# Patient Record
Sex: Female | Born: 1950 | Race: White | Hispanic: No | Smoking: Former smoker
Health system: Southern US, Community
[De-identification: ages and names within clinical notes are randomized; demographics above are authoritative.]

## PROBLEM LIST (undated history)

## (undated) DIAGNOSIS — D510 Vitamin B12 deficiency anemia due to intrinsic factor deficiency: Secondary | ICD-10-CM

## (undated) DIAGNOSIS — K219 Gastro-esophageal reflux disease without esophagitis: Secondary | ICD-10-CM

## (undated) DIAGNOSIS — I7121 Aneurysm of the ascending aorta, without rupture: Secondary | ICD-10-CM

## (undated) DIAGNOSIS — M8580 Other specified disorders of bone density and structure, unspecified site: Secondary | ICD-10-CM

## (undated) DIAGNOSIS — C801 Malignant (primary) neoplasm, unspecified: Secondary | ICD-10-CM

## (undated) DIAGNOSIS — I10 Essential (primary) hypertension: Secondary | ICD-10-CM

## (undated) DIAGNOSIS — D75839 Thrombocytosis, unspecified: Secondary | ICD-10-CM

## (undated) DIAGNOSIS — D473 Essential (hemorrhagic) thrombocythemia: Secondary | ICD-10-CM

## (undated) DIAGNOSIS — E785 Hyperlipidemia, unspecified: Secondary | ICD-10-CM

## (undated) DIAGNOSIS — R002 Palpitations: Secondary | ICD-10-CM

## (undated) DIAGNOSIS — I209 Angina pectoris, unspecified: Secondary | ICD-10-CM

## (undated) DIAGNOSIS — E7800 Pure hypercholesterolemia, unspecified: Secondary | ICD-10-CM

## (undated) DIAGNOSIS — I2510 Atherosclerotic heart disease of native coronary artery without angina pectoris: Secondary | ICD-10-CM

## (undated) DIAGNOSIS — I509 Heart failure, unspecified: Secondary | ICD-10-CM

## (undated) DIAGNOSIS — C439 Malignant melanoma of skin, unspecified: Secondary | ICD-10-CM

## (undated) DIAGNOSIS — R0600 Dyspnea, unspecified: Secondary | ICD-10-CM

## (undated) DIAGNOSIS — I7300 Raynaud's syndrome without gangrene: Secondary | ICD-10-CM

## (undated) DIAGNOSIS — E559 Vitamin D deficiency, unspecified: Secondary | ICD-10-CM

## (undated) DIAGNOSIS — Z8719 Personal history of other diseases of the digestive system: Secondary | ICD-10-CM

## (undated) DIAGNOSIS — M1990 Unspecified osteoarthritis, unspecified site: Secondary | ICD-10-CM

## (undated) DIAGNOSIS — J439 Emphysema, unspecified: Secondary | ICD-10-CM

## (undated) DIAGNOSIS — C50912 Malignant neoplasm of unspecified site of left female breast: Secondary | ICD-10-CM

## (undated) DIAGNOSIS — I712 Thoracic aortic aneurysm, without rupture: Secondary | ICD-10-CM

## (undated) DIAGNOSIS — R079 Chest pain, unspecified: Secondary | ICD-10-CM

## (undated) DIAGNOSIS — D126 Benign neoplasm of colon, unspecified: Secondary | ICD-10-CM

## (undated) HISTORY — PX: MASTECTOMY: SHX3

## (undated) HISTORY — DX: Chest pain, unspecified: R07.9

## (undated) HISTORY — PX: ABDOMINAL HYSTERECTOMY: SHX81

## (undated) HISTORY — PX: APPENDECTOMY: SHX54

## (undated) HISTORY — DX: Malignant neoplasm of unspecified site of left female breast: C50.912

## (undated) HISTORY — DX: Thoracic aortic aneurysm, without rupture: I71.2

## (undated) HISTORY — DX: Pure hypercholesterolemia, unspecified: E78.00

## (undated) HISTORY — DX: Vitamin B12 deficiency anemia due to intrinsic factor deficiency: D51.0

## (undated) HISTORY — PX: TUBAL LIGATION: SHX77

## (undated) HISTORY — DX: Palpitations: R00.2

## (undated) HISTORY — PX: BREAST SURGERY: SHX581

## (undated) HISTORY — DX: Emphysema, unspecified: J43.9

## (undated) HISTORY — PX: OVARIAN CYST SURGERY: SHX726

## (undated) HISTORY — PX: CARDIAC CATHETERIZATION: SHX172

## (undated) HISTORY — PX: BACK SURGERY: SHX140

## (undated) HISTORY — DX: Essential (primary) hypertension: I10

## (undated) HISTORY — DX: Malignant melanoma of skin, unspecified: C43.9

## (undated) HISTORY — DX: Thrombocytosis, unspecified: D75.839

## (undated) HISTORY — DX: Essential (hemorrhagic) thrombocythemia: D47.3

## (undated) HISTORY — DX: Aneurysm of the ascending aorta, without rupture: I71.21

---

## 1993-04-16 DIAGNOSIS — C801 Malignant (primary) neoplasm, unspecified: Secondary | ICD-10-CM

## 1993-04-16 HISTORY — DX: Malignant (primary) neoplasm, unspecified: C80.1

## 2000-10-21 ENCOUNTER — Ambulatory Visit (HOSPITAL_COMMUNITY): Admission: RE | Admit: 2000-10-21 | Discharge: 2000-10-21 | Payer: Self-pay | Admitting: Family Medicine

## 2000-10-21 ENCOUNTER — Encounter: Payer: Self-pay | Admitting: Family Medicine

## 2000-12-13 ENCOUNTER — Encounter: Admission: RE | Admit: 2000-12-13 | Discharge: 2000-12-13 | Payer: Self-pay | Admitting: Oncology

## 2001-12-12 ENCOUNTER — Encounter: Admission: RE | Admit: 2001-12-12 | Discharge: 2001-12-12 | Payer: Self-pay | Admitting: Oncology

## 2002-05-13 ENCOUNTER — Ambulatory Visit (HOSPITAL_COMMUNITY): Admission: RE | Admit: 2002-05-13 | Discharge: 2002-05-13 | Payer: Self-pay | Admitting: Podiatry

## 2002-07-29 ENCOUNTER — Ambulatory Visit (HOSPITAL_COMMUNITY): Admission: RE | Admit: 2002-07-29 | Discharge: 2002-07-29 | Payer: Self-pay | Admitting: Oncology

## 2002-07-29 ENCOUNTER — Encounter (HOSPITAL_COMMUNITY): Payer: Self-pay | Admitting: Oncology

## 2002-12-14 ENCOUNTER — Encounter: Admission: RE | Admit: 2002-12-14 | Discharge: 2002-12-14 | Payer: Self-pay | Admitting: Oncology

## 2003-03-05 ENCOUNTER — Encounter: Admission: RE | Admit: 2003-03-05 | Discharge: 2003-03-05 | Payer: Self-pay | Admitting: Oncology

## 2003-08-09 ENCOUNTER — Encounter: Admission: RE | Admit: 2003-08-09 | Discharge: 2003-08-09 | Payer: Self-pay | Admitting: Oncology

## 2003-08-09 ENCOUNTER — Encounter (HOSPITAL_COMMUNITY): Admission: RE | Admit: 2003-08-09 | Discharge: 2003-09-08 | Payer: Self-pay | Admitting: Oncology

## 2003-09-15 ENCOUNTER — Ambulatory Visit (HOSPITAL_COMMUNITY): Admission: RE | Admit: 2003-09-15 | Discharge: 2003-09-15 | Payer: Self-pay | Admitting: Podiatry

## 2004-03-13 ENCOUNTER — Encounter (HOSPITAL_COMMUNITY): Admission: RE | Admit: 2004-03-13 | Discharge: 2004-04-12 | Payer: Self-pay | Admitting: Oncology

## 2004-03-13 ENCOUNTER — Encounter: Admission: RE | Admit: 2004-03-13 | Discharge: 2004-03-13 | Payer: Self-pay | Admitting: Oncology

## 2004-03-13 ENCOUNTER — Ambulatory Visit (HOSPITAL_COMMUNITY): Payer: Self-pay | Admitting: Oncology

## 2004-04-14 ENCOUNTER — Ambulatory Visit: Payer: Self-pay | Admitting: Internal Medicine

## 2004-04-14 ENCOUNTER — Ambulatory Visit (HOSPITAL_COMMUNITY): Admission: RE | Admit: 2004-04-14 | Discharge: 2004-04-14 | Payer: Self-pay | Admitting: Internal Medicine

## 2004-09-25 ENCOUNTER — Encounter (HOSPITAL_COMMUNITY): Admission: RE | Admit: 2004-09-25 | Discharge: 2004-10-25 | Payer: Self-pay | Admitting: Oncology

## 2004-09-25 ENCOUNTER — Encounter: Admission: RE | Admit: 2004-09-25 | Discharge: 2004-09-25 | Payer: Self-pay | Admitting: Oncology

## 2004-11-16 ENCOUNTER — Ambulatory Visit: Payer: Self-pay | Admitting: Occupational Therapy

## 2005-03-20 ENCOUNTER — Ambulatory Visit (HOSPITAL_COMMUNITY): Payer: Self-pay | Admitting: Oncology

## 2005-03-20 ENCOUNTER — Encounter: Admission: RE | Admit: 2005-03-20 | Discharge: 2005-03-20 | Payer: Self-pay | Admitting: Oncology

## 2005-09-27 ENCOUNTER — Encounter (HOSPITAL_COMMUNITY): Admission: RE | Admit: 2005-09-27 | Discharge: 2005-10-27 | Payer: Self-pay | Admitting: Internal Medicine

## 2005-09-27 ENCOUNTER — Encounter: Admission: RE | Admit: 2005-09-27 | Discharge: 2005-09-27 | Payer: Self-pay | Admitting: Oncology

## 2005-10-10 ENCOUNTER — Encounter (INDEPENDENT_AMBULATORY_CARE_PROVIDER_SITE_OTHER): Payer: Self-pay | Admitting: *Deleted

## 2005-10-10 ENCOUNTER — Ambulatory Visit (HOSPITAL_COMMUNITY): Admission: RE | Admit: 2005-10-10 | Discharge: 2005-10-10 | Payer: Self-pay | Admitting: Podiatry

## 2006-03-19 ENCOUNTER — Ambulatory Visit (HOSPITAL_COMMUNITY): Payer: Self-pay | Admitting: Oncology

## 2006-10-01 ENCOUNTER — Encounter (HOSPITAL_COMMUNITY): Admission: RE | Admit: 2006-10-01 | Discharge: 2006-10-31 | Payer: Self-pay | Admitting: Oncology

## 2007-03-19 ENCOUNTER — Ambulatory Visit (HOSPITAL_COMMUNITY): Payer: Self-pay | Admitting: Oncology

## 2007-10-07 ENCOUNTER — Encounter (HOSPITAL_COMMUNITY): Admission: RE | Admit: 2007-10-07 | Discharge: 2007-11-06 | Payer: Self-pay | Admitting: Oncology

## 2008-04-02 ENCOUNTER — Ambulatory Visit (HOSPITAL_COMMUNITY): Payer: Self-pay | Admitting: Oncology

## 2008-10-11 ENCOUNTER — Ambulatory Visit (HOSPITAL_COMMUNITY): Admission: RE | Admit: 2008-10-11 | Discharge: 2008-10-11 | Payer: Self-pay | Admitting: Oncology

## 2009-03-30 ENCOUNTER — Ambulatory Visit (HOSPITAL_COMMUNITY): Payer: Self-pay | Admitting: Oncology

## 2009-10-20 ENCOUNTER — Ambulatory Visit (HOSPITAL_COMMUNITY): Admission: RE | Admit: 2009-10-20 | Discharge: 2009-10-20 | Payer: Self-pay | Admitting: Oncology

## 2010-03-01 ENCOUNTER — Ambulatory Visit: Payer: Self-pay | Admitting: Genetic Counselor

## 2010-03-29 ENCOUNTER — Encounter (HOSPITAL_COMMUNITY)
Admission: RE | Admit: 2010-03-29 | Discharge: 2010-04-28 | Payer: Self-pay | Source: Home / Self Care | Attending: Oncology | Admitting: Oncology

## 2010-03-29 ENCOUNTER — Ambulatory Visit (HOSPITAL_COMMUNITY): Payer: Self-pay | Admitting: Oncology

## 2010-04-20 ENCOUNTER — Ambulatory Visit (HOSPITAL_COMMUNITY)
Admission: RE | Admit: 2010-04-20 | Discharge: 2010-04-20 | Payer: Self-pay | Source: Home / Self Care | Attending: Internal Medicine | Admitting: Internal Medicine

## 2010-04-20 ENCOUNTER — Ambulatory Visit: Admit: 2010-04-20 | Payer: Self-pay | Admitting: Internal Medicine

## 2010-05-07 ENCOUNTER — Encounter (HOSPITAL_COMMUNITY): Payer: Self-pay | Admitting: Oncology

## 2010-09-01 NOTE — H&P (Signed)
Ashley [name]                   ACCOUNT NO.:  [id number]   MEDICAL RECORD NO.:  [id number]          PATIENT TYPE:  AMB   LOCATION:  DAY                           FACILITY:  [facility]   PHYSICIAN:  Cody M. Ulice Brilliant, D.P.M.  DATE OF BIRTH:  Feb 19, 1951   DATE OF ADMISSION:  DATE OF DISCHARGE:  LH                                HISTORY & PHYSICAL   HISTORY OF PRESENT ILLNESS:  Ashley [name] has a painful lump which has developed on  the dorsal aspect of her right foot over the first metatarsal just proximal  to the first MTP.  This is the site of a previous bunion correction.   PAST MEDICAL HISTORY:  Significant for hypertension for which she takes  Imdur, Cardizem and Avalide.   SHE HAS NO KNOWN DRUG ALLERGIES.   OBJECTIVE:  As stated, about 1-2 cm proximal to the MTP she has what feels  like a ganglionic cyst.  However, the quality of the tissue does not feel  fluid-filled.  It feels relatively hardened.   Radiographs do reveal that one of the K-wires is back out approximately 5-6  mm.   ASSESSMENT:  I think she has likely got an adventitious bursa or ganglion  from the internal fixation which has backed out.   PLAN:  As stated, the above quality of the tissues does not suggest that  this would be amenable to aspiration.  I do not think that would make any  sense whatsoever.  I think her best bet is to have the area opened and the  ganglion or bursa removed as well as removing the internal fixation.  Ashley [name]  asked if we could do that in the office and that is not a possibility.  Will  need to do this under some sedation, monitored anesthesia care at [facility].  We have discussed the procedure and the usual postoperative course.  We will do this through the original skin incision.  Ashley [name] has read the  consent form, apparently understood and signed.                                            ______________________________  Denny Peon. Ulice Brilliant, D.P.M.     CMD/MEDQ  D:  10/09/2005  T:  10/09/2005   Job:  [id number]

## 2010-09-01 NOTE — Op Note (Signed)
NAMEJAMILA, Ashley [name]                   ACCOUNT NO.:  [id number]   MEDICAL RECORD NO.:  [id number]          PATIENT TYPE:  AMB   LOCATION:  DAY                           FACILITY:  [facility]   PHYSICIAN:  Cody M. Ulice Brilliant, D.P.M.  DATE OF BIRTH:  1950/10/07   DATE OF PROCEDURE:  10/10/2005  DATE OF DISCHARGE:                                 OPERATIVE REPORT   PREOPERATIVE DIAGNOSIS:  Ganglionic cyst/adventitious bursa of first  metatarsophalangeal joint dorsally, right foot.  Symptomatic internal  fixation, first metatarsophalangeal joint, right.   POSTOPERATIVE DIAGNOSIS:  Ganglionic cyst/adventitious bursa of first  metatarsophalangeal joint dorsally, right foot.  Symptomatic internal  fixation, first metatarsophalangeal joint, right.   OPERATION PERFORMED:  Excision of ganglion from dorsal aspect of first  metatarsal right foot and removal of symptomatic internal fixation, right  foot.   SURGEON:  Denny Peon. Ulice Brilliant, D.P.M.   ANESTHESIA:  Monitored anesthesia care.   INDICATIONS FOR PROCEDURE:  The patient has had a previous [place]  bunionectomy of the right foot and about a month to two months ago she  started having increased swelling and discomfort along the dorsal aspect of  the foot.  Radiographs were taken and she was noted to have a K-wire which  has worked its way out proximally and is causing a source of irritation.  Decision was made to go ahead and remove the K-wires and take out the bursa  or ganglion.   OPERATIVE FINDINGS:  The patient had an enlarged ganglion which seems to be  originating from one of the two K-wires which has become prominent dorsally.   DESCRIPTION OF PROCEDURE:  Ashley [name] is brought to the operating room and placed  on the table in the supine position.  IV sedation is established.  A Mayo  block was performed about the first MTP of the right foot proximal to the  planned incisional site.  A pneumatic ankle tourniquet was applied across  the right ankle.  Her  foot was prepped and draped in the usual aseptic  fashion.  An Ace bandage was utilized to exsanguinate her foot.  Tourniquet  inflated to 250 mmHg.   PROCEDURE #1  Excision of ganglionic cyst of first metatarsal, right foot.  Attention was directed to the above noted first metatarsal.  The ganglion  was appreciated dorsally.  A 5 cm skin incision was made along the original  skin incision.  The incision was deepened by blunt dissection through  subcutaneous tissues.  The ganglion is appreciated and it is dissected free  from the wound.  Following removal of the ganglion, the K-wire is  appreciated.   PROCEDURE #2  Removal of symptomatic internal fixation.  The first K-wire  was well visualized and was removed with straight hemostat.  The second K-  wire is identified utilizing the [place] and a Therapist, nutritional.  A deep  fascial incision was made into the deep fascia and periosteum.  The K-wire  is identified and removed.  Two horizontal mattress sutures of 3-0 Vicryl  was utilized to close deep fascia.  The  wound was flushed and subcutaneous  tissues were reapproximated and closed with 4-0 Vicryl in a running  horizontal mattress suture.  Skin is closed with 4-0 Vicryl in a running  subcuticular suture.  Postoperative dressing of Steri-Strips.  Postoperative  injection of 0.5 mL of DMP and 2 mL of Marcaine.  Betadine soaked Adaptic  dressing, dry sterile compressive dressing follows.  Tourniquet was deflated  with tourniquet time spanning roughly 25 minutes.   Ashley [name] tolerated the anesthesia and procedure well.  She is transported to  the day hospital.  While there, a list of written instructions were  explained to her.  A prescription for hydrocodone 5/500 and Phenergan is  dispensed.  She will be seen within six days for first postoperative visit.           ______________________________  Denny Peon. Ulice Brilliant, D.P.M.     CMD/MEDQ  D:  10/10/2005  T:  10/10/2005  Job:  [id number]

## 2010-10-04 ENCOUNTER — Other Ambulatory Visit (HOSPITAL_COMMUNITY): Payer: Self-pay | Admitting: Oncology

## 2010-10-04 DIAGNOSIS — Z139 Encounter for screening, unspecified: Secondary | ICD-10-CM

## 2010-10-23 ENCOUNTER — Ambulatory Visit (HOSPITAL_COMMUNITY)
Admission: RE | Admit: 2010-10-23 | Discharge: 2010-10-23 | Disposition: A | Discharge status: Home / Self Care | Payer: Managed Care, Other (non HMO) | Source: Ambulatory Visit | Attending: Oncology | Admitting: Oncology

## 2010-10-23 DIAGNOSIS — Z139 Encounter for screening, unspecified: Secondary | ICD-10-CM

## 2010-10-23 DIAGNOSIS — Z1231 Encounter for screening mammogram for malignant neoplasm of breast: Secondary | ICD-10-CM | POA: Insufficient documentation

## 2010-10-27 ENCOUNTER — Encounter (INDEPENDENT_AMBULATORY_CARE_PROVIDER_SITE_OTHER): Payer: Self-pay | Admitting: Internal Medicine

## 2010-11-06 ENCOUNTER — Encounter (INDEPENDENT_AMBULATORY_CARE_PROVIDER_SITE_OTHER): Payer: Self-pay | Admitting: *Deleted

## 2010-11-28 MED ORDER — SODIUM CHLORIDE 0.45 % IV SOLN
Freq: Once | INTRAVENOUS | Status: AC
  Administered 2010-11-29: 1000 mL via INTRAVENOUS

## 2010-11-29 ENCOUNTER — Other Ambulatory Visit (INDEPENDENT_AMBULATORY_CARE_PROVIDER_SITE_OTHER): Payer: Self-pay | Admitting: Internal Medicine

## 2010-11-29 ENCOUNTER — Encounter (HOSPITAL_COMMUNITY)
Admission: RE | Disposition: A | Discharge status: Home / Self Care | Payer: Self-pay | Source: Ambulatory Visit | Attending: Internal Medicine

## 2010-11-29 ENCOUNTER — Encounter (HOSPITAL_COMMUNITY): Payer: Self-pay | Admitting: *Deleted

## 2010-11-29 ENCOUNTER — Ambulatory Visit (HOSPITAL_COMMUNITY)
Admission: RE | Admit: 2010-11-29 | Discharge: 2010-11-29 | Disposition: A | Discharge status: Home / Self Care | Payer: Managed Care, Other (non HMO) | Source: Ambulatory Visit | Attending: Internal Medicine | Admitting: Internal Medicine

## 2010-11-29 DIAGNOSIS — D126 Benign neoplasm of colon, unspecified: Secondary | ICD-10-CM

## 2010-11-29 DIAGNOSIS — Z853 Personal history of malignant neoplasm of breast: Secondary | ICD-10-CM | POA: Insufficient documentation

## 2010-11-29 DIAGNOSIS — Z860100 Personal history of colon polyps, unspecified: Secondary | ICD-10-CM | POA: Insufficient documentation

## 2010-11-29 DIAGNOSIS — I10 Essential (primary) hypertension: Secondary | ICD-10-CM | POA: Insufficient documentation

## 2010-11-29 DIAGNOSIS — Z79899 Other long term (current) drug therapy: Secondary | ICD-10-CM | POA: Insufficient documentation

## 2010-11-29 DIAGNOSIS — Z800 Family history of malignant neoplasm of digestive organs: Secondary | ICD-10-CM

## 2010-11-29 DIAGNOSIS — K648 Other hemorrhoids: Secondary | ICD-10-CM | POA: Insufficient documentation

## 2010-11-29 DIAGNOSIS — Z7982 Long term (current) use of aspirin: Secondary | ICD-10-CM | POA: Insufficient documentation

## 2010-11-29 HISTORY — DX: Atherosclerotic heart disease of native coronary artery without angina pectoris: I25.10

## 2010-11-29 HISTORY — DX: Essential (primary) hypertension: I10

## 2010-11-29 HISTORY — DX: Malignant (primary) neoplasm, unspecified: C80.1

## 2010-11-29 HISTORY — PX: COLONOSCOPY: SHX5424

## 2010-11-29 HISTORY — DX: Hyperlipidemia, unspecified: E78.5

## 2010-11-29 HISTORY — DX: Benign neoplasm of colon, unspecified: D12.6

## 2010-11-29 SURGERY — COLONOSCOPY
Anesthesia: Moderate Sedation

## 2010-11-29 MED ORDER — MEPERIDINE HCL 50 MG/ML IJ SOLN
INTRAMUSCULAR | Status: DC | PRN
  Administered 2010-11-29: 20 mg via INTRAVENOUS

## 2010-11-29 MED ORDER — MEPERIDINE HCL 100 MG/ML IJ SOLN
INTRAMUSCULAR | Status: AC
  Filled 2010-11-29: qty 1

## 2010-11-29 MED ORDER — MIDAZOLAM HCL 5 MG/5ML IJ SOLN
INTRAMUSCULAR | Status: DC | PRN
  Administered 2010-11-29 (×2): 2 mg via INTRAVENOUS

## 2010-11-29 MED ORDER — MIDAZOLAM HCL 5 MG/5ML IJ SOLN
INTRAMUSCULAR | Status: AC
  Filled 2010-11-29: qty 5

## 2010-11-29 NOTE — Op Note (Signed)
COLONOSCOPY PROCEDURE REPORT  PATIENT:  Ashley [name]  MR#:  [id number] Birthdate:  1950-08-23, 60 y.o., female Endoscopist:  Dr. Malissa Hippo, MD Referred By:Ms. [name] NP Procedure Date: 11/29/2010  Procedure:   Colonoscopy  Indications:  History of colonic polyps. Her last exam she had an adenomas and 2 tubulovillous adenomas removed she was in January this year. 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 Her daughter has been treated for colorectal carcinoma.  Informed Consent: Procedure and risks were reviewed with the patient and informed consent is obtained  Medications:  Demerol 20  mg IV Versed 4  mg IV  Description of procedure:  After a digital rectal exam was performed, that colonoscope was advanced from the anus through the rectum and colon to the area of the cecum, ileocecal valve and appendiceal orifice. The cecum was deeply intubated. These structures were well-seen and photographed for the record. From the level of the cecum and ileocecal valve, the scope was slowly and cautiously withdrawn. The mucosal surfaces were carefully surveyed utilizing scope tip to flexion to facilitate fold flattening as needed. The scope was pulled down into the rectum where a thorough exam including retroflexion was performed.  Findings:  Prep excellent. 10 small polyps were coagulated using snare tip starting from cecum on down to the rectum.  9 mm sessile polyp snared from proximal transverse colon with saline injection. Another 6-7 mm sessile polyp snared from proximal sigmoid colon. Small external hemorrhoids and anal papilla.   Therapeutic/Diagnostic Maneuvers Performed:  See above  Complications:  None  Cecal Withdrawal Time:  24 minutes  Impression:  Examination  performed to cecum. 10 small polyps coagulated using snare tip. 9 mm sessile polyp snared from transverse colon. 6-7 mm sessile polyp snared from sigmoid colon.   Recommendations:  No  aspirin for 10 days Physician will contact you with biopsy results.  Shalom Ware U  11/29/2010 2:52 PM  CC: Ms. [name] NP.

## 2010-11-29 NOTE — H&P (Signed)
Ashley [name] is an 60 y.o. female.   Chief Complaint: For colonoscopy HPI: Patient is 60 year old Caucasian female who has history of colonic adenomas. Most recent exam was in January this year when she had 12 polyps snared and were tubular adenomas and 2 were tubulovillous adenomas. 2 polyps were coagulated and she still has some remaining. Before she is returning to remove rest of the polyps. 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 she denies abdominal pain melena rectal bleeding or change in her bowel habits. Family history significant for colon cancer in her daughter age 16 she had rehabilitation chemoradiation followed by surgery and she is doing fine. Another daughter has celiac disease.  Past Medical History  Diagnosis Date  . Hypertension   . Hyperlipidemia   . Cancer     breast, left  . Coronary artery disease   . Colon adenomas     Past Surgical History  Procedure Date  . Mastectomy     left  . Appendectomy   . Ovarian cyst surgery     x2  . Breast surgery   . Cardiac catheterization   . Tubal ligation     Family History  Problem Relation Age of Onset  . Hypertension Mother   . Heart failure Mother   . Hypertension Father   . Coronary artery disease Father   . Hypertension Sister   . Cancer Other   . Celiac disease Other    Social History:  reports that she has been smoking Cigarettes.  She has a 10 pack-year smoking history. She does not have any smokeless tobacco history on file. She reports that she drinks alcohol. She reports that she does not use illicit drugs.  Allergies: No Known Allergies  Medications Prior to Admission  Medication Dose Route Frequency Provider Last Rate Last Dose  . 0.45 % sodium chloride infusion   Intravenous Once Malissa Hippo, MD 20 mL/hr at 11/29/10 1349 1,000 mL at 11/29/10 1349   Medications Prior to Admission  Medication Sig Dispense Refill  . aspirin 81 MG tablet  Take 81 mg by mouth daily.        . calcium carbonate (OS-CAL) 600 MG TABS Take 600 mg by mouth 2 (two) times daily with a meal.        . diltiazem (CARDIZEM CD) 240 MG 24 hr capsule Take 240 mg by mouth daily.        . furosemide (LASIX) 40 MG tablet Take 40 mg by mouth daily.        . irbesartan-hydrochlorothiazide (AVALIDE) 150-12.5 MG per tablet Take 1 tablet by mouth daily.        . isosorbide mononitrate (IMDUR) 60 MG 24 hr tablet Take 60 mg by mouth daily.        . rosuvastatin (CRESTOR) 40 MG tablet Take 40 mg by mouth daily.          No results found for this or any previous visit (from the past 48 hour(s)). No results found.  Review of Systems  Constitutional: Negative for weight loss.  Gastrointestinal: Negative for abdominal pain, diarrhea, constipation, blood in stool and melena.    Blood pressure 104/66, pulse 66, temperature 98.3 F (36.8 C), temperature source Oral, resp. rate 18, height 5\' 2"  (1.575 m), weight 148 lb (67.132 kg), SpO2 99.00%. Physical Exam  Constitutional: She appears well-developed and well-nourished.  HENT:  Mouth/Throat: Oropharynx is clear and moist.  Eyes:  Conjunctivae are normal. No scleral icterus.  Neck: No thyromegaly present.  Cardiovascular: Normal rate, regular rhythm and normal heart sounds.   No murmur heard. Respiratory: Breath sounds normal.  GI: Soft. She exhibits no distension and no mass. There is no tenderness.  Musculoskeletal: She exhibits no edema.  Neurological: She is alert.  Skin: Skin is warm and dry.     Assessment/Plan History of colonic polyps. Number history of colon carcinoma. For colonoscopy  Theophil Thivierge U 11/29/2010, 2:00 PM

## 2010-12-05 ENCOUNTER — Encounter (INDEPENDENT_AMBULATORY_CARE_PROVIDER_SITE_OTHER): Payer: Self-pay | Admitting: *Deleted

## 2010-12-05 ENCOUNTER — Encounter (HOSPITAL_COMMUNITY): Payer: Self-pay | Admitting: Internal Medicine

## 2011-03-28 ENCOUNTER — Ambulatory Visit (HOSPITAL_COMMUNITY): Payer: Self-pay | Admitting: Oncology

## 2011-03-28 ENCOUNTER — Encounter (HOSPITAL_COMMUNITY): Payer: Self-pay | Admitting: Oncology

## 2011-03-28 ENCOUNTER — Encounter (HOSPITAL_COMMUNITY): Payer: Managed Care, Other (non HMO) | Attending: Oncology | Admitting: Oncology

## 2011-03-28 DIAGNOSIS — Z853 Personal history of malignant neoplasm of breast: Secondary | ICD-10-CM

## 2011-03-28 DIAGNOSIS — F17200 Nicotine dependence, unspecified, uncomplicated: Secondary | ICD-10-CM

## 2011-03-28 DIAGNOSIS — C50919 Malignant neoplasm of unspecified site of unspecified female breast: Secondary | ICD-10-CM

## 2011-03-28 DIAGNOSIS — J449 Chronic obstructive pulmonary disease, unspecified: Secondary | ICD-10-CM

## 2011-03-28 NOTE — Progress Notes (Signed)
CC:   [name], FNP Lionel December, M.D.  DIAGNOSES: 1. 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  She     had reduction surgery on the right and has been disease-free since     her surgery.  She is BRCA1 and BRCA2 negative. 2. Chronic obstructive pulmonary disease, though she is still smoking. 3. Vasospastic coronary artery disease, status post therapeutic     intervention by Dr. Johann Capers, but she has been released. 4. Mild elevation in her platelets and white cells but with a negative     bone marrow aspiration and biopsy, negative flow cytometry and     cytogenetics in November 2000.  Her counts remain stable.  INTERVAL HISTORY:  Shreeya only issues are revolved around her 2 daughters who have different illnesses that she helps them with.  Odelia is working full-time and doing well but, unfortunately, smoking.  She started smoking again with the stress of her daughters' illness, namely cancer of the rectum.  Otherwise, Janelly is fine except for these white count and platelet elevations which are stable and we are just watching them.  PHYSICAL EXAMINATION:  Vital Signs:  She has a weight of 157 pounds and that corresponds to the same weight essentially in 2009 (3 years ago). She has a normal blood pressure of 92/62 in right arm and sitting position.  Pulse 80 and regular.  Respirations 16-18 and unlabored.  She is afebrile.  General Appearance:  She is in no pain.  Breast Exam:  The right breast is negative for masses and only surgical changes are present.  The left reconstructed breast is negative.  Lymphs:  There is no adenopathy.  Neck:  No thyromegaly.  Lungs:  Clear but with diminished breath sounds throughout.  Heart:  Regular rhythm and rate without murmur, rub, or gallop.  Abdomen:  Soft and nontender  without organomegaly.  Extremities:  She has no peripheral edema.  PLAN:  She is up-to-date on colonoscopy, mammography, and bone density, she states.  She probably needs a low-dose CT since she has a greater than 30 pack year history of smoking.  We probably need to do that for 3 years.  She is willing to proceed with that.  We will see her back one way or the other in a year, but I do want her to do a CBC and diff when she sees [place] in a year but also will have her do a CBC diff at 6 months.    ______________________________ Ladona Horns. Mariel Sleet, MD ESN/MEDQ  D:  03/28/2011  T:  03/28/2011  Job:  [id number]

## 2011-03-28 NOTE — Patient Instructions (Signed)
Ashley [name] [employer]  [id number] 11-14-1950  [facility]  Discharge Instructions  RECOMMENDATIONS MADE BY THE CONSULTANT AND ANY TEST RESULTS WILL BE SENT TO YOUR REFERRING DOCTOR.   EXAM FINDINGS BY MD TODAY AND SIGNS AND SYMPTOMS TO REPORT TO CLINIC OR PRIMARY MD: You are doing great.  Report any lumps, bone pain or shortness of breath.  MEDICATIONS PRESCRIBED: none      SPECIAL INSTRUCTIONS/FOLLOW-UP: Return to Clinic in 1 year.   I acknowledge that I have been informed and understand all the instructions given to me and received a copy. I do not have any more questions at this time, but understand that I may call the Specialty Clinic at [facility] at [phone] during business hours should I have any further questions or need assistance in obtaining follow-up care.    __________________________________________  _____________  __________ Signature of Patient or Authorized Representative            Date                   Time    __________________________________________ Nurse's Signature

## 2011-04-30 ENCOUNTER — Ambulatory Visit (HOSPITAL_COMMUNITY)
Admission: RE | Admit: 2011-04-30 | Discharge: 2011-04-30 | Disposition: A | Discharge status: Home / Self Care | Payer: Managed Care, Other (non HMO) | Source: Ambulatory Visit | Attending: Oncology | Admitting: Oncology

## 2011-04-30 ENCOUNTER — Encounter (HOSPITAL_COMMUNITY): Payer: Self-pay

## 2011-04-30 DIAGNOSIS — J449 Chronic obstructive pulmonary disease, unspecified: Secondary | ICD-10-CM

## 2011-04-30 DIAGNOSIS — F17200 Nicotine dependence, unspecified, uncomplicated: Secondary | ICD-10-CM | POA: Insufficient documentation

## 2011-04-30 DIAGNOSIS — C50919 Malignant neoplasm of unspecified site of unspecified female breast: Secondary | ICD-10-CM | POA: Insufficient documentation

## 2011-04-30 DIAGNOSIS — R918 Other nonspecific abnormal finding of lung field: Secondary | ICD-10-CM | POA: Insufficient documentation

## 2011-05-01 NOTE — Progress Notes (Signed)
Spoke with pt. CT Scan results given as per Dr.Neijstrom's instructions. Pt verbalizes understanding.

## 2011-12-11 ENCOUNTER — Other Ambulatory Visit (HOSPITAL_COMMUNITY): Payer: Self-pay | Admitting: Nurse Practitioner

## 2011-12-11 DIAGNOSIS — R222 Localized swelling, mass and lump, trunk: Secondary | ICD-10-CM

## 2011-12-13 ENCOUNTER — Other Ambulatory Visit (HOSPITAL_COMMUNITY): Payer: Managed Care, Other (non HMO)

## 2011-12-14 ENCOUNTER — Ambulatory Visit (HOSPITAL_COMMUNITY)
Admission: RE | Admit: 2011-12-14 | Discharge: 2011-12-14 | Disposition: A | Discharge status: Home / Self Care | Payer: Managed Care, Other (non HMO) | Source: Ambulatory Visit | Attending: Nurse Practitioner | Admitting: Nurse Practitioner

## 2011-12-14 ENCOUNTER — Other Ambulatory Visit (HOSPITAL_COMMUNITY): Payer: Self-pay | Admitting: Nurse Practitioner

## 2011-12-14 DIAGNOSIS — R911 Solitary pulmonary nodule: Secondary | ICD-10-CM | POA: Insufficient documentation

## 2011-12-14 DIAGNOSIS — Z139 Encounter for screening, unspecified: Secondary | ICD-10-CM

## 2011-12-14 DIAGNOSIS — R222 Localized swelling, mass and lump, trunk: Secondary | ICD-10-CM

## 2011-12-14 DIAGNOSIS — Z09 Encounter for follow-up examination after completed treatment for conditions other than malignant neoplasm: Secondary | ICD-10-CM | POA: Insufficient documentation

## 2011-12-14 MED ORDER — IOHEXOL 300 MG/ML  SOLN
80.0000 mL | Freq: Once | INTRAMUSCULAR | Status: AC | PRN
  Administered 2011-12-14: 80 mL via INTRAVENOUS

## 2011-12-20 ENCOUNTER — Telehealth (HOSPITAL_COMMUNITY): Payer: Self-pay | Admitting: *Deleted

## 2011-12-20 NOTE — Telephone Encounter (Signed)
Patient states that [name] will schedule follow up ct scans

## 2011-12-21 ENCOUNTER — Ambulatory Visit (HOSPITAL_COMMUNITY)
Admission: RE | Admit: 2011-12-21 | Discharge: 2011-12-21 | Disposition: A | Discharge status: Home / Self Care | Payer: Managed Care, Other (non HMO) | Source: Ambulatory Visit | Attending: Nurse Practitioner | Admitting: Nurse Practitioner

## 2011-12-21 DIAGNOSIS — Z1231 Encounter for screening mammogram for malignant neoplasm of breast: Secondary | ICD-10-CM | POA: Insufficient documentation

## 2011-12-21 DIAGNOSIS — Z139 Encounter for screening, unspecified: Secondary | ICD-10-CM

## 2012-03-26 ENCOUNTER — Ambulatory Visit (HOSPITAL_COMMUNITY): Payer: Managed Care, Other (non HMO) | Admitting: Oncology

## 2012-03-31 ENCOUNTER — Encounter (HOSPITAL_COMMUNITY): Payer: BC Managed Care – PPO | Attending: Oncology | Admitting: Oncology

## 2012-03-31 ENCOUNTER — Encounter (HOSPITAL_COMMUNITY): Payer: Self-pay | Admitting: Oncology

## 2012-03-31 VITALS — BP 97/71 | HR 79 | Temp 97.8°F | Resp 18 | Wt 151.1 lb

## 2012-03-31 DIAGNOSIS — Z853 Personal history of malignant neoplasm of breast: Secondary | ICD-10-CM

## 2012-03-31 DIAGNOSIS — J449 Chronic obstructive pulmonary disease, unspecified: Secondary | ICD-10-CM

## 2012-03-31 DIAGNOSIS — C50919 Malignant neoplasm of unspecified site of unspecified female breast: Secondary | ICD-10-CM

## 2012-03-31 DIAGNOSIS — F17200 Nicotine dependence, unspecified, uncomplicated: Secondary | ICD-10-CM

## 2012-03-31 NOTE — Progress Notes (Signed)
Problem #1 stage II adenocarcinoma of the breast with surgery on 01/02/1994 consisting of a modified radical mastectomy on the left followed by Adriamycin and Cytoxan x6 cycles followed by reconstruction. She also had reduction surgery in the right breast has remained disease-free since her surgery. She is BRCA1 or BRCA2 negative Problem #2 COPD still smoking a few cigarettes a day but with benign appearing pulmonary nodules better in need of followup and she states that her primary care person will take care of scheduling the CT scan soon. Problem #3 vasospastic coronary artery disease status post therapeutic intervention by Dr. Bonnee Quin years ago Problem #4 mild elevation in her platelets and white cells but with a negative bone marrow aspiration and biopsy in November 2000 and her blood counts remained stable since with the same essential elevations. We are watching this every 6 months. She has done well since I've seen her. She brought her lab work in from 03/26/2012 and once again white count is mildly elevated at 12,800 with a predominance of neutrophils and a rare increase in eosinophils to 500. Neutrophils absolute are 9500. Eosinophils only 500. Platelets are still mildly elevated at 684,000 and her hemoglobin, hematocrit, MCV remain normal.  She has no symptoms of fevers chills night sweats and she is fully functional and works full-time.  Vital signs are very stable. Weight is up' 6 pounds however in one year. She has no lymphadenopathy. Left reconstructed breast is negative. The right breast is negative for fibroglandular changes throughout. Her lungs are clear but with diminished breath sounds throughout. Heart shows a regular rhythm and rate without murmur rub or gallop. Abdomen is soft and nontender without organomegaly. She has no peripheral edema.  We'll see her back in one year sooner if need be. If there is anything wrong with the CAT scan when she gets the results she will let us  know

## 2012-03-31 NOTE — Patient Instructions (Addendum)
[facility] Discharge Instructions  RECOMMENDATIONS MADE BY THE CONSULTANT AND ANY TEST RESULTS WILL BE SENT TO YOUR REFERRING PHYSICIAN.  EXAM FINDINGS BY THE PHYSICIAN TODAY AND SIGNS OR SYMPTOMS TO REPORT TO CLINIC OR PRIMARY PHYSICIAN: exam and discussion by MD.  No evidence of recurrence by exam.  Have any labs done sent to [place].  MEDICATIONS PRESCRIBED:  none  INSTRUCTIONS GIVEN AND DISCUSSED: Report any new lumps, bone pain or shortness of breath.  SPECIAL INSTRUCTIONS/FOLLOW-UP: 1 year to see MD.  Thank you for choosing [facility] to provide your oncology and hematology care.  To afford each patient quality time with our providers, please arrive at least 15 minutes before your scheduled appointment time.  With your help, our goal is to use those 15 minutes to complete the necessary work-up to ensure our physicians have the information they need to help with your evaluation and healthcare recommendations.    Effective January 1st, 2014, we ask that you re-schedule your appointment with our physicians should you arrive 10 or more minutes late for your appointment.  We strive to give you quality time with our providers, and arriving late affects you and other patients whose appointments are after yours.    Again, thank you for choosing [facility].  Our hope is that these requests will decrease the amount of time that you wait before being seen by our physicians.       _____________________________________________________________  I acknowledge that I have been informed and understand all the instructions given to me and received a copy. I do not have anymore questions at this time but understand that I may call the Cancer Center at [facility] at [phone] during business hours should I have any further questions or need assistance in obtaining follow-up care.    __________________________________________  _____________   __________ Signature of Patient or Authorized Representative            Date                   Time    __________________________________________ Nurse's Signature

## 2012-06-13 ENCOUNTER — Telehealth (HOSPITAL_COMMUNITY): Payer: Self-pay

## 2012-06-13 DIAGNOSIS — R7989 Other specified abnormal findings of blood chemistry: Secondary | ICD-10-CM

## 2012-06-13 NOTE — Telephone Encounter (Signed)
Patient notified that Dr. Mariel Sleet wants her to come for blood work on Monday and to see him in clinic once test results are available.  Appointments made and patient verbalized understanding.

## 2012-06-16 ENCOUNTER — Encounter (HOSPITAL_COMMUNITY): Payer: Managed Care, Other (non HMO) | Attending: Oncology

## 2012-06-16 DIAGNOSIS — D473 Essential (hemorrhagic) thrombocythemia: Secondary | ICD-10-CM | POA: Insufficient documentation

## 2012-06-16 LAB — CBC WITH DIFFERENTIAL/PLATELET
Basophils Absolute: 0 10*3/uL (ref 0.0–0.1)
Basophils Relative: 0 % (ref 0–1)
Eosinophils Absolute: 0.3 10*3/uL (ref 0.0–0.7)
Eosinophils Relative: 2 % (ref 0–5)
Lymphocytes Relative: 18 % (ref 12–46)
MCH: 28.8 pg (ref 26.0–34.0)
MCHC: 35 g/dL (ref 30.0–36.0)
MCV: 82.1 fL (ref 78.0–100.0)
Platelets: 837 10*3/uL — ABNORMAL HIGH (ref 150–400)
RDW: 15.4 % (ref 11.5–15.5)
WBC: 18.2 10*3/uL — ABNORMAL HIGH (ref 4.0–10.5)

## 2012-06-16 NOTE — Progress Notes (Signed)
Labs drawn today for cbc/diff,JAK2,smear

## 2012-06-24 ENCOUNTER — Telehealth (HOSPITAL_COMMUNITY): Payer: Self-pay

## 2012-06-24 NOTE — Telephone Encounter (Signed)
Call from patient wants to know if she still needs to see MD on 3/14 since JAK 2 was negative?

## 2012-06-27 ENCOUNTER — Ambulatory Visit (HOSPITAL_COMMUNITY): Payer: Managed Care, Other (non HMO) | Admitting: Oncology

## 2012-07-01 ENCOUNTER — Encounter (HOSPITAL_BASED_OUTPATIENT_CLINIC_OR_DEPARTMENT_OTHER): Payer: Managed Care, Other (non HMO)

## 2012-07-01 DIAGNOSIS — Z853 Personal history of malignant neoplasm of breast: Secondary | ICD-10-CM

## 2012-07-01 NOTE — Progress Notes (Signed)
Labs drawn today for Bcr/abl

## 2012-07-03 LAB — BCR/ABL GENE REARRANGEMENT QNT, PCR

## 2012-07-08 ENCOUNTER — Ambulatory Visit (HOSPITAL_COMMUNITY): Payer: Managed Care, Other (non HMO) | Admitting: Oncology

## 2012-07-15 ENCOUNTER — Encounter (HOSPITAL_COMMUNITY): Payer: Managed Care, Other (non HMO) | Attending: Oncology | Admitting: Oncology

## 2012-07-15 VITALS — BP 100/66 | HR 73 | Resp 12

## 2012-07-15 DIAGNOSIS — Z853 Personal history of malignant neoplasm of breast: Secondary | ICD-10-CM | POA: Insufficient documentation

## 2012-07-15 DIAGNOSIS — D72829 Elevated white blood cell count, unspecified: Secondary | ICD-10-CM

## 2012-07-15 DIAGNOSIS — D473 Essential (hemorrhagic) thrombocythemia: Secondary | ICD-10-CM

## 2012-07-15 HISTORY — PX: BONE MARROW ASPIRATION: SHX1252

## 2012-07-15 HISTORY — PX: BONE MARROW BIOPSY: SHX199

## 2012-07-15 LAB — CBC WITH DIFFERENTIAL/PLATELET
HCT: 39.6 % (ref 36.0–46.0)
Hemoglobin: 13.7 g/dL (ref 12.0–15.0)
Lymphocytes Relative: 19 % (ref 12–46)
Monocytes Absolute: 0.8 10*3/uL (ref 0.1–1.0)
Monocytes Relative: 6 % (ref 3–12)
Neutro Abs: 10.4 10*3/uL — ABNORMAL HIGH (ref 1.7–7.7)
Neutrophils Relative %: 74 % (ref 43–77)
RBC: 4.75 MIL/uL (ref 3.87–5.11)
WBC: 14.1 10*3/uL — ABNORMAL HIGH (ref 4.0–10.5)

## 2012-07-15 MED ORDER — HYDROMORPHONE HCL PF 1 MG/ML IJ SOLN
1.0000 mg | INTRAMUSCULAR | Status: AC
  Administered 2012-07-15: 1 mg via INTRAMUSCULAR

## 2012-07-15 MED ORDER — HYDROMORPHONE HCL PF 1 MG/ML IJ SOLN
INTRAMUSCULAR | Status: AC
  Filled 2012-07-15: qty 1

## 2012-07-15 NOTE — Patient Instructions (Addendum)
[facility] Discharge Instructions  RECOMMENDATIONS MADE BY THE CONSULTANT AND ANY TEST RESULTS WILL BE SENT TO YOUR REFERRING PHYSICIAN.  EXAM FINDINGS BY THE PHYSICIAN TODAY AND SIGNS OR SYMPTOMS TO REPORT TO CLINIC OR PRIMARY PHYSICIAN: You had a bone marrow biopsy and aspiration today.  We should have all results back within 2 weeks.  If bleeding occurs apply direct pressure to site for 10 minutes.  If unable to stop the bleeding go to the ED.  You can take tylenol if needed for discomfort.  MEDICATIONS PRESCRIBED:  none   SPECIAL INSTRUCTIONS/FOLLOW-UP: Return for follow-up on 07/29/12.  Thank you for choosing [name] to provide your oncology and hematology care.  To afford each patient quality time with our providers, please arrive at least 15 minutes before your scheduled appointment time.  With your help, our goal is to use those 15 minutes to complete the necessary work-up to ensure our physicians have the information they need to help with your evaluation and healthcare recommendations.    Effective January 1st, 2014, we ask that you re-schedule your appointment with our physicians should you arrive 10 or more minutes late for your appointment.  We strive to give you quality time with our providers, and arriving late affects you and other patients whose appointments are after yours.    Again, thank you for choosing [facility].  Our hope is that these requests will decrease the amount of time that you wait before being seen by our physicians.       _____________________________________________________________  Should you have questions after your visit to [facility], please contact our office at [phone] between the hours of 8:30 a.m. and 5:00 p.m.  Voicemails left after 4:30 p.m. will not be returned until the following business day.  For prescription refill requests, have your pharmacy contact our office with your  prescription refill request.     Bone Marrow Aspiration, Bone Marrow Biopsy Care After Read the instructions outlined below and refer to this sheet in the next few weeks. These discharge instructions provide you with general information on caring for yourself after you leave the hospital. Your caregiver may also give you specific instructions. While your treatment has been planned according to the most current medical practices available, unavoidable complications occasionally occur. If you have any problems or questions after discharge, call your caregiver. FINDING OUT THE RESULTS OF YOUR TEST Not all test results are available during your visit. If your test results are not back during the visit, make an appointment with your caregiver to find out the results. Do not assume everything is normal if you have not heard from your caregiver or the medical facility. It is important for you to follow up on all of your test results.  HOME CARE INSTRUCTIONS  You have had sedation and may be sleepy or dizzy. Your thinking may not be as clear as usual. For the next 24 hours:  Only take over-the-counter or prescription medicines for pain, discomfort, and or fever as directed by your caregiver.  Do not drink alcohol.  Do not smoke.  Do not drive.  Do not make important legal decisions.  Do not operate heavy machinery.  Do not care for small children by yourself.  Keep your dressing clean and dry. You may replace dressing with a bandage after 24 hours.  You may take a bath or shower after 24 hours.  Use an ice pack for 20  minutes every 2 hours while awake for pain as needed. SEEK MEDICAL CARE IF:   There is redness, swelling, or increasing pain at the biopsy site.  There is pus coming from the biopsy site.  There is drainage from a biopsy site lasting longer than one day.  An unexplained oral temperature above 102 F (38.9 C) develops. SEEK IMMEDIATE MEDICAL CARE IF:   You develop a  rash.  You have difficulty breathing.  You develop any reaction or side effects to medications given. Document Released: 10/20/2004 Document Revised: 06/25/2011 Document Reviewed: 03/30/2008 [facility] Patient Information 2013 [place], [place].

## 2012-07-15 NOTE — Progress Notes (Signed)
This lady presents for bone marrow aspiration and biopsy. She has a history of breast cancer many years ago treated with chemotherapy. Over the last number of years she's had a rising white count and now a more elevated so the count than in the past. She used to be around 500,000 in our plates are over 800,000. Her white count differential still shows a predominance of neutrophils but no blasts are seen in the peripheral smear. She does not have a Jak-2 genotype either. She also is BCR/ABL negative Therefore he recommended a bone marrow aspirate and biopsy after her change in blood counts. Informed consent was obtained. She was placed in the prone position. Both posterior superior iliac spine his processes were identified and the right was chosen. 3 Betadine swabs were applied for sterility. She then received 12 cc of 2% plain Xylocaine for local anesthesia. A bone marrow aspirate x2 was obtained and sent for routine pathology as well as flow cytometry and cytogenetics. A bone marrow biopsy was then obtained without incident.  Results are pending but we will discuss him with her in 2 weeks. She was given 1 mg of Dilaudid IM prior to the procedure since she forgot to take any premeds before coming here.

## 2012-07-15 NOTE — Progress Notes (Signed)
[name] presents today for injection per MD orders. Dilaudid administered im in right Gluteal @0842  Administration without incident. Patient tolerated well.  [facility] BONE MARROW BIOPSY/ASPIRATE PROGRESS NOTE  [name] presents for Bone Marrow biopsy per MD orders. [name] verbalized understanding of procedure. Dilaudid 1 mg given IM Right hip. Consent reviewed and signed on 07/01/12. [name] positioned supine for procedure. Time-out performed and Bone Marrow Checklist. Procedure began at 0850. Xylocaine 2% 10 cc used for local and administered to patient by Dr. Mariel Sleet. Procedure completed at [phone]. Patient tolerated well. Pressure dressing applied to the right hip with instructions to leave in place for 24 hours. Patient instructed to report any bleeding that saturates dressing and to take pain medication tylenol as directed. Dressing dry and intact to the right hip on discharge.

## 2012-07-16 ENCOUNTER — Encounter: Payer: Self-pay | Admitting: Oncology

## 2012-07-22 ENCOUNTER — Telehealth (HOSPITAL_COMMUNITY): Payer: Self-pay

## 2012-07-22 NOTE — Telephone Encounter (Signed)
Patient notified that we only have preliminary report for Bone Marrow Biopsy.  Per Dr. Mariel Sleet there is no evidence of cancer at this point.

## 2012-07-22 NOTE — Telephone Encounter (Signed)
Call from patient wanting to know if we have anything on her bone marrow biopsy yet?

## 2012-07-29 ENCOUNTER — Encounter (HOSPITAL_COMMUNITY): Payer: Self-pay | Admitting: Oncology

## 2012-07-29 ENCOUNTER — Encounter (HOSPITAL_BASED_OUTPATIENT_CLINIC_OR_DEPARTMENT_OTHER): Payer: Managed Care, Other (non HMO) | Admitting: Oncology

## 2012-07-29 DIAGNOSIS — D473 Essential (hemorrhagic) thrombocythemia: Secondary | ICD-10-CM

## 2012-07-29 MED ORDER — HYDROXYUREA 500 MG PO CAPS: 500.0000 mg | ORAL_CAPSULE | Freq: Every day | ORAL | Status: DC

## 2012-07-29 NOTE — Progress Notes (Signed)
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 I will initiate hydroxyurea 500 mg once a day and continue her 81 milligram aspirin (coated). We will over the drug today and how well it's tolerated. I also went over her bone marrow biopsy in detail. There is no evidence for iron deficiency indices thrombocytosis, CML, etc. 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  Her other problems are listed in my note of 03/31/2012.  We'll see her in 3 weeks with a blood count. I will see her myself in 3 months. She'll call for any issues

## 2012-07-29 NOTE — Patient Instructions (Addendum)
[facility] Discharge Instructions  RECOMMENDATIONS MADE BY THE CONSULTANT AND ANY TEST RESULTS WILL BE SENT TO YOUR REFERRING PHYSICIAN.  EXAM FINDINGS BY THE PHYSICIAN TODAY AND SIGNS OR SYMPTOMS TO REPORT TO CLINIC OR PRIMARY PHYSICIAN: Discussion by MD.  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  MEDICATIONS PRESCRIBED:  Hydrea take 1 capsule daily.  INSTRUCTIONS GIVEN AND DISCUSSED: Report fevers, infections, unusual bruising or bleeding.  SPECIAL INSTRUCTIONS/FOLLOW-UP: Blood work in 1 month and to see MD in 3 months.  Thank you for choosing [facility] to provide your oncology and hematology care.  To afford each patient quality time with our providers, please arrive at least 15 minutes before your scheduled appointment time.  With your help, our goal is to use those 15 minutes to complete the necessary work-up to ensure our physicians have the information they need to help with your evaluation and healthcare recommendations.    Effective January 1st, 2014, we ask that you re-schedule your appointment with our physicians should you arrive 10 or more minutes late for your appointment.  We strive to give you quality time with our providers, and arriving late affects you and other patients whose appointments are after yours.    Again, thank you for choosing [facility].  Our hope is that these requests will decrease the amount of time that you wait before being seen by our physicians.       _____________________________________________________________  Should you have questions after your visit to [facility], please contact our office at [phone] between the hours of 8:30 a.m. and 5:00 p.m.  Voicemails left after 4:30 p.m. will not be returned until the following business day.  For prescription refill requests, have your pharmacy contact our office with your prescription refill request.      Hydroxyurea capsules What is this medicine? HYDROXYUREA (hye drox ee yoor EE a) is a chemotherapy drug. It slows the growth of cancer cells. This medicine is used to treat certain leukemias, skin cancer, head and neck cancer, and advanced ovarian cancer. It is also used to control the painful crises of sickle cell anemia. This medicine may be used for other purposes; ask your health care provider or pharmacist if you have questions. What should I tell my health care provider before I take this medicine? They need to know if you have any of these conditions: -immune system problems -infection (especially a virus infection such as chickenpox, cold sores, or herpes) -kidney disease -low blood counts, like low white cell, platelet, or red cell counts -previous or ongoing radiation therapy -an unusual or allergic reaction to hydroxyurea, other chemotherapy, other medicines, foods, dyes, or preservatives -pregnant or trying to get pregnant -breast-feeding How should I use this medicine? Take this medicine by mouth with a glass of water. Follow the directions on the prescription label. Take your medicine at regular intervals. Do not take it more often than directed. Do not stop taking except on your doctor's advice. People who are not taking this medicine should not be exposed to it. Wash your hands before and after handling your bottle or medicine. Caregivers should wear disposable gloves if they must touch the bottle or medicine. Clean up any medicine powder that spills with a damp disposable towel and throw the towel away in a closed container, such as a plastic bag. Talk to your pediatrician regarding the use of this medicine in children. Special care  may be needed. Patients over 39 years old may have a stronger reaction and need a smaller dose. Overdosage: If you think you have taken too much of this medicine contact a poison control center or emergency room at once. NOTE: This medicine is  only for you. Do not share this medicine with others. What if I miss a dose? If you miss a dose, take it as soon as you can. If it is almost time for your next dose, take only that dose. Do not take double or extra doses. What may interact with this medicine? -didanosine -other chemotherapy agents -stavudine -tenofovir -vaccines This list may not describe all possible interactions. Give your health care provider a list of all the medicines, herbs, non-prescription drugs, or dietary supplements you use. Also tell them if you smoke, drink alcohol, or use illegal drugs. Some items may interact with your medicine. What should I watch for while using this medicine? This drug may make you feel generally unwell. This is not uncommon, as chemotherapy can affect healthy cells as well as cancer cells. Report any side effects. Continue your course of treatment even though you feel ill unless your doctor tells you to stop. You will receive regular blood tests during your treatment. Call your doctor or health care professional for advice if you get a fever, chills or sore throat, or other symptoms of a cold or flu. Do not treat yourself. This drug decreases your body's ability to fight infections. Try to avoid being around people who are sick. This medicine may increase your risk to bruise or bleed. Call your doctor or health care professional if you notice any unusual bleeding. Be careful brushing and flossing your teeth or using a toothpick because you may get an infection or bleed more easily. If you have any dental work done, tell your dentist you are receiving this medicine. Avoid taking products that contain aspirin, acetaminophen, ibuprofen, naproxen, or ketoprofen unless instructed by your doctor. These medicines may hide a fever. Do not become pregnant while taking this medicine. Women should inform their doctor if they wish to become pregnant or think they might be pregnant. There is a potential for  serious side effects to an unborn child. Men should inform their doctors if they wish to father a child. This medicine may lower sperm counts. Talk to your health care professional or pharmacist for more information. Do not breast-feed an infant while taking this medicine. What side effects may I notice from receiving this medicine? Side effects that you should report to your doctor or health care professional as soon as possible: -allergic reactions like skin rash, itching or hives, swelling of the face, lips, or tongue -low blood counts - this medicine may decrease the number of white blood cells, red blood cells and platelets. You may be at increased risk for infections and bleeding. -signs of infection - fever or chills, cough, sore throat, pain or difficulty passing urine -signs of decreased platelets or bleeding - bruising, pinpoint red spots on the skin, black, tarry stools, blood in the urine -signs of decreased red blood cells - unusually weak or tired, fainting spells, lightheadedness -breathing problems -burning, redness or pain at the site of any radiation therapy -changes in skin color -confusion -mouth sores -pain, tingling, numbness in the hands or feet -seizures -skin ulcers -trouble passing urine or change in the amount of urine -vomiting Side effects that usually do not require medical attention (report to your doctor or health care professional if  they continue or are bothersome): -headache -loss of appetite -red color to the face This list may not describe all possible side effects. Call your doctor for medical advice about side effects. You may report side effects to FDA at 1-800-FDA-1088. Where should I keep my medicine? Keep out of the reach of children. Store at room temperature between 15 and 30 degrees C (59 and 86 degrees F). Keep tightly closed. Throw away any unused medicine after the expiration date. NOTE: This sheet is a summary. It may not cover all possible  information. If you have questions about this medicine, talk to your doctor, pharmacist, or health care provider.  2013, [employer]. (08/15/2007 3:03:29 PM)

## 2012-08-18 ENCOUNTER — Encounter: Payer: Self-pay | Admitting: Oncology

## 2012-08-19 ENCOUNTER — Encounter (HOSPITAL_COMMUNITY): Payer: Managed Care, Other (non HMO) | Attending: Oncology

## 2012-08-19 DIAGNOSIS — D473 Essential (hemorrhagic) thrombocythemia: Secondary | ICD-10-CM

## 2012-08-19 LAB — CBC WITH DIFFERENTIAL/PLATELET
Basophils Absolute: 0.1 10*3/uL (ref 0.0–0.1)
Eosinophils Relative: 3 % (ref 0–5)
HCT: 40.3 % (ref 36.0–46.0)
Hemoglobin: 13.9 g/dL (ref 12.0–15.0)
Lymphocytes Relative: 21 % (ref 12–46)
MCHC: 34.5 g/dL (ref 30.0–36.0)
MCV: 85.2 fL (ref 78.0–100.0)
Monocytes Absolute: 0.7 10*3/uL (ref 0.1–1.0)
Monocytes Relative: 6 % (ref 3–12)
RDW: 17.4 % — ABNORMAL HIGH (ref 11.5–15.5)
WBC: 12.6 10*3/uL — ABNORMAL HIGH (ref 4.0–10.5)

## 2012-08-19 NOTE — Progress Notes (Unsigned)
Labs drawn today for cbc/diff 

## 2012-09-12 ENCOUNTER — Other Ambulatory Visit (HOSPITAL_COMMUNITY): Payer: Self-pay

## 2012-09-12 ENCOUNTER — Telehealth (HOSPITAL_COMMUNITY): Payer: Self-pay

## 2012-09-12 DIAGNOSIS — D473 Essential (hemorrhagic) thrombocythemia: Secondary | ICD-10-CM

## 2012-09-12 MED ORDER — HYDROXYUREA 500 MG PO CAPS: ORAL_CAPSULE | ORAL | Status: DC

## 2012-09-12 NOTE — Telephone Encounter (Signed)
Per Dr. Thornton Papas orders patient notified to increase Hydrea to 500mg  twice daily Thursdays, Fridays, Saturdays and Sundays and 1 daily on Mondays, Tuesdays and Wednesdays.  To get CBC in 3 weeks.  Prescription reflecting change called to [name].  Patient verbalized understanding of instructions.

## 2012-10-02 ENCOUNTER — Encounter: Payer: Self-pay | Admitting: Oncology

## 2012-10-08 ENCOUNTER — Telehealth (HOSPITAL_COMMUNITY): Payer: Self-pay

## 2012-10-08 NOTE — Telephone Encounter (Signed)
Patient notified to continue Hydrea 500mg  BID on Tuesdays, Fridays, Saturdays and Sundays and 500mg  daily all other days and to repeat CBC/diff in 1 month.  Patient verbalized understanding of instructions.

## 2012-10-28 ENCOUNTER — Encounter (HOSPITAL_COMMUNITY): Payer: BC Managed Care – PPO | Attending: Hematology and Oncology

## 2012-10-28 ENCOUNTER — Encounter (HOSPITAL_COMMUNITY): Payer: Self-pay

## 2012-10-28 VITALS — BP 88/54 | HR 90 | Resp 16 | Wt 154.9 lb

## 2012-10-28 DIAGNOSIS — C50919 Malignant neoplasm of unspecified site of unspecified female breast: Secondary | ICD-10-CM | POA: Insufficient documentation

## 2012-10-28 DIAGNOSIS — C50912 Malignant neoplasm of unspecified site of left female breast: Secondary | ICD-10-CM

## 2012-10-28 DIAGNOSIS — D473 Essential (hemorrhagic) thrombocythemia: Secondary | ICD-10-CM | POA: Insufficient documentation

## 2012-10-28 LAB — CBC WITH DIFFERENTIAL/PLATELET
Basophils Relative: 0 % (ref 0–1)
Eosinophils Absolute: 0.1 10*3/uL (ref 0.0–0.7)
Eosinophils Relative: 1 % (ref 0–5)
HCT: 37.2 % (ref 36.0–46.0)
Hemoglobin: 13 g/dL (ref 12.0–15.0)
MCH: 32.4 pg (ref 26.0–34.0)
MCHC: 34.9 g/dL (ref 30.0–36.0)
MCV: 92.8 fL (ref 78.0–100.0)
Monocytes Absolute: 0.8 10*3/uL (ref 0.1–1.0)
Monocytes Relative: 7 % (ref 3–12)
Neutro Abs: 8.1 10*3/uL — ABNORMAL HIGH (ref 1.7–7.7)

## 2012-10-28 NOTE — Patient Instructions (Addendum)
[facility] Discharge Instructions  RECOMMENDATIONS MADE BY THE CONSULTANT AND ANY TEST RESULTS WILL BE SENT TO YOUR REFERRING PHYSICIAN.  Lab work today. Continue Hydrea at current dose for now. Lab work every 4 weeks.  Goal for platelets is less than 400,000. MD appointment in 3-6 months. Remember to have your Mammogram done yearly. Report any issues/concerns to clinic as needed prior to appointment.  Thank you for choosing [facility] to provide your oncology and hematology care.  To afford each patient quality time with our providers, please arrive at least 15 minutes before your scheduled appointment time.  With your help, our goal is to use those 15 minutes to complete the necessary work-up to ensure our physicians have the information they need to help with your evaluation and healthcare recommendations.    Effective January 1st, 2014, we ask that you re-schedule your appointment with our physicians should you arrive 10 or more minutes late for your appointment.  We strive to give you quality time with our providers, and arriving late affects you and other patients whose appointments are after yours.    Again, thank you for choosing [facility].  Our hope is that these requests will decrease the amount of time that you wait before being seen by our physicians.       _____________________________________________________________  Should you have questions after your visit to [facility], please contact our office at [phone] between the hours of 8:30 a.m. and 5:00 p.m.  Voicemails left after 4:30 p.m. will not be returned until the following business day.  For prescription refill requests, have your pharmacy contact our office with your prescription refill request.

## 2012-10-28 NOTE — Progress Notes (Addendum)
[facility] [phone]   [phone]  OFFICE PROGRESS NOTE  [name], FNP [address] 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 She  had reduction surgery on the right and has been disease-free since her surgery. She is BRCA1 and BRCA2 negative.  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 She was initiated on Hydrea which she says that presently she is on 500mg  alternating with  500mg  bid . Recent bone marrow aspiration and biopsy on 07/18/2012 showed NORMOCELLULAR BONE MARROW WITH ATYPICAL MEGAKARYOCYTES.   INTERVAL HISTORY:   Ashley [name] 62 y.o. female returns to the clinic today for scheduled followup.  She denies any recent DVT ,PE, MI or CVA.  She denies any easy bruising or bleeding, no night sweats fever or chills.  She is a denies any significant weight loss.  She is eating well.  MEDICAL HISTORY: Past Medical History  Diagnosis Date  . Hypertension   . Hyperlipidemia   . Coronary artery disease   . Colon adenomas   . Cancer     breast, left, 1995; mastectomy/chemo    ALLERGIES:  is allergic to penicillins.  MEDICATIONS:  Current Outpatient Prescriptions  Medication Sig Dispense Refill  . aspirin 81 MG tablet Take 81 mg by mouth daily.        . calcium carbonate (OS-CAL) 600 MG TABS Take 600 mg by mouth 2 (two) times daily with a  meal.        . cholecalciferol (VITAMIN D) 1000 UNITS tablet Take 1,000 Units by mouth daily.      Marland Kitchen diltiazem (CARDIZEM CD) 240 MG 24 hr capsule Take 240 mg by mouth daily.        . furosemide (LASIX) 40 MG tablet Take 40 mg by mouth daily.        . hydroxyurea (HYDREA) 500 MG capsule Take 1 capsule twice daily on Thursdays, Fridays, Saturdays and Sundays and 1 capsule daily on Mondays, Tuesdays and Wednesdays. May take with food to minimize GI side effects.  60 capsule  2  . ibandronate (BONIVA) 150 MG tablet Take 1 tablet by mouth every 30 (thirty) days.      . irbesartan-hydrochlorothiazide (AVALIDE) 150-12.5 MG per tablet Take 1 tablet by mouth daily.        . isosorbide mononitrate (IMDUR) 60 MG 24 hr tablet Take 60 mg by mouth daily.        . potassium chloride SA (K-DUR,KLOR-CON) 20 MEQ tablet 20 mEq as needed.       . rosuvastatin (CRESTOR) 40 MG tablet Take 40 mg by mouth daily.         No current facility-administered medications for this visit.    SURGICAL HISTORY:  Past Surgical History  Procedure Laterality Date  . Mastectomy      left  . Appendectomy    . Ovarian cyst surgery  x2  . Breast surgery    . Cardiac catheterization    . Tubal ligation    . Colonoscopy  11/29/2010    Procedure: COLONOSCOPY;  Surgeon: Malissa Hippo, MD;  [facility];  Service: Endoscopy;  Laterality: N/A;  . Bone marrow aspiration  07/2012  . Bone marrow biopsy  07/2012     REVIEW OF SYSTEMS: 14 point review of system is as in the history above otherwise negative.    PHYSICAL EXAMINATION:  Blood pressure 88/54, pulse 90, resp. rate 16, weight 154 lb 14.4 oz (70.262 kg). GENERAL: No distress. SKIN:  No rashes or significant lesions  HEAD: Normocephalic, No masses, lesions, tenderness or abnormalities  EYES: Conjunctiva are pink and non-injected  ENT: External ears normal ,lips, buccal mucosa, and tongue normal and mucous membranes are moist  LYMPH: No palpable  lymphadenopathy, in the neck ,supraclavicular or axillary areas. BREAST:right breast is negative for any palpable masses.  Reconstructed the left breast is negative for evidence of local recurrence.  LUNGS: clear to auscultation , no crackles or wheezes HEART: regular rate & rhythm, no murmurs, no gallops, S1 normal and S2 normal  ABDOMEN: Abdomen soft, non-tender, normal bowel sounds, no masses or organomegaly and no hepatosplenomegaly palpable MSK: No CVA tenderness and no tenderness on percussion of the back or rib cage. EXTREMITIES: No edema, no skin discoloration or tenderness NEURO: alert & oriented , no focal motor/sensory deficits.     LABORATORY DATA: Lab Results  Component Value Date   WBC 11.3* 10/28/2012   HGB 13.0 10/28/2012   HCT 37.2 10/28/2012   MCV 92.8 10/28/2012   PLT 499* 10/28/2012      Chemistry   No results found for this basename: NA, K, CL, CO2, BUN, CREATININE, GLU   No results found for this basename: CALCIUM, ALKPHOS, AST, ALT, BILITOT       RADIOGRAPHIC STUDIES: No results found.   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 She has no evidence of disease as regards her breast cancer.  PLAN:  1. I will repeat a CBC today. 2. Continue Hydrea as previously prescribed,  Based on  CBC adjustments may be made to [facility]. 3. Return to clinic in 3 months for followup of ET. 4. I will order Jak 2 exon 12 and 13 mutations and also clareticulin testing makes for evidence of clonal marker which will make a stronger case for the diagnosis. 5. Continue daily aspirin.   All questions were satisfactorily answered. Patient knows to call if  any concern arises.  I spent more than 50 % counseling the patient face to face. The total time spent in the appointment was 30 minutes.  Addendum 1: CBC results today reviewed PLT is  approximately 500,000.I will increase Hydrea to 1 g daily.  Communication message sent to nursing.   Addendum 2: Jak 2 exon 12 & 13  Result was negative.  Caleticulin mutation based on the recent published [place] Journal article,April 03, 2012 vol. 369 no. 25. was also not detected. In the future patient can be checked for MPL.    Sherral Hammers, MD FACP. Hematology/Oncology.

## 2012-10-29 ENCOUNTER — Other Ambulatory Visit (HOSPITAL_COMMUNITY): Payer: Self-pay | Admitting: Hematology and Oncology

## 2012-10-29 DIAGNOSIS — D473 Essential (hemorrhagic) thrombocythemia: Secondary | ICD-10-CM

## 2012-10-29 MED ORDER — HYDROXYUREA 500 MG PO CAPS: 1000.0000 mg | ORAL_CAPSULE | Freq: Every day | ORAL | Status: DC

## 2012-11-03 ENCOUNTER — Encounter: Payer: Self-pay | Admitting: Oncology

## 2012-11-10 LAB — MISCELLANEOUS TEST

## 2012-11-10 LAB — JAK2 EXONS 12 & 13 MUTATION, REFLEX: JAK2 exon 13 mutation: NOT DETECTED

## 2012-11-19 ENCOUNTER — Encounter: Payer: Self-pay | Admitting: Hematology and Oncology

## 2012-11-24 ENCOUNTER — Telehealth (HOSPITAL_COMMUNITY): Payer: Self-pay | Admitting: *Deleted

## 2012-11-26 ENCOUNTER — Telehealth (HOSPITAL_COMMUNITY): Payer: Self-pay

## 2012-11-26 NOTE — Telephone Encounter (Signed)
Patient notified to increase Hydrea to 1500 mg on Saturdays and Sundays and to continue 1000 mg Mondays through Fridays.  To have CBC/Diff repeated in 1 month.  Verbalized understanding of instructions.

## 2012-12-16 ENCOUNTER — Telehealth (HOSPITAL_COMMUNITY): Payer: Self-pay

## 2012-12-16 ENCOUNTER — Other Ambulatory Visit (HOSPITAL_COMMUNITY): Payer: Self-pay | Admitting: Oncology

## 2012-12-16 NOTE — Telephone Encounter (Signed)
Per instruction of Thomas Kefalas, PA-C, patient notified to continue hydrea same dosage 1500mg Saturdays and Sundays and 1000 mg Mondays through Fridays and to have labs rechecked in 3 - 4 weeks.  Verbalized understanding of instructions. 

## 2012-12-16 NOTE — Telephone Encounter (Signed)
Call from patient.  Faxed recent blood work to office and wanted [place] to know that she had an episode of chest pain last week.  Saw cardiology, had holter monitor and is scheduled for stress test next week.  Is taking Hydrea 1500 mg on Saturdays and Sundays and 1000 mg Mondays through Fridays since 8/16.14.

## 2012-12-22 ENCOUNTER — Telehealth (HOSPITAL_COMMUNITY): Payer: Self-pay | Admitting: Oncology

## 2012-12-22 NOTE — Telephone Encounter (Signed)
Per instruction of Dellis Anes, [place], patient notified to continue hydrea same dosage 1500mg  Saturdays and Sundays and 1000 mg Mondays through Fridays and to have labs rechecked in 3 - 4 weeks.  Verbalized understanding of instructions.

## 2013-01-01 ENCOUNTER — Other Ambulatory Visit (HOSPITAL_COMMUNITY): Payer: Self-pay

## 2013-01-01 ENCOUNTER — Telehealth (HOSPITAL_COMMUNITY): Payer: Self-pay

## 2013-01-01 ENCOUNTER — Other Ambulatory Visit (HOSPITAL_COMMUNITY): Payer: Self-pay | Admitting: Oncology

## 2013-01-01 DIAGNOSIS — D473 Essential (hemorrhagic) thrombocythemia: Secondary | ICD-10-CM

## 2013-01-01 MED ORDER — HYDROXYUREA 500 MG PO CAPS: ORAL_CAPSULE | ORAL | Status: DC

## 2013-01-01 NOTE — Telephone Encounter (Signed)
Call from patient requesting refill for hydrea.  With dosage change is running out of medication.  Discussed with Dellis Anes, PA-C and 90 capsules with 3 refills e-scribed to [facility].

## 2013-01-19 ENCOUNTER — Other Ambulatory Visit (HOSPITAL_COMMUNITY): Payer: BC Managed Care – PPO

## 2013-01-28 ENCOUNTER — Ambulatory Visit (HOSPITAL_COMMUNITY): Payer: Managed Care, Other (non HMO) | Admitting: Oncology

## 2013-02-13 ENCOUNTER — Telehealth (HOSPITAL_COMMUNITY): Payer: Self-pay

## 2013-02-13 NOTE — Telephone Encounter (Signed)
Patient notified to continue same dosage of hydrea and to get CBC in 3 weeks.  Verbalized understanding of instructions.

## 2013-02-16 ENCOUNTER — Telehealth (HOSPITAL_COMMUNITY): Payer: Self-pay | Admitting: *Deleted

## 2013-02-16 NOTE — Telephone Encounter (Signed)
Spoke with pt about lab results from 02/13/13. Instruct pt to continue same dose Hydrea and have CBC again in 3 weeks. Pt verbalized understanding and said she will take care of lab work and have results faxed to [place].

## 2013-02-19 ENCOUNTER — Other Ambulatory Visit: Payer: Self-pay

## 2013-03-10 ENCOUNTER — Telehealth (HOSPITAL_COMMUNITY): Payer: Self-pay

## 2013-03-10 NOTE — Telephone Encounter (Signed)
Patient notified that blood counts were stable and to continue Hydrea same dosage and to get CBC diff in 4 weeks.  Verbalized understanding of instructions.

## 2013-03-30 ENCOUNTER — Other Ambulatory Visit (HOSPITAL_COMMUNITY): Payer: Self-pay | Admitting: Oncology

## 2013-03-30 DIAGNOSIS — Z139 Encounter for screening, unspecified: Secondary | ICD-10-CM

## 2013-03-31 ENCOUNTER — Encounter (HOSPITAL_COMMUNITY): Payer: BC Managed Care – PPO | Attending: Hematology and Oncology

## 2013-03-31 ENCOUNTER — Encounter (HOSPITAL_COMMUNITY): Payer: Self-pay

## 2013-03-31 ENCOUNTER — Ambulatory Visit (HOSPITAL_COMMUNITY)
Admission: RE | Admit: 2013-03-31 | Discharge: 2013-03-31 | Disposition: A | Discharge status: Home / Self Care | Payer: BC Managed Care – PPO | Source: Ambulatory Visit | Attending: Oncology | Admitting: Oncology

## 2013-03-31 VITALS — BP 109/68 | HR 88 | Temp 97.6°F | Resp 18 | Wt 153.8 lb

## 2013-03-31 DIAGNOSIS — Z1231 Encounter for screening mammogram for malignant neoplasm of breast: Secondary | ICD-10-CM | POA: Insufficient documentation

## 2013-03-31 DIAGNOSIS — Z853 Personal history of malignant neoplasm of breast: Secondary | ICD-10-CM

## 2013-03-31 DIAGNOSIS — D473 Essential (hemorrhagic) thrombocythemia: Secondary | ICD-10-CM

## 2013-03-31 DIAGNOSIS — Z139 Encounter for screening, unspecified: Secondary | ICD-10-CM

## 2013-03-31 NOTE — Patient Instructions (Signed)
[facility] Discharge Instructions  RECOMMENDATIONS MADE BY THE CONSULTANT AND ANY TEST RESULTS WILL BE SENT TO YOUR REFERRING PHYSICIAN.  Continue lab work every 4 weeks. Continue medications as prescribed. Report any issues/concerns to clinic as needed. Return to clinic in 6 months for follow up.  Thank you for choosing [facility] to provide your oncology and hematology care.  To afford each patient quality time with our providers, please arrive at least 15 minutes before your scheduled appointment time.  With your help, our goal is to use those 15 minutes to complete the necessary work-up to ensure our physicians have the information they need to help with your evaluation and healthcare recommendations.    Effective January 1st, 2014, we ask that you re-schedule your appointment with our physicians should you arrive 10 or more minutes late for your appointment.  We strive to give you quality time with our providers, and arriving late affects you and other patients whose appointments are after yours.    Again, thank you for choosing [facility].  Our hope is that these requests will decrease the amount of time that you wait before being seen by our physicians.       _____________________________________________________________  Should you have questions after your visit to [facility], please contact our office at [phone] between the hours of 8:30 a.m. and 5:00 p.m.  Voicemails left after 4:30 p.m. will not be returned until the following business day.  For prescription refill requests, have your pharmacy contact our office with your prescription refill request.

## 2013-03-31 NOTE — Progress Notes (Signed)
[facility]  OFFICE PROGRESS NOTE  [name], FNP [address] [place] [address]  DIAGNOSIS: No diagnosis found.  Chief Complaint  Patient presents with  . Breast Cancer  . Thrombocytosis    CURRENT THERAPY: Hydrea 500 mg alternating with 1000 mg daily.  INTERVAL HISTORY: Ashley [name] 62 y.o. 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 She feels well except for lower extremity swelling at the end of the day which dissipates overnight. She does suffer with easy satiety but without nausea, vomiting, fever, night sweats, abnormalities on self breast examination, lymphedema, cough, wheezing, sore throat, skin rash, headache, dysuria, hematuria, incontinence, frequency, nocturia, skin rash, headache, or seizures.  MEDICAL HISTORY: Past Medical History  Diagnosis Date  . Hypertension   . Hyperlipidemia   . Coronary artery disease   . Colon adenomas   . Cancer     breast, left, 1995; mastectomy/chemo    INTERIM HISTORY:  does not have a problem list on file.   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 She  had reduction surgery on the right and has been disease-free since her surgery. 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 She was initiated on Hydrea which she says that presently she is on 500mg  alternating with  500mg  bid .  Recent bone marrow aspiration and biopsy on 07/18/2012 showed NORMOCELLULAR BONE MARROW WITH ATYPICAL MEGAKARYOCYTES.     ALLERGIES:  is allergic to penicillins.  MEDICATIONS: has a current medication list which includes the following prescription(s): aspirin, calcium carbonate, cholecalciferol, diltiazem, furosemide, hydroxyurea, ibandronate, irbesartan-hydrochlorothiazide, isosorbide mononitrate, rosuvastatin, and potassium chloride sa.  SURGICAL HISTORY:  Past Surgical History  Procedure Laterality Date  . Mastectomy      left  . Appendectomy    . Ovarian cyst surgery      x2  . Breast surgery    . Cardiac catheterization    . Tubal ligation    . Colonoscopy  11/29/2010    Procedure: COLONOSCOPY;  Surgeon: Malissa Hippo, MD;  Location: [facility];  Service: Endoscopy;  Laterality: N/A;  . Bone marrow aspiration  07/2012  . Bone marrow biopsy  07/2012    FAMILY HISTORY: family history includes Cancer in her other; Celiac disease in her other; Coronary artery disease in her father; Heart failure in her mother; Hypertension in her father, mother, and sister.  SOCIAL HISTORY:  reports that she has been smoking Cigarettes.  She has a 10 pack-year smoking history. She has never used smokeless tobacco. She reports that she drinks alcohol. She reports that she does not use illicit drugs.  REVIEW OF SYSTEMS:  Other than that discussed above is noncontributory.  PHYSICAL EXAMINATION: ECOG PERFORMANCE STATUS: 0 - Asymptomatic  Blood pressure 109/68, pulse 88, temperature 97.6 F (36.4 C), temperature source Oral, resp. rate 18, weight 153 lb 12.8 oz (69.763 kg).  GENERAL:alert, no distress and comfortable SKIN:  skin color, texture, turgor are normal, no rashes or significant lesions EYES: PERLA; Conjunctiva are pink and non-injected, sclera clear OROPHARYNX:no exudate, no erythema on lips, buccal mucosa, or tongue. NECK: supple, thyroid normal size, non-tender,  without nodularity. No masses CHEST: Status post left breast reconstruction. No mass in the right breast. LYMPH:  no palpable lymphadenopathy in the cervical, axillary or inguinal LUNGS: clear to auscultation and percussion with normal breathing effort HEART: regular rate & rhythm and no murmurs. ABDOMEN:abdomen soft, non-tender and normal bowel sounds MUSCULOSKELETAL:no cyanosis of digits and no clubbing. Range of motion normal. No lymphedema. NEURO: alert & oriented x 3 with fluent speech, no focal motor/sensory deficits   LABORATORY DATA: No visits with results within 30 Day(s) from this visit. Latest known visit with results is:  Office Visit on 10/28/2012  Component Date Value Range Status  . WBC 10/28/2012 11.3* 4.0 - 10.5 K/uL Final  . RBC 10/28/2012 4.01  3.87 - 5.11 MIL/uL Final  . Hemoglobin 10/28/2012 13.0  12.0 - 15.0 g/dL Final  . HCT 16/01/9603 37.2  36.0 - 46.0 % Final  . MCV 10/28/2012 92.8  78.0 - 100.0 fL Final  . MCH 10/28/2012 32.4  26.0 - 34.0 pg Final  . MCHC 10/28/2012 34.9  30.0 - 36.0 g/dL Final  . RDW 54/12/8117 19.3* 11.5 - 15.5 % Final  . Platelets 10/28/2012 499* 150 - 400 K/uL Final  . Neutrophils Relative % 10/28/2012 72  43 - 77 % Final  . Neutro Abs 10/28/2012 8.1* 1.7 - 7.7 K/uL Final  . Lymphocytes Relative 10/28/2012 20  12 - 46 % Final  . Lymphs Abs 10/28/2012 2.3  0.7 - 4.0 K/uL Final  . Monocytes Relative 10/28/2012 7  3 - 12 % Final  . Monocytes Absolute 10/28/2012 0.8  0.1 - 1.0 K/uL Final  . Eosinophils Relative 10/28/2012 1  0 - 5 % Final  . Eosinophils Absolute 10/28/2012 0.1  0.0 - 0.7 K/uL Final  . Basophils Relative 10/28/2012 0  0 - 1 % Final  . Basophils Absolute 10/28/2012 0.0  0.0 - 0.1 K/uL Final  . Specimen source - RFXJAK 10/28/2012 Blood   Corrected   CORRECTED ON 07/19 AT 1713: PREVIOUSLY REPORTED AS WHOLE BLOOD  . JAK2 exon 12 mutation 10/28/2012 NOT DETECTED   Final  . JAK2 exon 13 mutation 10/28/2012 NOT DETECTED   Final    Comment: (NOTE)                          If there is clinical suspicion for a myeloproliferative                          neoplasm (MPN), an advanced sequencing panel can be                          ordered that asesses 7 other commonly-mutated genes in                          MPN (test code 14782).  . Comment - RFXJAK 10/28/2012 REPORT   Final   Comment: (NOTE)                          [place], M.D., Ph.D  Medical Director, Molecular Oncology                          Method and Interpretation:                          Total nucleic acid isolated from plasma was used to                          sequence exons 12 and 13 of JAK2 for mutations                          associated with myeloproliferative neoplasms. Our                          studies demonstrate that plasma yields superior                          sensitivity to cell-based methods and may detect                          mutant alleles down to 5% in a mixed population. If                          insufficient plasma or its bone marrow aspirate                          equivalent is obtained from the sample, testing may                          be performed on nucleic acid extracted from cells.                          Several different JAK2 mutations have been described                          in exon 12(and less commonly in exon 13) in cases of                          polycythemia vera that lack the V617F JAK2 mutation.                          JAK2 mutations are not detected in normal individuals.                          This test was developed and its performance                          characteristics have been determined by [employer], [place], [place].                          Performance characteristics refer to the  analytical performance of the test.  . Miscellaneous Test 10/28/2012 87636   Final   CALRETICULIN  MUTATION ANALYSIS  . Miscellaneous Test Results 10/28/2012 SEE SEPARATE REPORT   Final    PATHOLOGY: No new pathology.  Urinalysis No results found for this basename: colorurine,  appearanceur,  labspec,  phurine,  glucoseu,  hgbur,  bilirubinur,  ketonesur,  proteinur,  urobilinogen,  nitrite,  leukocytesur    RADIOGRAPHIC STUDIES: Scheduled for mammogram later this morning.  ASSESSMENT:  #1. 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 She does undergo CBC testing there on an every 4 week basis. #2. Stage II left breast cancer, no evidence of disease for mammography later this morning. #3. Hypertension, on treatment. #4. Coronary artery disease, no evidence of dysrhythmia or heart failure. #5. History of colon adenomas, followed by gastroenterology.   PLAN:  #1. 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  #2. Mammogram later today. #3. Continue Hydrea 500 mg alternating with 1000 mg daily with every 4 week CBC followup. #4. Followup in 6 months.   All questions were answered. The patient knows to call the clinic with any problems, questions or concerns. We can certainly see the patient much sooner if necessary.   I spent 25 minutes counseling the patient face to face. The total time spent in the appointment was 30 minutes.    Maurilio Lovely, MD 03/31/2013 11:13 AM

## 2013-04-15 ENCOUNTER — Telehealth (HOSPITAL_COMMUNITY): Payer: Self-pay

## 2013-04-15 NOTE — Telephone Encounter (Signed)
Call back confirmation regarding Hydrea changes received.

## 2013-04-15 NOTE — Telephone Encounter (Signed)
Message left for patient to contact office regarding recent lab work.  To decrease Hydrea to 500 mg Mondays through Fridays and 1000 mg on Saturdays and Sundays.  Call back confirmation requested.

## 2013-05-12 ENCOUNTER — Telehealth (HOSPITAL_COMMUNITY): Payer: Self-pay

## 2013-05-12 NOTE — Telephone Encounter (Signed)
Notified to continue hydrea 500 mg Monday through Friday and 1000 mg on Saturdays and Sundays.  Verbalized understanding of instructions.

## 2013-06-23 ENCOUNTER — Telehealth (HOSPITAL_COMMUNITY): Payer: Self-pay

## 2013-06-23 NOTE — Telephone Encounter (Signed)
Based upon lab work of 06/18/13 per Robynn Pane, PA-C, patient notified to continue hydrea same dosage and to have lab work repeated in 4 - 6 weeks.  Verbalized understanding of instructions.

## 2013-07-18 ENCOUNTER — Other Ambulatory Visit (HOSPITAL_COMMUNITY): Payer: Self-pay | Admitting: Oncology

## 2013-07-20 ENCOUNTER — Other Ambulatory Visit (HOSPITAL_COMMUNITY): Payer: Self-pay | Admitting: Oncology

## 2013-07-28 ENCOUNTER — Telehealth (HOSPITAL_COMMUNITY): Payer: Self-pay

## 2013-07-28 NOTE — Telephone Encounter (Signed)
Call back confirmation from patient.  She received and understands the instructions.

## 2013-07-28 NOTE — Telephone Encounter (Signed)
Message left for patient to continue same dosage of hydrea and to have labs repeated in 4 weeks.  Call back confirmation requested.

## 2013-08-26 ENCOUNTER — Telehealth (HOSPITAL_COMMUNITY): Payer: Self-pay

## 2013-08-26 NOTE — Telephone Encounter (Signed)
Per Robynn Pane, PA-C, patient notified to continue same dosage of Hydrea and to continue ASA and to have CBC/Diff in 3 - 4 weeks.  Verbalized understanding of instructions.

## 2013-09-02 ENCOUNTER — Encounter: Payer: Self-pay | Admitting: Hematology and Oncology

## 2013-09-22 ENCOUNTER — Ambulatory Visit (HOSPITAL_COMMUNITY): Payer: BC Managed Care – PPO

## 2013-09-29 ENCOUNTER — Ambulatory Visit (HOSPITAL_COMMUNITY): Payer: BC Managed Care – PPO

## 2013-11-18 ENCOUNTER — Encounter (INDEPENDENT_AMBULATORY_CARE_PROVIDER_SITE_OTHER): Payer: Self-pay | Admitting: *Deleted

## 2013-11-25 ENCOUNTER — Other Ambulatory Visit (INDEPENDENT_AMBULATORY_CARE_PROVIDER_SITE_OTHER): Payer: Self-pay | Admitting: *Deleted

## 2013-11-25 DIAGNOSIS — Z800 Family history of malignant neoplasm of digestive organs: Secondary | ICD-10-CM

## 2013-11-25 DIAGNOSIS — Z86010 Personal history of colonic polyps: Secondary | ICD-10-CM

## 2014-01-15 ENCOUNTER — Telehealth (INDEPENDENT_AMBULATORY_CARE_PROVIDER_SITE_OTHER): Payer: Self-pay | Admitting: *Deleted

## 2014-01-15 DIAGNOSIS — Z1211 Encounter for screening for malignant neoplasm of colon: Secondary | ICD-10-CM

## 2014-01-15 NOTE — Telephone Encounter (Signed)
Patients needs movi prep

## 2014-01-18 MED ORDER — PEG-KCL-NACL-NASULF-NA ASC-C 100 G PO SOLR: 1.0000 | Freq: Once | ORAL | Status: DC

## 2014-01-21 ENCOUNTER — Telehealth (INDEPENDENT_AMBULATORY_CARE_PROVIDER_SITE_OTHER): Payer: Self-pay | Admitting: *Deleted

## 2014-01-21 NOTE — Telephone Encounter (Signed)
PCP: jill vanhorn   Procedure: tcs  Reason/Indication:  Hx polyps, fam hx colon ca  Has patient had this procedure before?  Yes, 2012 -- epic  If so, when, by whom and where?    Is there a family history of colon cancer?  Yes, daughter  Who?  What age when diagnosed?    Is patient diabetic?   no      Does patient have prosthetic heart valve?  no  Do you have a pacemaker?  no  Has patient ever had endocarditis? no  Has patient had joint replacement within last 12 months?  no  Does patient tend to be constipated or take laxatives? no  Is patient on Coumadin, Plavix and/or Aspirin? yes  Medications:  See epic  Allergies: see epic  Medication Adjustment: asa 2 days  Procedure date & time: 02/18/14 at 59

## 2014-01-21 NOTE — Telephone Encounter (Signed)
agree

## 2014-02-10 ENCOUNTER — Encounter (HOSPITAL_COMMUNITY): Payer: Self-pay | Admitting: Pharmacy Technician

## 2014-02-18 ENCOUNTER — Encounter (HOSPITAL_COMMUNITY): Payer: Self-pay | Admitting: *Deleted

## 2014-02-18 ENCOUNTER — Encounter (HOSPITAL_COMMUNITY)
Admission: RE | Disposition: A | Discharge status: Home / Self Care | Payer: Self-pay | Source: Ambulatory Visit | Attending: Internal Medicine

## 2014-02-18 ENCOUNTER — Ambulatory Visit (HOSPITAL_COMMUNITY)
Admission: RE | Admit: 2014-02-18 | Discharge: 2014-02-18 | Disposition: A | Discharge status: Home / Self Care | Payer: Medicare Other | Source: Ambulatory Visit | Attending: Internal Medicine | Admitting: Internal Medicine

## 2014-02-18 DIAGNOSIS — I10 Essential (primary) hypertension: Secondary | ICD-10-CM | POA: Insufficient documentation

## 2014-02-18 DIAGNOSIS — D122 Benign neoplasm of ascending colon: Secondary | ICD-10-CM | POA: Diagnosis not present

## 2014-02-18 DIAGNOSIS — D123 Benign neoplasm of transverse colon: Secondary | ICD-10-CM

## 2014-02-18 DIAGNOSIS — F17210 Nicotine dependence, cigarettes, uncomplicated: Secondary | ICD-10-CM | POA: Diagnosis not present

## 2014-02-18 DIAGNOSIS — Z803 Family history of malignant neoplasm of breast: Secondary | ICD-10-CM | POA: Diagnosis not present

## 2014-02-18 DIAGNOSIS — E785 Hyperlipidemia, unspecified: Secondary | ICD-10-CM | POA: Diagnosis not present

## 2014-02-18 DIAGNOSIS — K6389 Other specified diseases of intestine: Secondary | ICD-10-CM | POA: Insufficient documentation

## 2014-02-18 DIAGNOSIS — E780 Pure hypercholesterolemia: Secondary | ICD-10-CM | POA: Diagnosis not present

## 2014-02-18 DIAGNOSIS — Z79899 Other long term (current) drug therapy: Secondary | ICD-10-CM | POA: Insufficient documentation

## 2014-02-18 DIAGNOSIS — Z1211 Encounter for screening for malignant neoplasm of colon: Secondary | ICD-10-CM | POA: Diagnosis not present

## 2014-02-18 DIAGNOSIS — Z7983 Long term (current) use of bisphosphonates: Secondary | ICD-10-CM | POA: Diagnosis not present

## 2014-02-18 DIAGNOSIS — D125 Benign neoplasm of sigmoid colon: Secondary | ICD-10-CM | POA: Insufficient documentation

## 2014-02-18 DIAGNOSIS — Z7982 Long term (current) use of aspirin: Secondary | ICD-10-CM | POA: Insufficient documentation

## 2014-02-18 DIAGNOSIS — Z800 Family history of malignant neoplasm of digestive organs: Secondary | ICD-10-CM

## 2014-02-18 DIAGNOSIS — I2510 Atherosclerotic heart disease of native coronary artery without angina pectoris: Secondary | ICD-10-CM | POA: Diagnosis not present

## 2014-02-18 DIAGNOSIS — Z86010 Personal history of colonic polyps: Secondary | ICD-10-CM

## 2014-02-18 HISTORY — PX: COLONOSCOPY: SHX5424

## 2014-02-18 SURGERY — COLONOSCOPY
Anesthesia: Moderate Sedation

## 2014-02-18 MED ORDER — SODIUM CHLORIDE 0.9 % IV SOLN
INTRAVENOUS | Status: DC
  Administered 2014-02-18: 1000 mL via INTRAVENOUS

## 2014-02-18 MED ORDER — STERILE WATER FOR IRRIGATION IR SOLN
Status: DC | PRN
  Administered 2014-02-18: 11:00:00

## 2014-02-18 MED ORDER — MIDAZOLAM HCL 5 MG/5ML IJ SOLN
INTRAMUSCULAR | Status: AC
  Filled 2014-02-18: qty 10

## 2014-02-18 MED ORDER — MIDAZOLAM HCL 5 MG/5ML IJ SOLN
INTRAMUSCULAR | Status: DC | PRN
  Administered 2014-02-18 (×3): 2 mg via INTRAVENOUS

## 2014-02-18 MED ORDER — MEPERIDINE HCL 50 MG/ML IJ SOLN
INTRAMUSCULAR | Status: DC
Start: 2014-02-18 — End: 2014-02-18
  Filled 2014-02-18: qty 1

## 2014-02-18 MED ORDER — MEPERIDINE HCL 50 MG/ML IJ SOLN
INTRAMUSCULAR | Status: DC | PRN
  Administered 2014-02-18 (×2): 25 mg via INTRAVENOUS

## 2014-02-18 NOTE — Op Note (Signed)
COLONOSCOPY PROCEDURE REPORT  PATIENT:  Ashley [name]  MR#:  [id number] Birthdate:  [birth year], 63 y.o., female Endoscopist:  Dr. Rogene Houston, MD Referred By:  Dr. Nino Parsley, DO Procedure Date: 02/18/2014  Procedure:   Colonoscopy  Indications:  Patient is 63 year old Caucasian female with history of colonic adenomas who is here for surveillance colonoscopy. She had 2 colonoscopies in 2012 with removal of multiple tubular adenomas as well as tubulovillous adenomas. Her daughter was diagnosed with rectal adenocarcinoma at age 32. She is in remission.  Informed Consent:  The procedure and risks were reviewed with the patient and informed consent was obtained.  Medications:  Demerol 50 mg IV Versed 6 mg IV  Description of procedure:  After a digital rectal exam was performed, that colonoscope was advanced from the anus through the rectum and colon to the area of the cecum, ileocecal valve and appendiceal orifice. The cecum was deeply intubated. These structures were well-seen and photographed for the record. From the level of the cecum and ileocecal valve, the scope was slowly and cautiously withdrawn. The mucosal surfaces were carefully surveyed utilizing scope tip to flexion to facilitate fold flattening as needed. The scope was pulled down into the rectum where a thorough exam including retroflexion was performed.  Findings:   Prep excellent. 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 6 small polyps ablated via cold biopsy and submitted together. One was located at ascending colon, 4 at transverse colon and 1 at sigmoid colon. Unremarkable anorectal junction.   Therapeutic/Diagnostic Maneuvers Performed:  See above  Complications:  none  Cecal Withdrawal Time:  11 minutes  Impression:  Examination performed to cecum. 6 small polyps ablated via cold biopsy and submitted together( 1 at ascending colon, 4 at transverse colon and 1 at sigmoid colon). Melanosis  coli.  Recommendations:  Standard instructions given. I will contact patient with biopsy results and further recommendations.  REHMAN,NAJEEB U  02/18/2014 11:09 AM  CC: Dr. Velta Addison, Claretha Cooper, DO & Dr. Rayne Du ref. provider found    11

## 2014-02-18 NOTE — H&P (Signed)
Ashley [name] is an 63 y.o. female.   Chief Complaint:  Patient is here for colonoscopy. HPI:  Patient is 63 year old Caucasian female was and multiple colonic adenomas removed on prior colonoscopies. She underwent 2 colonoscopies in 2012 all of the polyps were removed. She denies abdominal pain change in bowel habits or rectal bleeding. She has good appetite and her weight is stable. Family history significant for rectal carcinoma and a daughter who was 62 at the time of diagnosis and she is in remission.  Past Medical History  Diagnosis Date  . Hypertension   . Hyperlipidemia   . Coronary artery disease   . Colon adenomas   . Cancer 1995    breast, left, mastectomy/chemo  . Glucose intolerance (impaired glucose tolerance)   . Thrombocytosis     Idiopathic  . Chest pain     Possibly cardiac. No evidence of ischemia/injury based upon normal troponin I. Chest discomfort could be tachycardia induced supply demand mismatch.   . Palpitations   . Pure hypercholesterolemia   . Essential hypertension, benign     Past Surgical History  Procedure Laterality Date  . Mastectomy      left  . Appendectomy    . Ovarian cyst surgery      x2  . Breast surgery    . Cardiac catheterization    . Tubal ligation    . Colonoscopy  11/29/2010    Procedure: COLONOSCOPY;  Surgeon: Rogene Houston, MD;  Location: [facility];  Service: Endoscopy;  Laterality: N/A;  . Bone marrow aspiration  07/2012  . Bone marrow biopsy  07/2012    Family History  Problem Relation Age of Onset  . Hypertension Mother   . Heart failure Mother   . Congestive Heart Failure Mother   . COPD Mother   . Pernicious anemia Mother   . Cancer Mother     lung  . Hypertension Father   . CAD Father   . Heart attack Father   . Hypertension Sister   . Cancer Other   . Celiac disease Other    Social History:  reports that she has been smoking Cigarettes.  She has a 20 pack-year smoking history. She has never used smokeless  tobacco. She reports that she does not drink alcohol or use illicit drugs.  Allergies:  Allergies  Allergen Reactions  . Penicillins Hives and Rash    Medications Prior to Admission  Medication Sig Dispense Refill  . aspirin 81 MG tablet Take 81 mg by mouth daily.      . Calcium Carb-Cholecalciferol (CALCIUM 600 + D) 600-200 MG-UNIT TABS Take 1 tablet by mouth 2 (two) times daily with a meal.    . cholecalciferol (VITAMIN D) 1000 UNITS tablet Take 1,000 Units by mouth daily.    Marland Kitchen diltiazem (CARDIZEM CD) 240 MG 24 hr capsule Take 240 mg by mouth daily.      . furosemide (LASIX) 40 MG tablet Take 40 mg by mouth daily.      . hydroxyurea (HYDREA) 500 MG capsule 500 mg on M-F and 1000 mg on S-S 50 capsule 3  . ibandronate (BONIVA) 150 MG tablet Take 1 tablet by mouth every 30 (thirty) days.    . irbesartan-hydrochlorothiazide (AVALIDE) 150-12.5 MG per tablet Take 1 tablet by mouth daily.      . isosorbide mononitrate (IMDUR) 60 MG 24 hr tablet Take 60 mg by mouth daily.      . peg 3350 powder (MOVIPREP) 100 G  SOLR Take 1 kit (200 g total) by mouth once. 1 kit 0  . rosuvastatin (CRESTOR) 40 MG tablet Take 40 mg by mouth daily.        No results found for this or any previous visit (from the past 48 hour(s)). No results found.  ROS  Blood pressure 99/66, temperature 97.6 F (36.4 C), temperature source Oral, resp. rate 20, height '5\' 2"'  (1.575 m), weight 140 lb (63.504 kg), SpO2 95 %. Physical Exam  Constitutional: She appears well-developed and well-nourished.  HENT:  Mouth/Throat: Oropharynx is clear and moist.  Eyes: Conjunctivae are normal. No scleral icterus.  Neck: No thyromegaly present.  Cardiovascular: Normal rate, regular rhythm and normal heart sounds.   No murmur heard. Respiratory: Effort normal and breath sounds normal.  GI: Soft. She exhibits no distension and no mass. There is no tenderness.  Musculoskeletal: She exhibits no edema.  Lymphadenopathy:    She has no  cervical adenopathy.  Neurological: She is alert.  Skin: Skin is warm and dry.     Assessment/Plan History of multiple colonic adenomas. Patient has daughter with history of colon carcinoma. Surveillance colonoscopy.  Shiela Bruns U 02/18/2014, 10:23 AM

## 2014-02-18 NOTE — Discharge Instructions (Signed)
Resume usual medications and diet. No driving for 24 hours. Physician will call with biopsy results.  Colonoscopy, Care After  Refer to this sheet in the next few weeks. These instructions provide you with information on caring for yourself after your procedure. Your health care provider may also give you more specific instructions. Your treatment has been planned according to current medical practices, but problems sometimes occur. Call your health care provider if you have any problems or questions after your procedure. WHAT TO EXPECT AFTER THE PROCEDURE  After your procedure, it is typical to have the following:  A small amount of blood in your stool.  Moderate amounts of gas and mild abdominal cramping or bloating. HOME CARE INSTRUCTIONS  Do not drive, operate machinery, or sign important documents for 24 hours.  You may shower and resume your regular physical activities, but move at a slower pace for the first 24 hours.  Take frequent rest periods for the first 24 hours.  Walk around or put a warm pack on your abdomen to help reduce abdominal cramping and bloating.  Drink enough fluids to keep your urine clear or pale yellow.  You may resume your normal diet as instructed by your health care provider. Avoid heavy or fried foods that are hard to digest.  Avoid drinking alcohol for 24 hours or as instructed by your health care provider.  Only take over-the-counter or prescription medicines as directed by your health care provider.  If a tissue sample (biopsy) was taken during your procedure:  Do not take aspirin or blood thinners for 7 days, or as instructed by your health care provider.  Do not drink alcohol for 7 days, or as instructed by your health care provider.  Eat soft foods for the first 24 hours. SEEK MEDICAL CARE IF: You have persistent spotting of blood in your stool 2-3 days after the procedure. SEEK IMMEDIATE MEDICAL CARE IF:  You have more than a small  spotting of blood in your stool.  You pass large blood clots in your stool.  Your abdomen is swollen (distended).  You have nausea or vomiting.  You have a fever.  You have increasing abdominal pain that is not relieved with medicine.   Colon Polyps  Polyps are lumps of extra tissue growing inside the body. Polyps can grow in the large intestine (colon). Most colon polyps are noncancerous (benign). However, some colon polyps can become cancerous over time. Polyps that are larger than a pea may be harmful. To be safe, caregivers remove and test all polyps. CAUSES  Polyps form when mutations in the genes cause your cells to grow and divide even though no more tissue is needed. RISK FACTORS There are a number of risk factors that can increase your chances of getting colon polyps. They include:  Being older than 50 years.  Family history of colon polyps or colon cancer.  Long-term colon diseases, such as colitis or Crohn disease.  Being overweight.  Smoking.  Being inactive.  Drinking too much alcohol. SYMPTOMS  Most small polyps do not cause symptoms. If symptoms are present, they may include:  Blood in the stool. The stool may look dark red or black.  Constipation or diarrhea that lasts longer than 1 week. DIAGNOSIS People often do not know they have polyps until their caregiver finds them during a regular checkup. Your caregiver can use 4 tests to check for polyps:  Digital rectal exam. The caregiver wears gloves and feels inside the rectum. This test  would find polyps only in the rectum.  Barium enema. The caregiver puts a liquid called barium into your rectum before taking X-rays of your colon. Barium makes your colon look white. Polyps are dark, so they are easy to see in the X-ray pictures.  Sigmoidoscopy. A thin, flexible tube (sigmoidoscope) is placed into your rectum. The sigmoidoscope has a light and tiny camera in it. The caregiver uses the sigmoidoscope to look  at the last third of your colon.  Colonoscopy. This test is like sigmoidoscopy, but the caregiver looks at the entire colon. This is the most common method for finding and removing polyps. TREATMENT  Any polyps will be removed during a sigmoidoscopy or colonoscopy. The polyps are then tested for cancer. PREVENTION  To help lower your risk of getting more colon polyps:  Eat plenty of fruits and vegetables. Avoid eating fatty foods.  Do not smoke.  Avoid drinking alcohol.  Exercise every day.  Lose weight if recommended by your caregiver.  Eat plenty of calcium and folate. Foods that are rich in calcium include milk, cheese, and broccoli. Foods that are rich in folate include chickpeas, kidney beans, and spinach. HOME CARE INSTRUCTIONS Keep all follow-up appointments as directed by your caregiver. You may need periodic exams to check for polyps. SEEK MEDICAL CARE IF: You notice bleeding during a bowel movement.

## 2014-02-22 ENCOUNTER — Encounter (HOSPITAL_COMMUNITY): Payer: Self-pay | Admitting: Internal Medicine

## 2014-03-04 ENCOUNTER — Encounter (INDEPENDENT_AMBULATORY_CARE_PROVIDER_SITE_OTHER): Payer: Self-pay | Admitting: *Deleted

## 2014-03-30 ENCOUNTER — Other Ambulatory Visit (HOSPITAL_COMMUNITY): Payer: Self-pay | Admitting: Oncology

## 2014-03-30 DIAGNOSIS — Z1231 Encounter for screening mammogram for malignant neoplasm of breast: Secondary | ICD-10-CM

## 2014-04-14 ENCOUNTER — Ambulatory Visit (HOSPITAL_COMMUNITY): Payer: Medicare Other

## 2014-04-14 ENCOUNTER — Ambulatory Visit (HOSPITAL_COMMUNITY)
Admission: RE | Admit: 2014-04-14 | Discharge: 2014-04-14 | Disposition: A | Discharge status: Home / Self Care | Payer: Medicare Other | Source: Ambulatory Visit | Attending: Oncology | Admitting: Oncology

## 2014-04-14 DIAGNOSIS — Z1231 Encounter for screening mammogram for malignant neoplasm of breast: Secondary | ICD-10-CM | POA: Insufficient documentation

## 2014-07-21 DIAGNOSIS — Z1371 Encounter for nonprocreative screening for genetic disease carrier status: Secondary | ICD-10-CM | POA: Insufficient documentation

## 2014-10-01 ENCOUNTER — Other Ambulatory Visit: Payer: Self-pay | Admitting: Urology

## 2014-11-05 ENCOUNTER — Other Ambulatory Visit: Payer: Self-pay | Admitting: Obstetrics and Gynecology

## 2014-12-07 ENCOUNTER — Other Ambulatory Visit: Payer: Self-pay

## 2014-12-07 ENCOUNTER — Encounter (HOSPITAL_COMMUNITY): Payer: Self-pay

## 2014-12-07 ENCOUNTER — Encounter (HOSPITAL_COMMUNITY)
Admission: RE | Admit: 2014-12-07 | Discharge: 2014-12-07 | Disposition: A | Discharge status: Home / Self Care | Payer: Medicare Other | Source: Ambulatory Visit | Attending: Obstetrics and Gynecology | Admitting: Obstetrics and Gynecology

## 2014-12-07 DIAGNOSIS — R079 Chest pain, unspecified: Secondary | ICD-10-CM | POA: Diagnosis not present

## 2014-12-07 DIAGNOSIS — I10 Essential (primary) hypertension: Secondary | ICD-10-CM | POA: Diagnosis not present

## 2014-12-07 DIAGNOSIS — N816 Rectocele: Secondary | ICD-10-CM | POA: Diagnosis not present

## 2014-12-07 DIAGNOSIS — N3946 Mixed incontinence: Secondary | ICD-10-CM | POA: Diagnosis not present

## 2014-12-07 DIAGNOSIS — E780 Pure hypercholesterolemia: Secondary | ICD-10-CM | POA: Diagnosis not present

## 2014-12-07 DIAGNOSIS — E876 Hypokalemia: Secondary | ICD-10-CM | POA: Diagnosis not present

## 2014-12-07 DIAGNOSIS — F17210 Nicotine dependence, cigarettes, uncomplicated: Secondary | ICD-10-CM | POA: Diagnosis not present

## 2014-12-07 DIAGNOSIS — N9489 Other specified conditions associated with female genital organs and menstrual cycle: Secondary | ICD-10-CM | POA: Diagnosis not present

## 2014-12-07 DIAGNOSIS — Z853 Personal history of malignant neoplasm of breast: Secondary | ICD-10-CM | POA: Diagnosis not present

## 2014-12-07 DIAGNOSIS — N814 Uterovaginal prolapse, unspecified: Secondary | ICD-10-CM | POA: Diagnosis present

## 2014-12-07 DIAGNOSIS — Z7982 Long term (current) use of aspirin: Secondary | ICD-10-CM | POA: Diagnosis not present

## 2014-12-07 DIAGNOSIS — D473 Essential (hemorrhagic) thrombocythemia: Secondary | ICD-10-CM | POA: Diagnosis not present

## 2014-12-07 DIAGNOSIS — K219 Gastro-esophageal reflux disease without esophagitis: Secondary | ICD-10-CM | POA: Diagnosis not present

## 2014-12-07 DIAGNOSIS — Z9221 Personal history of antineoplastic chemotherapy: Secondary | ICD-10-CM | POA: Diagnosis not present

## 2014-12-07 DIAGNOSIS — I2510 Atherosclerotic heart disease of native coronary artery without angina pectoris: Secondary | ICD-10-CM | POA: Diagnosis not present

## 2014-12-07 DIAGNOSIS — E785 Hyperlipidemia, unspecified: Secondary | ICD-10-CM | POA: Diagnosis not present

## 2014-12-07 DIAGNOSIS — Z86010 Personal history of colonic polyps: Secondary | ICD-10-CM | POA: Diagnosis not present

## 2014-12-07 DIAGNOSIS — Z880 Allergy status to penicillin: Secondary | ICD-10-CM | POA: Diagnosis not present

## 2014-12-07 DIAGNOSIS — Z9012 Acquired absence of left breast and nipple: Secondary | ICD-10-CM | POA: Diagnosis not present

## 2014-12-07 HISTORY — DX: Gastro-esophageal reflux disease without esophagitis: K21.9

## 2014-12-07 LAB — BASIC METABOLIC PANEL
Anion gap: 10 (ref 5–15)
BUN: 13 mg/dL (ref 6–20)
CALCIUM: 9.1 mg/dL (ref 8.9–10.3)
CO2: 31 mmol/L (ref 22–32)
CREATININE: 0.88 mg/dL (ref 0.44–1.00)
Chloride: 99 mmol/L — ABNORMAL LOW (ref 101–111)
GFR calc non Af Amer: >60 mL/min (ref >60)
Glucose, Bld: 146 mg/dL — ABNORMAL HIGH (ref 65–99)
Potassium: 3 mmol/L — ABNORMAL LOW (ref 3.5–5.1)
SODIUM: 140 mmol/L (ref 135–145)

## 2014-12-07 LAB — CBC
HCT: 34.7 % — ABNORMAL LOW (ref 36.0–46.0)
HEMOGLOBIN: 12 g/dL (ref 12.0–15.0)
MCH: 36 pg — ABNORMAL HIGH (ref 26.0–34.0)
MCHC: 34.6 g/dL (ref 30.0–36.0)
MCV: 104.2 fL — ABNORMAL HIGH (ref 78.0–100.0)
Platelets: 416 10*3/uL — ABNORMAL HIGH (ref 150–400)
RBC: 3.33 MIL/uL — ABNORMAL LOW (ref 3.87–5.11)
RDW: 15.3 % (ref 11.5–15.5)
WBC: 8.8 10*3/uL (ref 4.0–10.5)

## 2014-12-07 LAB — APTT: APTT: 29 s (ref 24–37)

## 2014-12-07 LAB — PROTIME-INR
INR: 1.09 (ref 0.00–1.49)
PROTHROMBIN TIME: 14.3 s (ref 11.6–15.2)

## 2014-12-07 NOTE — Patient Instructions (Signed)
Your procedure is scheduled on: December 09, 2014    Enter through the Main Entrance of [facility] at:  6:00 am   Pick up the phone at the desk and dial [phone].  Call this number if you have problems the morning of surgery: [phone].  Remember: Do NOT eat food:  After midnight on Wednesday  Do NOT drink clear liquids after:  After midnight on Wednesday  Take these medicines the morning of surgery with a SIP OF WATER:  Lasix, Imdur, and prilosec   Do NOT wear jewelry (body piercing), metal hair clips/bobby pins, make-up, or nail polish. Do NOT wear lotions, powders, or perfumes.  You may wear deoderant. Do NOT shave for 48 hours prior to surgery. Do NOT bring valuables to the hospital. Contacts, dentures, or bridgework may not be worn into surgery. Leave suitcase in car.  After surgery it may be brought to your room.  For patients admitted to the hospital, checkout time is 11:00 AM the day of discharge.

## 2014-12-08 NOTE — H&P (Signed)
History of Present Illness   I was consulted by Darlys Gales, DO regarding Ms Stefanik's worsening prolapse over the last 2 years based on pelvic examination and symptoms. She can feel vaginal bulging and sometimes she reduces it. She has not had a hysterectomy. She does not do any splinting maneuvers.   She has no neuromodulation treatments and her bowel function is normal. She is sexually active. Her symptoms have not been medically treated.    I saw Ms Turbyfill on her first visit. She has rare urge incontinence, sometimes foot-on-the-floor syndrome. She has worse stress incontinence but does not wear a pad. She had a grade 3 cystocele with significant central defect. The vaginal length was good and extended approximately 2 cm. She had a diffuse grade 2 rectocele. She has no stress incontinence with hypermobility of the bladder neck.   I feel if she ever had surgery she would likely benefit from a cystocele repair and probably a vault suspension and/or a 4-corner graft and likely a posterior repair. She is here to discuss urodynamics.   Uroflowmetry: She voided 220 mL with a maximum flow of 14 mL/s. She was catheterized for 0 mL. Her maximum capacity was 525 mL. Her bladder was stable. She did not leak with Valsalva pressure with 94 cmH2O. She did not leak with or without the prolapse reduced. During voluntary voiding she voided 500 mL with a maximum flow of 10 mL/s. Maximum voiding pressure of 22 cmH2O. She emptied efficiently. EMG activity was normal. The bladder neck extended 2-3 cm and fluoroscopically she had a bladder cystocele noted. The details of urodynamics are signed and dictated on the urodynamics sheet.    Past Medical History Problems  1. History of CAD (coronary artery disease) (I25.10) 2. History of High cholesterol (E78.0) 3. History of esophageal reflux (Z87.19) 4. History of hypertension (Z86.79) 5. History of malignant neoplasm of breast (Z85.3)  Surgical History Problems  1.  History of Appendectomy 2. History of Breast Surgery Mastectomy 3. History of Drainage Of Ovarian Cyst(S) 4. History of Heart Surgery  Current Meds 1. Aspirin 81 MG TABS;  Therapy: (Recorded:05Apr2016) to Recorded 2. Avalide 150-12.5 MG Oral Tablet;  Therapy: (Recorded:05Apr2016) to Recorded 3. Boniva 150 MG Oral Tablet;  Therapy: (Recorded:05Apr2016) to Recorded 4. Calcium 600 MG Oral Tablet;  Therapy: (Recorded:05Apr2016) to Recorded 5. Crestor 40 MG Oral Tablet;  Therapy: (Recorded:05Apr2016) to Recorded 6. Diltiazem CD 240 MG Oral Capsule Extended Release 24 Hour;  Therapy: (Recorded:05Apr2016) to Recorded 7. Hydroxyurea 500 MG Oral Capsule;  Therapy: (Recorded:05Apr2016) to Recorded 8. Imdur 60 MG TB24;  Therapy: (Recorded:05Apr2016) to Recorded 9. Lasix 20 MG Oral Tablet;  Therapy: (Recorded:05Apr2016) to Recorded 10. PriLOSEC 20 MG Oral Capsule Delayed Release;   Therapy: (Recorded:05Apr2016) to Recorded 11. Vitamin D3 CAPS;   Therapy: (Recorded:05Apr2016) to Recorded  Allergies Medication  1. Penicillins  Family History Problems  1. Family history of cardiac disorder (Z82.49) : Mother, Father 2. Family history of hypertension (Z82.49) : Mother, Father, Sister 3. Family history of kidney stones (Z84.1) : Mother 4. Family history of malignant neoplasm (Z80.9) : Daughter 58. Family history of myocardial infarction (Z82.49) : Father 6. Family history of Microscopic hematuria : Mother  Social History Problems  1. Alcohol use (Z78.9)   1 glass of wine at dinner 2. Current smoker (F17.200)   1/2 pack per day for over 20 years as of 07/19/14 office visit 3. Daily caffeine consumption   1 - 2 per day 4. Death in the  family, father   age 44 due to heart attack 5. Divorced 6. Retired   Therapist, sports 7. Two children   2 daughters  Assessment Assessed  1. Cystocele, midline (N81.11) 2. Rectocele, female (N81.6)  Plan Cystocele, midline  1. Gynecology Referral  Referral  Referral  Status: Hold For - Appointment,Records   Requested for: [id number] Cystocele, midline, Rectocele, female, Urge and stress incontinence  2. Follow-up PRN Office  Follow-up  Status: Hold For - Appointment  Requested for:  [id number]  Discussion/Summary   I drew Ms Blake a picture and talked to her about watchful waiting versus a pessary versus a transvaginal cystocele repair and probably a 4-corner graft with vault suspension and probably a rectocele repair.   I drew her a picture and we talked about prolapse surgery in detail. Pros, cons, general surgical and anesthetic risks, and other options including behavioral therapy, pessaries, and watchful waiting were discussed. She understands that prolapse repairs are successful in 80-85% of cases for prolapse symptoms and can recur anteriorly, posteriorly, and/or apically. She understands that in most cases I use a graft and general risks were discussed. Surgical risks were described but not limited to the discussion of injury to neighboring structures including the bowel (with possible life-threatening sepsis and colostomy), bladder, urethra, vagina (all resulting in further surgery), and ureter (resulting in re-implantation). We talked about injury to nerves/soft tissue leading to debilitating and intractable pelvic, abdominal, and lower extremity pain syndromes and neuropathies. The risks of buttock pain, intractable dyspareunia, and vaginal narrowing and shortening with sequelae were discussed. Bleeding risks, transfusion rates, and infection were discussed. The risk of persistent, de novo, or worsening bladder and/or bowel incontinence/dysfunction was discussed. The need for CIC was described as well the usual post-operative course. The patient understands that she might not reach her treatment goal and that she might be worse following surgery.  She understands that she may have a mild outlet abnormality and mild overactivity but I do  not recommend a sling at this stage. The risk of worsening incontinence discussed. The future role of medical and behavioral therapy discussed.   Mesh issues discussed. Ms Kuechle would like to proceed with surgery. She would like to visit with Dr Newt Lukes to discuss a hysterectomy. We will proceed accordingly.  After a thorough review of the management options for the patient's condition the patient  elected to proceed with surgical therapy as noted above. We have discussed the potential benefits and risks of the procedure, side effects of the proposed treatment, the likelihood of the patient achieving the goals of the procedure, and any potential problems that might occur during the procedure or recuperation. Informed consent has been obtained.

## 2014-12-09 ENCOUNTER — Ambulatory Visit (HOSPITAL_COMMUNITY): Payer: Medicare Other | Admitting: Anesthesiology

## 2014-12-09 ENCOUNTER — Ambulatory Visit (HOSPITAL_COMMUNITY)
Admission: RE | Admit: 2014-12-09 | Discharge: 2014-12-10 | Disposition: A | Discharge status: Home / Self Care | Payer: Medicare Other | Source: Ambulatory Visit | Attending: Obstetrics and Gynecology | Admitting: Obstetrics and Gynecology

## 2014-12-09 ENCOUNTER — Encounter (HOSPITAL_COMMUNITY): Payer: Self-pay | Admitting: Anesthesiology

## 2014-12-09 ENCOUNTER — Encounter (HOSPITAL_COMMUNITY)
Admission: RE | Disposition: A | Discharge status: Home / Self Care | Payer: Self-pay | Source: Ambulatory Visit | Attending: Obstetrics and Gynecology

## 2014-12-09 DIAGNOSIS — Z86010 Personal history of colonic polyps: Secondary | ICD-10-CM | POA: Insufficient documentation

## 2014-12-09 DIAGNOSIS — E876 Hypokalemia: Secondary | ICD-10-CM | POA: Insufficient documentation

## 2014-12-09 DIAGNOSIS — N9489 Other specified conditions associated with female genital organs and menstrual cycle: Secondary | ICD-10-CM | POA: Insufficient documentation

## 2014-12-09 DIAGNOSIS — Z9221 Personal history of antineoplastic chemotherapy: Secondary | ICD-10-CM | POA: Insufficient documentation

## 2014-12-09 DIAGNOSIS — N816 Rectocele: Secondary | ICD-10-CM | POA: Diagnosis not present

## 2014-12-09 DIAGNOSIS — K219 Gastro-esophageal reflux disease without esophagitis: Secondary | ICD-10-CM | POA: Insufficient documentation

## 2014-12-09 DIAGNOSIS — Z880 Allergy status to penicillin: Secondary | ICD-10-CM | POA: Insufficient documentation

## 2014-12-09 DIAGNOSIS — I2510 Atherosclerotic heart disease of native coronary artery without angina pectoris: Secondary | ICD-10-CM | POA: Insufficient documentation

## 2014-12-09 DIAGNOSIS — F17210 Nicotine dependence, cigarettes, uncomplicated: Secondary | ICD-10-CM | POA: Insufficient documentation

## 2014-12-09 DIAGNOSIS — N3946 Mixed incontinence: Secondary | ICD-10-CM | POA: Diagnosis not present

## 2014-12-09 DIAGNOSIS — R079 Chest pain, unspecified: Secondary | ICD-10-CM | POA: Insufficient documentation

## 2014-12-09 DIAGNOSIS — N814 Uterovaginal prolapse, unspecified: Secondary | ICD-10-CM | POA: Diagnosis not present

## 2014-12-09 DIAGNOSIS — Z90710 Acquired absence of both cervix and uterus: Secondary | ICD-10-CM | POA: Diagnosis present

## 2014-12-09 DIAGNOSIS — D473 Essential (hemorrhagic) thrombocythemia: Secondary | ICD-10-CM | POA: Insufficient documentation

## 2014-12-09 DIAGNOSIS — E785 Hyperlipidemia, unspecified: Secondary | ICD-10-CM | POA: Insufficient documentation

## 2014-12-09 DIAGNOSIS — Z7982 Long term (current) use of aspirin: Secondary | ICD-10-CM | POA: Insufficient documentation

## 2014-12-09 DIAGNOSIS — I10 Essential (primary) hypertension: Secondary | ICD-10-CM | POA: Insufficient documentation

## 2014-12-09 DIAGNOSIS — Z853 Personal history of malignant neoplasm of breast: Secondary | ICD-10-CM | POA: Insufficient documentation

## 2014-12-09 DIAGNOSIS — Z9012 Acquired absence of left breast and nipple: Secondary | ICD-10-CM | POA: Insufficient documentation

## 2014-12-09 DIAGNOSIS — E780 Pure hypercholesterolemia: Secondary | ICD-10-CM | POA: Insufficient documentation

## 2014-12-09 HISTORY — PX: VAGINAL HYSTERECTOMY: SHX2639

## 2014-12-09 HISTORY — PX: SALPINGOOPHORECTOMY: SHX82

## 2014-12-09 HISTORY — PX: CYSTOSCOPY: SHX5120

## 2014-12-09 HISTORY — PX: ANTERIOR AND POSTERIOR REPAIR: SHX5121

## 2014-12-09 SURGERY — HYSTERECTOMY, VAGINAL
Anesthesia: General | Site: Vagina

## 2014-12-09 MED ORDER — MIDAZOLAM HCL 2 MG/2ML IJ SOLN
INTRAMUSCULAR | Status: DC | PRN
  Administered 2014-12-09: 1 mg via INTRAVENOUS

## 2014-12-09 MED ORDER — ONDANSETRON HCL 4 MG/2ML IJ SOLN
INTRAMUSCULAR | Status: DC | PRN
  Administered 2014-12-09: 4 mg via INTRAVENOUS

## 2014-12-09 MED ORDER — LIDOCAINE-EPINEPHRINE (PF) 1 %-1:200000 IJ SOLN
INTRAMUSCULAR | Status: DC | PRN
  Administered 2014-12-09: 20 mL
  Administered 2014-12-09 (×2): 16 mL

## 2014-12-09 MED ORDER — PROPOFOL 10 MG/ML IV BOLUS
INTRAVENOUS | Status: AC
  Filled 2014-12-09: qty 20

## 2014-12-09 MED ORDER — 0.9 % SODIUM CHLORIDE (POUR BTL) OPTIME
TOPICAL | Status: DC | PRN
  Administered 2014-12-09: 1000 mL

## 2014-12-09 MED ORDER — ESMOLOL HCL 10 MG/ML IV SOLN
INTRAVENOUS | Status: DC | PRN
  Administered 2014-12-09: 20 mg via INTRAVENOUS

## 2014-12-09 MED ORDER — LIDOCAINE HCL (CARDIAC) 20 MG/ML IV SOLN
INTRAVENOUS | Status: DC | PRN
  Administered 2014-12-09: 30 mg via INTRAVENOUS
  Administered 2014-12-09: 70 mg via INTRAVENOUS

## 2014-12-09 MED ORDER — FLEET ENEMA 7-19 GM/118ML RE ENEM: 1.0000 | ENEMA | Freq: Once | RECTAL | Status: DC

## 2014-12-09 MED ORDER — ESTRADIOL 0.1 MG/GM VA CREA
TOPICAL_CREAM | VAGINAL | Status: DC | PRN
Start: 2014-12-09 — End: 2014-12-09
  Administered 2014-12-09: 1 via VAGINAL

## 2014-12-09 MED ORDER — GENTAMICIN IN SALINE 1.6-0.9 MG/ML-% IV SOLN
80.0000 mg | INTRAVENOUS | Status: DC
Start: 2014-12-09 — End: 2014-12-09

## 2014-12-09 MED ORDER — IRBESARTAN-HYDROCHLOROTHIAZIDE 150-12.5 MG PO TABS: 1.0000 | ORAL_TABLET | Freq: Every day | ORAL | Status: DC

## 2014-12-09 MED ORDER — CIPROFLOXACIN IN D5W 400 MG/200ML IV SOLN
400.0000 mg | INTRAVENOUS | Status: AC
  Administered 2014-12-09: 400 mg via INTRAVENOUS
  Filled 2014-12-09: qty 200

## 2014-12-09 MED ORDER — PHENYLEPHRINE HCL 10 MG/ML IJ SOLN
INTRAMUSCULAR | Status: DC | PRN
  Administered 2014-12-09: 120 ug via INTRAVENOUS
  Administered 2014-12-09: 80 ug via INTRAVENOUS
  Administered 2014-12-09: 120 ug via INTRAVENOUS
  Administered 2014-12-09: 80 ug via INTRAVENOUS

## 2014-12-09 MED ORDER — ONDANSETRON HCL 4 MG/2ML IJ SOLN
INTRAMUSCULAR | Status: AC
  Filled 2014-12-09: qty 2

## 2014-12-09 MED ORDER — KETOROLAC TROMETHAMINE 30 MG/ML IJ SOLN
INTRAMUSCULAR | Status: AC
  Filled 2014-12-09: qty 1

## 2014-12-09 MED ORDER — KETOROLAC TROMETHAMINE 30 MG/ML IJ SOLN: 30.0000 mg | Freq: Four times a day (QID) | INTRAMUSCULAR | Status: DC

## 2014-12-09 MED ORDER — ACETAMINOPHEN 160 MG/5ML PO SOLN
650.0000 mg | Freq: Once | ORAL | Status: AC
Start: 2014-12-09 — End: 2014-12-09
  Administered 2014-12-09: 650 mg via ORAL

## 2014-12-09 MED ORDER — EPHEDRINE 5 MG/ML INJ
INTRAVENOUS | Status: AC
  Filled 2014-12-09: qty 10

## 2014-12-09 MED ORDER — ONDANSETRON HCL 4 MG/2ML IJ SOLN: 4.0000 mg | Freq: Four times a day (QID) | INTRAMUSCULAR | Status: DC | PRN

## 2014-12-09 MED ORDER — FUROSEMIDE 10 MG/ML IJ SOLN
INTRAMUSCULAR | Status: AC
  Filled 2014-12-09: qty 2

## 2014-12-09 MED ORDER — MIDAZOLAM HCL 2 MG/2ML IJ SOLN
INTRAMUSCULAR | Status: AC
  Filled 2014-12-09: qty 4

## 2014-12-09 MED ORDER — IRBESARTAN 150 MG PO TABS
150.0000 mg | ORAL_TABLET | Freq: Every day | ORAL | Status: DC
  Filled 2014-12-09: qty 1

## 2014-12-09 MED ORDER — KETOROLAC TROMETHAMINE 0.5 % OP SOLN
1.0000 [drp] | Freq: Three times a day (TID) | OPHTHALMIC | Status: DC | PRN
  Administered 2014-12-09 – 2014-12-10 (×2): 1 [drp] via OPHTHALMIC
  Filled 2014-12-09: qty 3

## 2014-12-09 MED ORDER — GLYCOPYRROLATE 0.2 MG/ML IJ SOLN
INTRAMUSCULAR | Status: DC | PRN
  Administered 2014-12-09: 0.6 mg via INTRAVENOUS

## 2014-12-09 MED ORDER — LIDOCAINE HCL (CARDIAC) 20 MG/ML IV SOLN
INTRAVENOUS | Status: AC
  Filled 2014-12-09: qty 5

## 2014-12-09 MED ORDER — SIMETHICONE 80 MG PO CHEW: 80.0000 mg | CHEWABLE_TABLET | Freq: Four times a day (QID) | ORAL | Status: DC | PRN

## 2014-12-09 MED ORDER — DEXAMETHASONE SODIUM PHOSPHATE 10 MG/ML IJ SOLN
INTRAMUSCULAR | Status: DC | PRN
  Administered 2014-12-09: 4 mg via INTRAVENOUS

## 2014-12-09 MED ORDER — ONDANSETRON HCL 4 MG PO TABS: 4.0000 mg | ORAL_TABLET | Freq: Four times a day (QID) | ORAL | Status: DC | PRN

## 2014-12-09 MED ORDER — PANTOPRAZOLE SODIUM 40 MG PO TBEC
40.0000 mg | DELAYED_RELEASE_TABLET | Freq: Every day | ORAL | Status: DC
  Administered 2014-12-10: 40 mg via ORAL
  Filled 2014-12-09: qty 1

## 2014-12-09 MED ORDER — FUROSEMIDE 40 MG PO TABS
40.0000 mg | ORAL_TABLET | Freq: Every day | ORAL | Status: DC
  Filled 2014-12-09: qty 1

## 2014-12-09 MED ORDER — LACTATED RINGERS IV SOLN
INTRAVENOUS | Status: DC
  Administered 2014-12-09 (×3): via INTRAVENOUS

## 2014-12-09 MED ORDER — PROPOFOL 10 MG/ML IV BOLUS
INTRAVENOUS | Status: DC | PRN
  Administered 2014-12-09: 150 mg via INTRAVENOUS

## 2014-12-09 MED ORDER — ACETAMINOPHEN 160 MG/5ML PO SOLN
ORAL | Status: AC
  Administered 2014-12-09: 650 mg via ORAL
  Filled 2014-12-09: qty 40.6

## 2014-12-09 MED ORDER — PHENYLEPHRINE 40 MCG/ML (10ML) SYRINGE FOR IV PUSH (FOR BLOOD PRESSURE SUPPORT)
PREFILLED_SYRINGE | INTRAVENOUS | Status: AC
  Filled 2014-12-09: qty 20

## 2014-12-09 MED ORDER — ROCURONIUM BROMIDE 100 MG/10ML IV SOLN
INTRAVENOUS | Status: DC | PRN
  Administered 2014-12-09: 10 mg via INTRAVENOUS
  Administered 2014-12-09: 40 mg via INTRAVENOUS
  Administered 2014-12-09: 5 mg via INTRAVENOUS

## 2014-12-09 MED ORDER — HYDROMORPHONE HCL 1 MG/ML IJ SOLN
0.2000 mg | INTRAMUSCULAR | Status: DC | PRN
  Administered 2014-12-09: 0.6 mg via INTRAVENOUS
  Filled 2014-12-09: qty 1

## 2014-12-09 MED ORDER — LIDOCAINE-EPINEPHRINE (PF) 1 %-1:200000 IJ SOLN
INTRAMUSCULAR | Status: AC
  Filled 2014-12-09: qty 30

## 2014-12-09 MED ORDER — LIDOCAINE-EPINEPHRINE (PF) 1 %-1:200000 IJ SOLN
INTRAMUSCULAR | Status: AC
Start: 2014-12-09 — End: 2014-12-09
  Filled 2014-12-09: qty 30

## 2014-12-09 MED ORDER — SODIUM CHLORIDE 0.9 % IR SOLN
Status: DC | PRN
  Administered 2014-12-09: 500 mL

## 2014-12-09 MED ORDER — ESTRADIOL 0.1 MG/GM VA CREA
TOPICAL_CREAM | VAGINAL | Status: AC
  Filled 2014-12-09: qty 42.5

## 2014-12-09 MED ORDER — OXYCODONE-ACETAMINOPHEN 5-325 MG PO TABS: 1.0000 | ORAL_TABLET | ORAL | Status: DC | PRN

## 2014-12-09 MED ORDER — FENTANYL CITRATE (PF) 250 MCG/5ML IJ SOLN
INTRAMUSCULAR | Status: AC
  Filled 2014-12-09: qty 25

## 2014-12-09 MED ORDER — STERILE WATER FOR IRRIGATION IR SOLN
Status: DC | PRN
  Administered 2014-12-09 (×2): 1000 mL via INTRAVESICAL

## 2014-12-09 MED ORDER — GENTAMICIN SULFATE 40 MG/ML IJ SOLN
5.0000 mg/kg | Freq: Once | INTRAVENOUS | Status: DC
  Filled 2014-12-09: qty 7

## 2014-12-09 MED ORDER — METHYLENE BLUE 1 % INJ SOLN
INTRAMUSCULAR | Status: AC
  Filled 2014-12-09: qty 1

## 2014-12-09 MED ORDER — IBUPROFEN 800 MG PO TABS
800.0000 mg | ORAL_TABLET | Freq: Three times a day (TID) | ORAL | Status: DC | PRN
  Administered 2014-12-10: 800 mg via ORAL
  Filled 2014-12-09: qty 1

## 2014-12-09 MED ORDER — HYDROCHLOROTHIAZIDE 12.5 MG PO CAPS
12.5000 mg | ORAL_CAPSULE | Freq: Every day | ORAL | Status: DC
  Filled 2014-12-09: qty 1

## 2014-12-09 MED ORDER — HYDROMORPHONE HCL 1 MG/ML IJ SOLN: 0.2500 mg | INTRAMUSCULAR | Status: DC | PRN

## 2014-12-09 MED ORDER — NEOSTIGMINE METHYLSULFATE 10 MG/10ML IV SOLN
INTRAVENOUS | Status: AC
  Filled 2014-12-09: qty 1

## 2014-12-09 MED ORDER — KETOROLAC TROMETHAMINE 30 MG/ML IJ SOLN
30.0000 mg | Freq: Four times a day (QID) | INTRAMUSCULAR | Status: DC
  Administered 2014-12-09 (×2): 30 mg via INTRAVENOUS
  Filled 2014-12-09 (×2): qty 1

## 2014-12-09 MED ORDER — NEOSTIGMINE METHYLSULFATE 10 MG/10ML IV SOLN
INTRAVENOUS | Status: DC | PRN
  Administered 2014-12-09: 4 mg via INTRAVENOUS

## 2014-12-09 MED ORDER — ISOSORBIDE MONONITRATE ER 60 MG PO TB24
60.0000 mg | ORAL_TABLET | Freq: Every day | ORAL | Status: DC
  Filled 2014-12-09: qty 1

## 2014-12-09 MED ORDER — PHENAZOPYRIDINE HCL 200 MG PO TABS
200.0000 mg | ORAL_TABLET | Freq: Once | ORAL | Status: AC
  Administered 2014-12-09: 200 mg via ORAL
  Filled 2014-12-09: qty 1

## 2014-12-09 MED ORDER — SODIUM CHLORIDE 0.9 % IR SOLN
Freq: Once | Status: DC
  Filled 2014-12-09: qty 1

## 2014-12-09 MED ORDER — GLYCOPYRROLATE 0.2 MG/ML IJ SOLN
INTRAMUSCULAR | Status: AC
  Filled 2014-12-09: qty 3

## 2014-12-09 MED ORDER — MENTHOL 3 MG MT LOZG: 1.0000 | LOZENGE | OROMUCOSAL | Status: DC | PRN

## 2014-12-09 MED ORDER — DEXTROSE IN LACTATED RINGERS 5 % IV SOLN
INTRAVENOUS | Status: DC
  Administered 2014-12-09: 125 mL/h via INTRAVENOUS

## 2014-12-09 MED ORDER — ROCURONIUM BROMIDE 100 MG/10ML IV SOLN
INTRAVENOUS | Status: AC
  Filled 2014-12-09: qty 1

## 2014-12-09 MED ORDER — DILTIAZEM HCL ER COATED BEADS 240 MG PO CP24
240.0000 mg | ORAL_CAPSULE | Freq: Every day | ORAL | Status: DC
  Filled 2014-12-09: qty 1

## 2014-12-09 MED ORDER — DEXAMETHASONE SODIUM PHOSPHATE 4 MG/ML IJ SOLN
INTRAMUSCULAR | Status: AC
  Filled 2014-12-09: qty 1

## 2014-12-09 MED ORDER — EPHEDRINE SULFATE 50 MG/ML IJ SOLN
INTRAMUSCULAR | Status: DC | PRN
  Administered 2014-12-09: 10 mg via INTRAVENOUS
  Administered 2014-12-09 (×2): 20 mg via INTRAVENOUS
  Administered 2014-12-09 (×3): 10 mg via INTRAVENOUS
  Administered 2014-12-09: 20 mg via INTRAVENOUS

## 2014-12-09 MED ORDER — BUPIVACAINE HCL (PF) 0.25 % IJ SOLN
INTRAMUSCULAR | Status: AC
  Filled 2014-12-09: qty 30

## 2014-12-09 MED ORDER — ESMOLOL HCL 10 MG/ML IV SOLN
INTRAVENOUS | Status: AC
  Filled 2014-12-09: qty 10

## 2014-12-09 MED ORDER — BSS IO SOLN
15.0000 mL | INTRAOCULAR | Status: DC | PRN
  Administered 2014-12-09: 15 mL
  Filled 2014-12-09: qty 15

## 2014-12-09 MED ORDER — FENTANYL CITRATE (PF) 100 MCG/2ML IJ SOLN
INTRAMUSCULAR | Status: DC | PRN
  Administered 2014-12-09: 50 ug via INTRAVENOUS
  Administered 2014-12-09 (×3): 25 ug via INTRAVENOUS
  Administered 2014-12-09: 50 ug via INTRAVENOUS

## 2014-12-09 MED ORDER — CLINDAMYCIN PHOSPHATE 900 MG/50ML IV SOLN
900.0000 mg | INTRAVENOUS | Status: AC
  Administered 2014-12-09: 900 mg via INTRAVENOUS
  Filled 2014-12-09: qty 50

## 2014-12-09 SURGICAL SUPPLY — 58 items
BLADE SURG 15 STRL LF C SS BP (BLADE) ×4 IMPLANT
BLADE SURG 15 STRL SS (BLADE) ×2
CANISTER SUCT 3000ML (MISCELLANEOUS) ×18 IMPLANT
CATH FOLEY 2WAY SLVR  5CC 16FR (CATHETERS) ×2
CATH FOLEY 2WAY SLVR 5CC 16FR (CATHETERS) ×4 IMPLANT
CATH ROBINSON RED A/P 16FR (CATHETERS) ×6 IMPLANT
CLOTH BEACON ORANGE TIMEOUT ST (SAFETY) ×6 IMPLANT
CONT PATH 16OZ SNAP LID 3702 (MISCELLANEOUS) ×6 IMPLANT
CONTAINER PREFILL 10% NBF 60ML (FORM) IMPLANT
DECANTER SPIKE VIAL GLASS SM (MISCELLANEOUS) ×12 IMPLANT
DEVICE CAPIO SLIM SINGLE (INSTRUMENTS) IMPLANT
DRAIN PENROSE 1/4X12 LTX (DRAIN) ×12 IMPLANT
DRAPE STERI URO 9X17 APER PCH (DRAPES) ×6 IMPLANT
DRAPE UNDERBUTTOCKS STRL (DRAPE) ×6 IMPLANT
ELECT LIGASURE SHORT 9 REUSE (ELECTRODE) ×6 IMPLANT
GAUZE PACKING 2X5 YD STRL (GAUZE/BANDAGES/DRESSINGS) ×6 IMPLANT
GAUZE SPONGE 4X4 16PLY XRAY LF (GAUZE/BANDAGES/DRESSINGS) ×12 IMPLANT
GLOVE BIO SURGEON STRL SZ7.5 (GLOVE) ×6 IMPLANT
GLOVE BIO SURGEON STRL SZ8 (GLOVE) ×18 IMPLANT
GLOVE BIOGEL PI IND STRL 6.5 (GLOVE) ×4 IMPLANT
GLOVE BIOGEL PI IND STRL 7.0 (GLOVE) ×8 IMPLANT
GLOVE BIOGEL PI INDICATOR 6.5 (GLOVE) ×2
GLOVE BIOGEL PI INDICATOR 7.0 (GLOVE) ×4
GLOVE ECLIPSE 6.5 STRL STRAW (GLOVE) ×12 IMPLANT
GOWN STRL REUS W/TWL LRG LVL3 (GOWN DISPOSABLE) ×72 IMPLANT
LIQUID BAND (GAUZE/BANDAGES/DRESSINGS) IMPLANT
NEEDLE HYPO 22GX1.5 SAFETY (NEEDLE) ×6 IMPLANT
NEEDLE MAYO 6 CRC TAPER PT (NEEDLE) IMPLANT
NEEDLE SPNL 22GX3.5 QUINCKE BK (NEEDLE) ×6 IMPLANT
NS IRRIG 1000ML POUR BTL (IV SOLUTION) ×12 IMPLANT
PACK VAGINAL WOMENS (CUSTOM PROCEDURE TRAY) ×6 IMPLANT
PAD OB MATERNITY 4.3X12.25 (PERSONAL CARE ITEMS) ×6 IMPLANT
PENCIL BUTTON HOLSTER BLD 10FT (ELECTRODE) ×6 IMPLANT
PLUG CATH AND CAP STER (CATHETERS) ×6 IMPLANT
RETRACTOR STAY HOOK 5MM (MISCELLANEOUS) ×12 IMPLANT
SET CYSTO W/LG BORE CLAMP LF (SET/KITS/TRAYS/PACK) ×6 IMPLANT
SHEET LAVH (DRAPES) ×6 IMPLANT
SLEEVE SURGEON STRL (DRAPES) ×6 IMPLANT
SUT CAPIO ETHIBPND (SUTURE) IMPLANT
SUT SILK 2 0 FS (SUTURE) IMPLANT
SUT VIC AB 0 CT1 27 (SUTURE) ×20
SUT VIC AB 0 CT1 27XBRD ANBCTR (SUTURE) ×28 IMPLANT
SUT VIC AB 0 CT1 27XCR 8 STRN (SUTURE) ×12 IMPLANT
SUT VIC AB 0 CT2 27 (SUTURE) IMPLANT
SUT VIC AB 2-0 CT1 (SUTURE) ×24 IMPLANT
SUT VIC AB 2-0 SH 27 (SUTURE) ×8
SUT VIC AB 2-0 SH 27XBRD (SUTURE) ×16 IMPLANT
SUT VIC AB 3-0 CT1 27 (SUTURE) ×4
SUT VIC AB 3-0 CT1 TAPERPNT 27 (SUTURE) ×8 IMPLANT
SUT VIC AB 3-0 PS2 18 (SUTURE)
SUT VIC AB 3-0 PS2 18XBRD (SUTURE) IMPLANT
SUT VICRYL 0 TIES 12 18 (SUTURE) IMPLANT
SUT VICRYL 0 UR6 27IN ABS (SUTURE) ×6 IMPLANT
TOWEL OR 17X24 6PK STRL BLUE (TOWEL DISPOSABLE) ×24 IMPLANT
TRAY FOLEY CATH SILVER 14FR (SET/KITS/TRAYS/PACK) ×6 IMPLANT
TUBING NON-CON 1/4 X 20 CONN (TUBING) ×5 IMPLANT
TUBING NON-CON 1/4 X 20' CONN (TUBING) ×1
WATER STERILE IRR 1000ML POUR (IV SOLUTION) IMPLANT

## 2014-12-09 NOTE — Addendum Note (Signed)
Addendum  created 12/09/14 1441 by Jillyn Hidden, MD   Modules edited: Orders, PRL Based Order Sets

## 2014-12-09 NOTE — Progress Notes (Signed)
Looks good No leg or vaginal pain Anesthesia assessed for left eye pain See in am

## 2014-12-09 NOTE — Addendum Note (Signed)
Addendum  created 12/09/14 1533 by Jonna Munro, CRNA   Modules edited: Notes Section   Notes Section:  File: [id number]

## 2014-12-09 NOTE — Anesthesia Preprocedure Evaluation (Signed)
Anesthesia Evaluation  Patient identified by MRN, date of birth, ID band Patient awake    Reviewed: Allergy & Precautions, H&P , Patient's Chart, lab work & pertinent test results, reviewed documented beta blocker date and time   Airway Mallampati: II  TM Distance: >3 FB Neck ROM: full    Dental no notable dental hx.    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 No evidence of ischemia/injury based upon normal troponin I. Chest discomfort could be tachycardia induced supply demand mismatch.  Palpitations    Pure hypercholesterolemia   Essential hypertension, benign    GERD (gastroesophageal reflux disease         Reproductive/Obstetrics                             Anesthesia Physical Anesthesia Plan  ASA: II  Anesthesia Plan: General   Post-op Pain Management:    Induction: Intravenous  Airway Management Planned: Oral ETT  Additional Equipment:   Intra-op Plan:   Post-operative Plan: Extubation in OR  Informed Consent: I have reviewed the patients History and Physical, chart, labs and discussed the procedure including the risks, benefits and alternatives for the proposed anesthesia with the patient or authorized representative who has indicated his/her understanding and acceptance.   Dental Advisory Given and Dental advisory given  Plan Discussed with: CRNA and Surgeon  Anesthesia Plan Comments: (  Discussed general anesthesia, including possible nausea,  instrumentation of airway, sore throat,pulmonary aspiration, etc. I asked if the were any outstanding questions, or  concerns before we proceeded. )        Anesthesia Quick Evaluation

## 2014-12-09 NOTE — Op Note (Signed)
Preoperative diagnosis: Cystocele and rectocele Postoperative diagnosis: Cystocele and rectocele Surgery: Cystocele repair and rectocele repair and cystoscopy Surgeon Dr. Nicki Reaper Chiyo Fay Assistant: Dr. Eduard Clos Resident assistant: Dr. Verdis Frederickson  The patient has the above diagnoses and consented to the above procedure. She also had uterine prolapse. Leg position was excellent to minimize the risk of compartment syndrome and neuropathy and deep vein thrombosis. Preoperative antibodies were given  Dr. Garwin Brothers did a transvaginal hysterectomy and removed both ovaries and tubes. She ran the posterior cuff. The ureteral sacral ligaments were very strong and high riding and tagged.  Following a midline incision, with the bladder empty I mobilized the overlying vaginal wall mucosa from the underlying pubocervical fascia to the white line bilaterally all the way to the high ureteral sacral ligaments. I used my Allis clamp technique. Double-ring retractor was utilized. I was very happy with the mobilization. I did a 2 layer anterior repair with running 20 Vicryls not imbricating the bladder neck and not distorting or shortening her anatomy  I cystoscoped the patient. She had excellent yellow jets bilaterally and cystoscopically a good repair. The ureteral orifices were very close to the bladder neck and very small. I think she was a little bit dehydrated but I finally saw an excellent jets on her left side  It was very obvious that she did not need a vault suspension. I trimmed an appropriate amount of anterior vaginal wall mucosa and closed the anterior vaginal wall with running 20 Vicryls on a CT1 needle.  Dr. cousins did a culdoplasty. I then closed the vaginal cuff from left to right and right to left with running 0 Vicryls and CT1 needle  Throughout the case she had a short anterior vaginal wall. She had some vaginal shortening though very good support. She had significant deficiency of the  posterior fourchette making a retractor difficult to sit. She had a moderate grade 2 rectocele that was quite diffuse and was obvious that it should be repaired. I was very cognizant of her already mild vaginal shortening.  As I did anteriorly I instilled 20 mL of a lidocaine epinephrine mixture. I placed an Allis clamp on the posterior fourchette bilaterally. It almost looked like she had a second posterior fourchette inside the introitus on the left side which was left in situ. I removed a small perineal triangle of skin. With my usual technique I made a long posterior vaginal wall incision and mobilized the thin vaginal wall mucosa from the underlying rectovaginal fascia. I mobilize very nicely at the apex. Again she did not have a lot of length.  Digital rectal examination demonstrated generalized thinning of the rectovaginal fascia with a central site defect.  Utilizing 20 Vicryl on a CT1 I did a posterior repair in 2 layers. She was not shortened or tightened and I was very happy with the anatomic repair. I trimmed a few millimeters of posterior vaginal wall mucosa and closed the posterior vaginal wall with running 20 Vicryls CT1 needle. I gave her excellent length with a rectocele repair. I did a gentle perineal repair also building up the area was 0 Vicryls. I exteriorize my Vicryls suture and used subcuticular in the perineal area. Rectal examination demonstrated an excellent repair with no rectal injury  Blood loss was less than 100 mL. Excellent urine output throughout the case was noted. Leg position was good. She did have some shortening of the vagina but the apex in my opinion will also soften. Vaginal pack with Estrace cream  was applied. I was very pleased with the postoperative result

## 2014-12-09 NOTE — Anesthesia Postprocedure Evaluation (Signed)
  Anesthesia Post-op Note  Patient: Ashley [name]  Procedure(s) Performed: Procedure(s): HYSTERECTOMY VAGINAL (N/A) SALPINGO OOPHORECTOMY (Bilateral) ANTERIOR (CYSTOCELE) AND POSTERIOR REPAIR (RECTOCELE) (N/A) CYSTOSCOPY (N/A) Patient is awake and responsive. Pain and nausea are reasonably well controlled. Vital signs are stable and clinically acceptable. Oxygen saturation is clinically acceptable. There are no apparent anesthetic complications at this time. Patient is ready for discharge.

## 2014-12-09 NOTE — Transfer of Care (Signed)
Immediate Anesthesia Transfer of Care Note  Patient: Ashley [name]  Procedure(s) Performed: Procedure(s): HYSTERECTOMY VAGINAL (N/A) SALPINGO OOPHORECTOMY (Bilateral) ANTERIOR (CYSTOCELE) AND POSTERIOR REPAIR (RECTOCELE) (N/A) CYSTOSCOPY (N/A)  Patient Location: PACU  Anesthesia Type:General  Level of Consciousness: awake, alert , oriented and patient cooperative  Airway & Oxygen Therapy: Patient Spontanous Breathing and Patient connected to nasal cannula oxygen  Post-op Assessment: Report given to RN and Post -op Vital signs reviewed and stable  Post vital signs: Reviewed and stable  Last Vitals:  Filed Vitals:   12/09/14 0611  BP: 97/64  Pulse: 81  Temp: 36.6 C  Resp: 16    Complications: No apparent anesthesia complications

## 2014-12-09 NOTE — H&P (Signed)
Ashley [name] is an 64 y.o. female. G@ P@ [facility] presents for TVHBSO 2nd to uterovaginal prolapse. Pt denies PMB. (+)urinary incontinence. (+) cystocele. Hx left breast cancer s/p mastectomy. Pt will also have additional concomitant surgery by Dr Matilde Sprang.   Pertinent Gynecological History: Menses: post-menopausal Bleeding: none Contraception: tubal ligation DES exposure: denies Blood transfusions: none Sexually transmitted diseases: no past history Previous GYN Procedures: ovarian cystectomy x2, tl  Last mammogram: normal Date: 2015  right Last pap: normal Date: 04/2014 OB History: G2P2   Menstrual History: Menarche age:n/a No LMP recorded. Patient is postmenopausal.    Past Medical History  Diagnosis Date  . Hypertension   . Hyperlipidemia   . Coronary artery disease   . Colon adenomas   . Cancer 1995    breast, left, mastectomy/chemo  . Thrombocytosis     Idiopathic  . Chest pain     Possibly cardiac. No evidence of ischemia/injury based upon normal troponin I. Chest discomfort could be tachycardia induced supply demand mismatch.   . Palpitations   . Pure hypercholesterolemia   . Essential hypertension, benign   . GERD (gastroesophageal reflux disease)     Past Surgical History  Procedure Laterality Date  . Mastectomy      left  . Appendectomy    . Ovarian cyst surgery      x2  . Breast surgery    . Cardiac catheterization    . Tubal ligation    . Colonoscopy  11/29/2010    Procedure: COLONOSCOPY;  Surgeon: Rogene Houston, MD;  Location: [facility];  Service: Endoscopy;  Laterality: N/A;  . Bone marrow aspiration  07/2012  . Bone marrow biopsy  07/2012  . Colonoscopy N/A 02/18/2014    Procedure: COLONOSCOPY;  Surgeon: Rogene Houston, MD;  Location: [facility];  Service: Endoscopy;  Laterality: N/A;  1030    Family History  Problem Relation Age of Onset  . Hypertension Mother   . Heart failure Mother   . Congestive Heart Failure Mother   . COPD Mother    . Pernicious anemia Mother   . Cancer Mother     lung  . Hypertension Father   . CAD Father   . Heart attack Father   . Hypertension Sister   . Cancer Other   . Celiac disease Other     Social History:  reports that she has been smoking Cigarettes.  She has a 20 pack-year smoking history. She has never used smokeless tobacco. She reports that she does not drink alcohol or use illicit drugs.  Allergies:  Allergies  Allergen Reactions  . Penicillins Hives and Rash   No latex allergy  Current facility-administered medications:  .  clindamycin (CLEOCIN) IVPB 900 mg, 900 mg, Intravenous, On Call to OR **AND** ciprofloxacin (CIPRO) IVPB 400 mg, 400 mg, Intravenous, On Call to OR, Servando Salina, MD .  phenazopyridine (PYRIDIUM) tablet 200 mg, 200 mg, Oral, Once, Bjorn Loser, MD  Current outpatient prescriptions:  .  aspirin 81 MG tablet, Take 81 mg by mouth daily.  , Disp: , Rfl:  .  Calcium Carb-Cholecalciferol (CALCIUM 600 + D) 600-200 MG-UNIT TABS, Take 1 tablet by mouth 2 (two) times daily with a meal., Disp: , Rfl:  .  cholecalciferol (VITAMIN D) 1000 UNITS tablet, Take 1,000 Units by mouth daily., Disp: , Rfl:  .  diltiazem (CARDIZEM CD) 240 MG 24 hr capsule, Take 240 mg by mouth daily.  , Disp: , Rfl:  .  furosemide (LASIX) 40 MG tablet, Take 40 mg by mouth daily.  , Disp: , Rfl:  .  hydroxyurea (HYDREA) 500 MG capsule, 500 mg on M-F and 1000 mg on S-S (Patient taking differently: 500 mg on M-W and 1000 mg on Thurs-Sun.), Disp: 50 capsule, Rfl: 3 .  ibandronate (BONIVA) 150 MG tablet, Take 1 tablet by mouth every 30 (thirty) days., Disp: , Rfl:  .  irbesartan-hydrochlorothiazide (AVALIDE) 150-12.5 MG per tablet, Take 1 tablet by mouth daily.  , Disp: , Rfl:  .  isosorbide mononitrate (IMDUR) 60 MG 24 hr tablet, Take 60 mg by mouth daily.  , Disp: , Rfl:  .  rosuvastatin (CRESTOR) 40 MG tablet, Take 40 mg by mouth daily.  , Disp: , Rfl:   Review of Systems  All other  systems reviewed and are negative.   There were no vitals taken for this visit. Physical Exam  Constitutional: She is oriented to person, place, and time. She appears well-developed and well-nourished.  Neck: Neck supple.  Cardiovascular: Normal rate and regular rhythm.   Respiratory: Effort normal.  GI: Soft.  Genitourinary: Vagina normal and uterus normal.  Musculoskeletal: Normal range of motion.  Neurological: She is alert and oriented to person, place, and time.  Skin: Skin is warm and dry.  vulva nl Vagina atrophic Cervix parous Uterus prolapsed 2nd  3rd cystocele Adnexa nonpalpable  CBC    Component Value Date/Time   WBC 8.8 12/07/2014 1045   RBC 3.33* 12/07/2014 1045   HGB 12.0 12/07/2014 1045   HCT 34.7* 12/07/2014 1045   PLT 416* 12/07/2014 1045   MCV 104.2* 12/07/2014 1045   MCH 36.0* 12/07/2014 1045   MCHC 34.6 12/07/2014 1045   RDW 15.3 12/07/2014 1045   LYMPHSABS 2.3 10/28/2012 1037   MONOABS 0.8 10/28/2012 1037   EOSABS 0.1 10/28/2012 1037   BASOSABS 0.0 10/28/2012 1037    BMP Latest Ref Rng 12/07/2014  Glucose 65 - 99 mg/dL 146(H)  BUN 6 - 20 mg/dL 13  Creatinine 0.44 - 1.00 mg/dL 0.88  Sodium 135 - 145 mmol/L 140  Potassium 3.5 - 5.1 mmol/L 3.0(L)  Chloride 101 - 111 mmol/L 99(L)  CO2 22 - 32 mmol/L 31  Calcium 8.9 - 10.3 mg/dL 9.1     Assessment/Plan: Uterovaginal prolapse Cystocele Hx right breast cancer Hypokalemia P) TVH BSO. Risk of surgery reviewed including infection, bleeding, poss need for blood transfusion, Injury to surrounding organ structures, inability to get adnexa, poss need for open route, bowel obstruction.  Replete potassium. ALL ? answered  Ashley [name] A 12/09/2014, 5:18 AM

## 2014-12-09 NOTE — Brief Op Note (Signed)
12/09/2014  12:04 PM  PATIENT:  Ashley [name]  64 y.o. female  PRE-OPERATIVE DIAGNOSIS:  Uterovaginal Prolapse, Cystocele  POST-OPERATIVE DIAGNOSIS:  Uterovaginal Prolapse, Cystocele,rectocele  PROCEDURE:  Procedure(s): HYSTERECTOMY VAGINAL (N/A) SALPINGO OOPHORECTOMY (Bilateral) ANTERIOR (CYSTOCELE) AND POSTERIOR REPAIR (RECTOCELE) (N/A) CYSTOSCOPY (N/A)  SURGEON:  Surgeon(s) and Role: Panel 1:    * Servando Salina, MD - Primary  Panel 2:    * Bjorn Loser, MD - Primary  PHYSICIAN ASSISTANT:   ASSISTANTS: Bjorn Loser,  MD  Verdis Frederickson, MD resident  ANESTHESIA:   general  EBL:  Total I/O In: 2300 [I.V.:2300] Out: 300 [Urine:200; Blood:100]  BLOOD ADMINISTERED:none  DRAINS: none   LOCAL MEDICATIONS USED:  LIDOCAINE   SPECIMEN:  Source of Specimen:  uterus with tubes and ovaries  DISPOSITION OF SPECIMEN:  PATHOLOGY  COUNTS:  YES  TOURNIQUET:  * No tourniquets in log *  DICTATION: .Other Dictation: Dictation Number [id number]  PLAN OF CARE: Admit for overnight observation  PATIENT DISPOSITION:  PACU - hemodynamically stable.   Delay start of Pharmacological VTE agent (>24hrs) due to surgical blood loss or risk of bleeding: no

## 2014-12-09 NOTE — Progress Notes (Signed)
Pt returned from O.R. Per stretcher complains of left eye pain .Eye is red ,slightly swollen, States eye feels strecthy and itching. Dr.Judd orders were received and carried.  1430 Later eye drops were applied,  Eye is less redden and irritated,

## 2014-12-09 NOTE — Anesthesia Procedure Notes (Signed)
Procedure Name: Intubation Date/Time: 12/09/2014 7:38 AM Performed by: Tobin Chad Pre-anesthesia Checklist: Patient identified, Timeout performed, Emergency Drugs available, Suction available and Patient being monitored Patient Re-evaluated:Patient Re-evaluated prior to inductionOxygen Delivery Method: Circle system utilized and Simple face mask Preoxygenation: Pre-oxygenation with 100% oxygen Intubation Type: IV induction Ventilation: Mask ventilation without difficulty Laryngoscope Size: Mac and 3 Grade View: Grade III Tube type: Oral Tube size: 7.0 mm Number of attempts: 1 Airway Equipment and Method: Stylet Placement Confirmation: positive ETCO2,  CO2 detector and breath sounds checked- equal and bilateral Secured at: 20 cm Tube secured with: Tape Dental Injury: Teeth and Oropharynx as per pre-operative assessment

## 2014-12-09 NOTE — Anesthesia Postprocedure Evaluation (Signed)
  Anesthesia Post-op Note  Patient: Ashley [name]  Procedure(s) Performed: Procedure(s): HYSTERECTOMY VAGINAL (N/A) SALPINGO OOPHORECTOMY (Bilateral) ANTERIOR (CYSTOCELE) AND POSTERIOR REPAIR (RECTOCELE) (N/A) CYSTOSCOPY (N/A)  Patient Location: [facility]  Anesthesia Type:General  Level of Consciousness: awake, alert  and oriented  Airway and Oxygen Therapy: Patient Spontanous Breathing and Patient connected to nasal cannula oxygen  Post-op Pain: none  Post-op Assessment: Post-op Vital signs reviewed, Patient's Cardiovascular Status Stable, Respiratory Function Stable, No signs of Nausea or vomiting, Adequate PO intake and Pain level controlled              Post-op Vital Signs: Reviewed and stable  Last Vitals:  Filed Vitals:   12/09/14 1330  BP: 92/58  Pulse: 74  Temp: 36.4 C  Resp: 16    Complications: Patient stable, C/O left eye pain and swelling, Dr. Jillyn Hidden notified, orders for analgesics and ice packs given.

## 2014-12-10 ENCOUNTER — Encounter (HOSPITAL_COMMUNITY): Payer: Self-pay | Admitting: Obstetrics and Gynecology

## 2014-12-10 DIAGNOSIS — N816 Rectocele: Secondary | ICD-10-CM | POA: Diagnosis not present

## 2014-12-10 LAB — BASIC METABOLIC PANEL
ANION GAP: 9 (ref 5–15)
BUN: 17 mg/dL (ref 6–20)
CO2: 27 mmol/L (ref 22–32)
Calcium: 8.9 mg/dL (ref 8.9–10.3)
Chloride: 98 mmol/L — ABNORMAL LOW (ref 101–111)
Creatinine, Ser: 0.99 mg/dL (ref 0.44–1.00)
GFR, EST NON AFRICAN AMERICAN: 58 mL/min — AB (ref >60)
Glucose, Bld: 122 mg/dL — ABNORMAL HIGH (ref 65–99)
POTASSIUM: 3.4 mmol/L — AB (ref 3.5–5.1)
SODIUM: 134 mmol/L — AB (ref 135–145)

## 2014-12-10 LAB — CBC
HCT: 30.8 % — ABNORMAL LOW (ref 36.0–46.0)
Hemoglobin: 11 g/dL — ABNORMAL LOW (ref 12.0–15.0)
MCH: 37.2 pg — ABNORMAL HIGH (ref 26.0–34.0)
MCHC: 35.7 g/dL (ref 30.0–36.0)
MCV: 104.1 fL — ABNORMAL HIGH (ref 78.0–100.0)
PLATELETS: 372 10*3/uL (ref 150–400)
RBC: 2.96 MIL/uL — AB (ref 3.87–5.11)
RDW: 15.7 % — ABNORMAL HIGH (ref 11.5–15.5)
WBC: 14.6 10*3/uL — AB (ref 4.0–10.5)

## 2014-12-10 MED ORDER — IBUPROFEN 800 MG PO TABS: 800.0000 mg | ORAL_TABLET | Freq: Three times a day (TID) | ORAL | Status: DC | PRN

## 2014-12-10 MED ORDER — OXYCODONE-ACETAMINOPHEN 5-325 MG PO TABS: 1.0000 | ORAL_TABLET | ORAL | Status: DC | PRN

## 2014-12-10 NOTE — Progress Notes (Signed)
Subjective: Patient reports tolerating PO, + flatus and no problems voiding.    Objective: I have reviewed patient's vital signs.  vital signs, intake and output and labs. Filed Vitals:   12/10/14 1000  BP: 97/43  Pulse: 75  Temp: 98.4 F (36.9 C)  Resp: 18   I/O last 3 completed shifts: In: 4112 [P.O.:840; I.V.:3272] Out: 1600 [Urine:1500; Blood:100] Total I/O In: -  Out: 275 [Urine:275]  Lab Results  Component Value Date   WBC 14.6* 12/10/2014   HGB 11.0* 12/10/2014   HCT 30.8* 12/10/2014   MCV 104.1* 12/10/2014   PLT 372 12/10/2014   Lab Results  Component Value Date   CREATININE 0.99 12/10/2014    EXAM General: alert, cooperative and no distress Resp: clear to auscultation bilaterally Cardio: regular rate and rhythm, S1, S2 normal, no murmur, click, rub or gallop GI: soft, non-tender; bowel sounds normal; no masses,  no organomegaly Extremities: no edema, redness or tenderness in the calves or thighs Vaginal Bleeding: minimal  Assessment: s/p Procedure(s): HYSTERECTOMY VAGINAL SALPINGO OOPHORECTOMY ANTERIOR (CYSTOCELE) AND POSTERIOR REPAIR (RECTOCELE) CYSTOSCOPY: stable, progressing well and tolerating diet  Plan: Advance diet Encourage ambulation Discontinue IV fluids Discharge home   d/c instructions reviewed F/u 6 weeks with Dr Garwin Brothers, dr Matilde Sprang per his instructions  Ashley [name] A, MD 12/10/2014 10:33 AM    12/10/2014, 10:33 AM

## 2014-12-10 NOTE — Op Note (Signed)
Ashley [name], Ashley [name]                   ACCOUNT NO.:  [id number]  MEDICAL RECORD NO.:  [id number]  LOCATION:  9305                          FACILITY:  [facility]  PHYSICIAN:  Servando Salina, M.D.DATE OF BIRTH:  [birth year]  DATE OF PROCEDURE:  12/09/2014 DATE OF DISCHARGE:                              OPERATIVE REPORT   PREOPERATIVE DIAGNOSIS:  Uterovaginal prolapse, cystocele.  PROCEDURE:  Total vaginal hysterectomy, bilateral salpingo-oophorectomy, and McCall culdoplasty.  POSTOPERATIVE DIAGNOSIS:  Uterovaginal prolapse, cystocele, and rectocele.  ANESTHESIA:  General.  SURGEON:  Servando Salina, M.D.  ASSISTANT:  Reece Packer, MD.  RESIDENT ASSISTANT:  Dr. Star Age.  PROCEDURE IN DETAIL:  Under adequate general anesthesia, the patient was positioned in dorsal lithotomy position.  Examination under anesthesia revealed slightly enlarged, mobile uterus.  No adnexal masses could be appreciated.  The patient was sterilely prepped and draped in usual fashion.  An indwelling Foley catheter was then placed.  A weighted speculum was placed in the vagina and Sims retractor was used anteriorly.  The cervix was grasped with a Jacobson clamp on the anterior and posterior lips.  The cervicovaginal junction was identified, and a dilute solution of lidocaine with epinephrine was injected circumferentially.  At that point, a circumferential incision was made at the cervicovaginal junction and the posterior cul-de-sac was subsequently opened transversely.  The vaginal cuff posteriorly was oversewn with 0 Vicryl running lock stitch.  The weighted speculum was then reinserted into the pelvis.  The uterosacral ligaments were bilaterally clamped, cut, and suture ligated with 0 Vicryl suture.  Attention was then turned to the anterior compartment.  The vaginal mucosa was lifted upwardly and with sharp dissection, the anterior cul-de-sac was subsequently opened with dissection of the  bladder off the lower segment of the uterus.  Slight thinning of the bladder wall was noted but it was intact.  Once the anterior cul-de-sac was entered, LigaSure was then used to serially clamped, cauterized, and then cut bilaterally the cardinal ligaments, uterine ovarian vessels until the cornual region was reached.  At that point, decision was then made to identify the left ovary which was grasped with a Babcock as well as the tube.   On that side with sidewall retractor, the mesosalpinx was serially clamped, cauterized, and then cut until the left tube and ovary were removed.  At that time, attention was then turned back to the right ovary and tube.  It was then noted that the ovary was adherent to some omental adhesion which was separately lysed off the ovary, and a similar procedure with removal of the right adnexa was then done.  Both the uterus with cervix, tubes and ovaries were then removed.  At that point, please see Dr. Mikle Bosworth dictation on his repair of the patient's cystocele and rectocele.  Once this repair was done, I used 0 Vicryl suture to perform McCall culdoplasty posteriorly with bringing the uterosacral ligaments in the midline.  He then closed the rectocele and the remaining vaginal cuff.  The vagina was then subsequently packed, please see his remaining dictation on the remainder of the case at which time, I had left prior to repair of the posterior  rectocele.  SPECIMEN:  Uterus, cervix with both tubes and ovaries sent to Pathology.  ESTIMATED BLOOD LOSS:  100 mL.  INTRAOPERATIVE FLUID:  2300 mL.  URINE OUTPUT:  200 mL, Pyridium-tinged fluid.  COUNTS:  Sponge and instrument counts x2 were correct.  COMPLICATION:  None.  The patient tolerated the procedure well and was transferred to the recovery in stable condition.     Servando Salina, M.D.     [place]/MEDQ  D:  12/09/2014  T:  12/10/2014  Job:  [id number]

## 2014-12-10 NOTE — Progress Notes (Signed)
Vitals and labs normal  Instructions given See orders

## 2014-12-21 NOTE — Discharge Summary (Signed)
Physician Discharge Summary  Patient ID: Ashley [name] MRN: [id number] DOB/AGE: [birth year] 64 y.o.  Admit date: 12/09/2014 Discharge date: 12/10/2014 Admission Diagnoses: uterovaginal prolapse, cystocele, rectocele  Discharge Diagnoses:  Active Problems:   Status post vaginal hysterectomy A/P repair, cystoscopy  Discharged Condition: stable  Hospital Course: pt underwent TVHBSO, A/P repair, cystoscopy. Uncomplicated postop course  Consults: None  Significant Diagnostic Studies: labs:  CBC    Component Value Date/Time   WBC 14.6* 12/10/2014 0518   RBC 2.96* 12/10/2014 0518   HGB 11.0* 12/10/2014 0518   HCT 30.8* 12/10/2014 0518   PLT 372 12/10/2014 0518   MCV 104.1* 12/10/2014 0518   MCH 37.2* 12/10/2014 0518   MCHC 35.7 12/10/2014 0518   RDW 15.7* 12/10/2014 0518   LYMPHSABS 2.3 10/28/2012 1037   MONOABS 0.8 10/28/2012 1037   EOSABS 0.1 10/28/2012 1037   BASOSABS 0.0 10/28/2012 1037    Lab Results  Component Value Date   CREATININE 0.99 12/10/2014   CREATININE 0.88 12/07/2014   Treatments: surgery: TVH BSO, A/P repair, cystoscopy  Discharge Exam: Blood pressure 97/43, pulse 75, temperature 98.4 F (36.9 C), temperature source Oral, resp. rate 18, height '5\' 2"'$  (1.575 m), weight 64.864 kg (143 lb), SpO2 95 %. General appearance: alert, cooperative and no distress Back: no tenderness to percussion or palpation Resp: clear to auscultation bilaterally Cardio: regular rate and rhythm, S1, S2 normal, no murmur, click, rub or gallop GI: soft, non-tender; bowel sounds normal; no masses,  no organomegaly Pelvic: deferred Extremities: extremities normal, atraumatic, no cyanosis or edema Skin: Skin color, texture, turgor normal. No rashes or lesions  Disposition: 01-Home or Self Care  Discharge Instructions    Diet - low sodium heart healthy    Complete by:  As directed      Discharge instructions    Complete by:  As directed   Call if temperature greater than equal to  100.4, nothing per vagina for 4-6 weeks or severe nausea vomiting, increased incisional pain , drainage or redness in the incision site, no straining with bowel movements, showers no bath     Discharge patient    Complete by:  As directed      May walk up steps    Complete by:  As directed      No wound care    Complete by:  As directed             Medication List    TAKE these medications        aspirin 81 MG tablet  Take 81 mg by mouth daily.     CALCIUM 600 + D 600-200 MG-UNIT Tabs  Generic drug:  Calcium Carb-Cholecalciferol  Take 1 tablet by mouth 2 (two) times daily with a meal.     cholecalciferol 1000 UNITS tablet  Commonly known as:  VITAMIN D  Take 1,000 Units by mouth daily.     diltiazem 240 MG 24 hr capsule  Commonly known as:  CARDIZEM CD  Take 240 mg by mouth daily.     furosemide 40 MG tablet  Commonly known as:  LASIX  Take 40 mg by mouth daily.     hydroxyurea 500 MG capsule  Commonly known as:  HYDREA  500 mg on M-F and 1000 mg on S-S     ibandronate 150 MG tablet  Commonly known as:  BONIVA  Take 1 tablet by mouth every 30 (thirty) days.     ibuprofen 800 MG tablet  Commonly known as:  ADVIL,MOTRIN  Take 1 tablet (800 mg total) by mouth every 8 (eight) hours as needed (mild pain).     irbesartan-hydrochlorothiazide 150-12.5 MG per tablet  Commonly known as:  AVALIDE  Take 1 tablet by mouth daily.     isosorbide mononitrate 60 MG 24 hr tablet  Commonly known as:  IMDUR  Take 60 mg by mouth daily.     oxyCODONE-acetaminophen 5-325 MG per tablet  Commonly known as:  PERCOCET/ROXICET  Take 1-2 tablets by mouth every 4 (four) hours as needed for severe pain (moderate to severe pain (when tolerating fluids)).     rosuvastatin 40 MG tablet  Commonly known as:  CRESTOR  Take 40 mg by mouth daily.           Follow-up Information    Follow up with Aseem Sessums A, MD In 6 weeks.   Specialty:  Obstetrics and Gynecology   Contact  information:   [address] [place] [address] [phone]       Follow up with MACDIARMID,SCOTT A, MD.   Specialty:  Urology   Why:  per his instruction   Contact information:   [place] [address] [phone]       Signed: Servando Salina, MD

## 2015-01-05 ENCOUNTER — Other Ambulatory Visit (HOSPITAL_COMMUNITY): Payer: Self-pay | Admitting: Nurse Practitioner

## 2015-01-05 DIAGNOSIS — R911 Solitary pulmonary nodule: Secondary | ICD-10-CM

## 2015-01-10 ENCOUNTER — Ambulatory Visit (HOSPITAL_COMMUNITY)
Admission: RE | Admit: 2015-01-10 | Discharge: 2015-01-10 | Disposition: A | Discharge status: Home / Self Care | Payer: Medicare Other | Source: Ambulatory Visit | Attending: Nurse Practitioner | Admitting: Nurse Practitioner

## 2015-01-10 DIAGNOSIS — Z853 Personal history of malignant neoplasm of breast: Secondary | ICD-10-CM | POA: Insufficient documentation

## 2015-01-10 DIAGNOSIS — R911 Solitary pulmonary nodule: Secondary | ICD-10-CM

## 2015-01-10 DIAGNOSIS — J439 Emphysema, unspecified: Secondary | ICD-10-CM | POA: Diagnosis not present

## 2015-01-10 DIAGNOSIS — I712 Thoracic aortic aneurysm, without rupture: Secondary | ICD-10-CM | POA: Insufficient documentation

## 2015-01-10 DIAGNOSIS — R918 Other nonspecific abnormal finding of lung field: Secondary | ICD-10-CM | POA: Diagnosis not present

## 2015-01-10 DIAGNOSIS — Z85820 Personal history of malignant melanoma of skin: Secondary | ICD-10-CM | POA: Diagnosis not present

## 2015-01-10 DIAGNOSIS — D3501 Benign neoplasm of right adrenal gland: Secondary | ICD-10-CM | POA: Insufficient documentation

## 2015-01-10 MED ORDER — IOHEXOL 300 MG/ML  SOLN
75.0000 mL | Freq: Once | INTRAMUSCULAR | Status: AC | PRN
  Administered 2015-01-10: 75 mL via INTRAVENOUS

## 2015-01-18 DIAGNOSIS — R918 Other nonspecific abnormal finding of lung field: Secondary | ICD-10-CM | POA: Insufficient documentation

## 2015-01-18 DIAGNOSIS — E876 Hypokalemia: Secondary | ICD-10-CM | POA: Insufficient documentation

## 2015-04-26 LAB — BONE MARROW EXAM

## 2015-05-12 ENCOUNTER — Other Ambulatory Visit (HOSPITAL_COMMUNITY): Payer: Self-pay | Admitting: Oncology

## 2015-05-12 DIAGNOSIS — Z1231 Encounter for screening mammogram for malignant neoplasm of breast: Secondary | ICD-10-CM

## 2015-05-18 ENCOUNTER — Ambulatory Visit (HOSPITAL_COMMUNITY)
Admission: RE | Admit: 2015-05-18 | Discharge: 2015-05-18 | Disposition: A | Discharge status: Home / Self Care | Payer: Medicare Other | Source: Ambulatory Visit | Attending: Oncology | Admitting: Oncology

## 2015-05-18 DIAGNOSIS — Z1231 Encounter for screening mammogram for malignant neoplasm of breast: Secondary | ICD-10-CM | POA: Diagnosis not present

## 2015-07-19 DIAGNOSIS — M5440 Lumbago with sciatica, unspecified side: Secondary | ICD-10-CM | POA: Insufficient documentation

## 2015-10-06 ENCOUNTER — Other Ambulatory Visit (HOSPITAL_COMMUNITY): Payer: Self-pay | Admitting: Oncology

## 2015-10-06 DIAGNOSIS — D473 Essential (hemorrhagic) thrombocythemia: Secondary | ICD-10-CM

## 2015-10-06 MED ORDER — HYDROXYUREA 500 MG PO CAPS: ORAL_CAPSULE | ORAL | Status: DC

## 2015-10-07 ENCOUNTER — Encounter: Payer: Self-pay | Admitting: Genetic Counselor

## 2015-10-11 ENCOUNTER — Other Ambulatory Visit (HOSPITAL_COMMUNITY): Payer: Self-pay | Admitting: Oncology

## 2015-10-11 DIAGNOSIS — D473 Essential (hemorrhagic) thrombocythemia: Secondary | ICD-10-CM

## 2015-11-04 ENCOUNTER — Other Ambulatory Visit: Payer: Self-pay | Admitting: Orthopedic Surgery

## 2015-11-08 ENCOUNTER — Ambulatory Visit (INDEPENDENT_AMBULATORY_CARE_PROVIDER_SITE_OTHER): Payer: Medicare Other | Admitting: Cardiovascular Disease

## 2015-11-08 ENCOUNTER — Encounter: Payer: Self-pay | Admitting: Cardiovascular Disease

## 2015-11-08 ENCOUNTER — Telehealth (HOSPITAL_COMMUNITY): Payer: Self-pay | Admitting: *Deleted

## 2015-11-08 ENCOUNTER — Encounter (HOSPITAL_COMMUNITY): Payer: Medicare Other | Attending: Hematology & Oncology

## 2015-11-08 ENCOUNTER — Other Ambulatory Visit (HOSPITAL_COMMUNITY): Payer: Self-pay | Admitting: Oncology

## 2015-11-08 VITALS — BP 100/62 | HR 74 | Ht 63.0 in | Wt 137.4 lb

## 2015-11-08 DIAGNOSIS — D473 Essential (hemorrhagic) thrombocythemia: Secondary | ICD-10-CM | POA: Insufficient documentation

## 2015-11-08 DIAGNOSIS — Z01810 Encounter for preprocedural cardiovascular examination: Secondary | ICD-10-CM

## 2015-11-08 DIAGNOSIS — E876 Hypokalemia: Secondary | ICD-10-CM

## 2015-11-08 DIAGNOSIS — I10 Essential (primary) hypertension: Secondary | ICD-10-CM

## 2015-11-08 LAB — COMPREHENSIVE METABOLIC PANEL
ALBUMIN: 3.9 g/dL (ref 3.5–5.0)
ALK PHOS: 57 U/L (ref 38–126)
ALT: 15 U/L (ref 14–54)
ANION GAP: 5 (ref 5–15)
AST: 25 U/L (ref 15–41)
BILIRUBIN TOTAL: 0.4 mg/dL (ref 0.3–1.2)
BUN: 14 mg/dL (ref 6–20)
CALCIUM: 8.8 mg/dL — AB (ref 8.9–10.3)
CO2: 28 mmol/L (ref 22–32)
Chloride: 100 mmol/L — ABNORMAL LOW (ref 101–111)
Creatinine, Ser: 0.79 mg/dL (ref 0.44–1.00)
GFR calc Af Amer: >60 mL/min (ref >60)
GFR calc non Af Amer: >60 mL/min (ref >60)
GLUCOSE: 148 mg/dL — AB (ref 65–99)
Potassium: 3.4 mmol/L — ABNORMAL LOW (ref 3.5–5.1)
SODIUM: 133 mmol/L — AB (ref 135–145)
TOTAL PROTEIN: 7.4 g/dL (ref 6.5–8.1)

## 2015-11-08 LAB — CBC WITH DIFFERENTIAL/PLATELET
BASOS ABS: 0 10*3/uL (ref 0.0–0.1)
BASOS PCT: 0 %
EOS ABS: 0.2 10*3/uL (ref 0.0–0.7)
EOS PCT: 2 %
HCT: 34.5 % — ABNORMAL LOW (ref 36.0–46.0)
Hemoglobin: 12 g/dL (ref 12.0–15.0)
Lymphocytes Relative: 30 %
Lymphs Abs: 2.8 10*3/uL (ref 0.7–4.0)
MCH: 36.3 pg — ABNORMAL HIGH (ref 26.0–34.0)
MCHC: 34.8 g/dL (ref 30.0–36.0)
MCV: 104.2 fL — ABNORMAL HIGH (ref 78.0–100.0)
Monocytes Absolute: 0.5 10*3/uL (ref 0.1–1.0)
Monocytes Relative: 6 %
Neutro Abs: 5.9 10*3/uL (ref 1.7–7.7)
Neutrophils Relative %: 62 %
PLATELETS: 444 10*3/uL — AB (ref 150–400)
RBC: 3.31 MIL/uL — AB (ref 3.87–5.11)
RDW: 15.5 % (ref 11.5–15.5)
WBC: 9.5 10*3/uL (ref 4.0–10.5)

## 2015-11-08 MED ORDER — POTASSIUM CHLORIDE CRYS ER 20 MEQ PO TBCR: 40.0000 meq | EXTENDED_RELEASE_TABLET | Freq: Every day | ORAL | 0 refills | Status: DC

## 2015-11-08 NOTE — Addendum Note (Signed)
Addended by: Michae Kava on: 11/08/2015 10:54 AM   Modules accepted: Orders

## 2015-11-08 NOTE — Patient Instructions (Signed)
Medication Instructions:  1. Your physician recommends that you continue on your current medications as directed. Please refer to the Current Medication list given to you today.   Labwork: NONE  Testing/Procedures: Your physician has requested that you have a lexiscan myoview. For further information please visit [email or web address]. Please follow instruction sheet, as given. PT HAVING SURGERY IN 8 DAYS   Follow-Up: Your physician wants you to follow-up in: [place] will receive a reminder letter in the mail two months in advance. If you don't receive a letter, please call our office to schedule the follow-up appointment.   Any Other Special Instructions Will Be Listed Below (If Applicable).     If you need a refill on your cardiac medications before your next appointment, please call your pharmacy.

## 2015-11-08 NOTE — Telephone Encounter (Signed)
Patient given detailed instructions per Myocardial Perfusion Study Information Sheet for the test on 11/09/15 at 0745. Patient notified to arrive 15 minutes early and that it is imperative to arrive on time for appointment to keep from having the test rescheduled.  If you need to cancel or reschedule your appointment, please call the office within 24 hours of your appointment. Failure to do so may result in a cancellation of your appointment, and a $50 no show fee. Patient verbalized understanding.E.Kubak,CNMT

## 2015-11-08 NOTE — Progress Notes (Signed)
Chief Complaint  Patient presents with  . Back Pain     History of Present Illness: 65 yo female with history of HTN, HLD, tobacco abuse, GERD, breast cancer who is here today as a new patient for cardiac evaluation. She has no known heart disease. She had a cardiac cath in 1997 and reports no CAD. She was told she may have coronary spasm. She has no recent stress testing. She has seen Dr. Tamala Julian remotely at [facility], last visit over 3 years ago. She has an ascending aortic aneurysm followed in primary care. Last CTA in Sept 2016 with 4.2 cm ascending aorta. She has smoked 1/2 ppd for 45 years.   She is having a lower back surgery pending next week. She has bulging discs. She has had no chest pain, dyspnea, near syncope or syncope. She is mostly limited by back pain.    Primary Care Physician: [name], NP   Past Medical History:  Diagnosis Date  . Ascending aortic aneurysm ([place])   . Cancer [facility]) 1995   breast, left, mastectomy/chemo  . Chest pain    Possibly cardiac. No evidence of ischemia/injury based upon normal troponin I. Chest discomfort could be tachycardia induced supply demand mismatch.   . Colon adenomas   . Coronary artery disease   . Essential hypertension, benign   . GERD (gastroesophageal reflux disease)   . Hyperlipidemia   . Hypertension   . Palpitations   . Pure hypercholesterolemia   . Thrombocytosis ([place])    Idiopathic    Past Surgical History:  Procedure Laterality Date  . ANTERIOR AND POSTERIOR REPAIR N/A 12/09/2014   Procedure: ANTERIOR (CYSTOCELE) AND POSTERIOR REPAIR (RECTOCELE);  Surgeon: Bjorn Loser, MD;  Location: [place] [facility];  Service: Urology;  Laterality: N/A;  . APPENDECTOMY    . BONE MARROW ASPIRATION  07/2012  . BONE MARROW BIOPSY  07/2012  . BREAST SURGERY    . CARDIAC CATHETERIZATION    . COLONOSCOPY  11/29/2010   Procedure: COLONOSCOPY;  Surgeon: Rogene Houston, MD;  [facility];  Service: Endoscopy;   Laterality: N/A;  . COLONOSCOPY N/A 02/18/2014   Procedure: COLONOSCOPY;  Surgeon: Rogene Houston, MD;  [facility];  Service: Endoscopy;  Laterality: N/A;  1030  . CYSTOSCOPY N/A 12/09/2014   Procedure: CYSTOSCOPY;  Surgeon: Bjorn Loser, MD;  Location: [place] [facility];  Service: Urology;  Laterality: N/A;  . MASTECTOMY     left  . OVARIAN CYST SURGERY     x2  . SALPINGOOPHORECTOMY Bilateral 12/09/2014   Procedure: SALPINGO OOPHORECTOMY;  Surgeon: Servando Salina, MD;  Location: [place] [facility];  Service: Gynecology;  Laterality: Bilateral;  . TUBAL LIGATION    . VAGINAL HYSTERECTOMY N/A 12/09/2014   Procedure: HYSTERECTOMY VAGINAL;  Surgeon: Servando Salina, MD;  Location: [place] [facility];  Service: Gynecology;  Laterality: N/A;    Current Outpatient Prescriptions  Medication Sig Dispense Refill  . aspirin 81 MG tablet Take 81 mg by mouth daily.      . Calcium Carb-Cholecalciferol (CALCIUM 600 + D) 600-200 MG-UNIT TABS Take 1 tablet by mouth 2 (two) times daily with a meal.    . cholecalciferol (VITAMIN D) 1000 UNITS tablet Take 1,000 Units by mouth daily.    Marland Kitchen diltiazem (CARDIZEM CD) 240 MG 24 hr capsule Take 240 mg by mouth daily.      . furosemide (LASIX) 40 MG tablet Take 40 mg by mouth daily.      Marland Kitchen gabapentin (NEURONTIN) 300  MG capsule Take 300 mg by mouth at bedtime.    . hydroxyurea (HYDREA) 500 MG capsule 500 mg PO Mon and Tues and 1000 mg PO Wed- Sun 50 capsule 0  . ibandronate (BONIVA) 150 MG tablet Take 1 tablet by mouth every 30 (thirty) days.    Marland Kitchen ibuprofen (ADVIL,MOTRIN) 800 MG tablet Take 1 tablet (800 mg total) by mouth every 8 (eight) hours as needed (mild pain). 30 tablet 0  . irbesartan-hydrochlorothiazide (AVALIDE) 150-12.5 MG per tablet Take 1 tablet by mouth daily.      . isosorbide mononitrate (IMDUR) 60 MG 24 hr tablet Take 60 mg by mouth daily.      . rosuvastatin (CRESTOR) 40 MG tablet Take 40 mg by mouth daily.      . traMADol (ULTRAM) 50 MG tablet Take 50 mg by  mouth every 6 (six) hours as needed (for pain).     No current facility-administered medications for this visit.     Allergies  Allergen Reactions  . Penicillins Hives and Rash    Social History   Social History  . Marital status: Divorced    Spouse name: N/A  . Number of children: 2  . Years of education: N/A   Occupational History  . Not on file.   Social History Main Topics  . Smoking status: Current Every Day Smoker    Packs/day: 0.50    Years: 40.00    Types: Cigarettes  . Smokeless tobacco: Never Used  . Alcohol use No  . Drug use: No  . Sexual activity: Yes    Birth control/ protection: Post-menopausal   Other Topics Concern  . Not on file   Social History Narrative  . No narrative on file    Family History  Problem Relation Age of Onset  . Hypertension Mother   . Heart failure Mother   . Congestive Heart Failure Mother   . COPD Mother   . Pernicious anemia Mother   . Cancer Mother     lung  . Hypertension Father   . CAD Father   . Heart attack Father   . Hypertension Sister   . Cancer Other   . Celiac disease Other     Review of Systems:  As stated in the HPI and otherwise negative.   BP 100/62 (BP Location: Right Arm, Patient Position: Sitting, Cuff Size: Normal)   Pulse 74   Ht '5\' 3"'  (1.6 m)   Wt 137 lb 6.4 oz (62.3 kg)   SpO2 96%   BMI 24.34 kg/m   Physical Examination: General: Well developed, well nourished, NAD  HEENT: OP clear, mucus membranes moist  SKIN: warm, dry. No rashes. Neuro: No focal deficits  Musculoskeletal: Muscle strength 5/5 all ext  Psychiatric: Mood and affect normal  Neck: No JVD, no carotid bruits, no thyromegaly, no lymphadenopathy.  Lungs:Clear bilaterally, no wheezes, rhonci, crackles Cardiovascular: Regular rate and rhythm. No murmurs, gallops or rubs. Abdomen:Soft. Bowel sounds present. Non-tender.  Extremities: No lower extremity edema. Pulses are 2 + in the bilateral DP/PT.  EKG:  EKG is ordered  today. The ekg ordered today demonstrates sinus, rate 75 bpm. PVC  Recent Labs: 12/10/2014: BUN 17; Creatinine, Ser 0.99; Hemoglobin 11.0; Platelets 372; Potassium 3.4; Sodium 134   Lipid Panel No results found for: CHOL, TRIG, HDL, CHOLHDL, VLDL, LDLCALC, LDLDIRECT   Wt Readings from Last 3 Encounters:  11/08/15 137 lb 6.4 oz (62.3 kg)  12/09/14 143 lb (64.9 kg)  12/07/14 142 lb 4  oz (64.5 kg)     Other studies Reviewed: Additional studies/ records that were reviewed today include: . Review of the above records demonstrates:     Assessment and Plan:   1. Pre-operative cardiovascular examination: She is having no chest pain or dyspnea. She does not have known CAD. She does have risk factors for CAD including age, tobacco abuse, HTN, HLD and FH of CAD. I will arrange a Lexiscan nuclear stress test to exclude ischemia.   2. HTN: BP controlled  3. Tobacco abuse: Cessation advised.   Current medicines are reviewed at length with the patient today.  The patient does not have concerns regarding medicines.  The following changes have been made:  no change  Labs/ tests ordered today include:  No orders of the defined types were placed in this encounter.    Disposition:   FU with me in 12  months   Signed, Lauree Chandler, MD 11/08/2015 8:52 AM    [facility] [place], [place], [place]  [address] Phone: [phone]; Fax: [phone]

## 2015-11-09 ENCOUNTER — Ambulatory Visit (HOSPITAL_COMMUNITY): Payer: Medicare Other | Attending: Cardiovascular Disease

## 2015-11-09 DIAGNOSIS — I10 Essential (primary) hypertension: Secondary | ICD-10-CM | POA: Insufficient documentation

## 2015-11-09 DIAGNOSIS — Z01810 Encounter for preprocedural cardiovascular examination: Secondary | ICD-10-CM | POA: Diagnosis not present

## 2015-11-09 LAB — MYOCARDIAL PERFUSION IMAGING
CHL CUP STRESS STAGE 1 DBP: 64 mmHg
CHL CUP STRESS STAGE 2 SPEED: 0 mph
CHL CUP STRESS STAGE 3 GRADE: 0 %
CHL CUP STRESS STAGE 3 SPEED: 0 mph
CHL CUP STRESS STAGE 4 GRADE: 0 %
CHL CUP STRESS STAGE 4 HR: 98 {beats}/min
CHL CUP STRESS STAGE 4 SPEED: 0 mph
CHL CUP STRESS STAGE 5 DBP: 59 mmHg
CHL CUP STRESS STAGE 5 GRADE: 0 %
CHL CUP STRESS STAGE 6 GRADE: 0 %
CHL CUP STRESS STAGE 6 SPEED: 0 mph
CSEPEW: 1 METS
LVDIAVOL: 87 mL (ref 46–106)
LVSYSVOL: 32 mL
NUC STRESS TID: 0.85
Peak HR: 98 {beats}/min
Percent of predicted max HR: 64 %
RATE: 0.41
Rest HR: 73 {beats}/min
SDS: 4
SRS: 0
SSS: 4
Stage 1 Grade: 0 %
Stage 1 HR: 75 {beats}/min
Stage 1 SBP: 102 mmHg
Stage 1 Speed: 0 mph
Stage 2 Grade: 0 %
Stage 2 HR: 75 {beats}/min
Stage 3 HR: 93 {beats}/min
Stage 5 HR: 99 {beats}/min
Stage 5 SBP: 107 mmHg
Stage 5 Speed: 0 mph
Stage 6 DBP: 58 mmHg
Stage 6 HR: 91 {beats}/min
Stage 6 SBP: 87 mmHg

## 2015-11-09 MED ORDER — REGADENOSON 0.4 MG/5ML IV SOLN
0.4000 mg | Freq: Once | INTRAVENOUS | Status: AC
  Administered 2015-11-09: 0.4 mg via INTRAVENOUS

## 2015-11-09 MED ORDER — TECHNETIUM TC 99M TETROFOSMIN IV KIT
31.6000 | PACK | Freq: Once | INTRAVENOUS | Status: AC | PRN
Start: 2015-11-09 — End: 2015-11-09
  Administered 2015-11-09: 32 via INTRAVENOUS
  Filled 2015-11-09: qty 32

## 2015-11-09 MED ORDER — TECHNETIUM TC 99M TETROFOSMIN IV KIT
10.6000 | PACK | Freq: Once | INTRAVENOUS | Status: AC | PRN
  Administered 2015-11-09: 11 via INTRAVENOUS
  Filled 2015-11-09: qty 11

## 2015-11-10 ENCOUNTER — Encounter: Payer: Self-pay | Admitting: Cardiovascular Disease

## 2015-11-10 ENCOUNTER — Telehealth: Payer: Self-pay | Admitting: *Deleted

## 2015-11-10 NOTE — Telephone Encounter (Signed)
Surgical clearance letter faxed to Dr.Dumonski at [facility].  Attention: Angela Nevin

## 2015-11-14 ENCOUNTER — Encounter (HOSPITAL_COMMUNITY)
Admission: RE | Admit: 2015-11-14 | Discharge: 2015-11-14 | Disposition: A | Discharge status: Home / Self Care | Payer: Medicare Other | Source: Ambulatory Visit | Attending: Orthopedic Surgery | Admitting: Orthopedic Surgery

## 2015-11-14 ENCOUNTER — Encounter (HOSPITAL_COMMUNITY): Payer: Self-pay

## 2015-11-14 ENCOUNTER — Ambulatory Visit (HOSPITAL_COMMUNITY)
Admission: RE | Admit: 2015-11-14 | Discharge: 2015-11-14 | Disposition: A | Discharge status: Home / Self Care | Payer: Medicare Other | Source: Ambulatory Visit | Attending: Orthopedic Surgery | Admitting: Orthopedic Surgery

## 2015-11-14 DIAGNOSIS — Z01818 Encounter for other preprocedural examination: Secondary | ICD-10-CM

## 2015-11-14 DIAGNOSIS — E785 Hyperlipidemia, unspecified: Secondary | ICD-10-CM | POA: Insufficient documentation

## 2015-11-14 DIAGNOSIS — J449 Chronic obstructive pulmonary disease, unspecified: Secondary | ICD-10-CM

## 2015-11-14 DIAGNOSIS — Z01812 Encounter for preprocedural laboratory examination: Secondary | ICD-10-CM

## 2015-11-14 DIAGNOSIS — Z9221 Personal history of antineoplastic chemotherapy: Secondary | ICD-10-CM | POA: Insufficient documentation

## 2015-11-14 DIAGNOSIS — I10 Essential (primary) hypertension: Secondary | ICD-10-CM

## 2015-11-14 DIAGNOSIS — I712 Thoracic aortic aneurysm, without rupture: Secondary | ICD-10-CM

## 2015-11-14 DIAGNOSIS — Z7983 Long term (current) use of bisphosphonates: Secondary | ICD-10-CM | POA: Insufficient documentation

## 2015-11-14 DIAGNOSIS — D473 Essential (hemorrhagic) thrombocythemia: Secondary | ICD-10-CM | POA: Insufficient documentation

## 2015-11-14 DIAGNOSIS — Z853 Personal history of malignant neoplasm of breast: Secondary | ICD-10-CM

## 2015-11-14 DIAGNOSIS — Z7982 Long term (current) use of aspirin: Secondary | ICD-10-CM | POA: Insufficient documentation

## 2015-11-14 DIAGNOSIS — I2510 Atherosclerotic heart disease of native coronary artery without angina pectoris: Secondary | ICD-10-CM | POA: Insufficient documentation

## 2015-11-14 DIAGNOSIS — Z0183 Encounter for blood typing: Secondary | ICD-10-CM

## 2015-11-14 DIAGNOSIS — Z79899 Other long term (current) drug therapy: Secondary | ICD-10-CM

## 2015-11-14 DIAGNOSIS — Z9012 Acquired absence of left breast and nipple: Secondary | ICD-10-CM

## 2015-11-14 DIAGNOSIS — K219 Gastro-esophageal reflux disease without esophagitis: Secondary | ICD-10-CM | POA: Insufficient documentation

## 2015-11-14 HISTORY — DX: Unspecified osteoarthritis, unspecified site: M19.90

## 2015-11-14 LAB — URINALYSIS, ROUTINE W REFLEX MICROSCOPIC
BILIRUBIN URINE: NEGATIVE
Glucose, UA: NEGATIVE mg/dL
HGB URINE DIPSTICK: NEGATIVE
Ketones, ur: NEGATIVE mg/dL
Leukocytes, UA: NEGATIVE
Nitrite: NEGATIVE
PROTEIN: NEGATIVE mg/dL
Specific Gravity, Urine: 1.013 (ref 1.005–1.030)
pH: 5 (ref 5.0–8.0)

## 2015-11-14 LAB — CBC WITH DIFFERENTIAL/PLATELET
Basophils Absolute: 0 10*3/uL (ref 0.0–0.1)
Basophils Relative: 0 %
EOS ABS: 0.2 10*3/uL (ref 0.0–0.7)
Eosinophils Relative: 2 %
HCT: 37.7 % (ref 36.0–46.0)
HEMOGLOBIN: 12.4 g/dL (ref 12.0–15.0)
LYMPHS ABS: 2.3 10*3/uL (ref 0.7–4.0)
Lymphocytes Relative: 22 %
MCH: 35.3 pg — AB (ref 26.0–34.0)
MCHC: 32.9 g/dL (ref 30.0–36.0)
MCV: 107.4 fL — ABNORMAL HIGH (ref 78.0–100.0)
MONOS PCT: 5 %
Monocytes Absolute: 0.5 10*3/uL (ref 0.1–1.0)
NEUTROS PCT: 71 %
Neutro Abs: 7.3 10*3/uL (ref 1.7–7.7)
Platelets: 420 10*3/uL — ABNORMAL HIGH (ref 150–400)
RBC: 3.51 MIL/uL — ABNORMAL LOW (ref 3.87–5.11)
RDW: 15.6 % — ABNORMAL HIGH (ref 11.5–15.5)
WBC: 10.3 10*3/uL (ref 4.0–10.5)

## 2015-11-14 LAB — COMPREHENSIVE METABOLIC PANEL
ALK PHOS: 65 U/L (ref 38–126)
ALT: 15 U/L (ref 14–54)
ANION GAP: 7 (ref 5–15)
AST: 22 U/L (ref 15–41)
Albumin: 3.9 g/dL (ref 3.5–5.0)
BILIRUBIN TOTAL: 0.4 mg/dL (ref 0.3–1.2)
BUN: 13 mg/dL (ref 6–20)
CALCIUM: 9.6 mg/dL (ref 8.9–10.3)
CO2: 27 mmol/L (ref 22–32)
Chloride: 105 mmol/L (ref 101–111)
Creatinine, Ser: 0.84 mg/dL (ref 0.44–1.00)
Glucose, Bld: 115 mg/dL — ABNORMAL HIGH (ref 65–99)
Potassium: 4 mmol/L (ref 3.5–5.1)
SODIUM: 139 mmol/L (ref 135–145)
TOTAL PROTEIN: 7.3 g/dL (ref 6.5–8.1)

## 2015-11-14 LAB — SURGICAL PCR SCREEN
MRSA, PCR: NEGATIVE
Staphylococcus aureus: NEGATIVE

## 2015-11-14 LAB — PROTIME-INR
INR: 1.09
PROTHROMBIN TIME: 14.2 s (ref 11.4–15.2)

## 2015-11-14 LAB — TYPE AND SCREEN
ABO/RH(D): AB POS
Antibody Screen: NEGATIVE

## 2015-11-14 LAB — APTT: aPTT: 33 seconds (ref 24–36)

## 2015-11-14 LAB — ABO/RH: ABO/RH(D): AB POS

## 2015-11-14 NOTE — H&P (Signed)
PREOPERATIVE H&P  Chief Complaint: left leg pain  HPI: Ashley [name] is a 65 y.o. female who presents with ongoing pain in the left leg x many years  MRI reveals spinal stenosis spanning L2-L5.  Also, the patient's radiographs do reveal a lumbar degenerative scoliosis, mainly involving L2-L5.  Patient has failed multiple forms of conservative care and continues to have pain (see office notes for additional details regarding the patient's full course of treatment)  Past Medical History:  Diagnosis Date  . Ascending aortic aneurysm ([place])   . Cancer [facility]) 1995   breast, left, mastectomy/chemo  . Chest pain    Possibly cardiac. No evidence of ischemia/injury based upon normal troponin I. Chest discomfort could be tachycardia induced supply demand mismatch.   . Colon adenomas   . Coronary artery disease   . Essential hypertension, benign   . GERD (gastroesophageal reflux disease)   . Hyperlipidemia   . Hypertension   . Palpitations   . Pure hypercholesterolemia   . Thrombocytosis ([place])    Idiopathic   Past Surgical History:  Procedure Laterality Date  . ANTERIOR AND POSTERIOR REPAIR N/A 12/09/2014   Procedure: ANTERIOR (CYSTOCELE) AND POSTERIOR REPAIR (RECTOCELE);  Surgeon: Bjorn Loser, MD;  Location: [place] [facility];  Service: Urology;  Laterality: N/A;  . APPENDECTOMY    . BONE MARROW ASPIRATION  07/2012  . BONE MARROW BIOPSY  07/2012  . BREAST SURGERY    . CARDIAC CATHETERIZATION    . COLONOSCOPY  11/29/2010   Procedure: COLONOSCOPY;  Surgeon: Rogene Houston, MD;  [facility];  Service: Endoscopy;  Laterality: N/A;  . COLONOSCOPY N/A 02/18/2014   Procedure: COLONOSCOPY;  Surgeon: Rogene Houston, MD;  [facility];  Service: Endoscopy;  Laterality: N/A;  1030  . CYSTOSCOPY N/A 12/09/2014   Procedure: CYSTOSCOPY;  Surgeon: Bjorn Loser, MD;  Location: [place] [facility];  Service: Urology;  Laterality: N/A;  . MASTECTOMY     left  . OVARIAN CYST SURGERY     x2    . SALPINGOOPHORECTOMY Bilateral 12/09/2014   Procedure: SALPINGO OOPHORECTOMY;  Surgeon: Servando Salina, MD;  Location: [facility] [facility];  Service: Gynecology;  Laterality: Bilateral;  . TUBAL LIGATION    . VAGINAL HYSTERECTOMY N/A 12/09/2014   Procedure: HYSTERECTOMY VAGINAL;  Surgeon: Servando Salina, MD;  Location: [facility];  Service: Gynecology;  Laterality: N/A;   Social History   Social History  . Marital status: Divorced    Spouse name: N/A  . Number of children: 2  . Years of education: N/A   Social History Main Topics  . Smoking status: Current Every Day Smoker    Packs/day: 0.50    Years: 40.00    Types: Cigarettes  . Smokeless tobacco: Never Used  . Alcohol use No  . Drug use: No  . Sexual activity: Yes    Birth control/ protection: Post-menopausal   Other Topics Concern  . Not on file   Social History Narrative  . No narrative on file   Family History  Problem Relation Age of Onset  . Hypertension Mother   . Heart failure Mother   . Congestive Heart Failure Mother   . COPD Mother   . Pernicious anemia Mother   . Cancer Mother     lung  . Hypertension Father   . CAD Father   . Heart attack Father   . Hypertension Sister   . Cancer Other   . Celiac disease Other  Allergies  Allergen Reactions  . Penicillins Hives and Rash    Has patient had a PCN reaction causing immediate rash, facial/tongue/throat swelling, SOB or lightheadedness with hypotension: Yes Has patient had a PCN reaction causing severe rash involving mucus membranes or skin necrosis: Yes Has patient had a PCN reaction that required hospitalization No Has patient had a PCN reaction occurring within the last 10 years: Yes If all of the above answers are "NO", then may proceed with Cephalosporin use.    Prior to Admission medications   Medication Sig Start Date End Date Taking? Authorizing Provider  aspirin 81 MG tablet Take 81 mg by mouth daily.      Historical Provider, MD  Calcium  Carb-Cholecalciferol (CALCIUM 600 + D) 600-200 MG-UNIT TABS Take 1 tablet by mouth 2 (two) times daily with a meal.    Historical Provider, MD  cholecalciferol (VITAMIN D) 1000 UNITS tablet Take 1,000 Units by mouth daily.    Historical Provider, MD  diltiazem (CARDIZEM CD) 240 MG 24 hr capsule Take 240 mg by mouth daily.      Historical Provider, MD  furosemide (LASIX) 40 MG tablet Take 40 mg by mouth daily.      Historical Provider, MD  gabapentin (NEURONTIN) 300 MG capsule Take 300 mg by mouth at bedtime.    Historical Provider, MD  hydroxyurea (HYDREA) 500 MG capsule 500 mg PO Mon and Tues and 1000 mg PO Wed- Sun 10/06/15   Baird Cancer, PA-C  ibandronate (BONIVA) 150 MG tablet Take 1 tablet by mouth every 30 (thirty) days. 07/14/12   Historical Provider, MD  ibuprofen (ADVIL,MOTRIN) 800 MG tablet Take 1 tablet (800 mg total) by mouth every 8 (eight) hours as needed (mild pain). 12/10/14   Servando Salina, MD  irbesartan-hydrochlorothiazide (AVALIDE) 150-12.5 MG per tablet Take 1 tablet by mouth daily.      Historical Provider, MD  isosorbide mononitrate (IMDUR) 60 MG 24 hr tablet Take 60 mg by mouth daily.      Historical Provider, MD  omeprazole (PRILOSEC OTC) 20 MG tablet Take 20 mg by mouth daily.    Historical Provider, MD  potassium chloride SA (K-DUR,KLOR-CON) 20 MEQ tablet Take 2 tablets (40 mEq total) by mouth daily. 11/08/15   Baird Cancer, PA-C  rosuvastatin (CRESTOR) 40 MG tablet Take 40 mg by mouth daily.      Historical Provider, MD  traMADol (ULTRAM) 50 MG tablet Take 50 mg by mouth every 6 (six) hours as needed (for pain).    Historical Provider, MD     All other systems have been reviewed and were otherwise negative with the exception of those mentioned in the HPI and as above.  Physical Exam: There were no vitals filed for this visit.  General: Alert, no acute distress Cardiovascular: No pedal edema Respiratory: No cyanosis, no use of accessory musculature Skin:  No lesions in the area of chief complaint Neurologic: Sensation intact distally Psychiatric: Patient is competent for consent with normal mood and affect Lymphatic: No axillary or cervical lymphadenopathy  MUSCULOSKELETAL: + SLR on the left  Assessment/Plan: Left leg pain,   Plan for Procedure(s): LEFT SIDED LATERAL INTERBODY FUSION, LUMBAR 2-3, LUMBAR 3-4, LUMBAR 4-5 WITH INSTRUMENTATION   Sinclair Ship, MD 11/14/2015 10:16 AM

## 2015-11-14 NOTE — Pre-Procedure Instructions (Signed)
[name]  11/14/2015      [name] - [place], [address] [name] [address] Phone: [phone] Fax: [phone]    Your procedure is scheduled on Wednesday, November 16, 2015  Report to [name] Admitting at 6:30 A.M.  Call this number if you have problems the morning of surgery:  [phone]   Remember:  Do not eat food or drink liquids after midnight Tuesday, November 15, 2015  Take these medicines the morning of surgery with A SIP OF WATER : Diltiazen ( Cardizem), Hydroxyurea   ( Hydrea), Isosorbide Mononitrate ( Imdur), Omeprazole ( Prilosec), if needed: Tramadol ( Ultram) for pain  Stop taking Aspirin, vitamins, fish oil and herbal medications. Do not take any NSAIDs ie: Ibuprofen, Advil, Naproxen, BC and Goody Powder or any medication containing Aspirin; stop now  Do not wear jewelry, make-up or nail polish.  Do not wear lotions, powders, or perfumes.  You may not wear deoderant.  Do not shave 48 hours prior to surgery.    Do not bring valuables to the hospital.  [name] is not responsible for any belongings or valuables.  Contacts, dentures or bridgework may not be worn into surgery.  Leave your suitcase in the car.  After surgery it may be brought to your room.  For patients admitted to the hospital, discharge time will be determined by your treatment team.  Patients discharged the day of surgery will not be allowed to drive home.   Name and phone number of your driver:   Special instructions:  [name] - Preparing for Surgery  Before surgery, you can play an important role.  Because skin is not sterile, your skin needs to be as free of germs as possible.  You can reduce the number of germs on you skin by washing with CHG (chlorahexidine gluconate) soap before surgery.  CHG is an antiseptic cleaner which kills germs and bonds with the skin to continue killing germs even after washing.  Please DO NOT use if  you have an allergy to CHG or antibacterial soaps.  If your skin becomes reddened/irritated stop using the CHG and inform your nurse when you arrive at Short Stay.  Do not shave (including legs and underarms) for at least 48 hours prior to the first CHG shower.  You may shave your face.  Please follow these instructions carefully:   1.  Shower with CHG Soap the night before surgery and the morning of Surgery.  2.  If you choose to wash your hair, wash your hair first as usual with your normal shampoo.  3.  After you shampoo, rinse your hair and body thoroughly to remove the Shampoo.  4.  Use CHG as you would any other liquid soap.  You can apply chg directly  to the skin and wash gently with scrungie or a clean washcloth.  5.  Apply the CHG Soap to your body ONLY FROM THE NECK DOWN.  Do not use on open wounds or open sores.  Avoid contact with your eyes, ears, mouth and genitals (private parts).  Wash genitals (private parts) with your normal soap.  6.  Wash thoroughly, paying special attention to the area where your surgery will be performed.  7.  Thoroughly rinse your body with warm water from the neck down.  8.  DO NOT shower/wash with your normal soap after using and rinsing off the CHG Soap.  9.  Pat yourself dry with a clean towel.            10.  Wear clean pajamas.            11.  Place clean sheets on your bed the night of your first shower and do not sleep with pets.  Day of Surgery  Do not apply any lotions/deodorants the morning of surgery.  Please wear clean clothes to the hospital/surgery [name].  Please read over the following fact sheets that you were given. Pain Booklet, Coughing and Deep Breathing, Blood Transfusion Information, MRSA Information and Surgical Site Infection Prevention

## 2015-11-14 NOTE — Progress Notes (Signed)
PCP: Delia Heady, PA [address] in [place], [place]  Cardiologist: Dr. Angelena Form.

## 2015-11-15 NOTE — Progress Notes (Signed)
Anesthesia Chart Review: Patient is a 65 year old female scheduled for left sided lateral interbody fusion L2-3, L3-4, L4-5 on 11/16/2015 by Dr. Lynann Bologna.  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  PCP is Angelina Ok, NP at [facility]. Hem-Onc is Dr. Tressie Stalker [facility]). She was seen by cardiologist Dr. Angelena Form on 11/08/15 for a preoperative evaluation. He cleared her following a normal stress test.  Meds include aspirin 81 mg, diltiazem, Lasix, Neurontin, hydroxyurea, Boniva, Avalide, Imdur, Prilosec, KCl, Crestor, tramadol.  11/08/15 EKG: SR with PACs.  11/09/15 Nuclear stress test: Myocardial perfusion is normal. This is a low risk study. Overall left ventricular systolic function was normal. LV cavity size is normal. Nuclear stress EF: 63%.   11/14/15 CXR: IMPRESSION: COPD with mild chronic interstitial prominence. No acute pneumonia, CHF, nor other acute cardiopulmonary abnormality.  01/10/15 CT Chest: IMPRESSION: - Stable sub-cm bilateral pulmonary nodules, consistent with benign etiology. Mild emphysema. No active lung disease. - Stable benign right adrenal adenoma. - Stable 4.2 cm ascending thoracic aortic aneurysm. Recommend annual imaging followup by CTA or MRA. This recommendation follows 2010 ACCF/AHA/AATS/ACR/ASA/SCA/SCAI/SIR/STS/SVM Guidelines for the Diagnosis and Management of Patients with Thoracic Aortic Disease. Circulation. 2010; 121: X614-J092  Preoperative labs noted. CMET WNL (non-fasting glucose 115). H/H 12.4/37.7, PLT 420K. PT/PTT, UA WNL. T&S done.  If no acute changes then I would anticipate that she can  proceed as planned.  George Hugh [facility] Short Stay Center/Anesthesiology Phone [phone] 11/15/2015 10:44 AM

## 2015-11-16 ENCOUNTER — Encounter (HOSPITAL_COMMUNITY)
Admission: RE | Disposition: A | Discharge status: Home Health | Payer: Self-pay | Source: Ambulatory Visit | Attending: Orthopedic Surgery

## 2015-11-16 ENCOUNTER — Inpatient Hospital Stay (HOSPITAL_COMMUNITY): Payer: Medicare Other | Admitting: Vascular Surgery

## 2015-11-16 ENCOUNTER — Inpatient Hospital Stay (HOSPITAL_COMMUNITY): Payer: Medicare Other | Admitting: Certified Registered Nurse Anesthetist

## 2015-11-16 ENCOUNTER — Inpatient Hospital Stay (HOSPITAL_COMMUNITY): Payer: Medicare Other

## 2015-11-16 ENCOUNTER — Inpatient Hospital Stay (HOSPITAL_COMMUNITY)
Admission: RE | Admit: 2015-11-16 | Discharge: 2015-11-21 | DRG: 456 | Disposition: A | Discharge status: Home Health | Payer: Medicare Other | Source: Ambulatory Visit | Attending: Orthopedic Surgery | Admitting: Orthopedic Surgery

## 2015-11-16 ENCOUNTER — Encounter (HOSPITAL_COMMUNITY): Payer: Self-pay | Admitting: *Deleted

## 2015-11-16 DIAGNOSIS — I959 Hypotension, unspecified: Secondary | ICD-10-CM | POA: Diagnosis not present

## 2015-11-16 DIAGNOSIS — M4186 Other forms of scoliosis, lumbar region: Principal | ICD-10-CM | POA: Diagnosis present

## 2015-11-16 DIAGNOSIS — J9621 Acute and chronic respiratory failure with hypoxia: Secondary | ICD-10-CM | POA: Diagnosis not present

## 2015-11-16 DIAGNOSIS — I110 Hypertensive heart disease with heart failure: Secondary | ICD-10-CM | POA: Diagnosis present

## 2015-11-16 DIAGNOSIS — I509 Heart failure, unspecified: Secondary | ICD-10-CM | POA: Diagnosis present

## 2015-11-16 DIAGNOSIS — Z853 Personal history of malignant neoplasm of breast: Secondary | ICD-10-CM

## 2015-11-16 DIAGNOSIS — I950 Idiopathic hypotension: Secondary | ICD-10-CM | POA: Diagnosis not present

## 2015-11-16 DIAGNOSIS — K219 Gastro-esophageal reflux disease without esophagitis: Secondary | ICD-10-CM | POA: Diagnosis present

## 2015-11-16 DIAGNOSIS — Z7982 Long term (current) use of aspirin: Secondary | ICD-10-CM

## 2015-11-16 DIAGNOSIS — I952 Hypotension due to drugs: Secondary | ICD-10-CM | POA: Diagnosis not present

## 2015-11-16 DIAGNOSIS — J9601 Acute respiratory failure with hypoxia: Secondary | ICD-10-CM | POA: Diagnosis not present

## 2015-11-16 DIAGNOSIS — E7800 Pure hypercholesterolemia, unspecified: Secondary | ICD-10-CM | POA: Diagnosis present

## 2015-11-16 DIAGNOSIS — E876 Hypokalemia: Secondary | ICD-10-CM | POA: Diagnosis not present

## 2015-11-16 DIAGNOSIS — Z419 Encounter for procedure for purposes other than remedying health state, unspecified: Secondary | ICD-10-CM

## 2015-11-16 DIAGNOSIS — Z79899 Other long term (current) drug therapy: Secondary | ICD-10-CM | POA: Diagnosis not present

## 2015-11-16 DIAGNOSIS — Z880 Allergy status to penicillin: Secondary | ICD-10-CM

## 2015-11-16 DIAGNOSIS — M4806 Spinal stenosis, lumbar region: Secondary | ICD-10-CM | POA: Diagnosis present

## 2015-11-16 DIAGNOSIS — Z825 Family history of asthma and other chronic lower respiratory diseases: Secondary | ICD-10-CM | POA: Diagnosis not present

## 2015-11-16 DIAGNOSIS — J690 Pneumonitis due to inhalation of food and vomit: Secondary | ICD-10-CM | POA: Diagnosis not present

## 2015-11-16 DIAGNOSIS — I2510 Atherosclerotic heart disease of native coronary artery without angina pectoris: Secondary | ICD-10-CM | POA: Diagnosis present

## 2015-11-16 DIAGNOSIS — J449 Chronic obstructive pulmonary disease, unspecified: Secondary | ICD-10-CM | POA: Diagnosis present

## 2015-11-16 DIAGNOSIS — J431 Panlobular emphysema: Secondary | ICD-10-CM

## 2015-11-16 DIAGNOSIS — E785 Hyperlipidemia, unspecified: Secondary | ICD-10-CM | POA: Diagnosis present

## 2015-11-16 DIAGNOSIS — J439 Emphysema, unspecified: Secondary | ICD-10-CM

## 2015-11-16 DIAGNOSIS — Z8249 Family history of ischemic heart disease and other diseases of the circulatory system: Secondary | ICD-10-CM

## 2015-11-16 DIAGNOSIS — R0902 Hypoxemia: Secondary | ICD-10-CM

## 2015-11-16 DIAGNOSIS — R0602 Shortness of breath: Secondary | ICD-10-CM

## 2015-11-16 DIAGNOSIS — M79605 Pain in left leg: Secondary | ICD-10-CM | POA: Diagnosis present

## 2015-11-16 DIAGNOSIS — J9600 Acute respiratory failure, unspecified whether with hypoxia or hypercapnia: Secondary | ICD-10-CM

## 2015-11-16 DIAGNOSIS — J441 Chronic obstructive pulmonary disease with (acute) exacerbation: Secondary | ICD-10-CM

## 2015-11-16 DIAGNOSIS — F17210 Nicotine dependence, cigarettes, uncomplicated: Secondary | ICD-10-CM | POA: Diagnosis present

## 2015-11-16 DIAGNOSIS — M5410 Radiculopathy, site unspecified: Secondary | ICD-10-CM | POA: Diagnosis present

## 2015-11-16 HISTORY — PX: ANTERIOR LAT LUMBAR FUSION: SHX1168

## 2015-11-16 SURGERY — ANTERIOR LATERAL LUMBAR FUSION 3 LEVELS
Anesthesia: General | Laterality: Left

## 2015-11-16 MED ORDER — IRBESARTAN 150 MG PO TABS
150.0000 mg | ORAL_TABLET | Freq: Every day | ORAL | Status: DC
  Filled 2015-11-16: qty 1

## 2015-11-16 MED ORDER — ROSUVASTATIN CALCIUM 40 MG PO TABS
40.0000 mg | ORAL_TABLET | Freq: Every day | ORAL | Status: DC
  Administered 2015-11-17 – 2015-11-20 (×4): 40 mg via ORAL
  Filled 2015-11-16 (×6): qty 1

## 2015-11-16 MED ORDER — SODIUM CHLORIDE 0.9% FLUSH: 3.0000 mL | INTRAVENOUS | Status: DC | PRN

## 2015-11-16 MED ORDER — DIAZEPAM 5 MG PO TABS
ORAL_TABLET | ORAL | Status: AC
  Filled 2015-11-16: qty 1

## 2015-11-16 MED ORDER — OXYCODONE-ACETAMINOPHEN 5-325 MG PO TABS
1.0000 | ORAL_TABLET | ORAL | Status: DC | PRN
  Administered 2015-11-16: 2 via ORAL
  Filled 2015-11-16: qty 2

## 2015-11-16 MED ORDER — VANCOMYCIN HCL IN DEXTROSE 1-5 GM/200ML-% IV SOLN: 1000.0000 mg | INTRAVENOUS | Status: DC

## 2015-11-16 MED ORDER — POTASSIUM CHLORIDE CRYS ER 20 MEQ PO TBCR
40.0000 meq | EXTENDED_RELEASE_TABLET | Freq: Every day | ORAL | Status: DC
  Administered 2015-11-18 – 2015-11-21 (×4): 40 meq via ORAL
  Filled 2015-11-16 (×4): qty 2

## 2015-11-16 MED ORDER — BUPIVACAINE-EPINEPHRINE (PF) 0.25% -1:200000 IJ SOLN
INTRAMUSCULAR | Status: AC
  Filled 2015-11-16: qty 30

## 2015-11-16 MED ORDER — HYDROXYUREA 500 MG PO CAPS
1000.0000 mg | ORAL_CAPSULE | ORAL | Status: DC
  Administered 2015-11-16 – 2015-11-20 (×5): 1000 mg via ORAL
  Filled 2015-11-16 (×5): qty 2

## 2015-11-16 MED ORDER — FUROSEMIDE 40 MG PO TABS: 40.0000 mg | ORAL_TABLET | Freq: Every day | ORAL | Status: DC

## 2015-11-16 MED ORDER — ALUM & MAG HYDROXIDE-SIMETH 200-200-20 MG/5ML PO SUSP: 30.0000 mL | Freq: Four times a day (QID) | ORAL | Status: DC | PRN

## 2015-11-16 MED ORDER — HYDROMORPHONE HCL 1 MG/ML IJ SOLN
INTRAMUSCULAR | Status: AC
  Filled 2015-11-16: qty 1

## 2015-11-16 MED ORDER — VANCOMYCIN HCL IN DEXTROSE 1-5 GM/200ML-% IV SOLN
1000.0000 mg | Freq: Once | INTRAVENOUS | Status: AC
Start: 2015-11-16 — End: 2015-11-16
  Administered 2015-11-16: 1000 mg via INTRAVENOUS
  Filled 2015-11-16: qty 200

## 2015-11-16 MED ORDER — ACETAMINOPHEN 325 MG PO TABS
650.0000 mg | ORAL_TABLET | ORAL | Status: DC | PRN
  Administered 2015-11-16: 650 mg via ORAL
  Filled 2015-11-16: qty 2

## 2015-11-16 MED ORDER — HYDROMORPHONE HCL 1 MG/ML IJ SOLN
0.2500 mg | INTRAMUSCULAR | Status: DC | PRN
  Administered 2015-11-16 (×4): 0.5 mg via INTRAVENOUS

## 2015-11-16 MED ORDER — THROMBIN 20000 UNITS EX KIT
PACK | CUTANEOUS | Status: DC | PRN
  Administered 2015-11-16: 20000 [IU] via TOPICAL

## 2015-11-16 MED ORDER — ONDANSETRON HCL 4 MG/2ML IJ SOLN
INTRAMUSCULAR | Status: AC
  Filled 2015-11-16: qty 2

## 2015-11-16 MED ORDER — ISOSORBIDE MONONITRATE ER 60 MG PO TB24
60.0000 mg | ORAL_TABLET | Freq: Every day | ORAL | Status: DC
  Filled 2015-11-16: qty 1

## 2015-11-16 MED ORDER — DEXAMETHASONE SODIUM PHOSPHATE 10 MG/ML IJ SOLN
INTRAMUSCULAR | Status: AC
  Filled 2015-11-16: qty 1

## 2015-11-16 MED ORDER — SODIUM CHLORIDE 0.9 % IV SOLN: 250.0000 mL | INTRAVENOUS | Status: DC

## 2015-11-16 MED ORDER — DIAZEPAM 5 MG PO TABS
5.0000 mg | ORAL_TABLET | Freq: Four times a day (QID) | ORAL | Status: DC | PRN
  Administered 2015-11-16: 5 mg via ORAL
  Filled 2015-11-16: qty 1

## 2015-11-16 MED ORDER — PHENYLEPHRINE 40 MCG/ML (10ML) SYRINGE FOR IV PUSH (FOR BLOOD PRESSURE SUPPORT)
PREFILLED_SYRINGE | INTRAVENOUS | Status: DC | PRN
  Administered 2015-11-16: 120 ug via INTRAVENOUS

## 2015-11-16 MED ORDER — FENTANYL CITRATE (PF) 250 MCG/5ML IJ SOLN
INTRAMUSCULAR | Status: AC
  Filled 2015-11-16: qty 5

## 2015-11-16 MED ORDER — ONDANSETRON HCL 4 MG/2ML IJ SOLN
4.0000 mg | INTRAMUSCULAR | Status: DC | PRN
  Filled 2015-11-16: qty 2

## 2015-11-16 MED ORDER — LIDOCAINE 2% (20 MG/ML) 5 ML SYRINGE
INTRAMUSCULAR | Status: AC
  Filled 2015-11-16: qty 5

## 2015-11-16 MED ORDER — PROPOFOL 10 MG/ML IV BOLUS
INTRAVENOUS | Status: DC | PRN
  Administered 2015-11-16: 150 mg via INTRAVENOUS

## 2015-11-16 MED ORDER — THROMBIN 20000 UNITS EX SOLR
CUTANEOUS | Status: AC
  Filled 2015-11-16: qty 20000

## 2015-11-16 MED ORDER — POVIDONE-IODINE 7.5 % EX SOLN
Freq: Once | CUTANEOUS | Status: DC
  Filled 2015-11-16: qty 118

## 2015-11-16 MED ORDER — MORPHINE SULFATE (PF) 2 MG/ML IV SOLN
1.0000 mg | INTRAVENOUS | Status: DC | PRN
  Administered 2015-11-16: 2 mg via INTRAVENOUS
  Filled 2015-11-16 (×2): qty 1

## 2015-11-16 MED ORDER — HYDROXYUREA 500 MG PO CAPS: 500.0000 mg | ORAL_CAPSULE | Freq: Every day | ORAL | Status: DC

## 2015-11-16 MED ORDER — EPHEDRINE SULFATE 50 MG/ML IJ SOLN
INTRAMUSCULAR | Status: DC | PRN
  Administered 2015-11-16: 10 mg via INTRAVENOUS

## 2015-11-16 MED ORDER — PROPOFOL 10 MG/ML IV BOLUS
INTRAVENOUS | Status: AC
  Filled 2015-11-16: qty 20

## 2015-11-16 MED ORDER — GLYCOPYRROLATE 0.2 MG/ML IV SOSY
PREFILLED_SYRINGE | INTRAVENOUS | Status: DC | PRN
Start: 2015-11-16 — End: 2015-11-16
  Administered 2015-11-16: .2 mg via INTRAVENOUS

## 2015-11-16 MED ORDER — ONDANSETRON HCL 4 MG/2ML IJ SOLN
INTRAMUSCULAR | Status: DC | PRN
  Administered 2015-11-16: 4 mg via INTRAVENOUS

## 2015-11-16 MED ORDER — LACTATED RINGERS IV SOLN
INTRAVENOUS | Status: DC | PRN
  Administered 2015-11-16 (×3): via INTRAVENOUS

## 2015-11-16 MED ORDER — FENTANYL CITRATE (PF) 100 MCG/2ML IJ SOLN
INTRAMUSCULAR | Status: DC | PRN
  Administered 2015-11-16: 100 ug via INTRAVENOUS
  Administered 2015-11-16: 75 ug via INTRAVENOUS
  Administered 2015-11-16 (×3): 50 ug via INTRAVENOUS
  Administered 2015-11-16: 75 ug via INTRAVENOUS
  Administered 2015-11-16: 50 ug via INTRAVENOUS

## 2015-11-16 MED ORDER — ALBUMIN HUMAN 5 % IV SOLN
INTRAVENOUS | Status: DC | PRN
  Administered 2015-11-16 (×2): via INTRAVENOUS

## 2015-11-16 MED ORDER — PHENYLEPHRINE HCL 10 MG/ML IJ SOLN
INTRAVENOUS | Status: DC | PRN
  Administered 2015-11-16: 25 ug/min via INTRAVENOUS

## 2015-11-16 MED ORDER — CALCIUM CARBONATE-VITAMIN D 500-200 MG-UNIT PO TABS
1.0000 | ORAL_TABLET | Freq: Two times a day (BID) | ORAL | Status: DC
  Administered 2015-11-17 – 2015-11-21 (×8): 1 via ORAL
  Filled 2015-11-16 (×10): qty 1

## 2015-11-16 MED ORDER — VITAMIN D 1000 UNITS PO TABS
1000.0000 [IU] | ORAL_TABLET | Freq: Every day | ORAL | Status: DC
Start: 2015-11-16 — End: 2015-11-21
  Administered 2015-11-18 – 2015-11-21 (×4): 1000 [IU] via ORAL
  Filled 2015-11-16 (×4): qty 1

## 2015-11-16 MED ORDER — ZOLPIDEM TARTRATE 5 MG PO TABS: 5.0000 mg | ORAL_TABLET | Freq: Every evening | ORAL | Status: DC | PRN

## 2015-11-16 MED ORDER — IRBESARTAN-HYDROCHLOROTHIAZIDE 150-12.5 MG PO TABS: 1.0000 | ORAL_TABLET | Freq: Every day | ORAL | Status: DC

## 2015-11-16 MED ORDER — MIDAZOLAM HCL 2 MG/2ML IJ SOLN
INTRAMUSCULAR | Status: AC
  Filled 2015-11-16: qty 2

## 2015-11-16 MED ORDER — FLEET ENEMA 7-19 GM/118ML RE ENEM: 1.0000 | ENEMA | Freq: Once | RECTAL | Status: DC | PRN

## 2015-11-16 MED ORDER — LIDOCAINE HCL (CARDIAC) 20 MG/ML IV SOLN
INTRAVENOUS | Status: DC | PRN
  Administered 2015-11-16: 80 mg via INTRAVENOUS

## 2015-11-16 MED ORDER — BUPIVACAINE-EPINEPHRINE 0.25% -1:200000 IJ SOLN
INTRAMUSCULAR | Status: DC | PRN
  Administered 2015-11-16: 5 mL

## 2015-11-16 MED ORDER — SUCCINYLCHOLINE CHLORIDE 200 MG/10ML IV SOSY
PREFILLED_SYRINGE | INTRAVENOUS | Status: DC | PRN
  Administered 2015-11-16: 100 mg via INTRAVENOUS

## 2015-11-16 MED ORDER — SENNOSIDES-DOCUSATE SODIUM 8.6-50 MG PO TABS: 1.0000 | ORAL_TABLET | Freq: Every evening | ORAL | Status: DC | PRN

## 2015-11-16 MED ORDER — DILTIAZEM HCL ER COATED BEADS 240 MG PO CP24
240.0000 mg | ORAL_CAPSULE | Freq: Every day | ORAL | Status: DC
  Administered 2015-11-16 – 2015-11-21 (×3): 240 mg via ORAL
  Filled 2015-11-16 (×4): qty 1

## 2015-11-16 MED ORDER — ACETAMINOPHEN 650 MG RE SUPP: 650.0000 mg | RECTAL | Status: DC | PRN

## 2015-11-16 MED ORDER — GABAPENTIN 300 MG PO CAPS
300.0000 mg | ORAL_CAPSULE | Freq: Every day | ORAL | Status: DC
  Administered 2015-11-16 – 2015-11-20 (×5): 300 mg via ORAL
  Filled 2015-11-16: qty 1
  Filled 2015-11-16: qty 3
  Filled 2015-11-16 (×4): qty 1

## 2015-11-16 MED ORDER — SODIUM CHLORIDE 0.9 % IV SOLN
INTRAVENOUS | Status: DC
Start: 2015-11-16 — End: 2015-11-17

## 2015-11-16 MED ORDER — OMEPRAZOLE MAGNESIUM 20 MG PO TBEC: 20.0000 mg | DELAYED_RELEASE_TABLET | Freq: Every day | ORAL | Status: DC

## 2015-11-16 MED ORDER — PHENOL 1.4 % MT LIQD: 1.0000 | OROMUCOSAL | Status: DC | PRN

## 2015-11-16 MED ORDER — CALCIUM CARB-CHOLECALCIFEROL 600-200 MG-UNIT PO TABS: 1.0000 | ORAL_TABLET | Freq: Two times a day (BID) | ORAL | Status: DC

## 2015-11-16 MED ORDER — 0.9 % SODIUM CHLORIDE (POUR BTL) OPTIME
TOPICAL | Status: DC | PRN
  Administered 2015-11-16 (×2): 1000 mL

## 2015-11-16 MED ORDER — ROCURONIUM BROMIDE 50 MG/5ML IV SOLN
INTRAVENOUS | Status: AC
  Filled 2015-11-16: qty 1

## 2015-11-16 MED ORDER — BISACODYL 5 MG PO TBEC: 5.0000 mg | DELAYED_RELEASE_TABLET | Freq: Every day | ORAL | Status: DC | PRN

## 2015-11-16 MED ORDER — VANCOMYCIN HCL IN DEXTROSE 1-5 GM/200ML-% IV SOLN
1000.0000 mg | INTRAVENOUS | Status: AC
  Administered 2015-11-16: 1000 mg via INTRAVENOUS
  Filled 2015-11-16: qty 200

## 2015-11-16 MED ORDER — HYDROCHLOROTHIAZIDE 12.5 MG PO CAPS
12.5000 mg | ORAL_CAPSULE | Freq: Every day | ORAL | Status: DC
  Filled 2015-11-16: qty 1

## 2015-11-16 MED ORDER — IBANDRONATE SODIUM 150 MG PO TABS: 150.0000 mg | ORAL_TABLET | ORAL | Status: DC

## 2015-11-16 MED ORDER — SODIUM CHLORIDE 0.9% FLUSH: 3.0000 mL | Freq: Two times a day (BID) | INTRAVENOUS | Status: DC

## 2015-11-16 MED ORDER — PANTOPRAZOLE SODIUM 40 MG PO TBEC
40.0000 mg | DELAYED_RELEASE_TABLET | Freq: Every day | ORAL | Status: DC
  Administered 2015-11-18 – 2015-11-21 (×4): 40 mg via ORAL
  Filled 2015-11-16 (×4): qty 1

## 2015-11-16 MED ORDER — HYDROXYUREA 500 MG PO CAPS: 500.0000 mg | ORAL_CAPSULE | ORAL | Status: DC

## 2015-11-16 MED ORDER — DOCUSATE SODIUM 100 MG PO CAPS
100.0000 mg | ORAL_CAPSULE | Freq: Two times a day (BID) | ORAL | Status: DC
  Administered 2015-11-16: 100 mg via ORAL
  Filled 2015-11-16: qty 1

## 2015-11-16 MED ORDER — MENTHOL 3 MG MT LOZG: 1.0000 | LOZENGE | OROMUCOSAL | Status: DC | PRN

## 2015-11-16 SURGICAL SUPPLY — 73 items
BENZOIN TINCTURE PRP APPL 2/3 (GAUZE/BANDAGES/DRESSINGS) ×3 IMPLANT
BLADE SURG 10 STRL SS (BLADE) ×3 IMPLANT
BLADE SURG ROTATE 9660 (MISCELLANEOUS) IMPLANT
CAGE COROENT XL PLUS 8X18X55 (Cage) ×3 IMPLANT
CLOSURE WOUND 1/2 X4 (GAUZE/BANDAGES/DRESSINGS) ×1
CORDS BIPOLAR (ELECTRODE) ×3 IMPLANT
COVER SURGICAL LIGHT HANDLE (MISCELLANEOUS) ×3 IMPLANT
DRAPE C-ARM 42X72 X-RAY (DRAPES) ×6 IMPLANT
DRAPE C-ARMOR (DRAPES) ×3 IMPLANT
DRAPE POUCH INSTRU U-SHP 10X18 (DRAPES) ×3 IMPLANT
DRAPE SURG 17X23 STRL (DRAPES) ×3 IMPLANT
DRAPE U-SHAPE 47X51 STRL (DRAPES) ×3 IMPLANT
DRSG MEPILEX BORDER 4X8 (GAUZE/BANDAGES/DRESSINGS) ×3 IMPLANT
DURAPREP 26ML APPLICATOR (WOUND CARE) ×3 IMPLANT
ELECT BLADE 4.0 EZ CLEAN MEGAD (MISCELLANEOUS) ×3
ELECT CAUTERY BLADE 6.4 (BLADE) ×3 IMPLANT
ELECT REM PT RETURN 9FT ADLT (ELECTROSURGICAL) ×3
ELECTRODE BLDE 4.0 EZ CLN MEGD (MISCELLANEOUS) ×1 IMPLANT
ELECTRODE REM PT RTRN 9FT ADLT (ELECTROSURGICAL) ×1 IMPLANT
FEE INTRAOP MONITOR IMPULS NCS (MISCELLANEOUS) ×1 IMPLANT
GAUZE SPONGE 4X4 16PLY XRAY LF (GAUZE/BANDAGES/DRESSINGS) ×6 IMPLANT
GLOVE BIO SURGEON STRL SZ7 (GLOVE) ×6 IMPLANT
GLOVE BIO SURGEON STRL SZ8 (GLOVE) ×3 IMPLANT
GLOVE BIOGEL PI IND STRL 7.0 (GLOVE) ×2 IMPLANT
GLOVE BIOGEL PI IND STRL 8 (GLOVE) ×1 IMPLANT
GLOVE BIOGEL PI INDICATOR 7.0 (GLOVE) ×4
GLOVE BIOGEL PI INDICATOR 8 (GLOVE) ×2
GOWN STRL REUS W/ TWL LRG LVL3 (GOWN DISPOSABLE) ×3 IMPLANT
GOWN STRL REUS W/TWL LRG LVL3 (GOWN DISPOSABLE) ×6
IMPL COROENT XL 10X18X55 (Intraocular Lens) ×1 IMPLANT
IMPL COROENT XL 12X18X55MM ×1 IMPLANT
IMPLANT COROENT XL 10X18X55 (Intraocular Lens) ×3 IMPLANT
IMPLANT COROENT XL 12X18X55MM ×3 IMPLANT
INTRAOP MONITOR FEE IMPULS NCS (MISCELLANEOUS) ×1
INTRAOP MONITOR FEE IMPULSE (MISCELLANEOUS) ×2
KIT BASIN OR (CUSTOM PROCEDURE TRAY) ×3 IMPLANT
KIT DILATOR XLIF 5 (KITS) ×1 IMPLANT
KIT ROOM TURNOVER OR (KITS) ×3 IMPLANT
KIT SURGICAL ACCESS MAXCESS 4 (KITS) ×3 IMPLANT
KIT XLIF (KITS) ×2
MARKER SKIN DUAL TIP RULER LAB (MISCELLANEOUS) ×3 IMPLANT
MIX DBX 20CC MTF (Putty) ×3 IMPLANT
MODULE EMG NEEDLE SSEP NVM5 (NEEDLE) ×3 IMPLANT
MODULE NVM5 NEXT GEN EMG (NEEDLE) ×3 IMPLANT
NEEDLE HYPO 25GX1X1/2 BEV (NEEDLE) ×3 IMPLANT
NS IRRIG 1000ML POUR BTL (IV SOLUTION) ×3 IMPLANT
PACK LAMINECTOMY ORTHO (CUSTOM PROCEDURE TRAY) ×3 IMPLANT
PACK UNIVERSAL I (CUSTOM PROCEDURE TRAY) ×3 IMPLANT
PAD ARMBOARD 7.5X6 YLW CONV (MISCELLANEOUS) ×6 IMPLANT
PUTTY BONE DBX 2.5 MIS (Bone Implant) ×3 IMPLANT
SPONGE GAUZE 4X4 12PLY STER LF (GAUZE/BANDAGES/DRESSINGS) ×3 IMPLANT
SPONGE INTESTINAL PEANUT (DISPOSABLE) ×12 IMPLANT
SPONGE LAP 4X18 X RAY DECT (DISPOSABLE) ×3 IMPLANT
SPONGE SURGIFOAM ABS GEL 100 (HEMOSTASIS) IMPLANT
STAPLER VISISTAT 35W (STAPLE) ×3 IMPLANT
STRIP CLOSURE SKIN 1/2X4 (GAUZE/BANDAGES/DRESSINGS) ×2 IMPLANT
SURFACE SSEP/EMG (MISCELLANEOUS) ×3 IMPLANT
SUT MNCRL AB 4-0 PS2 18 (SUTURE) ×3 IMPLANT
SUT PROLENE 5 0 C 1 24 (SUTURE) IMPLANT
SUT VIC AB 0 CT1 18XCR BRD 8 (SUTURE) ×1 IMPLANT
SUT VIC AB 0 CT1 8-18 (SUTURE) ×2
SUT VIC AB 1 CT1 27 (SUTURE) ×4
SUT VIC AB 1 CT1 27XBRD ANBCTR (SUTURE) ×2 IMPLANT
SUT VIC AB 2-0 CT2 18 VCP726D (SUTURE) ×6 IMPLANT
SYR BULB IRRIGATION 50ML (SYRINGE) ×3 IMPLANT
SYR CONTROL 10ML LL (SYRINGE) ×3 IMPLANT
TAPE CLOTH SURG 4X10 WHT LF (GAUZE/BANDAGES/DRESSINGS) ×3 IMPLANT
TAPE CLOTH SURG 6X10 WHT LF (GAUZE/BANDAGES/DRESSINGS) ×6 IMPLANT
TOWEL OR 17X24 6PK STRL BLUE (TOWEL DISPOSABLE) ×3 IMPLANT
TOWEL OR 17X26 10 PK STRL BLUE (TOWEL DISPOSABLE) ×6 IMPLANT
TRAY FOLEY CATH 16FR SILVER (SET/KITS/TRAYS/PACK) ×3 IMPLANT
WATER STERILE IRR 1000ML POUR (IV SOLUTION) IMPLANT
YANKAUER SUCT BULB TIP NO VENT (SUCTIONS) ×3 IMPLANT

## 2015-11-16 NOTE — Transfer of Care (Signed)
Immediate Anesthesia Transfer of Care Note  Patient: ANNELL CANTY  Procedure(s) Performed: Procedure(s) with comments: LEFT SIDED LATERAL INTERBODY FUSION, LUMBAR 2-3, LUMBAR 3-4, LUMBAR 4-5 WITH INSTRUMENTATION (Left) - LEFT SIDED LATEARL INTERBODY FUSION, LUMBAR 2-3, LUMBAR 3-4, LUMBAR 4-5 WITH INSTRUMENTATION   Patient Location: PACU  Anesthesia Type:General  Level of Consciousness: awake, alert , oriented and patient cooperative  Airway & Oxygen Therapy: Patient Spontanous Breathing and Patient connected to nasal cannula oxygen  Post-op Assessment: Report given to RN, Post -op Vital signs reviewed and stable and Patient moving all extremities X 4  Post vital signs: Reviewed and stable  Last Vitals:  Vitals:   11/16/15 0720 11/16/15 1310  BP: 104/65 (!) 106/59  Pulse: 73 75  Resp: 20 18  Temp: 36.8 C 36.4 C    Last Pain:  Vitals:   11/16/15 1310  PainSc: 7       Patients Stated Pain Goal: 4 (94/32/00 3794)  Complications: No apparent anesthesia complications

## 2015-11-16 NOTE — Anesthesia Procedure Notes (Addendum)
Procedure Name: Intubation Date/Time: 11/16/2015 8:37 AM Performed by: Carney Living Pre-anesthesia Checklist: Patient identified, Suction available, Emergency Drugs available and Patient being monitored Patient Re-evaluated:Patient Re-evaluated prior to inductionOxygen Delivery Method: Circle system utilized Preoxygenation: Pre-oxygenation with 100% oxygen Intubation Type: IV induction Laryngoscope Size: Miller and 2 Grade View: Grade II Tube type: Oral Tube size: 7.0 mm Number of attempts: 1 Airway Equipment and Method: Stylet Placement Confirmation: ETT inserted through vocal cords under direct vision,  positive ETCO2 and breath sounds checked- equal and bilateral Secured at: 22 cm Tube secured with: Tape Dental Injury: Teeth and Oropharynx as per pre-operative assessment  Comments: Intubation performed by Neville Route, SRNA

## 2015-11-16 NOTE — Anesthesia Preprocedure Evaluation (Addendum)
Anesthesia Evaluation  Patient identified by MRN, date of birth, ID band Patient awake    Reviewed: Allergy & Precautions, NPO status , Patient's Chart, lab work & pertinent test results  Airway Mallampati: II  TM Distance: >3 FB Neck ROM: Full    Dental  (+) Teeth Intact   Pulmonary COPD, Current Smoker,    breath sounds clear to auscultation       Cardiovascular hypertension, + Peripheral Vascular Disease   Rhythm:Regular Rate:Normal     Neuro/Psych    GI/Hepatic GERD  Medicated and Controlled,  Endo/Other    Renal/GU      Musculoskeletal  (+) Arthritis ,   Abdominal   Peds  Hematology   Anesthesia Other Findings   Reproductive/Obstetrics                           Anesthesia Physical Anesthesia Plan  ASA: III  Anesthesia Plan: General   Post-op Pain Management:    Induction: Intravenous  Airway Management Planned: Oral ETT  Additional Equipment:   Intra-op Plan:   Post-operative Plan: Extubation in OR  Informed Consent: I have reviewed the patients History and Physical, chart, labs and discussed the procedure including the risks, benefits and alternatives for the proposed anesthesia with the patient or authorized representative who has indicated his/her understanding and acceptance.   Dental advisory given  Plan Discussed with: CRNA, Anesthesiologist and Surgeon  Anesthesia Plan Comments:         Anesthesia Quick Evaluation

## 2015-11-16 NOTE — Progress Notes (Signed)
Pharmacy Antibiotic Note  Ashley [name] is a 65 y.o. female admitted on 11/14/2015 with left leg pain.  MRI revealed spinal stenosis spanning L2-L5. Pharmacy has been consulted for Vancomycin dosing for surgical prophylaxis. Now s/p left sided lateral interbody fusion L2-3, L3-4,L4-5.  Preop Vancomycin 1g IV x1 given today @ 08:41 AM.  Afebrile, WBC 10.3K, SCr 0.84, CrCl ~ 56 ml/min - no drain   Plan: Vancomycin 1000 mg IV x1 tonight at 22:00 Pharmacy will sign off.     Temp (24hrs), Avg:97.8 F (36.6 C), Min:97.5 F (36.4 C), Max:98.3 F (36.8 C)   Recent Labs Lab 11/14/15 1102  WBC 10.3  CREATININE 0.84    Estimated Creatinine Clearance: 56.4 mL/min (by C-G formula based on SCr of 0.84 mg/dL).    Allergies  Allergen Reactions  . Penicillins Hives and Rash    Has patient had a PCN reaction causing immediate rash, facial/tongue/throat swelling, SOB or lightheadedness with hypotension: Yes Has patient had a PCN reaction causing severe rash involving mucus membranes or skin necrosis: Yes Has patient had a PCN reaction that required hospitalization No Has patient had a PCN reaction occurring within the last 10 years: Yes If all of the above answers are "NO", then may proceed with Cephalosporin use.     Microbiology results: none  Thank you for allowing pharmacy to be a part of this patient's care.  Nicole Cella, RPh Clinical Pharmacist Pager: [phone] 11/16/2015 3:53 PM

## 2015-11-16 NOTE — Op Note (Signed)
NAMEKAMIYA, ACORD                   ACCOUNT NO.:  [id number]  MEDICAL RECORD NO.:  [id number]  LOCATION:  5C15C                        FACILITY:  [facility]  PHYSICIAN:  Phylliss Bob, MD      DATE OF BIRTH:  03/19/1951  DATE OF PROCEDURE:  11/16/2015                              OPERATIVE REPORT   PREOPERATIVE DIAGNOSES: 1. Lumbar degenerative scoliosis. 2. Multilevel spinal stenosis spanning L2-L5.  POSTOPERATIVE DIAGNOSES: 1. Lumbar degenerative scoliosis. 2. Multilevel spinal stenosis spanning L2-L5.  PROCEDURE:  (stage 1 of 2). 1. Anterior lumbar interbody fusion via a left-sided lateral approach     at L2-3, L3-4, L4-5. 2. Insertion of interbody device x3 (NuVasive intervertebral spacers). 3. Use of morselized allograft-DBX mix.  SURGEON:  Phylliss Bob, MD  ASSISTANT:  Pricilla Holm, PAC.  ANESTHESIA:  General endotracheal anesthesia.  COMPLICATIONS:  None.  DISPOSITION:  Stable.  ESTIMATED BLOOD LOSS:  Minimal.  INDICATIONS FOR SURGERY:  Briefly, Ms. Salguero is a very pleasant, 65 year old female, who did present to me with a many year history of pain in her bilateral legs.  The patient's MRI and radiographs did reveal a degenerative lumbar scoliosis and multilevel spinal stenosis.  Given the severity of the patient's pain and lack of improvement with appropriate nonoperative treatment measures, we did discuss proceeding with a two- stage procedure.  Specifically, the 1st stage was to be an anterior lumbar interbody fusion via lateral approach at the levels noted above. The plan was to take the patient back on the following day, for a posterior fusion and instrumentation procedure.  The patient was fully aware of the risks and limitations and did elect to proceed.  OPERATIVE DETAILS:  On November 16, 2015, the patient was brought to surgery and general endotracheal anesthesia was administered. Antibiotics were given.  The patient was placed in the lateral  decubitus position with the left side up.  All bony prominences were padded.  An axillary roll was placed under the patient's right axilla.  The hips and knees were flexed.  The patient was secured to the bed using tape.  The bed was then flexed in order to optimize the exposure of the intervertebral disks.  The left flank was then prepped and draped.  I did position the bed so that the AP did reveal that the L4-5 intervertebral space was perfectly perpendicular to the floor.  I then made a transverse incision overlying the L4-5 intervertebral space.  The external and internal oblique musculature was dissected.  The transversalis fascia was noted and entered.  The peritoneum was then bluntly swept anteriorly.  The psoas was identified and the an initial dilator was advanced across the psoas musculature and docked over the L4- 5 intervertebral space.  A guidewire was placed over the L4-5 intervertebral space.  I then sequentially dilated over the initial guidewire.  A self-retaining retractor was placed which was secured to the bed.  I did use neurologic monitoring while placing the dilators and retractor, and it was clear that there were no neurologic structures in the immediate vicinity of the L4-5 intervertebral space.  I then proceeded with a thorough and complete L4-5 intervertebral diskectomy. I  was very pleased with the diskectomy that I was able to accomplish. The contralateral annulus was released, and after preparing the endplates, the appropriate size interbody spacer was packed with DBX mix and tamped into position in the usual fashion.  I was very pleased with the press-fit.  The retractor was then removed and the wound was closed. The fascia was closed using 0 Vicryl.  The subcutaneous layer was closed using 0 Vicryl, followed by 3-0 Monocryl.  I then made a 2nd transverse incision between the L2-3 and L3-4 intervertebral spaces.  I then developed the plane between the 11th  and 12th ribs.  The fascia was incised and the perineum was bluntly swept anteriorly.  Again, an initial dilator was docked over the L3-4 intervertebral space.  I sequentially dilated using neurologic monitoring.  A self-retaining retractor was placed.  Once again, a thorough and complete L3-4 intervertebral diskectomy was accomplished.  After preparing the endplates, the appropriate size interbody spacer was packed with DBX mix and tamped into position.  The retractor was then removed and an initial dilator was placed over the L2-3 intervertebral space.  Once again, sequential dilation was performed liberally using neurologic monitoring. A self-retaining retractor was placed.  Osteophytes were identified laterally which were removed.  I then proceeded with a thorough L2-3 intervertebral diskectomy.  After preparing the endplates, the appropriate size interbody spacer was packed with DBX mix and tamped into position.  I was very pleased with the final AP and lateral fluoroscopic images at the termination of the procedure.  There was no ischemia.  There was no bleeding encountered.  There was no injury to the peritoneum.  There was no abnormal sustained EMG activity noted throughout the procedure.  The wound was then copiously irrigated.  Once again, the fascia was closed using 0 Vicryl.  The subcutaneous layer was closed using 2-0 Vicryl, followed by 3-0 Monocryl.  Benzoin and Steri- Strips were applied followed by sterile dressing.  All instrument counts were correct at the termination of the procedure.  Of note, Pricilla Holm was my assistant throughout surgery, and did aid in retraction, suctioning and closure from start to finish.     Phylliss Bob, MD     MD/MEDQ  D:  11/16/2015  T:  11/16/2015  Job:  [id number]

## 2015-11-16 NOTE — Anesthesia Postprocedure Evaluation (Signed)
Anesthesia Post Note  Patient: [name]  Procedure(s) Performed: Procedure(s) (LRB): LEFT SIDED LATERAL INTERBODY FUSION, LUMBAR 2-3, LUMBAR 3-4, LUMBAR 4-5 WITH INSTRUMENTATION (Left)  Patient location during evaluation: PACU Anesthesia Type: General Level of consciousness: awake Vital Signs Assessment: post-procedure vital signs reviewed and stable Respiratory status: spontaneous breathing Cardiovascular status: stable Anesthetic complications: no    Last Vitals:  Vitals:   11/16/15 0720 11/16/15 1310  BP: 104/65 (!) 106/59  Pulse: 73 75  Resp: 20 18  Temp: 36.8 C 36.4 C    Last Pain:  Vitals:   11/16/15 1314  PainSc: 7                  EDWARDS,Ashley [name]

## 2015-11-16 NOTE — Anesthesia Preprocedure Evaluation (Signed)
Anesthesia Evaluation  Patient identified by MRN, date of birth, ID band Patient awake    Reviewed: Allergy & Precautions, NPO status , Patient's Chart, lab work & pertinent test results  Airway Mallampati: II  TM Distance: >3 FB     Dental   Pulmonary Current Smoker,           Cardiovascular hypertension, + CAD and + Peripheral Vascular Disease       Neuro/Psych    GI/Hepatic Neg liver ROS, GERD  ,  Endo/Other  negative endocrine ROS  Renal/GU negative Renal ROS     Musculoskeletal  (+) Arthritis ,   Abdominal   Peds  Hematology   Anesthesia Other Findings   Reproductive/Obstetrics                             Anesthesia Physical Anesthesia Plan  ASA: III  Anesthesia Plan: General   Post-op Pain Management:    Induction: Intravenous  Airway Management Planned: Oral ETT  Additional Equipment:   Intra-op Plan:   Post-operative Plan: Possible Post-op intubation/ventilation  Informed Consent: I have reviewed the patients History and Physical, chart, labs and discussed the procedure including the risks, benefits and alternatives for the proposed anesthesia with the patient or authorized representative who has indicated his/her understanding and acceptance.   Dental advisory given  Plan Discussed with: CRNA and Anesthesiologist  Anesthesia Plan Comments:         Anesthesia Quick Evaluation

## 2015-11-17 ENCOUNTER — Encounter (HOSPITAL_COMMUNITY): Payer: Self-pay | Admitting: Anesthesiology

## 2015-11-17 ENCOUNTER — Inpatient Hospital Stay (HOSPITAL_COMMUNITY): Payer: Medicare Other | Admitting: Anesthesiology

## 2015-11-17 ENCOUNTER — Inpatient Hospital Stay (HOSPITAL_COMMUNITY): Payer: Medicare Other

## 2015-11-17 ENCOUNTER — Encounter (HOSPITAL_COMMUNITY)
Admission: RE | Disposition: A | Discharge status: Home Health | Payer: Self-pay | Source: Ambulatory Visit | Attending: Orthopedic Surgery

## 2015-11-17 ENCOUNTER — Inpatient Hospital Stay (HOSPITAL_COMMUNITY): Admission: RE | Admit: 2015-11-17 | Payer: Medicare Other | Source: Ambulatory Visit | Admitting: Orthopedic Surgery

## 2015-11-17 DIAGNOSIS — J439 Emphysema, unspecified: Secondary | ICD-10-CM

## 2015-11-17 DIAGNOSIS — I509 Heart failure, unspecified: Secondary | ICD-10-CM

## 2015-11-17 DIAGNOSIS — M5410 Radiculopathy, site unspecified: Secondary | ICD-10-CM

## 2015-11-17 DIAGNOSIS — I959 Hypotension, unspecified: Secondary | ICD-10-CM

## 2015-11-17 DIAGNOSIS — J9600 Acute respiratory failure, unspecified whether with hypoxia or hypercapnia: Secondary | ICD-10-CM

## 2015-11-17 DIAGNOSIS — J441 Chronic obstructive pulmonary disease with (acute) exacerbation: Secondary | ICD-10-CM

## 2015-11-17 DIAGNOSIS — J9621 Acute and chronic respiratory failure with hypoxia: Secondary | ICD-10-CM

## 2015-11-17 DIAGNOSIS — Z419 Encounter for procedure for purposes other than remedying health state, unspecified: Secondary | ICD-10-CM

## 2015-11-17 DIAGNOSIS — J431 Panlobular emphysema: Secondary | ICD-10-CM

## 2015-11-17 HISTORY — DX: Heart failure, unspecified: I50.9

## 2015-11-17 HISTORY — DX: Emphysema, unspecified: J43.9

## 2015-11-17 LAB — ECHOCARDIOGRAM COMPLETE
EWDT: 239 ms
FS: 27 % — AB (ref 28–44)
Height: 62 in
IV/PV OW: 1.29
LA ID, A-P, ES: 33 mm
LA diam end sys: 33 mm
LA vol index: 29.4 mL/m2
LA vol: 48 mL
LADIAMINDEX: 2.02 cm/m2
LAVOLA4C: 43.4 mL
LV PW d: 7 mm — AB (ref 0.6–1.1)
LV TDI E'LATERAL: 10.6
LV TDI E'MEDIAL: 7.83
LVELAT: 10.6 cm/s
LVOT area: 3.8 cm2
LVOT diameter: 22 mm
MV Dec: 239
MVPKEVEL: 0.9 m/s
RV LATERAL S' VELOCITY: 17 cm/s
RV TAPSE: 31.1 mm
Weight: 2192 oz

## 2015-11-17 LAB — BLOOD GAS, ARTERIAL
Acid-Base Excess: 1 mmol/L (ref 0.0–2.0)
Bicarbonate: 25.1 mEq/L — ABNORMAL HIGH (ref 20.0–24.0)
Drawn by: 280981
O2 Content: 5 L/min
O2 SAT: 93.2 %
PCO2 ART: 40.6 mmHg (ref 35.0–45.0)
PO2 ART: 66.1 mmHg — AB (ref 80.0–100.0)
Patient temperature: 98.6
TCO2: 26.4 mmol/L (ref 0–100)
pH, Arterial: 7.409 (ref 7.350–7.450)

## 2015-11-17 SURGERY — POSTERIOR LUMBAR FUSION 3 LEVEL
Anesthesia: General

## 2015-11-17 MED ORDER — BISACODYL 5 MG PO TBEC
5.0000 mg | DELAYED_RELEASE_TABLET | Freq: Every day | ORAL | Status: DC | PRN
  Administered 2015-11-20: 5 mg via ORAL
  Filled 2015-11-17: qty 1

## 2015-11-17 MED ORDER — SODIUM CHLORIDE 0.9 % IV SOLN
INTRAVENOUS | Status: DC
  Administered 2015-11-17: 15:00:00 via INTRAVENOUS

## 2015-11-17 MED ORDER — METHYLENE BLUE 0.5 % INJ SOLN
INTRAVENOUS | Status: AC
  Filled 2015-11-17: qty 10

## 2015-11-17 MED ORDER — PROMETHAZINE HCL 25 MG/ML IJ SOLN: 6.2500 mg | INTRAMUSCULAR | Status: DC | PRN

## 2015-11-17 MED ORDER — OXYCODONE HCL 5 MG PO TABS: 5.0000 mg | ORAL_TABLET | Freq: Once | ORAL | Status: DC | PRN

## 2015-11-17 MED ORDER — DEXTROSE 5 % IV SOLN
INTRAVENOUS | Status: DC | PRN
  Administered 2015-11-17: 08:00:00 via INTRAVENOUS

## 2015-11-17 MED ORDER — EPHEDRINE SULFATE 50 MG/ML IJ SOLN
INTRAMUSCULAR | Status: DC | PRN
  Administered 2015-11-17: 15 mg via INTRAVENOUS
  Administered 2015-11-17: 5 mg via INTRAVENOUS
  Administered 2015-11-17: 10 mg via INTRAVENOUS

## 2015-11-17 MED ORDER — MINERAL OIL LIGHT 100 % EX OIL
TOPICAL_OIL | CUTANEOUS | Status: AC
  Filled 2015-11-17: qty 25

## 2015-11-17 MED ORDER — 0.9 % SODIUM CHLORIDE (POUR BTL) OPTIME
TOPICAL | Status: DC | PRN
Start: 2015-11-17 — End: 2015-11-17
  Administered 2015-11-17: 1000 mL

## 2015-11-17 MED ORDER — LACTATED RINGERS IV SOLN
INTRAVENOUS | Status: DC | PRN
  Administered 2015-11-17: 07:00:00 via INTRAVENOUS

## 2015-11-17 MED ORDER — SODIUM CHLORIDE 0.9% FLUSH
3.0000 mL | Freq: Two times a day (BID) | INTRAVENOUS | Status: DC
  Administered 2015-11-17 – 2015-11-21 (×7): 3 mL via INTRAVENOUS

## 2015-11-17 MED ORDER — VANCOMYCIN HCL IN DEXTROSE 1-5 GM/200ML-% IV SOLN
1000.0000 mg | INTRAVENOUS | Status: AC
  Administered 2015-11-17: 1000 mg via INTRAVENOUS
  Filled 2015-11-17: qty 200

## 2015-11-17 MED ORDER — ZOLPIDEM TARTRATE 5 MG PO TABS: 5.0000 mg | ORAL_TABLET | Freq: Every evening | ORAL | Status: DC | PRN

## 2015-11-17 MED ORDER — DIAZEPAM 5 MG PO TABS
5.0000 mg | ORAL_TABLET | Freq: Four times a day (QID) | ORAL | Status: DC | PRN
  Administered 2015-11-17 – 2015-11-18 (×2): 5 mg via ORAL
  Filled 2015-11-17: qty 1

## 2015-11-17 MED ORDER — FUROSEMIDE 10 MG/ML IJ SOLN
40.0000 mg | Freq: Once | INTRAMUSCULAR | Status: AC | PRN
  Administered 2015-11-17: 40 mg via INTRAVENOUS
  Filled 2015-11-17: qty 4

## 2015-11-17 MED ORDER — TRAMADOL HCL 50 MG PO TABS
100.0000 mg | ORAL_TABLET | Freq: Four times a day (QID) | ORAL | Status: DC | PRN
  Administered 2015-11-17 – 2015-11-20 (×5): 100 mg via ORAL
  Filled 2015-11-17 (×5): qty 2

## 2015-11-17 MED ORDER — PROPOFOL 500 MG/50ML IV EMUL
INTRAVENOUS | Status: DC | PRN
  Administered 2015-11-17: 50 ug/kg/min via INTRAVENOUS

## 2015-11-17 MED ORDER — PROPOFOL 10 MG/ML IV BOLUS
INTRAVENOUS | Status: DC | PRN
  Administered 2015-11-17: 130 mg via INTRAVENOUS

## 2015-11-17 MED ORDER — ONDANSETRON HCL 4 MG/2ML IJ SOLN
4.0000 mg | INTRAMUSCULAR | Status: DC | PRN
  Administered 2015-11-20: 4 mg via INTRAVENOUS

## 2015-11-17 MED ORDER — VANCOMYCIN HCL IN DEXTROSE 1-5 GM/200ML-% IV SOLN
1000.0000 mg | Freq: Once | INTRAVENOUS | Status: AC
  Administered 2015-11-17: 1000 mg via INTRAVENOUS
  Filled 2015-11-17: qty 200

## 2015-11-17 MED ORDER — PHENYLEPHRINE HCL 10 MG/ML IJ SOLN
INTRAMUSCULAR | Status: DC | PRN
  Administered 2015-11-17: 80 ug via INTRAVENOUS

## 2015-11-17 MED ORDER — DOCUSATE SODIUM 100 MG PO CAPS
100.0000 mg | ORAL_CAPSULE | Freq: Two times a day (BID) | ORAL | Status: DC
  Administered 2015-11-17 – 2015-11-21 (×8): 100 mg via ORAL
  Filled 2015-11-17 (×8): qty 1

## 2015-11-17 MED ORDER — PHENOL 1.4 % MT LIQD: 1.0000 | OROMUCOSAL | Status: DC | PRN

## 2015-11-17 MED ORDER — SUCCINYLCHOLINE CHLORIDE 20 MG/ML IJ SOLN
INTRAMUSCULAR | Status: DC | PRN
  Administered 2015-11-17: 80 mg via INTRAVENOUS

## 2015-11-17 MED ORDER — MENTHOL 3 MG MT LOZG: 1.0000 | LOZENGE | OROMUCOSAL | Status: DC | PRN

## 2015-11-17 MED ORDER — ARTIFICIAL TEARS OP OINT
TOPICAL_OINTMENT | OPHTHALMIC | Status: DC | PRN
  Administered 2015-11-17: 1 via OPHTHALMIC

## 2015-11-17 MED ORDER — HEMOSTATIC AGENTS (NO CHARGE) OPTIME
TOPICAL | Status: DC | PRN
  Administered 2015-11-17: 1 via TOPICAL

## 2015-11-17 MED ORDER — IPRATROPIUM-ALBUTEROL 0.5-2.5 (3) MG/3ML IN SOLN
3.0000 mL | RESPIRATORY_TRACT | Status: DC | PRN
  Administered 2015-11-19: 3 mL via RESPIRATORY_TRACT
  Filled 2015-11-17: qty 3

## 2015-11-17 MED ORDER — LIDOCAINE HCL (CARDIAC) 20 MG/ML IV SOLN
INTRAVENOUS | Status: DC | PRN
  Administered 2015-11-17: 50 mg via INTRAVENOUS
  Administered 2015-11-17: 100 mg via INTRAVENOUS

## 2015-11-17 MED ORDER — ALBUMIN HUMAN 5 % IV SOLN
INTRAVENOUS | Status: DC | PRN
  Administered 2015-11-17: 09:00:00 via INTRAVENOUS

## 2015-11-17 MED ORDER — OXYCODONE-ACETAMINOPHEN 5-325 MG PO TABS
ORAL_TABLET | ORAL | Status: AC
  Filled 2015-11-17: qty 1

## 2015-11-17 MED ORDER — SODIUM CHLORIDE 0.9% FLUSH: 3.0000 mL | INTRAVENOUS | Status: DC | PRN

## 2015-11-17 MED ORDER — FLEET ENEMA 7-19 GM/118ML RE ENEM: 1.0000 | ENEMA | Freq: Once | RECTAL | Status: DC | PRN

## 2015-11-17 MED ORDER — IPRATROPIUM-ALBUTEROL 0.5-2.5 (3) MG/3ML IN SOLN
3.0000 mL | Freq: Once | RESPIRATORY_TRACT | Status: AC
  Administered 2015-11-17: 3 mL via RESPIRATORY_TRACT
  Filled 2015-11-17: qty 3

## 2015-11-17 MED ORDER — ACETAMINOPHEN 325 MG PO TABS
650.0000 mg | ORAL_TABLET | ORAL | Status: DC | PRN
  Administered 2015-11-17 – 2015-11-18 (×2): 650 mg via ORAL
  Filled 2015-11-17 (×2): qty 2

## 2015-11-17 MED ORDER — SENNOSIDES-DOCUSATE SODIUM 8.6-50 MG PO TABS: 1.0000 | ORAL_TABLET | Freq: Every evening | ORAL | Status: DC | PRN

## 2015-11-17 MED ORDER — HYDROMORPHONE HCL 1 MG/ML IJ SOLN
0.2500 mg | INTRAMUSCULAR | Status: DC | PRN
  Administered 2015-11-17: 0.5 mg via INTRAVENOUS

## 2015-11-17 MED ORDER — BUPIVACAINE-EPINEPHRINE (PF) 0.25% -1:200000 IJ SOLN
INTRAMUSCULAR | Status: AC
  Filled 2015-11-17: qty 30

## 2015-11-17 MED ORDER — ACETAMINOPHEN 650 MG RE SUPP: 650.0000 mg | RECTAL | Status: DC | PRN

## 2015-11-17 MED ORDER — ONDANSETRON HCL 4 MG/2ML IJ SOLN
INTRAMUSCULAR | Status: DC | PRN
  Administered 2015-11-17: 4 mg via INTRAVENOUS

## 2015-11-17 MED ORDER — POVIDONE-IODINE 7.5 % EX SOLN
Freq: Once | CUTANEOUS | Status: DC
  Filled 2015-11-17: qty 118

## 2015-11-17 MED ORDER — MIDAZOLAM HCL 5 MG/5ML IJ SOLN
INTRAMUSCULAR | Status: DC | PRN
  Administered 2015-11-17: 2 mg via INTRAVENOUS

## 2015-11-17 MED ORDER — BUPIVACAINE-EPINEPHRINE 0.25% -1:200000 IJ SOLN
INTRAMUSCULAR | Status: DC | PRN
  Administered 2015-11-17: 30 mL

## 2015-11-17 MED ORDER — OXYCODONE HCL 5 MG/5ML PO SOLN: 5.0000 mg | Freq: Once | ORAL | Status: DC | PRN

## 2015-11-17 MED ORDER — HYDROMORPHONE HCL 1 MG/ML IJ SOLN: 0.5000 mg | INTRAMUSCULAR | Status: DC | PRN

## 2015-11-17 MED ORDER — SODIUM CHLORIDE 0.9 % IV SOLN: 250.0000 mL | INTRAVENOUS | Status: DC

## 2015-11-17 MED ORDER — FENTANYL CITRATE (PF) 100 MCG/2ML IJ SOLN
INTRAMUSCULAR | Status: DC | PRN
  Administered 2015-11-17 (×3): 25 ug via INTRAVENOUS
  Administered 2015-11-17: 50 ug via INTRAVENOUS
  Administered 2015-11-17 (×2): 25 ug via INTRAVENOUS
  Administered 2015-11-17: 50 ug via INTRAVENOUS
  Administered 2015-11-17: 25 ug via INTRAVENOUS

## 2015-11-17 MED ORDER — DIAZEPAM 5 MG PO TABS
ORAL_TABLET | ORAL | Status: AC
  Filled 2015-11-17: qty 1

## 2015-11-17 MED ORDER — PHENYLEPHRINE HCL 10 MG/ML IJ SOLN
INTRAVENOUS | Status: DC | PRN
  Administered 2015-11-17: 25 ug/min via INTRAVENOUS

## 2015-11-17 MED ORDER — THROMBIN 20000 UNITS EX KIT
PACK | CUTANEOUS | Status: DC | PRN
Start: 2015-11-17 — End: 2015-11-17
  Administered 2015-11-17: 20000 [IU] via TOPICAL

## 2015-11-17 MED ORDER — OXYCODONE-ACETAMINOPHEN 5-325 MG PO TABS
1.0000 | ORAL_TABLET | ORAL | Status: DC | PRN
  Administered 2015-11-17 – 2015-11-18 (×4): 1 via ORAL
  Administered 2015-11-19 (×2): 2 via ORAL
  Administered 2015-11-19 (×2): 1 via ORAL
  Administered 2015-11-19 – 2015-11-21 (×6): 2 via ORAL
  Filled 2015-11-17 (×3): qty 2
  Filled 2015-11-17 (×2): qty 1
  Filled 2015-11-17: qty 2
  Filled 2015-11-17: qty 1
  Filled 2015-11-17 (×2): qty 2
  Filled 2015-11-17 (×2): qty 1
  Filled 2015-11-17 (×2): qty 2

## 2015-11-17 MED ORDER — HYDROMORPHONE HCL 1 MG/ML IJ SOLN
INTRAMUSCULAR | Status: AC
  Filled 2015-11-17: qty 1

## 2015-11-17 MED ORDER — THROMBIN 20000 UNITS EX SOLR
CUTANEOUS | Status: AC
  Filled 2015-11-17: qty 20000

## 2015-11-17 MED ORDER — ALUM & MAG HYDROXIDE-SIMETH 200-200-20 MG/5ML PO SUSP: 30.0000 mL | Freq: Four times a day (QID) | ORAL | Status: DC | PRN

## 2015-11-17 MED FILL — Sodium Chloride IV Soln 0.9%: INTRAVENOUS | Qty: 1000 | Status: AC

## 2015-11-17 MED FILL — Thrombin For Soln 20000 Unit: CUTANEOUS | Qty: 1 | Status: AC

## 2015-11-17 MED FILL — Heparin Sodium (Porcine) Inj 1000 Unit/ML: INTRAMUSCULAR | Qty: 30 | Status: AC

## 2015-11-17 SURGICAL SUPPLY — 88 items
BENZOIN TINCTURE PRP APPL 2/3 (GAUZE/BANDAGES/DRESSINGS) IMPLANT
BLADE SURG ROTATE 9660 (MISCELLANEOUS) IMPLANT
BUR PRESCISION 1.7 ELITE (BURR) ×3 IMPLANT
BUR ROUND PRECISION 4.0 (BURR) ×2 IMPLANT
BUR ROUND PRECISION 4.0MM (BURR) ×1
BUR SABER RD CUTTING 3.0 (BURR) IMPLANT
BUR SABER RD CUTTING 3.0MM (BURR)
CARTRIDGE OIL MAESTRO DRILL (MISCELLANEOUS) ×1 IMPLANT
CLOSURE WOUND 1/2 X4 (GAUZE/BANDAGES/DRESSINGS)
COVER SURGICAL LIGHT HANDLE (MISCELLANEOUS) ×3 IMPLANT
DIFFUSER DRILL AIR PNEUMATIC (MISCELLANEOUS) ×3 IMPLANT
DRAIN CHANNEL 15F RND FF W/TCR (WOUND CARE) ×3 IMPLANT
DRAPE C-ARM 42X72 X-RAY (DRAPES) ×3 IMPLANT
DRAPE C-ARMOR (DRAPES) IMPLANT
DRAPE ORTHO SPLIT 77X108 STRL (DRAPES) ×2
DRAPE POUCH INSTRU U-SHP 10X18 (DRAPES) ×3 IMPLANT
DRAPE SURG 17X23 STRL (DRAPES) ×12 IMPLANT
DRAPE SURG ORHT 6 SPLT 77X108 (DRAPES) ×1 IMPLANT
DRSG MEPILEX BORDER 4X12 (GAUZE/BANDAGES/DRESSINGS) IMPLANT
DRSG MEPILEX BORDER 4X8 (GAUZE/BANDAGES/DRESSINGS) IMPLANT
DURAPREP 26ML APPLICATOR (WOUND CARE) ×3 IMPLANT
ELECT BLADE 4.0 EZ CLEAN MEGAD (MISCELLANEOUS) ×3
ELECT CAUTERY BLADE 6.4 (BLADE) ×3 IMPLANT
ELECT REM PT RETURN 9FT ADLT (ELECTROSURGICAL) ×3
ELECTRODE BLDE 4.0 EZ CLN MEGD (MISCELLANEOUS) ×1 IMPLANT
ELECTRODE REM PT RTRN 9FT ADLT (ELECTROSURGICAL) ×1 IMPLANT
EVACUATOR SILICONE 100CC (DRAIN) ×3 IMPLANT
FILTER STRAW FLUID ASPIR (MISCELLANEOUS) ×3 IMPLANT
GAUZE SPONGE 4X4 16PLY XRAY LF (GAUZE/BANDAGES/DRESSINGS) ×12 IMPLANT
GLOVE BIO SURGEON STRL SZ7 (GLOVE) ×6 IMPLANT
GLOVE BIO SURGEON STRL SZ8 (GLOVE) ×3 IMPLANT
GLOVE BIOGEL PI IND STRL 7.0 (GLOVE) ×1 IMPLANT
GLOVE BIOGEL PI IND STRL 8 (GLOVE) ×1 IMPLANT
GLOVE BIOGEL PI INDICATOR 7.0 (GLOVE) ×2
GLOVE BIOGEL PI INDICATOR 8 (GLOVE) ×2
GOWN STRL REUS W/ TWL LRG LVL3 (GOWN DISPOSABLE) ×2 IMPLANT
GOWN STRL REUS W/ TWL XL LVL3 (GOWN DISPOSABLE) ×1 IMPLANT
GOWN STRL REUS W/TWL LRG LVL3 (GOWN DISPOSABLE) ×4
GOWN STRL REUS W/TWL XL LVL3 (GOWN DISPOSABLE) ×2
GUIDEWIRE BLUNT VIPER II 1.45 (WIRE) ×3 IMPLANT
GUIDEWIRE SHARP VIPER II (WIRE) ×27 IMPLANT
IV CATH 14GX2 1/4 (CATHETERS) ×3 IMPLANT
KIT BASIN OR (CUSTOM PROCEDURE TRAY) ×3 IMPLANT
KIT POSITION SURG JACKSON T1 (MISCELLANEOUS) ×3 IMPLANT
KIT ROOM TURNOVER OR (KITS) ×3 IMPLANT
MARKER SKIN DUAL TIP RULER LAB (MISCELLANEOUS) ×3 IMPLANT
NEEDLE HYPO 25GX1X1/2 BEV (NEEDLE) ×3 IMPLANT
NEEDLE JAMSHIDI VIPER (NEEDLE) ×9 IMPLANT
NEEDLE SPNL 18GX3.5 QUINCKE PK (NEEDLE) ×6 IMPLANT
NS IRRIG 1000ML POUR BTL (IV SOLUTION) ×3 IMPLANT
OIL CARTRIDGE MAESTRO DRILL (MISCELLANEOUS) ×3
PACK LAMINECTOMY ORTHO (CUSTOM PROCEDURE TRAY) ×3 IMPLANT
PACK UNIVERSAL I (CUSTOM PROCEDURE TRAY) ×3 IMPLANT
PAD ARMBOARD 7.5X6 YLW CONV (MISCELLANEOUS) ×6 IMPLANT
PATTIES SURGICAL .5 X.5 (GAUZE/BANDAGES/DRESSINGS) ×3 IMPLANT
PATTIES SURGICAL .5 X1 (DISPOSABLE) ×3 IMPLANT
PATTIES SURGICAL .5 X3 (DISPOSABLE) ×3 IMPLANT
PATTIES SURGICAL .5X1.5 (GAUZE/BANDAGES/DRESSINGS) ×3 IMPLANT
PUTTY BONE DBX 2.5 MIS (Bone Implant) ×3 IMPLANT
ROD VIPER LORD 5.5X110MM (Rod) ×6 IMPLANT
SCREW SET SINGLE INNER MIS (Screw) ×24 IMPLANT
SCREW VIPER X-TAB 6.0X40 (Screw) ×12 IMPLANT
SCREW XTAB POLY VIPER  7X45 (Screw) ×8 IMPLANT
SCREW XTAB POLY VIPER 7X45 (Screw) ×4 IMPLANT
SPONGE INTESTINAL PEANUT (DISPOSABLE) ×3 IMPLANT
SPONGE SURGIFOAM ABS GEL 100 (HEMOSTASIS) IMPLANT
STRIP CLOSURE SKIN 1/2X4 (GAUZE/BANDAGES/DRESSINGS) IMPLANT
STYLET INTUB SATIN SLIP 14FR (MISCELLANEOUS) ×3 IMPLANT
SURGIFLO W/THROMBIN 8M KIT (HEMOSTASIS) IMPLANT
SUT MNCRL AB 4-0 PS2 18 (SUTURE) ×9 IMPLANT
SUT VIC AB 0 CT1 18XCR BRD 8 (SUTURE) ×2 IMPLANT
SUT VIC AB 0 CT1 8-18 (SUTURE) ×4
SUT VIC AB 1 CT1 18XCR BRD 8 (SUTURE) ×2 IMPLANT
SUT VIC AB 1 CT1 8-18 (SUTURE) ×4
SUT VIC AB 2-0 CT1 18 (SUTURE) IMPLANT
SUT VIC AB 2-0 CT2 18 VCP726D (SUTURE) ×6 IMPLANT
SYR 20CC LL (SYRINGE) ×3 IMPLANT
SYR BULB IRRIGATION 50ML (SYRINGE) ×3 IMPLANT
SYR CONTROL 10ML LL (SYRINGE) ×3 IMPLANT
SYR TB 1ML LUER SLIP (SYRINGE) ×3 IMPLANT
TAP CANN VIPER2 DL 5.0 (TAP) ×6 IMPLANT
TAP CANN VIPER2 DL 6.0 (TAP) ×4 IMPLANT
TOWEL OR 17X24 6PK STRL BLUE (TOWEL DISPOSABLE) ×3 IMPLANT
TOWEL OR 17X26 10 PK STRL BLUE (TOWEL DISPOSABLE) ×3 IMPLANT
TRAY FOLEY CATH 16FRSI W/METER (SET/KITS/TRAYS/PACK) ×3 IMPLANT
WATER STERILE IRR 1000ML POUR (IV SOLUTION) IMPLANT
YANKAUER SUCT BULB TIP NO VENT (SUCTIONS) ×3 IMPLANT
vIPERer tab 6mm ×6 IMPLANT

## 2015-11-17 NOTE — Progress Notes (Signed)
Anesthesia called back, they spoke with anaesthesiologist on case, he stated that he signed off on pt and no longer managing care. rn encouraged to call rapid response if needed.

## 2015-11-17 NOTE — Progress Notes (Signed)
Pt reports prior to surgery weight 137, found preop weight documented on 7/25 of 137.6. Now pt weighing 151.8.  Weight increase of 14.2 pounds. md made aware and also made aware ABG resulted.

## 2015-11-17 NOTE — Progress Notes (Addendum)
Pt arrived to floor. Pt alert and oriented x4. PACU rn and this rn laid pt flat and had her turn so this rn could assess dressing. Pt was only laying flat for less than 1 minute. Pt was already on 4 L [place]. Pt started gasping for air, pt was sat upright in the bed. Pt started having drainage from both eyes, not answering questions for a minute.O2 91%.  Lungs rhonchi. Pt instructed to cough. Pt seemed to get a little better after coughing. rn asked if pt was okay and why she was crying. Pt did not know she was crying. Pt has facial swelling, assumed from surgery and being positioned on abdomen during surgery.   Pt put on continuous pulse ox, pt O2 now at highest 93%. Pt did incentive spirometer - 900.  Pt repositioned higher up in the bed. Able to answer all questions again.

## 2015-11-17 NOTE — Significant Event (Signed)
Rapid Response Event Note  Overview:  Called by Rn for patient requiring increased oxygen Time Called: 1453 Arrival Time: 1500 Event Type: Respiratory  Initial Focused Assessment:  Called by RN for patient recently back from PACU now needing increased oxygen.  Upon my arrival to patients room, RN and family at bedside.  Patient is bed using incentive spirometer, able to obtain 750-900 cc.  Patient now on 5 LPM nasal cannula sats 94%, RR 13, HR 71.  Patient and family state her eyes are watering but that is due to allergies. skin warm and dry. Patient states she occassionally has to catch her breath and is in pain   Interventions: Breath Sounds clear and diminished bilaterally.  MD was paged prior to my arrival.  Plan of Care (if not transferred):  RN to monitor and patient   Event Summary:  RN to call if assistance needed   at      at          [facility], Harlin Rain

## 2015-11-17 NOTE — Op Note (Signed)
NAMEJODECI, Ashley [name]                   ACCOUNT NO.:  [id number]  MEDICAL RECORD NO.:  [id number]  LOCATION:  [facility]                         FACILITY:  [facility]  PHYSICIAN:  Phylliss Bob, MD      DATE OF BIRTH:  11-05-1950  DATE OF PROCEDURE:  11/17/2015                              OPERATIVE REPORT   PREOPERATIVE DIAGNOSES: 1. Lumbar degenerative scoliosis. 2. Spinal stenosis spanning L2-L5. 3. Status post anterior lumbar interbody fusion via left-sided lateral     approach at L2-3, L3-4, L4-5 the day prior.  POSTOPERATIVE DIAGNOSES: 1. Lumbar degenerative scoliosis. 2. Spinal stenosis spanning L2-L5. 3. Status post anterior lumbar interbody fusion via left-sided lateral     approach at L2-3, L3-4, L4-5 the day prior.  PROCEDURE (stage 2 of 2): 1. Posterior spinal fusion, L2-3, L3-4, L4-5. 2. Placement of posterior segmental instrumentation, L2-L5. 3. Use of morselized allograft-DBX mix. 4. Intraoperative use of fluoroscopy.  SURGEON:  Phylliss Bob, MD  ASSISTANT:  Pricilla Holm, PAC.  ANESTHESIA:  Endotracheal anesthesia.  COMPLICATIONS:  None.  DISPOSITION:  Stable.  ESTIMATED BLOOD LOSS:  Minimal.  INDICATIONS FOR SURGERY:  Briefly, Ashley [name] is a pleasant 65 year old female, who did present to me with pain in her back and bilateral legs. Please refer to my operative report dated November 16, 2015, for a full account of the patient's history and justification for surgery.  The patient was brought for stage 1 of a 2-stage procedure yesterday, which went very well.  The patient presented to the operating room today for stage 2 of her two-stage procedure as noted above.  OPERATIVE DETAILS:  On November 17, 2015, the patient was brought to Surgery and general endotracheal anesthesia was administered.  The patient was placed prone on a well-padded flat Jackson bed with a spinal frame.  Antibiotics were given.  A time-out procedure was performed. Paramedian incisions were then  made lateral to the pedicles on the left and right sides.  Then, in a minimally invasive fashion, Jamshidi needles were advanced across the L2, L3, L4, and L5 pedicles bilaterally.  I then tapped over the guidewires.  I did use triggered EMG to test each of the taps, and there was no tap that tested below 15 milliamps.  On the left side, I did expose the facet joints at L2-3, L3- 4, and L4-5 subperiosteally.  A 4 mm burr was used to decorticate the facet joints and DBX mix was packed into the facet joint and posterolateral gutter to help aid in the success of the posterolateral fusion.  I then placed screws.  At L2 and L3, a 6 x 40 mm screws were placed, 7 x 45 mm screws were placed at L4 and L5 bilaterally.  I was very pleased with the press-fit of each of the screws and the appearance of the AP and lateral fluoroscopic radiographs.  A 110 mm rods were then secured into the tulip heads of the screws, caps were placed, and a final locking procedure was performed.  Of note, given the substantial segmental collapse on the left side across the L2-3 intervertebral space, I did apply distraction to help try to restore the patient's  normal coronal alignment.  The caps were then secured to the tulip heads and a final locking procedure was performed at all 5 caps.  I was very pleased with the radiographs.  The wound was copiously irrigated.  The wound was then closed in layers using #1 Vicryl followed by 2-0 Vicryl, followed by 3-0 Monocryl.  Benzoin and Steri-Strips were applied followed by sterile dressing.  All instrument counts were correct at the termination of the procedure.  Of note, Pricilla Holm was my assistant throughout surgery, and did aid in retraction, suctioning, and closure from start to finish.     Phylliss Bob, MD     MD/MEDQ  D:  11/17/2015  T:  11/17/2015  Job:  [id number]

## 2015-11-17 NOTE — Transfer of Care (Signed)
Immediate Anesthesia Transfer of Care Note  Patient: Ashley [name]  Procedure(s) Performed: Procedure(s) with comments: POSTERIOR LUMBAR FUSION, LUMBAR 2-3, LUMBAR 3-4, LUMBAR 4-5 WITH INSTRUMENTATION AND ALLOGRAFT (N/A) - POSTERIOR LUMBAR FUSION, LUMBAR 2-3, LUMBAR 3-4, LUMBAR 4-5 WITH INSTRUMENTATION AND ALLOGRAFT  Patient Location: PACU  Anesthesia Type:General  Level of Consciousness: awake, sedated, patient cooperative and responds to stimulation  Airway & Oxygen Therapy: Patient Spontanous Breathing and Patient connected to nasal cannula oxygen  Post-op Assessment: Post -op Vital signs reviewed and stable, Patient moving all extremities and Patient moving all extremities X 4  Post vital signs: Reviewed and stable  Last Vitals:  Vitals:   11/17/15 0145 11/17/15 0540  BP: (!) 103/58 (!) 109/58  Pulse: (!) 58 64  Resp: 16 18  Temp: 37.2 C 36.9 C    Last Pain:  Vitals:   11/17/15 0546  TempSrc:   PainSc: Asleep      Patients Stated Pain Goal: 4 (92/44/62 [id number])  Complications: No apparent anesthesia complications

## 2015-11-17 NOTE — Consult Note (Signed)
Medical Consultation   Ashley [name]  KVQ:[id number]  DOB: 1950/08/22  DOA: 11/16/2015  PCP: [name], NP   Outpatient Specialists: Cardiology, Ortho/spine, GYN, H/O   Requesting physician: Dr Lynann Bologna - ortho  Reason for consultation: Hypoxemia   History of Present Illness: Ashley [name] is an 65 y.o. female extensive past medical history significant for breast cancer status post mastectomy, ascending aortic aneurysm, CAD, HTN, HLD, GERD, idiopathic thrombocytosis, presenting to [facility] for the second of 2 spinal surgeries performed by Dr. Lynann Bologna. Shortly after arriving in the PACU patient was noted to have labored breathing and to be hypoxic. Of note patient with a long standing history of smoking likely around 40-50 pack years. Patient states that from time to time she will become short of breath but this is constant and getting worse. Improves with sitting up and O2 by nasal cannula. Denies any recent lower extremity swelling, chest pain, palpitations. No history of heart failure. Patient seems to smoke. Denies any wheezing or productive cough, or fevers. Patient's only pain complaint at this point time is at her surgical site.   Review of Systems:  ROS As per HPI otherwise 10 point review of systems negative.    Past Medical History: Past Medical History:  Diagnosis Date  . Arthritis   . Ascending aortic aneurysm ([place])   . Cancer [facility]) 1995   breast, left, mastectomy/chemo  . Chest pain    Possibly cardiac. No evidence of ischemia/injury based upon normal troponin I. Chest discomfort could be tachycardia induced supply demand mismatch.   . Colon adenomas   . Coronary artery disease   . Essential hypertension, benign   . GERD (gastroesophageal reflux disease)   . Hyperlipidemia   . Hypertension   . Palpitations   . Pure hypercholesterolemia   . Thrombocytosis ([place])    Idiopathic    Past Surgical History: Past Surgical History:    Procedure Laterality Date  . ANTERIOR AND POSTERIOR REPAIR N/A 12/09/2014   Procedure: ANTERIOR (CYSTOCELE) AND POSTERIOR REPAIR (RECTOCELE);  Surgeon: Bjorn Loser, MD;  Location: [place] [facility];  Service: Urology;  Laterality: N/A;  . APPENDECTOMY    . BONE MARROW ASPIRATION  07/2012  . BONE MARROW BIOPSY  07/2012  . BREAST SURGERY    . CARDIAC CATHETERIZATION    . COLONOSCOPY  11/29/2010   Procedure: COLONOSCOPY;  Surgeon: Rogene Houston, MD;  [facility];  Service: Endoscopy;  Laterality: N/A;  . COLONOSCOPY N/A 02/18/2014   Procedure: COLONOSCOPY;  Surgeon: Rogene Houston, MD;  [facility];  Service: Endoscopy;  Laterality: N/A;  1030  . CYSTOSCOPY N/A 12/09/2014   Procedure: CYSTOSCOPY;  Surgeon: Bjorn Loser, MD;  Location: [place] [facility];  Service: Urology;  Laterality: N/A;  . MASTECTOMY     left  . OVARIAN CYST SURGERY     x2  . SALPINGOOPHORECTOMY Bilateral 12/09/2014   Procedure: SALPINGO OOPHORECTOMY;  Surgeon: Servando Salina, MD;  Location: [place] [facility];  Service: Gynecology;  Laterality: Bilateral;  . TUBAL LIGATION    . VAGINAL HYSTERECTOMY N/A 12/09/2014   Procedure: HYSTERECTOMY VAGINAL;  Surgeon: Servando Salina, MD;  Location: [place] [facility];  Service: Gynecology;  Laterality: N/A;     Allergies:   Allergies  Allergen Reactions  . Penicillins Hives and Rash    Has patient had a PCN reaction causing immediate rash, facial/tongue/throat swelling, SOB or lightheadedness with hypotension:  Yes Has patient had a PCN reaction causing severe rash involving mucus membranes or skin necrosis: Yes Has patient had a PCN reaction that required hospitalization No Has patient had a PCN reaction occurring within the last 10 years: Yes If all of the above answers are "NO", then may proceed with Cephalosporin use.      Social History:  reports that she has been smoking Cigarettes.  She has a 25.00 pack-year smoking history. She has never used smokeless tobacco. She  reports that she does not drink alcohol or use drugs.   Family History: Family History  Problem Relation Age of Onset  . Hypertension Mother   . Heart failure Mother   . Congestive Heart Failure Mother   . COPD Mother   . Pernicious anemia Mother   . Cancer Mother     lung  . Hypertension Father   . CAD Father   . Heart attack Father   . Hypertension Sister   . Cancer Other   . Celiac disease Other      Physical Exam: Vitals:   11/17/15 1633 11/17/15 1634 11/17/15 1739 11/17/15 1749  BP:   112/65   Pulse:      Resp:      Temp:      TempSrc:      SpO2: (!) 89% 91% 95% 95%  Weight:      Height:        General: Appears somewhat anxious resting in bed. Discomfort with movement due to surgical incisions. Eyes:  PERRL, EOMI, normal lids, iris ENT:  grossly normal hearing, lips & tongue, mmm Neck:  no LAD, masses or thyromegaly Cardiovascular:  RRR, no m/r/g. No LE edema.  Respiratory: Few rhonchi and wheezes but significantly diminished breath sounds in the bases. Increased effort. Abdomen:  soft, ntnd, hypoactive bowel sounds Skin:  no rash or induration seen on limited exam, surgical dressings CDI Musculoskeletal:  grossly normal tone BUE/BLE, good ROM, no bony abnormality Psychiatric: grossly normal mood and affect, speech fluent and appropriate, AOx3 Neurologic: CN 2-12 grossly intact, moves all extremities in coordinated fashion, sensation intact  Data reviewed:  I have personally reviewed following labs and imaging studies Labs:  CBC:  Recent Labs Lab 11/14/15 1102  WBC 10.3  NEUTROABS 7.3  HGB 12.4  HCT 37.7  MCV 107.4*  PLT 420*    Basic Metabolic Panel:  Recent Labs Lab 11/14/15 1102  NA 139  K 4.0  CL 105  CO2 27  GLUCOSE 115*  BUN 13  CREATININE 0.84  CALCIUM 9.6   GFR Estimated Creatinine Clearance: 56.3 mL/min (by C-G formula based on SCr of 0.84 mg/dL). Liver Function Tests:  Recent Labs Lab 11/14/15 1102  AST 22  ALT 15    ALKPHOS 65  BILITOT 0.4  PROT 7.3  ALBUMIN 3.9   No results for input(s): LIPASE, AMYLASE in the last 168 hours. No results for input(s): AMMONIA in the last 168 hours. Coagulation profile  Recent Labs Lab 11/14/15 1102  INR 1.09    Cardiac Enzymes: No results for input(s): CKTOTAL, CKMB, CKMBINDEX, TROPONINI in the last 168 hours. BNP: Invalid input(s): POCBNP CBG: No results for input(s): GLUCAP in the last 168 hours. D-Dimer No results for input(s): DDIMER in the last 72 hours. Hgb A1c No results for input(s): HGBA1C in the last 72 hours. Lipid Profile No results for input(s): CHOL, HDL, LDLCALC, TRIG, CHOLHDL, LDLDIRECT in the last 72 hours. Thyroid function studies No results for input(s): TSH, T4TOTAL,  T3FREE, THYROIDAB in the last 72 hours.  Invalid input(s): FREET3 Anemia work up No results for input(s): VITAMINB12, FOLATE, FERRITIN, TIBC, IRON, RETICCTPCT in the last 72 hours. Urinalysis    Component Value Date/Time   COLORURINE YELLOW 11/14/2015 1101   APPEARANCEUR CLEAR 11/14/2015 1101   LABSPEC 1.013 11/14/2015 1101   PHURINE 5.0 11/14/2015 1101   GLUCOSEU NEGATIVE 11/14/2015 1101   HGBUR NEGATIVE 11/14/2015 1101   BILIRUBINUR NEGATIVE 11/14/2015 1101   [place] 11/14/2015 1101   PROTEINUR NEGATIVE 11/14/2015 1101   NITRITE NEGATIVE 11/14/2015 1101   [place] 11/14/2015 1101     Microbiology Recent Results (from the past 240 hour(s))  Surgical pcr screen     Status: None   Collection Time: 11/14/15 11:01 AM  Result Value Ref Range Status   MRSA, PCR NEGATIVE NEGATIVE Final   Staphylococcus aureus NEGATIVE NEGATIVE Final    Comment:        The Xpert SA Assay (FDA approved for NASAL specimens in patients over 74 years of age), is one component of a comprehensive surveillance program.  Test performance has been validated by [name] for patients greater than or equal to 57 year old. It is not intended to diagnose  infection nor to guide or monitor treatment.        Inpatient Medications:   Scheduled Meds: . calcium-vitamin D  1 tablet Oral BID WC  . cholecalciferol  1,000 Units Oral Daily  . diazepam      . diltiazem  240 mg Oral Daily  . docusate sodium  100 mg Oral BID  . gabapentin  300 mg Oral QHS  . hydrochlorothiazide  12.5 mg Oral Daily  . HYDROmorphone      . hydroxyurea  1,000 mg Oral Once per day on Sun Wed Thu Fri Sat  . [START ON 11/21/2015] hydroxyurea  500 mg Oral Once per day on Mon Tue  . irbesartan  150 mg Oral Daily  . isosorbide mononitrate  60 mg Oral Daily  . oxyCODONE-acetaminophen      . pantoprazole  40 mg Oral Daily  . potassium chloride SA  40 mEq Oral Daily  . rosuvastatin  40 mg Oral q1800  . sodium chloride flush  3 mL Intravenous Q12H  . vancomycin  1,000 mg Intravenous Once   Continuous Infusions: . sodium chloride 5 mL/hr at 11/17/15 1615  . sodium chloride       Radiological Exams on Admission: Dg Lumbar Spine 2-3 Views  Result Date: 11/17/2015 CLINICAL DATA:  Status post lumbar fusion. EXAM: DG C-ARM GT 120 MIN; LUMBAR SPINE - 2-3 VIEW FLUOROSCOPY TIME:  Radiation Exposure Index (as provided by the fluoroscopic device): If the device does not provide the exposure index: Fluoroscopy Time (in minutes and seconds):  3 minutes 4 seconds Number of Acquired Images:  2 COMPARISON:  08/24/2015 FINDINGS: Two images from portable C-arm radiography obtained in the operating room show hardware fixation with placement of posterior screws and interbody spacers extending from L2 through L5. IMPRESSION: 1. Status post lumbar fusion of L2 through L5. Electronically Signed   By: Kerby Moors M.D.   On: 11/17/2015 10:54   Dg Lumbar Spine 2-3 Views  Result Date: 11/16/2015 CLINICAL DATA:  Lumbar fusion EXAM: DG C-ARM GT 120 MIN; LUMBAR SPINE - 2-3 VIEW COMPARISON:  08/24/2015 FLUOROSCOPY TIME:  Radiation Exposure Index (as provided by the fluoroscopic device): Not  available If the device does not provide the exposure index: Fluoroscopy Time:  3  minutes 24 seconds Number of Acquired Images:  3 FINDINGS: Interbody fusion is noted at L2-3, L3-4 and L4-5. Changes of prior fusion at L5-S1 are noted. IMPRESSION: Lumbar fusion from L2-L5. Electronically Signed   By: Inez Catalina M.D.   On: 11/16/2015 13:26   Dg Chest Port 1 View  Result Date: 11/17/2015 CLINICAL DATA:  Hypoxia EXAM: PORTABLE CHEST 1 VIEW COMPARISON:  11/14/2015 FINDINGS: Cardiomediastinal silhouette is normal. Mediastinal contours appear intact. There is no evidence of focal airspace consolidation, pleural effusion or pneumothorax. Upper lobe predominant emphysematous changes with chronic interstitial lung thickening noted. Linear opacity overlying the mid left lung, likely corresponds to a previously demonstrated by CT perifissural lung nodule. Osseous structures are without acute abnormality. Soft tissues are grossly normal. IMPRESSION: Emphysematous changes and chronic interstitial thickening, without evidence of acute airspace consolidation. Known left pulmonary nodule. Electronically Signed   By: Fidela Salisbury M.D.   On: 11/17/2015 16:52   Dg C-arm Gt 120 Min  Result Date: 11/17/2015 CLINICAL DATA:  Status post lumbar fusion. EXAM: DG C-ARM GT 120 MIN; LUMBAR SPINE - 2-3 VIEW FLUOROSCOPY TIME:  Radiation Exposure Index (as provided by the fluoroscopic device): If the device does not provide the exposure index: Fluoroscopy Time (in minutes and seconds):  3 minutes 4 seconds Number of Acquired Images:  2 COMPARISON:  08/24/2015 FINDINGS: Two images from portable C-arm radiography obtained in the operating room show hardware fixation with placement of posterior screws and interbody spacers extending from L2 through L5. IMPRESSION: 1. Status post lumbar fusion of L2 through L5. Electronically Signed   By: Kerby Moors M.D.   On: 11/17/2015 10:54   Dg C-arm Gt 120 Min  Result Date:  11/16/2015 CLINICAL DATA:  Lumbar fusion EXAM: DG C-ARM GT 120 MIN; LUMBAR SPINE - 2-3 VIEW COMPARISON:  08/24/2015 FLUOROSCOPY TIME:  Radiation Exposure Index (as provided by the fluoroscopic device): Not available If the device does not provide the exposure index: Fluoroscopy Time:  3 minutes 24 seconds Number of Acquired Images:  3 FINDINGS: Interbody fusion is noted at L2-3, L3-4 and L4-5. Changes of prior fusion at L5-S1 are noted. IMPRESSION: Lumbar fusion from L2-L5. Electronically Signed   By: Inez Catalina M.D.   On: 11/16/2015 13:26    Impression/Recommendations Active Problems:   Radiculopathy   Acute respiratory failure (HCC)   COPD (chronic obstructive pulmonary disease) (HCC)   CHF exacerbation (HCC)   Hypotension   Acute respiratory failure: Likely secondary to new onset CHF with underlying undiagnosed COPD. Patient with 40-50-pack-year history of smoking but is not on O2 at home and does not have a pulmonologist. Per my review of patient's 2 most recent chest x-rays dated 11/14/2015 and at time of my evaluation on 11/17/2015 there is marked to onset cardiomegaly with increasing pulmonary vascular congestion. This correlates with patient receiving a minimum of 4.3 L of IV fluids over the last 2 days due to surgery on subsequent days. There are only faint intermittent wheezes I do not suspect that a COPD exacerbation is at play. No evidence of specific consolidation suggestive of pneumonia as patient is also afebrile, without a cough without fever. Patient responding well to increasing nasal cannula. Patient is on 40 mg of Lasix at home daily for what she says is "leg swelling " and denies any history of CHF. - ABG - Continue O2 - Lasix IV 40 mg twice a day (favor more aggressive diuresis and blood pressure can be maintained) - Stop IVF (currently  running at 75 mL per hour),  - Stat echo - Duo nebs when necessary - Repeat CXR in am - Strict I's and O's, daily weights  Hypertension:  currently running hypotensive likely secondary to surgeries, acute CHF and narcotics from surgery. - Hold antihypertensives until April 2017 in a.m. - Monitor closely given aggressive diuresis - Conservative management of pain given likely narcotic-induced hypotension (no NSAIDs per primary team due to possibility of poor wound healing postoperatively.)   Thank you for this consultation.  Our [facility] hospitalist team will follow the patient with you.   Yamina Lenis J M.D. [facility] 11/17/2015, 5:53 PM

## 2015-11-17 NOTE — Progress Notes (Signed)
Heard back from [facility] team, spoke with his OR staff. rn instructed to call charge anesthesia. Called and explained situation. At this time do not think rapid response needs to be call. Pt not receiving any narcotics. To call back to anesthesia if pt gets worse or any change.

## 2015-11-17 NOTE — Progress Notes (Signed)
Pt has regularly been doing incentive spirometer. Instructed family to have pt to incentive spirometer every 5 minutes. rn has witness, pt on first attempt can get to 900, second attempt 7650, 3rd attempt 500.

## 2015-11-17 NOTE — Anesthesia Procedure Notes (Addendum)
Procedure Name: Intubation Date/Time: 11/17/2015 7:35 AM Performed by: Jacquiline Doe A Pre-anesthesia Checklist: Patient identified, Emergency Drugs available, Suction available and Patient being monitored Patient Re-evaluated:Patient Re-evaluated prior to inductionOxygen Delivery Method: Circle System Utilized Preoxygenation: Pre-oxygenation with 100% oxygen Intubation Type: IV induction and Cricoid Pressure applied Ventilation: Mask ventilation without difficulty Laryngoscope Size: Mac and 4 Grade View: Grade II Tube type: Oral Tube size: 7.0 mm Number of attempts: 2 Airway Equipment and Method: Stylet and Oral airway Placement Confirmation: ETT inserted through vocal cords under direct vision,  positive ETCO2 and breath sounds checked- equal and bilateral Secured at: 22 cm Tube secured with: Tape Dental Injury: Teeth and Oropharynx as per pre-operative assessment

## 2015-11-17 NOTE — Anesthesia Postprocedure Evaluation (Signed)
Anesthesia Post Note  Patient: Ashley [name]  Procedure(s) Performed: Procedure(s) (LRB): POSTERIOR LUMBAR FUSION, LUMBAR 2-3, LUMBAR 3-4, LUMBAR 4-5 WITH INSTRUMENTATION AND ALLOGRAFT (N/A)  Patient location during evaluation: PACU Anesthesia Type: General Level of consciousness: awake and alert Pain management: pain level controlled Vital Signs Assessment: post-procedure vital signs reviewed and stable Respiratory status: spontaneous breathing, nonlabored ventilation, respiratory function stable and patient connected to nasal cannula oxygen Cardiovascular status: blood pressure returned to baseline and stable Postop Assessment: no signs of nausea or vomiting Anesthetic complications: no    Last Vitals:  Vitals:   11/17/15 0540 11/17/15 1055  BP: (!) 109/58   Pulse: 64   Resp: 18   Temp: 36.9 C 36.9 C    Last Pain:  Vitals:   11/17/15 1125  TempSrc:   PainSc: 6                  Zenaida Deed

## 2015-11-17 NOTE — Progress Notes (Signed)
Pt family requesting heat packs for pts legs because they are cramping. Given 2 heat packs.

## 2015-11-17 NOTE — Anesthesia Preprocedure Evaluation (Addendum)
Anesthesia Evaluation  Patient identified by MRN, date of birth, ID band Patient awake    Reviewed: Allergy & Precautions, NPO status , Patient's Chart, lab work & pertinent test results  Airway Mallampati: II  TM Distance: >3 FB Neck ROM: Full    Dental  (+) Teeth Intact   Pulmonary COPD, Current Smoker,    Pulmonary exam normal breath sounds clear to auscultation       Cardiovascular hypertension, + CAD and + Peripheral Vascular Disease   Rhythm:Regular Rate:Normal     Neuro/Psych    GI/Hepatic GERD  Medicated and Controlled,  Endo/Other    Renal/GU      Musculoskeletal  (+) Arthritis ,   Abdominal   Peds  Hematology   Anesthesia Other Findings   Reproductive/Obstetrics                            Anesthesia Physical  Anesthesia Plan  ASA: III  Anesthesia Plan: General   Post-op Pain Management:    Induction: Intravenous  Airway Management Planned: Oral ETT  Additional Equipment:   Intra-op Plan:   Post-operative Plan: Extubation in OR  Informed Consent: I have reviewed the patients History and Physical, chart, labs and discussed the procedure including the risks, benefits and alternatives for the proposed anesthesia with the patient or authorized representative who has indicated his/her understanding and acceptance.   Dental advisory given  Plan Discussed with: CRNA, Anesthesiologist and Surgeon  Anesthesia Plan Comments:         Anesthesia Quick Evaluation

## 2015-11-17 NOTE — H&P (Signed)
Patient presents for stage 2 of her 2-staged procedure today. Will proceed as planned.

## 2015-11-17 NOTE — Progress Notes (Signed)
Pt having some hand tremors. Family reports this is normal after surgery for pt.

## 2015-11-17 NOTE — Progress Notes (Signed)
Pt asking for food. rn allowing pt to get applesauce at this time. Will see how pt tolerates this before ordering diet.

## 2015-11-17 NOTE — Progress Notes (Signed)
  Echocardiogram 2D Echocardiogram has been performed.  Ashley [name] 11/17/2015, 5:42 PM

## 2015-11-17 NOTE — Progress Notes (Signed)
md office called, message about pts breathing episode will be passed along to his team.

## 2015-11-17 NOTE — Progress Notes (Signed)
Patient presenting with O2 sats high 80's to low 90's on 4L [place]; bilateral breath sounds slightly rhonchus; encourage coughing and deep breaths. Anesthesia MD notified of status. Orders received and implemented.

## 2015-11-17 NOTE — Progress Notes (Addendum)
Pt BP decreasing. Since lasix administration pt has had 1085 urine output in less than 2 hours.   md paged. Waiting for call back

## 2015-11-18 ENCOUNTER — Encounter (HOSPITAL_COMMUNITY): Payer: Self-pay | Admitting: Orthopedic Surgery

## 2015-11-18 ENCOUNTER — Inpatient Hospital Stay (HOSPITAL_COMMUNITY): Payer: Medicare Other

## 2015-11-18 DIAGNOSIS — J9601 Acute respiratory failure with hypoxia: Secondary | ICD-10-CM

## 2015-11-18 DIAGNOSIS — J449 Chronic obstructive pulmonary disease, unspecified: Secondary | ICD-10-CM

## 2015-11-18 DIAGNOSIS — E876 Hypokalemia: Secondary | ICD-10-CM

## 2015-11-18 LAB — BASIC METABOLIC PANEL
Anion gap: 6 (ref 5–15)
BUN: 5 mg/dL — AB (ref 6–20)
CHLORIDE: 99 mmol/L — AB (ref 101–111)
CO2: 28 mmol/L (ref 22–32)
Calcium: 8.5 mg/dL — ABNORMAL LOW (ref 8.9–10.3)
Creatinine, Ser: 0.7 mg/dL (ref 0.44–1.00)
GFR calc Af Amer: >60 mL/min (ref >60)
GFR calc non Af Amer: >60 mL/min (ref >60)
GLUCOSE: 108 mg/dL — AB (ref 65–99)
POTASSIUM: 3.1 mmol/L — AB (ref 3.5–5.1)
SODIUM: 133 mmol/L — AB (ref 135–145)

## 2015-11-18 MED ORDER — METHOCARBAMOL 500 MG PO TABS: 500.0000 mg | ORAL_TABLET | Freq: Four times a day (QID) | ORAL | 0 refills | Status: DC | PRN

## 2015-11-18 MED ORDER — FUROSEMIDE 10 MG/ML IJ SOLN
40.0000 mg | Freq: Once | INTRAMUSCULAR | Status: AC
  Administered 2015-11-18: 40 mg via INTRAVENOUS
  Filled 2015-11-18: qty 4

## 2015-11-18 MED ORDER — METHOCARBAMOL 500 MG PO TABS
500.0000 mg | ORAL_TABLET | Freq: Four times a day (QID) | ORAL | Status: DC | PRN
  Administered 2015-11-18 – 2015-11-19 (×3): 500 mg via ORAL
  Filled 2015-11-18 (×3): qty 1

## 2015-11-18 MED ORDER — METHOCARBAMOL 1000 MG/10ML IJ SOLN: 500.0000 mg | Freq: Four times a day (QID) | INTRAVENOUS | Status: DC | PRN

## 2015-11-18 MED FILL — Thrombin For Soln 20000 Unit: CUTANEOUS | Qty: 1 | Status: AC

## 2015-11-18 NOTE — Progress Notes (Signed)
PROGRESS NOTE    Ashley [name]  IDP:[id number] DOB: 1950-06-11 DOA: 11/16/2015 PCP: [name], NP   Brief Narrative: Hypoxic respiratory failure due to volume overload, 65 yo female sp staged lateral/ posterior fusion L2-5. Clinically improved after diuresis. Chronic tobacco abuse.  Assessment & Plan:   Active Problems:   Radiculopathy   Acute respiratory failure (HCC)   COPD (chronic obstructive pulmonary disease) (HCC)   CHF exacerbation (HCC)   Hypotension   Elective surgery   1. Acute pulmonary edema due to volume overload. Follow chest film personally reviewed noted improved pulmonary edema, but still persistent infiltrates, patient on 4 LPM flow nasal cannula, will repeat dose of furosemide 40 mg. Patient had about 3 L urine output yesterday. Will continue to monitor oxymetry and supplemental 02 per [place] to keep 02 sat above 92%  2. COPD. Patient with extensive hx of tobacco abuse, will continue bronchodilator therapy, oxymetry monitor and supplemental 02, keep negative fluid balance.  3. CHF. Echocardiogram with preserved LV systolic function, no signs of diastolic dysfunction. Pulmonary edema possible more due to volume overload than true cardiogenic. RV with preserved systolic function.   4. HTN. Blood pressure systolic 88 to 92, will hold antihypertensive agents, will continue to monitor blood pressure.   5. HypoKalemia. Due to diuresis with loop diuretic. Will replete k with po kcl, follow rena panel in am, avoid hypotension or nephrotoxic medications.   6. Metabolic encephalopathy. Clinically improved, will continue pain control with oxycodone/ tylenol combination.   DVT prophylaxis:  scd Code Status:  FULL Family Communication:  I spoke with patient's family at the bedside and all questions were addressed, key information obtained for patient's care. Disposition Plan:    Consultants:     Procedures: L2-L5 fusion  Antimicrobials:    Subjective: Patient's  dyspnea has improved with diuresis no chest pain, has back pain, moderate to severe in intensity, no radiation, worse with movement, no improving factors, no associated nausea or vomiting.  Objective: Vitals:   11/17/15 2100 11/18/15 0120 11/18/15 0555 11/18/15 0907  BP: (!) 105/53 (!) 107/48 (!) 103/59 103/60  Pulse: 84 82 91 90  Resp: '12 18 16 14  '$ Temp: (!) 100.4 F (38 C) 99.4 F (37.4 C) 99.1 F (37.3 C) 99.5 F (37.5 C)  TempSrc: Oral Oral Oral Oral  SpO2: 93% 93% 93% 97%  Weight:   69.4 kg (153 lb)   Height:        Intake/Output Summary (Last 24 hours) at 11/18/15 1231 Last data filed at 11/18/15 0100  Gross per 24 hour  Intake              150 ml  Output             2510 ml  Net            -2360 ml   Filed Weights   11/17/15 1443 11/17/15 1814 11/18/15 0555  Weight: 62.1 kg (137 lb) 68.9 kg (151 lb 12.8 oz) 69.4 kg (153 lb)    Examination:  General exam: deconditioned E ENT: mild conjunctival pallor, oral mucosa moist Respiratory system: scattered bilateral rhonchi, with no wheezing or rales.. Cardiovascular system: S1 & S2 heard, RRR. No JVD, murmurs, rubs, gallops or clicks. No pedal edema. Gastrointestinal system: Abdomen is nondistended, soft and nontender. No organomegaly or masses felt. Normal bowel sounds heard. Central nervous system: Alert and oriented. No focal neurological deficits. Extremities: Symmetric 5 x 5 power. Skin: No rashes, lesions or ulcers  Data Reviewed: I have personally reviewed following labs and imaging studies  CBC:  Recent Labs Lab 11/14/15 1102  WBC 10.3  NEUTROABS 7.3  HGB 12.4  HCT 37.7  MCV 107.4*  PLT 027*   Basic Metabolic Panel:  Recent Labs Lab 11/14/15 1102  NA 139  K 4.0  CL 105  CO2 27  GLUCOSE 115*  BUN 13  CREATININE 0.84  CALCIUM 9.6   GFR: Estimated Creatinine Clearance: 59.3 mL/min (by C-G formula based on SCr of 0.84 mg/dL). Liver Function Tests:  Recent Labs Lab 11/14/15 1102    AST 22  ALT 15  ALKPHOS 65  BILITOT 0.4  PROT 7.3  ALBUMIN 3.9   No results for input(s): LIPASE, AMYLASE in the last 168 hours. No results for input(s): AMMONIA in the last 168 hours. Coagulation Profile:  Recent Labs Lab 11/14/15 1102  INR 1.09   Cardiac Enzymes: No results for input(s): CKTOTAL, CKMB, CKMBINDEX, TROPONINI in the last 168 hours. BNP (last 3 results) No results for input(s): PROBNP in the last 8760 hours. HbA1C: No results for input(s): HGBA1C in the last 72 hours. CBG: No results for input(s): GLUCAP in the last 168 hours. Lipid Profile: No results for input(s): CHOL, HDL, LDLCALC, TRIG, CHOLHDL, LDLDIRECT in the last 72 hours. Thyroid Function Tests: No results for input(s): TSH, T4TOTAL, FREET4, T3FREE, THYROIDAB in the last 72 hours. Anemia Panel: No results for input(s): VITAMINB12, FOLATE, FERRITIN, TIBC, IRON, RETICCTPCT in the last 72 hours. Sepsis Labs: No results for input(s): PROCALCITON, LATICACIDVEN in the last 168 hours.  Recent Results (from the past 240 hour(s))  Surgical pcr screen     Status: None   Collection Time: 11/14/15 11:01 AM  Result Value Ref Range Status   MRSA, PCR NEGATIVE NEGATIVE Final   Staphylococcus aureus NEGATIVE NEGATIVE Final    Comment:        The Xpert SA Assay (FDA approved for NASAL specimens in patients over 81 years of age), is one component of a comprehensive surveillance program.  Test performance has been validated by [facility] for patients greater than or equal to 5 year old. It is not intended to diagnose infection nor to guide or monitor treatment.          Radiology Studies: Dg Chest 2 View  Result Date: 11/18/2015 CLINICAL DATA:  Acute respiratory failure EXAM: CHEST  2 VIEW COMPARISON:  11/17/2015 FINDINGS: Cardiac enlargement. Mild vascular congestion. Prominent interstitial markings again noted. Bilateral pleural effusions are small. Findings may be due to heart failure and  interstitial edema. Given history breast cancer, lymphangitic spread of tumor is a possibility. Mild left lower lobe atelectasis. IMPRESSION: Vascular congestion with prominent interstitial markings and bilateral effusions. Probable fluid overload. Followup following diuresis suggestive as lymphangitic spread of tumor is in the differential. Electronically Signed   By: Franchot Gallo M.D.   On: 11/18/2015 08:45   Dg Lumbar Spine 2-3 Views  Result Date: 11/17/2015 CLINICAL DATA:  Status post lumbar fusion. EXAM: DG C-ARM GT 120 MIN; LUMBAR SPINE - 2-3 VIEW FLUOROSCOPY TIME:  Radiation Exposure Index (as provided by the fluoroscopic device): If the device does not provide the exposure index: Fluoroscopy Time (in minutes and seconds):  3 minutes 4 seconds Number of Acquired Images:  2 COMPARISON:  08/24/2015 FINDINGS: Two images from portable C-arm radiography obtained in the operating room show hardware fixation with placement of posterior screws and interbody spacers extending from L2 through L5. IMPRESSION: 1. Status post lumbar  fusion of L2 through L5. Electronically Signed   By: Kerby Moors M.D.   On: 11/17/2015 10:54   Dg Chest Port 1 View  Result Date: 11/17/2015 CLINICAL DATA:  Hypoxia EXAM: PORTABLE CHEST 1 VIEW COMPARISON:  11/14/2015 FINDINGS: Cardiomediastinal silhouette is normal. Mediastinal contours appear intact. There is no evidence of focal airspace consolidation, pleural effusion or pneumothorax. Upper lobe predominant emphysematous changes with chronic interstitial lung thickening noted. Linear opacity overlying the mid left lung, likely corresponds to a previously demonstrated by CT perifissural lung nodule. Osseous structures are without acute abnormality. Soft tissues are grossly normal. IMPRESSION: Emphysematous changes and chronic interstitial thickening, without evidence of acute airspace consolidation. Known left pulmonary nodule. Electronically Signed   By: Fidela Salisbury M.D.    On: 11/17/2015 16:52   Dg C-arm Gt 120 Min  Result Date: 11/17/2015 CLINICAL DATA:  Status post lumbar fusion. EXAM: DG C-ARM GT 120 MIN; LUMBAR SPINE - 2-3 VIEW FLUOROSCOPY TIME:  Radiation Exposure Index (as provided by the fluoroscopic device): If the device does not provide the exposure index: Fluoroscopy Time (in minutes and seconds):  3 minutes 4 seconds Number of Acquired Images:  2 COMPARISON:  08/24/2015 FINDINGS: Two images from portable C-arm radiography obtained in the operating room show hardware fixation with placement of posterior screws and interbody spacers extending from L2 through L5. IMPRESSION: 1. Status post lumbar fusion of L2 through L5. Electronically Signed   By: Kerby Moors M.D.   On: 11/17/2015 10:54        Scheduled Meds: . calcium-vitamin D  1 tablet Oral BID WC  . cholecalciferol  1,000 Units Oral Daily  . diltiazem  240 mg Oral Daily  . docusate sodium  100 mg Oral BID  . gabapentin  300 mg Oral QHS  . hydrochlorothiazide  12.5 mg Oral Daily  . hydroxyurea  1,000 mg Oral Once per day on Sun Wed Thu Fri Sat  . [START ON 11/21/2015] hydroxyurea  500 mg Oral Once per day on Mon Tue  . irbesartan  150 mg Oral Daily  . isosorbide mononitrate  60 mg Oral Daily  . pantoprazole  40 mg Oral Daily  . potassium chloride SA  40 mEq Oral Daily  . rosuvastatin  40 mg Oral q1800  . sodium chloride flush  3 mL Intravenous Q12H   Continuous Infusions: . sodium chloride       LOS: 2 days       Zayden Hahne Gerome Apley, MD [facility] Pager [phone]  If 7PM-7AM, please contact night-coverage [email or web address] Password [facility] 11/18/2015, 12:31 PM

## 2015-11-18 NOTE — Evaluation (Signed)
Occupational Therapy Evaluation Patient Details Name: Ashley [name] MRN: [id number] DOB: May 06, 1950 Today's Date: 11/18/2015    History of Present Illness pt presents after 2 part surgery with 11/15/15 underwent L sided ALIF L2-5 and on 11/16/15 underwent PLIF L2-5.  pt with Respiratory Difficulties Post-op and per notes was felt to be new diagnosis of CHF and COPD.  pt with hx of Scoliosis, Breast CA s/p Mastectomy, AAA, CAD, and HTN.     Clinical Impression   Pt reports she was independent with ADL PTA. Currently pt overall min assist for basic transfers and max assist for LB ADL and donning back brace. Educated on back precautions and provided handout. Pt planning to d/c home with 24/7 supervision from family. Recommending HHOT for follow up in order to maximize independence and safety with ADL and functional mobility upon return home. Pt would benefit from continued skilled OT to address established goals.    Follow Up Recommendations  Home health OT;Supervision/Assistance - 24 hour    Equipment Recommendations  3 in 1 bedside comode;Other (comment) (AE)    Recommendations for Other Services       Precautions / Restrictions Precautions Precautions: Fall;Back Precaution Booklet Issued: Yes (comment) Precaution Comments: Educated pt and daughter on back precautions. Required Braces or Orthoses: Spinal Brace Spinal Brace: Thoracolumbosacral orthotic;Applied in sitting position Restrictions Weight Bearing Restrictions: No      Mobility Bed Mobility Overal bed mobility: Needs Assistance Bed Mobility: Rolling;Sidelying to Sit Rolling: Min guard Sidelying to sit: Mod assist       General bed mobility comments: cues for log roll technique and sequencing bed mobility.  pt with good follow-through on cues.  Needs A for bringing trunk up to sitting.    Transfers Overall transfer level: Needs assistance Equipment used: Rolling walker (2 wheeled) Transfers: Sit to/from [employer] Sit to Stand: Min assist Stand pivot transfers: Min assist       General transfer comment: VCs for for hand placement and upright posture in standing. Pt reports feeling "wobbly" in standing with some knee buckling noted.    Balance Overall balance assessment: Needs assistance Sitting-balance support: Bilateral upper extremity supported;Feet supported Sitting balance-Leahy Scale: Poor Sitting balance - Comments: Tends to use UEs more for pain relief than balance.     Standing balance support: Bilateral upper extremity supported;During functional activity Standing balance-Leahy Scale: Poor Standing balance comment: RW for support                            ADL Overall ADL's : Needs assistance/impaired Eating/Feeding: Independent;Sitting   Grooming: Min guard;Set up;Sitting   Upper Body Bathing: Min guard;Sitting   Lower Body Bathing: Maximal assistance;Sit to/from stand   Upper Body Dressing : Maximal assistance;Sitting Upper Body Dressing Details (indicate cue type and reason): for donning brace Lower Body Dressing: Maximal assistance;Sit to/from stand   Toilet Transfer: Minimal assistance;Stand-pivot;BSC;RW   Toileting- Clothing Manipulation and Hygiene: Maximal assistance;Sit to/from stand       Functional mobility during ADLs: Minimal assistance;Rolling walker (for stand pivot only) General ADL Comments: Educated pt on back precautions during functional activities, log roll technique for bed mobility. Pt with dizziness upon standing; reports her legs feel weak and "wobbly". SpO2= 88-93% on 3 L O2 via nasal canula throughout activities during session.     Vision Additional Comments: Appears WFL.   Perception     Praxis      Pertinent Vitals/Pain Pain Assessment:  0-10 Pain Score: 7  Pain Location: back, LLE Pain Descriptors / Indicators: Aching;Grimacing;Guarding Pain Intervention(s): Monitored during session;Repositioned;Premedicated  before session     Hand Dominance     Extremity/Trunk Assessment Upper Extremity Assessment Upper Extremity Assessment: Overall WFL for tasks assessed   Lower Extremity Assessment Lower Extremity Assessment: Defer to PT evaluation LLE Deficits / Details: pt indicates chronic diminished sensation on lateral aspect of thigh.  Pain radiates down posterio-lateral of L LE, which pt indicates was occurring at baseline.   LLE Sensation: decreased light touch   Cervical / Trunk Assessment Cervical / Trunk Assessment: Other exceptions Cervical / Trunk Exceptions: s/p lumbar sx   Communication Communication Communication: No difficulties   Cognition Arousal/Alertness: Awake/alert Behavior During Therapy: WFL for tasks assessed/performed Overall Cognitive Status: Within Functional Limits for tasks assessed                     General Comments       Exercises       Shoulder Instructions      Home Living Family/patient expects to be discharged to:: Private residence Living Arrangements: Children Available Help at Discharge: Family;Available 24 hours/day Type of Home: House Home Access: Stairs to enter [employer] of Steps: 1 and 2 Entrance Stairs-Rails: None Home Layout: Multi-level Alternate Level Stairs-Number of Steps: 3 Alternate Level Stairs-Rails: None Bathroom Shower/Tub: Tub/shower unit;Walk-in shower   Bathroom Toilet: Standard     Home Equipment: None          Prior Functioning/Environment Level of Independence: Independent             OT Diagnosis: Generalized weakness;Acute pain   OT Problem List: Decreased strength;Decreased activity tolerance;Impaired balance (sitting and/or standing);Decreased knowledge of use of DME or AE;Decreased knowledge of precautions;Impaired sensation;Pain   OT Treatment/Interventions: Self-care/ADL training;Energy conservation;DME and/or AE instruction;Therapeutic activities;Patient/family  education;Balance training    OT Goals(Current goals can be found in the care plan section) Acute Rehab OT Goals Patient Stated Goal: Home OT Goal Formulation: With patient/family Time For Goal Achievement: 12/02/15 Potential to Achieve Goals: Good ADL Goals Pt Will Perform Lower Body Bathing: with supervision;sit to/from stand;with adaptive equipment Pt Will Perform Lower Body Dressing: with supervision;sit to/from stand;with adaptive equipment Pt Will Transfer to Toilet: with supervision;ambulating;bedside commode Pt Will Perform Toileting - Clothing Manipulation and hygiene: with supervision;sit to/from stand (with or without AE) Pt Will Perform Tub/Shower Transfer: Shower transfer;with supervision;ambulating;3 in 1;rolling walker Additional ADL Goal #1: Pt will independently verbally recall 3/3 back precautions and maintain throughout ADL.  Additional ADL Goal #2: Pt/caregiver will don/doff back brace with set up as precursor for ADL and functional mobility.  OT Frequency: Min 2X/week   Barriers to D/C:            Co-evaluation PT/OT/SLP Co-Evaluation/Treatment: Yes Reason for Co-Treatment: For patient/therapist safety PT goals addressed during session: Mobility/safety with mobility;Balance;Proper use of DME OT goals addressed during session: ADL's and self-care;Other (comment) (functional mobility)      End of Session Equipment Utilized During Treatment: Gait belt;Rolling walker;Back brace;Oxygen  Activity Tolerance: Patient limited by pain Patient left: in chair;with call bell/phone within reach;with chair alarm set;with family/visitor present   Time: 0920-0950 OT Time Calculation (min): 30 min Charges:  OT General Charges $OT Visit: 1 Procedure OT Evaluation $OT Eval Moderate Complexity: 1 Procedure G-Codes:     Binnie Kand M.S., OTR/L Pager: [phone]  11/18/2015, 10:45 AM

## 2015-11-18 NOTE — Evaluation (Signed)
Physical Therapy Evaluation Patient Details Name: Ashley [name] MRN: [id number] DOB: 05-13-1950 Today's Date: 11/18/2015   History of Present Illness  pt presents after 2 part surgery with 11/15/15 underwent L sided ALIF L2-5 and on 11/16/15 underwent PLIF L2-5.  pt with Respiratory Difficulties Post-op and per notes was felt to be new diagnosis of CHF and COPD.  pt with hx of Scoliosis, Breast CA s/p Mastectomy, AAA, CAD, and HTN.    Clinical Impression  Pt very painful with mobility and needs cues throughout for safe technique and back precautions.  Pt on 3L )2 throughout session with O2 sats remaining 88-93%.  At this time pt will need continued therapies to maximize independence and decrease overall burden of care prior to returning to home with daughter's support.      Follow Up Recommendations Home health PT;Supervision/Assistance - 24 hour    Equipment Recommendations  Rolling walker with 5" wheels;3in1 (PT)    Recommendations for Other Services       Precautions / Restrictions Precautions Precautions: Fall;Back Precaution Booklet Issued: Yes (comment) Precaution Comments: Reviewed Precautions with pt and one daughter. Required Braces or Orthoses: Spinal Brace Spinal Brace: Thoracolumbosacral orthotic;Applied in sitting position Restrictions Weight Bearing Restrictions: No      Mobility  Bed Mobility Overal bed mobility: Needs Assistance Bed Mobility: Rolling;Sidelying to Sit Rolling: Min guard Sidelying to sit: Mod assist       General bed mobility comments: cues for log roll technique and sequencing bed mobility.  pt with good follow-through on cues.  Needs A for bringing trunk up to sitting.    Transfers Overall transfer level: Needs assistance Equipment used: Rolling walker (2 wheeled) Transfers: Sit to/from [employer] Sit to Stand: Min assist Stand pivot transfers: Min assist       General transfer comment: pt needs cues for UE use and more  upright posture once in standing.  Cues for movement through SPT and use of RW.  pt's L knee begins to buckle a little during movement, but able to maintain standing with use of UEs and RW.  pt does indicate feeling "off" in standing with O2 sats remaining 88-93% on 3L O2.    Ambulation/Gait                Stairs            Wheelchair Mobility    Modified Rankin (Stroke Patients Only)       Balance Overall balance assessment: Needs assistance Sitting-balance support: Bilateral upper extremity supported;Feet supported Sitting balance-Leahy Scale: Poor Sitting balance - Comments: Tends to use UEs more for pain relief than balance.     Standing balance support: Bilateral upper extremity supported;During functional activity Standing balance-Leahy Scale: Poor                               Pertinent Vitals/Pain Pain Assessment: 0-10 Pain Score: 7  Pain Location: Back and L LE Pain Descriptors / Indicators: Aching;Grimacing;Guarding;Sharp Pain Intervention(s): Monitored during session;Premedicated before session;Repositioned    Home Living Family/patient expects to be discharged to:: Private residence Living Arrangements: Children (One daughter lives with pt and the other will helping.) Available Help at Discharge: Family;Available 24 hours/day Type of Home: House Home Access: Stairs to enter Entrance Stairs-Rails: None Entrance Stairs-Number of Steps: 1 and 2 Home Layout: Multi-level Home Equipment: None      Prior Function Level of Independence: Independent  Hand Dominance        Extremity/Trunk Assessment   Upper Extremity Assessment: Defer to OT evaluation           Lower Extremity Assessment: Generalized weakness;LLE deficits/detail   LLE Deficits / Details: pt indicates chronic diminished sensation on lateral aspect of thigh.  Pain radiates down posterio-lateral of L LE, which pt indicates was occurring at  baseline.       Communication   Communication: No difficulties  Cognition Arousal/Alertness: Awake/alert Behavior During Therapy: WFL for tasks assessed/performed Overall Cognitive Status: Within Functional Limits for tasks assessed                      General Comments      Exercises        Assessment/Plan    PT Assessment Patient needs continued PT services  PT Diagnosis Difficulty walking;Acute pain;Generalized weakness   PT Problem List Decreased strength;Decreased activity tolerance;Decreased balance;Decreased mobility;Decreased knowledge of use of DME;Decreased knowledge of precautions;Cardiopulmonary status limiting activity;Impaired sensation;Pain  PT Treatment Interventions DME instruction;Gait training;Stair training;Functional mobility training;Therapeutic activities;Therapeutic exercise;Balance training;Neuromuscular re-education;Patient/family education   PT Goals (Current goals can be found in the Care Plan section) Acute Rehab PT Goals Patient Stated Goal: Home PT Goal Formulation: With patient Time For Goal Achievement: 12/02/15 Potential to Achieve Goals: Good    Frequency Min 5X/week   Barriers to discharge        Co-evaluation PT/OT/SLP Co-Evaluation/Treatment: Yes Reason for Co-Treatment: For patient/therapist safety PT goals addressed during session: Mobility/safety with mobility;Balance;Proper use of DME         End of Session Equipment Utilized During Treatment: Gait belt;Back brace;Oxygen Activity Tolerance: Patient limited by fatigue;Patient limited by pain Patient left: in chair;with call bell/phone within reach;with chair alarm set;with family/visitor present Nurse Communication: Mobility status         Time: 2458-0998 PT Time Calculation (min) (ACUTE ONLY): 29 min   Charges:   PT Evaluation $PT Eval Moderate Complexity: 1 Procedure     PT G CodesCatarina Hartshorn, [place] 11/18/2015, 10:09 AM

## 2015-11-18 NOTE — Progress Notes (Signed)
OT Cancellation Note  Patient Details Name: Ashley [name] MRN: [id number] DOB: April 20, 1950   Cancelled Treatment:    Reason Eval/Treat Not Completed: Patient at procedure or test/ unavailable. Will follow up for OT eval as time allows.  Binnie Kand M.S., OTR/L Pager: [phone]  11/18/2015, 8:19 AM

## 2015-11-18 NOTE — Progress Notes (Signed)
    Patient doing well, feeling much better today after diuresis, has not been up walking yet, reports back pain and aching in legs. Taking tramadol and Tylenol only currently.    Physical Exam: Vitals:   11/18/15 0120 11/18/15 0555  BP: (!) 107/48 (!) 103/59  Pulse: 82 91  Resp: 18 16  Temp: 99.4 F (37.4 C) 99.1 F (37.3 C)    Dressing in place, CDI, in bed resting comfortably  NVI  POD #2/1 s/p Staged Lateral/Posterior fusion L2-5  - Encouraged ambulation  - up with PT/OT, encourage ambulation  Brace on when OOB - Tramadol for pain, Robaxin for muscle spasms, monitor BP - likely d/c home once cleared by PT and medicine service today or tomorrow

## 2015-11-19 ENCOUNTER — Inpatient Hospital Stay (HOSPITAL_COMMUNITY): Payer: Medicare Other

## 2015-11-19 DIAGNOSIS — I952 Hypotension due to drugs: Secondary | ICD-10-CM

## 2015-11-19 LAB — CBC WITH DIFFERENTIAL/PLATELET
BASOS ABS: 0 10*3/uL (ref 0.0–0.1)
BASOS PCT: 0 %
EOS ABS: 0.4 10*3/uL (ref 0.0–0.7)
Eosinophils Relative: 3 %
HEMATOCRIT: 29.5 % — AB (ref 36.0–46.0)
HEMOGLOBIN: 9.8 g/dL — AB (ref 12.0–15.0)
Lymphocytes Relative: 12 %
Lymphs Abs: 1.3 10*3/uL (ref 0.7–4.0)
MCH: 34.8 pg — ABNORMAL HIGH (ref 26.0–34.0)
MCHC: 33.2 g/dL (ref 30.0–36.0)
MCV: 104.6 fL — ABNORMAL HIGH (ref 78.0–100.0)
MONOS PCT: 8 %
Monocytes Absolute: 0.8 10*3/uL (ref 0.1–1.0)
NEUTROS ABS: 8.1 10*3/uL — AB (ref 1.7–7.7)
NEUTROS PCT: 77 %
Platelets: 268 10*3/uL (ref 150–400)
RBC: 2.82 MIL/uL — AB (ref 3.87–5.11)
RDW: 14.7 % (ref 11.5–15.5)
WBC: 10.5 10*3/uL (ref 4.0–10.5)

## 2015-11-19 LAB — BASIC METABOLIC PANEL
ANION GAP: 9 (ref 5–15)
BUN: 7 mg/dL (ref 6–20)
CALCIUM: 8.6 mg/dL — AB (ref 8.9–10.3)
CO2: 25 mmol/L (ref 22–32)
CREATININE: 0.77 mg/dL (ref 0.44–1.00)
Chloride: 99 mmol/L — ABNORMAL LOW (ref 101–111)
Glucose, Bld: 116 mg/dL — ABNORMAL HIGH (ref 65–99)
Potassium: 3.4 mmol/L — ABNORMAL LOW (ref 3.5–5.1)
SODIUM: 133 mmol/L — AB (ref 135–145)

## 2015-11-19 MED ORDER — METHOCARBAMOL 500 MG PO TABS: 500.0000 mg | ORAL_TABLET | Freq: Four times a day (QID) | ORAL | 0 refills | Status: DC | PRN

## 2015-11-19 MED ORDER — IOPAMIDOL (ISOVUE-370) INJECTION 76%
INTRAVENOUS | Status: AC
  Administered 2015-11-19: 100 mL
  Filled 2015-11-19: qty 100

## 2015-11-19 MED ORDER — SALINE SPRAY 0.65 % NA SOLN
1.0000 | NASAL | Status: DC | PRN
  Administered 2015-11-19: 1 via NASAL
  Filled 2015-11-19: qty 44

## 2015-11-19 NOTE — Progress Notes (Signed)
Physical Therapy Treatment Patient Details Name: Ashley [name] MRN: [id number] DOB: Sep 04, 1950 Today's Date: 11/19/2015    History of Present Illness pt presents after 2 part surgery with 11/15/15 underwent L sided ALIF L2-5 and on 11/16/15 underwent PLIF L2-5.  pt with Respiratory Difficulties Post-op and per notes was felt to be new diagnosis of CHF and COPD.  pt with hx of Scoliosis, Breast CA s/p Mastectomy, AAA, CAD, and HTN.      PT Comments    Patient is progressing toward mobility goals. Tolerated gait training this session. Continue to progress as tolerated with anticipated d/c home with HHPT.   Follow Up Recommendations  Home health PT;Supervision/Assistance - 24 hour     Equipment Recommendations  Rolling walker with 5" wheels;3in1 (PT)    Recommendations for Other Services       Precautions / Restrictions Precautions Precautions: Fall;Back Precaution Booklet Issued: Yes (comment) Precaution Comments: reviewed precautions Required Braces or Orthoses: Spinal Brace Spinal Brace: Thoracolumbosacral orthotic;Applied in sitting position Restrictions Weight Bearing Restrictions: No    Mobility  Bed Mobility Overal bed mobility: Needs Assistance Bed Mobility: Rolling;Sidelying to Sit Rolling: Min guard Sidelying to sit: Min assist       General bed mobility comments: cues for sequencing/technique; assist to elevate trunk into sitting; increased time and use of rail  Transfers Overall transfer level: Needs assistance Equipment used: Rolling walker (2 wheeled) Transfers: Sit to/from Stand Sit to Stand: Min assist         General transfer comment: assist to power up into standing; cues for safe hand placement  Ambulation/Gait Ambulation/Gait assistance: Min assist Ambulation Distance (Feet): 100 Feet Assistive device: Rolling walker (2 wheeled) Gait Pattern/deviations: Step-through pattern;Decreased stride length;Trunk flexed;Narrow base of support     General  Gait Details: cues for posture, sequencing, and proximity of RW; assist for balance, weight shifting, and management of RW at times; pt with slow, grossly steady gait; shaking of all four extremities    Stairs            Wheelchair Mobility    Modified Rankin (Stroke Patients Only)       Balance     Sitting balance-Leahy Scale: Fair       Standing balance-Leahy Scale: Poor                      Cognition Arousal/Alertness: Awake/alert Behavior During Therapy: WFL for tasks assessed/performed Overall Cognitive Status: Within Functional Limits for tasks assessed                      Exercises      General Comments General comments (skin integrity, edema, etc.): 4L O2 via nasal canula throughout session; SpO2 92% post ambulation      Pertinent Vitals/Pain Pain Assessment: 0-10 Pain Score: 6  Pain Location: bilat LE but L>R Pain Descriptors / Indicators: Burning;Numbness;Radiating Pain Intervention(s): Limited activity within patient's tolerance;Monitored during session;Premedicated before session;Repositioned    Home Living                      Prior Function            PT Goals (current goals can now be found in the care plan section) Acute Rehab PT Goals Patient Stated Goal: Home PT Goal Formulation: With patient Time For Goal Achievement: 12/02/15 Potential to Achieve Goals: Good Progress towards PT goals: Progressing toward goals    Frequency  Min 5X/week  PT Plan Current plan remains appropriate    Co-evaluation             End of Session Equipment Utilized During Treatment: Gait belt;Back brace;Oxygen Activity Tolerance: Patient tolerated treatment well Patient left: in chair;with call bell/phone within reach;with chair alarm set;with family/visitor present     Time: 0802-2336 PT Time Calculation (min) (ACUTE ONLY): 25 min  Charges:  $Gait Training: 8-22 mins $Therapeutic Activity: 8-22 mins                     G Codes:      Ashley [name], PTA Pager: [phone]   11/19/2015, 10:18 AM

## 2015-11-19 NOTE — Progress Notes (Addendum)
PROGRESS NOTE    Ashley [name]  WUJ:[id number] DOB: [birth year] DOA: 11/16/2015 PCP: [name], NP    Brief Narrative:  Hypoxic respiratory failure due to volume overload, 65 yo female sp staged lateral/ posterior fusion L2-5. Clinically improved after diuresis. Chronic tobacco abuse.  Assessment & Plan:   Active Problems:   Radiculopathy   Acute respiratory failure (HCC)   COPD (chronic obstructive pulmonary disease) (HCC)   CHF exacerbation (HCC)   Hypotension   Elective surgery   1. Hypoxic respiratory failure. Acute pulmonary edema due to volume overload. Clinically improved, no further diuresis indicated. Oxymetry on room air down to the 80's will order CT chest with PE protocol.   2. COPD. Patient with extensive hx of tobacco abuse, may use albuterol inhaler as needed, smoking cessation and outpatinet PFT.  3. CHF ruled out. Echocardiogram normal and patient asymptomatic at home.   4. HTN. Will recommend to continue to hold antihypertensive agents, including irbersartan, hctz, isosorbide. Resume gradually once blood pressure greater than 140/90.    5. HypoKalemia. K at 3,4. From 3,1, will continue to replete K with KCL, will continue to hold on diuretics for now.   6. Metabolic encephalopathy. Clinically improved, will continue pain control with oxycodone/ tylenol combination.    DVT prophylaxis:  scd Code Status:  FULL Family Communication:  I spoke with patient's family at the bedside and all questions were addressed, key information obtained for patient's care. Disposition Plan: home   Consultants:     Procedures:  L2-L5 fusion  Antimicrobials:   Subjective: Patient with improved dyspnea, no chest pain, no nausea or vomiting. At home no chronic cough or wheezing. Positive tobacco abuse.   Objective: Vitals:   11/18/15 2155 11/19/15 0142 11/19/15 0543 11/19/15 0847  BP: (!) 92/54 (!) 94/50 108/67 (!) 91/46  Pulse: 70 69 85 73  Resp: '16 20 18  18  '$ Temp: 98.6 F (37 C) 98.8 F (37.1 C) 98.8 F (37.1 C) 97.9 F (36.6 C)  TempSrc: Oral Oral Oral Oral  SpO2: 96% 92% 100% 94%  Weight:      Height:        Intake/Output Summary (Last 24 hours) at 11/19/15 1037 Last data filed at 11/19/15 0737  Gross per 24 hour  Intake                0 ml  Output             1700 ml  Net            -1700 ml   Filed Weights   11/17/15 1443 11/17/15 1814 11/18/15 0555  Weight: 62.1 kg (137 lb) 68.9 kg (151 lb 12.8 oz) 69.4 kg (153 lb)    Examination:  General exam: Appears calm and comfortable, not in pain or dyspnea E ENT: mild conjunctival pallor, oral mucosa moist. Respiratory system: Clear to auscultation. Respiratory effort normal. No wheezing or rhonchi, mild rales at bases.  Cardiovascular system: S1 & S2 heard, RRR. No JVD, murmurs, rubs, gallops or clicks. No pedal edema. Gastrointestinal system: Abdomen is nondistended, soft and nontender. No organomegaly or masses felt. Normal bowel sounds heard. Central nervous system: Alert and oriented. No focal neurological deficits. Extremities: Symmetric 5 x 5 power. Skin: No rashes, lesions or ulcers Psychiatry: Judgement and insight appear normal. Mood & affect appropriate.     Data Reviewed: I have personally reviewed following labs and imaging studies  CBC:  Recent Labs Lab 11/14/15 1102 11/19/15 0758  WBC 10.3 10.5  NEUTROABS 7.3 8.1*  HGB 12.4 9.8*  HCT 37.7 29.5*  MCV 107.4* 104.6*  PLT 420* 341   Basic Metabolic Panel:  Recent Labs Lab 11/14/15 1102 11/18/15 1441 11/19/15 0758  NA 139 133* 133*  K 4.0 3.1* 3.4*  CL 105 99* 99*  CO2 '27 28 25  '$ GLUCOSE 115* 108* 116*  BUN 13 5* 7  CREATININE 0.84 0.70 0.77  CALCIUM 9.6 8.5* 8.6*   GFR: Estimated Creatinine Clearance: 62.3 mL/min (by C-G formula based on SCr of 0.8 mg/dL). Liver Function Tests:  Recent Labs Lab 11/14/15 1102  AST 22  ALT 15  ALKPHOS 65  BILITOT 0.4  PROT 7.3  ALBUMIN 3.9   No  results for input(s): LIPASE, AMYLASE in the last 168 hours. No results for input(s): AMMONIA in the last 168 hours. Coagulation Profile:  Recent Labs Lab 11/14/15 1102  INR 1.09   Cardiac Enzymes: No results for input(s): CKTOTAL, CKMB, CKMBINDEX, TROPONINI in the last 168 hours. BNP (last 3 results) No results for input(s): PROBNP in the last 8760 hours. HbA1C: No results for input(s): HGBA1C in the last 72 hours. CBG: No results for input(s): GLUCAP in the last 168 hours. Lipid Profile: No results for input(s): CHOL, HDL, LDLCALC, TRIG, CHOLHDL, LDLDIRECT in the last 72 hours. Thyroid Function Tests: No results for input(s): TSH, T4TOTAL, FREET4, T3FREE, THYROIDAB in the last 72 hours. Anemia Panel: No results for input(s): VITAMINB12, FOLATE, FERRITIN, TIBC, IRON, RETICCTPCT in the last 72 hours. Sepsis Labs: No results for input(s): PROCALCITON, LATICACIDVEN in the last 168 hours.  Recent Results (from the past 240 hour(s))  Surgical pcr screen     Status: None   Collection Time: 11/14/15 11:01 AM  Result Value Ref Range Status   MRSA, PCR NEGATIVE NEGATIVE Final   Staphylococcus aureus NEGATIVE NEGATIVE Final    Comment:        The Xpert SA Assay (FDA approved for NASAL specimens in patients over 60 years of age), is one component of a comprehensive surveillance program.  Test performance has been validated by [facility] for patients greater than or equal to 35 year old. It is not intended to diagnose infection nor to guide or monitor treatment.          Radiology Studies: Dg Chest 2 View  Result Date: 11/18/2015 CLINICAL DATA:  Acute respiratory failure EXAM: CHEST  2 VIEW COMPARISON:  11/17/2015 FINDINGS: Cardiac enlargement. Mild vascular congestion. Prominent interstitial markings again noted. Bilateral pleural effusions are small. Findings may be due to heart failure and interstitial edema. Given history breast cancer, lymphangitic spread of tumor is a  possibility. Mild left lower lobe atelectasis. IMPRESSION: Vascular congestion with prominent interstitial markings and bilateral effusions. Probable fluid overload. Followup following diuresis suggestive as lymphangitic spread of tumor is in the differential. Electronically Signed   By: Franchot Gallo M.D.   On: 11/18/2015 08:45   Dg Chest Port 1 View  Result Date: 11/17/2015 CLINICAL DATA:  Hypoxia EXAM: PORTABLE CHEST 1 VIEW COMPARISON:  11/14/2015 FINDINGS: Cardiomediastinal silhouette is normal. Mediastinal contours appear intact. There is no evidence of focal airspace consolidation, pleural effusion or pneumothorax. Upper lobe predominant emphysematous changes with chronic interstitial lung thickening noted. Linear opacity overlying the mid left lung, likely corresponds to a previously demonstrated by CT perifissural lung nodule. Osseous structures are without acute abnormality. Soft tissues are grossly normal. IMPRESSION: Emphysematous changes and chronic interstitial thickening, without evidence of acute airspace consolidation. Known  left pulmonary nodule. Electronically Signed   By: Fidela Salisbury M.D.   On: 11/17/2015 16:52        Scheduled Meds: . calcium-vitamin D  1 tablet Oral BID WC  . cholecalciferol  1,000 Units Oral Daily  . diltiazem  240 mg Oral Daily  . docusate sodium  100 mg Oral BID  . gabapentin  300 mg Oral QHS  . hydroxyurea  1,000 mg Oral Once per day on Sun Wed Thu Fri Sat  . [START ON 11/21/2015] hydroxyurea  500 mg Oral Once per day on Mon Tue  . pantoprazole  40 mg Oral Daily  . potassium chloride SA  40 mEq Oral Daily  . rosuvastatin  40 mg Oral q1800  . sodium chloride flush  3 mL Intravenous Q12H   Continuous Infusions: . sodium chloride       LOS: 3 days        Jermel Artley Gerome Apley, MD [facility] Pager [phone]  If 7PM-7AM, please contact night-coverage [email or web address] Password [facility] 11/19/2015, 10:37 AM

## 2015-11-19 NOTE — Progress Notes (Signed)
Subjective: 2 Days Post-Op Procedure(s) (LRB): POSTERIOR LUMBAR FUSION, LUMBAR 2-3, LUMBAR 3-4, LUMBAR 4-5 WITH INSTRUMENTATION AND ALLOGRAFT (N/A)   Patient resting comfortably in bed this morning. She has no new weakness or significant radicular symptoms in her legs. they believe that her pulse ox is broken and are requesting a new one.   Activity level:  wbat Diet tolerance:  ok Voiding:  ok Patient reports pain as mild.    Objective: Vital signs in last 24 hours: Temp:  [98 F (36.7 C)-99.5 F (37.5 C)] 98.8 F (37.1 C) (08/05 0543) Pulse Rate:  [64-90] 85 (08/05 0543) Resp:  [14-20] 18 (08/05 0543) BP: (87-108)/(48-67) 108/67 (08/05 0543) SpO2:  [92 %-100 %] 100 % (08/05 0543)  Labs: No results for input(s): HGB in the last 72 hours. No results for input(s): WBC, RBC, HCT, PLT in the last 72 hours.  Recent Labs  11/18/15 1441  NA 133*  K 3.1*  CL 99*  CO2 28  BUN 5*  CREATININE 0.70  GLUCOSE 108*  CALCIUM 8.5*   No results for input(s): LABPT, INR in the last 72 hours.  Physical Exam:  Neurologically intact ABD soft Neurovascular intact Sensation intact distally Intact pulses distally Dorsiflexion/Plantar flexion intact Incision: dressing C/D/I No cellulitis present Compartment soft  Assessment/Plan:  2 Days Post-Op Procedure(s) (LRB): POSTERIOR LUMBAR FUSION, LUMBAR 2-3, LUMBAR 3-4, LUMBAR 4-5 WITH INSTRUMENTATION AND ALLOGRAFT (N/A) Encouraged ambulation  - up with PT/OT, encourage ambulation                       Brace on when OOB - Percocet for pain as long as breathing is not affected, Robaxin for muscle spasms, monitor BP - likely d/c home once cleared by PT and medicine service today or tomorrow   Ashley [name], Ashley [name] 11/19/2015, 8:29 AM

## 2015-11-19 NOTE — Progress Notes (Signed)
OT Cancellation Note  Patient Details Name: YAMINAH CLAYBORN MRN: [id number] DOB: 06/21/1950   Cancelled Treatment:    Reason Eval/Treat Not Completed: Patient declined, no reason specified. Pt reported that she had just gotten back to bed and declined to work with therapy at this time. Will check back tomorrow if time allows.  Redmond Baseman, OTR/L Pager: [phone] 11/19/2015, 4:31 PM

## 2015-11-19 NOTE — Progress Notes (Signed)
    Patient doing well Patient looks good and reports minimal pain in back and minimal L thigh discomfort Has been ambulating   Physical Exam: Vitals:   11/19/15 0543 11/19/15 0847  BP: 108/67 (!) 91/46  Pulse: 85 73  Resp: 18 18  Temp: 98.8 F (37.1 C) 97.9 F (36.6 C)    Dressings in place NVI  Pt s/p A/P L2-L5 fusion, doing well with improved vitals and improved respiratory status. I appreciate medicine's input. 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   - up with PT/OT, encourage ambulation - continue current pain meds - CT chest per Dr. Cathlean Sauer to r/o PE, if normal, patient can be discharged home tomorrow with f/u in 2 weeks

## 2015-11-20 DIAGNOSIS — J690 Pneumonitis due to inhalation of food and vomit: Secondary | ICD-10-CM

## 2015-11-20 LAB — BASIC METABOLIC PANEL
Anion gap: 11 (ref 5–15)
BUN: 6 mg/dL (ref 6–20)
CO2: 21 mmol/L — ABNORMAL LOW (ref 22–32)
CREATININE: 0.66 mg/dL (ref 0.44–1.00)
Calcium: 7.8 mg/dL — ABNORMAL LOW (ref 8.9–10.3)
Chloride: 98 mmol/L — ABNORMAL LOW (ref 101–111)
Glucose, Bld: 94 mg/dL (ref 65–99)
POTASSIUM: 3.3 mmol/L — AB (ref 3.5–5.1)
SODIUM: 130 mmol/L — AB (ref 135–145)

## 2015-11-20 MED ORDER — IPRATROPIUM-ALBUTEROL 0.5-2.5 (3) MG/3ML IN SOLN: 3.0000 mL | RESPIRATORY_TRACT | Status: DC | PRN

## 2015-11-20 MED ORDER — NITROGLYCERIN 0.4 MG SL SUBL: 0.4000 mg | SUBLINGUAL_TABLET | SUBLINGUAL | Status: DC | PRN

## 2015-11-20 MED ORDER — LEVOFLOXACIN IN D5W 750 MG/150ML IV SOLN
750.0000 mg | INTRAVENOUS | Status: DC
  Administered 2015-11-20: 750 mg via INTRAVENOUS
  Filled 2015-11-20 (×2): qty 150

## 2015-11-20 MED ORDER — IPRATROPIUM-ALBUTEROL 0.5-2.5 (3) MG/3ML IN SOLN
3.0000 mL | Freq: Four times a day (QID) | RESPIRATORY_TRACT | Status: DC
  Administered 2015-11-20: 3 mL via RESPIRATORY_TRACT
  Filled 2015-11-20: qty 3

## 2015-11-20 MED ORDER — IPRATROPIUM-ALBUTEROL 0.5-2.5 (3) MG/3ML IN SOLN
3.0000 mL | Freq: Three times a day (TID) | RESPIRATORY_TRACT | Status: DC
  Administered 2015-11-20 – 2015-11-21 (×2): 3 mL via RESPIRATORY_TRACT
  Filled 2015-11-20 (×2): qty 3

## 2015-11-20 MED ORDER — POLYETHYLENE GLYCOL 3350 17 G PO PACK
17.0000 g | PACK | Freq: Two times a day (BID) | ORAL | Status: DC
Start: 2015-11-20 — End: 2015-11-21
  Administered 2015-11-20 – 2015-11-21 (×3): 17 g via ORAL
  Filled 2015-11-20 (×3): qty 1

## 2015-11-20 NOTE — Progress Notes (Signed)
PROGRESS NOTE    Ashley [name]  VOJ:[id number] DOB: Aug 01, 1950 DOA: 11/16/2015 PCP: [name], NP   Brief Narrative: Hypoxic respiratory failure due to volume overload, 65 yo female sp staged lateral/ posterior fusion L2-5. Clinically improved after diuresis. Chronic tobacco abuse.  Assessment & Plan:   Active Problems:   Radiculopathy   Acute respiratory failure (HCC)   COPD (chronic obstructive pulmonary disease) (HCC)   CHF exacerbation (HCC)   Hypotension   Elective surgery   1. Hypoxic respiratory failure. Acute pulmonary edema due to volume overload. Clinically improved, no further diuresis indicated. Oxymetry on room air down to the 80's. CT chest with no PE, personally reviewed lung windows, noted infiltrates at bases, considering cough and recent surgery, will treat for aspiration pneumonia. Patient allergic to penicillin, will start levofloxacin IV, and will add scheduled bronchodilator therapy with duoneb q 6 hours and as needed q 2 hours. Continue incentive spirometer.   2. COPD. Patient with extensive hx of tobacco abuse, smoking cessation and outpatinet PFT.  3. CHF. Ruled out. Echocardiogram normal and patient asymptomatic at home. No further clinical signs of hypervolemia, will continue to hold on furosemide. Will d.c foley catheter  4. HTN. Improved with systolic up to 993, will continue to hold on antihypertensive medications, will continue diltiazem and will add nitroglycerin as needed, patient off isosorbide.   5. HypoKalemia. Will check BMP in am. Patient off furosemide.   6. Metabolic encephalopathy. Clinically improved, will continue pain control with oxycodone/ tylenol combination. Out of bed as tolerated.    Consultants:     Procedures:  L2-L5 fusion  Antimicrobials: Levofloxacin #0  Subjective: Patient with persistent stable dyspnea, moderate in intensity, associated with cough, no improving or worsening factors, no nausea or vomiting.  Tolerating po well, no chest pain.   Objective: Vitals:   11/20/15 0144 11/20/15 0512 11/20/15 0558 11/20/15 0847  BP: (!) 96/59 126/87  108/79  Pulse: 74 97  87  Resp: '18 20  18  '$ Temp: 99 F (37.2 C) 100 F (37.8 C)  98.6 F (37 C)  TempSrc: Oral Oral  Oral  SpO2: 92% 93%  95%  Weight:   73.3 kg (161 lb 8 oz)   Height:        Intake/Output Summary (Last 24 hours) at 11/20/15 1427 Last data filed at 11/20/15 0545  Gross per 24 hour  Intake                0 ml  Output             1600 ml  Net            -1600 ml   Filed Weights   11/17/15 1814 11/18/15 0555 11/20/15 0558  Weight: 68.9 kg (151 lb 12.8 oz) 69.4 kg (153 lb) 73.3 kg (161 lb 8 oz)    Examination:  General exam: deconditioned E ENT: mild conjunctival pallor, oral mucosa dry. Respiratory system: positive bibasilar rales with scattered rhonchi, no significant wheezing. Cardiovascular system: S1 & S2 heard, RRR. No JVD, murmurs, rubs, gallops or clicks. No pedal edema. Gastrointestinal system: Abdomen is nondistended, soft and nontender. No organomegaly or masses felt. Normal bowel sounds heard. Central nervous system: Alert and oriented. No focal neurological deficits. Extremities: Symmetric 5 x 5 power. Skin: No rashes, lesions or ulcers    Data Reviewed: I have personally reviewed following labs and imaging studies  CBC:  Recent Labs Lab 11/14/15 1102 11/19/15 0758  WBC 10.3 10.5  NEUTROABS 7.3 8.1*  HGB 12.4 9.8*  HCT 37.7 29.5*  MCV 107.4* 104.6*  PLT 420* 073   Basic Metabolic Panel:  Recent Labs Lab 11/14/15 1102 11/18/15 1441 11/19/15 0758  NA 139 133* 133*  K 4.0 3.1* 3.4*  CL 105 99* 99*  CO2 '27 28 25  '$ GLUCOSE 115* 108* 116*  BUN 13 5* 7  CREATININE 0.84 0.70 0.77  CALCIUM 9.6 8.5* 8.6*   GFR: Estimated Creatinine Clearance: 64 mL/min (by C-G formula based on SCr of 0.8 mg/dL). Liver Function Tests:  Recent Labs Lab 11/14/15 1102  AST 22  ALT 15  ALKPHOS 65  BILITOT  0.4  PROT 7.3  ALBUMIN 3.9   No results for input(s): LIPASE, AMYLASE in the last 168 hours. No results for input(s): AMMONIA in the last 168 hours. Coagulation Profile:  Recent Labs Lab 11/14/15 1102  INR 1.09   Cardiac Enzymes: No results for input(s): CKTOTAL, CKMB, CKMBINDEX, TROPONINI in the last 168 hours. BNP (last 3 results) No results for input(s): PROBNP in the last 8760 hours. HbA1C: No results for input(s): HGBA1C in the last 72 hours. CBG: No results for input(s): GLUCAP in the last 168 hours. Lipid Profile: No results for input(s): CHOL, HDL, LDLCALC, TRIG, CHOLHDL, LDLDIRECT in the last 72 hours. Thyroid Function Tests: No results for input(s): TSH, T4TOTAL, FREET4, T3FREE, THYROIDAB in the last 72 hours. Anemia Panel: No results for input(s): VITAMINB12, FOLATE, FERRITIN, TIBC, IRON, RETICCTPCT in the last 72 hours. Sepsis Labs: No results for input(s): PROCALCITON, LATICACIDVEN in the last 168 hours.  Recent Results (from the past 240 hour(s))  Surgical pcr screen     Status: None   Collection Time: 11/14/15 11:01 AM  Result Value Ref Range Status   MRSA, PCR NEGATIVE NEGATIVE Final   Staphylococcus aureus NEGATIVE NEGATIVE Final    Comment:        The Xpert SA Assay (FDA approved for NASAL specimens in patients over 50 years of age), is one component of a comprehensive surveillance program.  Test performance has been validated by [facility] for patients greater than or equal to 33 year old. It is not intended to diagnose infection nor to guide or monitor treatment.          Radiology Studies: Ct Angio Chest Pe W Or Wo Contrast  Result Date: 11/19/2015 CLINICAL DATA:  Hypoxemia. Sudden onset of chest pain and shortness of breath. Recent lumbar spine surgery. EXAM: CT ANGIOGRAPHY CHEST WITH CONTRAST TECHNIQUE: Multidetector CT imaging of the chest was performed using the standard protocol during bolus administration of intravenous contrast.  Multiplanar CT image reconstructions and MIPs were obtained to evaluate the vascular anatomy. CONTRAST:  100 cc Isovue IV COMPARISON:  Radiographs 11/18/2015.  Chest CT 01/10/2015 FINDINGS: Cardiovascular: There are no filling defects within the pulmonary arteries to suggest pulmonary embolus. Atherosclerosis of the thoracic aorta without dissection. Unchanged ascending thoracic aortic aneurysm measuring 4.2 x 4.0 cm. Coronary artery calcifications are seen. Mediastinum/Nodes: There is no mediastinal or hilar adenopathy. No pericardial effusion. Lungs/Pleura: Small to moderate bilateral pleural effusions. Adjacent atelectasis in the lower lobes. There is smooth septal thickening superimposed on emphysema, likely pulmonary edema. The bilateral small subcentimeter pulmonary nodules are grossly unchanged, partially obscured by atelectasis. There is probable debris in the left mainstem bronchus. Upper Abdomen: Calcified granuloma in the liver. The right adrenal mass on prior CT is not included in the field of view. Left breast prosthesis is noted. Musculoskeletal: There are no  acute or suspicious osseous abnormalities. Coarsened trabecula within T6 is likely a hemangioma and unchanged from prior CT dating back to 2013. Review of the MIP images confirms the above findings. IMPRESSION: 1. No pulmonary embolus. 2. Bilateral pleural effusions. Smooth septal thickening this likely pulmonary edema. Findings suggest fluid overload. 3. Debris within the left mainstem bronchus, may be mucous plugging or aspiration. 4. Unchanged atherosclerosis and ascending aortic aneurysmal dilatation. No evidence of dissection. Coronary artery calcifications. Electronically Signed   By: Jeb Levering M.D.   On: 11/19/2015 20:09        Scheduled Meds: . calcium-vitamin D  1 tablet Oral BID WC  . cholecalciferol  1,000 Units Oral Daily  . diltiazem  240 mg Oral Daily  . docusate sodium  100 mg Oral BID  . gabapentin  300 mg Oral  QHS  . hydroxyurea  1,000 mg Oral Once per day on Sun Wed Thu Fri Sat  . [START ON 11/21/2015] hydroxyurea  500 mg Oral Once per day on Mon Tue  . levofloxacin (LEVAQUIN) IV  750 mg Intravenous Q24H  . pantoprazole  40 mg Oral Daily  . polyethylene glycol  17 g Oral BID  . potassium chloride SA  40 mEq Oral Daily  . rosuvastatin  40 mg Oral q1800  . sodium chloride flush  3 mL Intravenous Q12H   Continuous Infusions: . sodium chloride       LOS: 4 days       Neely Kammerer Gerome Apley, MD [facility] Pager [phone]  If 7PM-7AM, please contact night-coverage [email or web address] Password [email or web address] 11/20/2015, 2:27 PM

## 2015-11-20 NOTE — Progress Notes (Addendum)
Subjective: 3 Days Post-Op Procedure(s) (LRB): POSTERIOR LUMBAR FUSION, LUMBAR 2-3, LUMBAR 3-4, LUMBAR 4-5 WITH INSTRUMENTATION AND ALLOGRAFT (N/A)  Patient is standing up in room with daughter. She states that she is feeling much better and that medicine team states that she will likely go home today. Her chest CT was negative for PE yesterday.  Activity level:  wbat Diet tolerance:  ok Voiding:  ok Patient reports pain as mild.    Objective: Vital signs in last 24 hours: Temp:  [98.4 F (36.9 C)-100 F (37.8 C)] 98.6 F (37 C) (08/06 0847) Pulse Rate:  [73-97] 87 (08/06 0847) Resp:  [18-20] 18 (08/06 0847) BP: (96-126)/(57-88) 108/79 (08/06 0847) SpO2:  [83 %-95 %] 95 % (08/06 0847) Weight:  [73.3 kg (161 lb 8 oz)] 73.3 kg (161 lb 8 oz) (08/06 0558)  Labs:  Recent Labs  11/19/15 0758  HGB 9.8*    Recent Labs  11/19/15 0758  WBC 10.5  RBC 2.82*  HCT 29.5*  PLT 268    Recent Labs  11/18/15 1441 11/19/15 0758  NA 133* 133*  K 3.1* 3.4*  CL 99* 99*  CO2 28 25  BUN 5* 7  CREATININE 0.70 0.77  GLUCOSE 108* 116*  CALCIUM 8.5* 8.6*   No results for input(s): LABPT, INR in the last 72 hours.  Physical Exam:  Neurologically intact ABD soft Neurovascular intact Sensation intact distally Intact pulses distally Dorsiflexion/Plantar flexion intact Incision: dressing C/D/I and no drainage No cellulitis present Compartment soft  Assessment/Plan:  3 Days Post-Op Procedure(s) (LRB): POSTERIOR LUMBAR FUSION, LUMBAR 2-3, LUMBAR 3-4, LUMBAR 4-5 WITH INSTRUMENTATION AND ALLOGRAFT (N/A)  Encouraged ambulation  - up with PT/OT, encourage ambulation Brace on when OOB - Percocetfor pain as long as breathing is not affected, Robaxin for muscle spasms, monitor BP - Likely d/c home once cleared by PT and medicine service today or tomorrow. - We greatly appreciated medicine teams input.  Selvin Yun, Larwance Sachs 11/20/2015, 9:15 AM

## 2015-11-20 NOTE — Progress Notes (Signed)
Occupational Therapy Treatment/Discharge Patient Details Name: Ashley [name] MRN: [id number] DOB: 1950/06/07 Today's Date: 11/20/2015    History of present illness pt presents after 2 part surgery with 11/15/15 underwent L sided ALIF L2-5 and on 11/16/15 underwent PLIF L2-5.  pt with Respiratory Difficulties Post-op and per notes was felt to be new diagnosis of CHF and COPD.  pt with hx of Scoliosis, Breast CA s/p Mastectomy, AAA, CAD, and HTN.     OT comments  Pt progressing well and has achieved all acute OT goals. Pt able to complete basic transfers at supervision level and LB ADLs with min assist which her daughter can provide at home. Educated pt on use of long-handled sponge for bathing and a reacher for LB dressing. SpO2 on 2L [place] 96% at rest; dropped to 80% on RA during activity; returned to 96% on 2L [place] after 3 minutes sitting in chair. All education has been completed and pt has no further questions. Pt with no further acute OT needs. OT signing off.   Follow Up Recommendations  Home health OT;Supervision/Assistance - 24 hour    Equipment Recommendations  3 in 1 bedside comode    Recommendations for Other Services      Precautions / Restrictions Precautions Precautions: Fall;Back Precaution Booklet Issued: Yes (comment) Precaution Comments: Pt able to recall 3/3 back precautions Required Braces or Orthoses: Spinal Brace Spinal Brace: Thoracolumbosacral orthotic;Applied in sitting position Restrictions Weight Bearing Restrictions: No       Mobility Bed Mobility Overal bed mobility: Needs Assistance Bed Mobility: Rolling;Sidelying to Sit Rolling: Min assist Sidelying to sit: Min assist       General bed mobility comments: NT, pt up in chair and did not want to return to bed  Transfers Overall transfer level: Needs assistance Equipment used: Rolling walker (2 wheeled) Transfers: Sit to/from Stand Sit to Stand: Supervision         General transfer comment: no physical  assist required today     Balance Overall balance assessment: Needs assistance Sitting-balance support: No upper extremity supported;Feet supported Sitting balance-Leahy Scale: Fair     Standing balance support: Bilateral upper extremity supported;During functional activity Standing balance-Leahy Scale: Poor Standing balance comment: Uses RW for support                   ADL Overall ADL's : Needs assistance/impaired     Grooming: Supervision/safety;Standing   Upper Body Bathing: Supervision/ safety;Sitting   Lower Body Bathing: Minimal assistance;Sit to/from stand;With caregiver independent assisting Lower Body Bathing Details (indicate cue type and reason): has long-handled sponge Upper Body Dressing : Supervision/safety;Sitting   Lower Body Dressing: Minimal assistance;With caregiver independent assisting;Sit to/from stand Lower Body Dressing Details (indicate cue type and reason): will purchase reacher Toilet Transfer: Supervision/safety;Ambulation;BSC;RW   Toileting- Clothing Manipulation and Hygiene: Supervision/safety;Sit to/from stand   Tub/ Shower Transfer: Walk-in shower;Supervision/safety;Cueing for sequencing;Ambulation;Rolling walker Tub/Shower Transfer Details (indicate cue type and reason): cues for step sequence with RW Functional mobility during ADLs: Supervision/safety;Rolling walker        Vision                     Perception     Praxis      Cognition   Behavior During Therapy: [facility] for tasks assessed/performed Overall Cognitive Status: Within Functional Limits for tasks assessed       Memory: Decreased recall of precautions               Extremity/Trunk Assessment  Exercises     Shoulder Instructions       General Comments      Pertinent Vitals/ Pain       Pain Assessment: 0-10 Pain Score: 3  Pain Location: back Pain Descriptors / Indicators: Aching;Sore Pain Intervention(s): Limited  activity within patient's tolerance;Monitored during session  Home Living                                          Prior Functioning/Environment              Frequency       Progress Toward Goals  OT Goals(current goals can now be found in the care plan section)  Progress towards OT goals: Goals met/education completed, patient discharged from OT  Acute Rehab OT Goals Patient Stated Goal: Home OT Goal Formulation: With patient/family Time For Goal Achievement: 12/02/15 Potential to Achieve Goals: Good ADL Goals Pt Will Perform Lower Body Bathing: with supervision;sit to/from stand;with adaptive equipment Pt Will Perform Lower Body Dressing: with supervision;sit to/from stand;with adaptive equipment Pt Will Transfer to Toilet: with supervision;ambulating;bedside commode Pt Will Perform Toileting - Clothing Manipulation and hygiene: with supervision;sit to/from stand Pt Will Perform Tub/Shower Transfer: Shower transfer;with supervision;ambulating;3 in 1;rolling walker Additional ADL Goal #1: Pt will independently verbally recall 3/3 back precautions and maintain throughout ADL.  Additional ADL Goal #2: Pt/caregiver will don/doff back brace with set up as precursor for ADL and functional mobility.  Plan All goals met and education completed, patient discharged from OT services    Co-evaluation                 End of Session Equipment Utilized During Treatment: Gait belt;Rolling walker   Activity Tolerance Patient tolerated treatment well   Patient Left in chair;with call bell/phone within reach;Other (comment) (with PT)   Nurse Communication Mobility status        Time: 1015-1040 OT Time Calculation (min): 25 min  Charges: OT General Charges $OT Visit: 1 Procedure OT Treatments $Self Care/Home Management : 23-37 mins  Redmond Baseman, OTR/L Pager: [phone] 11/20/2015, 2:04 PM

## 2015-11-20 NOTE — Progress Notes (Signed)
RT assessed patient and changed Duoneb frequency from Q6 to TID based on the patient's scores.

## 2015-11-20 NOTE — Progress Notes (Signed)
Physical Therapy Treatment Patient Details Name: Ashley [name] MRN: [id number] DOB: 09-20-1950 Today's Date: 11/20/2015    History of Present Illness pt presents after 2 part surgery with 11/15/15 underwent L sided ALIF L2-5 and on 11/16/15 underwent PLIF L2-5.  pt with Respiratory Difficulties Post-op and per notes was felt to be new diagnosis of CHF and COPD.  pt with hx of Scoliosis, Breast CA s/p Mastectomy, AAA, CAD, and HTN.      PT Comments    Pt mobility is improved.  I have answered all patient's question regarding PT and mobility.   I have encouraged the patient to gradually increase activity daily to tolerance.  Pt is hopeful for DC today with family to assist.    Follow Up Recommendations  Home health PT;Supervision for mobility/OOB     Equipment Recommendations  Rolling walker with 5" wheels;3in1 (PT)    Recommendations for Other Services       Precautions / Restrictions Precautions Precautions: Fall;Back Precaution Booklet Issued: Yes (comment) Precaution Comments: Pt able to recall 3/3 back precautions Required Braces or Orthoses: Spinal Brace Spinal Brace: Thoracolumbosacral orthotic;Applied in sitting position Restrictions Weight Bearing Restrictions: No    Mobility  Bed Mobility               General bed mobility comments: NT, pt up in chair and did not want to return to bed  Transfers Overall transfer level: Needs assistance Equipment used: Rolling walker (2 wheeled) Transfers: Sit to/from Stand Sit to Stand: Supervision         General transfer comment: no physical assist required today   Ambulation/Gait Ambulation/Gait assistance: Supervision Ambulation Distance (Feet): 5 Feet Assistive device: Rolling walker (2 wheeled) Gait Pattern/deviations: Step-to pattern;Decreased stride length     General Gait Details: Did not ambulate further as stair training was primary objective for session and did not want to over fatigue pt before potential DC as  she had OT right before PT session   Stairs Stairs: Yes Stairs assistance: Min assist Stair Management: Forwards;Step to pattern (Pt help to rails on 1 attempt and to PT on second attempt) Number of Stairs: 2 (performed twice) General stair comments: Pt abzle to verbalize technique after being instructed  Wheelchair Mobility    Modified Rankin (Stroke Patients Only)       Balance     Sitting balance-Leahy Scale: Fair       Standing balance-Leahy Scale: Poor Standing balance comment: Uses RW for support                    Cognition Arousal/Alertness: Awake/alert Behavior During Therapy: WFL for tasks assessed/performed Overall Cognitive Status: Within Functional Limits for tasks assessed       Memory: Decreased recall of precautions              Exercises      General Comments General comments (skin integrity, edema, etc.): 2 daughters present at end of session. used 2L 02 during session      Pertinent Vitals/Pain Pain Assessment: 0-10 Pain Score: 3  Pain Location: back Pain Descriptors / Indicators: Aching;Sore Pain Intervention(s): Limited activity within patient's tolerance;Monitored during session    Home Living                      Prior Function            PT Goals (current goals can now be found in the care plan section) Progress towards PT goals:  Progressing toward goals    Frequency  Min 5X/week    PT Plan Current plan remains appropriate    Co-evaluation             End of Session Equipment Utilized During Treatment: Gait belt;Back brace;Oxygen Activity Tolerance: Patient tolerated treatment well Patient left: in chair;with call bell/phone within reach;with family/visitor present     Time: 7948-0165 PT Time Calculation (min) (ACUTE ONLY): 35 min  Charges:  $Gait Training: 23-37 mins                    G Codes:      Melvern Banker 12-02-2015, 12:24 PM  Lavonia Dana, PT  [phone] 12/02/2015

## 2015-11-20 NOTE — Progress Notes (Signed)
Pharmacy Antibiotic Note  Ashley [name] is a 65 y.o. female admitted on 11/14/2015 with left leg pain. She is now s/p left sided lateral interbody fusion of L2-3, L3-4,L4-5. Pharmacy has been consulted for levofloxacin dosing for potential aspiration pneumonia.   S/p 2 days of vanc for surgical prophylaxis. CT c/f aspiration pneumonia per Dr. Cathlean Sauer. Will start empiric antibiotics. WBC 10.5 on 8/5, afebrile with Tmax overnight of 100F. RR wnl. BP wnl. Patient has severe penicillin allergy, so will start levofloxacin.   CT 8/5: Debris within left mainstem bronchus, may be mucous plugging or aspiration.   O2 sats dropped from 94 to 83 yesterday, and remain <96%.  Plan: Levofloxacin '750mg'$  IV q24h  Height: '5\' 2"'$  (157.5 cm) Weight: 161 lb 8 oz (73.3 kg) IBW/kg (Calculated) : 50.1  Temp (24hrs), Avg:98.8 F (37.1 C), Min:98.4 F (36.9 C), Max:100 F (37.8 C)   Recent Labs Lab 11/14/15 1102 11/18/15 1441 11/19/15 0758  WBC 10.3  --  10.5  CREATININE 0.84 0.70 0.77    Estimated Creatinine Clearance: 64 mL/min (by C-G formula based on SCr of 0.8 mg/dL).    Allergies  Allergen Reactions  . Penicillins Hives and Rash    Has patient had a PCN reaction causing immediate rash, facial/tongue/throat swelling, SOB or lightheadedness with hypotension: Yes Has patient had a PCN reaction causing severe rash involving mucus membranes or skin necrosis: Yes Has patient had a PCN reaction that required hospitalization No Has patient had a PCN reaction occurring within the last 10 years: Yes If all of the above answers are "NO", then may proceed with Cephalosporin use.     Antimicrobials this admission: Levofloxacin 8/6 >>   Microbiology results: 7/31 MRSA PCR: negative  Thank you for allowing pharmacy to be a part of this patient's care.  Dierdre Harness, Cain Sieve, PharmD Clinical Pharmacy Resident [phone] (Pager) 11/20/2015 10:49 AM

## 2015-11-21 DIAGNOSIS — I950 Idiopathic hypotension: Secondary | ICD-10-CM

## 2015-11-21 LAB — CBC WITH DIFFERENTIAL/PLATELET
BASOS ABS: 0 10*3/uL (ref 0.0–0.1)
Basophils Relative: 0 %
EOS ABS: 0.3 10*3/uL (ref 0.0–0.7)
EOS PCT: 6 %
HCT: 27.1 % — ABNORMAL LOW (ref 36.0–46.0)
Hemoglobin: 9 g/dL — ABNORMAL LOW (ref 12.0–15.0)
LYMPHS ABS: 1.2 10*3/uL (ref 0.7–4.0)
LYMPHS PCT: 21 %
MCH: 34.9 pg — AB (ref 26.0–34.0)
MCHC: 33.2 g/dL (ref 30.0–36.0)
MCV: 105 fL — AB (ref 78.0–100.0)
MONO ABS: 0.5 10*3/uL (ref 0.1–1.0)
Monocytes Relative: 10 %
Neutro Abs: 3.5 10*3/uL (ref 1.7–7.7)
Neutrophils Relative %: 63 %
PLATELETS: 337 10*3/uL (ref 150–400)
RBC: 2.58 MIL/uL — AB (ref 3.87–5.11)
RDW: 14.6 % (ref 11.5–15.5)
WBC: 5.5 10*3/uL (ref 4.0–10.5)

## 2015-11-21 LAB — BASIC METABOLIC PANEL
ANION GAP: 8 (ref 5–15)
BUN: 6 mg/dL (ref 6–20)
CO2: 25 mmol/L (ref 22–32)
Calcium: 8.4 mg/dL — ABNORMAL LOW (ref 8.9–10.3)
Chloride: 103 mmol/L (ref 101–111)
Creatinine, Ser: 0.73 mg/dL (ref 0.44–1.00)
GFR calc Af Amer: >60 mL/min (ref >60)
GLUCOSE: 93 mg/dL (ref 65–99)
POTASSIUM: 3.7 mmol/L (ref 3.5–5.1)
SODIUM: 136 mmol/L (ref 135–145)

## 2015-11-21 MED ORDER — FUROSEMIDE 40 MG PO TABS: 40.0000 mg | ORAL_TABLET | Freq: Every day | ORAL | 0 refills | Status: DC | PRN

## 2015-11-21 MED ORDER — LEVOFLOXACIN 500 MG PO TABS: 500.0000 mg | ORAL_TABLET | Freq: Every day | ORAL | 0 refills | Status: AC

## 2015-11-21 MED ORDER — POTASSIUM CHLORIDE CRYS ER 20 MEQ PO TBCR: 40.0000 meq | EXTENDED_RELEASE_TABLET | Freq: Every day | ORAL | 0 refills | Status: DC | PRN

## 2015-11-21 MED ORDER — IRBESARTAN-HYDROCHLOROTHIAZIDE 150-12.5 MG PO TABS: 1.0000 | ORAL_TABLET | Freq: Every day | ORAL | 0 refills | Status: DC

## 2015-11-21 MED ORDER — IPRATROPIUM-ALBUTEROL 0.5-2.5 (3) MG/3ML IN SOLN: 3.0000 mL | Freq: Four times a day (QID) | RESPIRATORY_TRACT | 0 refills | Status: AC | PRN

## 2015-11-21 NOTE — Care Management Note (Signed)
Case Management Note  Patient Details  Name: Ashley [name] MRN: [id number] Date of Birth: 1950-09-19  Subjective/Objective:                    Action/Plan: Pt discharging home with self care. Recommendations for [facility] PT/OT but not ordered per Md. CM spoke with the patient and she does not want these services. Pt with orders for walker, 3 in 1, tub bench and nebulizer machine. Pt's insurance will not cover a tub bench.She was made aware and will obtain a tub bench outside the hospital. Brenton Grills with [facility] notified of the other DME and he delivered it to the room. Bedside RN updated.    Expected Discharge Date:                  Expected Discharge Plan:  Home/Self Care  In-House Referral:     Discharge planning Services  CM Consult  Post Acute Care Choice:  Durable Medical Equipment Choice offered to:  Patient  DME Arranged:  3-N-1, Walker rolling, Nebulizer machine DME Agency:  [place]:   (pt refusing aide) [place]:     Status of Service:  Completed, signed off  If discussed at [place] of Stay Meetings, dates discussed:    Additional Comments:  Pollie Friar, RN 11/21/2015, 2:31 PM

## 2015-11-21 NOTE — Progress Notes (Signed)
Pt discharged with family to home. Discharge instructions given and all questions answered.

## 2015-11-21 NOTE — Progress Notes (Signed)
PROGRESS NOTE    Ashley [name]  HYW:[id number] DOB: [birth year] DOA: 11/16/2015 PCP: [name], NP    Brief Narrative: Hypoxic respiratory failure due to volume overload, 65 yo female sp staged lateral/ posterior fusion L2-5. Clinically improved after diuresis. Chronic tobacco abuse. Patient with persistent hypoxemia, due to aspiration pneumonia.  Tolerating well antibiotic therapy.   Assessment & Plan:   Active Problems:   Radiculopathy   Acute respiratory failure (HCC)   COPD (chronic obstructive pulmonary disease) (HCC)   CHF exacerbation (HCC)   Hypotension   Elective surgery  1. Hypoxic respiratory failure. Due to volume overload and aspiration pneumonia. Patient medically stable to be discharged home. Will plan to continue antibiotic therapy with levofloxacin for 7 more days. Will prescribe bronchodilator therapy with duoneb, will need home nebulizer machine. Will check ambulatory oxymetry before discharge, patient may need home 02. Continue incentive spirometer.  2. COPD. Patient with extensive hx of tobacco abuse, smoking cessation and outpatinet PFT.  3. CHF ( ruled out). No signs of LV dysfunction per echocardiography, will continue imdur and diltiazem per home regimen, for coronary spasms.   4. HTN. Blood pressure 485 to 462 systolic, will recommend to hold on irbersatan/ hctz as long blood pressure less than 140/90.  5. HypoKalemia. K at 3.7. Use Kcl supplements only when using furosemide. Loop diuretic change to only as needed.     DVT prophylaxis: Code Status:FULL Family Communication: Disposition Plan:home   Consultants:     Procedures:   L2-L5 fusion  Antimicrobials: Levofloxacin #2  Subjective: Patient feeling better, dyspnea improved, no nausea or vomiting, tolerating po well. Positive cough but no wheezing.   Objective: Vitals:   11/21/15 0500 11/21/15 0538 11/21/15 0809 11/21/15 0821  BP:  115/74  120/80  Pulse:  71    Resp:   20    Temp:  98.2 F (36.8 C)    TempSrc:  Oral    SpO2:  98% 96%   Weight: 70.4 kg (155 lb 4 oz)     Height:        Intake/Output Summary (Last 24 hours) at 11/21/15 1023 Last data filed at 11/21/15 0201  Gross per 24 hour  Intake              240 ml  Output              200 ml  Net               40 ml   Filed Weights   11/18/15 0555 11/20/15 0558 11/21/15 0500  Weight: 69.4 kg (153 lb) 73.3 kg (161 lb 8 oz) 70.4 kg (155 lb 4 oz)    Examination:  General exam: not in pain or dyspnea E ENT: mild conjunctival pallor, oral mucosa moist. Respiratory system:  Respiratory effort normal. Bibasilar rales more right than left, no wheezing, or significant rhonchi.  Cardiovascular system: S1 & S2 heard, RRR. No JVD, murmurs, rubs, gallops or clicks. No pedal edema. Gastrointestinal system: Abdomen is nondistended, soft and nontender. No organomegaly or masses felt. Normal bowel sounds heard. Central nervous system: Alert and oriented. No focal neurological deficits. Extremities: Symmetric 5 x 5 power. Skin: No rashes, lesions or ulcers Psychiatry: Judgement and insight appear normal. Mood & affect appropriate.     Data Reviewed: I have personally reviewed following labs and imaging studies  CBC:  Recent Labs Lab 11/14/15 1102 11/19/15 0758 11/21/15 0428  WBC 10.3 10.5 5.5  NEUTROABS 7.3 8.1* 3.5  HGB 12.4 9.8* 9.0*  HCT 37.7 29.5* 27.1*  MCV 107.4* 104.6* 105.0*  PLT 420* 268 151   Basic Metabolic Panel:  Recent Labs Lab 11/14/15 1102 11/18/15 1441 11/19/15 0758 11/20/15 1648 11/21/15 0428  NA 139 133* 133* 130* 136  K 4.0 3.1* 3.4* 3.3* 3.7  CL 105 99* 99* 98* 103  CO2 '27 28 25 '$ 21* 25  GLUCOSE 115* 108* 116* 94 93  BUN 13 5* '7 6 6  '$ CREATININE 0.84 0.70 0.77 0.66 0.73  CALCIUM 9.6 8.5* 8.6* 7.8* 8.4*   GFR: Estimated Creatinine Clearance: 62.7 mL/min (by C-G formula based on SCr of 0.8 mg/dL). Liver Function Tests:  Recent Labs Lab 11/14/15 1102  AST  22  ALT 15  ALKPHOS 65  BILITOT 0.4  PROT 7.3  ALBUMIN 3.9   No results for input(s): LIPASE, AMYLASE in the last 168 hours. No results for input(s): AMMONIA in the last 168 hours. Coagulation Profile:  Recent Labs Lab 11/14/15 1102  INR 1.09   Cardiac Enzymes: No results for input(s): CKTOTAL, CKMB, CKMBINDEX, TROPONINI in the last 168 hours. BNP (last 3 results) No results for input(s): PROBNP in the last 8760 hours. HbA1C: No results for input(s): HGBA1C in the last 72 hours. CBG: No results for input(s): GLUCAP in the last 168 hours. Lipid Profile: No results for input(s): CHOL, HDL, LDLCALC, TRIG, CHOLHDL, LDLDIRECT in the last 72 hours. Thyroid Function Tests: No results for input(s): TSH, T4TOTAL, FREET4, T3FREE, THYROIDAB in the last 72 hours. Anemia Panel: No results for input(s): VITAMINB12, FOLATE, FERRITIN, TIBC, IRON, RETICCTPCT in the last 72 hours. Sepsis Labs: No results for input(s): PROCALCITON, LATICACIDVEN in the last 168 hours.  Recent Results (from the past 240 hour(s))  Surgical pcr screen     Status: None   Collection Time: 11/14/15 11:01 AM  Result Value Ref Range Status   MRSA, PCR NEGATIVE NEGATIVE Final   Staphylococcus aureus NEGATIVE NEGATIVE Final    Comment:        The Xpert SA Assay (FDA approved for NASAL specimens in patients over 67 years of age), is one component of a comprehensive surveillance program.  Test performance has been validated by [facility] for patients greater than or equal to 77 year old. It is not intended to diagnose infection nor to guide or monitor treatment.          Radiology Studies: Ct Angio Chest Pe W Or Wo Contrast  Result Date: 11/19/2015 CLINICAL DATA:  Hypoxemia. Sudden onset of chest pain and shortness of breath. Recent lumbar spine surgery. EXAM: CT ANGIOGRAPHY CHEST WITH CONTRAST TECHNIQUE: Multidetector CT imaging of the chest was performed using the standard protocol during bolus  administration of intravenous contrast. Multiplanar CT image reconstructions and MIPs were obtained to evaluate the vascular anatomy. CONTRAST:  100 cc Isovue IV COMPARISON:  Radiographs 11/18/2015.  Chest CT 01/10/2015 FINDINGS: Cardiovascular: There are no filling defects within the pulmonary arteries to suggest pulmonary embolus. Atherosclerosis of the thoracic aorta without dissection. Unchanged ascending thoracic aortic aneurysm measuring 4.2 x 4.0 cm. Coronary artery calcifications are seen. Mediastinum/Nodes: There is no mediastinal or hilar adenopathy. No pericardial effusion. Lungs/Pleura: Small to moderate bilateral pleural effusions. Adjacent atelectasis in the lower lobes. There is smooth septal thickening superimposed on emphysema, likely pulmonary edema. The bilateral small subcentimeter pulmonary nodules are grossly unchanged, partially obscured by atelectasis. There is probable debris in the left mainstem bronchus. Upper Abdomen: Calcified granuloma in the liver. The right adrenal mass  on prior CT is not included in the field of view. Left breast prosthesis is noted. Musculoskeletal: There are no acute or suspicious osseous abnormalities. Coarsened trabecula within T6 is likely a hemangioma and unchanged from prior CT dating back to 2013. Review of the MIP images confirms the above findings. IMPRESSION: 1. No pulmonary embolus. 2. Bilateral pleural effusions. Smooth septal thickening this likely pulmonary edema. Findings suggest fluid overload. 3. Debris within the left mainstem bronchus, may be mucous plugging or aspiration. 4. Unchanged atherosclerosis and ascending aortic aneurysmal dilatation. No evidence of dissection. Coronary artery calcifications. Electronically Signed   By: Jeb Levering M.D.   On: 11/19/2015 20:09        Scheduled Meds: . calcium-vitamin D  1 tablet Oral BID WC  . cholecalciferol  1,000 Units Oral Daily  . diltiazem  240 mg Oral Daily  . docusate sodium  100  mg Oral BID  . gabapentin  300 mg Oral QHS  . hydroxyurea  1,000 mg Oral Once per day on Sun Wed Thu Fri Sat  . hydroxyurea  500 mg Oral Once per day on Mon Tue  . ipratropium-albuterol  3 mL Nebulization TID  . levofloxacin (LEVAQUIN) IV  750 mg Intravenous Q24H  . pantoprazole  40 mg Oral Daily  . polyethylene glycol  17 g Oral BID  . potassium chloride SA  40 mEq Oral Daily  . rosuvastatin  40 mg Oral q1800  . sodium chloride flush  3 mL Intravenous Q12H   Continuous Infusions: . sodium chloride       LOS: 5 days        Raahim Shartzer Gerome Apley, MD [facility] Pager [phone]  If 7PM-7AM, please contact night-coverage [email or web address] Password [email or web address] 11/21/2015, 10:23 AM

## 2015-11-21 NOTE — Care Management Important Message (Signed)
Important Message  Patient Details  Name: Ashley [name] MRN: [id number] Date of Birth: April 25, 1950   Medicare Important Message Given:  Yes    Loann Quill 11/21/2015, 9:15 AM

## 2015-11-21 NOTE — Progress Notes (Signed)
    Patient doing well, L leg numb/ting from psoas discomfort improving, pt has been up with PT and progressing well  Physical Exam: Vitals:   11/21/15 0538 11/21/15 0821  BP: 115/74 120/80  Pulse: 71   Resp: 20   Temp: 98.2 F (36.8 C)     Dressing in place, CDI, pt resting comfortably in bed NVI  S/P A/P fusion doing well orthopaedically, appreciate medicine teams input, CT was negative   - up with PT/OT, encourage ambulation - Continue current medication regimen  - likely d/c home today if cleared by medicine

## 2015-11-21 NOTE — Progress Notes (Signed)
SATURATION QUALIFICATIONS: (This note is used to comply with regulatory documentation for home oxygen)  Patient Saturations on Room Air at Rest = 97%  Patient Saturations on Room Air while Ambulating = 90%    Pt does not need home oxygen.

## 2015-11-23 NOTE — Discharge Summary (Signed)
Patient ID: Ashley [name] MRN: [id number] DOB/AGE: 1951/01/30 65 y.o.  Admit date: 11/16/2015 Discharge date: 11/23/2015  Admission Diagnoses:  Active Problems:   Radiculopathy   Acute respiratory failure (HCC)   COPD (chronic obstructive pulmonary disease) (HCC)   CHF exacerbation (HCC)   Hypotension   Elective surgery   Discharge Diagnoses:  Same  Past Medical History:  Diagnosis Date  . Arthritis   . Ascending aortic aneurysm ([place])   . Cancer [facility]) 1995   breast, left, mastectomy/chemo  . Chest pain    Possibly cardiac. No evidence of ischemia/injury based upon normal troponin I. Chest discomfort could be tachycardia induced supply demand mismatch.   . Colon adenomas   . Coronary artery disease   . Essential hypertension, benign   . GERD (gastroesophageal reflux disease)   . Hyperlipidemia   . Hypertension   . Palpitations   . Pure hypercholesterolemia   . Thrombocytosis (HCC)    Idiopathic    Surgeries: Procedure(s): POSTERIOR LUMBAR FUSION, LUMBAR 2-3, LUMBAR 3-4, LUMBAR 4-5 WITH INSTRUMENTATION AND ALLOGRAFT on 11/16/2015 - 11/17/2015   Consultants: Medicine team for fluid overload/COPD, resolved   Discharged Condition: Improved  Hospital Course: Ashley [name] is an 65 y.o. female who was admitted 11/16/2015 for operative treatment of spinal stenosis. Patient has severe unremitting pain that affects sleep, daily activities, and work/hobbies. After pre-op clearance the patient was taken to the operating room on 11/16/2015 - 11/17/2015 and underwent  Procedure(s): POSTERIOR LUMBAR FUSION, LUMBAR 2-3, LUMBAR 3-4, LUMBAR 4-5 WITH INSTRUMENTATION AND ALLOGRAFT.    Patient was given perioperative antibiotics:  Anti-infectives    Start     Dose/Rate Route Frequency Ordered Stop   11/21/15 0000  levofloxacin (LEVAQUIN) 500 MG tablet     500 mg Oral Daily 11/21/15 1020 11/28/15 2359   11/20/15 1100  levofloxacin (LEVAQUIN) IVPB 750 mg  Status:  Discontinued     750 mg 100  mL/hr over 90 Minutes Intravenous Every 24 hours 11/20/15 1044 11/21/15 1612   11/17/15 2000  vancomycin (VANCOCIN) IVPB 1000 mg/200 mL premix     1,000 mg 200 mL/hr over 60 Minutes Intravenous  Once 11/17/15 1400 11/17/15 2157   11/17/15 0800  vancomycin (VANCOCIN) IVPB 1000 mg/200 mL premix     1,000 mg 200 mL/hr over 60 Minutes Intravenous To Surgery 11/17/15 0758 11/17/15 0835   11/16/15 2200  vancomycin (VANCOCIN) IVPB 1000 mg/200 mL premix     1,000 mg 200 mL/hr over 60 Minutes Intravenous  Once 11/16/15 1653 11/16/15 2244   11/16/15 0555  vancomycin (VANCOCIN) IVPB 1000 mg/200 mL premix     1,000 mg 200 mL/hr over 60 Minutes Intravenous On call to O.R. 11/16/15 9242 11/16/15 0941       Patient was given sequential compression devices, early ambulation to prevent DVT.  Patient benefited maximally from hospital stay and there were no complications.    Recent vital signs: BP 110/61 (BP Location: Right Arm)   Pulse 88   Temp 98.4 F (36.9 C) (Oral)   Resp 19   Ht '5\' 2"'$  (1.575 m)   Wt 70.4 kg (155 lb 4 oz)   SpO2 94%   BMI 28.40 kg/m    Discharge Medications:     Medication List    TAKE these medications   CALCIUM 600 + D 600-200 MG-UNIT Tabs Generic drug:  Calcium Carb-Cholecalciferol Take 1 tablet by mouth 2 (two) times daily with a meal.   cholecalciferol 1000 units tablet Commonly  known as:  VITAMIN D Take 1,000 Units by mouth daily.   diltiazem 240 MG 24 hr capsule Commonly known as:  CARDIZEM CD Take 240 mg by mouth daily.   furosemide 40 MG tablet Commonly known as:  LASIX Take 1 tablet (40 mg total) by mouth daily as needed for edema. What changed:  when to take this  reasons to take this   gabapentin 300 MG capsule Commonly known as:  NEURONTIN Take 300 mg by mouth at bedtime.   hydroxyurea 500 MG capsule Commonly known as:  HYDREA 500 mg PO Mon and Tues and 1000 mg PO Wed- Sun   ibandronate 150 MG tablet Commonly known as:  BONIVA Take 1  tablet by mouth every 30 (thirty) days.   ipratropium-albuterol 0.5-2.5 (3) MG/3ML Soln Commonly known as:  DUONEB Take 3 mLs by nebulization every 6 (six) hours as needed (shortness of breath).   irbesartan-hydrochlorothiazide 150-12.5 MG tablet Commonly known as:  AVALIDE Take 1 tablet by mouth daily. Please hold if blood pressure less than 140/90. What changed:  additional instructions   isosorbide mononitrate 60 MG 24 hr tablet Commonly known as:  IMDUR Take 60 mg by mouth daily.   levofloxacin 500 MG tablet Commonly known as:  LEVAQUIN Take 1 tablet (500 mg total) by mouth daily.   methocarbamol 500 MG tablet Commonly known as:  ROBAXIN Take 1 tablet (500 mg total) by mouth every 6 (six) hours as needed for muscle spasms.   omeprazole 20 MG tablet Commonly known as:  PRILOSEC OTC Take 20 mg by mouth daily.   potassium chloride SA 20 MEQ tablet Commonly known as:  K-DUR,KLOR-CON Take 2 tablets (40 mEq total) by mouth daily as needed (take only with furosemide). What changed:  when to take this  reasons to take this   rosuvastatin 40 MG tablet Commonly known as:  CRESTOR Take 40 mg by mouth daily.       Diagnostic Studies: Dg Chest 2 View  Result Date: 11/18/2015 CLINICAL DATA:  Acute respiratory failure EXAM: CHEST  2 VIEW COMPARISON:  11/17/2015 FINDINGS: Cardiac enlargement. Mild vascular congestion. Prominent interstitial markings again noted. Bilateral pleural effusions are small. Findings may be due to heart failure and interstitial edema. Given history breast cancer, lymphangitic spread of tumor is a possibility. Mild left lower lobe atelectasis. IMPRESSION: Vascular congestion with prominent interstitial markings and bilateral effusions. Probable fluid overload. Followup following diuresis suggestive as lymphangitic spread of tumor is in the differential. Electronically Signed   By: Franchot Gallo M.D.   On: 11/18/2015 08:45   Dg Chest 2 View  Result Date:  11/14/2015 CLINICAL DATA:  Preoperative examination prior to lumbar fusion; history of tobacco smoking, hypertension, ascending aortic aneurysm, and left mastectomy. EXAM: CHEST  2 VIEW COMPARISON:  CT scan of the chest of January 12, 2015 FINDINGS: The lungs remain hyperinflated with hemidiaphragm flattening and increased AP dimension of the thorax. The interstitial markings are mildly prominent bilaterally. Post mastectomy changes on the left are observed. The heart and pulmonary vascularity are normal. The mediastinum is normal in width. There is calcification in the wall of the aortic arch. There is no pleural effusion. The bony thorax exhibits no acute abnormality. IMPRESSION: COPD with mild chronic interstitial prominence. No acute pneumonia, CHF, nor other acute cardiopulmonary abnormality. Electronically Signed   By: David  Martinique M.D.   On: 11/14/2015 11:55  Dg Lumbar Spine 2-3 Views  Result Date: 11/17/2015 CLINICAL DATA:  Status post lumbar fusion. EXAM:  DG C-ARM GT 120 MIN; LUMBAR SPINE - 2-3 VIEW FLUOROSCOPY TIME:  Radiation Exposure Index (as provided by the fluoroscopic device): If the device does not provide the exposure index: Fluoroscopy Time (in minutes and seconds):  3 minutes 4 seconds Number of Acquired Images:  2 COMPARISON:  08/24/2015 FINDINGS: Two images from portable C-arm radiography obtained in the operating room show hardware fixation with placement of posterior screws and interbody spacers extending from L2 through L5. IMPRESSION: 1. Status post lumbar fusion of L2 through L5. Electronically Signed   By: Kerby Moors M.D.   On: 11/17/2015 10:54   Dg Lumbar Spine 2-3 Views  Result Date: 11/16/2015 CLINICAL DATA:  Lumbar fusion EXAM: DG C-ARM GT 120 MIN; LUMBAR SPINE - 2-3 VIEW COMPARISON:  08/24/2015 FLUOROSCOPY TIME:  Radiation Exposure Index (as provided by the fluoroscopic device): Not available If the device does not provide the exposure index: Fluoroscopy Time:  3 minutes  24 seconds Number of Acquired Images:  3 FINDINGS: Interbody fusion is noted at L2-3, L3-4 and L4-5. Changes of prior fusion at L5-S1 are noted. IMPRESSION: Lumbar fusion from L2-L5. Electronically Signed   By: Inez Catalina M.D.   On: 11/16/2015 13:26   Ct Angio Chest Pe W Or Wo Contrast  Result Date: 11/19/2015 CLINICAL DATA:  Hypoxemia. Sudden onset of chest pain and shortness of breath. Recent lumbar spine surgery. EXAM: CT ANGIOGRAPHY CHEST WITH CONTRAST TECHNIQUE: Multidetector CT imaging of the chest was performed using the standard protocol during bolus administration of intravenous contrast. Multiplanar CT image reconstructions and MIPs were obtained to evaluate the vascular anatomy. CONTRAST:  100 cc Isovue IV COMPARISON:  Radiographs 11/18/2015.  Chest CT 01/10/2015 FINDINGS: Cardiovascular: There are no filling defects within the pulmonary arteries to suggest pulmonary embolus. Atherosclerosis of the thoracic aorta without dissection. Unchanged ascending thoracic aortic aneurysm measuring 4.2 x 4.0 cm. Coronary artery calcifications are seen. Mediastinum/Nodes: There is no mediastinal or hilar adenopathy. No pericardial effusion. Lungs/Pleura: Small to moderate bilateral pleural effusions. Adjacent atelectasis in the lower lobes. There is smooth septal thickening superimposed on emphysema, likely pulmonary edema. The bilateral small subcentimeter pulmonary nodules are grossly unchanged, partially obscured by atelectasis. There is probable debris in the left mainstem bronchus. Upper Abdomen: Calcified granuloma in the liver. The right adrenal mass on prior CT is not included in the field of view. Left breast prosthesis is noted. Musculoskeletal: There are no acute or suspicious osseous abnormalities. Coarsened trabecula within T6 is likely a hemangioma and unchanged from prior CT dating back to 2013. Review of the MIP images confirms the above findings. IMPRESSION: 1. No pulmonary embolus. 2. Bilateral  pleural effusions. Smooth septal thickening this likely pulmonary edema. Findings suggest fluid overload. 3. Debris within the left mainstem bronchus, may be mucous plugging or aspiration. 4. Unchanged atherosclerosis and ascending aortic aneurysmal dilatation. No evidence of dissection. Coronary artery calcifications. Electronically Signed   By: Jeb Levering M.D.   On: 11/19/2015 20:09   Dg Chest Port 1 View  Result Date: 11/17/2015 CLINICAL DATA:  Hypoxia EXAM: PORTABLE CHEST 1 VIEW COMPARISON:  11/14/2015 FINDINGS: Cardiomediastinal silhouette is normal. Mediastinal contours appear intact. There is no evidence of focal airspace consolidation, pleural effusion or pneumothorax. Upper lobe predominant emphysematous changes with chronic interstitial lung thickening noted. Linear opacity overlying the mid left lung, likely corresponds to a previously demonstrated by CT perifissural lung nodule. Osseous structures are without acute abnormality. Soft tissues are grossly normal. IMPRESSION: Emphysematous changes and chronic interstitial thickening,  without evidence of acute airspace consolidation. Known left pulmonary nodule. Electronically Signed   By: Fidela Salisbury M.D.   On: 11/17/2015 16:52   Dg C-arm Gt 120 Min  Result Date: 11/17/2015 CLINICAL DATA:  Status post lumbar fusion. EXAM: DG C-ARM GT 120 MIN; LUMBAR SPINE - 2-3 VIEW FLUOROSCOPY TIME:  Radiation Exposure Index (as provided by the fluoroscopic device): If the device does not provide the exposure index: Fluoroscopy Time (in minutes and seconds):  3 minutes 4 seconds Number of Acquired Images:  2 COMPARISON:  08/24/2015 FINDINGS: Two images from portable C-arm radiography obtained in the operating room show hardware fixation with placement of posterior screws and interbody spacers extending from L2 through L5. IMPRESSION: 1. Status post lumbar fusion of L2 through L5. Electronically Signed   By: Kerby Moors M.D.   On: 11/17/2015 10:54    Dg C-arm Gt 120 Min  Result Date: 11/16/2015 CLINICAL DATA:  Lumbar fusion EXAM: DG C-ARM GT 120 MIN; LUMBAR SPINE - 2-3 VIEW COMPARISON:  08/24/2015 FLUOROSCOPY TIME:  Radiation Exposure Index (as provided by the fluoroscopic device): Not available If the device does not provide the exposure index: Fluoroscopy Time:  3 minutes 24 seconds Number of Acquired Images:  3 FINDINGS: Interbody fusion is noted at L2-3, L3-4 and L4-5. Changes of prior fusion at L5-S1 are noted. IMPRESSION: Lumbar fusion from L2-L5. Electronically Signed   By: Inez Catalina M.D.   On: 11/16/2015 13:26    Disposition: 01-Home or Self Care  Discharge Instructions    DME Nebulizer machine    Complete by:  As directed     S/P A/P fusion doing well orthopaedically, appreciate medicine teams input, CT was negative for PE  - up with PT/OT, encourage ambulation - Continue current medication regimen  -Written scripts for pain signed and in chart -D/C instructions sheet printed and in chart -D/C today  -F/U in office 2 weeks   Signed: Justice Britain 11/23/2015, 11:49 AM

## 2015-11-28 ENCOUNTER — Encounter: Payer: Self-pay | Admitting: Cardiology

## 2015-12-01 ENCOUNTER — Ambulatory Visit: Payer: Medicare Other | Admitting: Cardiology

## 2015-12-06 ENCOUNTER — Telehealth (HOSPITAL_COMMUNITY): Payer: Self-pay | Admitting: *Deleted

## 2015-12-06 NOTE — Telephone Encounter (Signed)
Called patient back. She states her PCP, [facility], drew labs on 8/16 and told her her platelets were over 800. Last time here on 11/21/15 they were just over 300. She states here PCP was supposed to fax results to cancer center. Explained to patient that we had not received it yet. She is going to get them to re-fax lab results.

## 2015-12-08 ENCOUNTER — Telehealth (HOSPITAL_COMMUNITY): Payer: Self-pay

## 2015-12-08 NOTE — Telephone Encounter (Signed)
Notified patient that she needs to come in for lab work and Amy will call with appointment. She verbalized understanding.

## 2015-12-12 ENCOUNTER — Ambulatory Visit: Payer: Medicare Other | Admitting: Cardiology

## 2015-12-16 ENCOUNTER — Encounter (HOSPITAL_COMMUNITY): Payer: Medicare Other | Attending: Hematology & Oncology

## 2015-12-16 DIAGNOSIS — F17210 Nicotine dependence, cigarettes, uncomplicated: Secondary | ICD-10-CM | POA: Insufficient documentation

## 2015-12-16 DIAGNOSIS — I712 Thoracic aortic aneurysm, without rupture: Secondary | ICD-10-CM | POA: Diagnosis not present

## 2015-12-16 DIAGNOSIS — I509 Heart failure, unspecified: Secondary | ICD-10-CM | POA: Diagnosis not present

## 2015-12-16 DIAGNOSIS — K219 Gastro-esophageal reflux disease without esophagitis: Secondary | ICD-10-CM | POA: Insufficient documentation

## 2015-12-16 DIAGNOSIS — I2510 Atherosclerotic heart disease of native coronary artery without angina pectoris: Secondary | ICD-10-CM | POA: Insufficient documentation

## 2015-12-16 DIAGNOSIS — R002 Palpitations: Secondary | ICD-10-CM | POA: Diagnosis not present

## 2015-12-16 DIAGNOSIS — D473 Essential (hemorrhagic) thrombocythemia: Secondary | ICD-10-CM | POA: Insufficient documentation

## 2015-12-16 DIAGNOSIS — M1990 Unspecified osteoarthritis, unspecified site: Secondary | ICD-10-CM | POA: Diagnosis not present

## 2015-12-16 DIAGNOSIS — I110 Hypertensive heart disease with heart failure: Secondary | ICD-10-CM | POA: Diagnosis not present

## 2015-12-16 DIAGNOSIS — E7800 Pure hypercholesterolemia, unspecified: Secondary | ICD-10-CM | POA: Insufficient documentation

## 2015-12-16 DIAGNOSIS — Z853 Personal history of malignant neoplasm of breast: Secondary | ICD-10-CM | POA: Insufficient documentation

## 2015-12-16 DIAGNOSIS — I959 Hypotension, unspecified: Secondary | ICD-10-CM | POA: Insufficient documentation

## 2015-12-16 LAB — COMPREHENSIVE METABOLIC PANEL
ALT: 11 U/L — ABNORMAL LOW (ref 14–54)
AST: 21 U/L (ref 15–41)
Albumin: 3.7 g/dL (ref 3.5–5.0)
Alkaline Phosphatase: 78 U/L (ref 38–126)
Anion gap: 10 (ref 5–15)
BUN: 22 mg/dL — AB (ref 6–20)
CHLORIDE: 95 mmol/L — AB (ref 101–111)
CO2: 28 mmol/L (ref 22–32)
Calcium: 9.4 mg/dL (ref 8.9–10.3)
Creatinine, Ser: 1.23 mg/dL — ABNORMAL HIGH (ref 0.44–1.00)
GFR calc Af Amer: 51 mL/min — ABNORMAL LOW (ref >60)
GFR, EST NON AFRICAN AMERICAN: 44 mL/min — AB (ref >60)
Glucose, Bld: 110 mg/dL — ABNORMAL HIGH (ref 65–99)
POTASSIUM: 3.5 mmol/L (ref 3.5–5.1)
SODIUM: 133 mmol/L — AB (ref 135–145)
Total Bilirubin: 0.6 mg/dL (ref 0.3–1.2)
Total Protein: 7.1 g/dL (ref 6.5–8.1)

## 2015-12-16 LAB — CBC WITH DIFFERENTIAL/PLATELET
BASOS ABS: 0 10*3/uL (ref 0.0–0.1)
BASOS PCT: 0 %
EOS ABS: 0.2 10*3/uL (ref 0.0–0.7)
EOS PCT: 2 %
HCT: 33.8 % — ABNORMAL LOW (ref 36.0–46.0)
Hemoglobin: 11.5 g/dL — ABNORMAL LOW (ref 12.0–15.0)
LYMPHS PCT: 29 %
Lymphs Abs: 2.2 10*3/uL (ref 0.7–4.0)
MCH: 35 pg — ABNORMAL HIGH (ref 26.0–34.0)
MCHC: 34 g/dL (ref 30.0–36.0)
MCV: 102.7 fL — AB (ref 78.0–100.0)
MONO ABS: 0.5 10*3/uL (ref 0.1–1.0)
Monocytes Relative: 7 %
Neutro Abs: 4.8 10*3/uL (ref 1.7–7.7)
Neutrophils Relative %: 62 %
PLATELETS: 365 10*3/uL (ref 150–400)
RBC: 3.29 MIL/uL — ABNORMAL LOW (ref 3.87–5.11)
RDW: 15 % (ref 11.5–15.5)
WBC: 7.7 10*3/uL (ref 4.0–10.5)

## 2015-12-20 ENCOUNTER — Telehealth (HOSPITAL_COMMUNITY): Payer: Self-pay | Admitting: *Deleted

## 2015-12-20 NOTE — Telephone Encounter (Signed)
Pt aware labs are good and aware to continue taking same dose of Hydrea. Pt verbalized understanding.

## 2015-12-20 NOTE — Telephone Encounter (Signed)
-----   Message from [facility], PA-C sent at 12/16/2015  3:19 PM EDT ----- Kermit Balo.  Same dose of Hydrea.

## 2015-12-26 ENCOUNTER — Emergency Department (HOSPITAL_COMMUNITY)
Admission: EM | Admit: 2015-12-26 | Discharge: 2015-12-26 | Disposition: A | Discharge status: Home / Self Care | Payer: Medicare Other | Attending: Emergency Medicine | Admitting: Emergency Medicine

## 2015-12-26 ENCOUNTER — Other Ambulatory Visit: Payer: Self-pay

## 2015-12-26 ENCOUNTER — Encounter (HOSPITAL_COMMUNITY): Payer: Self-pay

## 2015-12-26 ENCOUNTER — Emergency Department (HOSPITAL_COMMUNITY): Payer: Medicare Other

## 2015-12-26 DIAGNOSIS — I959 Hypotension, unspecified: Secondary | ICD-10-CM | POA: Diagnosis present

## 2015-12-26 DIAGNOSIS — Z7982 Long term (current) use of aspirin: Secondary | ICD-10-CM | POA: Diagnosis not present

## 2015-12-26 DIAGNOSIS — J449 Chronic obstructive pulmonary disease, unspecified: Secondary | ICD-10-CM | POA: Insufficient documentation

## 2015-12-26 DIAGNOSIS — F17210 Nicotine dependence, cigarettes, uncomplicated: Secondary | ICD-10-CM | POA: Insufficient documentation

## 2015-12-26 DIAGNOSIS — R0602 Shortness of breath: Secondary | ICD-10-CM | POA: Diagnosis not present

## 2015-12-26 DIAGNOSIS — Z853 Personal history of malignant neoplasm of breast: Secondary | ICD-10-CM | POA: Diagnosis not present

## 2015-12-26 DIAGNOSIS — I110 Hypertensive heart disease with heart failure: Secondary | ICD-10-CM | POA: Diagnosis not present

## 2015-12-26 DIAGNOSIS — Z79899 Other long term (current) drug therapy: Secondary | ICD-10-CM | POA: Insufficient documentation

## 2015-12-26 DIAGNOSIS — I509 Heart failure, unspecified: Secondary | ICD-10-CM | POA: Diagnosis not present

## 2015-12-26 LAB — URINALYSIS, ROUTINE W REFLEX MICROSCOPIC
Bilirubin Urine: NEGATIVE
Glucose, UA: NEGATIVE mg/dL
Hgb urine dipstick: NEGATIVE
Ketones, ur: NEGATIVE mg/dL
LEUKOCYTES UA: NEGATIVE
NITRITE: NEGATIVE
PH: 6 (ref 5.0–8.0)
Protein, ur: NEGATIVE mg/dL
SPECIFIC GRAVITY, URINE: 1.016 (ref 1.005–1.030)

## 2015-12-26 LAB — BASIC METABOLIC PANEL
ANION GAP: 13 (ref 5–15)
BUN: 16 mg/dL (ref 6–20)
CO2: 26 mmol/L (ref 22–32)
Calcium: 9.9 mg/dL (ref 8.9–10.3)
Chloride: 94 mmol/L — ABNORMAL LOW (ref 101–111)
Creatinine, Ser: 1.04 mg/dL — ABNORMAL HIGH (ref 0.44–1.00)
GFR calc Af Amer: >60 mL/min (ref >60)
GFR calc non Af Amer: 54 mL/min — ABNORMAL LOW (ref >60)
GLUCOSE: 119 mg/dL — AB (ref 65–99)
POTASSIUM: 3.3 mmol/L — AB (ref 3.5–5.1)
Sodium: 133 mmol/L — ABNORMAL LOW (ref 135–145)

## 2015-12-26 LAB — I-STAT TROPONIN, ED: TROPONIN I, POC: 0 ng/mL (ref 0.00–0.08)

## 2015-12-26 LAB — CBC
HEMATOCRIT: 35 % — AB (ref 36.0–46.0)
HEMOGLOBIN: 11.5 g/dL — AB (ref 12.0–15.0)
MCH: 34.3 pg — AB (ref 26.0–34.0)
MCHC: 32.9 g/dL (ref 30.0–36.0)
MCV: 104.5 fL — AB (ref 78.0–100.0)
Platelets: 417 10*3/uL — ABNORMAL HIGH (ref 150–400)
RBC: 3.35 MIL/uL — ABNORMAL LOW (ref 3.87–5.11)
RDW: 15.1 % (ref 11.5–15.5)
WBC: 8.4 10*3/uL (ref 4.0–10.5)

## 2015-12-26 LAB — BRAIN NATRIURETIC PEPTIDE: B NATRIURETIC PEPTIDE 5: 37.6 pg/mL (ref 0.0–100.0)

## 2015-12-26 LAB — D-DIMER, QUANTITATIVE: D-Dimer, Quant: 0.41 ug/mL-FEU (ref 0.00–0.50)

## 2015-12-26 MED ORDER — SODIUM CHLORIDE 0.9 % IV BOLUS (SEPSIS)
500.0000 mL | Freq: Once | INTRAVENOUS | Status: AC
  Administered 2015-12-26: 500 mL via INTRAVENOUS

## 2015-12-26 NOTE — ED Notes (Signed)
EDP remains at bedside

## 2015-12-26 NOTE — ED Notes (Signed)
PT ambulates to restroom with walker.

## 2015-12-26 NOTE — ED Provider Notes (Signed)
[facility] DEPT Provider Note   CSN: [id number] Arrival date & time: 12/26/15  1332     History   Chief Complaint Chief Complaint  Patient presents with  . Shortness of Breath  . Hypotension    HPI  Ashley [name] is a 65 y.o. female with h/o COPD, congestive HF (Echo 11/17/2015 EF 55-60%) with recent spinal fusion 11/16/2015 presents for general weakness with decreased appetite and weight loss since her surgery. She additionally reports worsening exertional SOB and weakness since her surgery.  Since her surgery she has had "spells" of SOB that last for several minutes at a time but have not been associated with O2 desaturation when checked. She also has been having back pain between her shoulder blades that that seems to her to occur at random. These episodes last for seconds and clear at ransome She has not had chest pain, denies shortness of breath at rest, recent fevers or chills. She denies abdominal pain, has had intermittent nausea controlled with zofran but no emesis. denies diarrhea Significantly her daughter who is a nurse has been checking her blood pressure and it has been low to the 70s/50s. At these times she has had dizziness when standing abruptly but no falls. Because of hypotension, she has not been taking lasix or avalide, but has continued to take isosorbide and diltiazem as prescribed.   Past Medical History:  Diagnosis Date  . Arthritis   . Ascending aortic aneurysm ([place])   . Cancer [facility]) 1995   breast, left, mastectomy/chemo  . Chest pain    Possibly cardiac. No evidence of ischemia/injury based upon normal troponin I. Chest discomfort could be tachycardia induced supply demand mismatch.   . Colon adenomas   . Coronary artery disease   . Essential hypertension, benign   . GERD (gastroesophageal reflux disease)   . Hyperlipidemia   . Hypertension   . Palpitations   . Pure hypercholesterolemia   . Thrombocytosis ([place])    Idiopathic    Patient Active  Problem List   Diagnosis Date Noted  . Acute respiratory failure ([place]) 11/17/2015  . COPD (chronic obstructive pulmonary disease) ([place]) 11/17/2015  . CHF exacerbation ([place]) 11/17/2015  . Hypotension 11/17/2015  . Elective surgery   . Radiculopathy 11/16/2015  . Status post vaginal hysterectomy 12/09/2014    Past Surgical History:  Procedure Laterality Date  . ANTERIOR AND POSTERIOR REPAIR N/A 12/09/2014   Procedure: ANTERIOR (CYSTOCELE) AND POSTERIOR REPAIR (RECTOCELE);  Surgeon: Bjorn Loser, MD;  Location: [facility];  Service: Urology;  Laterality: N/A;  . ANTERIOR LAT LUMBAR FUSION Left 11/16/2015   Procedure: LEFT SIDED LATERAL INTERBODY FUSION, LUMBAR 2-3, LUMBAR 3-4, LUMBAR 4-5 WITH INSTRUMENTATION;  Surgeon: Phylliss Bob, MD;  Location: [facility];  Service: Orthopedics;  Laterality: Left;  LEFT SIDED LATEARL INTERBODY FUSION, LUMBAR 2-3, LUMBAR 3-4, LUMBAR 4-5 WITH INSTRUMENTATION   . APPENDECTOMY    . BONE MARROW ASPIRATION  07/2012  . BONE MARROW BIOPSY  07/2012  . BREAST SURGERY    . CARDIAC CATHETERIZATION    . COLONOSCOPY  11/29/2010   Procedure: COLONOSCOPY;  Surgeon: Rogene Houston, MD;  [facility];  Service: Endoscopy;  Laterality: N/A;  . COLONOSCOPY N/A 02/18/2014   Procedure: COLONOSCOPY;  Surgeon: Rogene Houston, MD;  [facility];  Service: Endoscopy;  Laterality: N/A;  1030  . CYSTOSCOPY N/A 12/09/2014   Procedure: CYSTOSCOPY;  Surgeon: Bjorn Loser, MD;  Location: [facility];  Service: Urology;  Laterality: N/A;  .  MASTECTOMY     left  . OVARIAN CYST SURGERY     x2  . SALPINGOOPHORECTOMY Bilateral 12/09/2014   Procedure: SALPINGO OOPHORECTOMY;  Surgeon: Servando Salina, MD;  Location: [facility];  Service: Gynecology;  Laterality: Bilateral;  . TUBAL LIGATION    . VAGINAL HYSTERECTOMY N/A 12/09/2014   Procedure: HYSTERECTOMY VAGINAL;  Surgeon: Servando Salina, MD;  Location: [facility];  Service: Gynecology;  Laterality: N/A;    OB History     No data available       Home Medications    Prior to Admission medications   Medication Sig Start Date End Date Taking? Authorizing Provider  Calcium Carb-Cholecalciferol (CALCIUM 600+D) 600-800 MG-UNIT TABS Take 1 tablet by mouth 2 (two) times daily.   Yes Historical Provider, MD  Cholecalciferol (VITAMIN D-3) 1000 units CAPS Take 1 capsule by mouth daily.   Yes Historical Provider, MD  diazepam (VALIUM) 5 MG tablet Take 5 mg by mouth at bedtime. 12/08/15  Yes Historical Provider, MD  diltiazem (CARDIZEM CD) 240 MG 24 hr capsule Take 240 mg by mouth daily.     Yes Historical Provider, MD  gabapentin (NEURONTIN) 300 MG capsule Take 300 mg by mouth at bedtime.   Yes Historical Provider, MD  hydroxyurea (HYDREA) 500 MG capsule 500 mg PO Mon and Tues and 1000 mg PO Wed- Sun Patient taking differently: Take 500-1,000 mg by mouth See admin instructions. 1,000 mg on Sun/Wed/Thurs/Fri/Sat and 500 mg on Mon/Tues 10/06/15  Yes Thomas S Kefalas, PA-C  ipratropium-albuterol (DUONEB) 0.5-2.5 (3) MG/3ML SOLN Take 3 mLs by nebulization every 6 (six) hours as needed (shortness of breath). 11/21/15  Yes Mauricio Gerome Apley, MD  isosorbide mononitrate (IMDUR) 60 MG 24 hr tablet Take 60 mg by mouth daily.     Yes Historical Provider, MD  methocarbamol (ROBAXIN) 500 MG tablet Take 1 tablet (500 mg total) by mouth every 6 (six) hours as needed for muscle spasms. 11/19/15  Yes Loni Dolly, PA-C  omeprazole (PRILOSEC OTC) 20 MG tablet Take 20 mg by mouth daily.   Yes Historical Provider, MD  ondansetron (ZOFRAN-ODT) 4 MG disintegrating tablet Take 4 mg by mouth 3 (three) times daily as needed for nausea or vomiting.   Yes Historical Provider, MD  oxyCODONE-acetaminophen (PERCOCET/ROXICET) 5-325 MG tablet Take 1 tablet by mouth at bedtime. 12/03/15  Yes Historical Provider, MD  rosuvastatin (CRESTOR) 40 MG tablet Take 40 mg by mouth daily.     Yes Historical Provider, MD  traMADol (ULTRAM) 50 MG tablet Take 2 tablets by  mouth every 6 (six) hours as needed. 12/16/15  Yes Historical Provider, MD  aspirin EC 81 MG tablet Take 81 mg by mouth daily.    Historical Provider, MD  furosemide (LASIX) 40 MG tablet Take 1 tablet (40 mg total) by mouth daily as needed for edema. 11/21/15   Mauricio Gerome Apley, MD  ibandronate (BONIVA) 150 MG tablet Take 1 tablet by mouth every 30 (thirty) days. 07/14/12   Historical Provider, MD  irbesartan-hydrochlorothiazide (AVALIDE) 150-12.5 MG tablet Take 1 tablet by mouth daily. Please hold if blood pressure less than 140/90. 11/21/15   Mauricio Gerome Apley, MD  potassium chloride SA (K-DUR,KLOR-CON) 20 MEQ tablet Take 2 tablets (40 mEq total) by mouth daily as needed (take only with furosemide). Patient not taking: Reported on 12/26/2015 11/21/15   Tawni Millers, MD    Family History Family History  Problem Relation Age of Onset  . Hypertension Mother   . Heart failure Mother   .  Congestive Heart Failure Mother   . COPD Mother   . Pernicious anemia Mother   . Cancer Mother     lung  . Hypertension Father   . CAD Father   . Heart attack Father   . Hypertension Sister   . Cancer Other   . Celiac disease Other     Social History Social History  Substance Use Topics  . Smoking status: Current Every Day Smoker    Packs/day: 0.50    Years: 50.00    Types: Cigarettes  . Smokeless tobacco: Never Used  . Alcohol use No     Allergies   Penicillins and Tape   Review of Systems Review of Systems  Constitutional: Positive for activity change, appetite change and fatigue. Negative for chills, diaphoresis, fever and unexpected weight change.  HENT: Negative.   Eyes: Negative.   Respiratory: Positive for chest tightness and shortness of breath. Negative for apnea, cough, choking, wheezing and stridor.   Cardiovascular: Negative.   Gastrointestinal: Negative.   Genitourinary: Negative.   Allergic/Immunologic: Negative.   Neurological: Negative.      Physical  Exam Updated Vital Signs BP 103/64   Pulse 73   Temp 98 F (36.7 C) (Oral)   Resp 15   Ht 5' 2" (1.575 m)   Wt 54 kg   SpO2 95%   BMI 21.77 kg/m   Physical Exam  Constitutional: She is oriented to person, place, and time. She appears well-developed. No distress.  HENT:  Head: Normocephalic.  Eyes: EOM are normal. Pupils are equal, round, and reactive to light.  Neck: Normal range of motion.  Pulmonary/Chest: Breath sounds normal. No respiratory distress. She has no wheezes.  Abdominal: Soft. Bowel sounds are normal. She exhibits no distension. There is no tenderness.  Musculoskeletal: She exhibits no edema, tenderness or deformity.  No tenderness of her back to palpation  Neurological: She is alert and oriented to person, place, and time.  Skin: Skin is warm and dry.  Psychiatric: She has a normal mood and affect.     ED Treatments / Results  Labs (all labs ordered are listed, but only abnormal results are displayed) Labs Reviewed  BASIC METABOLIC PANEL - Abnormal; Notable for the following:       Result Value   Sodium 133 (*)    Potassium 3.3 (*)    Chloride 94 (*)    Glucose, Bld 119 (*)    Creatinine, Ser 1.04 (*)    GFR calc non Af Amer 54 (*)    All other components within normal limits  CBC - Abnormal; Notable for the following:    RBC 3.35 (*)    Hemoglobin 11.5 (*)    HCT 35.0 (*)    MCV 104.5 (*)    MCH 34.3 (*)    Platelets 417 (*)    All other components within normal limits  URINALYSIS, ROUTINE W REFLEX MICROSCOPIC (NOT AT [facility])  D-DIMER, QUANTITATIVE (NOT AT [facility])  BRAIN NATRIURETIC PEPTIDE  I-STAT TROPOININ, ED    EKG  EKG Interpretation  Date/Time:  Monday December 26 2015 13:43:23 EDT Ventricular Rate:  80 PR Interval:  168 QRS Duration: 94 QT Interval:  396 QTC Calculation: 456 R Axis:   84 Text Interpretation:  Normal sinus rhythm Normal ECG Confirmed by MESNER MD, Corene Cornea [phone]) on 12/26/2015 8:01:32 PM       Radiology Dg Chest  2 View  Result Date: 12/26/2015 CLINICAL DATA:  Lumbar fusion 4 weeks ago, short of  breath off and on since surgery, weakness, has not felt well, smoker, coronary artery disease, COPD, CHF EXAM: CHEST  2 VIEW COMPARISON:  11/18/2015 FINDINGS: Normal heart size, mediastinal contours, and pulmonary vascularity. Lungs emphysematous but clear. No infiltrate, pleural effusion, or pneumothorax. Bones demineralized with evidence of prior lumbar fusion. IMPRESSION: COPD changes without acute infiltrate. Electronically Signed   By: Lavonia Dana M.D.   On: 12/26/2015 14:55    Procedures Procedures (including critical care time)  Medications Ordered in ED Medications  sodium chloride 0.9 % bolus 500 mL (0 mLs Intravenous Stopped 12/26/15 2025)     Initial Impression / Assessment and Plan / ED Course  I have reviewed the triage vital signs and the nursing notes.  Pertinent labs & imaging results that were available during my care of the patient were reviewed by me and considered in my medical decision making (see chart for details).  Clinical Course    Uncomplicated clinical course  Final Clinical Impressions(s) / ED Diagnoses   Final diagnoses:  Shortness of breath  Hypotension, unspecified hypotension type    64 y/o F with PMH sig for COPD, coronary spasm, CHF (EF 55-60%) presents for weakness, shortness of breath. CXR was neg for evidence of cardiovascular and pulmonary disease, d dimer was negative, BNP was negative, she had no evidence of respiratory distress in the ED and had normal oxygen saturations on room air. She remained free of chest pain, troponin was negative with a normal EKG. She was given 500 ml bolus of NS in the ED with some improvement in her symptoms. Her blood pressures remained stable with some improvement after IV fluids. She has a cardiology appointment tomorrow and was additionally counseled to follow with her PCP. She was clinically improved and clinically stable by time  of discharge from the ED and encouraged to follow up as directed. ED return precautions were discussed  New Prescriptions Discharge Medication List as of 12/26/2015 10:14 PM      Larron Armor A. Lincoln Brigham MD, [facility] Resident PGY-3 Pager [phone]    Veatrice Bourbon, MD 12/27/15 0005    Merrily Pew, MD 12/27/15 [phone]

## 2015-12-26 NOTE — Discharge Instructions (Signed)
You were seen for weakness, occasional shortness of breath, hypotension. Please follow with your cardiologist as scheduled to discuss antihypertensive and cardiac medications which are likely contributing to hypotension and weakness. If you have worsening symptoms return to emergency department for further evaluation.

## 2015-12-26 NOTE — ED Notes (Addendum)
Pt updated on wait. Pt given 2 warm blankets. BP is still low. Notified RN.

## 2015-12-26 NOTE — ED Notes (Signed)
MD at bedside. 

## 2015-12-26 NOTE — ED Notes (Signed)
Phlebotomy at bedside.

## 2015-12-26 NOTE — ED Notes (Signed)
PT reports she can not provide a urine sample at this time. PT urinated just before being called back to ED bed. PT refuses in and out cath. PT requests something to drink.

## 2015-12-26 NOTE — ED Triage Notes (Signed)
Per Pt, Pt is coming from home. Pt had back surgery August 2nd and 3rd. Since surgery, pt's BP has decreased. Pt has had episodes of SOB. Pt went into CHF after surgery. Today, pt went to see MD and was found to have BP of 70/40. Pt is currently mentating well. Pt is alert and oriented x4 upon arrival.

## 2015-12-27 ENCOUNTER — Ambulatory Visit (INDEPENDENT_AMBULATORY_CARE_PROVIDER_SITE_OTHER): Payer: Medicare Other | Admitting: Cardiology

## 2015-12-27 ENCOUNTER — Encounter: Payer: Self-pay | Admitting: Cardiology

## 2015-12-27 ENCOUNTER — Encounter: Payer: Self-pay | Admitting: Cardiovascular Disease

## 2015-12-27 VITALS — BP 98/64 | HR 105 | Ht 63.0 in | Wt 119.8 lb

## 2015-12-27 DIAGNOSIS — R0602 Shortness of breath: Secondary | ICD-10-CM

## 2015-12-27 NOTE — Patient Instructions (Addendum)
Your physician recommends that you continue on your current medications as directed. Please refer to the Current Medication list given to you today.  CONTINUE  TO HOLD   MEDS    Your physician has requested that you have an echocardiogram. Echocardiography is a painless test that uses sound waves to create images of your heart. It provides your doctor with information about the size and shape of your heart and how well your heart's chambers and valves are working. This procedure takes approximately one hour. There are no restrictions for this procedure..   Your physician has requested that you have cardiac CT. Cardiac computed tomography (CT) is a painless test that uses an x-ray machine to take clear, detailed pictures of your heart. For further information please visit [facility]. Please follow instruction sheet as given.   Your physician recommends that you schedule a follow-up appointment in:  2-3 [place]

## 2015-12-27 NOTE — Progress Notes (Signed)
12/28/2015 Ashley [name] Ashley [name]   [birth year]  [id number]  Primary Physician [name], NP Primary Cardiologist:  Dr. Angelena Form   Reason for Visit/CC: Hypotension, fatigue, dyspnea  HPI:  65 yo female with history of HTN, HLD, tobacco abuse, GERD, breast cancer. She was seen by Dr. Angelena Form as a new patient 11/08/15 for cardiac evaluation. She has no known heart disease. She had a cardiac cath in 1997 and reports no CAD. She was told she may have had coronary spasms. She has an ascending aortic aneurysm followed in primary care. Last CTA in Sept 2016 with 4.2 cm ascending aorta. She has smoked 1/2 ppd for 45 years. She was seen by Dr. Angelena Form for preoperative clearance prior to undergoing back surgery for spinal stenosis. She denied CP but given her risk factors, he ordered for her to undergo a NST to exclude ischemia. This was performed 11/09/15. The study was normal w/o ischemia. EF was 63%. She was cleared for surgery which was preformed 11/16/15 w/o any serious complications. Her post op recovery was complicated by hypoxia. IM was consulted. She underwent chest CT 11/19/15 which showed no PE. There was Atherosclerosis of the thoracic aorta without dissection. Unchanged ascending thoracic aortic aneurysm measuring 4.2 x 4.0 cm. Coronary artery calcifications were seen.  CXR did suggest new onset cardiomegaly with increasing pulmonary vascular congestion. This was after receiving perioperative IVFs, ~4.3 L over a 2 day period. Fluids were stopped and she was placed on IV Lasix. Echo showed normal LVEF of 55-60%. Wall motion was normal. Left ventricular diastolic function parameters were normal. Valve function was normal. She was discharged on 11/16/15.  She presented to the ED yesterday on 12/26/15 with complaint of dyspnea and hypotension. Her daughter who is a nurse has been checking her blood pressure and it has been low to the 70s/50s.  She has had dizziness when standing abruptly but no falls. Because of  hypotension, she has not been taking lasix or avalide, but has continued to take isosorbide and diltiazem as prescribed. CXR was neg for evidence of cardiovascular and pulmonary disease, d dimer was negative, BNP was negative, she had no evidence of respiratory distress in the ED and had normal oxygen saturations on room air. She remained free of chest pain, troponin was negative with a normal EKG. She was given 500 ml bolus of NS in the ED with some improvement in her symptoms. Her blood pressures remained stable with some improvement after IV fluids. She was sent home from ED and did not require admission.   She is now in clinic today for f/u. She continues to feel poorly. BP is soft but stable today and all of her antihypertensives have been on hold. She notes significant weight loss since her surgery, nearly 20 lb over the last 2-3 weeks. She does not have an appetite. Unexplained. Stays nauseated but able to take fluids. Only new meds were post operative pain meds.   She also complains of recent bouts of sudden dyspnea to comes and goes and last several seconds at a time. Feels like she cant catch her breath. Occurs at rest. Notes associated near syncope but no frank syncope. She denies anterior chest pain but has frequent mid scapular pain often that occurs at rest and with exertion. She continues to smoke daily. Her father had an MI at age 50. She is currently pain free and breathing comfortably on RA.     Current Outpatient Prescriptions  Medication Sig Dispense Refill  .  Calcium Carb-Cholecalciferol (CALCIUM 600+D) 600-800 MG-UNIT TABS Take 1 tablet by mouth 2 (two) times daily.    . Cholecalciferol (VITAMIN D-3) 1000 units CAPS Take 1 capsule by mouth daily.    . diazepam (VALIUM) 5 MG tablet Take 5 mg by mouth every 6 (six) hours as needed for muscle spasms.    Marland Kitchen gabapentin (NEURONTIN) 300 MG capsule Take 300 mg by mouth at bedtime.    . hydroxyurea (HYDREA) 500 MG capsule 500 mg PO Mon and  Tues and 1000 mg PO Wed- Sun (Patient taking differently: Take 500-1,000 mg by mouth See admin instructions. 1,000 mg on Sun/Wed/Thurs/Fri/Sat and 500 mg on Mon/Tues) 50 capsule 0  . ipratropium-albuterol (DUONEB) 0.5-2.5 (3) MG/3ML SOLN Take 3 mLs by nebulization every 6 (six) hours as needed (shortness of breath). 360 mL 0  . methocarbamol (ROBAXIN) 500 MG tablet Take 1 tablet (500 mg total) by mouth every 6 (six) hours as needed for muscle spasms. 60 tablet 0  . omeprazole (PRILOSEC OTC) 20 MG tablet Take 20 mg by mouth daily.    . ondansetron (ZOFRAN-ODT) 4 MG disintegrating tablet Take 4 mg by mouth 3 (three) times daily as needed for nausea or vomiting.    Marland Kitchen oxyCODONE-acetaminophen (PERCOCET/ROXICET) 5-325 MG tablet Take 1-2 tablets by mouth every 6 (six) hours as needed for moderate pain or severe pain.    . rosuvastatin (CRESTOR) 40 MG tablet Take 40 mg by mouth daily.      . traMADol (ULTRAM) 50 MG tablet Take one to two (1 - 2) tablets (50 mg - 100 mg total) by mouth three (3) times daily as needed for pain.    Marland Kitchen aspirin EC 81 MG tablet Take 81 mg by mouth daily.    Marland Kitchen diltiazem (CARDIZEM CD) 240 MG 24 hr capsule Take 240 mg by mouth daily.      . furosemide (LASIX) 40 MG tablet Take 1 tablet (40 mg total) by mouth daily as needed for edema. (Patient not taking: Reported on 12/27/2015) 30 tablet 0  . ibandronate (BONIVA) 150 MG tablet Take 1 tablet by mouth every 30 (thirty) days.    . irbesartan-hydrochlorothiazide (AVALIDE) 150-12.5 MG tablet Take 1 tablet by mouth daily. Please hold if blood pressure less than 140/90. (Patient not taking: Reported on 12/27/2015) 30 tablet 0  . isosorbide mononitrate (IMDUR) 60 MG 24 hr tablet Take 60 mg by mouth daily.       No current facility-administered medications for this visit.     Allergies  Allergen Reactions  . Penicillins Hives and Rash    Has patient had a PCN reaction causing immediate rash, facial/tongue/throat swelling, SOB or  lightheadedness with hypotension: Yes Has patient had a PCN reaction causing severe rash involving mucus membranes or skin necrosis: Yes Has patient had a PCN reaction that required hospitalization No Has patient had a PCN reaction occurring within the last 10 years: Yes If all of the above answers are "NO", then may proceed with Cephalosporin use.   . Tape Rash    Medipore, Coban, and paper tape CAN be tolerated    Social History   Social History  . Marital status: Divorced    Spouse name: N/A  . Number of children: 2  . Years of education: N/A   Occupational History  . Not on file.   Social History Main Topics  . Smoking status: Current Every Day Smoker    Packs/day: 0.50    Years: 50.00    Types: Cigarettes  .  Smokeless tobacco: Never Used  . Alcohol use No  . Drug use: No  . Sexual activity: Yes    Birth control/ protection: Post-menopausal   Other Topics Concern  . Not on file   Social History Narrative  . No narrative on file     Review of Systems: General: negative for chills, fever, night sweats or weight changes.  Cardiovascular: negative for chest pain, dyspnea on exertion, edema, orthopnea, palpitations, paroxysmal nocturnal dyspnea or shortness of breath Dermatological: negative for rash Respiratory: negative for cough or wheezing Urologic: negative for hematuria Abdominal: negative for nausea, vomiting, diarrhea, bright red blood per rectum, melena, or hematemesis Neurologic: negative for visual changes, syncope, or dizziness All other systems reviewed and are otherwise negative except as noted above.    Blood pressure 98/64, pulse (!) 105, height '5\' 3"'$  (1.6 m), weight 119 lb 12.8 oz (54.3 kg).  General appearance: alert, cooperative and thin/ frail appearing Neck: no carotid bruit and no JVD Lungs: clear to auscultation bilaterally Heart: regular rate and rhythm, S1, S2 normal, no murmur, click, rub or gallop Extremities: no LEE Pulses: 2+ and  symmetric Skin: warm and dry Neurologic: Grossly normal  EKG NSR  ASSESSMENT AND PLAN:   1. Hypotension: likely secondary to reduced PO intake. CBC in the ED yesterday showed stable Hgb at 11.5 which is her baseline. Patient is off all of her antihypertensive meds. Patient not tolerating solids. Patient encouraged to increase fluid intake if possible to help improve BP (Gatorade, soup). Continue to hold antihypertensive meds to avoid severe hypotension. Will also recheck a limited echo to r/o effusion and reassess LVEF.   2. Recurrent Mid Scapular Pain: patient notes frequent symptoms of pain between both shoulder blades. She has been an everyday smoker for over 45 years. She has a strong family h/o CAD. Her father had an MI at age 42. She also has h/o HTN and HLD. She has also had periodic resting dyspnea and fatigue, although may primarily be related to hypotension. She had a recent NST that was "low risk". Given her symtoms and multiple risk factors + Coronary artery calcifications seen on CT, we will order a cardiac CT to better assess for underlying CAD.    PLAN  Limited echo. Coronary CTA. F/u in 2 weeks.   Lyda Jester PA-C 12/28/2015 12:32 PM

## 2015-12-30 ENCOUNTER — Telehealth: Payer: Self-pay | Admitting: Cardiology

## 2015-12-30 NOTE — Telephone Encounter (Signed)
Called patient, talked to Amy, patient's daughter (DPR). She stated that patient was doing better and her BP was 120/98, HR 70's to 80's. Patient was instructed last office visit to hold BP medication due to her BP being so low. Amy is concerned that patient's BP is going to go up over the weekend, and she would like to know what medication her mother can start taking again. Consulted Dr. Angelena Form (DOD), he recommends patient to go back on diltiazem, and if her BP goes higher to start back on her Avalide. Called Amy back, informed her of recommendations from Dr. Angelena Form. Patient's daughter, Amy, verbalized understanding.

## 2015-12-30 NOTE — Telephone Encounter (Signed)
New Message:>     Pt was taken off all of her blood pressure medicine because her blood pressure was too low. Past few days  The Bystolic pressure have started going back up,if it continues to be to get too high,which one should she start back on? Please call today if possible.

## 2016-01-04 ENCOUNTER — Other Ambulatory Visit: Payer: Self-pay

## 2016-01-04 ENCOUNTER — Ambulatory Visit (HOSPITAL_COMMUNITY): Payer: Medicare Other | Attending: Cardiology

## 2016-01-04 ENCOUNTER — Other Ambulatory Visit: Payer: Self-pay | Admitting: Cardiology

## 2016-01-04 DIAGNOSIS — R0602 Shortness of breath: Secondary | ICD-10-CM | POA: Diagnosis not present

## 2016-01-06 ENCOUNTER — Telehealth: Payer: Self-pay | Admitting: Cardiovascular Disease

## 2016-01-06 NOTE — Telephone Encounter (Signed)
I spoke with pt and told her she should keep appt for CT

## 2016-01-06 NOTE — Telephone Encounter (Signed)
New message     Pt calling stating that someone called her and she wants to know if she still needs to have the CT on Thursday 9/27. Please call.

## 2016-01-09 ENCOUNTER — Other Ambulatory Visit (HOSPITAL_COMMUNITY): Payer: Medicare Other

## 2016-01-09 ENCOUNTER — Encounter (HOSPITAL_BASED_OUTPATIENT_CLINIC_OR_DEPARTMENT_OTHER): Payer: Medicare Other | Admitting: Hematology & Oncology

## 2016-01-09 ENCOUNTER — Encounter (HOSPITAL_COMMUNITY): Payer: Self-pay | Admitting: Hematology & Oncology

## 2016-01-09 VITALS — BP 117/90 | HR 78 | Temp 97.3°F | Resp 16 | Ht 61.5 in | Wt 119.0 lb

## 2016-01-09 DIAGNOSIS — R110 Nausea: Secondary | ICD-10-CM | POA: Diagnosis not present

## 2016-01-09 DIAGNOSIS — R112 Nausea with vomiting, unspecified: Secondary | ICD-10-CM

## 2016-01-09 DIAGNOSIS — D473 Essential (hemorrhagic) thrombocythemia: Secondary | ICD-10-CM

## 2016-01-09 DIAGNOSIS — C4372 Malignant melanoma of left lower limb, including hip: Secondary | ICD-10-CM

## 2016-01-09 DIAGNOSIS — Z853 Personal history of malignant neoplasm of breast: Secondary | ICD-10-CM

## 2016-01-09 DIAGNOSIS — C439 Malignant melanoma of skin, unspecified: Secondary | ICD-10-CM

## 2016-01-09 DIAGNOSIS — R634 Abnormal weight loss: Secondary | ICD-10-CM

## 2016-01-09 DIAGNOSIS — C50912 Malignant neoplasm of unspecified site of left female breast: Secondary | ICD-10-CM | POA: Insufficient documentation

## 2016-01-09 DIAGNOSIS — Z85820 Personal history of malignant melanoma of skin: Secondary | ICD-10-CM | POA: Diagnosis not present

## 2016-01-09 HISTORY — DX: Essential (hemorrhagic) thrombocythemia: D47.3

## 2016-01-09 HISTORY — DX: Malignant melanoma of skin, unspecified: C43.9

## 2016-01-09 HISTORY — DX: Malignant neoplasm of unspecified site of left female breast: C50.912

## 2016-01-09 MED ORDER — DRONABINOL 2.5 MG PO CAPS: 2.5000 mg | ORAL_CAPSULE | Freq: Two times a day (BID) | ORAL | 2 refills | Status: DC

## 2016-01-09 NOTE — Patient Instructions (Signed)
Ashley [name] at [facility] Discharge Instructions  RECOMMENDATIONS MADE BY THE CONSULTANT AND ANY TEST RESULTS WILL BE SENT TO YOUR REFERRING PHYSICIAN.  You saw Dr. Whitney Muse today. Follow up in 6 weeks with lab work. Marinol has been sent to your pharmacy to help with appetite. Appointment with Dr. Laural Golden.  Thank you for choosing [place] at [facility] to provide your oncology and hematology care.  To afford each patient quality time with our provider, please arrive at least 15 minutes before your scheduled appointment time.   Beginning January 23rd 2017 lab work for the [facility] will be done in the  Main lab at [facility] on 1st floor. If you have a lab appointment with the [facility] please come in thru the  Main Entrance and check in at the main information desk  You need to re-schedule your appointment should you arrive 10 or more minutes late.  We strive to give you quality time with our providers, and arriving late affects you and other patients whose appointments are after yours.  Also, if you no show three or more times for appointments you may be dismissed from the clinic at the providers discretion.     Again, thank you for choosing [facility].  Our hope is that these requests will decrease the amount of time that you wait before being seen by our physicians.       _____________________________________________________________  Should you have questions after your visit to [facility], please contact our office at [phone] between the hours of 8:30 a.m. and 4:30 p.m.  Voicemails left after 4:30 p.m. will not be returned until the following business day.  For prescription refill requests, have your pharmacy contact our office.         Resources For Cancer Patients and their Caregivers ? American Cancer Society: Can assist with transportation, wigs, general needs, runs Look Good Feel Better.         9591595146 ? Cancer Care: Provides financial assistance, online support groups, medication/co-pay assistance.  1-800-813-HOPE (912) 730-6495) ? [place] Assists [place] cancer patients and their families through emotional , educational and financial support.  720 114 2591 ? [place] DSS Where to apply for food stamps, Medicaid and utility assistance. 304-476-9600 ? [facility]: Transportation to medical appointments. 650-775-5036 ? Social Security Administration: May apply for disability if have a Stage IV cancer. 989-218-2333 419-814-2293 ? [employer], Disability and Transit Services: Assists with nutrition, care and transit needs. [place] Support Programs: '@10RELATIVEDAYS'$ @ > Cancer Support Group  2nd Tuesday of the month 1pm-2pm, Journey Room  > Creative Journey  3rd Tuesday of the month 1130am-1pm, Journey Room  > Look Good Feel Better  1st Wednesday of the month 10am-12 noon, Journey Room (Call [place] to register [phone])

## 2016-01-09 NOTE — Progress Notes (Signed)
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[place])   09/29/1993 - 05/14/1994 Chemotherapy     AC Q 3 weeks X 6 cycles      01/02/1994 Surgery    Left modified radical mastectomy      01/02/1994 Pathology Results    ER-, PR - with a single positive LN found in the L axillae, Stage II disease      07/22/2015 Imaging    Bone Scan, No metastatic pattern uptake, degenerative changes in the lumbar spine with dextroscoliosis       Melanoma ([place])   07/09/2013 Initial Biopsy    Initial biopsy L upper thigh/buttocks melanoma      07/20/2013 Surgery    Excision L lateral buttock/upper thigh melanoma, clear margins      07/20/2013 Pathology Results    Breslow depth 1.02 mm, pT2a        HISTORY OF PRESENTING ILLNESS:  Ashley [name] 65 y.o. female is here because of Essential thrombocytosis. She also has a history of breast cancer and melanoma.   Ashley [name] is accompanied by her daughter and presents in wheelchair.  Her PCP is in [place].  She had three lumbar discs and spinal stenosis surgery with neurosurgeon, Dr. Lynann Bologna. She did not have a lot of problems with that particular surgery. She continues to experience stabbing pain in her legs but it is getting better. Her back surgery was 8/2 and 8/3.    She developed congestive heart failure, pneumonia and respiratory failure but it was believed this was due to fluid overload. She was admitted to the hospital on 12/26/15 with shortness of breath and hypotension. She was discharged on 12/27/15.  She had an echocardiogram last Wednesday and will have a CT this Wednesday with a follow up in cardiology with Lyda Jester, PA-C on 01/12/16. Her daughter reports her mother complains of fatigue, nausea, vision changes, loss of appetite, and weakness.  The  patient has never underwent an EGD. The nausea began after her back surgery - this surgery was on August 2nd and August 3rd. This nausea has limited her appetite. She has lost 26 lbs since 11/21/15.  The highest her platelets ever got was in the high 800s. She underwent a bone marrow biopsy about a year and a half ago with Dr. Tressie Stalker.She does well with her Hydrea, with no problems at all. She recently had this refilled.  Her daughter states her mother is not nearly as active as she used to be and is tired and weak.   She smokes 2 to 3 cigarettes daily.  Her last mammogram was this past February. She gets these at here.  Her last colonoscopy was within the last 2 to 3 years and she was told she was good for another 5 years.   She last saw Dr. Tressie Stalker on 08/16/2015 for her ET:   MEDICAL HISTORY:  Past Medical History:  Diagnosis Date  . Arthritis   . Ascending aortic aneurysm ([place])   . Cancer [facility]) 1995   breast, left, mastectomy/chemo  . Chest pain    Possibly cardiac. No evidence of ischemia/injury based upon normal troponin I. Chest discomfort could be tachycardia induced supply demand mismatch.   . Colon adenomas   . Coronary artery disease   . Essential hypertension, benign   . GERD (  gastroesophageal reflux disease)   . Hyperlipidemia   . Hypertension   . Palpitations   . Pure hypercholesterolemia   . Thrombocytosis ([place])    Idiopathic    SURGICAL HISTORY: Past Surgical History:  Procedure Laterality Date  . ANTERIOR AND POSTERIOR REPAIR N/A 12/09/2014   Procedure: ANTERIOR (CYSTOCELE) AND POSTERIOR REPAIR (RECTOCELE);  Surgeon: Bjorn Loser, MD;  Location: [facility];  Service: Urology;  Laterality: N/A;  . ANTERIOR LAT LUMBAR FUSION Left 11/16/2015   Procedure: LEFT SIDED LATERAL INTERBODY FUSION, LUMBAR 2-3, LUMBAR 3-4, LUMBAR 4-5 WITH INSTRUMENTATION;  Surgeon: Phylliss Bob, MD;  Location: [facility];  Service: Orthopedics;  Laterality: Left;  LEFT SIDED LATEARL INTERBODY  FUSION, LUMBAR 2-3, LUMBAR 3-4, LUMBAR 4-5 WITH INSTRUMENTATION   . APPENDECTOMY    . BONE MARROW ASPIRATION  07/2012  . BONE MARROW BIOPSY  07/2012  . BREAST SURGERY    . CARDIAC CATHETERIZATION    . COLONOSCOPY  11/29/2010   Procedure: COLONOSCOPY;  Surgeon: Rogene Houston, MD;  [facility];  Service: Endoscopy;  Laterality: N/A;  . COLONOSCOPY N/A 02/18/2014   Procedure: COLONOSCOPY;  Surgeon: Rogene Houston, MD;  [facility];  Service: Endoscopy;  Laterality: N/A;  1030  . CYSTOSCOPY N/A 12/09/2014   Procedure: CYSTOSCOPY;  Surgeon: Bjorn Loser, MD;  Location: [facility];  Service: Urology;  Laterality: N/A;  . MASTECTOMY     left  . OVARIAN CYST SURGERY     x2  . SALPINGOOPHORECTOMY Bilateral 12/09/2014   Procedure: SALPINGO OOPHORECTOMY;  Surgeon: Servando Salina, MD;  Location: [facility];  Service: Gynecology;  Laterality: Bilateral;  . TUBAL LIGATION    . VAGINAL HYSTERECTOMY N/A 12/09/2014   Procedure: HYSTERECTOMY VAGINAL;  Surgeon: Servando Salina, MD;  Location: [facility];  Service: Gynecology;  Laterality: N/A;    SOCIAL HISTORY: Social History   Social History  . Marital status: Divorced    Spouse name: N/A  . Number of children: 2  . Years of education: N/A   Occupational History  . Not on file.   Social History Main Topics  . Smoking status: Current Every Day Smoker    Packs/day: 0.50    Years: 50.00    Types: Cigarettes  . Smokeless tobacco: Never Used  . Alcohol use No  . Drug use: No  . Sexual activity: Yes    Birth control/ protection: Post-menopausal   Other Topics Concern  . Not on file   Social History Narrative  . No narrative on file   Divorced 2 children 4 grandchildren 1 great grandchild Smoker ETOH, social. Daughter notes 3 a year. She has no hobbies right now. She does like yard work. 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 Hypertension Mother   . Heart failure Mother   . Congestive Heart Failure Mother   . COPD Mother   . Pernicious anemia Mother   . Cancer Mother     lung  . Hypertension Father   . CAD Father   . Heart attack Father   . Hypertension Sister   . Cancer Other   . Celiac disease Other    Mother is still living at 39 yo, smokes, and weighs 87 lbs. Father deceased at 63 yo of heart attack 1 sister, she has high blood pressure  ALLERGIES:  is allergic to  penicillins and tape.  MEDICATIONS:  Current Outpatient Prescriptions  Medication Sig Dispense Refill  . Calcium Carb-Cholecalciferol (CALCIUM 600+D) 600-800 MG-UNIT TABS Take 1 tablet by mouth 2 (two) times daily.    . Cholecalciferol (VITAMIN D-3) 1000 units CAPS Take 1 capsule by mouth daily.    . diazepam (VALIUM) 5 MG tablet Take 5 mg by mouth every 6 (six) hours as needed for muscle spasms.    Marland Kitchen gabapentin (NEURONTIN) 300 MG capsule Take 300 mg by mouth at bedtime.    . hydroxyurea (HYDREA) 500 MG capsule 500 mg PO Mon and Tues and 1000 mg PO Wed- Sun (Patient taking differently: Take 500-1,000 mg by mouth See admin instructions. 1,000 mg on Sun/Wed/Thurs/Fri/Sat and 500 mg on Mon/Tues) 50 capsule 0  . ipratropium-albuterol (DUONEB) 0.5-2.5 (3) MG/3ML SOLN Take 3 mLs by nebulization every 6 (six) hours as needed (shortness of breath). 360 mL 0  . methocarbamol (ROBAXIN) 500 MG tablet Take 1 tablet (500 mg total) by mouth every 6 (six) hours as needed for muscle spasms. 60 tablet 0  . omeprazole (PRILOSEC OTC) 20 MG tablet Take 20 mg by mouth daily.    . ondansetron (ZOFRAN-ODT) 4 MG disintegrating tablet Take 4 mg by mouth 3 (three) times daily as needed for nausea or vomiting.    . rosuvastatin (CRESTOR) 40 MG tablet Take 40 mg by mouth daily.      . traMADol (ULTRAM) 50 MG tablet Take one to two (1 - 2) tablets (50 mg - 100 mg total) by mouth three (3) times daily as needed for pain.    Marland Kitchen  aspirin EC 81 MG tablet Take 81 mg by mouth daily.    Marland Kitchen diltiazem (CARDIZEM CD) 240 MG 24 hr capsule Take 240 mg by mouth daily.      Marland Kitchen dronabinol (MARINOL) 2.5 MG capsule Take 1 capsule (2.5 mg total) by mouth 2 (two) times daily before a meal. 60 capsule 2  . furosemide (LASIX) 40 MG tablet Take 1 tablet (40 mg total) by mouth daily as needed for edema. (Patient not taking: Reported on 01/09/2016) 30 tablet 0  . ibandronate (BONIVA) 150 MG tablet Take 1 tablet by mouth every 30 (thirty) days.    . irbesartan-hydrochlorothiazide (AVALIDE) 150-12.5 MG tablet Take 1 tablet by mouth daily. Please hold if blood pressure less than 140/90. (Patient not taking: Reported on 01/09/2016) 30 tablet 0  . isosorbide mononitrate (IMDUR) 60 MG 24 hr tablet Take 60 mg by mouth daily.      Marland Kitchen oxyCODONE-acetaminophen (PERCOCET/ROXICET) 5-325 MG tablet Take 1-2 tablets by mouth every 6 (six) hours as needed for moderate pain or severe pain.     No current facility-administered medications for this visit.     Review of Systems  Constitutional: Positive for malaise/fatigue and weight loss (26 lbs since 11/21/15).       Loss of appetite  HENT: Negative.   Eyes: Negative.        Vision changes  Respiratory: Positive for shortness of breath.   Cardiovascular: Negative.   Gastrointestinal: Positive for nausea (since back surgery).       Persistent nausea  Genitourinary: Negative.   Musculoskeletal: Negative.   Skin: Negative.   Neurological: Positive for weakness.  Endo/Heme/Allergies: Negative.   Psychiatric/Behavioral: Negative.   All other systems reviewed and are negative.  14 point ROS was done and is otherwise as detailed above or in HPI   PHYSICAL EXAMINATION: ECOG PERFORMANCE STATUS: 2 - Symptomatic, <50% confined to  bed  Vitals:   01/09/16 1504  BP: 117/90  Pulse: 78  Resp: 16  Temp: 97.3 F (36.3 C)   Filed Weights   01/09/16 1504  Weight: 119 lb (54 kg)   Physical Exam  Constitutional:  She is oriented to person, place, and time and well-developed, well-nourished, and in no distress.  In wheelchair. Able to get on exam table with assistance.  HENT:  Head: Normocephalic and atraumatic.  Nose: Nose normal.  Mouth/Throat: Oropharynx is clear and moist. No oropharyngeal exudate.  Eyes: Conjunctivae and EOM are normal. Pupils are equal, round, and reactive to light. Right eye exhibits no discharge. Left eye exhibits no discharge. No scleral icterus.  Neck: Normal range of motion. Neck supple. No tracheal deviation present. No thyromegaly present.  Cardiovascular: Normal rate, regular rhythm and normal heart sounds.  Exam reveals no gallop and no friction rub.   No murmur heard. Pulmonary/Chest: Effort normal and breath sounds normal. She has no wheezes. She has no rales.  Occasional rhonchi which clears with cough  Abdominal: Soft. Bowel sounds are normal. She exhibits no distension and no mass. There is no tenderness. There is no rebound and no guarding.  Musculoskeletal: Normal range of motion. She exhibits no edema.  Lymphadenopathy:    She has no cervical adenopathy.  Neurological: She is alert and oriented to person, place, and time. No cranial nerve deficit. Coordination normal.  Needs assistance onto exam table  Skin: Skin is warm and dry. No rash noted.  Psychiatric: Mood, memory, affect and judgment normal.  Nursing note and vitals reviewed.   LABORATORY DATA:  I have reviewed the data as listed Lab Results  Component Value Date   WBC 8.4 12/26/2015   HGB 11.5 (L) 12/26/2015   HCT 35.0 (L) 12/26/2015   MCV 104.5 (H) 12/26/2015   PLT 417 (H) 12/26/2015   CMP     Component Value Date/Time   NA 133 (L) 12/26/2015 1400   K 3.3 (L) 12/26/2015 1400   CL 94 (L) 12/26/2015 1400   CO2 26 12/26/2015 1400   GLUCOSE 119 (H) 12/26/2015 1400   BUN 16 12/26/2015 1400   CREATININE 1.04 (H) 12/26/2015 1400   CALCIUM 9.9 12/26/2015 1400   PROT 7.1 12/16/2015 1337    ALBUMIN 3.7 12/16/2015 1337   AST 21 12/16/2015 1337   ALT 11 (L) 12/16/2015 1337   ALKPHOS 78 12/16/2015 1337   BILITOT 0.6 12/16/2015 1337   GFRNONAA 54 (L) 12/26/2015 1400   GFRAA >60 12/26/2015 1400          RADIOGRAPHIC STUDIES: I have personally reviewed the radiological images as listed and agreed with the findings in the report. Study Result   CLINICAL DATA:  Lumbar fusion 4 weeks ago, short of breath off and on since surgery, weakness, has not felt well, smoker, coronary artery disease, COPD, CHF  EXAM: CHEST  2 VIEW  COMPARISON:  11/18/2015  FINDINGS: Normal heart size, mediastinal contours, and pulmonary vascularity.  Lungs emphysematous but clear.  No infiltrate, pleural effusion, or pneumothorax.  Bones demineralized with evidence of prior lumbar fusion.  IMPRESSION: COPD changes without acute infiltrate.   Electronically Signed   By: Lavonia Dana M.D.   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 Tressie Stalker on 08/16/15. She is to continue her current hydrea dose. Goal  platelet count is < 450K. I do not see CALR, MPL, JAK 2 testing and we can order this at folllow-up. She has labs from her PCP, Angelina Ok dated 9/21 and platelet count was 499. She may need an adjustment on her hydrea. Given her current nausea, weight loss, I plan on bringing her back in 6 weeks with repeat labs and we can decide at that point.   She will sign a release so I can obtain her BMBX.   I have referred the patient to Dr. Laural Golden to investigate her nausea and decreased appetite.  Her nausea began after her back surgery on August 2 to August 3rd. She is established with him.   I have written a prescription for marinol to stimulate appetite.   She will return for follow up in 6 weeks.     ORDERS PLACED FOR  THIS ENCOUNTER: Orders Placed This Encounter  Procedures  . CBC with Differential  . Comprehensive metabolic panel  . JAK2 V617F, Rfx CALR/E12/MPL    MEDICATIONS PRESCRIBED THIS ENCOUNTER: Meds ordered this encounter  Medications  . dronabinol (MARINOL) 2.5 MG capsule    Sig: Take 1 capsule (2.5 mg total) by mouth 2 (two) times daily before a meal.    Dispense:  60 capsule    Refill:  2   All questions were answered. The patient knows to call the clinic with any problems, questions or concerns.  I spent 60 minutes counseling the patient face to face. The total time spent in the appointment was 80 minutes and more than 50% was on counseling and review of test results  This document serves as a record of services personally performed by Ancil Linsey, MD. It was created on her behalf by Arlyce Harman, a trained medical scribe. The creation of this record is based on the scribe's personal observations and the provider's statements to them. This document has been checked and approved by the attending provider.  I have reviewed the above documentation for accuracy and completeness and I agree with the above.  This note was electronically signed.    Molli Hazard, MD  01/10/2016 5:47 PM

## 2016-01-10 ENCOUNTER — Encounter (HOSPITAL_COMMUNITY): Payer: Self-pay | Admitting: Hematology & Oncology

## 2016-01-11 ENCOUNTER — Other Ambulatory Visit (HOSPITAL_COMMUNITY): Payer: Self-pay | Admitting: Cardiology

## 2016-01-11 ENCOUNTER — Ambulatory Visit (HOSPITAL_COMMUNITY)
Admission: RE | Admit: 2016-01-11 | Discharge: 2016-01-11 | Disposition: A | Discharge status: Home / Self Care | Payer: Medicare Other | Source: Ambulatory Visit | Attending: Cardiology | Admitting: Cardiology

## 2016-01-11 ENCOUNTER — Encounter (HOSPITAL_COMMUNITY): Payer: Self-pay | Admitting: General Surgery

## 2016-01-11 DIAGNOSIS — R918 Other nonspecific abnormal finding of lung field: Secondary | ICD-10-CM | POA: Insufficient documentation

## 2016-01-11 DIAGNOSIS — I77810 Thoracic aortic ectasia: Secondary | ICD-10-CM | POA: Diagnosis not present

## 2016-01-11 DIAGNOSIS — T148 Other injury of unspecified body region: Secondary | ICD-10-CM | POA: Insufficient documentation

## 2016-01-11 DIAGNOSIS — I700 Atherosclerosis of aorta: Secondary | ICD-10-CM | POA: Insufficient documentation

## 2016-01-11 DIAGNOSIS — J438 Other emphysema: Secondary | ICD-10-CM | POA: Insufficient documentation

## 2016-01-11 DIAGNOSIS — D473 Essential (hemorrhagic) thrombocythemia: Secondary | ICD-10-CM | POA: Diagnosis not present

## 2016-01-11 DIAGNOSIS — R0602 Shortness of breath: Secondary | ICD-10-CM | POA: Insufficient documentation

## 2016-01-11 DIAGNOSIS — I2510 Atherosclerotic heart disease of native coronary artery without angina pectoris: Secondary | ICD-10-CM | POA: Diagnosis not present

## 2016-01-11 DIAGNOSIS — T148XXA Other injury of unspecified body region, initial encounter: Secondary | ICD-10-CM

## 2016-01-11 HISTORY — PX: IR GENERIC HISTORICAL: IMG1180011

## 2016-01-11 MED ORDER — IOPAMIDOL (ISOVUE-370) INJECTION 76%
INTRAVENOUS | Status: AC
  Administered 2016-01-11: 80 mL
  Filled 2016-01-11: qty 100

## 2016-01-11 MED ORDER — NITROGLYCERIN 0.4 MG SL SUBL
SUBLINGUAL_TABLET | SUBLINGUAL | Status: AC
  Filled 2016-01-11: qty 2

## 2016-01-11 MED ORDER — NITROGLYCERIN 0.4 MG SL SUBL
0.8000 mg | SUBLINGUAL_TABLET | Freq: Once | SUBLINGUAL | Status: AC
  Administered 2016-01-11: 0.8 mg via SUBLINGUAL

## 2016-01-11 MED ORDER — LIDOCAINE HCL 1 % IJ SOLN
INTRAMUSCULAR | Status: AC
  Filled 2016-01-11: qty 20

## 2016-01-12 ENCOUNTER — Encounter (HOSPITAL_COMMUNITY): Payer: Self-pay | Admitting: General Surgery

## 2016-01-12 ENCOUNTER — Ambulatory Visit (INDEPENDENT_AMBULATORY_CARE_PROVIDER_SITE_OTHER): Payer: Medicare Other | Admitting: Cardiology

## 2016-01-12 ENCOUNTER — Ambulatory Visit: Payer: Medicare Other | Admitting: Cardiology

## 2016-01-12 VITALS — BP 112/70 | HR 88 | Ht 63.0 in | Wt 118.0 lb

## 2016-01-12 DIAGNOSIS — I429 Cardiomyopathy, unspecified: Secondary | ICD-10-CM | POA: Diagnosis not present

## 2016-01-12 NOTE — Patient Instructions (Addendum)
Medication Instructions:   Your physician recommends that you continue on your current medications as directed. Please refer to the Current Medication list given to you today.    If you need a refill on your cardiac medications before your next appointment, please call your pharmacy.  Labwork: NONE ORDER TODAY    Testing/Procedures: NONE ORDER TODAY    Follow-Up: YOU WILL BE CONTACTED BACK WITH NEXT STEPS.Marland KitchenAFTER RESULTS TO DETERMINE HEART CATHERIZATION   Any Other Special Instructions Will Be Listed Below (If Applicable).

## 2016-01-12 NOTE — Progress Notes (Signed)
01/12/2016 Ashley [name] Ashley [name]   1950-05-31  [id number]  Primary Physician [name], NP Primary Cardiologist: Dr. Angelena Form   Reason for Visit/CC: F/u for Dyspnea; Fatigue  HPI:   65 yo female with history of HTN, HLD, tobacco abuse, GERD, breast cancer. She was seen by Dr. Angelena Form as a new patient 11/08/15 for cardiac evaluation. She has no known heart disease. She had a cardiac cath in 1997 and reports no CAD. She was told she may have had coronary spasms.She has an ascending aortic aneurysm followed in primary care. Last CTA in Sept 2016 with 4.2 cm ascending aorta. She has smoked 1/2 ppd for 45 years.   She was recently seen by Dr. Angelena Form for preoperative clearance prior to undergoing back surgery for spinal stenosis. She denied CP but given her risk factors, he ordered for her to undergo a NST to exclude ischemia. This was performed 11/09/15. The study was normal w/o ischemia. EF was 63%. She was cleared for surgery which was preformed 11/16/15 w/o any serious complications. Her post op recovery was complicated by hypoxia. IM was consulted. She underwent chest CT 11/19/15 which showed no PE. There was Atherosclerosis of the thoracic aorta without dissection. Unchanged ascending thoracic aortic aneurysm measuring 4.2 x 4.0 cm. Coronary artery calcifications were seen.  CXR did suggest new onset cardiomegaly with increasing pulmonary vascular congestion. This was after receiving perioperative IVFs, ~4.3 L over a 2 day period. Fluids were stopped and she was placed on IV Lasix. Echo showed normal LVEF of 55-60%. Wall motion was normal. Left ventricular diastolic function parameters were normal. Valve function was normal. She was discharged on 11/16/15.  I saw her on 12/28/2015 with new complaints of unexplanined dyspnea, fatigue, anorexia, weight loss, hypotension and intermitted CP and mid scapular pain. She was seen in the ED, the day prior to visit and CXR was neg for evidence of cardiovascular and  pulmonary disease. D dimer was negative, BNP was negative. She had no evidence of respiratory distress in the ED and had normal oxygen saturations on room air. She remained free of chest pain, troponin was negative with a normal EKG. She was given 500 ml bolus of NS in the ED with some improvement in her symptoms. Her blood pressures remained stable with some improvement after IV fluids. She was sent home from ED and did not require admission.   When I evaluated her, she noted that he still felt poorly. Still with symptoms of unexplanined dyspnea, fatigue, anorexia, weight loss, hypotension and intermitted CP.  EKG was non acute. I ordered a 2D echo to reassess LVEF and to r/o pericardial effusion. Given her symptoms of CP and dyspnea,  + risk factors (heavy smoker + family history with father dying suddenly from MI at age 50), to order a coronary CTA to better assess for underlying CAD.   2D echo shows recent drop in EF, now at 40-45%, down from 50-60%. No pericardial effusion. Coronary CTA was read by Dr. Meda Coffee. Results are as follows:   Aorta: Aneurysmal dilatation of the ascending aorta as described in the radiology overread.  Aortic Valve:  Trileaflet.  Minimal calcifications.  Coronary Arteries:  Normal origin.  Right dominance.  RCA is a very large dominant artery that gives rise to PDA and PLVB. There is mild calcified plaque in the proximal portion associated with 0-25% stenosis. There is minimal non-calcified plaque in the mid RCA associated with 0-25% stenosis.  Minimal calcifications in the distal RCA are associated  with 0-25% stenosis.  Left main is a large artery that has mild ostial calcified plaque with associated stenosis 25-50%.  LAD is a large artery that gives rise to two diagonal branches. LAD has a moderate non-calcified plaque in the mid portion just after the takeoff of the 1. diagonal and septal branch associated with 50-69% stenosis. There is minimal  calcified plaque in the distal LAD associated with 0-25% stenosis.  LCX is a moderate size artery that gives rise to two small OM branches. There is a moderate circumferential calcified plaque in the mid LCX artery associated with 50-69% stenosis.  IMPRESSION: 1. Coronary calcium score of 250. This was 31 percentile for age and sex matched control.  2. Normal coronary origin.  Right dominance.  3. 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 I would consider sending this study for further evaluation with CT FFR once available in our hospital.  Aggressive risk factor modification is recommended.  She is here today with her 2 daughters. They are very concerned about her decline and no improvement in symptoms. She is CP free. No resting dyspnea but she was short of breath earlier with ambulation.    Current Meds  Medication Sig  . aspirin EC 81 MG tablet Take 81 mg by mouth daily.  . Calcium Carb-Cholecalciferol (CALCIUM 600+D) 600-800 MG-UNIT TABS Take 1 tablet by mouth 2 (two) times daily.  . cephALEXin (KEFLEX) 500 MG capsule Take 500 mg by mouth 2 (two) times daily.  . Cholecalciferol (VITAMIN D-3) 1000 units CAPS Take 1 capsule by mouth daily.  . diazepam (VALIUM) 5 MG tablet Take 5 mg by mouth every 6 (six) hours as needed for muscle spasms.  Marland Kitchen diltiazem (CARDIZEM CD) 240 MG 24 hr capsule Take 240 mg by mouth daily.    Marland Kitchen dronabinol (MARINOL) 2.5 MG capsule Take 1 capsule (2.5 mg total) by mouth 2 (two) times daily before a meal.  . furosemide (LASIX) 40 MG tablet Take 1 tablet (40 mg total) by mouth daily as needed for edema.  . gabapentin (NEURONTIN) 300 MG capsule Take 300 mg by mouth at bedtime.  . hydroxyurea (HYDREA) 500 MG capsule 500 mg PO Mon and Tues and 1000 mg PO Wed- Sun (Patient taking differently: Take 500-1,000 mg by mouth See admin instructions. 1,000 mg on Sun/Wed/Thurs/Fri/Sat and 500 mg on Mon/Tues)  . ibandronate (BONIVA) 150 MG tablet Take 1 tablet  by mouth every 30 (thirty) days.  Marland Kitchen ipratropium-albuterol (DUONEB) 0.5-2.5 (3) MG/3ML SOLN Take 3 mLs by nebulization every 6 (six) hours as needed (shortness of breath).  . irbesartan-hydrochlorothiazide (AVALIDE) 150-12.5 MG tablet Take 1 tablet by mouth daily. Please hold if blood pressure less than 140/90.  Marland Kitchen isosorbide mononitrate (IMDUR) 60 MG 24 hr tablet Take 60 mg by mouth daily.    . methocarbamol (ROBAXIN) 500 MG tablet Take 1 tablet (500 mg total) by mouth every 6 (six) hours as needed for muscle spasms.  Marland Kitchen ofloxacin (OCUFLOX) 0.3 % ophthalmic solution Place 1 drop into both eyes every 2 (two) hours.  Marland Kitchen omeprazole (PRILOSEC OTC) 20 MG tablet Take 20 mg by mouth daily.  . ondansetron (ZOFRAN-ODT) 4 MG disintegrating tablet Take 4 mg by mouth 3 (three) times daily as needed for nausea or vomiting.  Marland Kitchen oxyCODONE-acetaminophen (PERCOCET/ROXICET) 5-325 MG tablet Take 1-2 tablets by mouth every 6 (six) hours as needed for moderate pain or severe pain.  . rosuvastatin (CRESTOR) 40 MG tablet Take 40 mg by mouth daily.    Marland Kitchen  traMADol (ULTRAM) 50 MG tablet Take one to two (1 - 2) tablets (50 mg - 100 mg total) by mouth three (3) times daily as needed for pain.   Allergies  Allergen Reactions  . Penicillins Hives and Rash    Has patient had a PCN reaction causing immediate rash, facial/tongue/throat swelling, SOB or lightheadedness with hypotension: Yes Has patient had a PCN reaction causing severe rash involving mucus membranes or skin necrosis: Yes Has patient had a PCN reaction that required hospitalization No Has patient had a PCN reaction occurring within the last 10 years: Yes If all of the above answers are "NO", then may proceed with Cephalosporin use.   . Tape Rash    Medipore, Coban, and paper tape CAN be tolerated   Past Medical History:  Diagnosis Date  . Arthritis   . Ascending aortic aneurysm (HCC)   . Cancer (HCC) 1995   breast, left, mastectomy/chemo  . Chest pain     Possibly cardiac. No evidence of ischemia/injury based upon normal troponin I. Chest discomfort could be tachycardia induced supply demand mismatch.   . Colon adenomas   . Coronary artery disease   . Essential hypertension, benign   . GERD (gastroesophageal reflux disease)   . Hyperlipidemia   . Hypertension   . Palpitations   . Pure hypercholesterolemia   . Thrombocytosis (HCC)    Idiopathic   Family History  Problem Relation Age of Onset  . Hypertension Mother   . Heart failure Mother   . Congestive Heart Failure Mother   . COPD Mother   . Pernicious anemia Mother   . Cancer Mother     lung  . Hypertension Father   . CAD Father   . Heart attack Father   . Hypertension Sister   . Cancer Other   . Celiac disease Other    Past Surgical History:  Procedure Laterality Date  . ANTERIOR AND POSTERIOR REPAIR N/A 12/09/2014   Procedure: ANTERIOR (CYSTOCELE) AND POSTERIOR REPAIR (RECTOCELE);  Surgeon: Scott MacDiarmid, MD;  Location: [facility];  Service: Urology;  Laterality: N/A;  . ANTERIOR LAT LUMBAR FUSION Left 11/16/2015   Procedure: LEFT SIDED LATERAL INTERBODY FUSION, LUMBAR 2-3, LUMBAR 3-4, LUMBAR 4-5 WITH INSTRUMENTATION;  Surgeon: Mark Dumonski, MD;  Location: [facility];  Service: Orthopedics;  Laterality: Left;  LEFT SIDED LATEARL INTERBODY FUSION, LUMBAR 2-3, LUMBAR 3-4, LUMBAR 4-5 WITH INSTRUMENTATION   . APPENDECTOMY    . BONE MARROW ASPIRATION  07/2012  . BONE MARROW BIOPSY  07/2012  . BREAST SURGERY    . CARDIAC CATHETERIZATION    . COLONOSCOPY  11/29/2010   Procedure: COLONOSCOPY;  Surgeon: Najeeb U Rehman, MD;  Location: [facility];  Service: Endoscopy;  Laterality: N/A;  . COLONOSCOPY N/A 02/18/2014   Procedure: COLONOSCOPY;  Surgeon: Najeeb U Rehman, MD;  Location: [facility];  Service: Endoscopy;  Laterality: N/A;  1030  . CYSTOSCOPY N/A 12/09/2014   Procedure: CYSTOSCOPY;  Surgeon: Scott MacDiarmid, MD;  Location: [facility];  Service: Urology;  Laterality: N/A;  . IR  GENERIC HISTORICAL  01/11/2016   IR RADIOLOGY PERIPHERAL GUIDED IV START 01/11/2016 Kelly Osborne, PA-C [facility]  . IR GENERIC HISTORICAL  01/11/2016   IR US GUIDE VASC ACCESS RIGHT 01/11/2016 Kelly Osborne, PA-C [facility]  . MASTECTOMY     left  . OVARIAN CYST SURGERY     x2  . SALPINGOOPHORECTOMY Bilateral 12/09/2014   Procedure: SALPINGO OOPHORECTOMY;  Surgeon: Sheronette Cousins, MD;  Location: [facility];  Service: Gynecology;  Laterality: Bilateral;  . TUBAL LIGATION    . VAGINAL HYSTERECTOMY N/A 12/09/2014   Procedure: HYSTERECTOMY VAGINAL;  Surgeon: Sheronette Cousins, MD;  Location: [facility];  Service: Gynecology;  Laterality: N/A;   Social History   Social History  . Marital status: Divorced    Spouse name: N/A  . Number of children: 2  . Years of education: N/A   Occupational History  . Not on file.   Social History Main Topics  . Smoking status: Current Every Day Smoker    Packs/day: 0.50    Years: 50.00    Types: Cigarettes  . Smokeless tobacco: Never Used  . Alcohol use No  . Drug use: No  . Sexual activity: Yes    Birth control/ protection: Post-menopausal   Other Topics Concern  . Not on file   Social History Narrative  . No narrative on file     Review of Systems: General: negative for chills, fever, night sweats or weight changes.  Cardiovascular: negative for chest pain, dyspnea on exertion, edema, orthopnea, palpitations, paroxysmal nocturnal dyspnea or shortness of breath Dermatological: negative for rash Respiratory: negative for cough or wheezing Urologic: negative for hematuria Abdominal: negative for nausea, vomiting, diarrhea, bright red blood per rectum, melena, or hematemesis Neurologic: negative for visual changes, syncope, or dizziness All other systems reviewed and are otherwise negative except as noted above.   Physical Exam:  Blood pressure 112/70, pulse 88, height 5' 3" (1.6 m), weight 118 lb (53.5 kg).  General appearance: alert,  cooperative and thin/ frail appearing Neck: no carotid bruit and no JVD Lungs: clear to auscultation bilaterally Heart: regular rate and rhythm, S1, S2 normal, no murmur, click, rub or gallop Extremities: no LEE Pulses: 2+ and symmetric Skin: warm and dry Neurologic: Grossly normal   ASSESSMENT AND PLAN:   1. Pt still with symptoms of fatigue, decreased appetite, weight loss, dyspnea and intermitted chest and mid scapular pain. Her risk factor include HTN, HLD, heavy tobacco use and family h/o CAD (father died from MI at age 52). She recently had a NST 11/2015 as part of surgical clearance for back surgery that was low risk for ischemia and with EF of 63%. Following her back surgery, she developed the above symptoms and has not had any improvement. She had an echo at [facility] following her sugery that showed normal LVEF. I reordered an echo 12/2015 that showed reduction in EF down to 40-45%, previously 55-60%. No pericardial effusion. Recent Coronary CTA shows moderate CAD in the mid LAD and mid LCX arteries, 50-69%. ? further evaluation with CT FFR once available in our hospital, vs going ahead and proceeding with cath. Will route note, coronary CT and echo findings to Dr. McAlhany to review, to help make a decision regarding plan of care. I did discuss with patient the possibility of LHC including potential risk. Pt is in agreement to proceed if Dr. McAlhany agrees.     Nasreen Goedecke PA-C 01/12/2016 11:13 AM  

## 2016-01-13 ENCOUNTER — Telehealth: Payer: Self-pay | Admitting: *Deleted

## 2016-01-13 NOTE — Telephone Encounter (Signed)
-----   Message from Consuelo Pandy, [place] sent at 01/13/2016  3:38 PM EDT ----- Study shows moderate coronary disease is a few of your heart arteries. This has been routed to Dr. Angelena Form to review. We will notify you further if he feels that you will need a LHC.

## 2016-01-13 NOTE — Telephone Encounter (Signed)
SPOKE TO PT ABOUT RESULTS VERBALIZED UNDERSTANDING THAT WILL GET ANOTHER RETURN CALL ONCE [facility] HAS REVIEWED RESULTS TO DETERMINE ABOUT [facility] CATH

## 2016-01-16 ENCOUNTER — Telehealth: Payer: Self-pay | Admitting: *Deleted

## 2016-01-16 ENCOUNTER — Encounter: Payer: Self-pay | Admitting: *Deleted

## 2016-01-16 NOTE — Telephone Encounter (Signed)
Spoke with pt's dtr and reviewed cath instructions. She is aware letter of instructions will be available in MyChart. Dtr verbalized understanding and agreeable to plan.

## 2016-01-16 NOTE — Progress Notes (Signed)
Ashley [name], I have spoken to her and I have put her on the cath schedule for 9am on Friday October 6th. I will ask Triage to help me from the office end to arrange the cath since my nurse Fraser Din is off today.   Triage nurses, Can we call Ashley [name] on  Her home number to arrange cath/instructions?   I will place the cath orders.   Thanks,  Gerald Stabs

## 2016-01-16 NOTE — Telephone Encounter (Addendum)
Burnell Blanks, MD at 01/12/2016 11:00 AM   Status: Signed    Brittainy, I have spoken to her and I have put her on the cath schedule for 9am on Friday October 6th. I will ask Triage to help me from the office end to arrange the cath since my nurse Fraser Din is off today.   Triage nurses, Can we call Ms. Peek on  Her home number to arrange cath/instructions?   I will place the cath orders.   Thanks,  Gerald Stabs

## 2016-01-16 NOTE — Addendum Note (Signed)
Addended by: Lauree Chandler D on: 01/16/2016 04:23 PM   Modules accepted: Orders, SmartSet

## 2016-01-20 ENCOUNTER — Ambulatory Visit (HOSPITAL_COMMUNITY)
Admission: RE | Admit: 2016-01-20 | Discharge: 2016-01-20 | Disposition: A | Discharge status: Home / Self Care | Payer: Medicare Other | Source: Ambulatory Visit | Attending: Cardiovascular Disease | Admitting: Cardiovascular Disease

## 2016-01-20 ENCOUNTER — Encounter (HOSPITAL_COMMUNITY): Payer: Self-pay | Admitting: Cardiovascular Disease

## 2016-01-20 ENCOUNTER — Encounter (HOSPITAL_COMMUNITY)
Admission: RE | Disposition: A | Discharge status: Home / Self Care | Payer: Self-pay | Source: Ambulatory Visit | Attending: Cardiovascular Disease

## 2016-01-20 DIAGNOSIS — R0600 Dyspnea, unspecified: Secondary | ICD-10-CM

## 2016-01-20 DIAGNOSIS — Z880 Allergy status to penicillin: Secondary | ICD-10-CM | POA: Insufficient documentation

## 2016-01-20 DIAGNOSIS — I2510 Atherosclerotic heart disease of native coronary artery without angina pectoris: Secondary | ICD-10-CM | POA: Insufficient documentation

## 2016-01-20 DIAGNOSIS — I10 Essential (primary) hypertension: Secondary | ICD-10-CM | POA: Insufficient documentation

## 2016-01-20 DIAGNOSIS — I712 Thoracic aortic aneurysm, without rupture: Secondary | ICD-10-CM | POA: Diagnosis not present

## 2016-01-20 DIAGNOSIS — E7800 Pure hypercholesterolemia, unspecified: Secondary | ICD-10-CM | POA: Diagnosis not present

## 2016-01-20 DIAGNOSIS — K219 Gastro-esophageal reflux disease without esophagitis: Secondary | ICD-10-CM | POA: Diagnosis not present

## 2016-01-20 DIAGNOSIS — Z853 Personal history of malignant neoplasm of breast: Secondary | ICD-10-CM | POA: Diagnosis not present

## 2016-01-20 DIAGNOSIS — Z801 Family history of malignant neoplasm of trachea, bronchus and lung: Secondary | ICD-10-CM | POA: Diagnosis not present

## 2016-01-20 DIAGNOSIS — Z8249 Family history of ischemic heart disease and other diseases of the circulatory system: Secondary | ICD-10-CM | POA: Insufficient documentation

## 2016-01-20 DIAGNOSIS — I428 Other cardiomyopathies: Secondary | ICD-10-CM | POA: Insufficient documentation

## 2016-01-20 DIAGNOSIS — M1990 Unspecified osteoarthritis, unspecified site: Secondary | ICD-10-CM | POA: Insufficient documentation

## 2016-01-20 DIAGNOSIS — I429 Cardiomyopathy, unspecified: Secondary | ICD-10-CM

## 2016-01-20 DIAGNOSIS — E785 Hyperlipidemia, unspecified: Secondary | ICD-10-CM | POA: Diagnosis not present

## 2016-01-20 DIAGNOSIS — F17210 Nicotine dependence, cigarettes, uncomplicated: Secondary | ICD-10-CM | POA: Diagnosis not present

## 2016-01-20 DIAGNOSIS — Z7982 Long term (current) use of aspirin: Secondary | ICD-10-CM | POA: Diagnosis not present

## 2016-01-20 HISTORY — PX: CARDIAC CATHETERIZATION: SHX172

## 2016-01-20 LAB — CBC WITH DIFFERENTIAL/PLATELET
BASOS ABS: 0 10*3/uL (ref 0.0–0.1)
Basophils Relative: 0 %
EOS ABS: 0.1 10*3/uL (ref 0.0–0.7)
EOS PCT: 3 %
HCT: 31.7 % — ABNORMAL LOW (ref 36.0–46.0)
HEMOGLOBIN: 10.5 g/dL — AB (ref 12.0–15.0)
LYMPHS ABS: 1.5 10*3/uL (ref 0.7–4.0)
LYMPHS PCT: 30 %
MCH: 34.1 pg — ABNORMAL HIGH (ref 26.0–34.0)
MCHC: 33.1 g/dL (ref 30.0–36.0)
MCV: 102.9 fL — ABNORMAL HIGH (ref 78.0–100.0)
Monocytes Absolute: 0.3 10*3/uL (ref 0.1–1.0)
Monocytes Relative: 7 %
NEUTROS PCT: 60 %
Neutro Abs: 2.9 10*3/uL (ref 1.7–7.7)
PLATELETS: 293 10*3/uL (ref 150–400)
RBC: 3.08 MIL/uL — AB (ref 3.87–5.11)
RDW: 16.6 % — ABNORMAL HIGH (ref 11.5–15.5)
WBC: 4.9 10*3/uL (ref 4.0–10.5)

## 2016-01-20 LAB — BASIC METABOLIC PANEL
ANION GAP: 8 (ref 5–15)
BUN: 7 mg/dL (ref 6–20)
CHLORIDE: 106 mmol/L (ref 101–111)
CO2: 25 mmol/L (ref 22–32)
Calcium: 9.4 mg/dL (ref 8.9–10.3)
Creatinine, Ser: 0.71 mg/dL (ref 0.44–1.00)
GFR calc Af Amer: >60 mL/min (ref >60)
Glucose, Bld: 103 mg/dL — ABNORMAL HIGH (ref 65–99)
POTASSIUM: 3 mmol/L — AB (ref 3.5–5.1)
SODIUM: 139 mmol/L (ref 135–145)

## 2016-01-20 LAB — PROTIME-INR
INR: 1.14
Prothrombin Time: 14.6 seconds (ref 11.4–15.2)

## 2016-01-20 SURGERY — LEFT HEART CATH AND CORONARY ANGIOGRAPHY

## 2016-01-20 MED ORDER — FENTANYL CITRATE (PF) 100 MCG/2ML IJ SOLN
INTRAMUSCULAR | Status: AC
  Filled 2016-01-20: qty 2

## 2016-01-20 MED ORDER — HEPARIN (PORCINE) IN NACL 2-0.9 UNIT/ML-% IJ SOLN
INTRAMUSCULAR | Status: DC | PRN
  Administered 2016-01-20: 1000 mL via INTRA_ARTERIAL

## 2016-01-20 MED ORDER — HEPARIN SODIUM (PORCINE) 1000 UNIT/ML IJ SOLN
INTRAMUSCULAR | Status: AC
  Filled 2016-01-20: qty 1

## 2016-01-20 MED ORDER — VERAPAMIL HCL 2.5 MG/ML IV SOLN
INTRAVENOUS | Status: AC
  Filled 2016-01-20: qty 2

## 2016-01-20 MED ORDER — SODIUM CHLORIDE 0.9% FLUSH: 3.0000 mL | Freq: Two times a day (BID) | INTRAVENOUS | Status: DC

## 2016-01-20 MED ORDER — SODIUM CHLORIDE 0.9 % IV SOLN
INTRAVENOUS | Status: AC
  Administered 2016-01-20: 08:00:00 via INTRAVENOUS

## 2016-01-20 MED ORDER — HEPARIN SODIUM (PORCINE) 1000 UNIT/ML IJ SOLN
INTRAMUSCULAR | Status: DC | PRN
  Administered 2016-01-20: 3000 [IU] via INTRAVENOUS

## 2016-01-20 MED ORDER — MIDAZOLAM HCL 2 MG/2ML IJ SOLN
INTRAMUSCULAR | Status: AC
  Filled 2016-01-20: qty 2

## 2016-01-20 MED ORDER — SODIUM CHLORIDE 0.9 % IV SOLN: 250.0000 mL | INTRAVENOUS | Status: DC | PRN

## 2016-01-20 MED ORDER — LIDOCAINE HCL (PF) 1 % IJ SOLN
INTRAMUSCULAR | Status: AC
  Filled 2016-01-20: qty 30

## 2016-01-20 MED ORDER — SODIUM CHLORIDE 0.9% FLUSH: 3.0000 mL | INTRAVENOUS | Status: DC | PRN

## 2016-01-20 MED ORDER — SODIUM CHLORIDE 0.9 % IV SOLN: INTRAVENOUS | Status: AC

## 2016-01-20 MED ORDER — POTASSIUM CHLORIDE CRYS ER 20 MEQ PO TBCR
60.0000 meq | EXTENDED_RELEASE_TABLET | Freq: Once | ORAL | Status: AC
  Administered 2016-01-20: 60 meq via ORAL

## 2016-01-20 MED ORDER — POTASSIUM CHLORIDE CRYS ER 20 MEQ PO TBCR
EXTENDED_RELEASE_TABLET | ORAL | Status: AC
  Administered 2016-01-20: 60 meq via ORAL
  Filled 2016-01-20: qty 3

## 2016-01-20 MED ORDER — FENTANYL CITRATE (PF) 100 MCG/2ML IJ SOLN
INTRAMUSCULAR | Status: DC | PRN
  Administered 2016-01-20 (×2): 25 ug via INTRAVENOUS

## 2016-01-20 MED ORDER — VERAPAMIL HCL 2.5 MG/ML IV SOLN
INTRAVENOUS | Status: DC | PRN
  Administered 2016-01-20: 10 mL via INTRA_ARTERIAL

## 2016-01-20 MED ORDER — LIDOCAINE HCL (PF) 1 % IJ SOLN
INTRAMUSCULAR | Status: DC | PRN
  Administered 2016-01-20: 2 mL via INTRADERMAL

## 2016-01-20 MED ORDER — ASPIRIN 81 MG PO CHEW
CHEWABLE_TABLET | ORAL | Status: AC
  Filled 2016-01-20: qty 1

## 2016-01-20 MED ORDER — HEPARIN (PORCINE) IN NACL 2-0.9 UNIT/ML-% IJ SOLN
INTRAMUSCULAR | Status: AC
  Filled 2016-01-20: qty 1000

## 2016-01-20 MED ORDER — ASPIRIN 81 MG PO CHEW
81.0000 mg | CHEWABLE_TABLET | ORAL | Status: AC
  Administered 2016-01-20: 81 mg via ORAL

## 2016-01-20 MED ORDER — MIDAZOLAM HCL 2 MG/2ML IJ SOLN
INTRAMUSCULAR | Status: DC | PRN
  Administered 2016-01-20 (×2): 1 mg via INTRAVENOUS

## 2016-01-20 SURGICAL SUPPLY — 11 items
CATH INFINITI 5 FR JL3.5 (CATHETERS) ×3 IMPLANT
CATH INFINITI 5FR ANG PIGTAIL (CATHETERS) ×3 IMPLANT
CATH INFINITI JR4 5F (CATHETERS) ×3 IMPLANT
DEVICE RAD COMP TR BAND LRG (VASCULAR PRODUCTS) ×3 IMPLANT
GLIDESHEATH SLEND SS 6F .021 (SHEATH) ×3 IMPLANT
KIT HEART LEFT (KITS) ×3 IMPLANT
PACK CARDIAC CATHETERIZATION (CUSTOM PROCEDURE TRAY) ×3 IMPLANT
TRANSDUCER W/STOPCOCK (MISCELLANEOUS) ×3 IMPLANT
TUBING CIL FLEX 10 FLL-RA (TUBING) ×3 IMPLANT
WIRE HI TORQ VERSACORE-J 145CM (WIRE) ×3 IMPLANT
WIRE SAFE-T 1.5MM-J .035X260CM (WIRE) ×3 IMPLANT

## 2016-01-20 NOTE — Discharge Instructions (Signed)
Radial Site Care °Refer to this sheet in the next few weeks. These instructions provide you with information about caring for yourself after your procedure. Your health care provider may also give you more specific instructions. Your treatment has been planned according to current medical practices, but problems sometimes occur. Call your health care provider if you have any problems or questions after your procedure. °WHAT TO EXPECT AFTER THE PROCEDURE °After your procedure, it is typical to have the following: °· Bruising at the radial site that usually fades within 1-2 weeks. °· Blood collecting in the tissue (hematoma) that may be painful to the touch. It should usually decrease in size and tenderness within 1-2 weeks. °HOME CARE INSTRUCTIONS °· Take medicines only as directed by your health care provider. °· You may shower 24-48 hours after the procedure or as directed by your health care provider. Remove the bandage (dressing) and gently wash the site with plain soap and water. Pat the area dry with a clean towel. Do not rub the site, because this may cause bleeding. °· Do not take baths, swim, or use a hot tub until your health care provider approves. °· Check your insertion site every day for redness, swelling, or drainage. °· Do not apply powder or lotion to the site. °· Do not flex or bend the affected arm for 24 hours or as directed by your health care provider. °· Do not push or pull heavy objects with the affected arm for 24 hours or as directed by your health care provider. °· Do not lift over 10 lb (4.5 kg) for 5 days after your procedure or as directed by your health care provider. °· Ask your health care provider when it is okay to: °¨ Return to work or school. °¨ Resume usual physical activities or sports. °¨ Resume sexual activity. °· Do not drive home if you are discharged the same day as the procedure. Have someone else drive you. °· You may drive 24 hours after the procedure unless otherwise  instructed by your health care provider. °· Do not operate machinery or power tools for 24 hours after the procedure. °· If your procedure was done as an outpatient procedure, which means that you went home the same day as your procedure, a responsible adult should be with you for the first 24 hours after you arrive home. °· Keep all follow-up visits as directed by your health care provider. This is important. °SEEK MEDICAL CARE IF: °· You have a fever. °· You have chills. °· You have increased bleeding from the radial site. Hold pressure on the site. °SEEK IMMEDIATE MEDICAL CARE IF: °· You have unusual pain at the radial site. °· You have redness, warmth, or swelling at the radial site. °· You have drainage (other than a small amount of blood on the dressing) from the radial site. °· The radial site is bleeding, and the bleeding does not stop after 30 minutes of holding steady pressure on the site. °· Your arm or hand becomes pale, cool, tingly, or numb. °  °This information is not intended to replace advice given to you by your health care provider. Make sure you discuss any questions you have with your health care provider. °  °Document Released: 05/05/2010 Document Revised: 04/23/2014 Document Reviewed: 10/19/2013 °Elsevier Interactive Patient Education ©2016 [employer] ° °

## 2016-01-20 NOTE — Interval H&P Note (Signed)
History and Physical Interval Note:  01/20/2016 9:36 AM  Ashley [name]  has presented today for cardiac cath with the diagnosis of unstable angina  The various methods of treatment have been discussed with the patient and family. After consideration of risks, benefits and other options for treatment, the patient has consented to  Procedure(s): Left Heart Cath and Coronary Angiography (N/A) as a surgical intervention .  The patient's history has been reviewed, patient examined, no change in status, stable for surgery.  I have reviewed the patient's chart and labs.  Questions were answered to the patient's satisfaction.    Cath Lab Visit (complete for each Cath Lab visit)  Clinical Evaluation Leading to the Procedure:   ACS: No.  Non-ACS:    Anginal Classification: CCS III  Anti-ischemic medical therapy: Maximal Therapy (2 or more classes of medications)  Non-Invasive Test Results: Low-risk stress test findings: cardiac mortality <1%/year  Prior CABG: No previous CABG         Ashley [name]

## 2016-01-20 NOTE — H&P (View-Only) (Signed)
01/12/2016 Ashley [name]   Jul 15, 1950  [id number]  Primary Physician [name], NP Primary Cardiologist: Dr. Angelena Form   Reason for Visit/CC: F/u for Dyspnea; Fatigue  HPI:   65 yo female with history of HTN, HLD, tobacco abuse, GERD, breast cancer. She was seen by Dr. Angelena Form as a new patient 11/08/15 for cardiac evaluation. She has no known heart disease. She had a cardiac cath in 1997 and reports no CAD. She was told she may have had coronary spasms.She has an ascending aortic aneurysm followed in primary care. Last CTA in Sept 2016 with 4.2 cm ascending aorta. She has smoked 1/2 ppd for 45 years.   She was recently seen by Dr. Angelena Form for preoperative clearance prior to undergoing back surgery for spinal stenosis. She denied CP but given her risk factors, he ordered for her to undergo a NST to exclude ischemia. This was performed 11/09/15. The study was normal w/o ischemia. EF was 63%. She was cleared for surgery which was preformed 11/16/15 w/o any serious complications. Her post op recovery was complicated by hypoxia. IM was consulted. She underwent chest CT 11/19/15 which showed no PE. There was Atherosclerosis of the thoracic aorta without dissection. Unchanged ascending thoracic aortic aneurysm measuring 4.2 x 4.0 cm. Coronary artery calcifications were seen.  CXR did suggest new onset cardiomegaly with increasing pulmonary vascular congestion. This was after receiving perioperative IVFs, ~4.3 L over a 2 day period. Fluids were stopped and she was placed on IV Lasix. Echo showed normal LVEF of 55-60%. Wall motion was normal. Left ventricular diastolic function parameters were normal. Valve function was normal. She was discharged on 11/16/15.  I saw her on 12/28/2015 with new complaints of unexplanined dyspnea, fatigue, anorexia, weight loss, hypotension and intermitted CP and mid scapular pain. She was seen in the ED, the day prior to visit and CXR was neg for evidence of cardiovascular and  pulmonary disease. D dimer was negative, BNP was negative. She had no evidence of respiratory distress in the ED and had normal oxygen saturations on room air. She remained free of chest pain, troponin was negative with a normal EKG. She was given 500 ml bolus of NS in the ED with some improvement in her symptoms. Her blood pressures remained stable with some improvement after IV fluids. She was sent home from ED and did not require admission.   When I evaluated her, she noted that he still felt poorly. Still with symptoms of unexplanined dyspnea, fatigue, anorexia, weight loss, hypotension and intermitted CP.  EKG was non acute. I ordered a 2D echo to reassess LVEF and to r/o pericardial effusion. Given her symptoms of CP and dyspnea,  + risk factors (heavy smoker + family history with father dying suddenly from MI at age 68), to order a coronary CTA to better assess for underlying CAD.   2D echo shows recent drop in EF, now at 40-45%, down from 50-60%. No pericardial effusion. Coronary CTA was read by Dr. Meda Coffee. Results are as follows:   Aorta: Aneurysmal dilatation of the ascending aorta as described in the radiology overread.  Aortic Valve:  Trileaflet.  Minimal calcifications.  Coronary Arteries:  Normal origin.  Right dominance.  RCA is a very large dominant artery that gives rise to PDA and PLVB. There is mild calcified plaque in the proximal portion associated with 0-25% stenosis. There is minimal non-calcified plaque in the mid RCA associated with 0-25% stenosis.  Minimal calcifications in the distal RCA are associated  with 0-25% stenosis.  Left main is a large artery that has mild ostial calcified plaque with associated stenosis 25-50%.  LAD is a large artery that gives rise to two diagonal branches. LAD has a moderate non-calcified plaque in the mid portion just after the takeoff of the 1. diagonal and septal branch associated with 50-69% stenosis. There is minimal  calcified plaque in the distal LAD associated with 0-25% stenosis.  LCX is a moderate size artery that gives rise to two small OM branches. There is a moderate circumferential calcified plaque in the mid LCX artery associated with 50-69% stenosis.  IMPRESSION: 1. Coronary calcium score of 250. This was 101 percentile for age and sex matched control.  2. Normal coronary origin.  Right dominance.  3. 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 I would consider sending this study for further evaluation with CT FFR once available in our hospital.  Aggressive risk factor modification is recommended.  She is here today with her 2 daughters. They are very concerned about her decline and no improvement in symptoms. She is CP free. No resting dyspnea but she was short of breath earlier with ambulation.    Current Meds  Medication Sig  . aspirin EC 81 MG tablet Take 81 mg by mouth daily.  . Calcium Carb-Cholecalciferol (CALCIUM 600+D) 600-800 MG-UNIT TABS Take 1 tablet by mouth 2 (two) times daily.  . cephALEXin (KEFLEX) 500 MG capsule Take 500 mg by mouth 2 (two) times daily.  . Cholecalciferol (VITAMIN D-3) 1000 units CAPS Take 1 capsule by mouth daily.  . diazepam (VALIUM) 5 MG tablet Take 5 mg by mouth every 6 (six) hours as needed for muscle spasms.  Marland Kitchen diltiazem (CARDIZEM CD) 240 MG 24 hr capsule Take 240 mg by mouth daily.    Marland Kitchen dronabinol (MARINOL) 2.5 MG capsule Take 1 capsule (2.5 mg total) by mouth 2 (two) times daily before a meal.  . furosemide (LASIX) 40 MG tablet Take 1 tablet (40 mg total) by mouth daily as needed for edema.  . gabapentin (NEURONTIN) 300 MG capsule Take 300 mg by mouth at bedtime.  . hydroxyurea (HYDREA) 500 MG capsule 500 mg PO Mon and Tues and 1000 mg PO Wed- Sun (Patient taking differently: Take 500-1,000 mg by mouth See admin instructions. 1,000 mg on Sun/Wed/Thurs/Fri/Sat and 500 mg on Mon/Tues)  . ibandronate (BONIVA) 150 MG tablet Take 1 tablet  by mouth every 30 (thirty) days.  Marland Kitchen ipratropium-albuterol (DUONEB) 0.5-2.5 (3) MG/3ML SOLN Take 3 mLs by nebulization every 6 (six) hours as needed (shortness of breath).  . irbesartan-hydrochlorothiazide (AVALIDE) 150-12.5 MG tablet Take 1 tablet by mouth daily. Please hold if blood pressure less than 140/90.  Marland Kitchen isosorbide mononitrate (IMDUR) 60 MG 24 hr tablet Take 60 mg by mouth daily.    . methocarbamol (ROBAXIN) 500 MG tablet Take 1 tablet (500 mg total) by mouth every 6 (six) hours as needed for muscle spasms.  Marland Kitchen ofloxacin (OCUFLOX) 0.3 % ophthalmic solution Place 1 drop into both eyes every 2 (two) hours.  Marland Kitchen omeprazole (PRILOSEC OTC) 20 MG tablet Take 20 mg by mouth daily.  . ondansetron (ZOFRAN-ODT) 4 MG disintegrating tablet Take 4 mg by mouth 3 (three) times daily as needed for nausea or vomiting.  Marland Kitchen oxyCODONE-acetaminophen (PERCOCET/ROXICET) 5-325 MG tablet Take 1-2 tablets by mouth every 6 (six) hours as needed for moderate pain or severe pain.  . rosuvastatin (CRESTOR) 40 MG tablet Take 40 mg by mouth daily.    Marland Kitchen  traMADol (ULTRAM) 50 MG tablet Take one to two (1 - 2) tablets (50 mg - 100 mg total) by mouth three (3) times daily as needed for pain.   Allergies  Allergen Reactions  . Penicillins Hives and Rash    Has patient had a PCN reaction causing immediate rash, facial/tongue/throat swelling, SOB or lightheadedness with hypotension: Yes Has patient had a PCN reaction causing severe rash involving mucus membranes or skin necrosis: Yes Has patient had a PCN reaction that required hospitalization No Has patient had a PCN reaction occurring within the last 10 years: Yes If all of the above answers are "NO", then may proceed with Cephalosporin use.   . Tape Rash    Medipore, Coban, and paper tape CAN be tolerated   Past Medical History:  Diagnosis Date  . Arthritis   . Ascending aortic aneurysm ([place])   . Cancer [facility]) 1995   breast, left, mastectomy/chemo  . Chest pain     Possibly cardiac. No evidence of ischemia/injury based upon normal troponin I. Chest discomfort could be tachycardia induced supply demand mismatch.   . Colon adenomas   . Coronary artery disease   . Essential hypertension, benign   . GERD (gastroesophageal reflux disease)   . Hyperlipidemia   . Hypertension   . Palpitations   . Pure hypercholesterolemia   . Thrombocytosis ([facility])    Idiopathic   Family History  Problem Relation Age of Onset  . Hypertension Mother   . Heart failure Mother   . Congestive Heart Failure Mother   . COPD Mother   . Pernicious anemia Mother   . Cancer Mother     lung  . Hypertension Father   . CAD Father   . Heart attack Father   . Hypertension Sister   . Cancer Other   . Celiac disease Other    Past Surgical History:  Procedure Laterality Date  . ANTERIOR AND POSTERIOR REPAIR N/A 12/09/2014   Procedure: ANTERIOR (CYSTOCELE) AND POSTERIOR REPAIR (RECTOCELE);  Surgeon: Bjorn Loser, MD;  Location: [facility];  Service: Urology;  Laterality: N/A;  . ANTERIOR LAT LUMBAR FUSION Left 11/16/2015   Procedure: LEFT SIDED LATERAL INTERBODY FUSION, LUMBAR 2-3, LUMBAR 3-4, LUMBAR 4-5 WITH INSTRUMENTATION;  Surgeon: Phylliss Bob, MD;  Location: [place];  Service: Orthopedics;  Laterality: Left;  LEFT SIDED LATEARL INTERBODY FUSION, LUMBAR 2-3, LUMBAR 3-4, LUMBAR 4-5 WITH INSTRUMENTATION   . APPENDECTOMY    . BONE MARROW ASPIRATION  07/2012  . BONE MARROW BIOPSY  07/2012  . BREAST SURGERY    . CARDIAC CATHETERIZATION    . COLONOSCOPY  11/29/2010   Procedure: COLONOSCOPY;  Surgeon: Rogene Houston, MD;  [facility];  Service: Endoscopy;  Laterality: N/A;  . COLONOSCOPY N/A 02/18/2014   Procedure: COLONOSCOPY;  Surgeon: Rogene Houston, MD;  Location: [facility];  Service: Endoscopy;  Laterality: N/A;  1030  . CYSTOSCOPY N/A 12/09/2014   Procedure: CYSTOSCOPY;  Surgeon: Bjorn Loser, MD;  Location: [place] [facility];  Service: Urology;  Laterality: N/A;  . IR  GENERIC HISTORICAL  01/11/2016   IR RADIOLOGY PERIPHERAL GUIDED IV START 01/11/2016 Saverio Danker, PA-C [facility]  . IR GENERIC HISTORICAL  01/11/2016   IR US GUIDE VASC ACCESS RIGHT 01/11/2016 Saverio Danker, PA-C [facility]  . MASTECTOMY     left  . OVARIAN CYST SURGERY     x2  . SALPINGOOPHORECTOMY Bilateral 12/09/2014   Procedure: SALPINGO OOPHORECTOMY;  Surgeon: Servando Salina, MD;  Location[facility];  Service: Gynecology;  Laterality: Bilateral;  . TUBAL LIGATION    . VAGINAL HYSTERECTOMY N/A 12/09/2014   Procedure: HYSTERECTOMY VAGINAL;  Surgeon: Servando Salina, MD;  Location: [place] [facility];  Service: Gynecology;  Laterality: N/A;   Social History   Social History  . Marital status: Divorced    Spouse name: N/A  . Number of children: 2  . Years of education: N/A   Occupational History  . Not on file.   Social History Main Topics  . Smoking status: Current Every Day Smoker    Packs/day: 0.50    Years: 50.00    Types: Cigarettes  . Smokeless tobacco: Never Used  . Alcohol use No  . Drug use: No  . Sexual activity: Yes    Birth control/ protection: Post-menopausal   Other Topics Concern  . Not on file   Social History Narrative  . No narrative on file     Review of Systems: General: negative for chills, fever, night sweats or weight changes.  Cardiovascular: negative for chest pain, dyspnea on exertion, edema, orthopnea, palpitations, paroxysmal nocturnal dyspnea or shortness of breath Dermatological: negative for rash Respiratory: negative for cough or wheezing Urologic: negative for hematuria Abdominal: negative for nausea, vomiting, diarrhea, bright red blood per rectum, melena, or hematemesis Neurologic: negative for visual changes, syncope, or dizziness All other systems reviewed and are otherwise negative except as noted above.   Physical Exam:  Blood pressure 112/70, pulse 88, height 5' 3" (1.6 m), weight 118 lb (53.5 kg).  General appearance: alert,  cooperative and thin/ frail appearing Neck: no carotid bruit and no JVD Lungs: clear to auscultation bilaterally Heart: regular rate and rhythm, S1, S2 normal, no murmur, click, rub or gallop Extremities: no LEE Pulses: 2+ and symmetric Skin: warm and dry Neurologic: Grossly normal   ASSESSMENT AND PLAN:   1. Pt still with symptoms of fatigue, decreased appetite, weight loss, dyspnea and intermitted chest and mid scapular pain. Her risk factor include HTN, HLD, heavy tobacco use and family h/o CAD (father died from MI at age 50). She recently had a NST 11/2015 as part of surgical clearance for back surgery that was low risk for ischemia and with EF of 63%. Following her back surgery, she developed the above symptoms and has not had any improvement. She had an echo at [facility] following her sugery that showed normal LVEF. I reordered an echo 12/2015 that showed reduction in EF down to 40-45%, previously 55-60%. No pericardial effusion. Recent Coronary CTA shows moderate CAD in the mid LAD and mid LCX arteries, 50-69%. ? further evaluation with CT FFR once available in our hospital, vs going ahead and proceeding with cath. Will route note, coronary CT and echo findings to Dr. Angelena Form to review, to help make a decision regarding plan of care. I did discuss with patient the possibility of LHC including potential risk. Pt is in agreement to proceed if Dr. Angelena Form agrees.     Brittainy Simmons PA-C 01/12/2016 11:13 AM

## 2016-01-23 ENCOUNTER — Telehealth: Payer: Self-pay | Admitting: Cardiovascular Disease

## 2016-01-23 ENCOUNTER — Telehealth (HOSPITAL_COMMUNITY): Payer: Self-pay | Admitting: *Deleted

## 2016-01-23 NOTE — Telephone Encounter (Signed)
New message   Pt verbalized that she is returning call for rn from friday

## 2016-01-23 NOTE — Telephone Encounter (Signed)
Pt states that she wasn't able to eat or drink anything but started taking the Marinol and she states that she personally feels better. Pt states that she will call if anything changes.

## 2016-01-23 NOTE — Telephone Encounter (Signed)
-----   Message from Louis Meckel sent at 01/20/2016 11:16 AM EDT ----- Regarding: please call Patients daughter called [name] and is worried about her Mom.  She is not feeling any better and seems to be getting worse.  She wants to talk to someone about her.  Please call her at [phone]

## 2016-01-23 NOTE — Telephone Encounter (Signed)
I spoke to pt. She states Pat left her a message on 01/20/16 to return her call at office on Monday.  I have reviewed her chart and am unable to determine the nature/reason for the call. I advised patient I would forward message to [facility] for further investigation.   I ask patient how she was feeling since procedure on Friday. She states she is feeling fine at this point. I reminded her to follow-up in 1-2 week, she states when daughter is available she will call back and sch appt.

## 2016-01-25 NOTE — Telephone Encounter (Signed)
My call to pt was on 9/29 to schedule cath.  I had to leave message to call office on Monday.  Pt has since had procedure.  I spoke with pt and gave her this information.  Pt has appt with Melina Copa, PA on 10/24 for cath follow up and she is aware of this appt.

## 2016-02-07 ENCOUNTER — Ambulatory Visit: Payer: Medicare Other | Admitting: Physician Assistant

## 2016-02-13 ENCOUNTER — Ambulatory Visit: Payer: Medicare Other | Admitting: Physician Assistant

## 2016-02-20 ENCOUNTER — Encounter (HOSPITAL_COMMUNITY): Payer: Medicare Other | Attending: Hematology & Oncology | Admitting: Hematology & Oncology

## 2016-02-20 ENCOUNTER — Encounter (HOSPITAL_COMMUNITY): Payer: Self-pay | Admitting: Hematology & Oncology

## 2016-02-20 ENCOUNTER — Encounter (HOSPITAL_COMMUNITY): Payer: Medicare Other

## 2016-02-20 VITALS — BP 91/63 | HR 101 | Temp 98.7°F | Resp 16 | Wt 116.0 lb

## 2016-02-20 DIAGNOSIS — R110 Nausea: Secondary | ICD-10-CM

## 2016-02-20 DIAGNOSIS — R630 Anorexia: Secondary | ICD-10-CM | POA: Diagnosis not present

## 2016-02-20 DIAGNOSIS — I509 Heart failure, unspecified: Secondary | ICD-10-CM | POA: Diagnosis not present

## 2016-02-20 DIAGNOSIS — Z85820 Personal history of malignant melanoma of skin: Secondary | ICD-10-CM | POA: Diagnosis not present

## 2016-02-20 DIAGNOSIS — I110 Hypertensive heart disease with heart failure: Secondary | ICD-10-CM | POA: Insufficient documentation

## 2016-02-20 DIAGNOSIS — K219 Gastro-esophageal reflux disease without esophagitis: Secondary | ICD-10-CM | POA: Insufficient documentation

## 2016-02-20 DIAGNOSIS — E7800 Pure hypercholesterolemia, unspecified: Secondary | ICD-10-CM | POA: Insufficient documentation

## 2016-02-20 DIAGNOSIS — Z853 Personal history of malignant neoplasm of breast: Secondary | ICD-10-CM | POA: Diagnosis not present

## 2016-02-20 DIAGNOSIS — G4700 Insomnia, unspecified: Secondary | ICD-10-CM

## 2016-02-20 DIAGNOSIS — C50912 Malignant neoplasm of unspecified site of left female breast: Secondary | ICD-10-CM

## 2016-02-20 DIAGNOSIS — D473 Essential (hemorrhagic) thrombocythemia: Secondary | ICD-10-CM | POA: Insufficient documentation

## 2016-02-20 DIAGNOSIS — I712 Thoracic aortic aneurysm, without rupture: Secondary | ICD-10-CM | POA: Diagnosis not present

## 2016-02-20 DIAGNOSIS — M1990 Unspecified osteoarthritis, unspecified site: Secondary | ICD-10-CM | POA: Insufficient documentation

## 2016-02-20 DIAGNOSIS — C4372 Malignant melanoma of left lower limb, including hip: Secondary | ICD-10-CM

## 2016-02-20 DIAGNOSIS — D649 Anemia, unspecified: Secondary | ICD-10-CM

## 2016-02-20 DIAGNOSIS — D7589 Other specified diseases of blood and blood-forming organs: Secondary | ICD-10-CM

## 2016-02-20 DIAGNOSIS — R002 Palpitations: Secondary | ICD-10-CM | POA: Diagnosis not present

## 2016-02-20 DIAGNOSIS — Z0000 Encounter for general adult medical examination without abnormal findings: Secondary | ICD-10-CM

## 2016-02-20 DIAGNOSIS — Z23 Encounter for immunization: Secondary | ICD-10-CM | POA: Diagnosis not present

## 2016-02-20 DIAGNOSIS — I959 Hypotension, unspecified: Secondary | ICD-10-CM | POA: Insufficient documentation

## 2016-02-20 DIAGNOSIS — F17210 Nicotine dependence, cigarettes, uncomplicated: Secondary | ICD-10-CM | POA: Insufficient documentation

## 2016-02-20 DIAGNOSIS — I2510 Atherosclerotic heart disease of native coronary artery without angina pectoris: Secondary | ICD-10-CM | POA: Insufficient documentation

## 2016-02-20 LAB — CBC WITH DIFFERENTIAL/PLATELET
BASOS ABS: 0 10*3/uL (ref 0.0–0.1)
Basophils Relative: 0 %
Eosinophils Absolute: 0.1 10*3/uL (ref 0.0–0.7)
Eosinophils Relative: 2 %
HEMATOCRIT: 32.9 % — AB (ref 36.0–46.0)
HEMOGLOBIN: 10.9 g/dL — AB (ref 12.0–15.0)
LYMPHS PCT: 37 %
Lymphs Abs: 2.1 10*3/uL (ref 0.7–4.0)
MCH: 35.6 pg — ABNORMAL HIGH (ref 26.0–34.0)
MCHC: 33.1 g/dL (ref 30.0–36.0)
MCV: 107.5 fL — AB (ref 78.0–100.0)
MONO ABS: 0.4 10*3/uL (ref 0.1–1.0)
Monocytes Relative: 7 %
NEUTROS ABS: 3 10*3/uL (ref 1.7–7.7)
NEUTROS PCT: 54 %
Platelets: 437 10*3/uL — ABNORMAL HIGH (ref 150–400)
RBC: 3.06 MIL/uL — AB (ref 3.87–5.11)
RDW: 19.7 % — ABNORMAL HIGH (ref 11.5–15.5)
WBC: 5.6 10*3/uL (ref 4.0–10.5)

## 2016-02-20 LAB — COMPREHENSIVE METABOLIC PANEL
ALK PHOS: 69 U/L (ref 38–126)
ALT: 10 U/L — AB (ref 14–54)
AST: 21 U/L (ref 15–41)
Albumin: 3.8 g/dL (ref 3.5–5.0)
Anion gap: 7 (ref 5–15)
BUN: 11 mg/dL (ref 6–20)
CO2: 29 mmol/L (ref 22–32)
CREATININE: 0.73 mg/dL (ref 0.44–1.00)
Calcium: 9.1 mg/dL (ref 8.9–10.3)
Chloride: 102 mmol/L (ref 101–111)
GFR calc Af Amer: >60 mL/min (ref >60)
GLUCOSE: 140 mg/dL — AB (ref 65–99)
Potassium: 3.6 mmol/L (ref 3.5–5.1)
Sodium: 138 mmol/L (ref 135–145)
TOTAL PROTEIN: 7.5 g/dL (ref 6.5–8.1)
Total Bilirubin: 0.4 mg/dL (ref 0.3–1.2)

## 2016-02-20 MED ORDER — CLONAZEPAM 0.5 MG PO TABS: 0.5000 mg | ORAL_TABLET | Freq: Every day | ORAL | 2 refills | Status: DC

## 2016-02-20 MED ORDER — INFLUENZA VAC SPLIT QUAD 0.5 ML IM SUSY
0.5000 mL | PREFILLED_SYRINGE | Freq: Once | INTRAMUSCULAR | Status: AC
  Administered 2016-02-20: 0.5 mL via INTRAMUSCULAR
  Filled 2016-02-20: qty 0.5

## 2016-02-20 NOTE — Progress Notes (Signed)
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[place])   09/29/1993 - 05/14/1994 Chemotherapy     AC Q 3 weeks X 6 cycles      01/02/1994 Surgery    Left modified radical mastectomy      01/02/1994 Pathology Results    ER-, PR - with a single positive LN found in the L axillae, Stage II disease      07/22/2015 Imaging    Bone Scan, No metastatic pattern uptake, degenerative changes in the lumbar spine with dextroscoliosis       Melanoma ([place])   07/09/2013 Initial Biopsy    Initial biopsy L upper thigh/buttocks melanoma      07/20/2013 Surgery    Excision L lateral buttock/upper thigh melanoma, clear margins      07/20/2013 Pathology Results    B[place]low depth 1.02 mm, pT2a        HISTORY OF PRESENTING ILLNESS:  Ashley [name] 65 y.o. female is here because of Essential thrombocytosis. She also has a history of breast cancer and melanoma. She had back surgery a few months back and recovery has been difficult but she is finally making progress.   Ashley [name] returns to the [place] today accompanied by her daughter.  She is not nauseated and has been eating well. She is taking Marinol. She feels like she is better and her daughter agrees. The patient just feels like she cannot get her energy back. Her back aches a little bit but when it does she'll just relax in her recliner.   The only thing giving her a problem now is her eyes with corneal ulcers. She is seeing her ophthalmologist tomorrow. Describes her eye as being really, really tender and watery.  Her daughter notes her mother has been having trouble sleeping. She was on Valium but it doesn't seem to be helping. She has discontinued this.  She is seeing her cardiologist again this Wednesday.   She complains of abdominal tenderness but  attributes it to surgery. She has not heard back from Dr. Olevia Perches office.   No headaches.  Energy is improved.    MEDICAL HISTORY:  Past Medical History:  Diagnosis Date  . Arthritis   . Ascending aortic aneurysm ([place])   . Cancer [facility]) 1995   breast, left, mastectomy/chemo  . Chest pain    Possibly cardiac. No evidence of ischemia/injury based upon normal troponin I. Chest discomfort could be tachycardia induced supply demand mismatch.   . Colon adenomas   . Coronary artery disease   . Essential hypertension, benign   . GERD (gastroesophageal reflux disease)   . Hyperlipidemia   . Hypertension   . Palpitations   . Pure hypercholesterolemia   . Thrombocytosis ([place])    Idiopathic    SURGICAL HISTORY: Past Surgical History:  Procedure Laterality Date  . ANTERIOR AND POSTERIOR REPAIR N/A 12/09/2014   Procedure: ANTERIOR (CYSTOCELE) AND POSTERIOR REPAIR (RECTOCELE);  Surgeon: Bjorn Loser, MD;  Location: [facility];  Service: Urology;  Laterality: N/A;  . ANTERIOR LAT LUMBAR FUSION Left 11/16/2015   Procedure: LEFT SIDED LATERAL INTERBODY FUSION, LUMBAR 2-3, LUMBAR 3-4, LUMBAR 4-5 WITH INSTRUMENTATION;  Surgeon: Phylliss Bob, MD;  Location: [facility];  Service: Orthopedics;  Laterality: Left;  LEFT SIDED LATEARL INTERBODY FUSION, LUMBAR 2-3, LUMBAR 3-4, LUMBAR 4-5 WITH INSTRUMENTATION   .  APPENDECTOMY    . BONE MARROW ASPIRATION  07/2012  . BONE MARROW BIOPSY  07/2012  . BREAST SURGERY    . CARDIAC CATHETERIZATION    . CARDIAC CATHETERIZATION N/A 01/20/2016   Procedure: Left Heart Cath and Coronary Angiography;  Surgeon: Burnell Blanks, MD;  Location: [facility];  Service: Cardiovascular;  Laterality: N/A;  . COLONOSCOPY  11/29/2010   Procedure: COLONOSCOPY;  Surgeon: Rogene Houston, MD;  [facility];  Service: Endoscopy;  Laterality: N/A;  . COLONOSCOPY N/A 02/18/2014   Procedure: COLONOSCOPY;  Surgeon: Rogene Houston, MD;  [facility];   Service: Endoscopy;  Laterality: N/A;  1030  . CYSTOSCOPY N/A 12/09/2014   Procedure: CYSTOSCOPY;  Surgeon: Bjorn Loser, MD;  Location: [facility];  Service: Urology;  Laterality: N/A;  . IR GENERIC HISTORICAL  01/11/2016   IR RADIOLOGY PERIPHERAL GUIDED IV START 01/11/2016 Saverio Danker, PA-C [facility]  . IR GENERIC HISTORICAL  01/11/2016   IR US GUIDE VASC ACCESS RIGHT 01/11/2016 Saverio Danker, PA-C [facility]  . MASTECTOMY     left  . OVARIAN CYST SURGERY     x2  . SALPINGOOPHORECTOMY Bilateral 12/09/2014   Procedure: SALPINGO OOPHORECTOMY;  Surgeon: Servando Salina, MD;  Location: [facility];  Service: Gynecology;  Laterality: Bilateral;  . TUBAL LIGATION    . VAGINAL HYSTERECTOMY N/A 12/09/2014   Procedure: HYSTERECTOMY VAGINAL;  Surgeon: Servando Salina, MD;  Location: [facility];  Service: Gynecology;  Laterality: N/A;    SOCIAL HISTORY: Social History   Social History  . Marital status: Divorced    Spouse name: N/A  . Number of children: 2  . Years of education: N/A   Occupational History  . Not on file.   Social History Main Topics  . Smoking status: Current Every Day Smoker    Packs/day: 0.50    Years: 50.00    Types: Cigarettes  . Smokeless tobacco: Never Used  . Alcohol use No  . Drug use: No  . Sexual activity: Yes    Birth control/ protection: Post-menopausal   Other Topics Concern  . Not on file   Social History Narrative  . No narrative on file   Divorced 2 children 4 grandchildren 1 great grandchild Smoker ETOH, social. Daughter notes 3 a year. She has no hobbies right now. She does like yard work. 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 Hypertension Mother   . Heart failure Mother   . Congestive Heart Failure Mother   . COPD Mother   . Pernicious anemia Mother   . Cancer Mother     lung  . Hypertension Father     . CAD Father   . Heart attack Father   . Hypertension Sister   . Cancer Other   . Celiac disease Other    Mother is still living at 45 yo, smokes, and weighs 87 lbs. Father deceased at 40 yo of heart attack 1 sister, she has high blood pressure  ALLERGIES:  is allergic to penicillins and tape.  MEDICATIONS:  Current Outpatient Prescriptions  Medication Sig Dispense Refill  . Cholecalciferol (VITAMIN D-3) 1000 units CAPS Take 1 capsule by mouth daily.    . furosemide (LASIX) 40 MG tablet Take 1 tablet (40 mg total) by mouth daily as needed for edema. 30 tablet 0  .  hydroxyurea (HYDREA) 500 MG capsule 500 mg PO Mon and Tues and 1000 mg PO Wed- Sun (Patient taking differently: Take 500-1,000 mg by mouth See admin instructions. 1,000 mg on Sun/Wed/Thurs/Fri/Sat and 500 mg on Mon/Tues) 50 capsule 0  . ibandronate (BONIVA) 150 MG tablet Take 1 tablet by mouth every 30 (thirty) days.    Marland Kitchen ipratropium-albuterol (DUONEB) 0.5-2.5 (3) MG/3ML SOLN Take 3 mLs by nebulization every 6 (six) hours as needed (shortness of breath). 360 mL 0  . omeprazole (PRILOSEC OTC) 20 MG tablet Take 20 mg by mouth daily.    . rosuvastatin (CRESTOR) 40 MG tablet Take 40 mg by mouth daily.      Marland Kitchen aspirin EC 81 MG tablet Take 81 mg by mouth daily.    . Calcium Carb-Cholecalciferol (CALCIUM 600+D) 600-800 MG-UNIT TABS Take 1 tablet by mouth 2 (two) times daily.    . diazepam (VALIUM) 5 MG tablet Take 5 mg by mouth every 6 (six) hours as needed for muscle spasms.    Marland Kitchen diltiazem (CARDIZEM CD) 240 MG 24 hr capsule Take 240 mg by mouth daily.      Marland Kitchen dronabinol (MARINOL) 2.5 MG capsule Take 1 capsule (2.5 mg total) by mouth 2 (two) times daily before a meal. (Patient not taking: Reported on 02/20/2016) 60 capsule 2  . gabapentin (NEURONTIN) 300 MG capsule Take 300 mg by mouth at bedtime.    . irbesartan-hydrochlorothiazide (AVALIDE) 150-12.5 MG tablet Take 1 tablet by mouth daily. Please hold if blood pressure less than  140/90. (Patient not taking: Reported on 02/20/2016) 30 tablet 0  . isosorbide mononitrate (IMDUR) 60 MG 24 hr tablet Take 60 mg by mouth daily.      . methocarbamol (ROBAXIN) 500 MG tablet Take 1 tablet (500 mg total) by mouth every 6 (six) hours as needed for muscle spasms. (Patient not taking: Reported on 02/20/2016) 60 tablet 0  . ofloxacin (OCUFLOX) 0.3 % ophthalmic solution Place 1 drop into the right eye 4 (four) times daily.    . ondansetron (ZOFRAN-ODT) 4 MG disintegrating tablet Take 4 mg by mouth 3 (three) times daily as needed for nausea or vomiting.    . sodium chloride (MURO 128) 2 % ophthalmic solution Place 1 drop into both eyes 4 (four) times daily as needed for irritation.    . traMADol (ULTRAM) 50 MG tablet Take 50-100 mg by mouth at bedtime as needed. For pain.     No current facility-administered medications for this visit.     Review of Systems  Constitutional: Positive for malaise/fatigue.       Loss of appetite  HENT: Negative.   Eyes: Positive for pain and discharge.       Corneal ulcers  Cardiovascular: Negative.   Gastrointestinal: Positive for abdominal pain.       Abdominal tenderness associated with surgery  Genitourinary: Negative.   Musculoskeletal: Positive for back pain.  Skin: Negative.   Endo/Heme/Allergies: Negative.   Psychiatric/Behavioral: The patient has insomnia.   All other systems reviewed and are negative.  14 point ROS was done and is otherwise as detailed above or in HPI   PHYSICAL EXAMINATION: ECOG PERFORMANCE STATUS: 2 - Symptomatic, <50% confined to bed  Vitals:   02/20/16 1342  BP: 91/63  Pulse: (!) 101  Resp: 16  Temp: 98.7 F (37.1 C)   Filed Weights   02/20/16 1342  Weight: 116 lb (52.6 kg)   Physical Exam  Constitutional: She is oriented to person, place, and time and  well-developed, well-nourished, and in no distress.  HENT:  Head: Normocephalic and atraumatic.  Nose: Nose normal.  Mouth/Throat: Oropharynx is  clear and moist. No oropharyngeal exudate.  Eyes: Conjunctivae and EOM are normal. Pupils are equal, round, and reactive to light. Right eye exhibits no discharge. Left eye exhibits no discharge. No scleral icterus.  Neck: Normal range of motion. Neck supple. No tracheal deviation present. No thyromegaly present.  Cardiovascular: Normal rate, regular rhythm and normal heart sounds.  Exam reveals no gallop and no friction rub.   No murmur heard. Pulmonary/Chest: Effort normal and breath sounds normal. She has no wheezes. She has no rales.  Abdominal: Soft. Bowel sounds are normal. She exhibits no distension and no mass. There is tenderness. There is no rebound and no guarding.  Diffuse tenderness over the abdominal wall  Musculoskeletal: Normal range of motion. She exhibits no edema.  Lymphadenopathy:    She has no cervical adenopathy.  Neurological: She is alert and oriented to person, place, and time. No cranial nerve deficit. Gait normal. Coordination normal.  Skin: Skin is warm and dry. No rash noted.  Psychiatric: Mood, memory, affect and judgment normal.  Nursing note and vitals reviewed.   LABORATORY DATA:  I have reviewed the data as listed Lab Results  Component Value Date   WBC 5.6 02/20/2016   HGB 10.9 (L) 02/20/2016   HCT 32.9 (L) 02/20/2016   MCV 107.5 (H) 02/20/2016   PLT 437 (H) 02/20/2016   CMP     Component Value Date/Time   NA 138 02/20/2016 1235   K 3.6 02/20/2016 1235   CL 102 02/20/2016 1235   CO2 29 02/20/2016 1235   GLUCOSE 140 (H) 02/20/2016 1235   BUN 11 02/20/2016 1235   CREATININE 0.73 02/20/2016 1235   CALCIUM 9.1 02/20/2016 1235   PROT 7.5 02/20/2016 1235   ALBUMIN 3.8 02/20/2016 1235   AST 21 02/20/2016 1235   ALT 10 (L) 02/20/2016 1235   ALKPHOS 69 02/20/2016 1235   BILITOT 0.4 02/20/2016 1235   GFRNONAA >60 02/20/2016 1235   GFRAA >60 02/20/2016 1235       RADIOGRAPHIC STUDIES: I have personally reviewed the radiological images as  listed and agreed with the findings in the report. Study Result   CLINICAL DATA:  Lumbar fusion 4 weeks ago, short of breath off and on since surgery, weakness, has not felt well, smoker, coronary artery disease, COPD, CHF  EXAM: CHEST  2 VIEW  COMPARISON:  11/18/2015  FINDINGS: Normal heart size, mediastinal contours, and pulmonary vascularity.  Lungs emphysematous but clear.  No infiltrate, pleural effusion, or pneumothorax.  Bones demineralized with evidence of prior lumbar fusion.  IMPRESSION: COPD changes without acute infiltrate.   Electronically Signed   By: Lavonia Dana M.D.   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 Tressie Stalker on 08/16/15. She is to continue her current hydrea dose. Goal platelet count is < 450K. I do not see CALR, MPL, JAK 2 testing, this was ordered at her last visit but not performed. She has developed an anemia, will add basic anemia labs.  She will sign a release so I can obtain her BMBX.   I have referred the patient to Dr. Laural Golden to investigate her nausea and decreased appetite.  Her nausea began after her back surgery on August 2  to August 3rd. She is established with him. She has not heard from his office, we will look into this today.   I have written a prescription for klonopin to be used as a sleep aid.   She received a flu shot in our clinic today.  She will return for follow up in 3 months. She will be apprised of lab results when available. She is agreeable to another lab draw today.   ORDERS PLACED FOR THIS ENCOUNTER: No orders of the defined types were placed in this encounter.  Orders Placed This Encounter  Procedures  . JAK2 V617F, w Reflex to CALR/E12/MPL    Standing Status:   Future    Number of Occurrences:   1    Standing Expiration  Date:   02/19/2017  . Vitamin B12    Standing Status:   Future    Number of Occurrences:   1    Standing Expiration Date:   02/19/2017  . Folate    Standing Status:   Future    Number of Occurrences:   1    Standing Expiration Date:   02/19/2017  . Ferritin    Standing Status:   Future    Number of Occurrences:   1    Standing Expiration Date:   02/19/2017  . CEA    Standing Status:   Future    Number of Occurrences:   1    Standing Expiration Date:   02/19/2017  . CBC with Differential    Standing Status:   Future    Standing Expiration Date:   02/19/2017  . Comprehensive metabolic panel    Standing Status:   Future    Standing Expiration Date:   02/19/2017     MEDICATIONS PRESCRIBED THIS ENCOUNTER: No orders of the defined types were placed in this encounter.  Meds ordered this encounter  Medications  . clonazePAM (KLONOPIN) 0.5 MG tablet    Sig: Take 1 tablet (0.5 mg total) by mouth at bedtime.    Dispense:  30 tablet    Refill:  2  . Influenza vac split quadrivalent PF (FLUARIX) injection 0.5 mL    All questions were answered. The patient knows to call the clinic with any problems, questions or concerns.  This document serves as a record of services personally performed by Ancil Linsey, MD. It was created on her behalf by Arlyce Harman, a trained medical scribe. The creation of this record is based on the scribe's personal observations and the provider's statements to them. This document has been checked and approved by the attending provider.  I have reviewed the above documentation for accuracy and completeness and I agree with the above.  This note was electronically signed.    Molli Hazard, MD  02/20/2016 2:11 PM

## 2016-02-20 NOTE — Progress Notes (Signed)
Ashley [name] presents today for injection per MD orders. Flu Vaccine administered IM in right Upper Arm. Administration without incident. Patient tolerated well.

## 2016-02-20 NOTE — Patient Instructions (Addendum)
[place] at [facility] Discharge Instructions  RECOMMENDATIONS MADE BY THE CONSULTANT AND ANY TEST RESULTS WILL BE SENT TO YOUR REFERRING PHYSICIAN.  You saw Dr.Penland today. Labs today. Follow up in 3 months with lab work. Will check into referral to Dr. Laural Golden. Flu shot today. See Amy at checkout for appointments.  Thank you for choosing [place] at [facility] to provide your oncology and hematology care.  To afford each patient quality time with our provider, please arrive at least 15 minutes before your scheduled appointment time.   Beginning January 23rd 2017 lab work for the [facility] will be done in the  Main lab at [facility] on 1st floor. If you have a lab appointment with the [facility] please come in thru the  Main Entrance and check in at the main information desk  You need to re-schedule your appointment should you arrive 10 or more minutes late.  We strive to give you quality time with our providers, and arriving late affects you and other patients whose appointments are after yours.  Also, if you no show three or more times for appointments you may be dismissed from the clinic at the providers discretion.     Again, thank you for choosing [facility].  Our hope is that these requests will decrease the amount of time that you wait before being seen by our physicians.       _____________________________________________________________  Should you have questions after your visit to [facility], please contact our office at [phone] between the hours of 8:30 a.m. and 4:30 p.m.  Voicemails left after 4:30 p.m. will not be returned until the following business day.  For prescription refill requests, have your pharmacy contact our office.         Resources For Cancer Patients and their Caregivers ? American Cancer Society: Can assist with transportation, wigs, general needs, runs Look  Good Feel Better.        ([phone] ? Cancer Care: Provides financial assistance, online support groups, medication/co-pay assistance.  1-800-813-HOPE 8626296976) ? [place] Assists [place] cancer patients and their families through emotional , educational and financial support.  7791957835 ? [place] DSS Where to apply for food stamps, Medicaid and utility assistance. 484-535-6726 ? [facility]: Transportation to medical appointments. 608-596-8976 ? Social Security Administration: May apply for disability if have a Stage IV cancer. (708)272-1264 519-634-0720 ? [employer], Disability and Transit Services: Assists with nutrition, care and transit needs. [place] Support Programs: '@10RELATIVEDAYS'$ @ > Cancer Support Group  2nd Tuesday of the month 1pm-2pm, Journey Room  > Creative Journey  3rd Tuesday of the month 1130am-1pm, Journey Room  > Look Good Feel Better  1st Wednesday of the month 10am-12 noon, Journey Room (Call American Cancer Society to register [phone])   Influenza Virus Vaccine injection (Fluarix) What is this medicine? INFLUENZA VIRUS VACCINE (in floo EN zuh VAHY ruhs vak SEEN) helps to reduce the risk of getting influenza also known as the flu. This medicine may be used for other purposes; ask your health care provider or pharmacist if you have questions. What should I tell my health care provider before I take this medicine? They need to know if you have any of these conditions: -bleeding disorder like hemophilia -fever or infection -Guillain-Barre syndrome or other neurological problems -immune system problems -infection with the human immunodeficiency virus (HIV) or AIDS -low  blood platelet counts -multiple sclerosis -an unusual or allergic reaction to influenza virus vaccine, eggs, chicken proteins, latex, gentamicin, other medicines, foods, dyes or preservatives -pregnant or trying to get  pregnant -breast-feeding How should I use this medicine? This vaccine is for injection into a muscle. It is given by a health care professional. A copy of Vaccine Information Statements will be given before each vaccination. Read this sheet carefully each time. The sheet may change frequently. Talk to your pediatrician regarding the use of this medicine in children. Special care may be needed. Overdosage: If you think you have taken too much of this medicine contact a poison control center or emergency room at once. NOTE: This medicine is only for you. Do not share this medicine with others. What if I miss a dose? This does not apply. What may interact with this medicine? -chemotherapy or radiation therapy -medicines that lower your immune system like etanercept, anakinra, infliximab, and adalimumab -medicines that treat or prevent blood clots like warfarin -phenytoin -steroid medicines like prednisone or cortisone -theophylline -vaccines This list may not describe all possible interactions. Give your health care provider a list of all the medicines, herbs, non-prescription drugs, or dietary supplements you use. Also tell them if you smoke, drink alcohol, or use illegal drugs. Some items may interact with your medicine. What should I watch for while using this medicine? Report any side effects that do not go away within 3 days to your doctor or health care professional. Call your health care provider if any unusual symptoms occur within 6 weeks of receiving this vaccine. You may still catch the flu, but the illness is not usually as bad. You cannot get the flu from the vaccine. The vaccine will not protect against colds or other illnesses that may cause fever. The vaccine is needed every year. What side effects may I notice from receiving this medicine? Side effects that you should report to your doctor or health care professional as soon as possible: -allergic reactions like skin rash,  itching or hives, swelling of the face, lips, or tongue Side effects that usually do not require medical attention (report to your doctor or health care professional if they continue or are bothersome): -fever -headache -muscle aches and pains -pain, tenderness, redness, or swelling at site where injected -weak or tired This list may not describe all possible side effects. Call your doctor for medical advice about side effects. You may report side effects to FDA at 1-800-FDA-1088. Where should I keep my medicine? This vaccine is only given in a clinic, pharmacy, doctor's office, or other health care setting and will not be stored at home. NOTE: This sheet is a summary. It may not cover all possible information. If you have questions about this medicine, talk to your doctor, pharmacist, or health care provider.    2016, Elsevier/Gold Standard. (2007-10-29 09:30:40)

## 2016-02-21 ENCOUNTER — Encounter (INDEPENDENT_AMBULATORY_CARE_PROVIDER_SITE_OTHER): Payer: Self-pay | Admitting: Internal Medicine

## 2016-02-21 ENCOUNTER — Encounter (HOSPITAL_COMMUNITY): Payer: Self-pay | Admitting: Hematology & Oncology

## 2016-02-21 LAB — FERRITIN: Ferritin: 78 ng/mL (ref 11–307)

## 2016-02-21 LAB — CEA: CEA: 1.5 ng/mL (ref 0.0–4.7)

## 2016-02-21 LAB — VITAMIN B12: VITAMIN B 12: 139 pg/mL — AB (ref 180–914)

## 2016-02-21 LAB — FOLATE: FOLATE: 15 ng/mL (ref >5.9)

## 2016-02-22 ENCOUNTER — Ambulatory Visit (INDEPENDENT_AMBULATORY_CARE_PROVIDER_SITE_OTHER): Payer: Medicare Other | Admitting: Cardiology

## 2016-02-22 ENCOUNTER — Encounter: Payer: Self-pay | Admitting: Cardiology

## 2016-02-22 VITALS — BP 98/68 | HR 76 | Ht 63.0 in | Wt 118.0 lb

## 2016-02-22 DIAGNOSIS — R0609 Other forms of dyspnea: Secondary | ICD-10-CM

## 2016-02-22 NOTE — Patient Instructions (Signed)
Your physician recommends that you continue on your current medications as directed. Please refer to the Current Medication list given to you today.   Your physician wants you to follow-up in: [place] will receive a reminder letter in the mail two months in advance. If you don't receive a letter, please call our office to schedule the follow-up appointment.

## 2016-02-22 NOTE — Progress Notes (Signed)
02/22/2016 Ashley [name] Belenda Cruise   [birth year]  [id number]  Primary Physician [name], NP Primary Cardiologist: Dr. Angelena Form   Reason for Visit/CC: Post Cath F/u  HPI:  The patient is a 65 y/o female with h/o HTN, coronary vasospasm, mild systolic HF, tobacco use and DLD, who presents to clinic for post procedural f/u. She has recently underwent extensive cardiac testing for exertional dyspnea, including a NST and coronary CTA, leading up to a LHC per Dr. Angelena Form on 01/20/16. She was found to have mild non-obstructive CAD with normal LV filling pressures. No further ischemic w/u was recommended. Dr. Angelena Form recommended further investigation of the cause of her dyspnea with pulmonary w/u. Of note, she has also had recent unexplained loss of appetite and decrease in BP, requiring discontinuation of multiple of her BP meds. She now only takes lasix PRN. She is also undergoing w/u for anemia. H/H has been stable however Vitamin B12 was low. Folate pending. Her PCP is following this. A CEA level is also pending. PE has also been ruled out.   She presents back for f/u. She feels better but still with occasional exertional dyspnea. No resting symptoms. Her appetite is improved and she is eating more, resulting in better energy levels. A referral to pulmonology was offered, however she wishes to wait to see if she continues to improve before making consultation appt. BP remains soft but stable at 98/68.   Current Meds  Medication Sig  . Calcium Carb-Cholecalciferol (CALCIUM 600+D) 600-800 MG-UNIT TABS Take 1 tablet by mouth 2 (two) times daily.  . Cholecalciferol (VITAMIN D-3) 1000 units CAPS Take 1 capsule by mouth daily.  . clonazePAM (KLONOPIN) 0.5 MG tablet Take 1 tablet (0.5 mg total) by mouth at bedtime.  . furosemide (LASIX) 40 MG tablet Take 1 tablet (40 mg total) by mouth daily as needed for edema.  . gabapentin (NEURONTIN) 300 MG capsule Take 300 mg by mouth at bedtime.  . hydroxyurea  (HYDREA) 500 MG capsule 500 mg PO Mon and Tues and 1000 mg PO Wed- Sun (Patient taking differently: Take 500-1,000 mg by mouth See admin instructions. 1,000 mg on Sun/Wed/Thurs/Fri/Sat and 500 mg on Mon/Tues)  . ibandronate (BONIVA) 150 MG tablet Take 1 tablet by mouth every 30 (thirty) days.  Marland Kitchen ipratropium-albuterol (DUONEB) 0.5-2.5 (3) MG/3ML SOLN Take 3 mLs by nebulization every 6 (six) hours as needed (shortness of breath).  Marland Kitchen ketorolac (ACULAR) 0.4 % SOLN 1 drop 3 (three) times daily.  Marland Kitchen omeprazole (PRILOSEC OTC) 20 MG tablet Take 20 mg by mouth daily.  Vladimir Faster Glycol-Propyl Glycol (SYSTANE) 0.4-0.3 % GEL ophthalmic gel Place 1 application into both eyes at bedtime.  Vladimir Faster Glycol-Propyl Glycol (SYSTANE) 0.4-0.3 % SOLN Apply 1 drop to eye 2 (two) times daily.  . rosuvastatin (CRESTOR) 40 MG tablet Take 40 mg by mouth daily.    . sodium chloride (MURO 128) 2 % ophthalmic solution Place 1 drop into both eyes 4 (four) times daily as needed for irritation.   Allergies  Allergen Reactions  . Penicillins Hives and Rash    Has patient had a PCN reaction causing immediate rash, facial/tongue/throat swelling, SOB or lightheadedness with hypotension: Yes Has patient had a PCN reaction causing severe rash involving mucus membranes or skin necrosis: Yes Has patient had a PCN reaction that required hospitalization No Has patient had a PCN reaction occurring within the last 10 years: Yes If all of the above answers are "NO", then may proceed with Cephalosporin use.   Marland Kitchen  Tape Rash    Medipore, Coban, and paper tape CAN be tolerated   Past Medical History:  Diagnosis Date  . Arthritis   . Ascending aortic aneurysm ([place])   . Cancer [facility]) 1995   breast, left, mastectomy/chemo  . Chest pain    Possibly cardiac. No evidence of ischemia/injury based upon normal troponin I. Chest discomfort could be tachycardia induced supply demand mismatch.   . Colon adenomas   . Coronary artery disease   .  Essential hypertension, benign   . GERD (gastroesophageal reflux disease)   . Hyperlipidemia   . Hypertension   . Palpitations   . Pure hypercholesterolemia   . Thrombocytosis (HCC)    Idiopathic   Family History  Problem Relation Age of Onset  . Hypertension Mother   . Heart failure Mother   . Congestive Heart Failure Mother   . COPD Mother   . Pernicious anemia Mother   . Cancer Mother     lung  . Hypertension Father   . CAD Father   . Heart attack Father   . Hypertension Sister   . Cancer Other   . Celiac disease Other    Past Surgical History:  Procedure Laterality Date  . ANTERIOR AND POSTERIOR REPAIR N/A 12/09/2014   Procedure: ANTERIOR (CYSTOCELE) AND POSTERIOR REPAIR (RECTOCELE);  Surgeon: Bjorn Loser, MD;  Location: [place] [facility];  Service: Urology;  Laterality: N/A;  . ANTERIOR LAT LUMBAR FUSION Left 11/16/2015   Procedure: LEFT SIDED LATERAL INTERBODY FUSION, LUMBAR 2-3, LUMBAR 3-4, LUMBAR 4-5 WITH INSTRUMENTATION;  Surgeon: Phylliss Bob, MD;  Location: [place];  Service: Orthopedics;  Laterality: Left;  LEFT SIDED LATEARL INTERBODY FUSION, LUMBAR 2-3, LUMBAR 3-4, LUMBAR 4-5 WITH INSTRUMENTATION   . APPENDECTOMY    . BONE MARROW ASPIRATION  07/2012  . BONE MARROW BIOPSY  07/2012  . BREAST SURGERY    . CARDIAC CATHETERIZATION    . CARDIAC CATHETERIZATION N/A 01/20/2016   Procedure: Left Heart Cath and Coronary Angiography;  Surgeon: Burnell Blanks, MD;  Location: [place] [facility];  Service: Cardiovascular;  Laterality: N/A;  . COLONOSCOPY  11/29/2010   Procedure: COLONOSCOPY;  Surgeon: Rogene Houston, MD;  [facility];  Service: Endoscopy;  Laterality: N/A;  . COLONOSCOPY N/A 02/18/2014   Procedure: COLONOSCOPY;  Surgeon: Rogene Houston, MD;  [facility];  Service: Endoscopy;  Laterality: N/A;  1030  . CYSTOSCOPY N/A 12/09/2014   Procedure: CYSTOSCOPY;  Surgeon: Bjorn Loser, MD;  Location: [place] [facility];  Service: Urology;  Laterality: N/A;    . IR GENERIC HISTORICAL  01/11/2016   IR RADIOLOGY PERIPHERAL GUIDED IV START 01/11/2016 Saverio Danker, PA-C [facility]  . IR GENERIC HISTORICAL  01/11/2016   IR US GUIDE VASC ACCESS RIGHT 01/11/2016 Saverio Danker, PA-C [facility]  . MASTECTOMY     left  . OVARIAN CYST SURGERY     x2  . SALPINGOOPHORECTOMY Bilateral 12/09/2014   Procedure: SALPINGO OOPHORECTOMY;  Surgeon: Servando Salina, MD;  Location: [place] [facility];  Service: Gynecology;  Laterality: Bilateral;  . TUBAL LIGATION    . VAGINAL HYSTERECTOMY N/A 12/09/2014   Procedure: HYSTERECTOMY VAGINAL;  Surgeon: Servando Salina, MD;  Location: [place] [facility];  Service: Gynecology;  Laterality: N/A;   Social History   Social History  . Marital status: Divorced    Spouse name: N/A  . Number of children: 2  . Years of education: N/A   Occupational History  . Not on file.   Social History  Main Topics  . Smoking status: Current Every Day Smoker    Packs/day: 0.50    Years: 50.00    Types: Cigarettes  . Smokeless tobacco: Never Used  . Alcohol use No  . Drug use: No  . Sexual activity: Yes    Birth control/ protection: Post-menopausal   Other Topics Concern  . Not on file   Social History Narrative  . No narrative on file     Review of Systems: General: negative for chills, fever, night sweats or weight changes.  Cardiovascular: negative for chest pain, dyspnea on exertion, edema, orthopnea, palpitations, paroxysmal nocturnal dyspnea or shortness of breath Dermatological: negative for rash Respiratory: negative for cough or wheezing Urologic: negative for hematuria Abdominal: negative for nausea, vomiting, diarrhea, bright red blood per rectum, melena, or hematemesis Neurologic: negative for visual changes, syncope, or dizziness All other systems reviewed and are otherwise negative except as noted above.   Physical Exam:  Blood pressure 98/68, pulse 76, height '5\' 3"'  (1.6 m), weight 118 lb (53.5 kg).  General appearance:  alert, cooperative and thin/ frail appearing Neck: no carotid bruit and no JVD Lungs: clear to auscultation bilaterally Heart: regular rate and rhythm, S1, S2 normal, no murmur, click, rub or gallop Extremities: no LEE Pulses: 2+ and symmetric Skin: warm and dry Neurologic: Grossly normal   EKG not performed   ASSESSMENT AND PLAN:   1. Exertional Dyspnea: PE has been ruled out. No ischemic etiology. LHC showed mild nonobstructive CAD with normal LV filling pressures. She has mild systolic HF with EF of 10% but is euvolemic on exam. No resting dyspnea. She continues on PRN lasix. H/H has been stable but B12 low. PCP managing.  Referral to pulmonology offered, however patient wants to hold off at this time. She states her breathing has improved some. She will call back for referral, in no further improvement.   2. Tobacco Use: patient continues to smoke. Not interested in quitting at this time.   3. HTN: soft but stable. All antihypertensives have been discontinued. She takes Lasix PRN. Pt advised to monitor BP at home and to notify our office if any significant elevation of BP.   4. Stable Systolic HF: EF 07% on recent echo. [place] w/o obstructive CAD and normal filling pressues. Euvolemic on physical exam. Continue PRN lasix.  5. HLD: on statin therapy with Crestor.   4. Ascending thoracic aortic aneurysm measuring 4.2 x 4.0 cm: noted on recent CT of chest. Repeat study in 1 year.   PLAN  F/u with Dr. Angelena Form in 4 months.   Lyda Jester PA-C 02/22/2016 4:11 PM

## 2016-02-29 LAB — JAK2 V617F, W REFLEX TO CALR/E12/MPL

## 2016-02-29 LAB — CALR + JAK2 E12-15 + MPL (REFLEXED)

## 2016-03-02 ENCOUNTER — Ambulatory Visit (INDEPENDENT_AMBULATORY_CARE_PROVIDER_SITE_OTHER): Payer: Medicare Other | Admitting: Internal Medicine

## 2016-03-06 ENCOUNTER — Telehealth (HOSPITAL_COMMUNITY): Payer: Self-pay

## 2016-03-06 ENCOUNTER — Other Ambulatory Visit (HOSPITAL_COMMUNITY): Payer: Self-pay | Admitting: Hematology & Oncology

## 2016-03-06 DIAGNOSIS — E538 Deficiency of other specified B group vitamins: Secondary | ICD-10-CM

## 2016-03-06 DIAGNOSIS — D510 Vitamin B12 deficiency anemia due to intrinsic factor deficiency: Secondary | ICD-10-CM

## 2016-03-06 HISTORY — DX: Vitamin B12 deficiency anemia due to intrinsic factor deficiency: D51.0

## 2016-03-06 NOTE — Telephone Encounter (Signed)
-----   Message from Patrici Ranks, MD sent at 03/06/2016 11:38 AM EST ----- B12 deficiency. Needs to come in for labs, anti parietal cell antibodies, anti intrinsic factor antibodies, then B12 supportive therapy plan is in. Dr.P

## 2016-03-06 NOTE — Telephone Encounter (Signed)
Notified patient she needed labs and supportive B12 therapy. Amy to call with appt. Patient verbalized understanding.

## 2016-03-16 ENCOUNTER — Other Ambulatory Visit (HOSPITAL_COMMUNITY): Payer: Self-pay

## 2016-03-16 DIAGNOSIS — D473 Essential (hemorrhagic) thrombocythemia: Secondary | ICD-10-CM

## 2016-03-19 ENCOUNTER — Telehealth (HOSPITAL_COMMUNITY): Payer: Self-pay | Admitting: *Deleted

## 2016-03-19 ENCOUNTER — Encounter (HOSPITAL_COMMUNITY): Payer: Medicare Other | Attending: Hematology & Oncology

## 2016-03-19 ENCOUNTER — Other Ambulatory Visit (HOSPITAL_COMMUNITY): Payer: Self-pay | Admitting: Oncology

## 2016-03-19 ENCOUNTER — Encounter (HOSPITAL_COMMUNITY): Payer: Medicare Other

## 2016-03-19 ENCOUNTER — Encounter (HOSPITAL_COMMUNITY): Payer: Self-pay | Admitting: *Deleted

## 2016-03-19 VITALS — BP 118/71 | HR 72 | Temp 98.1°F | Resp 18

## 2016-03-19 DIAGNOSIS — C50912 Malignant neoplasm of unspecified site of left female breast: Secondary | ICD-10-CM | POA: Diagnosis not present

## 2016-03-19 DIAGNOSIS — E876 Hypokalemia: Secondary | ICD-10-CM

## 2016-03-19 DIAGNOSIS — D473 Essential (hemorrhagic) thrombocythemia: Secondary | ICD-10-CM

## 2016-03-19 DIAGNOSIS — E538 Deficiency of other specified B group vitamins: Secondary | ICD-10-CM

## 2016-03-19 LAB — CBC WITH DIFFERENTIAL/PLATELET
BASOS PCT: 0 %
Basophils Absolute: 0 10*3/uL (ref 0.0–0.1)
EOS PCT: 2 %
Eosinophils Absolute: 0.1 10*3/uL (ref 0.0–0.7)
HEMATOCRIT: 31.3 % — AB (ref 36.0–46.0)
HEMOGLOBIN: 10.4 g/dL — AB (ref 12.0–15.0)
LYMPHS PCT: 26 %
Lymphs Abs: 1.5 10*3/uL (ref 0.7–4.0)
MCH: 37.3 pg — AB (ref 26.0–34.0)
MCHC: 33.2 g/dL (ref 30.0–36.0)
MCV: 112.2 fL — ABNORMAL HIGH (ref 78.0–100.0)
MONOS PCT: 4 %
Monocytes Absolute: 0.2 10*3/uL (ref 0.1–1.0)
NEUTROS PCT: 68 %
Neutro Abs: 3.9 10*3/uL (ref 1.7–7.7)
Platelets: 374 10*3/uL (ref 150–400)
RBC: 2.79 MIL/uL — ABNORMAL LOW (ref 3.87–5.11)
RDW: 18.4 % — ABNORMAL HIGH (ref 11.5–15.5)
WBC: 5.7 10*3/uL (ref 4.0–10.5)

## 2016-03-19 LAB — COMPREHENSIVE METABOLIC PANEL
ALBUMIN: 3.7 g/dL (ref 3.5–5.0)
ALK PHOS: 79 U/L (ref 38–126)
ALT: 11 U/L — ABNORMAL LOW (ref 14–54)
ANION GAP: 7 (ref 5–15)
AST: 20 U/L (ref 15–41)
BILIRUBIN TOTAL: 0.6 mg/dL (ref 0.3–1.2)
BUN: 16 mg/dL (ref 6–20)
CALCIUM: 9 mg/dL (ref 8.9–10.3)
CO2: 29 mmol/L (ref 22–32)
Chloride: 104 mmol/L (ref 101–111)
Creatinine, Ser: 0.69 mg/dL (ref 0.44–1.00)
GFR calc non Af Amer: >60 mL/min (ref >60)
GLUCOSE: 105 mg/dL — AB (ref 65–99)
Potassium: 2.7 mmol/L — CL (ref 3.5–5.1)
Sodium: 140 mmol/L (ref 135–145)
TOTAL PROTEIN: 7 g/dL (ref 6.5–8.1)

## 2016-03-19 MED ORDER — CYANOCOBALAMIN 1000 MCG/ML IJ SOLN
INTRAMUSCULAR | Status: AC
  Filled 2016-03-19: qty 1

## 2016-03-19 MED ORDER — CYANOCOBALAMIN 1000 MCG/ML IJ SOLN
1000.0000 ug | Freq: Once | INTRAMUSCULAR | Status: AC
  Administered 2016-03-19: 1000 ug via INTRAMUSCULAR

## 2016-03-19 MED ORDER — POTASSIUM CHLORIDE CRYS ER 20 MEQ PO TBCR: 20.0000 meq | EXTENDED_RELEASE_TABLET | Freq: Three times a day (TID) | ORAL | 2 refills | Status: DC

## 2016-03-19 NOTE — Progress Notes (Unsigned)
CRITICAL VALUE ALERT Critical value received:  Potassium 2.7 Date of notification:  03-19-16 Time of notification: 10:20 Critical value read back:  Yes.   Nurse who received alert:  A.Darnelle Maffucci LPN MD notified (1st page):  Kirby Crigler PA-C

## 2016-03-19 NOTE — Patient Instructions (Signed)
[facility] at [facility] Discharge Instructions  RECOMMENDATIONS MADE BY THE CONSULTANT AND ANY TEST RESULTS WILL BE SENT TO YOUR REFERRING PHYSICIAN.  Received Vit B12 injection today. Follow-up as scheduled. Call clinic for any questions or concerns  Thank you for choosing  [facility] Center at [facility] to provide your oncology and hematology care.  To afford each patient quality time with our provider, please arrive at least 15 minutes before your scheduled appointment time.   Beginning January 23rd 2017 lab work for the Cancer Center will be done in the  Main lab at [place] on 1st floor. If you have a lab appointment with the Cancer Center please come in thru the  Main Entrance and check in at the main information desk  You need to re-schedule your appointment should you arrive 10 or more minutes late.  We strive to give you quality time with our providers, and arriving late affects you and other patients whose appointments are after yours.  Also, if you no show three or more times for appointments you may be dismissed from the clinic at the providers discretion.     Again, thank you for choosing [facility].  Our hope is that these requests will decrease the amount of time that you wait before being seen by our physicians.       _____________________________________________________________  Should you have questions after your visit to [facility], please contact our office at [phone] between the hours of 8:30 a.m. and 4:30 p.m.  Voicemails left after 4:30 p.m. will not be returned until the following business day.  For prescription refill requests, have your pharmacy contact our office.         Resources For Cancer Patients and their Caregivers ? American Cancer Society: Can assist with transportation, wigs, general needs, runs Look Good Feel Better.        1-888-227-6333 ? Cancer Care: Provides  financial assistance, online support groups, medication/co-pay assistance.  1-800-813-HOPE (4673) ? [facility] Assists [place] cancer patients and their families through emotional , educational and financial support.  336-427-4357 ? [place] DSS Where to apply for food stamps, Medicaid and utility assistance. 336-342-1394 ? RCATS: Transportation to medical appointments. 336-347-2287 ? Social Security Administration: May apply for disability if have a Stage IV cancer. 336-342-7796 1-800-772-1213 ? [place] Aging, Disability and Transit Services: Assists with nutrition, care and transit needs. 336-349-2343  Cancer Center Support Programs: @10RELATIVEDAYS@ > Cancer Support Group  2nd Tuesday of the month 1pm-2pm, Journey Room  > Creative Journey  3rd Tuesday of the month 1130am-1pm, Journey Room  > Look Good Feel Better  1st Wednesday of the month 10am-12 noon, Journey Room (Call American Cancer Society to register 1-800-395-5775)   

## 2016-03-19 NOTE — Telephone Encounter (Signed)
Pt had critical potassium level - 2.7 called to clinic, I spoke with Kirby Crigler PA-C he advised the pt needed to start [place] 78mq TID and have labs rechecked in a week.  I advised the pt of this and told her that Amy would call her to schedule her appointment.  Pt verbalized understanding.

## 2016-03-19 NOTE — Progress Notes (Signed)
Ashley [name] tolerated Vit B12 injection well without complaints or incident. VSS Pt discharged self ambulatory in satisfactory condition with daughter

## 2016-03-20 LAB — INTRINSIC FACTOR ANTIBODIES: INTRINSIC FACTOR: 0.9 [AU]/ml (ref 0.0–1.1)

## 2016-03-20 LAB — ANTI-PARIETAL ANTIBODY: PARIETAL CELL ANTIBODY-IGG: 35 U — AB (ref 0.0–20.0)

## 2016-03-26 ENCOUNTER — Encounter (HOSPITAL_BASED_OUTPATIENT_CLINIC_OR_DEPARTMENT_OTHER): Payer: Medicare Other

## 2016-03-26 ENCOUNTER — Encounter (HOSPITAL_COMMUNITY): Payer: Medicare Other

## 2016-03-26 ENCOUNTER — Encounter (HOSPITAL_COMMUNITY): Payer: Self-pay

## 2016-03-26 VITALS — BP 97/74 | HR 79 | Temp 98.3°F | Resp 18

## 2016-03-26 DIAGNOSIS — E538 Deficiency of other specified B group vitamins: Secondary | ICD-10-CM

## 2016-03-26 DIAGNOSIS — E876 Hypokalemia: Secondary | ICD-10-CM

## 2016-03-26 DIAGNOSIS — D473 Essential (hemorrhagic) thrombocythemia: Secondary | ICD-10-CM | POA: Diagnosis not present

## 2016-03-26 LAB — COMPREHENSIVE METABOLIC PANEL
ALBUMIN: 3.8 g/dL (ref 3.5–5.0)
ALT: 12 U/L — ABNORMAL LOW (ref 14–54)
ANION GAP: 6 (ref 5–15)
AST: 24 U/L (ref 15–41)
Alkaline Phosphatase: 87 U/L (ref 38–126)
BILIRUBIN TOTAL: 0.5 mg/dL (ref 0.3–1.2)
BUN: 20 mg/dL (ref 6–20)
CO2: 27 mmol/L (ref 22–32)
Calcium: 9 mg/dL (ref 8.9–10.3)
Chloride: 105 mmol/L (ref 101–111)
Creatinine, Ser: 0.79 mg/dL (ref 0.44–1.00)
GFR calc non Af Amer: >60 mL/min (ref >60)
GLUCOSE: 84 mg/dL (ref 65–99)
POTASSIUM: 3.8 mmol/L (ref 3.5–5.1)
Sodium: 138 mmol/L (ref 135–145)
TOTAL PROTEIN: 7.3 g/dL (ref 6.5–8.1)

## 2016-03-26 MED ORDER — CYANOCOBALAMIN 1000 MCG/ML IJ SOLN
1000.0000 ug | Freq: Once | INTRAMUSCULAR | Status: AC
  Administered 2016-03-26: 1000 ug via INTRAMUSCULAR
  Filled 2016-03-26: qty 1

## 2016-03-26 NOTE — Progress Notes (Signed)
Ashley [name] presents today for injection per MD orders. B12 1,021mg administered IM in right Upper Arm. Administration without incident. Patient tolerated well.  Vitals stable and discharged home from clinic ambulatory.follow up as scheduled.

## 2016-03-26 NOTE — Patient Instructions (Signed)
[facility] at [facility] Discharge Instructions  RECOMMENDATIONS MADE BY THE CONSULTANT AND ANY TEST RESULTS WILL BE SENT TO YOUR REFERRING PHYSICIAN.  B12 injection given today. Follow up as scheduled.  Thank you for choosing [facility] at [facility] to provide your oncology and hematology care.  To afford each patient quality time with our provider, please arrive at least 15 minutes before your scheduled appointment time.   Beginning January 23rd 2017 lab work for the Cancer Center will be done in the  Main lab at [place] on 1st floor. If you have a lab appointment with the Cancer Center please come in thru the  Main Entrance and check in at the main information desk  You need to re-schedule your appointment should you arrive 10 or more minutes late.  We strive to give you quality time with our providers, and arriving late affects you and other patients whose appointments are after yours.  Also, if you no show three or more times for appointments you may be dismissed from the clinic at the providers discretion.     Again, thank you for choosing [facility].  Our hope is that these requests will decrease the amount of time that you wait before being seen by our physicians.       _____________________________________________________________  Should you have questions after your visit to [facility], please contact our office at [phone] between the hours of 8:30 a.m. and 4:30 p.m.  Voicemails left after 4:30 p.m. will not be returned until the following business day.  For prescription refill requests, have your pharmacy contact our office.         Resources For Cancer Patients and their Caregivers ? American Cancer Society: Can assist with transportation, wigs, general needs, runs Look Good Feel Better.        1-888-227-6333 ? Cancer Care: Provides financial assistance, online support groups,  medication/co-pay assistance.  1-800-813-HOPE (4673) ? [facility] Assists [place] cancer patients and their families through emotional , educational and financial support.  336-427-4357 ? [place] DSS Where to apply for food stamps, Medicaid and utility assistance. 336-342-1394 ? RCATS: Transportation to medical appointments. 336-347-2287 ? Social Security Administration: May apply for disability if have a Stage IV cancer. 336-342-7796 1-800-772-1213 ? [place] Aging, Disability and Transit Services: Assists with nutrition, care and transit needs. 336-349-2343  Cancer Center Support Programs: @10RELATIVEDAYS@ > Cancer Support Group  2nd Tuesday of the month 1pm-2pm, Journey Room  > Creative Journey  3rd Tuesday of the month 1130am-1pm, Journey Room  > Look Good Feel Better  1st Wednesday of the month 10am-12 noon, Journey Room (Call American Cancer Society to register 1-800-395-5775)   

## 2016-03-27 LAB — CALR + JAK2 E12-15 + MPL (REFLEXED)

## 2016-03-27 LAB — JAK2 V617F, W REFLEX TO CALR/E12/MPL

## 2016-04-03 ENCOUNTER — Encounter (HOSPITAL_COMMUNITY): Payer: Self-pay

## 2016-04-03 ENCOUNTER — Encounter (HOSPITAL_BASED_OUTPATIENT_CLINIC_OR_DEPARTMENT_OTHER): Payer: Medicare Other

## 2016-04-03 VITALS — BP 100/54 | HR 86 | Temp 98.3°F | Resp 18

## 2016-04-03 DIAGNOSIS — E538 Deficiency of other specified B group vitamins: Secondary | ICD-10-CM

## 2016-04-03 MED ORDER — CYANOCOBALAMIN 1000 MCG/ML IJ SOLN
1000.0000 ug | Freq: Once | INTRAMUSCULAR | Status: AC
  Administered 2016-04-03: 1000 ug via INTRAMUSCULAR
  Filled 2016-04-03: qty 1

## 2016-04-03 NOTE — Patient Instructions (Signed)
[place] at [facility] Discharge Instructions  RECOMMENDATIONS MADE BY THE CONSULTANT AND ANY TEST RESULTS WILL BE SENT TO YOUR REFERRING PHYSICIAN.  B12   Thank you for choosing [place] at [facility] to provide your oncology and hematology care.  To afford each patient quality time with our provider, please arrive at least 15 minutes before your scheduled appointment time.   Beginning January 23rd 2017 lab work for the [facility] will be done in the  Main lab at [facility] on 1st floor. If you have a lab appointment with the [place] please come in thru the  Main Entrance and check in at the main information desk  You need to re-schedule your appointment should you arrive 10 or more minutes late.  We strive to give you quality time with our providers, and arriving late affects you and other patients whose appointments are after yours.  Also, if you no show three or more times for appointments you may be dismissed from the clinic at the providers discretion.     Again, thank you for choosing [facility].  Our hope is that these requests will decrease the amount of time that you wait before being seen by our physicians.       _____________________________________________________________  Should you have questions after your visit to [facility], please contact our office at [phone] between the hours of 8:30 a.m. and 4:30 p.m.  Voicemails left after 4:30 p.m. will not be returned until the following business day.  For prescription refill requests, have your pharmacy contact our office.         Resources For Cancer Patients and their Caregivers ? American Cancer Society: Can assist with transportation, wigs, general needs, runs Look Good Feel Better.        (971)601-9107 ? Cancer Care: Provides financial assistance, online support groups, medication/co-pay assistance.  1-800-813-HOPE  254-149-2440) ? [facility] Assists [place] cancer patients and their families through emotional , educational and financial support.  339-868-5788 ? [place] DSS Where to apply for food stamps, Medicaid and utility assistance. (440) 754-2400 ? RCATS: Transportation to medical appointments. 417-768-9804 ? Social Security Administration: May apply for disability if have a Stage IV cancer. 361-053-5149 620-783-0727 ? [employer], Disability and Transit Services: Assists with nutrition, care and transit needs. [place] Support Programs: '@10RELATIVEDAYS'$ @ > Cancer Support Group  2nd Tuesday of the month 1pm-2pm, Journey Room  > Creative Journey  3rd Tuesday of the month 1130am-1pm, Journey Room  > Look Good Feel Better  1st Wednesday of the month 10am-12 noon, Journey Room (Call [place] to register [id number])

## 2016-04-03 NOTE — Progress Notes (Signed)
Ashley [name] presents today for injection per MD orders. B12 1034mg administered IM in right Upper Arm. Administration without incident. Patient tolerated well.

## 2016-04-12 ENCOUNTER — Other Ambulatory Visit (HOSPITAL_COMMUNITY): Payer: Self-pay | Admitting: Oncology

## 2016-04-12 ENCOUNTER — Encounter (HOSPITAL_COMMUNITY): Payer: Self-pay | Admitting: Oncology

## 2016-04-12 ENCOUNTER — Encounter (HOSPITAL_BASED_OUTPATIENT_CLINIC_OR_DEPARTMENT_OTHER): Payer: Medicare Other

## 2016-04-12 ENCOUNTER — Encounter (HOSPITAL_COMMUNITY): Payer: Self-pay

## 2016-04-12 VITALS — BP 112/66 | HR 50 | Temp 97.4°F | Resp 16

## 2016-04-12 DIAGNOSIS — D510 Vitamin B12 deficiency anemia due to intrinsic factor deficiency: Secondary | ICD-10-CM

## 2016-04-12 DIAGNOSIS — E538 Deficiency of other specified B group vitamins: Secondary | ICD-10-CM | POA: Diagnosis present

## 2016-04-12 MED ORDER — CYANOCOBALAMIN 1000 MCG/ML IJ SOLN
1000.0000 ug | Freq: Once | INTRAMUSCULAR | Status: AC
  Administered 2016-04-12: 1000 ug via INTRAMUSCULAR

## 2016-04-12 MED ORDER — CYANOCOBALAMIN 1000 MCG/ML IJ SOLN
INTRAMUSCULAR | Status: AC
  Filled 2016-04-12: qty 1

## 2016-04-12 MED ORDER — MISC. DEVICES MISC: 0 refills | Status: DC

## 2016-04-12 MED ORDER — CYANOCOBALAMIN 1000 MCG/ML IJ SOLN: 1000.0000 ug | Freq: Once | INTRAMUSCULAR | 5 refills | Status: AC

## 2016-04-12 NOTE — Patient Instructions (Signed)
[facility] at [facility] Discharge Instructions  RECOMMENDATIONS MADE BY THE CONSULTANT AND ANY TEST RESULTS WILL BE SENT TO YOUR REFERRING PHYSICIAN.  B12 injection today Follow up as scheduled.  Thank you for choosing [facility] at [facility] to provide your oncology and hematology care.  To afford each patient quality time with our provider, please arrive at least 15 minutes before your scheduled appointment time.    If you have a lab appointment with the Cancer Center please come in thru the  Main Entrance and check in at the main information desk  You need to re-schedule your appointment should you arrive 10 or more minutes late.  We strive to give you quality time with our providers, and arriving late affects you and other patients whose appointments are after yours.  Also, if you no show three or more times for appointments you may be dismissed from the clinic at the providers discretion.     Again, thank you for choosing [facility].  Our hope is that these requests will decrease the amount of time that you wait before being seen by our physicians.       _____________________________________________________________  Should you have questions after your visit to [facility], please contact our office at [phone] between the hours of 8:30 a.m. and 4:30 p.m.  Voicemails left after 4:30 p.m. will not be returned until the following business day.  For prescription refill requests, have your pharmacy contact our office.       Resources For Cancer Patients and their Caregivers ? American Cancer Society: Can assist with transportation, wigs, general needs, runs Look Good Feel Better.        1-888-227-6333 ? Cancer Care: Provides financial assistance, online support groups, medication/co-pay assistance.  1-800-813-HOPE (4673) ? [facility] Assists [place] cancer patients and  their families through emotional , educational and financial support.  336-427-4357 ? [place] DSS Where to apply for food stamps, Medicaid and utility assistance. 336-342-1394 ? RCATS: Transportation to medical appointments. 336-347-2287 ? Social Security Administration: May apply for disability if have a Stage IV cancer. 336-342-7796 1-800-772-1213 ? [place] Aging, Disability and Transit Services: Assists with nutrition, care and transit needs. 336-349-2343  Cancer Center Support Programs: @10RELATIVEDAYS@ > Cancer Support Group  2nd Tuesday of the month 1pm-2pm, Journey Room  > Creative Journey  3rd Tuesday of the month 1130am-1pm, Journey Room  > Look Good Feel Better  1st Wednesday of the month 10am-12 noon, Journey Room (Call American Cancer Society to register 1-800-395-5775)   

## 2016-04-12 NOTE — Progress Notes (Signed)
Ashley [name] presents today for injection per MD orders. B12 1,000 administered IM in right Upper Arm. Administration without incident. Patient tolerated well.  Prescriptions given today for monthly B12 injections and syringes and needles. Patient stated that she knew how to give herself the injections as well as her daughter could give them to her also.   Vitals stable and discharged ambulatory from clinic. Follow up as scheduled.

## 2016-05-01 ENCOUNTER — Other Ambulatory Visit (HOSPITAL_COMMUNITY): Payer: Self-pay | Admitting: Nurse Practitioner

## 2016-05-01 DIAGNOSIS — R918 Other nonspecific abnormal finding of lung field: Secondary | ICD-10-CM

## 2016-05-01 DIAGNOSIS — I714 Abdominal aortic aneurysm, without rupture, unspecified: Secondary | ICD-10-CM

## 2016-05-01 DIAGNOSIS — I7300 Raynaud's syndrome without gangrene: Secondary | ICD-10-CM

## 2016-05-10 ENCOUNTER — Ambulatory Visit (HOSPITAL_COMMUNITY)
Admission: RE | Admit: 2016-05-10 | Discharge: 2016-05-10 | Disposition: A | Discharge status: Home / Self Care | Payer: Medicare Other | Source: Ambulatory Visit | Attending: Nurse Practitioner | Admitting: Nurse Practitioner

## 2016-05-10 DIAGNOSIS — K5790 Diverticulosis of intestine, part unspecified, without perforation or abscess without bleeding: Secondary | ICD-10-CM | POA: Diagnosis not present

## 2016-05-10 DIAGNOSIS — K8689 Other specified diseases of pancreas: Secondary | ICD-10-CM | POA: Diagnosis not present

## 2016-05-10 DIAGNOSIS — R918 Other nonspecific abnormal finding of lung field: Secondary | ICD-10-CM

## 2016-05-10 DIAGNOSIS — K769 Liver disease, unspecified: Secondary | ICD-10-CM | POA: Insufficient documentation

## 2016-05-10 DIAGNOSIS — R590 Localized enlarged lymph nodes: Secondary | ICD-10-CM | POA: Insufficient documentation

## 2016-05-10 DIAGNOSIS — I7300 Raynaud's syndrome without gangrene: Secondary | ICD-10-CM | POA: Diagnosis not present

## 2016-05-10 DIAGNOSIS — J432 Centrilobular emphysema: Secondary | ICD-10-CM | POA: Diagnosis not present

## 2016-05-10 DIAGNOSIS — I700 Atherosclerosis of aorta: Secondary | ICD-10-CM | POA: Insufficient documentation

## 2016-05-10 DIAGNOSIS — I714 Abdominal aortic aneurysm, without rupture, unspecified: Secondary | ICD-10-CM

## 2016-05-10 DIAGNOSIS — I2510 Atherosclerotic heart disease of native coronary artery without angina pectoris: Secondary | ICD-10-CM | POA: Diagnosis not present

## 2016-05-10 DIAGNOSIS — D3501 Benign neoplasm of right adrenal gland: Secondary | ICD-10-CM | POA: Diagnosis not present

## 2016-05-10 LAB — POCT I-STAT CREATININE: Creatinine, Ser: 1.6 mg/dL — ABNORMAL HIGH (ref 0.44–1.00)

## 2016-05-10 MED ORDER — IOPAMIDOL (ISOVUE-370) INJECTION 76%
100.0000 mL | Freq: Once | INTRAVENOUS | Status: AC | PRN
  Administered 2016-05-10: 75 mL via INTRAVENOUS

## 2016-05-15 ENCOUNTER — Telehealth (HOSPITAL_COMMUNITY): Payer: Self-pay | Admitting: *Deleted

## 2016-05-15 NOTE — Telephone Encounter (Signed)
Spoke with Angelina Ok at [facility] family [facility] and she advised that she was sending patient for PET scan for follow up of CT scan results.

## 2016-05-16 ENCOUNTER — Other Ambulatory Visit (HOSPITAL_COMMUNITY): Payer: Self-pay | Admitting: Nurse Practitioner

## 2016-05-16 DIAGNOSIS — K769 Liver disease, unspecified: Secondary | ICD-10-CM

## 2016-05-17 ENCOUNTER — Other Ambulatory Visit (HOSPITAL_COMMUNITY): Payer: Self-pay | Admitting: Oncology

## 2016-05-17 ENCOUNTER — Other Ambulatory Visit (HOSPITAL_COMMUNITY): Payer: Self-pay | Admitting: Hematology & Oncology

## 2016-05-17 DIAGNOSIS — E876 Hypokalemia: Secondary | ICD-10-CM

## 2016-05-22 ENCOUNTER — Encounter: Payer: Self-pay | Admitting: Internal Medicine

## 2016-05-22 ENCOUNTER — Telehealth (HOSPITAL_COMMUNITY): Payer: Self-pay | Admitting: *Deleted

## 2016-05-22 ENCOUNTER — Other Ambulatory Visit (HOSPITAL_COMMUNITY): Payer: Self-pay

## 2016-05-22 DIAGNOSIS — C50912 Malignant neoplasm of unspecified site of left female breast: Secondary | ICD-10-CM

## 2016-05-22 MED ORDER — CLONAZEPAM 0.5 MG PO TABS: 0.5000 mg | ORAL_TABLET | Freq: Every day | ORAL | 2 refills | Status: DC

## 2016-05-22 NOTE — Telephone Encounter (Signed)
Refilled klonopin, just need you to sign it.   MB

## 2016-05-22 NOTE — Telephone Encounter (Signed)
Received refill request from patient for klonopin. Chart checked , reviewed with provider and refilled.

## 2016-05-23 ENCOUNTER — Ambulatory Visit (HOSPITAL_COMMUNITY): Payer: Medicare Other | Admitting: Hematology & Oncology

## 2016-05-23 ENCOUNTER — Encounter (HOSPITAL_COMMUNITY): Payer: Medicare Other | Attending: Oncology

## 2016-05-23 DIAGNOSIS — C4372 Malignant melanoma of left lower limb, including hip: Secondary | ICD-10-CM | POA: Diagnosis not present

## 2016-05-23 DIAGNOSIS — C50912 Malignant neoplasm of unspecified site of left female breast: Secondary | ICD-10-CM | POA: Diagnosis not present

## 2016-05-23 DIAGNOSIS — D473 Essential (hemorrhagic) thrombocythemia: Secondary | ICD-10-CM | POA: Insufficient documentation

## 2016-05-23 LAB — CBC WITH DIFFERENTIAL/PLATELET
BASOS ABS: 0 10*3/uL (ref 0.0–0.1)
BASOS PCT: 0 %
EOS ABS: 0.2 10*3/uL (ref 0.0–0.7)
Eosinophils Relative: 3 %
HCT: 28.6 % — ABNORMAL LOW (ref 36.0–46.0)
Hemoglobin: 9.9 g/dL — ABNORMAL LOW (ref 12.0–15.0)
LYMPHS PCT: 34 %
Lymphs Abs: 1.7 10*3/uL (ref 0.7–4.0)
MCH: 38.8 pg — ABNORMAL HIGH (ref 26.0–34.0)
MCHC: 34.6 g/dL (ref 30.0–36.0)
MCV: 112.2 fL — ABNORMAL HIGH (ref 78.0–100.0)
MONO ABS: 0.4 10*3/uL (ref 0.1–1.0)
Monocytes Relative: 7 %
NEUTROS ABS: 2.7 10*3/uL (ref 1.7–7.7)
NEUTROS PCT: 56 %
PLATELETS: 405 10*3/uL — AB (ref 150–400)
RBC: 2.55 MIL/uL — ABNORMAL LOW (ref 3.87–5.11)
RDW: 16.5 % — ABNORMAL HIGH (ref 11.5–15.5)
WBC: 5 10*3/uL (ref 4.0–10.5)

## 2016-05-23 LAB — COMPREHENSIVE METABOLIC PANEL
ALBUMIN: 3.9 g/dL (ref 3.5–5.0)
ALT: 14 U/L (ref 14–54)
ANION GAP: 7 (ref 5–15)
AST: 23 U/L (ref 15–41)
Alkaline Phosphatase: 61 U/L (ref 38–126)
BUN: 18 mg/dL (ref 6–20)
CHLORIDE: 102 mmol/L (ref 101–111)
CO2: 27 mmol/L (ref 22–32)
Calcium: 8.9 mg/dL (ref 8.9–10.3)
Creatinine, Ser: 0.87 mg/dL (ref 0.44–1.00)
GFR calc non Af Amer: >60 mL/min (ref >60)
GLUCOSE: 109 mg/dL — AB (ref 65–99)
Potassium: 3.5 mmol/L (ref 3.5–5.1)
SODIUM: 136 mmol/L (ref 135–145)
Total Bilirubin: 0.4 mg/dL (ref 0.3–1.2)
Total Protein: 7.3 g/dL (ref 6.5–8.1)

## 2016-05-25 ENCOUNTER — Encounter (HOSPITAL_COMMUNITY)
Admission: RE | Admit: 2016-05-25 | Discharge: 2016-05-25 | Disposition: A | Discharge status: Home / Self Care | Payer: Medicare Other | Source: Ambulatory Visit | Attending: Nurse Practitioner | Admitting: Nurse Practitioner

## 2016-05-25 DIAGNOSIS — K7689 Other specified diseases of liver: Secondary | ICD-10-CM | POA: Diagnosis not present

## 2016-05-25 DIAGNOSIS — K769 Liver disease, unspecified: Secondary | ICD-10-CM

## 2016-05-25 LAB — GLUCOSE, CAPILLARY: Glucose-Capillary: 104 mg/dL — ABNORMAL HIGH (ref 65–99)

## 2016-05-25 MED ORDER — FLUDEOXYGLUCOSE F - 18 (FDG) INJECTION
5.8700 | Freq: Once | INTRAVENOUS | Status: AC | PRN
  Administered 2016-05-25: 5.87 via INTRAVENOUS

## 2016-06-14 ENCOUNTER — Encounter: Payer: Self-pay | Admitting: Cardiovascular Disease

## 2016-06-18 ENCOUNTER — Other Ambulatory Visit (HOSPITAL_COMMUNITY): Payer: Self-pay | Admitting: Oncology

## 2016-06-18 DIAGNOSIS — Z1231 Encounter for screening mammogram for malignant neoplasm of breast: Secondary | ICD-10-CM

## 2016-06-20 ENCOUNTER — Encounter (HOSPITAL_COMMUNITY): Payer: Medicare Other | Attending: Hematology & Oncology | Admitting: Oncology

## 2016-06-20 ENCOUNTER — Encounter (HOSPITAL_COMMUNITY): Payer: Self-pay

## 2016-06-20 VITALS — BP 114/79 | HR 86 | Temp 97.8°F | Resp 18 | Wt 118.4 lb

## 2016-06-20 DIAGNOSIS — D473 Essential (hemorrhagic) thrombocythemia: Secondary | ICD-10-CM | POA: Insufficient documentation

## 2016-06-20 DIAGNOSIS — Z853 Personal history of malignant neoplasm of breast: Secondary | ICD-10-CM

## 2016-06-20 DIAGNOSIS — E876 Hypokalemia: Secondary | ICD-10-CM | POA: Insufficient documentation

## 2016-06-20 DIAGNOSIS — C50912 Malignant neoplasm of unspecified site of left female breast: Secondary | ICD-10-CM | POA: Insufficient documentation

## 2016-06-20 DIAGNOSIS — E538 Deficiency of other specified B group vitamins: Secondary | ICD-10-CM | POA: Insufficient documentation

## 2016-06-20 DIAGNOSIS — Z85820 Personal history of malignant melanoma of skin: Secondary | ICD-10-CM | POA: Diagnosis not present

## 2016-06-20 NOTE — Patient Instructions (Signed)
[place] at [facility] Discharge Instructions  RECOMMENDATIONS MADE BY THE CONSULTANT AND ANY TEST RESULTS WILL BE SENT TO YOUR REFERRING PHYSICIAN.  You were seen today by Dr. Twana First Follow up in 3 months with lab work See Amy up front for appointments   Thank you for choosing [place] at [facility] to provide your oncology and hematology care.  To afford each patient quality time with our provider, please arrive at least 15 minutes before your scheduled appointment time.    If you have a lab appointment with the [facility] please come in thru the  Main Entrance and check in at the main information desk  You need to re-schedule your appointment should you arrive 10 or more minutes late.  We strive to give you quality time with our providers, and arriving late affects you and other patients whose appointments are after yours.  Also, if you no show three or more times for appointments you may be dismissed from the clinic at the providers discretion.     Again, thank you for choosing [facility]  Our hope is that these requests will decrease the amount of time that you wait before being seen by our physicians.       _____________________________________________________________  Should you have questions after your visit to [facility], please contact our office at [phone] between the hours of 8:30 a.m. and 4:30 p.m.  Voicemails left after 4:30 p.m. will not be returned until the following business day.  For prescription refill requests, have your pharmacy contact our office.       Resources For Cancer Patients and their Caregivers ? American Cancer Society: Can assist with transportation, wigs, general needs, runs Look Good Feel Better.        548-756-2637 ? Cancer Care: Provides financial assistance, online support groups, medication/co-pay assistance.  1-800-813-HOPE 818-329-0619) ? [place] Assists [place] cancer patients and their families through emotional , educational and financial support.  703-744-6593 ? [place] DSS Where to apply for food stamps, Medicaid and utility assistance. 2405541024 ? [facility]: Transportation to medical appointments. 669-627-0825 ? Social Security Administration: May apply for disability if have a Stage IV cancer. (262) 615-9027 (786) 032-6399 ? [employer], Disability and Transit Services: Assists with nutrition, care and transit needs. [place] Support Programs: '@10RELATIVEDAYS'$ @ > Cancer Support Group  2nd Tuesday of the month 1pm-2pm, Journey Room  > Creative Journey  3rd Tuesday of the month 1130am-1pm, Journey Room  > Look Good Feel Better  1st Wednesday of the month 10am-12 noon, Journey Room (Call [place] to register [phone])

## 2016-06-20 NOTE — Progress Notes (Signed)
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 The 3 liver lesions are not hypermetabolic. The radiologist suspects they may be subtly present on prior CT chest from 10/2013, to further reassuring that these are likely benign lesions.       Melanoma ([place])   07/09/2013 Initial Biopsy    Initial biopsy L upper thigh/buttocks melanoma      07/20/2013 Surgery    Excision L lateral buttock/upper thigh melanoma, clear margins      07/20/2013 Pathology Results    Breslow depth 1.02 mm, pT2a        HISTORY OF PRESENTING ILLNESS:  Ashley [name] 66 y.o. female is here because of Essential thrombocytosis. She also has a history of breast cancer and melanoma.   She has been doing well. She is doing well on Hydrea. She is very tired on it, but otherwise tolerates it well. Pt says that she is eating well, but her daughter, whom she lives with, says that she is really not eating well and is losing weight. She has lost 20 pounds since August. She has a lot of neck and back pain. She has stenosis and had surgery and cortisone injections in the past. She does not want any more injections, and might need further surgery. Denies nausea, vomiting, chest pain, SOB, abdominal pain, or any other concerns.   MEDICAL HISTORY:  Past Medical History:    Diagnosis Date  . Arthritis   . Ascending aortic aneurysm ([place])   . Cancer [facility]) 1995   breast, left, mastectomy/chemo  . Chest pain    Possibly cardiac. No evidence of ischemia/injury based upon normal troponin I. Chest discomfort could be tachycardia induced supply demand mismatch.   . Colon adenomas   . Coronary artery disease   . Essential hypertension, benign   . GERD (gastroesophageal reflux disease)   . Hyperlipidemia   . Hypertension   . Palpitations   . Pernicious anemia 03/06/2016  . Pure hypercholesterolemia   . Thrombocytosis ([place])    Idiopathic    SURGICAL HISTORY: Past Surgical History:  Procedure Laterality Date  . ANTERIOR AND POSTERIOR REPAIR N/A 12/09/2014   Procedure: ANTERIOR (CYSTOCELE) AND POSTERIOR REPAIR (RECTOCELE);  Surgeon: Bjorn Loser, MD;  Location: [facility];  Service: Urology;  Laterality: N/A;  . ANTERIOR LAT LUMBAR FUSION Left 11/16/2015   Procedure: LEFT SIDED LATERAL INTERBODY FUSION, LUMBAR 2-3, LUMBAR 3-4, LUMBAR 4-5 WITH INSTRUMENTATION;  Surgeon: Phylliss Bob, MD;  Location: [facility];  Service: Orthopedics;  Laterality: Left;  LEFT SIDED LATEARL INTERBODY FUSION, LUMBAR 2-3, LUMBAR 3-4, LUMBAR 4-5 WITH INSTRUMENTATION   . APPENDECTOMY    . BONE MARROW ASPIRATION  07/2012  . BONE MARROW BIOPSY  07/2012  .  BREAST SURGERY    . CARDIAC CATHETERIZATION    . CARDIAC CATHETERIZATION N/A 01/20/2016   Procedure: Left Heart Cath and Coronary Angiography;  Surgeon: Burnell Blanks, MD;  Location: [facility];  Service: Cardiovascular;  Laterality: N/A;  . COLONOSCOPY  11/29/2010   Procedure: COLONOSCOPY;  Surgeon: Rogene Houston, MD;  [facility];  Service: Endoscopy;  Laterality: N/A;  . COLONOSCOPY N/A 02/18/2014   Procedure: COLONOSCOPY;  Surgeon: Rogene Houston, MD;  [facility];  Service: Endoscopy;  Laterality: N/A;  1030  . CYSTOSCOPY N/A 12/09/2014   Procedure: CYSTOSCOPY;  Surgeon: Bjorn Loser, MD;   Location: [facility];  Service: Urology;  Laterality: N/A;  . IR GENERIC HISTORICAL  01/11/2016   IR RADIOLOGY PERIPHERAL GUIDED IV START 01/11/2016 Saverio Danker, PA-C [facility]  . IR GENERIC HISTORICAL  01/11/2016   IR US GUIDE VASC ACCESS RIGHT 01/11/2016 Saverio Danker, PA-C [facility]  . MASTECTOMY     left  . OVARIAN CYST SURGERY     x2  . SALPINGOOPHORECTOMY Bilateral 12/09/2014   Procedure: SALPINGO OOPHORECTOMY;  Surgeon: Servando Salina, MD;  Location: [facility];  Service: Gynecology;  Laterality: Bilateral;  . TUBAL LIGATION    . VAGINAL HYSTERECTOMY N/A 12/09/2014   Procedure: HYSTERECTOMY VAGINAL;  Surgeon: Servando Salina, MD;  Location: [facility];  Service: Gynecology;  Laterality: N/A;    SOCIAL HISTORY: Social History   Social History  . Marital status: Divorced    Spouse name: N/A  . Number of children: 2  . Years of education: N/A   Occupational History  . Not on file.   Social History Main Topics  . Smoking status: Current Every Day Smoker    Packs/day: 0.50    Years: 50.00    Types: Cigarettes  . Smokeless tobacco: Never Used  . Alcohol use No  . Drug use: No  . Sexual activity: Yes    Birth control/ protection: Post-menopausal   Other Topics Concern  . Not on file   Social History Narrative  . No narrative on file   Divorced 2 children 4 grandchildren 1 great grandchild Smoker ETOH, social. Daughter notes 3 a year. She has no hobbies right now. She does like yard work. 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 Hypertension Mother   . Heart failure Mother   . Congestive Heart Failure Mother   . COPD Mother   . Pernicious anemia Mother   . Cancer Mother     lung  . Hypertension Father   . CAD Father   . Heart attack Father   . Hypertension Sister   . Cancer Other   . Celiac disease Other    Mother is still  living at 90 yo, smokes, and weighs 87 lbs. Father deceased at 7 yo of heart attack 1 sister, she has high blood pressure  ALLERGIES:  is allergic to penicillins and tape.  MEDICATIONS:  Current Outpatient Prescriptions  Medication Sig Dispense Refill  . CYANOCOBALAMIN IJ Inject 1,000 mcg as directed every 30 (thirty) days.    . methocarbamol (ROBAXIN) 500 MG tablet Take 500 mg by mouth every 6 (six) hours as needed for muscle spasms.    . traMADol (ULTRAM) 50 MG tablet Take 50-100 mg by mouth 3 (three) times daily as needed.    Marland Kitchen  aspirin EC 81 MG tablet Take 81 mg by mouth daily.    . Calcium Carb-Cholecalciferol (CALCIUM 600+D) 600-800 MG-UNIT TABS Take 1 tablet by mouth 2 (two) times daily.    . Cholecalciferol (VITAMIN D-3) 1000 units CAPS Take 1 capsule by mouth daily.    . clonazePAM (KLONOPIN) 0.5 MG tablet Take 1 tablet (0.5 mg total) by mouth at bedtime. 30 tablet 2  . furosemide (LASIX) 40 MG tablet Take 1 tablet (40 mg total) by mouth daily as needed for edema. 30 tablet 0  . gabapentin (NEURONTIN) 300 MG capsule Take 300 mg by mouth at bedtime.    . hydroxyurea (HYDREA) 500 MG capsule 500 mg PO Mon and Tues and 1000 mg PO Wed- Sun (Patient taking differently: Take 500-1,000 mg by mouth See admin instructions. 1,000 mg on Sun/Wed/Thurs/Fri/Sat and 500 mg on Mon/Tues) 50 capsule 0  . ibandronate (BONIVA) 150 MG tablet Take 1 tablet by mouth every 30 (thirty) days.    Marland Kitchen ipratropium-albuterol (DUONEB) 0.5-2.5 (3) MG/3ML SOLN Take 3 mLs by nebulization every 6 (six) hours as needed (shortness of breath). 360 mL 0  . irbesartan-hydrochlorothiazide (AVALIDE) 150-12.5 MG tablet Take 1 tablet by mouth daily. Please hold if blood pressure less than 140/90. 30 tablet 0  . omeprazole (PRILOSEC OTC) 20 MG tablet Take 20 mg by mouth daily.    Vladimir Faster Glycol-Propyl Glycol (SYSTANE) 0.4-0.3 % GEL ophthalmic gel Place 1 application into both eyes at bedtime.    . potassium chloride SA  (K-DUR,KLOR-CON) 20 MEQ tablet TAKE ONE TABLET BY MOUTH 3 TIMES A DAY. 60 tablet 0  . rosuvastatin (CRESTOR) 40 MG tablet Take 40 mg by mouth daily.       No current facility-administered medications for this visit.     Review of Systems  Constitutional: Positive for malaise/fatigue and weight loss.       Loss of appetite.   HENT: Negative.   Eyes: Negative.   Respiratory: Negative.  Negative for shortness of breath.   Cardiovascular: Negative.  Negative for chest pain.  Gastrointestinal: Negative.  Negative for abdominal pain, nausea and vomiting.  Genitourinary: Negative.   Musculoskeletal: Positive for back pain and neck pain.  Skin: Negative.   Neurological: Negative.   Endo/Heme/Allergies: Negative.   Psychiatric/Behavioral: Negative.   All other systems reviewed and are negative. 14 point ROS was done and is otherwise as detailed above or in HPI  PHYSICAL EXAMINATION: ECOG PERFORMANCE STATUS: 2 - Symptomatic, <50% confined to bed  Vitals:   06/20/16 1531  BP: 114/79  Pulse: 86  Resp: 18  Temp: 97.8 F (36.6 C)   Filed Weights   06/20/16 1531  Weight: 118 lb 6.4 oz (53.7 kg)    Physical Exam  Constitutional: She is oriented to person, place, and time and well-developed, well-nourished, and in no distress.  HENT:  Head: Normocephalic and atraumatic.  Nose: Nose normal.  Mouth/Throat: Oropharynx is clear and moist. No oropharyngeal exudate.  Eyes: Conjunctivae and EOM are normal. Pupils are equal, round, and reactive to light. Right eye exhibits no discharge. Left eye exhibits no discharge. No scleral icterus.  Neck: Normal range of motion. Neck supple. No tracheal deviation present. No thyromegaly present.  Cardiovascular: Normal rate, regular rhythm and normal heart sounds.  Exam reveals no gallop and no friction rub.   No murmur heard. Pulmonary/Chest: Effort normal and breath sounds normal. She has no wheezes. She has no rales.  Abdominal: Soft. Bowel sounds  are normal. She exhibits  no distension and no mass. There is no tenderness. There is no rebound and no guarding.  Musculoskeletal: Normal range of motion. She exhibits no edema.  Lymphadenopathy:    She has no cervical adenopathy.  Neurological: She is alert and oriented to person, place, and time. No cranial nerve deficit. Gait normal. Coordination normal.  Skin: Skin is warm and dry. No rash noted.  Psychiatric: Mood, memory, affect and judgment normal.  Nursing note and vitals reviewed.  LABORATORY DATA:  I have reviewed the data as listed Lab Results  Component Value Date   WBC 5.0 05/23/2016   HGB 9.9 (L) 05/23/2016   HCT 28.6 (L) 05/23/2016   MCV 112.2 (H) 05/23/2016   PLT 405 (H) 05/23/2016   CMP     Component Value Date/Time   NA 136 05/23/2016 1252   K 3.5 05/23/2016 1252   CL 102 05/23/2016 1252   CO2 27 05/23/2016 1252   GLUCOSE 109 (H) 05/23/2016 1252   BUN 18 05/23/2016 1252   CREATININE 0.87 05/23/2016 1252   CALCIUM 8.9 05/23/2016 1252   PROT 7.3 05/23/2016 1252   ALBUMIN 3.9 05/23/2016 1252   AST 23 05/23/2016 1252   ALT 14 05/23/2016 1252   ALKPHOS 61 05/23/2016 1252   BILITOT 0.4 05/23/2016 1252   GFRNONAA >60 05/23/2016 1252   GFRAA >60 05/23/2016 1252    RADIOGRAPHIC STUDIES: I have personally reviewed the radiological images as listed and agreed with the findings in the report.  Initial PET scan 05/25/2016 IMPRESSION: 1. No findings of active malignancy in the neck, chest, abdomen, or pelvis. The 3 liver lesions are not hypermetabolic. Moreover, I suspect that they may be subtly present on the prior CT chest from 10/23/2013, further reassuring that these are likely benign. 2. The ascending aorta measures 4.2 cm in diameter. Recommend annual imaging followup by CTA or MRA. This recommendation follows 2010 ACCF/AHA/AATS/ACR/ASA/SCA/SCAI/SIR/STS/SVM Guidelines for the Diagnosis and Management of Patients with Thoracic Aortic Disease. Circulation.  2010; 121: Q761-P509 3. Other imaging findings of potential clinical significance: Coronary, aortic arch, and branch vessel atherosclerotic vascular disease. Prominent emphysema. Several subpleural nodules along the major fissures have no significant accentuated metabolic activity and are probably benign. Right adrenal adenoma. 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 Postoperative findings in the lumbar spine.  CT Angio 05/10/2016 IMPRESSION: There are 3 small hypodense lesions of the right liver lobe, as above, concerning for metastatic disease. Further evaluation with PET-CT or contrast-enhanced MRI may be considered.  Small lung nodules are again demonstrated, measuring smaller than the comparison chest CT, demonstrating benign behavior.  Paraseptal and centrilobular emphysema.  Aortic atherosclerosis and coronary artery disease. No evidence of significant stenosis at the origin of any of the mesenteric vessels, renal vessels, or the iliac vasculature. No abdominal aortic aneurysm.  Calcifications of the pancreas compatible with chronic pancreatitis.  Diverticular disease without evidence of acute diverticulitis.  Right adrenal adenoma again noted.  Nodule of the medial limb left adrenal gland, indeterminate, measuring 12 mm in greatest diameter.  Upper abdominal lymph nodes in the periaortic/preaortic nodal station, unchanged.  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 Goal platelet count is < 450K.  I encouraged annual dermatology visits due to her history of melanoma.   I recommended follow up with her orthopedic surgeon at her scheduled appointment for discussions regarding treatment for  her spinal stenosis since she is having a lot of pain.   Annual mammograms.  She will return for  follow up in 3 months with labs; CBC, CMP  ORDERS PLACED FOR THIS ENCOUNTER: No orders of the defined types were placed in this encounter.  No orders of the defined types were placed in this encounter.  MEDICATIONS PRESCRIBED THIS ENCOUNTER: Meds ordered this encounter  Medications  . CYANOCOBALAMIN IJ    Sig: Inject 1,000 mcg as directed every 30 (thirty) days.  . methocarbamol (ROBAXIN) 500 MG tablet    Sig: Take 500 mg by mouth every 6 (six) hours as needed for muscle spasms.  . traMADol (ULTRAM) 50 MG tablet    Sig: Take 50-100 mg by mouth 3 (three) times daily as needed.   No orders of the defined types were placed in this encounter.  All questions were answered. The patient knows to call the clinic with any problems, questions or concerns.  This document serves as a record of services personally performed by Twana First, MD. It was created on her behalf by Martinique Casey, a trained medical scribe. The creation of this record is based on the scribe's personal observations and the provider's statements to them. This document has been checked and approved by the attending provider.  I have reviewed the above documentation for accuracy and completeness and I agree with the above.  This note was electronically signed.    Martinique M Casey  06/20/2016 3:57 PM

## 2016-06-21 ENCOUNTER — Encounter: Payer: Self-pay | Admitting: Cardiovascular Disease

## 2016-06-21 ENCOUNTER — Ambulatory Visit (INDEPENDENT_AMBULATORY_CARE_PROVIDER_SITE_OTHER): Payer: Medicare Other | Admitting: Cardiovascular Disease

## 2016-06-21 VITALS — BP 100/70 | HR 88 | Ht 62.0 in | Wt 116.0 lb

## 2016-06-21 DIAGNOSIS — I2510 Atherosclerotic heart disease of native coronary artery without angina pectoris: Secondary | ICD-10-CM

## 2016-06-21 DIAGNOSIS — E7800 Pure hypercholesterolemia, unspecified: Secondary | ICD-10-CM | POA: Diagnosis not present

## 2016-06-21 DIAGNOSIS — I10 Essential (primary) hypertension: Secondary | ICD-10-CM

## 2016-06-21 DIAGNOSIS — I712 Thoracic aortic aneurysm, without rupture, unspecified: Secondary | ICD-10-CM

## 2016-06-21 DIAGNOSIS — I428 Other cardiomyopathies: Secondary | ICD-10-CM

## 2016-06-21 NOTE — Patient Instructions (Signed)

## 2016-06-21 NOTE — Progress Notes (Signed)
Chief Complaint  Patient presents with  . 4 month follow up    History of Present Illness: 66 yo female with history of coronary vasospasm, systolic CHF, HTN, tobacco abuse, GERD, breast cancer who is here today for follow up. I saw her as a new patient for workup of exertional dyspnea July 2017.  She had a cardiac cath in 1997 with no CAD reported. She was told she may have coronary spasm. She has an ascending aortic aneurysm followed in primary care. Last CTA in Sept 2016 with 4.2 cm ascending aorta. She has smoked 1/2 ppd for 45 years. Nuclear stress test July 2017 with no ischemia. Coronary CTA September 2017 with mild to moderate CAD noted. Cardiac cath 01/20/16 with 30% ostial RCA stenosis, 30% proximal to mid Circumflex stenosis, mild left main and LAD plaque. Normal LV filling pressures. She has been followed in oncology with history of breast CA and over the last year has had weight loss, loss of appetite, hypotension, anemia. PE has been ruled out as cause of her dyspnea. She declined referral to see a pulmonologist.    She is here today for follow up. She has no chest pain, dyspnea, near syncope or syncope. She is mostly limited by back pain.    Primary Care Physician: [name], NP   Past Medical History:  Diagnosis Date  . Arthritis   . Ascending aortic aneurysm ([place])   . Cancer [facility]) 1995   breast, left, mastectomy/chemo  . Chest pain    Possibly cardiac. No evidence of ischemia/injury based upon normal troponin I. Chest discomfort could be tachycardia induced supply demand mismatch.   . Colon adenomas   . Coronary artery disease   . Essential hypertension, benign   . GERD (gastroesophageal reflux disease)   . Hyperlipidemia   . Hypertension   . Palpitations   . Pernicious anemia 03/06/2016  . Pure hypercholesterolemia   . Thrombocytosis ([place])    Idiopathic    Past Surgical History:  Procedure Laterality Date  . ANTERIOR AND POSTERIOR REPAIR N/A 12/09/2014     Procedure: ANTERIOR (CYSTOCELE) AND POSTERIOR REPAIR (RECTOCELE);  Surgeon: Bjorn Loser, MD;  Location: [place] [facility];  Service: Urology;  Laterality: N/A;  . ANTERIOR LAT LUMBAR FUSION Left 11/16/2015   Procedure: LEFT SIDED LATERAL INTERBODY FUSION, LUMBAR 2-3, LUMBAR 3-4, LUMBAR 4-5 WITH INSTRUMENTATION;  Surgeon: Phylliss Bob, MD;  Location: [place];  Service: Orthopedics;  Laterality: Left;  LEFT SIDED LATEARL INTERBODY FUSION, LUMBAR 2-3, LUMBAR 3-4, LUMBAR 4-5 WITH INSTRUMENTATION   . APPENDECTOMY    . BONE MARROW ASPIRATION  07/2012  . BONE MARROW BIOPSY  07/2012  . BREAST SURGERY    . CARDIAC CATHETERIZATION    . CARDIAC CATHETERIZATION N/A 01/20/2016   Procedure: Left Heart Cath and Coronary Angiography;  Surgeon: Burnell Blanks, MD;  Location: [place] [facility];  Service: Cardiovascular;  Laterality: N/A;  . COLONOSCOPY  11/29/2010   Procedure: COLONOSCOPY;  Surgeon: Rogene Houston, MD;  [facility];  Service: Endoscopy;  Laterality: N/A;  . COLONOSCOPY N/A 02/18/2014   Procedure: COLONOSCOPY;  Surgeon: Rogene Houston, MD;  [facility];  Service: Endoscopy;  Laterality: N/A;  1030  . CYSTOSCOPY N/A 12/09/2014   Procedure: CYSTOSCOPY;  Surgeon: Bjorn Loser, MD;  Location: [place] [facility];  Service: Urology;  Laterality: N/A;  . IR GENERIC HISTORICAL  01/11/2016   IR RADIOLOGY PERIPHERAL GUIDED IV START 01/11/2016 Saverio Danker, PA-C [facility]  .  IR GENERIC HISTORICAL  01/11/2016   IR US GUIDE VASC ACCESS RIGHT 01/11/2016 Saverio Danker, PA-C [facility]  . MASTECTOMY     left  . OVARIAN CYST SURGERY     x2  . SALPINGOOPHORECTOMY Bilateral 12/09/2014   Procedure: SALPINGO OOPHORECTOMY;  Surgeon: Servando Salina, MD;  Location: [facility];  Service: Gynecology;  Laterality: Bilateral;  . TUBAL LIGATION    . VAGINAL HYSTERECTOMY N/A 12/09/2014   Procedure: HYSTERECTOMY VAGINAL;  Surgeon: Servando Salina, MD;  Location: [place] [facility];  Service: Gynecology;   Laterality: N/A;    Current Outpatient Prescriptions  Medication Sig Dispense Refill  . aspirin EC 81 MG tablet Take 81 mg by mouth daily.    . Calcium Carb-Cholecalciferol (CALCIUM 600+D) 600-800 MG-UNIT TABS Take 1 tablet by mouth 2 (two) times daily.    . Cholecalciferol (VITAMIN D-3) 1000 units CAPS Take 1 capsule by mouth daily.    . clonazePAM (KLONOPIN) 0.5 MG tablet Take 1 tablet (0.5 mg total) by mouth at bedtime. 30 tablet 2  . CYANOCOBALAMIN IJ Inject 1,000 mcg as directed every 30 (thirty) days.    . furosemide (LASIX) 40 MG tablet Take 1 tablet (40 mg total) by mouth daily as needed for edema. 30 tablet 0  . gabapentin (NEURONTIN) 300 MG capsule Take 300 mg by mouth at bedtime.    . hydroxyurea (HYDREA) 500 MG capsule Take 500-1,000 mg by mouth as directed. Take 589m by mouth daily on Mondays and Tuesdays and 10037mby mouth every other day of the week.  May take with food to minimize GI side effects.    . ibandronate (BONIVA) 150 MG tablet Take 1 tablet by mouth every 30 (thirty) days.    . Marland Kitchenpratropium-albuterol (DUONEB) 0.5-2.5 (3) MG/3ML SOLN Take 3 mLs by nebulization every 6 (six) hours as needed (shortness of breath). 360 mL 0  . irbesartan-hydrochlorothiazide (AVALIDE) 150-12.5 MG tablet Take 1 tablet by mouth daily. Please hold if blood pressure less than 140/90. 30 tablet 0  . methocarbamol (ROBAXIN) 500 MG tablet Take 500 mg by mouth every 6 (six) hours as needed for muscle spasms.    . Marland Kitchenmeprazole (PRILOSEC OTC) 20 MG tablet Take 20 mg by mouth daily.    . Vladimir Fasterlycol-Propyl Glycol (SYSTANE) 0.4-0.3 % GEL ophthalmic gel Place 1 application into both eyes at bedtime.    . potassium chloride SA (K-DUR,KLOR-CON) 20 MEQ tablet TAKE ONE TABLET BY MOUTH 3 TIMES A DAY. 60 tablet 0  . rosuvastatin (CRESTOR) 40 MG tablet Take 40 mg by mouth daily.      . traMADol (ULTRAM) 50 MG tablet Take 50-100 mg by mouth 3 (three) times daily as needed (pain).      No current  facility-administered medications for this visit.     Allergies  Allergen Reactions  . Penicillins Hives and Rash    Has patient had a PCN reaction causing immediate rash, facial/tongue/throat swelling, SOB or lightheadedness with hypotension: Yes Has patient had a PCN reaction causing severe rash involving mucus membranes or skin necrosis: Yes Has patient had a PCN reaction that required hospitalization No Has patient had a PCN reaction occurring within the last 10 years: Yes If all of the above answers are "NO", then may proceed with Cephalosporin use.   . Tape Rash    Medipore, Coban, and paper tape CAN be tolerated    Social History   Social History  . Marital status: Divorced    Spouse name: N/A  . Number of  children: 2  . Years of education: N/A   Occupational History  . Not on file.   Social History Main Topics  . Smoking status: Current Every Day Smoker    Packs/day: 0.50    Years: 50.00    Types: Cigarettes  . Smokeless tobacco: Never Used  . Alcohol use No  . Drug use: No  . Sexual activity: Yes    Birth control/ protection: Post-menopausal   Other Topics Concern  . Not on file   Social History Narrative  . No narrative on file    Family History  Problem Relation Age of Onset  . Hypertension Mother   . Heart failure Mother   . Congestive Heart Failure Mother   . COPD Mother   . Pernicious anemia Mother   . Cancer Mother     lung  . Hypertension Father   . CAD Father   . Heart attack Father   . Hypertension Sister   . Cancer Other   . Celiac disease Other     Review of Systems:  As stated in the HPI and otherwise negative.   BP 100/70   Pulse 88   Ht '5\' 2"'  (1.575 m)   Wt 116 lb (52.6 kg)   BMI 21.22 kg/m   Physical Examination: General: Well developed, well nourished, NAD  HEENT: OP clear, mucus membranes moist  SKIN: warm, dry. No rashes. Neuro: No focal deficits  Musculoskeletal: Muscle strength 5/5 all ext  Psychiatric: Mood and  affect normal  Neck: No JVD, no carotid bruits, no thyromegaly, no lymphadenopathy.  Lungs:Clear bilaterally, no wheezes, rhonci, crackles Cardiovascular: Regular rate and rhythm. No murmurs, gallops or rubs. Abdomen:Soft. Bowel sounds present. Non-tender.  Extremities: No lower extremity edema. Pulses are 2 + in the bilateral DP/PT.  Echo 01/04/16: Left ventricle: The cavity size was normal. Wall thickness was   normal. Systolic function was mildly to moderately reduced. The   estimated ejection fraction was in the range of 40% to 45%.   Diffuse hypokinesis. Abnormal GLS, -14%. Doppler parameters are   consistent with abnormal left ventricular relaxation (grade 1   diastolic dysfunction). - Aortic valve: There was no stenosis. - Mitral valve: There was trivial regurgitation. - Right ventricle: The cavity size was normal. Systolic function   was normal. - Pulmonary arteries: No complete TR doppler jet so unable to   estimate PA systolic pressure. - Systemic veins: IVC not visualized.  Impressions:  - Normal LV size with EF 40%, diffuse hypokinesis. Normal RV size   and systolic function. No significant valvular abnormalities.   EKG:  EKG is not ordered today. The ekg ordered today demonstrates   Recent Labs: 12/26/2015: B Natriuretic Peptide 37.6 05/23/2016: ALT 14; BUN 18; Creatinine, Ser 0.87; Hemoglobin 9.9; Platelets 405; Potassium 3.5; Sodium 136   Lipid Panel No results found for: CHOL, TRIG, HDL, CHOLHDL, VLDL, LDLCALC, LDLDIRECT   Wt Readings from Last 3 Encounters:  06/21/16 116 lb (52.6 kg)  06/20/16 118 lb 6.4 oz (53.7 kg)  02/22/16 118 lb (53.5 kg)     Other studies Reviewed: Additional studies/ records that were reviewed today include: . Review of the above records demonstrates:   Assessment and Plan:   1. CAD without angina: She has no chest pain. Cardiac cath 2017 with mild CAD. She has not tolerated beta blocker therapy due to hypotension. Continue ASA  and statin.   2. HTN: BP controlled. No changes.   3. Non-ischemic cardiomyopathy/chronic systolic CHF: TWSF=68-12% by  echo September 2017, global hypokinesis. Continue Lasix 40 mg daily. Volume is ok today.   4. Tobacco abuse: Cessation advised. She does not wish to stop smoking.   5. HLD: on statin therapy with Crestor. Lipids followed in primary care.   6. Ascending thoracic aortic aneurym: Stable by CTA January 2018. Repeat CTA January 2019.   7. Dyspnea: Likely due to COPD. She has declined referral to Pulmonary  Current medicines are reviewed at length with the patient today.  The patient does not have concerns regarding medicines.  The following changes have been made:  no change  Labs/ tests ordered today include:  No orders of the defined types were placed in this encounter.    Disposition:   FU with me in 12  months   Signed, Lauree Chandler, MD 06/21/2016 2:51 PM    [facility] [address], [place], [place]  [address] Phone: [phone]; Fax: [phone]

## 2016-07-02 ENCOUNTER — Ambulatory Visit (HOSPITAL_COMMUNITY)
Admission: RE | Admit: 2016-07-02 | Discharge: 2016-07-02 | Disposition: A | Discharge status: Home / Self Care | Payer: Medicare Other | Source: Ambulatory Visit | Attending: Oncology | Admitting: Oncology

## 2016-07-02 DIAGNOSIS — Z1231 Encounter for screening mammogram for malignant neoplasm of breast: Secondary | ICD-10-CM

## 2016-07-02 DIAGNOSIS — N6489 Other specified disorders of breast: Secondary | ICD-10-CM | POA: Insufficient documentation

## 2016-07-03 ENCOUNTER — Other Ambulatory Visit (HOSPITAL_COMMUNITY): Payer: Self-pay | Admitting: Oncology

## 2016-07-03 DIAGNOSIS — R928 Other abnormal and inconclusive findings on diagnostic imaging of breast: Secondary | ICD-10-CM

## 2016-07-03 DIAGNOSIS — E876 Hypokalemia: Secondary | ICD-10-CM

## 2016-07-20 ENCOUNTER — Other Ambulatory Visit (HOSPITAL_COMMUNITY): Payer: Self-pay

## 2016-07-20 DIAGNOSIS — E876 Hypokalemia: Secondary | ICD-10-CM

## 2016-07-20 MED ORDER — POTASSIUM CHLORIDE CRYS ER 20 MEQ PO TBCR
EXTENDED_RELEASE_TABLET | ORAL | 0 refills | Status: DC
Start: 2016-07-20 — End: 2016-08-14

## 2016-07-24 ENCOUNTER — Ambulatory Visit (HOSPITAL_COMMUNITY)
Admission: RE | Admit: 2016-07-24 | Discharge: 2016-07-24 | Disposition: A | Discharge status: Home / Self Care | Payer: Medicare Other | Source: Ambulatory Visit | Attending: Oncology | Admitting: Oncology

## 2016-07-24 DIAGNOSIS — R928 Other abnormal and inconclusive findings on diagnostic imaging of breast: Secondary | ICD-10-CM | POA: Insufficient documentation

## 2016-07-24 DIAGNOSIS — Z9889 Other specified postprocedural states: Secondary | ICD-10-CM | POA: Diagnosis not present

## 2016-08-13 ENCOUNTER — Other Ambulatory Visit: Payer: Self-pay | Admitting: Orthopedic Surgery

## 2016-08-13 DIAGNOSIS — M5412 Radiculopathy, cervical region: Secondary | ICD-10-CM

## 2016-08-14 ENCOUNTER — Other Ambulatory Visit: Payer: Self-pay | Admitting: Adult Health

## 2016-08-14 ENCOUNTER — Other Ambulatory Visit (HOSPITAL_COMMUNITY): Payer: Self-pay | Admitting: Oncology

## 2016-08-14 ENCOUNTER — Ambulatory Visit
Admission: RE | Admit: 2016-08-14 | Discharge: 2016-08-14 | Disposition: A | Discharge status: Home / Self Care | Payer: Medicare Other | Source: Ambulatory Visit | Attending: Orthopedic Surgery | Admitting: Orthopedic Surgery

## 2016-08-14 DIAGNOSIS — C50912 Malignant neoplasm of unspecified site of left female breast: Secondary | ICD-10-CM

## 2016-08-14 DIAGNOSIS — E876 Hypokalemia: Secondary | ICD-10-CM

## 2016-08-14 DIAGNOSIS — M5412 Radiculopathy, cervical region: Secondary | ICD-10-CM

## 2016-08-29 ENCOUNTER — Other Ambulatory Visit: Payer: Self-pay | Admitting: Orthopedic Surgery

## 2016-08-29 DIAGNOSIS — M5412 Radiculopathy, cervical region: Secondary | ICD-10-CM

## 2016-08-30 ENCOUNTER — Other Ambulatory Visit: Payer: Self-pay | Admitting: Adult Health

## 2016-08-30 DIAGNOSIS — E876 Hypokalemia: Secondary | ICD-10-CM

## 2016-09-04 ENCOUNTER — Other Ambulatory Visit: Payer: Medicare Other

## 2016-09-26 ENCOUNTER — Other Ambulatory Visit: Payer: Self-pay | Admitting: Adult Health

## 2016-09-26 ENCOUNTER — Telehealth (HOSPITAL_COMMUNITY): Payer: Self-pay | Admitting: *Deleted

## 2016-09-26 ENCOUNTER — Other Ambulatory Visit (HOSPITAL_COMMUNITY): Payer: Self-pay | Admitting: Oncology

## 2016-09-26 DIAGNOSIS — E876 Hypokalemia: Secondary | ICD-10-CM

## 2016-09-27 ENCOUNTER — Other Ambulatory Visit (HOSPITAL_COMMUNITY): Payer: Self-pay

## 2016-09-27 ENCOUNTER — Other Ambulatory Visit (HOSPITAL_COMMUNITY): Payer: Medicare Other

## 2016-09-27 ENCOUNTER — Ambulatory Visit (HOSPITAL_COMMUNITY): Payer: Medicare Other

## 2016-09-27 DIAGNOSIS — E876 Hypokalemia: Secondary | ICD-10-CM

## 2016-09-27 NOTE — Telephone Encounter (Signed)
   Patients wants a refill on potassium but labs last checked at [facility] on 08/20/16 show K+ of 4.7. Do you want me to refill?

## 2016-09-27 NOTE — Telephone Encounter (Signed)
Okay, it looks like gretchen sent in refill.  Thanks, MB

## 2016-09-27 NOTE — Telephone Encounter (Signed)
Yes.  That will be fine.  TK

## 2016-10-01 ENCOUNTER — Other Ambulatory Visit: Payer: Self-pay | Admitting: Orthopedic Surgery

## 2016-10-02 ENCOUNTER — Encounter (HOSPITAL_COMMUNITY): Payer: Self-pay | Admitting: *Deleted

## 2016-10-02 MED ORDER — VANCOMYCIN HCL IN DEXTROSE 1-5 GM/200ML-% IV SOLN
1000.0000 mg | INTRAVENOUS | Status: AC
  Administered 2016-10-03: 1000 mg via INTRAVENOUS
  Filled 2016-10-02: qty 200

## 2016-10-02 NOTE — Progress Notes (Signed)
Anesthesia Chart Review: SAME DAY WORK-UP.  Patient is a 66 year old female scheduled for C4-5, C5-6, C6-7, C7-T1 ACDF on 10/03/16 by Dr. Lynann Bologna.  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  - PCP is Angelina Ok, NP at [facility].  - Hem-Onc is Dr. Ancil Linsey. - Cardiologist is Dr. Lauree Chandler, last 06/21/16. He felt ascending TAA was stable by CTA 04/2016 with plans to repeat 04/2017. She still had some dyspnea, felt likely due to COPD. She had not chest pain and her volume status was felt stable. Only mild CAD by 2017 cath. No further cardiac testing ordered. He did offer referral to pulmonology, but patient declined. Chest CT done 04/2016. Smoker.  Meds include aspirin 81 mg, clonazepam, Lasix, gabapentin, hydroxyurea, Boniva, Duoneb, Avalide, Robaxin, Prilosec OTC, KCl, Crestor, tramadol.  EKG 12/26/15: NSR.  Cardiac cath 01/20/16:  Ost RCA to Prox RCA lesion, 30 %stenosed.  Prox Cx to Mid Cx lesion, 30 %stenosed.  Ost LM to LM lesion, 10 %stenosed.  Prox LAD to Mid LAD lesion, 10 %stenosed.  LV end diastolic pressure is normal. 1. Mild non-obstructive CAD 2. Normal LV filling pressures.  Recommendations: No further ischemic workup. Medical management of CAD. She is on an ARB. Consider addition of beta blocker. She will need further investigation of cause of her dyspnea with pulmonary workup.   Echo 01/04/16: Study Conclusions - Left ventricle: The cavity size was normal. Wall thickness was   normal. Systolic function was  mildly to moderately reduced. The   estimated ejection fraction was in the range of 40% to 45%.   Diffuse hypokinesis. Abnormal GLS, -14%. Doppler parameters are   consistent with abnormal left ventricular relaxation (grade 1   diastolic dysfunction). - Aortic valve: There was no stenosis. - Mitral valve: There was trivial regurgitation. - Right ventricle: The cavity size was normal. Systolic function   was normal. - Pulmonary arteries: No complete TR doppler jet so unable to   estimate PA systolic pressure. - Systemic veins: IVC not visualized. Impressions: - Normal LV size with EF 40%, diffuse hypokinesis. Normal RV size   and systolic function. No significant valvular abnormalities. (LHC was ultimately recommended due to LV dysfunction. Done on 01/20/16 showing only mild CAD.)  Nuclear stress test 11/09/15: Study Highlights: Myocardial perfusion is normal. No ischemia identified. No ST segment deviation noted during stress. PACs noted. This is a low risk study. Overall left ventricular systolic function was normal. LV cavity size is normal. Nuclear stress EF: 63%. No wall motion abnormalities.  Chest CT 05/10/16: IMPRESSION: - There are 3 small hypodense lesions of the right liver lobe, as above, concerning for metastatic disease. Further evaluation with PET-CT or contrast-enhanced MRI may be considered. - Small lung nodules are again demonstrated, measuring smaller than the comparison chest CT, demonstrating benign behavior. - Paraseptal and centrilobular emphysema. - Aortic atherosclerosis and coronary artery disease. No evidence of significant stenosis at the origin of any of the mesenteric vessels, renal vessels, or the iliac vasculature. No abdominal aortic aneurysm. Greatest diameter of the ascending aorta measures 4.1 cm. - Calcifications of the pancreas compatible with chronic pancreatitis. - Diverticular disease without evidence of acute diverticulitis. -  Right adrenal  adenoma again noted. - Nodule of the medial limb left adrenal gland, indeterminate, measuring 12 mm in greatest diameter. - Upper abdominal lymph nodes in the periaortic/preaortic nodal station, unchanged.  PET scan 05/25/16 (to evaluate liver lesion in the setting of breast cancer history): IMPRESSION: 1. No findings of active malignancy in the neck, chest, abdomen, or pelvis. The 3 liver lesions are not hypermetabolic. Moreover, I suspect that they may be subtly present on the prior CT chest from 10/23/2013, further reassuring that these are likely benign. 2. The ascending aorta measures 4.2 cm in diameter. Recommend annual imaging followup by CTA or MRA. This recommendation follows 2010 ACCF/AHA/AATS/ACR/ASA/SCA/SCAI/SIR/STS/SVM Guidelines for the Diagnosis and Management of Patients with Thoracic Aortic Disease. Circulation. 2010; 121: L953-U023 3. Other imaging findings of potential clinical significance: Coronary, aortic arch, and branch vessel atherosclerotic vascular disease. Prominent emphysema. Several subpleural nodules along the major fissures have no significant accentuated metabolic activity and are probably benign. Right adrenal adenoma. 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 Postoperative findings in the lumbar spine.  She is a same day work-up, so labs to be drawn on arrival.Anesthesiologist to evaluate as well to ensure no acute cardiopulmonary issues prior to proceeding.  [facility] Short Stay Center/Anesthesiology Phone [phone] 10/02/2016 4:36 PM

## 2016-10-02 NOTE — H&P (Signed)
PREOPERATIVE H&P  Chief Complaint: Right arm pain  HPI: Ashley [name] is a 66 y.o. female who presents with ongoing pain in the right arm  MRI reveals NF stenosis spanning C4-T1  Patient has failed multiple forms of conservative care and continues to have pain (see office notes for additional details regarding the patient's full course of treatment)  Past Medical History:  Diagnosis Date  . Arthritis   . Ascending aortic aneurysm ([place])   . Cancer [facility]) 1995   breast, left, mastectomy/chemo  . Chest pain    Possibly cardiac. No evidence of ischemia/injury based upon normal troponin I. Chest discomfort could be tachycardia induced supply demand mismatch.   . Colon adenomas   . Coronary artery disease   . Essential hypertension, benign   . GERD (gastroesophageal reflux disease)   . Hyperlipidemia   . Hypertension   . Palpitations   . Pernicious anemia 03/06/2016  . Pure hypercholesterolemia   . Thrombocytosis ([place])    Idiopathic   Past Surgical History:  Procedure Laterality Date  . ANTERIOR AND POSTERIOR REPAIR N/A 12/09/2014   Procedure: ANTERIOR (CYSTOCELE) AND POSTERIOR REPAIR (RECTOCELE);  Surgeon: Bjorn Loser, MD;  Location: [facility];  Service: Urology;  Laterality: N/A;  . ANTERIOR LAT LUMBAR FUSION Left 11/16/2015   Procedure: LEFT SIDED LATERAL INTERBODY FUSION, LUMBAR 2-3, LUMBAR 3-4, LUMBAR 4-5 WITH INSTRUMENTATION;  Surgeon: Phylliss Bob, MD;  Location: [place];  Service: Orthopedics;  Laterality: Left;  LEFT SIDED LATEARL INTERBODY FUSION, LUMBAR 2-3, LUMBAR 3-4, LUMBAR 4-5 WITH INSTRUMENTATION   . APPENDECTOMY    . BONE MARROW ASPIRATION  07/2012  . BONE MARROW BIOPSY  07/2012  . BREAST SURGERY    . CARDIAC CATHETERIZATION    . CARDIAC CATHETERIZATION N/A 01/20/2016   Procedure: Left Heart Cath and Coronary Angiography;  Surgeon: Burnell Blanks, MD;  Location: [place] [facility];  Service: Cardiovascular;  Laterality: N/A;  . COLONOSCOPY  11/29/2010   Procedure: COLONOSCOPY;  Surgeon: Rogene Houston, MD;  [facility];  Service: Endoscopy;  Laterality: N/A;  . COLONOSCOPY N/A 02/18/2014   Procedure: COLONOSCOPY;  Surgeon: Rogene Houston, MD;  [facility];  Service: Endoscopy;  Laterality: N/A;  1030  . CYSTOSCOPY N/A 12/09/2014   Procedure: CYSTOSCOPY;  Surgeon: Bjorn Loser, MD;  Location: [facility];  Service: Urology;  Laterality: N/A;  . IR GENERIC HISTORICAL  01/11/2016   IR RADIOLOGY PERIPHERAL GUIDED IV START 01/11/2016 Saverio Danker, PA-C [facility]  . IR GENERIC HISTORICAL  01/11/2016   IR US GUIDE VASC ACCESS RIGHT 01/11/2016 Saverio Danker, PA-C [facility]  . MASTECTOMY     left  . OVARIAN CYST SURGERY     x2  . SALPINGOOPHORECTOMY Bilateral 12/09/2014   Procedure: SALPINGO OOPHORECTOMY;  Surgeon: Servando Salina, MD;  Location: [facility];  Service: Gynecology;  Laterality: Bilateral;  . TUBAL LIGATION    . VAGINAL HYSTERECTOMY N/A 12/09/2014   Procedure: HYSTERECTOMY VAGINAL;  Surgeon: Servando Salina, MD;  Location: [facility];  Service: Gynecology;  Laterality: N/A;   Social History   Social History  . Marital status: Divorced    Spouse name: N/A  . Number of children: 2  . Years of education: N/A   Social History Main Topics  . Smoking status: Current Every Day Smoker    Packs/day: 0.50    Years: 50.00    Types: Cigarettes  . Smokeless tobacco: Never Used  . Alcohol use No  .  Drug use: No  . Sexual activity: Yes    Birth control/ protection: Post-menopausal   Other Topics Concern  . Not on file   Social History Narrative  . No narrative on file   Family History  Problem Relation Age of Onset  . Hypertension Mother   . Heart failure Mother   . Congestive Heart Failure Mother   . COPD Mother   . Pernicious anemia Mother   . Cancer Mother        lung  . Hypertension Father   . CAD Father   . Heart attack Father   . Hypertension Sister   . Cancer Other   . Celiac disease  Other    Allergies  Allergen Reactions  . Penicillins Hives and Rash    Has patient had a PCN reaction causing immediate rash, facial/tongue/throat swelling, SOB or lightheadedness with hypotension: Yes Has patient had a PCN reaction causing severe rash involving mucus membranes or skin necrosis: Yes Has patient had a PCN reaction that required hospitalization No Has patient had a PCN reaction occurring within the last 10 years: Yes If all of the above answers are "NO", then may proceed with Cephalosporin use.   . Tape Rash    Medipore, Coban, and paper tape CAN be tolerated   Prior to Admission medications   Medication Sig Start Date End Date Taking? Authorizing Provider  aspirin EC 81 MG tablet Take 81 mg by mouth daily.   Yes [provider]  Calcium Carb-Cholecalciferol (CALCIUM 600+D) 600-800 MG-UNIT TABS Take 1 tablet by mouth 2 (two) times daily.   Yes [provider]  Cholecalciferol (VITAMIN D-3) 1000 units CAPS Take 1 capsule by mouth daily.   Yes [provider]  clonazePAM (KLONOPIN) 0.5 MG tablet TAKE (1) TABLET BY MOUTH AT BEDTIME. 08/14/16  Yes Kefalas, Manon Hilding, PA-C  cyanocobalamin (,VITAMIN B-12,) 1000 MCG/ML injection INJECT 1 ML INTO THE MUSCLE ONCE MONTHLY AS DIRECTED. 09/26/16  Yes Kefalas, Manon Hilding, PA-C  furosemide (LASIX) 40 MG tablet Take 1 tablet (40 mg total) by mouth daily as needed for edema. 11/21/15  Yes Arrien, Jimmy Picket, MD  gabapentin (NEURONTIN) 300 MG capsule Take 300 mg by mouth at bedtime.   Yes [provider]  hydroxyurea (HYDREA) 500 MG capsule Take 500-1,000 mg by mouth as directed. Take 550m by mouth daily on Mondays and Tuesdays and 10034mby mouth all other days of the week.  May take with food to minimize GI side effects.   Yes [provider]  ibandronate (BONIVA) 150 MG tablet Take 1 tablet by mouth every 30 (thirty) days. 07/14/12  Yes [provider]  ipratropium-albuterol (DUONEB)  0.5-2.5 (3) MG/3ML SOLN Take 3 mLs by nebulization every 6 (six) hours as needed (shortness of breath). 11/21/15  Yes Arrien, MaJimmy PicketMD  irbesartan-hydrochlorothiazide (AVALIDE) 150-12.5 MG tablet Take 1 tablet by mouth daily. Please hold if blood pressure less than 140/90. 11/21/15  Yes Arrien, MaJimmy PicketMD  methocarbamol (ROBAXIN) 500 MG tablet Take 500 mg by mouth every 6 (six) hours as needed for muscle spasms.   Yes [provider]  omeprazole (PRILOSEC OTC) 20 MG tablet Take 20 mg by mouth daily.   Yes [provider]  potassium chloride SA (K-DUR,KLOR-CON) 20 MEQ tablet TAKE ONE TABLET BY MOUTH 3 TIMES A DAY. 09/27/16  Yes DaHolley BoucheNP  rosuvastatin (CRESTOR) 40 MG tablet Take 40 mg by mouth daily.     Yes [provider]  traMADol (ULTRAM) 50 MG tablet Take 50-100 mg by mouth 3 (three) times daily as needed (pain).    Yes [provider]     All other systems have been reviewed and were otherwise negative with the exception of those mentioned in the HPI and as above.  Physical Exam: There were no vitals filed for this visit.  General: Alert, no acute distress Cardiovascular: No pedal edema Respiratory: No cyanosis, no use of accessory musculature Skin: No lesions in the area of chief complaint Neurologic: Sensation intact distally Psychiatric: Patient is competent for consent with normal mood and affect Lymphatic: No axillary or cervical lymphadenopathy  MUSCULOSKELETAL: + spurling's sign on the right  Assessment/Plan: Right arm pain Plan for Procedure(s): ANTERIOR CERVICAL DECOMPRESSION FUSION, CERVICAL 4-5, CERVICAL 5-6, CERVICAL 6-7, CERVICAL 7 TO THORACIC 1 WITH INSTRUMENTATION AND ALLOGRAFT   Sinclair Ship, MD 10/02/2016 3:51 PM

## 2016-10-03 ENCOUNTER — Encounter (HOSPITAL_COMMUNITY): Payer: Self-pay | Admitting: *Deleted

## 2016-10-03 ENCOUNTER — Inpatient Hospital Stay (HOSPITAL_COMMUNITY): Payer: Medicare Other

## 2016-10-03 ENCOUNTER — Inpatient Hospital Stay (HOSPITAL_COMMUNITY)
Admission: RE | Admit: 2016-10-03 | Discharge: 2016-10-04 | DRG: 030 | Disposition: A | Discharge status: Home / Self Care | Payer: Medicare Other | Source: Ambulatory Visit | Attending: Orthopedic Surgery | Admitting: Orthopedic Surgery

## 2016-10-03 ENCOUNTER — Inpatient Hospital Stay (HOSPITAL_COMMUNITY)
Admission: RE | Disposition: A | Discharge status: Home / Self Care | Payer: Self-pay | Source: Ambulatory Visit | Attending: Orthopedic Surgery

## 2016-10-03 ENCOUNTER — Ambulatory Visit (HOSPITAL_COMMUNITY): Payer: Medicare Other | Admitting: Emergency Medicine

## 2016-10-03 DIAGNOSIS — M1990 Unspecified osteoarthritis, unspecified site: Secondary | ICD-10-CM | POA: Diagnosis present

## 2016-10-03 DIAGNOSIS — Z7982 Long term (current) use of aspirin: Secondary | ICD-10-CM

## 2016-10-03 DIAGNOSIS — M79601 Pain in right arm: Secondary | ICD-10-CM | POA: Diagnosis present

## 2016-10-03 DIAGNOSIS — Z01818 Encounter for other preprocedural examination: Secondary | ICD-10-CM

## 2016-10-03 DIAGNOSIS — I10 Essential (primary) hypertension: Secondary | ICD-10-CM | POA: Diagnosis present

## 2016-10-03 DIAGNOSIS — I712 Thoracic aortic aneurysm, without rupture: Secondary | ICD-10-CM | POA: Diagnosis present

## 2016-10-03 DIAGNOSIS — Z880 Allergy status to penicillin: Secondary | ICD-10-CM | POA: Diagnosis not present

## 2016-10-03 DIAGNOSIS — F17210 Nicotine dependence, cigarettes, uncomplicated: Secondary | ICD-10-CM | POA: Diagnosis present

## 2016-10-03 DIAGNOSIS — M5412 Radiculopathy, cervical region: Principal | ICD-10-CM | POA: Diagnosis present

## 2016-10-03 DIAGNOSIS — Z419 Encounter for procedure for purposes other than remedying health state, unspecified: Secondary | ICD-10-CM

## 2016-10-03 DIAGNOSIS — D473 Essential (hemorrhagic) thrombocythemia: Secondary | ICD-10-CM

## 2016-10-03 DIAGNOSIS — Z8249 Family history of ischemic heart disease and other diseases of the circulatory system: Secondary | ICD-10-CM

## 2016-10-03 DIAGNOSIS — M4803 Spinal stenosis, cervicothoracic region: Secondary | ICD-10-CM | POA: Diagnosis present

## 2016-10-03 DIAGNOSIS — Z91048 Other nonmedicinal substance allergy status: Secondary | ICD-10-CM | POA: Diagnosis not present

## 2016-10-03 DIAGNOSIS — Z79899 Other long term (current) drug therapy: Secondary | ICD-10-CM

## 2016-10-03 DIAGNOSIS — I2510 Atherosclerotic heart disease of native coronary artery without angina pectoris: Secondary | ICD-10-CM | POA: Diagnosis present

## 2016-10-03 DIAGNOSIS — J9600 Acute respiratory failure, unspecified whether with hypoxia or hypercapnia: Secondary | ICD-10-CM

## 2016-10-03 DIAGNOSIS — J449 Chronic obstructive pulmonary disease, unspecified: Secondary | ICD-10-CM

## 2016-10-03 DIAGNOSIS — Z853 Personal history of malignant neoplasm of breast: Secondary | ICD-10-CM | POA: Diagnosis not present

## 2016-10-03 DIAGNOSIS — M5410 Radiculopathy, site unspecified: Secondary | ICD-10-CM | POA: Diagnosis present

## 2016-10-03 HISTORY — PX: ANTERIOR CERVICAL DECOMPRESSION/DISCECTOMY FUSION 4 LEVELS: SHX5556

## 2016-10-03 HISTORY — DX: Heart failure, unspecified: I50.9

## 2016-10-03 HISTORY — DX: Dyspnea, unspecified: R06.00

## 2016-10-03 LAB — COMPREHENSIVE METABOLIC PANEL
ALK PHOS: 63 U/L (ref 38–126)
ALT: 15 U/L (ref 14–54)
AST: 28 U/L (ref 15–41)
Albumin: 4 g/dL (ref 3.5–5.0)
Anion gap: 9 (ref 5–15)
BUN: 22 mg/dL — AB (ref 6–20)
CALCIUM: 9.6 mg/dL (ref 8.9–10.3)
CHLORIDE: 104 mmol/L (ref 101–111)
CO2: 24 mmol/L (ref 22–32)
CREATININE: 1.18 mg/dL — AB (ref 0.44–1.00)
GFR, EST AFRICAN AMERICAN: 54 mL/min — AB (ref >60)
GFR, EST NON AFRICAN AMERICAN: 46 mL/min — AB (ref >60)
Glucose, Bld: 93 mg/dL (ref 65–99)
Potassium: 4.1 mmol/L (ref 3.5–5.1)
Sodium: 137 mmol/L (ref 135–145)
Total Bilirubin: 0.5 mg/dL (ref 0.3–1.2)
Total Protein: 7.2 g/dL (ref 6.5–8.1)

## 2016-10-03 LAB — CBC WITH DIFFERENTIAL/PLATELET
BASOS ABS: 0 10*3/uL (ref 0.0–0.1)
Basophils Relative: 0 %
Eosinophils Absolute: 0.2 10*3/uL (ref 0.0–0.7)
Eosinophils Relative: 3 %
HEMATOCRIT: 28.8 % — AB (ref 36.0–46.0)
HEMOGLOBIN: 9.7 g/dL — AB (ref 12.0–15.0)
LYMPHS ABS: 1.4 10*3/uL (ref 0.7–4.0)
LYMPHS PCT: 28 %
MCH: 38 pg — AB (ref 26.0–34.0)
MCHC: 33.7 g/dL (ref 30.0–36.0)
MCV: 112.9 fL — ABNORMAL HIGH (ref 78.0–100.0)
MONOS PCT: 5 %
Monocytes Absolute: 0.3 10*3/uL (ref 0.1–1.0)
NEUTROS PCT: 64 %
Neutro Abs: 3.2 10*3/uL (ref 1.7–7.7)
PLATELETS: 296 10*3/uL (ref 150–400)
RBC: 2.55 MIL/uL — AB (ref 3.87–5.11)
RDW: 14.2 % (ref 11.5–15.5)
WBC: 5.1 10*3/uL (ref 4.0–10.5)

## 2016-10-03 LAB — PROTIME-INR
INR: 1
PROTHROMBIN TIME: 13.2 s (ref 11.4–15.2)

## 2016-10-03 LAB — APTT: APTT: 30 s (ref 24–36)

## 2016-10-03 LAB — PREPARE RBC (CROSSMATCH)

## 2016-10-03 SURGERY — ANTERIOR CERVICAL DECOMPRESSION/DISCECTOMY FUSION 4 LEVELS
Anesthesia: General | Laterality: Right

## 2016-10-03 MED ORDER — ACETAMINOPHEN 650 MG RE SUPP: 650.0000 mg | RECTAL | Status: DC | PRN

## 2016-10-03 MED ORDER — BUPIVACAINE LIPOSOME 1.3 % IJ SUSP
20.0000 mL | Freq: Once | INTRAMUSCULAR | Status: DC
  Filled 2016-10-03: qty 20

## 2016-10-03 MED ORDER — LACTATED RINGERS IV SOLN
INTRAVENOUS | Status: DC | PRN
  Administered 2016-10-03: 08:00:00 via INTRAVENOUS

## 2016-10-03 MED ORDER — HEMOSTATIC AGENTS (NO CHARGE) OPTIME
TOPICAL | Status: DC | PRN
  Administered 2016-10-03: 1 via TOPICAL

## 2016-10-03 MED ORDER — DOCUSATE SODIUM 100 MG PO CAPS
100.0000 mg | ORAL_CAPSULE | Freq: Two times a day (BID) | ORAL | Status: DC
  Administered 2016-10-03 (×2): 100 mg via ORAL
  Filled 2016-10-03 (×2): qty 1

## 2016-10-03 MED ORDER — OXYCODONE-ACETAMINOPHEN 5-325 MG PO TABS
1.0000 | ORAL_TABLET | ORAL | Status: DC | PRN
  Administered 2016-10-03 – 2016-10-04 (×3): 1 via ORAL
  Filled 2016-10-03 (×2): qty 1

## 2016-10-03 MED ORDER — BUPIVACAINE-EPINEPHRINE (PF) 0.25% -1:200000 IJ SOLN
INTRAMUSCULAR | Status: AC
  Filled 2016-10-03: qty 30

## 2016-10-03 MED ORDER — GABAPENTIN 300 MG PO CAPS
300.0000 mg | ORAL_CAPSULE | Freq: Every day | ORAL | Status: DC
  Administered 2016-10-03: 300 mg via ORAL
  Filled 2016-10-03: qty 1

## 2016-10-03 MED ORDER — MIDAZOLAM HCL 2 MG/2ML IJ SOLN
INTRAMUSCULAR | Status: AC
  Filled 2016-10-03: qty 2

## 2016-10-03 MED ORDER — SUGAMMADEX SODIUM 200 MG/2ML IV SOLN
INTRAVENOUS | Status: AC
Start: 2016-10-03 — End: 2016-10-03
  Filled 2016-10-03: qty 2

## 2016-10-03 MED ORDER — DEXAMETHASONE SODIUM PHOSPHATE 10 MG/ML IJ SOLN
INTRAMUSCULAR | Status: AC
  Filled 2016-10-03: qty 1

## 2016-10-03 MED ORDER — HYDROXYUREA 500 MG PO CAPS
1000.0000 mg | ORAL_CAPSULE | ORAL | Status: DC
  Filled 2016-10-03 (×2): qty 2

## 2016-10-03 MED ORDER — PANTOPRAZOLE SODIUM 40 MG PO TBEC
40.0000 mg | DELAYED_RELEASE_TABLET | Freq: Every day | ORAL | Status: DC
  Administered 2016-10-03: 40 mg via ORAL
  Filled 2016-10-03: qty 1

## 2016-10-03 MED ORDER — PROPOFOL 10 MG/ML IV BOLUS
INTRAVENOUS | Status: AC
  Filled 2016-10-03: qty 20

## 2016-10-03 MED ORDER — MENTHOL 3 MG MT LOZG
1.0000 | LOZENGE | OROMUCOSAL | Status: DC | PRN
  Filled 2016-10-03: qty 9

## 2016-10-03 MED ORDER — FUROSEMIDE 40 MG PO TABS: 40.0000 mg | ORAL_TABLET | Freq: Every day | ORAL | Status: DC | PRN

## 2016-10-03 MED ORDER — PROMETHAZINE HCL 25 MG/ML IJ SOLN: 6.2500 mg | INTRAMUSCULAR | Status: DC | PRN

## 2016-10-03 MED ORDER — DIAZEPAM 5 MG PO TABS
ORAL_TABLET | ORAL | Status: AC
  Administered 2016-10-03: 5 mg via ORAL
  Filled 2016-10-03: qty 1

## 2016-10-03 MED ORDER — POVIDONE-IODINE 7.5 % EX SOLN
Freq: Once | CUTANEOUS | Status: DC
  Filled 2016-10-03: qty 118

## 2016-10-03 MED ORDER — ONDANSETRON HCL 4 MG PO TABS: 4.0000 mg | ORAL_TABLET | Freq: Four times a day (QID) | ORAL | Status: DC | PRN

## 2016-10-03 MED ORDER — HYDROXYUREA 500 MG PO CAPS: 500.0000 mg | ORAL_CAPSULE | ORAL | Status: DC

## 2016-10-03 MED ORDER — MORPHINE SULFATE (PF) 4 MG/ML IV SOLN
1.0000 mg | INTRAVENOUS | Status: DC | PRN
Start: 2016-10-03 — End: 2016-10-04
  Administered 2016-10-03: 2 mg via INTRAVENOUS
  Filled 2016-10-03: qty 1

## 2016-10-03 MED ORDER — PANTOPRAZOLE SODIUM 40 MG IV SOLR: 40.0000 mg | Freq: Every day | INTRAVENOUS | Status: DC

## 2016-10-03 MED ORDER — DIAZEPAM 5 MG PO TABS
5.0000 mg | ORAL_TABLET | Freq: Four times a day (QID) | ORAL | Status: DC | PRN
  Administered 2016-10-03 – 2016-10-04 (×3): 5 mg via ORAL
  Filled 2016-10-03 (×2): qty 1

## 2016-10-03 MED ORDER — IRBESARTAN-HYDROCHLOROTHIAZIDE 150-12.5 MG PO TABS: 1.0000 | ORAL_TABLET | Freq: Every day | ORAL | Status: DC

## 2016-10-03 MED ORDER — EPHEDRINE 5 MG/ML INJ
INTRAVENOUS | Status: AC
  Filled 2016-10-03: qty 10

## 2016-10-03 MED ORDER — CALCIUM CARB-CHOLECALCIFEROL 600-800 MG-UNIT PO TABS: 1.0000 | ORAL_TABLET | Freq: Two times a day (BID) | ORAL | Status: DC

## 2016-10-03 MED ORDER — IBANDRONATE SODIUM 150 MG PO TABS: 150.0000 mg | ORAL_TABLET | ORAL | Status: DC

## 2016-10-03 MED ORDER — POTASSIUM CHLORIDE CRYS ER 20 MEQ PO TBCR
20.0000 meq | EXTENDED_RELEASE_TABLET | Freq: Three times a day (TID) | ORAL | Status: DC
  Administered 2016-10-03: 20 meq via ORAL
  Filled 2016-10-03: qty 1

## 2016-10-03 MED ORDER — GLYCOPYRROLATE 0.2 MG/ML IJ SOLN
INTRAMUSCULAR | Status: DC | PRN
  Administered 2016-10-03: 0.4 mg via INTRAVENOUS

## 2016-10-03 MED ORDER — FENTANYL CITRATE (PF) 250 MCG/5ML IJ SOLN
INTRAMUSCULAR | Status: AC
  Filled 2016-10-03: qty 10

## 2016-10-03 MED ORDER — CLONAZEPAM 0.5 MG PO TABS
0.5000 mg | ORAL_TABLET | Freq: Two times a day (BID) | ORAL | Status: DC | PRN
  Filled 2016-10-03: qty 1

## 2016-10-03 MED ORDER — SODIUM CHLORIDE 0.9% FLUSH
3.0000 mL | Freq: Two times a day (BID) | INTRAVENOUS | Status: DC
  Administered 2016-10-03 (×2): 3 mL via INTRAVENOUS

## 2016-10-03 MED ORDER — 0.9 % SODIUM CHLORIDE (POUR BTL) OPTIME
TOPICAL | Status: DC | PRN
  Administered 2016-10-03 (×2): 1000 mL

## 2016-10-03 MED ORDER — LIDOCAINE HCL 4 % MT SOLN
OROMUCOSAL | Status: DC | PRN
  Administered 2016-10-03: 4 mL via TOPICAL

## 2016-10-03 MED ORDER — ARTIFICIAL TEARS OPHTHALMIC OINT
TOPICAL_OINTMENT | OPHTHALMIC | Status: AC
  Filled 2016-10-03: qty 3.5

## 2016-10-03 MED ORDER — FENTANYL CITRATE (PF) 100 MCG/2ML IJ SOLN
INTRAMUSCULAR | Status: DC | PRN
  Administered 2016-10-03 (×2): 100 ug via INTRAVENOUS
  Administered 2016-10-03 (×2): 50 ug via INTRAVENOUS
  Administered 2016-10-03: 100 ug via INTRAVENOUS
  Administered 2016-10-03 (×2): 50 ug via INTRAVENOUS

## 2016-10-03 MED ORDER — FLEET ENEMA 7-19 GM/118ML RE ENEM: 1.0000 | ENEMA | Freq: Once | RECTAL | Status: DC | PRN

## 2016-10-03 MED ORDER — BISACODYL 5 MG PO TBEC: 5.0000 mg | DELAYED_RELEASE_TABLET | Freq: Every day | ORAL | Status: DC | PRN

## 2016-10-03 MED ORDER — ZOLPIDEM TARTRATE 5 MG PO TABS: 5.0000 mg | ORAL_TABLET | Freq: Every evening | ORAL | Status: DC | PRN

## 2016-10-03 MED ORDER — SODIUM CHLORIDE 0.9% FLUSH: 3.0000 mL | INTRAVENOUS | Status: DC | PRN

## 2016-10-03 MED ORDER — PHENOL 1.4 % MT LIQD
1.0000 | OROMUCOSAL | Status: DC | PRN
  Filled 2016-10-03: qty 177

## 2016-10-03 MED ORDER — VANCOMYCIN HCL IN DEXTROSE 1-5 GM/200ML-% IV SOLN
1000.0000 mg | Freq: Once | INTRAVENOUS | Status: AC
  Administered 2016-10-03: 1000 mg via INTRAVENOUS
  Filled 2016-10-03: qty 200

## 2016-10-03 MED ORDER — SUGAMMADEX SODIUM 200 MG/2ML IV SOLN
INTRAVENOUS | Status: DC | PRN
  Administered 2016-10-03: 200 mg via INTRAVENOUS

## 2016-10-03 MED ORDER — MORPHINE SULFATE (PF) 2 MG/ML IV SOLN: 1.0000 mg | INTRAVENOUS | Status: DC | PRN

## 2016-10-03 MED ORDER — DIPHENHYDRAMINE HCL 50 MG/ML IJ SOLN
INTRAMUSCULAR | Status: AC
  Filled 2016-10-03: qty 1

## 2016-10-03 MED ORDER — DEXTROSE 5 % IV SOLN
INTRAVENOUS | Status: DC | PRN
  Administered 2016-10-03: 45 ug/min via INTRAVENOUS

## 2016-10-03 MED ORDER — BUPIVACAINE-EPINEPHRINE 0.25% -1:200000 IJ SOLN
INTRAMUSCULAR | Status: DC | PRN
  Administered 2016-10-03: 4 mL

## 2016-10-03 MED ORDER — THROMBIN 20000 UNITS EX SOLR
CUTANEOUS | Status: AC
  Filled 2016-10-03: qty 20000

## 2016-10-03 MED ORDER — VITAMIN D 1000 UNITS PO TABS
1000.0000 [IU] | ORAL_TABLET | Freq: Every day | ORAL | Status: DC
  Administered 2016-10-03: 1000 [IU] via ORAL
  Filled 2016-10-03: qty 1

## 2016-10-03 MED ORDER — DEXTROSE 5 % IV SOLN
INTRAVENOUS | Status: DC | PRN
  Administered 2016-10-03: 09:00:00 via INTRAVENOUS

## 2016-10-03 MED ORDER — LIDOCAINE HCL (CARDIAC) 20 MG/ML IV SOLN
INTRAVENOUS | Status: DC | PRN
  Administered 2016-10-03: 80 mg via INTRAVENOUS

## 2016-10-03 MED ORDER — THROMBIN 20000 UNITS EX KIT
PACK | CUTANEOUS | Status: DC | PRN
  Administered 2016-10-03: 20000 [IU] via TOPICAL

## 2016-10-03 MED ORDER — PROPOFOL 10 MG/ML IV BOLUS
INTRAVENOUS | Status: DC | PRN
  Administered 2016-10-03: 130 mg via INTRAVENOUS

## 2016-10-03 MED ORDER — ALUM & MAG HYDROXIDE-SIMETH 200-200-20 MG/5ML PO SUSP: 30.0000 mL | Freq: Four times a day (QID) | ORAL | Status: DC | PRN

## 2016-10-03 MED ORDER — IRBESARTAN 150 MG PO TABS
150.0000 mg | ORAL_TABLET | Freq: Every day | ORAL | Status: DC
  Filled 2016-10-03 (×2): qty 1

## 2016-10-03 MED ORDER — ONDANSETRON HCL 4 MG/2ML IJ SOLN
INTRAMUSCULAR | Status: AC
  Filled 2016-10-03: qty 2

## 2016-10-03 MED ORDER — IPRATROPIUM-ALBUTEROL 0.5-2.5 (3) MG/3ML IN SOLN: 3.0000 mL | Freq: Four times a day (QID) | RESPIRATORY_TRACT | Status: DC | PRN

## 2016-10-03 MED ORDER — HYDROMORPHONE HCL 1 MG/ML IJ SOLN
INTRAMUSCULAR | Status: AC
  Administered 2016-10-03: 0.5 mg via INTRAVENOUS
  Filled 2016-10-03: qty 0.5

## 2016-10-03 MED ORDER — CALCIUM CARBONATE-VITAMIN D 500-200 MG-UNIT PO TABS: 1.0000 | ORAL_TABLET | Freq: Every day | ORAL | Status: DC

## 2016-10-03 MED ORDER — HYDROCHLOROTHIAZIDE 12.5 MG PO CAPS
12.5000 mg | ORAL_CAPSULE | Freq: Every day | ORAL | Status: DC
  Administered 2016-10-03: 12.5 mg via ORAL
  Filled 2016-10-03: qty 1

## 2016-10-03 MED ORDER — OXYCODONE-ACETAMINOPHEN 5-325 MG PO TABS
ORAL_TABLET | ORAL | Status: AC
  Administered 2016-10-03: 1 via ORAL
  Filled 2016-10-03: qty 1

## 2016-10-03 MED ORDER — MIDAZOLAM HCL 5 MG/5ML IJ SOLN
INTRAMUSCULAR | Status: DC | PRN
  Administered 2016-10-03 (×2): 1 mg via INTRAVENOUS

## 2016-10-03 MED ORDER — ONDANSETRON HCL 4 MG/2ML IJ SOLN
INTRAMUSCULAR | Status: DC | PRN
  Administered 2016-10-03: 4 mg via INTRAVENOUS

## 2016-10-03 MED ORDER — SODIUM CHLORIDE 0.9 % IV SOLN
Freq: Once | INTRAVENOUS | Status: AC
  Administered 2016-10-03: 10:00:00 via INTRAVENOUS

## 2016-10-03 MED ORDER — ONDANSETRON HCL 4 MG/2ML IJ SOLN: 4.0000 mg | Freq: Four times a day (QID) | INTRAMUSCULAR | Status: DC | PRN

## 2016-10-03 MED ORDER — POTASSIUM CHLORIDE IN NACL 20-0.9 MEQ/L-% IV SOLN: INTRAVENOUS | Status: DC

## 2016-10-03 MED ORDER — ROCURONIUM BROMIDE 100 MG/10ML IV SOLN
INTRAVENOUS | Status: DC | PRN
  Administered 2016-10-03: 20 mg via INTRAVENOUS
  Administered 2016-10-03: 10 mg via INTRAVENOUS
  Administered 2016-10-03: 50 mg via INTRAVENOUS

## 2016-10-03 MED ORDER — ACETAMINOPHEN 325 MG PO TABS: 650.0000 mg | ORAL_TABLET | ORAL | Status: DC | PRN

## 2016-10-03 MED ORDER — ROCURONIUM BROMIDE 10 MG/ML (PF) SYRINGE
PREFILLED_SYRINGE | INTRAVENOUS | Status: AC
  Filled 2016-10-03: qty 5

## 2016-10-03 MED ORDER — HYDROMORPHONE HCL 1 MG/ML IJ SOLN
0.2500 mg | INTRAMUSCULAR | Status: DC | PRN
Start: 2016-10-03 — End: 2016-10-03
  Administered 2016-10-03: 0.5 mg via INTRAVENOUS

## 2016-10-03 MED ORDER — LIDOCAINE 2% (20 MG/ML) 5 ML SYRINGE
INTRAMUSCULAR | Status: AC
  Filled 2016-10-03: qty 10

## 2016-10-03 MED ORDER — EPHEDRINE SULFATE 50 MG/ML IJ SOLN
INTRAMUSCULAR | Status: DC | PRN
  Administered 2016-10-03: 5 mg via INTRAVENOUS

## 2016-10-03 MED ORDER — OMEPRAZOLE MAGNESIUM 20 MG PO TBEC: 20.0000 mg | DELAYED_RELEASE_TABLET | Freq: Every day | ORAL | Status: DC

## 2016-10-03 MED ORDER — ROSUVASTATIN CALCIUM 20 MG PO TABS
40.0000 mg | ORAL_TABLET | Freq: Every day | ORAL | Status: DC
Start: 2016-10-03 — End: 2016-10-04
  Administered 2016-10-03: 40 mg via ORAL
  Filled 2016-10-03: qty 2

## 2016-10-03 MED ORDER — ARTIFICIAL TEARS OPHTHALMIC OINT
TOPICAL_OINTMENT | OPHTHALMIC | Status: DC | PRN
  Administered 2016-10-03: 1 via OPHTHALMIC

## 2016-10-03 MED ORDER — SENNOSIDES-DOCUSATE SODIUM 8.6-50 MG PO TABS: 1.0000 | ORAL_TABLET | Freq: Every evening | ORAL | Status: DC | PRN

## 2016-10-03 SURGICAL SUPPLY — 77 items
BENZOIN TINCTURE PRP APPL 2/3 (GAUZE/BANDAGES/DRESSINGS) ×3 IMPLANT
BIT DRILL NEURO 2X3.1 SFT TUCH (MISCELLANEOUS) ×1 IMPLANT
BIT DRILL SRG 14X2.2XFLT CHK (BIT) ×1 IMPLANT
BIT DRL SRG 14X2.2XFLT CHK (BIT) ×1
BLADE CLIPPER SURG (BLADE) IMPLANT
BLADE SURG 15 STRL LF DISP TIS (BLADE) ×1 IMPLANT
BLADE SURG 15 STRL SS (BLADE) ×2
BONE VIVIGEN FORMABLE 5.4CC (Bone Implant) ×3 IMPLANT
BUR MATCHSTICK NEURO 3.0 LAGG (BURR) IMPLANT
CARTRIDGE OIL MAESTRO DRILL (MISCELLANEOUS) ×1 IMPLANT
CLOSURE WOUND 1/2 X4 (GAUZE/BANDAGES/DRESSINGS) ×1
COVER SURGICAL LIGHT HANDLE (MISCELLANEOUS) ×3 IMPLANT
CRADLE DONUT ADULT HEAD (MISCELLANEOUS) ×3 IMPLANT
DECANTER SPIKE VIAL GLASS SM (MISCELLANEOUS) ×3 IMPLANT
DIFFUSER DRILL AIR PNEUMATIC (MISCELLANEOUS) ×3 IMPLANT
DRAIN JACKSON RD 7FR 3/32 (WOUND CARE) IMPLANT
DRAPE C-ARM 42X72 X-RAY (DRAPES) ×3 IMPLANT
DRAPE POUCH INSTRU U-SHP 10X18 (DRAPES) ×3 IMPLANT
DRAPE SURG 17X23 STRL (DRAPES) ×9 IMPLANT
DRILL BIT SKYLINE 14MM (BIT) ×2
DRILL NEURO 2X3.1 SOFT TOUCH (MISCELLANEOUS) ×3
DURAPREP 26ML APPLICATOR (WOUND CARE) ×3 IMPLANT
ELECT COATED BLADE 2.86 ST (ELECTRODE) ×3 IMPLANT
ELECT REM PT RETURN 9FT ADLT (ELECTROSURGICAL) ×3
ELECTRODE REM PT RTRN 9FT ADLT (ELECTROSURGICAL) ×1 IMPLANT
EVACUATOR SILICONE 100CC (DRAIN) IMPLANT
GAUZE SPONGE 4X4 12PLY STRL (GAUZE/BANDAGES/DRESSINGS) ×3 IMPLANT
GAUZE SPONGE 4X4 12PLY STRL LF (GAUZE/BANDAGES/DRESSINGS) ×3 IMPLANT
GAUZE SPONGE 4X4 16PLY XRAY LF (GAUZE/BANDAGES/DRESSINGS) ×3 IMPLANT
GLOVE BIO SURGEON STRL SZ7 (GLOVE) ×3 IMPLANT
GLOVE BIO SURGEON STRL SZ8 (GLOVE) ×3 IMPLANT
GLOVE BIOGEL PI IND STRL 7.0 (GLOVE) ×2 IMPLANT
GLOVE BIOGEL PI IND STRL 8 (GLOVE) ×1 IMPLANT
GLOVE BIOGEL PI INDICATOR 7.0 (GLOVE) ×4
GLOVE BIOGEL PI INDICATOR 8 (GLOVE) ×2
GOWN STRL REUS W/ TWL LRG LVL3 (GOWN DISPOSABLE) ×1 IMPLANT
GOWN STRL REUS W/ TWL XL LVL3 (GOWN DISPOSABLE) ×1 IMPLANT
GOWN STRL REUS W/TWL LRG LVL3 (GOWN DISPOSABLE) ×2
GOWN STRL REUS W/TWL XL LVL3 (GOWN DISPOSABLE) ×2
INTERLOCK LRDTC CRVCL VBR 6MM (Bone Implant) ×1 IMPLANT
INTERLOCK LRDTC CRVCL VBR 7MM (Bone Implant) ×2 IMPLANT
INTERLOCK LRDTC CRVCL VBR SM (Bone Implant) ×1 IMPLANT
IV CATH 14GX2 1/4 (CATHETERS) ×3 IMPLANT
KIT BASIN OR (CUSTOM PROCEDURE TRAY) ×3 IMPLANT
KIT ROOM TURNOVER OR (KITS) ×3 IMPLANT
LORDOTIC CERVICAL VBR 6MM SM (Bone Implant) ×3 IMPLANT
LORDOTIC CERVICAL VBR 7MM SM (Bone Implant) ×6 IMPLANT
LORDOTIC CERVICAL VBR SM 5MM (Bone Implant) ×3 IMPLANT
MANIFOLD NEPTUNE II (INSTRUMENTS) ×3 IMPLANT
MARKER SKIN DUAL TIP RULER LAB (MISCELLANEOUS) ×3 IMPLANT
NEEDLE PRECISIONGLIDE 27X1.5 (NEEDLE) ×3 IMPLANT
NEEDLE SPNL 20GX3.5 QUINCKE YW (NEEDLE) ×3 IMPLANT
NS IRRIG 1000ML POUR BTL (IV SOLUTION) ×9 IMPLANT
OIL CARTRIDGE MAESTRO DRILL (MISCELLANEOUS) ×3
PACK ORTHO CERVICAL (CUSTOM PROCEDURE TRAY) ×3 IMPLANT
PAD ARMBOARD 7.5X6 YLW CONV (MISCELLANEOUS) ×6 IMPLANT
PATTIES SURGICAL .5 X.5 (GAUZE/BANDAGES/DRESSINGS) IMPLANT
PATTIES SURGICAL .5 X1 (DISPOSABLE) IMPLANT
PIN DISTRACTION 14 (PIN) ×6 IMPLANT
PLATE SKYLINE 60MM 4LVL (Plate) ×3 IMPLANT
SCREW SKYLINE VAR OS 14MM (Screw) ×30 IMPLANT
SPONGE INTESTINAL PEANUT (DISPOSABLE) ×9 IMPLANT
SPONGE SURGIFOAM ABS GEL 100 (HEMOSTASIS) ×3 IMPLANT
STRIP CLOSURE SKIN 1/2X4 (GAUZE/BANDAGES/DRESSINGS) ×2 IMPLANT
SURGIFLO W/THROMBIN 8M KIT (HEMOSTASIS) IMPLANT
SUT MNCRL AB 4-0 PS2 18 (SUTURE) ×3 IMPLANT
SUT VIC AB 2-0 CT2 18 VCP726D (SUTURE) ×3 IMPLANT
SYR BULB IRRIGATION 50ML (SYRINGE) ×3 IMPLANT
SYR CONTROL 10ML LL (SYRINGE) ×6 IMPLANT
TAPE CLOTH 4X10 WHT NS (GAUZE/BANDAGES/DRESSINGS) ×3 IMPLANT
TAPE CLOTH SURG 4X10 WHT LF (GAUZE/BANDAGES/DRESSINGS) ×3 IMPLANT
TAPE UMBILICAL COTTON 1/8X30 (MISCELLANEOUS) ×3 IMPLANT
TOWEL OR 17X24 6PK STRL BLUE (TOWEL DISPOSABLE) ×3 IMPLANT
TOWEL OR 17X26 10 PK STRL BLUE (TOWEL DISPOSABLE) ×3 IMPLANT
TRAY FOLEY W/METER SILVER 16FR (SET/KITS/TRAYS/PACK) ×3 IMPLANT
WATER STERILE IRR 1000ML POUR (IV SOLUTION) ×3 IMPLANT
YANKAUER SUCT BULB TIP NO VENT (SUCTIONS) ×3 IMPLANT

## 2016-10-03 NOTE — Anesthesia Procedure Notes (Addendum)
Procedure Name: Intubation Date/Time: 10/03/2016 8:57 AM Performed by: Jacquiline Doe A Pre-anesthesia Checklist: Patient identified, Emergency Drugs available, Suction available and Patient being monitored Patient Re-evaluated:Patient Re-evaluated prior to inductionOxygen Delivery Method: Circle System Utilized and Circle system utilized Preoxygenation: Pre-oxygenation with 100% oxygen Intubation Type: IV induction and Cricoid Pressure applied Ventilation: Mask ventilation without difficulty Laryngoscope Size: Mac and 4 Grade View: Grade II Tube type: Oral Tube size: 7.5 mm Number of attempts: 1 Airway Equipment and Method: Stylet and LTA kit utilized Placement Confirmation: ETT inserted through vocal cords under direct vision,  positive ETCO2 and breath sounds checked- equal and bilateral Secured at: 22 cm Tube secured with: Tape Dental Injury: Teeth and Oropharynx as per pre-operative assessment

## 2016-10-03 NOTE — Anesthesia Postprocedure Evaluation (Signed)
Anesthesia Post Note  Patient: EDRA RICCARDI  Procedure(s) Performed: Procedure(s) (LRB): ANTERIOR CERVICAL DECOMPRESSION FUSION, CERVICAL 4-5, CERVICAL 5-6, CERVICAL 6-7, CERVICAL 7 TO THORACIC 1 WITH INSTRUMENTATION AND ALLOGRAFT (Right)     Patient location during evaluation: PACU Anesthesia Type: General Level of consciousness: awake and alert Pain management: pain level controlled Vital Signs Assessment: post-procedure vital signs reviewed and stable Respiratory status: spontaneous breathing, nonlabored ventilation, respiratory function stable and patient connected to nasal cannula oxygen Cardiovascular status: blood pressure returned to baseline and stable Postop Assessment: no signs of nausea or vomiting Anesthetic complications: no    Last Vitals:  Vitals:   10/03/16 1320 10/03/16 1329  BP: (!) 141/74 139/74  Pulse: 62 64  Resp: 11 11  Temp:  36.4 C    Last Pain:  Vitals:   10/03/16 1329  TempSrc:   PainSc: Asleep                 Lempi Edwin,JAMES TERRILL

## 2016-10-03 NOTE — Progress Notes (Signed)
Orthopedic Tech Progress Note Patient Details:  Ashley [name] [birth year] [id number]  Ortho Devices Type of Ortho Device: [place] cervical collar Ortho Device/Splint Location: neck Ortho Device/Splint Interventions: Loanne Drilling, Dolores Mcgovern 10/03/2016, 2:01 PM Viewed order from doctor's order list

## 2016-10-03 NOTE — Anesthesia Preprocedure Evaluation (Addendum)
Anesthesia Evaluation  Patient identified by MRN, date of birth, ID band Patient awake    Reviewed: Allergy & Precautions, NPO status , Unable to perform ROS - Chart review only  Airway Mallampati: II   Neck ROM: Limited    Dental no notable dental hx. (+) Dental Advisory Given   Pulmonary shortness of breath, COPD, Current Smoker,    breath sounds clear to auscultation       Cardiovascular hypertension, + CAD, + Peripheral Vascular Disease and +CHF   Rhythm:Regular Rate:Normal   - Left ventricle: The cavity size was normal. Wall thickness was   normal. Systolic function was mildly to moderately reduced. The   estimated ejection fraction was in the range of 40% to 45%.   Diffuse hypokinesis. Abnormal GLS, -14%. Doppler parameters are   consistent with abnormal left ventricular relaxation (grade 1   diastolic dysfunction). - Aortic valve: There was no stenosis. - Mitral valve: There was trivial regurgitation. - Right ventricle: The cavity size was normal. Systolic function   was normal. - Pulmonary arteries: No complete TR doppler jet so unable to   estimate PA systolic pressure. - Systemic veins: IVC not visualized.    Neuro/Psych    GI/Hepatic GERD  ,  Endo/Other    Renal/GU      Musculoskeletal  (+) Arthritis ,   Abdominal   Peds  Hematology  (+) Blood dyscrasia, anemia ,   Anesthesia Other Findings   Reproductive/Obstetrics                          Anesthesia Physical Anesthesia Plan  ASA: III  Anesthesia Plan: General   Post-op Pain Management:    Induction: Intravenous  PONV Risk Score and Plan: 3 and Ondansetron, Dexamethasone, Propofol and Midazolam  Airway Management Planned: Oral ETT  Additional Equipment: Arterial line  Intra-op Plan:   Post-operative Plan: Extubation in OR and Possible Post-op intubation/ventilation  Informed Consent: I have reviewed the  patients History and Physical, chart, labs and discussed the procedure including the risks, benefits and alternatives for the proposed anesthesia with the patient or authorized representative who has indicated his/her understanding and acceptance.   Dental advisory given  Plan Discussed with: CRNA  Anesthesia Plan Comments: (Will need transfusion and discussed c patient Also discussed possible postop ventilation due to COPD)       Anesthesia Quick Evaluation

## 2016-10-03 NOTE — Transfer of Care (Signed)
Immediate Anesthesia Transfer of Care Note  Patient: Ashley [name]  Procedure(s) Performed: Procedure(s) with comments: ANTERIOR CERVICAL DECOMPRESSION FUSION, CERVICAL 4-5, CERVICAL 5-6, CERVICAL 6-7, CERVICAL 7 TO THORACIC 1 WITH INSTRUMENTATION AND ALLOGRAFT (Right) - ANTERIOR CERVICAL DECOMPRESSION FUSION, CERVICAL 4-5, CERVICAL 5-6, CERVICAL 6-7, CERVICAL 7 TO THORACIC 1 WITH INSTRUMENTATION AND ALLOGRAFT; REQUEST 4 HOURS  Patient Location: PACU  Anesthesia Type:General  Level of Consciousness: awake, oriented, sedated, patient cooperative and responds to stimulation  Airway & Oxygen Therapy: Patient Spontanous Breathing and Patient connected to nasal cannula oxygen  Post-op Assessment: Report given to RN, Post -op Vital signs reviewed and stable, Patient moving all extremities and Patient moving all extremities X 4  Post vital signs: Reviewed and stable  Last Vitals:  Vitals:   10/03/16 0718  BP: (!) 98/51  Pulse: 89  Resp: 16  Temp: 36.6 C    Last Pain:  Vitals:   10/03/16 0747  TempSrc:   PainSc: 5       Patients Stated Pain Goal: 1 (65/79/03 8333)  Complications: No apparent anesthesia complications

## 2016-10-03 NOTE — Anesthesia Postprocedure Evaluation (Signed)
Anesthesia Post Note  Patient: Ashley [name]  Procedure(s) Performed: Procedure(s) (LRB): ANTERIOR CERVICAL DECOMPRESSION FUSION, CERVICAL 4-5, CERVICAL 5-6, CERVICAL 6-7, CERVICAL 7 TO THORACIC 1 WITH INSTRUMENTATION AND ALLOGRAFT (Right)     Patient location during evaluation: PACU Anesthesia Type: General Level of consciousness: awake, oriented and sedated Pain management: pain level controlled Vital Signs Assessment: post-procedure vital signs reviewed and stable Respiratory status: spontaneous breathing, nonlabored ventilation, respiratory function stable and patient connected to nasal cannula oxygen Cardiovascular status: blood pressure returned to baseline and stable Postop Assessment: no signs of nausea or vomiting Anesthetic complications: no    Last Vitals:  Vitals:   10/03/16 1320 10/03/16 1329  BP: (!) 141/74 139/74  Pulse: 62 64  Resp: 11 11  Temp:  36.4 C    Last Pain:  Vitals:   10/03/16 1329  TempSrc:   PainSc: Asleep                 Valleri Hendricksen,JAMES TERRILL

## 2016-10-04 LAB — POCT I-STAT 7, (LYTES, BLD GAS, ICA,H+H)
Acid-base deficit: 1 mmol/L (ref 0.0–2.0)
BICARBONATE: 25.6 mmol/L (ref 20.0–28.0)
Bicarbonate: 24.9 mmol/L (ref 20.0–28.0)
CALCIUM ION: 1.2 mmol/L (ref 1.15–1.40)
Calcium, Ion: 1.19 mmol/L (ref 1.15–1.40)
HCT: 29 % — ABNORMAL LOW (ref 36.0–46.0)
HEMATOCRIT: 28 % — AB (ref 36.0–46.0)
HEMOGLOBIN: 9.5 g/dL — AB (ref 12.0–15.0)
Hemoglobin: 9.9 g/dL — ABNORMAL LOW (ref 12.0–15.0)
O2 Saturation: 100 %
O2 Saturation: 100 %
PCO2 ART: 40 mmHg (ref 32.0–48.0)
PH ART: 7.411 (ref 7.350–7.450)
PO2 ART: 195 mmHg — AB (ref 83.0–108.0)
POTASSIUM: 3.8 mmol/L (ref 3.5–5.1)
POTASSIUM: 3.9 mmol/L (ref 3.5–5.1)
SODIUM: 139 mmol/L (ref 135–145)
Sodium: 139 mmol/L (ref 135–145)
TCO2: 27 mmol/L (ref 0–100)
TCO2: 26 mmol/L (ref 0–100)
pCO2 arterial: 39.3 mmHg (ref 32.0–48.0)
pH, Arterial: 7.393 (ref 7.350–7.450)
pO2, Arterial: 190 mmHg — ABNORMAL HIGH (ref 83.0–108.0)

## 2016-10-04 MED FILL — Thrombin For Soln 20000 Unit: CUTANEOUS | Qty: 1 | Status: AC

## 2016-10-04 NOTE — Care Management CC44 (Signed)
Condition Code 44 Documentation Completed  Patient Details  Name: Ashley [name] MRN: [id number] Date of Birth: 06-Oct-1950   Condition Code 44 given:   yes Patient signature on Condition Code 44 notice:   yes Documentation of 2 MD's agreement:   yes Code 44 added to claim:   yes    Adron Bene, RN 10/04/2016, 10:39 AM

## 2016-10-04 NOTE — Op Note (Signed)
Ashley [name], Ashley [name]                   ACCOUNT NO.:  [id number]  MEDICAL RECORD NO.:  [id number]  LOCATION:                                 FACILITY:  PHYSICIAN:  Phylliss Bob, MD           DATE OF BIRTH:  DATE OF PROCEDURE:  10/03/2016                              OPERATIVE REPORT   PREOPERATIVE DIAGNOSES: 1. Right-sided cervical radiculopathy. 2. Varying degrees of right-sided neuroforaminal stenosis involving C4-     5, C5-6, C6-7, C7-T1.  POSTOPERATIVE DIAGNOSES: 1. Right-sided cervical radiculopathy. 2. Varying degrees of right-sided neuroforaminal stenosis involving C4-     5, C5-6, C6-7, C7-T1.  PROCEDURES: 1. Anterior cervical decompression and fusion; C4-5, C5-6, C6-7, C7-     T1. 2. Placement of anterior instrumentation, C4-T1. 3. Insertion of interbody device x4 (Titan intervertebral spacers). 4. Use of morselized allograft.  SURGEON:  Phylliss Bob, MD  ASSISTANT:  Pricilla Holm, PA-C.  ANESTHESIA:  General endotracheal anesthesia.  COMPLICATIONS:  None.  DISPOSITION:  Stable.  ESTIMATED BLOOD LOSS:  Minimal.  INDICATIONS FOR SURGERY:  Briefly, [name] is a very pleasant 66 year old female, who did present to me with ongoing and rather debilitating pain in her right arm.  Her MRI did reveal findings as outlined above. She did fail appropriate nonoperative treatment measures, and she did wish to proceed with the surgery outlined above.  After full understanding of the risks and benefits of surgery, she did elect to proceed.  OPERATIVE DETAILS:  On October 03, 2016, the patient was brought to surgery and general endotracheal anesthesia was administered.  The patient was placed supine on the hospital bed.  Her neck was gently extended.  Her arms were secured to her sides and all bony prominences were padded. The neck was prepped and draped in the usual fashion, and a left-sided longitudinal incision was made in line with the sternocleidomastoid muscle.  The  platysma was incised, and a Smith-Robinson approach was used.  The vertebral bodies from C4-T1 were identified and subperiosteally exposed.  A self-retaining retractor was placed.  Caspar pins were then placed into the C7 and T1 vertebral bodies and distraction was applied.  I then proceeded with a thorough and complete C7-T1 intervertebral diskectomy.  The right and left neural foramina were decompressed thoroughly.  After preparing the endplates, the appropriate size interbody spacer was packed with ViviGen and tamped into position in usual fashion.  The lower Caspar pin was then removed. The bone wax was placed in its place.  New Caspar pin was placed into the C6 vertebral body, and at this point, distraction was applied across the C6-7 intervertebral space.  As previously discussed, a diskectomy and bilateral neuroforaminal decompression was performed, the endplates were prepared, and the appropriate size interbody spacer was packed with ViviGen and tamped into position.  The lower Caspar pin was then removed.  The bone wax was placed in its place.  A new Caspar pin was placed into the C5 vertebral body and distraction was applied, and once again, a diskectomy was performed, the neural foramina were decompressed, the endplates were prepared, and the appropriate size interbody spacer  was packed with the ViviGen and tamped into position. The lower Caspar pin was removed and placed into the C4 vertebral body. As previously noted, the C4-5 level was decompressed, and after preparing the endplates, the appropriate size interbody spacer was packed with ViviGen and tamped into position.  The Caspar pins were then removed, and bone wax was placed in their place.  The appropriate sized anterior cervical plate was placed over the anterior spine.  14 mm screws were placed, 2 in each vertebral body from C4-T1 for a total of 10 vertebral body screws.  The screws were then locked to the plate using  the Cam-locking mechanism.  The wound was then explored for any undue bleeding, and there was no substantial bleeding encountered.  The platysma was then closed using 2-0 Vicryl, and the skin was closed using 4-0 Monocryl.  Benzoin and Steri-Strips were applied followed by sterile dressing.  All instrument counts were correct at the termination of the procedure.  Of note, Pricilla Holm was my assistant throughout surgery, and did aid in retraction, suctioning, and closure from start to finish.     Phylliss Bob, MD   ______________________________ Phylliss Bob, MD    MD/MEDQ  D:  10/03/2016  T:  10/03/2016  Job:  [id number]

## 2016-10-04 NOTE — Progress Notes (Signed)
    Patient doing well Minimal neck pain Patient significant improvement in R arm pain, with just some minimal tingling Has been eating and drinking and ambulating   Physical Exam: Vitals:   10/04/16 0013 10/04/16 0413  BP: (!) 110/55 103/85  Pulse: 88 72  Resp: 18 18  Temp: 98.3 F (36.8 C) 98.5 F (36.9 C)    Dressing in place NVI Neck soft/supple  POD #1 s/p C4-T1 ACDF  - encourage ambulation - Percocet for pain, Valium for muscle spasms - d/c home later today with f/u in 2 weeks

## 2016-10-04 NOTE — Care Management Obs Status (Signed)
[facility] NOTIFICATION   Patient Details  Name: Ashley [name] MRN: [id number] Date of Birth: June 12, 1950   Medicare Observation Status Notification Given:  Yes    Jenica Costilow, Rory Percy, RN 10/04/2016, 10:39 AM

## 2016-10-04 NOTE — Progress Notes (Signed)
Patient alert and oriented, mae's well, voiding adequate amount of urine, swallowing without difficulty, no c/o pain at time of discharge. Patient discharged home with family. Script and discharged instructions given to patient. Patient and family stated understanding of instructions given. Patient has an appointment with Dr. Dumonski 

## 2016-10-05 ENCOUNTER — Encounter (HOSPITAL_COMMUNITY): Payer: Self-pay | Admitting: Orthopedic Surgery

## 2016-10-05 LAB — TYPE AND SCREEN
ABO/RH(D): AB POS
Antibody Screen: NEGATIVE
UNIT DIVISION: 0
Unit division: 0
Unit division: 0
Unit division: 0

## 2016-10-05 LAB — BPAM RBC
BLOOD PRODUCT EXPIRATION DATE: 201807022359
Blood Product Expiration Date: 201807012359
Blood Product Expiration Date: 201807102359
Blood Product Expiration Date: 201807102359
ISSUE DATE / TIME: 201806200909
ISSUE DATE / TIME: 201806200909
UNIT TYPE AND RH: 6200
Unit Type and Rh: 6200
Unit Type and Rh: 6200
Unit Type and Rh: 6200

## 2016-10-11 NOTE — Discharge Summary (Signed)
Patient ID: Ashley [name] MRN: [id number] DOB/AGE: [birth year] 66 y.o.  Admit date: 10/03/2016 Discharge date: 10/04/2016  Admission Diagnoses:  Active Problems:   Radiculopathy   Discharge Diagnoses:  Same  Past Medical History:  Diagnosis Date  . Arthritis   . Ascending aortic aneurysm ([place])   . Cancer [facility]) 1995   breast, left, mastectomy/chemo  . Chest pain    Possibly cardiac. No evidence of ischemia/injury based upon normal troponin I. Chest discomfort could be tachycardia induced supply demand mismatch.   . CHF (congestive heart failure) ([place]) 11/17/2015   after surgery   . Colon adenomas   . Coronary artery disease   . Dyspnea    with exertion  . Essential hypertension, benign   . GERD (gastroesophageal reflux disease)   . Hyperlipidemia   . Hypertension   . Palpitations   . Pernicious anemia 03/06/2016  . Pure hypercholesterolemia   . Thrombocytosis ([facility])    Idiopathic    Surgeries: Procedure(s): ANTERIOR CERVICAL DECOMPRESSION FUSION, CERVICAL 4-5, CERVICAL 5-6, CERVICAL 6-7, CERVICAL 7 TO THORACIC 1 WITH INSTRUMENTATION AND ALLOGRAFT on 10/03/2016   Consultants: None  Discharged Condition: Improved  Hospital Course: Ashley [name] is an 66 y.o. female who was admitted 10/03/2016 for operative treatment of myeloradiculopathy. Patient has severe unremitting pain that affects sleep, daily activities, and work/hobbies. After pre-op clearance the patient was taken to the operating room on 10/03/2016 and underwent  Procedure(s): ANTERIOR CERVICAL DECOMPRESSION FUSION, CERVICAL 4-5, CERVICAL 5-6, CERVICAL 6-7, CERVICAL 7 TO THORACIC 1 WITH INSTRUMENTATION AND ALLOGRAFT.    Patient was given perioperative antibiotics:  Anti-infectives    Start     Dose/Rate Route Frequency Ordered Stop   10/03/16 2100  vancomycin (VANCOCIN) IVPB 1000 mg/200 mL premix     1,000 mg 200 mL/hr over 60 Minutes Intravenous  Once 10/03/16 1355 10/03/16 2231   10/03/16 0815  vancomycin  (VANCOCIN) IVPB 1000 mg/200 mL premix     1,000 mg 200 mL/hr over 60 Minutes Intravenous On call to O.R. 10/02/16 1436 10/03/16 0930       Patient was given sequential compression devices, early ambulation to prevent DVT.  Patient benefited maximally from hospital stay and there were no complications.    Recent vital signs: BP 138/65 (BP Location: Right Arm)   Pulse (!) 58   Temp 98 F (36.7 C) (Oral)   Resp 18   Ht 5\' 2"  (1.575 m)   Wt 52.6 kg (116 lb)   SpO2 100%   BMI 21.22 kg/m   Discharge Medications:   Allergies as of 10/04/2016      Reactions   Penicillins Hives, Rash   Has patient had a PCN reaction causing immediate rash, facial/tongue/throat swelling, SOB or lightheadedness with hypotension: Yes Has patient had a PCN reaction causing severe rash involving mucus membranes or skin necrosis: Yes Has patient had a PCN reaction that required hospitalization No Has patient had a PCN reaction occurring within the last 10 years: Yes If all of the above answers are "NO", then may proceed with Cephalosporin use.   Tape Rash   Medipore, Coban, and paper tape CAN be tolerated      Medication List    TAKE these medications   aspirin EC 81 MG tablet Take 81 mg by mouth daily.   CALCIUM 600+D 600-800 MG-UNIT Tabs Generic drug:  Calcium Carb-Cholecalciferol Take 1 tablet by mouth 2 (two) times daily.   clonazePAM 0.5 MG tablet Commonly known as:  Bobbye [place]  TAKE (1) TABLET BY MOUTH AT BEDTIME.   cyanocobalamin 1000 MCG/ML injection Commonly known as:  (VITAMIN B-12) INJECT 1 ML INTO THE MUSCLE ONCE MONTHLY AS DIRECTED.   furosemide 40 MG tablet Commonly known as:  LASIX Take 1 tablet (40 mg total) by mouth daily as needed for edema.   gabapentin 300 MG capsule Commonly known as:  NEURONTIN Take 300 mg by mouth at bedtime.   hydroxyurea 500 MG capsule Commonly known as:  HYDREA Take 500-1,000 mg by mouth as directed. Take 500mg  by mouth daily on Mondays and  Tuesdays and 1000mg  by mouth all other days of the week.  May take with food to minimize GI side effects.   ibandronate 150 MG tablet Commonly known as:  BONIVA Take 1 tablet by mouth every 30 (thirty) days.   ipratropium-albuterol 0.5-2.5 (3) MG/3ML Soln Commonly known as:  DUONEB Take 3 mLs by nebulization every 6 (six) hours as needed (shortness of breath).   irbesartan-hydrochlorothiazide 150-12.5 MG tablet Commonly known as:  AVALIDE Take 1 tablet by mouth daily. Please hold if blood pressure less than 140/90.   methocarbamol 500 MG tablet Commonly known as:  ROBAXIN Take 500 mg by mouth every 6 (six) hours as needed for muscle spasms.   omeprazole 20 MG tablet Commonly known as:  PRILOSEC OTC Take 20 mg by mouth daily.   potassium chloride SA 20 MEQ tablet Commonly known as:  K-DUR,KLOR-CON TAKE ONE TABLET BY MOUTH 3 TIMES A DAY.   rosuvastatin 40 MG tablet Commonly known as:  CRESTOR Take 40 mg by mouth daily.   traMADol 50 MG tablet Commonly known as:  ULTRAM Take 50-100 mg by mouth 3 (three) times daily as needed (pain).   Vitamin D-3 1000 units Caps Take 1 capsule by mouth daily.       Diagnostic Studies: Dg Cervical Spine 1 View  Result Date: 10/03/2016 CLINICAL DATA:  ACDF C4 through T1 EXAM: DG C-ARM 61-120 MIN; DG CERVICAL SPINE - 1 VIEW COMPARISON:  MR cervical spine 08/14/2016 FLUOROSCOPY TIME:  0 minutes 17 seconds Images obtained:  1 FINDINGS: Single cross-table lateral intraoperative view. Bones appear demineralized. Anterior plate and screws identified at C4 through at least C7 with intervening disc prostheses. C7-T1 disc space and T1 vertebral body not visualized due to superimposition of the shoulders. No obvious fracture or subluxation. Anterior support tubes and surgical sponges noted in the cervical region. IMPRESSION: Anterior cervical spine fusion as above. Electronically Signed   By: Lavonia Dana M.D.   On: 10/03/2016 12:52   Dg C-arm 1-60  Min  Result Date: 10/03/2016 CLINICAL DATA:  ACDF C4 through T1 EXAM: DG C-ARM 61-120 MIN; DG CERVICAL SPINE - 1 VIEW COMPARISON:  MR cervical spine 08/14/2016 FLUOROSCOPY TIME:  0 minutes 17 seconds Images obtained:  1 FINDINGS: Single cross-table lateral intraoperative view. Bones appear demineralized. Anterior plate and screws identified at C4 through at least C7 with intervening disc prostheses. C7-T1 disc space and T1 vertebral body not visualized due to superimposition of the shoulders. No obvious fracture or subluxation. Anterior support tubes and surgical sponges noted in the cervical region. IMPRESSION: Anterior cervical spine fusion as above. Electronically Signed   By: Lavonia Dana M.D.   On: 10/03/2016 12:52    Disposition: 01-Home or Self Care  POD #1 s/p C4-T1 ACDF  -Written scripts for pain signed and in chart -D/C instructions sheet printed and in chart -D/C today  -F/U in office 2 weeks   Signed: Codie Krogh,  Niyam Bisping J 10/11/2016, 8:34 AM

## 2016-10-15 ENCOUNTER — Encounter (HOSPITAL_COMMUNITY): Payer: Self-pay | Admitting: Oncology

## 2016-10-15 ENCOUNTER — Encounter (HOSPITAL_COMMUNITY): Payer: Medicare Other | Attending: Oncology

## 2016-10-15 ENCOUNTER — Encounter (HOSPITAL_BASED_OUTPATIENT_CLINIC_OR_DEPARTMENT_OTHER): Payer: Medicare Other | Admitting: Oncology

## 2016-10-15 ENCOUNTER — Ambulatory Visit (HOSPITAL_COMMUNITY): Payer: Medicare Other

## 2016-10-15 ENCOUNTER — Other Ambulatory Visit (HOSPITAL_COMMUNITY): Payer: Medicare Other

## 2016-10-15 ENCOUNTER — Other Ambulatory Visit: Payer: Self-pay | Admitting: Adult Health

## 2016-10-15 VITALS — BP 104/66 | HR 66 | Resp 16 | Wt 106.8 lb

## 2016-10-15 DIAGNOSIS — C4372 Malignant melanoma of left lower limb, including hip: Secondary | ICD-10-CM

## 2016-10-15 DIAGNOSIS — D510 Vitamin B12 deficiency anemia due to intrinsic factor deficiency: Secondary | ICD-10-CM

## 2016-10-15 DIAGNOSIS — R634 Abnormal weight loss: Secondary | ICD-10-CM

## 2016-10-15 DIAGNOSIS — D473 Essential (hemorrhagic) thrombocythemia: Secondary | ICD-10-CM | POA: Diagnosis present

## 2016-10-15 DIAGNOSIS — C50912 Malignant neoplasm of unspecified site of left female breast: Secondary | ICD-10-CM

## 2016-10-15 DIAGNOSIS — E876 Hypokalemia: Secondary | ICD-10-CM

## 2016-10-15 DIAGNOSIS — Z853 Personal history of malignant neoplasm of breast: Secondary | ICD-10-CM

## 2016-10-15 DIAGNOSIS — Z85820 Personal history of malignant melanoma of skin: Secondary | ICD-10-CM | POA: Diagnosis not present

## 2016-10-15 LAB — CBC WITH DIFFERENTIAL/PLATELET
Basophils Absolute: 0 10*3/uL (ref 0.0–0.1)
Basophils Relative: 0 %
EOS ABS: 0.3 10*3/uL (ref 0.0–0.7)
Eosinophils Relative: 4 %
HCT: 31.3 % — ABNORMAL LOW (ref 36.0–46.0)
Hemoglobin: 10.7 g/dL — ABNORMAL LOW (ref 12.0–15.0)
Lymphocytes Relative: 25 %
Lymphs Abs: 1.6 10*3/uL (ref 0.7–4.0)
MCH: 37.7 pg — ABNORMAL HIGH (ref 26.0–34.0)
MCHC: 34.2 g/dL (ref 30.0–36.0)
MCV: 110.2 fL — ABNORMAL HIGH (ref 78.0–100.0)
MONO ABS: 0.4 10*3/uL (ref 0.1–1.0)
Monocytes Relative: 6 %
NEUTROS PCT: 65 %
Neutro Abs: 4.1 10*3/uL (ref 1.7–7.7)
PLATELETS: 389 10*3/uL (ref 150–400)
RBC: 2.84 MIL/uL — ABNORMAL LOW (ref 3.87–5.11)
RDW: 15.6 % — ABNORMAL HIGH (ref 11.5–15.5)
WBC: 6.4 10*3/uL (ref 4.0–10.5)

## 2016-10-15 LAB — COMPREHENSIVE METABOLIC PANEL
ALT: 12 U/L — ABNORMAL LOW (ref 14–54)
ANION GAP: 10 (ref 5–15)
AST: 24 U/L (ref 15–41)
Albumin: 3.8 g/dL (ref 3.5–5.0)
Alkaline Phosphatase: 76 U/L (ref 38–126)
BUN: 22 mg/dL — ABNORMAL HIGH (ref 6–20)
CHLORIDE: 100 mmol/L — AB (ref 101–111)
CO2: 24 mmol/L (ref 22–32)
Calcium: 9.2 mg/dL (ref 8.9–10.3)
Creatinine, Ser: 1.09 mg/dL — ABNORMAL HIGH (ref 0.44–1.00)
GFR calc non Af Amer: 51 mL/min — ABNORMAL LOW (ref >60)
GFR, EST AFRICAN AMERICAN: 59 mL/min — AB (ref >60)
Glucose, Bld: 151 mg/dL — ABNORMAL HIGH (ref 65–99)
Potassium: 3.9 mmol/L (ref 3.5–5.1)
SODIUM: 134 mmol/L — AB (ref 135–145)
Total Bilirubin: 0.6 mg/dL (ref 0.3–1.2)
Total Protein: 7.4 g/dL (ref 6.5–8.1)

## 2016-10-15 MED ORDER — POTASSIUM CHLORIDE CRYS ER 20 MEQ PO TBCR: EXTENDED_RELEASE_TABLET | ORAL | 2 refills | Status: DC

## 2016-10-15 MED ORDER — DRONABINOL 5 MG PO CAPS: 5.0000 mg | ORAL_CAPSULE | Freq: Two times a day (BID) | ORAL | 0 refills | Status: DC

## 2016-10-15 NOTE — Patient Instructions (Signed)
[place] at [facility] Discharge Instructions  RECOMMENDATIONS MADE BY THE CONSULTANT AND ANY TEST RESULTS WILL BE SENT TO YOUR REFERRING PHYSICIAN.  You were seen by Kirby Crigler PA-C today Continue Hydrea 500mg  Monday and Tuesday 1000mg  wed-Sunday Sent in a script for potassium Script given for [employer]  In 4 months, and follow up in 4 months.  Thank you for choosing [place] at [facility] to provide your oncology and hematology care.  To afford each patient quality time with our provider, please arrive at least 15 minutes before your scheduled appointment time.    If you have a lab appointment with the [facility] please come in thru the  Main Entrance and check in at the main information desk  You need to re-schedule your appointment should you arrive 10 or more minutes late.  We strive to give you quality time with our providers, and arriving late affects you and other patients whose appointments are after yours.  Also, if you no show three or more times for appointments you may be dismissed from the clinic at the providers discretion.     Again, thank you for choosing [facility].  Our hope is that these requests will decrease the amount of time that you wait before being seen by our physicians.       _____________________________________________________________  Should you have questions after your visit to [facility], please contact our office at [phone] between the hours of 8:30 a.m. and 4:30 p.m.  Voicemails left after 4:30 p.m. will not be returned until the following business day.  For prescription refill requests, have your pharmacy contact our office.       Resources For Cancer Patients and their Caregivers ? American Cancer Society: Can assist with transportation, wigs, general needs, runs Look Good Feel Better.        205-234-2806 ? Cancer Care: Provides financial assistance,  online support groups, medication/co-pay assistance.  1-800-813-HOPE 7822691358) ? [place] Assists [place] cancer patients and their families through emotional , educational and financial support.  223-604-6227 ? [place] DSS Where to apply for food stamps, Medicaid and utility assistance. (707)662-0945 ? [facility]: Transportation to medical appointments. (772)144-8351 ? Social Security Administration: May apply for disability if have a Stage IV cancer. 254-059-2912 782 079 0109 ? [employer], Disability and Transit Services: Assists with nutrition, care and transit needs. [place] Support Programs: @10RELATIVEDAYS @ > Cancer Support Group  2nd Tuesday of the month 1pm-2pm, Journey Room  > Creative Journey  3rd Tuesday of the month 1130am-1pm, Journey Room  > Look Good Feel Better  1st Wednesday of the month 10am-12 noon, Journey Room (Call [facility] to register [phone])

## 2016-10-15 NOTE — Assessment & Plan Note (Signed)
Melanoma of L upper/thigh buttocks.  S/P excision of lesion with clear margins identifying a Breslow depth of 1.02 mm, pT2A.

## 2016-10-15 NOTE — Assessment & Plan Note (Signed)
Pernicious anemia on IM B12 injections monthly at home.

## 2016-10-15 NOTE — Assessment & Plan Note (Addendum)
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  Labs today: CBC diff, CMET.  I personally reviewed and went over laboratory results with the patient.  The results are noted within this dictation.  Labs in 4 months: CBC diff, CMET.  Weight loss is noted.  Suspect it is secondary to recent surgery and anesthesia resulting in decreased appetite.  Patient's daughter interjects a similar situation with the patient post-operatively in the past requiring short-term Marinol to stimulate her appetite.  She is interested in this medication again.  Rx is printed for 5 mg Marinol PO BID.  This is a short-term medication.  Patient and daughter advised that this medication will likely require prior-authorization and therefore she will unlikely be able to get this medication today.  Once notified, we will go through the authorization process.  I am hopeful we can get this medication approved for the patient.  She is not a good candidate for Megace due to her cancer history, recent surgery and immobility, and smoking history due to the increased risk of blood clots.  Corticosteroids would not be a good treatment option either given their potential side effects.  I have refilled her K+ as well and escribed this medication to her pharmacy.  Return in 4 months for follow-up.

## 2016-10-15 NOTE — Progress Notes (Signed)
[name], NP 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  INTERVAL HISTORY: DANNIE WOOLEN 66 y.o. female returns for followup of essential thrombocytosis well controlled on Hydrea AND Pernicious anemia, on B12 injection monthly at home. AND H/O breast cancer, Stage II, BRCA negative AND H/O melanoma, T2AN0 AND Macrocytosis secondary to [facility] AND Anterior cervical decompression fusion on 10/03/2016 by Dr. Phylliss Bob.     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 The 3 liver lesions are not hypermetabolic. The radiologist suspects they may be subtly present on prior CT chest from 10/2013, to further reassuring that these are likely benign lesions.       Melanoma ([place])   07/09/2013 Initial Biopsy    Initial biopsy L upper thigh/buttocks melanoma      07/20/2013 Surgery    Excision L lateral  buttock/upper thigh melanoma, clear margins      07/20/2013 Pathology Results    Breslow depth 1.02 mm, pT2a        HPI Elements   Location: Blood  Quality: MPD  Severity: Severe  Duration: years  Context:   Timing:   Modifying Factors: H/O breast cancer, S/P systemic chemotherapy  Associated Signs & Symptoms:    She was recently discharged from the hospital on 10/04/2016 following an anterior cervical decompression fusion, cervical 4-5, cervical 5-6, cervical 6-7, surgical 7 to thoracic 1 with instrumentation and allograft.  This was performed by Dr. Phylliss Bob and completed for myeloradiculopathy for severe and unremitting pain affecting sleep, daily activities, work/hobbies.  She reports compliance with her medications and she confirms her Hydrea dosing with me.   Review of Systems  Constitutional: Positive for weight loss. Negative for chills and fever.  HENT: Negative.   Eyes: Negative.   Respiratory: Negative.  Negative for cough.   Cardiovascular: Negative.  Negative for chest pain.  Gastrointestinal: Negative.  Negative for blood in stool, constipation, diarrhea, melena, nausea and vomiting.  Genitourinary: Negative.   Musculoskeletal: Negative.   Skin: Negative.   Neurological: Negative.  Negative for weakness.  Endo/Heme/Allergies: Negative.   Psychiatric/Behavioral: Negative.     Past Medical History:  Diagnosis Date  . Adenocarcinoma of left breast ([place]) 01/09/2016  . Arthritis   . Ascending aortic aneurysm ([place])   . Cancer [facility]) 1995   breast, left, mastectomy/chemo  .  Chest pain    Possibly cardiac. No evidence of ischemia/injury based upon normal troponin I. Chest discomfort could be tachycardia induced supply demand mismatch.   . CHF (congestive heart failure) ([place]) 11/17/2015   after surgery   . Colon adenomas   . Coronary artery disease   . Dyspnea    with exertion  . Essential hypertension, benign   . GERD (gastroesophageal reflux disease)     . Hyperlipidemia   . Hypertension   . Melanoma ([place]) 01/09/2016  . Palpitations   . Pernicious anemia 03/06/2016  . Pure hypercholesterolemia   . Thrombocythemia, essential ([place]) 01/09/2016  . Thrombocytosis ([employer])    Idiopathic    Past Surgical History:  Procedure Laterality Date  . ANTERIOR AND POSTERIOR REPAIR N/A 12/09/2014   Procedure: ANTERIOR (CYSTOCELE) AND POSTERIOR REPAIR (RECTOCELE);  Surgeon: Bjorn Loser, MD;  Location: [facility];  Service: Urology;  Laterality: N/A;  . ANTERIOR CERVICAL DECOMPRESSION/DISCECTOMY FUSION 4 LEVELS Right 10/03/2016   Procedure: ANTERIOR CERVICAL DECOMPRESSION FUSION, CERVICAL 4-5, CERVICAL 5-6, CERVICAL 6-7, CERVICAL 7 TO THORACIC 1 WITH INSTRUMENTATION AND ALLOGRAFT;  Surgeon: Phylliss Bob, MD;  Location: [facility];  Service: Orthopedics;  Laterality: Right;  ANTERIOR CERVICAL DECOMPRESSION FUSION, CERVICAL 4-5, CERVICAL 5-6, CERVICAL 6-7, CERVICAL 7 TO THORACIC 1 WITH INSTRUMENTATION AND ALLOGRAFT; REQUEST 4 HO  . ANTERIOR LAT LUMBAR FUSION Left 11/16/2015   Procedure: LEFT SIDED LATERAL INTERBODY FUSION, LUMBAR 2-3, LUMBAR 3-4, LUMBAR 4-5 WITH INSTRUMENTATION;  Surgeon: Phylliss Bob, MD;  Location: [facility];  Service: Orthopedics;  Laterality: Left;  LEFT SIDED LATEARL INTERBODY FUSION, LUMBAR 2-3, LUMBAR 3-4, LUMBAR 4-5 WITH INSTRUMENTATION   . APPENDECTOMY    . BONE MARROW ASPIRATION  07/2012  . BONE MARROW BIOPSY  07/2012  . BREAST SURGERY    . CARDIAC CATHETERIZATION    . CARDIAC CATHETERIZATION N/A 01/20/2016   Procedure: Left Heart Cath and Coronary Angiography;  Surgeon: Burnell Blanks, MD;  Location: [facility];  Service: Cardiovascular;  Laterality: N/A;  . COLONOSCOPY  11/29/2010   Procedure: COLONOSCOPY;  Surgeon: Rogene Houston, MD;  [facility];  Service: Endoscopy;  Laterality: N/A;  . COLONOSCOPY N/A 02/18/2014   Procedure: COLONOSCOPY;  Surgeon: Rogene Houston, MD;  [facility];  Service:  Endoscopy;  Laterality: N/A;  1030  . CYSTOSCOPY N/A 12/09/2014   Procedure: CYSTOSCOPY;  Surgeon: Bjorn Loser, MD;  Location: [facility];  Service: Urology;  Laterality: N/A;  . IR GENERIC HISTORICAL  01/11/2016   IR RADIOLOGY PERIPHERAL GUIDED IV START 01/11/2016 Saverio Danker, PA-C [facility]  . IR GENERIC HISTORICAL  01/11/2016   IR US GUIDE VASC ACCESS RIGHT 01/11/2016 Saverio Danker, PA-C [facility]  . MASTECTOMY     left  . OVARIAN CYST SURGERY     x2  . SALPINGOOPHORECTOMY Bilateral 12/09/2014   Procedure: SALPINGO OOPHORECTOMY;  Surgeon: Servando Salina, MD;  Location: [facility];  Service: Gynecology;  Laterality: Bilateral;  . TUBAL LIGATION    . VAGINAL HYSTERECTOMY N/A 12/09/2014   Procedure: HYSTERECTOMY VAGINAL;  Surgeon: Servando Salina, MD;  Location: [facility];  Service: Gynecology;  Laterality: N/A;    Family History  Problem Relation Age of Onset  . Hypertension Mother   . Heart failure Mother   . Congestive Heart Failure Mother   . COPD Mother   . Pernicious anemia Mother   . Cancer Mother        lung  . Hypertension Father   . CAD  Father   . Heart attack Father   . Hypertension Sister   . Cancer Other   . Celiac disease Other     Social History   Social History  . Marital status: Divorced    Spouse name: N/A  . Number of children: 2  . Years of education: N/A   Social History Main Topics  . Smoking status: Current Every Day Smoker    Packs/day: 0.50    Years: 50.00    Types: Cigarettes  . Smokeless tobacco: Never Used  . Alcohol use No  . Drug use: No  . Sexual activity: Yes    Birth control/ protection: Post-menopausal   Other Topics Concern  . Not on file   Social History Narrative  . No narrative on file     PHYSICAL EXAMINATION  ECOG PERFORMANCE STATUS: 1 - Symptomatic but completely ambulatory  Vitals:   10/15/16 1438  BP: 104/66  Pulse: 66  Resp: 16    GENERAL:alert, no distress, well nourished, well developed,  comfortable, cooperative, smiling and chronically ill appearing, neck brace in place, accompanied by daughter, [place]. SKIN: skin color, texture, turgor are normal, no rashes or significant lesions HEAD: Normocephalic, No masses, lesions, tenderness or abnormalities EYES: normal, EOMI EARS: External ears normal OROPHARYNX:lips, buccal mucosa, and tongue normal and mucous membranes are moist  NECK: supple, trachea midline, neck immobilization brace in place LYMPH:  not examined BREAST:not examined LUNGS: clear to auscultation , decreased breath sounds HEART: regular rate & rhythm, no murmurs and no gallops ABDOMEN:abdomen soft and normal bowel sounds BACK: Back symmetric, no curvature. EXTREMITIES:less then 2 second capillary refill, no joint deformities, effusion, or inflammation, no skin discoloration, no cyanosis  NEURO: alert & oriented x 3 with fluent speech, no focal motor/sensory deficits, gait normal   LABORATORY DATA: CBC    Component Value Date/Time   WBC 6.4 10/15/2016 1352   RBC 2.84 (L) 10/15/2016 1352   HGB 10.7 (L) 10/15/2016 1352   HCT 31.3 (L) 10/15/2016 1352   PLT 389 10/15/2016 1352   MCV 110.2 (H) 10/15/2016 1352   MCH 37.7 (H) 10/15/2016 1352   MCHC 34.2 10/15/2016 1352   RDW 15.6 (H) 10/15/2016 1352   LYMPHSABS 1.6 10/15/2016 1352   MONOABS 0.4 10/15/2016 1352   EOSABS 0.3 10/15/2016 1352   BASOSABS 0.0 10/15/2016 1352      Chemistry      Component Value Date/Time   NA 134 (L) 10/15/2016 1352   K 3.9 10/15/2016 1352   CL 100 (L) 10/15/2016 1352   CO2 24 10/15/2016 1352   BUN 22 (H) 10/15/2016 1352   CREATININE 1.09 (H) 10/15/2016 1352      Component Value Date/Time   CALCIUM 9.2 10/15/2016 1352   ALKPHOS 76 10/15/2016 1352   AST 24 10/15/2016 1352   ALT 12 (L) 10/15/2016 1352   BILITOT 0.6 10/15/2016 1352        PENDING LABS:   RADIOGRAPHIC STUDIES:  Dg Cervical Spine 1 View  Result Date: 10/03/2016 CLINICAL DATA:  ACDF C4 through T1  EXAM: DG C-ARM 61-120 MIN; DG CERVICAL SPINE - 1 VIEW COMPARISON:  MR cervical spine 08/14/2016 FLUOROSCOPY TIME:  0 minutes 17 seconds Images obtained:  1 FINDINGS: Single cross-table lateral intraoperative view. Bones appear demineralized. Anterior plate and screws identified at C4 through at least C7 with intervening disc prostheses. C7-T1 disc space and T1 vertebral body not visualized due to superimposition of the shoulders. No obvious fracture or subluxation. Anterior support  tubes and surgical sponges noted in the cervical region. IMPRESSION: Anterior cervical spine fusion as above. Electronically Signed   By: Lavonia Dana M.D.   On: 10/03/2016 12:52   Dg C-arm 1-60 Min  Result Date: 10/03/2016 CLINICAL DATA:  ACDF C4 through T1 EXAM: DG C-ARM 61-120 MIN; DG CERVICAL SPINE - 1 VIEW COMPARISON:  MR cervical spine 08/14/2016 FLUOROSCOPY TIME:  0 minutes 17 seconds Images obtained:  1 FINDINGS: Single cross-table lateral intraoperative view. Bones appear demineralized. Anterior plate and screws identified at C4 through at least C7 with intervening disc prostheses. C7-T1 disc space and T1 vertebral body not visualized due to superimposition of the shoulders. No obvious fracture or subluxation. Anterior support tubes and surgical sponges noted in the cervical region. IMPRESSION: Anterior cervical spine fusion as above. Electronically Signed   By: Lavonia Dana M.D.   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  Labs today: CBC diff, CMET.  I personally reviewed and went over laboratory results with the patient.  The results are noted within this dictation.  Labs in 4 months: CBC diff, CMET.  Weight loss is noted.  Suspect it is secondary to recent surgery and anesthesia resulting in decreased appetite.  Patient's daughter interjects a similar situation with the patient post-operatively in the past  requiring short-term Marinol to stimulate her appetite.  She is interested in this medication again.  Rx is printed for 5 mg Marinol PO BID.  This is a short-term medication.  Patient and daughter advised that this medication will likely require prior-authorization and therefore she will unlikely be able to get this medication today.  Once notified, we will go through the authorization process.  I am hopeful we can get this medication approved for the patient.  She is not a good candidate for Megace due to her cancer history, recent surgery and immobility, and smoking history due to the increased risk of blood clots.  Corticosteroids would not be a good treatment option either given their potential side effects.  I have refilled her K+ as well and escribed this medication to her pharmacy.  Return in 4 months for follow-up.  Adenocarcinoma of left breast ([place]) H/O breast cancer, Stage II, BRCA negative, ER/PR negative with a single lymph node positive for metastatic disease in left axilla.  S/P AC every 3 weeks x 6 cycles (1062-6948) followed by left modiefied radical mastectomy.  Melanoma ([place]) Melanoma of L upper/thigh buttocks.  S/P excision of lesion with clear margins identifying a Breslow depth of 1.02 mm, pT2A.  Pernicious anemia Pernicious anemia on IM B12 injections monthly at home.   ORDERS PLACED FOR THIS ENCOUNTER: Orders Placed This Encounter  Procedures  . CBC with Differential  . Comprehensive metabolic panel    MEDICATIONS PRESCRIBED THIS ENCOUNTER: Meds ordered this encounter  Medications  . dronabinol (MARINOL) 5 MG capsule    Sig: Take 1 capsule (5 mg total) by mouth 2 (two) times daily before a meal.    Dispense:  60 capsule    Refill:  0    Order Specific Question:   Supervising Provider    Answer:   Brunetta Genera [[id number]]  . potassium chloride SA (K-DUR,KLOR-CON) 20 MEQ tablet    Sig: TAKE ONE TABLET BY MOUTH 3 TIMES A DAY.    Dispense:  60 tablet     Refill:  2    Order Specific Question:  Supervising Provider    Answer:   Brunetta Genera [[id number]]    THERAPY PLAN:  Continue with Hydrea and ongoing monitoring of platelet count.  Goal of therapy is to maintain a platelet count of < 450,000.  All questions were answered. The patient knows to call the clinic with any problems, questions or concerns. We can certainly see the patient much sooner if necessary.  Patient and plan discussed with Dr. Twana First and she is in agreement with the aforementioned.   This note is electronically signed by: Doy Mince 10/15/2016 3:16 PM

## 2016-10-15 NOTE — Assessment & Plan Note (Signed)
H/O breast cancer, Stage II, BRCA negative, ER/PR negative with a single lymph node positive for metastatic disease in left axilla.  S/P AC every 3 weeks x 6 cycles (1995-1996) followed by left modiefied radical mastectomy. 

## 2016-10-22 ENCOUNTER — Encounter (HOSPITAL_COMMUNITY): Payer: Self-pay | Admitting: Oncology

## 2016-10-25 ENCOUNTER — Telehealth (HOSPITAL_COMMUNITY): Payer: Self-pay | Admitting: Oncology

## 2016-11-13 ENCOUNTER — Other Ambulatory Visit (HOSPITAL_COMMUNITY): Payer: Self-pay | Admitting: Emergency Medicine

## 2016-11-13 MED ORDER — HYDROXYUREA 500 MG PO CAPS: 500.0000 mg | ORAL_CAPSULE | ORAL | 2 refills | Status: DC

## 2016-11-22 ENCOUNTER — Other Ambulatory Visit (HOSPITAL_COMMUNITY): Payer: Self-pay | Admitting: Oncology

## 2016-11-22 DIAGNOSIS — E876 Hypokalemia: Secondary | ICD-10-CM

## 2016-12-13 ENCOUNTER — Other Ambulatory Visit (HOSPITAL_COMMUNITY): Payer: Self-pay | Admitting: Oncology

## 2016-12-13 DIAGNOSIS — C50912 Malignant neoplasm of unspecified site of left female breast: Secondary | ICD-10-CM

## 2016-12-18 ENCOUNTER — Encounter (HOSPITAL_COMMUNITY): Payer: Self-pay | Admitting: Adult Health

## 2016-12-18 NOTE — Progress Notes (Signed)
Received refill request from pharmacy for Clonazepam.   [place] Controlled Substance Reporting System reviewed and refill is appropriate on or after 12/18/16. Paper prescription printed & post-dated. Will ask nursing to fax to patient's pharmacy.   [place] reviewed:     Mike Craze, NP [place] [phone]

## 2016-12-21 ENCOUNTER — Other Ambulatory Visit: Payer: Self-pay | Admitting: Orthopedic Surgery

## 2016-12-21 DIAGNOSIS — M546 Pain in thoracic spine: Secondary | ICD-10-CM

## 2017-01-01 ENCOUNTER — Other Ambulatory Visit (HOSPITAL_COMMUNITY): Payer: Self-pay | Admitting: Nurse Practitioner

## 2017-01-01 DIAGNOSIS — R0602 Shortness of breath: Secondary | ICD-10-CM

## 2017-01-01 DIAGNOSIS — R634 Abnormal weight loss: Secondary | ICD-10-CM

## 2017-01-01 DIAGNOSIS — R1010 Upper abdominal pain, unspecified: Secondary | ICD-10-CM

## 2017-01-01 DIAGNOSIS — R918 Other nonspecific abnormal finding of lung field: Secondary | ICD-10-CM

## 2017-01-04 ENCOUNTER — Ambulatory Visit
Admission: RE | Admit: 2017-01-04 | Discharge: 2017-01-04 | Disposition: A | Discharge status: Home / Self Care | Payer: Medicare Other | Source: Ambulatory Visit | Attending: Orthopedic Surgery | Admitting: Orthopedic Surgery

## 2017-01-04 DIAGNOSIS — M546 Pain in thoracic spine: Secondary | ICD-10-CM

## 2017-01-07 ENCOUNTER — Other Ambulatory Visit (HOSPITAL_COMMUNITY): Payer: Self-pay | Admitting: Adult Health

## 2017-01-07 DIAGNOSIS — E876 Hypokalemia: Secondary | ICD-10-CM

## 2017-01-09 ENCOUNTER — Ambulatory Visit (HOSPITAL_COMMUNITY): Payer: Medicare Other

## 2017-01-21 ENCOUNTER — Ambulatory Visit (HOSPITAL_COMMUNITY)
Admission: RE | Admit: 2017-01-21 | Discharge: 2017-01-21 | Disposition: A | Discharge status: Home / Self Care | Payer: Medicare Other | Source: Ambulatory Visit | Attending: Nurse Practitioner | Admitting: Nurse Practitioner

## 2017-01-21 DIAGNOSIS — K861 Other chronic pancreatitis: Secondary | ICD-10-CM | POA: Diagnosis not present

## 2017-01-21 DIAGNOSIS — R0602 Shortness of breath: Secondary | ICD-10-CM

## 2017-01-21 DIAGNOSIS — I712 Thoracic aortic aneurysm, without rupture: Secondary | ICD-10-CM | POA: Insufficient documentation

## 2017-01-21 DIAGNOSIS — R918 Other nonspecific abnormal finding of lung field: Secondary | ICD-10-CM | POA: Insufficient documentation

## 2017-01-21 DIAGNOSIS — R634 Abnormal weight loss: Secondary | ICD-10-CM | POA: Insufficient documentation

## 2017-01-21 DIAGNOSIS — K7689 Other specified diseases of liver: Secondary | ICD-10-CM | POA: Diagnosis not present

## 2017-01-21 DIAGNOSIS — I2510 Atherosclerotic heart disease of native coronary artery without angina pectoris: Secondary | ICD-10-CM | POA: Diagnosis not present

## 2017-01-21 DIAGNOSIS — J439 Emphysema, unspecified: Secondary | ICD-10-CM | POA: Insufficient documentation

## 2017-01-21 DIAGNOSIS — R1010 Upper abdominal pain, unspecified: Secondary | ICD-10-CM | POA: Insufficient documentation

## 2017-01-21 DIAGNOSIS — I700 Atherosclerosis of aorta: Secondary | ICD-10-CM | POA: Insufficient documentation

## 2017-01-21 MED ORDER — IOPAMIDOL (ISOVUE-300) INJECTION 61%
100.0000 mL | Freq: Once | INTRAVENOUS | Status: AC | PRN
  Administered 2017-01-21: 80 mL via INTRAVENOUS

## 2017-01-31 ENCOUNTER — Other Ambulatory Visit (HOSPITAL_COMMUNITY): Payer: Self-pay | Admitting: Oncology

## 2017-02-07 ENCOUNTER — Encounter (INDEPENDENT_AMBULATORY_CARE_PROVIDER_SITE_OTHER): Payer: Self-pay | Admitting: Internal Medicine

## 2017-02-11 ENCOUNTER — Other Ambulatory Visit (HOSPITAL_COMMUNITY): Payer: Self-pay | Admitting: Oncology

## 2017-02-15 ENCOUNTER — Encounter (HOSPITAL_COMMUNITY): Payer: Self-pay | Admitting: Oncology

## 2017-02-15 ENCOUNTER — Encounter (HOSPITAL_BASED_OUTPATIENT_CLINIC_OR_DEPARTMENT_OTHER): Payer: Medicare Other | Admitting: Oncology

## 2017-02-15 ENCOUNTER — Encounter (HOSPITAL_COMMUNITY): Payer: Medicare Other | Attending: Oncology

## 2017-02-15 VITALS — BP 105/66 | HR 51 | Temp 98.1°F | Resp 14 | Wt 104.2 lb

## 2017-02-15 DIAGNOSIS — Z9221 Personal history of antineoplastic chemotherapy: Secondary | ICD-10-CM | POA: Diagnosis not present

## 2017-02-15 DIAGNOSIS — R918 Other nonspecific abnormal finding of lung field: Secondary | ICD-10-CM

## 2017-02-15 DIAGNOSIS — J432 Centrilobular emphysema: Secondary | ICD-10-CM | POA: Insufficient documentation

## 2017-02-15 DIAGNOSIS — D510 Vitamin B12 deficiency anemia due to intrinsic factor deficiency: Secondary | ICD-10-CM | POA: Insufficient documentation

## 2017-02-15 DIAGNOSIS — K3189 Other diseases of stomach and duodenum: Secondary | ICD-10-CM

## 2017-02-15 DIAGNOSIS — Z23 Encounter for immunization: Secondary | ICD-10-CM | POA: Diagnosis not present

## 2017-02-15 DIAGNOSIS — K639 Disease of intestine, unspecified: Secondary | ICD-10-CM

## 2017-02-15 DIAGNOSIS — D539 Nutritional anemia, unspecified: Secondary | ICD-10-CM

## 2017-02-15 DIAGNOSIS — D473 Essential (hemorrhagic) thrombocythemia: Secondary | ICD-10-CM

## 2017-02-15 DIAGNOSIS — Z9012 Acquired absence of left breast and nipple: Secondary | ICD-10-CM | POA: Diagnosis not present

## 2017-02-15 DIAGNOSIS — N183 Chronic kidney disease, stage 3 unspecified: Secondary | ICD-10-CM

## 2017-02-15 DIAGNOSIS — R634 Abnormal weight loss: Secondary | ICD-10-CM | POA: Diagnosis not present

## 2017-02-15 DIAGNOSIS — E876 Hypokalemia: Secondary | ICD-10-CM

## 2017-02-15 DIAGNOSIS — I712 Thoracic aortic aneurysm, without rupture: Secondary | ICD-10-CM | POA: Diagnosis not present

## 2017-02-15 DIAGNOSIS — Z853 Personal history of malignant neoplasm of breast: Secondary | ICD-10-CM | POA: Diagnosis not present

## 2017-02-15 DIAGNOSIS — R109 Unspecified abdominal pain: Secondary | ICD-10-CM

## 2017-02-15 DIAGNOSIS — I130 Hypertensive heart and chronic kidney disease with heart failure and stage 1 through stage 4 chronic kidney disease, or unspecified chronic kidney disease: Secondary | ICD-10-CM | POA: Insufficient documentation

## 2017-02-15 DIAGNOSIS — Z1371 Encounter for nonprocreative screening for genetic disease carrier status: Secondary | ICD-10-CM

## 2017-02-15 DIAGNOSIS — I2510 Atherosclerotic heart disease of native coronary artery without angina pectoris: Secondary | ICD-10-CM | POA: Diagnosis not present

## 2017-02-15 DIAGNOSIS — I509 Heart failure, unspecified: Secondary | ICD-10-CM | POA: Diagnosis not present

## 2017-02-15 DIAGNOSIS — C4372 Malignant melanoma of left lower limb, including hip: Secondary | ICD-10-CM

## 2017-02-15 DIAGNOSIS — C50912 Malignant neoplasm of unspecified site of left female breast: Secondary | ICD-10-CM

## 2017-02-15 DIAGNOSIS — E7800 Pure hypercholesterolemia, unspecified: Secondary | ICD-10-CM | POA: Diagnosis not present

## 2017-02-15 DIAGNOSIS — K769 Liver disease, unspecified: Secondary | ICD-10-CM | POA: Diagnosis not present

## 2017-02-15 DIAGNOSIS — J431 Panlobular emphysema: Secondary | ICD-10-CM

## 2017-02-15 DIAGNOSIS — F17210 Nicotine dependence, cigarettes, uncomplicated: Secondary | ICD-10-CM | POA: Diagnosis not present

## 2017-02-15 DIAGNOSIS — Z85820 Personal history of malignant melanoma of skin: Secondary | ICD-10-CM | POA: Insufficient documentation

## 2017-02-15 DIAGNOSIS — K5900 Constipation, unspecified: Secondary | ICD-10-CM

## 2017-02-15 DIAGNOSIS — K219 Gastro-esophageal reflux disease without esophagitis: Secondary | ICD-10-CM | POA: Insufficient documentation

## 2017-02-15 LAB — URINALYSIS, ROUTINE W REFLEX MICROSCOPIC
Bilirubin Urine: NEGATIVE
GLUCOSE, UA: NEGATIVE mg/dL
Hgb urine dipstick: NEGATIVE
KETONES UR: NEGATIVE mg/dL
LEUKOCYTES UA: NEGATIVE
Nitrite: NEGATIVE
PH: 5 (ref 5.0–8.0)
Protein, ur: NEGATIVE mg/dL
SPECIFIC GRAVITY, URINE: 1.016 (ref 1.005–1.030)

## 2017-02-15 LAB — COMPREHENSIVE METABOLIC PANEL
ALK PHOS: 57 U/L (ref 38–126)
ALT: 11 U/L — AB (ref 14–54)
AST: 27 U/L (ref 15–41)
Albumin: 4 g/dL (ref 3.5–5.0)
Anion gap: 10 (ref 5–15)
BUN: 18 mg/dL (ref 6–20)
CALCIUM: 9.2 mg/dL (ref 8.9–10.3)
CO2: 25 mmol/L (ref 22–32)
CREATININE: 1.39 mg/dL — AB (ref 0.44–1.00)
Chloride: 104 mmol/L (ref 101–111)
GFR, EST AFRICAN AMERICAN: 44 mL/min — AB (ref >60)
GFR, EST NON AFRICAN AMERICAN: 38 mL/min — AB (ref >60)
Glucose, Bld: 111 mg/dL — ABNORMAL HIGH (ref 65–99)
Potassium: 3.7 mmol/L (ref 3.5–5.1)
SODIUM: 139 mmol/L (ref 135–145)
Total Bilirubin: 0.7 mg/dL (ref 0.3–1.2)
Total Protein: 7 g/dL (ref 6.5–8.1)

## 2017-02-15 LAB — CBC WITH DIFFERENTIAL/PLATELET
Basophils Absolute: 0 10*3/uL (ref 0.0–0.1)
Basophils Relative: 0 %
EOS ABS: 0.1 10*3/uL (ref 0.0–0.7)
EOS PCT: 3 %
HCT: 23.4 % — ABNORMAL LOW (ref 36.0–46.0)
HEMOGLOBIN: 7.9 g/dL — AB (ref 12.0–15.0)
LYMPHS ABS: 1.7 10*3/uL (ref 0.7–4.0)
LYMPHS PCT: 34 %
MCH: 40.9 pg — AB (ref 26.0–34.0)
MCHC: 33.8 g/dL (ref 30.0–36.0)
MCV: 121.2 fL — AB (ref 78.0–100.0)
MONOS PCT: 6 %
Monocytes Absolute: 0.3 10*3/uL (ref 0.1–1.0)
Neutro Abs: 2.9 10*3/uL (ref 1.7–7.7)
Neutrophils Relative %: 57 %
PLATELETS: 368 10*3/uL (ref 150–400)
RBC: 1.93 MIL/uL — ABNORMAL LOW (ref 3.87–5.11)
RDW: 17.4 % — ABNORMAL HIGH (ref 11.5–15.5)
WBC: 5 10*3/uL (ref 4.0–10.5)

## 2017-02-15 LAB — RETICULOCYTES
RBC.: 2.03 MIL/uL — ABNORMAL LOW (ref 3.87–5.11)
Retic Count, Absolute: 34.5 10*3/uL (ref 19.0–186.0)
Retic Ct Pct: 1.7 % (ref 0.4–3.1)

## 2017-02-15 LAB — SEDIMENTATION RATE: Sed Rate: 68 mm/hr — ABNORMAL HIGH (ref 0–22)

## 2017-02-15 LAB — LACTATE DEHYDROGENASE: LDH: 185 U/L (ref 98–192)

## 2017-02-15 LAB — FOLATE: Folate: 14.3 ng/mL (ref >5.9)

## 2017-02-15 MED ORDER — INFLUENZA VAC SPLIT QUAD 0.5 ML IM SUSY
PREFILLED_SYRINGE | INTRAMUSCULAR | Status: AC
  Filled 2017-02-15: qty 0.5

## 2017-02-15 MED ORDER — INFLUENZA VAC SPLIT QUAD 0.5 ML IM SUSY
0.5000 mL | PREFILLED_SYRINGE | Freq: Once | INTRAMUSCULAR | Status: AC
  Administered 2017-02-15: 0.5 mL via INTRAMUSCULAR

## 2017-02-15 MED ORDER — POTASSIUM CHLORIDE CRYS ER 20 MEQ PO TBCR: 20.0000 meq | EXTENDED_RELEASE_TABLET | Freq: Three times a day (TID) | ORAL | 5 refills | Status: DC

## 2017-02-15 NOTE — Assessment & Plan Note (Signed)
Pernicious anemia, requiring B12 replacement.  On IM B12 injections, 1000 mcg, monthly- administered at home.

## 2017-02-15 NOTE — Patient Instructions (Signed)
[place] at [facility] Discharge Instructions  RECOMMENDATIONS MADE BY THE CONSULTANT AND ANY TEST RESULTS WILL BE SENT TO YOUR REFERRING PHYSICIAN.  Additional blood work needed today for worsening anemia. HOLD HYDREA Continue B12 injection monthly. GI consult as scheduled on 02/18/2017. Stool cards are ordered to rule out occult blood loss from GI tract. Labs in 2 weeks. CT scans of chest, abdomen, pelvis reviewed. Return in 2 weeks for follow-up.  Thank you for choosing [place] at [facility] to provide your oncology and hematology care.  To afford each patient quality time with our provider, please arrive at least 15 minutes before your scheduled appointment time.    If you have a lab appointment with the [facility] please come in thru the  Main Entrance and check in at the main information desk  You need to re-schedule your appointment should you arrive 10 or more minutes late.  We strive to give you quality time with our providers, and arriving late affects you and other patients whose appointments are after yours.  Also, if you no show three or more times for appointments you may be dismissed from the clinic at the providers discretion.     Again, thank you for choosing [facility].  Our hope is that these requests will decrease the amount of time that you wait before being seen by our physicians.       _____________________________________________________________  Should you have questions after your visit to [facility], please contact our office at [phone] between the hours of 8:30 a.m. and 4:30 p.m.  Voicemails left after 4:30 p.m. will not be returned until the following business day.  For prescription refill requests, have your pharmacy contact our office.       Resources For Cancer Patients and their Caregivers ? American Cancer Society: Can assist with transportation, wigs, general  needs, runs Look Good Feel Better.        [phone] ? Cancer Care: Provides financial assistance, online support groups, medication/co-pay assistance.  1-800-813-HOPE (859)277-3984) ? [place] Assists [place] cancer patients and their families through emotional , educational and financial support.  202-031-1908 ? [place] DSS Where to apply for food stamps, Medicaid and utility assistance. 6823831678 ? [facility]: Transportation to medical appointments. (650)768-6267 ? Social Security Administration: May apply for disability if have a Stage IV cancer. 8173972537 786-292-2099 ? [employer], Disability and Transit Services: Assists with nutrition, care and transit needs. [place] Support Programs: @10RELATIVEDAYS @ > Cancer Support Group  2nd Tuesday of the month 1pm-2pm, Journey Room  > Creative Journey  3rd Tuesday of the month 1130am-1pm, Journey Room  > Look Good Feel Better  1st Wednesday of the month 10am-12 noon, Journey Room (Call [place] to register [phone])

## 2017-02-15 NOTE — Assessment & Plan Note (Signed)
Melanoma of L upper/thigh buttocks.  S/P excision of lesion with clear margins identifying a Breslow depth of 1.02 mm, pT2A

## 2017-02-15 NOTE — Progress Notes (Signed)
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)  BRCA negative  Panlobular emphysema (HCC)  Multiple pulmonary nodules  Hepatic lesion  Gastric wall thickening  Colon wall thickening  Weight loss  Chronic renal disease, stage III (HCC)  Macrocytic anemia - Plan: Vitamin B12, Folate, Iron and TIBC, Ferritin, Haptoglobin, Lactate dehydrogenase, Methylmalonic acid, serum, Reticulocytes, Homocysteine, serum, Sedimentation rate, Pathologist smear review, Occult blood card to lab, stool, Occult blood card to lab, stool, Occult blood card to lab, stool, Vitamin B12, Folate, Iron and TIBC, Ferritin, Haptoglobin, Lactate dehydrogenase, Methylmalonic acid, serum, Reticulocytes, Homocysteine, serum, Sedimentation rate, Pathologist smear review  Hypokalemia - Plan: potassium chloride SA (K-DUR,KLOR-CON) 20 MEQ tablet  Left flank pain - Plan: Urinalysis, Routine w reflex microscopic, Urine culture  CURRENT THERAPY: Hydrea 500 mg Mon + Tues and 1000 mg on Wed-Sun.  B12 injections monthly at home  INTERVAL HISTORY: Ashley [name] 66 y.o. female returns for followup of essential thrombocytosis, pernicious anemia, H/O left breast cancer, history of melanoma.  She is tolerating Hydrea well and with monthly B12 injections at home.  She denies any blood in her stools or black stools.  She denies any gross hematuria.  She denies any hemoptysis.  She denies any abnormal bleeding or bruising.  She continues to suffer from ongoing weight loss.  CT imaging was performed in October 2018 and was negative for any obvious evidence of malignancy but there were some nonspecific findings that were required GI workup/follow-up.  She admits to early satiety.   This results in decrease in appetite.  She denies any vomiting.  She does admit to nausea.  She reports left flank pain without urinary frequency, burning, pain or suprapubic pain.  She denies any fevers or chills.  She also reports constipation.  She is utilizing MiraLAX at home but not on a daily basis.  She denies any shortness of breath or dyspnea on exertion.  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 The 3 liver lesions are not hypermetabolic. The radiologist suspects they may be subtly present on prior CT chest from 10/2013, to further reassuring that these are likely benign lesions.       Melanoma ([place])   07/09/2013 Initial Biopsy    Initial biopsy L upper thigh/buttocks melanoma      07/20/2013 Surgery    Excision L lateral buttock/upper thigh melanoma,  clear margins      07/20/2013 Pathology Results    Breslow depth 1.02 mm, pT2a         Review of Systems  Constitutional: Positive for malaise/fatigue and weight loss. Negative for chills and fever.  HENT: Negative.  Negative for nosebleeds.   Eyes: Negative.   Respiratory: Negative.  Negative for cough and hemoptysis.   Cardiovascular: Negative.  Negative for chest pain.  Gastrointestinal:  Positive for constipation and nausea. Negative for blood in stool, diarrhea, melena and vomiting.  Genitourinary: Positive for flank pain. Negative for dysuria, frequency, hematuria and urgency.  Skin: Negative.   Neurological: Negative.  Negative for weakness.  Endo/Heme/Allergies: Negative.  Does not bruise/bleed easily.  Psychiatric/Behavioral: Negative.     Past Medical History:  Diagnosis Date  . 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 Arthritis   . Ascending aortic aneurysm ([place])   . Cancer [facility]) 1995   breast, left, mastectomy/chemo  . Chest pain    Possibly cardiac. No evidence of ischemia/injury based upon normal troponin I. Chest discomfort could be tachycardia induced supply demand mismatch.   . CHF (congestive heart failure) ([place]) 11/17/2015   after surgery   . Colon adenomas   . Coronary artery disease   . Dyspnea    with exertion  . Emphysema of lung ([place]) 11/17/2015  . Essential hypertension, benign   . GERD (gastroesophageal reflux disease)   . Hyperlipidemia   . Hypertension   . Melanoma ([place]) 01/09/2016  . Palpitations   . Pernicious anemia 03/06/2016  . Pure hypercholesterolemia   . Thrombocythemia, essential ([place]) 01/09/2016  . Thrombocytosis ([place])    Idiopathic    Past Surgical History:  Procedure Laterality Date  . ANTERIOR AND POSTERIOR REPAIR N/A 12/09/2014   Procedure: ANTERIOR (CYSTOCELE) AND POSTERIOR REPAIR (RECTOCELE);  Surgeon: Bjorn Loser, MD;  Location: [facility];  Service: Urology;  Laterality: N/A;  . ANTERIOR CERVICAL DECOMPRESSION/DISCECTOMY FUSION 4 LEVELS Right 10/03/2016   Procedure: ANTERIOR CERVICAL DECOMPRESSION FUSION, CERVICAL 4-5, CERVICAL 5-6, CERVICAL 6-7, CERVICAL 7 TO THORACIC 1 WITH INSTRUMENTATION AND ALLOGRAFT;  Surgeon: Phylliss Bob, MD;  Location: [facility];  Service: Orthopedics;  Laterality: Right;  ANTERIOR CERVICAL DECOMPRESSION FUSION, CERVICAL 4-5, CERVICAL 5-6, CERVICAL 6-7, CERVICAL 7 TO THORACIC 1 WITH  INSTRUMENTATION AND ALLOGRAFT; REQUEST 4 HO  . ANTERIOR LAT LUMBAR FUSION Left 11/16/2015   Procedure: LEFT SIDED LATERAL INTERBODY FUSION, LUMBAR 2-3, LUMBAR 3-4, LUMBAR 4-5 WITH INSTRUMENTATION;  Surgeon: Phylliss Bob, MD;  Location: [facility];  Service: Orthopedics;  Laterality: Left;  LEFT SIDED LATEARL INTERBODY FUSION, LUMBAR 2-3, LUMBAR 3-4, LUMBAR 4-5 WITH INSTRUMENTATION   . APPENDECTOMY    . BONE MARROW ASPIRATION  07/2012  . BONE MARROW BIOPSY  07/2012  . BREAST SURGERY    . CARDIAC CATHETERIZATION    . CARDIAC CATHETERIZATION N/A 01/20/2016   Procedure: Left Heart Cath and Coronary Angiography;  Surgeon: Burnell Blanks, MD;  Location: [facility];  Service: Cardiovascular;  Laterality: N/A;  . COLONOSCOPY  11/29/2010   Procedure: COLONOSCOPY;  Surgeon: Rogene Houston, MD;  [facility];  Service: Endoscopy;  Laterality: N/A;  . COLONOSCOPY N/A 02/18/2014   Procedure: COLONOSCOPY;  Surgeon: Rogene Houston, MD;  [facility];  Service: Endoscopy;  Laterality: N/A;  1030  . CYSTOSCOPY N/A 12/09/2014   Procedure: CYSTOSCOPY;  Surgeon: Bjorn Loser, MD;  Location: [facility];  Service: Urology;  Laterality: N/A;  . IR GENERIC HISTORICAL  01/11/2016  IR RADIOLOGY PERIPHERAL GUIDED IV START 01/11/2016 Saverio Danker, PA-C [facility]  . IR GENERIC HISTORICAL  01/11/2016   IR US GUIDE VASC ACCESS RIGHT 01/11/2016 Saverio Danker, PA-C [facility]  . MASTECTOMY     left  . OVARIAN CYST SURGERY     x2  . SALPINGOOPHORECTOMY Bilateral 12/09/2014   Procedure: SALPINGO OOPHORECTOMY;  Surgeon: Servando Salina, MD;  Location: [facility];  Service: Gynecology;  Laterality: Bilateral;  . TUBAL LIGATION    . VAGINAL HYSTERECTOMY N/A 12/09/2014   Procedure: HYSTERECTOMY VAGINAL;  Surgeon: Servando Salina, MD;  Location: [facility];  Service: Gynecology;  Laterality: N/A;    Family History  Problem Relation Age of Onset  . Hypertension Mother   . Heart failure Mother     . Congestive Heart Failure Mother   . COPD Mother   . Pernicious anemia Mother   . Cancer Mother        lung  . Hypertension Father   . CAD Father   . Heart attack Father   . Hypertension Sister   . Cancer Other   . Celiac disease Other     Social History   Social History  . Marital status: Divorced    Spouse name: N/A  . Number of children: 2  . Years of education: N/A   Social History Main Topics  . Smoking status: Current Every Day Smoker    Packs/day: 0.50    Years: 50.00    Types: Cigarettes  . Smokeless tobacco: Never Used  . Alcohol use No  . Drug use: No  . Sexual activity: Yes    Birth control/ protection: Post-menopausal   Other Topics Concern  . None   Social History Narrative  . None     PHYSICAL EXAMINATION  ECOG PERFORMANCE STATUS: 1 - Symptomatic but completely ambulatory  Vitals:   02/15/17 1453  BP: 105/66  Pulse: (!) 51  Resp: 14  Temp: 98.1 F (36.7 C)  SpO2: 100%    GENERAL:alert, no distress, cachectic, comfortable, cooperative, smiling and accompanied by daughter. SKIN: skin color, texture, turgor are normal, no rashes or significant lesions HEAD: Normocephalic, No masses, lesions, tenderness or abnormalities EYES: normal, EOMI, Conjunctiva are pink and non-injected EARS: External ears normal OROPHARYNX:mucous membranes are moist  NECK: supple, trachea midline LYMPH:  not examined BREAST:not examined LUNGS: clear to auscultation , decreased breath sounds HEART: regular rate & rhythm, no murmurs and no gallops ABDOMEN:abdomen soft and normal bowel sounds BACK: Back symmetric, no curvature. EXTREMITIES:less then 2 second capillary refill, no joint deformities, effusion, or inflammation, no skin discoloration, no cyanosis  NEURO: alert & oriented x 3 with fluent speech, no focal motor/sensory deficits, gait normal   LABORATORY DATA: CBC    Component Value Date/Time   WBC 5.0 02/15/2017 1305   RBC 1.93 (L) 02/15/2017 1305    HGB 7.9 (L) 02/15/2017 1305   HCT 23.4 (L) 02/15/2017 1305   PLT 368 02/15/2017 1305   MCV 121.2 (H) 02/15/2017 1305   MCH 40.9 (H) 02/15/2017 1305   MCHC 33.8 02/15/2017 1305   RDW 17.4 (H) 02/15/2017 1305   LYMPHSABS 1.7 02/15/2017 1305   MONOABS 0.3 02/15/2017 1305   EOSABS 0.1 02/15/2017 1305   BASOSABS 0.0 02/15/2017 1305      Chemistry      Component Value Date/Time   NA 139 02/15/2017 1305   K 3.7 02/15/2017 1305   CL 104 02/15/2017 1305   CO2 25 02/15/2017 1305   BUN  18 02/15/2017 1305   CREATININE 1.39 (H) 02/15/2017 1305      Component Value Date/Time   CALCIUM 9.2 02/15/2017 1305   ALKPHOS 57 02/15/2017 1305   AST 27 02/15/2017 1305   ALT 11 (L) 02/15/2017 1305   BILITOT 0.7 02/15/2017 1305        PENDING LABS:   RADIOGRAPHIC STUDIES:  Ct Chest W Contrast  Result Date: 01/21/2017 CLINICAL DATA:  Shortness of breath and 37 pounds weight loss in 1 year. Postprandial left upper quadrant pain for 6 months. History of treated left breast cancer, post mastectomy and chemotherapy in 1995. EXAM: CT CHEST, ABDOMEN, AND PELVIS WITH CONTRAST TECHNIQUE: Multidetector CT imaging of the chest, abdomen and pelvis was performed following the standard protocol during bolus administration of intravenous contrast. CONTRAST:  67m ISOVUE-300 IOPAMIDOL (ISOVUE-300) INJECTION 61% COMPARISON:  Body CT 05/10/2016, PET-CT 05/25/2016 FINDINGS: CT CHEST FINDINGS Cardiovascular: Fusiform dilation of the ascending aorta measuring 4.4 cm in maximum diameter. Common origin of the innominate artery and left common carotid artery. Normal heart size. No pericardial effusion. Calcific atherosclerotic disease of the coronary arteries and thoracic aorta. Mediastinum/Nodes: No enlarged mediastinal, hilar, or axillary lymph nodes. Thyroid gland, trachea, and esophagus demonstrate no significant findings. Lungs/Pleura: Moderate in severity upper lobe predominant centrilobular and paraseptal emphysema.  Chronic interstitial lung changes in bilateral lower lobes. Stable 8 mm perifissural nodule in the right lower lobe, image 87/152, sequence 5. 8 mm perifissural nodule in the left lower lobe, image 73/152, sequence 5. Musculoskeletal: No chest wall mass or suspicious bone lesions identified. Post left mastectomy and subpectoral implant reconstruction, intact. Lower cervical spinal fusion. CT ABDOMEN PELVIS FINDINGS Hepatobiliary: 2 irregular ill-defined hypoattenuated lesions in the right lobe of the liver measure 1.2 cm each, image 56 and 55/119, sequence 2. More well-defined 8 mm lesion in the dome of the liver, image 50/119, sequence 2. Pancreas: Scattered coarse calcifications within the head and tail of the pancreas. Mild prominence of the main pancreatic duct, which measures 3.5 mm proximally. Spleen: Normal in size without focal abnormality. Adrenals/Urinary Tract: Stable right adrenal lesion, determined to represent an adenoma. Stable nodularity of the left adrenal gland. 1.9 cm circumscribed low-density lesion in the lower pole of the left kidney. 1.7 cm circumscribed low-density lesion, exophytic off of the upper pole of the left kidney. 1.1 mm low-density circumscribed lesion within the upper pole of the right kidney. Several other bilateral too small to be accurately characterize low-density lesions. Stomach/Bowel: Circumferential thickening of the gastric cardia. No evidence of small bowel wall thickening, distention, or inflammatory changes. Asymmetric mucosal thickening of the ascending colon, slightly upstream to the ileocecal valve, image 80/119, sequence 2 Vascular/Lymphatic: Aortic atherosclerosis. No enlarged abdominal or pelvic lymph nodes. Sub pathologic by CT criteria retroperitoneal lymph nodes in the upper abdomen at the periaortic/ periaortic nodal station at the level of the celiac artery, unchanged. Reproductive: Status post hysterectomy. No adnexal masses. Other: No abdominal wall hernia  or abnormality. No abdominopelvic ascites. Musculoskeletal: Stable postsurgical changes of L2/L5 spinal fusion. Lower lumbosacral spine facet arthropathy. IMPRESSION: 1. Three indeterminate liver lesions, with relatively stable CT appearance, measuring up to 1.2 cm. These did not demonstrate increased metabolic activity on prior PET-CT and therefore were determined to likely be benign on PET-CT 05/25/2016. 2. Circumferential mucosal thickening of the gastric cardia. Asymmetric mucosal thickening of the ascending colon, slightly upstream to the ileocecal valve. Both of these findings are nonspecific, but underlying malignancy cannot be excluded. Please correlate to colonoscopy  and upper endoscopy. 3. Stable findings of chronic pancreatitis. 4. Stable sub pathologic by CT criteria upper abdominal retroperitoneal lymph nodes. 5. Tortuosity and calcific atherosclerotic disease of the aorta. 6. Stable in appearance bilateral pulmonary nodules, which did not demonstrate increased metabolic activity on prior PET-CT, favoring benign etiology. 7. Calcific atherosclerotic disease of the coronary arteries. 8. Fusiform aneurysmal dilation of the ascending aorta measuring 4.4 cm in maximum diameter. Recommend annual imaging followup by CTA or MRA. This recommendation follows 2010 ACCF/AHA/AATS/ACR/ASA/SCA/SCAI/SIR/STS/SVM Guidelines for the Diagnosis and Management of Patients with Thoracic Aortic Disease. Circulation. 2010; 121: e266-e369 9. Moderate in severity emphysema. Electronically Signed   By: Fidela Salisbury M.D.   On: 01/21/2017 14:33   Ct Abdomen Pelvis W Contrast  Result Date: 01/21/2017 CLINICAL DATA:  Shortness of breath and 37 pounds weight loss in 1 year. Postprandial left upper quadrant pain for 6 months. History of treated left breast cancer, post mastectomy and chemotherapy in 1995. EXAM: CT CHEST, ABDOMEN, AND PELVIS WITH CONTRAST TECHNIQUE: Multidetector CT imaging of the chest, abdomen and pelvis  was performed following the standard protocol during bolus administration of intravenous contrast. CONTRAST:  33m ISOVUE-300 IOPAMIDOL (ISOVUE-300) INJECTION 61% COMPARISON:  Body CT 05/10/2016, PET-CT 05/25/2016 FINDINGS: CT CHEST FINDINGS Cardiovascular: Fusiform dilation of the ascending aorta measuring 4.4 cm in maximum diameter. Common origin of the innominate artery and left common carotid artery. Normal heart size. No pericardial effusion. Calcific atherosclerotic disease of the coronary arteries and thoracic aorta. Mediastinum/Nodes: No enlarged mediastinal, hilar, or axillary lymph nodes. Thyroid gland, trachea, and esophagus demonstrate no significant findings. Lungs/Pleura: Moderate in severity upper lobe predominant centrilobular and paraseptal emphysema. Chronic interstitial lung changes in bilateral lower lobes. Stable 8 mm perifissural nodule in the right lower lobe, image 87/152, sequence 5. 8 mm perifissural nodule in the left lower lobe, image 73/152, sequence 5. Musculoskeletal: No chest wall mass or suspicious bone lesions identified. Post left mastectomy and subpectoral implant reconstruction, intact. Lower cervical spinal fusion. CT ABDOMEN PELVIS FINDINGS Hepatobiliary: 2 irregular ill-defined hypoattenuated lesions in the right lobe of the liver measure 1.2 cm each, image 56 and 55/119, sequence 2. More well-defined 8 mm lesion in the dome of the liver, image 50/119, sequence 2. Pancreas: Scattered coarse calcifications within the head and tail of the pancreas. Mild prominence of the main pancreatic duct, which measures 3.5 mm proximally. Spleen: Normal in size without focal abnormality. Adrenals/Urinary Tract: Stable right adrenal lesion, determined to represent an adenoma. Stable nodularity of the left adrenal gland. 1.9 cm circumscribed low-density lesion in the lower pole of the left kidney. 1.7 cm circumscribed low-density lesion, exophytic off of the upper pole of the left kidney. 1.1  mm low-density circumscribed lesion within the upper pole of the right kidney. Several other bilateral too small to be accurately characterize low-density lesions. Stomach/Bowel: Circumferential thickening of the gastric cardia. No evidence of small bowel wall thickening, distention, or inflammatory changes. Asymmetric mucosal thickening of the ascending colon, slightly upstream to the ileocecal valve, image 80/119, sequence 2 Vascular/Lymphatic: Aortic atherosclerosis. No enlarged abdominal or pelvic lymph nodes. Sub pathologic by CT criteria retroperitoneal lymph nodes in the upper abdomen at the periaortic/ periaortic nodal station at the level of the celiac artery, unchanged. Reproductive: Status post hysterectomy. No adnexal masses. Other: No abdominal wall hernia or abnormality. No abdominopelvic ascites. Musculoskeletal: Stable postsurgical changes of L2/L5 spinal fusion. Lower lumbosacral spine facet arthropathy. IMPRESSION: 1. Three indeterminate liver lesions, with relatively stable CT appearance, measuring up to  1.2 cm. These did not demonstrate increased metabolic activity on prior PET-CT and therefore were determined to likely be benign on PET-CT 05/25/2016. 2. Circumferential mucosal thickening of the gastric cardia. Asymmetric mucosal thickening of the ascending colon, slightly upstream to the ileocecal valve. Both of these findings are nonspecific, but underlying malignancy cannot be excluded. Please correlate to colonoscopy and upper endoscopy. 3. Stable findings of chronic pancreatitis. 4. Stable sub pathologic by CT criteria upper abdominal retroperitoneal lymph nodes. 5. Tortuosity and calcific atherosclerotic disease of the aorta. 6. Stable in appearance bilateral pulmonary nodules, which did not demonstrate increased metabolic activity on prior PET-CT, favoring benign etiology. 7. Calcific atherosclerotic disease of the coronary arteries. 8. Fusiform aneurysmal dilation of the ascending aorta  measuring 4.4 cm in maximum diameter. Recommend annual imaging followup by CTA or MRA. This recommendation follows 2010 ACCF/AHA/AATS/ACR/ASA/SCA/SCAI/SIR/STS/SVM Guidelines for the Diagnosis and Management of Patients with Thoracic Aortic Disease. Circulation. 2010; 121: e266-e369 9. Moderate in severity emphysema. Electronically Signed   By: Fidela Salisbury M.D.   On: 01/21/2017 14:33     PATHOLOGY:    ASSESSMENT AND PLAN:  Thrombocythemia, essential (HCC) Essential thrombocytosis, on Hydrea, well controlled.   Labs today: CBC diff, CMET.  I personally reviewed and went over laboratory results with the patient.  The results are noted within this dictation.  Platelets and WBC are at goal, but hemoglobin is below baseline at 7.9 g/dL.  Macrocytosis is secondary to Hydrea.  I will add an anemia panel, retic count, MMA, homocysteine, LDH, ESR, smear review, and haptoglobin to her labs today.  I will also order stool cards.  She may need to undergo a repeat venipuncture.  This drop in Hgb could be from [facility] and therefore, she will HOLD hydrea until return visit with repeat labs.  We will be interested in learning GI's input from anemia standpoint.  Influenza immunization administered today.  Labs in 2 weeks: CBC diff.  Return in 2 weeks for follow-up.  Pernicious anemia Pernicious anemia, requiring B12 replacement.  On IM B12 injections, 1000 mcg, monthly- administered at home.  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  S/P left modified radical mastectomy with axillary node dissection being positive with 1 left axillary lymph node after neoadjuvant AC every 3 weeks x 6 cycles ([place]).  Genetic testing was negative for BRCA.  I personally reviewed and went over radiographic studies with the patient.  The results are noted within this dictation.  I personally reviewed the images in PACS.  Mammogram in April 2018 was BIRADS 1.  Order is  placed for next mammogram in 06/2017.  Melanoma ([place]) Melanoma of L upper/thigh buttocks.  S/P excision of lesion with clear margins identifying a Breslow depth of 1.02 mm, pT2A  5. BRCA negative Noted  6. Panlobular emphysema ([place]) Secondary to tobacco abuse  7. Multiple pulmonary nodules Stable on most recent imaging in 01/2017- benign  8. Hepatic lesion Hepatic lesions x 3, stable and not hypermetabolic on PET scan in 08/6977.  Largest measuring 1.2 cm.  9. Gastric wall thickening Identified on CT imaging on 01/21/2017- circumferential.  GI appointment on 02/18/2017 with Deberah Castle, NP  10. Colon wall thickening Identified on CT imaging on 01/21/2017- ascending colon.  GI consult on 02/18/2017 with Deberah Castle, NP.  68. Weight loss Unintentional.  CT CAP completed on 01/21/2017 by primary care provider negative for any specific signs for malignancy.  Multiple abnormalities (as mentioned above) but nonspecific.  GI work-up is indicated to evaluate for weight loss and consider EGD/Colonoscoopy for thickened gastric cardia and ascending colon wall thickening.  Screening CEA testing is not indicated.  Screening AFP is not indicated.  12. Chronic renal disease, stage III Stable  13. Macrocytic anemia Macrocytosis is likely from Hydrea.  Anemia is below baseline.  I have added additional lab work today and stool cards x 3.    14. Left flank pain UA with reflex and urine culture ordered.  15. Constipation Using MiraLax 1-2 times per week.  I have recommended daily use and may increase to BID if needed.  I have encouraged increase H2O consumption daily as well.   Final Result of Complexity      Choose decision making level with 2 or 3 checks OR choose the decision making level on Section B       A Number of diagnoses or treatment options  _0   </= 1 Minimal  _1   2 Limited  _2   3 Multiple  _3   >/= 4 Extensive  B Amount and complexity of data  _4   </= 1 Minimal or low  _5    2 Limited  _6   3  Moderate  _7   >/= 4 Extensive  C Highest risk  _8   Minimal  _9   Low  _10   Moderate  _11   High   Type of decision making  _12   Straight-forward  _13   Low Complexity  _14   Moderate- Complexity  _15   High- Complexity     ORDERS PLACED FOR THIS ENCOUNTER: Orders Placed This Encounter  Procedures  . Urine culture  . MM DIAG BREAST TOMO UNI RIGHT  . Vitamin B12  . Folate  . Iron and TIBC  . Ferritin  . Haptoglobin  . Lactate dehydrogenase  . Methylmalonic acid, serum  . Reticulocytes  . Homocysteine, serum  . Sedimentation rate  . Pathologist smear review  . Occult blood card to lab, stool  . Occult blood card to lab, stool  . Occult blood card to lab, stool  . Urinalysis, Routine w reflex microscopic    MEDICATIONS PRESCRIBED THIS ENCOUNTER: Meds ordered this encounter  Medications  . Influenza vac split quadrivalent PF (FLUARIX) injection 0.5 mL  . furosemide (LASIX) 40 MG tablet    Sig: Take 80 mg by mouth daily.  . irbesartan (AVAPRO) 75 MG tablet    Sig: Take 1 tablet by mouth daily.  . meloxicam (MOBIC) 15 MG tablet    Sig: Take 15 mg by mouth daily.  Marland Kitchen esomeprazole (NEXIUM) 40 MG capsule    Sig: Take 40 mg by mouth daily.  . potassium chloride SA (K-DUR,KLOR-CON) 20 MEQ tablet    Sig: Take 1 tablet (20 mEq total) by mouth 3 (three) times daily.    Dispense:  90 tablet    Refill:  5    Order Specific Question:   Supervising Provider    Answer:   Brunetta Genera [[id number]]    THERAPY PLAN: HOLD HYDREA.  Labs in 2 weeks.  GI work-up for weight loss and recent CT imaging results is recommended.  All questions were answered. The patient knows to call the clinic with any problems, questions or concerns. We can certainly see the patient much sooner if necessary.  Patient and plan discussed with Dr. Twana First and she is in agreement with the aforementioned.   This note is electronically signed by: Doy Mince 02/15/2017 3:15  PM

## 2017-02-15 NOTE — Assessment & Plan Note (Addendum)
Essential thrombocytosis, on Hydrea, well controlled.   Labs today: CBC diff, CMET.  I personally reviewed and went over laboratory results with the patient.  The results are noted within this dictation.  Platelets and WBC are at goal, but hemoglobin is below baseline at 7.9 g/dL.  Macrocytosis is secondary to Hydrea.  I will add an anemia panel, retic count, MMA, homocysteine, LDH, ESR, smear review, and haptoglobin to her labs today.  I will also order stool cards.  She may need to undergo a repeat venipuncture.  This drop in Hgb could be from Hydrea and therefore, she will HOLD hydrea until return visit with repeat labs.  We will be interested in learning GI's input from anemia standpoint.  Influenza immunization administered today.  Labs in 2 weeks: CBC diff.  Return in 2 weeks for follow-up. 

## 2017-02-15 NOTE — Progress Notes (Addendum)
Ashley [name] presents today for injection per MD orders. Fluarix administered IM in right deltoid. Administration without incident. Patient tolerated well. Patient tolerated treatment without incidence. Patient discharged ambulatory and in stable condition from clinic. Patient to follow up as scheduled.

## 2017-02-15 NOTE — Assessment & Plan Note (Signed)
History of left breast cancer, Stage II.  S/P left modified radical mastectomy with axillary node dissection being positive with 1 left axillary lymph node after neoadjuvant AC every 3 weeks x 6 cycles ([place]).  Genetic testing was negative for BRCA.  I personally reviewed and went over radiographic studies with the patient.  The results are noted within this dictation.  I personally reviewed the images in PACS.  Mammogram in April 2018 was BIRADS 1.  Order is placed for next mammogram in 06/2017.

## 2017-02-16 LAB — IRON AND TIBC
IRON: 136 ug/dL (ref 28–170)
SATURATION RATIOS: 49 % — AB (ref 10.4–31.8)
TIBC: 280 ug/dL (ref 250–450)
UIBC: 144 ug/dL

## 2017-02-16 LAB — HAPTOGLOBIN: HAPTOGLOBIN: 87 mg/dL (ref 34–200)

## 2017-02-16 LAB — VITAMIN B12: VITAMIN B 12: 524 pg/mL (ref 180–914)

## 2017-02-16 LAB — FERRITIN: FERRITIN: 136 ng/mL (ref 11–307)

## 2017-02-17 LAB — URINE CULTURE: Culture: NO GROWTH

## 2017-02-18 ENCOUNTER — Telehealth (INDEPENDENT_AMBULATORY_CARE_PROVIDER_SITE_OTHER): Payer: Self-pay | Admitting: *Deleted

## 2017-02-18 ENCOUNTER — Encounter (INDEPENDENT_AMBULATORY_CARE_PROVIDER_SITE_OTHER): Payer: Self-pay | Admitting: Internal Medicine

## 2017-02-18 ENCOUNTER — Ambulatory Visit (INDEPENDENT_AMBULATORY_CARE_PROVIDER_SITE_OTHER): Payer: Medicare Other | Admitting: Internal Medicine

## 2017-02-18 ENCOUNTER — Other Ambulatory Visit (HOSPITAL_COMMUNITY): Payer: Self-pay | Admitting: *Deleted

## 2017-02-18 ENCOUNTER — Encounter (INDEPENDENT_AMBULATORY_CARE_PROVIDER_SITE_OTHER): Payer: Self-pay | Admitting: *Deleted

## 2017-02-18 VITALS — BP 94/60 | HR 76 | Temp 97.9°F | Ht 62.0 in | Wt 105.1 lb

## 2017-02-18 DIAGNOSIS — R9389 Abnormal findings on diagnostic imaging of other specified body structures: Secondary | ICD-10-CM | POA: Diagnosis not present

## 2017-02-18 DIAGNOSIS — R1310 Dysphagia, unspecified: Secondary | ICD-10-CM

## 2017-02-18 DIAGNOSIS — D473 Essential (hemorrhagic) thrombocythemia: Secondary | ICD-10-CM | POA: Diagnosis not present

## 2017-02-18 DIAGNOSIS — R1319 Other dysphagia: Secondary | ICD-10-CM | POA: Insufficient documentation

## 2017-02-18 DIAGNOSIS — D539 Nutritional anemia, unspecified: Secondary | ICD-10-CM

## 2017-02-18 DIAGNOSIS — I2510 Atherosclerotic heart disease of native coronary artery without angina pectoris: Secondary | ICD-10-CM

## 2017-02-18 LAB — OCCULT BLOOD X 1 CARD TO LAB, STOOL
FECAL OCCULT BLD: NEGATIVE
Fecal Occult Bld: NEGATIVE
Fecal Occult Bld: NEGATIVE

## 2017-02-18 LAB — METHYLMALONIC ACID, SERUM: METHYLMALONIC ACID, QUANTITATIVE: 292 nmol/L (ref 0–378)

## 2017-02-18 LAB — PATHOLOGIST SMEAR REVIEW

## 2017-02-18 LAB — HOMOCYSTEINE: Homocysteine: 17.6 umol/L — ABNORMAL HIGH (ref 0.0–15.0)

## 2017-02-18 MED ORDER — PEG 3350-KCL-NA BICARB-NACL 420 G PO SOLR: 4000.0000 mL | Freq: Once | ORAL | 0 refills | Status: AC

## 2017-02-18 NOTE — Telephone Encounter (Signed)
Patient needs trilyte 

## 2017-02-18 NOTE — Patient Instructions (Signed)
The risks of bleeding, perforation and infection were reviewed with patient.  

## 2017-02-18 NOTE — Progress Notes (Addendum)
Subjective:    Patient ID: Ashley [name], female    DOB: 11-Apr-1951, 66 y.o.   MRN: [id number]  HPI Referred by Angelina Ok FNP (Kenneth. Milus Glazier) for dysphagia.  She tells me she had an abnormal CT  In October.  Underwent a CT scan for wt loss and SOB.  Underwent back surgery in 2017 and neck surgery in June of this year. She tells me she had not put her wt back on. She has lost about 30-35 pounds since August of last year. She also tells me she is having some dysphagia. When she eats she has to have a liquid to chase it with. Started after her neck surgery this year.  She has a BM when she takes Miralax.  She usually has a BM every 2-3 days She has fullness after she eats a small amt.    01/20/2017 CT chest with CM: SOB and wt loss.  Post prandial left upper quadrant pain x 6 months.  IMPRESSION: 1. Three indeterminate liver lesions, with relatively stable CT appearance, measuring up to 1.2 cm. These did not demonstrate increased metabolic activity on prior PET-CT and therefore were determined to likely be benign on PET-CT 05/25/2016. 2. Circumferential mucosal thickening of the gastric cardia. Asymmetric mucosal thickening of the ascending colon, slightly upstream to the ileocecal valve. Both of these findings are nonspecific, but underlying malignancy cannot be excluded. Please correlate to colonoscopy and upper endoscopy. 3. Stable findings of chronic pancreatitis. 4. Stable sub pathologic by CT criteria upper abdominal retroperitoneal lymph nodes. 5. Tortuosity and calcific atherosclerotic disease of the aorta. 6. Stable in appearance bilateral pulmonary nodules, which did not demonstrate increased metabolic activity on prior PET-CT, favoring benign etiology. 7. Calcific atherosclerotic disease of the coronary arteries. 8. Fusiform aneurysmal dilation of the ascending aorta measuring 4.4 cm in maximum diameter. Recommend annual imaging followup by CTA or MRA.  This recommendation follows 2010 ACCF/AHA/AATS/ACR/ASA/SCA/SCAI/SIR/STS/SVM Guidelines for the Diagnosis and Management of Patients with Thoracic Aortic Disease. Circulation. 2010; 121: e266-e369 9. Moderate in severity emphysema.        Colonoscopy in 2015 (Hx of colonic adenomas)  She had 2 colonoscopies in 2012 with removal of multiple tubular adenomas as well as tubulovillous adenomas. Her daughter was diagnosed with rectal adenocarcinoma at age 67. She is in remission. Impression:  Examination performed to cecum. 6 small polyps ablated via cold biopsy and submitted together( 1 at ascending colon, 4 at transverse colon and 1 at sigmoid colon). Melanosis coli. Biopsy: tubular adenoma.   Hx of left breast cancer, post mastectomy and chemotherapy in 1995. Hx of Thrombocytopenia and is followed by [facility].   Review of Systems Past Medical History:  Diagnosis Date  . Adenocarcinoma of left breast ([place]) 01/09/2016  . Arthritis   . Ascending aortic aneurysm ([place])   . Cancer [facility]) 1995   breast, left, mastectomy/chemo  . Chest pain    Possibly cardiac. No evidence of ischemia/injury based upon normal troponin I. Chest discomfort could be tachycardia induced supply demand mismatch.   . CHF (congestive heart failure) ([place]) 11/17/2015   after surgery   . Colon adenomas   . Coronary artery disease   . Dyspnea    with exertion  . Emphysema of lung ([place]) 11/17/2015  . Essential hypertension, benign   . GERD (gastroesophageal reflux disease)   . Hyperlipidemia   . Hypertension   . Melanoma ([place]) 01/09/2016  . Palpitations   . Pernicious anemia 03/06/2016  .  Pure hypercholesterolemia   . Thrombocythemia, essential ([place]) 01/09/2016  . Thrombocytosis ([place])    Idiopathic    Past Surgical History:  Procedure Laterality Date  . APPENDECTOMY    . BONE MARROW ASPIRATION  07/2012  . BONE MARROW BIOPSY  07/2012  . BREAST SURGERY    . CARDIAC CATHETERIZATION    . IR  GENERIC HISTORICAL  01/11/2016   IR RADIOLOGY PERIPHERAL GUIDED IV START 01/11/2016 Saverio Danker, PA-C [facility]-INTER[facility] RAD  . IR GENERIC HISTORICAL  01/11/2016   IR US GUIDE VASC ACCESS RIGHT 01/11/2016 Saverio Danker, PA-C [facility]INTER[facility]  . MASTECTOMY     left  . OVARIAN CYST SURGERY     x2  . TUBAL LIGATION      Allergies  Allergen Reactions  . Penicillins Hives and Rash    Has patient had a PCN reaction causing immediate rash, facial/tongue/throat swelling, SOB or lightheadedness with hypotension: Yes Has patient had a PCN reaction causing severe rash involving mucus membranes or skin necrosis: Yes Has patient had a PCN reaction that required hospitalization No Has patient had a PCN reaction occurring within the last 10 years: Yes If all of the above answers are "NO", then may proceed with Cephalosporin use.   . Tape Rash    Medipore, Coban, and paper tape CAN be tolerated    Current Outpatient Medications on File Prior to Visit  Medication Sig Dispense Refill  . aspirin EC 81 MG tablet Take 81 mg by mouth daily.    . Calcium Carb-Cholecalciferol (CALCIUM 600+D) 600-800 MG-UNIT TABS Take 1 tablet by mouth 2 (two) times daily.    . Cholecalciferol (VITAMIN D-3) 1000 units CAPS Take 1 capsule by mouth daily.    . clonazePAM (KLONOPIN) 0.5 MG tablet TAKE (1) TABLET BY MOUTH AT BEDTIME. 30 tablet 3  . cyanocobalamin (,VITAMIN B-12,) 1000 MCG/ML injection INJECT 1 ML INTO THE MUSCLE ONCE MONTHLY AS DIRECTED. 1 mL 5  . esomeprazole (NEXIUM) 40 MG capsule Take 40 mg by mouth daily.    . furosemide (LASIX) 40 MG tablet Take 80 mg by mouth daily.    Marland Kitchen gabapentin (NEURONTIN) 300 MG capsule Take 300 mg by mouth at bedtime.    . ibandronate (BONIVA) 150 MG tablet Take 1 tablet by mouth every 30 (thirty) days.    Marland Kitchen ipratropium-albuterol (DUONEB) 0.5-2.5 (3) MG/3ML SOLN Take 3 mLs by nebulization every 6 (six) hours as needed (shortness of breath). 360 mL 0  . irbesartan (AVAPRO) 75 MG tablet Take 1  tablet by mouth daily.    . meloxicam (MOBIC) 15 MG tablet Take 15 mg by mouth daily.    . methocarbamol (ROBAXIN) 500 MG tablet Take 500 mg by mouth every 6 (six) hours as needed for muscle spasms.    . potassium chloride SA (K-DUR,KLOR-CON) 20 MEQ tablet Take 1 tablet (20 mEq total) by mouth 3 (three) times daily. 90 tablet 5  . rosuvastatin (CRESTOR) 40 MG tablet Take 40 mg by mouth daily.      . traMADol (ULTRAM) 50 MG tablet Take 50-100 mg by mouth 3 (three) times daily as needed (pain).      No current facility-administered medications on file prior to visit.         Objective:   Physical Exam Blood pressure 94/60, pulse 76, temperature 97.9 F (36.6 C), height '5\' 2"'  (1.575 m), weight 105 lb 1.6 oz (47.7 kg).' Alert and oriented. Skin warm and dry. Oral mucosa is moist.   . Sclera anicteric,  conjunctivae is pink. Thyroid not enlarged. No cervical lymphadenopathy. Lungs clear. Heart regular rate and rhythm.  Abdomen is soft. Bowel sounds are positive. No hepatomegaly. No abdominal masses felt. No tenderness.  No edema to lower extremities.           Assessment & Plan:  Abnormal CT.  Also has some dysphagia.  Colonic neoplasm needs to be ruled out EGD/ possible ED/Colonoscoppy. The risks of bleeding, perforation and infection were reviewed with patient.

## 2017-02-25 ENCOUNTER — Encounter (HOSPITAL_COMMUNITY): Payer: Self-pay | Admitting: *Deleted

## 2017-02-25 ENCOUNTER — Ambulatory Visit (HOSPITAL_COMMUNITY)
Admission: RE | Admit: 2017-02-25 | Discharge: 2017-02-25 | Disposition: A | Discharge status: Home / Self Care | Payer: Medicare Other | Source: Ambulatory Visit | Attending: Internal Medicine | Admitting: Internal Medicine

## 2017-02-25 ENCOUNTER — Encounter (HOSPITAL_COMMUNITY)
Admission: RE | Disposition: A | Discharge status: Home / Self Care | Payer: Self-pay | Source: Ambulatory Visit | Attending: Internal Medicine

## 2017-02-25 ENCOUNTER — Other Ambulatory Visit: Payer: Self-pay

## 2017-02-25 DIAGNOSIS — Z86010 Personal history of colonic polyps: Secondary | ICD-10-CM | POA: Insufficient documentation

## 2017-02-25 DIAGNOSIS — R1319 Other dysphagia: Secondary | ICD-10-CM | POA: Insufficient documentation

## 2017-02-25 DIAGNOSIS — I509 Heart failure, unspecified: Secondary | ICD-10-CM | POA: Insufficient documentation

## 2017-02-25 DIAGNOSIS — Z1211 Encounter for screening for malignant neoplasm of colon: Secondary | ICD-10-CM | POA: Insufficient documentation

## 2017-02-25 DIAGNOSIS — R1314 Dysphagia, pharyngoesophageal phase: Secondary | ICD-10-CM | POA: Insufficient documentation

## 2017-02-25 DIAGNOSIS — Z880 Allergy status to penicillin: Secondary | ICD-10-CM | POA: Insufficient documentation

## 2017-02-25 DIAGNOSIS — Z79899 Other long term (current) drug therapy: Secondary | ICD-10-CM | POA: Insufficient documentation

## 2017-02-25 DIAGNOSIS — Z800 Family history of malignant neoplasm of digestive organs: Secondary | ICD-10-CM | POA: Diagnosis not present

## 2017-02-25 DIAGNOSIS — E7800 Pure hypercholesterolemia, unspecified: Secondary | ICD-10-CM | POA: Diagnosis not present

## 2017-02-25 DIAGNOSIS — K3189 Other diseases of stomach and duodenum: Secondary | ICD-10-CM | POA: Insufficient documentation

## 2017-02-25 DIAGNOSIS — I2510 Atherosclerotic heart disease of native coronary artery without angina pectoris: Secondary | ICD-10-CM | POA: Insufficient documentation

## 2017-02-25 DIAGNOSIS — Z853 Personal history of malignant neoplasm of breast: Secondary | ICD-10-CM | POA: Diagnosis not present

## 2017-02-25 DIAGNOSIS — Z85820 Personal history of malignant melanoma of skin: Secondary | ICD-10-CM | POA: Insufficient documentation

## 2017-02-25 DIAGNOSIS — K449 Diaphragmatic hernia without obstruction or gangrene: Secondary | ICD-10-CM | POA: Diagnosis not present

## 2017-02-25 DIAGNOSIS — K5730 Diverticulosis of large intestine without perforation or abscess without bleeding: Secondary | ICD-10-CM | POA: Insufficient documentation

## 2017-02-25 DIAGNOSIS — R9389 Abnormal findings on diagnostic imaging of other specified body structures: Secondary | ICD-10-CM | POA: Insufficient documentation

## 2017-02-25 DIAGNOSIS — K219 Gastro-esophageal reflux disease without esophagitis: Secondary | ICD-10-CM | POA: Insufficient documentation

## 2017-02-25 DIAGNOSIS — Z7982 Long term (current) use of aspirin: Secondary | ICD-10-CM | POA: Insufficient documentation

## 2017-02-25 DIAGNOSIS — I110 Hypertensive heart disease with heart failure: Secondary | ICD-10-CM | POA: Insufficient documentation

## 2017-02-25 DIAGNOSIS — F17210 Nicotine dependence, cigarettes, uncomplicated: Secondary | ICD-10-CM | POA: Diagnosis not present

## 2017-02-25 DIAGNOSIS — Z8249 Family history of ischemic heart disease and other diseases of the circulatory system: Secondary | ICD-10-CM | POA: Insufficient documentation

## 2017-02-25 DIAGNOSIS — D473 Essential (hemorrhagic) thrombocythemia: Secondary | ICD-10-CM | POA: Insufficient documentation

## 2017-02-25 DIAGNOSIS — K6289 Other specified diseases of anus and rectum: Secondary | ICD-10-CM

## 2017-02-25 DIAGNOSIS — R933 Abnormal findings on diagnostic imaging of other parts of digestive tract: Secondary | ICD-10-CM | POA: Diagnosis not present

## 2017-02-25 DIAGNOSIS — D127 Benign neoplasm of rectosigmoid junction: Secondary | ICD-10-CM | POA: Diagnosis not present

## 2017-02-25 DIAGNOSIS — D128 Benign neoplasm of rectum: Secondary | ICD-10-CM | POA: Diagnosis not present

## 2017-02-25 DIAGNOSIS — K228 Other specified diseases of esophagus: Secondary | ICD-10-CM | POA: Diagnosis not present

## 2017-02-25 DIAGNOSIS — Z09 Encounter for follow-up examination after completed treatment for conditions other than malignant neoplasm: Secondary | ICD-10-CM | POA: Diagnosis not present

## 2017-02-25 DIAGNOSIS — D126 Benign neoplasm of colon, unspecified: Secondary | ICD-10-CM | POA: Insufficient documentation

## 2017-02-25 DIAGNOSIS — R1310 Dysphagia, unspecified: Secondary | ICD-10-CM | POA: Insufficient documentation

## 2017-02-25 HISTORY — PX: ESOPHAGOGASTRODUODENOSCOPY: SHX5428

## 2017-02-25 HISTORY — PX: POLYPECTOMY: SHX5525

## 2017-02-25 HISTORY — PX: COLONOSCOPY: SHX5424

## 2017-02-25 HISTORY — PX: ESOPHAGEAL DILATION: SHX303

## 2017-02-25 SURGERY — COLONOSCOPY
Anesthesia: Moderate Sedation

## 2017-02-25 MED ORDER — LIDOCAINE VISCOUS 2 % MT SOLN
OROMUCOSAL | Status: AC
  Filled 2017-02-25: qty 15

## 2017-02-25 MED ORDER — MEPERIDINE HCL 50 MG/ML IJ SOLN
INTRAMUSCULAR | Status: DC | PRN
  Administered 2017-02-25 (×2): 25 mg

## 2017-02-25 MED ORDER — MIDAZOLAM HCL 5 MG/5ML IJ SOLN
INTRAMUSCULAR | Status: AC
  Filled 2017-02-25: qty 10

## 2017-02-25 MED ORDER — SODIUM CHLORIDE 0.9 % IV SOLN
INTRAVENOUS | Status: DC
  Administered 2017-02-25: 11:00:00 via INTRAVENOUS

## 2017-02-25 MED ORDER — MIDAZOLAM HCL 5 MG/5ML IJ SOLN
INTRAMUSCULAR | Status: DC | PRN
  Administered 2017-02-25: 1 mg via INTRAVENOUS
  Administered 2017-02-25 (×2): 2 mg via INTRAVENOUS
  Administered 2017-02-25 (×2): 1 mg via INTRAVENOUS
  Administered 2017-02-25: 2 mg via INTRAVENOUS
  Administered 2017-02-25: 1 mg via INTRAVENOUS

## 2017-02-25 MED ORDER — MEPERIDINE HCL 50 MG/ML IJ SOLN
INTRAMUSCULAR | Status: AC
  Filled 2017-02-25: qty 1

## 2017-02-25 NOTE — Op Note (Signed)
[facility] Patient Name: Ashley [name] Procedure Date: 02/25/2017 11:11 AM MRN: [id number] Date of Birth: 10/23/1950 Attending MD: Hildred Laser , MD CSN: [id number] Age: 66 Admit Type: Outpatient Procedure:                Upper GI endoscopy Indications:              Esophageal dysphagia, Abnormal CT of the GI tract Providers:                Hildred Laser, MD, Charlsie Quest. Theda Sers RN, RN,                            Randa Spike, Technician Referring MD:             [name], FNP Medicines:                Lidocaine spray, Meperidine 50 mg IV, Midazolam 6                            mg IV Complications:            No immediate complications. Estimated Blood Loss:     Estimated blood loss: none. Procedure:                Pre-Anesthesia Assessment:                           - Prior to the procedure, a History and Physical                            was performed, and patient medications and                            allergies were reviewed. The patient's tolerance of                            previous anesthesia was also reviewed. The risks                            and benefits of the procedure and the sedation                            options and risks were discussed with the patient.                            All questions were answered, and informed consent                            was obtained. Prior Anticoagulants: The patient                            last took aspirin 3 days prior to the procedure.                            ASA Grade Assessment: III - A patient with severe  systemic disease. After reviewing the risks and                            benefits, the patient was deemed in satisfactory                            condition to undergo the procedure.                           After obtaining informed consent, the endoscope was                            passed under direct vision. Throughout the   procedure, the patient's blood pressure, pulse, and                            oxygen saturations were monitored continuously. The                            [id number]-[id number] ([id number]) scope was introduced through the                            mouth, and advanced to the second part of duodenum.                            The upper GI endoscopy was accomplished without                            difficulty. The patient tolerated the procedure                            well. Scope In: 11:48:21 AM Scope Out: 11:57:03 AM Total Procedure Duration: 0 hours 8 minutes 42 seconds  Findings:      The examined esophagus was normal.      The Z-line was irregular and was found 38 cm from the incisors.      A 2 cm hiatal hernia was present.      No endoscopic abnormality was evident in the esophagus to explain the       patient's complaint of dysphagia. It was decided, however, to proceed       with dilation of the entire esophagus. The dilation site was examined       following endoscope reinsertion and showed no change and no bleeding,       mucosal tear or perforation.      Localized mildly erythematous mucosa without bleeding was found in the       prepyloric region of the stomach.      The exam of the stomach was otherwise normal.      The duodenal bulb and second portion of the duodenum were normal. Impression:               - Normal esophagus.                           - Z-line irregular, 38 cm from the incisors.                           -  2 cm hiatal hernia.                           - No endoscopic esophageal abnormality to explain                            patient's dysphagia. Esophagus dilated.                           - Erythematous mucosa in the prepyloric region of                            the stomach (NSAID).                           - Normal duodenal bulb and second portion of the                            duodenum.                           - No specimens collected. Moderate  Sedation:      Moderate (conscious) sedation was administered by the endoscopy nurse       and supervised by the endoscopist. The following parameters were       monitored: oxygen saturation, heart rate, blood pressure, CO2       capnography and response to care. Total physician intraservice time was       9 minutes. Recommendation:           - Patient has a contact number available for                            emergencies. The signs and symptoms of potential                            delayed complications were discussed with the                            patient. Return to normal activities tomorrow.                            Written discharge instructions were provided to the                            patient.                           - Resume previous diet today.                           - Continue present medications.                           - Call office with progress report in one week.                           - See  other report. Procedure Code(s):        --- Professional ---                           [id number], Esophagogastroduodenoscopy, flexible,                            transoral; diagnostic, including collection of                            specimen(s) by brushing or washing, when performed                            (separate procedure) Diagnosis Code(s):        --- Professional ---                           K22.8, Other specified diseases of esophagus                           K44.9, Diaphragmatic hernia without obstruction or                            gangrene                           K31.89, Other diseases of stomach and duodenum                           R13.14, Dysphagia, pharyngoesophageal phase                           R93.3, Abnormal findings on diagnostic imaging of                            other parts of digestive tract CPT copyright 2016 American Medical Association. All rights reserved. The codes documented in this report are preliminary and upon  coder review may  be revised to meet current compliance requirements. Hildred Laser, MD Hildred Laser, MD 02/25/2017 12:33:51 PM This report has been signed electronically. Number of Addenda: 0

## 2017-02-25 NOTE — Discharge Instructions (Signed)
Resume aspirin on 02/27/2017. Resume other medications and diet as before. No driving for 24 hours. Physician will call with biopsy results.   Colonoscopy, Adult, Care After This sheet gives you information about how to care for yourself after your procedure. Your health care provider may also give you more specific instructions. If you have problems or questions, contact your health care provider. What can I expect after the procedure? After the procedure, it is common to have:  A small amount of blood in your stool for 24 hours after the procedure.  Some gas.  Mild abdominal cramping or bloating.  Follow these instructions at home: General instructions   For the first 24 hours after the procedure: ? Do not drive or use machinery. ? Do not sign important documents. ? Do not drink alcohol. ? Do your regular daily activities at a slower pace than normal. ? Eat soft, easy-to-digest foods. ? Rest often.  Take over-the-counter or prescription medicines only as told by your health care provider.  It is up to you to get the results of your procedure. Ask your health care provider, or the department performing the procedure, when your results will be ready. Relieving cramping and bloating  Try walking around when you have cramps or feel bloated.  Apply heat to your abdomen as told by your health care provider. Use a heat source that your health care provider recommends, such as a moist heat pack or a heating pad. ? Place a towel between your skin and the heat source. ? Leave the heat on for 20-30 minutes. ? Remove the heat if your skin turns bright red. This is especially important if you are unable to feel pain, heat, or cold. You may have a greater risk of getting burned. Eating and drinking  Drink enough fluid to keep your urine clear or pale yellow.  Resume your normal diet as instructed by your health care provider. Avoid heavy or fried foods that are hard to  digest.  Avoid drinking alcohol for as long as instructed by your health care provider. Contact a health care provider if:  You have blood in your stool 2-3 days after the procedure. Get help right away if:  You have more than a small spotting of blood in your stool.  You pass large blood clots in your stool.  Your abdomen is swollen.  You have nausea or vomiting.  You have a fever.  You have increasing abdominal pain that is not relieved with medicine. This information is not intended to replace advice given to you by your health care provider. Make sure you discuss any questions you have with your health care provider.   Colon Polyps Polyps are tissue growths inside the body. Polyps can grow in many places, including the large intestine (colon). A polyp may be a round bump or a mushroom-shaped growth. You could have one polyp or several. Most colon polyps are noncancerous (benign). However, some colon polyps can become cancerous over time. What are the causes? The exact cause of colon polyps is not known. What increases the risk? This condition is more likely to develop in people who:  Have a family history of colon cancer or colon polyps.  Are older than 72 or older than 45 if they are African American.  Have inflammatory bowel disease, such as ulcerative colitis or Crohn disease.  Are overweight.  Smoke cigarettes.  Do not get enough exercise.  Drink too much alcohol.  Eat a diet that is: ?  High in fat and red meat. ? Low in fiber.  Had childhood cancer that was treated with abdominal radiation.  What are the signs or symptoms? Most polyps do not cause symptoms. If you have symptoms, they may include:  Blood coming from your rectum when having a bowel movement.  Blood in your stool.The stool may look dark red or black.  A change in bowel habits, such as constipation or diarrhea.  How is this diagnosed? This condition is diagnosed with a colonoscopy.  This is a procedure that uses a lighted, flexible scope to look at the inside of your colon. How is this treated? Treatment for this condition involves removing any polyps that are found. Those polyps will then be tested for cancer. If cancer is found, your health care provider will talk to you about options for colon cancer treatment. Follow these instructions at home: Diet  Eat plenty of fiber, such as fruits, vegetables, and whole grains.  Eat foods that are high in calcium and vitamin D, such as milk, cheese, yogurt, eggs, liver, fish, and broccoli.  Limit foods high in fat, red meats, and processed meats, such as hot dogs, sausage, bacon, and lunch meats.  Maintain a healthy weight, or lose weight if recommended by your health care provider. General instructions  Do not smoke cigarettes.  Do not drink alcohol excessively.  Keep all follow-up visits as told by your health care provider. This is important. This includes keeping regularly scheduled colonoscopies. Talk to your health care provider about when you need a colonoscopy.  Exercise every day or as told by your health care provider. Contact a health care provider if:  You have new or worsening bleeding during a bowel movement.  You have new or increased blood in your stool.  You have a change in bowel habits.  You unexpectedly lose weight. This information is not intended to replace advice given to you by your health care provider. Make sure you discuss any questions you have with your health care provider.

## 2017-02-25 NOTE — Op Note (Signed)
[facility] Patient Name: Ashley [name] Procedure Date: 02/25/2017 11:58 AM MRN: [id number] Date of Birth: 03/09/1951 Attending MD: Hildred Laser , MD CSN: [id number] Age: 66 Admit Type: Outpatient Procedure:                Colonoscopy Indications:              High risk colon cancer surveillance: Personal                            history of colonic polyps, Family history of colon                            cancer in a first-degree relative Providers:                Hildred Laser, MD, Selena Lesser RN, RN, Randa Spike, Technician Referring MD:             [name], FNP Medicines:                Midazolam 4 mg IV Complications:            No immediate complications. Estimated Blood Loss:     Estimated blood loss was minimal. Procedure:                Pre-Anesthesia Assessment:                           - Prior to the procedure, a History and Physical                            was performed, and patient medications and                            allergies were reviewed. The patient's tolerance of                            previous anesthesia was also reviewed. The risks                            and benefits of the procedure and the sedation                            options and risks were discussed with the patient.                            All questions were answered, and informed consent                            was obtained. Prior Anticoagulants: The patient                            last took aspirin 3 days prior to the procedure.  ASA Grade Assessment: III - A patient with severe                            systemic disease. After reviewing the risks and                            benefits, the patient was deemed in satisfactory                            condition to undergo the procedure.                           After obtaining informed consent, the colonoscope                            was passed  under direct vision. Throughout the                            procedure, the patient's blood pressure, pulse, and                            oxygen saturations were monitored continuously. The                            EC-[id number] ([id number]) scope was introduced through                            the anus and advanced to the the cecum, identified                            by appendiceal orifice and ileocecal valve. The                            colonoscopy was somewhat difficult due to a                            tortuous colon. The patient tolerated the procedure                            well. The quality of the bowel preparation was                            good. The ileocecal valve, appendiceal orifice, and                            rectum were photographed. Scope In: 12:00:11 PM Scope Out: 12:21:19 PM Scope Withdrawal Time: 0 hours 9 minutes 45 seconds  Total Procedure Duration: 0 hours 21 minutes 8 seconds  Findings:      The perianal and digital rectal examinations were normal.      Two medium-mouthed diverticula were found in the hepatic flexure.      A 5 mm polyp was found in the recto-sigmoid colon. The polyp was       sessile. The polyp was removed with a  cold snare. Resection and       retrieval were complete.      The exam was otherwise normal throughout the examined colon.      Anal papilla(e) were hypertrophied. Impression:               - Diverticulosis at the hepatic flexure.                           - One 5 mm polyp at the recto-sigmoid colon,                            removed with a cold snare. Resected and retrieved.                           - Anal papilla. Moderate Sedation:      Moderate (conscious) sedation was administered by the endoscopy nurse       and supervised by the endoscopist. The following parameters were       monitored: oxygen saturation, heart rate, blood pressure, CO2       capnography and response to care. Total physician intraservice  time was       31 minutes. Recommendation:           - Patient has a contact number available for                            emergencies. The signs and symptoms of potential                            delayed complications were discussed with the                            patient. Return to normal activities tomorrow.                            Written discharge instructions were provided to the                            patient.                           - Resume previous diet today.                           - Continue present medications.                           - No aspirin, ibuprofen, naproxen, or other                            non-steroidal anti-inflammatory drugs for 1 day.                           - Await pathology results.                           - Repeat colonoscopy in 5 years for surveillance. Procedure Code(s):        ---  Professional ---                           [phone], Colonoscopy, flexible; with removal of                            tumor(s), polyp(s), or other lesion(s) by snare                            technique                           99152, Moderate sedation services provided by the                            same physician or other qualified health care                            professional performing the diagnostic or                            therapeutic service that the sedation supports,                            requiring the presence of an independent trained                            observer to assist in the monitoring of the                            patient's level of consciousness and physiological                            status; initial 15 minutes of intraservice time,                            patient age 96 years or older                           [phone], Moderate sedation services; each additional                            15 minutes intraservice time Diagnosis Code(s):        --- Professional ---                            Z86.010, Personal history of colonic polyps                           D12.7, Benign neoplasm of rectosigmoid junction                           K62.89, Other specified diseases of anus and rectum                           Z80.0, Family history  of malignant neoplasm of                            digestive organs                           K57.30, Diverticulosis of large intestine without                            perforation or abscess without bleeding CPT copyright 2016 [employer]. All rights reserved. The codes documented in this report are preliminary and upon coder review may  be revised to meet current compliance requirements. Hildred Laser, MD Hildred Laser, MD 02/25/2017 12:37:59 PM This report has been signed electronically. Number of Addenda: 0

## 2017-02-25 NOTE — H&P (Signed)
Ashley [name] is an 66 y.o. female.   Chief Complaint: Patient is here for EGD, EGD and colonoscopy. HPI: She is 66 year old Caucasian female with multiple medical problems including history of multiple colonic polyps presents with 5 history of dysphagia to solids.  This problem started when she had neck surgery in June this year.  Sometimes she has to wash food down with liquids.  She feels dysphagia may be slightly better.  She has heartburn is well controlled with therapy.  Last year she lost over 30 pounds.  She has stopped losing weight.  She is not sure why this happened.  She had chest and abdominal pelvic CT per 1 month ago.  She was noted to have thickening to proximal stomach in the region of cardia.  She also had vague wall thickening descending colon.  She is prone to constipation.  She denies abdominal pain rectal bleeding or melena. She is significant for CRC in daughter who was 42 at the time of diagnosis and is in remission.  Her other daughter has celiac disease. She drinks alcohol occasionally and smokes half a pack of cigarettes per day.  She is trying her best to quit smoking. Past Medical History:  Diagnosis Date  . Adenocarcinoma of left breast ([place]) 01/09/2016  . Arthritis   . Ascending aortic aneurysm ([place])   . Cancer [facility]) 1995   breast, left, mastectomy/chemo  . Chest pain    Possibly cardiac. No evidence of ischemia/injury based upon normal troponin I. Chest discomfort could be tachycardia induced supply demand mismatch.   . CHF (congestive heart failure) ([place]) 11/17/2015   after surgery   . Colon adenomas   . Coronary artery disease   . Dyspnea    with exertion  . Emphysema of lung ([place]) 11/17/2015  . Essential hypertension, benign   . GERD (gastroesophageal reflux disease)   . Hyperlipidemia   . Hypertension   . Melanoma ([place]) 01/09/2016  . Palpitations   . Pernicious anemia 03/06/2016  . Pure hypercholesterolemia   . 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  Thrombocytosis ([place])    Idiopathic    Past Surgical History:  Procedure Laterality Date  . APPENDECTOMY    . BONE MARROW ASPIRATION  07/2012  . BONE MARROW BIOPSY  07/2012  . BREAST SURGERY    . CARDIAC CATHETERIZATION    . IR GENERIC HISTORICAL  01/11/2016   IR RADIOLOGY PERIPHERAL GUIDED IV START 01/11/2016 Saverio Danker, PA-C [facility]  . IR GENERIC HISTORICAL  01/11/2016   IR US GUIDE VASC ACCESS RIGHT 01/11/2016 Saverio Danker, PA-C [facility]  . MASTECTOMY     left  . OVARIAN CYST SURGERY     x2  . TUBAL LIGATION      Family History  Problem Relation Age of Onset  . Hypertension Mother   . Heart failure Mother   . Congestive Heart Failure Mother   . COPD Mother   . Pernicious anemia Mother   . Cancer Mother        lung  . Hypertension Father   . CAD Father   . Heart attack Father   . Hypertension Sister   . Cancer Other   . Celiac disease Other    Social History:  reports that she has been smoking cigarettes.  She has a 25.00 pack-year smoking history. she has never used smokeless tobacco. She reports that she does not drink alcohol or use drugs.  Allergies:  Allergies  Allergen Reactions  . Penicillins  Hives and Rash    Has patient had a PCN reaction causing immediate rash, facial/tongue/throat swelling, SOB or lightheadedness with hypotension: Yes Has patient had a PCN reaction causing severe rash involving mucus membranes or skin necrosis: Yes Has patient had a PCN reaction that required hospitalization No Has patient had a PCN reaction occurring within the last 10 years: Yes If all of the above answers are "NO", then may proceed with Cephalosporin use.   . Tape Rash    Medipore, Coban, and paper tape CAN be tolerated    Medications Prior to Admission  Medication Sig Dispense Refill  . aspirin EC 81 MG tablet Take 81 mg by mouth daily.    . Calcium Carb-Cholecalciferol (CALCIUM 600+D) 600-800 MG-UNIT TABS Take 1 tablet by mouth 2 (two) times daily.     . Cholecalciferol (VITAMIN D-3) 1000 units CAPS Take 1,000 Units daily by mouth.     . clonazePAM (KLONOPIN) 0.5 MG tablet TAKE (1) TABLET BY MOUTH AT BEDTIME. 30 tablet 3  . cyanocobalamin (,VITAMIN B-12,) 1000 MCG/ML injection INJECT 1 ML INTO THE MUSCLE ONCE MONTHLY AS DIRECTED. 1 mL 5  . Fluticasone-Salmeterol (ADVAIR DISKUS) 100-50 MCG/DOSE AEPB Inhale 1 puff 2 (two) times daily as needed into the lungs for shortness of breath.    . furosemide (LASIX) 40 MG tablet Take 40 mg daily by mouth.     . gabapentin (NEURONTIN) 300 MG capsule Take 300 mg by mouth at bedtime.    . ibandronate (BONIVA) 150 MG tablet Take 1 tablet by mouth every 30 (thirty) days.    Marland Kitchen ibuprofen (ADVIL,MOTRIN) 800 MG tablet Take 800 mg every 8 (eight) hours as needed by mouth for moderate pain.    Marland Kitchen irbesartan (AVAPRO) 75 MG tablet Take 75 mg at bedtime by mouth.     . meloxicam (MOBIC) 15 MG tablet Take 15 mg at bedtime by mouth.     . methocarbamol (ROBAXIN) 500 MG tablet Take 500 mg daily as needed by mouth for muscle spasms.     . pantoprazole (PROTONIX) 40 MG tablet Take 40 mg daily by mouth.    . potassium chloride SA (K-DUR,KLOR-CON) 20 MEQ tablet Take 1 tablet (20 mEq total) by mouth 3 (three) times daily. 90 tablet 5  . rosuvastatin (CRESTOR) 40 MG tablet Take 40 mg at bedtime by mouth.     . traMADol (ULTRAM) 50 MG tablet Take 50-100 mg by mouth 3 (three) times daily as needed (pain).     . hydroxyurea (HYDREA) 500 MG capsule Take 500-1,000 mg See admin instructions by mouth. 500 mg once daily Mondays and Tuesday and 1000 mg once daily on Wednesday through Sunday    . ipratropium-albuterol (DUONEB) 0.5-2.5 (3) MG/3ML SOLN Take 3 mLs by nebulization every 6 (six) hours as needed (shortness of breath). 360 mL 0    No results found for this or any previous visit (from the past 48 hour(s)). No results found.  ROS  Blood pressure (!) 142/76, pulse 62, resp. rate 15, SpO2 100 %. Physical Exam  Constitutional:   Well-developed thin Caucasian female in NAD.  HENT:  Mouth/Throat: Oropharynx is clear and moist.  Eyes: Conjunctivae are normal. No scleral icterus.  Neck: No thyromegaly present.  Cardiovascular: Normal rate, regular rhythm and normal heart sounds.  No murmur heard. Respiratory: Effort normal and breath sounds normal.  GI:  Abdomen is scaphoid.  She has is on the scar in lower abdomen.  Abdomen is soft and nontender without organomegaly or  masses.  Musculoskeletal: She exhibits no edema.  Lymphadenopathy:    She has no cervical adenopathy.  Neurological: She is alert.  Skin: Skin is warm and dry.     Assessment/Plan Solid food dysphagia. Unexplained weight loss Abnormal chest and abdominal CT. History of multiple colonic adenoma. EGD with ED and surveillance colonoscopy.    Hildred Laser, MD 02/25/2017, 11:34 AM

## 2017-02-28 ENCOUNTER — Encounter (HOSPITAL_COMMUNITY): Payer: Self-pay | Admitting: Internal Medicine

## 2017-03-01 ENCOUNTER — Other Ambulatory Visit (HOSPITAL_COMMUNITY): Payer: Self-pay | Admitting: *Deleted

## 2017-03-01 DIAGNOSIS — D696 Thrombocytopenia, unspecified: Secondary | ICD-10-CM

## 2017-03-04 ENCOUNTER — Encounter (HOSPITAL_BASED_OUTPATIENT_CLINIC_OR_DEPARTMENT_OTHER): Payer: Medicare Other | Admitting: Oncology

## 2017-03-04 ENCOUNTER — Encounter (HOSPITAL_COMMUNITY): Payer: Medicare Other

## 2017-03-04 ENCOUNTER — Encounter (HOSPITAL_COMMUNITY): Payer: Self-pay | Admitting: Oncology

## 2017-03-04 ENCOUNTER — Other Ambulatory Visit: Payer: Self-pay

## 2017-03-04 VITALS — BP 98/61 | HR 72 | Temp 98.4°F | Resp 16 | Ht 62.0 in | Wt 104.0 lb

## 2017-03-04 DIAGNOSIS — Z85820 Personal history of malignant melanoma of skin: Secondary | ICD-10-CM

## 2017-03-04 DIAGNOSIS — Z9012 Acquired absence of left breast and nipple: Secondary | ICD-10-CM

## 2017-03-04 DIAGNOSIS — D473 Essential (hemorrhagic) thrombocythemia: Secondary | ICD-10-CM | POA: Diagnosis present

## 2017-03-04 DIAGNOSIS — Z853 Personal history of malignant neoplasm of breast: Secondary | ICD-10-CM

## 2017-03-04 DIAGNOSIS — D510 Vitamin B12 deficiency anemia due to intrinsic factor deficiency: Secondary | ICD-10-CM | POA: Diagnosis not present

## 2017-03-04 DIAGNOSIS — Z171 Estrogen receptor negative status [ER-]: Secondary | ICD-10-CM

## 2017-03-04 DIAGNOSIS — D696 Thrombocytopenia, unspecified: Secondary | ICD-10-CM

## 2017-03-04 LAB — CBC WITH DIFFERENTIAL/PLATELET
BASOS ABS: 0 10*3/uL (ref 0.0–0.1)
BASOS PCT: 0 %
EOS ABS: 0.3 10*3/uL (ref 0.0–0.7)
EOS PCT: 5 %
HCT: 26.5 % — ABNORMAL LOW (ref 36.0–46.0)
HEMOGLOBIN: 8.6 g/dL — AB (ref 12.0–15.0)
Lymphocytes Relative: 28 %
Lymphs Abs: 1.6 10*3/uL (ref 0.7–4.0)
MCH: 39.6 pg — AB (ref 26.0–34.0)
MCHC: 32.5 g/dL (ref 30.0–36.0)
MCV: 122.1 fL — ABNORMAL HIGH (ref 78.0–100.0)
MONO ABS: 0.5 10*3/uL (ref 0.1–1.0)
MONOS PCT: 9 %
Neutro Abs: 3.3 10*3/uL (ref 1.7–7.7)
Neutrophils Relative %: 58 %
PLATELETS: 431 10*3/uL — AB (ref 150–400)
RBC: 2.17 MIL/uL — ABNORMAL LOW (ref 3.87–5.11)
RDW: 15.1 % (ref 11.5–15.5)
WBC: 5.7 10*3/uL (ref 4.0–10.5)

## 2017-03-04 NOTE — Progress Notes (Signed)
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 The 3 liver lesions are not hypermetabolic. The radiologist suspects they may be subtly present on prior CT chest from 10/2013, to further reassuring that these are likely benign lesions.       Melanoma ([place])   07/09/2013 Initial Biopsy    Initial biopsy L upper thigh/buttocks melanoma      07/20/2013 Surgery    Excision L lateral buttock/upper thigh melanoma, clear margins      07/20/2013 Pathology Results    Breslow depth 1.02 mm, pT2a        HISTORY OF PRESENTING ILLNESS:  Ashley [name] 66 y.o. female is here because of Essential thrombocytosis. She also has a history of breast cancer and melanoma.   Patient presents with her daughter today for continued follow-up.  She has been off of her Hydrea for the past 2 weeks as instructed on her last visit for worsening anemia.  Check fecal occult blood test x3 performed which were negative for blood.  EGD/colonoscopy was also performed on 02/25/2017 which was negative for any evidence of bleeding.  Lab workup with iron studies, vit B12, folate, haptoglobin, LDH, sed rate were all within normal limits.  CBC performed today demonstrated that her hemoglobin has increased from 7.9 g/dL, from 2 weeks ago, up to 8.6 g/dL today.  Since her Hydrea  has been held her platelets have gone up to 431K from 368K.  Patient states she still feels fatigued.  Denies any melena, hematochezia, chest pain, shortness of breath, abdominal pain, nausea, vomiting, diarrhea, recent infections.  MEDICAL HISTORY:  Past Medical History:  Diagnosis Date  . 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 Arthritis   . Ascending aortic aneurysm ([place])   . Cancer [facility]) 1995   breast, left, mastectomy/chemo  . Chest pain    Possibly cardiac. No evidence of ischemia/injury based upon normal troponin I. Chest discomfort could be tachycardia induced supply demand mismatch.   . CHF (congestive heart failure) ([place]) 11/17/2015   after surgery   . Colon adenomas   . Coronary artery disease   . Dyspnea    with exertion  . Emphysema of lung ([place]) 11/17/2015  . Essential hypertension, benign   . GERD (gastroesophageal reflux disease)   . Hyperlipidemia   . Hypertension   . Melanoma ([place]) 01/09/2016  . Palpitations   . Pernicious anemia 03/06/2016  . Pure hypercholesterolemia   . Thrombocythemia, essential ([place]) 01/09/2016  . Thrombocytosis ([place])    Idiopathic    SURGICAL HISTORY: Past Surgical History:  Procedure Laterality Date  . ANTERIOR (CYSTOCELE) AND POSTERIOR REPAIR (RECTOCELE) N/A 12/09/2014   Performed by Bjorn Loser, MD at [facility] ORS  .  ANTERIOR CERVICAL DECOMPRESSION FUSION, CERVICAL 4-5, CERVICAL 5-6, CERVICAL 6-7, CERVICAL 7 TO THORACIC 1 WITH INSTRUMENTATION AND ALLOGRAFT Right 10/03/2016   Performed by Phylliss Bob, MD at [place]    . BONE MARROW ASPIRATION  07/2012  . BONE MARROW BIOPSY  07/2012  . BREAST SURGERY    . CARDIAC CATHETERIZATION    . COLONOSCOPY N/A 02/25/2017   Performed by Rogene Houston, MD at [place]  . COLONOSCOPY N/A 02/18/2014   Performed by Rogene Houston, MD at [place]  . COLONOSCOPY N/A 11/29/2010   Performed by Rogene Houston, MD at [place]  . CYSTOSCOPY N/A 12/09/2014    Performed by Bjorn Loser, MD at [facility] ORS  . ESOPHAGEAL DILATION N/A 02/25/2017   Performed by Rogene Houston, MD at [place]  . ESOPHAGOGASTRODUODENOSCOPY (EGD) N/A 02/25/2017   Performed by Rogene Houston, MD at [place]  . HYSTERECTOMY VAGINAL N/A 12/09/2014   Performed by Servando Salina, MD at [facility] ORS  . IR GENERIC HISTORICAL  01/11/2016   IR RADIOLOGY PERIPHERAL GUIDED IV START 01/11/2016 Saverio Danker, PA-C [facility]  . IR GENERIC HISTORICAL  01/11/2016   IR US GUIDE VASC ACCESS RIGHT 01/11/2016 Saverio Danker, PA-C [facility]  . Left Heart Cath and Coronary Angiography N/A 01/20/2016   Performed by Burnell Blanks, MD at [facility]  . LEFT SIDED LATERAL INTERBODY FUSION, LUMBAR 2-3, LUMBAR 3-4, LUMBAR 4-5 WITH INSTRUMENTATION Left 11/16/2015   Performed by Phylliss Bob, MD at [place]  . MASTECTOMY     left  . OVARIAN CYST SURGERY     x2  . POLYPECTOMY  02/25/2017   Performed by Rogene Houston, MD at [place]  . POSTERIOR LUMBAR FUSION, LUMBAR 2-3, LUMBAR 3-4, LUMBAR 4-5 WITH INSTRUMENTATION AND ALLOGRAFT N/A 11/17/2015   Performed by Phylliss Bob, MD at [place]  . SALPINGO OOPHORECTOMY Bilateral 12/09/2014   Performed by Servando Salina, MD at [facility] ORS  . TUBAL LIGATION      SOCIAL HISTORY: Social History   Socioeconomic History  . Marital status: Divorced    Spouse name: Not on file  . Number of children: 2  . Years of education: Not on file  . Highest education level: Not on file  Social Needs  . Financial resource strain: Not on file  . Food insecurity - worry: Not on file  . Food insecurity - inability: Not on file  . Transportation needs - medical: Not on file  . Transportation needs - non-medical: Not on file  Occupational History  . Not on file  Tobacco Use  . Smoking status: Current Every Day Smoker    Packs/day: 0.50    Years: 50.00    Pack years: 25.00    Types: Cigarettes  . Smokeless tobacco: Never Used    Substance and Sexual Activity  . Alcohol use: No  . Drug use: No  . Sexual activity: Yes    Birth control/protection: Post-menopausal  Other Topics Concern  . Not on file  Social History Narrative  . Not on file   Divorced 2 children 4 grandchildren 1 great grandchild Smoker ETOH, social. Daughter notes 3 a year. She has no hobbies right now. She does like yard work. 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 Hypertension Mother   . Heart failure Mother   . Congestive Heart Failure Mother   . COPD Mother   . Pernicious anemia Mother   . Cancer Mother        lung  . Hypertension Father   . CAD Father   . Heart attack Father   . Hypertension Sister   . Cancer Other   . Celiac disease Other    Mother is still living at 75 yo, smokes, and weighs 87 lbs. Father deceased at 18 yo of heart attack 1 sister, she has high blood pressure  ALLERGIES:  is allergic to penicillins and tape.  MEDICATIONS:  Current Outpatient Medications  Medication Sig Dispense Refill  . aspirin EC 81 MG tablet Take 81 mg by mouth daily.    . Calcium Carb-Cholecalciferol (CALCIUM 600+D) 600-800 MG-UNIT TABS Take 1 tablet by mouth 2 (two) times daily.    . Cholecalciferol (VITAMIN D-3) 1000 units CAPS Take 1,000 Units daily by mouth.     . clonazePAM (KLONOPIN) 0.5 MG tablet TAKE (1) TABLET BY MOUTH AT BEDTIME. 30 tablet 3  . cyanocobalamin (,VITAMIN B-12,) 1000 MCG/ML injection INJECT 1 ML INTO THE MUSCLE ONCE MONTHLY AS DIRECTED. 1 mL 5  . Fluticasone-Salmeterol (ADVAIR DISKUS) 100-50 MCG/DOSE AEPB Inhale 1 puff 2 (two) times daily as needed into the lungs for shortness of breath.    . furosemide (LASIX) 40 MG tablet Take 40 mg daily by mouth.     . gabapentin (NEURONTIN) 300 MG capsule Take 300 mg by mouth at bedtime.    . hydroxyurea (HYDREA) 500 MG capsule Take 500-1,000  mg See admin instructions by mouth. 500 mg once daily Mondays and Tuesday and 1000 mg once daily on Wednesday through Sunday    . ibandronate (BONIVA) 150 MG tablet Take 1 tablet by mouth every 30 (thirty) days.    Marland Kitchen ibuprofen (ADVIL,MOTRIN) 800 MG tablet Take 800 mg every 8 (eight) hours as needed by mouth for moderate pain.    Marland Kitchen ipratropium-albuterol (DUONEB) 0.5-2.5 (3) MG/3ML SOLN Take 3 mLs by nebulization every 6 (six) hours as needed (shortness of breath). 360 mL 0  . irbesartan (AVAPRO) 75 MG tablet Take 75 mg at bedtime by mouth.     . meloxicam (MOBIC) 15 MG tablet Take 15 mg at bedtime by mouth.     . methocarbamol (ROBAXIN) 500 MG tablet Take 500 mg daily as needed by mouth for muscle spasms.     . pantoprazole (PROTONIX) 40 MG tablet Take 40 mg daily by mouth.    . potassium chloride SA (K-DUR,KLOR-CON) 20 MEQ tablet Take 1 tablet (20 mEq total) by mouth 3 (three) times daily. 90 tablet 5  . rosuvastatin (CRESTOR) 40 MG tablet Take 40 mg at bedtime by mouth.     . traMADol (ULTRAM) 50 MG tablet Take 50-100 mg by mouth 3 (three) times daily as needed (pain).      No current facility-administered medications for this visit.     Review of Systems  Constitutional: Positive for malaise/fatigue. Negative for weight loss.  HENT: Negative.   Eyes: Negative.   Respiratory: Negative.  Negative for shortness of breath.   Cardiovascular: Negative.  Negative for chest pain.  Gastrointestinal: Negative.  Negative for abdominal pain, nausea and vomiting.  Genitourinary: Negative.   Musculoskeletal: Negative for back pain and neck pain.  Skin: Negative.   Neurological: Negative.   Endo/Heme/Allergies: Negative.   Psychiatric/Behavioral: Negative.   All other  systems reviewed and are negative. 14 point ROS was done and is otherwise as detailed above or in HPI  PHYSICAL EXAMINATION: ECOG PERFORMANCE STATUS: 2 - Symptomatic, <50% confined to bed  Vitals:   03/04/17 1426  BP: 98/61    Pulse: 72  Resp: 16  Temp: 98.4 F (36.9 C)  SpO2: 100%   Filed Weights   03/04/17 1426  Weight: 104 lb (47.2 kg)    Physical Exam  Constitutional: She is oriented to person, place, and time and well-developed, well-nourished, and in no distress.  HENT:  Head: Normocephalic and atraumatic.  Nose: Nose normal.  Mouth/Throat: Oropharynx is clear and moist. No oropharyngeal exudate.  Eyes: Conjunctivae and EOM are normal. Pupils are equal, round, and reactive to light. Right eye exhibits no discharge. Left eye exhibits no discharge. No scleral icterus.  Neck: Normal range of motion. Neck supple. No tracheal deviation present. No thyromegaly present.  Cardiovascular: Normal rate, regular rhythm and normal heart sounds. Exam reveals no gallop and no friction rub.  No murmur heard. Pulmonary/Chest: Effort normal and breath sounds normal. She has no wheezes. She has no rales.  Abdominal: Soft. Bowel sounds are normal. She exhibits no distension and no mass. There is no tenderness. There is no rebound and no guarding.  Musculoskeletal: Normal range of motion. She exhibits no edema.  Lymphadenopathy:    She has no cervical adenopathy.  Neurological: She is alert and oriented to person, place, and time. No cranial nerve deficit. Gait normal. Coordination normal.  Skin: Skin is warm and dry. No rash noted.  Psychiatric: Mood, memory, affect and judgment normal.  Nursing note and vitals reviewed.  LABORATORY DATA:  I have reviewed the data as listed Lab Results  Component Value Date   WBC 5.7 03/04/2017   HGB 8.6 (L) 03/04/2017   HCT 26.5 (L) 03/04/2017   MCV 122.1 (H) 03/04/2017   PLT 431 (H) 03/04/2017   CMP     Component Value Date/Time   NA 139 02/15/2017 1305   K 3.7 02/15/2017 1305   CL 104 02/15/2017 1305   CO2 25 02/15/2017 1305   GLUCOSE 111 (H) 02/15/2017 1305   BUN 18 02/15/2017 1305   CREATININE 1.39 (H) 02/15/2017 1305   CALCIUM 9.2 02/15/2017 1305   PROT 7.0  02/15/2017 1305   ALBUMIN 4.0 02/15/2017 1305   AST 27 02/15/2017 1305   ALT 11 (L) 02/15/2017 1305   ALKPHOS 57 02/15/2017 1305   BILITOT 0.7 02/15/2017 1305   GFRNONAA 38 (L) 02/15/2017 1305   GFRAA 44 (L) 02/15/2017 1305    RADIOGRAPHIC STUDIES: I have personally reviewed the radiological images as listed and agreed with the findings in the report.  Initial PET scan 05/25/2016 IMPRESSION: 1. No findings of active malignancy in the neck, chest, abdomen, or pelvis. The 3 liver lesions are not hypermetabolic. Moreover, I suspect that they may be subtly present on the prior CT chest from 10/23/2013, further reassuring that these are likely benign. 2. The ascending aorta measures 4.2 cm in diameter. Recommend annual imaging followup by CTA or MRA. This recommendation follows 2010 ACCF/AHA/AATS/ACR/ASA/SCA/SCAI/SIR/STS/SVM Guidelines for the Diagnosis and Management of Patients with Thoracic Aortic Disease. Circulation. 2010; 121: Z610-R604 3. Other imaging findings of potential clinical significance: Coronary, aortic arch, and branch vessel atherosclerotic vascular disease. Prominent emphysema. Several subpleural nodules along the major fissures have no significant accentuated metabolic activity and are probably benign. Right adrenal adenoma. 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 Postoperative findings in the lumbar spine.  CT Angio 05/10/2016 IMPRESSION: There are 3 small hypodense lesions of the right liver lobe, as above, concerning for metastatic disease. Further evaluation with PET-CT or contrast-enhanced MRI may be considered.  Small lung nodules are again demonstrated, measuring smaller than the comparison chest CT, demonstrating benign behavior.  Paraseptal and centrilobular emphysema.  Aortic atherosclerosis and coronary artery disease. No evidence of significant stenosis at the origin of any of the mesenteric vessels, renal vessels, or the iliac vasculature. No  abdominal aortic aneurysm.  Calcifications of the pancreas compatible with chronic pancreatitis.  Diverticular disease without evidence of acute diverticulitis.  Right adrenal adenoma again noted.  Nodule of the medial limb left adrenal gland, indeterminate, measuring 12 mm in greatest diameter.  Upper abdominal lymph nodes in the periaortic/preaortic nodal station, unchanged.  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  Genetic testing was negative for BRCA.  H/o [id number] Melanoma LLE -Melanoma of L upper/thigh buttocks.  S/P excision of lesion with clear margins identifying a Breslow depth of 1.02 mm, pT2A  Pernicious anemia- requiring B12 replacement.  On IM B12 injections, 1000 mcg, monthly- administered at home.   PLAN: Reviewed patient's lab workup and endoscopy results with her. She has no evidence of GI bleeding.   Hemoglobin has increased since she's been off of hydrea for the past 2 weeks. I have advised her to hold her hydrea for another week to allow her to recover, and thereafter to start hydrea 564m M-F and 1000 mg Sat-Sun. Goal is to maintain her platelets less than 450k, however this may be too stringent for the patient since it may cause her to be too anemia. Continue to monitor her bloodwork.  I encouraged annual dermatology visits due to her history of melanoma.   Annual mammograms.  She will return for follow up in 2  months with labs; CBC, CMP  ORDERS PLACED FOR THIS ENCOUNTER: Orders Placed This Encounter  Procedures  . CBC with Differential  . Comprehensive metabolic panel   Orders Placed This Encounter  Procedures  . CBC with Differential    Standing Status:   Future     Standing Expiration Date:   03/04/2018  . Comprehensive metabolic panel    Standing Status:   Future    Standing Expiration Date:   03/04/2018   All questions were answered. The patient knows to call the clinic with any problems, questions or concerns.    This note was electronically signed.    LTwana First MD  03/04/2017 3:39 PM

## 2017-03-19 ENCOUNTER — Ambulatory Visit (INDEPENDENT_AMBULATORY_CARE_PROVIDER_SITE_OTHER): Payer: Medicare Other | Admitting: Pulmonary Disease

## 2017-03-19 ENCOUNTER — Encounter: Payer: Self-pay | Admitting: Pulmonary Disease

## 2017-03-19 DIAGNOSIS — R0602 Shortness of breath: Secondary | ICD-10-CM | POA: Diagnosis not present

## 2017-03-19 MED ORDER — UMECLIDINIUM-VILANTEROL 62.5-25 MCG/INH IN AEPB: 1.0000 | INHALATION_SPRAY | Freq: Every day | RESPIRATORY_TRACT | 0 refills | Status: AC

## 2017-03-19 MED ORDER — UMECLIDINIUM-VILANTEROL 62.5-25 MCG/INH IN AEPB: 1.0000 | INHALATION_SPRAY | Freq: Every day | RESPIRATORY_TRACT | 6 refills | Status: DC

## 2017-03-19 NOTE — Patient Instructions (Signed)
We will schedule you for a high-resolution CT and pulmonary function test. Give sample of Anoro inhaler to help with her breathing.  Use this in place of Advair We will check your oxygen levels on exertion today Follow-up in 1-2 months.

## 2017-03-19 NOTE — Progress Notes (Signed)
Ashley [name]    [id number]    [birth year]  Primary Care Physician:Strader, Laurita Quint, NP  Referring Physician: [name], NP [address] [place], [place] [address]  Chief complaint: Consult for dyspnea  HPI: 66 year old with past medical history of emphysema, GERD, hypertension, hyperlipidemia, carcinoma of the breast.  She has complaints of dyspnea on exertion for the past 1 year.  Symptoms started after lumbar surgery a year ago.  She does not have much symptoms at rest.  Denies any cough, sputum production, wheezing, hemoptysis.  She is currently on Advair but cannot tell if this is helping with her breathing.  Pets: 2 cats and a dog Occupation: Retired Marine scientist Exposures: No known exposures Smoking history: 25-pack-year smoking history.  Continues to smoke half pack per day Travel History: No recent travel  Outpatient Encounter Medications as of 03/19/2017  Medication Sig  . aspirin EC 81 MG tablet Take 81 mg by mouth daily.  . Calcium Carb-Cholecalciferol (CALCIUM 600+D) 600-800 MG-UNIT TABS Take 1 tablet by mouth 2 (two) times daily.  . Cholecalciferol (VITAMIN D-3) 1000 units CAPS Take 1,000 Units daily by mouth.   . clonazePAM (KLONOPIN) 0.5 MG tablet TAKE (1) TABLET BY MOUTH AT BEDTIME.  . cyanocobalamin (,VITAMIN B-12,) 1000 MCG/ML injection INJECT 1 ML INTO THE MUSCLE ONCE MONTHLY AS DIRECTED.  Marland Kitchen Fluticasone-Salmeterol (ADVAIR DISKUS) 100-50 MCG/DOSE AEPB Inhale 1 puff 2 (two) times daily as needed into the lungs for shortness of breath.  . furosemide (LASIX) 40 MG tablet Take 40 mg daily by mouth.   . gabapentin (NEURONTIN) 300 MG capsule Take 300 mg by mouth at bedtime.  . hydroxyurea (HYDREA) 500 MG capsule Take 500-1,000 mg by mouth See admin instructions. 500 mg once daily Mondays thru friday and 1000 mg once daily on Saturday and Sunday.  . ibandronate (BONIVA) 150 MG tablet Take 1 tablet by mouth every 30 (thirty) days.  Marland Kitchen ibuprofen (ADVIL,MOTRIN) 800 MG  tablet Take 800 mg every 8 (eight) hours as needed by mouth for moderate pain.  Marland Kitchen ipratropium-albuterol (DUONEB) 0.5-2.5 (3) MG/3ML SOLN Take 3 mLs by nebulization every 6 (six) hours as needed (shortness of breath).  . irbesartan (AVAPRO) 75 MG tablet Take 75 mg at bedtime by mouth.   . methocarbamol (ROBAXIN) 500 MG tablet Take 500 mg daily as needed by mouth for muscle spasms.   . pantoprazole (PROTONIX) 40 MG tablet Take 40 mg daily by mouth.  . potassium chloride SA (K-DUR,KLOR-CON) 20 MEQ tablet Take 1 tablet (20 mEq total) by mouth 3 (three) times daily.  . rosuvastatin (CRESTOR) 40 MG tablet Take 40 mg at bedtime by mouth.   . traMADol (ULTRAM) 50 MG tablet Take 50-100 mg by mouth 3 (three) times daily as needed (pain).   . [DISCONTINUED] meloxicam (MOBIC) 15 MG tablet Take 15 mg at bedtime by mouth.    No facility-administered encounter medications on file as of 03/19/2017.     Allergies as of 03/19/2017 - Review Complete 03/19/2017  Allergen Reaction Noted  . Penicillins Hives and Rash 12/14/2011  . Tape Rash 12/26/2015    Past Medical History:  Diagnosis Date  . Adenocarcinoma of left breast ([place]) 01/09/2016  . Arthritis   . Ascending aortic aneurysm ([employer])   . Cancer [facility]) 1995   breast, left, mastectomy/chemo  . Chest pain    Possibly cardiac. No evidence of ischemia/injury based upon normal troponin I. Chest discomfort could be tachycardia induced supply demand  mismatch.   . CHF (congestive heart failure) ([place]) 11/17/2015   after surgery   . Colon adenomas   . Coronary artery disease   . Dyspnea    with exertion  . Emphysema of lung ([place]) 11/17/2015  . Essential hypertension, benign   . GERD (gastroesophageal reflux disease)   . Hyperlipidemia   . Hypertension   . Melanoma ([place]) 01/09/2016  . Palpitations   . Pernicious anemia 03/06/2016  . Pure hypercholesterolemia   . Thrombocythemia, essential ([place]) 01/09/2016  . Thrombocytosis ([place])    Idiopathic    Past  Surgical History:  Procedure Laterality Date  . ANTERIOR AND POSTERIOR REPAIR N/A 12/09/2014   Procedure: ANTERIOR (CYSTOCELE) AND POSTERIOR REPAIR (RECTOCELE);  Surgeon: Bjorn Loser, MD;  Location: [facility];  Service: Urology;  Laterality: N/A;  . ANTERIOR CERVICAL DECOMPRESSION/DISCECTOMY FUSION 4 LEVELS Right 10/03/2016   Procedure: ANTERIOR CERVICAL DECOMPRESSION FUSION, CERVICAL 4-5, CERVICAL 5-6, CERVICAL 6-7, CERVICAL 7 TO THORACIC 1 WITH INSTRUMENTATION AND ALLOGRAFT;  Surgeon: Phylliss Bob, MD;  Location: [facility];  Service: Orthopedics;  Laterality: Right;  ANTERIOR CERVICAL DECOMPRESSION FUSION, CERVICAL 4-5, CERVICAL 5-6, CERVICAL 6-7, CERVICAL 7 TO THORACIC 1 WITH INSTRUMENTATION AND ALLOGRAFT; REQUEST 4 HO  . ANTERIOR LAT LUMBAR FUSION Left 11/16/2015   Procedure: LEFT SIDED LATERAL INTERBODY FUSION, LUMBAR 2-3, LUMBAR 3-4, LUMBAR 4-5 WITH INSTRUMENTATION;  Surgeon: Phylliss Bob, MD;  Location: [facility];  Service: Orthopedics;  Laterality: Left;  LEFT SIDED LATEARL INTERBODY FUSION, LUMBAR 2-3, LUMBAR 3-4, LUMBAR 4-5 WITH INSTRUMENTATION   . APPENDECTOMY    . BONE MARROW ASPIRATION  07/2012  . BONE MARROW BIOPSY  07/2012  . BREAST SURGERY    . CARDIAC CATHETERIZATION    . CARDIAC CATHETERIZATION N/A 01/20/2016   Procedure: Left Heart Cath and Coronary Angiography;  Surgeon: Burnell Blanks, MD;  Location: [facility];  Service: Cardiovascular;  Laterality: N/A;  . COLONOSCOPY  11/29/2010   Procedure: COLONOSCOPY;  Surgeon: Rogene Houston, MD;  [facility];  Service: Endoscopy;  Laterality: N/A;  . COLONOSCOPY N/A 02/18/2014   Procedure: COLONOSCOPY;  Surgeon: Rogene Houston, MD;  [facility];  Service: Endoscopy;  Laterality: N/A;  1030  . COLONOSCOPY N/A 02/25/2017   Procedure: COLONOSCOPY;  Surgeon: Rogene Houston, MD;  [facility];  Service: Endoscopy;  Laterality: N/A;  10:55  . CYSTOSCOPY N/A 12/09/2014   Procedure: CYSTOSCOPY;   Surgeon: Bjorn Loser, MD;  Location: [facility];  Service: Urology;  Laterality: N/A;  . ESOPHAGEAL DILATION N/A 02/25/2017   Procedure: ESOPHAGEAL DILATION;  Surgeon: Rogene Houston, MD;  [facility];  Service: Endoscopy;  Laterality: N/A;  . ESOPHAGOGASTRODUODENOSCOPY N/A 02/25/2017   Procedure: ESOPHAGOGASTRODUODENOSCOPY (EGD);  Surgeon: Rogene Houston, MD;  [facility];  Service: Endoscopy;  Laterality: N/A;  . IR GENERIC HISTORICAL  01/11/2016   IR RADIOLOGY PERIPHERAL GUIDED IV START 01/11/2016 Saverio Danker, PA-C [facility]  . IR GENERIC HISTORICAL  01/11/2016   IR US GUIDE VASC ACCESS RIGHT 01/11/2016 Saverio Danker, PA-C [facility]  . MASTECTOMY     left  . OVARIAN CYST SURGERY     x2  . POLYPECTOMY  02/25/2017   Procedure: POLYPECTOMY;  Surgeon: Rogene Houston, MD;  [facility];  Service: Endoscopy;;  . SALPINGOOPHORECTOMY Bilateral 12/09/2014   Procedure: SALPINGO OOPHORECTOMY;  Surgeon: Servando Salina, MD;  Location: [facility];  Service: Gynecology;  Laterality: Bilateral;  . TUBAL LIGATION    .  VAGINAL HYSTERECTOMY N/A 12/09/2014   Procedure: HYSTERECTOMY VAGINAL;  Surgeon: Servando Salina, MD;  Location: [facility];  Service: Gynecology;  Laterality: N/A;    Family History  Problem Relation Age of Onset  . Hypertension Mother   . Heart failure Mother   . Congestive Heart Failure Mother   . COPD Mother   . Pernicious anemia Mother   . Cancer Mother        lung  . Hypertension Father   . CAD Father   . Heart attack Father   . Hypertension Sister   . Cancer Other   . Celiac disease Other     Social History   Socioeconomic History  . Marital status: Divorced    Spouse name: Not on file  . Number of children: 2  . Years of education: Not on file  . Highest education level: Not on file  Social Needs  . Financial resource strain: Not on file  . Food insecurity - worry: Not on file  . Food insecurity - inability: Not on  file  . Transportation needs - medical: Not on file  . Transportation needs - non-medical: Not on file  Occupational History  . Not on file  Tobacco Use  . Smoking status: Current Every Day Smoker    Packs/day: 0.50    Years: 50.00    Pack years: 25.00    Types: Cigarettes  . Smokeless tobacco: Never Used  Substance and Sexual Activity  . Alcohol use: No  . Drug use: No  . Sexual activity: Yes    Birth control/protection: Post-menopausal  Other Topics Concern  . Not on file  Social History Narrative  . Not on file    Review of systems: Review of Systems  Constitutional: Negative for fever and chills.  HENT: Negative.   Eyes: Negative for blurred vision.  Respiratory: as per HPI  Cardiovascular: Negative for chest pain and palpitations.  Gastrointestinal: Negative for vomiting, diarrhea, blood per rectum. Genitourinary: Negative for dysuria, urgency, frequency and hematuria.  Musculoskeletal: Negative for myalgias, back pain and joint pain.  Skin: Negative for itching and rash.  Neurological: Negative for dizziness, tremors, focal weakness, seizures and loss of consciousness.  Endo/Heme/Allergies: Negative for environmental allergies.  Psychiatric/Behavioral: Negative for depression, suicidal ideas and hallucinations.  All other systems reviewed and are negative.  Physical Exam: Blood pressure 102/62, pulse 87, height 5' 2" (1.575 m), weight 103 lb 3.2 oz (46.8 kg), SpO2 98 %. Gen:      No acute distress HEENT:  EOMI, sclera anicteric Neck:     No masses; no thyromegaly Lungs:    Clear to auscultation bilaterally; normal respiratory effort CV:         Regular rate and rhythm; no murmurs Abd:      + bowel sounds; soft, non-tender; no palpable masses, no distension Ext:    No edema; adequate peripheral perfusion Skin:      Warm and dry; no rash Neuro: alert and oriented x 3 Psych: normal mood and affect  Data Reviewed: Spirometry 02/01/17 FVC 3.19 [114%], FEV1 2.11  [99%], F/F 66 Mild obstruction  CT chest 01/21/17-moderate-severe emphysematous changes, bibasilar atelectasis/scarring.  I have reviewed the images personally  CBC 03/04/17- Absolute eos count 285  Assessment:  COPD She has emphysematous changes in the CT scan and obstruction on recent spirometry Advair does not seem to be helping.  We will switch her to [facility] Schedule for PFTs.  Follow oxygen levels on exertion  Intersitial lung disease  CT scan reviewed with chronic interstitial changes at the bases that appears to have progressed over the years.  Will get a high-resolution CT for better evaluation  Active smoker Smoking cessation discussed.  Plan/Recommendations: - Stop advair, start anoro - PFTs - High res CT - Smoking cessation.  Marshell Garfinkel MD [facility] Pager [phone] 03/19/2017, 4:36 PM  CC: [name], NP

## 2017-03-29 ENCOUNTER — Ambulatory Visit (HOSPITAL_COMMUNITY)
Admission: RE | Admit: 2017-03-29 | Discharge: 2017-03-29 | Disposition: A | Discharge status: Home / Self Care | Payer: Medicare Other | Source: Ambulatory Visit | Attending: Pulmonary Disease | Admitting: Pulmonary Disease

## 2017-03-29 DIAGNOSIS — R0602 Shortness of breath: Secondary | ICD-10-CM | POA: Diagnosis present

## 2017-03-29 DIAGNOSIS — J438 Other emphysema: Secondary | ICD-10-CM | POA: Insufficient documentation

## 2017-03-29 DIAGNOSIS — I2510 Atherosclerotic heart disease of native coronary artery without angina pectoris: Secondary | ICD-10-CM | POA: Diagnosis not present

## 2017-03-29 DIAGNOSIS — I700 Atherosclerosis of aorta: Secondary | ICD-10-CM | POA: Insufficient documentation

## 2017-03-29 DIAGNOSIS — J432 Centrilobular emphysema: Secondary | ICD-10-CM | POA: Diagnosis not present

## 2017-03-29 DIAGNOSIS — R918 Other nonspecific abnormal finding of lung field: Secondary | ICD-10-CM | POA: Insufficient documentation

## 2017-03-29 DIAGNOSIS — I712 Thoracic aortic aneurysm, without rupture: Secondary | ICD-10-CM | POA: Insufficient documentation

## 2017-03-29 DIAGNOSIS — I358 Other nonrheumatic aortic valve disorders: Secondary | ICD-10-CM | POA: Diagnosis not present

## 2017-04-04 ENCOUNTER — Other Ambulatory Visit (HOSPITAL_COMMUNITY): Payer: Self-pay | Admitting: Emergency Medicine

## 2017-04-04 MED ORDER — HYDROXYUREA 500 MG PO CAPS: ORAL_CAPSULE | ORAL | 1 refills | Status: DC

## 2017-04-10 ENCOUNTER — Other Ambulatory Visit (HOSPITAL_COMMUNITY): Payer: Self-pay | Admitting: Adult Health

## 2017-04-10 DIAGNOSIS — C50912 Malignant neoplasm of unspecified site of left female breast: Secondary | ICD-10-CM

## 2017-04-11 ENCOUNTER — Encounter (HOSPITAL_COMMUNITY): Payer: Self-pay | Admitting: Adult Health

## 2017-04-11 ENCOUNTER — Other Ambulatory Visit (HOSPITAL_COMMUNITY): Payer: Self-pay | Admitting: Adult Health

## 2017-04-11 DIAGNOSIS — C50912 Malignant neoplasm of unspecified site of left female breast: Secondary | ICD-10-CM

## 2017-04-11 MED ORDER — CLONAZEPAM 0.5 MG PO TABS: ORAL_TABLET | ORAL | 3 refills | Status: DC

## 2017-04-11 NOTE — Progress Notes (Signed)
Received refill request from pharmacy for Clonazepam.    Controlled Substance Reporting System reviewed and refill is appropriate on or after 04/11/17. Paper prescription printed & post-dated. Will ask nursing to fax Rx to pt's pharmacy.   [place]CC[place] reviewed:     Mike Craze, NP [place] [phone]

## 2017-04-30 ENCOUNTER — Other Ambulatory Visit: Payer: Self-pay

## 2017-04-30 DIAGNOSIS — R0600 Dyspnea, unspecified: Secondary | ICD-10-CM

## 2017-05-01 ENCOUNTER — Inpatient Hospital Stay (HOSPITAL_COMMUNITY): Payer: Medicare Other | Attending: Oncology

## 2017-05-01 ENCOUNTER — Inpatient Hospital Stay (HOSPITAL_BASED_OUTPATIENT_CLINIC_OR_DEPARTMENT_OTHER): Payer: Medicare Other | Admitting: Hematology and Oncology

## 2017-05-01 ENCOUNTER — Ambulatory Visit (INDEPENDENT_AMBULATORY_CARE_PROVIDER_SITE_OTHER): Payer: Medicare Other | Admitting: Pulmonary Disease

## 2017-05-01 ENCOUNTER — Encounter (HOSPITAL_COMMUNITY): Payer: Self-pay | Admitting: Hematology and Oncology

## 2017-05-01 ENCOUNTER — Encounter: Payer: Self-pay | Admitting: Pulmonary Disease

## 2017-05-01 ENCOUNTER — Other Ambulatory Visit: Payer: Self-pay

## 2017-05-01 VITALS — BP 102/60 | HR 86 | Ht 62.0 in | Wt 104.0 lb

## 2017-05-01 DIAGNOSIS — D473 Essential (hemorrhagic) thrombocythemia: Secondary | ICD-10-CM | POA: Diagnosis present

## 2017-05-01 DIAGNOSIS — I2510 Atherosclerotic heart disease of native coronary artery without angina pectoris: Secondary | ICD-10-CM | POA: Diagnosis not present

## 2017-05-01 DIAGNOSIS — Z853 Personal history of malignant neoplasm of breast: Secondary | ICD-10-CM | POA: Insufficient documentation

## 2017-05-01 DIAGNOSIS — Z79899 Other long term (current) drug therapy: Secondary | ICD-10-CM

## 2017-05-01 DIAGNOSIS — J439 Emphysema, unspecified: Secondary | ICD-10-CM | POA: Insufficient documentation

## 2017-05-01 DIAGNOSIS — Z9012 Acquired absence of left breast and nipple: Secondary | ICD-10-CM | POA: Insufficient documentation

## 2017-05-01 DIAGNOSIS — Z85820 Personal history of malignant melanoma of skin: Secondary | ICD-10-CM | POA: Insufficient documentation

## 2017-05-01 DIAGNOSIS — F17210 Nicotine dependence, cigarettes, uncomplicated: Secondary | ICD-10-CM | POA: Insufficient documentation

## 2017-05-01 DIAGNOSIS — I509 Heart failure, unspecified: Secondary | ICD-10-CM | POA: Diagnosis not present

## 2017-05-01 DIAGNOSIS — Z170 Estrogen receptor positive status [ER+]: Secondary | ICD-10-CM | POA: Diagnosis not present

## 2017-05-01 DIAGNOSIS — D510 Vitamin B12 deficiency anemia due to intrinsic factor deficiency: Secondary | ICD-10-CM | POA: Diagnosis not present

## 2017-05-01 DIAGNOSIS — Z9221 Personal history of antineoplastic chemotherapy: Secondary | ICD-10-CM | POA: Diagnosis not present

## 2017-05-01 DIAGNOSIS — Z7982 Long term (current) use of aspirin: Secondary | ICD-10-CM | POA: Diagnosis not present

## 2017-05-01 DIAGNOSIS — E7800 Pure hypercholesterolemia, unspecified: Secondary | ICD-10-CM | POA: Insufficient documentation

## 2017-05-01 DIAGNOSIS — R0600 Dyspnea, unspecified: Secondary | ICD-10-CM | POA: Diagnosis not present

## 2017-05-01 DIAGNOSIS — I712 Thoracic aortic aneurysm, without rupture: Secondary | ICD-10-CM | POA: Insufficient documentation

## 2017-05-01 DIAGNOSIS — I110 Hypertensive heart disease with heart failure: Secondary | ICD-10-CM | POA: Diagnosis not present

## 2017-05-01 DIAGNOSIS — C4372 Malignant melanoma of left lower limb, including hip: Secondary | ICD-10-CM

## 2017-05-01 DIAGNOSIS — J449 Chronic obstructive pulmonary disease, unspecified: Secondary | ICD-10-CM | POA: Diagnosis not present

## 2017-05-01 DIAGNOSIS — C50912 Malignant neoplasm of unspecified site of left female breast: Secondary | ICD-10-CM

## 2017-05-01 DIAGNOSIS — R0602 Shortness of breath: Secondary | ICD-10-CM | POA: Diagnosis not present

## 2017-05-01 DIAGNOSIS — K219 Gastro-esophageal reflux disease without esophagitis: Secondary | ICD-10-CM | POA: Diagnosis not present

## 2017-05-01 LAB — COMPREHENSIVE METABOLIC PANEL
ALBUMIN: 3.8 g/dL (ref 3.5–5.0)
ALK PHOS: 57 U/L (ref 38–126)
ALT: 16 U/L (ref 14–54)
ANION GAP: 11 (ref 5–15)
AST: 27 U/L (ref 15–41)
BUN: 16 mg/dL (ref 6–20)
CALCIUM: 8.8 mg/dL — AB (ref 8.9–10.3)
CO2: 24 mmol/L (ref 22–32)
CREATININE: 1.44 mg/dL — AB (ref 0.44–1.00)
Chloride: 102 mmol/L (ref 101–111)
GFR calc Af Amer: 42 mL/min — ABNORMAL LOW (ref >60)
GFR calc non Af Amer: 36 mL/min — ABNORMAL LOW (ref >60)
GLUCOSE: 121 mg/dL — AB (ref 65–99)
Potassium: 3.5 mmol/L (ref 3.5–5.1)
Sodium: 137 mmol/L (ref 135–145)
Total Bilirubin: 0.3 mg/dL (ref 0.3–1.2)
Total Protein: 7.1 g/dL (ref 6.5–8.1)

## 2017-05-01 LAB — PULMONARY FUNCTION TEST
DL/VA % PRED: 47 %
DL/VA: 2.08 ml/min/mmHg/L
DLCO COR % PRED: 53 %
DLCO COR: 10.81 ml/min/mmHg
DLCO unc % pred: 47 %
DLCO unc: 9.66 ml/min/mmHg
FEF 25-75 POST: 1.27 L/s
FEF 25-75 Pre: 1.31 L/sec
FEF2575-%Change-Post: -2 %
FEF2575-%PRED-POST: 71 %
FEF2575-%PRED-PRE: 72 %
FEV1-%Change-Post: 0 %
FEV1-%Pred-Post: 110 %
FEV1-%Pred-Pre: 110 %
FEV1-Post: 2.24 L
FEV1-Pre: 2.24 L
FEV1FVC-%CHANGE-POST: 2 %
FEV1FVC-%PRED-PRE: 89 %
FEV6-%Change-Post: -2 %
FEV6-%PRED-PRE: 126 %
FEV6-%Pred-Post: 122 %
FEV6-Post: 3.14 L
FEV6-Pre: 3.23 L
FEV6FVC-%Change-Post: 1 %
FEV6FVC-%Pred-Post: 104 %
FEV6FVC-%Pred-Pre: 102 %
FVC-%Change-Post: -2 %
FVC-%PRED-POST: 119 %
FVC-%PRED-PRE: 122 %
FVC-POST: 3.2 L
FVC-PRE: 3.27 L
POST FEV1/FVC RATIO: 70 %
PRE FEV1/FVC RATIO: 68 %
Post FEV6/FVC ratio: 100 %
Pre FEV6/FVC Ratio: 99 %
RV % pred: 127 %
RV: 2.56 L
TLC % pred: 121 %
TLC: 5.58 L

## 2017-05-01 LAB — CBC WITH DIFFERENTIAL/PLATELET
BASOS PCT: 0 %
Basophils Absolute: 0 10*3/uL (ref 0.0–0.1)
Eosinophils Absolute: 0.2 10*3/uL (ref 0.0–0.7)
Eosinophils Relative: 3 %
HEMATOCRIT: 29.9 % — AB (ref 36.0–46.0)
HEMOGLOBIN: 9.7 g/dL — AB (ref 12.0–15.0)
LYMPHS ABS: 1.6 10*3/uL (ref 0.7–4.0)
Lymphocytes Relative: 23 %
MCH: 36.2 pg — AB (ref 26.0–34.0)
MCHC: 32.4 g/dL (ref 30.0–36.0)
MCV: 111.6 fL — ABNORMAL HIGH (ref 78.0–100.0)
MONOS PCT: 7 %
Monocytes Absolute: 0.5 10*3/uL (ref 0.1–1.0)
NEUTROS ABS: 4.6 10*3/uL (ref 1.7–7.7)
NEUTROS PCT: 67 %
Platelets: 435 10*3/uL — ABNORMAL HIGH (ref 150–400)
RBC: 2.68 MIL/uL — AB (ref 3.87–5.11)
RDW: 14.7 % (ref 11.5–15.5)
WBC: 6.9 10*3/uL (ref 4.0–10.5)

## 2017-05-01 MED ORDER — ALBUTEROL SULFATE HFA 108 (90 BASE) MCG/ACT IN AERS: 2.0000 | INHALATION_SPRAY | Freq: Four times a day (QID) | RESPIRATORY_TRACT | 2 refills | Status: AC | PRN

## 2017-05-01 NOTE — Progress Notes (Signed)
Ashley [name]    [id number]    Jun 30, 1950  Primary Care Physician:Strader, Laurita Quint, NP  Referring Physician: [name], NP [address] [place], [place] [address]  Chief complaint: Follow up for COPD  HPI: 67 year old with past medical history of emphysema, GERD, hypertension, hyperlipidemia, carcinoma of the breast.  She has complaints of dyspnea on exertion for the past 1 year.  Symptoms started after lumbar surgery a year ago.  She does not have much symptoms at rest.  Denies any cough, sputum production, wheezing, hemoptysis.  She is currently on Advair but cannot tell if this is helping with her breathing.  Pets: 2 cats and a dog  Occupation: Retired Marine scientist Exposures: No known exposures Smoking history: 25-pack-year smoking history.  Continues to smoke half pack per day Travel History: No recent travel  Interim history: Advair was stopped and she was given samples of Anoro at last visit.  She feels that did not make any difference with her breathing.  Dyspnea is overall improved.  She is not on any inhalers and feels she does not need them.  Outpatient Encounter Medications as of 05/01/2017  Medication Sig  . aspirin EC 81 MG tablet Take 81 mg by mouth daily.  . Calcium Carb-Cholecalciferol (CALCIUM 600+D) 600-800 MG-UNIT TABS Take 1 tablet by mouth 2 (two) times daily.  . Cholecalciferol (VITAMIN D-3) 1000 units CAPS Take 1,000 Units daily by mouth.   . clonazePAM (KLONOPIN) 0.5 MG tablet TAKE (1) TABLET BY MOUTH AT BEDTIME.  . cyanocobalamin (,VITAMIN B-12,) 1000 MCG/ML injection INJECT 1 ML INTO THE MUSCLE ONCE MONTHLY AS DIRECTED.  . furosemide (LASIX) 40 MG tablet Take 40 mg daily by mouth.   . gabapentin (NEURONTIN) 300 MG capsule Take 300 mg by mouth at bedtime.  . hydroxyurea (HYDREA) 500 MG capsule 500 mg once daily Mondays thru friday and 1000 mg once daily on Saturday and Sunday.  . ibandronate (BONIVA) 150 MG tablet Take 1 tablet by mouth every 30  (thirty) days.  Marland Kitchen ibuprofen (ADVIL,MOTRIN) 800 MG tablet Take 800 mg every 8 (eight) hours as needed by mouth for moderate pain.  Marland Kitchen ipratropium-albuterol (DUONEB) 0.5-2.5 (3) MG/3ML SOLN Take 3 mLs by nebulization every 6 (six) hours as needed (shortness of breath).  . irbesartan (AVAPRO) 75 MG tablet Take 75 mg at bedtime by mouth.   . methocarbamol (ROBAXIN) 500 MG tablet Take 500 mg daily as needed by mouth for muscle spasms.   . pantoprazole (PROTONIX) 40 MG tablet Take 40 mg daily by mouth.  . potassium chloride SA (K-DUR,KLOR-CON) 20 MEQ tablet Take 1 tablet (20 mEq total) by mouth 3 (three) times daily.  . rosuvastatin (CRESTOR) 40 MG tablet Take 40 mg at bedtime by mouth.   . traMADol (ULTRAM) 50 MG tablet Take 50-100 mg by mouth 3 (three) times daily as needed (pain).   . [DISCONTINUED] Fluticasone-Salmeterol (ADVAIR DISKUS) 100-50 MCG/DOSE AEPB Inhale 1 puff 2 (two) times daily as needed into the lungs for shortness of breath.  . [DISCONTINUED] umeclidinium-vilanterol (ANORO ELLIPTA) 62.5-25 MCG/INH AEPB Inhale 1 puff into the lungs daily.   No facility-administered encounter medications on file as of 05/01/2017.     Allergies as of 05/01/2017 - Review Complete 05/01/2017  Allergen Reaction Noted  . Penicillins Hives and Rash 12/14/2011  . Tape Rash 12/26/2015    Past Medical History:  Diagnosis Date  . Adenocarcinoma of left breast ([place]) 01/09/2016  . Arthritis   .  Ascending aortic aneurysm ([place])   . Cancer [facility]) 1995   breast, left, mastectomy/chemo  . Chest pain    Possibly cardiac. No evidence of ischemia/injury based upon normal troponin I. Chest discomfort could be tachycardia induced supply demand mismatch.   . CHF (congestive heart failure) ([place]) 11/17/2015   after surgery   . Colon adenomas   . Coronary artery disease   . Dyspnea    with exertion  . Emphysema of lung ([place]) 11/17/2015  . Essential hypertension, benign   . GERD (gastroesophageal reflux disease)   .  Hyperlipidemia   . Hypertension   . Melanoma ([place]) 01/09/2016  . Palpitations   . Pernicious anemia 03/06/2016  . Pure hypercholesterolemia   . Thrombocythemia, essential ([place]) 01/09/2016  . Thrombocytosis ([place])    Idiopathic    Past Surgical History:  Procedure Laterality Date  . ANTERIOR AND POSTERIOR REPAIR N/A 12/09/2014   Procedure: ANTERIOR (CYSTOCELE) AND POSTERIOR REPAIR (RECTOCELE);  Surgeon: Bjorn Loser, MD;  Location: [facility];  Service: Urology;  Laterality: N/A;  . ANTERIOR CERVICAL DECOMPRESSION/DISCECTOMY FUSION 4 LEVELS Right 10/03/2016   Procedure: ANTERIOR CERVICAL DECOMPRESSION FUSION, CERVICAL 4-5, CERVICAL 5-6, CERVICAL 6-7, CERVICAL 7 TO THORACIC 1 WITH INSTRUMENTATION AND ALLOGRAFT;  Surgeon: Phylliss Bob, MD;  Location: [facility];  Service: Orthopedics;  Laterality: Right;  ANTERIOR CERVICAL DECOMPRESSION FUSION, CERVICAL 4-5, CERVICAL 5-6, CERVICAL 6-7, CERVICAL 7 TO THORACIC 1 WITH INSTRUMENTATION AND ALLOGRAFT; REQUEST 4 HO  . ANTERIOR LAT LUMBAR FUSION Left 11/16/2015   Procedure: LEFT SIDED LATERAL INTERBODY FUSION, LUMBAR 2-3, LUMBAR 3-4, LUMBAR 4-5 WITH INSTRUMENTATION;  Surgeon: Phylliss Bob, MD;  Location: [facility];  Service: Orthopedics;  Laterality: Left;  LEFT SIDED LATEARL INTERBODY FUSION, LUMBAR 2-3, LUMBAR 3-4, LUMBAR 4-5 WITH INSTRUMENTATION   . APPENDECTOMY    . BONE MARROW ASPIRATION  07/2012  . BONE MARROW BIOPSY  07/2012  . BREAST SURGERY    . CARDIAC CATHETERIZATION    . CARDIAC CATHETERIZATION N/A 01/20/2016   Procedure: Left Heart Cath and Coronary Angiography;  Surgeon: Burnell Blanks, MD;  Location: [facility];  Service: Cardiovascular;  Laterality: N/A;  . COLONOSCOPY  11/29/2010   Procedure: COLONOSCOPY;  Surgeon: Rogene Houston, MD;  [facility];  Service: Endoscopy;  Laterality: N/A;  . COLONOSCOPY N/A 02/18/2014   Procedure: COLONOSCOPY;  Surgeon: Rogene Houston, MD;  [facility];  Service: Endoscopy;   Laterality: N/A;  1030  . COLONOSCOPY N/A 02/25/2017   Procedure: COLONOSCOPY;  Surgeon: Rogene Houston, MD;  [facility];  Service: Endoscopy;  Laterality: N/A;  10:55  . CYSTOSCOPY N/A 12/09/2014   Procedure: CYSTOSCOPY;  Surgeon: Bjorn Loser, MD;  Location: [facility];  Service: Urology;  Laterality: N/A;  . ESOPHAGEAL DILATION N/A 02/25/2017   Procedure: ESOPHAGEAL DILATION;  Surgeon: Rogene Houston, MD;  [facility];  Service: Endoscopy;  Laterality: N/A;  . ESOPHAGOGASTRODUODENOSCOPY N/A 02/25/2017   Procedure: ESOPHAGOGASTRODUODENOSCOPY (EGD);  Surgeon: Rogene Houston, MD;  [facility];  Service: Endoscopy;  Laterality: N/A;  . IR GENERIC HISTORICAL  01/11/2016   IR RADIOLOGY PERIPHERAL GUIDED IV START 01/11/2016 Saverio Danker, PA-C [facility]  . IR GENERIC HISTORICAL  01/11/2016   IR US GUIDE VASC ACCESS RIGHT 01/11/2016 Saverio Danker, PA-C [facility]  . MASTECTOMY     left  . OVARIAN CYST SURGERY     x2  . POLYPECTOMY  02/25/2017   Procedure: POLYPECTOMY;  Surgeon:  Rogene Houston, MD;  [facility];  Service: Endoscopy;;  . SALPINGOOPHORECTOMY Bilateral 12/09/2014   Procedure: SALPINGO OOPHORECTOMY;  Surgeon: Servando Salina, MD;  Location: [facility];  Service: Gynecology;  Laterality: Bilateral;  . TUBAL LIGATION    . VAGINAL HYSTERECTOMY N/A 12/09/2014   Procedure: HYSTERECTOMY VAGINAL;  Surgeon: Servando Salina, MD;  Location: [facility];  Service: Gynecology;  Laterality: N/A;    Family History  Problem Relation Age of Onset  . Hypertension Mother   . Heart failure Mother   . Congestive Heart Failure Mother   . COPD Mother   . Pernicious anemia Mother   . Cancer Mother        lung  . Hypertension Father   . CAD Father   . Heart attack Father   . Hypertension Sister   . Cancer Other   . Celiac disease Other     Social History   Socioeconomic History  . Marital status: Divorced    Spouse name: Not on file    . Number of children: 2  . Years of education: Not on file  . Highest education level: Not on file  Social Needs  . Financial resource strain: Not on file  . Food insecurity - worry: Not on file  . Food insecurity - inability: Not on file  . Transportation needs - medical: Not on file  . Transportation needs - non-medical: Not on file  Occupational History  . Not on file  Tobacco Use  . Smoking status: Current Every Day Smoker    Packs/day: 0.50    Years: 50.00    Pack years: 25.00    Types: Cigarettes  . Smokeless tobacco: Never Used  Substance and Sexual Activity  . Alcohol use: No  . Drug use: No  . Sexual activity: Yes    Birth control/protection: Post-menopausal  Other Topics Concern  . Not on file  Social History Narrative  . Not on file    Review of systems: Review of Systems  Constitutional: Negative for fever and chills.  HENT: Negative.   Eyes: Negative for blurred vision.  Respiratory: as per HPI  Cardiovascular: Negative for chest pain and palpitations.  Gastrointestinal: Negative for vomiting, diarrhea, blood per rectum. Genitourinary: Negative for dysuria, urgency, frequency and hematuria.  Musculoskeletal: Negative for myalgias, back pain and joint pain.  Skin: Negative for itching and rash.  Neurological: Negative for dizziness, tremors, focal weakness, seizures and loss of consciousness.  Endo/Heme/Allergies: Negative for environmental allergies.  Psychiatric/Behavioral: Negative for depression, suicidal ideas and hallucinations.  All other systems reviewed and are negative.  Physical Exam: Blood pressure 102/62, pulse 87, height 5' 2" (1.575 m), weight 103 lb 3.2 oz (46.8 kg), SpO2 98 %. Gen:      No acute distress HEENT:  EOMI, sclera anicteric Neck:     No masses; no thyromegaly Lungs:    Clear to auscultation bilaterally; normal respiratory effort CV:         Regular rate and rhythm; no murmurs Abd:      + bowel sounds; soft, non-tender; no  palpable masses, no distension Ext:    No edema; adequate peripheral perfusion Skin:      Warm and dry; no rash Neuro: alert and oriented x 3 Psych: normal mood and affect  Data Reviewed: Spirometry 02/01/17 FVC 3.19 [114%], FEV1 2.11 [99%], F/F 66 Mild obstruction  PFTs 05/01/17 FVC 3.20 [119%], FEV1 2.24 [110%], F/F 70, TLC 121%, DLCO 47% Mild obstruction, reduced diffusion capacity.  CT chest 01/21/17-moderate-severe emphysematous changes, bibasilar atelectasis/scarring.  CT high-resolution 03/29/17-bronchial wall thickening, emphysematous changes.  Multiple small pulmonary nodules, atherosclerosis, coronary artery disease.  No interstitial lung disease, mild aortic aneurysm. I have reviewed the images personally  CBC 03/04/17- Absolute eos count 285  Assessment:  COPD Mild obstruction with emphysematous changes.  Been tried on different inhalers but has not made any change to her symptoms.  She has very mild symptoms and does not need to be on controller medication.  Continue albuterol as needed  CT scan reviewed with multiple small lung nodules, no evidence of interstitial lung disease.  There is evidence of coronary atherosclerosis and she will follow-up with her regular cardiologist and primary care Has annual low-dose screening CTs of the chest already in place.  Active smoker Smoking cessation discussed.  Plan/Recommendations: - Albuterol as needed - Smoking cessation.  Marshell Garfinkel MD [place] [facility] Pager [phone] 05/01/2017, 11:34 AM  CC: [name], NP

## 2017-05-01 NOTE — Patient Instructions (Signed)
I am glad that your breathing is better.  You can use albuterol as needed Please call us if you are using albuterol every day then will need to reassess if need to go on a regular inhaler  CT scan does not show any interstitial lung disease.  There is evidence of emphysema, small pulmonary nodules which need to be followed every year There is evidence of coronary atherosclerosis.  Please follow-up with your cardiologist and primary care regarding this Follow-up in clinic in 6 months.

## 2017-05-01 NOTE — Progress Notes (Signed)
PFT done today. 

## 2017-05-15 NOTE — Progress Notes (Signed)
[facility] Follow-up Visit:  Assessment: Thrombocythemia, essential ([place]) 67 y.o. female undergoing cytoreductive therapy with hydroxyurea for diagnosis of essential thrombocythemia.  CBC today demonstrates improving hemoglobin level and normal platelet count.  Patient is feeling better with improved energy and regaining weight.  Plan: -Continue hydroxyurea 500 mg daily Monday through Friday and 1000 mg daily on Saturday and Sunday -Return to clinic in 2 months with labs for continued hematological monitoring.  Voice recognition software was used and creation of this note. Despite my best effort at editing the text, some misspelling/errors may have occurred.  Orders Placed This Encounter  Procedures  . CBC with Differential    Standing Status:   Future    Standing Expiration Date:   05/01/2018  . Comprehensive metabolic panel    Standing Status:   Future    Standing Expiration Date:   05/01/2018  . Lactate dehydrogenase    Standing Status:   Future    Standing Expiration Date:   05/01/2018    Cancer Staging No matching staging information was found for the patient.  All questions were answered.  . The patient knows to call the clinic with any problems, questions or concerns.  This note was electronically signed.    History of Presenting Illness Ashley [name] 67 y.o. presenting to the [facility] for history of adenocarcinoma of the left breast as well as history of cutaneous malignant melanoma outlined below.  Presently, patient returns for follow-up for diagnosis of essential thrombocythemia receiving therapy with hydroxyurea with 1000 mg on Saturday and Sunday and 500 mg for the rest of the week.   Hydroxyurea doses were reduced at last visit to the clinic.  Since the last visit, patient reports feeling better with overall improving energy and regaining 5-6 pounds of weight.  Patient denies any new discomfort in the breast, any new masses in the breast or  chest wall.  No axillary or supraclavicular lymphadenopathy.  No new cutaneous lesions.  Oncological/hematological History:   Adenocarcinoma of left breast ([place])   09/29/1993 - 05/14/1994 Chemotherapy     AC Q 3 weeks X 6 cycles      01/02/1994 Surgery    Left modified radical mastectomy      01/02/1994 Pathology Results    ER-, PR - with a single positive LN found in the L axillae, Stage II disease      07/22/2015 Imaging    Bone Scan, No metastatic pattern uptake, degenerative changes in the lumbar spine with dextroscoliosis      05/25/2016 PET scan    No findings of active malignancy in the neck, chest, abdomen, or pelvis. The 3 liver lesions are not hypermetabolic. The radiologist suspects they may be subtly present on prior CT chest from 10/2013, to further reassuring that these are likely benign lesions.       Melanoma ([place])   07/09/2013 Initial Biopsy    Initial biopsy L upper thigh/buttocks melanoma      07/20/2013 Surgery    Excision L lateral buttock/upper thigh melanoma, clear margins      07/20/2013 Pathology Results    Breslow depth 1.02 mm, pT2a        Medical History: Past Medical History:  Diagnosis Date  . Adenocarcinoma of left breast ([place]) 01/09/2016  . Arthritis   . Ascending aortic aneurysm ([place])   . Cancer [facility]) 1995   breast, left, mastectomy/chemo  . Chest pain    Possibly cardiac. No evidence of ischemia/injury based upon  normal troponin I. Chest discomfort could be tachycardia induced supply demand mismatch.   . CHF (congestive heart failure) ([place]) 11/17/2015   after surgery   . Colon adenomas   . Coronary artery disease   . Dyspnea    with exertion  . Emphysema of lung ([place]) 11/17/2015  . Essential hypertension, benign   . GERD (gastroesophageal reflux disease)   . Hyperlipidemia   . Hypertension   . Melanoma ([place]) 01/09/2016  . Palpitations   . Pernicious anemia 03/06/2016  . Pure hypercholesterolemia   . Thrombocythemia, essential ([place])  01/09/2016  . Thrombocytosis ([place])    Idiopathic    Surgical History: Past Surgical History:  Procedure Laterality Date  . ANTERIOR AND POSTERIOR REPAIR N/A 12/09/2014   Procedure: ANTERIOR (CYSTOCELE) AND POSTERIOR REPAIR (RECTOCELE);  Surgeon: Bjorn Loser, MD;  Location: [facility];  Service: Urology;  Laterality: N/A;  . ANTERIOR CERVICAL DECOMPRESSION/DISCECTOMY FUSION 4 LEVELS Right 10/03/2016   Procedure: ANTERIOR CERVICAL DECOMPRESSION FUSION, CERVICAL 4-5, CERVICAL 5-6, CERVICAL 6-7, CERVICAL 7 TO THORACIC 1 WITH INSTRUMENTATION AND ALLOGRAFT;  Surgeon: Phylliss Bob, MD;  Location: [facility];  Service: Orthopedics;  Laterality: Right;  ANTERIOR CERVICAL DECOMPRESSION FUSION, CERVICAL 4-5, CERVICAL 5-6, CERVICAL 6-7, CERVICAL 7 TO THORACIC 1 WITH INSTRUMENTATION AND ALLOGRAFT; REQUEST 4 HO  . ANTERIOR LAT LUMBAR FUSION Left 11/16/2015   Procedure: LEFT SIDED LATERAL INTERBODY FUSION, LUMBAR 2-3, LUMBAR 3-4, LUMBAR 4-5 WITH INSTRUMENTATION;  Surgeon: Phylliss Bob, MD;  Location: [facility];  Service: Orthopedics;  Laterality: Left;  LEFT SIDED LATEARL INTERBODY FUSION, LUMBAR 2-3, LUMBAR 3-4, LUMBAR 4-5 WITH INSTRUMENTATION   . APPENDECTOMY    . BONE MARROW ASPIRATION  07/2012  . BONE MARROW BIOPSY  07/2012  . BREAST SURGERY    . CARDIAC CATHETERIZATION    . CARDIAC CATHETERIZATION N/A 01/20/2016   Procedure: Left Heart Cath and Coronary Angiography;  Surgeon: Burnell Blanks, MD;  Location: [facility];  Service: Cardiovascular;  Laterality: N/A;  . COLONOSCOPY  11/29/2010   Procedure: COLONOSCOPY;  Surgeon: Rogene Houston, MD;  [facility];  Service: Endoscopy;  Laterality: N/A;  . COLONOSCOPY N/A 02/18/2014   Procedure: COLONOSCOPY;  Surgeon: Rogene Houston, MD;  [facility];  Service: Endoscopy;  Laterality: N/A;  1030  . COLONOSCOPY N/A 02/25/2017   Procedure: COLONOSCOPY;  Surgeon: Rogene Houston, MD;  [facility];  Service: Endoscopy;   Laterality: N/A;  10:55  . CYSTOSCOPY N/A 12/09/2014   Procedure: CYSTOSCOPY;  Surgeon: Bjorn Loser, MD;  Location: [facility];  Service: Urology;  Laterality: N/A;  . ESOPHAGEAL DILATION N/A 02/25/2017   Procedure: ESOPHAGEAL DILATION;  Surgeon: Rogene Houston, MD;  [facility];  Service: Endoscopy;  Laterality: N/A;  . ESOPHAGOGASTRODUODENOSCOPY N/A 02/25/2017   Procedure: ESOPHAGOGASTRODUODENOSCOPY (EGD);  Surgeon: Rogene Houston, MD;  [facility];  Service: Endoscopy;  Laterality: N/A;  . IR GENERIC HISTORICAL  01/11/2016   IR RADIOLOGY PERIPHERAL GUIDED IV START 01/11/2016 Saverio Danker, PA-C [facility]  . IR GENERIC HISTORICAL  01/11/2016   IR US GUIDE VASC ACCESS RIGHT 01/11/2016 Saverio Danker, PA-C [facility]  . MASTECTOMY     left  . OVARIAN CYST SURGERY     x2  . POLYPECTOMY  02/25/2017   Procedure: POLYPECTOMY;  Surgeon: Rogene Houston, MD;  [facility];  Service: Endoscopy;;  . SALPINGOOPHORECTOMY Bilateral 12/09/2014   Procedure: SALPINGO OOPHORECTOMY;  Surgeon: Servando Salina, MD;  Location: [facility];  Service: Gynecology;  Laterality: Bilateral;  . TUBAL LIGATION    . VAGINAL HYSTERECTOMY N/A 12/09/2014   Procedure: HYSTERECTOMY VAGINAL;  Surgeon: Servando Salina, MD;  Location: [facility];  Service: Gynecology;  Laterality: N/A;    Family History: Family History  Problem Relation Age of Onset  . Hypertension Mother   . Heart failure Mother   . Congestive Heart Failure Mother   . COPD Mother   . Pernicious anemia Mother   . Cancer Mother        lung  . Hypertension Father   . CAD Father   . Heart attack Father   . Hypertension Sister   . Cancer Other   . Celiac disease Other     Social History: Social History   Socioeconomic History  . Marital status: Divorced    Spouse name: Not on file  . Number of children: 2  . Years of education: Not on file  . Highest education level: Not on file  Social Needs  .  Financial resource strain: Not on file  . Food insecurity - worry: Not on file  . Food insecurity - inability: Not on file  . Transportation needs - medical: Not on file  . Transportation needs - non-medical: Not on file  Occupational History  . Not on file  Tobacco Use  . Smoking status: Current Every Day Smoker    Packs/day: 0.50    Years: 50.00    Pack years: 25.00    Types: Cigarettes  . Smokeless tobacco: Never Used  Substance and Sexual Activity  . Alcohol use: No  . Drug use: No  . Sexual activity: Yes    Birth control/protection: Post-menopausal  Other Topics Concern  . Not on file  Social History Narrative  . Not on file    Allergies: Allergies  Allergen Reactions  . Penicillins Hives and Rash    Has patient had a PCN reaction causing immediate rash, facial/tongue/throat swelling, SOB or lightheadedness with hypotension: Yes Has patient had a PCN reaction causing severe rash involving mucus membranes or skin necrosis: Yes Has patient had a PCN reaction that required hospitalization No Has patient had a PCN reaction occurring within the last 10 years: Yes If all of the above answers are "NO", then may proceed with Cephalosporin use.   . Tape Rash    Medipore, Coban, and paper tape CAN be tolerated    Medications:  Current Outpatient Medications  Medication Sig Dispense Refill  . albuterol (PROVENTIL HFA;VENTOLIN HFA) 108 (90 Base) MCG/ACT inhaler Inhale 2 puffs into the lungs every 6 (six) hours as needed for wheezing or shortness of breath. 1 Inhaler 2  . aspirin EC 81 MG tablet Take 81 mg by mouth daily.    . Calcium Carb-Cholecalciferol (CALCIUM 600+D) 600-800 MG-UNIT TABS Take 1 tablet by mouth 2 (two) times daily.    . Cholecalciferol (VITAMIN D-3) 1000 units CAPS Take 1,000 Units daily by mouth.     . clonazePAM (KLONOPIN) 0.5 MG tablet TAKE (1) TABLET BY MOUTH AT BEDTIME. 30 tablet 3  . cyanocobalamin (,VITAMIN B-12,) 1000 MCG/ML injection INJECT 1 ML  INTO THE MUSCLE ONCE MONTHLY AS DIRECTED. 1 mL 5  . furosemide (LASIX) 40 MG tablet Take 40 mg daily by mouth.     . gabapentin (NEURONTIN) 300 MG capsule Take 300 mg by mouth at bedtime.    . hydroxyurea (HYDREA) 500 MG capsule 500 mg once daily Mondays thru friday and 1000 mg once  daily on Saturday and Sunday. 36 capsule 1  . ibandronate (BONIVA) 150 MG tablet Take 1 tablet by mouth every 30 (thirty) days.    Marland Kitchen ibuprofen (ADVIL,MOTRIN) 800 MG tablet Take 800 mg every 8 (eight) hours as needed by mouth for moderate pain.    Marland Kitchen ipratropium-albuterol (DUONEB) 0.5-2.5 (3) MG/3ML SOLN Take 3 mLs by nebulization every 6 (six) hours as needed (shortness of breath). 360 mL 0  . irbesartan (AVAPRO) 75 MG tablet Take 75 mg at bedtime by mouth.     . methocarbamol (ROBAXIN) 500 MG tablet Take 500 mg daily as needed by mouth for muscle spasms.     . pantoprazole (PROTONIX) 40 MG tablet Take 40 mg daily by mouth.    . potassium chloride SA (K-DUR,KLOR-CON) 20 MEQ tablet Take 1 tablet (20 mEq total) by mouth 3 (three) times daily. 90 tablet 5  . rosuvastatin (CRESTOR) 40 MG tablet Take 40 mg at bedtime by mouth.     . traMADol (ULTRAM) 50 MG tablet Take 50-100 mg by mouth 3 (three) times daily as needed (pain).      No current facility-administered medications for this visit.     Review of Systems: Review of Systems  All other systems reviewed and are negative.    PHYSICAL EXAMINATION There were no vitals taken for this visit.  ECOG PERFORMANCE STATUS: 1 - Symptomatic but completely ambulatory  Physical Exam  Constitutional: She is oriented to person, place, and time and well-developed, well-nourished, and in no distress. No distress.  HENT:  Head: Normocephalic and atraumatic.  Mouth/Throat: Oropharynx is clear and moist. No oropharyngeal exudate.  Eyes: Conjunctivae and EOM are normal. Pupils are equal, round, and reactive to light. No scleral icterus.  Neck: No thyromegaly present.   Cardiovascular: Normal rate, regular rhythm and normal heart sounds.  No murmur heard. Pulmonary/Chest: Effort normal and breath sounds normal. No respiratory distress. She has no wheezes. She has no rales.  Abdominal: Soft. Bowel sounds are normal. She exhibits no distension and no mass. There is no tenderness. There is no guarding.  Musculoskeletal: She exhibits no edema.  Lymphadenopathy:    She has no cervical adenopathy.  Neurological: She is alert and oriented to person, place, and time. She has normal reflexes. No cranial nerve deficit.  Skin: Skin is warm and dry. No rash noted. She is not diaphoretic. No erythema. No pallor.     LABORATORY DATA: I have personally reviewed the data as listed: Appointment on 05/01/2017  Component Date Value Ref Range Status  . WBC 05/01/2017 6.9  4.0 - 10.5 K/uL Final  . RBC 05/01/2017 2.68* 3.87 - 5.11 MIL/uL Final  . Hemoglobin 05/01/2017 9.7* 12.0 - 15.0 g/dL Final  . HCT 05/01/2017 29.9* 36.0 - 46.0 % Final  . MCV 05/01/2017 111.6* 78.0 - 100.0 fL Final  . MCH 05/01/2017 36.2* 26.0 - 34.0 pg Final  . MCHC 05/01/2017 32.4  30.0 - 36.0 g/dL Final  . RDW 05/01/2017 14.7  11.5 - 15.5 % Final  . Platelets 05/01/2017 435* 150 - 400 K/uL Final  . Neutrophils Relative % 05/01/2017 67  % Final  . Neutro Abs 05/01/2017 4.6  1.7 - 7.7 K/uL Final  . Lymphocytes Relative 05/01/2017 23  % Final  . Lymphs Abs 05/01/2017 1.6  0.7 - 4.0 K/uL Final  . Monocytes Relative 05/01/2017 7  % Final  . Monocytes Absolute 05/01/2017 0.5  0.1 - 1.0 K/uL Final  . Eosinophils Relative 05/01/2017 3  %  Final  . Eosinophils Absolute 05/01/2017 0.2  0.0 - 0.7 K/uL Final  . Basophils Relative 05/01/2017 0  % Final  . Basophils Absolute 05/01/2017 0.0  0.0 - 0.1 K/uL Final  . Sodium 05/01/2017 137  135 - 145 mmol/L Final  . Potassium 05/01/2017 3.5  3.5 - 5.1 mmol/L Final  . Chloride 05/01/2017 102  101 - 111 mmol/L Final  . CO2 05/01/2017 24  22 - 32 mmol/L Final  .  Glucose, Bld 05/01/2017 121* 65 - 99 mg/dL Final  . BUN 05/01/2017 16  6 - 20 mg/dL Final  . Creatinine, Ser 05/01/2017 1.44* 0.44 - 1.00 mg/dL Final  . Calcium 05/01/2017 8.8* 8.9 - 10.3 mg/dL Final  . Total Protein 05/01/2017 7.1  6.5 - 8.1 g/dL Final  . Albumin 05/01/2017 3.8  3.5 - 5.0 g/dL Final  . AST 05/01/2017 27  15 - 41 U/L Final  . ALT 05/01/2017 16  14 - 54 U/L Final  . Alkaline Phosphatase 05/01/2017 57  38 - 126 U/L Final  . Total Bilirubin 05/01/2017 0.3  0.3 - 1.2 mg/dL Final  . GFR calc non Af Amer 05/01/2017 36* >60 mL/min Final  . GFR calc Af Amer 05/01/2017 42* >60 mL/min Final   Comment: (NOTE) The eGFR has been calculated using the CKD EPI equation. This calculation has not been validated in all clinical situations. eGFR's persistently <60 mL/min signify possible Chronic Kidney Disease.   . Anion gap 05/01/2017 11  5 - 15 Final  Clinical Support on 05/01/2017  Component Date Value Ref Range Status  . FVC-Pre 05/01/2017 3.27  L Final  . FVC-%Pred-Pre 05/01/2017 122  % Final  . FVC-Post 05/01/2017 3.20  L Final  . FVC-%Pred-Post 05/01/2017 119  % Final  . FVC-%Change-Post 05/01/2017 -2  % Final  . FEV1-Pre 05/01/2017 2.24  L Final  . FEV1-%Pred-Pre 05/01/2017 110  % Final  . FEV1-Post 05/01/2017 2.24  L Final  . FEV1-%Pred-Post 05/01/2017 110  % Final  . FEV1-%Change-Post 05/01/2017 0  % Final  . FEV6-Pre 05/01/2017 3.23  L Final  . FEV6-%Pred-Pre 05/01/2017 126  % Final  . FEV6-Post 05/01/2017 3.14  L Final  . FEV6-%Pred-Post 05/01/2017 122  % Final  . FEV6-%Change-Post 05/01/2017 -2  % Final  . Pre FEV1/FVC ratio 05/01/2017 68  % Final  . FEV1FVC-%Pred-Pre 05/01/2017 89  % Final  . Post FEV1/FVC ratio 05/01/2017 70  % Final  . FEV1FVC-%Change-Post 05/01/2017 2  % Final  . Pre FEV6/FVC Ratio 05/01/2017 99  % Final  . FEV6FVC-%Pred-Pre 05/01/2017 102  % Final  . Post FEV6/FVC ratio 05/01/2017 100  % Final  . FEV6FVC-%Pred-Post 05/01/2017 104  % Final  .  FEV6FVC-%Change-Post 05/01/2017 1  % Final  . FEF 25-75 Pre 05/01/2017 1.31  L/sec Final  . FEF2575-%Pred-Pre 05/01/2017 72  % Final  . FEF 25-75 Post 05/01/2017 1.27  L/sec Final  . FEF2575-%Pred-Post 05/01/2017 71  % Final  . FEF2575-%Change-Post 05/01/2017 -2  % Final  . RV 05/01/2017 2.56  L Final  . RV % pred 05/01/2017 127  % Final  . TLC 05/01/2017 5.58  L Final  . TLC % pred 05/01/2017 121  % Final  . DLCO unc 05/01/2017 9.66  ml/min/mmHg Final  . DLCO unc % pred 05/01/2017 47  % Final  . DLCO cor 05/01/2017 10.81  ml/min/mmHg Final  . DLCO cor % pred 05/01/2017 53  % Final  . DL/VA 05/01/2017 2.08  ml/min/mmHg/L Final  .  DL/VA % pred 05/01/2017 47  % Final       Ardath Sax, MD

## 2017-05-15 NOTE — Assessment & Plan Note (Signed)
67 y.o. female undergoing cytoreductive therapy with hydroxyurea for diagnosis of essential thrombocythemia.  CBC today demonstrates improving hemoglobin level and normal platelet count.  Patient is feeling better with improved energy and regaining weight.  Plan: -Continue hydroxyurea 500 mg daily Monday through Friday and 1000 mg daily on Saturday and Sunday -Return to clinic in 2 months with labs for continued hematological monitoring.

## 2017-06-06 ENCOUNTER — Other Ambulatory Visit (HOSPITAL_COMMUNITY): Payer: Self-pay | Admitting: Oncology

## 2017-06-13 ENCOUNTER — Telehealth (HOSPITAL_COMMUNITY): Payer: Self-pay

## 2017-06-13 DIAGNOSIS — D473 Essential (hemorrhagic) thrombocythemia: Secondary | ICD-10-CM

## 2017-06-13 MED ORDER — HYDROXYUREA 500 MG PO CAPS: ORAL_CAPSULE | ORAL | 1 refills | Status: DC

## 2017-06-13 NOTE — Telephone Encounter (Signed)
Message left for refill sent to [facility] for her hydrea and to call for any questions.    Per last note May 01, 2017, by Dr. Lebron Conners, patient to continue same dose as directed.

## 2017-06-27 ENCOUNTER — Other Ambulatory Visit (HOSPITAL_COMMUNITY): Payer: Self-pay

## 2017-06-27 ENCOUNTER — Other Ambulatory Visit (HOSPITAL_COMMUNITY): Payer: Self-pay | Admitting: Oncology

## 2017-06-27 DIAGNOSIS — C4372 Malignant melanoma of left lower limb, including hip: Secondary | ICD-10-CM

## 2017-06-27 DIAGNOSIS — Z1231 Encounter for screening mammogram for malignant neoplasm of breast: Secondary | ICD-10-CM

## 2017-06-27 DIAGNOSIS — D473 Essential (hemorrhagic) thrombocythemia: Secondary | ICD-10-CM

## 2017-06-27 DIAGNOSIS — D510 Vitamin B12 deficiency anemia due to intrinsic factor deficiency: Secondary | ICD-10-CM

## 2017-06-27 DIAGNOSIS — C50912 Malignant neoplasm of unspecified site of left female breast: Secondary | ICD-10-CM

## 2017-06-27 DIAGNOSIS — R918 Other nonspecific abnormal finding of lung field: Secondary | ICD-10-CM

## 2017-07-01 ENCOUNTER — Encounter (HOSPITAL_COMMUNITY): Payer: Self-pay | Admitting: Internal Medicine

## 2017-07-01 ENCOUNTER — Inpatient Hospital Stay (HOSPITAL_COMMUNITY): Payer: Medicare Other | Attending: Internal Medicine | Admitting: Internal Medicine

## 2017-07-01 ENCOUNTER — Inpatient Hospital Stay (HOSPITAL_COMMUNITY): Payer: Medicare Other

## 2017-07-01 ENCOUNTER — Other Ambulatory Visit: Payer: Self-pay

## 2017-07-01 VITALS — BP 97/64 | HR 78 | Temp 97.9°F | Resp 20 | Wt 102.6 lb

## 2017-07-01 DIAGNOSIS — Z171 Estrogen receptor negative status [ER-]: Secondary | ICD-10-CM

## 2017-07-01 DIAGNOSIS — Z853 Personal history of malignant neoplasm of breast: Secondary | ICD-10-CM | POA: Diagnosis not present

## 2017-07-01 DIAGNOSIS — I2510 Atherosclerotic heart disease of native coronary artery without angina pectoris: Secondary | ICD-10-CM | POA: Diagnosis not present

## 2017-07-01 DIAGNOSIS — Z9012 Acquired absence of left breast and nipple: Secondary | ICD-10-CM

## 2017-07-01 DIAGNOSIS — E7800 Pure hypercholesterolemia, unspecified: Secondary | ICD-10-CM | POA: Insufficient documentation

## 2017-07-01 DIAGNOSIS — Z79899 Other long term (current) drug therapy: Secondary | ICD-10-CM

## 2017-07-01 DIAGNOSIS — I712 Thoracic aortic aneurysm, without rupture: Secondary | ICD-10-CM | POA: Insufficient documentation

## 2017-07-01 DIAGNOSIS — K219 Gastro-esophageal reflux disease without esophagitis: Secondary | ICD-10-CM | POA: Diagnosis not present

## 2017-07-01 DIAGNOSIS — I110 Hypertensive heart disease with heart failure: Secondary | ICD-10-CM | POA: Diagnosis not present

## 2017-07-01 DIAGNOSIS — Z85820 Personal history of malignant melanoma of skin: Secondary | ICD-10-CM

## 2017-07-01 DIAGNOSIS — J439 Emphysema, unspecified: Secondary | ICD-10-CM | POA: Diagnosis not present

## 2017-07-01 DIAGNOSIS — D7589 Other specified diseases of blood and blood-forming organs: Secondary | ICD-10-CM | POA: Diagnosis not present

## 2017-07-01 DIAGNOSIS — I509 Heart failure, unspecified: Secondary | ICD-10-CM | POA: Insufficient documentation

## 2017-07-01 DIAGNOSIS — F17210 Nicotine dependence, cigarettes, uncomplicated: Secondary | ICD-10-CM | POA: Diagnosis not present

## 2017-07-01 DIAGNOSIS — K769 Liver disease, unspecified: Secondary | ICD-10-CM | POA: Diagnosis not present

## 2017-07-01 DIAGNOSIS — Z9221 Personal history of antineoplastic chemotherapy: Secondary | ICD-10-CM

## 2017-07-01 DIAGNOSIS — Z7982 Long term (current) use of aspirin: Secondary | ICD-10-CM | POA: Diagnosis not present

## 2017-07-01 DIAGNOSIS — D473 Essential (hemorrhagic) thrombocythemia: Secondary | ICD-10-CM

## 2017-07-01 DIAGNOSIS — R9389 Abnormal findings on diagnostic imaging of other specified body structures: Secondary | ICD-10-CM

## 2017-07-01 LAB — COMPREHENSIVE METABOLIC PANEL
ALBUMIN: 3.5 g/dL (ref 3.5–5.0)
ALK PHOS: 61 U/L (ref 38–126)
ALT: 23 U/L (ref 14–54)
ANION GAP: 12 (ref 5–15)
AST: 33 U/L (ref 15–41)
BILIRUBIN TOTAL: 0.6 mg/dL (ref 0.3–1.2)
BUN: 30 mg/dL — AB (ref 6–20)
CALCIUM: 8.7 mg/dL — AB (ref 8.9–10.3)
CO2: 24 mmol/L (ref 22–32)
Chloride: 101 mmol/L (ref 101–111)
Creatinine, Ser: 1.25 mg/dL — ABNORMAL HIGH (ref 0.44–1.00)
GFR calc Af Amer: 50 mL/min — ABNORMAL LOW (ref >60)
GFR calc non Af Amer: 43 mL/min — ABNORMAL LOW (ref >60)
Glucose, Bld: 102 mg/dL — ABNORMAL HIGH (ref 65–99)
Potassium: 3.8 mmol/L (ref 3.5–5.1)
SODIUM: 137 mmol/L (ref 135–145)
Total Protein: 6.9 g/dL (ref 6.5–8.1)

## 2017-07-01 LAB — CBC WITH DIFFERENTIAL/PLATELET
BASOS ABS: 0 10*3/uL (ref 0.0–0.1)
BASOS PCT: 0 %
EOS ABS: 0 10*3/uL (ref 0.0–0.7)
Eosinophils Relative: 0 %
HEMATOCRIT: 33.1 % — AB (ref 36.0–46.0)
HEMOGLOBIN: 10.9 g/dL — AB (ref 12.0–15.0)
Lymphocytes Relative: 15 %
Lymphs Abs: 1.5 10*3/uL (ref 0.7–4.0)
MCH: 35.2 pg — ABNORMAL HIGH (ref 26.0–34.0)
MCHC: 32.9 g/dL (ref 30.0–36.0)
MCV: 106.8 fL — ABNORMAL HIGH (ref 78.0–100.0)
Monocytes Absolute: 0.9 10*3/uL (ref 0.1–1.0)
Monocytes Relative: 9 %
NEUTROS ABS: 7.6 10*3/uL (ref 1.7–7.7)
NEUTROS PCT: 76 %
Platelets: 521 10*3/uL — ABNORMAL HIGH (ref 150–400)
RBC: 3.1 MIL/uL — AB (ref 3.87–5.11)
RDW: 16.8 % — ABNORMAL HIGH (ref 11.5–15.5)
WBC: 10.1 10*3/uL (ref 4.0–10.5)

## 2017-07-01 LAB — LACTATE DEHYDROGENASE: LDH: 144 U/L (ref 98–192)

## 2017-07-01 NOTE — Progress Notes (Signed)
Diagnosis Abnormal CT of the chest - Plan: NM PET Image Restag (PS) Skull Base To Thigh, CBC with Differential/Platelet, Comprehensive metabolic panel, Lactate dehydrogenase  Staging Cancer Staging No matching staging information was found for the patient.  Assessment and Plan: 1.  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  Labs done 07/01/2017 showed WBC 10.1 HB 10.9 and plts 521,000.  Pt remains on hydrea.  She will RTC 09/2017 for follow-up and repeat labs.    2.  Macrocytosis.  MCV 107.  This is due to hydrea.    3.  Stage II adenocarcinoma of L breast.  Pt is S/P left modified radical mastectomy with axillary node dissection with 1 left axillary lymph node positive after neoadjuvant AC every 3 weeks x 6 cycles ([place]).  Genetic testing was negative for BRCA.  Patient is due for mammogram in April 2019.  This is ordered today.  She will RTC to go over results.    4.  T2aN0 Melanoma LLE>  Pt has Melanoma of L upper/thigh buttocks.  S/P excision of lesion with clear margins identifying a Breslow depth of 1.02 mm, pT2A.  She reports she continues to be followed by dermatology.  5.  Liver lesions.  Pt had a PET scan done 05/25/2016 that showed   IMPRESSION: 1. No findings of active malignancy in the neck, chest, abdomen, or pelvis. The 3 liver lesions are not hypermetabolic. Moreover, I suspect that they may be subtly present on the prior CT chest from 10/23/2013, further reassuring that these are likely benign. 2. The ascending aorta measures 4.2 cm in diameter. Recommend annual imaging followup by CTA or MRA. This recommendation follows 2010 ACCF/AHA/AATS/ACR/ASA/SCA/SCAI/SIR/STS/SVM Guidelines for the Diagnosis and Management of Patients with Thoracic Aortic Disease. Circulation. 2010; 121: L893-T342 3. Other imaging findings of potential clinical significance: Coronary, aortic arch, and branch vessel atherosclerotic vascular disease. Prominent emphysema. Several subpleural nodules along  the major fissures have no significant accentuated metabolic activity and are probably benign. Right adrenal adenoma. 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 Postoperative findings in the lumbar spine.  Based on history of breast cancer and melanoma, she will be set up for PET scan in 1 week.  She will RTC in 07/2017 for follow-up to go over PET scan and mammogram.    6.  Pernicious anemia.  Pt on B12 1000 mcg, monthly- administered at home.    Adenocarcinoma of left breast ([place])   09/29/1993 - 05/14/1994 Chemotherapy     AC Q 3 weeks X 6 cycles      01/02/1994 Surgery    Left modified radical mastectomy      01/02/1994 Pathology Results    ER-, PR - with a single positive LN found in the L axillae, Stage II disease      07/22/2015 Imaging    Bone Scan, No metastatic pattern uptake, degenerative changes in the lumbar spine with dextroscoliosis      05/25/2016 PET scan    No findings of active malignancy in the neck, chest, abdomen, or pelvis. The 3 liver lesions are not hypermetabolic. The radiologist suspects they may be subtly present on prior CT chest from 10/2013, to further reassuring that these are likely benign lesions.       Melanoma ([place])   07/09/2013 Initial Biopsy    Initial biopsy L upper thigh/buttocks melanoma      07/20/2013 Surgery    Excision L lateral buttock/upper thigh melanoma, clear margins      07/20/2013 Pathology Results    Breslow depth 1.02  mm, pT2a        Problem List Patient Active Problem List   Diagnosis Date Noted  . Abnormal CT of the chest [R93.89] 02/18/2017  . Esophageal dysphagia [R13.10] 02/18/2017  . Pernicious anemia [D51.0] 03/06/2016  . Dyspnea [R06.00]   . Non-ischemic cardiomyopathy ([place]) [I42.8]   . Adenocarcinoma of left breast ([place]) [C50.912] 01/09/2016  . Melanoma ([place]) [C43.9] 01/09/2016  . Thrombocythemia, essential ([place]) [D47.3] 01/09/2016  . Acute respiratory failure ([place]) [J96.00] 11/17/2015  . Emphysema of lung ([place])  [J43.9] 11/17/2015  . CHF exacerbation ([place]) [I50.9] 11/17/2015  . Radiculopathy [M54.10] 11/16/2015  . Acute right-sided low back pain with sciatica [M54.40] 07/19/2015  . Multiple pulmonary nodules [R91.8] 01/18/2015  . Status post vaginal hysterectomy [Z90.710] 12/09/2014  . BRCA negative [Z13.71] 07/21/2014    Past Medical History Past Medical History:  Diagnosis Date  . Adenocarcinoma of left breast ([place]) 01/09/2016  . Arthritis   . Ascending aortic aneurysm ([place])   . Cancer [facility]) 1995   breast, left, mastectomy/chemo  . Chest pain    Possibly cardiac. No evidence of ischemia/injury based upon normal troponin I. Chest discomfort could be tachycardia induced supply demand mismatch.   . CHF (congestive heart failure) ([place]) 11/17/2015   after surgery   . Colon adenomas   . Coronary artery disease   . Dyspnea    with exertion  . Emphysema of lung ([place]) 11/17/2015  . Essential hypertension, benign   . GERD (gastroesophageal reflux disease)   . Hyperlipidemia   . Hypertension   . Melanoma ([place]) 01/09/2016  . Palpitations   . Pernicious anemia 03/06/2016  . Pure hypercholesterolemia   . Thrombocythemia, essential ([place]) 01/09/2016  . Thrombocytosis ([place])    Idiopathic    Past Surgical History Past Surgical History:  Procedure Laterality Date  . ANTERIOR AND POSTERIOR REPAIR N/A 12/09/2014   Procedure: ANTERIOR (CYSTOCELE) AND POSTERIOR REPAIR (RECTOCELE);  Surgeon: Bjorn Loser, MD;  Location: [facility];  Service: Urology;  Laterality: N/A;  . ANTERIOR CERVICAL DECOMPRESSION/DISCECTOMY FUSION 4 LEVELS Right 10/03/2016   Procedure: ANTERIOR CERVICAL DECOMPRESSION FUSION, CERVICAL 4-5, CERVICAL 5-6, CERVICAL 6-7, CERVICAL 7 TO THORACIC 1 WITH INSTRUMENTATION AND ALLOGRAFT;  Surgeon: Phylliss Bob, MD;  Location: [place];  Service: Orthopedics;  Laterality: Right;  ANTERIOR CERVICAL DECOMPRESSION FUSION, CERVICAL 4-5, CERVICAL 5-6, CERVICAL 6-7, CERVICAL 7 TO THORACIC 1 WITH  INSTRUMENTATION AND ALLOGRAFT; REQUEST 4 HO  . ANTERIOR LAT LUMBAR FUSION Left 11/16/2015   Procedure: LEFT SIDED LATERAL INTERBODY FUSION, LUMBAR 2-3, LUMBAR 3-4, LUMBAR 4-5 WITH INSTRUMENTATION;  Surgeon: Phylliss Bob, MD;  Location: [facility];  Service: Orthopedics;  Laterality: Left;  LEFT SIDED LATEARL INTERBODY FUSION, LUMBAR 2-3, LUMBAR 3-4, LUMBAR 4-5 WITH INSTRUMENTATION   . APPENDECTOMY    . BONE MARROW ASPIRATION  07/2012  . BONE MARROW BIOPSY  07/2012  . BREAST SURGERY    . CARDIAC CATHETERIZATION    . CARDIAC CATHETERIZATION N/A 01/20/2016   Procedure: Left Heart Cath and Coronary Angiography;  Surgeon: Burnell Blanks, MD;  Location: [facility];  Service: Cardiovascular;  Laterality: N/A;  . COLONOSCOPY  11/29/2010   Procedure: COLONOSCOPY;  Surgeon: Rogene Houston, MD;  [facility];  Service: Endoscopy;  Laterality: N/A;  . COLONOSCOPY N/A 02/18/2014   Procedure: COLONOSCOPY;  Surgeon: Rogene Houston, MD;  [facility];  Service: Endoscopy;  Laterality: N/A;  1030  . COLONOSCOPY N/A 02/25/2017   Procedure: COLONOSCOPY;  Surgeon: Rogene Houston,  MD;  [facility];  Service: Endoscopy;  Laterality: N/A;  10:55  . CYSTOSCOPY N/A 12/09/2014   Procedure: CYSTOSCOPY;  Surgeon: Bjorn Loser, MD;  Location: [facility];  Service: Urology;  Laterality: N/A;  . ESOPHAGEAL DILATION N/A 02/25/2017   Procedure: ESOPHAGEAL DILATION;  Surgeon: Rogene Houston, MD;  [facility];  Service: Endoscopy;  Laterality: N/A;  . ESOPHAGOGASTRODUODENOSCOPY N/A 02/25/2017   Procedure: ESOPHAGOGASTRODUODENOSCOPY (EGD);  Surgeon: Rogene Houston, MD;  [facility];  Service: Endoscopy;  Laterality: N/A;  . IR GENERIC HISTORICAL  01/11/2016   IR RADIOLOGY PERIPHERAL GUIDED IV START 01/11/2016 Saverio Danker, PA-C [facility]  . IR GENERIC HISTORICAL  01/11/2016   IR US GUIDE VASC ACCESS RIGHT 01/11/2016 Saverio Danker, PA-C [facility]  .  MASTECTOMY     left  . OVARIAN CYST SURGERY     x2  . POLYPECTOMY  02/25/2017   Procedure: POLYPECTOMY;  Surgeon: Rogene Houston, MD;  [facility];  Service: Endoscopy;;  . SALPINGOOPHORECTOMY Bilateral 12/09/2014   Procedure: SALPINGO OOPHORECTOMY;  Surgeon: Servando Salina, MD;  Location: [facility];  Service: Gynecology;  Laterality: Bilateral;  . TUBAL LIGATION    . VAGINAL HYSTERECTOMY N/A 12/09/2014   Procedure: HYSTERECTOMY VAGINAL;  Surgeon: Servando Salina, MD;  Location: [facility];  Service: Gynecology;  Laterality: N/A;    Family History Family History  Problem Relation Age of Onset  . Hypertension Mother   . Heart failure Mother   . Congestive Heart Failure Mother   . COPD Mother   . Pernicious anemia Mother   . Cancer Mother        lung  . Hypertension Father   . CAD Father   . Heart attack Father   . Hypertension Sister   . Cancer Other   . Celiac disease Other      Social History  reports that she has been smoking cigarettes.  She has a 25.00 pack-year smoking history. she has never used smokeless tobacco. She reports that she does not drink alcohol or use drugs.  Medications  Current Outpatient Medications:  .  albuterol (PROVENTIL HFA;VENTOLIN HFA) 108 (90 Base) MCG/ACT inhaler, Inhale 2 puffs into the lungs every 6 (six) hours as needed for wheezing or shortness of breath., Disp: 1 Inhaler, Rfl: 2 .  aspirin EC 81 MG tablet, Take 81 mg by mouth daily., Disp: , Rfl:  .  Calcium Carb-Cholecalciferol (CALCIUM 600+D) 600-800 MG-UNIT TABS, Take 1 tablet by mouth 2 (two) times daily., Disp: , Rfl:  .  Cholecalciferol (VITAMIN D-3) 1000 units CAPS, Take 1,000 Units daily by mouth. , Disp: , Rfl:  .  clonazePAM (KLONOPIN) 0.5 MG tablet, TAKE (1) TABLET BY MOUTH AT BEDTIME., Disp: 30 tablet, Rfl: 3 .  cyanocobalamin (,VITAMIN B-12,) 1000 MCG/ML injection, INJECT 1 ML INTO THE MUSCLE ONCE MONTHLY AS DIRECTED., Disp: 1 mL, Rfl: 5 .  furosemide (LASIX) 40  MG tablet, Take 40 mg daily by mouth. , Disp: , Rfl:  .  gabapentin (NEURONTIN) 300 MG capsule, Take 300 mg by mouth at bedtime., Disp: , Rfl:  .  hydroxyurea (HYDREA) 500 MG capsule, 500 mg once daily Mondays thru friday and 1000 mg once daily on Saturday and Sunday., Disp: 36 capsule, Rfl: 1 .  ibandronate (BONIVA) 150 MG tablet, Take 1 tablet by mouth every 30 (thirty) days., Disp: , Rfl:  .  ibuprofen (ADVIL,MOTRIN) 800 MG tablet, Take 800 mg every 8 (eight) hours as  needed by mouth for moderate pain., Disp: , Rfl:  .  ipratropium-albuterol (DUONEB) 0.5-2.5 (3) MG/3ML SOLN, Take 3 mLs by nebulization every 6 (six) hours as needed (shortness of breath)., Disp: 360 mL, Rfl: 0 .  irbesartan (AVAPRO) 75 MG tablet, Take 75 mg at bedtime by mouth. , Disp: , Rfl:  .  methocarbamol (ROBAXIN) 500 MG tablet, Take 500 mg daily as needed by mouth for muscle spasms. , Disp: , Rfl:  .  pantoprazole (PROTONIX) 40 MG tablet, Take 40 mg daily by mouth., Disp: , Rfl:  .  potassium chloride SA (K-DUR,KLOR-CON) 20 MEQ tablet, Take 1 tablet (20 mEq total) by mouth 3 (three) times daily., Disp: 90 tablet, Rfl: 5 .  rosuvastatin (CRESTOR) 40 MG tablet, Take 40 mg at bedtime by mouth. , Disp: , Rfl:  .  traMADol (ULTRAM) 50 MG tablet, Take 50-100 mg by mouth 3 (three) times daily as needed (pain). , Disp: , Rfl:   Allergies Penicillins and Tape  Review of Systems Review of Systems - Oncology ROS as per HPI otherwise 12 point ROS is negative.   Physical Exam  Vitals Wt Readings from Last 3 Encounters:  07/01/17 102 lb 9.6 oz (46.5 kg)  05/01/17 104 lb (47.2 kg)  03/19/17 103 lb 3.2 oz (46.8 kg)   Temp Readings from Last 3 Encounters:  07/01/17 97.9 F (36.6 C) (Oral)  03/04/17 98.4 F (36.9 C) (Oral)  02/25/17 97.6 F (36.4 C) (Oral)   BP Readings from Last 3 Encounters:  07/01/17 97/64  05/01/17 102/60  03/19/17 102/62   Pulse Readings from Last 3 Encounters:  07/01/17 78  05/01/17 86   03/19/17 87   Constitutional: Well-developed, well-nourished, and in no distress.   HENT: Head: Normocephalic and atraumatic.  Mouth/Throat: No oropharyngeal exudate. Mucosa moist. Eyes: Pupils are equal, round, and reactive to light. Conjunctivae are normal. No scleral icterus.  Neck: Normal range of motion. Neck supple. No JVD present.  Cardiovascular: Normal rate, regular rhythm and normal heart sounds.  Exam reveals no gallop and no friction rub.   No murmur heard. Pulmonary/Chest: Effort normal and breath sounds normal. No respiratory distress. No wheezes.No rales.  Abdominal: Soft. Bowel sounds are normal. No distension. There is no tenderness. There is no guarding.  Musculoskeletal: No edema or tenderness.  Lymphadenopathy: No cervical, axillary or supraclavicular adenopathy.  Neurological: Alert and oriented to person, place, and time. No cranial nerve deficit.  Skin: Skin is warm and dry. No rash noted. No erythema. No pallor.  Psychiatric: Affect and judgment normal.  Bilateral breast exam: Chaperone present.  Left mastectomy no signs of scar recurrence.  Right breast no dominant masses palpable.    Labs Appointment on 07/01/2017  Component Date Value Ref Range Status  . WBC 07/01/2017 10.1  4.0 - 10.5 K/uL Final  . RBC 07/01/2017 3.10* 3.87 - 5.11 MIL/uL Final  . Hemoglobin 07/01/2017 10.9* 12.0 - 15.0 g/dL Final  . HCT 07/01/2017 33.1* 36.0 - 46.0 % Final  . MCV 07/01/2017 106.8* 78.0 - 100.0 fL Final  . MCH 07/01/2017 35.2* 26.0 - 34.0 pg Final  . MCHC 07/01/2017 32.9  30.0 - 36.0 g/dL Final  . RDW 07/01/2017 16.8* 11.5 - 15.5 % Final  . Platelets 07/01/2017 521* 150 - 400 K/uL Final  . Neutrophils Relative % 07/01/2017 76  % Final  . Neutro Abs 07/01/2017 7.6  1.7 - 7.7 K/uL Final  . Lymphocytes Relative 07/01/2017 15  % Final  . Lymphs Abs  07/01/2017 1.5  0.7 - 4.0 K/uL Final  . Monocytes Relative 07/01/2017 9  % Final  . Monocytes Absolute 07/01/2017 0.9  0.1 -  1.0 K/uL Final  . Eosinophils Relative 07/01/2017 0  % Final  . Eosinophils Absolute 07/01/2017 0.0  0.0 - 0.7 K/uL Final  . Basophils Relative 07/01/2017 0  % Final  . Basophils Absolute 07/01/2017 0.0  0.0 - 0.1 K/uL Final   Performed at [facility], [address] [place], [place] [address]  . Sodium 07/01/2017 137  135 - 145 mmol/L Final  . Potassium 07/01/2017 3.8  3.5 - 5.1 mmol/L Final  . Chloride 07/01/2017 101  101 - 111 mmol/L Final  . CO2 07/01/2017 24  22 - 32 mmol/L Final  . Glucose, Bld 07/01/2017 102* 65 - 99 mg/dL Final  . BUN 07/01/2017 30* 6 - 20 mg/dL Final  . Creatinine, Ser 07/01/2017 1.25* 0.44 - 1.00 mg/dL Final  . Calcium 07/01/2017 8.7* 8.9 - 10.3 mg/dL Final  . Total Protein 07/01/2017 6.9  6.5 - 8.1 g/dL Final  . Albumin 07/01/2017 3.5  3.5 - 5.0 g/dL Final  . AST 07/01/2017 33  15 - 41 U/L Final  . ALT 07/01/2017 23  14 - 54 U/L Final  . Alkaline Phosphatase 07/01/2017 61  38 - 126 U/L Final  . Total Bilirubin 07/01/2017 0.6  0.3 - 1.2 mg/dL Final  . GFR calc non Af Amer 07/01/2017 43* >60 mL/min Final  . GFR calc Af Amer 07/01/2017 50* >60 mL/min Final   Comment: (NOTE) The eGFR has been calculated using the CKD EPI equation. This calculation has not been validated in all clinical situations. eGFR's persistently <60 mL/min signify possible Chronic Kidney Disease.   Georgiann Hahn gap 07/01/2017 12  5 - 15 Final   Performed at [facility], [address] [place], [place] [address]  . LDH 07/01/2017 144  98 - 192 U/L Final   Performed at [facility], [address], [place],  [address]     Pathology Orders Placed This Encounter  Procedures  . NM PET Image Restag (PS) Skull Base To Thigh    Standing Status:   Future    Standing Expiration Date:   07/01/2018    Order Specific Question:   If indicated for the ordered procedure, I authorize the administration of a radiopharmaceutical per Radiology protocol    Answer:   Yes    Order Specific Question:    Preferred imaging location?    Answer:   [facility]    Order Specific Question:   Radiology Contrast Protocol - do NOT remove file path    Answer:   [email or web address]  . CBC with Differential/Platelet    Standing Status:   Future    Standing Expiration Date:   07/02/2018  . Comprehensive metabolic panel    Standing Status:   Future    Standing Expiration Date:   07/02/2018  . Lactate dehydrogenase    Standing Status:   Future    Standing Expiration Date:   07/02/2018       Zoila Shutter MD

## 2017-07-01 NOTE — Patient Instructions (Addendum)
[place] at [facility] Discharge Instructions  You were seen today by Dr. Walden Field. She went over your recent labs and your blood counts are looking a bit better. She went over your last scan and discussed the findings of that scan. She would like for you to have a PET scan to reevaluate the findings of the last scan. We will get you scheduled for a PET scan here at [facility]. We will see you back for follow up in 3 months.    Thank you for choosing [place] at [facility] to provide your oncology and hematology care.  To afford each patient quality time with our provider, please arrive at least 15 minutes before your scheduled appointment time.   If you have a lab appointment with the [facility] please come in thru the  Main Entrance and check in at the main information desk  You need to re-schedule your appointment should you arrive 10 or more minutes late.  We strive to give you quality time with our providers, and arriving late affects you and other patients whose appointments are after yours.  Also, if you no show three or more times for appointments you may be dismissed from the clinic at the providers discretion.     Again, thank you for choosing [facility].  Our hope is that these requests will decrease the amount of time that you wait before being seen by our physicians.       _____________________________________________________________  Should you have questions after your visit to [facility], please contact our office at [phone] between the hours of 8:30 a.m. and 4:30 p.m.  Voicemails left after 4:30 p.m. will not be returned until the following business day.  For prescription refill requests, have your pharmacy contact our office.       Resources For Cancer Patients and their Caregivers ? American Cancer Society: Can assist with transportation, wigs, general needs, runs Look Good Feel  Better.        (712)352-0667 ? Cancer Care: Provides financial assistance, online support groups, medication/co-pay assistance.  1-800-813-HOPE 6365609709) ? [place] Assists [place] cancer patients and their families through emotional , educational and financial support.  8675098695 ? [place] DSS Where to apply for food stamps, Medicaid and utility assistance. 6123178486 ? [facility]: Transportation to medical appointments. 212 719 6621 ? Social Security Administration: May apply for disability if have a Stage IV cancer. 438-139-9154 (434)071-6362 ? [employer], Disability and Transit Services: Assists with nutrition, care and transit needs. [place] Support Programs:   > Cancer Support Group  2nd Tuesday of the month 1pm-2pm, Journey Room   > Creative Journey  3rd Tuesday of the month 1130am-1pm, Journey Room

## 2017-07-09 ENCOUNTER — Encounter (HOSPITAL_COMMUNITY): Payer: PRIVATE HEALTH INSURANCE

## 2017-07-15 ENCOUNTER — Other Ambulatory Visit (HOSPITAL_COMMUNITY): Payer: Medicare Other

## 2017-07-29 ENCOUNTER — Ambulatory Visit (HOSPITAL_COMMUNITY)
Admission: RE | Admit: 2017-07-29 | Discharge: 2017-07-29 | Disposition: A | Discharge status: Home / Self Care | Payer: Medicare Other | Source: Ambulatory Visit | Attending: Internal Medicine | Admitting: Internal Medicine

## 2017-07-29 ENCOUNTER — Ambulatory Visit (HOSPITAL_COMMUNITY)
Admission: RE | Admit: 2017-07-29 | Discharge: 2017-07-29 | Disposition: A | Discharge status: Home / Self Care | Payer: Medicare Other | Source: Ambulatory Visit | Attending: Oncology | Admitting: Oncology

## 2017-07-29 ENCOUNTER — Ambulatory Visit (HOSPITAL_COMMUNITY): Payer: Medicare Other

## 2017-07-29 ENCOUNTER — Encounter (HOSPITAL_COMMUNITY): Payer: Self-pay

## 2017-07-29 DIAGNOSIS — R9389 Abnormal findings on diagnostic imaging of other specified body structures: Secondary | ICD-10-CM | POA: Diagnosis present

## 2017-07-29 DIAGNOSIS — Z853 Personal history of malignant neoplasm of breast: Secondary | ICD-10-CM | POA: Insufficient documentation

## 2017-07-29 DIAGNOSIS — Z9012 Acquired absence of left breast and nipple: Secondary | ICD-10-CM | POA: Diagnosis not present

## 2017-07-29 DIAGNOSIS — Z1231 Encounter for screening mammogram for malignant neoplasm of breast: Secondary | ICD-10-CM | POA: Diagnosis present

## 2017-07-29 MED ORDER — FLUDEOXYGLUCOSE F - 18 (FDG) INJECTION
10.0000 | Freq: Once | INTRAVENOUS | Status: AC
  Administered 2017-07-29: 10.11 via INTRAVENOUS

## 2017-08-01 ENCOUNTER — Inpatient Hospital Stay (HOSPITAL_COMMUNITY): Payer: Medicare Other | Attending: Internal Medicine | Admitting: Hematology

## 2017-08-01 ENCOUNTER — Encounter (HOSPITAL_COMMUNITY): Payer: Self-pay | Admitting: Hematology

## 2017-08-01 ENCOUNTER — Other Ambulatory Visit: Payer: Self-pay

## 2017-08-01 DIAGNOSIS — D510 Vitamin B12 deficiency anemia due to intrinsic factor deficiency: Secondary | ICD-10-CM | POA: Diagnosis not present

## 2017-08-01 DIAGNOSIS — Z171 Estrogen receptor negative status [ER-]: Secondary | ICD-10-CM | POA: Diagnosis not present

## 2017-08-01 DIAGNOSIS — J439 Emphysema, unspecified: Secondary | ICD-10-CM | POA: Diagnosis not present

## 2017-08-01 DIAGNOSIS — F17210 Nicotine dependence, cigarettes, uncomplicated: Secondary | ICD-10-CM

## 2017-08-01 DIAGNOSIS — K219 Gastro-esophageal reflux disease without esophagitis: Secondary | ICD-10-CM | POA: Insufficient documentation

## 2017-08-01 DIAGNOSIS — Z85820 Personal history of malignant melanoma of skin: Secondary | ICD-10-CM | POA: Diagnosis not present

## 2017-08-01 DIAGNOSIS — Z853 Personal history of malignant neoplasm of breast: Secondary | ICD-10-CM

## 2017-08-01 DIAGNOSIS — Z79899 Other long term (current) drug therapy: Secondary | ICD-10-CM | POA: Diagnosis not present

## 2017-08-01 DIAGNOSIS — Z9012 Acquired absence of left breast and nipple: Secondary | ICD-10-CM | POA: Diagnosis not present

## 2017-08-01 DIAGNOSIS — E7800 Pure hypercholesterolemia, unspecified: Secondary | ICD-10-CM | POA: Diagnosis not present

## 2017-08-01 DIAGNOSIS — D473 Essential (hemorrhagic) thrombocythemia: Secondary | ICD-10-CM | POA: Diagnosis not present

## 2017-08-01 DIAGNOSIS — I110 Hypertensive heart disease with heart failure: Secondary | ICD-10-CM | POA: Diagnosis not present

## 2017-08-01 DIAGNOSIS — I712 Thoracic aortic aneurysm, without rupture: Secondary | ICD-10-CM | POA: Diagnosis not present

## 2017-08-01 DIAGNOSIS — I509 Heart failure, unspecified: Secondary | ICD-10-CM | POA: Diagnosis not present

## 2017-08-01 DIAGNOSIS — Z9221 Personal history of antineoplastic chemotherapy: Secondary | ICD-10-CM | POA: Diagnosis not present

## 2017-08-01 DIAGNOSIS — Z7982 Long term (current) use of aspirin: Secondary | ICD-10-CM | POA: Insufficient documentation

## 2017-08-01 DIAGNOSIS — I2510 Atherosclerotic heart disease of native coronary artery without angina pectoris: Secondary | ICD-10-CM | POA: Diagnosis not present

## 2017-08-01 DIAGNOSIS — C50912 Malignant neoplasm of unspecified site of left female breast: Secondary | ICD-10-CM

## 2017-08-01 MED ORDER — ONDANSETRON HCL 8 MG PO TABS: 8.0000 mg | ORAL_TABLET | Freq: Three times a day (TID) | ORAL | 5 refills | Status: DC | PRN

## 2017-08-01 MED ORDER — CLONAZEPAM 0.5 MG PO TABS: ORAL_TABLET | ORAL | 3 refills | Status: DC

## 2017-08-01 NOTE — Progress Notes (Signed)
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  CURRENT THERAPY: Hydroxyurea.  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 The 3 liver lesions are not hypermetabolic. The radiologist suspects they may be subtly present on prior CT chest from 10/2013, to further reassuring that these are likely benign lesions.       Melanoma ([place])   07/09/2013 Initial Biopsy    Initial biopsy L upper thigh/buttocks melanoma      07/20/2013 Surgery    Excision L lateral buttock/upper thigh melanoma, clear margins      07/20/2013 Pathology Results    Breslow depth 1.02 mm, pT2a          INTERVAL HISTORY:  Ms. Gerstel 67 y.o. female returns for follow-up and discussion of test results.  She is taking hydroxyurea 500 mg Monday through Friday and thousand milligrams on Saturday and Sunday.  She is tolerating it very well.  She denies any major infections.  Denies any hospitalizations.  Denies any fevers, night sweats or weight loss.  She had a mammogram and PET scan done on 07/29/2017.  She does have Raynaud's phenomena and has change in colors of her fingers.  She denies any aquagenic pruritus.  REVIEW OF SYSTEMS:  Review of Systems  Constitutional: Positive for fatigue.  All other systems reviewed and are  negative.    PAST MEDICAL/SURGICAL HISTORY:  Past Medical History:  Diagnosis Date  . 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 Arthritis   . Ascending aortic aneurysm ([place])   . Cancer [facility]) 1995   breast, left, mastectomy/chemo  . Chest pain    Possibly cardiac. No evidence of ischemia/injury based upon normal troponin I. Chest discomfort could be tachycardia induced supply demand mismatch.   . CHF (congestive heart failure) ([place]) 11/17/2015   after surgery   . Colon adenomas   . Coronary artery disease   . Dyspnea    with exertion  . Emphysema of lung ([place]) 11/17/2015  . Essential hypertension, benign   . GERD (gastroesophageal reflux disease)   . Hyperlipidemia   . Hypertension   . Melanoma ([place]) 01/09/2016  . Palpitations   . Pernicious anemia 03/06/2016  . Pure hypercholesterolemia   . Thrombocythemia, essential ([place]) 01/09/2016  . Thrombocytosis ([place])    Idiopathic   Past Surgical History:  Procedure Laterality Date  . ANTERIOR AND POSTERIOR REPAIR N/A 12/09/2014   Procedure: ANTERIOR (CYSTOCELE) AND POSTERIOR REPAIR (RECTOCELE);  Surgeon: Bjorn Loser, MD;  Location: [facility];  Service: Urology;  Laterality: N/A;  . ANTERIOR CERVICAL DECOMPRESSION/DISCECTOMY FUSION 4 LEVELS Right 10/03/2016   Procedure: ANTERIOR CERVICAL DECOMPRESSION FUSION, CERVICAL 4-5, CERVICAL 5-6, CERVICAL 6-7, CERVICAL 7  TO THORACIC 1 WITH INSTRUMENTATION AND ALLOGRAFT;  Surgeon: Phylliss Bob, MD;  Location: [facility];  Service: Orthopedics;  Laterality: Right;  ANTERIOR CERVICAL DECOMPRESSION FUSION, CERVICAL 4-5, CERVICAL 5-6, CERVICAL 6-7, CERVICAL 7 TO THORACIC 1 WITH INSTRUMENTATION AND ALLOGRAFT; REQUEST 4 HO  . ANTERIOR LAT LUMBAR FUSION Left 11/16/2015   Procedure: LEFT SIDED LATERAL INTERBODY FUSION, LUMBAR 2-3, LUMBAR 3-4, LUMBAR 4-5 WITH INSTRUMENTATION;  Surgeon: Phylliss Bob, MD;  Location: [facility];  Service: Orthopedics;  Laterality: Left;  LEFT SIDED LATEARL INTERBODY FUSION, LUMBAR  2-3, LUMBAR 3-4, LUMBAR 4-5 WITH INSTRUMENTATION   . APPENDECTOMY    . BONE MARROW ASPIRATION  07/2012  . BONE MARROW BIOPSY  07/2012  . BREAST SURGERY    . CARDIAC CATHETERIZATION    . CARDIAC CATHETERIZATION N/A 01/20/2016   Procedure: Left Heart Cath and Coronary Angiography;  Surgeon: Burnell Blanks, MD;  Location: [facility];  Service: Cardiovascular;  Laterality: N/A;  . COLONOSCOPY  11/29/2010   Procedure: COLONOSCOPY;  Surgeon: Rogene Houston, MD;  [facility];  Service: Endoscopy;  Laterality: N/A;  . COLONOSCOPY N/A 02/18/2014   Procedure: COLONOSCOPY;  Surgeon: Rogene Houston, MD;  [facility];  Service: Endoscopy;  Laterality: N/A;  1030  . COLONOSCOPY N/A 02/25/2017   Procedure: COLONOSCOPY;  Surgeon: Rogene Houston, MD;  [facility];  Service: Endoscopy;  Laterality: N/A;  10:55  . CYSTOSCOPY N/A 12/09/2014   Procedure: CYSTOSCOPY;  Surgeon: Bjorn Loser, MD;  Location: [facility];  Service: Urology;  Laterality: N/A;  . ESOPHAGEAL DILATION N/A 02/25/2017   Procedure: ESOPHAGEAL DILATION;  Surgeon: Rogene Houston, MD;  [facility];  Service: Endoscopy;  Laterality: N/A;  . ESOPHAGOGASTRODUODENOSCOPY N/A 02/25/2017   Procedure: ESOPHAGOGASTRODUODENOSCOPY (EGD);  Surgeon: Rogene Houston, MD;  [facility];  Service: Endoscopy;  Laterality: N/A;  . IR GENERIC HISTORICAL  01/11/2016   IR RADIOLOGY PERIPHERAL GUIDED IV START 01/11/2016 Saverio Danker, PA-C [facility]  . IR GENERIC HISTORICAL  01/11/2016   IR US GUIDE VASC ACCESS RIGHT 01/11/2016 Saverio Danker, PA-C [facility]  . MASTECTOMY     left  . OVARIAN CYST SURGERY     x2  . POLYPECTOMY  02/25/2017   Procedure: POLYPECTOMY;  Surgeon: Rogene Houston, MD;  [facility];  Service: Endoscopy;;  . SALPINGOOPHORECTOMY Bilateral 12/09/2014   Procedure: SALPINGO OOPHORECTOMY;  Surgeon: Servando Salina, MD;  Location: [facility];  Service:  Gynecology;  Laterality: Bilateral;  . TUBAL LIGATION    . VAGINAL HYSTERECTOMY N/A 12/09/2014   Procedure: HYSTERECTOMY VAGINAL;  Surgeon: Servando Salina, MD;  Location: [facility];  Service: Gynecology;  Laterality: N/A;     SOCIAL HISTORY:  Social History   Socioeconomic History  . Marital status: Divorced    Spouse name: Not on file  . Number of children: 2  . Years of education: Not on file  . Highest education level: Not on file  Occupational History  . Not on file  Social Needs  . Financial resource strain: Not on file  . Food insecurity:    Worry: Not on file    Inability: Not on file  . Transportation needs:    Medical: Not on file    Non-medical: Not on file  Tobacco Use  . Smoking status: Current Every Day Smoker    Packs/day: 0.50    Years: 50.00    Pack years: 25.00    Types:  Cigarettes  . Smokeless tobacco: Never Used  Substance and Sexual Activity  . Alcohol use: No  . Drug use: No  . Sexual activity: Yes    Birth control/protection: Post-menopausal  Lifestyle  . Physical activity:    Days per week: Not on file    Minutes per session: Not on file  . Stress: Not on file  Relationships  . Social connections:    Talks on phone: Not on file    Gets together: Not on file    Attends religious service: Not on file    Active member of club or organization: Not on file    Attends meetings of clubs or organizations: Not on file    Relationship status: Not on file  . Intimate partner violence:    Fear of current or ex partner: Not on file    Emotionally abused: Not on file    Physically abused: Not on file    Forced sexual activity: Not on file  Other Topics Concern  . Not on file  Social History Narrative  . Not on file    FAMILY HISTORY:  Family History  Problem Relation Age of Onset  . Hypertension Mother   . Heart failure Mother   . Congestive Heart Failure Mother   . COPD Mother   . Pernicious anemia Mother   . Cancer Mother        lung   . Hypertension Father   . CAD Father   . Heart attack Father   . Hypertension Sister   . Cancer Other   . Celiac disease Other     CURRENT MEDICATIONS:  Outpatient Encounter Medications as of 08/01/2017  Medication Sig  . albuterol (PROVENTIL HFA;VENTOLIN HFA) 108 (90 Base) MCG/ACT inhaler Inhale 2 puffs into the lungs every 6 (six) hours as needed for wheezing or shortness of breath.  Marland Kitchen aspirin EC 81 MG tablet Take 81 mg by mouth daily.  . Calcium Carb-Cholecalciferol (CALCIUM 600+D) 600-800 MG-UNIT TABS Take 1 tablet by mouth 2 (two) times daily.  . Cholecalciferol (VITAMIN D-3) 1000 units CAPS Take 1,000 Units daily by mouth.   . clonazePAM (KLONOPIN) 0.5 MG tablet TAKE (1) TABLET BY MOUTH AT BEDTIME.  . cyanocobalamin (,VITAMIN B-12,) 1000 MCG/ML injection INJECT 1 ML INTO THE MUSCLE ONCE MONTHLY AS DIRECTED.  . furosemide (LASIX) 40 MG tablet Take 40 mg daily by mouth.   . gabapentin (NEURONTIN) 300 MG capsule Take 300 mg by mouth at bedtime.  . hydroxyurea (HYDREA) 500 MG capsule 500 mg once daily Mondays thru friday and 1000 mg once daily on Saturday and Sunday.  . ibandronate (BONIVA) 150 MG tablet Take 1 tablet by mouth every 30 (thirty) days.  Marland Kitchen ibuprofen (ADVIL,MOTRIN) 800 MG tablet Take 800 mg every 8 (eight) hours as needed by mouth for moderate pain.  Marland Kitchen ipratropium-albuterol (DUONEB) 0.5-2.5 (3) MG/3ML SOLN Take 3 mLs by nebulization every 6 (six) hours as needed (shortness of breath).  . irbesartan (AVAPRO) 75 MG tablet Take 75 mg at bedtime by mouth.   . levofloxacin (LEVAQUIN) 500 MG tablet   . methocarbamol (ROBAXIN) 500 MG tablet Take 500 mg daily as needed by mouth for muscle spasms.   . ondansetron (ZOFRAN) 8 MG tablet Take 1 tablet (8 mg total) by mouth every 8 (eight) hours as needed for nausea or vomiting.  . pantoprazole (PROTONIX) 40 MG tablet Take 40 mg daily by mouth.  . potassium chloride SA (K-DUR,KLOR-CON) 20 MEQ tablet Take 1 tablet (20  mEq total) by mouth  3 (three) times daily.  . predniSONE (DELTASONE) 20 MG tablet   . rosuvastatin (CRESTOR) 40 MG tablet Take 40 mg at bedtime by mouth.   . tobramycin-dexamethasone (TOBRADEX) ophthalmic solution   . [DISCONTINUED] clonazePAM (KLONOPIN) 0.5 MG tablet TAKE (1) TABLET BY MOUTH AT BEDTIME.  . [DISCONTINUED] ondansetron (ZOFRAN) 8 MG tablet Take 8 mg by mouth every 8 (eight) hours as needed for nausea or vomiting.  . [DISCONTINUED] traMADol (ULTRAM) 50 MG tablet Take 50-100 mg by mouth 3 (three) times daily as needed (pain).    No facility-administered encounter medications on file as of 08/01/2017.     ALLERGIES:  Allergies  Allergen Reactions  . Penicillins Hives and Rash    Has patient had a PCN reaction causing immediate rash, facial/tongue/throat swelling, SOB or lightheadedness with hypotension: Yes Has patient had a PCN reaction causing severe rash involving mucus membranes or skin necrosis: Yes Has patient had a PCN reaction that required hospitalization No Has patient had a PCN reaction occurring within the last 10 years: Yes If all of the above answers are "NO", then may proceed with Cephalosporin use.   . Tape Rash    Medipore, Coban, and paper tape CAN be tolerated     PHYSICAL EXAM:  ECOG Performance status: 1 I have reviewed vital signs for this patient.   LABORATORY DATA:  I have reviewed the labs as listed.  CBC    Component Value Date/Time   WBC 10.1 07/01/2017 1035   RBC 3.10 (L) 07/01/2017 1035   HGB 10.9 (L) 07/01/2017 1035   HCT 33.1 (L) 07/01/2017 1035   PLT 521 (H) 07/01/2017 1035   MCV 106.8 (H) 07/01/2017 1035   MCH 35.2 (H) 07/01/2017 1035   MCHC 32.9 07/01/2017 1035   RDW 16.8 (H) 07/01/2017 1035   LYMPHSABS 1.5 07/01/2017 1035   MONOABS 0.9 07/01/2017 1035   EOSABS 0.0 07/01/2017 1035   BASOSABS 0.0 07/01/2017 1035   CMP Latest Ref Rng & Units 07/01/2017 05/01/2017 02/15/2017  Glucose 65 - 99 mg/dL 102(H) 121(H) 111(H)  BUN 6 - 20 mg/dL 30(H) 16  18  Creatinine 0.44 - 1.00 mg/dL 1.25(H) 1.44(H) 1.39(H)  Sodium 135 - 145 mmol/L 137 137 139  Potassium 3.5 - 5.1 mmol/L 3.8 3.5 3.7  Chloride 101 - 111 mmol/L 101 102 104  CO2 22 - 32 mmol/L '24 24 25  ' Calcium 8.9 - 10.3 mg/dL 8.7(L) 8.8(L) 9.2  Total Protein 6.5 - 8.1 g/dL 6.9 7.1 7.0  Total Bilirubin 0.3 - 1.2 mg/dL 0.6 0.3 0.7  Alkaline Phos 38 - 126 U/L 61 57 57  AST 15 - 41 U/L 33 27 27  ALT 14 - 54 U/L 23 16 11(L)       DIAGNOSTIC IMAGING:  I have independently reviewed PET/CT scan which did not show any evidence of metastatic disease.  I have also reviewed her mammogram which was BI-RADS Category 1 done on 07/29/2017.     ASSESSMENT & PLAN:   Thrombocythemia, essential ([place]) 1.  Essential thrombocytosis: -She is taking 500 mg hydroxyurea Monday through Friday and thousand milligrams on Saturday and Sunday.  She is tolerating it very well.  She denies any vasomotor symptoms.  She has Raynard's phenomena.  She has a mildly elevated MCV from hydroxyurea.  Her last platelet count on 07/01/2017 was 521.  She has an appointment to see as in June for repeat blood counts.  2.  Left breast cancer: -I discussed the  results of right breast mammogram dated 07/29/2017 which was BI-RADS Category 1. -PET/CT scan results were also discussed from 07/29/2017 which did not show any metastatic disease.  3.  Melanoma: - She also had a melanoma resected from left upper thigh in 2015.  She follows up with her dermatologist Dr. Nevada Crane.  No evidence of metastasis on PET scan.      Orders placed this encounter:  Orders Placed This Encounter  Procedures  . CBC with Differential  . Comprehensive metabolic panel  . Lactate dehydrogenase      Derek Jack, MD [place] [phone]

## 2017-08-01 NOTE — Patient Instructions (Signed)
[facility] Discharge Instructions  Today you saw Dr. Katragadda   Thank you for choosing [facility] at [facility] to provide your oncology and hematology care.  To afford each patient quality time with our provider, please arrive at least 15 minutes before your scheduled appointment time.   If you have a lab appointment with the Cancer Center please come in thru the  Main Entrance and check in at the main information desk  You need to re-schedule your appointment should you arrive 10 or more minutes late.  We strive to give you quality time with our providers, and arriving late affects you and other patients whose appointments are after yours.  Also, if you no show three or more times for appointments you may be dismissed from the clinic at the providers discretion.     Again, thank you for choosing [facility].  Our hope is that these requests will decrease the amount of time that you wait before being seen by our physicians.       _____________________________________________________________  Should you have questions after your visit to [facility], please contact our office at [phone] between the hours of 8:30 a.m. and 4:30 p.m.  Voicemails left after 4:30 p.m. will not be returned until the following business day.  For prescription refill requests, have your pharmacy contact our office.       Resources For Cancer Patients and their Caregivers ? American Cancer Society: Can assist with transportation, wigs, general needs, runs Look Good Feel Better.        1-888-227-6333 ? Cancer Care: Provides financial assistance, online support groups, medication/co-pay assistance.  1-800-813-HOPE (4673) ? [facility] Assists [place] cancer patients and their families through emotional , educational and financial support.  336-427-4357 ? [place] DSS Where to apply for  food stamps, Medicaid and utility assistance. 336-342-1394 ? [facility]: Transportation to medical appointments. 336-347-2287 ? Social Security Administration: May apply for disability if have a Stage IV cancer. 336-342-7796 1-800-772-1213 ? [place] Aging, Disability and Transit Services: Assists with nutrition, care and transit needs. 336-349-2343  Cancer Center Support Programs:   > Cancer Support Group  2nd Tuesday of the month 1pm-2pm, Journey Room   > Creative Journey  3rd Tuesday of the month 1130am-1pm, Journey Room    

## 2017-08-01 NOTE — Assessment & Plan Note (Addendum)
1.  Essential thrombocytosis: -She is taking 500 mg hydroxyurea Monday through Friday and thousand milligrams on Saturday and Sunday.  She is tolerating it very well.  She denies any vasomotor symptoms.  She has Raynard's phenomena.  She has a mildly elevated MCV from hydroxyurea.  Her last platelet count on 07/01/2017 was 521.  She has an appointment to see as in June for repeat blood counts.  2.  Left breast cancer: -I discussed the results of right breast mammogram dated 07/29/2017 which was BI-RADS Category 1. -PET/CT scan results were also discussed from 07/29/2017 which did not show any metastatic disease.  3.  Melanoma: - She also had a melanoma resected from left upper thigh in 2015.  She follows up with her dermatologist Dr. Nevada Crane.  No evidence of metastasis on PET scan.

## 2017-08-07 ENCOUNTER — Other Ambulatory Visit (HOSPITAL_COMMUNITY): Payer: Self-pay | Admitting: Adult Health

## 2017-08-07 DIAGNOSIS — C50912 Malignant neoplasm of unspecified site of left female breast: Secondary | ICD-10-CM

## 2017-08-08 ENCOUNTER — Other Ambulatory Visit (HOSPITAL_COMMUNITY): Payer: Self-pay | Admitting: Adult Health

## 2017-08-08 NOTE — Telephone Encounter (Signed)
Just being sure you received this.Marland Kitchen

## 2017-08-08 NOTE — Telephone Encounter (Signed)
Looks like Dr. Delton Coombes refilled this on 08/01/17.  Please ensure pharmacy received refill.   gwd

## 2017-08-09 NOTE — Telephone Encounter (Signed)
He evidently had printed this and no one ever followed up. I found prescription at front desk, had him sign it and faxed to pharmacy.

## 2017-09-17 ENCOUNTER — Inpatient Hospital Stay (HOSPITAL_COMMUNITY): Payer: Medicare Other | Attending: Hematology

## 2017-09-17 DIAGNOSIS — Z79899 Other long term (current) drug therapy: Secondary | ICD-10-CM | POA: Insufficient documentation

## 2017-09-17 DIAGNOSIS — Z853 Personal history of malignant neoplasm of breast: Secondary | ICD-10-CM | POA: Insufficient documentation

## 2017-09-17 DIAGNOSIS — D473 Essential (hemorrhagic) thrombocythemia: Secondary | ICD-10-CM | POA: Insufficient documentation

## 2017-09-17 DIAGNOSIS — Z85820 Personal history of malignant melanoma of skin: Secondary | ICD-10-CM | POA: Diagnosis not present

## 2017-09-17 LAB — COMPREHENSIVE METABOLIC PANEL
ALBUMIN: 3.7 g/dL (ref 3.5–5.0)
ALT: 15 U/L (ref 14–54)
ANION GAP: 8 (ref 5–15)
AST: 25 U/L (ref 15–41)
Alkaline Phosphatase: 62 U/L (ref 38–126)
BUN: 20 mg/dL (ref 6–20)
CHLORIDE: 103 mmol/L (ref 101–111)
CO2: 26 mmol/L (ref 22–32)
Calcium: 8.9 mg/dL (ref 8.9–10.3)
Creatinine, Ser: 0.98 mg/dL (ref 0.44–1.00)
GFR calc Af Amer: >60 mL/min (ref >60)
GFR calc non Af Amer: 58 mL/min — ABNORMAL LOW (ref >60)
GLUCOSE: 141 mg/dL — AB (ref 65–99)
POTASSIUM: 3.6 mmol/L (ref 3.5–5.1)
Sodium: 137 mmol/L (ref 135–145)
Total Bilirubin: 0.5 mg/dL (ref 0.3–1.2)
Total Protein: 7 g/dL (ref 6.5–8.1)

## 2017-09-17 LAB — CBC WITH DIFFERENTIAL/PLATELET
BASOS ABS: 0.1 10*3/uL (ref 0.0–0.1)
Basophils Relative: 1 %
EOS ABS: 0.2 10*3/uL (ref 0.0–0.7)
Eosinophils Relative: 3 %
HCT: 31.7 % — ABNORMAL LOW (ref 36.0–46.0)
Hemoglobin: 10.2 g/dL — ABNORMAL LOW (ref 12.0–15.0)
LYMPHS ABS: 1.3 10*3/uL (ref 0.7–4.0)
Lymphocytes Relative: 24 %
MCH: 37.1 pg — ABNORMAL HIGH (ref 26.0–34.0)
MCHC: 32.2 g/dL (ref 30.0–36.0)
MCV: 115.3 fL — ABNORMAL HIGH (ref 78.0–100.0)
MONO ABS: 0.4 10*3/uL (ref 0.1–1.0)
Monocytes Relative: 8 %
Neutro Abs: 3.6 10*3/uL (ref 1.7–7.7)
Neutrophils Relative %: 64 %
PLATELETS: 415 10*3/uL — AB (ref 150–400)
RBC: 2.75 MIL/uL — AB (ref 3.87–5.11)
RDW: 16 % — AB (ref 11.5–15.5)
WBC: 5.6 10*3/uL (ref 4.0–10.5)

## 2017-09-17 LAB — LACTATE DEHYDROGENASE: LDH: 174 U/L (ref 98–192)

## 2017-09-24 ENCOUNTER — Inpatient Hospital Stay (HOSPITAL_BASED_OUTPATIENT_CLINIC_OR_DEPARTMENT_OTHER): Payer: Medicare Other | Admitting: Hematology

## 2017-09-24 ENCOUNTER — Other Ambulatory Visit: Payer: Self-pay

## 2017-09-24 ENCOUNTER — Encounter (HOSPITAL_COMMUNITY): Payer: Self-pay | Admitting: Hematology

## 2017-09-24 VITALS — BP 94/65 | HR 66 | Temp 98.6°F | Resp 18 | Wt 108.8 lb

## 2017-09-24 DIAGNOSIS — D473 Essential (hemorrhagic) thrombocythemia: Secondary | ICD-10-CM | POA: Diagnosis not present

## 2017-09-24 NOTE — Assessment & Plan Note (Signed)
1.  Essential thrombocytosis: -She is taking 500 mg hydroxyurea Monday through Friday and thousand milligrams on Saturday and Sunday.  She is tolerating it very well.  She denies any vasomotor symptoms.  She has Raynard's phenomena.  She has elevated MCV from hydroxyurea.  I discussed the results of the CBC which shows platelet count of 415.  Hematocrit is 31.7.  We plan to see her back in 4 months for follow-up.  2.  Left breast cancer: -I discussed the results of right breast mammogram dated 07/29/2017 which was BI-RADS Category 1. -PET/CT scan results were also discussed from 07/29/2017 which did not show any metastatic disease.  3.  Melanoma: - She also had a melanoma resected from left upper thigh in 2015.  She follows up with her dermatologist Dr. Nevada Crane.  No evidence of metastasis on PET scan.

## 2017-09-24 NOTE — Patient Instructions (Signed)
[facility] at [facility] Discharge Instructions  Today you saw Dr. K.   Thank you for choosing  Cancer Center at [facility] to provide your oncology and hematology care.  To afford each patient quality time with our provider, please arrive at least 15 minutes before your scheduled appointment time.   If you have a lab appointment with the Cancer Center please come in thru the  Main Entrance and check in at the main information desk  You need to re-schedule your appointment should you arrive 10 or more minutes late.  We strive to give you quality time with our providers, and arriving late affects you and other patients whose appointments are after yours.  Also, if you no show three or more times for appointments you may be dismissed from the clinic at the providers discretion.     Again, thank you for choosing [facility].  Our hope is that these requests will decrease the amount of time that you wait before being seen by our physicians.       _____________________________________________________________  Should you have questions after your visit to [facility], please contact our office at [phone] between the hours of 8:30 a.m. and 4:30 p.m.  Voicemails left after 4:30 p.m. will not be returned until the following business day.  For prescription refill requests, have your pharmacy contact our office.       Resources For Cancer Patients and their Caregivers ? American Cancer Society: Can assist with transportation, wigs, general needs, runs Look Good Feel Better.        1-888-227-6333 ? Cancer Care: Provides financial assistance, online support groups, medication/co-pay assistance.  1-800-813-HOPE (4673) ? [facility] Assists [place] cancer patients and their families through emotional , educational and financial support.  336-427-4357 ? [place] DSS Where to apply for food  stamps, Medicaid and utility assistance. 336-342-1394 ? [facility]: Transportation to medical appointments. 336-347-2287 ? Social Security Administration: May apply for disability if have a Stage IV cancer. 336-342-7796 1-800-772-1213 ? [place] Aging, Disability and Transit Services: Assists with nutrition, care and transit needs. 336-349-2343  Cancer Center Support Programs:   > Cancer Support Group  2nd Tuesday of the month 1pm-2pm, Journey Room   > Creative Journey  3rd Tuesday of the month 1130am-1pm, Journey Room    

## 2017-09-24 NOTE — Progress Notes (Signed)
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  CURRENT THERAPY: Hydroxyurea.  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 The 3 liver lesions are not hypermetabolic. The radiologist suspects they may be subtly present on prior CT chest from 10/2013, to further reassuring that these are likely benign lesions.       Melanoma ([place])   07/09/2013 Initial Biopsy    Initial biopsy L upper thigh/buttocks melanoma      07/20/2013 Surgery    Excision L lateral buttock/upper thigh melanoma, clear margins      07/20/2013 Pathology Results    Breslow depth 1.02 mm, pT2a          INTERVAL HISTORY:  Ashley [name] 67 y.o. female returns for follow-up and discussion of test results.  She is taking hydroxyurea 500 mg Monday through Friday and thousand milligrams on Saturday and Sunday.  She is tolerating it very well.  She denies any major infections.  Denies any hospitalizations.  Denies any fevers, night sweats or weight loss. She does have Raynaud's phenomena and has change in colors of her fingers.  She denies any aquagenic pruritus.  REVIEW OF SYSTEMS:  Review of Systems  HENT:   Positive for trouble swallowing.   Gastrointestinal: Positive for constipation.  All other systems reviewed and are  negative.    PAST MEDICAL/SURGICAL HISTORY:  Past Medical History:  Diagnosis Date  . 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 Arthritis   . Ascending aortic aneurysm ([place])   . Cancer [facility]) 1995   breast, left, mastectomy/chemo  . Chest pain    Possibly cardiac. No evidence of ischemia/injury based upon normal troponin I. Chest discomfort could be tachycardia induced supply demand mismatch.   . CHF (congestive heart failure) ([place]) 11/17/2015   after surgery   . Colon adenomas   . Coronary artery disease   . Dyspnea    with exertion  . Emphysema of lung ([place]) 11/17/2015  . Essential hypertension, benign   . GERD (gastroesophageal reflux disease)   . Hyperlipidemia   . Hypertension   . Melanoma ([place]) 01/09/2016  . Palpitations   . Pernicious anemia 03/06/2016  . Pure hypercholesterolemia   . Thrombocythemia, essential ([place]) 01/09/2016  . Thrombocytosis ([place])    Idiopathic   Past Surgical History:  Procedure Laterality Date  . ANTERIOR AND POSTERIOR REPAIR N/A 12/09/2014   Procedure: ANTERIOR (CYSTOCELE) AND POSTERIOR REPAIR (RECTOCELE);  Surgeon: Bjorn Loser, MD;  Location: [facility];  Service: Urology;  Laterality: N/A;  . ANTERIOR CERVICAL DECOMPRESSION/DISCECTOMY FUSION 4 LEVELS Right 10/03/2016   Procedure: ANTERIOR CERVICAL DECOMPRESSION FUSION, CERVICAL 4-5, CERVICAL 5-6, CERVICAL 6-7, CERVICAL 7 TO THORACIC 1  WITH INSTRUMENTATION AND ALLOGRAFT;  Surgeon: Phylliss Bob, MD;  Location: [facility];  Service: Orthopedics;  Laterality: Right;  ANTERIOR CERVICAL DECOMPRESSION FUSION, CERVICAL 4-5, CERVICAL 5-6, CERVICAL 6-7, CERVICAL 7 TO THORACIC 1 WITH INSTRUMENTATION AND ALLOGRAFT; REQUEST 4 HO  . ANTERIOR LAT LUMBAR FUSION Left 11/16/2015   Procedure: LEFT SIDED LATERAL INTERBODY FUSION, LUMBAR 2-3, LUMBAR 3-4, LUMBAR 4-5 WITH INSTRUMENTATION;  Surgeon: Phylliss Bob, MD;  Location: [facility];  Service: Orthopedics;  Laterality: Left;  LEFT SIDED LATEARL INTERBODY FUSION, LUMBAR  2-3, LUMBAR 3-4, LUMBAR 4-5 WITH INSTRUMENTATION   . APPENDECTOMY    . BONE MARROW ASPIRATION  07/2012  . BONE MARROW BIOPSY  07/2012  . BREAST SURGERY    . CARDIAC CATHETERIZATION    . CARDIAC CATHETERIZATION N/A 01/20/2016   Procedure: Left Heart Cath and Coronary Angiography;  Surgeon: Burnell Blanks, MD;  Location: [facility];  Service: Cardiovascular;  Laterality: N/A;  . COLONOSCOPY  11/29/2010   Procedure: COLONOSCOPY;  Surgeon: Rogene Houston, MD;  [facility];  Service: Endoscopy;  Laterality: N/A;  . COLONOSCOPY N/A 02/18/2014   Procedure: COLONOSCOPY;  Surgeon: Rogene Houston, MD;  [facility];  Service: Endoscopy;  Laterality: N/A;  1030  . COLONOSCOPY N/A 02/25/2017   Procedure: COLONOSCOPY;  Surgeon: Rogene Houston, MD;  [facility];  Service: Endoscopy;  Laterality: N/A;  10:55  . CYSTOSCOPY N/A 12/09/2014   Procedure: CYSTOSCOPY;  Surgeon: Bjorn Loser, MD;  Location: [facility];  Service: Urology;  Laterality: N/A;  . ESOPHAGEAL DILATION N/A 02/25/2017   Procedure: ESOPHAGEAL DILATION;  Surgeon: Rogene Houston, MD;  [facility];  Service: Endoscopy;  Laterality: N/A;  . ESOPHAGOGASTRODUODENOSCOPY N/A 02/25/2017   Procedure: ESOPHAGOGASTRODUODENOSCOPY (EGD);  Surgeon: Rogene Houston, MD;  [facility];  Service: Endoscopy;  Laterality: N/A;  . IR GENERIC HISTORICAL  01/11/2016   IR RADIOLOGY PERIPHERAL GUIDED IV START 01/11/2016 Saverio Danker, PA-C [facility]  . IR GENERIC HISTORICAL  01/11/2016   IR US GUIDE VASC ACCESS RIGHT 01/11/2016 Saverio Danker, PA-C [facility]  . MASTECTOMY     left  . OVARIAN CYST SURGERY     x2  . POLYPECTOMY  02/25/2017   Procedure: POLYPECTOMY;  Surgeon: Rogene Houston, MD;  [facility];  Service: Endoscopy;;  . SALPINGOOPHORECTOMY Bilateral 12/09/2014   Procedure: SALPINGO OOPHORECTOMY;  Surgeon: Servando Salina, MD;  Location: [facility];  Service:  Gynecology;  Laterality: Bilateral;  . TUBAL LIGATION    . VAGINAL HYSTERECTOMY N/A 12/09/2014   Procedure: HYSTERECTOMY VAGINAL;  Surgeon: Servando Salina, MD;  Location: [facility];  Service: Gynecology;  Laterality: N/A;     SOCIAL HISTORY:  Social History   Socioeconomic History  . Marital status: Divorced    Spouse name: Not on file  . Number of children: 2  . Years of education: Not on file  . Highest education level: Not on file  Occupational History  . Not on file  Social Needs  . Financial resource strain: Not on file  . Food insecurity:    Worry: Not on file    Inability: Not on file  . Transportation needs:    Medical: Not on file    Non-medical: Not on file  Tobacco Use  . Smoking status: Current Every Day Smoker    Packs/day: 0.50    Years: 50.00    Pack years: 25.00    Types: Cigarettes  .  Smokeless tobacco: Never Used  Substance and Sexual Activity  . Alcohol use: No  . Drug use: No  . Sexual activity: Yes    Birth control/protection: Post-menopausal  Lifestyle  . Physical activity:    Days per week: Not on file    Minutes per session: Not on file  . Stress: Not on file  Relationships  . Social connections:    Talks on phone: Not on file    Gets together: Not on file    Attends religious service: Not on file    Active member of club or organization: Not on file    Attends meetings of clubs or organizations: Not on file    Relationship status: Not on file  . Intimate partner violence:    Fear of current or ex partner: Not on file    Emotionally abused: Not on file    Physically abused: Not on file    Forced sexual activity: Not on file  Other Topics Concern  . Not on file  Social History Narrative  . Not on file    FAMILY HISTORY:  Family History  Problem Relation Age of Onset  . Hypertension Mother   . Heart failure Mother   . Congestive Heart Failure Mother   . COPD Mother   . Pernicious anemia Mother   . Cancer Mother        lung   . Hypertension Father   . CAD Father   . Heart attack Father   . Hypertension Sister   . Cancer Other   . Celiac disease Other     CURRENT MEDICATIONS:  Outpatient Encounter Medications as of 09/24/2017  Medication Sig  . albuterol (PROVENTIL HFA;VENTOLIN HFA) 108 (90 Base) MCG/ACT inhaler Inhale 2 puffs into the lungs every 6 (six) hours as needed for wheezing or shortness of breath.  Marland Kitchen aspirin EC 81 MG tablet Take 81 mg by mouth daily.  . Calcium Carb-Cholecalciferol (CALCIUM 600+D) 600-800 MG-UNIT TABS Take 1 tablet by mouth 2 (two) times daily.  . Cholecalciferol (VITAMIN D-3) 1000 units CAPS Take 1,000 Units daily by mouth.   . clonazePAM (KLONOPIN) 0.5 MG tablet TAKE (1) TABLET BY MOUTH AT BEDTIME.  . cyanocobalamin (,VITAMIN B-12,) 1000 MCG/ML injection INJECT 1 ML INTO THE MUSCLE ONCE MONTHLY AS DIRECTED.  . furosemide (LASIX) 40 MG tablet Take 40 mg daily by mouth.   . gabapentin (NEURONTIN) 300 MG capsule Take 300 mg by mouth at bedtime.  . hydroxyurea (HYDREA) 500 MG capsule 500 mg once daily Mondays thru friday and 1000 mg once daily on Saturday and Sunday.  . ibandronate (BONIVA) 150 MG tablet Take 1 tablet by mouth every 30 (thirty) days.  Marland Kitchen ibuprofen (ADVIL,MOTRIN) 800 MG tablet Take 800 mg every 8 (eight) hours as needed by mouth for moderate pain.  Marland Kitchen ipratropium-albuterol (DUONEB) 0.5-2.5 (3) MG/3ML SOLN Take 3 mLs by nebulization every 6 (six) hours as needed (shortness of breath).  . irbesartan (AVAPRO) 75 MG tablet Take 75 mg at bedtime by mouth.   . methocarbamol (ROBAXIN) 500 MG tablet Take 500 mg daily as needed by mouth for muscle spasms.   . ondansetron (ZOFRAN) 8 MG tablet Take 1 tablet (8 mg total) by mouth every 8 (eight) hours as needed for nausea or vomiting.  . pantoprazole (PROTONIX) 40 MG tablet Take 40 mg daily by mouth.  . potassium chloride SA (K-DUR,KLOR-CON) 20 MEQ tablet Take 1 tablet (20 mEq total) by mouth 3 (three) times daily.  . predniSONE  (  DELTASONE) 20 MG tablet   . rosuvastatin (CRESTOR) 40 MG tablet Take 40 mg at bedtime by mouth.   . [DISCONTINUED] levofloxacin (LEVAQUIN) 500 MG tablet   . [DISCONTINUED] tobramycin-dexamethasone (TOBRADEX) ophthalmic solution    No facility-administered encounter medications on file as of 09/24/2017.     ALLERGIES:  Allergies  Allergen Reactions  . Penicillins Hives and Rash    Has patient had a PCN reaction causing immediate rash, facial/tongue/throat swelling, SOB or lightheadedness with hypotension: Yes Has patient had a PCN reaction causing severe rash involving mucus membranes or skin necrosis: Yes Has patient had a PCN reaction that required hospitalization No Has patient had a PCN reaction occurring within the last 10 years: Yes If all of the above answers are "NO", then may proceed with Cephalosporin use.   . Tape Rash    Medipore, Coban, and paper tape CAN be tolerated     PHYSICAL EXAM:  ECOG Performance status: 1 I have reviewed vital signs for this patient.   LABORATORY DATA:  I have reviewed the labs as listed.  CBC    Component Value Date/Time   WBC 5.6 09/17/2017 1300   RBC 2.75 (L) 09/17/2017 1300   HGB 10.2 (L) 09/17/2017 1300   HCT 31.7 (L) 09/17/2017 1300   PLT 415 (H) 09/17/2017 1300   MCV 115.3 (H) 09/17/2017 1300   MCH 37.1 (H) 09/17/2017 1300   MCHC 32.2 09/17/2017 1300   RDW 16.0 (H) 09/17/2017 1300   LYMPHSABS 1.3 09/17/2017 1300   MONOABS 0.4 09/17/2017 1300   EOSABS 0.2 09/17/2017 1300   BASOSABS 0.1 09/17/2017 1300   CMP Latest Ref Rng & Units 09/17/2017 07/01/2017 05/01/2017  Glucose 65 - 99 mg/dL 141(H) 102(H) 121(H)  BUN 6 - 20 mg/dL 20 30(H) 16  Creatinine 0.44 - 1.00 mg/dL 0.98 1.25(H) 1.44(H)  Sodium 135 - 145 mmol/L 137 137 137  Potassium 3.5 - 5.1 mmol/L 3.6 3.8 3.5  Chloride 101 - 111 mmol/L 103 101 102  CO2 22 - 32 mmol/L '26 24 24  ' Calcium 8.9 - 10.3 mg/dL 8.9 8.7(L) 8.8(L)  Total Protein 6.5 - 8.1 g/dL 7.0 6.9 7.1  Total  Bilirubin 0.3 - 1.2 mg/dL 0.5 0.6 0.3  Alkaline Phos 38 - 126 U/L 62 61 57  AST 15 - 41 U/L 25 33 27  ALT 14 - 54 U/L '15 23 16       ' DIAGNOSTIC IMAGING:  I have independently reviewed PET/CT scan which did not show any evidence of metastatic disease.  I have also reviewed her mammogram which was BI-RADS Category 1 done on 07/29/2017.     ASSESSMENT & PLAN:   Thrombocythemia, essential ([place]) 1.  Essential thrombocytosis: -She is taking 500 mg hydroxyurea Monday through Friday and thousand milligrams on Saturday and Sunday.  She is tolerating it very well.  She denies any vasomotor symptoms.  She has Raynard's phenomena.  She has elevated MCV from hydroxyurea.  I discussed the results of the CBC which shows platelet count of 415.  Hematocrit is 31.7.  We plan to see her back in 4 months for follow-up.  2.  Left breast cancer: -I discussed the results of right breast mammogram dated 07/29/2017 which was BI-RADS Category 1. -PET/CT scan results were also discussed from 07/29/2017 which did not show any metastatic disease.  3.  Melanoma: - She also had a melanoma resected from left upper thigh in 2015.  She follows up with her dermatologist Dr. Nevada Crane.  No evidence  of metastasis on PET scan.      Orders placed this encounter:  Orders Placed This Encounter  Procedures  . CBC with Differential  . Comprehensive metabolic panel  . Vitamin B12  . Folate  . Iron and TIBC  . Ferritin      Derek Jack, MD [address] [phone]

## 2017-10-01 ENCOUNTER — Encounter (HOSPITAL_COMMUNITY): Payer: Self-pay

## 2017-10-01 ENCOUNTER — Ambulatory Visit (HOSPITAL_COMMUNITY): Payer: Medicare Other | Admitting: Adult Health

## 2017-10-01 ENCOUNTER — Emergency Department (HOSPITAL_COMMUNITY): Payer: Medicare Other

## 2017-10-01 ENCOUNTER — Emergency Department (HOSPITAL_COMMUNITY)
Admission: EM | Admit: 2017-10-01 | Discharge: 2017-10-01 | Disposition: A | Discharge status: Home / Self Care | Payer: Medicare Other | Attending: Emergency Medicine | Admitting: Emergency Medicine

## 2017-10-01 ENCOUNTER — Other Ambulatory Visit: Payer: Self-pay

## 2017-10-01 DIAGNOSIS — I2510 Atherosclerotic heart disease of native coronary artery without angina pectoris: Secondary | ICD-10-CM | POA: Diagnosis not present

## 2017-10-01 DIAGNOSIS — Z853 Personal history of malignant neoplasm of breast: Secondary | ICD-10-CM | POA: Diagnosis not present

## 2017-10-01 DIAGNOSIS — Z85820 Personal history of malignant melanoma of skin: Secondary | ICD-10-CM | POA: Diagnosis not present

## 2017-10-01 DIAGNOSIS — I509 Heart failure, unspecified: Secondary | ICD-10-CM | POA: Insufficient documentation

## 2017-10-01 DIAGNOSIS — Z79899 Other long term (current) drug therapy: Secondary | ICD-10-CM | POA: Diagnosis not present

## 2017-10-01 DIAGNOSIS — Z7982 Long term (current) use of aspirin: Secondary | ICD-10-CM | POA: Insufficient documentation

## 2017-10-01 DIAGNOSIS — T50901A Poisoning by unspecified drugs, medicaments and biological substances, accidental (unintentional), initial encounter: Secondary | ICD-10-CM

## 2017-10-01 DIAGNOSIS — T407X1A Poisoning by cannabis (derivatives), accidental (unintentional), initial encounter: Secondary | ICD-10-CM | POA: Insufficient documentation

## 2017-10-01 DIAGNOSIS — F17210 Nicotine dependence, cigarettes, uncomplicated: Secondary | ICD-10-CM | POA: Insufficient documentation

## 2017-10-01 DIAGNOSIS — R531 Weakness: Secondary | ICD-10-CM | POA: Diagnosis present

## 2017-10-01 DIAGNOSIS — I110 Hypertensive heart disease with heart failure: Secondary | ICD-10-CM | POA: Diagnosis not present

## 2017-10-01 LAB — AMMONIA: Ammonia: 41 umol/L — ABNORMAL HIGH (ref 9–35)

## 2017-10-01 LAB — RAPID URINE DRUG SCREEN, HOSP PERFORMED
Amphetamines: NOT DETECTED
BENZODIAZEPINES: NOT DETECTED
Cocaine: NOT DETECTED
Opiates: NOT DETECTED
Tetrahydrocannabinol: POSITIVE — AB

## 2017-10-01 LAB — COMPREHENSIVE METABOLIC PANEL
ALT: 17 U/L (ref 14–54)
AST: 35 U/L (ref 15–41)
Albumin: 3.9 g/dL (ref 3.5–5.0)
Alkaline Phosphatase: 64 U/L (ref 38–126)
Anion gap: 12 (ref 5–15)
BILIRUBIN TOTAL: 0.6 mg/dL (ref 0.3–1.2)
BUN: 20 mg/dL (ref 6–20)
CHLORIDE: 105 mmol/L (ref 101–111)
CO2: 23 mmol/L (ref 22–32)
CREATININE: 1.32 mg/dL — AB (ref 0.44–1.00)
Calcium: 9.3 mg/dL (ref 8.9–10.3)
GFR, EST AFRICAN AMERICAN: 47 mL/min — AB (ref >60)
GFR, EST NON AFRICAN AMERICAN: 40 mL/min — AB (ref >60)
Glucose, Bld: 101 mg/dL — ABNORMAL HIGH (ref 65–99)
POTASSIUM: 3.8 mmol/L (ref 3.5–5.1)
Sodium: 140 mmol/L (ref 135–145)
TOTAL PROTEIN: 7.2 g/dL (ref 6.5–8.1)

## 2017-10-01 LAB — I-STAT CHEM 8, ED
BUN: 25 mg/dL — ABNORMAL HIGH (ref 6–20)
CHLORIDE: 103 mmol/L (ref 101–111)
Calcium, Ion: 1.13 mmol/L — ABNORMAL LOW (ref 1.15–1.40)
Creatinine, Ser: 1.5 mg/dL — ABNORMAL HIGH (ref 0.44–1.00)
GLUCOSE: 105 mg/dL — AB (ref 65–99)
HCT: 34 % — ABNORMAL LOW (ref 36.0–46.0)
HEMOGLOBIN: 11.6 g/dL — AB (ref 12.0–15.0)
Potassium: 3.8 mmol/L (ref 3.5–5.1)
SODIUM: 141 mmol/L (ref 135–145)
TCO2: 26 mmol/L (ref 22–32)

## 2017-10-01 LAB — PROTIME-INR
INR: 0.99
PROTHROMBIN TIME: 13 s (ref 11.4–15.2)

## 2017-10-01 LAB — CBC
HEMATOCRIT: 34.5 % — AB (ref 36.0–46.0)
Hemoglobin: 11.2 g/dL — ABNORMAL LOW (ref 12.0–15.0)
MCH: 36.5 pg — AB (ref 26.0–34.0)
MCHC: 32.5 g/dL (ref 30.0–36.0)
MCV: 112.4 fL — ABNORMAL HIGH (ref 78.0–100.0)
Platelets: 371 10*3/uL (ref 150–400)
RBC: 3.07 MIL/uL — ABNORMAL LOW (ref 3.87–5.11)
RDW: 14.6 % (ref 11.5–15.5)
WBC: 7.4 10*3/uL (ref 4.0–10.5)

## 2017-10-01 LAB — DIFFERENTIAL
BASOS PCT: 1 %
Basophils Absolute: 0.1 10*3/uL (ref 0.0–0.1)
EOS PCT: 1 %
Eosinophils Absolute: 0.1 10*3/uL (ref 0.0–0.7)
Lymphocytes Relative: 25 %
Lymphs Abs: 1.9 10*3/uL (ref 0.7–4.0)
MONO ABS: 0.6 10*3/uL (ref 0.1–1.0)
Monocytes Relative: 8 %
NEUTROS ABS: 4.7 10*3/uL (ref 1.7–7.7)
Neutrophils Relative %: 65 %

## 2017-10-01 LAB — URINALYSIS, ROUTINE W REFLEX MICROSCOPIC
BILIRUBIN URINE: NEGATIVE
Bacteria, UA: NONE SEEN
Glucose, UA: NEGATIVE mg/dL
KETONES UR: NEGATIVE mg/dL
LEUKOCYTES UA: NEGATIVE
Nitrite: NEGATIVE
PH: 5 (ref 5.0–8.0)
PROTEIN: NEGATIVE mg/dL
Specific Gravity, Urine: 1.006 (ref 1.005–1.030)

## 2017-10-01 LAB — CBG MONITORING, ED: Glucose-Capillary: 98 mg/dL (ref 65–99)

## 2017-10-01 LAB — I-STAT ARTERIAL BLOOD GAS, ED
Acid-Base Excess: 2 mmol/L (ref 0.0–2.0)
BICARBONATE: 25.7 mmol/L (ref 20.0–28.0)
O2 Saturation: 95 %
PCO2 ART: 37.3 mmHg (ref 32.0–48.0)
PO2 ART: 72 mmHg — AB (ref 83.0–108.0)
TCO2: 27 mmol/L (ref 22–32)
pH, Arterial: 7.446 (ref 7.350–7.450)

## 2017-10-01 LAB — I-STAT TROPONIN, ED: TROPONIN I, POC: 0 ng/mL (ref 0.00–0.08)

## 2017-10-01 LAB — APTT: aPTT: 23 seconds — ABNORMAL LOW (ref 24–36)

## 2017-10-01 LAB — ETHANOL

## 2017-10-01 NOTE — Consult Note (Signed)
NEURO HOSPITALIST      Requesting Physician: Dr.     Laurel Dimmer Complaint:  Left side weakness and slurred speech  History obtained from:  Patient    HPI:                                                                                                                                         Ashley [name] is an 67 y.o. female with PMH significant for HTN, HLD,  Breast cancer, and COPD ( not on home oxygen). Presented to [facility] initially as a code stroke, but the code stroke was later canceled as symptoms were determined to most likely be metabolic.   Presenting complaints in detail: patient states that she woke up this morning in her usual state of health. About 0730 she went to eat a bowl of cereal. While she was eating the cereal she felt a pop in her mouth. She states that it tasted like oil. She spit out the cereal that was in her mouth and found a "pill capsule" Looked like a Marinol capsule. 10-15 minutes later she began to feel, light headed, and weak. She describes it as "feeling like when you are waking up from anesthesia". Her speech was slurred, but she took her BP and it was not low so he proceeded to drive herself to church thinking that the feeling would go away. When she arrived at the church where her daughter with her speech worsening 911 was called. Patient states that her mouth felt really dry during this time, but every time she tried to drink cold water it would "burn" her throat. During this entire time she reports that her heart felt like it was racing. Her daughter states that she was breathing funny and fast. She did hear wheezing. She denies any SOB, CP, HA, vision problems, n/v, or numbness and tingling. This has never happened before, and she states that these tremors are new. She does take klonopin and gabapentin every night at bedtime. It was last taken 09/30/17 at bedtime. She doses smoke, denies alcohol usage, denies drug use.   ED  course: BP on arrival to ED was 120/88. BG was 121. In the ed she had a head CT that was negative.   Date last known well: Date: 10/01/2017 Time last known well: Time: 07:30 tPA Given: No: not indicated           Modified Rankin: Rankin Score=1  NIHSS: 5 1a. 0 1b 0 1c. 0 2.0 3. 0  5a  (1) 5b. (1)  6a. 1 6b.  1  7. 0  8 0 9 0 10. 1  11. 0  Past Medical History:  Diagnosis Date  . Adenocarcinoma of left breast ([place]) 01/09/2016  . Arthritis   . Ascending aortic aneurysm ([place])   . Cancer [facility]) 1995   breast, left, mastectomy/chemo  . Chest pain    Possibly cardiac. No evidence of ischemia/injury based upon normal troponin I. Chest discomfort could be tachycardia induced supply demand mismatch.   . CHF (congestive heart failure) ([place]) 11/17/2015   after surgery   . Colon adenomas   . Coronary artery disease   . Dyspnea    with exertion  . Emphysema of lung ([place]) 11/17/2015  . Essential hypertension, benign   . GERD (gastroesophageal reflux disease)   . Hyperlipidemia   . Hypertension   . Melanoma ([place]) 01/09/2016  . Palpitations   . Pernicious anemia 03/06/2016  . Pure hypercholesterolemia   . Thrombocythemia, essential ([place]) 01/09/2016  . Thrombocytosis ([place])    Idiopathic    Past Surgical History:  Procedure Laterality Date  . ANTERIOR AND POSTERIOR REPAIR N/A 12/09/2014   Procedure: ANTERIOR (CYSTOCELE) AND POSTERIOR REPAIR (RECTOCELE);  Surgeon: Bjorn Loser, MD;  Location: [facility] [facility];  Service: Urology;  Laterality: N/A;  . ANTERIOR CERVICAL DECOMPRESSION/DISCECTOMY FUSION 4 LEVELS Right 10/03/2016   Procedure: ANTERIOR CERVICAL DECOMPRESSION FUSION, CERVICAL 4-5, CERVICAL 5-6, CERVICAL 6-7, CERVICAL 7 TO THORACIC 1 WITH INSTRUMENTATION AND ALLOGRAFT;  Surgeon: Phylliss Bob, MD;  Location: [place];  Service: Orthopedics;  Laterality: Right;  ANTERIOR CERVICAL DECOMPRESSION FUSION, CERVICAL 4-5, CERVICAL 5-6, CERVICAL 6-7, CERVICAL 7 TO THORACIC 1 WITH  INSTRUMENTATION AND ALLOGRAFT; REQUEST 4 HO  . ANTERIOR LAT LUMBAR FUSION Left 11/16/2015   Procedure: LEFT SIDED LATERAL INTERBODY FUSION, LUMBAR 2-3, LUMBAR 3-4, LUMBAR 4-5 WITH INSTRUMENTATION;  Surgeon: Phylliss Bob, MD;  Location: [place];  Service: Orthopedics;  Laterality: Left;  LEFT SIDED LATEARL INTERBODY FUSION, LUMBAR 2-3, LUMBAR 3-4, LUMBAR 4-5 WITH INSTRUMENTATION   . APPENDECTOMY    . BONE MARROW ASPIRATION  07/2012  . BONE MARROW BIOPSY  07/2012  . BREAST SURGERY    . CARDIAC CATHETERIZATION    . CARDIAC CATHETERIZATION N/A 01/20/2016   Procedure: Left Heart Cath and Coronary Angiography;  Surgeon: Burnell Blanks, MD;  Location: [place] [facility];  Service: Cardiovascular;  Laterality: N/A;  . COLONOSCOPY  11/29/2010   Procedure: COLONOSCOPY;  Surgeon: Rogene Houston, MD;  [facility];  Service: Endoscopy;  Laterality: N/A;  . COLONOSCOPY N/A 02/18/2014   Procedure: COLONOSCOPY;  Surgeon: Rogene Houston, MD;  [facility];  Service: Endoscopy;  Laterality: N/A;  1030  . COLONOSCOPY N/A 02/25/2017   Procedure: COLONOSCOPY;  Surgeon: Rogene Houston, MD;  [facility];  Service: Endoscopy;  Laterality: N/A;  10:55  . CYSTOSCOPY N/A 12/09/2014   Procedure: CYSTOSCOPY;  Surgeon: Bjorn Loser, MD;  Location: [place] [facility];  Service: Urology;  Laterality: N/A;  . ESOPHAGEAL DILATION N/A 02/25/2017   Procedure: ESOPHAGEAL DILATION;  Surgeon: Rogene Houston, MD;  [facility];  Service: Endoscopy;  Laterality: N/A;  . ESOPHAGOGASTRODUODENOSCOPY N/A 02/25/2017   Procedure: ESOPHAGOGASTRODUODENOSCOPY (EGD);  Surgeon: Rogene Houston, MD;  [facility];  Service: Endoscopy;  Laterality: N/A;  . IR GENERIC HISTORICAL  01/11/2016   IR RADIOLOGY PERIPHERAL GUIDED IV START 01/11/2016 Saverio Danker, PA-C [facility]  . IR GENERIC HISTORICAL  01/11/2016   IR US GUIDE VASC ACCESS RIGHT 01/11/2016 Saverio Danker, PA-C [facility]  .  MASTECTOMY  left  . OVARIAN CYST SURGERY     x2  . POLYPECTOMY  02/25/2017   Procedure: POLYPECTOMY;  Surgeon: Rogene Houston, MD;  [facility];  Service: Endoscopy;;  . SALPINGOOPHORECTOMY Bilateral 12/09/2014   Procedure: SALPINGO OOPHORECTOMY;  Surgeon: Servando Salina, MD;  Location: [place] [facility];  Service: Gynecology;  Laterality: Bilateral;  . TUBAL LIGATION    . VAGINAL HYSTERECTOMY N/A 12/09/2014   Procedure: HYSTERECTOMY VAGINAL;  Surgeon: Servando Salina, MD;  Location: [facility];  Service: Gynecology;  Laterality: N/A;    Family History  Problem Relation Age of Onset  . Hypertension Mother   . Heart failure Mother   . Congestive Heart Failure Mother   . COPD Mother   . Pernicious anemia Mother   . Cancer Mother        lung  . Hypertension Father   . CAD Father   . Heart attack Father   . Hypertension Sister   . Cancer Other   . Celiac disease Other       Social History:  reports that she has been smoking cigarettes.  She has a 25.00 pack-year smoking history. She has never used smokeless tobacco. She reports that she does not drink alcohol or use drugs.  Allergies:  Allergies  Allergen Reactions  . Penicillins Hives and Rash    Has patient had a PCN reaction causing immediate rash, facial/tongue/throat swelling, SOB or lightheadedness with hypotension: Yes Has patient had a PCN reaction causing severe rash involving mucus membranes or skin necrosis: Yes Has patient had a PCN reaction that required hospitalization No Has patient had a PCN reaction occurring within the last 10 years: Yes If all of the above answers are "NO", then may proceed with Cephalosporin use.   . Tape Rash    Medipore, Coban, and paper tape CAN be tolerated    Medications:                                                                                                                           No current facility-administered medications for this encounter.    Current  Outpatient Medications  Medication Sig Dispense Refill  . albuterol (PROVENTIL HFA;VENTOLIN HFA) 108 (90 Base) MCG/ACT inhaler Inhale 2 puffs into the lungs every 6 (six) hours as needed for wheezing or shortness of breath. 1 Inhaler 2  . aspirin EC 81 MG tablet Take 81 mg by mouth daily.    . Calcium Carb-Cholecalciferol (CALCIUM 600+D) 600-800 MG-UNIT TABS Take 1 tablet by mouth 2 (two) times daily.    . Cholecalciferol (VITAMIN D-3) 1000 units CAPS Take 1,000 Units daily by mouth.     . clonazePAM (KLONOPIN) 0.5 MG tablet TAKE (1) TABLET BY MOUTH AT BEDTIME. 30 tablet 3  . cyanocobalamin (,VITAMIN B-12,) 1000 MCG/ML injection INJECT 1 ML INTO THE MUSCLE ONCE MONTHLY AS DIRECTED. 1 mL 5  . furosemide (LASIX) 40 MG tablet Take 40 mg daily by mouth.     Marland Kitchen  gabapentin (NEURONTIN) 300 MG capsule Take 300 mg by mouth at bedtime.    . hydroxyurea (HYDREA) 500 MG capsule 500 mg once daily Mondays thru friday and 1000 mg once daily on Saturday and Sunday. 36 capsule 1  . ibandronate (BONIVA) 150 MG tablet Take 1 tablet by mouth every 30 (thirty) days.    Marland Kitchen ibuprofen (ADVIL,MOTRIN) 800 MG tablet Take 800 mg every 8 (eight) hours as needed by mouth for moderate pain.    Marland Kitchen ipratropium-albuterol (DUONEB) 0.5-2.5 (3) MG/3ML SOLN Take 3 mLs by nebulization every 6 (six) hours as needed (shortness of breath). 360 mL 0  . irbesartan (AVAPRO) 75 MG tablet Take 75 mg at bedtime by mouth.     . methocarbamol (ROBAXIN) 500 MG tablet Take 500 mg daily as needed by mouth for muscle spasms.     . ondansetron (ZOFRAN) 8 MG tablet Take 1 tablet (8 mg total) by mouth every 8 (eight) hours as needed for nausea or vomiting. 20 tablet 5  . pantoprazole (PROTONIX) 40 MG tablet Take 40 mg daily by mouth.    . potassium chloride SA (K-DUR,KLOR-CON) 20 MEQ tablet Take 1 tablet (20 mEq total) by mouth 3 (three) times daily. 90 tablet 5  . predniSONE (DELTASONE) 20 MG tablet     . rosuvastatin (CRESTOR) 40 MG tablet Take 40 mg at  bedtime by mouth.       ROS:                                                                                                                                       History obtained from the patient  General ROS: negative for - chills, fatigue, fever, night sweats, weight gain or weight loss Ophthalmic ROS: negative for - blurry vision, double vision, eye pain or loss of vision ENT ROS: Positive for burning with drinking water and dry mouth. negative for - epistaxis, nasal discharge, oral lesions, sore throat, tinnitus or vertigo.  Respiratory ROS: negative for - cough,  shortness of breath or wheezing Cardiovascular ROS: negative for - chest pain, dyspnea on exertion,  Gastrointestinal ROS: negative for - abdominal pain, diarrhea,  nausea/vomiting or stool incontinence Musculoskeletal ROS: Positive for : left side weakness that is improving. negative for - joint swelling or muscular weakness Neurological ROS: as noted in HPI   General Examination:  Blood pressure 138/82, pulse 68, temperature 98 F (36.7 C), temperature source Oral, resp. rate 16, height '5\' 2"'$  (1.575 m), weight 49 kg (108 lb), SpO2 98 %.  HEENT-  Normocephalic, no lesions, without obvious abnormality.  Normal external eye and conjunctiva.  Cardiovascular- S1-S2 audible, pulses palpable throughout   Lungs-no rhonchi or wheezing noted, no excessive working breathing.  Saturations within normal limits on RA. Extremities- Warm, dry and intact, asterixis noted in bilateral hands. Musculoskeletal-no joint tenderness, deformity or swelling. Leg tremors present.  Skin-warm and dry, intact  Neurological Examination Mental Status: Alert, oriented, to time/place/ situation. Good historian of events.  Speech is dysarthric without evidence of aphasia.  Able to follow 3 step commands without difficulty. Cranial Nerves: II:  Visual  fields  normal,  III,IV, VI: ptosis not present, extra-ocular motions intact bilaterally, pupils equal, round, reactive to light and accommodation V,VII: smile symmetric, facial light touch sensation normal bilaterally VIII: hearing normal bilaterally IX,X: uvula rises symmetrically XI: bilateral shoulder shrug XII: midline tongue extension Motor: Right : Upper extremity   5/5    Left:     Upper extremity   5/5  Lower extremity   5/5     Lower extremity   5/5 Tone and bulk:normal tone throughout; no atrophy noted Sensory:  light touch intact throughout, bilaterally Deep Tendon Reflexes: 2+ and symmetric throughout Plantars: Right: downgoing   Left: downgoing Cerebellar: abnormal finger-to-nose d/t tremors,  abormal heel-to-shin test d/t tremors. Gait: did not test   Lab Results:    Toxicology:  positive for cannabinol,  negative for benzodiazepines.   Basic Metabolic Panel: Recent Labs  Lab 10/01/17 1019 10/01/17 1023  NA 140 141  K 3.8 3.8  CL 105 103  CO2 23  --   GLUCOSE 101* 105*  BUN 20 25*  CREATININE 1.32* 1.50*  CALCIUM 9.3  --     CBC: Recent Labs  Lab 10/01/17 1019 10/01/17 1023  WBC 7.4  --   NEUTROABS 4.7  --   HGB 11.2* 11.6*  HCT 34.5* 34.0*  MCV 112.4*  --   PLT 371  --     Lipid Panel: No results for input(s): CHOL, TRIG, HDL, CHOLHDL, VLDL, LDLCALC in the last 168 hours.  CBG: Recent Labs  Lab 10/01/17 1010  GLUCAP 98    Imaging: Ct Head Code Stroke Wo Contrast  Result Date: 10/01/2017 CLINICAL DATA:  Code stroke.  Speech abnormality EXAM: CT HEAD WITHOUT CONTRAST TECHNIQUE: Contiguous axial images were obtained from the base of the skull through the vertex without intravenous contrast. COMPARISON:  None. FINDINGS: Brain: Ventricle size normal. Negative for acute infarct. Negative for acute hemorrhage or mass. No significant chronic ischemic changes Vascular: Negative for hyperdense vessel Skull: Negative Sinuses/Orbits: Mild  mucosal edema in the sphenoid sinus. Remaining sinuses clear. Normal orbit. Other: None ASPECTS ([place] Stroke Program Early CT Score) - Ganglionic level infarction (caudate, lentiform nuclei, internal capsule, insula, M1-M3 cortex): 7 - Supraganglionic infarction (M4-M6 cortex): 3 Total score (0-10 with 10 being normal): 10 IMPRESSION: 1. Negative CT head 2. ASPECTS is 10 3. These results were called by telephone at the time of interpretation on 10/01/2017 at 10:34 am to Dr. Lorraine Lax , who verbally acknowledged these results. Electronically Signed   By: Franchot Gallo M.D.   On: 10/01/2017 10:35      Laurey Morale, MSN, NP-C [facility] [phone]  10/01/2017, 10:28 AM   Assessment: 67 y.o. female with PMH significant for HTN, HLD, Breast cancer,  and COPD. Patient presented to [facility] as a code stroke for left side weakness and slurred speech.  CT head obtained that was negative and the code stroke was canceled. Asterixis was noted in bilateral hands. Tremors in all 4 extremities were noted in a patient with a history of COPD. Most likely cause of symptoms is metabolic.  Stroke Risk Factors - hypercoagulable state, hyperlipidemia and hypertension   Recommendations: --Abg --Ammonia level --Urine toxicology screen - positive for THC   NEUROHOSPITALIST ADDENDUM Seen and examined the patient today. I have reviewed the contents of history and physical exam as documented by PA/ARNP/Resident and agree with above documentation.  I have discussed and formulated the above plan as documented. Edits to the note have been made as needed.  Patient apparently accidentally took Marinol. On arrival no focal deficits noted, patient had asterixis. Metabolic workup negative apart from mildly elevated Ammonia. UDS + for THC.  Patient felt better after a few hours and was discharged from ED.   Karena Addison Aroor MD [facility] [phone]   If 7pm to 7am, please call on call as listed on  AMION.

## 2017-10-01 NOTE — ED Provider Notes (Signed)
[facility] EMERGENCY DEPARTMENT Provider Note   CSN: [id number] Arrival date & time: 10/01/17  1007   An emergency department physician performed an initial assessment on this suspected stroke patient at 1013.  History   Chief Complaint No chief complaint on file.   HPI Ashley [name] is a 67 y.o. female.  HPI   Patient presents for evaluation of a funny taste in her mouth, difficulty talking, weakness and tiredness, and confusion, all of which started after eating cereal today.  She states that she was eating cereal, when she felt a "pop in my mouth and a funny taste, like oil."  She then reached into her mouth and pulled out a small gelatinlike item which appeared to her to be a Marinol capsule.  She has previously used Marinol, but not recently.  She does not know how it would have gotten into her cereal.  Following onset of the symptoms within 20 or 30 minutes she decided to go to her church, where she had a plan to meeting.  She had difficulty driving, because of weakness but she was able to get to the church.  When she got to the chart she is drank some water and noticed that she still has the oily sensation and a funny taste in her mouth.  She felt she had a burning sensation in her throat when she swallowed.  The patient called family members, and EMS, to come see her at the church.  A minister arrived before the first responders and found her alert but confused.  EMS and family members arrived about the same time and the patient was having difficulty talking and was confused.  Since arriving into the emergency department, family members state that she has improved and is now near her baseline and feels ready to go home, as of 1:30 PM.  No prior similar problems.  No other recent illnesses or issues, that she knows of.  She denies having headache, blurred vision, chest pain, shortness of breath, focal weakness or paresthesia.  There are no other known modifying  factors.  Past Medical History:  Diagnosis Date  . Adenocarcinoma of left breast ([place]) 01/09/2016  . Arthritis   . Ascending aortic aneurysm ([place])   . Cancer [facility]) 1995   breast, left, mastectomy/chemo  . Chest pain    Possibly cardiac. No evidence of ischemia/injury based upon normal troponin I. Chest discomfort could be tachycardia induced supply demand mismatch.   . CHF (congestive heart failure) ([place]) 11/17/2015   after surgery   . Colon adenomas   . Coronary artery disease   . Dyspnea    with exertion  . Emphysema of lung ([place]) 11/17/2015  . Essential hypertension, benign   . GERD (gastroesophageal reflux disease)   . Hyperlipidemia   . Hypertension   . Melanoma ([place]) 01/09/2016  . Palpitations   . Pernicious anemia 03/06/2016  . Pure hypercholesterolemia   . 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 Thrombocytosis ([place])    Idiopathic    Patient Active Problem List   Diagnosis Date Noted  . Abnormal CT of the chest 02/18/2017  . Esophageal dysphagia 02/18/2017  . Pernicious anemia 03/06/2016  . Dyspnea   . Non-ischemic cardiomyopathy ([place])   . Adenocarcinoma of left breast ([place]) 01/09/2016  . Melanoma ([place]) 01/09/2016  . 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 Acute respiratory failure ([place]) 11/17/2015  . Emphysema of lung ([place]) 11/17/2015  . CHF exacerbation ([place]) 11/17/2015  . Radiculopathy 11/16/2015  .  Acute right-sided low back pain with sciatica 07/19/2015  . Multiple pulmonary nodules 01/18/2015  . Status post vaginal hysterectomy 12/09/2014  . BRCA negative 07/21/2014    Past Surgical History:  Procedure Laterality Date  . ANTERIOR AND POSTERIOR REPAIR N/A 12/09/2014   Procedure: ANTERIOR (CYSTOCELE) AND POSTERIOR REPAIR (RECTOCELE);  Surgeon: Bjorn Loser, MD;  Location: [facility];  Service: Urology;  Laterality: N/A;  . ANTERIOR CERVICAL DECOMPRESSION/DISCECTOMY FUSION 4 LEVELS Right 10/03/2016   Procedure: ANTERIOR CERVICAL DECOMPRESSION  FUSION, CERVICAL 4-5, CERVICAL 5-6, CERVICAL 6-7, CERVICAL 7 TO THORACIC 1 WITH INSTRUMENTATION AND ALLOGRAFT;  Surgeon: Phylliss Bob, MD;  Location: [facility];  Service: Orthopedics;  Laterality: Right;  ANTERIOR CERVICAL DECOMPRESSION FUSION, CERVICAL 4-5, CERVICAL 5-6, CERVICAL 6-7, CERVICAL 7 TO THORACIC 1 WITH INSTRUMENTATION AND ALLOGRAFT; REQUEST 4 HO  . ANTERIOR LAT LUMBAR FUSION Left 11/16/2015   Procedure: LEFT SIDED LATERAL INTERBODY FUSION, LUMBAR 2-3, LUMBAR 3-4, LUMBAR 4-5 WITH INSTRUMENTATION;  Surgeon: Phylliss Bob, MD;  Location: [facility];  Service: Orthopedics;  Laterality: Left;  LEFT SIDED LATEARL INTERBODY FUSION, LUMBAR 2-3, LUMBAR 3-4, LUMBAR 4-5 WITH INSTRUMENTATION   . APPENDECTOMY    . BONE MARROW ASPIRATION  07/2012  . BONE MARROW BIOPSY  07/2012  . BREAST SURGERY    . CARDIAC CATHETERIZATION    . CARDIAC CATHETERIZATION N/A 01/20/2016   Procedure: Left Heart Cath and Coronary Angiography;  Surgeon: Burnell Blanks, MD;  Location: [facility];  Service: Cardiovascular;  Laterality: N/A;  . COLONOSCOPY  11/29/2010   Procedure: COLONOSCOPY;  Surgeon: Rogene Houston, MD;  [facility];  Service: Endoscopy;  Laterality: N/A;  . COLONOSCOPY N/A 02/18/2014   Procedure: COLONOSCOPY;  Surgeon: Rogene Houston, MD;  [facility];  Service: Endoscopy;  Laterality: N/A;  1030  . COLONOSCOPY N/A 02/25/2017   Procedure: COLONOSCOPY;  Surgeon: Rogene Houston, MD;  [facility];  Service: Endoscopy;  Laterality: N/A;  10:55  . CYSTOSCOPY N/A 12/09/2014   Procedure: CYSTOSCOPY;  Surgeon: Bjorn Loser, MD;  Location: [facility];  Service: Urology;  Laterality: N/A;  . ESOPHAGEAL DILATION N/A 02/25/2017   Procedure: ESOPHAGEAL DILATION;  Surgeon: Rogene Houston, MD;  [facility];  Service: Endoscopy;  Laterality: N/A;  . ESOPHAGOGASTRODUODENOSCOPY N/A 02/25/2017   Procedure: ESOPHAGOGASTRODUODENOSCOPY (EGD);  Surgeon: Rogene Houston,  MD;  [facility];  Service: Endoscopy;  Laterality: N/A;  . IR GENERIC HISTORICAL  01/11/2016   IR RADIOLOGY PERIPHERAL GUIDED IV START 01/11/2016 Saverio Danker, PA-C [facility]  . IR GENERIC HISTORICAL  01/11/2016   IR US GUIDE VASC ACCESS RIGHT 01/11/2016 Saverio Danker, PA-C [facility]  . MASTECTOMY     left  . OVARIAN CYST SURGERY     x2  . POLYPECTOMY  02/25/2017   Procedure: POLYPECTOMY;  Surgeon: Rogene Houston, MD;  [facility];  Service: Endoscopy;;  . SALPINGOOPHORECTOMY Bilateral 12/09/2014   Procedure: SALPINGO OOPHORECTOMY;  Surgeon: Servando Salina, MD;  Location: [facility];  Service: Gynecology;  Laterality: Bilateral;  . TUBAL LIGATION    . VAGINAL HYSTERECTOMY N/A 12/09/2014   Procedure: HYSTERECTOMY VAGINAL;  Surgeon: Servando Salina, MD;  Location: [facility];  Service: Gynecology;  Laterality: N/A;     OB History   None      Home Medications    Prior to Admission medications   Medication Sig Start Date End Date Taking? Authorizing Provider  albuterol (PROVENTIL HFA;VENTOLIN HFA) 108 (90  Base) MCG/ACT inhaler Inhale 2 puffs into the lungs every 6 (six) hours as needed for wheezing or shortness of breath. 05/01/17  Yes Mannam, Praveen, MD  aspirin EC 81 MG tablet Take 81 mg by mouth daily.   Yes [provider]  Calcium Carb-Cholecalciferol (CALCIUM 600+D) 600-800 MG-UNIT TABS Take 1 tablet by mouth 2 (two) times daily.   Yes [provider]  Cholecalciferol (VITAMIN D-3) 1000 units CAPS Take 1,000 Units daily by mouth.    Yes [provider]  clonazePAM (KLONOPIN) 0.5 MG tablet TAKE (1) TABLET BY MOUTH AT BEDTIME. 08/01/17  Yes Derek Jack, MD  cyanocobalamin (,VITAMIN B-12,) 1000 MCG/ML injection INJECT 1 ML INTO THE MUSCLE ONCE MONTHLY AS DIRECTED. 09/26/16  Yes Kefalas, Manon Hilding, PA-C  furosemide (LASIX) 40 MG tablet Take 40 mg daily by mouth.  01/16/17  Yes [provider]  gabapentin  (NEURONTIN) 300 MG capsule Take 300 mg by mouth at bedtime.   Yes [provider]  hydroxyurea (HYDREA) 500 MG capsule 500 mg once daily Mondays thru friday and 1000 mg once daily on Saturday and Sunday. 06/13/17  Yes Perlov, Marinell Blight, MD  ibandronate (BONIVA) 150 MG tablet Take 1 tablet by mouth every 30 (thirty) days. 07/14/12  Yes [provider]  ibuprofen (ADVIL,MOTRIN) 800 MG tablet Take 800 mg every 8 (eight) hours as needed by mouth for moderate pain.   Yes [provider]  ipratropium-albuterol (DUONEB) 0.5-2.5 (3) MG/3ML SOLN Take 3 mLs by nebulization every 6 (six) hours as needed (shortness of breath). 11/21/15  Yes Arrien, Jimmy Picket, MD  irbesartan (AVAPRO) 75 MG tablet Take 75 mg at bedtime by mouth.  02/12/17  Yes [provider]  methocarbamol (ROBAXIN) 500 MG tablet Take 500 mg daily as needed by mouth for muscle spasms.    Yes [provider]  ondansetron (ZOFRAN) 8 MG tablet Take 1 tablet (8 mg total) by mouth every 8 (eight) hours as needed for nausea or vomiting. 08/01/17  Yes Derek Jack, MD  pantoprazole (PROTONIX) 40 MG tablet Take 40 mg daily by mouth.   Yes [provider]  potassium chloride SA (K-DUR,KLOR-CON) 20 MEQ tablet Take 1 tablet (20 mEq total) by mouth 3 (three) times daily. 02/15/17  Yes Kefalas, Manon Hilding, PA-C  rosuvastatin (CRESTOR) 40 MG tablet Take 40 mg at bedtime by mouth.    Yes [provider]    Family History Family History  Problem Relation Age of Onset  . Hypertension Mother   . Heart failure Mother   . Congestive Heart Failure Mother   . COPD Mother   . Pernicious anemia Mother   . Cancer Mother        lung  . Hypertension Father   . CAD Father   . Heart attack Father   . Hypertension Sister   . Cancer Other   . Celiac disease Other     Social History Social History   Tobacco Use  . Smoking status: Current Every Day Smoker    Packs/day: 0.50    Years: 50.00     Pack years: 25.00    Types: Cigarettes  . Smokeless tobacco: Never Used  Substance Use Topics  . Alcohol use: No  . Drug use: No     Allergies   Penicillins and Tape   Review of Systems Review of Systems  All other systems reviewed and are negative.    Physical Exam Updated Vital Signs BP (!) 108/55   Pulse  64   Temp 98.2 F (36.8 C)   Resp 16   Ht '5\' 2"'  (1.575 m)   Wt 49 kg (108 lb)   SpO2 98%   BMI 19.75 kg/m   Physical Exam  Constitutional: She is oriented to person, place, and time. She appears well-developed and well-nourished. No distress.  HENT:  Head: Normocephalic and atraumatic.  Eyes: Pupils are equal, round, and reactive to light. Conjunctivae and EOM are normal.  Neck: Normal range of motion and phonation normal. Neck supple.  Cardiovascular: Normal rate and regular rhythm.  Pulmonary/Chest: Effort normal and breath sounds normal. She exhibits no tenderness.  Abdominal: Soft. She exhibits no distension. There is no tenderness. There is no guarding.  Musculoskeletal: Normal range of motion.  Neurological: She is alert and oriented to person, place, and time. She exhibits normal muscle tone.  No dysarthria, aphasia or nystagmus.  Normal finger-to-nose and heel shin bilaterally.  No ataxia.  Skin: Skin is warm and dry.  Psychiatric: She has a normal mood and affect. Her behavior is normal. Judgment and thought content normal.  Nursing note and vitals reviewed.    ED Treatments / Results  Labs (all labs ordered are listed, but only abnormal results are displayed) Labs Reviewed  APTT - Abnormal; Notable for the following components:      Result Value   aPTT 23 (*)    All other components within normal limits  CBC - Abnormal; Notable for the following components:   RBC 3.07 (*)    Hemoglobin 11.2 (*)    HCT 34.5 (*)    MCV 112.4 (*)    MCH 36.5 (*)    All other components within normal limits  COMPREHENSIVE METABOLIC PANEL - Abnormal;  Notable for the following components:   Glucose, Bld 101 (*)    Creatinine, Ser 1.32 (*)    GFR calc non Af Amer 40 (*)    GFR calc Af Amer 47 (*)    All other components within normal limits  URINALYSIS, ROUTINE W REFLEX MICROSCOPIC - Abnormal; Notable for the following components:   Color, Urine STRAW (*)    Hgb urine dipstick SMALL (*)    All other components within normal limits  I-STAT CHEM 8, ED - Abnormal; Notable for the following components:   BUN 25 (*)    Creatinine, Ser 1.50 (*)    Glucose, Bld 105 (*)    Calcium, Ion 1.13 (*)    Hemoglobin 11.6 (*)    HCT 34.0 (*)    All other components within normal limits  ETHANOL  PROTIME-INR  DIFFERENTIAL  RAPID URINE DRUG SCREEN, HOSP PERFORMED  I-STAT TROPONIN, ED  CBG MONITORING, ED    EKG None  Radiology Ct Head Code Stroke Wo Contrast  Result Date: 10/01/2017 CLINICAL DATA:  Code stroke.  Speech abnormality EXAM: CT HEAD WITHOUT CONTRAST TECHNIQUE: Contiguous axial images were obtained from the base of the skull through the vertex without intravenous contrast. COMPARISON:  None. FINDINGS: Brain: Ventricle size normal. Negative for acute infarct. Negative for acute hemorrhage or mass. No significant chronic ischemic changes Vascular: Negative for hyperdense vessel Skull: Negative Sinuses/Orbits: Mild mucosal edema in the sphenoid sinus. Remaining sinuses clear. Normal orbit. Other: None ASPECTS ([place] Stroke Program Early CT Score) - Ganglionic level infarction (caudate, lentiform nuclei, internal capsule, insula, M1-M3 cortex): 7 - Supraganglionic infarction (M4-M6 cortex): 3 Total score (0-10 with 10 being normal): 10 IMPRESSION: 1. Negative CT head 2. ASPECTS is 10 3. These results  were called by telephone at the time of interpretation on 10/01/2017 at 10:34 am to Dr. Lorraine Lax , who verbally acknowledged these results. Electronically Signed   By: Franchot Gallo M.D.   On: 10/01/2017 10:35    Procedures Procedures (including  critical care time)  Medications Ordered in ED Medications - No data to display   Initial Impression / Assessment and Plan / ED Course  I have reviewed the triage vital signs and the nursing notes.  Pertinent labs & imaging results that were available during my care of the patient were reviewed by me and considered in my medical decision making (see chart for details).      Patient Vitals for the past 24 hrs:  BP Temp Temp src Pulse Resp SpO2 Height Weight  10/01/17 1230 (!) 108/55 - - - 16 - - -  10/01/17 1215 (!) 110/56 - - 64 16 98 % - -  10/01/17 1145 108/69 - - - 16 - - -  10/01/17 1116 - 98.2 F (36.8 C) - - - - - -  10/01/17 1115 115/79 - - (!) 30 16 99 % - -  10/01/17 1100 119/85 - - 72 18 99 % - -  10/01/17 1045 136/76 - - 65 17 99 % '5\' 2"'  (1.575 m) 49 kg (108 lb)  10/01/17 1033 138/82 98 F (36.7 C) Oral 68 16 98 % - -    3:16 PM Reevaluation with update and discussion. After initial assessment and treatment, an updated evaluation reveals she continues to be alert and cooperative.  No dysarthria or aphasia at this time.  Patient and family updated on findings and plan.Daleen Bo   Medical Decision Making: Specific symptoms post accidental ingestion of tablet, likely Marinol.  Doubt TIA, CVA, metabolic instability or serious bacterial infection.  No evidence for encephalopathy, including hepatic.  CRITICAL CARE-no Performed by: Daleen Bo  Nursing Notes Reviewed/ Care Coordinated Applicable Imaging Reviewed Interpretation of Laboratory Data incorporated into ED treatment  The patient appears reasonably screened and/or stabilized for discharge and I doubt any other medical condition or other [facility] requiring further screening, evaluation, or treatment in the ED at this time prior to discharge.  Plan: Home Medications-continue current medications; Home Treatments-rest, fluids; return here if the recommended treatment, does not improve the symptoms; Recommended  follow up-PCP follow-up 1 week and as needed.    Final Clinical Impressions(s) / ED Diagnoses   Final diagnoses:  Accidental drug ingestion, initial encounter    ED Discharge Orders    None       Daleen Bo, MD 10/02/17 1525

## 2017-10-01 NOTE — ED Triage Notes (Addendum)
Pt states that she was eating cereal around 0730, bit down and felt a pop; pt had a "funny taste in mouth and an oily feeling" and states "I feel loopy, like i'm waking up from surgery"; pt's daughter states that they found a broken capsul in the cereal; pt also endorses "waves of chest fluttering " and burning in throat"

## 2017-10-01 NOTE — Code Documentation (Signed)
67 yo female coming from home with complaints of sudden onset of slurred speech and left arm weakness that started at 0730. Pt drove to her daughter's church and the daughter called 911. [facility] responded and activated a Code Stroke. Stroke team met patient upon arrival. Noted to have generalized tremors that are not from baseline. Initial NIHSS 5 due to tremors creating a drift in all four limbs and dysarthria noted. Pt is alert and oriented x4. CT completed. Code Stroke cancelled due to low suspect for stroke. Hx of CHF, HTN, and breast cancer. Handoff give to [facility], Therapist, sports.

## 2017-10-01 NOTE — ED Notes (Signed)
Pt CBG 98.

## 2017-10-01 NOTE — Discharge Instructions (Addendum)
Make sure you are getting plenty of rest, drinking a lot of fluids and eating a regular diet.  Follow-up with your primary care doctor for checkup within 1 week.

## 2017-10-30 ENCOUNTER — Other Ambulatory Visit (HOSPITAL_COMMUNITY): Payer: Self-pay | Admitting: *Deleted

## 2017-10-30 DIAGNOSIS — D473 Essential (hemorrhagic) thrombocythemia: Secondary | ICD-10-CM

## 2017-10-30 MED ORDER — HYDROXYUREA 500 MG PO CAPS: ORAL_CAPSULE | ORAL | 1 refills | Status: DC

## 2017-11-14 ENCOUNTER — Other Ambulatory Visit (HOSPITAL_COMMUNITY): Payer: Self-pay | Admitting: Hematology

## 2017-11-14 DIAGNOSIS — C50912 Malignant neoplasm of unspecified site of left female breast: Secondary | ICD-10-CM

## 2017-12-25 ENCOUNTER — Inpatient Hospital Stay (HOSPITAL_COMMUNITY): Payer: Medicare Other | Attending: Hematology

## 2017-12-25 DIAGNOSIS — I7300 Raynaud's syndrome without gangrene: Secondary | ICD-10-CM | POA: Insufficient documentation

## 2017-12-25 DIAGNOSIS — Z85820 Personal history of malignant melanoma of skin: Secondary | ICD-10-CM | POA: Diagnosis not present

## 2017-12-25 DIAGNOSIS — Z9012 Acquired absence of left breast and nipple: Secondary | ICD-10-CM | POA: Insufficient documentation

## 2017-12-25 DIAGNOSIS — Z809 Family history of malignant neoplasm, unspecified: Secondary | ICD-10-CM | POA: Insufficient documentation

## 2017-12-25 DIAGNOSIS — Z853 Personal history of malignant neoplasm of breast: Secondary | ICD-10-CM | POA: Diagnosis not present

## 2017-12-25 DIAGNOSIS — Z9221 Personal history of antineoplastic chemotherapy: Secondary | ICD-10-CM | POA: Insufficient documentation

## 2017-12-25 DIAGNOSIS — Z801 Family history of malignant neoplasm of trachea, bronchus and lung: Secondary | ICD-10-CM | POA: Diagnosis not present

## 2017-12-25 DIAGNOSIS — Z79899 Other long term (current) drug therapy: Secondary | ICD-10-CM | POA: Insufficient documentation

## 2017-12-25 DIAGNOSIS — F17210 Nicotine dependence, cigarettes, uncomplicated: Secondary | ICD-10-CM | POA: Insufficient documentation

## 2017-12-25 DIAGNOSIS — D473 Essential (hemorrhagic) thrombocythemia: Secondary | ICD-10-CM

## 2017-12-25 LAB — CBC WITH DIFFERENTIAL/PLATELET
BASOS ABS: 0 10*3/uL (ref 0.0–0.1)
BASOS PCT: 1 %
EOS ABS: 0.3 10*3/uL (ref 0.0–0.7)
Eosinophils Relative: 3 %
HEMATOCRIT: 36.2 % (ref 36.0–46.0)
HEMOGLOBIN: 11.8 g/dL — AB (ref 12.0–15.0)
Lymphocytes Relative: 20 %
Lymphs Abs: 1.6 10*3/uL (ref 0.7–4.0)
MCH: 33.9 pg (ref 26.0–34.0)
MCHC: 32.6 g/dL (ref 30.0–36.0)
MCV: 104 fL — ABNORMAL HIGH (ref 78.0–100.0)
MONOS PCT: 9 %
Monocytes Absolute: 0.7 10*3/uL (ref 0.1–1.0)
NEUTROS PCT: 67 %
Neutro Abs: 5.3 10*3/uL (ref 1.7–7.7)
Platelets: 518 10*3/uL — ABNORMAL HIGH (ref 150–400)
RBC: 3.48 MIL/uL — AB (ref 3.87–5.11)
RDW: 14 % (ref 11.5–15.5)
WBC: 7.9 10*3/uL (ref 4.0–10.5)

## 2017-12-25 LAB — COMPREHENSIVE METABOLIC PANEL
ALT: 14 U/L (ref 0–44)
AST: 26 U/L (ref 15–41)
Albumin: 3.8 g/dL (ref 3.5–5.0)
Alkaline Phosphatase: 66 U/L (ref 38–126)
Anion gap: 8 (ref 5–15)
BUN: 14 mg/dL (ref 8–23)
CO2: 26 mmol/L (ref 22–32)
Calcium: 8.8 mg/dL — ABNORMAL LOW (ref 8.9–10.3)
Chloride: 102 mmol/L (ref 98–111)
Creatinine, Ser: 1.09 mg/dL — ABNORMAL HIGH (ref 0.44–1.00)
GFR, EST AFRICAN AMERICAN: 59 mL/min — AB (ref >60)
GFR, EST NON AFRICAN AMERICAN: 51 mL/min — AB (ref >60)
GLUCOSE: 127 mg/dL — AB (ref 70–99)
POTASSIUM: 3.3 mmol/L — AB (ref 3.5–5.1)
Sodium: 136 mmol/L (ref 135–145)
Total Bilirubin: 0.3 mg/dL (ref 0.3–1.2)
Total Protein: 7.3 g/dL (ref 6.5–8.1)

## 2017-12-25 LAB — FOLATE: Folate: 11.7 ng/mL (ref >5.9)

## 2017-12-25 LAB — IRON AND TIBC
IRON: 91 ug/dL (ref 28–170)
SATURATION RATIOS: 26 % (ref 10.4–31.8)
TIBC: 345 ug/dL (ref 250–450)
UIBC: 254 ug/dL

## 2017-12-25 LAB — FERRITIN: Ferritin: 42 ng/mL (ref 11–307)

## 2017-12-25 LAB — VITAMIN B12: Vitamin B-12: 732 pg/mL (ref 180–914)

## 2017-12-27 ENCOUNTER — Ambulatory Visit (HOSPITAL_COMMUNITY): Payer: Medicare Other | Admitting: Internal Medicine

## 2017-12-30 ENCOUNTER — Other Ambulatory Visit (HOSPITAL_COMMUNITY): Payer: Self-pay | Admitting: *Deleted

## 2017-12-30 DIAGNOSIS — C50912 Malignant neoplasm of unspecified site of left female breast: Secondary | ICD-10-CM

## 2017-12-30 MED ORDER — CLONAZEPAM 0.5 MG PO TABS: ORAL_TABLET | ORAL | 3 refills | Status: DC

## 2018-01-08 ENCOUNTER — Encounter (HOSPITAL_COMMUNITY): Payer: Self-pay | Admitting: Hematology

## 2018-01-08 ENCOUNTER — Inpatient Hospital Stay (HOSPITAL_BASED_OUTPATIENT_CLINIC_OR_DEPARTMENT_OTHER): Payer: Medicare Other | Admitting: Hematology

## 2018-01-08 ENCOUNTER — Other Ambulatory Visit: Payer: Self-pay

## 2018-01-08 VITALS — BP 115/65 | HR 57 | Resp 16 | Wt 107.5 lb

## 2018-01-08 DIAGNOSIS — Z809 Family history of malignant neoplasm, unspecified: Secondary | ICD-10-CM

## 2018-01-08 DIAGNOSIS — Z9012 Acquired absence of left breast and nipple: Secondary | ICD-10-CM

## 2018-01-08 DIAGNOSIS — Z801 Family history of malignant neoplasm of trachea, bronchus and lung: Secondary | ICD-10-CM

## 2018-01-08 DIAGNOSIS — D473 Essential (hemorrhagic) thrombocythemia: Secondary | ICD-10-CM | POA: Diagnosis not present

## 2018-01-08 DIAGNOSIS — I7300 Raynaud's syndrome without gangrene: Secondary | ICD-10-CM

## 2018-01-08 DIAGNOSIS — Z79899 Other long term (current) drug therapy: Secondary | ICD-10-CM | POA: Diagnosis not present

## 2018-01-08 DIAGNOSIS — C50912 Malignant neoplasm of unspecified site of left female breast: Secondary | ICD-10-CM

## 2018-01-08 DIAGNOSIS — F17210 Nicotine dependence, cigarettes, uncomplicated: Secondary | ICD-10-CM

## 2018-01-08 DIAGNOSIS — Z9221 Personal history of antineoplastic chemotherapy: Secondary | ICD-10-CM

## 2018-01-08 DIAGNOSIS — Z853 Personal history of malignant neoplasm of breast: Secondary | ICD-10-CM

## 2018-01-08 DIAGNOSIS — Z85820 Personal history of malignant melanoma of skin: Secondary | ICD-10-CM

## 2018-01-08 NOTE — Progress Notes (Signed)
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 The 3 liver lesions are not hypermetabolic. The radiologist suspects they may be subtly present on prior CT chest from 10/2013, to further reassuring that these are likely benign lesions.     Melanoma ([place])   07/09/2013 Initial Biopsy    Initial biopsy L upper thigh/buttocks melanoma    07/20/2013 Surgery    Excision L lateral buttock/upper thigh melanoma, clear margins    07/20/2013 Pathology Results    Breslow depth 1.02 mm, pT2a        INTERVAL HISTORY:  Ashley [name] 68 y.o. female returns for routine follow-up for thrombocytopenia. Patient is here today with her daughter. She has complaints of bilateral lower extremity edema. She also reports fatigue throughout the day. Her appetite is at 75% and she is maintaining her weight at this time. Her energy level is at 50%. She states she has not missed any doses of her Hydrea. She denies any nausea vomiting, or diarrhea. Denies any new pains. Denies any bleeding or easy bruising.      REVIEW OF SYSTEMS:  Review of Systems  Constitutional: Positive for fatigue.  HENT:   Positive for trouble swallowing.   Cardiovascular:  Positive for leg swelling.  All other systems reviewed and are negative.    PAST MEDICAL/SURGICAL HISTORY:  Past Medical History:  Diagnosis Date  . Adenocarcinoma of left breast ([place]) 01/09/2016  . Arthritis   . Ascending aortic aneurysm ([place])   . Cancer [facility]) 1995   breast, left, mastectomy/chemo  . Chest pain    Possibly cardiac. No evidence of ischemia/injury based upon normal troponin I. Chest discomfort could be tachycardia induced supply demand mismatch.   . CHF (congestive heart failure) ([place]) 11/17/2015   after surgery   . Colon adenomas   . Coronary artery disease   . Dyspnea    with exertion  . Emphysema of lung ([place]) 11/17/2015  . Essential hypertension, benign   . GERD (gastroesophageal reflux disease)   . Hyperlipidemia   . Hypertension   . Melanoma ([place]) 01/09/2016  . Palpitations   . Pernicious anemia 03/06/2016  . Pure hypercholesterolemia   . 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 Thrombocytosis ([place])    Idiopathic   Past Surgical History:  Procedure Laterality Date  . ANTERIOR AND POSTERIOR REPAIR N/A 12/09/2014   Procedure: ANTERIOR (CYSTOCELE) AND POSTERIOR REPAIR (RECTOCELE);  Surgeon: Bjorn Loser, MD;  Location: [facility];  Service: Urology;  Laterality: N/A;  . ANTERIOR CERVICAL DECOMPRESSION/DISCECTOMY FUSION 4 LEVELS Right 10/03/2016   Procedure: ANTERIOR CERVICAL DECOMPRESSION FUSION, CERVICAL 4-5, CERVICAL 5-6, CERVICAL 6-7,  CERVICAL 7 TO THORACIC 1 WITH INSTRUMENTATION AND ALLOGRAFT;  Surgeon: Phylliss Bob, MD;  Location: [facility];  Service: Orthopedics;  Laterality: Right;  ANTERIOR CERVICAL DECOMPRESSION FUSION, CERVICAL 4-5, CERVICAL 5-6, CERVICAL 6-7, CERVICAL 7 TO THORACIC 1 WITH INSTRUMENTATION AND ALLOGRAFT; REQUEST 4 HO  . ANTERIOR LAT LUMBAR FUSION Left 11/16/2015   Procedure: LEFT SIDED LATERAL INTERBODY FUSION, LUMBAR 2-3, LUMBAR 3-4, LUMBAR 4-5 WITH INSTRUMENTATION;  Surgeon: Phylliss Bob, MD;  Location: [facility];  Service: Orthopedics;   Laterality: Left;  LEFT SIDED LATEARL INTERBODY FUSION, LUMBAR 2-3, LUMBAR 3-4, LUMBAR 4-5 WITH INSTRUMENTATION   . APPENDECTOMY    . BONE MARROW ASPIRATION  07/2012  . BONE MARROW BIOPSY  07/2012  . BREAST SURGERY    . CARDIAC CATHETERIZATION    . CARDIAC CATHETERIZATION N/A 01/20/2016   Procedure: Left Heart Cath and Coronary Angiography;  Surgeon: Burnell Blanks, MD;  Location: [facility];  Service: Cardiovascular;  Laterality: N/A;  . COLONOSCOPY  11/29/2010   Procedure: COLONOSCOPY;  Surgeon: Rogene Houston, MD;  [facility];  Service: Endoscopy;  Laterality: N/A;  . COLONOSCOPY N/A 02/18/2014   Procedure: COLONOSCOPY;  Surgeon: Rogene Houston, MD;  [facility];  Service: Endoscopy;  Laterality: N/A;  1030  . COLONOSCOPY N/A 02/25/2017   Procedure: COLONOSCOPY;  Surgeon: Rogene Houston, MD;  [facility];  Service: Endoscopy;  Laterality: N/A;  10:55  . CYSTOSCOPY N/A 12/09/2014   Procedure: CYSTOSCOPY;  Surgeon: Bjorn Loser, MD;  Location: [facility];  Service: Urology;  Laterality: N/A;  . ESOPHAGEAL DILATION N/A 02/25/2017   Procedure: ESOPHAGEAL DILATION;  Surgeon: Rogene Houston, MD;  [facility];  Service: Endoscopy;  Laterality: N/A;  . ESOPHAGOGASTRODUODENOSCOPY N/A 02/25/2017   Procedure: ESOPHAGOGASTRODUODENOSCOPY (EGD);  Surgeon: Rogene Houston, MD;  [facility];  Service: Endoscopy;  Laterality: N/A;  . IR GENERIC HISTORICAL  01/11/2016   IR RADIOLOGY PERIPHERAL GUIDED IV START 01/11/2016 Saverio Danker, PA-C [facility]  . IR GENERIC HISTORICAL  01/11/2016   IR US GUIDE VASC ACCESS RIGHT 01/11/2016 Saverio Danker, PA-C [facility]  . MASTECTOMY     left  . OVARIAN CYST SURGERY     x2  . POLYPECTOMY  02/25/2017   Procedure: POLYPECTOMY;  Surgeon: Rogene Houston, MD;  [facility];  Service: Endoscopy;;  . SALPINGOOPHORECTOMY Bilateral 12/09/2014   Procedure: SALPINGO OOPHORECTOMY;   Surgeon: Servando Salina, MD;  Location: [facility];  Service: Gynecology;  Laterality: Bilateral;  . TUBAL LIGATION    . VAGINAL HYSTERECTOMY N/A 12/09/2014   Procedure: HYSTERECTOMY VAGINAL;  Surgeon: Servando Salina, MD;  Location: [facility];  Service: Gynecology;  Laterality: N/A;     SOCIAL HISTORY:  Social History   Socioeconomic History  . Marital status: Divorced    Spouse name: Not on file  . Number of children: 2  . Years of education: Not on file  . Highest education level: Not on file  Occupational History  . Not on file  Social Needs  . Financial resource strain: Not on file  . Food insecurity:    Worry: Not on file    Inability: Not on file  . Transportation needs:    Medical: Not on file    Non-medical: Not on file  Tobacco Use  . Smoking status: Current Every Day Smoker    Packs/day: 0.50    Years: 50.00    Pack years: 25.00  Types: Cigarettes  . Smokeless tobacco: Never Used  Substance and Sexual Activity  . Alcohol use: No  . Drug use: No  . Sexual activity: Yes    Birth control/protection: Post-menopausal  Lifestyle  . Physical activity:    Days per week: Not on file    Minutes per session: Not on file  . Stress: Not on file  Relationships  . Social connections:    Talks on phone: Not on file    Gets together: Not on file    Attends religious service: Not on file    Active member of club or organization: Not on file    Attends meetings of clubs or organizations: Not on file    Relationship status: Not on file  . Intimate partner violence:    Fear of current or ex partner: Not on file    Emotionally abused: Not on file    Physically abused: Not on file    Forced sexual activity: Not on file  Other Topics Concern  . Not on file  Social History Narrative  . Not on file    FAMILY HISTORY:  Family History  Problem Relation Age of Onset  . Hypertension Mother   . Heart failure Mother   . Congestive Heart Failure Mother   . COPD Mother     . Pernicious anemia Mother   . Cancer Mother        lung  . Hypertension Father   . CAD Father   . Heart attack Father   . Hypertension Sister   . Cancer Other   . Celiac disease Other     CURRENT MEDICATIONS:  Outpatient Encounter Medications as of 01/08/2018  Medication Sig  . albuterol (PROVENTIL HFA;VENTOLIN HFA) 108 (90 Base) MCG/ACT inhaler Inhale 2 puffs into the lungs every 6 (six) hours as needed for wheezing or shortness of breath.  Marland Kitchen aspirin EC 81 MG tablet Take 81 mg by mouth daily.  . Calcium Carb-Cholecalciferol (CALCIUM 600+D) 600-800 MG-UNIT TABS Take 1 tablet by mouth 2 (two) times daily.  . Cholecalciferol (VITAMIN D-3) 1000 units CAPS Take 1,000 Units daily by mouth.   . clonazePAM (KLONOPIN) 0.5 MG tablet TAKE (1) TABLET BY MOUTH AT BEDTIME.  . cyanocobalamin (,VITAMIN B-12,) 1000 MCG/ML injection INJECT 1 ML INTO THE MUSCLE ONCE MONTHLY AS DIRECTED.  . furosemide (LASIX) 40 MG tablet Take 40 mg daily by mouth.   . gabapentin (NEURONTIN) 300 MG capsule Take 300 mg by mouth at bedtime.  . hydroxyurea (HYDREA) 500 MG capsule 500 mg once daily Mondays thru friday and 1000 mg once daily on Saturday and Sunday.  . ibandronate (BONIVA) 150 MG tablet Take 1 tablet by mouth every 30 (thirty) days.  Marland Kitchen ibuprofen (ADVIL,MOTRIN) 800 MG tablet Take 800 mg every 8 (eight) hours as needed by mouth for moderate pain.  Marland Kitchen ipratropium-albuterol (DUONEB) 0.5-2.5 (3) MG/3ML SOLN Take 3 mLs by nebulization every 6 (six) hours as needed (shortness of breath).  . irbesartan (AVAPRO) 75 MG tablet Take 75 mg at bedtime by mouth.   . methocarbamol (ROBAXIN) 500 MG tablet Take 500 mg daily as needed by mouth for muscle spasms.   . ondansetron (ZOFRAN) 8 MG tablet Take 1 tablet (8 mg total) by mouth every 8 (eight) hours as needed for nausea or vomiting.  . pantoprazole (PROTONIX) 40 MG tablet Take 40 mg daily by mouth.  . potassium chloride SA (K-DUR,KLOR-CON) 20 MEQ tablet Take 1 tablet (20  mEq total) by mouth 3 (  three) times daily.  . rosuvastatin (CRESTOR) 40 MG tablet Take 40 mg at bedtime by mouth.    No facility-administered encounter medications on file as of 01/08/2018.     ALLERGIES:  Allergies  Allergen Reactions  . Penicillins Hives and Rash    Has patient had a PCN reaction causing immediate rash, facial/tongue/throat swelling, SOB or lightheadedness with hypotension: Yes Has patient had a PCN reaction causing severe rash involving mucus membranes or skin necrosis: Yes Has patient had a PCN reaction that required hospitalization No Has patient had a PCN reaction occurring within the last 10 years: Yes If all of the above answers are "NO", then may proceed with Cephalosporin use.   . Tape Rash    Medipore, Coban, and paper tape CAN be tolerated     PHYSICAL EXAM:  ECOG Performance status: 1   Vitals:   01/08/18 1312  BP: 115/65  Pulse: (!) 57  Resp: 16  SpO2: 100%   Filed Weights   01/08/18 1312  Weight: 107 lb 8 oz (48.8 kg)    Physical Exam  Constitutional: She is oriented to person, place, and time. She appears well-developed and well-nourished.  Abdominal: Soft.  Musculoskeletal: Normal range of motion.  Neurological: She is alert and oriented to person, place, and time.  Skin: Skin is warm and dry.  Psychiatric: She has a normal mood and affect. Her behavior is normal. Judgment and thought content normal.     LABORATORY DATA:  I have reviewed the labs as listed.  CBC    Component Value Date/Time   WBC 7.9 12/25/2017 1339   RBC 3.48 (L) 12/25/2017 1339   HGB 11.8 (L) 12/25/2017 1339   HCT 36.2 12/25/2017 1339   PLT 518 (H) 12/25/2017 1339   MCV 104.0 (H) 12/25/2017 1339   MCH 33.9 12/25/2017 1339   MCHC 32.6 12/25/2017 1339   RDW 14.0 12/25/2017 1339   LYMPHSABS 1.6 12/25/2017 1339   MONOABS 0.7 12/25/2017 1339   EOSABS 0.3 12/25/2017 1339   BASOSABS 0.0 12/25/2017 1339   CMP Latest Ref Rng & Units 12/25/2017 10/01/2017  10/01/2017  Glucose 70 - 99 mg/dL 127(H) 105(H) 101(H)  BUN 8 - 23 mg/dL 14 25(H) 20  Creatinine 0.44 - 1.00 mg/dL 1.09(H) 1.50(H) 1.32(H)  Sodium 135 - 145 mmol/L 136 141 140  Potassium 3.5 - 5.1 mmol/L 3.3(L) 3.8 3.8  Chloride 98 - 111 mmol/L 102 103 105  CO2 22 - 32 mmol/L 26 - 23  Calcium 8.9 - 10.3 mg/dL 8.8(L) - 9.3  Total Protein 6.5 - 8.1 g/dL 7.3 - 7.2  Total Bilirubin 0.3 - 1.2 mg/dL 0.3 - 0.6  Alkaline Phos 38 - 126 U/L 66 - 64  AST 15 - 41 U/L 26 - 35  ALT 0 - 44 U/L 14 - 17        ASSESSMENT & PLAN:   Thrombocythemia, essential ([facility]) 1.  Essential thrombocytosis: -She is taking 500 mg hydroxyurea Monday through Friday and thousand milligrams on Saturday and Sunday.  She is tolerating it very well. -She denies any missed doses.  Denies any vasomotor symptoms.  She does have Raynaud's phenomena.  No itching after hot showers was reported. - Her platelet count has increased to 518 today.  Previous platelet count was 415.  Hematocrit has also gone up from 31 to 36.   -I have recommended increasing the dose of Hydrea to 2 tablets daily on Saturday, Sunday and Monday.  The rest of the days she  will take 1 tablet daily.  She will be seen back in 3 months for follow-up.  2.  Left breast cancer: -Diagnosed in 1995 and treated with chemotherapy. -I discussed the results of right breast mammogram dated 07/29/2017 which was BI-RADS Category 1. -PET/CT scan results were also discussed from 07/29/2017 which did not show any metastatic disease.  3.  Melanoma: - She also had a melanoma resected from left upper thigh in 2015.  She follows up with her dermatologist Dr. Nevada Crane.  No evidence of metastasis on PET scan.      Orders placed this encounter:  Orders Placed This Encounter  Procedures  . CBC with Differential/Platelet  . Comprehensive metabolic panel  . Lactate dehydrogenase      Derek Jack, MD [place] [phone]

## 2018-01-08 NOTE — Assessment & Plan Note (Signed)
1.  Essential thrombocytosis: -She is taking 500 mg hydroxyurea Monday through Friday and thousand milligrams on Saturday and Sunday.  She is tolerating it very well. -She denies any missed doses.  Denies any vasomotor symptoms.  She does have Raynaud's phenomena.  No itching after hot showers was reported. - Her platelet count has increased to 518 today.  Previous platelet count was 415.  Hematocrit has also gone up from 31 to 36.   -I have recommended increasing the dose of Hydrea to 2 tablets daily on Saturday, Sunday and Monday.  The rest of the days she will take 1 tablet daily.  She will be seen back in 3 months for follow-up.  2.  Left breast cancer: -Diagnosed in 1995 and treated with chemotherapy. -I discussed the results of right breast mammogram dated 07/29/2017 which was BI-RADS Category 1. -PET/CT scan results were also discussed from 07/29/2017 which did not show any metastatic disease.  3.  Melanoma: - She also had a melanoma resected from left upper thigh in 2015.  She follows up with her dermatologist Dr. Nevada Crane.  No evidence of metastasis on PET scan.

## 2018-01-08 NOTE — Patient Instructions (Signed)
[facility] at [facility] Discharge Instructions  Follow up in 3 months with labs    Thank you for choosing [facility] at [facility] to provide your oncology and hematology care.  To afford each patient quality time with our provider, please arrive at least 15 minutes before your scheduled appointment time.   If you have a lab appointment with the Cancer Center please come in thru the  Main Entrance and check in at the main information desk  You need to re-schedule your appointment should you arrive 10 or more minutes late.  We strive to give you quality time with our providers, and arriving late affects you and other patients whose appointments are after yours.  Also, if you no show three or more times for appointments you may be dismissed from the clinic at the providers discretion.     Again, thank you for choosing [facility].  Our hope is that these requests will decrease the amount of time that you wait before being seen by our physicians.       _____________________________________________________________  Should you have questions after your visit to  Cancer Center, please contact our office at [phone] between the hours of 8:00 a.m. and 4:30 p.m.  Voicemails left after 4:00 p.m. will not be returned until the following business day.  For prescription refill requests, have your pharmacy contact our office and allow 72 hours.    Cancer Center Support Programs:   > Cancer Support Group  2nd Tuesday of the month 1pm-2pm, Journey Room    

## 2018-01-10 ENCOUNTER — Other Ambulatory Visit (HOSPITAL_COMMUNITY): Payer: Self-pay | Admitting: Nurse Practitioner

## 2018-01-10 DIAGNOSIS — D473 Essential (hemorrhagic) thrombocythemia: Secondary | ICD-10-CM

## 2018-01-10 MED ORDER — HYDROXYUREA 500 MG PO CAPS: ORAL_CAPSULE | ORAL | 1 refills | Status: DC

## 2018-01-13 ENCOUNTER — Ambulatory Visit (INDEPENDENT_AMBULATORY_CARE_PROVIDER_SITE_OTHER): Payer: Medicare Other | Admitting: Pulmonary Disease

## 2018-01-13 ENCOUNTER — Encounter: Payer: Self-pay | Admitting: Pulmonary Disease

## 2018-01-13 VITALS — BP 116/68 | HR 74 | Ht 62.0 in | Wt 104.0 lb

## 2018-01-13 DIAGNOSIS — J439 Emphysema, unspecified: Secondary | ICD-10-CM

## 2018-01-13 NOTE — Progress Notes (Signed)
Ashley [name]    [id number]    Jun 23, 1950  Primary Care Physician:Patient, No Pcp Per  Referring Physician: No referring provider defined for this encounter.  Chief complaint: Follow up for COPD  HPI: 67 year old with past medical history of emphysema, GERD, hypertension, hyperlipidemia, carcinoma of the breast.  She has complaints of dyspnea on exertion for the past 1 year.  Symptoms started after lumbar surgery a year ago.  She does not have much symptoms at rest.  Denies any cough, sputum production, wheezing, hemoptysis.  She is currently on Advair but cannot tell if this is helping with her breathing.  Advair was stopped and she was given samples of Anoro.  She feels that did not make any difference with her breathing.  Dyspnea is overall improved.  She is not on any inhalers and feels she does not need them.  Pets: 2 cats and a dog  Occupation: Retired Marine scientist Exposures: No known exposures Smoking history: 25-pack-year smoking history.  Continues to smoke half pack per day Travel History: No recent travel  Interim history: Here for follow-up visit.  Not on inhalers at present States that breathing is doing just fine.  No dyspnea, cough, wheezing, congestion  Continues to smoke  Outpatient Encounter Medications as of 01/13/2018  Medication Sig  . albuterol (PROVENTIL HFA;VENTOLIN HFA) 108 (90 Base) MCG/ACT inhaler Inhale 2 puffs into the lungs every 6 (six) hours as needed for wheezing or shortness of breath.  Marland Kitchen aspirin EC 81 MG tablet Take 81 mg by mouth daily.  . Calcium Carb-Cholecalciferol (CALCIUM 600+D) 600-800 MG-UNIT TABS Take 1 tablet by mouth 2 (two) times daily.  . Cholecalciferol (VITAMIN D-3) 1000 units CAPS Take 1,000 Units daily by mouth.   . clonazePAM (KLONOPIN) 0.5 MG tablet TAKE (1) TABLET BY MOUTH AT BEDTIME.  . cyanocobalamin (,VITAMIN B-12,) 1000 MCG/ML injection INJECT 1 ML INTO THE MUSCLE ONCE MONTHLY AS DIRECTED.  . furosemide (LASIX) 40 MG  tablet Take 40 mg daily by mouth.   . gabapentin (NEURONTIN) 300 MG capsule Take 300 mg by mouth at bedtime.  . hydroxyurea (HYDREA) 500 MG capsule 500 mg once daily Tuesday thru friday and 1000 mg once daily on Saturday, Sunday, and Monday.  . ibandronate (BONIVA) 150 MG tablet Take 1 tablet by mouth every 30 (thirty) days.  Marland Kitchen ibuprofen (ADVIL,MOTRIN) 800 MG tablet Take 800 mg every 8 (eight) hours as needed by mouth for moderate pain.  Marland Kitchen ipratropium-albuterol (DUONEB) 0.5-2.5 (3) MG/3ML SOLN Take 3 mLs by nebulization every 6 (six) hours as needed (shortness of breath).  . irbesartan (AVAPRO) 75 MG tablet Take 75 mg at bedtime by mouth.   . methocarbamol (ROBAXIN) 500 MG tablet Take 500 mg daily as needed by mouth for muscle spasms.   . ondansetron (ZOFRAN) 8 MG tablet Take 1 tablet (8 mg total) by mouth every 8 (eight) hours as needed for nausea or vomiting.  . pantoprazole (PROTONIX) 40 MG tablet Take 40 mg daily by mouth.  . potassium chloride SA (K-DUR,KLOR-CON) 20 MEQ tablet Take 1 tablet (20 mEq total) by mouth 3 (three) times daily.  . rosuvastatin (CRESTOR) 40 MG tablet Take 40 mg at bedtime by mouth.    No facility-administered encounter medications on file as of 01/13/2018.     Allergies as of 01/13/2018 - Review Complete 01/13/2018  Allergen Reaction Noted  . Penicillins Hives and Rash 12/14/2011  . Tape Rash 12/26/2015    Past Medical History:  Diagnosis Date  . Adenocarcinoma of left breast ([place]) 01/09/2016  . Arthritis   . Ascending aortic aneurysm ([place])   . Cancer [facility]) 1995   breast, left, mastectomy/chemo  . Chest pain    Possibly cardiac. No evidence of ischemia/injury based upon normal troponin I. Chest discomfort could be tachycardia induced supply demand mismatch.   . CHF (congestive heart failure) ([place]) 11/17/2015   after surgery   . Colon adenomas   . Coronary artery disease   . Dyspnea    with exertion  . Emphysema of lung ([place]) 11/17/2015  . Essential  hypertension, benign   . GERD (gastroesophageal reflux disease)   . Hyperlipidemia   . Hypertension   . Melanoma ([place]) 01/09/2016  . Palpitations   . Pernicious anemia 03/06/2016  . Pure hypercholesterolemia   . Thrombocythemia, essential ([place]) 01/09/2016  . Thrombocytosis ([place])    Idiopathic    Past Surgical History:  Procedure Laterality Date  . ANTERIOR AND POSTERIOR REPAIR N/A 12/09/2014   Procedure: ANTERIOR (CYSTOCELE) AND POSTERIOR REPAIR (RECTOCELE);  Surgeon: Bjorn Loser, MD;  Location: [facility];  Service: Urology;  Laterality: N/A;  . ANTERIOR CERVICAL DECOMPRESSION/DISCECTOMY FUSION 4 LEVELS Right 10/03/2016   Procedure: ANTERIOR CERVICAL DECOMPRESSION FUSION, CERVICAL 4-5, CERVICAL 5-6, CERVICAL 6-7, CERVICAL 7 TO THORACIC 1 WITH INSTRUMENTATION AND ALLOGRAFT;  Surgeon: Phylliss Bob, MD;  Location: [place];  Service: Orthopedics;  Laterality: Right;  ANTERIOR CERVICAL DECOMPRESSION FUSION, CERVICAL 4-5, CERVICAL 5-6, CERVICAL 6-7, CERVICAL 7 TO THORACIC 1 WITH INSTRUMENTATION AND ALLOGRAFT; REQUEST 4 HO  . ANTERIOR LAT LUMBAR FUSION Left 11/16/2015   Procedure: LEFT SIDED LATERAL INTERBODY FUSION, LUMBAR 2-3, LUMBAR 3-4, LUMBAR 4-5 WITH INSTRUMENTATION;  Surgeon: Phylliss Bob, MD;  Location: [facility];  Service: Orthopedics;  Laterality: Left;  LEFT SIDED LATEARL INTERBODY FUSION, LUMBAR 2-3, LUMBAR 3-4, LUMBAR 4-5 WITH INSTRUMENTATION   . APPENDECTOMY    . BONE MARROW ASPIRATION  07/2012  . BONE MARROW BIOPSY  07/2012  . BREAST SURGERY    . CARDIAC CATHETERIZATION    . CARDIAC CATHETERIZATION N/A 01/20/2016   Procedure: Left Heart Cath and Coronary Angiography;  Surgeon: Burnell Blanks, MD;  Location: [facility];  Service: Cardiovascular;  Laterality: N/A;  . COLONOSCOPY  11/29/2010   Procedure: COLONOSCOPY;  Surgeon: Rogene Houston, MD;  [facility];  Service: Endoscopy;  Laterality: N/A;  . COLONOSCOPY N/A 02/18/2014   Procedure: COLONOSCOPY;  Surgeon:  Rogene Houston, MD;  [facility];  Service: Endoscopy;  Laterality: N/A;  1030  . COLONOSCOPY N/A 02/25/2017   Procedure: COLONOSCOPY;  Surgeon: Rogene Houston, MD;  [facility];  Service: Endoscopy;  Laterality: N/A;  10:55  . CYSTOSCOPY N/A 12/09/2014   Procedure: CYSTOSCOPY;  Surgeon: Bjorn Loser, MD;  Location: [facility];  Service: Urology;  Laterality: N/A;  . ESOPHAGEAL DILATION N/A 02/25/2017   Procedure: ESOPHAGEAL DILATION;  Surgeon: Rogene Houston, MD;  [facility];  Service: Endoscopy;  Laterality: N/A;  . ESOPHAGOGASTRODUODENOSCOPY N/A 02/25/2017   Procedure: ESOPHAGOGASTRODUODENOSCOPY (EGD);  Surgeon: Rogene Houston, MD;  [facility];  Service: Endoscopy;  Laterality: N/A;  . IR GENERIC HISTORICAL  01/11/2016   IR RADIOLOGY PERIPHERAL GUIDED IV START 01/11/2016 Saverio Danker, PA-C [facility]  . IR GENERIC HISTORICAL  01/11/2016   IR US GUIDE VASC ACCESS RIGHT 01/11/2016 Saverio Danker, PA-C [facility]  . MASTECTOMY     left  . OVARIAN CYST SURGERY  x2  . POLYPECTOMY  02/25/2017   Procedure: POLYPECTOMY;  Surgeon: Rogene Houston, MD;  [facility];  Service: Endoscopy;;  . SALPINGOOPHORECTOMY Bilateral 12/09/2014   Procedure: SALPINGO OOPHORECTOMY;  Surgeon: Servando Salina, MD;  Location: [facility];  Service: Gynecology;  Laterality: Bilateral;  . TUBAL LIGATION    . VAGINAL HYSTERECTOMY N/A 12/09/2014   Procedure: HYSTERECTOMY VAGINAL;  Surgeon: Servando Salina, MD;  Location: [facility];  Service: Gynecology;  Laterality: N/A;    Family History  Problem Relation Age of Onset  . Hypertension Mother   . Heart failure Mother   . Congestive Heart Failure Mother   . COPD Mother   . Pernicious anemia Mother   . Cancer Mother        lung  . Hypertension Father   . CAD Father   . Heart attack Father   . Hypertension Sister   . Cancer Other   . Celiac disease Other     Social History    Socioeconomic History  . Marital status: Divorced    Spouse name: Not on file  . Number of children: 2  . Years of education: Not on file  . Highest education level: Not on file  Occupational History  . Not on file  Social Needs  . Financial resource strain: Not on file  . Food insecurity:    Worry: Not on file    Inability: Not on file  . Transportation needs:    Medical: Not on file    Non-medical: Not on file  Tobacco Use  . Smoking status: Current Every Day Smoker    Packs/day: 0.50    Years: 50.00    Pack years: 25.00    Types: Cigarettes  . Smokeless tobacco: Never Used  Substance and Sexual Activity  . Alcohol use: No  . Drug use: No  . Sexual activity: Yes    Birth control/protection: Post-menopausal  Lifestyle  . Physical activity:    Days per week: Not on file    Minutes per session: Not on file  . Stress: Not on file  Relationships  . Social connections:    Talks on phone: Not on file    Gets together: Not on file    Attends religious service: Not on file    Active member of club or organization: Not on file    Attends meetings of clubs or organizations: Not on file    Relationship status: Not on file  . Intimate partner violence:    Fear of current or ex partner: Not on file    Emotionally abused: Not on file    Physically abused: Not on file    Forced sexual activity: Not on file  Other Topics Concern  . Not on file  Social History Narrative  . Not on file    Review of systems: Review of Systems  Constitutional: Negative for fever and chills.  HENT: Negative.   Eyes: Negative for blurred vision.  Respiratory: as per HPI  Cardiovascular: Negative for chest pain and palpitations.  Gastrointestinal: Negative for vomiting, diarrhea, blood per rectum. Genitourinary: Negative for dysuria, urgency, frequency and hematuria.  Musculoskeletal: Negative for myalgias, back pain and joint pain.  Skin: Negative for itching and rash.  Neurological:  Negative for dizziness, tremors, focal weakness, seizures and loss of consciousness.  Endo/Heme/Allergies: Negative for environmental allergies.  Psychiatric/Behavioral: Negative for depression, suicidal ideas and hallucinations.  All other systems reviewed and are negative.  Physical Exam: Blood pressure 116/68, pulse  74, height '5\' 2"'  (1.575 m), weight 104 lb (47.2 kg), SpO2 94 %. Gen:      No acute distress HEENT:  EOMI, sclera anicteric Neck:     No masses; no thyromegaly Lungs:    Clear to auscultation bilaterally; normal respiratory effort CV:         Regular rate and rhythm; no murmurs Abd:      + bowel sounds; soft, non-tender; no palpable masses, no distension Ext:    No edema; adequate peripheral perfusion Skin:      Warm and dry; no rash Neuro: alert and oriented x 3 Psych: normal mood and affect  Data Reviewed: Spirometry 02/01/17 FVC 3.19 [114%], FEV1 2.11 [99%], F/F 66 Mild obstruction  PFTs 05/01/17 FVC 3.20 [119%], FEV1 2.24 [110%], F/F 70, TLC 121%, DLCO 47% Mild obstruction, reduced diffusion capacity.  CT chest 01/21/17-moderate-severe emphysematous changes, bibasilar atelectasis/scarring.  CT high-resolution 03/29/17-bronchial wall thickening, emphysematous changes.  Multiple small pulmonary nodules, atherosclerosis, coronary artery disease.  No interstitial lung disease, mild aortic aneurysm. PET scan 07/29/2017- 10 mm left lower lobe nodule is not FDG avid.  No other uptake. I have reviewed the images personally  CBC 03/04/17- Absolute eos count 285  Assessment:  COPD Mild obstruction with emphysematous changes.  Been tried on different inhalers but has not made any change to her symptoms.  She has very mild symptoms and does not need to be on controller medication.  Continue albuterol as needed  CT scan reviewed with multiple small lung nodules, no evidence of interstitial lung disease.   Recommended annual low-dose screening CTs of the chest.  She prefers to  get this done with her primary care  Active smoker Smoking cessation discussed.  She is not interested in quitting just yet.  Plan/Recommendations: - Albuterol as needed - Smoking cessation.  Follow-up as needed  Marshell Garfinkel MD [facility] 01/13/2018, 12:17 PM  CC: No ref. provider found

## 2018-01-13 NOTE — Patient Instructions (Signed)
I am glad you are doing well with your breathing Continue to work on smoking cessation Follow-up as needed.

## 2018-01-14 ENCOUNTER — Telehealth (HOSPITAL_COMMUNITY): Payer: Self-pay | Admitting: *Deleted

## 2018-01-15 ENCOUNTER — Ambulatory Visit (HOSPITAL_COMMUNITY): Payer: Medicare Other | Admitting: Hematology

## 2018-01-20 NOTE — Telephone Encounter (Signed)
Taken care of

## 2018-02-04 ENCOUNTER — Other Ambulatory Visit (HOSPITAL_COMMUNITY): Payer: Self-pay | Admitting: Nurse Practitioner

## 2018-02-04 DIAGNOSIS — D473 Essential (hemorrhagic) thrombocythemia: Secondary | ICD-10-CM

## 2018-02-24 ENCOUNTER — Other Ambulatory Visit (HOSPITAL_COMMUNITY): Payer: Self-pay | Admitting: Nurse Practitioner

## 2018-02-24 ENCOUNTER — Other Ambulatory Visit: Payer: Self-pay | Admitting: Nurse Practitioner

## 2018-02-24 DIAGNOSIS — I714 Abdominal aortic aneurysm, without rupture, unspecified: Secondary | ICD-10-CM

## 2018-03-14 ENCOUNTER — Encounter (HOSPITAL_COMMUNITY): Payer: Self-pay

## 2018-03-14 ENCOUNTER — Ambulatory Visit (HOSPITAL_COMMUNITY): Admission: RE | Admit: 2018-03-14 | Payer: Medicare Other | Source: Ambulatory Visit

## 2018-03-28 ENCOUNTER — Other Ambulatory Visit (HOSPITAL_COMMUNITY): Payer: Self-pay | Admitting: Nurse Practitioner

## 2018-03-28 DIAGNOSIS — I716 Thoracoabdominal aortic aneurysm, without rupture, unspecified: Secondary | ICD-10-CM

## 2018-04-02 ENCOUNTER — Other Ambulatory Visit (HOSPITAL_COMMUNITY): Payer: Self-pay | Admitting: Nurse Practitioner

## 2018-04-02 DIAGNOSIS — D473 Essential (hemorrhagic) thrombocythemia: Secondary | ICD-10-CM

## 2018-04-02 MED ORDER — HYDROXYUREA 500 MG PO CAPS: ORAL_CAPSULE | ORAL | 0 refills | Status: DC

## 2018-04-03 ENCOUNTER — Inpatient Hospital Stay (HOSPITAL_COMMUNITY): Payer: Medicare Other | Attending: Hematology

## 2018-04-03 DIAGNOSIS — I10 Essential (primary) hypertension: Secondary | ICD-10-CM | POA: Diagnosis not present

## 2018-04-03 DIAGNOSIS — Z9221 Personal history of antineoplastic chemotherapy: Secondary | ICD-10-CM | POA: Insufficient documentation

## 2018-04-03 DIAGNOSIS — Z85820 Personal history of malignant melanoma of skin: Secondary | ICD-10-CM | POA: Insufficient documentation

## 2018-04-03 DIAGNOSIS — Z79899 Other long term (current) drug therapy: Secondary | ICD-10-CM | POA: Insufficient documentation

## 2018-04-03 DIAGNOSIS — Z853 Personal history of malignant neoplasm of breast: Secondary | ICD-10-CM | POA: Diagnosis not present

## 2018-04-03 DIAGNOSIS — F17210 Nicotine dependence, cigarettes, uncomplicated: Secondary | ICD-10-CM | POA: Insufficient documentation

## 2018-04-03 DIAGNOSIS — D473 Essential (hemorrhagic) thrombocythemia: Secondary | ICD-10-CM | POA: Diagnosis not present

## 2018-04-03 DIAGNOSIS — Z7982 Long term (current) use of aspirin: Secondary | ICD-10-CM | POA: Insufficient documentation

## 2018-04-03 DIAGNOSIS — Z90710 Acquired absence of both cervix and uterus: Secondary | ICD-10-CM | POA: Diagnosis not present

## 2018-04-03 DIAGNOSIS — Z9012 Acquired absence of left breast and nipple: Secondary | ICD-10-CM | POA: Diagnosis not present

## 2018-04-03 DIAGNOSIS — C50912 Malignant neoplasm of unspecified site of left female breast: Secondary | ICD-10-CM

## 2018-04-03 LAB — CBC WITH DIFFERENTIAL/PLATELET
ABS IMMATURE GRANULOCYTES: 0.07 10*3/uL (ref 0.00–0.07)
BASOS ABS: 0 10*3/uL (ref 0.0–0.1)
BASOS PCT: 0 %
EOS PCT: 1 %
Eosinophils Absolute: 0.1 10*3/uL (ref 0.0–0.5)
HCT: 33.3 % — ABNORMAL LOW (ref 36.0–46.0)
HEMOGLOBIN: 11 g/dL — AB (ref 12.0–15.0)
Immature Granulocytes: 1 %
LYMPHS PCT: 25 %
Lymphs Abs: 2.8 10*3/uL (ref 0.7–4.0)
MCH: 33.4 pg (ref 26.0–34.0)
MCHC: 33 g/dL (ref 30.0–36.0)
MCV: 101.2 fL — ABNORMAL HIGH (ref 80.0–100.0)
Monocytes Absolute: 1 10*3/uL (ref 0.1–1.0)
Monocytes Relative: 9 %
NEUTROS ABS: 7.4 10*3/uL (ref 1.7–7.7)
NRBC: 0 % (ref 0.0–0.2)
Neutrophils Relative %: 64 %
PLATELETS: 584 10*3/uL — AB (ref 150–400)
RBC: 3.29 MIL/uL — AB (ref 3.87–5.11)
RDW: 23.4 % — ABNORMAL HIGH (ref 11.5–15.5)
WBC: 11.4 10*3/uL — AB (ref 4.0–10.5)

## 2018-04-03 LAB — COMPREHENSIVE METABOLIC PANEL
ALBUMIN: 3.7 g/dL (ref 3.5–5.0)
ALT: 15 U/L (ref 0–44)
ANION GAP: 8 (ref 5–15)
AST: 20 U/L (ref 15–41)
Alkaline Phosphatase: 46 U/L (ref 38–126)
BUN: 31 mg/dL — ABNORMAL HIGH (ref 8–23)
CHLORIDE: 101 mmol/L (ref 98–111)
CO2: 29 mmol/L (ref 22–32)
Calcium: 8.7 mg/dL — ABNORMAL LOW (ref 8.9–10.3)
Creatinine, Ser: 1.21 mg/dL — ABNORMAL HIGH (ref 0.44–1.00)
GFR calc non Af Amer: 46 mL/min — ABNORMAL LOW (ref >60)
GFR, EST AFRICAN AMERICAN: 53 mL/min — AB (ref >60)
GLUCOSE: 91 mg/dL (ref 70–99)
Potassium: 3.3 mmol/L — ABNORMAL LOW (ref 3.5–5.1)
SODIUM: 138 mmol/L (ref 135–145)
Total Bilirubin: 0.4 mg/dL (ref 0.3–1.2)
Total Protein: 6.5 g/dL (ref 6.5–8.1)

## 2018-04-03 LAB — LACTATE DEHYDROGENASE: LDH: 130 U/L (ref 98–192)

## 2018-04-10 ENCOUNTER — Ambulatory Visit (HOSPITAL_COMMUNITY): Payer: Medicare Other | Admitting: Hematology

## 2018-04-11 ENCOUNTER — Ambulatory Visit (HOSPITAL_COMMUNITY): Payer: Medicare Other | Admitting: Hematology

## 2018-04-15 ENCOUNTER — Ambulatory Visit (HOSPITAL_COMMUNITY)
Admission: RE | Admit: 2018-04-15 | Discharge: 2018-04-15 | Disposition: A | Discharge status: Home / Self Care | Payer: Medicare Other | Source: Ambulatory Visit | Attending: Nurse Practitioner | Admitting: Nurse Practitioner

## 2018-04-15 ENCOUNTER — Inpatient Hospital Stay (HOSPITAL_BASED_OUTPATIENT_CLINIC_OR_DEPARTMENT_OTHER): Payer: Medicare Other | Admitting: Hematology

## 2018-04-15 ENCOUNTER — Encounter (HOSPITAL_COMMUNITY): Payer: Self-pay | Admitting: Hematology

## 2018-04-15 ENCOUNTER — Other Ambulatory Visit: Payer: Self-pay

## 2018-04-15 VITALS — BP 124/83 | HR 60 | Temp 98.1°F | Resp 16 | Wt 109.0 lb

## 2018-04-15 DIAGNOSIS — Z9012 Acquired absence of left breast and nipple: Secondary | ICD-10-CM | POA: Diagnosis not present

## 2018-04-15 DIAGNOSIS — Z79899 Other long term (current) drug therapy: Secondary | ICD-10-CM

## 2018-04-15 DIAGNOSIS — Z85820 Personal history of malignant melanoma of skin: Secondary | ICD-10-CM

## 2018-04-15 DIAGNOSIS — I714 Abdominal aortic aneurysm, without rupture, unspecified: Secondary | ICD-10-CM

## 2018-04-15 DIAGNOSIS — I10 Essential (primary) hypertension: Secondary | ICD-10-CM

## 2018-04-15 DIAGNOSIS — I716 Thoracoabdominal aortic aneurysm, without rupture, unspecified: Secondary | ICD-10-CM

## 2018-04-15 DIAGNOSIS — D473 Essential (hemorrhagic) thrombocythemia: Secondary | ICD-10-CM | POA: Diagnosis not present

## 2018-04-15 DIAGNOSIS — Z853 Personal history of malignant neoplasm of breast: Secondary | ICD-10-CM

## 2018-04-15 DIAGNOSIS — Z9221 Personal history of antineoplastic chemotherapy: Secondary | ICD-10-CM

## 2018-04-15 DIAGNOSIS — Z7982 Long term (current) use of aspirin: Secondary | ICD-10-CM

## 2018-04-15 DIAGNOSIS — F17210 Nicotine dependence, cigarettes, uncomplicated: Secondary | ICD-10-CM

## 2018-04-15 DIAGNOSIS — D696 Thrombocytopenia, unspecified: Secondary | ICD-10-CM

## 2018-04-15 MED ORDER — IOPAMIDOL (ISOVUE-370) INJECTION 76%
75.0000 mL | Freq: Once | INTRAVENOUS | Status: AC | PRN
  Administered 2018-04-15: 75 mL via INTRAVENOUS

## 2018-04-15 NOTE — Assessment & Plan Note (Signed)
1.  Essential thrombocytosis: -At last visit her platelet count was 518.  I have increased her hydroxyurea to 500 mg Tuesday through Friday and 1 g on Saturday, Sunday and Monday.  She is tolerating it very well. -She denies any missed doses.  She denies any new vasomotor symptoms.  She does have ray nods phenomena. -She reports mild itching because of dry skin.  No itching after hot showers noted. - We reviewed her blood work today.  Her platelet count increased to 584.  Hematocrit is 33.3.  She reports that she had a sinus infection and took antibiotics 2 weeks ago.  Hence I did not recommend any changes in her Hydrea. -I will reevaluate her in 2 months with repeat blood counts.  If they continue to be high, I will increase hydroxyurea to 1 g daily.  2.  Left breast cancer: -Diagnosed in 1995 and treated with chemotherapy. -I discussed the results of right breast mammogram dated 07/29/2017 which was BI-RADS Category 1. -PET/CT scan results were also discussed from 07/29/2017 which did not show any metastatic disease.  3.  Melanoma: - She also had a melanoma resected from left upper thigh in 2015.  She follows up with her dermatologist Dr. Nevada Crane.  No evidence of metastasis on PET scan.

## 2018-04-15 NOTE — Patient Instructions (Signed)
[place] at [facility] Discharge Instructions  Follow up in 2 months with labs   Thank you for choosing [place] at [facility] to provide your oncology and hematology care.  To afford each patient quality time with our provider, please arrive at least 15 minutes before your scheduled appointment time.   If you have a lab appointment with the [facility] please come in thru the  Main Entrance and check in at the main information desk  You need to re-schedule your appointment should you arrive 10 or more minutes late.  We strive to give you quality time with our providers, and arriving late affects you and other patients whose appointments are after yours.  Also, if you no show three or more times for appointments you may be dismissed from the clinic at the providers discretion.     Again, thank you for choosing [facility].  Our hope is that these requests will decrease the amount of time that you wait before being seen by our physicians.       _____________________________________________________________  Should you have questions after your visit to [facility], please contact our office at [phone] between the hours of 8:00 a.m. and 4:30 p.m.  Voicemails left after 4:00 p.m. will not be returned until the following business day.  For prescription refill requests, have your pharmacy contact our office and allow 72 hours.    Cancer Center Support Programs:   > Cancer Support Group  2nd Tuesday of the month 1pm-2pm, Journey Room

## 2018-04-15 NOTE — Progress Notes (Signed)
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 The 3 liver lesions are not hypermetabolic. The radiologist suspects they may be subtly present on prior CT chest from 10/2013, to further reassuring that these are likely benign lesions.     Melanoma ([place])   07/09/2013 Initial Biopsy    Initial biopsy L upper thigh/buttocks melanoma    07/20/2013 Surgery    Excision L lateral buttock/upper thigh melanoma, clear margins    07/20/2013 Pathology Results    Breslow depth 1.02 mm, pT2a       INTERVAL HISTORY:  Ms. Harlacher 67 y.o. female returns for routine follow-up for adenocarcinoma of the left breast AND thrombocytopenia. She is here today with her daughter. She has had no problems with the medication increase and is tolerating the medication well. She recently had a sinus infection. She just finished her antibiotics 2 weeks ago. Denies any nausea, vomiting, or diarrhea. Denies any new pains. Had not noticed any recent bleeding such as epistaxis, hematuria or hematochezia. Denies recent chest pain on exertion, shortness of breath on minimal exertion, pre-syncopal episodes, or palpitations. Denies any numbness or tingling in hands  or feet. Denies any recent fevers, infections, or recent hospitalizations. She reports her appetite and energy level at 75%.    REVIEW OF SYSTEMS:  Review of Systems  All other systems reviewed and are negative.    PAST MEDICAL/SURGICAL HISTORY:  Past Medical History:  Diagnosis Date  . 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 Arthritis   . Ascending aortic aneurysm ([place])   . Cancer [facility]) 1995   breast, left, mastectomy/chemo  . Chest pain    Possibly cardiac. No evidence of ischemia/injury based upon normal troponin I. Chest discomfort could be tachycardia induced supply demand mismatch.   . CHF (congestive heart failure) ([place]) 11/17/2015   after surgery   . Colon adenomas   . Coronary artery disease   . Dyspnea    with exertion  . Emphysema of lung ([place]) 11/17/2015  . Essential hypertension, benign   . GERD (gastroesophageal reflux disease)   . Hyperlipidemia   . Hypertension   . Melanoma ([place]) 01/09/2016  . Palpitations   . Pernicious anemia 03/06/2016  . Pure hypercholesterolemia   . 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 Thrombocytosis ([place])    Idiopathic   Past Surgical History:  Procedure Laterality Date  . ANTERIOR AND POSTERIOR REPAIR N/A 12/09/2014   Procedure: ANTERIOR (CYSTOCELE) AND POSTERIOR REPAIR (RECTOCELE);  Surgeon: Bjorn Loser, MD;  Location: [facility];  Service: Urology;  Laterality: N/A;  . ANTERIOR CERVICAL DECOMPRESSION/DISCECTOMY FUSION 4 LEVELS Right 10/03/2016  Procedure: ANTERIOR CERVICAL DECOMPRESSION FUSION, CERVICAL 4-5, CERVICAL 5-6, CERVICAL 6-7, CERVICAL 7 TO THORACIC 1 WITH INSTRUMENTATION AND ALLOGRAFT;  Surgeon: Phylliss Bob, MD;  Location: [facility];  Service: Orthopedics;  Laterality: Right;  ANTERIOR CERVICAL DECOMPRESSION FUSION, CERVICAL 4-5, CERVICAL 5-6, CERVICAL 6-7, CERVICAL 7 TO THORACIC 1 WITH INSTRUMENTATION AND ALLOGRAFT; REQUEST 4 HO  . ANTERIOR LAT LUMBAR FUSION Left 11/16/2015   Procedure: LEFT SIDED LATERAL  INTERBODY FUSION, LUMBAR 2-3, LUMBAR 3-4, LUMBAR 4-5 WITH INSTRUMENTATION;  Surgeon: Phylliss Bob, MD;  Location: [facility];  Service: Orthopedics;  Laterality: Left;  LEFT SIDED LATEARL INTERBODY FUSION, LUMBAR 2-3, LUMBAR 3-4, LUMBAR 4-5 WITH INSTRUMENTATION   . APPENDECTOMY    . BONE MARROW ASPIRATION  07/2012  . BONE MARROW BIOPSY  07/2012  . BREAST SURGERY    . CARDIAC CATHETERIZATION    . CARDIAC CATHETERIZATION N/A 01/20/2016   Procedure: Left Heart Cath and Coronary Angiography;  Surgeon: Burnell Blanks, MD;  Location: [facility];  Service: Cardiovascular;  Laterality: N/A;  . COLONOSCOPY  11/29/2010   Procedure: COLONOSCOPY;  Surgeon: Rogene Houston, MD;  [facility];  Service: Endoscopy;  Laterality: N/A;  . COLONOSCOPY N/A 02/18/2014   Procedure: COLONOSCOPY;  Surgeon: Rogene Houston, MD;  [facility];  Service: Endoscopy;  Laterality: N/A;  1030  . COLONOSCOPY N/A 02/25/2017   Procedure: COLONOSCOPY;  Surgeon: Rogene Houston, MD;  [facility];  Service: Endoscopy;  Laterality: N/A;  10:55  . CYSTOSCOPY N/A 12/09/2014   Procedure: CYSTOSCOPY;  Surgeon: Bjorn Loser, MD;  Location: [facility];  Service: Urology;  Laterality: N/A;  . ESOPHAGEAL DILATION N/A 02/25/2017   Procedure: ESOPHAGEAL DILATION;  Surgeon: Rogene Houston, MD;  [facility];  Service: Endoscopy;  Laterality: N/A;  . ESOPHAGOGASTRODUODENOSCOPY N/A 02/25/2017   Procedure: ESOPHAGOGASTRODUODENOSCOPY (EGD);  Surgeon: Rogene Houston, MD;  [facility];  Service: Endoscopy;  Laterality: N/A;  . IR GENERIC HISTORICAL  01/11/2016   IR RADIOLOGY PERIPHERAL GUIDED IV START 01/11/2016 Saverio Danker, PA-C [facility]  . IR GENERIC HISTORICAL  01/11/2016   IR US GUIDE VASC ACCESS RIGHT 01/11/2016 Saverio Danker, PA-C [facility]  . MASTECTOMY     left  . OVARIAN CYST SURGERY     x2  . POLYPECTOMY  02/25/2017   Procedure: POLYPECTOMY;  Surgeon:  Rogene Houston, MD;  [facility];  Service: Endoscopy;;  . SALPINGOOPHORECTOMY Bilateral 12/09/2014   Procedure: SALPINGO OOPHORECTOMY;  Surgeon: Servando Salina, MD;  Location: [facility];  Service: Gynecology;  Laterality: Bilateral;  . TUBAL LIGATION    . VAGINAL HYSTERECTOMY N/A 12/09/2014   Procedure: HYSTERECTOMY VAGINAL;  Surgeon: Servando Salina, MD;  Location: [facility];  Service: Gynecology;  Laterality: N/A;     SOCIAL HISTORY:  Social History   Socioeconomic History  . Marital status: Divorced    Spouse name: Not on file  . Number of children: 2  . Years of education: Not on file  . Highest education level: Not on file  Occupational History  . Not on file  Social Needs  . Financial resource strain: Not on file  . Food insecurity:    Worry: Not on file    Inability: Not on file  . Transportation needs:    Medical: Not on file    Non-medical: Not on file  Tobacco Use  . Smoking status: Current Every Day Smoker    Packs/day: 0.50  Years: 50.00    Pack years: 25.00    Types: Cigarettes  . Smokeless tobacco: Never Used  Substance and Sexual Activity  . Alcohol use: No  . Drug use: No  . Sexual activity: Yes    Birth control/protection: Post-menopausal  Lifestyle  . Physical activity:    Days per week: Not on file    Minutes per session: Not on file  . Stress: Not on file  Relationships  . Social connections:    Talks on phone: Not on file    Gets together: Not on file    Attends religious service: Not on file    Active member of club or organization: Not on file    Attends meetings of clubs or organizations: Not on file    Relationship status: Not on file  . Intimate partner violence:    Fear of current or ex partner: Not on file    Emotionally abused: Not on file    Physically abused: Not on file    Forced sexual activity: Not on file  Other Topics Concern  . Not on file  Social History Narrative  . Not on file    FAMILY HISTORY:    Family History  Problem Relation Age of Onset  . Hypertension Mother   . Heart failure Mother   . Congestive Heart Failure Mother   . COPD Mother   . Pernicious anemia Mother   . Cancer Mother        lung  . Hypertension Father   . CAD Father   . Heart attack Father   . Hypertension Sister   . Cancer Other   . Celiac disease Other     CURRENT MEDICATIONS:  Outpatient Encounter Medications as of 04/15/2018  Medication Sig  . albuterol (PROVENTIL HFA;VENTOLIN HFA) 108 (90 Base) MCG/ACT inhaler Inhale 2 puffs into the lungs every 6 (six) hours as needed for wheezing or shortness of breath.  Marland Kitchen aspirin EC 81 MG tablet Take 81 mg by mouth daily.  . Calcium Carb-Cholecalciferol (CALCIUM 600+D) 600-800 MG-UNIT TABS Take 1 tablet by mouth 2 (two) times daily.  . Cholecalciferol (VITAMIN D-3) 1000 units CAPS Take 1,000 Units daily by mouth.   . clonazePAM (KLONOPIN) 0.5 MG tablet TAKE (1) TABLET BY MOUTH AT BEDTIME.  . cyanocobalamin (,VITAMIN B-12,) 1000 MCG/ML injection INJECT 1 ML INTO THE MUSCLE ONCE MONTHLY AS DIRECTED.  . furosemide (LASIX) 40 MG tablet Take 40 mg daily by mouth.   . gabapentin (NEURONTIN) 300 MG capsule Take 300 mg by mouth at bedtime.  . hydroxyurea (HYDREA) 500 MG capsule TAKE 1 CAPSULE DAILY TUESDAY-FRIDAY AND 2 CAPSULES ON SATURDAY AND SUNDAY AND MONDAY.  Marland Kitchen ibandronate (BONIVA) 150 MG tablet Take 1 tablet by mouth every 30 (thirty) days.  Marland Kitchen ibuprofen (ADVIL,MOTRIN) 800 MG tablet Take 800 mg every 8 (eight) hours as needed by mouth for moderate pain.  Marland Kitchen ipratropium-albuterol (DUONEB) 0.5-2.5 (3) MG/3ML SOLN Take 3 mLs by nebulization every 6 (six) hours as needed (shortness of breath).  . irbesartan (AVAPRO) 75 MG tablet Take 75 mg at bedtime by mouth.   . methocarbamol (ROBAXIN) 500 MG tablet Take 500 mg daily as needed by mouth for muscle spasms.   . ondansetron (ZOFRAN) 8 MG tablet Take 1 tablet (8 mg total) by mouth every 8 (eight) hours as needed for nausea  or vomiting.  . pantoprazole (PROTONIX) 40 MG tablet Take 40 mg daily by mouth.  . potassium chloride SA (K-DUR,KLOR-CON) 20 MEQ tablet  Take 1 tablet (20 mEq total) by mouth 3 (three) times daily.  . rosuvastatin (CRESTOR) 40 MG tablet Take 40 mg at bedtime by mouth.    No facility-administered encounter medications on file as of 04/15/2018.     ALLERGIES:  Allergies  Allergen Reactions  . Penicillins Hives and Rash    Has patient had a PCN reaction causing immediate rash, facial/tongue/throat swelling, SOB or lightheadedness with hypotension: Yes Has patient had a PCN reaction causing severe rash involving mucus membranes or skin necrosis: Yes Has patient had a PCN reaction that required hospitalization No Has patient had a PCN reaction occurring within the last 10 years: Yes If all of the above answers are "NO", then may proceed with Cephalosporin use.   . Tape Rash    Medipore, Coban, and paper tape CAN be tolerated     PHYSICAL EXAM:  ECOG Performance status: 1  Vitals:   04/15/18 1000  BP: 124/83  Pulse: 60  Resp: 16  Temp: 98.1 F (36.7 C)  SpO2: 100%   Filed Weights   04/15/18 1000  Weight: 109 lb (49.4 kg)    Physical Exam Constitutional:      Appearance: Normal appearance. She is normal weight.  Abdominal:     General: Abdomen is flat.     Palpations: Abdomen is soft.  Musculoskeletal: Normal range of motion.  Skin:    General: Skin is warm and dry.  Neurological:     General: No focal deficit present.     Mental Status: She is alert and oriented to person, place, and time. Mental status is at baseline.  Psychiatric:        Mood and Affect: Mood normal.        Behavior: Behavior normal.        Thought Content: Thought content normal.        Judgment: Judgment normal.   Abdomen: No palpable splenomegaly.  No palpable lymphadenopathy.   LABORATORY DATA:  I have reviewed the labs as listed.  CBC    Component Value Date/Time   WBC 11.4 (H)  04/03/2018 1336   RBC 3.29 (L) 04/03/2018 1336   HGB 11.0 (L) 04/03/2018 1336   HCT 33.3 (L) 04/03/2018 1336   PLT 584 (H) 04/03/2018 1336   MCV 101.2 (H) 04/03/2018 1336   MCH 33.4 04/03/2018 1336   MCHC 33.0 04/03/2018 1336   RDW 23.4 (H) 04/03/2018 1336   LYMPHSABS 2.8 04/03/2018 1336   MONOABS 1.0 04/03/2018 1336   EOSABS 0.1 04/03/2018 1336   BASOSABS 0.0 04/03/2018 1336   CMP Latest Ref Rng & Units 04/03/2018 12/25/2017 10/01/2017  Glucose 70 - 99 mg/dL 91 127(H) 105(H)  BUN 8 - 23 mg/dL 31(H) 14 25(H)  Creatinine 0.44 - 1.00 mg/dL 1.21(H) 1.09(H) 1.50(H)  Sodium 135 - 145 mmol/L 138 136 141  Potassium 3.5 - 5.1 mmol/L 3.3(L) 3.3(L) 3.8  Chloride 98 - 111 mmol/L 101 102 103  CO2 22 - 32 mmol/L 29 26 -  Calcium 8.9 - 10.3 mg/dL 8.7(L) 8.8(L) -  Total Protein 6.5 - 8.1 g/dL 6.5 7.3 -  Total Bilirubin 0.3 - 1.2 mg/dL 0.4 0.3 -  Alkaline Phos 38 - 126 U/L 46 66 -  AST 15 - 41 U/L 20 26 -  ALT 0 - 44 U/L 15 14 -       DIAGNOSTIC IMAGING:  I have independently reviewed the scans and discussed with the patient.   I have reviewed Francene Finders, NP's note and  agree with the documentation.  I personally performed a face-to-face visit, made revisions and my assessment and plan is as follows.    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  Essential thrombocytosis: -At last visit her platelet count was 518.  I have increased her hydroxyurea to 500 mg Tuesday through Friday and 1 g on Saturday, Sunday and Monday.  She is tolerating it very well. -She denies any missed doses.  She denies any new vasomotor symptoms.  She does have ray nods phenomena. -She reports mild itching because of dry skin.  No itching after hot showers noted. - We reviewed her blood work today.  Her platelet count increased to 584.  Hematocrit is 33.3.  She reports that she had a sinus infection and took antibiotics 2 weeks ago.  Hence I did not recommend any changes in her Hydrea. -I will  reevaluate her in 2 months with repeat blood counts.  If they continue to be high, I will increase hydroxyurea to 1 g daily.  2.  Left breast cancer: -Diagnosed in 1995 and treated with chemotherapy. -I discussed the results of right breast mammogram dated 07/29/2017 which was BI-RADS Category 1. -PET/CT scan results were also discussed from 07/29/2017 which did not show any metastatic disease.  3.  Melanoma: - She also had a melanoma resected from left upper thigh in 2015.  She follows up with her dermatologist Dr. Nevada Crane.  No evidence of metastasis on PET scan.      Orders placed this encounter:  Orders Placed This Encounter  Procedures  . Lactate dehydrogenase  . CBC with Differential/Platelet  . Comprehensive metabolic panel      Derek Jack, MD [place] [phone]

## 2018-05-01 ENCOUNTER — Other Ambulatory Visit (HOSPITAL_COMMUNITY): Payer: Self-pay | Admitting: Nurse Practitioner

## 2018-05-01 DIAGNOSIS — D473 Essential (hemorrhagic) thrombocythemia: Secondary | ICD-10-CM

## 2018-05-29 ENCOUNTER — Other Ambulatory Visit (HOSPITAL_COMMUNITY): Payer: Self-pay | Admitting: Nurse Practitioner

## 2018-05-29 DIAGNOSIS — D473 Essential (hemorrhagic) thrombocythemia: Secondary | ICD-10-CM

## 2018-06-17 ENCOUNTER — Inpatient Hospital Stay (HOSPITAL_COMMUNITY): Payer: Medicare Other | Attending: Hematology

## 2018-06-17 ENCOUNTER — Other Ambulatory Visit: Payer: Self-pay

## 2018-06-17 DIAGNOSIS — Z9221 Personal history of antineoplastic chemotherapy: Secondary | ICD-10-CM | POA: Insufficient documentation

## 2018-06-17 DIAGNOSIS — Z79899 Other long term (current) drug therapy: Secondary | ICD-10-CM | POA: Insufficient documentation

## 2018-06-17 DIAGNOSIS — Z791 Long term (current) use of non-steroidal anti-inflammatories (NSAID): Secondary | ICD-10-CM | POA: Insufficient documentation

## 2018-06-17 DIAGNOSIS — R918 Other nonspecific abnormal finding of lung field: Secondary | ICD-10-CM | POA: Diagnosis not present

## 2018-06-17 DIAGNOSIS — F17210 Nicotine dependence, cigarettes, uncomplicated: Secondary | ICD-10-CM | POA: Diagnosis not present

## 2018-06-17 DIAGNOSIS — I10 Essential (primary) hypertension: Secondary | ICD-10-CM | POA: Diagnosis not present

## 2018-06-17 DIAGNOSIS — D473 Essential (hemorrhagic) thrombocythemia: Secondary | ICD-10-CM | POA: Insufficient documentation

## 2018-06-17 DIAGNOSIS — Z923 Personal history of irradiation: Secondary | ICD-10-CM | POA: Insufficient documentation

## 2018-06-17 DIAGNOSIS — Z85820 Personal history of malignant melanoma of skin: Secondary | ICD-10-CM | POA: Insufficient documentation

## 2018-06-17 DIAGNOSIS — Z853 Personal history of malignant neoplasm of breast: Secondary | ICD-10-CM | POA: Diagnosis not present

## 2018-06-17 DIAGNOSIS — Z7982 Long term (current) use of aspirin: Secondary | ICD-10-CM | POA: Insufficient documentation

## 2018-06-17 DIAGNOSIS — D696 Thrombocytopenia, unspecified: Secondary | ICD-10-CM

## 2018-06-17 DIAGNOSIS — Z9012 Acquired absence of left breast and nipple: Secondary | ICD-10-CM | POA: Insufficient documentation

## 2018-06-17 LAB — CBC WITH DIFFERENTIAL/PLATELET
Abs Immature Granulocytes: 0.02 10*3/uL (ref 0.00–0.07)
Basophils Absolute: 0 10*3/uL (ref 0.0–0.1)
Basophils Relative: 1 %
Eosinophils Absolute: 0.2 10*3/uL (ref 0.0–0.5)
Eosinophils Relative: 4 %
HCT: 34.9 % — ABNORMAL LOW (ref 36.0–46.0)
Hemoglobin: 10.8 g/dL — ABNORMAL LOW (ref 12.0–15.0)
Immature Granulocytes: 0 %
Lymphocytes Relative: 19 %
Lymphs Abs: 1.1 10*3/uL (ref 0.7–4.0)
MCH: 35.1 pg — ABNORMAL HIGH (ref 26.0–34.0)
MCHC: 30.9 g/dL (ref 30.0–36.0)
MCV: 113.3 fL — ABNORMAL HIGH (ref 80.0–100.0)
Monocytes Absolute: 0.3 10*3/uL (ref 0.1–1.0)
Monocytes Relative: 5 %
NEUTROS ABS: 4 10*3/uL (ref 1.7–7.7)
Neutrophils Relative %: 71 %
PLATELETS: 359 10*3/uL (ref 150–400)
RBC: 3.08 MIL/uL — AB (ref 3.87–5.11)
RDW: 15.7 % — AB (ref 11.5–15.5)
WBC: 5.6 10*3/uL (ref 4.0–10.5)
nRBC: 0 % (ref 0.0–0.2)

## 2018-06-17 LAB — COMPREHENSIVE METABOLIC PANEL
ALT: 12 U/L (ref 0–44)
AST: 23 U/L (ref 15–41)
Albumin: 4 g/dL (ref 3.5–5.0)
Alkaline Phosphatase: 57 U/L (ref 38–126)
Anion gap: 8 (ref 5–15)
BUN: 20 mg/dL (ref 8–23)
CHLORIDE: 105 mmol/L (ref 98–111)
CO2: 24 mmol/L (ref 22–32)
Calcium: 9 mg/dL (ref 8.9–10.3)
Creatinine, Ser: 0.95 mg/dL (ref 0.44–1.00)
GFR calc Af Amer: >60 mL/min (ref >60)
Glucose, Bld: 158 mg/dL — ABNORMAL HIGH (ref 70–99)
POTASSIUM: 3.5 mmol/L (ref 3.5–5.1)
Sodium: 137 mmol/L (ref 135–145)
Total Bilirubin: 0.5 mg/dL (ref 0.3–1.2)
Total Protein: 7.3 g/dL (ref 6.5–8.1)

## 2018-06-17 LAB — LACTATE DEHYDROGENASE: LDH: 160 U/L (ref 98–192)

## 2018-06-23 ENCOUNTER — Other Ambulatory Visit (HOSPITAL_COMMUNITY): Payer: Self-pay | Admitting: Nurse Practitioner

## 2018-06-23 DIAGNOSIS — C50912 Malignant neoplasm of unspecified site of left female breast: Secondary | ICD-10-CM

## 2018-06-24 ENCOUNTER — Encounter (HOSPITAL_COMMUNITY): Payer: Self-pay | Admitting: Hematology

## 2018-06-24 ENCOUNTER — Other Ambulatory Visit: Payer: Self-pay

## 2018-06-24 ENCOUNTER — Inpatient Hospital Stay (HOSPITAL_BASED_OUTPATIENT_CLINIC_OR_DEPARTMENT_OTHER): Payer: Medicare Other | Admitting: Hematology

## 2018-06-24 VITALS — BP 116/74 | HR 60 | Temp 97.8°F | Resp 16 | Wt 106.4 lb

## 2018-06-24 DIAGNOSIS — Z9221 Personal history of antineoplastic chemotherapy: Secondary | ICD-10-CM

## 2018-06-24 DIAGNOSIS — Z853 Personal history of malignant neoplasm of breast: Secondary | ICD-10-CM

## 2018-06-24 DIAGNOSIS — D473 Essential (hemorrhagic) thrombocythemia: Secondary | ICD-10-CM

## 2018-06-24 DIAGNOSIS — Z85820 Personal history of malignant melanoma of skin: Secondary | ICD-10-CM

## 2018-06-24 DIAGNOSIS — I10 Essential (primary) hypertension: Secondary | ICD-10-CM

## 2018-06-24 DIAGNOSIS — Z79899 Other long term (current) drug therapy: Secondary | ICD-10-CM

## 2018-06-24 DIAGNOSIS — R918 Other nonspecific abnormal finding of lung field: Secondary | ICD-10-CM

## 2018-06-24 DIAGNOSIS — Z923 Personal history of irradiation: Secondary | ICD-10-CM | POA: Diagnosis not present

## 2018-06-24 DIAGNOSIS — Z7982 Long term (current) use of aspirin: Secondary | ICD-10-CM

## 2018-06-24 DIAGNOSIS — F17210 Nicotine dependence, cigarettes, uncomplicated: Secondary | ICD-10-CM

## 2018-06-24 DIAGNOSIS — Z791 Long term (current) use of non-steroidal anti-inflammatories (NSAID): Secondary | ICD-10-CM

## 2018-06-24 DIAGNOSIS — Z9012 Acquired absence of left breast and nipple: Secondary | ICD-10-CM

## 2018-06-24 NOTE — Assessment & Plan Note (Signed)
1.  Essential thrombocytosis: -At last visit her platelet count was 518.  I have increased her hydroxyurea to 500 mg Tuesday through Friday and 1 g on Saturday, Sunday and Monday.  She is tolerating it very well. -She denies any recent fevers or infections.  Denies any missed doses.  Denies any vasomotor symptoms. -She has mild itching after shower and thinks it is because of dry skin. -We reviewed her blood work.  Platelet count has come down below 400.  Hemoglobin is ranging between 10 and 11. -We will continue the same regimen.  She will be back in 4 months with repeat blood work.    2.  Left breast cancer: -Diagnosed in 1995 and treated with chemotherapy. -I discussed the results of right breast mammogram dated 07/29/2017 which was BI-RADS Category 1. -PET/CT scan results were also discussed from 07/29/2017 which did not show any metastatic disease.  3.  Melanoma: - She also had a melanoma resected from left upper thigh in 2015.  She follows up with her dermatologist Dr. Nevada Crane.  No evidence of metastasis on PET scan.  4.  Lung nodules: -CT scan of the chest on 04/15/2018 showed enlarging right upper lobe lung nodule measuring 1 cm and sub-solid. -I will repeat a CT of the chest prior to next visit.

## 2018-06-24 NOTE — Progress Notes (Signed)
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 The 3 liver lesions are not hypermetabolic. The radiologist suspects they may be subtly present on prior CT chest from 10/2013, to further reassuring that these are likely benign lesions.     Melanoma ([place])   07/09/2013 Initial Biopsy    Initial biopsy L upper thigh/buttocks melanoma    07/20/2013 Surgery    Excision L lateral buttock/upper thigh melanoma, clear margins    07/20/2013 Pathology Results    Breslow depth 1.02 mm, pT2a       INTERVAL HISTORY:  Ms. Hassing 68 y.o. female returns for routine follow-up for adenocarcinoma of the left breast and thrombocytopenia. She is here alone today. She has been doing well since her last visit. She reports she still has itching after hot showers. She is taking her hydrea as prescribed with no complications. Denies any vomiting or diarrhea. Denies any new pains. Had not noticed any recent bleeding such as epistaxis, hematuria or hematochezia. Denies recent chest pain on exertion, shortness of breath on minimal exertion, pre-syncopal episodes, or palpitations. Denies any numbness or tingling in hands or feet. Denies any recent fevers,  infections, or recent hospitalizations. Patient reports appetite at 100% and energy level at 100%. She is eating well and maintaining her weight at this time.    REVIEW OF SYSTEMS:  Review of Systems  Gastrointestinal: Positive for constipation and nausea.  Psychiatric/Behavioral: Positive for sleep disturbance.  All other systems reviewed and are negative.    PAST MEDICAL/SURGICAL HISTORY:  Past Medical History:  Diagnosis Date  . Adenocarcinoma of left breast ([place]) 01/09/2016  . Arthritis   . Ascending aortic aneurysm ([place])   . Cancer [facility]) 1995   breast, left, mastectomy/chemo  . Chest pain    Possibly cardiac. No evidence of ischemia/injury based upon normal troponin I. Chest discomfort could be tachycardia induced supply demand mismatch.   . CHF (congestive heart failure) ([place]) 11/17/2015   after surgery   . Colon adenomas   . Coronary artery disease   . Dyspnea    with exertion  . Emphysema of lung ([place]) 11/17/2015  . Essential hypertension, benign   . GERD (gastroesophageal reflux disease)   . Hyperlipidemia   . Hypertension   . Melanoma ([place]) 01/09/2016  . Palpitations   . Pernicious anemia 03/06/2016  . Pure hypercholesterolemia   . 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 Thrombocytosis ([place])    Idiopathic   Past Surgical History:  Procedure Laterality Date  . ANTERIOR AND POSTERIOR REPAIR N/A 12/09/2014   Procedure: ANTERIOR (CYSTOCELE) AND POSTERIOR REPAIR (RECTOCELE);  Surgeon: Bjorn Loser, MD;  Location:  [facility];  Service: Urology;  Laterality: N/A;  . ANTERIOR CERVICAL DECOMPRESSION/DISCECTOMY FUSION 4 LEVELS Right 10/03/2016   Procedure: ANTERIOR CERVICAL DECOMPRESSION FUSION, CERVICAL 4-5, CERVICAL 5-6, CERVICAL 6-7, CERVICAL 7 TO THORACIC 1 WITH INSTRUMENTATION AND ALLOGRAFT;  Surgeon: Phylliss Bob, MD;  Location: [place];  Service: Orthopedics;  Laterality: Right;  ANTERIOR CERVICAL DECOMPRESSION FUSION, CERVICAL 4-5, CERVICAL 5-6, CERVICAL 6-7,  CERVICAL 7 TO THORACIC 1 WITH INSTRUMENTATION AND ALLOGRAFT; REQUEST 4 HO  . ANTERIOR LAT LUMBAR FUSION Left 11/16/2015   Procedure: LEFT SIDED LATERAL INTERBODY FUSION, LUMBAR 2-3, LUMBAR 3-4, LUMBAR 4-5 WITH INSTRUMENTATION;  Surgeon: Phylliss Bob, MD;  Location: [place];  Service: Orthopedics;  Laterality: Left;  LEFT SIDED LATEARL INTERBODY FUSION, LUMBAR 2-3, LUMBAR 3-4, LUMBAR 4-5 WITH INSTRUMENTATION   . APPENDECTOMY    . BONE MARROW ASPIRATION  07/2012  . BONE MARROW BIOPSY  07/2012  . BREAST SURGERY    . CARDIAC CATHETERIZATION    . CARDIAC CATHETERIZATION N/A 01/20/2016   Procedure: Left Heart Cath and Coronary Angiography;  Surgeon: Burnell Blanks, MD;  Location: [place] [facility];  Service: Cardiovascular;  Laterality: N/A;  . COLONOSCOPY  11/29/2010   Procedure: COLONOSCOPY;  Surgeon: Rogene Houston, MD;  [facility];  Service: Endoscopy;  Laterality: N/A;  . COLONOSCOPY N/A 02/18/2014   Procedure: COLONOSCOPY;  Surgeon: Rogene Houston, MD;  [facility];  Service: Endoscopy;  Laterality: N/A;  1030  . COLONOSCOPY N/A 02/25/2017   Procedure: COLONOSCOPY;  Surgeon: Rogene Houston, MD;  [facility];  Service: Endoscopy;  Laterality: N/A;  10:55  . CYSTOSCOPY N/A 12/09/2014   Procedure: CYSTOSCOPY;  Surgeon: Bjorn Loser, MD;  Location: [facility];  Service: Urology;  Laterality: N/A;  . ESOPHAGEAL DILATION N/A 02/25/2017   Procedure: ESOPHAGEAL DILATION;  Surgeon: Rogene Houston, MD;  [facility];  Service: Endoscopy;  Laterality: N/A;  . ESOPHAGOGASTRODUODENOSCOPY N/A 02/25/2017   Procedure: ESOPHAGOGASTRODUODENOSCOPY (EGD);  Surgeon: Rogene Houston, MD;  [facility];  Service: Endoscopy;  Laterality: N/A;  . IR GENERIC HISTORICAL  01/11/2016   IR RADIOLOGY PERIPHERAL GUIDED IV START 01/11/2016 Saverio Danker, PA-C [facility]  . IR GENERIC HISTORICAL  01/11/2016   IR US GUIDE VASC ACCESS RIGHT 01/11/2016 Saverio Danker, PA-C [facility]  . MASTECTOMY     left  . OVARIAN CYST SURGERY     x2  . POLYPECTOMY  02/25/2017   Procedure: POLYPECTOMY;  Surgeon: Rogene Houston, MD;  [facility];  Service: Endoscopy;;  . SALPINGOOPHORECTOMY Bilateral 12/09/2014   Procedure: SALPINGO OOPHORECTOMY;  Surgeon: Servando Salina, MD;  Location: [facility];  Service: Gynecology;  Laterality: Bilateral;  . TUBAL LIGATION    . VAGINAL HYSTERECTOMY N/A 12/09/2014   Procedure: HYSTERECTOMY VAGINAL;  Surgeon: Servando Salina, MD;  Location: [facility];  Service: Gynecology;  Laterality: N/A;     SOCIAL HISTORY:  Social History   Socioeconomic History  . Marital status: Divorced    Spouse name: Not on file  . Number of children: 2  . Years of education: Not on file  . Highest education level: Not on file  Occupational History  . Not on file  Social Needs  . Financial resource strain: Not on file  . Food insecurity:    Worry: Not on file    Inability: Not on file  . Transportation needs:    Medical: Not on file    Non-medical: Not  on file  Tobacco Use  . Smoking status: Current Every Day Smoker    Packs/day: 0.50    Years: 50.00    Pack years: 25.00    Types: Cigarettes  . Smokeless tobacco: Never Used  Substance and Sexual Activity  . Alcohol use: No  . Drug use: No  . Sexual activity: Yes    Birth control/protection: Post-menopausal  Lifestyle  . Physical activity:    Days per week: Not on file    Minutes per session: Not on file  . Stress: Not on file  Relationships  . Social connections:    Talks on phone: Not on file    Gets together: Not on file    Attends religious service: Not on file    Active member of club or organization: Not on file    Attends meetings of clubs or organizations: Not on file    Relationship status: Not on file  . Intimate partner violence:    Fear of current or ex partner: Not on file    Emotionally abused: Not on file    Physically abused: Not on  file    Forced sexual activity: Not on file  Other Topics Concern  . Not on file  Social History Narrative  . Not on file    FAMILY HISTORY:  Family History  Problem Relation Age of Onset  . Hypertension Mother   . Heart failure Mother   . Congestive Heart Failure Mother   . COPD Mother   . Pernicious anemia Mother   . Cancer Mother        lung  . Hypertension Father   . CAD Father   . Heart attack Father   . Hypertension Sister   . Cancer Other   . Celiac disease Other     CURRENT MEDICATIONS:  Outpatient Encounter Medications as of 06/24/2018  Medication Sig  . albuterol (PROVENTIL HFA;VENTOLIN HFA) 108 (90 Base) MCG/ACT inhaler Inhale 2 puffs into the lungs every 6 (six) hours as needed for wheezing or shortness of breath.  Marland Kitchen amLODipine (NORVASC) 2.5 MG tablet   . aspirin EC 81 MG tablet Take 81 mg by mouth daily.  . Calcium Carb-Cholecalciferol (CALCIUM 600+D) 600-800 MG-UNIT TABS Take 1 tablet by mouth 2 (two) times daily.  . Cholecalciferol (VITAMIN D-3) 1000 units CAPS Take 1,000 Units daily by mouth.   . clonazePAM (KLONOPIN) 0.5 MG tablet TAKE ONE TABLET BY MOUTH AT BEDTIME.  . cyanocobalamin (,VITAMIN B-12,) 1000 MCG/ML injection INJECT 1 ML INTO THE MUSCLE ONCE MONTHLY AS DIRECTED.  . furosemide (LASIX) 40 MG tablet Take 40 mg daily by mouth.   . gabapentin (NEURONTIN) 300 MG capsule Take 300 mg by mouth at bedtime.  . hydroxyurea (HYDREA) 500 MG capsule TAKE 1 CAPSULE DAILY TUESDAY-FRIDAY AND 2 CAPSULES ON SATURDAY AND SUNDAY AND MONDAY.  Marland Kitchen ibandronate (BONIVA) 150 MG tablet Take 1 tablet by mouth every 30 (thirty) days.  Marland Kitchen ibuprofen (ADVIL,MOTRIN) 800 MG tablet Take 800 mg every 8 (eight) hours as needed by mouth for moderate pain.  Marland Kitchen ipratropium-albuterol (DUONEB) 0.5-2.5 (3) MG/3ML SOLN Take 3 mLs by nebulization every 6 (six) hours as needed (shortness of breath).  . methocarbamol (ROBAXIN) 500 MG tablet Take 500 mg daily as needed by mouth for muscle spasms.    . ondansetron (ZOFRAN) 8 MG tablet Take 1 tablet (8 mg total) by mouth every 8 (eight) hours as needed for nausea or vomiting.  . pantoprazole (PROTONIX) 40 MG tablet Take 40  mg daily by mouth.  . potassium chloride SA (K-DUR,KLOR-CON) 20 MEQ tablet Take 1 tablet (20 mEq total) by mouth 3 (three) times daily.  . rosuvastatin (CRESTOR) 40 MG tablet Take 40 mg at bedtime by mouth.   . traMADol (ULTRAM) 50 MG tablet   . [DISCONTINUED] irbesartan (AVAPRO) 75 MG tablet Take 75 mg at bedtime by mouth.   . [DISCONTINUED] predniSONE (DELTASONE) 20 MG tablet   . [DISCONTINUED] Vitamin D, Ergocalciferol, (DRISDOL) 1.25 MG (50000 UT) CAPS capsule Take by mouth.   No facility-administered encounter medications on file as of 06/24/2018.     ALLERGIES:  Allergies  Allergen Reactions  . Penicillins Hives and Rash    Has patient had a PCN reaction causing immediate rash, facial/tongue/throat swelling, SOB or lightheadedness with hypotension: Yes Has patient had a PCN reaction causing severe rash involving mucus membranes or skin necrosis: Yes Has patient had a PCN reaction that required hospitalization No Has patient had a PCN reaction occurring within the last 10 years: Yes If all of the above answers are "NO", then may proceed with Cephalosporin use.   . Tape Rash    Medipore, Coban, and paper tape CAN be tolerated     PHYSICAL EXAM:  ECOG Performance status: 1  Vitals:   06/24/18 1055  BP: 116/74  Pulse: 60  Resp: 16  Temp: 97.8 F (36.6 C)   Filed Weights   06/24/18 1055  Weight: 106 lb 6.4 oz (48.3 kg)    Physical Exam Constitutional:      Appearance: Normal appearance. She is normal weight.  Cardiovascular:     Rate and Rhythm: Normal rate and regular rhythm.     Heart sounds: Normal heart sounds.  Pulmonary:     Effort: Pulmonary effort is normal.     Breath sounds: Normal breath sounds.  Abdominal:     General: Abdomen is flat.     Palpations: Abdomen is soft.    Musculoskeletal: Normal range of motion.  Skin:    General: Skin is warm and dry.  Neurological:     Mental Status: She is alert and oriented to person, place, and time. Mental status is at baseline.  Psychiatric:        Mood and Affect: Mood normal.        Behavior: Behavior normal.        Thought Content: Thought content normal.        Judgment: Judgment normal.    No palpable splenomegaly.  No palpable lymphadenopathy. Skin has no rashes or ulcers.  LABORATORY DATA:  I have reviewed the labs as listed.  CBC    Component Value Date/Time   WBC 5.6 06/17/2018 1054   RBC 3.08 (L) 06/17/2018 1054   HGB 10.8 (L) 06/17/2018 1054   HCT 34.9 (L) 06/17/2018 1054   PLT 359 06/17/2018 1054   MCV 113.3 (H) 06/17/2018 1054   MCH 35.1 (H) 06/17/2018 1054   MCHC 30.9 06/17/2018 1054   RDW 15.7 (H) 06/17/2018 1054   LYMPHSABS 1.1 06/17/2018 1054   MONOABS 0.3 06/17/2018 1054   EOSABS 0.2 06/17/2018 1054   BASOSABS 0.0 06/17/2018 1054   CMP Latest Ref Rng & Units 06/17/2018 04/03/2018 12/25/2017  Glucose 70 - 99 mg/dL 158(H) 91 127(H)  BUN 8 - 23 mg/dL 20 31(H) 14  Creatinine 0.44 - 1.00 mg/dL 0.95 1.21(H) 1.09(H)  Sodium 135 - 145 mmol/L 137 138 136  Potassium 3.5 - 5.1 mmol/L 3.5 3.3(L) 3.3(L)  Chloride 98 - 111  mmol/L 105 101 102  CO2 22 - 32 mmol/L '24 29 26  ' Calcium 8.9 - 10.3 mg/dL 9.0 8.7(L) 8.8(L)  Total Protein 6.5 - 8.1 g/dL 7.3 6.5 7.3  Total Bilirubin 0.3 - 1.2 mg/dL 0.5 0.4 0.3  Alkaline Phos 38 - 126 U/L 57 46 66  AST 15 - 41 U/L '23 20 26  ' ALT 0 - 44 U/L '12 15 14       ' DIAGNOSTIC IMAGING:  I have independently reviewed the scans and discussed with the patient.   I have reviewed Francene Finders, NP's note and agree with the documentation.  I personally performed a face-to-face visit, made revisions and my assessment and plan is as follows.    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  Essential thrombocytosis: -At last visit her platelet count  was 518.  I have increased her hydroxyurea to 500 mg Tuesday through Friday and 1 g on Saturday, Sunday and Monday.  She is tolerating it very well. -She denies any recent fevers or infections.  Denies any missed doses.  Denies any vasomotor symptoms. -She has mild itching after shower and thinks it is because of dry skin. -We reviewed her blood work.  Platelet count has come down below 400.  Hemoglobin is ranging between 10 and 11. -We will continue the same regimen.  She will be back in 4 months with repeat blood work.    2.  Left breast cancer: -Diagnosed in 1995 and treated with chemotherapy. -I discussed the results of right breast mammogram dated 07/29/2017 which was BI-RADS Category 1. -PET/CT scan results were also discussed from 07/29/2017 which did not show any metastatic disease.  3.  Melanoma: - She also had a melanoma resected from left upper thigh in 2015.  She follows up with her dermatologist Dr. Nevada Crane.  No evidence of metastasis on PET scan.  4.  Lung nodules: -CT scan of the chest on 04/15/2018 showed enlarging right upper lobe lung nodule measuring 1 cm and sub-solid. -I will repeat a CT of the chest prior to next visit.      Orders placed this encounter:  Orders Placed This Encounter  Procedures  . CT CHEST W CONTRAST  . CBC with Differential/Platelet  . Comprehensive metabolic panel  . Lactate dehydrogenase      Derek Jack, MD [place] [phone]

## 2018-06-24 NOTE — Patient Instructions (Signed)
[facility] Discharge Instructions     Thank you for choosing [facility] to provide your oncology and hematology care.  To afford each patient quality time with our provider, please arrive at least 15 minutes before your scheduled appointment time.   If you have a lab appointment with the Cancer Center please come in thru the  Main Entrance and check in at the main information desk  You need to re-schedule your appointment should you arrive 10 or more minutes late.  We strive to give you quality time with our providers, and arriving late affects you and other patients whose appointments are after yours.  Also, if you no show three or more times for appointments you may be dismissed from the clinic at the providers discretion.     Again, thank you for choosing [facility].  Our hope is that these requests will decrease the amount of time that you wait before being seen by our physicians.       _____________________________________________________________  Should you have questions after your visit to [facility], please contact our office at [phone] between the hours of 8:00 a.m. and 4:30 p.m.  Voicemails left after 4:00 p.m. will not be returned until the following business day.  For prescription refill requests, have your pharmacy contact our office and allow 72 hours.    Cancer Center Support Programs:   > Cancer Support Group  2nd Tuesday of the month 1pm-2pm, Journey Room    

## 2018-07-01 ENCOUNTER — Other Ambulatory Visit (HOSPITAL_COMMUNITY): Payer: Self-pay | Admitting: Nurse Practitioner

## 2018-07-01 DIAGNOSIS — D473 Essential (hemorrhagic) thrombocythemia: Secondary | ICD-10-CM

## 2018-07-25 ENCOUNTER — Other Ambulatory Visit (HOSPITAL_COMMUNITY): Payer: Self-pay | Admitting: Nurse Practitioner

## 2018-07-25 DIAGNOSIS — C50912 Malignant neoplasm of unspecified site of left female breast: Secondary | ICD-10-CM

## 2018-07-30 ENCOUNTER — Other Ambulatory Visit (HOSPITAL_COMMUNITY): Payer: Self-pay | Admitting: *Deleted

## 2018-07-30 ENCOUNTER — Other Ambulatory Visit (HOSPITAL_COMMUNITY): Payer: Self-pay | Admitting: Nurse Practitioner

## 2018-07-30 DIAGNOSIS — C50912 Malignant neoplasm of unspecified site of left female breast: Secondary | ICD-10-CM

## 2018-07-30 DIAGNOSIS — D473 Essential (hemorrhagic) thrombocythemia: Secondary | ICD-10-CM

## 2018-07-30 MED ORDER — HYDROXYUREA 500 MG PO CAPS: ORAL_CAPSULE | ORAL | 0 refills | Status: DC

## 2018-07-31 ENCOUNTER — Other Ambulatory Visit (HOSPITAL_COMMUNITY): Payer: Self-pay | Admitting: *Deleted

## 2018-08-04 MED ORDER — CLONAZEPAM 0.5 MG PO TABS: 0.5000 mg | ORAL_TABLET | Freq: Every day | ORAL | 0 refills | Status: DC

## 2018-09-02 ENCOUNTER — Other Ambulatory Visit (HOSPITAL_COMMUNITY): Payer: Self-pay | Admitting: Hematology

## 2018-09-02 DIAGNOSIS — Z1231 Encounter for screening mammogram for malignant neoplasm of breast: Secondary | ICD-10-CM

## 2018-09-10 ENCOUNTER — Ambulatory Visit (HOSPITAL_COMMUNITY)
Admission: RE | Admit: 2018-09-10 | Discharge: 2018-09-10 | Disposition: A | Discharge status: Home / Self Care | Payer: Medicare Other | Source: Ambulatory Visit | Attending: Hematology | Admitting: Hematology

## 2018-09-10 ENCOUNTER — Other Ambulatory Visit: Payer: Self-pay

## 2018-09-10 DIAGNOSIS — Z1231 Encounter for screening mammogram for malignant neoplasm of breast: Secondary | ICD-10-CM | POA: Diagnosis not present

## 2018-09-23 ENCOUNTER — Other Ambulatory Visit (HOSPITAL_COMMUNITY): Payer: Self-pay | Admitting: Nurse Practitioner

## 2018-09-23 DIAGNOSIS — D473 Essential (hemorrhagic) thrombocythemia: Secondary | ICD-10-CM

## 2018-09-23 DIAGNOSIS — C50912 Malignant neoplasm of unspecified site of left female breast: Secondary | ICD-10-CM

## 2018-10-07 ENCOUNTER — Ambulatory Visit (HOSPITAL_COMMUNITY)
Admission: RE | Admit: 2018-10-07 | Discharge: 2018-10-07 | Disposition: A | Discharge status: Home / Self Care | Payer: Medicare Other | Source: Ambulatory Visit | Attending: Nurse Practitioner | Admitting: Nurse Practitioner

## 2018-10-07 ENCOUNTER — Encounter (HOSPITAL_COMMUNITY): Payer: Self-pay

## 2018-10-07 ENCOUNTER — Other Ambulatory Visit (HOSPITAL_COMMUNITY): Payer: Self-pay | Admitting: Nurse Practitioner

## 2018-10-07 ENCOUNTER — Inpatient Hospital Stay (HOSPITAL_COMMUNITY): Payer: Medicare Other | Attending: Hematology

## 2018-10-07 ENCOUNTER — Other Ambulatory Visit: Payer: Self-pay

## 2018-10-07 DIAGNOSIS — Z791 Long term (current) use of non-steroidal anti-inflammatories (NSAID): Secondary | ICD-10-CM | POA: Diagnosis not present

## 2018-10-07 DIAGNOSIS — I10 Essential (primary) hypertension: Secondary | ICD-10-CM | POA: Diagnosis not present

## 2018-10-07 DIAGNOSIS — Z79899 Other long term (current) drug therapy: Secondary | ICD-10-CM | POA: Insufficient documentation

## 2018-10-07 DIAGNOSIS — Z853 Personal history of malignant neoplasm of breast: Secondary | ICD-10-CM | POA: Diagnosis not present

## 2018-10-07 DIAGNOSIS — Z9221 Personal history of antineoplastic chemotherapy: Secondary | ICD-10-CM | POA: Insufficient documentation

## 2018-10-07 DIAGNOSIS — R918 Other nonspecific abnormal finding of lung field: Secondary | ICD-10-CM | POA: Diagnosis present

## 2018-10-07 DIAGNOSIS — Z7982 Long term (current) use of aspirin: Secondary | ICD-10-CM | POA: Insufficient documentation

## 2018-10-07 DIAGNOSIS — F17210 Nicotine dependence, cigarettes, uncomplicated: Secondary | ICD-10-CM | POA: Diagnosis not present

## 2018-10-07 DIAGNOSIS — Z923 Personal history of irradiation: Secondary | ICD-10-CM | POA: Diagnosis not present

## 2018-10-07 DIAGNOSIS — D473 Essential (hemorrhagic) thrombocythemia: Secondary | ICD-10-CM | POA: Insufficient documentation

## 2018-10-07 DIAGNOSIS — Z9012 Acquired absence of left breast and nipple: Secondary | ICD-10-CM | POA: Diagnosis not present

## 2018-10-07 DIAGNOSIS — Z85820 Personal history of malignant melanoma of skin: Secondary | ICD-10-CM | POA: Diagnosis not present

## 2018-10-07 LAB — CBC WITH DIFFERENTIAL/PLATELET
Abs Immature Granulocytes: 0.02 10*3/uL (ref 0.00–0.07)
Basophils Absolute: 0 10*3/uL (ref 0.0–0.1)
Basophils Relative: 1 %
Eosinophils Absolute: 0.3 10*3/uL (ref 0.0–0.5)
Eosinophils Relative: 4 %
HCT: 34.3 % — ABNORMAL LOW (ref 36.0–46.0)
Hemoglobin: 11.3 g/dL — ABNORMAL LOW (ref 12.0–15.0)
Immature Granulocytes: 0 %
Lymphocytes Relative: 24 %
Lymphs Abs: 1.5 10*3/uL (ref 0.7–4.0)
MCH: 37.3 pg — ABNORMAL HIGH (ref 26.0–34.0)
MCHC: 32.9 g/dL (ref 30.0–36.0)
MCV: 113.2 fL — ABNORMAL HIGH (ref 80.0–100.0)
Monocytes Absolute: 0.6 10*3/uL (ref 0.1–1.0)
Monocytes Relative: 9 %
Neutro Abs: 4 10*3/uL (ref 1.7–7.7)
Neutrophils Relative %: 62 %
Platelets: 390 10*3/uL (ref 150–400)
RBC: 3.03 MIL/uL — ABNORMAL LOW (ref 3.87–5.11)
RDW: 16.1 % — ABNORMAL HIGH (ref 11.5–15.5)
WBC: 6.5 10*3/uL (ref 4.0–10.5)
nRBC: 0 % (ref 0.0–0.2)

## 2018-10-07 LAB — COMPREHENSIVE METABOLIC PANEL
ALT: 15 U/L (ref 0–44)
AST: 24 U/L (ref 15–41)
Albumin: 3.9 g/dL (ref 3.5–5.0)
Alkaline Phosphatase: 65 U/L (ref 38–126)
Anion gap: 11 (ref 5–15)
BUN: 25 mg/dL — ABNORMAL HIGH (ref 8–23)
CO2: 26 mmol/L (ref 22–32)
Calcium: 9.2 mg/dL (ref 8.9–10.3)
Chloride: 100 mmol/L (ref 98–111)
Creatinine, Ser: 1.27 mg/dL — ABNORMAL HIGH (ref 0.44–1.00)
GFR calc Af Amer: 50 mL/min — ABNORMAL LOW (ref >60)
GFR calc non Af Amer: 43 mL/min — ABNORMAL LOW (ref >60)
Glucose, Bld: 99 mg/dL (ref 70–99)
Potassium: 3.9 mmol/L (ref 3.5–5.1)
Sodium: 137 mmol/L (ref 135–145)
Total Bilirubin: 0.5 mg/dL (ref 0.3–1.2)
Total Protein: 7.5 g/dL (ref 6.5–8.1)

## 2018-10-07 LAB — POCT I-STAT CREATININE: Creatinine, Ser: 1.9 mg/dL — ABNORMAL HIGH (ref 0.44–1.00)

## 2018-10-07 LAB — LACTATE DEHYDROGENASE: LDH: 181 U/L (ref 98–192)

## 2018-10-07 MED ORDER — IOHEXOL 300 MG/ML  SOLN: 75.0000 mL | Freq: Once | INTRAMUSCULAR | Status: DC | PRN

## 2018-10-09 ENCOUNTER — Telehealth (HOSPITAL_COMMUNITY): Payer: Self-pay | Admitting: Emergency Medicine

## 2018-10-09 NOTE — Telephone Encounter (Signed)
Spoke to patient via telephone. Explained to her that [place], NP is ok w/ patient having CT scan w/ contrast as long as we do fluids after the scan. We are in works of getting this worked out and scheduled with CT scheduling. She verbalized understanding of this.

## 2018-10-14 ENCOUNTER — Ambulatory Visit (HOSPITAL_COMMUNITY)
Admission: RE | Admit: 2018-10-14 | Discharge: 2018-10-14 | Disposition: A | Discharge status: Home / Self Care | Payer: Medicare Other | Source: Ambulatory Visit | Attending: Nurse Practitioner | Admitting: Nurse Practitioner

## 2018-10-14 ENCOUNTER — Ambulatory Visit (HOSPITAL_COMMUNITY): Payer: Medicare Other | Admitting: Hematology

## 2018-10-14 ENCOUNTER — Other Ambulatory Visit: Payer: Self-pay

## 2018-10-14 DIAGNOSIS — R918 Other nonspecific abnormal finding of lung field: Secondary | ICD-10-CM | POA: Insufficient documentation

## 2018-10-14 NOTE — Progress Notes (Signed)
Per Dr. Vladimir Crofts go ahead and do without, pt showed up to appointment at [facility]

## 2018-10-20 ENCOUNTER — Encounter (HOSPITAL_COMMUNITY): Payer: Self-pay | Admitting: Hematology

## 2018-10-20 ENCOUNTER — Inpatient Hospital Stay (HOSPITAL_COMMUNITY): Payer: Medicare Other | Attending: Hematology | Admitting: Hematology

## 2018-10-20 ENCOUNTER — Other Ambulatory Visit: Payer: Self-pay

## 2018-10-20 VITALS — BP 100/61 | HR 73 | Temp 97.8°F | Resp 16 | Wt 109.2 lb

## 2018-10-20 DIAGNOSIS — K5909 Other constipation: Secondary | ICD-10-CM | POA: Insufficient documentation

## 2018-10-20 DIAGNOSIS — Z85820 Personal history of malignant melanoma of skin: Secondary | ICD-10-CM | POA: Insufficient documentation

## 2018-10-20 DIAGNOSIS — F17210 Nicotine dependence, cigarettes, uncomplicated: Secondary | ICD-10-CM | POA: Diagnosis not present

## 2018-10-20 DIAGNOSIS — R918 Other nonspecific abnormal finding of lung field: Secondary | ICD-10-CM | POA: Diagnosis not present

## 2018-10-20 DIAGNOSIS — Z853 Personal history of malignant neoplasm of breast: Secondary | ICD-10-CM | POA: Diagnosis not present

## 2018-10-20 DIAGNOSIS — Z809 Family history of malignant neoplasm, unspecified: Secondary | ICD-10-CM | POA: Insufficient documentation

## 2018-10-20 DIAGNOSIS — Z9012 Acquired absence of left breast and nipple: Secondary | ICD-10-CM | POA: Diagnosis not present

## 2018-10-20 DIAGNOSIS — Z801 Family history of malignant neoplasm of trachea, bronchus and lung: Secondary | ICD-10-CM | POA: Insufficient documentation

## 2018-10-20 DIAGNOSIS — Z9221 Personal history of antineoplastic chemotherapy: Secondary | ICD-10-CM | POA: Diagnosis not present

## 2018-10-20 DIAGNOSIS — C50912 Malignant neoplasm of unspecified site of left female breast: Secondary | ICD-10-CM

## 2018-10-20 DIAGNOSIS — J439 Emphysema, unspecified: Secondary | ICD-10-CM | POA: Diagnosis not present

## 2018-10-20 DIAGNOSIS — D473 Essential (hemorrhagic) thrombocythemia: Secondary | ICD-10-CM | POA: Diagnosis present

## 2018-10-20 MED ORDER — CLONAZEPAM 0.5 MG PO TABS: 0.5000 mg | ORAL_TABLET | Freq: Every day | ORAL | 2 refills | Status: DC

## 2018-10-20 MED ORDER — HYDROXYUREA 500 MG PO CAPS: ORAL_CAPSULE | ORAL | 4 refills | Status: DC

## 2018-10-20 NOTE — Assessment & Plan Note (Signed)
1.  Essential thrombocytosis: - She is taking hydroxyurea 500 mg Tuesday through Friday and thousand milligrams on Saturday, Sunday and Monday. -She is tolerating it very well.  Denies any vasomotor symptoms. -We reviewed her blood work which showed platelets below 400 and hematocrit below 45. - She will continue her current regimen.  We will see her back in 4 months for follow-up.  2.  Lung nodules: - We reviewed results of the CT chest without contrast dated 10/14/2018.  Enlarging right upper lobe spiculated pulmonary nodule, measuring 12 x 9 mm, previously 10 x 5 mm.  Newly diagnosed pleural-based nodule measuring 17 x 7 mm.  Some new small left upper lobe lung nodules were also seen. -She denies any recent infections.  She is continuing to smoke about half pack per day. -Based on these new findings I have recommended doing a PET CT scan.  We will see her back after the PET scan.  3.  Left breast cancer: -Diagnosed in 1995 and treated with chemotherapy. - Mammogram on 09/10/2018 of the right breast was BI-RADS Category 1.  4.  Melanoma: -She had melanoma resected from left upper thigh in 2015. -She follows up with her dermatologist Dr. Nevada Crane.  No clinical evidence of recurrence at this time.

## 2018-10-20 NOTE — Progress Notes (Signed)
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 The 3 liver lesions are not hypermetabolic. The radiologist suspects they may be subtly present on prior CT chest from 10/2013, to further reassuring that these are likely benign lesions.   Melanoma ([facility])  07/09/2013 Initial Biopsy   Initial biopsy L upper thigh/buttocks melanoma   07/20/2013 Surgery   Excision L lateral buttock/upper thigh melanoma, clear margins   07/20/2013 Pathology Results   Breslow depth 1.02 mm, pT2a       INTERVAL HISTORY:  Ms. Donnell 68 y.o. female seen for follow-up of myeloproliferative disorder as well as lung nodules.  Denies any recent lung infections or pneumonia.  No change in baseline cough.  She is smoking about half pack of cigarettes per day.  Energy levels are 50%.  Appetite is 100%.  Denies any fevers, night sweats or weight loss in the last 3 to 6 months.  She is taking hydroxyurea 1 tablet daily 4 days a week and 2 tablets daily 3 days a week.  Denies any vasomotor symptoms.  Denies any skin ulcers in the lower extremities.  No ER visits or hospitalizations.  She requests refill for hydroxyurea and  clonazepam.  REVIEW OF SYSTEMS:  Review of Systems  Respiratory: Positive for shortness of breath.   Cardiovascular: Positive for leg swelling.  Gastrointestinal: Positive for constipation.  Neurological: Positive for numbness.  All other systems reviewed and are negative.    PAST MEDICAL/SURGICAL HISTORY:  Past Medical History:  Diagnosis Date   Adenocarcinoma of left breast ([facility]) 01/09/2016   Arthritis    Ascending aortic aneurysm (HCC)    Cancer ([facility]) 1995   breast, left, mastectomy/chemo   Chest pain    Possibly cardiac. No evidence of ischemia/injury based upon normal troponin I. Chest discomfort could be tachycardia induced supply demand mismatch.    CHF (congestive heart failure) ([facility]) 11/17/2015   after surgery    Colon adenomas    Coronary artery disease    Dyspnea    with exertion   Emphysema of lung ([facility]) 11/17/2015   Essential hypertension, benign    GERD (gastroesophageal reflux disease)    Hyperlipidemia    Hypertension    Melanoma ([facility]) 01/09/2016   Palpitations    Pernicious anemia 03/06/2016   Pure hypercholesterolemia    Thrombocythemia, essential ([facility]) 01/09/2016   Thrombocytosis ([facility])    Idiopathic   Past Surgical History:  Procedure Laterality Date   ANTERIOR AND POSTERIOR REPAIR N/A 12/09/2014   Procedure: ANTERIOR (CYSTOCELE) AND POSTERIOR REPAIR (RECTOCELE);  Surgeon: Bjorn Loser, MD;  Location: [facility];  Service: Urology;  Laterality: N/A;   ANTERIOR CERVICAL DECOMPRESSION/DISCECTOMY FUSION 4 LEVELS  Right 10/03/2016   Procedure: ANTERIOR CERVICAL DECOMPRESSION FUSION, CERVICAL 4-5, CERVICAL 5-6, CERVICAL 6-7, CERVICAL 7 TO THORACIC 1 WITH INSTRUMENTATION AND ALLOGRAFT;  Surgeon: Phylliss Bob, MD;  Location: [facility];  Service: Orthopedics;  Laterality: Right;  ANTERIOR CERVICAL DECOMPRESSION FUSION, CERVICAL 4-5, CERVICAL 5-6, CERVICAL 6-7, CERVICAL 7 TO THORACIC 1 WITH INSTRUMENTATION AND ALLOGRAFT; REQUEST 4 HO   ANTERIOR  LAT LUMBAR FUSION Left 11/16/2015   Procedure: LEFT SIDED LATERAL INTERBODY FUSION, LUMBAR 2-3, LUMBAR 3-4, LUMBAR 4-5 WITH INSTRUMENTATION;  Surgeon: Phylliss Bob, MD;  Location: [facility];  Service: Orthopedics;  Laterality: Left;  LEFT SIDED LATEARL INTERBODY FUSION, LUMBAR 2-3, LUMBAR 3-4, LUMBAR 4-5 WITH INSTRUMENTATION    APPENDECTOMY     BONE MARROW ASPIRATION  07/2012   BONE MARROW BIOPSY  07/2012   BREAST SURGERY     CARDIAC CATHETERIZATION     CARDIAC CATHETERIZATION N/A 01/20/2016   Procedure: Left Heart Cath and Coronary Angiography;  Surgeon: Burnell Blanks, MD;  Location: [facility];  Service: Cardiovascular;  Laterality: N/A;   COLONOSCOPY  11/29/2010   Procedure: COLONOSCOPY;  Surgeon: Rogene Houston, MD;  [facility];  Service: Endoscopy;  Laterality: N/A;   COLONOSCOPY N/A 02/18/2014   Procedure: COLONOSCOPY;  Surgeon: Rogene Houston, MD;  [facility];  Service: Endoscopy;  Laterality: N/A;  1030   COLONOSCOPY N/A 02/25/2017   Procedure: COLONOSCOPY;  Surgeon: Rogene Houston, MD;  [facility];  Service: Endoscopy;  Laterality: N/A;  10:55   CYSTOSCOPY N/A 12/09/2014   Procedure: CYSTOSCOPY;  Surgeon: Bjorn Loser, MD;  Location: [facility];  Service: Urology;  Laterality: N/A;   ESOPHAGEAL DILATION N/A 02/25/2017   Procedure: ESOPHAGEAL DILATION;  Surgeon: Rogene Houston, MD;  [facility];  Service: Endoscopy;  Laterality: N/A;   ESOPHAGOGASTRODUODENOSCOPY N/A 02/25/2017   Procedure: ESOPHAGOGASTRODUODENOSCOPY (EGD);  Surgeon: Rogene Houston, MD;  [facility];  Service: Endoscopy;  Laterality: N/A;   IR GENERIC HISTORICAL  01/11/2016   IR RADIOLOGY PERIPHERAL GUIDED IV START 01/11/2016 Saverio Danker, PA-C [facility]   IR GENERIC HISTORICAL  01/11/2016   IR US GUIDE VASC ACCESS RIGHT 01/11/2016 Saverio Danker, PA-C [facility]   MASTECTOMY     left   OVARIAN CYST SURGERY     x2    POLYPECTOMY  02/25/2017   Procedure: POLYPECTOMY;  Surgeon: Rogene Houston, MD;  [facility];  Service: Endoscopy;;   SALPINGOOPHORECTOMY Bilateral 12/09/2014   Procedure: SALPINGO OOPHORECTOMY;  Surgeon: Servando Salina, MD;  Location: [facility];  Service: Gynecology;  Laterality: Bilateral;   TUBAL LIGATION     VAGINAL HYSTERECTOMY N/A 12/09/2014   Procedure: HYSTERECTOMY VAGINAL;  Surgeon: Servando Salina, MD;  Location: [facility];  Service: Gynecology;  Laterality: N/A;     SOCIAL HISTORY:  Social History   Socioeconomic History   Marital status: Divorced    Spouse name: Not on file   Number of children: 2   Years of education: Not on file   Highest education level: Not on file  Occupational History   Not on file  Social Needs   Financial resource strain: Not on file   Food insecurity    Worry: Not on file    Inability: Not on file   Transportation needs    Medical: Not on file    Non-medical: Not on file  Tobacco Use   Smoking status: Current Every Day Smoker  Packs/day: 0.50    Years: 50.00    Pack years: 25.00    Types: Cigarettes   Smokeless tobacco: Never Used  Substance and Sexual Activity   Alcohol use: No   Drug use: No   Sexual activity: Yes    Birth control/protection: Post-menopausal  Lifestyle   Physical activity    Days per week: Not on file    Minutes per session: Not on file   Stress: Not on file  Relationships   Social connections    Talks on phone: Not on file    Gets together: Not on file    Attends religious service: Not on file    Active member of club or organization: Not on file    Attends meetings of clubs or organizations: Not on file    Relationship status: Not on file   Intimate partner violence    Fear of current or ex partner: Not on file    Emotionally abused: Not on file    Physically abused: Not on file    Forced sexual activity: Not on file  Other Topics Concern   Not on file  Social  History Narrative   Not on file    FAMILY HISTORY:  Family History  Problem Relation Age of Onset   Hypertension Mother    Heart failure Mother    Congestive Heart Failure Mother    COPD Mother    Pernicious anemia Mother    Cancer Mother        lung   Hypertension Father    CAD Father    Heart attack Father    Hypertension Sister    Cancer Other    Celiac disease Other     CURRENT MEDICATIONS:  Outpatient Encounter Medications as of 10/20/2018  Medication Sig   albuterol (PROVENTIL HFA;VENTOLIN HFA) 108 (90 Base) MCG/ACT inhaler Inhale 2 puffs into the lungs every 6 (six) hours as needed for wheezing or shortness of breath.   amLODipine (NORVASC) 2.5 MG tablet Take 2.5 mg by mouth daily.    aspirin EC 81 MG tablet Take 81 mg by mouth daily.   Calcium Carb-Cholecalciferol (CALCIUM 600+D) 600-800 MG-UNIT TABS Take 1 tablet by mouth 2 (two) times daily.   Cholecalciferol (VITAMIN D-3) 1000 units CAPS Take 1,000 Units daily by mouth.    clonazePAM (KLONOPIN) 0.5 MG tablet Take 1 tablet (0.5 mg total) by mouth at bedtime.   cyanocobalamin (,VITAMIN B-12,) 1000 MCG/ML injection INJECT 1 ML INTO THE MUSCLE ONCE MONTHLY AS DIRECTED.   furosemide (LASIX) 40 MG tablet Take 40 mg by mouth daily. Can go up to 80 mg daily per pt   gabapentin (NEURONTIN) 300 MG capsule Take 300 mg by mouth at bedtime.   hydroxyurea (HYDREA) 500 MG capsule TAKE 1 CAPSULE DAILY TUESDAY-FRIDAY AND 2 CAPSULES ON SATURDAY-MONDAY.   ibandronate (BONIVA) 150 MG tablet Take 1 tablet by mouth every 30 (thirty) days.   ibuprofen (ADVIL,MOTRIN) 800 MG tablet Take 800 mg every 8 (eight) hours as needed by mouth for moderate pain.   ipratropium-albuterol (DUONEB) 0.5-2.5 (3) MG/3ML SOLN Take 3 mLs by nebulization every 6 (six) hours as needed (shortness of breath).   methocarbamol (ROBAXIN) 500 MG tablet Take 500 mg daily as needed by mouth for muscle spasms.    nitroGLYCERIN (NITRODUR - DOSED  IN MG/24 HR) 0.1 mg/hr patch Place 0.1 mg onto the skin daily.    ondansetron (ZOFRAN) 8 MG tablet Take 1 tablet (8 mg total) by mouth every 8 (  eight) hours as needed for nausea or vomiting.   pantoprazole (PROTONIX) 40 MG tablet Take 40 mg daily by mouth.   potassium chloride SA (K-DUR,KLOR-CON) 20 MEQ tablet Take 1 tablet (20 mEq total) by mouth 3 (three) times daily.   rosuvastatin (CRESTOR) 40 MG tablet Take 40 mg at bedtime by mouth.    traMADol (ULTRAM) 50 MG tablet Take 50 mg by mouth as needed.    [DISCONTINUED] clonazePAM (KLONOPIN) 0.5 MG tablet TAKE ONE TABLET BY MOUTH AT BEDTIME.   [DISCONTINUED] hydroxyurea (HYDREA) 500 MG capsule TAKE 1 CAPSULE DAILY TUESDAY-FRIDAY AND 2 CAPSULES ON SATURDAY-MONDAY.   [DISCONTINUED] methocarbamol (ROBAXIN) 500 MG tablet Take by mouth.   No facility-administered encounter medications on file as of 10/20/2018.     ALLERGIES:  Allergies  Allergen Reactions   Penicillins Hives and Rash    Has patient had a PCN reaction causing immediate rash, facial/tongue/throat swelling, SOB or lightheadedness with hypotension: Yes Has patient had a PCN reaction causing severe rash involving mucus membranes or skin necrosis: Yes Has patient had a PCN reaction that required hospitalization No Has patient had a PCN reaction occurring within the last 10 years: Yes If all of the above answers are "NO", then may proceed with Cephalosporin use.    Tape Rash    Medipore, Coban, and paper tape CAN be tolerated     PHYSICAL EXAM:  ECOG Performance status: 1  Vitals:   10/20/18 1608  BP: 100/61  Pulse: 73  Resp: 16  Temp: 97.8 F (36.6 C)  SpO2: 98%   Filed Weights   10/20/18 1608  Weight: 109 lb 3.2 oz (49.5 kg)    Physical Exam Constitutional:      Appearance: Normal appearance. She is normal weight.  Cardiovascular:     Rate and Rhythm: Normal rate and regular rhythm.     Heart sounds: Normal heart sounds.  Pulmonary:     Effort:  Pulmonary effort is normal.     Breath sounds: Normal breath sounds.  Abdominal:     General: Abdomen is flat. There is no distension.     Palpations: Abdomen is soft. There is no mass.  Musculoskeletal: Normal range of motion.  Skin:    General: Skin is warm and dry.  Neurological:     Mental Status: She is alert and oriented to person, place, and time. Mental status is at baseline.  Psychiatric:        Mood and Affect: Mood normal.        Behavior: Behavior normal.        Thought Content: Thought content normal.        Judgment: Judgment normal.    No palpable splenomegaly.  No leg ulcers.  LABORATORY DATA:  I have reviewed the labs as listed.  CBC    Component Value Date/Time   WBC 6.5 10/07/2018 1116   RBC 3.03 (L) 10/07/2018 1116   HGB 11.3 (L) 10/07/2018 1116   HCT 34.3 (L) 10/07/2018 1116   PLT 390 10/07/2018 1116   MCV 113.2 (H) 10/07/2018 1116   MCH 37.3 (H) 10/07/2018 1116   MCHC 32.9 10/07/2018 1116   RDW 16.1 (H) 10/07/2018 1116   LYMPHSABS 1.5 10/07/2018 1116   MONOABS 0.6 10/07/2018 1116   EOSABS 0.3 10/07/2018 1116   BASOSABS 0.0 10/07/2018 1116   CMP Latest Ref Rng & Units 10/07/2018 10/07/2018 06/17/2018  Glucose 70 - 99 mg/dL - 99 158(H)  BUN 8 - 23 mg/dL - 25(H) 20  Creatinine 0.44 - 1.00 mg/dL 1.90(H) 1.27(H) 0.95  Sodium 135 - 145 mmol/L - 137 137  Potassium 3.5 - 5.1 mmol/L - 3.9 3.5  Chloride 98 - 111 mmol/L - 100 105  CO2 22 - 32 mmol/L - 26 24  Calcium 8.9 - 10.3 mg/dL - 9.2 9.0  Total Protein 6.5 - 8.1 g/dL - 7.5 7.3  Total Bilirubin 0.3 - 1.2 mg/dL - 0.5 0.5  Alkaline Phos 38 - 126 U/L - 65 57  AST 15 - 41 U/L - 24 23  ALT 0 - 44 U/L - 15 12       DIAGNOSTIC IMAGING:  I have independently reviewed the scans and discussed with the patient.     ASSESSMENT & PLAN:   Thrombocythemia, essential ([facility]) 1.  Essential thrombocytosis: - She is taking hydroxyurea 500 mg Tuesday through Friday and thousand milligrams on Saturday, Sunday  and Monday. -She is tolerating it very well.  Denies any vasomotor symptoms. -We reviewed her blood work which showed platelets below 400 and hematocrit below 45. - She will continue her current regimen.  We will see her back in 4 months for follow-up.  2.  Lung nodules: - We reviewed results of the CT chest without contrast dated 10/14/2018.  Enlarging right upper lobe spiculated pulmonary nodule, measuring 12 x 9 mm, previously 10 x 5 mm.  Newly diagnosed pleural-based nodule measuring 17 x 7 mm.  Some new small left upper lobe lung nodules were also seen. -She denies any recent infections.  She is continuing to smoke about half pack per day. -Based on these new findings I have recommended doing a PET CT scan.  We will see her back after the PET scan.  3.  Left breast cancer: -Diagnosed in 1995 and treated with chemotherapy. - Mammogram on 09/10/2018 of the right breast was BI-RADS Category 1.  4.  Melanoma: -She had melanoma resected from left upper thigh in 2015. -She follows up with her dermatologist Dr. Nevada Crane.  No clinical evidence of recurrence at this time.  Total time spent is 25 minutes with more than 50% of the time spent face-to-face discussing scan results, counseling and coordination of care.    Orders placed this encounter:  Orders Placed This Encounter  Procedures   NM PET Image Restag (PS) Skull Base To Thigh      Derek Jack, MD [place] [phone]

## 2018-10-20 NOTE — Patient Instructions (Addendum)
[facility] at [facility] Discharge Instructions  You were seen today by Dr. Delton Coombes. He went over your recent lab and scan results. He will see you back after scan for follow up.   Thank you for choosing [facility] at [facility] to provide your oncology and hematology care.  To afford each patient quality time with our provider, please arrive at least 15 minutes before your scheduled appointment time.   If you have a lab appointment with the [facility] please come in thru the  Main Entrance and check in at the main information desk  You need to re-schedule your appointment should you arrive 10 or more minutes late.  We strive to give you quality time with our providers, and arriving late affects you and other patients whose appointments are after yours.  Also, if you no show three or more times for appointments you may be dismissed from the clinic at the providers discretion.     Again, thank you for choosing [facility].  Our hope is that these requests will decrease the amount of time that you wait before being seen by our physicians.       _____________________________________________________________  Should you have questions after your visit to [facility], please contact our office at [phone] between the hours of 8:00 a.m. and 4:30 p.m.  Voicemails left after 4:00 p.m. will not be returned until the following business day.  For prescription refill requests, have your pharmacy contact our office and allow 72 hours.    Cancer Center Support Programs:   > Cancer Support Group  2nd Tuesday of the month 1pm-2pm, Journey Room

## 2018-10-24 ENCOUNTER — Encounter (HOSPITAL_COMMUNITY)
Admission: RE | Admit: 2018-10-24 | Discharge: 2018-10-24 | Disposition: A | Discharge status: Home / Self Care | Payer: Medicare Other | Source: Ambulatory Visit | Attending: Hematology | Admitting: Hematology

## 2018-10-24 ENCOUNTER — Other Ambulatory Visit: Payer: Self-pay

## 2018-10-24 DIAGNOSIS — J439 Emphysema, unspecified: Secondary | ICD-10-CM | POA: Diagnosis not present

## 2018-10-24 DIAGNOSIS — I2510 Atherosclerotic heart disease of native coronary artery without angina pectoris: Secondary | ICD-10-CM | POA: Diagnosis not present

## 2018-10-24 DIAGNOSIS — R918 Other nonspecific abnormal finding of lung field: Secondary | ICD-10-CM | POA: Insufficient documentation

## 2018-10-24 DIAGNOSIS — I712 Thoracic aortic aneurysm, without rupture: Secondary | ICD-10-CM | POA: Diagnosis not present

## 2018-10-24 LAB — GLUCOSE, CAPILLARY: Glucose-Capillary: 137 mg/dL — ABNORMAL HIGH (ref 70–99)

## 2018-10-24 MED ORDER — FLUDEOXYGLUCOSE F - 18 (FDG) INJECTION
5.4500 | Freq: Once | INTRAVENOUS | Status: AC | PRN
  Administered 2018-10-24: 5.45 via INTRAVENOUS

## 2018-10-28 ENCOUNTER — Other Ambulatory Visit: Payer: Self-pay

## 2018-10-28 ENCOUNTER — Inpatient Hospital Stay (HOSPITAL_BASED_OUTPATIENT_CLINIC_OR_DEPARTMENT_OTHER): Payer: Medicare Other | Admitting: Hematology

## 2018-10-28 ENCOUNTER — Encounter (HOSPITAL_COMMUNITY): Payer: Self-pay | Admitting: Hematology

## 2018-10-28 VITALS — BP 122/73 | HR 72 | Temp 97.3°F | Resp 18 | Wt 109.2 lb

## 2018-10-28 DIAGNOSIS — D473 Essential (hemorrhagic) thrombocythemia: Secondary | ICD-10-CM | POA: Diagnosis not present

## 2018-10-28 DIAGNOSIS — J439 Emphysema, unspecified: Secondary | ICD-10-CM

## 2018-10-28 DIAGNOSIS — R918 Other nonspecific abnormal finding of lung field: Secondary | ICD-10-CM

## 2018-10-28 DIAGNOSIS — Z853 Personal history of malignant neoplasm of breast: Secondary | ICD-10-CM

## 2018-10-28 DIAGNOSIS — F17210 Nicotine dependence, cigarettes, uncomplicated: Secondary | ICD-10-CM | POA: Diagnosis not present

## 2018-10-28 DIAGNOSIS — Z801 Family history of malignant neoplasm of trachea, bronchus and lung: Secondary | ICD-10-CM

## 2018-10-28 DIAGNOSIS — Z809 Family history of malignant neoplasm, unspecified: Secondary | ICD-10-CM

## 2018-10-28 DIAGNOSIS — Z85820 Personal history of malignant melanoma of skin: Secondary | ICD-10-CM

## 2018-10-28 DIAGNOSIS — Z9221 Personal history of antineoplastic chemotherapy: Secondary | ICD-10-CM

## 2018-10-28 DIAGNOSIS — K5909 Other constipation: Secondary | ICD-10-CM | POA: Diagnosis not present

## 2018-10-28 DIAGNOSIS — Z9012 Acquired absence of left breast and nipple: Secondary | ICD-10-CM

## 2018-10-28 NOTE — Progress Notes (Signed)
 [facility] [address] [place], [place] [address]   CLINIC:  Medical Oncology/Hematology  PCP:  Strader, Lindsey F, NP [address] [place] [address] [phone]   REASON FOR VISIT: Follow-up for adenocarcinoma of the left breast AND thrombocytopenia  CURRENT THERAPY:Observation AND Hydrea  BRIEF ONCOLOGIC HISTORY:  Oncology History  Adenocarcinoma of left breast (HCC)  09/29/1993 - 05/14/1994 Chemotherapy    AC Q 3 weeks X 6 cycles   01/02/1994 Surgery   Left modified radical mastectomy   01/02/1994 Pathology Results   ER-, PR - with a single positive LN found in the L axillae, Stage II disease   07/22/2015 Imaging   Bone Scan, No metastatic pattern uptake, degenerative changes in the lumbar spine with dextroscoliosis   05/25/2016 PET scan   No findings of active malignancy in the neck, chest, abdomen, or pelvis. The 3 liver lesions are not hypermetabolic. The radiologist suspects they may be subtly present on prior CT chest from 10/2013, to further reassuring that these are likely benign lesions.   Melanoma (HCC)  07/09/2013 Initial Biopsy   Initial biopsy L upper thigh/buttocks melanoma   07/20/2013 Surgery   Excision L lateral buttock/upper thigh melanoma, clear margins   07/20/2013 Pathology Results   Breslow depth 1.02 mm, pT2a       INTERVAL HISTORY:  Ashley [name] 68 y.o. female seen for follow-up of lung nodules.  She is continuing to smoke half pack per day.  She had a PET scan done for the follow-up of lung nodules.  Denies any change in cough or hemoptysis.  Denies any weight loss.  No chest wall pain was reported.  No headaches or vision changes.  Chronic constipation and shortness of breath on exertion is stable.  REVIEW OF SYSTEMS:  Review of Systems  Constitutional: Positive for fatigue.  Respiratory: Positive for cough and shortness of breath.   Gastrointestinal: Positive for constipation.  All other systems reviewed and are negative.     PAST MEDICAL/SURGICAL HISTORY:  Past Medical History:  Diagnosis Date  . Adenocarcinoma of left breast (HCC) 01/09/2016  . Arthritis   . Ascending aortic aneurysm (HCC)   . Cancer (HCC) 1995   breast, left, mastectomy/chemo  . Chest pain    Possibly cardiac. No evidence of ischemia/injury based upon normal troponin I. Chest discomfort could be tachycardia induced supply demand mismatch.   . CHF (congestive heart failure) (HCC) 11/17/2015   after surgery   . Colon adenomas   . Coronary artery disease   . Dyspnea    with exertion  . Emphysema of lung (HCC) 11/17/2015  . Essential hypertension, benign   . GERD (gastroesophageal reflux disease)   . Hyperlipidemia   . Hypertension   . Melanoma (HCC) 01/09/2016  . Palpitations   . Pernicious anemia 03/06/2016  . Pure hypercholesterolemia   . Thrombocythemia, essential (HCC) 01/09/2016  . Thrombocytosis (HCC)    Idiopathic   Past Surgical History:  Procedure Laterality Date  . ANTERIOR AND POSTERIOR REPAIR N/A 12/09/2014   Procedure: ANTERIOR (CYSTOCELE) AND POSTERIOR REPAIR (RECTOCELE);  Surgeon: Scott MacDiarmid, MD;  Location: [facility];  Service: Urology;  Laterality: N/A;  . ANTERIOR CERVICAL DECOMPRESSION/DISCECTOMY FUSION 4 LEVELS Right 10/03/2016   Procedure: ANTERIOR CERVICAL DECOMPRESSION FUSION, CERVICAL 4-5, CERVICAL 5-6, CERVICAL 6-7, CERVICAL 7 TO THORACIC 1 WITH INSTRUMENTATION AND ALLOGRAFT;  Surgeon: Dumonski, Mark, MD;  Location: [facility] OR;  Service: Orthopedics;  Laterality: Right;  ANTERIOR CERVICAL DECOMPRESSION FUSION, CERVICAL 4-5, CERVICAL 5-6,   CERVICAL 6-7, CERVICAL 7 TO THORACIC 1 WITH INSTRUMENTATION AND ALLOGRAFT; REQUEST 4 HO  . ANTERIOR LAT LUMBAR FUSION Left 11/16/2015   Procedure: LEFT SIDED LATERAL INTERBODY FUSION, LUMBAR 2-3, LUMBAR 3-4, LUMBAR 4-5 WITH INSTRUMENTATION;  Surgeon: Mark Dumonski, MD;  Location: [facility] OR;  Service: Orthopedics;  Laterality: Left;  LEFT SIDED LATEARL INTERBODY FUSION, LUMBAR 2-3, LUMBAR  3-4, LUMBAR 4-5 WITH INSTRUMENTATION   . APPENDECTOMY    . BONE MARROW ASPIRATION  07/2012  . BONE MARROW BIOPSY  07/2012  . BREAST SURGERY    . CARDIAC CATHETERIZATION    . CARDIAC CATHETERIZATION N/A 01/20/2016   Procedure: Left Heart Cath and Coronary Angiography;  Surgeon: Christopher D McAlhany, MD;  Location: [facility];  Service: Cardiovascular;  Laterality: N/A;  . COLONOSCOPY  11/29/2010   Procedure: COLONOSCOPY;  Surgeon: Najeeb U Rehman, MD;  Location: [facility];  Service: Endoscopy;  Laterality: N/A;  . COLONOSCOPY N/A 02/18/2014   Procedure: COLONOSCOPY;  Surgeon: Najeeb U Rehman, MD;  [facility]: [facility];  Service: Endoscopy;  Laterality: N/A;  1030  . COLONOSCOPY N/A 02/25/2017   Procedure: COLONOSCOPY;  Surgeon: Rehman, Najeeb U, MD;  [facility]: [facility];  Service: Endoscopy;  Laterality: N/A;  10:55  . CYSTOSCOPY N/A 12/09/2014   Procedure: CYSTOSCOPY;  Surgeon: Scott MacDiarmid, MD;  Location: [facility];  Service: Urology;  Laterality: N/A;  . ESOPHAGEAL DILATION N/A 02/25/2017   Procedure: ESOPHAGEAL DILATION;  Surgeon: Rehman, Najeeb U, MD;  [facility]: [facility];  Service: Endoscopy;  Laterality: N/A;  . ESOPHAGOGASTRODUODENOSCOPY N/A 02/25/2017   Procedure: ESOPHAGOGASTRODUODENOSCOPY (EGD);  Surgeon: Rehman, Najeeb U, MD;  [facility]: [facility];  Service: Endoscopy;  Laterality: N/A;  . IR GENERIC HISTORICAL  01/11/2016   IR RADIOLOGY PERIPHERAL GUIDED IV START 01/11/2016 Kelly Osborne, PA-C [facility]  . IR GENERIC HISTORICAL  01/11/2016   IR US GUIDE VASC ACCESS RIGHT 01/11/2016 Kelly Osborne, PA-C [facility]  . MASTECTOMY     left  . OVARIAN CYST SURGERY     x2  . POLYPECTOMY  02/25/2017   Procedure: POLYPECTOMY;  Surgeon: Rehman, Najeeb U, MD;  [facility]: [facility];  Service: Endoscopy;;  . SALPINGOOPHORECTOMY Bilateral 12/09/2014   Procedure: SALPINGO OOPHORECTOMY;  Surgeon: Sheronette Cousins, MD;  Location: [facility];  Service: Gynecology;   Laterality: Bilateral;  . TUBAL LIGATION    . VAGINAL HYSTERECTOMY N/A 12/09/2014   Procedure: HYSTERECTOMY VAGINAL;  Surgeon: Sheronette Cousins, MD;  Location: [facility];  Service: Gynecology;  Laterality: N/A;     SOCIAL HISTORY:  Social History   Socioeconomic History  . Marital status: Divorced    Spouse name: Not on file  . Number of children: 2  . Years of education: Not on file  . Highest education level: Not on file  Occupational History  . Not on file  Social Needs  . Financial resource strain: Not on file  . Food insecurity    Worry: Not on file    Inability: Not on file  . Transportation needs    Medical: Not on file    Non-medical: Not on file  Tobacco Use  . Smoking status: Current Every Day Smoker    Packs/day: 0.50    Years: 50.00    Pack years: 25.00    Types: Cigarettes  . Smokeless tobacco: Never Used  Substance and Sexual Activity  . Alcohol use: No  . Drug use: No  . Sexual activity: Yes    Birth   control/protection: Post-menopausal  Lifestyle  . Physical activity    Days per week: Not on file    Minutes per session: Not on file  . Stress: Not on file  Relationships  . Social connections    Talks on phone: Not on file    Gets together: Not on file    Attends religious service: Not on file    Active member of club or organization: Not on file    Attends meetings of clubs or organizations: Not on file    Relationship status: Not on file  . Intimate partner violence    Fear of current or ex partner: Not on file    Emotionally abused: Not on file    Physically abused: Not on file    Forced sexual activity: Not on file  Other Topics Concern  . Not on file  Social History Narrative  . Not on file    FAMILY HISTORY:  Family History  Problem Relation Age of Onset  . Hypertension Mother   . Heart failure Mother   . Congestive Heart Failure Mother   . COPD Mother   . Pernicious anemia Mother   . Cancer Mother        lung  . Hypertension  Father   . CAD Father   . Heart attack Father   . Hypertension Sister   . Cancer Other   . Celiac disease Other     CURRENT MEDICATIONS:  Outpatient Encounter Medications as of 10/28/2018  Medication Sig  . albuterol (PROVENTIL HFA;VENTOLIN HFA) 108 (90 Base) MCG/ACT inhaler Inhale 2 puffs into the lungs every 6 (six) hours as needed for wheezing or shortness of breath.  . amLODipine (NORVASC) 2.5 MG tablet Take 2.5 mg by mouth daily.   . aspirin EC 81 MG tablet Take 81 mg by mouth daily.  . Calcium Carb-Cholecalciferol (CALCIUM 600+D) 600-800 MG-UNIT TABS Take 1 tablet by mouth 2 (two) times daily.  . Cholecalciferol (VITAMIN D-3) 1000 units CAPS Take 1,000 Units daily by mouth.   . clonazePAM (KLONOPIN) 0.5 MG tablet Take 1 tablet (0.5 mg total) by mouth at bedtime.  . cyanocobalamin (,VITAMIN B-12,) 1000 MCG/ML injection INJECT 1 ML INTO THE MUSCLE ONCE MONTHLY AS DIRECTED.  . furosemide (LASIX) 40 MG tablet Take 40 mg by mouth daily. Can go up to 80 mg daily per pt  . gabapentin (NEURONTIN) 300 MG capsule Take 300 mg by mouth at bedtime.  . hydroxyurea (HYDREA) 500 MG capsule TAKE 1 CAPSULE DAILY TUESDAY-FRIDAY AND 2 CAPSULES ON SATURDAY-MONDAY.  . ibandronate (BONIVA) 150 MG tablet Take 1 tablet by mouth every 30 (thirty) days.  . ibuprofen (ADVIL,MOTRIN) 800 MG tablet Take 800 mg every 8 (eight) hours as needed by mouth for moderate pain.  . ipratropium-albuterol (DUONEB) 0.5-2.5 (3) MG/3ML SOLN Take 3 mLs by nebulization every 6 (six) hours as needed (shortness of breath).  . methocarbamol (ROBAXIN) 500 MG tablet Take 500 mg daily as needed by mouth for muscle spasms.   . nitroGLYCERIN (NITRODUR - DOSED IN MG/24 HR) 0.1 mg/hr patch Place 0.1 mg onto the skin daily.   . ondansetron (ZOFRAN) 8 MG tablet Take 1 tablet (8 mg total) by mouth every 8 (eight) hours as needed for nausea or vomiting.  . pantoprazole (PROTONIX) 40 MG tablet Take 40 mg daily by mouth.  . potassium chloride SA  (K-DUR,KLOR-CON) 20 MEQ tablet Take 1 tablet (20 mEq total) by mouth 3 (three) times daily.  . rosuvastatin (CRESTOR) 40 MG   tablet Take 40 mg at bedtime by mouth.   . traMADol (ULTRAM) 50 MG tablet Take 50 mg by mouth as needed.    No facility-administered encounter medications on file as of 10/28/2018.     ALLERGIES:  Allergies  Allergen Reactions  . Penicillins Hives and Rash    Has patient had a PCN reaction causing immediate rash, facial/tongue/throat swelling, SOB or lightheadedness with hypotension: Yes Has patient had a PCN reaction causing severe rash involving mucus membranes or skin necrosis: Yes Has patient had a PCN reaction that required hospitalization No Has patient had a PCN reaction occurring within the last 10 years: Yes If all of the above answers are "NO", then may proceed with Cephalosporin use.   . Tape Rash    Medipore, Coban, and paper tape CAN be tolerated     PHYSICAL EXAM:  ECOG Performance status: 1  Vitals:   10/28/18 1606  BP: 122/73  Pulse: 72  Resp: 18  Temp: (!) 97.3 F (36.3 C)  SpO2: 97%   Filed Weights   10/28/18 1606  Weight: 109 lb 3.2 oz (49.5 kg)    Physical Exam Constitutional:      Appearance: Normal appearance. She is normal weight.  Cardiovascular:     Rate and Rhythm: Normal rate and regular rhythm.     Heart sounds: Normal heart sounds.  Pulmonary:     Effort: Pulmonary effort is normal.     Breath sounds: Normal breath sounds.  Abdominal:     General: Abdomen is flat. There is no distension.     Palpations: Abdomen is soft. There is no mass.  Musculoskeletal: Normal range of motion.  Skin:    General: Skin is warm and dry.  Neurological:     Mental Status: She is alert and oriented to person, place, and time. Mental status is at baseline.  Psychiatric:        Mood and Affect: Mood normal.        Behavior: Behavior normal.        Thought Content: Thought content normal.        Judgment: Judgment normal.    No  palpable splenomegaly.  No leg ulcers.  LABORATORY DATA:  I have reviewed the labs as listed.  CBC    Component Value Date/Time   WBC 6.5 10/07/2018 1116   RBC 3.03 (L) 10/07/2018 1116   HGB 11.3 (L) 10/07/2018 1116   HCT 34.3 (L) 10/07/2018 1116   PLT 390 10/07/2018 1116   MCV 113.2 (H) 10/07/2018 1116   MCH 37.3 (H) 10/07/2018 1116   MCHC 32.9 10/07/2018 1116   RDW 16.1 (H) 10/07/2018 1116   LYMPHSABS 1.5 10/07/2018 1116   MONOABS 0.6 10/07/2018 1116   EOSABS 0.3 10/07/2018 1116   BASOSABS 0.0 10/07/2018 1116   CMP Latest Ref Rng & Units 10/07/2018 10/07/2018 06/17/2018  Glucose 70 - 99 mg/dL - 99 158(H)  BUN 8 - 23 mg/dL - 25(H) 20  Creatinine 0.44 - 1.00 mg/dL 1.90(H) 1.27(H) 0.95  Sodium 135 - 145 mmol/L - 137 137  Potassium 3.5 - 5.1 mmol/L - 3.9 3.5  Chloride 98 - 111 mmol/L - 100 105  CO2 22 - 32 mmol/L - 26 24  Calcium 8.9 - 10.3 mg/dL - 9.2 9.0  Total Protein 6.5 - 8.1 g/dL - 7.5 7.3  Total Bilirubin 0.3 - 1.2 mg/dL - 0.5 0.5  Alkaline Phos 38 - 126 U/L - 65 57  AST 15 - 41 U/L - 24   23  ALT 0 - 44 U/L - 15 12       DIAGNOSTIC IMAGING:  I have independently reviewed the scans and discussed with the patient.     ASSESSMENT & PLAN:   Multiple pulmonary nodules 1.  Essential thrombocytosis: - She takes hydroxyurea 5 mg Tuesday through Friday and thousand milligrams on Saturday, Sunday and Monday. -She is tolerating it very well.  Denies any vasomotor symptoms. -Recent labs showed platelet count below 400, hematocrit below 45. -She will continue current regimen.  We will see her back in 2 months for follow-up.   2.  Lung nodules: - CT of the chest with out contrast on 10/14/2018 showed enlarging right upper lobe spiculated pulmonary nodule and a new pleural-based nodule. -She is a current active smoker, smokes half pack per day. - We discussed results of PET scan dated 10/24/2018 which showed hypermetabolic right upper lobe pulmonary nodules suspicious for  synchronous primary bronchogenic carcinomas.  No other adenopathy was seen.  I have recommended her to see Dr. Roxan Hockey for possible resection. -We will order pulmonary function tests with bronchodilators.  3.  Left breast cancer: -Diagnosed in 1995 and treated with chemotherapy. - Mammogram on 09/10/2018 of the right breast was BI-RADS Category 1.  4.  Melanoma: -She had melanoma resected from left upper thigh in 2015. -She follows up with her dermatologist Dr. Nevada Crane.  No clinical evidence of recurrence at this time.  Total time spent is 25 minutes with more than 50% of the time spent face-to-face discussing scan results, counseling and coordination of care.    Orders placed this encounter:  Orders Placed This Encounter  Procedures  . Pulmonary Function Test      Derek Jack, MD [place] [phone]

## 2018-10-28 NOTE — Assessment & Plan Note (Signed)
1.  Essential thrombocytosis: - She takes hydroxyurea 5 mg Tuesday through Friday and thousand milligrams on Saturday, Sunday and Monday. -She is tolerating it very well.  Denies any vasomotor symptoms. -Recent labs showed platelet count below 400, hematocrit below 45. -She will continue current regimen.  We will see her back in 2 months for follow-up.   2.  Lung nodules: - CT of the chest with out contrast on 10/14/2018 showed enlarging right upper lobe spiculated pulmonary nodule and a new pleural-based nodule. -She is a current active smoker, smokes half pack per day. - We discussed results of PET scan dated 10/24/2018 which showed hypermetabolic right upper lobe pulmonary nodules suspicious for synchronous primary bronchogenic carcinomas.  No other adenopathy was seen.  I have recommended her to see Dr. Roxan Hockey for possible resection. -We will order pulmonary function tests with bronchodilators.  3.  Left breast cancer: -Diagnosed in 1995 and treated with chemotherapy. - Mammogram on 09/10/2018 of the right breast was BI-RADS Category 1.  4.  Melanoma: -She had melanoma resected from left upper thigh in 2015. -She follows up with her dermatologist Dr. Nevada Crane.  No clinical evidence of recurrence at this time.

## 2018-10-28 NOTE — Patient Instructions (Signed)
[place] at [facility] Discharge Instructions  You were seen today by Dr. Delton Coombes. He went over your recent scan results. He will schedule you for a pulmonary function test as well as a consult with Dr. Roxan Hockey. He will see you back in 2 months follow up.   Thank you for choosing [place] at [facility] to provide your oncology and hematology care.  To afford each patient quality time with our provider, please arrive at least 15 minutes before your scheduled appointment time.   If you have a lab appointment with the [facility] please come in thru the  Main Entrance and check in at the main information desk  You need to re-schedule your appointment should you arrive 10 or more minutes late.  We strive to give you quality time with our providers, and arriving late affects you and other patients whose appointments are after yours.  Also, if you no show three or more times for appointments you may be dismissed from the clinic at the providers discretion.     Again, thank you for choosing [facility].  Our hope is that these requests will decrease the amount of time that you wait before being seen by our physicians.       _____________________________________________________________  Should you have questions after your visit to [facility], please contact our office at [phone] between the hours of 8:00 a.m. and 4:30 p.m.  Voicemails left after 4:00 p.m. will not be returned until the following business day.  For prescription refill requests, have your pharmacy contact our office and allow 72 hours.    Cancer Center Support Programs:   > Cancer Support Group  2nd Tuesday of the month 1pm-2pm, Journey Room

## 2018-10-30 ENCOUNTER — Other Ambulatory Visit (HOSPITAL_COMMUNITY)
Admission: RE | Admit: 2018-10-30 | Discharge: 2018-10-30 | Disposition: A | Discharge status: Home / Self Care | Payer: Medicare Other | Source: Ambulatory Visit | Attending: Hematology | Admitting: Hematology

## 2018-10-30 ENCOUNTER — Other Ambulatory Visit: Payer: Self-pay

## 2018-10-30 DIAGNOSIS — Z1159 Encounter for screening for other viral diseases: Secondary | ICD-10-CM | POA: Diagnosis present

## 2018-10-30 LAB — SARS CORONAVIRUS 2 (TAT 6-24 HRS): SARS Coronavirus 2: NEGATIVE

## 2018-11-03 ENCOUNTER — Other Ambulatory Visit: Payer: Self-pay

## 2018-11-03 ENCOUNTER — Ambulatory Visit (HOSPITAL_COMMUNITY)
Admission: RE | Admit: 2018-11-03 | Discharge: 2018-11-03 | Disposition: A | Discharge status: Home / Self Care | Payer: Medicare Other | Source: Ambulatory Visit | Attending: Hematology | Admitting: Hematology

## 2018-11-03 DIAGNOSIS — R918 Other nonspecific abnormal finding of lung field: Secondary | ICD-10-CM | POA: Diagnosis present

## 2018-11-03 LAB — PULMONARY FUNCTION TEST
DL/VA % pred: 43 %
DL/VA: 1.86 ml/min/mmHg/L
DLCO unc % pred: 48 %
DLCO unc: 8.44 ml/min/mmHg
FEF 25-75 Post: 0.82 L/sec
FEF 25-75 Pre: 0.76 L/sec
FEF2575-%Change-Post: 7 %
FEF2575-%Pred-Post: 47 %
FEF2575-%Pred-Pre: 43 %
FEV1-%Change-Post: 4 %
FEV1-%Pred-Post: 86 %
FEV1-%Pred-Pre: 82 %
FEV1-Post: 1.71 L
FEV1-Pre: 1.64 L
FEV1FVC-%Change-Post: 2 %
FEV1FVC-%Pred-Pre: 76 %
FEV6-%Change-Post: 2 %
FEV6-%Pred-Post: 110 %
FEV6-%Pred-Pre: 107 %
FEV6-Post: 2.78 L
FEV6-Pre: 2.71 L
FEV6FVC-%Change-Post: 0 %
FEV6FVC-%Pred-Post: 101 %
FEV6FVC-%Pred-Pre: 100 %
FVC-%Change-Post: 1 %
FVC-%Pred-Post: 109 %
FVC-%Pred-Pre: 107 %
FVC-Post: 2.88 L
FVC-Pre: 2.83 L
Post FEV1/FVC ratio: 59 %
Post FEV6/FVC ratio: 97 %
Pre FEV1/FVC ratio: 58 %
Pre FEV6/FVC Ratio: 96 %
RV % pred: 130 %
RV: 2.67 L
TLC % pred: 117 %
TLC: 5.39 L

## 2018-11-03 MED ORDER — ALBUTEROL SULFATE (2.5 MG/3ML) 0.083% IN NEBU
2.5000 mg | INHALATION_SOLUTION | Freq: Once | RESPIRATORY_TRACT | Status: AC
  Administered 2018-11-03: 2.5 mg via RESPIRATORY_TRACT

## 2018-11-07 ENCOUNTER — Encounter: Payer: Self-pay | Admitting: Thoracic Surgery (Cardiothoracic Vascular Surgery)

## 2018-11-07 ENCOUNTER — Other Ambulatory Visit: Payer: Self-pay

## 2018-11-07 ENCOUNTER — Institutional Professional Consult (permissible substitution) (INDEPENDENT_AMBULATORY_CARE_PROVIDER_SITE_OTHER): Payer: Medicare Other | Admitting: Thoracic Surgery (Cardiothoracic Vascular Surgery)

## 2018-11-07 ENCOUNTER — Other Ambulatory Visit: Payer: Self-pay | Admitting: *Deleted

## 2018-11-07 VITALS — BP 112/71 | HR 87 | Temp 97.6°F | Resp 20 | Ht 62.0 in | Wt 110.0 lb

## 2018-11-07 DIAGNOSIS — J431 Panlobular emphysema: Secondary | ICD-10-CM

## 2018-11-07 DIAGNOSIS — I428 Other cardiomyopathies: Secondary | ICD-10-CM | POA: Diagnosis not present

## 2018-11-07 DIAGNOSIS — C50912 Malignant neoplasm of unspecified site of left female breast: Secondary | ICD-10-CM | POA: Diagnosis not present

## 2018-11-07 DIAGNOSIS — R918 Other nonspecific abnormal finding of lung field: Secondary | ICD-10-CM

## 2018-11-07 DIAGNOSIS — R911 Solitary pulmonary nodule: Secondary | ICD-10-CM

## 2018-11-07 NOTE — Progress Notes (Signed)
[address]       [place] [address]             [id number]                    Mauri W Klaiber [place] Medical Record #[id number] Date of Birth: 1951-02-08  Referring: Derek Jack, MD Primary Care: [name], NP Primary Cardiologist: No primary care provider on file.  Chief Complaint:   No chief complaint on file.   History of Present Illness:    Ashley [name] 68 y.o. female is seen in the office  today for evaluation of right upper lobe pulmonary nodules.  She has a history of breast adenocarcinoma that was treated in 1995 with a mastectomy, and no radiation.  She has a long history of dyspnea, and exertional chest pain.  Currently she can only walk about 29mn prior to having chest pain.  It is relieved with rest, but she has been given nitro patches by her cardiologist as well.  She continues to smoke, and has a chronic cough.  When she underwent spine surgery back in 2017, she developed respiratory failure.  She also lost 50 lbs, and has been unable to regain that weight.     Smoking Hx: still smoking 1/2ppd  Current Activity/ Functional Status:  Patient is independent with mobility/ambulation, transfers, ADL's, IADL's.   Zubrod Score: At the time of surgery this patients most appropriate activity status/level should be described as: '[]'     0    Normal activity, no symptoms '[x]'     1    Restricted in physical strenuous activity but ambulatory, able to do out light work '[]'     2    Ambulatory and capable of self care, unable to do work activities, up and about               >50 % of waking hours                              '[]'     3    Only limited self care, in bed greater than 50% of waking hours '[]'     4    Completely disabled, no self care, confined to bed or chair '[]'     5    Moribund   Past Medical History:  Diagnosis Date   Adenocarcinoma of left breast ([facility] 01/09/2016   Arthritis    Ascending aortic aneurysm ([facility]    [place]  [place] [place]   breast, left, mastectomy/chemo   Chest pain    Possibly cardiac. No evidence of ischemia/injury based upon normal troponin I. Chest discomfort could be tachycardia induced supply demand mismatch.    CHF (congestive heart failure) ([facility] 11/17/2015   after surgery    Colon adenomas    Coronary artery disease    Dyspnea    with exertion   Emphysema of lung ([facility] 11/17/2015   Essential hypertension, benign    GERD (gastroesophageal reflux disease)    Hyperlipidemia    Hypertension    Melanoma ([facility] 01/09/2016   Palpitations    Pernicious anemia 03/06/2016   Pure hypercholesterolemia    Thrombocythemia, essential ([facility] 01/09/2016   [place]    Idiopathic    Past Surgical History:  Procedure Laterality Date   ANTERIOR AND POSTERIOR REPAIR N/A 12/09/2014   Procedure: ANTERIOR (CYSTOCELE) AND POSTERIOR REPAIR (RECTOCELE);  Surgeon: SNicki Reaper  MacDiarmid, MD;  Location: [facility];  Service: Urology;  Laterality: N/A;   ANTERIOR CERVICAL DECOMPRESSION/DISCECTOMY FUSION 4 LEVELS Right 10/03/2016   Procedure: ANTERIOR CERVICAL DECOMPRESSION FUSION, CERVICAL 4-5, CERVICAL 5-6, CERVICAL 6-7, CERVICAL 7 TO THORACIC 1 WITH INSTRUMENTATION AND ALLOGRAFT;  Surgeon: Phylliss Bob, MD;  Location: [facility];  Service: Orthopedics;  Laterality: Right;  ANTERIOR CERVICAL DECOMPRESSION FUSION, CERVICAL 4-5, CERVICAL 5-6, CERVICAL 6-7, CERVICAL 7 TO THORACIC 1 WITH INSTRUMENTATION AND ALLOGRAFT; REQUEST 4 HO   ANTERIOR LAT LUMBAR FUSION Left 11/16/2015   Procedure: LEFT SIDED LATERAL INTERBODY FUSION, LUMBAR 2-3, LUMBAR 3-4, LUMBAR 4-5 WITH INSTRUMENTATION;  Surgeon: Phylliss Bob, MD;  Location: [facility];  Service: Orthopedics;  Laterality: Left;  LEFT SIDED LATEARL INTERBODY FUSION, LUMBAR 2-3, LUMBAR 3-4, LUMBAR 4-5 WITH INSTRUMENTATION    APPENDECTOMY     BONE MARROW ASPIRATION  07/2012   BONE MARROW BIOPSY  07/2012   BREAST SURGERY     CARDIAC CATHETERIZATION     CARDIAC  CATHETERIZATION N/A 01/20/2016   Procedure: Left Heart Cath and Coronary Angiography;  Surgeon: Burnell Blanks, MD;  Location: [facility];  Service: Cardiovascular;  Laterality: N/A;   COLONOSCOPY  11/29/2010   Procedure: COLONOSCOPY;  Surgeon: Rogene Houston, MD;  [facility];  Service: Endoscopy;  Laterality: N/A;   COLONOSCOPY N/A 02/18/2014   Procedure: COLONOSCOPY;  Surgeon: Rogene Houston, MD;  [facility];  Service: Endoscopy;  Laterality: N/A;  1030   COLONOSCOPY N/A 02/25/2017   Procedure: COLONOSCOPY;  Surgeon: Rogene Houston, MD;  [facility];  Service: Endoscopy;  Laterality: N/A;  10:55   CYSTOSCOPY N/A 12/09/2014   Procedure: CYSTOSCOPY;  Surgeon: Bjorn Loser, MD;  Location: [facility];  Service: Urology;  Laterality: N/A;   ESOPHAGEAL DILATION N/A 02/25/2017   Procedure: ESOPHAGEAL DILATION;  Surgeon: Rogene Houston, MD;  [facility];  Service: Endoscopy;  Laterality: N/A;   ESOPHAGOGASTRODUODENOSCOPY N/A 02/25/2017   Procedure: ESOPHAGOGASTRODUODENOSCOPY (EGD);  Surgeon: Rogene Houston, MD;  [facility];  Service: Endoscopy;  Laterality: N/A;   IR GENERIC HISTORICAL  01/11/2016   IR RADIOLOGY PERIPHERAL GUIDED IV START 01/11/2016 Saverio Danker, PA-C [facility]   IR GENERIC HISTORICAL  01/11/2016   IR US GUIDE VASC ACCESS RIGHT 01/11/2016 Saverio Danker, PA-C [facility]   MASTECTOMY     left   OVARIAN CYST SURGERY     x2   POLYPECTOMY  02/25/2017   Procedure: POLYPECTOMY;  Surgeon: Rogene Houston, MD;  [facility];  Service: Endoscopy;;   SALPINGOOPHORECTOMY Bilateral 12/09/2014   Procedure: SALPINGO OOPHORECTOMY;  Surgeon: Servando Salina, MD;  Location: [facility];  Service: Gynecology;  Laterality: Bilateral;   TUBAL LIGATION     VAGINAL HYSTERECTOMY N/A 12/09/2014   Procedure: HYSTERECTOMY VAGINAL;  Surgeon: Servando Salina, MD;  Location: [facility];  Service:  Gynecology;  Laterality: N/A;    Family History  Problem Relation Age of Onset   Hypertension Mother    Heart failure Mother    Congestive Heart Failure Mother    COPD Mother    Pernicious anemia Mother    Cancer Mother        lung   Hypertension Father    CAD Father    Heart attack Father    Hypertension Sister    Cancer Other    Celiac disease Other      Social History   Tobacco Use  Smoking Status  Current Every Day Smoker   Packs/day: 0.50   Years: 50.00   Pack years: 25.00   Types: Cigarettes  Smokeless Tobacco Never Used    Social History   Substance and Sexual Activity  Alcohol Use No     Allergies  Allergen Reactions   Penicillins Hives and Rash    Has patient had a PCN reaction causing immediate rash, facial/tongue/throat swelling, SOB or lightheadedness with hypotension: Yes Has patient had a PCN reaction causing severe rash involving mucus membranes or skin necrosis: Yes Has patient had a PCN reaction that required hospitalization No Has patient had a PCN reaction occurring within the last 10 years: Yes If all of the above answers are "NO", then may proceed with Cephalosporin use.    Tape Rash    Medipore, Coban, and paper tape CAN be tolerated    Current Outpatient Medications  Medication Sig Dispense Refill   albuterol (PROVENTIL HFA;VENTOLIN HFA) 108 (90 Base) MCG/ACT inhaler Inhale 2 puffs into the lungs every 6 (six) hours as needed for wheezing or shortness of breath. 1 Inhaler 2   amLODipine (NORVASC) 2.5 MG tablet Take 2.5 mg by mouth daily.      aspirin EC 81 MG tablet Take 81 mg by mouth daily.     Calcium Carb-Cholecalciferol (CALCIUM 600+D) 600-800 MG-UNIT TABS Take 1 tablet by mouth 2 (two) times daily.     Cholecalciferol (VITAMIN D-3) 1000 units CAPS Take 1,000 Units daily by mouth.      clonazePAM (KLONOPIN) 0.5 MG tablet Take 1 tablet (0.5 mg total) by mouth at bedtime. 90 tablet 2   cyanocobalamin  (,VITAMIN B-12,) 1000 MCG/ML injection INJECT 1 ML INTO THE MUSCLE ONCE MONTHLY AS DIRECTED. 1 mL 5   furosemide (LASIX) 40 MG tablet Take 40 mg by mouth daily. Can go up to 80 mg daily per pt     gabapentin (NEURONTIN) 300 MG capsule Take 300 mg by mouth at bedtime.     hydroxyurea (HYDREA) 500 MG capsule TAKE 1 CAPSULE DAILY TUESDAY-FRIDAY AND 2 CAPSULES ON SATURDAY-MONDAY. 120 capsule 4   ibandronate (BONIVA) 150 MG tablet Take 1 tablet by mouth every 30 (thirty) days.     ibuprofen (ADVIL,MOTRIN) 800 MG tablet Take 800 mg every 8 (eight) hours as needed by mouth for moderate pain.     ipratropium-albuterol (DUONEB) 0.5-2.5 (3) MG/3ML SOLN Take 3 mLs by nebulization every 6 (six) hours as needed (shortness of breath). 360 mL 0   methocarbamol (ROBAXIN) 500 MG tablet Take 500 mg daily as needed by mouth for muscle spasms.      nitroGLYCERIN (NITRODUR - DOSED IN MG/24 HR) 0.1 mg/hr patch Place 0.1 mg onto the skin daily.      ondansetron (ZOFRAN) 8 MG tablet Take 1 tablet (8 mg total) by mouth every 8 (eight) hours as needed for nausea or vomiting. 20 tablet 5   pantoprazole (PROTONIX) 40 MG tablet Take 40 mg daily by mouth.     potassium chloride SA (K-DUR,KLOR-CON) 20 MEQ tablet Take 1 tablet (20 mEq total) by mouth 3 (three) times daily. 90 tablet 5   rosuvastatin (CRESTOR) 40 MG tablet Take 40 mg at bedtime by mouth.      traMADol (ULTRAM) 50 MG tablet Take 50 mg by mouth as needed.      No current facility-administered medications for this visit.      Review of Systems:  Constitutional: weight loss Cardiovascular: exertional chest pain, lower extremity swelling Pulmonary: dyspnea, cough GI: constipation  MSK: back pain Neuro: no headaches     PHYSICAL EXAMINATION: There were no vitals taken for this visit. General: non-toxic, appears older stated age.  Frail HEENT: NCAT.  Moist mucous membranes, Oropharynx clear.  No cervical or supraclavicular adenopathy. Neuro:  Alert, Oriented X 3. Non focal . Psych: appropriate mood. Cardiovascular: RRR, no murmur Pulmonary:  Clear B.  No crackles GI: soft, ND MSK: ambulates well.   Diagnostic Studies & Laboratory data:     Recent Radiology Findings:   Ct Chest Wo Contrast  Result Date: 10/14/2018 CLINICAL DATA:  LEFT upper lobe pulmonary nodule. History of breast cancer EXAM: CT CHEST WITHOUT CONTRAST TECHNIQUE: Multidetector CT imaging of the chest was performed following the standard protocol without IV contrast. COMPARISON:  CT 04/15/2017 PET-CT 07/29/2017 FINDINGS: Cardiovascular: Coronary artery calcification and aortic atherosclerotic calcification. Ascending aorta measures 42 mm similar to comparison exam Mediastinum/Nodes: No axillary or supraclavicular lymphadenopathy. No mediastinal or hilar adenopathy. Lungs/Pleura: Spiculated nodule in the RIGHT upper lobe measures 12 mm x 9 mm (image 63/4) compared to 10 mm x 5 mm. Lesion is increased in density. More inferiorly lobular pleural base nodule measuring 17 mm by 7 mm (image 72/4) is new from prior. Additional RIGHT upper lobe peripheral nodule measures 8 mm on image 60/4 is new from prior. In LEFT upper lobe 3 mm nodule and 3 mm nodule image 28/4 new from prior. Benign appearing nodule along the RIGHT oblique fissure measures 7 mm unchanged. Upper Abdomen: Limited view of the liver, kidneys, pancreas are unremarkable. Normal adrenal glands. Musculoskeletal: Posterior lumbar fusion IMPRESSION: 1. Enlarging RIGHT upper lobe spiculated pulmonary nodule and new adjacent pleural base nodule. Recommend FDG PET scan to determine malignant potential. 2. Several additional peripheral nodules in LEFT and RIGHT lung are new from prior. Recommend attention on follow-up. 3. Stable aneurysmal dilatation of  ascending aorta. Electronically Signed   By: Suzy Bouchard M.D.   On: 10/14/2018 12:37   Nm Pet Image Restag (ps) Skull Base To Thigh  Result Date: 10/24/2018 CLINICAL  DATA:  Subsequent treatment strategy for new right upper lobe pulmonary nodules. Remote history of breast cancer. EXAM: NUCLEAR MEDICINE PET SKULL BASE TO THIGH TECHNIQUE: 5.5 mCi F-18 FDG was injected intravenously. Full-ring PET imaging was performed from the skull base to thigh after the radiotracer. CT data was obtained and used for attenuation correction and anatomic localization. Fasting blood glucose: 137 mg/dl COMPARISON:  Chest CT 10/14/2018. CTA of the chest, abdomen and pelvis 04/15/2018. FINDINGS: Mediastinal blood pool activity: SUV max 2.2 Liver activity: SUV max NA NECK: No areas of abnormal hypermetabolism. Incidental CT findings: No cervical adenopathy. Left carotid atherosclerosis. CHEST: Hypermetabolism corresponding to the right upper lobe spiculated pulmonary nodule. This measures a 1.0 cm and a S.U.V. max of 8.0 on image 33/8. Just inferior and lateral to this, a pleural-based nodule measures 1.8 cm and a S.U.V. max of 10.0 on 37/8. No thoracic nodal hypermetabolism. Incidental CT findings: Deferred to recent diagnostic CT. Advanced bullous type emphysema. Other pulmonary nodules detailed on the prior exam are below PET resolution. Ascending aortic dilatation has been detailed previously. Tortuous thoracic aorta. Cardiomegaly. Aortic and coronary artery atherosclerosis. No axillary adenopathy. ABDOMEN/PELVIS: No abdominopelvic parenchymal or nodal hypermetabolism. Incidental CT findings: Right adrenal enlargement is likely related to hyperplasia. Mild chronic left adrenal nodularity. Abdominal aortic atherosclerosis. Low-density bilateral renal lesions are likely cysts. There also subcentimeter hyperattenuating right renal lesions which are indeterminate. Hyperattenuation within the left renal collecting system measures 1.7  cm on image 119/4. This may have been present on prior exams including back to 01/21/2017. Large colonic stool burden. Pelvic floor laxity. Hysterectomy. Pancreatic  parenchymal calcifications likely represent chronic calcific pancreatitis. SKELETON: Hypermetabolism about the left facets at approximately the C3-4 level is just cephalad to surgical changes and favored to be degenerative. Incidental CT findings: Cervical and lumbar spine fixation. IMPRESSION: 1. Hypermetabolic right upper lobe pulmonary nodules which are suspicious for synchronous primary bronchogenic carcinomas. Metastatic disease felt less likely. 2. No evidence of thoracic nodal hypermetabolism or typical findings of distant metastasis. 3. Aortic atherosclerosis (ICD10-I70.0), coronary artery atherosclerosis and emphysema (ICD10-J43.9). 4. Ascending aortic aneurysm, as detailed on prior exams. 5. Hyperattenuation in the interpolar left renal collecting system. This is felt to have been present over prior exams, favoring a benign etiology (i.e. The sequelae of prior infection). Consider correlation with urinalysis. Recommend attention on follow-up. Electronically Signed   By: Abigail Miyamoto M.D.   On: 10/24/2018 16:59       I have independently reviewed the above radiology studies  and reviewed the findings with the patient.   Recent Lab Findings: Lab Results  Component Value Date   WBC 6.5 10/07/2018   HGB 11.3 (L) 10/07/2018   HCT 34.3 (L) 10/07/2018   PLT 390 10/07/2018   GLUCOSE 99 10/07/2018   ALT 15 10/07/2018   AST 24 10/07/2018   NA 137 10/07/2018   K 3.9 10/07/2018   CL 100 10/07/2018   CREATININE 1.90 (H) 10/07/2018   BUN 25 (H) 10/07/2018   CO2 26 10/07/2018   INR 0.99 10/01/2017     PFTs: 11/03/18 - FVC: 107% - FEV1: 82% -DLCO: 48%    Problem List:  - CT chest 10/14/18 PET/CT 10/24/18 -   1.2cm right upper lobe spiculated nodule.  Increased in sized.  SUV 8  1.7cm right upper lobe pleural based nodule. SUV 10  0.8cm right upper lobe nodule  0.3cm left upper lobe nodule - Marginal Lung Function - DLCO of 48% - Frailty - Thrombocytopenia, myeloproliferative  disorder - Exertional angina - CHF with EF of 40-45% in 2017 - Active smoking - CRI: creat of 1.9 on 10/07/18  Assessment / Plan:   63 female with history of breast cancer, and significant smoking history presents with 3 right upper lobe pulmonary nodules.  Differential includes primary lung cancer, vs metastatic breast cancer.  She will require a tissue diagnosis, and I have recommended that she undergo a CT guided biopsy of the pleural based mass.  Once completed, we will have a better direction on how to treat her.  I have also explained to her the importance of smoking cessation.  In regards to her pulmonary function, since they are marginal, I have also recommended that she meet with her pulmonologist to initiate pulmonary rehab.  At this current state, she would not tolerate a lobectomy.  I  spent 30 minutes with  the patient face to face and greater then 50% of the time was spent in counseling and coordination of care.    Lajuana Matte 11/07/2018 10:32 AM

## 2018-11-12 ENCOUNTER — Telehealth (HOSPITAL_COMMUNITY): Payer: Self-pay

## 2018-11-14 ENCOUNTER — Other Ambulatory Visit (HOSPITAL_COMMUNITY): Payer: Medicare Other

## 2018-11-18 ENCOUNTER — Ambulatory Visit (HOSPITAL_COMMUNITY)
Admission: RE | Admit: 2018-11-18 | Discharge: 2018-11-18 | Disposition: A | Discharge status: Home / Self Care | Payer: Medicare Other | Source: Ambulatory Visit | Attending: Thoracic Surgery (Cardiothoracic Vascular Surgery) | Admitting: Thoracic Surgery (Cardiothoracic Vascular Surgery)

## 2018-11-18 DIAGNOSIS — Z01812 Encounter for preprocedural laboratory examination: Secondary | ICD-10-CM | POA: Insufficient documentation

## 2018-11-18 DIAGNOSIS — Z20828 Contact with and (suspected) exposure to other viral communicable diseases: Secondary | ICD-10-CM | POA: Insufficient documentation

## 2018-11-18 LAB — SARS CORONAVIRUS 2 (TAT 6-24 HRS): SARS Coronavirus 2: NEGATIVE

## 2018-11-19 ENCOUNTER — Encounter (HOSPITAL_COMMUNITY): Payer: Medicare Other

## 2018-11-20 ENCOUNTER — Other Ambulatory Visit: Payer: Self-pay | Admitting: Radiology

## 2018-11-21 ENCOUNTER — Ambulatory Visit (HOSPITAL_COMMUNITY)
Admission: RE | Admit: 2018-11-21 | Discharge: 2018-11-21 | Disposition: A | Discharge status: Home / Self Care | Payer: Medicare Other | Source: Ambulatory Visit | Attending: Nurse Practitioner | Admitting: Nurse Practitioner

## 2018-11-21 ENCOUNTER — Encounter (HOSPITAL_COMMUNITY): Payer: Self-pay

## 2018-11-21 ENCOUNTER — Ambulatory Visit (HOSPITAL_COMMUNITY)
Admission: RE | Admit: 2018-11-21 | Discharge: 2018-11-21 | Disposition: A | Discharge status: Home / Self Care | Payer: Medicare Other | Source: Ambulatory Visit | Attending: Radiology | Admitting: Radiology

## 2018-11-21 ENCOUNTER — Inpatient Hospital Stay (HOSPITAL_COMMUNITY)
Admission: RE | Admit: 2018-11-21 | Discharge: 2018-11-23 | DRG: 200 | Disposition: A | Discharge status: Home / Self Care | Payer: Medicare Other | Source: Ambulatory Visit | Attending: Family Medicine | Admitting: Family Medicine

## 2018-11-21 ENCOUNTER — Other Ambulatory Visit (HOSPITAL_COMMUNITY): Payer: Self-pay | Admitting: Nurse Practitioner

## 2018-11-21 ENCOUNTER — Other Ambulatory Visit: Payer: Self-pay

## 2018-11-21 DIAGNOSIS — J95811 Postprocedural pneumothorax: Secondary | ICD-10-CM | POA: Diagnosis present

## 2018-11-21 DIAGNOSIS — D473 Essential (hemorrhagic) thrombocythemia: Secondary | ICD-10-CM | POA: Diagnosis present

## 2018-11-21 DIAGNOSIS — Z7982 Long term (current) use of aspirin: Secondary | ICD-10-CM

## 2018-11-21 DIAGNOSIS — I110 Hypertensive heart disease with heart failure: Secondary | ICD-10-CM | POA: Diagnosis present

## 2018-11-21 DIAGNOSIS — R911 Solitary pulmonary nodule: Secondary | ICD-10-CM

## 2018-11-21 DIAGNOSIS — I700 Atherosclerosis of aorta: Secondary | ICD-10-CM | POA: Diagnosis present

## 2018-11-21 DIAGNOSIS — Z79899 Other long term (current) drug therapy: Secondary | ICD-10-CM | POA: Diagnosis not present

## 2018-11-21 DIAGNOSIS — Z853 Personal history of malignant neoplasm of breast: Secondary | ICD-10-CM

## 2018-11-21 DIAGNOSIS — J939 Pneumothorax, unspecified: Secondary | ICD-10-CM

## 2018-11-21 DIAGNOSIS — Z85820 Personal history of malignant melanoma of skin: Secondary | ICD-10-CM | POA: Diagnosis not present

## 2018-11-21 DIAGNOSIS — Z20828 Contact with and (suspected) exposure to other viral communicable diseases: Secondary | ICD-10-CM | POA: Diagnosis present

## 2018-11-21 DIAGNOSIS — F17210 Nicotine dependence, cigarettes, uncomplicated: Secondary | ICD-10-CM | POA: Diagnosis present

## 2018-11-21 DIAGNOSIS — Z981 Arthrodesis status: Secondary | ICD-10-CM

## 2018-11-21 DIAGNOSIS — Z91048 Other nonmedicinal substance allergy status: Secondary | ICD-10-CM

## 2018-11-21 DIAGNOSIS — E7800 Pure hypercholesterolemia, unspecified: Secondary | ICD-10-CM | POA: Diagnosis present

## 2018-11-21 DIAGNOSIS — Z9012 Acquired absence of left breast and nipple: Secondary | ICD-10-CM | POA: Diagnosis not present

## 2018-11-21 DIAGNOSIS — E785 Hyperlipidemia, unspecified: Secondary | ICD-10-CM | POA: Diagnosis present

## 2018-11-21 DIAGNOSIS — Z9221 Personal history of antineoplastic chemotherapy: Secondary | ICD-10-CM | POA: Diagnosis not present

## 2018-11-21 DIAGNOSIS — M1990 Unspecified osteoarthritis, unspecified site: Secondary | ICD-10-CM | POA: Diagnosis present

## 2018-11-21 DIAGNOSIS — Z8249 Family history of ischemic heart disease and other diseases of the circulatory system: Secondary | ICD-10-CM

## 2018-11-21 DIAGNOSIS — D539 Nutritional anemia, unspecified: Secondary | ICD-10-CM | POA: Diagnosis present

## 2018-11-21 DIAGNOSIS — J449 Chronic obstructive pulmonary disease, unspecified: Secondary | ICD-10-CM | POA: Diagnosis present

## 2018-11-21 DIAGNOSIS — I2510 Atherosclerotic heart disease of native coronary artery without angina pectoris: Secondary | ICD-10-CM | POA: Diagnosis present

## 2018-11-21 DIAGNOSIS — J9819 Other pulmonary collapse: Secondary | ICD-10-CM

## 2018-11-21 DIAGNOSIS — Z938 Other artificial opening status: Secondary | ICD-10-CM

## 2018-11-21 DIAGNOSIS — I5040 Unspecified combined systolic (congestive) and diastolic (congestive) heart failure: Secondary | ICD-10-CM | POA: Diagnosis present

## 2018-11-21 DIAGNOSIS — J439 Emphysema, unspecified: Secondary | ICD-10-CM | POA: Diagnosis present

## 2018-11-21 DIAGNOSIS — Z880 Allergy status to penicillin: Secondary | ICD-10-CM

## 2018-11-21 DIAGNOSIS — J431 Panlobular emphysema: Secondary | ICD-10-CM | POA: Diagnosis not present

## 2018-11-21 DIAGNOSIS — Z86010 Personal history of colonic polyps: Secondary | ICD-10-CM | POA: Diagnosis not present

## 2018-11-21 DIAGNOSIS — I5189 Other ill-defined heart diseases: Secondary | ICD-10-CM

## 2018-11-21 DIAGNOSIS — K219 Gastro-esophageal reflux disease without esophagitis: Secondary | ICD-10-CM | POA: Diagnosis present

## 2018-11-21 DIAGNOSIS — Z825 Family history of asthma and other chronic lower respiratory diseases: Secondary | ICD-10-CM

## 2018-11-21 LAB — CBC
HCT: 39.4 % (ref 36.0–46.0)
Hemoglobin: 12.9 g/dL (ref 12.0–15.0)
MCH: 36.6 pg — ABNORMAL HIGH (ref 26.0–34.0)
MCHC: 32.7 g/dL (ref 30.0–36.0)
MCV: 111.9 fL — ABNORMAL HIGH (ref 80.0–100.0)
Platelets: 408 10*3/uL — ABNORMAL HIGH (ref 150–400)
RBC: 3.52 MIL/uL — ABNORMAL LOW (ref 3.87–5.11)
RDW: 14.3 % (ref 11.5–15.5)
WBC: 6 10*3/uL (ref 4.0–10.5)
nRBC: 0 % (ref 0.0–0.2)

## 2018-11-21 LAB — BASIC METABOLIC PANEL
Anion gap: 10 (ref 5–15)
BUN: 13 mg/dL (ref 8–23)
CO2: 24 mmol/L (ref 22–32)
Calcium: 8.1 mg/dL — ABNORMAL LOW (ref 8.9–10.3)
Chloride: 102 mmol/L (ref 98–111)
Creatinine, Ser: 0.91 mg/dL (ref 0.44–1.00)
GFR calc Af Amer: >60 mL/min (ref >60)
GFR calc non Af Amer: >60 mL/min (ref >60)
Glucose, Bld: 118 mg/dL — ABNORMAL HIGH (ref 70–99)
Potassium: 3.5 mmol/L (ref 3.5–5.1)
Sodium: 136 mmol/L (ref 135–145)

## 2018-11-21 LAB — PROTIME-INR
INR: 1 (ref 0.8–1.2)
Prothrombin Time: 12.8 seconds (ref 11.4–15.2)

## 2018-11-21 LAB — MRSA PCR SCREENING: MRSA by PCR: NEGATIVE

## 2018-11-21 MED ORDER — FENTANYL CITRATE (PF) 100 MCG/2ML IJ SOLN
INTRAMUSCULAR | Status: AC
  Filled 2018-11-21: qty 2

## 2018-11-21 MED ORDER — CLONAZEPAM 0.5 MG PO TABS
0.5000 mg | ORAL_TABLET | Freq: Every day | ORAL | Status: DC
  Administered 2018-11-21 – 2018-11-22 (×2): 0.5 mg via ORAL
  Filled 2018-11-21 (×2): qty 1

## 2018-11-21 MED ORDER — NITROGLYCERIN 0.1 MG/HR TD PT24
0.1000 mg | MEDICATED_PATCH | Freq: Every day | TRANSDERMAL | Status: DC
  Administered 2018-11-21 – 2018-11-23 (×3): 0.1 mg via TRANSDERMAL
  Filled 2018-11-21 (×3): qty 1

## 2018-11-21 MED ORDER — AMLODIPINE BESYLATE 2.5 MG PO TABS
2.5000 mg | ORAL_TABLET | Freq: Every day | ORAL | Status: DC
  Administered 2018-11-21 – 2018-11-23 (×3): 2.5 mg via ORAL
  Filled 2018-11-21 (×4): qty 1

## 2018-11-21 MED ORDER — ONDANSETRON HCL 4 MG/2ML IJ SOLN: 4.0000 mg | Freq: Four times a day (QID) | INTRAMUSCULAR | Status: DC | PRN

## 2018-11-21 MED ORDER — HYDROXYUREA 500 MG PO CAPS
1000.0000 mg | ORAL_CAPSULE | ORAL | Status: DC
  Administered 2018-11-22: 1000 mg via ORAL
  Filled 2018-11-21 (×2): qty 2

## 2018-11-21 MED ORDER — METHOCARBAMOL 500 MG PO TABS
500.0000 mg | ORAL_TABLET | Freq: Four times a day (QID) | ORAL | Status: DC | PRN
  Administered 2018-11-21: 500 mg via ORAL
  Filled 2018-11-21: qty 1

## 2018-11-21 MED ORDER — FENTANYL CITRATE (PF) 100 MCG/2ML IJ SOLN
INTRAMUSCULAR | Status: AC | PRN
  Administered 2018-11-21 (×2): 50 ug via INTRAVENOUS
  Administered 2018-11-21: 25 ug via INTRAVENOUS

## 2018-11-21 MED ORDER — POTASSIUM CHLORIDE CRYS ER 20 MEQ PO TBCR
60.0000 meq | EXTENDED_RELEASE_TABLET | Freq: Every day | ORAL | Status: DC
  Administered 2018-11-21 – 2018-11-22 (×2): 60 meq via ORAL
  Filled 2018-11-21 (×2): qty 3

## 2018-11-21 MED ORDER — SODIUM CHLORIDE 0.9 % IV SOLN
INTRAVENOUS | Status: DC | PRN
  Administered 2018-11-21: 10 mL/h via INTRAVENOUS

## 2018-11-21 MED ORDER — MIDAZOLAM HCL 2 MG/2ML IJ SOLN
INTRAMUSCULAR | Status: DC | PRN
  Administered 2018-11-21 (×3): 1 mg via INTRAVENOUS

## 2018-11-21 MED ORDER — GABAPENTIN 300 MG PO CAPS
300.0000 mg | ORAL_CAPSULE | Freq: Every day | ORAL | Status: DC
  Administered 2018-11-21 – 2018-11-22 (×2): 300 mg via ORAL
  Filled 2018-11-21 (×2): qty 1

## 2018-11-21 MED ORDER — POLYETHYLENE GLYCOL 3350 17 G PO PACK: 17.0000 g | PACK | Freq: Every day | ORAL | Status: DC | PRN

## 2018-11-21 MED ORDER — ROSUVASTATIN CALCIUM 20 MG PO TABS
40.0000 mg | ORAL_TABLET | Freq: Every day | ORAL | Status: DC
  Administered 2018-11-21 – 2018-11-22 (×3): 40 mg via ORAL
  Filled 2018-11-21 (×3): qty 2

## 2018-11-21 MED ORDER — SODIUM CHLORIDE 0.9 % IV SOLN: INTRAVENOUS | Status: DC

## 2018-11-21 MED ORDER — HYDROXYUREA 500 MG PO CAPS
500.0000 mg | ORAL_CAPSULE | ORAL | Status: DC
  Administered 2018-11-21: 500 mg via ORAL
  Filled 2018-11-21: qty 1

## 2018-11-21 MED ORDER — MIDAZOLAM HCL 2 MG/2ML IJ SOLN
INTRAMUSCULAR | Status: AC
  Filled 2018-11-21: qty 2

## 2018-11-21 MED ORDER — ACETAMINOPHEN 325 MG PO TABS: 650.0000 mg | ORAL_TABLET | Freq: Four times a day (QID) | ORAL | Status: DC | PRN

## 2018-11-21 MED ORDER — HYDROCODONE-ACETAMINOPHEN 5-325 MG PO TABS
1.0000 | ORAL_TABLET | ORAL | Status: DC | PRN
  Administered 2018-11-21: 2 via ORAL
  Administered 2018-11-21: 20:00:00 1 via ORAL
  Administered 2018-11-22 – 2018-11-23 (×6): 2 via ORAL
  Filled 2018-11-21: qty 2
  Filled 2018-11-21: qty 1
  Filled 2018-11-21 (×6): qty 2

## 2018-11-21 MED ORDER — PANTOPRAZOLE SODIUM 40 MG PO TBEC
40.0000 mg | DELAYED_RELEASE_TABLET | Freq: Every day | ORAL | Status: DC
  Administered 2018-11-22 – 2018-11-23 (×2): 40 mg via ORAL
  Filled 2018-11-21 (×3): qty 1

## 2018-11-21 MED ORDER — FUROSEMIDE 20 MG PO TABS
20.0000 mg | ORAL_TABLET | Freq: Every day | ORAL | Status: DC
  Administered 2018-11-21 – 2018-11-22 (×2): 20 mg via ORAL
  Filled 2018-11-21 (×2): qty 1

## 2018-11-21 MED ORDER — IPRATROPIUM-ALBUTEROL 0.5-2.5 (3) MG/3ML IN SOLN: 3.0000 mL | Freq: Four times a day (QID) | RESPIRATORY_TRACT | Status: DC | PRN

## 2018-11-21 MED ORDER — ENOXAPARIN SODIUM 40 MG/0.4ML ~~LOC~~ SOLN
40.0000 mg | SUBCUTANEOUS | Status: DC
  Administered 2018-11-21 – 2018-11-22 (×2): 40 mg via SUBCUTANEOUS
  Filled 2018-11-21 (×2): qty 0.4

## 2018-11-21 MED ORDER — FENTANYL CITRATE (PF) 100 MCG/2ML IJ SOLN
25.0000 ug | INTRAMUSCULAR | Status: DC | PRN
  Administered 2018-11-22: 25 ug via INTRAVENOUS
  Filled 2018-11-21: qty 2

## 2018-11-21 MED ORDER — LIDOCAINE HCL 1 % IJ SOLN
INTRAMUSCULAR | Status: AC
  Filled 2018-11-21: qty 20

## 2018-11-21 MED ORDER — FUROSEMIDE 20 MG PO TABS
40.0000 mg | ORAL_TABLET | ORAL | Status: DC
  Administered 2018-11-22 – 2018-11-23 (×2): 40 mg via ORAL
  Filled 2018-11-21 (×3): qty 2

## 2018-11-21 MED ORDER — ONDANSETRON HCL 4 MG PO TABS: 4.0000 mg | ORAL_TABLET | Freq: Four times a day (QID) | ORAL | Status: DC | PRN

## 2018-11-21 MED ORDER — ACETAMINOPHEN 650 MG RE SUPP: 650.0000 mg | Freq: Four times a day (QID) | RECTAL | Status: DC | PRN

## 2018-11-21 MED ORDER — ASPIRIN EC 81 MG PO TBEC
81.0000 mg | DELAYED_RELEASE_TABLET | Freq: Every day | ORAL | Status: DC
  Administered 2018-11-21 – 2018-11-23 (×3): 81 mg via ORAL
  Filled 2018-11-21 (×4): qty 1

## 2018-11-21 NOTE — Sedation Documentation (Signed)
Pt seems to be resting well at this time with no complaints. Pt did tolerate a sip of coke well. Pt states that her chest still hurts some. Vitals are stable.

## 2018-11-21 NOTE — H&P (Signed)
History and Physical    KIELA SHISLER OFH:[id number] DOB: 02/01/1951 DOA: (Not on file)  Referring MD/NP/PA: Markus Daft, MD PCP: [name], NP  Patient coming from: IR  Chief Complaint: Pulmonary nodules  I have personally briefly reviewed patient's old medical records in [place]   HPI: Ashley [name] is a 68 y.o. 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  She had been being evaluated by Dr. Kipp Brood of cardiothoracic surgery for his right upper lobe pulmonary nodules.  She had previously been treated with mastectomy for adenocarcinoma of Ashley left breast in 1995.  At baseline patient does not require oxygen.  She does complain of having a chronic cough, but still continues to smoke cigarettes on a regular basis.  Patient reports having previous back surgery 3 years ago and losing around 60 pounds.  She reports that she was able to gain a little bit of her weight back and now currently weighs around 112 pounds.  Today, she underwent CT-guided biopsy of Ashley pulmonary nodules, but suffered a pneumothorax.  Chest tube was placed.  TRH called to admit for management of her other medical problems.  IR will manage chest tube.  ED Course: As seen above  Review of Systems  Respiratory: Positive for cough and shortness of breath.   Cardiovascular: Positive for chest pain.  Otherwise a 10 point review of systems was completed and negative except for as noted in HPI  Past Medical History:  Diagnosis Date   Adenocarcinoma of left breast ([place]) 01/09/2016   Arthritis    Ascending aortic aneurysm (HCC)    Cancer ([place]) 1995   breast, left, mastectomy/chemo   Chest pain    Possibly cardiac. No evidence of ischemia/injury based upon normal troponin I. Chest discomfort could be tachycardia induced  supply demand mismatch.    CHF (congestive heart failure) ([place]) 11/17/2015   after surgery    Colon adenomas    Coronary artery disease    Dyspnea    with exertion   Emphysema of lung ([place]) 11/17/2015   Essential hypertension, benign    GERD (gastroesophageal reflux disease)    Hyperlipidemia    Hypertension    Melanoma ([place]) 01/09/2016   Palpitations    Pernicious anemia 03/06/2016   Pure hypercholesterolemia    Thrombocythemia, essential ([place]) 01/09/2016   Thrombocytosis ([place])    Idiopathic    Past Surgical History:  Procedure Laterality Date   ANTERIOR AND POSTERIOR REPAIR N/A 12/09/2014   Procedure: ANTERIOR (CYSTOCELE) AND POSTERIOR REPAIR (RECTOCELE);  Surgeon: Bjorn Loser, MD;  Location: [facility];  Service: Urology;  Laterality: N/A;   ANTERIOR CERVICAL DECOMPRESSION/DISCECTOMY FUSION 4 LEVELS Right 10/03/2016   Procedure: ANTERIOR CERVICAL DECOMPRESSION FUSION, CERVICAL 4-5, CERVICAL 5-6, CERVICAL 6-7, CERVICAL 7 TO THORACIC 1 WITH INSTRUMENTATION AND ALLOGRAFT;  Surgeon: Phylliss Bob, MD;  Location: [facility];  Service: Orthopedics;  Laterality: Right;  ANTERIOR CERVICAL DECOMPRESSION FUSION, CERVICAL 4-5, CERVICAL 5-6, CERVICAL 6-7, CERVICAL 7 TO THORACIC 1 WITH INSTRUMENTATION AND ALLOGRAFT; REQUEST 4 HO   ANTERIOR LAT LUMBAR FUSION Left 11/16/2015   Procedure: LEFT SIDED LATERAL INTERBODY FUSION, LUMBAR 2-3, LUMBAR 3-4, LUMBAR 4-5 WITH INSTRUMENTATION;  Surgeon: Phylliss Bob, MD;  Location: [facility];  Service: Orthopedics;  Laterality: Left;  LEFT SIDED LATEARL INTERBODY FUSION, LUMBAR 2-3, LUMBAR 3-4, LUMBAR 4-5 WITH INSTRUMENTATION  APPENDECTOMY     BONE MARROW ASPIRATION  07/2012   BONE MARROW BIOPSY  07/2012   BREAST SURGERY     CARDIAC CATHETERIZATION     CARDIAC CATHETERIZATION N/A 01/20/2016   Procedure: Left Heart Cath and Coronary Angiography;  Surgeon: Burnell Blanks, MD;  Location: [facility];  Service: Cardiovascular;   Laterality: N/A;   COLONOSCOPY  11/29/2010   Procedure: COLONOSCOPY;  Surgeon: Rogene Houston, MD;  [facility];  Service: Endoscopy;  Laterality: N/A;   COLONOSCOPY N/A 02/18/2014   Procedure: COLONOSCOPY;  Surgeon: Rogene Houston, MD;  [facility];  Service: Endoscopy;  Laterality: N/A;  1030   COLONOSCOPY N/A 02/25/2017   Procedure: COLONOSCOPY;  Surgeon: Rogene Houston, MD;  [facility];  Service: Endoscopy;  Laterality: N/A;  10:55   CYSTOSCOPY N/A 12/09/2014   Procedure: CYSTOSCOPY;  Surgeon: Bjorn Loser, MD;  Location: [facility];  Service: Urology;  Laterality: N/A;   ESOPHAGEAL DILATION N/A 02/25/2017   Procedure: ESOPHAGEAL DILATION;  Surgeon: Rogene Houston, MD;  [facility];  Service: Endoscopy;  Laterality: N/A;   ESOPHAGOGASTRODUODENOSCOPY N/A 02/25/2017   Procedure: ESOPHAGOGASTRODUODENOSCOPY (EGD);  Surgeon: Rogene Houston, MD;  [facility];  Service: Endoscopy;  Laterality: N/A;   IR GENERIC HISTORICAL  01/11/2016   IR RADIOLOGY PERIPHERAL GUIDED IV START 01/11/2016 Saverio Danker, PA-C [facility]   IR GENERIC HISTORICAL  01/11/2016   IR US GUIDE VASC ACCESS RIGHT 01/11/2016 Saverio Danker, PA-C [facility]   MASTECTOMY     left   OVARIAN CYST SURGERY     x2   POLYPECTOMY  02/25/2017   Procedure: POLYPECTOMY;  Surgeon: Rogene Houston, MD;  [facility];  Service: Endoscopy;;   SALPINGOOPHORECTOMY Bilateral 12/09/2014   Procedure: SALPINGO OOPHORECTOMY;  Surgeon: Servando Salina, MD;  Location: [facility];  Service: Gynecology;  Laterality: Bilateral;   TUBAL LIGATION     VAGINAL HYSTERECTOMY N/A 12/09/2014   Procedure: HYSTERECTOMY VAGINAL;  Surgeon: Servando Salina, MD;  Location: [facility];  Service: Gynecology;  Laterality: N/A;     reports that she has been smoking cigarettes. She has a 25.00 pack-year smoking history. She has never used smokeless tobacco. She reports that she  does not drink alcohol or use drugs.  Allergies  Allergen Reactions   Penicillins Hives and Rash    Did it involve swelling of Ashley face/tongue/throat, SOB, or low BP? No Did it involve sudden or severe rash/hives, skin peeling, or any reaction on Ashley inside of your mouth or nose? No Did you need to seek medical attention at a hospital or doctor's office? No When did it last happen?5-10 year If all above answers are NO, may proceed with cephalosporin use.     Tape Rash    Medipore, Coban, and paper tape CAN be tolerated    Family History  Problem Relation Age of Onset   Hypertension Mother    Heart failure Mother    Congestive Heart Failure Mother    COPD Mother    Pernicious anemia Mother    Cancer Mother        lung   Hypertension Father    CAD Father    Heart attack Father    Hypertension Sister    Cancer Other    Celiac disease Other     Prior to Admission medications   Medication Sig Start Date End Date Taking? Authorizing Provider  amLODipine (NORVASC)  2.5 MG tablet Take 2.5 mg by mouth daily.  05/27/18  Yes [provider]  aspirin EC 81 MG tablet Take 81 mg by mouth daily.   Yes [provider]  Calcium Carb-Cholecalciferol (CALCIUM 600+D) 600-800 MG-UNIT TABS Take 1 tablet by mouth 2 (two) times daily.   Yes [provider]  Cholecalciferol (VITAMIN D-3) 1000 units CAPS Take 1,000 Units daily by mouth.    Yes [provider]  clonazePAM (KLONOPIN) 0.5 MG tablet Take 1 tablet (0.5 mg total) by mouth at bedtime. 10/20/18  Yes Derek Jack, MD  cyanocobalamin (,VITAMIN B-12,) 1000 MCG/ML injection INJECT 1 ML INTO Ashley MUSCLE ONCE MONTHLY AS DIRECTED. Patient taking differently: Inject 1,000 mcg into Ashley muscle every 30 (thirty) days.  09/26/16  Yes Kefalas, Manon Hilding, PA-C  furosemide (LASIX) 40 MG tablet Take 20-40 mg by mouth See admin instructions. Take 40 mg in Ashley morning and 20 mg at night 01/16/17  Yes  [provider]  gabapentin (NEURONTIN) 300 MG capsule Take 300 mg by mouth at bedtime.   Yes [provider]  hydroxyurea (HYDREA) 500 MG capsule TAKE 1 CAPSULE DAILY TUESDAY-FRIDAY AND 2 CAPSULES ON SATURDAY-MONDAY. Patient taking differently: Take 500-1,000 mg by mouth See admin instructions. Take 500 mg at night on Tue, Wed, Thur, and Fri, take 1000 mg at night on Sat, Sun, and Mon 10/20/18  Yes Derek Jack, MD  ibandronate (BONIVA) 150 MG tablet Take 1 tablet by mouth every 30 (thirty) days. 07/14/12  Yes [provider]  ibuprofen (ADVIL,MOTRIN) 800 MG tablet Take 800 mg every 8 (eight) hours as needed by mouth for moderate pain.   Yes [provider]  methocarbamol (ROBAXIN) 500 MG tablet Take 500 mg by mouth every 6 (six) hours as needed for muscle spasms.    Yes [provider]  nitroGLYCERIN (NITRODUR - DOSED IN MG/24 HR) 0.1 mg/hr patch Place 0.1 mg onto Ashley skin daily.  10/08/18  Yes [provider]  ondansetron (ZOFRAN) 8 MG tablet Take 1 tablet (8 mg total) by mouth every 8 (eight) hours as needed for nausea or vomiting. 08/01/17  Yes Derek Jack, MD  pantoprazole (PROTONIX) 40 MG tablet Take 40 mg daily by mouth.   Yes [provider]  polyethylene glycol (MIRALAX / GLYCOLAX) 17 g packet Take 17 g by mouth daily as needed for mild constipation.   Yes [provider]  potassium chloride SA (K-DUR,KLOR-CON) 20 MEQ tablet Take 1 tablet (20 mEq total) by mouth 3 (three) times daily. Patient taking differently: Take 60 mEq by mouth at bedtime.  02/15/17  Yes Kefalas, Manon Hilding, PA-C  rosuvastatin (CRESTOR) 40 MG tablet Take 40 mg at bedtime by mouth.    Yes [provider]  traMADol (ULTRAM) 50 MG tablet Take 50 mg by mouth at bedtime as needed for moderate pain.  05/02/18  Yes [provider]  albuterol (PROVENTIL HFA;VENTOLIN HFA) 108 (90 Base) MCG/ACT inhaler Inhale 2 puffs into Ashley lungs  every 6 (six) hours as needed for wheezing or shortness of breath. 05/01/17   Mannam, Hart Robinsons, MD  ipratropium-albuterol (DUONEB) 0.5-2.5 (3) MG/3ML SOLN Take 3 mLs by nebulization every 6 (six) hours as needed (shortness of breath). 11/21/15   Arrien, Jimmy Picket, MD    Physical Exam:  Constitutional: Elderly appearing female who appears to be in some discomfort Vitals:   11/21/18 1315 11/21/18 1331 11/21/18 1346 11/21/18 1400  BP: (!) 141/91 (!) 106/59 (!) 143/88   Pulse: Marland Kitchen)  54 60 60 62  Resp: (!) 22 20 (!) 22 20  Temp:      TempSrc:      SpO2: 100% 99% 98%   Weight:      Height:       Eyes: PERRL, lids and conjunctivae normal ENMT: Mucous membranes are moist. Posterior pharynx clear of any exudate or lesions.  Neck: normal, supple, no masses, no thyromegaly Respiratory: Mildly tachypneic with decreased overall aeration.  Patient on 2 L nasal cannula oxygen currently at this time with O2 saturations maintained.  Chest tube present of Ashley right side of Ashley chest. Cardiovascular: Bradycardic, no murmurs / rubs / gallops. No extremity edema. 2+ pedal pulses. No carotid bruits.  Abdomen: no tenderness, no masses palpated. No hepatosplenomegaly. Bowel sounds positive.  Musculoskeletal: no clubbing / cyanosis. No joint deformity upper and lower extremities. Good ROM, no contractures. Normal muscle tone.  Skin: no rashes, lesions, ulcers. No induration Neurologic: CN 2-12 grossly intact. Sensation intact, DTR normal. Strength 5/5 in all 4.  Psychiatric: Normal judgment and insight. Alert and oriented x 3. Normal mood.     Labs on Admission: I have personally reviewed following labs and imaging studies  CBC: Recent Labs  Lab 11/21/18 0943  WBC 6.0  HGB 12.9  HCT 39.4  MCV 111.9*  PLT 185*   Basic Metabolic Panel: No results for input(s): NA, K, CL, CO2, GLUCOSE, BUN, CREATININE, CALCIUM, MG, PHOS in Ashley last 168 hours. GFR: CrCl cannot be calculated (Patient's most recent  lab result is older than Ashley maximum 21 days allowed.). Liver Function Tests: No results for input(s): AST, ALT, ALKPHOS, BILITOT, PROT, ALBUMIN in Ashley last 168 hours. No results for input(s): LIPASE, AMYLASE in Ashley last 168 hours. No results for input(s): AMMONIA in Ashley last 168 hours. Coagulation Profile: Recent Labs  Lab 11/21/18 0943  INR 1.0   Cardiac Enzymes: No results for input(s): CKTOTAL, CKMB, CKMBINDEX, TROPONINI in Ashley last 168 hours. BNP (last 3 results) No results for input(s): PROBNP in Ashley last 8760 hours. HbA1C: No results for input(s): HGBA1C in Ashley last 72 hours. CBG: No results for input(s): GLUCAP in Ashley last 168 hours. Lipid Profile: No results for input(s): CHOL, HDL, LDLCALC, TRIG, CHOLHDL, LDLDIRECT in Ashley last 72 hours. Thyroid Function Tests: No results for input(s): TSH, T4TOTAL, FREET4, T3FREE, THYROIDAB in Ashley last 72 hours. Anemia Panel: No results for input(s): VITAMINB12, FOLATE, FERRITIN, TIBC, IRON, RETICCTPCT in Ashley last 72 hours. Urine analysis:    Component Value Date/Time   COLORURINE STRAW (A) 10/01/2017 1202   APPEARANCEUR CLEAR 10/01/2017 1202   LABSPEC 1.006 10/01/2017 1202   PHURINE 5.0 10/01/2017 1202   GLUCOSEU NEGATIVE 10/01/2017 1202   HGBUR SMALL (A) 10/01/2017 1202   BILIRUBINUR NEGATIVE 10/01/2017 1202   KETONESUR NEGATIVE 10/01/2017 1202   PROTEINUR NEGATIVE 10/01/2017 1202   NITRITE NEGATIVE 10/01/2017 1202   LEUKOCYTESUR NEGATIVE 10/01/2017 1202   Sepsis Labs: Recent Results (from Ashley past 240 hour(s))  SARS CORONAVIRUS 2 Nasal Swab Aptima Multi Swab     Status: None   Collection Time: 11/18/18  7:09 AM   Specimen: Aptima Multi Swab; Nasal Swab  Result Value Ref Range Status   SARS Coronavirus 2 NEGATIVE NEGATIVE Final    Comment: (NOTE) SARS-CoV-2 target nucleic acids are NOT DETECTED. Ashley SARS-CoV-2 RNA is generally detectable in upper and lower respiratory specimens during Ashley acute phase of infection.  Negative results do not preclude SARS-CoV-2 infection, do not rule out co-infections  with other pathogens, and should not be used as Ashley sole basis for treatment or other patient management decisions. Negative results must be combined with clinical observations, patient history, and epidemiological information. Ashley expected result is Negative. Fact Sheet for Patients: SugarRoll.be Fact Sheet for Healthcare Providers: https://www.woods-mathews.com/ This test is not yet approved or cleared by [place] FDA and  has been authorized for detection and/or diagnosis of SARS-CoV-2 by FDA under an Emergency Use Authorization (EUA). This EUA will remain  in effect (meaning this test can be used) for Ashley duration of Ashley COVID-19 declaration under Section 56 4(b)(1) of Ashley Act, 21 U.S.C. section 360bbb-3(b)(1), unless Ashley authorization is terminated or revoked sooner. Performed at [facility], [place] [address], [place], [place] [address]      Radiological Exams on Admission: Dg Chest Port 1 View  Result Date: 11/21/2018 CLINICAL DATA:  Lung collapse. EXAM: PORTABLE CHEST 1 VIEW COMPARISON:  CT 11/13/2018.  12/26/2015. FINDINGS: Mediastinum and hilar structures normal. Cardiomegaly with normal pulmonary vascularity. Density of Ashley right lateral chest again noted. Chronic interstitial prominence again noted. Right chest tube noted. No pneumothorax. Cervicothoracic and thoracolumbar spine fusion. IMPRESSION: 1. Right chest tube noted. No pneumothorax. Persistent density noted over Ashley right lateral chest. Stable changes of chronic interstitial lung disease. 2.  Cardiomegaly.  Normal pulmonary vascularity. Electronically Signed   By: Marcello Moores  Register   On: 11/21/2018 14:14     Assessment/Plan Pneumothorax after biopsy, pulmonary nodules: Acute.  Patient presented for CT-guided biopsy of pulmonary nodules.  Patient was being followed in outpatient  setting by Dr. Kipp Brood of cardiothoracic surgery. -Admit to a progressive bed -Regular diet as tolerated -Continuous pulse oximetry with nasal cannula oxygen -IR to manage chest tube   Emphysema/COPD: Patient without acute exacerbation at this time. -DuoNebs as needed for shortness of breath or wheezing.  Combined systolic and diastolic congestive heart failure: Patient does not appear to be euvolemic.  Chest x-ray noting cardiomegaly without signs of edema.  Last EF noted to be 40 to 45% with grade 1 diastolic dysfunction in 12/1914. -Strict intake and output -Daily weight -Continue furosemide per home regimen  Essential hypertension: Stable -Continue amlodipine  Hyperlipidemia -Continue Crestor  Thrombocytosis: Chronic.  Platelet count mildly elevated at 408 on admission. -Continue to monitor  History of breast cancer: Patient with history of adenocarcinoma Ashley left breast status post resection without radiation treatment back in 1995.  GERD -Continue Protonix  DVT prophylaxis: Lovenox Code Status: Full Family Communication: Discussed plan of care with Ashley patient and daughter present at bedside Disposition Plan: Likely discharge home in 2 to 3 days Consults called: IR Admission status: Inpatient  Norval Morton MD [facility] Pager [phone]   If 7PM-7AM, please contact night-coverage [email or web address] Password [email or web address]  11/21/2018, 2:41 PM

## 2018-11-21 NOTE — Sedation Documentation (Signed)
Pt complains of pain in her chest, she states it is 8/10 at this time. Daughter is at pt bedside. Awaiting MD to speak with pt and daughter. VSS.

## 2018-11-21 NOTE — Procedures (Signed)
Interventional Radiology Procedure:   Indications: Suspicious right lung nodules  Procedure: CT guided right lung nodule biopsy and CT guided right chest tube placement  Findings: Developed pneumothorax after needle placed in pleural based lesion.  Initially, Yueh catheter was placed and pleural air was removed.   CT guided biopsy was completed and 4 cores obtained.  Yueh catheter was exchanged for 12 Fr catheter after biopsy.    Complications: Pneumothorax treated with chest tube placement     EBL: less than 20 ml  Plan: Admit to hospital and chest tube management.    Kriss Ishler R. Anselm Pancoast, MD  Pager: [phone]

## 2018-11-21 NOTE — H&P (Signed)
Chief Complaint: Patient was seen in consultation today for right lung nodule biopsy at the request of [place]  Referring Physician(s): [place]  Supervising Physician: Markus Daft  Patient Status: [facility] - Out-pt  History of Present Illness: Ashley [name] is a 68 y.o. female with hx of breast cancer found to have pulmonary nodules. She is referred for CT guided biopsy PMHx, meds, labs, imaging, allergies reviewed. Feels well, no recent fevers, chills, illness. Has been NPO today as directed.   Past Medical History:  Diagnosis Date  . 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 Arthritis   . Ascending aortic aneurysm ([place])   . Cancer [facility]) 1995   breast, left, mastectomy/chemo  . Chest pain    Possibly cardiac. No evidence of ischemia/injury based upon normal troponin I. Chest discomfort could be tachycardia induced supply demand mismatch.   . CHF (congestive heart failure) ([place]) 11/17/2015   after surgery   . Colon adenomas   . Coronary artery disease   . Dyspnea    with exertion  . Emphysema of lung ([place]) 11/17/2015  . Essential hypertension, benign   . GERD (gastroesophageal reflux disease)   . Hyperlipidemia   . Hypertension   . Melanoma ([place]) 01/09/2016  . Palpitations   . Pernicious anemia 03/06/2016  . Pure hypercholesterolemia   . Thrombocythemia, essential ([place]) 01/09/2016  . Thrombocytosis ([place])    Idiopathic    Past Surgical History:  Procedure Laterality Date  . ANTERIOR AND POSTERIOR REPAIR N/A 12/09/2014   Procedure: ANTERIOR (CYSTOCELE) AND POSTERIOR REPAIR (RECTOCELE);  Surgeon: Bjorn Loser, MD;  Location: [facility];  Service: Urology;  Laterality: N/A;  . ANTERIOR CERVICAL DECOMPRESSION/DISCECTOMY FUSION 4 LEVELS Right 10/03/2016   Procedure: ANTERIOR CERVICAL DECOMPRESSION FUSION, CERVICAL 4-5, CERVICAL 5-6, CERVICAL 6-7, CERVICAL 7 TO THORACIC 1 WITH INSTRUMENTATION AND ALLOGRAFT;  Surgeon: Phylliss Bob, MD;  Location:  [place];  Service: Orthopedics;  Laterality: Right;  ANTERIOR CERVICAL DECOMPRESSION FUSION, CERVICAL 4-5, CERVICAL 5-6, CERVICAL 6-7, CERVICAL 7 TO THORACIC 1 WITH INSTRUMENTATION AND ALLOGRAFT; REQUEST 4 HO  . ANTERIOR LAT LUMBAR FUSION Left 11/16/2015   Procedure: LEFT SIDED LATERAL INTERBODY FUSION, LUMBAR 2-3, LUMBAR 3-4, LUMBAR 4-5 WITH INSTRUMENTATION;  Surgeon: Phylliss Bob, MD;  Location: [place];  Service: Orthopedics;  Laterality: Left;  LEFT SIDED LATEARL INTERBODY FUSION, LUMBAR 2-3, LUMBAR 3-4, LUMBAR 4-5 WITH INSTRUMENTATION   . APPENDECTOMY    . BONE MARROW ASPIRATION  07/2012  . BONE MARROW BIOPSY  07/2012  . BREAST SURGERY    . CARDIAC CATHETERIZATION    . CARDIAC CATHETERIZATION N/A 01/20/2016   Procedure: Left Heart Cath and Coronary Angiography;  Surgeon: Burnell Blanks, MD;  Location: [place] [facility];  Service: Cardiovascular;  Laterality: N/A;  . COLONOSCOPY  11/29/2010   Procedure: COLONOSCOPY;  Surgeon: Rogene Houston, MD;  [facility];  Service: Endoscopy;  Laterality: N/A;  . COLONOSCOPY N/A 02/18/2014   Procedure: COLONOSCOPY;  Surgeon: Rogene Houston, MD;  [facility];  Service: Endoscopy;  Laterality: N/A;  1030  . COLONOSCOPY N/A 02/25/2017   Procedure: COLONOSCOPY;  Surgeon: Rogene Houston, MD;  [facility];  Service: Endoscopy;  Laterality: N/A;  10:55  . CYSTOSCOPY N/A 12/09/2014   Procedure: CYSTOSCOPY;  Surgeon: Bjorn Loser, MD;  Location: [facility];  Service: Urology;  Laterality: N/A;  . ESOPHAGEAL DILATION N/A 02/25/2017   Procedure: ESOPHAGEAL DILATION;  Surgeon: Rogene Houston, MD;  [facility];  Service: Endoscopy;  Laterality: N/A;  . ESOPHAGOGASTRODUODENOSCOPY N/A 02/25/2017   Procedure: ESOPHAGOGASTRODUODENOSCOPY (EGD);  Surgeon: Rogene Houston, MD;  [facility];  Service: Endoscopy;  Laterality: N/A;  . IR GENERIC HISTORICAL  01/11/2016   IR RADIOLOGY PERIPHERAL GUIDED IV START  01/11/2016 Saverio Danker, PA-C [facility]  . IR GENERIC HISTORICAL  01/11/2016   IR US GUIDE VASC ACCESS RIGHT 01/11/2016 Saverio Danker, PA-C [facility]  . MASTECTOMY     left  . OVARIAN CYST SURGERY     x2  . POLYPECTOMY  02/25/2017   Procedure: POLYPECTOMY;  Surgeon: Rogene Houston, MD;  [facility];  Service: Endoscopy;;  . SALPINGOOPHORECTOMY Bilateral 12/09/2014   Procedure: SALPINGO OOPHORECTOMY;  Surgeon: Servando Salina, MD;  Location: [facility];  Service: Gynecology;  Laterality: Bilateral;  . TUBAL LIGATION    . VAGINAL HYSTERECTOMY N/A 12/09/2014   Procedure: HYSTERECTOMY VAGINAL;  Surgeon: Servando Salina, MD;  Location: [facility];  Service: Gynecology;  Laterality: N/A;    Allergies: Penicillins and Tape  Medications: Prior to Admission medications   Medication Sig Start Date End Date Taking? Authorizing Provider  albuterol (PROVENTIL HFA;VENTOLIN HFA) 108 (90 Base) MCG/ACT inhaler Inhale 2 puffs into the lungs every 6 (six) hours as needed for wheezing or shortness of breath. 05/01/17  Yes Mannam, Praveen, MD  amLODipine (NORVASC) 2.5 MG tablet Take 2.5 mg by mouth daily.  05/27/18  Yes [provider]  aspirin EC 81 MG tablet Take 81 mg by mouth daily.   Yes [provider]  Calcium Carb-Cholecalciferol (CALCIUM 600+D) 600-800 MG-UNIT TABS Take 1 tablet by mouth 2 (two) times daily.   Yes [provider]  Cholecalciferol (VITAMIN D-3) 1000 units CAPS Take 1,000 Units daily by mouth.    Yes [provider]  clonazePAM (KLONOPIN) 0.5 MG tablet Take 1 tablet (0.5 mg total) by mouth at bedtime. 10/20/18  Yes Derek Jack, MD  cyanocobalamin (,VITAMIN B-12,) 1000 MCG/ML injection INJECT 1 ML INTO THE MUSCLE ONCE MONTHLY AS DIRECTED. Patient taking differently: Inject 1,000 mcg into the muscle every 30 (thirty) days.  09/26/16  Yes Kefalas, Manon Hilding, PA-C  furosemide (LASIX) 40 MG tablet Take 20-40 mg by mouth See admin  instructions. Take 40 mg in the morning and 20 mg at night 01/16/17  Yes [provider]  gabapentin (NEURONTIN) 300 MG capsule Take 300 mg by mouth at bedtime.   Yes [provider]  hydroxyurea (HYDREA) 500 MG capsule TAKE 1 CAPSULE DAILY TUESDAY-FRIDAY AND 2 CAPSULES ON SATURDAY-MONDAY. Patient taking differently: Take 500-1,000 mg by mouth See admin instructions. Take 500 mg at night on Tue, Wed, Thur, and Fri, take 1000 mg at night on Sat, Sun, and Mon 10/20/18  Yes Derek Jack, MD  ibandronate (BONIVA) 150 MG tablet Take 1 tablet by mouth every 30 (thirty) days. 07/14/12  Yes [provider]  ibuprofen (ADVIL,MOTRIN) 800 MG tablet Take 800 mg every 8 (eight) hours as needed by mouth for moderate pain.   Yes [provider]  ipratropium-albuterol (DUONEB) 0.5-2.5 (3) MG/3ML SOLN Take 3 mLs by nebulization every 6 (six) hours as needed (shortness of breath). 11/21/15  Yes Arrien, Jimmy Picket, MD  methocarbamol (ROBAXIN) 500 MG tablet Take 500 mg by mouth every 6 (six) hours as needed for muscle spasms.    Yes [provider]  nitroGLYCERIN (NITRODUR - DOSED IN MG/24 HR) 0.1 mg/hr patch Place 0.1 mg onto the skin daily.  10/08/18  Yes [provider]  ondansetron (ZOFRAN) 8 MG tablet Take 1 tablet (8 mg total) by mouth every 8 (eight) hours as needed for nausea or vomiting. 08/01/17  Yes Derek Jack, MD  pantoprazole (PROTONIX) 40 MG tablet Take 40 mg daily by mouth.   Yes [provider]  polyethylene glycol (MIRALAX / GLYCOLAX) 17 g packet Take 17 g by mouth daily as needed for mild constipation.   Yes [provider]  potassium chloride SA (K-DUR,KLOR-CON) 20 MEQ tablet Take 1 tablet (20 mEq total) by mouth 3 (three) times daily. Patient taking differently: Take 60 mEq by mouth at bedtime.  02/15/17  Yes Kefalas, Manon Hilding, PA-C  rosuvastatin (CRESTOR) 40 MG tablet Take 40 mg at bedtime by mouth.    Yes [provider]  traMADol (ULTRAM) 50 MG tablet Take 50 mg by mouth at bedtime as needed for moderate pain.  05/02/18  Yes [provider]     Family History  Problem Relation Age of Onset  . Hypertension Mother   . Heart failure Mother   . Congestive Heart Failure Mother   . COPD Mother   . Pernicious anemia Mother   . Cancer Mother        lung  . Hypertension Father   . CAD Father   . Heart attack Father   . Hypertension Sister   . Cancer Other   . Celiac disease Other     Social History   Socioeconomic History  . Marital status: Divorced    Spouse name: Not on file  . Number of children: 2  . Years of education: Not on file  . Highest education level: Not on file  Occupational History  . Not on file  Social Needs  . Financial resource strain: Not on file  . Food insecurity    Worry: Not on file    Inability: Not on file  . Transportation needs    Medical: Not on file    Non-medical: Not on file  Tobacco Use  . Smoking status: Current Every Day Smoker    Packs/day: 0.50    Years: 50.00    Pack years: 25.00    Types: Cigarettes  . Smokeless tobacco: Never Used  Substance and Sexual Activity  . Alcohol use: No  . Drug use: No  . Sexual activity: Yes    Birth control/protection: Post-menopausal  Lifestyle  . Physical activity    Days per week: Not on file    Minutes per session: Not on file  . Stress: Not on file  Relationships  . Social Herbalist on phone: Not on file    Gets together: Not on file    Attends religious service: Not on file    Active member of club or organization: Not on file    Attends meetings of clubs or organizations: Not on file    Relationship status: Not on file  Other Topics Concern  . Not on file  Social History Narrative  . Not on file     Review of Systems: A 12 point ROS discussed and pertinent positives are indicated in the HPI above.  All other systems are negative.  Review of Systems  Vital  Signs: BP (!) 146/91   Pulse 64   Temp (!) 97.2 F (36.2 C) (Temporal)   Ht '5\' 2"'  (1.575 m)   Wt 50.8 kg   SpO2 100%   BMI 20.49 kg/m   Physical Exam  Constitutional:      Appearance: Normal appearance.  HENT:     Head: Normocephalic and atraumatic.     Mouth/Throat:     Mouth: Mucous membranes are moist.     Pharynx: Oropharynx is clear.  Cardiovascular:     Rate and Rhythm: Normal rate and regular rhythm.     Heart sounds: Normal heart sounds.  Pulmonary:     Effort: Pulmonary effort is normal. No respiratory distress.     Breath sounds: Normal breath sounds.  Skin:    General: Skin is warm and dry.  Neurological:     General: No focal deficit present.     Mental Status: She is alert and oriented to person, place, and time.  Psychiatric:        Mood and Affect: Mood normal.        Judgment: Judgment normal.      Imaging: Nm Pet Image Restag (ps) Skull Base To Thigh  Result Date: 10/24/2018 CLINICAL DATA:  Subsequent treatment strategy for new right upper lobe pulmonary nodules. Remote history of breast cancer. EXAM: NUCLEAR MEDICINE PET SKULL BASE TO THIGH TECHNIQUE: 5.5 mCi F-18 FDG was injected intravenously. Full-ring PET imaging was performed from the skull base to thigh after the radiotracer. CT data was obtained and used for attenuation correction and anatomic localization. Fasting blood glucose: 137 mg/dl COMPARISON:  Chest CT 10/14/2018. CTA of the chest, abdomen and pelvis 04/15/2018. FINDINGS: Mediastinal blood pool activity: SUV max 2.2 Liver activity: SUV max NA NECK: No areas of abnormal hypermetabolism. Incidental CT findings: No cervical adenopathy. Left carotid atherosclerosis. CHEST: Hypermetabolism corresponding to the right upper lobe spiculated pulmonary nodule. This measures a 1.0 cm and a S.U.V. max of 8.0 on image 33/8. Just inferior and lateral to this, a pleural-based nodule measures 1.8 cm and a S.U.V. max of 10.0 on 37/8. No thoracic nodal  hypermetabolism. Incidental CT findings: Deferred to recent diagnostic CT. Advanced bullous type emphysema. Other pulmonary nodules detailed on the prior exam are below PET resolution. Ascending aortic dilatation has been detailed previously. Tortuous thoracic aorta. Cardiomegaly. Aortic and coronary artery atherosclerosis. No axillary adenopathy. ABDOMEN/PELVIS: No abdominopelvic parenchymal or nodal hypermetabolism. Incidental CT findings: Right adrenal enlargement is likely related to hyperplasia. Mild chronic left adrenal nodularity. Abdominal aortic atherosclerosis. Low-density bilateral renal lesions are likely cysts. There also subcentimeter hyperattenuating right renal lesions which are indeterminate. Hyperattenuation within the left renal collecting system measures 1.7 cm on image 119/4. This may have been present on prior exams including back to 01/21/2017. Large colonic stool burden. Pelvic floor laxity. Hysterectomy. Pancreatic parenchymal calcifications likely represent chronic calcific pancreatitis. SKELETON: Hypermetabolism about the left facets at approximately the C3-4 level is just cephalad to surgical changes and favored to be degenerative. Incidental CT findings: Cervical and lumbar spine fixation. IMPRESSION: 1. Hypermetabolic right upper lobe pulmonary nodules which are suspicious for synchronous primary bronchogenic carcinomas. Metastatic disease felt less likely. 2. No evidence of thoracic nodal hypermetabolism or typical findings of distant metastasis. 3. Aortic atherosclerosis (ICD10-I70.0), coronary artery atherosclerosis and emphysema (ICD10-J43.9). 4. Ascending aortic aneurysm, as detailed on prior exams. 5. Hyperattenuation in the interpolar left renal collecting system. This is felt to have been present over prior exams, favoring a benign etiology (i.e. The sequelae of prior infection). Consider correlation with urinalysis. Recommend attention on follow-up. Electronically Signed   By:  Abigail Miyamoto M.D.   On: 10/24/2018 16:59    Labs:  CBC: Recent Labs    12/25/17 1339 04/03/18  1336 06/17/18 1054 10/07/18 1116  WBC 7.9 11.4* 5.6 6.5  HGB 11.8* 11.0* 10.8* 11.3*  HCT 36.2 33.3* 34.9* 34.3*  PLT 518* 584* 359 390    COAGS: No results for input(s): INR, APTT in the last 8760 hours.  BMP: Recent Labs    12/25/17 1339 04/03/18 1336 06/17/18 1054 10/07/18 1116 10/07/18 1120  NA 136 138 137 137  --   K 3.3* 3.3* 3.5 3.9  --   CL 102 101 105 100  --   CO2 '26 29 24 26  ' --   GLUCOSE 127* 91 158* 99  --   BUN 14 31* 20 25*  --   CALCIUM 8.8* 8.7* 9.0 9.2  --   CREATININE 1.09* 1.21* 0.95 1.27* 1.90*  GFRNONAA 51* 46* >60 43*  --   GFRAA 59* 53* >60 50*  --     LIVER FUNCTION TESTS: Recent Labs    12/25/17 1339 04/03/18 1336 06/17/18 1054 10/07/18 1116  BILITOT 0.3 0.4 0.5 0.5  AST '26 20 23 24  ' ALT '14 15 12 15  ' ALKPHOS 66 46 57 65  PROT 7.3 6.5 7.3 7.5  ALBUMIN 3.8 3.7 4.0 3.9    TUMOR MARKERS: No results for input(s): AFPTM, CEA, CA199, CHROMGRNA in the last 8760 hours.  Assessment and Plan: Right pulmonary nodules. Hx breast cancer For CT guided right pulm nodule bx Labs pending Risks and benefits of CT guided lung nodule biopsy was discussed with the patient including, but not limited to bleeding, hemoptysis, respiratory failure requiring intubation, infection, pneumothorax requiring chest tube placement, stroke from air embolism or even death.  All of the patient's questions were answered and the patient is agreeable to proceed.  Consent signed and in chart.    Thank you for this interesting consult.  I greatly enjoyed meeting Nya Monds [place] and look forward to participating in their care.  A copy of this report was sent to the requesting provider on this date.  Electronically Signed: Ascencion Dike, PA-C 11/21/2018, 9:51 AM   I spent a total of 25 minutes in face to face in clinical consultation, greater than 50% of which was  counseling/coordinating care for right lung biopsy

## 2018-11-21 NOTE — Plan of Care (Signed)
  Problem: Education: Goal: Knowledge of General Education information will improve Description: Including pain rating scale, medication(s)/side effects and non-pharmacologic comfort measures Outcome: Progressing   Problem: Health Behavior/Discharge Planning: Goal: Ability to manage health-related needs will improve Outcome: Progressing   Problem: Clinical Measurements: Goal: Ability to maintain clinical measurements within normal limits will improve Outcome: Progressing   Problem: Clinical Measurements: Goal: Respiratory complications will improve Outcome: Progressing   Problem: Nutrition: Goal: Adequate nutrition will be maintained Outcome: Progressing   Problem: Activity: Goal: Risk for activity intolerance will decrease Outcome: Progressing   Problem: Pain Managment: Goal: General experience of comfort will improve Outcome: Progressing

## 2018-11-21 NOTE — Progress Notes (Signed)
Patient with pneumothorax after lung biopsy today in IR requiring chest tube placement.  Patient to be admitted by hospitalist service.  Discussed with Dr. Tamala Julian.  IR to follow for management of chest tube.  Chest x-ray to be ordered for tomorrow morning.  Brynda Greathouse, MS RD PA-C 1:18 PM

## 2018-11-21 NOTE — Sedation Documentation (Signed)
Dr Anselm Pancoast in to speak with pt and pt daughter

## 2018-11-22 ENCOUNTER — Inpatient Hospital Stay (HOSPITAL_COMMUNITY): Payer: Medicare Other

## 2018-11-22 LAB — BASIC METABOLIC PANEL
Anion gap: 10 (ref 5–15)
BUN: 12 mg/dL (ref 8–23)
CO2: 23 mmol/L (ref 22–32)
Calcium: 8 mg/dL — ABNORMAL LOW (ref 8.9–10.3)
Chloride: 103 mmol/L (ref 98–111)
Creatinine, Ser: 0.98 mg/dL (ref 0.44–1.00)
GFR calc Af Amer: >60 mL/min (ref >60)
GFR calc non Af Amer: 58 mL/min — ABNORMAL LOW (ref >60)
Glucose, Bld: 97 mg/dL (ref 70–99)
Potassium: 3.9 mmol/L (ref 3.5–5.1)
Sodium: 136 mmol/L (ref 135–145)

## 2018-11-22 LAB — CBC
HCT: 30.2 % — ABNORMAL LOW (ref 36.0–46.0)
Hemoglobin: 10 g/dL — ABNORMAL LOW (ref 12.0–15.0)
MCH: 36.6 pg — ABNORMAL HIGH (ref 26.0–34.0)
MCHC: 33.1 g/dL (ref 30.0–36.0)
MCV: 110.6 fL — ABNORMAL HIGH (ref 80.0–100.0)
Platelets: 304 10*3/uL (ref 150–400)
RBC: 2.73 MIL/uL — ABNORMAL LOW (ref 3.87–5.11)
RDW: 14.2 % (ref 11.5–15.5)
WBC: 4.1 10*3/uL (ref 4.0–10.5)
nRBC: 0 % (ref 0.0–0.2)

## 2018-11-22 NOTE — Progress Notes (Signed)
PROGRESS NOTE    Ashley [name]  YCX:[id number] DOB: Jan 05, 1951 DOA: 11/21/2018 PCP: [name], NP   Brief Narrative: Ashley [name] is a 68 y.o. 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 Patient presented secondary to pneumothorax secondary to biopsy. Chest tube placed.   Assessment & Plan:   Principal Problem:   Pneumothorax after biopsy Active Problems:   Emphysema of lung (HCC)   Thrombocythemia, essential (HCC)   History of breast cancer   Combined systolic and diastolic cardiac dysfunction   Pneumothorax after biopsy Right chest tube placed by IR. Pneumothorax improved.  -Management per IR  COPD Stable. No exacerbation. -Continue Duoneb prn  Combined systolic and diastolic heart failure Stable. -Strict in and out, daily weights -Continue Lasix  Essential hypertension -Continue amlodipine  Hyperlipidemia -Continue Crestor  Thrombocytosis Stable.  History of breat cancer Outpatient follow-up  Anemia Macrocytic. Chronic.  GERD -Continue Protonix   DVT prophylaxis: Lovenox Code Status:   Code Status: Full Code Family Communication: None Disposition Plan: Discharge in 24-48 hours   Consultants:   Interventional radiology  Procedures:   Chest tube  Antimicrobials:  None    Subjective: No chest pain or dyspnea. Some cough but nothing worse than baseline.  Objective: Vitals:   11/22/18 0343 11/22/18 0742 11/22/18 1127 11/22/18 1505  BP: 117/63 136/82 123/77 118/71  Pulse: 60 71 79 (!) 59  Resp: 13 19 15 15   Temp: 97.8 F (36.6 C) 99.2 F (37.3 C) 97.9 F (36.6 C) 98.2 F (36.8 C)  TempSrc: Oral Oral Oral Oral  SpO2: 95% 93% 100% 97%  Weight: 49.8 kg     Height:        Intake/Output Summary (Last 24 hours) at 11/22/2018 1526 Last data filed at 11/22/2018 1300 Gross per 24 hour  Intake 600 ml  Output 400 ml   Net 200 ml   Filed Weights   11/21/18 0924 11/22/18 0343  Weight: 50.8 kg 49.8 kg    Examination:  General exam: Appears calm and comfortable Respiratory system: Clear to auscultation. Respiratory effort normal. Cardiovascular system: S1 & S2 heard, RRR. No murmurs, rubs, gallops or clicks. Gastrointestinal system: Abdomen is nondistended, soft and nontender. No organomegaly or masses felt. Normal bowel sounds heard. Central nervous system: Alert and oriented. No focal neurological deficits. Extremities: No edema. No calf tenderness Skin: No cyanosis. No rashes Psychiatry: Judgement and insight appear normal. Mood & affect appropriate.     Data Reviewed: I have personally reviewed following labs and imaging studies  CBC: Recent Labs  Lab 11/21/18 0943 11/22/18 0225  WBC 6.0 4.1  HGB 12.9 10.0*  HCT 39.4 30.2*  MCV 111.9* 110.6*  PLT 408* 497   Basic Metabolic Panel: Recent Labs  Lab 11/21/18 1655 11/22/18 0225  NA 136 136  K 3.5 3.9  CL 102 103  CO2 24 23  GLUCOSE 118* 97  BUN 13 12  CREATININE 0.91 0.98  CALCIUM 8.1* 8.0*   GFR: Estimated Creatinine Clearance: 42 mL/min (by C-G formula based on SCr of 0.98 mg/dL). Liver Function Tests: No results for input(s): AST, ALT, ALKPHOS, BILITOT, PROT, ALBUMIN in the last 168 hours. No results for input(s): LIPASE, AMYLASE in the last 168 hours. No results for input(s): AMMONIA in the last 168 hours. Coagulation Profile: Recent Labs  Lab 11/21/18 0943  INR 1.0   Cardiac Enzymes: No results for input(s): CKTOTAL,  CKMB, CKMBINDEX, TROPONINI in the last 168 hours. BNP (last 3 results) No results for input(s): PROBNP in the last 8760 hours. HbA1C: No results for input(s): HGBA1C in the last 72 hours. CBG: No results for input(s): GLUCAP in the last 168 hours. Lipid Profile: No results for input(s): CHOL, HDL, LDLCALC, TRIG, CHOLHDL, LDLDIRECT in the last 72 hours. Thyroid Function Tests: No results for  input(s): TSH, T4TOTAL, FREET4, T3FREE, THYROIDAB in the last 72 hours. Anemia Panel: No results for input(s): VITAMINB12, FOLATE, FERRITIN, TIBC, IRON, RETICCTPCT in the last 72 hours. Sepsis Labs: No results for input(s): PROCALCITON, LATICACIDVEN in the last 168 hours.  Recent Results (from the past 240 hour(s))  SARS CORONAVIRUS 2 Nasal Swab Aptima Multi Swab     Status: None   Collection Time: 11/18/18  7:09 AM   Specimen: Aptima Multi Swab; Nasal Swab  Result Value Ref Range Status   SARS Coronavirus 2 NEGATIVE NEGATIVE Final    Comment: (NOTE) SARS-CoV-2 target nucleic acids are NOT DETECTED. The SARS-CoV-2 RNA is generally detectable in upper and lower respiratory specimens during the acute phase of infection. Negative results do not preclude SARS-CoV-2 infection, do not rule out co-infections with other pathogens, and should not be used as the sole basis for treatment or other patient management decisions. Negative results must be combined with clinical observations, patient history, and epidemiological information. The expected result is Negative. Fact Sheet for Patients: SugarRoll.be Fact Sheet for Healthcare Providers: https://www.woods-mathews.com/ This test is not yet approved or cleared by the [place] FDA and  has been authorized for detection and/or diagnosis of SARS-CoV-2 by FDA under an Emergency Use Authorization (EUA). This EUA will remain  in effect (meaning this test can be used) for the duration of the COVID-19 declaration under Section 56 4(b)(1) of the Act, 21 U.S.C. section 360bbb-3(b)(1), unless the authorization is terminated or revoked sooner. Performed at [facility], [place] [address] [place], [place] [address]   MRSA PCR Screening     Status: None   Collection Time: 11/21/18  4:20 PM   Specimen: Nasal Mucosa; Nasopharyngeal  Result Value Ref Range Status   MRSA by PCR NEGATIVE NEGATIVE Final     Comment:        The GeneXpert MRSA Assay (FDA approved for NASAL specimens only), is one component of a comprehensive MRSA colonization surveillance program. It is not intended to diagnose MRSA infection nor to guide or monitor treatment for MRSA infections. Performed at [facility], [place] [address] [place], [place] [address]          Radiology Studies: Dg Chest 2 View  Result Date: 11/22/2018 CLINICAL DATA:  68 year old female with history of CT-guided biopsy of pulmonary nodule complicated by pneumothorax. Follow-up study. EXAM: CHEST - 2 VIEW COMPARISON:  Chest x-ray 11/21/2018. FINDINGS: Right-sided pigtail drainage catheter in position with tip projecting over the mid right hemithorax. Trace residual right apical pneumothorax. Ill-defined opacity in the right mid lung, likely resolving hemorrhage at site of recent biopsy. No other acute consolidative airspace disease. No pleural effusions. No evidence of pulmonary edema. Heart size is normal. Upper mediastinal contours are within normal limits. Aortic atherosclerosis. Orthopedic fixation hardware in the lower cervical spine. Surgical clips in the left axilla likely from prior lymph node dissection. IMPRESSION: 1. Right-sided chest tube has been repositioned slightly with trace residual right apical pneumothorax. 2. Small amount of resolving post biopsy hemorrhage in the right mid lung. 3. Aortic atherosclerosis. Electronically Signed  By: Vinnie Langton M.D.   On: 11/22/2018 10:46   Ct Biopsy  Result Date: 11/21/2018 INDICATION: 68 year old with suspicious right lung nodules and history of breast cancer. Concern for primary lung cancer versus metastatic disease. Tissue diagnosis is needed. EXAM: CT-GUIDED RIGHT LUNG NODULE BIOPSY CT-GUIDED RIGHT CHEST TUBE PLACEMENT MEDICATIONS: None. ANESTHESIA/SEDATION: Moderate (conscious) sedation was employed during this procedure. A total of Versed 3.0 mg and Fentanyl 150 mcg was  administered intravenously. Moderate Sedation Time: 47 minutes. The patient's level of consciousness and vital signs were monitored continuously by radiology nursing throughout the procedure under my direct supervision. FLUOROSCOPY TIME:  None COMPLICATIONS: SIR LEVEL C - Requires therapy, minor hospitalization (<48 hrs). Pneumothorax and chest tube placement. PROCEDURE: Informed written consent was obtained from the patient after a thorough discussion of the procedural risks, benefits and alternatives. All questions were addressed. A timeout was performed prior to the initiation of the procedure. Patient was placed on the CT scanner. The right side was mildly elevated. CT images through the chest were obtained. Pleural-based lesion in the right upper lobe was targeted. Overlying skin was prepped with chlorhexidine and sterile field was created. Skin was anesthetized with 1% lidocaine. Using CT guidance, 17 gauge coaxial needle was directed into the pleural-based lesion. Needle position was confirmed within the lesion and core biopsy was attempted. No tissue was collected on the initial core biopsy and follow up CT images demonstrate the patient had developed a pneumothorax. Due to the pneumothorax, a Yueh catheter was directed into the pleural space and the air was aspirated. 17 gauge needle was then directed back at towards the pleural-based lesion using CT guidance. Needle position was confirmed within the lesion. A total of 4 core biopsies were obtained with an 18 gauge device. In order to perform the biopsy, air was repeatedly removed from the pleural space. Patient was felt to need a chest tube following the procedure. The 17 gauge needle was removed. Yueh catheter was exchanged for a 12 [place] multipurpose drain over a stiff Amplatz wire. Follow up CT images confirmed placement in the pleural space. Catheter was sutured to skin and attached to a PleurEvac device. FINDINGS: Emphysema with indeterminate nodules  in the right upper lobe. Pleural-based lesion in the right upper lobe was targeted. Needle position was confirmed within the lesion and patient had developed a small pneumothorax. Pneumothorax enlarged while trying to obtain the core biopsies. Four adequate core biopsies were obtained. Twelve French pleural catheter was confirmed within the pleural space at the end of the procedure. IMPRESSION: 1. CT-guided biopsy of the right upper lobe pulmonary nodule was complicated by a pneumothorax. The CT-guided biopsy was successful and a 12 French chest tube was placed at the end of the procedure. 2. Patient will be admitted for pneumothorax and chest tube management. Electronically Signed   By: Markus Daft M.D.   On: 11/21/2018 17:41   Dg Chest Port 1 View  Result Date: 11/22/2018 CLINICAL DATA:  Follow-up pneumothorax. EXAM: PORTABLE CHEST 1 VIEW COMPARISON:  11/22/2018 at 8:29 a.m. FINDINGS: A RIGHT thoracostomy tube is again noted. Small RIGHT apical pneumothorax is unchanged. The cardiomediastinal silhouette is unremarkable. Mid RIGHT lung opacity is unchanged. No evidence of LEFT pneumothorax or definite pleural effusion. IMPRESSION: Unchanged appearance of the chest with RIGHT thoracostomy tube and small RIGHT apical pneumothorax. Electronically Signed   By: Margarette Canada M.D.   On: 11/22/2018 13:11   Dg Chest Port 1 View  Result Date: 11/21/2018 CLINICAL DATA:  Lung collapse. EXAM: PORTABLE CHEST 1 VIEW COMPARISON:  CT 11/13/2018.  12/26/2015. FINDINGS: Mediastinum and hilar structures normal. Cardiomegaly with normal pulmonary vascularity. Density of the right lateral chest again noted. Chronic interstitial prominence again noted. Right chest tube noted. No pneumothorax. Cervicothoracic and thoracolumbar spine fusion. IMPRESSION: 1. Right chest tube noted. No pneumothorax. Persistent density noted over the right lateral chest. Stable changes of chronic interstitial lung disease. 2.  Cardiomegaly.  Normal  pulmonary vascularity. Electronically Signed   By: Marcello Moores  Register   On: 11/21/2018 14:14   Ct Perc Pleural Drain Genia Harold Cath W/img Guide  Result Date: 11/21/2018 INDICATION: 68 year old with suspicious right lung nodules and history of breast cancer. Concern for primary lung cancer versus metastatic disease. Tissue diagnosis is needed. EXAM: CT-GUIDED RIGHT LUNG NODULE BIOPSY CT-GUIDED RIGHT CHEST TUBE PLACEMENT MEDICATIONS: None. ANESTHESIA/SEDATION: Moderate (conscious) sedation was employed during this procedure. A total of Versed 3.0 mg and Fentanyl 150 mcg was administered intravenously. Moderate Sedation Time: 47 minutes. The patient's level of consciousness and vital signs were monitored continuously by radiology nursing throughout the procedure under my direct supervision. FLUOROSCOPY TIME:  None COMPLICATIONS: SIR LEVEL C - Requires therapy, minor hospitalization (<48 hrs). Pneumothorax and chest tube placement. PROCEDURE: Informed written consent was obtained from the patient after a thorough discussion of the procedural risks, benefits and alternatives. All questions were addressed. A timeout was performed prior to the initiation of the procedure. Patient was placed on the CT scanner. The right side was mildly elevated. CT images through the chest were obtained. Pleural-based lesion in the right upper lobe was targeted. Overlying skin was prepped with chlorhexidine and sterile field was created. Skin was anesthetized with 1% lidocaine. Using CT guidance, 17 gauge coaxial needle was directed into the pleural-based lesion. Needle position was confirmed within the lesion and core biopsy was attempted. No tissue was collected on the initial core biopsy and follow up CT images demonstrate the patient had developed a pneumothorax. Due to the pneumothorax, a Yueh catheter was directed into the pleural space and the air was aspirated. 17 gauge needle was then directed back at towards the pleural-based  lesion using CT guidance. Needle position was confirmed within the lesion. A total of 4 core biopsies were obtained with an 18 gauge device. In order to perform the biopsy, air was repeatedly removed from the pleural space. Patient was felt to need a chest tube following the procedure. The 17 gauge needle was removed. Yueh catheter was exchanged for a 12 [place] multipurpose drain over a stiff Amplatz wire. Follow up CT images confirmed placement in the pleural space. Catheter was sutured to skin and attached to a PleurEvac device. FINDINGS: Emphysema with indeterminate nodules in the right upper lobe. Pleural-based lesion in the right upper lobe was targeted. Needle position was confirmed within the lesion and patient had developed a small pneumothorax. Pneumothorax enlarged while trying to obtain the core biopsies. Four adequate core biopsies were obtained. Twelve French pleural catheter was confirmed within the pleural space at the end of the procedure. IMPRESSION: 1. CT-guided biopsy of the right upper lobe pulmonary nodule was complicated by a pneumothorax. The CT-guided biopsy was successful and a 12 French chest tube was placed at the end of the procedure. 2. Patient will be admitted for pneumothorax and chest tube management. Electronically Signed   By: Markus Daft M.D.   On: 11/21/2018 17:41        Scheduled Meds: . amLODipine  2.5 mg Oral Daily  .  aspirin EC  81 mg Oral Daily  . clonazePAM  0.5 mg Oral QHS  . enoxaparin (LOVENOX) injection  40 mg Subcutaneous Q24H  . furosemide  20 mg Oral q1800  . furosemide  40 mg Oral Q24H  . gabapentin  300 mg Oral QHS  . hydroxyurea  1,000 mg Oral Once per day on Sun Mon Sat  . hydroxyurea  500 mg Oral Once per day on Tue Wed Thu Fri  . nitroGLYCERIN  0.1 mg Transdermal Daily  . pantoprazole  40 mg Oral Daily  . potassium chloride SA  60 mEq Oral QHS  . rosuvastatin  40 mg Oral q1800   Continuous Infusions: . sodium chloride       LOS: 1 day      Cordelia Poche, MD [facility] 11/22/2018, 3:26 PM  If 7PM-7AM, please contact night-coverage [email or web address]

## 2018-11-22 NOTE — Progress Notes (Signed)
Referring Physician(s): [place]  Supervising Physician: Corrie Mckusick  Patient Status:  [facility] - In-pt  Chief Complaint: "When can I go home"  Subjective:  Right lung nodule biopsy with subsequent development of right pneumothorax s/p right chest tube placement 11/21/2018 by Dr. Anselm Pancoast. Patient awake and alert laying in bed. Eager to go home. Right chest tube site c/d/i. Sating at 96% on RA.  CXR this AM: 1. Right-sided chest tube has been repositioned slightly with trace residual right apical pneumothorax. 2. Small amount of resolving post biopsy hemorrhage in the right mid lung. 3. Aortic atherosclerosis.   Allergies: Penicillins and Tape  Medications: Prior to Admission medications   Medication Sig Start Date End Date Taking? Authorizing Provider  amLODipine (NORVASC) 2.5 MG tablet Take 2.5 mg by mouth daily.  05/27/18  Yes [provider]  aspirin EC 81 MG tablet Take 81 mg by mouth daily.   Yes [provider]  Calcium Carb-Cholecalciferol (CALCIUM 600+D) 600-800 MG-UNIT TABS Take 1 tablet by mouth 2 (two) times daily.   Yes [provider]  Cholecalciferol (VITAMIN D-3) 1000 units CAPS Take 1,000 Units daily by mouth.    Yes [provider]  clonazePAM (KLONOPIN) 0.5 MG tablet Take 1 tablet (0.5 mg total) by mouth at bedtime. 10/20/18  Yes Derek Jack, MD  cyanocobalamin (,VITAMIN B-12,) 1000 MCG/ML injection INJECT 1 ML INTO THE MUSCLE ONCE MONTHLY AS DIRECTED. Patient taking differently: Inject 1,000 mcg into the muscle every 30 (thirty) days.  09/26/16  Yes Kefalas, Manon Hilding, PA-C  furosemide (LASIX) 40 MG tablet Take 20-40 mg by mouth See admin instructions. Take 40 mg in the morning and 20 mg at night 01/16/17  Yes [provider]  gabapentin (NEURONTIN) 300 MG capsule Take 300 mg by mouth at bedtime.   Yes [provider]  hydroxyurea (HYDREA) 500 MG capsule TAKE 1 CAPSULE DAILY TUESDAY-FRIDAY AND 2  CAPSULES ON SATURDAY-MONDAY. Patient taking differently: Take 500-1,000 mg by mouth See admin instructions. Take 500 mg at night on Tue, Wed, Thur, and Fri, take 1000 mg at night on Sat, Sun, and Mon 10/20/18  Yes Derek Jack, MD  ibandronate (BONIVA) 150 MG tablet Take 1 tablet by mouth every 30 (thirty) days. 07/14/12  Yes [provider]  ibuprofen (ADVIL,MOTRIN) 800 MG tablet Take 800 mg every 8 (eight) hours as needed by mouth for moderate pain.   Yes [provider]  methocarbamol (ROBAXIN) 500 MG tablet Take 500 mg by mouth every 6 (six) hours as needed for muscle spasms.    Yes [provider]  nitroGLYCERIN (NITRODUR - DOSED IN MG/24 HR) 0.1 mg/hr patch Place 0.1 mg onto the skin daily.  10/08/18  Yes [provider]  ondansetron (ZOFRAN) 8 MG tablet Take 1 tablet (8 mg total) by mouth every 8 (eight) hours as needed for nausea or vomiting. 08/01/17  Yes Derek Jack, MD  pantoprazole (PROTONIX) 40 MG tablet Take 40 mg daily by mouth.   Yes [provider]  polyethylene glycol (MIRALAX / GLYCOLAX) 17 g packet Take 17 g by mouth daily as needed for mild constipation.   Yes [provider]  potassium chloride SA (K-DUR,KLOR-CON) 20 MEQ tablet Take 1 tablet (20 mEq total) by mouth 3 (three) times daily. Patient taking differently: Take 60 mEq by mouth at bedtime.  02/15/17  Yes Kefalas, Manon Hilding, PA-C  rosuvastatin (CRESTOR) 40 MG tablet Take 40 mg at bedtime by mouth.    Yes [provider]  traMADol (ULTRAM) 50 MG tablet Take 50 mg by mouth at bedtime as needed for moderate pain.  05/02/18  Yes [provider]  albuterol (PROVENTIL HFA;VENTOLIN HFA) 108 (90 Base) MCG/ACT inhaler Inhale 2 puffs into the lungs every 6 (six) hours as needed for wheezing or shortness of breath. 05/01/17   Mannam, Hart Robinsons, MD  ipratropium-albuterol (DUONEB) 0.5-2.5 (3) MG/3ML SOLN Take 3 mLs by nebulization every 6 (six) hours as needed  (shortness of breath). 11/21/15   Arrien, Jimmy Picket, MD     Vital Signs: BP 136/82 (BP Location: Right Wrist)    Pulse 71    Temp 99.2 F (37.3 C) (Oral)    Resp 19    Ht 5\' 2"  (1.575 m)    Wt 109 lb 12.6 oz (49.8 kg)    SpO2 93%    BMI 20.08 kg/m   Physical Exam Vitals signs and nursing note reviewed.  Constitutional:      General: She is not in acute distress.    Appearance: Normal appearance.  Pulmonary:     Effort: Pulmonary effort is normal. No respiratory distress.     Comments: Right chest tube site without tenderness, erythema, drainage, or active bleeding; minimal output of blood tinged fluid in Pleur-evac; tube to suction with (-) air leak. Skin:    General: Skin is warm and dry.  Neurological:     Mental Status: She is alert and oriented to person, place, and time.  Psychiatric:        Mood and Affect: Mood normal.        Behavior: Behavior normal.        Thought Content: Thought content normal.        Judgment: Judgment normal.     Imaging: Ct Biopsy  Result Date: 11/21/2018 INDICATION: 68 year old with suspicious right lung nodules and history of breast cancer. Concern for primary lung cancer versus metastatic disease. Tissue diagnosis is needed. EXAM: CT-GUIDED RIGHT LUNG NODULE BIOPSY CT-GUIDED RIGHT CHEST TUBE PLACEMENT MEDICATIONS: None. ANESTHESIA/SEDATION: Moderate (conscious) sedation was employed during this procedure. A total of Versed 3.0 mg and Fentanyl 150 mcg was administered intravenously. Moderate Sedation Time: 47 minutes. The patient's level of consciousness and vital signs were monitored continuously by radiology nursing throughout the procedure under my direct supervision. FLUOROSCOPY TIME:  None COMPLICATIONS: SIR LEVEL C - Requires therapy, minor hospitalization (<48 hrs). Pneumothorax and chest tube placement. PROCEDURE: Informed written consent was obtained from the patient after a thorough discussion of the procedural risks, benefits and  alternatives. All questions were addressed. A timeout was performed prior to the initiation of the procedure. Patient was placed on the CT scanner. The right side was mildly elevated. CT images through the chest were obtained. Pleural-based lesion in the right upper lobe was targeted. Overlying skin was prepped with chlorhexidine and sterile field was created. Skin was anesthetized with 1% lidocaine. Using CT guidance, 17 gauge coaxial needle was directed into the pleural-based lesion. Needle position was confirmed within the lesion and core biopsy was attempted. No tissue was collected on the initial core biopsy and follow up CT images demonstrate the patient had developed a pneumothorax. Due to the pneumothorax, a Yueh catheter was directed into the pleural space and the air was aspirated. 17 gauge needle was then directed back at towards the pleural-based lesion using CT guidance. Needle position was confirmed within the lesion. A total of 4 core biopsies were obtained with an 18 gauge device. In order to perform  the biopsy, air was repeatedly removed from the pleural space. Patient was felt to need a chest tube following the procedure. The 17 gauge needle was removed. Yueh catheter was exchanged for a 12 [place] multipurpose drain over a stiff Amplatz wire. Follow up CT images confirmed placement in the pleural space. Catheter was sutured to skin and attached to a PleurEvac device. FINDINGS: Emphysema with indeterminate nodules in the right upper lobe. Pleural-based lesion in the right upper lobe was targeted. Needle position was confirmed within the lesion and patient had developed a small pneumothorax. Pneumothorax enlarged while trying to obtain the core biopsies. Four adequate core biopsies were obtained. Twelve French pleural catheter was confirmed within the pleural space at the end of the procedure. IMPRESSION: 1. CT-guided biopsy of the right upper lobe pulmonary nodule was complicated by a pneumothorax.  The CT-guided biopsy was successful and a 12 French chest tube was placed at the end of the procedure. 2. Patient will be admitted for pneumothorax and chest tube management. Electronically Signed   By: Markus Daft M.D.   On: 11/21/2018 17:41   Dg Chest Port 1 View  Result Date: 11/21/2018 CLINICAL DATA:  Lung collapse. EXAM: PORTABLE CHEST 1 VIEW COMPARISON:  CT 11/13/2018.  12/26/2015. FINDINGS: Mediastinum and hilar structures normal. Cardiomegaly with normal pulmonary vascularity. Density of the right lateral chest again noted. Chronic interstitial prominence again noted. Right chest tube noted. No pneumothorax. Cervicothoracic and thoracolumbar spine fusion. IMPRESSION: 1. Right chest tube noted. No pneumothorax. Persistent density noted over the right lateral chest. Stable changes of chronic interstitial lung disease. 2.  Cardiomegaly.  Normal pulmonary vascularity. Electronically Signed   By: Marcello Moores  Register   On: 11/21/2018 14:14   Ct Perc Pleural Drain Genia Harold Cath W/img Guide  Result Date: 11/21/2018 INDICATION: 68 year old with suspicious right lung nodules and history of breast cancer. Concern for primary lung cancer versus metastatic disease. Tissue diagnosis is needed. EXAM: CT-GUIDED RIGHT LUNG NODULE BIOPSY CT-GUIDED RIGHT CHEST TUBE PLACEMENT MEDICATIONS: None. ANESTHESIA/SEDATION: Moderate (conscious) sedation was employed during this procedure. A total of Versed 3.0 mg and Fentanyl 150 mcg was administered intravenously. Moderate Sedation Time: 47 minutes. The patient's level of consciousness and vital signs were monitored continuously by radiology nursing throughout the procedure under my direct supervision. FLUOROSCOPY TIME:  None COMPLICATIONS: SIR LEVEL C - Requires therapy, minor hospitalization (<48 hrs). Pneumothorax and chest tube placement. PROCEDURE: Informed written consent was obtained from the patient after a thorough discussion of the procedural risks, benefits and  alternatives. All questions were addressed. A timeout was performed prior to the initiation of the procedure. Patient was placed on the CT scanner. The right side was mildly elevated. CT images through the chest were obtained. Pleural-based lesion in the right upper lobe was targeted. Overlying skin was prepped with chlorhexidine and sterile field was created. Skin was anesthetized with 1% lidocaine. Using CT guidance, 17 gauge coaxial needle was directed into the pleural-based lesion. Needle position was confirmed within the lesion and core biopsy was attempted. No tissue was collected on the initial core biopsy and follow up CT images demonstrate the patient had developed a pneumothorax. Due to the pneumothorax, a Yueh catheter was directed into the pleural space and the air was aspirated. 17 gauge needle was then directed back at towards the pleural-based lesion using CT guidance. Needle position was confirmed within the lesion. A total of 4 core biopsies were obtained with an 18 gauge device. In order to perform the biopsy, air  was repeatedly removed from the pleural space. Patient was felt to need a chest tube following the procedure. The 17 gauge needle was removed. Yueh catheter was exchanged for a 12 [place] multipurpose drain over a stiff Amplatz wire. Follow up CT images confirmed placement in the pleural space. Catheter was sutured to skin and attached to a PleurEvac device. FINDINGS: Emphysema with indeterminate nodules in the right upper lobe. Pleural-based lesion in the right upper lobe was targeted. Needle position was confirmed within the lesion and patient had developed a small pneumothorax. Pneumothorax enlarged while trying to obtain the core biopsies. Four adequate core biopsies were obtained. Twelve French pleural catheter was confirmed within the pleural space at the end of the procedure. IMPRESSION: 1. CT-guided biopsy of the right upper lobe pulmonary nodule was complicated by a pneumothorax.  The CT-guided biopsy was successful and a 12 French chest tube was placed at the end of the procedure. 2. Patient will be admitted for pneumothorax and chest tube management. Electronically Signed   By: Markus Daft M.D.   On: 11/21/2018 17:41    Labs:  CBC: Recent Labs    06/17/18 1054 10/07/18 1116 11/21/18 0943 11/22/18 0225  WBC 5.6 6.5 6.0 4.1  HGB 10.8* 11.3* 12.9 10.0*  HCT 34.9* 34.3* 39.4 30.2*  PLT 359 390 408* 304    COAGS: Recent Labs    11/21/18 0943  INR 1.0    BMP: Recent Labs    06/17/18 1054 10/07/18 1116 10/07/18 1120 11/21/18 1655 11/22/18 0225  NA 137 137  --  136 136  K 3.5 3.9  --  3.5 3.9  CL 105 100  --  102 103  CO2 24 26  --  24 23  GLUCOSE 158* 99  --  118* 97  BUN 20 25*  --  13 12  CALCIUM 9.0 9.2  --  8.1* 8.0*  CREATININE 0.95 1.27* 1.90* 0.91 0.98  GFRNONAA >60 43*  --  >60 58*  GFRAA >60 50*  --  >60 >60    LIVER FUNCTION TESTS: Recent Labs    12/25/17 1339 04/03/18 1336 06/17/18 1054 10/07/18 1116  BILITOT 0.3 0.4 0.5 0.5  AST 26 20 23 24   ALT 14 15 12 15   ALKPHOS 66 46 57 65  PROT 7.3 6.5 7.3 7.5  ALBUMIN 3.8 3.7 4.0 3.9    Assessment and Plan:  Right lung nodule biopsy with subsequent development of right pneumothorax s/p right chest tube placement 11/21/2018 by Dr. Anselm Pancoast. Right chest tube stable- tube to suction, (-) air leak, sating at 96% on RA. Further plans per [facility]- appreciate and agree with management. IR to follow.   Electronically Signed: Earley Abide, PA-C 11/22/2018, 10:30 AM   I spent a total of 25 Minutes at the the patient's bedside AND on the patient's hospital floor or unit, greater than 50% of which was counseling/coordinating care for right pneumothorax s/p right chest tube placement.

## 2018-11-23 ENCOUNTER — Encounter (HOSPITAL_COMMUNITY): Payer: Self-pay

## 2018-11-23 ENCOUNTER — Inpatient Hospital Stay (HOSPITAL_COMMUNITY): Payer: Medicare Other

## 2018-11-23 NOTE — Discharge Summary (Signed)
Physician Discharge Summary  Ashley [name] KCL:[id number] DOB: [birth year] DOA: 11/21/2018  PCP: [name], NP  Admit date: 11/21/2018 Discharge date: 11/23/2018  Admitted From: home Disposition: Home  Recommendations for Outpatient Follow-up:  1. Follow up with PCP in 1 week 2. Please obtain BMP/CBC in one week 3. Please follow up on the following pending results: None  Home Health: None Equipment/Devices: None  Discharge Condition: Stable CODE STATUS: Full code Diet recommendation: Heart healthy   Brief/Interim Summary:  Admission HPI written by Norval Morton, MD   HPI: Ashley [name] is a 68 y.o. 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  She had been being evaluated by Dr. Kipp Brood of cardiothoracic surgery for his right upper lobe pulmonary nodules.  She had previously been treated with mastectomy for adenocarcinoma of the left breast in 1995.  At baseline patient does not require oxygen.  She does complain of having a chronic cough, but still continues to smoke cigarettes on a regular basis.  Patient reports having previous back surgery 3 years ago and losing around 60 pounds.  She reports that she was able to gain a little bit of her weight back and now currently weighs around 112 pounds.  Today, she underwent CT-guided biopsy of the pulmonary nodules, but suffered a pneumothorax.  Chest tube was placed.   Hospital course:  Pneumothorax after biopsy Right chest tube placed by IR. Pneumothorax improved. Placed on waterseal day prior to discharge. Repeat chest x-ray remained stable with a minimal apical pneumothorax and chest tube removed per IR. Cleared stable to discharge from IR standpoint. -Management per IR  COPD Stable. No exacerbation.  Combined systolic and  diastolic heart failure Stable. Continued Lasix  Essential hypertension Continued amlodipine  Hyperlipidemia Continued Crestor  Thrombocytosis Stable.  History of breat cancer Outpatient follow-up  Anemia Macrocytic. Chronic.  GERD Continued Protonix  Discharge Diagnoses:  Principal Problem:   Pneumothorax after biopsy Active Problems:   Emphysema of lung (HCC)   Thrombocythemia, essential (HCC)   History of breast cancer   Combined systolic and diastolic cardiac dysfunction    Discharge Instructions  Discharge Instructions    Call MD for:  difficulty breathing, headache or visual disturbances   Complete by: As directed    Call MD for:  severe uncontrolled pain   Complete by: As directed    Increase activity slowly   Complete by: As directed      Allergies as of 11/23/2018      Reactions   Penicillins Hives, Rash   Did it involve swelling of the face/tongue/throat, SOB, or low BP? No Did it involve sudden or severe rash/hives, skin peeling, or any reaction on the inside of your mouth or nose? No Did you need to seek medical attention at a hospital or doctor's office? No When did it last happen?5-10 year If all above answers are NO, may proceed with cephalosporin use.   Tape Rash   Medipore, Coban, and paper tape CAN be tolerated      Medication List    TAKE these medications   albuterol 108 (90 Base) MCG/ACT inhaler Commonly known as: VENTOLIN HFA Inhale 2 puffs into the lungs every 6 (six) hours as needed for wheezing or shortness of breath.   amLODipine 2.5 MG tablet Commonly known as: NORVASC Take 2.5  mg by mouth daily.   aspirin EC 81 MG tablet Take 81 mg by mouth daily.   Calcium 600+D 600-800 MG-UNIT Tabs Generic drug: Calcium Carb-Cholecalciferol Take 1 tablet by mouth 2 (two) times daily.   clonazePAM 0.5 MG tablet Commonly known as: KLONOPIN Take 1 tablet (0.5 mg total) by mouth at bedtime.   cyanocobalamin 1000 MCG/ML  injection Commonly known as: (VITAMIN B-12) INJECT 1 ML INTO THE MUSCLE ONCE MONTHLY AS DIRECTED. What changed: See the new instructions.   furosemide 40 MG tablet Commonly known as: LASIX Take 20-40 mg by mouth See admin instructions. Take 40 mg in the morning and 20 mg at night   gabapentin 300 MG capsule Commonly known as: NEURONTIN Take 300 mg by mouth at bedtime.   hydroxyurea 500 MG capsule Commonly known as: HYDREA TAKE 1 CAPSULE DAILY TUESDAY-FRIDAY AND 2 CAPSULES ON SATURDAY-MONDAY. What changed:   how much to take  how to take this  when to take this  additional instructions   ibandronate 150 MG tablet Commonly known as: BONIVA Take 1 tablet by mouth every 30 (thirty) days.   ibuprofen 800 MG tablet Commonly known as: ADVIL Take 800 mg every 8 (eight) hours as needed by mouth for moderate pain.   ipratropium-albuterol 0.5-2.5 (3) MG/3ML Soln Commonly known as: DUONEB Take 3 mLs by nebulization every 6 (six) hours as needed (shortness of breath).   methocarbamol 500 MG tablet Commonly known as: ROBAXIN Take 500 mg by mouth every 6 (six) hours as needed for muscle spasms.   nitroGLYCERIN 0.1 mg/hr patch Commonly known as: NITRODUR - Dosed in mg/24 hr Place 0.1 mg onto the skin daily.   ondansetron 8 MG tablet Commonly known as: ZOFRAN Take 1 tablet (8 mg total) by mouth every 8 (eight) hours as needed for nausea or vomiting.   pantoprazole 40 MG tablet Commonly known as: PROTONIX Take 40 mg daily by mouth.   polyethylene glycol 17 g packet Commonly known as: MIRALAX / GLYCOLAX Take 17 g by mouth daily as needed for mild constipation.   potassium chloride SA 20 MEQ tablet Commonly known as: K-DUR Take 1 tablet (20 mEq total) by mouth 3 (three) times daily. What changed:   how much to take  when to take this   rosuvastatin 40 MG tablet Commonly known as: CRESTOR Take 40 mg at bedtime by mouth.   traMADol 50 MG tablet Commonly known as:  ULTRAM Take 50 mg by mouth at bedtime as needed for moderate pain.   Vitamin D-3 25 MCG (1000 UT) Caps Take 1,000 Units daily by mouth.      Follow-up Information    [name], NP. Schedule an appointment as soon as possible for a visit in 1 week(s).   Specialty: Nurse Practitioner Why: Hospital follow-up Contact information: [address] [place] [address] [phone]          Allergies  Allergen Reactions   Penicillins Hives and Rash    Did it involve swelling of the face/tongue/throat, SOB, or low BP? No Did it involve sudden or severe rash/hives, skin peeling, or any reaction on the inside of your mouth or nose? No Did you need to seek medical attention at a hospital or doctor's office? No When did it last happen?5-10 year If all above answers are NO, may proceed with cephalosporin use.     Tape Rash    Medipore, Coban, and paper tape CAN be tolerated    Consultations:  Interventional radiology  Procedures/Studies: Dg Chest 2 View  Result Date: 11/22/2018 CLINICAL DATA:  68 year old female with history of CT-guided biopsy of pulmonary nodule complicated by pneumothorax. Follow-up study. EXAM: CHEST - 2 VIEW COMPARISON:  Chest x-ray 11/21/2018. FINDINGS: Right-sided pigtail drainage catheter in position with tip projecting over the mid right hemithorax. Trace residual right apical pneumothorax. Ill-defined opacity in the right mid lung, likely resolving hemorrhage at site of recent biopsy. No other acute consolidative airspace disease. No pleural effusions. No evidence of pulmonary edema. Heart size is normal. Upper mediastinal contours are within normal limits. Aortic atherosclerosis. Orthopedic fixation hardware in the lower cervical spine. Surgical clips in the left axilla likely from prior lymph node dissection. IMPRESSION: 1. Right-sided chest tube has been repositioned slightly with trace residual right apical pneumothorax. 2. Small amount of  resolving post biopsy hemorrhage in the right mid lung. 3. Aortic atherosclerosis. Electronically Signed   By: Vinnie Langton M.D.   On: 11/22/2018 10:46   Ct Biopsy  Result Date: 11/21/2018 INDICATION: 68 year old with suspicious right lung nodules and history of breast cancer. Concern for primary lung cancer versus metastatic disease. Tissue diagnosis is needed. EXAM: CT-GUIDED RIGHT LUNG NODULE BIOPSY CT-GUIDED RIGHT CHEST TUBE PLACEMENT MEDICATIONS: None. ANESTHESIA/SEDATION: Moderate (conscious) sedation was employed during this procedure. A total of Versed 3.0 mg and Fentanyl 150 mcg was administered intravenously. Moderate Sedation Time: 47 minutes. The patient's level of consciousness and vital signs were monitored continuously by radiology nursing throughout the procedure under my direct supervision. FLUOROSCOPY TIME:  None COMPLICATIONS: SIR LEVEL C - Requires therapy, minor hospitalization (<48 hrs). Pneumothorax and chest tube placement. PROCEDURE: Informed written consent was obtained from the patient after a thorough discussion of the procedural risks, benefits and alternatives. All questions were addressed. A timeout was performed prior to the initiation of the procedure. Patient was placed on the CT scanner. The right side was mildly elevated. CT images through the chest were obtained. Pleural-based lesion in the right upper lobe was targeted. Overlying skin was prepped with chlorhexidine and sterile field was created. Skin was anesthetized with 1% lidocaine. Using CT guidance, 17 gauge coaxial needle was directed into the pleural-based lesion. Needle position was confirmed within the lesion and core biopsy was attempted. No tissue was collected on the initial core biopsy and follow up CT images demonstrate the patient had developed a pneumothorax. Due to the pneumothorax, a Yueh catheter was directed into the pleural space and the air was aspirated. 17 gauge needle was then directed back at  towards the pleural-based lesion using CT guidance. Needle position was confirmed within the lesion. A total of 4 core biopsies were obtained with an 18 gauge device. In order to perform the biopsy, air was repeatedly removed from the pleural space. Patient was felt to need a chest tube following the procedure. The 17 gauge needle was removed. Yueh catheter was exchanged for a 12 [place] multipurpose drain over a stiff Amplatz wire. Follow up CT images confirmed placement in the pleural space. Catheter was sutured to skin and attached to a PleurEvac device. FINDINGS: Emphysema with indeterminate nodules in the right upper lobe. Pleural-based lesion in the right upper lobe was targeted. Needle position was confirmed within the lesion and patient had developed a small pneumothorax. Pneumothorax enlarged while trying to obtain the core biopsies. Four adequate core biopsies were obtained. Twelve French pleural catheter was confirmed within the pleural space at the end of the procedure. IMPRESSION: 1. CT-guided biopsy of the right upper lobe pulmonary  nodule was complicated by a pneumothorax. The CT-guided biopsy was successful and a 12 French chest tube was placed at the end of the procedure. 2. Patient will be admitted for pneumothorax and chest tube management. Electronically Signed   By: Markus Daft M.D.   On: 11/21/2018 17:41   Dg Chest Port 1 View  Result Date: 11/23/2018 CLINICAL DATA:  68 year old female with history of right-sided pneumothorax. Status post chest tube placement. EXAM: PORTABLE CHEST 1 VIEW COMPARISON:  Chest x-ray 11/22/2018. FINDINGS: Small bore pigtail drainage catheter in the mid right hemithorax, similar to the prior study. Decreased size of trace right apical pneumothorax, now occupying less than 5% of the volume of the right hemithorax. Ill-defined opacity in the right mid lung, similar to the prior examination, most compatible with resolving post biopsy hemorrhage. Left lung is clear. No  pleural effusions. No evidence of pulmonary edema. Heart size is normal. Upper mediastinal contours are within normal limits. Aortic atherosclerosis. Orthopedic fixation hardware in the lower cervical spine and in the lumbar spine. IMPRESSION: 1. Support apparatus and postoperative changes, as above. 2. Trace right pneumothorax decreased compared to the prior examination. 3. Aortic atherosclerosis. Electronically Signed   By: Vinnie Langton M.D.   On: 11/23/2018 11:24   Dg Chest Port 1 View  Result Date: 11/22/2018 CLINICAL DATA:  Follow-up pneumothorax. EXAM: PORTABLE CHEST 1 VIEW COMPARISON:  11/22/2018 at 8:29 a.m. FINDINGS: A RIGHT thoracostomy tube is again noted. Small RIGHT apical pneumothorax is unchanged. The cardiomediastinal silhouette is unremarkable. Mid RIGHT lung opacity is unchanged. No evidence of LEFT pneumothorax or definite pleural effusion. IMPRESSION: Unchanged appearance of the chest with RIGHT thoracostomy tube and small RIGHT apical pneumothorax. Electronically Signed   By: Margarette Canada M.D.   On: 11/22/2018 13:11   Dg Chest Port 1 View  Result Date: 11/21/2018 CLINICAL DATA:  Lung collapse. EXAM: PORTABLE CHEST 1 VIEW COMPARISON:  CT 11/13/2018.  12/26/2015. FINDINGS: Mediastinum and hilar structures normal. Cardiomegaly with normal pulmonary vascularity. Density of the right lateral chest again noted. Chronic interstitial prominence again noted. Right chest tube noted. No pneumothorax. Cervicothoracic and thoracolumbar spine fusion. IMPRESSION: 1. Right chest tube noted. No pneumothorax. Persistent density noted over the right lateral chest. Stable changes of chronic interstitial lung disease. 2.  Cardiomegaly.  Normal pulmonary vascularity. Electronically Signed   By: Marcello Moores  Register   On: 11/21/2018 14:14   Ct Perc Pleural Drain Genia Harold Cath W/img Guide  Result Date: 11/21/2018 INDICATION: 68 year old with suspicious right lung nodules and history of breast cancer. Concern  for primary lung cancer versus metastatic disease. Tissue diagnosis is needed. EXAM: CT-GUIDED RIGHT LUNG NODULE BIOPSY CT-GUIDED RIGHT CHEST TUBE PLACEMENT MEDICATIONS: None. ANESTHESIA/SEDATION: Moderate (conscious) sedation was employed during this procedure. A total of Versed 3.0 mg and Fentanyl 150 mcg was administered intravenously. Moderate Sedation Time: 47 minutes. The patient's level of consciousness and vital signs were monitored continuously by radiology nursing throughout the procedure under my direct supervision. FLUOROSCOPY TIME:  None COMPLICATIONS: SIR LEVEL C - Requires therapy, minor hospitalization (<48 hrs). Pneumothorax and chest tube placement. PROCEDURE: Informed written consent was obtained from the patient after a thorough discussion of the procedural risks, benefits and alternatives. All questions were addressed. A timeout was performed prior to the initiation of the procedure. Patient was placed on the CT scanner. The right side was mildly elevated. CT images through the chest were obtained. Pleural-based lesion in the right upper lobe was targeted. Overlying skin was prepped with chlorhexidine  and sterile field was created. Skin was anesthetized with 1% lidocaine. Using CT guidance, 17 gauge coaxial needle was directed into the pleural-based lesion. Needle position was confirmed within the lesion and core biopsy was attempted. No tissue was collected on the initial core biopsy and follow up CT images demonstrate the patient had developed a pneumothorax. Due to the pneumothorax, a Yueh catheter was directed into the pleural space and the air was aspirated. 17 gauge needle was then directed back at towards the pleural-based lesion using CT guidance. Needle position was confirmed within the lesion. A total of 4 core biopsies were obtained with an 18 gauge device. In order to perform the biopsy, air was repeatedly removed from the pleural space. Patient was felt to need a chest tube  following the procedure. The 17 gauge needle was removed. Yueh catheter was exchanged for a 12 [place] multipurpose drain over a stiff Amplatz wire. Follow up CT images confirmed placement in the pleural space. Catheter was sutured to skin and attached to a PleurEvac device. FINDINGS: Emphysema with indeterminate nodules in the right upper lobe. Pleural-based lesion in the right upper lobe was targeted. Needle position was confirmed within the lesion and patient had developed a small pneumothorax. Pneumothorax enlarged while trying to obtain the core biopsies. Four adequate core biopsies were obtained. Twelve French pleural catheter was confirmed within the pleural space at the end of the procedure. IMPRESSION: 1. CT-guided biopsy of the right upper lobe pulmonary nodule was complicated by a pneumothorax. The CT-guided biopsy was successful and a 12 French chest tube was placed at the end of the procedure. 2. Patient will be admitted for pneumothorax and chest tube management. Electronically Signed   By: Markus Daft M.D.   On: 11/21/2018 17:41      Subjective: Some chest discomfort from chest tube, otherwise feels well.  Discharge Exam: Vitals:   11/23/18 0800 11/23/18 1114  BP: 126/81 127/82  Pulse: 68 66  Resp: 15 18  Temp:  98 F (36.7 C)  SpO2: 92% 97%   Vitals:   11/23/18 0633 11/23/18 0723 11/23/18 0800 11/23/18 1114  BP:  126/81 126/81 127/82  Pulse:  61 68 66  Resp:  13 15 18   Temp:  97.8 F (36.6 C)  98 F (36.7 C)  TempSrc:  Oral  Oral  SpO2:  94% 92% 97%  Weight: 49.4 kg     Height:        General: Pt is alert, awake, not in acute distress Cardiovascular: RRR, S1/S2 +, no rubs, no gallops Respiratory: CTA bilaterally, no wheezing, no rhonchi Abdominal: Soft, NT, ND, bowel sounds + Extremities: no edema, no cyanosis    The results of significant diagnostics from this hospitalization (including imaging, microbiology, ancillary and laboratory) are listed below for  reference.     Microbiology: Recent Results (from the past 240 hour(s))  SARS CORONAVIRUS 2 Nasal Swab Aptima Multi Swab     Status: None   Collection Time: 11/18/18  7:09 AM   Specimen: Aptima Multi Swab; Nasal Swab  Result Value Ref Range Status   SARS Coronavirus 2 NEGATIVE NEGATIVE Final    Comment: (NOTE) SARS-CoV-2 target nucleic acids are NOT DETECTED. The SARS-CoV-2 RNA is generally detectable in upper and lower respiratory specimens during the acute phase of infection. Negative results do not preclude SARS-CoV-2 infection, do not rule out co-infections with other pathogens, and should not be used as the sole basis for treatment or other patient management decisions. Negative results  must be combined with clinical observations, patient history, and epidemiological information. The expected result is Negative. Fact Sheet for Patients: SugarRoll.be Fact Sheet for Healthcare Providers: https://www.woods-mathews.com/ This test is not yet approved or cleared by the [place] FDA and  has been authorized for detection and/or diagnosis of SARS-CoV-2 by FDA under an Emergency Use Authorization (EUA). This EUA will remain  in effect (meaning this test can be used) for the duration of the COVID-19 declaration under Section 56 4(b)(1) of the Act, 21 U.S.C. section 360bbb-3(b)(1), unless the authorization is terminated or revoked sooner. Performed at [facility], [place] [address], [place], [place] [address]   MRSA PCR Screening     Status: None   Collection Time: 11/21/18  4:20 PM   Specimen: Nasal Mucosa; Nasopharyngeal  Result Value Ref Range Status   MRSA by PCR NEGATIVE NEGATIVE Final    Comment:        The GeneXpert MRSA Assay (FDA approved for NASAL specimens only), is one component of a comprehensive MRSA colonization surveillance program. It is not intended to diagnose MRSA infection nor to guide or monitor treatment  for MRSA infections. Performed at [facility], [place] [address], [place], [place] [address]      Labs: BNP (last 3 results) No results for input(s): BNP in the last 8760 hours. Basic Metabolic Panel: Recent Labs  Lab 11/21/18 1655 11/22/18 0225  NA 136 136  K 3.5 3.9  CL 102 103  CO2 24 23  GLUCOSE 118* 97  BUN 13 12  CREATININE 0.91 0.98  CALCIUM 8.1* 8.0*   Liver Function Tests: No results for input(s): AST, ALT, ALKPHOS, BILITOT, PROT, ALBUMIN in the last 168 hours. No results for input(s): LIPASE, AMYLASE in the last 168 hours. No results for input(s): AMMONIA in the last 168 hours. CBC: Recent Labs  Lab 11/21/18 0943 11/22/18 0225  WBC 6.0 4.1  HGB 12.9 10.0*  HCT 39.4 30.2*  MCV 111.9* 110.6*  PLT 408* 304   Cardiac Enzymes: No results for input(s): CKTOTAL, CKMB, CKMBINDEX, TROPONINI in the last 168 hours. BNP: Invalid input(s): POCBNP CBG: No results for input(s): GLUCAP in the last 168 hours. D-Dimer No results for input(s): DDIMER in the last 72 hours. Hgb A1c No results for input(s): HGBA1C in the last 72 hours. Lipid Profile No results for input(s): CHOL, HDL, LDLCALC, TRIG, CHOLHDL, LDLDIRECT in the last 72 hours. Thyroid function studies No results for input(s): TSH, T4TOTAL, T3FREE, THYROIDAB in the last 72 hours.  Invalid input(s): FREET3 Anemia work up No results for input(s): VITAMINB12, FOLATE, FERRITIN, TIBC, IRON, RETICCTPCT in the last 72 hours. Urinalysis    Component Value Date/Time   COLORURINE STRAW (A) 10/01/2017 1202   APPEARANCEUR CLEAR 10/01/2017 1202   LABSPEC 1.006 10/01/2017 1202   PHURINE 5.0 10/01/2017 1202   GLUCOSEU NEGATIVE 10/01/2017 1202   HGBUR SMALL (A) 10/01/2017 1202   BILIRUBINUR NEGATIVE 10/01/2017 1202   KETONESUR NEGATIVE 10/01/2017 1202   PROTEINUR NEGATIVE 10/01/2017 1202   NITRITE NEGATIVE 10/01/2017 1202   LEUKOCYTESUR NEGATIVE 10/01/2017 1202   Sepsis Labs Invalid input(s): PROCALCITONIN,   WBC,  LACTICIDVEN Microbiology Recent Results (from the past 240 hour(s))  SARS CORONAVIRUS 2 Nasal Swab Aptima Multi Swab     Status: None   Collection Time: 11/18/18  7:09 AM   Specimen: Aptima Multi Swab; Nasal Swab  Result Value Ref Range Status   SARS Coronavirus 2 NEGATIVE NEGATIVE Final    Comment: (NOTE)  SARS-CoV-2 target nucleic acids are NOT DETECTED. The SARS-CoV-2 RNA is generally detectable in upper and lower respiratory specimens during the acute phase of infection. Negative results do not preclude SARS-CoV-2 infection, do not rule out co-infections with other pathogens, and should not be used as the sole basis for treatment or other patient management decisions. Negative results must be combined with clinical observations, patient history, and epidemiological information. The expected result is Negative. Fact Sheet for Patients: SugarRoll.be Fact Sheet for Healthcare Providers: https://www.woods-mathews.com/ This test is not yet approved or cleared by the [place] FDA and  has been authorized for detection and/or diagnosis of SARS-CoV-2 by FDA under an Emergency Use Authorization (EUA). This EUA will remain  in effect (meaning this test can be used) for the duration of the COVID-19 declaration under Section 56 4(b)(1) of the Act, 21 U.S.C. section 360bbb-3(b)(1), unless the authorization is terminated or revoked sooner. Performed at [facility], [place] [address], [place], [place] [address]   MRSA PCR Screening     Status: None   Collection Time: 11/21/18  4:20 PM   Specimen: Nasal Mucosa; Nasopharyngeal  Result Value Ref Range Status   MRSA by PCR NEGATIVE NEGATIVE Final    Comment:        The GeneXpert MRSA Assay (FDA approved for NASAL specimens only), is one component of a comprehensive MRSA colonization surveillance program. It is not intended to diagnose MRSA infection nor to guide or monitor  treatment for MRSA infections. Performed at [facility], [place] [address], [place], [place] [address]      Time coordinating discharge: 35 minutes  SIGNED:   Cordelia Poche, MD [facility] 11/23/2018, 3:00 PM

## 2018-11-23 NOTE — Plan of Care (Signed)

## 2018-11-23 NOTE — Progress Notes (Signed)
PROGRESS NOTE    Ashley [name]  RKY:[id number] DOB: 05-Sep-1950 DOA: 11/21/2018 PCP: [name], NP   Brief Narrative: Ashley [name] is a 68 y.o. 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 Patient presented secondary to pneumothorax secondary to biopsy. Chest tube placed.   Assessment & Plan:   Principal Problem:   Pneumothorax after biopsy Active Problems:   Emphysema of lung (HCC)   Thrombocythemia, essential (HCC)   History of breast cancer   Combined systolic and diastolic cardiac dysfunction   Pneumothorax after biopsy Right chest tube placed by IR. Pneumothorax improved. Put on waterseal yesterday. Chest x-ray significant for small apical pneumo -Management per IR  COPD Stable. No exacerbation. -Continue Duoneb prn  Combined systolic and diastolic heart failure Stable. -Strict in and out, daily weights -Continue Lasix  Essential hypertension -Continue amlodipine  Hyperlipidemia -Continue Crestor  Thrombocytosis Stable.  History of breat cancer Outpatient follow-up  Anemia Macrocytic. Chronic.  GERD -Continue Protonix   DVT prophylaxis: Lovenox Code Status:   Code Status: Full Code Family Communication: None Disposition Plan: Discharge in 24-48 hours   Consultants:   Interventional radiology  Procedures:   Chest tube  Antimicrobials:  None    Subjective: Some right sided chest pain in location of chest tube  Objective: Vitals:   11/23/18 0312 11/23/18 0633 11/23/18 0723 11/23/18 0800  BP: (!) 141/84  126/81 126/81  Pulse: 62  61 68  Resp: 12  13 15   Temp: 97.9 F (36.6 C)  97.8 F (36.6 C)   TempSrc: Oral  Oral   SpO2: 97%  94% 92%  Weight:  49.4 kg    Height:        Intake/Output Summary (Last 24 hours) at 11/23/2018 0859 Last data filed at 11/23/2018 3151 Gross per 24 hour  Intake 840 ml  Output 240 ml   Net 600 ml   Filed Weights   11/21/18 0924 11/22/18 0343 11/23/18 0633  Weight: 50.8 kg 49.8 kg 49.4 kg    Examination:  General exam: Appears calm and comfortable Respiratory system: Clear to auscultation. Respiratory effort normal. Cardiovascular system: S1 & S2 heard, RRR. No murmurs, rubs, gallops or clicks. Gastrointestinal system: Abdomen is nondistended, soft and nontender. No organomegaly or masses felt. Normal bowel sounds heard. Central nervous system: Alert and oriented. No focal neurological deficits. Extremities: No edema. No calf tenderness Skin: No cyanosis. No rashes Psychiatry: Judgement and insight appear normal. Mood & affect appropriate.     Data Reviewed: I have personally reviewed following labs and imaging studies  CBC: Recent Labs  Lab 11/21/18 0943 11/22/18 0225  WBC 6.0 4.1  HGB 12.9 10.0*  HCT 39.4 30.2*  MCV 111.9* 110.6*  PLT 408* 761   Basic Metabolic Panel: Recent Labs  Lab 11/21/18 1655 11/22/18 0225  NA 136 136  K 3.5 3.9  CL 102 103  CO2 24 23  GLUCOSE 118* 97  BUN 13 12  CREATININE 0.91 0.98  CALCIUM 8.1* 8.0*   GFR: Estimated Creatinine Clearance: 41.7 mL/min (by C-G formula based on SCr of 0.98 mg/dL). Liver Function Tests: No results for input(s): AST, ALT, ALKPHOS, BILITOT, PROT, ALBUMIN in the last 168 hours. No results for input(s): LIPASE, AMYLASE in the last 168 hours. No results for input(s): AMMONIA in the last 168 hours. Coagulation Profile: Recent Labs  Lab 11/21/18 0943  INR 1.0   Cardiac  Enzymes: No results for input(s): CKTOTAL, CKMB, CKMBINDEX, TROPONINI in the last 168 hours. BNP (last 3 results) No results for input(s): PROBNP in the last 8760 hours. HbA1C: No results for input(s): HGBA1C in the last 72 hours. CBG: No results for input(s): GLUCAP in the last 168 hours. Lipid Profile: No results for input(s): CHOL, HDL, LDLCALC, TRIG, CHOLHDL, LDLDIRECT in the last 72 hours. Thyroid Function  Tests: No results for input(s): TSH, T4TOTAL, FREET4, T3FREE, THYROIDAB in the last 72 hours. Anemia Panel: No results for input(s): VITAMINB12, FOLATE, FERRITIN, TIBC, IRON, RETICCTPCT in the last 72 hours. Sepsis Labs: No results for input(s): PROCALCITON, LATICACIDVEN in the last 168 hours.  Recent Results (from the past 240 hour(s))  SARS CORONAVIRUS 2 Nasal Swab Aptima Multi Swab     Status: None   Collection Time: 11/18/18  7:09 AM   Specimen: Aptima Multi Swab; Nasal Swab  Result Value Ref Range Status   SARS Coronavirus 2 NEGATIVE NEGATIVE Final    Comment: (NOTE) SARS-CoV-2 target nucleic acids are NOT DETECTED. The SARS-CoV-2 RNA is generally detectable in upper and lower respiratory specimens during the acute phase of infection. Negative results do not preclude SARS-CoV-2 infection, do not rule out co-infections with other pathogens, and should not be used as the sole basis for treatment or other patient management decisions. Negative results must be combined with clinical observations, patient history, and epidemiological information. The expected result is Negative. Fact Sheet for Patients: SugarRoll.be Fact Sheet for Healthcare Providers: https://www.woods-mathews.com/ This test is not yet approved or cleared by the [place] FDA and  has been authorized for detection and/or diagnosis of SARS-CoV-2 by FDA under an Emergency Use Authorization (EUA). This EUA will remain  in effect (meaning this test can be used) for the duration of the COVID-19 declaration under Section 56 4(b)(1) of the Act, 21 U.S.C. section 360bbb-3(b)(1), unless the authorization is terminated or revoked sooner. Performed at [facility], [place] [address], [place], [place] [address]   MRSA PCR Screening     Status: None   Collection Time: 11/21/18  4:20 PM   Specimen: Nasal Mucosa; Nasopharyngeal  Result Value Ref Range Status   MRSA by PCR  NEGATIVE NEGATIVE Final    Comment:        The GeneXpert MRSA Assay (FDA approved for NASAL specimens only), is one component of a comprehensive MRSA colonization surveillance program. It is not intended to diagnose MRSA infection nor to guide or monitor treatment for MRSA infections. Performed at [facility], [place] [address] [place], [place] [address]          Radiology Studies: Dg Chest 2 View  Result Date: 11/22/2018 CLINICAL DATA:  68 year old female with history of CT-guided biopsy of pulmonary nodule complicated by pneumothorax. Follow-up study. EXAM: CHEST - 2 VIEW COMPARISON:  Chest x-ray 11/21/2018. FINDINGS: Right-sided pigtail drainage catheter in position with tip projecting over the mid right hemithorax. Trace residual right apical pneumothorax. Ill-defined opacity in the right mid lung, likely resolving hemorrhage at site of recent biopsy. No other acute consolidative airspace disease. No pleural effusions. No evidence of pulmonary edema. Heart size is normal. Upper mediastinal contours are within normal limits. Aortic atherosclerosis. Orthopedic fixation hardware in the lower cervical spine. Surgical clips in the left axilla likely from prior lymph node dissection. IMPRESSION: 1. Right-sided chest tube has been repositioned slightly with trace residual right apical pneumothorax. 2. Small amount of resolving post biopsy hemorrhage in the right mid lung.  3. Aortic atherosclerosis. Electronically Signed   By: Vinnie Langton M.D.   On: 11/22/2018 10:46   Ct Biopsy  Result Date: 11/21/2018 INDICATION: 68 year old with suspicious right lung nodules and history of breast cancer. Concern for primary lung cancer versus metastatic disease. Tissue diagnosis is needed. EXAM: CT-GUIDED RIGHT LUNG NODULE BIOPSY CT-GUIDED RIGHT CHEST TUBE PLACEMENT MEDICATIONS: None. ANESTHESIA/SEDATION: Moderate (conscious) sedation was employed during this procedure. A total of Versed 3.0 mg and  Fentanyl 150 mcg was administered intravenously. Moderate Sedation Time: 47 minutes. The patient's level of consciousness and vital signs were monitored continuously by radiology nursing throughout the procedure under my direct supervision. FLUOROSCOPY TIME:  None COMPLICATIONS: SIR LEVEL C - Requires therapy, minor hospitalization (<48 hrs). Pneumothorax and chest tube placement. PROCEDURE: Informed written consent was obtained from the patient after a thorough discussion of the procedural risks, benefits and alternatives. All questions were addressed. A timeout was performed prior to the initiation of the procedure. Patient was placed on the CT scanner. The right side was mildly elevated. CT images through the chest were obtained. Pleural-based lesion in the right upper lobe was targeted. Overlying skin was prepped with chlorhexidine and sterile field was created. Skin was anesthetized with 1% lidocaine. Using CT guidance, 17 gauge coaxial needle was directed into the pleural-based lesion. Needle position was confirmed within the lesion and core biopsy was attempted. No tissue was collected on the initial core biopsy and follow up CT images demonstrate the patient had developed a pneumothorax. Due to the pneumothorax, a Yueh catheter was directed into the pleural space and the air was aspirated. 17 gauge needle was then directed back at towards the pleural-based lesion using CT guidance. Needle position was confirmed within the lesion. A total of 4 core biopsies were obtained with an 18 gauge device. In order to perform the biopsy, air was repeatedly removed from the pleural space. Patient was felt to need a chest tube following the procedure. The 17 gauge needle was removed. Yueh catheter was exchanged for a 12 [place] multipurpose drain over a stiff Amplatz wire. Follow up CT images confirmed placement in the pleural space. Catheter was sutured to skin and attached to a PleurEvac device. FINDINGS: Emphysema with  indeterminate nodules in the right upper lobe. Pleural-based lesion in the right upper lobe was targeted. Needle position was confirmed within the lesion and patient had developed a small pneumothorax. Pneumothorax enlarged while trying to obtain the core biopsies. Four adequate core biopsies were obtained. Twelve French pleural catheter was confirmed within the pleural space at the end of the procedure. IMPRESSION: 1. CT-guided biopsy of the right upper lobe pulmonary nodule was complicated by a pneumothorax. The CT-guided biopsy was successful and a 12 French chest tube was placed at the end of the procedure. 2. Patient will be admitted for pneumothorax and chest tube management. Electronically Signed   By: Markus Daft M.D.   On: 11/21/2018 17:41   Dg Chest Port 1 View  Result Date: 11/22/2018 CLINICAL DATA:  Follow-up pneumothorax. EXAM: PORTABLE CHEST 1 VIEW COMPARISON:  11/22/2018 at 8:29 a.m. FINDINGS: A RIGHT thoracostomy tube is again noted. Small RIGHT apical pneumothorax is unchanged. The cardiomediastinal silhouette is unremarkable. Mid RIGHT lung opacity is unchanged. No evidence of LEFT pneumothorax or definite pleural effusion. IMPRESSION: Unchanged appearance of the chest with RIGHT thoracostomy tube and small RIGHT apical pneumothorax. Electronically Signed   By: Margarette Canada M.D.   On: 11/22/2018 13:11   Dg Chest [facility] 1 [address]  Result Date: 11/21/2018 CLINICAL DATA:  Lung collapse. EXAM: PORTABLE CHEST 1 VIEW COMPARISON:  CT 11/13/2018.  12/26/2015. FINDINGS: Mediastinum and hilar structures normal. Cardiomegaly with normal pulmonary vascularity. Density of the right lateral chest again noted. Chronic interstitial prominence again noted. Right chest tube noted. No pneumothorax. Cervicothoracic and thoracolumbar spine fusion. IMPRESSION: 1. Right chest tube noted. No pneumothorax. Persistent density noted over the right lateral chest. Stable changes of chronic interstitial lung disease. 2.   Cardiomegaly.  Normal pulmonary vascularity. Electronically Signed   By: Marcello Moores  Register   On: 11/21/2018 14:14   Ct Perc Pleural Drain Genia Harold Cath W/img Guide  Result Date: 11/21/2018 INDICATION: 68 year old with suspicious right lung nodules and history of breast cancer. Concern for primary lung cancer versus metastatic disease. Tissue diagnosis is needed. EXAM: CT-GUIDED RIGHT LUNG NODULE BIOPSY CT-GUIDED RIGHT CHEST TUBE PLACEMENT MEDICATIONS: None. ANESTHESIA/SEDATION: Moderate (conscious) sedation was employed during this procedure. A total of Versed 3.0 mg and Fentanyl 150 mcg was administered intravenously. Moderate Sedation Time: 47 minutes. The patient's level of consciousness and vital signs were monitored continuously by radiology nursing throughout the procedure under my direct supervision. FLUOROSCOPY TIME:  None COMPLICATIONS: SIR LEVEL C - Requires therapy, minor hospitalization (<48 hrs). Pneumothorax and chest tube placement. PROCEDURE: Informed written consent was obtained from the patient after a thorough discussion of the procedural risks, benefits and alternatives. All questions were addressed. A timeout was performed prior to the initiation of the procedure. Patient was placed on the CT scanner. The right side was mildly elevated. CT images through the chest were obtained. Pleural-based lesion in the right upper lobe was targeted. Overlying skin was prepped with chlorhexidine and sterile field was created. Skin was anesthetized with 1% lidocaine. Using CT guidance, 17 gauge coaxial needle was directed into the pleural-based lesion. Needle position was confirmed within the lesion and core biopsy was attempted. No tissue was collected on the initial core biopsy and follow up CT images demonstrate the patient had developed a pneumothorax. Due to the pneumothorax, a Yueh catheter was directed into the pleural space and the air was aspirated. 17 gauge needle was then directed back at towards  the pleural-based lesion using CT guidance. Needle position was confirmed within the lesion. A total of 4 core biopsies were obtained with an 18 gauge device. In order to perform the biopsy, air was repeatedly removed from the pleural space. Patient was felt to need a chest tube following the procedure. The 17 gauge needle was removed. Yueh catheter was exchanged for a 12 [place] multipurpose drain over a stiff Amplatz wire. Follow up CT images confirmed placement in the pleural space. Catheter was sutured to skin and attached to a PleurEvac device. FINDINGS: Emphysema with indeterminate nodules in the right upper lobe. Pleural-based lesion in the right upper lobe was targeted. Needle position was confirmed within the lesion and patient had developed a small pneumothorax. Pneumothorax enlarged while trying to obtain the core biopsies. Four adequate core biopsies were obtained. Twelve French pleural catheter was confirmed within the pleural space at the end of the procedure. IMPRESSION: 1. CT-guided biopsy of the right upper lobe pulmonary nodule was complicated by a pneumothorax. The CT-guided biopsy was successful and a 12 French chest tube was placed at the end of the procedure. 2. Patient will be admitted for pneumothorax and chest tube management. Electronically Signed   By: Markus Daft M.D.   On: 11/21/2018 17:41        Scheduled Meds: . amLODipine  2.5 mg Oral Daily  . aspirin EC  81 mg Oral Daily  . clonazePAM  0.5 mg Oral QHS  . enoxaparin (LOVENOX) injection  40 mg Subcutaneous Q24H  . furosemide  20 mg Oral q1800  . furosemide  40 mg Oral Q24H  . gabapentin  300 mg Oral QHS  . hydroxyurea  1,000 mg Oral Once per day on Sun Mon Sat  . hydroxyurea  500 mg Oral Once per day on Tue Wed Thu Fri  . nitroGLYCERIN  0.1 mg Transdermal Daily  . pantoprazole  40 mg Oral Daily  . potassium chloride SA  60 mEq Oral QHS  . rosuvastatin  40 mg Oral q1800   Continuous Infusions: . sodium chloride        LOS: 2 days     Cordelia Poche, MD [facility] 11/23/2018, 8:59 AM  If 7PM-7AM, please contact night-coverage [email or web address]

## 2018-11-23 NOTE — Progress Notes (Signed)
Referring Physician(s): [place]  Supervising Physician: Corrie Mckusick  Patient Status:  [facility] - In-pt  Chief Complaint: "When can tube come out"  Subjective:  Right lung nodule biopsy with subsequent development of right pneumothorax s/p right chest tube placement 11/21/2018 by Dr. Anselm Pancoast. Patient awake and alert laying in bed. Eager for tube removal and to go home. Right chest tube site c/d/i. Sating at 94% on RA.  CXR this AM: 1. Support apparatus and postoperative changes, as above. 2. Trace right pneumothorax decreased compared to the prior examination. 3. Aortic atherosclerosis.   Allergies: Penicillins and Tape  Medications: Prior to Admission medications   Medication Sig Start Date End Date Taking? Authorizing Provider  amLODipine (NORVASC) 2.5 MG tablet Take 2.5 mg by mouth daily.  05/27/18  Yes [provider]  aspirin EC 81 MG tablet Take 81 mg by mouth daily.   Yes [provider]  Calcium Carb-Cholecalciferol (CALCIUM 600+D) 600-800 MG-UNIT TABS Take 1 tablet by mouth 2 (two) times daily.   Yes [provider]  Cholecalciferol (VITAMIN D-3) 1000 units CAPS Take 1,000 Units daily by mouth.    Yes [provider]  clonazePAM (KLONOPIN) 0.5 MG tablet Take 1 tablet (0.5 mg total) by mouth at bedtime. 10/20/18  Yes Derek Jack, MD  cyanocobalamin (,VITAMIN B-12,) 1000 MCG/ML injection INJECT 1 ML INTO THE MUSCLE ONCE MONTHLY AS DIRECTED. Patient taking differently: Inject 1,000 mcg into the muscle every 30 (thirty) days.  09/26/16  Yes Kefalas, Manon Hilding, PA-C  furosemide (LASIX) 40 MG tablet Take 20-40 mg by mouth See admin instructions. Take 40 mg in the morning and 20 mg at night 01/16/17  Yes [provider]  gabapentin (NEURONTIN) 300 MG capsule Take 300 mg by mouth at bedtime.   Yes [provider]  hydroxyurea (HYDREA) 500 MG capsule TAKE 1 CAPSULE DAILY TUESDAY-FRIDAY AND 2 CAPSULES ON  SATURDAY-MONDAY. Patient taking differently: Take 500-1,000 mg by mouth See admin instructions. Take 500 mg at night on Tue, Wed, Thur, and Fri, take 1000 mg at night on Sat, Sun, and Mon 10/20/18  Yes Derek Jack, MD  ibandronate (BONIVA) 150 MG tablet Take 1 tablet by mouth every 30 (thirty) days. 07/14/12  Yes [provider]  ibuprofen (ADVIL,MOTRIN) 800 MG tablet Take 800 mg every 8 (eight) hours as needed by mouth for moderate pain.   Yes [provider]  methocarbamol (ROBAXIN) 500 MG tablet Take 500 mg by mouth every 6 (six) hours as needed for muscle spasms.    Yes [provider]  nitroGLYCERIN (NITRODUR - DOSED IN MG/24 HR) 0.1 mg/hr patch Place 0.1 mg onto the skin daily.  10/08/18  Yes [provider]  ondansetron (ZOFRAN) 8 MG tablet Take 1 tablet (8 mg total) by mouth every 8 (eight) hours as needed for nausea or vomiting. 08/01/17  Yes Derek Jack, MD  pantoprazole (PROTONIX) 40 MG tablet Take 40 mg daily by mouth.   Yes [provider]  polyethylene glycol (MIRALAX / GLYCOLAX) 17 g packet Take 17 g by mouth daily as needed for mild constipation.   Yes [provider]  potassium chloride SA (K-DUR,KLOR-CON) 20 MEQ tablet Take 1 tablet (20 mEq total) by mouth 3 (three) times daily. Patient taking differently: Take 60 mEq by mouth at bedtime.  02/15/17  Yes Kefalas, Manon Hilding, PA-C  rosuvastatin (CRESTOR) 40 MG tablet Take 40 mg at bedtime by mouth.    Yes [provider]  traMADol Veatrice Bourbon)  50 MG tablet Take 50 mg by mouth at bedtime as needed for moderate pain.  05/02/18  Yes [provider]  albuterol (PROVENTIL HFA;VENTOLIN HFA) 108 (90 Base) MCG/ACT inhaler Inhale 2 puffs into the lungs every 6 (six) hours as needed for wheezing or shortness of breath. 05/01/17   Mannam, Hart Robinsons, MD  ipratropium-albuterol (DUONEB) 0.5-2.5 (3) MG/3ML SOLN Take 3 mLs by nebulization every 6 (six) hours as needed (shortness  of breath). 11/21/15   Arrien, Jimmy Picket, MD     Vital Signs: BP 127/82 (BP Location: Right Wrist)    Pulse 66    Temp 98 F (36.7 C) (Oral)    Resp 18    Ht 5\' 2"  (1.575 m)    Wt 108 lb 14.5 oz (49.4 kg)    SpO2 97%    BMI 19.92 kg/m   Physical Exam Vitals signs and nursing note reviewed.  Constitutional:      General: She is not in acute distress.    Appearance: Normal appearance.  Pulmonary:     Effort: Pulmonary effort is normal. No respiratory distress.     Comments: Right chest tube site without tenderness, erythema, drainage, or active bleeding; approximately 50 cc of blood tinged fluid in Pleur-evac; tube to water seal with (-) air leak. Skin:    General: Skin is warm and dry.  Neurological:     Mental Status: She is alert and oriented to person, place, and time.  Psychiatric:        Mood and Affect: Mood normal.        Behavior: Behavior normal.        Thought Content: Thought content normal.        Judgment: Judgment normal.     Imaging: Dg Chest 2 View  Result Date: 11/22/2018 CLINICAL DATA:  68 year old female with history of CT-guided biopsy of pulmonary nodule complicated by pneumothorax. Follow-up study. EXAM: CHEST - 2 VIEW COMPARISON:  Chest x-ray 11/21/2018. FINDINGS: Right-sided pigtail drainage catheter in position with tip projecting over the mid right hemithorax. Trace residual right apical pneumothorax. Ill-defined opacity in the right mid lung, likely resolving hemorrhage at site of recent biopsy. No other acute consolidative airspace disease. No pleural effusions. No evidence of pulmonary edema. Heart size is normal. Upper mediastinal contours are within normal limits. Aortic atherosclerosis. Orthopedic fixation hardware in the lower cervical spine. Surgical clips in the left axilla likely from prior lymph node dissection. IMPRESSION: 1. Right-sided chest tube has been repositioned slightly with trace residual right apical pneumothorax. 2. Small amount of  resolving post biopsy hemorrhage in the right mid lung. 3. Aortic atherosclerosis. Electronically Signed   By: Vinnie Langton M.D.   On: 11/22/2018 10:46   Ct Biopsy  Result Date: 11/21/2018 INDICATION: 68 year old with suspicious right lung nodules and history of breast cancer. Concern for primary lung cancer versus metastatic disease. Tissue diagnosis is needed. EXAM: CT-GUIDED RIGHT LUNG NODULE BIOPSY CT-GUIDED RIGHT CHEST TUBE PLACEMENT MEDICATIONS: None. ANESTHESIA/SEDATION: Moderate (conscious) sedation was employed during this procedure. A total of Versed 3.0 mg and Fentanyl 150 mcg was administered intravenously. Moderate Sedation Time: 47 minutes. The patient's level of consciousness and vital signs were monitored continuously by radiology nursing throughout the procedure under my direct supervision. FLUOROSCOPY TIME:  None COMPLICATIONS: SIR LEVEL C - Requires therapy, minor hospitalization (<48 hrs). Pneumothorax and chest tube placement. PROCEDURE: Informed written consent was obtained from the patient after a thorough discussion of the procedural risks, benefits and alternatives. All questions  were addressed. A timeout was performed prior to the initiation of the procedure. Patient was placed on the CT scanner. The right side was mildly elevated. CT images through the chest were obtained. Pleural-based lesion in the right upper lobe was targeted. Overlying skin was prepped with chlorhexidine and sterile field was created. Skin was anesthetized with 1% lidocaine. Using CT guidance, 17 gauge coaxial needle was directed into the pleural-based lesion. Needle position was confirmed within the lesion and core biopsy was attempted. No tissue was collected on the initial core biopsy and follow up CT images demonstrate the patient had developed a pneumothorax. Due to the pneumothorax, a Yueh catheter was directed into the pleural space and the air was aspirated. 17 gauge needle was then directed back at  towards the pleural-based lesion using CT guidance. Needle position was confirmed within the lesion. A total of 4 core biopsies were obtained with an 18 gauge device. In order to perform the biopsy, air was repeatedly removed from the pleural space. Patient was felt to need a chest tube following the procedure. The 17 gauge needle was removed. Yueh catheter was exchanged for a 12 [place] multipurpose drain over a stiff Amplatz wire. Follow up CT images confirmed placement in the pleural space. Catheter was sutured to skin and attached to a PleurEvac device. FINDINGS: Emphysema with indeterminate nodules in the right upper lobe. Pleural-based lesion in the right upper lobe was targeted. Needle position was confirmed within the lesion and patient had developed a small pneumothorax. Pneumothorax enlarged while trying to obtain the core biopsies. Four adequate core biopsies were obtained. Twelve French pleural catheter was confirmed within the pleural space at the end of the procedure. IMPRESSION: 1. CT-guided biopsy of the right upper lobe pulmonary nodule was complicated by a pneumothorax. The CT-guided biopsy was successful and a 12 French chest tube was placed at the end of the procedure. 2. Patient will be admitted for pneumothorax and chest tube management. Electronically Signed   By: Markus Daft M.D.   On: 11/21/2018 17:41   Dg Chest Port 1 View  Result Date: 11/23/2018 CLINICAL DATA:  68 year old female with history of right-sided pneumothorax. Status post chest tube placement. EXAM: PORTABLE CHEST 1 VIEW COMPARISON:  Chest x-ray 11/22/2018. FINDINGS: Small bore pigtail drainage catheter in the mid right hemithorax, similar to the prior study. Decreased size of trace right apical pneumothorax, now occupying less than 5% of the volume of the right hemithorax. Ill-defined opacity in the right mid lung, similar to the prior examination, most compatible with resolving post biopsy hemorrhage. Left lung is clear. No  pleural effusions. No evidence of pulmonary edema. Heart size is normal. Upper mediastinal contours are within normal limits. Aortic atherosclerosis. Orthopedic fixation hardware in the lower cervical spine and in the lumbar spine. IMPRESSION: 1. Support apparatus and postoperative changes, as above. 2. Trace right pneumothorax decreased compared to the prior examination. 3. Aortic atherosclerosis. Electronically Signed   By: Vinnie Langton M.D.   On: 11/23/2018 11:24   Dg Chest Port 1 View  Result Date: 11/22/2018 CLINICAL DATA:  Follow-up pneumothorax. EXAM: PORTABLE CHEST 1 VIEW COMPARISON:  11/22/2018 at 8:29 a.m. FINDINGS: A RIGHT thoracostomy tube is again noted. Small RIGHT apical pneumothorax is unchanged. The cardiomediastinal silhouette is unremarkable. Mid RIGHT lung opacity is unchanged. No evidence of LEFT pneumothorax or definite pleural effusion. IMPRESSION: Unchanged appearance of the chest with RIGHT thoracostomy tube and small RIGHT apical pneumothorax. Electronically Signed   By: Cleatis Polka.D.  On: 11/22/2018 13:11   Dg Chest Port 1 View  Result Date: 11/21/2018 CLINICAL DATA:  Lung collapse. EXAM: PORTABLE CHEST 1 VIEW COMPARISON:  CT 11/13/2018.  12/26/2015. FINDINGS: Mediastinum and hilar structures normal. Cardiomegaly with normal pulmonary vascularity. Density of the right lateral chest again noted. Chronic interstitial prominence again noted. Right chest tube noted. No pneumothorax. Cervicothoracic and thoracolumbar spine fusion. IMPRESSION: 1. Right chest tube noted. No pneumothorax. Persistent density noted over the right lateral chest. Stable changes of chronic interstitial lung disease. 2.  Cardiomegaly.  Normal pulmonary vascularity. Electronically Signed   By: Marcello Moores  Register   On: 11/21/2018 14:14   Ct Perc Pleural Drain Genia Harold Cath W/img Guide  Result Date: 11/21/2018 INDICATION: 68 year old with suspicious right lung nodules and history of breast cancer. Concern  for primary lung cancer versus metastatic disease. Tissue diagnosis is needed. EXAM: CT-GUIDED RIGHT LUNG NODULE BIOPSY CT-GUIDED RIGHT CHEST TUBE PLACEMENT MEDICATIONS: None. ANESTHESIA/SEDATION: Moderate (conscious) sedation was employed during this procedure. A total of Versed 3.0 mg and Fentanyl 150 mcg was administered intravenously. Moderate Sedation Time: 47 minutes. The patient's level of consciousness and vital signs were monitored continuously by radiology nursing throughout the procedure under my direct supervision. FLUOROSCOPY TIME:  None COMPLICATIONS: SIR LEVEL C - Requires therapy, minor hospitalization (<48 hrs). Pneumothorax and chest tube placement. PROCEDURE: Informed written consent was obtained from the patient after a thorough discussion of the procedural risks, benefits and alternatives. All questions were addressed. A timeout was performed prior to the initiation of the procedure. Patient was placed on the CT scanner. The right side was mildly elevated. CT images through the chest were obtained. Pleural-based lesion in the right upper lobe was targeted. Overlying skin was prepped with chlorhexidine and sterile field was created. Skin was anesthetized with 1% lidocaine. Using CT guidance, 17 gauge coaxial needle was directed into the pleural-based lesion. Needle position was confirmed within the lesion and core biopsy was attempted. No tissue was collected on the initial core biopsy and follow up CT images demonstrate the patient had developed a pneumothorax. Due to the pneumothorax, a Yueh catheter was directed into the pleural space and the air was aspirated. 17 gauge needle was then directed back at towards the pleural-based lesion using CT guidance. Needle position was confirmed within the lesion. A total of 4 core biopsies were obtained with an 18 gauge device. In order to perform the biopsy, air was repeatedly removed from the pleural space. Patient was felt to need a chest tube  following the procedure. The 17 gauge needle was removed. Yueh catheter was exchanged for a 12 [place] multipurpose drain over a stiff Amplatz wire. Follow up CT images confirmed placement in the pleural space. Catheter was sutured to skin and attached to a PleurEvac device. FINDINGS: Emphysema with indeterminate nodules in the right upper lobe. Pleural-based lesion in the right upper lobe was targeted. Needle position was confirmed within the lesion and patient had developed a small pneumothorax. Pneumothorax enlarged while trying to obtain the core biopsies. Four adequate core biopsies were obtained. Twelve French pleural catheter was confirmed within the pleural space at the end of the procedure. IMPRESSION: 1. CT-guided biopsy of the right upper lobe pulmonary nodule was complicated by a pneumothorax. The CT-guided biopsy was successful and a 12 French chest tube was placed at the end of the procedure. 2. Patient will be admitted for pneumothorax and chest tube management. Electronically Signed   By: Markus Daft M.D.   On: 11/21/2018 17:41  Labs:  CBC: Recent Labs    06/17/18 1054 10/07/18 1116 11/21/18 0943 11/22/18 0225  WBC 5.6 6.5 6.0 4.1  HGB 10.8* 11.3* 12.9 10.0*  HCT 34.9* 34.3* 39.4 30.2*  PLT 359 390 408* 304    COAGS: Recent Labs    11/21/18 0943  INR 1.0    BMP: Recent Labs    06/17/18 1054 10/07/18 1116 10/07/18 1120 11/21/18 1655 11/22/18 0225  NA 137 137  --  136 136  K 3.5 3.9  --  3.5 3.9  CL 105 100  --  102 103  CO2 24 26  --  24 23  GLUCOSE 158* 99  --  118* 97  BUN 20 25*  --  13 12  CALCIUM 9.0 9.2  --  8.1* 8.0*  CREATININE 0.95 1.27* 1.90* 0.91 0.98  GFRNONAA >60 43*  --  >60 58*  GFRAA >60 50*  --  >60 >60    LIVER FUNCTION TESTS: Recent Labs    12/25/17 1339 04/03/18 1336 06/17/18 1054 10/07/18 1116  BILITOT 0.3 0.4 0.5 0.5  AST 26 20 23 24   ALT 14 15 12 15   ALKPHOS 66 46 57 65  PROT 7.3 6.5 7.3 7.5  ALBUMIN 3.8 3.7 4.0 3.9     Assessment and Plan:  Right lung nodule biopsy with subsequent development of right pneumothorax s/p right chest tube placement 11/21/2018 by Dr. Anselm Pancoast. Right chest tube stable- tube to water seak, (-) air leak, sating at 94% on RA. Repeat CXR from yesterday afternoon and this AM revealed decreased trace right pneumothorax. Case discussed with Dr. Earleen Newport who recommends removal of chest tube. Right chest tube removed at bedside, no complications, EBL < 5 mL, pressure dressing applied to site. No need for repeat CXR following procedure per Dr. Earleen Newport. From IR standpoint, patient stable for discharge today. Instructed patient to return to ED if she notices signs of dyspnea/chest pain. Dr. Lonny Prude made aware.  Further plans per [facility]- appreciate and agree with management. Please call IR with questions/concerns.   Electronically Signed: Earley Abide, PA-C 11/23/2018, 12:02 PM   I spent a total of 35 Minutes at the the patient's bedside AND on the patient's hospital floor or unit, greater than 50% of which was counseling/coordinating care for right pneumothorax s/p right chest tube placement.

## 2018-11-23 NOTE — Discharge Instructions (Signed)
Pneumothorax A pneumothorax is commonly called a collapsed lung. It is a condition in which air leaks from a lung and builds up between the thin layer of tissue that covers the lungs (visceral pleura) and the interior wall of the chest cavity (parietal pleura). The air gets trapped outside the lung, between the lung and the chest wall (pleural space). The air takes up space and prevents the lung from fully expanding. This condition sometimes occurs suddenly with no apparent cause. The buildup of air may be small or large. A small pneumothorax may go away on its own. A large pneumothorax will require treatment and hospitalization. What are the causes? This condition may be caused by:  Trauma and injury to the chest wall.  Surgery and other medical procedures.  A complication of an underlying lung problem, especially chronic obstructive pulmonary disease (COPD) or emphysema. Sometimes the cause of this condition is not known. What increases the risk? You are more likely to develop this condition if:  You have an underlying lung problem.  You smoke.  You are 63-31 years old, female, tall, and underweight.  You have a personal or family history of pneumothorax.  You have an eating disorder (anorexia nervosa). This condition can also happen quickly, even in people with no history of lung problems. What are the signs or symptoms? Sometimes a pneumothorax will have no symptoms. When symptoms are present, they can include:  Chest pain.  Shortness of breath.  Increased rate of breathing.  Bluish color to your lips or skin (cyanosis). How is this diagnosed? This condition may be diagnosed by:  A medical history and physical exam.  A chest X-ray, chest CT scan, or ultrasound. How is this treated? Treatment depends on how severe your condition is. The goal of treatment is to remove the extra air and allow your lung to expand back to its normal size.  For a small pneumothorax: ? No  treatment may be needed. ? Extra oxygen is sometimes used to make it go away more quickly.  For a large pneumothorax or a pneumothorax that is causing symptoms, a procedure is done to drain the air from your lungs. To do this, a health care provider may use: ? A needle with a syringe. This is used to suck air from a pleural space where no additional leakage is taking place. ? A chest tube. This is used to suck air where there is ongoing leakage into the pleural space. The chest tube may need to remain in place for several days until the air leak has healed.  In more severe cases, surgery may be needed to repair the damage that is causing the leak.  If you have multiple pneumothorax episodes or have an air leak that will not heal, a procedure called a pleurodesis may be done. A medicine is placed in the pleural space to irritate the tissues around the lung so that the lung will stick to the chest wall, seal any leaks, and stop any buildup of air in that space. If you have an underlying lung problem, severe symptoms, or a large pneumothorax you will usually need to stay in the hospital. Follow these instructions at home: Lifestyle  Do not use any products that contain nicotine or tobacco, such as cigarettes and e-cigarettes. These are major risk factors in pneumothorax. If you need help quitting, ask your health care provider.  Do not lift anything that is heavier than 10 lb (4.5 kg), or the limit that your health care  provider tells you, until he or she says that it is safe.  Avoid activities that take a lot of effort (strenuous) for as long as told by your health care provider.  Return to your normal activities as told by your health care provider. Ask your health care provider what activities are safe for you.  Do not fly in an airplane or scuba dive until your health care provider says it is okay. General instructions  Take over-the-counter and prescription medicines only as told by your  health care provider.  If a cough or pain makes it difficult for you to sleep at night, try sleeping in a semi-upright position in a recliner or by using 2 or 3 pillows.  If you had a chest tube and it was removed, ask your health care provider when you can remove the bandage (dressing). While the dressing is in place, do not allow it to get wet.  Keep all follow-up visits as told by your health care provider. This is important. Contact a health care provider if:  You cough up thick mucus (sputum) that is yellow or green in color.  You were treated with a chest tube, and you have redness, increasing pain, or discharge at the site where it was placed. Get help right away if:  You have increasing chest pain or shortness of breath.  You have a cough that will not go away.  You begin coughing up blood.  You have pain that is getting worse or is not controlled with medicines.  The site where your chest tube was located opens up.  You feel air coming out of the site where the chest tube was placed.  You have a fever or persistent symptoms for more than 2-3 days.  You have a fever and your symptoms suddenly get worse. These symptoms may represent a serious problem that is an emergency. Do not wait to see if the symptoms will go away. Get medical help right away. Call your local emergency services (911 in the [place] Do not drive yourself to the hospital. Summary  A pneumothorax, commonly called a collapsed lung, is a condition in which air leaks from a lung and gets trapped between the lung and the chest wall (pleural space).  The buildup of air may be small or large. A small pneumothorax may go away on its own. A large pneumothorax will require treatment and hospitalization.  Treatment for this condition depends on how severe the pneumothorax is. The goal of treatment is to remove the extra air and allow the lung to expand back to its normal size. This information is not intended to  replace advice given to you by your health care provider. Make sure you discuss any questions you have with your health care provider. Document Released: 04/02/2005 Document Revised: 03/15/2017 Document Reviewed: 03/11/2017 Elsevier Patient Education  2020 [employer].

## 2018-11-23 NOTE — Plan of Care (Signed)

## 2018-11-23 NOTE — Progress Notes (Signed)
Pt got discharged to home, discharge instructions provided and patient showed understanding to it, IV taken out,Telemonitor DC,pt left unit in wheelchair with all of the belongings accompanied with a family member(Daughter)  Palma Holter, RN

## 2018-11-26 ENCOUNTER — Encounter: Payer: Self-pay | Admitting: *Deleted

## 2018-11-26 NOTE — Progress Notes (Signed)
Oncology Nurse Navigator Documentation  Oncology Nurse Navigator Flowsheets 11/26/2018  Navigator Location [facility]  Navigator Encounter Type Other/I received an update from Dr. Kipp Brood that Ms. Sieler has tissue DX.  Patient has seen Dr. Delton Coombes in the past so I reached out to his office with an update yesterday.  I have not heard back from [facility] so I called and left a vm message for the navigator to call me with my name and phone number to call.   Barriers/Navigation Needs Coordination of Care  Interventions Coordination of Care  Coordination of Care Other  Acuity Level 2  Time Spent with Patient 30

## 2018-11-27 ENCOUNTER — Other Ambulatory Visit: Payer: Self-pay | Admitting: *Deleted

## 2018-11-27 ENCOUNTER — Ambulatory Visit (HOSPITAL_COMMUNITY)
Admission: RE | Admit: 2018-11-27 | Discharge: 2018-11-27 | Disposition: A | Discharge status: Home / Self Care | Payer: Medicare Other | Source: Ambulatory Visit | Attending: Hematology | Admitting: Hematology

## 2018-11-27 ENCOUNTER — Other Ambulatory Visit: Payer: Self-pay

## 2018-11-27 ENCOUNTER — Inpatient Hospital Stay (HOSPITAL_COMMUNITY): Payer: Medicare Other | Attending: Hematology | Admitting: Hematology

## 2018-11-27 ENCOUNTER — Encounter (HOSPITAL_COMMUNITY): Payer: Self-pay | Admitting: Hematology

## 2018-11-27 ENCOUNTER — Other Ambulatory Visit (HOSPITAL_COMMUNITY): Payer: Self-pay | Admitting: *Deleted

## 2018-11-27 DIAGNOSIS — C3490 Malignant neoplasm of unspecified part of unspecified bronchus or lung: Secondary | ICD-10-CM | POA: Diagnosis present

## 2018-11-27 DIAGNOSIS — D473 Essential (hemorrhagic) thrombocythemia: Secondary | ICD-10-CM | POA: Diagnosis present

## 2018-11-27 DIAGNOSIS — R918 Other nonspecific abnormal finding of lung field: Secondary | ICD-10-CM | POA: Diagnosis not present

## 2018-11-27 DIAGNOSIS — C50912 Malignant neoplasm of unspecified site of left female breast: Secondary | ICD-10-CM | POA: Insufficient documentation

## 2018-11-27 DIAGNOSIS — C3491 Malignant neoplasm of unspecified part of right bronchus or lung: Secondary | ICD-10-CM | POA: Diagnosis not present

## 2018-11-27 MED ORDER — GADOBUTROL 1 MMOL/ML IV SOLN
4.0000 mL | Freq: Once | INTRAVENOUS | Status: AC | PRN
  Administered 2018-11-27: 4 mL via INTRAVENOUS

## 2018-11-27 NOTE — Progress Notes (Signed)
The proposed treatment discussed in cancer conference 11/27/2018 is for discussion purpose only and is not a binding recommendation.  The patient was not physically examined nor present for their treatment options.  Therefore, final treatment plans cannot be decided.

## 2018-11-27 NOTE — Assessment & Plan Note (Addendum)
1.  Small cell lung cancer: -PET CT scan on 10/24/2018 shows hypermetabolic right upper lobe pulmonary nodules suspicious for synchronous primary bronchogenic carcinomas. -She was evaluated by Dr. Kipp Brood.  She had a core needle biopsy of the right upper lobe on 11/21/2018 consistent with small cell carcinoma. -This was complicated by pneumothorax and she was hospitalized for chest tube. -We have discussed her case in the tumor board today.  She reportedly quit smoking 2 weeks ago.  She already had PFTs done. -She had an MRI this morning of the brain which was negative for metastatic disease.  I will make a referral to Dr. Kipp Brood for consideration of surgical resection.  If she is not a surgical candidate, she will receive chemoradiation therapy.  If she has resection of the cancer, she will require systemic chemotherapy.  2.  Essential thrombocytosis: -She takes hydroxyurea 500 mg Tuesday through Friday and 1000 mg on Saturday, Sunday and Monday. -She is tolerating it very well. - Last platelet count and hematocrit were in the target range.  3.  Left breast cancer: -Diagnosed in 1995 treated with chemotherapy. -Mammogram on 09/10/2018 of the right breast was BI-RADS Category 1.   4.  Melanoma: -She had melanoma resected from left upper thigh in 2015.  She follows up with dermatology Dr. Nevada Crane.

## 2018-11-27 NOTE — Progress Notes (Signed)
[place], [place] [address]   CLINIC:  Medical Oncology/Hematology  PCP:  [name], NP [address] [place] [address] [phone]   REASON FOR VISIT: Small cell lung cancer, essential thrombocytosis.  CURRENT THERAPY:Surgical resection versus combined modality therapy.    BRIEF ONCOLOGIC HISTORY:  Oncology History  Adenocarcinoma of left breast ([place])  09/29/1993 - 05/14/1994 Chemotherapy    AC Q 3 weeks X 6 cycles   01/02/1994 Surgery   Left modified radical mastectomy   01/02/1994 Pathology Results   ER-, PR - with a single positive LN found in the L axillae, Stage II disease   07/22/2015 Imaging   Bone Scan, No metastatic pattern uptake, degenerative changes in the lumbar spine with dextroscoliosis   05/25/2016 PET scan   No findings of active malignancy in the neck, chest, abdomen, or pelvis. The 3 liver lesions are not hypermetabolic. The radiologist suspects they may be subtly present on prior CT chest from 10/2013, to further reassuring that these are likely benign lesions.   Melanoma ([place])  07/09/2013 Initial Biopsy   Initial biopsy L upper thigh/buttocks melanoma   07/20/2013 Surgery   Excision L lateral buttock/upper thigh melanoma, clear margins   07/20/2013 Pathology Results   Breslow depth 1.02 mm, pT2a       INTERVAL HISTORY:  [name] 68 y.o. female seen for follow-up of lung cancer.  She had biopsy done on 11/21/2018 which was consistent with small cell lung cancer.  Unfortunately she had pneumothorax and was hospitalized for chest tube.  She is feeling better at this time.  She reported that she quit smoking 2 weeks ago.  Shortness of breath on exertion is stable.  Appetite is 75%.  Energy levels are low.  Denies any fevers or chills.  REVIEW OF SYSTEMS:  Review of Systems  Constitutional: Positive for fatigue.  Respiratory: Positive for shortness of breath.   All other systems reviewed and are negative.     PAST MEDICAL/SURGICAL HISTORY:  Past Medical History:  Diagnosis Date  . Adenocarcinoma of left breast ([place]) 01/09/2016  . Arthritis   . Ascending aortic aneurysm ([place])   . Cancer [facility]) 1995   breast, left, mastectomy/chemo  . Chest pain    Possibly cardiac. No evidence of ischemia/injury based upon normal troponin I. Chest discomfort could be tachycardia induced supply demand mismatch.   . CHF (congestive heart failure) ([place]) 11/17/2015   after surgery   . Colon adenomas   . Coronary artery disease   . Dyspnea    with exertion  . Emphysema of lung ([place]) 11/17/2015  . Essential hypertension, benign   . GERD (gastroesophageal reflux disease)   . Hyperlipidemia   . Hypertension   . Melanoma ([place]) 01/09/2016  . Palpitations   . Pernicious anemia 03/06/2016  . Pure hypercholesterolemia   . Thrombocythemia, essential ([place]) 01/09/2016  . Thrombocytosis ([place])    Idiopathic   Past Surgical History:  Procedure Laterality Date  . ANTERIOR AND POSTERIOR REPAIR N/A 12/09/2014   Procedure: ANTERIOR (CYSTOCELE) AND POSTERIOR REPAIR (RECTOCELE);  Surgeon: Bjorn Loser, MD;  Location: [facility];  Service: Urology;  Laterality: N/A;  . ANTERIOR CERVICAL DECOMPRESSION/DISCECTOMY FUSION 4 LEVELS Right 10/03/2016   Procedure: ANTERIOR CERVICAL DECOMPRESSION FUSION, CERVICAL 4-5, CERVICAL 5-6, CERVICAL 6-7, CERVICAL 7 TO THORACIC 1 WITH INSTRUMENTATION AND ALLOGRAFT;  Surgeon: Phylliss Bob, MD;  Location: [facility];  Service: Orthopedics;  Laterality: Right;  ANTERIOR CERVICAL DECOMPRESSION FUSION, CERVICAL 4-5,  CERVICAL 5-6, CERVICAL 6-7, CERVICAL 7 TO THORACIC 1 WITH INSTRUMENTATION AND ALLOGRAFT; REQUEST 4 HO  . ANTERIOR LAT LUMBAR FUSION Left 11/16/2015   Procedure: LEFT SIDED LATERAL INTERBODY FUSION, LUMBAR 2-3, LUMBAR 3-4, LUMBAR 4-5 WITH INSTRUMENTATION;  Surgeon: Phylliss Bob, MD;  Location: [facility];  Service: Orthopedics;  Laterality: Left;  LEFT SIDED LATEARL INTERBODY FUSION, LUMBAR 2-3,  LUMBAR 3-4, LUMBAR 4-5 WITH INSTRUMENTATION   . APPENDECTOMY    . BONE MARROW ASPIRATION  07/2012  . BONE MARROW BIOPSY  07/2012  . BREAST SURGERY    . CARDIAC CATHETERIZATION    . CARDIAC CATHETERIZATION N/A 01/20/2016   Procedure: Left Heart Cath and Coronary Angiography;  Surgeon: Burnell Blanks, MD;  Location: [facility];  Service: Cardiovascular;  Laterality: N/A;  . COLONOSCOPY  11/29/2010   Procedure: COLONOSCOPY;  Surgeon: Rogene Houston, MD;  [facility];  Service: Endoscopy;  Laterality: N/A;  . COLONOSCOPY N/A 02/18/2014   Procedure: COLONOSCOPY;  Surgeon: Rogene Houston, MD;  [facility];  Service: Endoscopy;  Laterality: N/A;  1030  . COLONOSCOPY N/A 02/25/2017   Procedure: COLONOSCOPY;  Surgeon: Rogene Houston, MD;  [facility];  Service: Endoscopy;  Laterality: N/A;  10:55  . CYSTOSCOPY N/A 12/09/2014   Procedure: CYSTOSCOPY;  Surgeon: Bjorn Loser, MD;  Location: [facility];  Service: Urology;  Laterality: N/A;  . ESOPHAGEAL DILATION N/A 02/25/2017   Procedure: ESOPHAGEAL DILATION;  Surgeon: Rogene Houston, MD;  [facility];  Service: Endoscopy;  Laterality: N/A;  . ESOPHAGOGASTRODUODENOSCOPY N/A 02/25/2017   Procedure: ESOPHAGOGASTRODUODENOSCOPY (EGD);  Surgeon: Rogene Houston, MD;  [facility];  Service: Endoscopy;  Laterality: N/A;  . IR GENERIC HISTORICAL  01/11/2016   IR RADIOLOGY PERIPHERAL GUIDED IV START 01/11/2016 Saverio Danker, PA-C [facility]  . IR GENERIC HISTORICAL  01/11/2016   IR US GUIDE VASC ACCESS RIGHT 01/11/2016 Saverio Danker, PA-C [facility]  . MASTECTOMY     left  . OVARIAN CYST SURGERY     x2  . POLYPECTOMY  02/25/2017   Procedure: POLYPECTOMY;  Surgeon: Rogene Houston, MD;  [facility];  Service: Endoscopy;;  . SALPINGOOPHORECTOMY Bilateral 12/09/2014   Procedure: SALPINGO OOPHORECTOMY;  Surgeon: Servando Salina, MD;  Location: [facility];  Service:  Gynecology;  Laterality: Bilateral;  . TUBAL LIGATION    . VAGINAL HYSTERECTOMY N/A 12/09/2014   Procedure: HYSTERECTOMY VAGINAL;  Surgeon: Servando Salina, MD;  Location: [facility];  Service: Gynecology;  Laterality: N/A;     SOCIAL HISTORY:  Social History   Socioeconomic History  . Marital status: Divorced    Spouse name: Not on file  . Number of children: 2  . Years of education: Not on file  . Highest education level: Not on file  Occupational History  . Not on file  Social Needs  . Financial resource strain: Not on file  . Food insecurity    Worry: Not on file    Inability: Not on file  . Transportation needs    Medical: Not on file    Non-medical: Not on file  Tobacco Use  . Smoking status: Current Every Day Smoker    Packs/day: 0.50    Years: 50.00    Pack years: 25.00    Types: Cigarettes  . Smokeless tobacco: Never Used  Substance and Sexual Activity  . Alcohol use: No  . Drug use: No  . Sexual activity: Yes  Birth control/protection: Post-menopausal  Lifestyle  . Physical activity    Days per week: Not on file    Minutes per session: Not on file  . Stress: Not on file  Relationships  . Social Herbalist on phone: Not on file    Gets together: Not on file    Attends religious service: Not on file    Active member of club or organization: Not on file    Attends meetings of clubs or organizations: Not on file    Relationship status: Not on file  . Intimate partner violence    Fear of current or ex partner: Not on file    Emotionally abused: Not on file    Physically abused: Not on file    Forced sexual activity: Not on file  Other Topics Concern  . Not on file  Social History Narrative  . Not on file    FAMILY HISTORY:  Family History  Problem Relation Age of Onset  . Hypertension Mother   . Heart failure Mother   . Congestive Heart Failure Mother   . COPD Mother   . Pernicious anemia Mother   . Cancer Mother        lung  .  Hypertension Father   . CAD Father   . Heart attack Father   . Hypertension Sister   . Cancer Other   . Celiac disease Other     CURRENT MEDICATIONS:  Outpatient Encounter Medications as of 11/27/2018  Medication Sig  . albuterol (PROVENTIL HFA;VENTOLIN HFA) 108 (90 Base) MCG/ACT inhaler Inhale 2 puffs into the lungs every 6 (six) hours as needed for wheezing or shortness of breath.  Marland Kitchen amLODipine (NORVASC) 2.5 MG tablet Take 2.5 mg by mouth daily.   Marland Kitchen aspirin EC 81 MG tablet Take 81 mg by mouth daily.  . Calcium Carb-Cholecalciferol (CALCIUM 600+D) 600-800 MG-UNIT TABS Take 1 tablet by mouth 2 (two) times daily.  . Cholecalciferol (VITAMIN D-3) 1000 units CAPS Take 1,000 Units daily by mouth.   . clonazePAM (KLONOPIN) 0.5 MG tablet Take 1 tablet (0.5 mg total) by mouth at bedtime.  . cyanocobalamin (,VITAMIN B-12,) 1000 MCG/ML injection INJECT 1 ML INTO THE MUSCLE ONCE MONTHLY AS DIRECTED. (Patient taking differently: Inject 1,000 mcg into the muscle every 30 (thirty) days. )  . furosemide (LASIX) 40 MG tablet Take 20-40 mg by mouth See admin instructions. Take 40 mg in the morning and 20 mg at night  . gabapentin (NEURONTIN) 300 MG capsule Take 300 mg by mouth at bedtime.  . hydroxyurea (HYDREA) 500 MG capsule TAKE 1 CAPSULE DAILY TUESDAY-FRIDAY AND 2 CAPSULES ON SATURDAY-MONDAY. (Patient taking differently: Take 500-1,000 mg by mouth See admin instructions. Take 500 mg at night on Tue, Wed, Thur, and Fri, take 1000 mg at night on Sat, Sun, and Mon)  . ibandronate (BONIVA) 150 MG tablet Take 1 tablet by mouth every 30 (thirty) days.  Marland Kitchen ibuprofen (ADVIL,MOTRIN) 800 MG tablet Take 800 mg every 8 (eight) hours as needed by mouth for moderate pain.  Marland Kitchen ipratropium-albuterol (DUONEB) 0.5-2.5 (3) MG/3ML SOLN Take 3 mLs by nebulization every 6 (six) hours as needed (shortness of breath).  . methocarbamol (ROBAXIN) 500 MG tablet Take 500 mg by mouth every 6 (six) hours as needed for muscle spasms.    . nitroGLYCERIN (NITRODUR - DOSED IN MG/24 HR) 0.1 mg/hr patch Place 0.1 mg onto the skin daily.   . ondansetron (ZOFRAN) 8 MG tablet Take 1 tablet (8  mg total) by mouth every 8 (eight) hours as needed for nausea or vomiting.  . pantoprazole (PROTONIX) 40 MG tablet Take 40 mg daily by mouth.  . polyethylene glycol (MIRALAX / GLYCOLAX) 17 g packet Take 17 g by mouth daily as needed for mild constipation.  . potassium chloride SA (K-DUR,KLOR-CON) 20 MEQ tablet Take 1 tablet (20 mEq total) by mouth 3 (three) times daily. (Patient taking differently: Take 60 mEq by mouth at bedtime. )  . rosuvastatin (CRESTOR) 40 MG tablet Take 40 mg at bedtime by mouth.   . traMADol (ULTRAM) 50 MG tablet Take 50 mg by mouth at bedtime as needed for moderate pain.    No facility-administered encounter medications on file as of 11/27/2018.     ALLERGIES:  Allergies  Allergen Reactions  . Penicillins Hives and Rash    Did it involve swelling of the face/tongue/throat, SOB, or low BP? No Did it involve sudden or severe rash/hives, skin peeling, or any reaction on the inside of your mouth or nose? No Did you need to seek medical attention at a hospital or doctor's office? No When did it last happen?5-10 year If all above answers are "NO", may proceed with cephalosporin use.    . Tape Rash    Medipore, Coban, and paper tape CAN be tolerated     PHYSICAL EXAM:  ECOG Performance status: 1  Vitals:   11/27/18 1115  BP: 124/79  Pulse: 78  Resp: 14  Temp: (!) 97 F (36.1 C)  SpO2: 100%   Filed Weights   11/27/18 1115  Weight: 110 lb (49.9 kg)    Physical Exam Constitutional:      Appearance: Normal appearance. She is normal weight.  Cardiovascular:     Rate and Rhythm: Normal rate and regular rhythm.     Heart sounds: Normal heart sounds.  Pulmonary:     Effort: Pulmonary effort is normal.     Breath sounds: Normal breath sounds.  Abdominal:     General: Abdomen is flat. There is no  distension.     Palpations: Abdomen is soft. There is no mass.  Musculoskeletal: Normal range of motion.  Skin:    General: Skin is warm and dry.  Neurological:     Mental Status: She is alert and oriented to person, place, and time. Mental status is at baseline.  Psychiatric:        Mood and Affect: Mood normal.        Behavior: Behavior normal.        Thought Content: Thought content normal.        Judgment: Judgment normal.    No palpable splenomegaly.  No leg ulcers.  LABORATORY DATA:  I have reviewed the labs as listed.  CBC    Component Value Date/Time   WBC 4.1 11/22/2018 0225   RBC 2.73 (L) 11/22/2018 0225   HGB 10.0 (L) 11/22/2018 0225   HCT 30.2 (L) 11/22/2018 0225   PLT 304 11/22/2018 0225   MCV 110.6 (H) 11/22/2018 0225   MCH 36.6 (H) 11/22/2018 0225   MCHC 33.1 11/22/2018 0225   RDW 14.2 11/22/2018 0225   LYMPHSABS 1.5 10/07/2018 1116   MONOABS 0.6 10/07/2018 1116   EOSABS 0.3 10/07/2018 1116   BASOSABS 0.0 10/07/2018 1116   CMP Latest Ref Rng & Units 11/22/2018 11/21/2018 10/07/2018  Glucose 70 - 99 mg/dL 97 118(H) -  BUN 8 - 23 mg/dL 12 13 -  Creatinine 0.44 - 1.00 mg/dL 0.98 0.91 1.90(H)  Sodium 135 - 145 mmol/L 136 136 -  Potassium 3.5 - 5.1 mmol/L 3.9 3.5 -  Chloride 98 - 111 mmol/L 103 102 -  CO2 22 - 32 mmol/L 23 24 -  Calcium 8.9 - 10.3 mg/dL 8.0(L) 8.1(L) -  Total Protein 6.5 - 8.1 g/dL - - -  Total Bilirubin 0.3 - 1.2 mg/dL - - -  Alkaline Phos 38 - 126 U/L - - -  AST 15 - 41 U/L - - -  ALT 0 - 44 U/L - - -       DIAGNOSTIC IMAGING:  I have independently reviewed the scans and discussed with the patient.     ASSESSMENT & PLAN:   Small cell lung cancer, right ([place]) 1.  Small cell lung cancer: -PET CT scan on 10/24/2018 shows hypermetabolic right upper lobe pulmonary nodules suspicious for synchronous primary bronchogenic carcinomas. -She was evaluated by Dr. Kipp Brood.  She had a core needle biopsy of the right upper lobe on 11/21/2018  consistent with small cell carcinoma. -This was complicated by pneumothorax and she was hospitalized for chest tube. -We have discussed her case in the tumor board today.  She reportedly quit smoking 2 weeks ago.  She already had PFTs done. -She had an MRI this morning of the brain which was negative for metastatic disease.  I will make a referral to Dr. Kipp Brood for consideration of surgical resection.  If she is not a surgical candidate, she will receive chemoradiation therapy.  If she has resection of the cancer, she will require systemic chemotherapy.  2.  Essential thrombocytosis: -She takes hydroxyurea 500 mg Tuesday through Friday and 1000 mg on Saturday, Sunday and Monday. -She is tolerating it very well. - Last platelet count and hematocrit were in the target range.  3.  Left breast cancer: -Diagnosed in 1995 treated with chemotherapy. -Mammogram on 09/10/2018 of the right breast was BI-RADS Category 1.   4.  Melanoma: -She had melanoma resected from left upper thigh in 2015.  She follows up with dermatology Dr. Nevada Crane.   Total time spent is 40 minutes with more than 50% of the time spent face-to-face discussing biopsy results, scan results, further plan, counseling and coordination of care.  Orders placed this encounter:  No orders of the defined types were placed in this encounter.     Derek Jack, MD [place] [phone]

## 2018-11-27 NOTE — Patient Instructions (Addendum)
[place] at [facility] Discharge Instructions  You were seen today by Dr. Delton Coombes. He went over your recent results. He will see you back in 6 weeks  for labs and follow up.   Thank you for choosing [place] at [facility] to provide your oncology and hematology care.  To afford each patient quality time with our provider, please arrive at least 15 minutes before your scheduled appointment time.   If you have a lab appointment with the [facility] please come in thru the  Main Entrance and check in at the main information desk  You need to re-schedule your appointment should you arrive 10 or more minutes late.  We strive to give you quality time with our providers, and arriving late affects you and other patients whose appointments are after yours.  Also, if you no show three or more times for appointments you may be dismissed from the clinic at the providers discretion.     Again, thank you for choosing [facility].  Our hope is that these requests will decrease the amount of time that you wait before being seen by our physicians.       _____________________________________________________________  Should you have questions after your visit to [facility], please contact our office at [phone] between the hours of 8:00 a.m. and 4:30 p.m.  Voicemails left after 4:00 p.m. will not be returned until the following business day.  For prescription refill requests, have your pharmacy contact our office and allow 72 hours.    Cancer Center Support Programs:   > Cancer Support Group  2nd Tuesday of the month 1pm-2pm, Journey Room

## 2018-11-28 ENCOUNTER — Ambulatory Visit: Payer: Medicare Other | Admitting: Thoracic Surgery (Cardiothoracic Vascular Surgery)

## 2018-12-12 ENCOUNTER — Encounter: Payer: Self-pay | Admitting: *Deleted

## 2018-12-12 ENCOUNTER — Other Ambulatory Visit: Payer: Self-pay | Admitting: *Deleted

## 2018-12-12 ENCOUNTER — Other Ambulatory Visit: Payer: Self-pay | Admitting: Thoracic Surgery (Cardiothoracic Vascular Surgery)

## 2018-12-12 ENCOUNTER — Encounter: Payer: Self-pay | Admitting: Thoracic Surgery (Cardiothoracic Vascular Surgery)

## 2018-12-12 ENCOUNTER — Other Ambulatory Visit: Payer: Self-pay

## 2018-12-12 ENCOUNTER — Ambulatory Visit (INDEPENDENT_AMBULATORY_CARE_PROVIDER_SITE_OTHER): Payer: Medicare Other | Admitting: Thoracic Surgery (Cardiothoracic Vascular Surgery)

## 2018-12-12 VITALS — BP 120/77 | HR 65 | Temp 97.5°F | Resp 20 | Ht 62.0 in | Wt 111.0 lb

## 2018-12-12 DIAGNOSIS — R911 Solitary pulmonary nodule: Secondary | ICD-10-CM

## 2018-12-12 DIAGNOSIS — C3491 Malignant neoplasm of unspecified part of right bronchus or lung: Secondary | ICD-10-CM | POA: Diagnosis not present

## 2018-12-12 DIAGNOSIS — I428 Other cardiomyopathies: Secondary | ICD-10-CM

## 2018-12-12 DIAGNOSIS — Z853 Personal history of malignant neoplasm of breast: Secondary | ICD-10-CM | POA: Diagnosis not present

## 2018-12-12 DIAGNOSIS — Z01818 Encounter for other preprocedural examination: Secondary | ICD-10-CM

## 2018-12-12 NOTE — Progress Notes (Signed)
[address]       [place] [address]             [phone]                                                   [facility] Medical Record #[id number] Date of Birth: 02-02-1951  Referring: Derek Jack, MD Primary Care: [name], NP Primary Cardiologist: No primary care provider on file.  Chief Complaint:   No chief complaint on file.   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  Her case was discussed in tumor board, and the decision was made to proceed with surgical resection given that it is localized to the right upper lobe.     Per my last note YARNELL KOZLOSKI 68 y.o. female is seen in the office  today for evaluation of right upper lobe pulmonary nodules.  She has a history of breast adenocarcinoma that was treated in 1995 with a mastectomy, and no radiation.  She has a long history of dyspnea, and exertional chest pain.  Currently she can only walk about 80mn prior to having chest pain.  It is relieved with rest, but she has been given nitro patches by her cardiologist as well.  She continues to smoke, and has a chronic cough.  When she underwent spine surgery back in 2017, she developed respiratory failure.  She also lost 50 lbs, and has been unable to regain that weight.     Smoking Hx:  Last cigarette was today   Current Activity/ Functional Status:  Patient is independent with mobility/ambulation, transfers, ADL's, IADL's.   Zubrod Score: At the time of surgery this patient's most appropriate activity status/level should be described as: _0 ?    0    Normal activity, no symptoms _1 ?    1    Restricted in physical strenuous activity but ambulatory, able to do out light work _2 ?    2    Ambulatory and capable of self care, unable to do work activities, up and about               >50 % of waking hours                              _3 ?     3    Only limited self care, in bed greater than 50% of waking hours _4 ?    4    Completely disabled, no self care, confined to bed or chair _5 ?    5    Moribund       Past Medical History:  Diagnosis Date  . Adenocarcinoma of left breast ([facility] 01/09/2016  . Arthritis   . Ascending aortic aneurysm ([facility]   . Cancer ([facility] 1995   breast, left, mastectomy/chemo  . Chest pain    Possibly cardiac. No evidence of ischemia/injury based upon normal troponin I. Chest discomfort could be tachycardia induced supply demand mismatch.   . CHF (congestive heart failure) ([facility] 11/17/2015   after surgery   . Colon adenomas   . Coronary artery disease   . Dyspnea    with exertion  .  Emphysema of lung ([place]) 11/17/2015  . Essential hypertension, benign   . GERD (gastroesophageal reflux disease)   . Hyperlipidemia   . Hypertension   . Melanoma ([place]) 01/09/2016  . Palpitations   . Pernicious anemia 03/06/2016  . Pure hypercholesterolemia   . Thrombocythemia, essential ([place]) 01/09/2016  . Thrombocytosis ([place])    Idiopathic         Past Surgical History:  Procedure Laterality Date  . ANTERIOR AND POSTERIOR REPAIR N/A 12/09/2014   Procedure: ANTERIOR (CYSTOCELE) AND POSTERIOR REPAIR (RECTOCELE);  Surgeon: Bjorn Loser, MD;  Location: [facility];  Service: Urology;  Laterality: N/A;  . ANTERIOR CERVICAL DECOMPRESSION/DISCECTOMY FUSION 4 LEVELS Right 10/03/2016   Procedure: ANTERIOR CERVICAL DECOMPRESSION FUSION, CERVICAL 4-5, CERVICAL 5-6, CERVICAL 6-7, CERVICAL 7 TO THORACIC 1 WITH INSTRUMENTATION AND ALLOGRAFT;  Surgeon: Phylliss Bob, MD;  Location: [facility];  Service: Orthopedics;  Laterality: Right;  ANTERIOR CERVICAL DECOMPRESSION FUSION, CERVICAL 4-5, CERVICAL 5-6, CERVICAL 6-7, CERVICAL 7 TO THORACIC 1 WITH INSTRUMENTATION AND ALLOGRAFT; REQUEST 4 HO  . ANTERIOR LAT LUMBAR FUSION Left 11/16/2015   Procedure: LEFT SIDED LATERAL INTERBODY FUSION, LUMBAR 2-3, LUMBAR 3-4, LUMBAR 4-5  WITH INSTRUMENTATION;  Surgeon: Phylliss Bob, MD;  Location: [facility];  Service: Orthopedics;  Laterality: Left;  LEFT SIDED LATEARL INTERBODY FUSION, LUMBAR 2-3, LUMBAR 3-4, LUMBAR 4-5 WITH INSTRUMENTATION   . APPENDECTOMY    . BONE MARROW ASPIRATION  07/2012  . BONE MARROW BIOPSY  07/2012  . BREAST SURGERY    . CARDIAC CATHETERIZATION    . CARDIAC CATHETERIZATION N/A 01/20/2016   Procedure: Left Heart Cath and Coronary Angiography;  Surgeon: Burnell Blanks, MD;  Location: [facility];  Service: Cardiovascular;  Laterality: N/A;  . COLONOSCOPY  11/29/2010   Procedure: COLONOSCOPY;  Surgeon: Rogene Houston, MD;  [facility];  Service: Endoscopy;  Laterality: N/A;  . COLONOSCOPY N/A 02/18/2014   Procedure: COLONOSCOPY;  Surgeon: Rogene Houston, MD;  [facility];  Service: Endoscopy;  Laterality: N/A;  1030  . COLONOSCOPY N/A 02/25/2017   Procedure: COLONOSCOPY;  Surgeon: Rogene Houston, MD;  [facility];  Service: Endoscopy;  Laterality: N/A;  10:55  . CYSTOSCOPY N/A 12/09/2014   Procedure: CYSTOSCOPY;  Surgeon: Bjorn Loser, MD;  Location: [facility];  Service: Urology;  Laterality: N/A;  . ESOPHAGEAL DILATION N/A 02/25/2017   Procedure: ESOPHAGEAL DILATION;  Surgeon: Rogene Houston, MD;  [facility];  Service: Endoscopy;  Laterality: N/A;  . ESOPHAGOGASTRODUODENOSCOPY N/A 02/25/2017   Procedure: ESOPHAGOGASTRODUODENOSCOPY (EGD);  Surgeon: Rogene Houston, MD;  [facility];  Service: Endoscopy;  Laterality: N/A;  . IR GENERIC HISTORICAL  01/11/2016   IR RADIOLOGY PERIPHERAL GUIDED IV START 01/11/2016 Saverio Danker, PA-C [facility]  . IR GENERIC HISTORICAL  01/11/2016   IR US GUIDE VASC ACCESS RIGHT 01/11/2016 Saverio Danker, PA-C [facility]  . MASTECTOMY     left  . OVARIAN CYST SURGERY     x2  . POLYPECTOMY  02/25/2017   Procedure: POLYPECTOMY;  Surgeon: Rogene Houston, MD;   [facility];  Service: Endoscopy;;  . SALPINGOOPHORECTOMY Bilateral 12/09/2014   Procedure: SALPINGO OOPHORECTOMY;  Surgeon: Servando [place], MD;  Location: [facility];  Service: Gynecology;  Laterality: Bilateral;  . TUBAL LIGATION    . VAGINAL HYSTERECTOMY N/A 12/09/2014   Procedure: HYSTERECTOMY VAGINAL;  Surgeon: Servando [place], MD;  Location: [facility];  Service: Gynecology;  Laterality: N/A;  Family History  Problem Relation Age of Onset  . Hypertension Mother   . Heart failure Mother   . Congestive Heart Failure Mother   . COPD Mother   . Pernicious anemia Mother   . Cancer Mother        lung  . Hypertension Father   . CAD Father   . Heart attack Father   . Hypertension Sister   . Cancer Other   . Celiac disease Other      Social History       Tobacco Use  Smoking Status Current Every Day Smoker  . Packs/day: 0.50  . Years: 50.00  . Pack years: 25.00  . Types: Cigarettes  Smokeless Tobacco Never Used    Social History      Substance and Sexual Activity  Alcohol Use No     Allergies  Allergen Reactions  . Penicillins Hives and Rash    Has patient had a PCN reaction causing immediate rash, facial/tongue/throat swelling, SOB or lightheadedness with hypotension: Yes Has patient had a PCN reaction causing severe rash involving mucus membranes or skin necrosis: Yes Has patient had a PCN reaction that required hospitalization No Has patient had a PCN reaction occurring within the last 10 years: Yes If all of the above answers are "NO", then may proceed with Cephalosporin use.   . Tape Rash    Medipore, Coban, and paper tape CAN be tolerated          Current Outpatient Medications  Medication Sig Dispense Refill  . albuterol (PROVENTIL HFA;VENTOLIN HFA) 108 (90 Base) MCG/ACT inhaler Inhale 2 puffs into the lungs every 6 (six) hours as needed for wheezing or shortness of breath. 1 Inhaler 2  . amLODipine  (NORVASC) 2.5 MG tablet Take 2.5 mg by mouth daily.     Marland Kitchen aspirin EC 81 MG tablet Take 81 mg by mouth daily.    . Calcium Carb-Cholecalciferol (CALCIUM 600+D) 600-800 MG-UNIT TABS Take 1 tablet by mouth 2 (two) times daily.    . Cholecalciferol (VITAMIN D-3) 1000 units CAPS Take 1,000 Units daily by mouth.     . clonazePAM (KLONOPIN) 0.5 MG tablet Take 1 tablet (0.5 mg total) by mouth at bedtime. 90 tablet 2  . cyanocobalamin (,VITAMIN B-12,) 1000 MCG/ML injection INJECT 1 ML INTO THE MUSCLE ONCE MONTHLY AS DIRECTED. 1 mL 5  . furosemide (LASIX) 40 MG tablet Take 40 mg by mouth daily. Can go up to 80 mg daily per pt    . gabapentin (NEURONTIN) 300 MG capsule Take 300 mg by mouth at bedtime.    . hydroxyurea (HYDREA) 500 MG capsule TAKE 1 CAPSULE DAILY TUESDAY-FRIDAY AND 2 CAPSULES ON SATURDAY-MONDAY. 120 capsule 4  . ibandronate (BONIVA) 150 MG tablet Take 1 tablet by mouth every 30 (thirty) days.    Marland Kitchen ibuprofen (ADVIL,MOTRIN) 800 MG tablet Take 800 mg every 8 (eight) hours as needed by mouth for moderate pain.    Marland Kitchen ipratropium-albuterol (DUONEB) 0.5-2.5 (3) MG/3ML SOLN Take 3 mLs by nebulization every 6 (six) hours as needed (shortness of breath). 360 mL 0  . methocarbamol (ROBAXIN) 500 MG tablet Take 500 mg daily as needed by mouth for muscle spasms.     . nitroGLYCERIN (NITRODUR - DOSED IN MG/24 HR) 0.1 mg/hr patch Place 0.1 mg onto the skin daily.     . ondansetron (ZOFRAN) 8 MG tablet Take 1 tablet (8 mg total) by mouth every 8 (eight) hours as needed for nausea or vomiting. 20 tablet  5  . pantoprazole (PROTONIX) 40 MG tablet Take 40 mg daily by mouth.    . potassium chloride SA (K-DUR,KLOR-CON) 20 MEQ tablet Take 1 tablet (20 mEq total) by mouth 3 (three) times daily. 90 tablet 5  . rosuvastatin (CRESTOR) 40 MG tablet Take 40 mg at bedtime by mouth.     . traMADol (ULTRAM) 50 MG tablet Take 50 mg by mouth as needed.      No current facility-administered  medications for this visit.      Review of Systems:  Constitutional: weight loss Cardiovascular: exertional chest pain, lower extremity swelling Pulmonary: dyspnea, cough GI: constipation MSK: back pain Neuro: no headaches     PHYSICAL EXAMINATION: Today's Vitals   12/12/18 1129  BP: 120/77  Pulse: 65  Resp: 20  Temp: (!) 97.5 F (36.4 C)  TempSrc: Skin  SpO2: 95%  Weight: 111 lb (50.3 kg)  Height: '5\' 2"'$  (1.575 m)   Body mass index is 20.3 kg/m.  General: non-toxic, appears older stated age.  Frail HEENT: NCAT.  Moist mucous membranes, Oropharynx clear.  No cervical or supraclavicular adenopathy. Neuro: Alert, Oriented X 3. Non focal . Psych: appropriate mood. Cardiovascular: RRR, no murmur Pulmonary:  Clear B.  No crackles GI: soft, ND MSK: ambulates well.   Diagnostic Studies & Laboratory data:     Recent Radiology Findings:    Imaging Results  Ct Chest Wo Contrast  Result Date: 10/14/2018 CLINICAL DATA:  LEFT upper lobe pulmonary nodule. History of breast cancer EXAM: CT CHEST WITHOUT CONTRAST TECHNIQUE: Multidetector CT imaging of the chest was performed following the standard protocol without IV contrast. COMPARISON:  CT 04/15/2017 PET-CT 07/29/2017 FINDINGS: Cardiovascular: Coronary artery calcification and aortic atherosclerotic calcification. Ascending aorta measures 42 mm similar to comparison exam Mediastinum/Nodes: No axillary or supraclavicular lymphadenopathy. No mediastinal or hilar adenopathy. Lungs/Pleura: Spiculated nodule in the RIGHT upper lobe measures 12 mm x 9 mm (image 63/4) compared to 10 mm x 5 mm. Lesion is increased in density. More inferiorly lobular pleural base nodule measuring 17 mm by 7 mm (image 72/4) is new from prior. Additional RIGHT upper lobe peripheral nodule measures 8 mm on image 60/4 is new from prior. In LEFT upper lobe 3 mm nodule and 3 mm nodule image 28/4 new from prior. Benign appearing nodule along the RIGHT  oblique fissure measures 7 mm unchanged. Upper Abdomen: Limited view of the liver, kidneys, pancreas are unremarkable. Normal adrenal glands. Musculoskeletal: Posterior lumbar fusion IMPRESSION: 1. Enlarging RIGHT upper lobe spiculated pulmonary nodule and new adjacent pleural base nodule. Recommend FDG PET scan to determine malignant potential. 2. Several additional peripheral nodules in LEFT and RIGHT lung are new from prior. Recommend attention on follow-up. 3. Stable aneurysmal dilatation of  ascending aorta. Electronically Signed   By: Suzy Bouchard M.D.   On: 10/14/2018 12:37   Nm Pet Image Restag (ps) Skull Base To Thigh  Result Date: 10/24/2018 CLINICAL DATA:  Subsequent treatment strategy for new right upper lobe pulmonary nodules. Remote history of breast cancer. EXAM: NUCLEAR MEDICINE PET SKULL BASE TO THIGH TECHNIQUE: 5.5 mCi F-18 FDG was injected intravenously. Full-ring PET imaging was performed from the skull base to thigh after the radiotracer. CT data was obtained and used for attenuation correction and anatomic localization. Fasting blood glucose: 137 mg/dl COMPARISON:  Chest CT 10/14/2018. CTA of the chest, abdomen and pelvis 04/15/2018. FINDINGS: Mediastinal blood pool activity: SUV max 2.2 Liver activity: SUV max NA NECK: No areas of abnormal hypermetabolism. Incidental  CT findings: No cervical adenopathy. Left carotid atherosclerosis. CHEST: Hypermetabolism corresponding to the right upper lobe spiculated pulmonary nodule. This measures a 1.0 cm and a S.U.V. max of 8.0 on image 33/8. Just inferior and lateral to this, a pleural-based nodule measures 1.8 cm and a S.U.V. max of 10.0 on 37/8. No thoracic nodal hypermetabolism. Incidental CT findings: Deferred to recent diagnostic CT. Advanced bullous type emphysema. Other pulmonary nodules detailed on the prior exam are below PET resolution. Ascending aortic dilatation has been detailed previously. Tortuous thoracic aorta. Cardiomegaly.  Aortic and coronary artery atherosclerosis. No axillary adenopathy. ABDOMEN/PELVIS: No abdominopelvic parenchymal or nodal hypermetabolism. Incidental CT findings: Right adrenal enlargement is likely related to hyperplasia. Mild chronic left adrenal nodularity. Abdominal aortic atherosclerosis. Low-density bilateral renal lesions are likely cysts. There also subcentimeter hyperattenuating right renal lesions which are indeterminate. Hyperattenuation within the left renal collecting system measures 1.7 cm on image 119/4. This may have been present on prior exams including back to 01/21/2017. Large colonic stool burden. Pelvic floor laxity. Hysterectomy. Pancreatic parenchymal calcifications likely represent chronic calcific pancreatitis. SKELETON: Hypermetabolism about the left facets at approximately the C3-4 level is just cephalad to surgical changes and favored to be degenerative. Incidental CT findings: Cervical and lumbar spine fixation. IMPRESSION: 1. Hypermetabolic right upper lobe pulmonary nodules which are suspicious for synchronous primary bronchogenic carcinomas. Metastatic disease felt less likely. 2. No evidence of thoracic nodal hypermetabolism or typical findings of distant metastasis. 3. Aortic atherosclerosis (ICD10-I70.0), coronary artery atherosclerosis and emphysema (ICD10-J43.9). 4. Ascending aortic aneurysm, as detailed on prior exams. 5. Hyperattenuation in the interpolar left renal collecting system. This is felt to have been present over prior exams, favoring a benign etiology (i.e. The sequelae of prior infection). Consider correlation with urinalysis. Recommend attention on follow-up. Electronically Signed   By: Abigail Miyamoto M.D.   On: 10/24/2018 16:59    MRI brain 11/27/18 - No evidence of intracranial metastatic disease.  - A few small scattered signal changes within the cerebral white matter are nonspecific, but consistent with chronic small vessel ischemic disease.  -  Apparent signal abnormality within the partially imaged C3 vertebra, although this may be related to susceptibility artifact from spinal fusion hardware immediately beneath this level.    I have independently reviewed the above radiology studies  and reviewed the findings with the patient.   Recent Lab Findings: Recent Labs       Lab Results  Component Value Date   WBC 6.5 10/07/2018   HGB 11.3 (L) 10/07/2018   HCT 34.3 (L) 10/07/2018   PLT 390 10/07/2018   GLUCOSE 99 10/07/2018   ALT 15 10/07/2018   AST 24 10/07/2018   NA 137 10/07/2018   K 3.9 10/07/2018   CL 100 10/07/2018   CREATININE 1.90 (H) 10/07/2018   BUN 25 (H) 10/07/2018   CO2 26 10/07/2018   INR 0.99 10/01/2017       PFTs: 11/03/18 - FVC: 107% - FEV1: 82% -DLCO: 48%    Problem List:  - CT chest 10/14/18 PET/CT 10/24/18 -              1.2cm right upper lobe spiculated nodule.  Increased in sized.  SUV 8             1.7cm right upper lobe pleural based nodule. 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  Localized disease, marginal pulmonary function.  I explained to her that prior to resection, we will assess the lymph nodes intraoperatively.  If positive, the lobectomy will be aborted.  Also given her marginal lung function, there is a good chance that she will require supplemental O2 following the resection.  She is tentatively scheduled for a bronchoscopy, Right VATS, mediastinal lymph node sampling, and right upper lobectomy in 2 weeks.  She will also get a stress test in the next few weeks.  I  spent 30 minutes with  the patient face to face and greater  then 50% of the time was spent in counseling and coordination of care.    Kimmie Doren Bary Leriche

## 2018-12-23 ENCOUNTER — Telehealth (HOSPITAL_COMMUNITY): Payer: Self-pay

## 2018-12-23 NOTE — Telephone Encounter (Signed)
Encounter complete. 

## 2018-12-25 ENCOUNTER — Ambulatory Visit (HOSPITAL_COMMUNITY)
Admission: RE | Admit: 2018-12-25 | Discharge: 2018-12-25 | Disposition: A | Discharge status: Home / Self Care | Payer: Medicare Other | Source: Ambulatory Visit | Attending: Cardiology | Admitting: Cardiology

## 2018-12-25 ENCOUNTER — Other Ambulatory Visit: Payer: Self-pay

## 2018-12-25 DIAGNOSIS — I428 Other cardiomyopathies: Secondary | ICD-10-CM | POA: Diagnosis present

## 2018-12-25 LAB — MYOCARDIAL PERFUSION IMAGING
LV dias vol: 104 mL (ref 46–106)
LV sys vol: 50 mL
Peak HR: 80 {beats}/min
Rest HR: 53 {beats}/min
SDS: 0
SRS: 0
SSS: 0
TID: 1.13

## 2018-12-25 MED ORDER — REGADENOSON 0.4 MG/5ML IV SOLN
0.4000 mg | Freq: Once | INTRAVENOUS | Status: AC
  Administered 2018-12-25: 0.4 mg via INTRAVENOUS

## 2018-12-25 MED ORDER — TECHNETIUM TC 99M TETROFOSMIN IV KIT
31.2000 | PACK | Freq: Once | INTRAVENOUS | Status: AC | PRN
  Administered 2018-12-25: 31.2 via INTRAVENOUS
  Filled 2018-12-25: qty 32

## 2018-12-25 MED ORDER — TECHNETIUM TC 99M TETROFOSMIN IV KIT
9.7000 | PACK | Freq: Once | INTRAVENOUS | Status: AC | PRN
  Administered 2018-12-25: 9.7 via INTRAVENOUS
  Filled 2018-12-25: qty 10

## 2018-12-26 ENCOUNTER — Ambulatory Visit (HOSPITAL_COMMUNITY)
Admission: RE | Admit: 2018-12-26 | Discharge: 2018-12-26 | Disposition: A | Discharge status: Home / Self Care | Payer: Medicare Other | Source: Ambulatory Visit | Attending: Thoracic Surgery (Cardiothoracic Vascular Surgery) | Admitting: Thoracic Surgery (Cardiothoracic Vascular Surgery)

## 2018-12-26 ENCOUNTER — Other Ambulatory Visit: Payer: Self-pay

## 2018-12-26 ENCOUNTER — Other Ambulatory Visit (HOSPITAL_COMMUNITY)
Admission: RE | Admit: 2018-12-26 | Discharge: 2018-12-26 | Disposition: A | Discharge status: Home / Self Care | Payer: Medicare Other | Source: Ambulatory Visit

## 2018-12-26 ENCOUNTER — Encounter (HOSPITAL_COMMUNITY): Payer: Self-pay

## 2018-12-26 ENCOUNTER — Encounter (HOSPITAL_COMMUNITY)
Admission: RE | Admit: 2018-12-26 | Discharge: 2018-12-26 | Disposition: A | Discharge status: Home / Self Care | Payer: Medicare Other | Source: Ambulatory Visit | Attending: Thoracic Surgery (Cardiothoracic Vascular Surgery) | Admitting: Thoracic Surgery (Cardiothoracic Vascular Surgery)

## 2018-12-26 DIAGNOSIS — I2510 Atherosclerotic heart disease of native coronary artery without angina pectoris: Secondary | ICD-10-CM | POA: Insufficient documentation

## 2018-12-26 DIAGNOSIS — E785 Hyperlipidemia, unspecified: Secondary | ICD-10-CM | POA: Diagnosis not present

## 2018-12-26 DIAGNOSIS — Z79899 Other long term (current) drug therapy: Secondary | ICD-10-CM | POA: Diagnosis not present

## 2018-12-26 DIAGNOSIS — Z20828 Contact with and (suspected) exposure to other viral communicable diseases: Secondary | ICD-10-CM | POA: Diagnosis not present

## 2018-12-26 DIAGNOSIS — K219 Gastro-esophageal reflux disease without esophagitis: Secondary | ICD-10-CM | POA: Insufficient documentation

## 2018-12-26 DIAGNOSIS — J439 Emphysema, unspecified: Secondary | ICD-10-CM | POA: Diagnosis not present

## 2018-12-26 DIAGNOSIS — C3411 Malignant neoplasm of upper lobe, right bronchus or lung: Secondary | ICD-10-CM | POA: Insufficient documentation

## 2018-12-26 DIAGNOSIS — R911 Solitary pulmonary nodule: Secondary | ICD-10-CM

## 2018-12-26 DIAGNOSIS — Z01818 Encounter for other preprocedural examination: Secondary | ICD-10-CM | POA: Diagnosis not present

## 2018-12-26 DIAGNOSIS — I509 Heart failure, unspecified: Secondary | ICD-10-CM | POA: Insufficient documentation

## 2018-12-26 DIAGNOSIS — I110 Hypertensive heart disease with heart failure: Secondary | ICD-10-CM | POA: Insufficient documentation

## 2018-12-26 DIAGNOSIS — Z853 Personal history of malignant neoplasm of breast: Secondary | ICD-10-CM | POA: Diagnosis not present

## 2018-12-26 HISTORY — DX: Personal history of other diseases of the digestive system: Z87.19

## 2018-12-26 HISTORY — DX: Other specified disorders of bone density and structure, unspecified site: M85.80

## 2018-12-26 HISTORY — DX: Raynaud's syndrome without gangrene: I73.00

## 2018-12-26 HISTORY — DX: Angina pectoris, unspecified: I20.9

## 2018-12-26 HISTORY — DX: Unspecified osteoarthritis, unspecified site: M19.90

## 2018-12-26 HISTORY — DX: Vitamin D deficiency, unspecified: E55.9

## 2018-12-26 LAB — URINALYSIS, ROUTINE W REFLEX MICROSCOPIC
Bilirubin Urine: NEGATIVE
Glucose, UA: NEGATIVE mg/dL
Ketones, ur: NEGATIVE mg/dL
Leukocytes,Ua: NEGATIVE
Nitrite: NEGATIVE
Protein, ur: NEGATIVE mg/dL
Specific Gravity, Urine: 1.009 (ref 1.005–1.030)
pH: 5 (ref 5.0–8.0)

## 2018-12-26 LAB — BLOOD GAS, ARTERIAL
Acid-Base Excess: 1.5 mmol/L (ref 0.0–2.0)
Bicarbonate: 25.2 mmol/L (ref 20.0–28.0)
Drawn by: 4705911
FIO2: 21
O2 Saturation: 94.3 %
Patient temperature: 98.6
pCO2 arterial: 37 mmHg (ref 32.0–48.0)
pH, Arterial: 7.447 (ref 7.350–7.450)
pO2, Arterial: 68.8 mmHg — ABNORMAL LOW (ref 83.0–108.0)

## 2018-12-26 LAB — COMPREHENSIVE METABOLIC PANEL
ALT: 15 U/L (ref 0–44)
AST: 24 U/L (ref 15–41)
Albumin: 3.6 g/dL (ref 3.5–5.0)
Alkaline Phosphatase: 56 U/L (ref 38–126)
Anion gap: 12 (ref 5–15)
BUN: 22 mg/dL (ref 8–23)
CO2: 20 mmol/L — ABNORMAL LOW (ref 22–32)
Calcium: 8.6 mg/dL — ABNORMAL LOW (ref 8.9–10.3)
Chloride: 108 mmol/L (ref 98–111)
Creatinine, Ser: 1.12 mg/dL — ABNORMAL HIGH (ref 0.44–1.00)
GFR calc Af Amer: 58 mL/min — ABNORMAL LOW (ref >60)
GFR calc non Af Amer: 50 mL/min — ABNORMAL LOW (ref >60)
Glucose, Bld: 104 mg/dL — ABNORMAL HIGH (ref 70–99)
Potassium: 3.7 mmol/L (ref 3.5–5.1)
Sodium: 140 mmol/L (ref 135–145)
Total Bilirubin: 0.2 mg/dL — ABNORMAL LOW (ref 0.3–1.2)
Total Protein: 6.9 g/dL (ref 6.5–8.1)

## 2018-12-26 LAB — TYPE AND SCREEN
ABO/RH(D): AB POS
Antibody Screen: NEGATIVE

## 2018-12-26 LAB — CBC
HCT: 30.5 % — ABNORMAL LOW (ref 36.0–46.0)
Hemoglobin: 10.1 g/dL — ABNORMAL LOW (ref 12.0–15.0)
MCH: 37.1 pg — ABNORMAL HIGH (ref 26.0–34.0)
MCHC: 33.1 g/dL (ref 30.0–36.0)
MCV: 112.1 fL — ABNORMAL HIGH (ref 80.0–100.0)
Platelets: 317 10*3/uL (ref 150–400)
RBC: 2.72 MIL/uL — ABNORMAL LOW (ref 3.87–5.11)
RDW: 14.7 % (ref 11.5–15.5)
WBC: 5.3 10*3/uL (ref 4.0–10.5)
nRBC: 0 % (ref 0.0–0.2)

## 2018-12-26 LAB — PROTIME-INR
INR: 1 (ref 0.8–1.2)
Prothrombin Time: 13.2 seconds (ref 11.4–15.2)

## 2018-12-26 LAB — SURGICAL PCR SCREEN
MRSA, PCR: NEGATIVE
Staphylococcus aureus: NEGATIVE

## 2018-12-26 LAB — APTT: aPTT: 31 seconds (ref 24–36)

## 2018-12-26 NOTE — Progress Notes (Signed)
[place], [place] [address] Phone: [phone] Fax: [phone]      Your procedure is scheduled on Tuesday, 12/30/2018.  Report to [facility] Main Entrance "A" at 05:30 A.M., and check in at the Admitting office.  Call this number if you have problems the morning of surgery:  [phone]  Call [phone] if you have any questions prior to your surgery date Monday-Friday 8am-4pm    Remember:  Do not eat or drink after midnight the night before your surgery    Take these medicines the morning of surgery with A SIP OF WATER: Albuterol (Proventil HFA;Ventolin HFA) Inhaler - if needed Amlodipine (Norvasc) Ipratropium-alburterol nebulizer - if needed Methocarbamol (Robaxin) - if needed Ondansetron (Zofran) - if needed Pantoprazole (Protonix)  Follow your surgeon's instructions on when to stop Aspirin.  If no instructions were given by your surgeon then you will need to call the office to get those instructions.     7 days prior to surgery STOP taking any Aspirin (unless otherwise instructed by your surgeon), Aleve, Naproxen, Ibuprofen, Motrin, Advil, Goody's, BC's, all herbal medications, fish oil, and all vitamins.    The Morning of Surgery  Do not wear jewelry, make-up or nail polish.  Do not wear lotions, powders, or perfumes/colognes, or deodorant  Do not shave 48 hours prior to surgery.    Do not bring valuables to the hospital.  [facility] is not responsible for any belongings or valuables.  IF you are a smoker, DO NOT Smoke 24 hours prior to surgery  IF you wear a CPAP at night please bring your mask, tubing, and machine the morning of surgery   Remember that you must have someone to transport you home after your surgery, and remain with you for 24 hours if you are discharged the same day.   Contacts, eyeglasses, hearing aids, dentures or bridgework may not be worn into surgery.    Leave your  suitcase in the car.  After surgery it may be brought to your room.  For patients admitted to the hospital, discharge time will be determined by your treatment team.  Patients discharged the day of surgery will not be allowed to drive home.    Special instructions:   [facility]- Preparing For Surgery  Before surgery, you can play an important role. Because skin is not sterile, your skin needs to be as free of germs as possible. You can reduce the number of germs on your skin by washing with CHG (chlorahexidine gluconate) Soap before surgery.  CHG is an antiseptic cleaner which kills germs and bonds with the skin to continue killing germs even after washing.    Oral Hygiene is also important to reduce your risk of infection.  Remember - BRUSH YOUR TEETH THE MORNING OF SURGERY WITH YOUR REGULAR TOOTHPASTE  Please do not use if you have an allergy to CHG or antibacterial soaps. If your skin becomes reddened/irritated stop using the CHG.  Do not shave (including legs and underarms) for at least 48 hours prior to first CHG shower. It is OK to shave your face.  Please follow these instructions carefully.   1. Shower the NIGHT BEFORE SURGERY and the MORNING OF SURGERY with CHG Soap.   2. If you chose to wash your hair, wash your hair first as usual with your normal shampoo.  3. After you shampoo, rinse your hair and body thoroughly to remove the shampoo.  4. Use CHG as you would any other liquid soap. You can apply CHG directly to the skin and wash gently with a scrungie or a clean washcloth.   5. Apply the CHG Soap to your body ONLY FROM THE NECK DOWN.  Do not use on open wounds or open sores. Avoid contact with your eyes, ears, mouth and genitals (private parts). Wash Face and genitals (private parts)  with your normal soap.   6. Wash thoroughly, paying special attention to the area where your surgery will be performed.  7. Thoroughly rinse your body with warm water from the neck  down.  8. DO NOT shower/wash with your normal soap after using and rinsing off the CHG Soap.  9. Pat yourself dry with a CLEAN TOWEL.  10. Wear CLEAN PAJAMAS to bed the night before surgery, wear comfortable clothes the morning of surgery  11. Place CLEAN SHEETS on your bed the night of your first shower and DO NOT SLEEP WITH PETS.    Day of Surgery:  Please shower the morning of surgery with the CHG soap Do not apply any deodorants/lotions.  Please wear clean clothes to the hospital/surgery center.   Remember to brush your teeth WITH YOUR REGULAR TOOTHPASTE.   Please read over the following fact sheets that you were given.

## 2018-12-26 NOTE — Progress Notes (Signed)
PCP: Hollice Gong NP--[facility] Cardiologist: Dr. Angela Burke-- notes in Care Everywhere  EKG: Today CXR: Today ECHO: 01/2017--C.E. Stress Test: 12/25/2018 Cardiac Cath: 01/2016   Patient denies shortness of breath, fever, cough, and chest pain at PAT appointment.  Patient verbalized understanding of instructions provided today at the PAT appointment.  Patient asked to review instructions at home and day of surgery.

## 2018-12-26 NOTE — Progress Notes (Signed)
Called Ryan at Dr. Abran Duke office, re: made aware of labs

## 2018-12-28 LAB — NOVEL CORONAVIRUS, NAA (HOSP ORDER, SEND-OUT TO REF LAB; TAT 18-24 HRS): SARS-CoV-2, NAA: NOT DETECTED

## 2018-12-29 NOTE — Anesthesia Preprocedure Evaluation (Addendum)
Anesthesia Evaluation  Patient identified by MRN, date of birth, ID band Patient awake    Reviewed: Allergy & Precautions, H&P , NPO status , Patient's Chart, lab work & pertinent test results  Airway Mallampati: II   Neck ROM: full    Dental   Pulmonary shortness of breath, COPD, Patient abstained from smoking., former smoker,    breath sounds clear to auscultation       Cardiovascular hypertension, + CAD   Rhythm:regular Rate:Normal  Cath (2017); mild non-obstructive CAD. Normal LV Stress (12/2018): non-ischemic. Normal LV. Low risk.   Neuro/Psych  Neuromuscular disease    GI/Hepatic hiatal hernia, GERD  ,  Endo/Other    Renal/GU      Musculoskeletal  (+) Arthritis ,   Abdominal   Peds  Hematology  (+) Blood dyscrasia, ,   Anesthesia Other Findings   Reproductive/Obstetrics                            Anesthesia Physical Anesthesia Plan  ASA: III  Anesthesia Plan: General   Post-op Pain Management:    Induction: Intravenous  PONV Risk Score and Plan: 3 and Ondansetron, Dexamethasone, Midazolam and Treatment may vary due to age or medical condition  Airway Management Planned: Double Lumen EBT and Video Laryngoscope Planned  Additional Equipment: Arterial line and CVP  Intra-op Plan:   Post-operative Plan: Extubation in OR  Informed Consent: I have reviewed the patients History and Physical, chart, labs and discussed the procedure including the risks, benefits and alternatives for the proposed anesthesia with the patient or authorized representative who has indicated his/her understanding and acceptance.       Plan Discussed with: CRNA, Anesthesiologist and Surgeon  Anesthesia Plan Comments: (PAT note written 12/29/2018 by Myra Gianotti, PA-C. )       Anesthesia Quick Evaluation

## 2018-12-29 NOTE — Progress Notes (Signed)
Anesthesia Chart Review:  Case: [id number] Date/Time: 12/30/18 0715   Procedures:      VIDEO BRONCHOSCOPY (N/A )     VIDEO ASSISTED THORACOSCOPY (VATS)/RIGHT UPPER LOBECTOMY (Right Chest)   Anesthesia type: General   Pre-op diagnosis: RUL PULMONARY NODULES   Location: [facility] / [facility]   Surgeon: Lajuana Matte, MD      DISCUSSION: Patient is a 68 year old female scheduled for the above procedure. 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 Biopsy complicated by PTX requiring CT. According to Dr. Abran Duke note, "we will assess the lymph nodes intraoperatively.  If positive, the lobectomy will be aborted.  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  She had a preoperative low risk stress test (ordered by Dr. Kipp Brood).   12/26/18 COVID-19 test negative. If no acute changes then I anticipate that she can proceed as planned.   VS: BP 124/72   Pulse 64   Temp 36.9 C   Resp 20   Ht '5\' 2"'  (1.575 m)   Wt 51.6 kg   SpO2 98%   BMI 20.81 kg/m     PROVIDERS: [name], NP is PCP ([facility]) - Derek Jack, MD is HEM-ONC - Sherre Lain, MD  is cardiologist ([place]). Last evaluation 08/29/18 for chest pain and Raynaud's phenomenon follow-up. He thought she likely had underlying small vessel disease and started her on Nitro patches.     LABS: Labs reviewed: Acceptable for surgery. (all labs ordered are listed, but only abnormal results are displayed)  Labs Reviewed  BLOOD GAS, ARTERIAL - Abnormal; Notable for the following components:      Result Value   pO2, Arterial 68.8 (*)    All other components within normal limits  CBC - Abnormal; Notable for the following components:   RBC 2.72 (*)    Hemoglobin 10.1 (*)    HCT 30.5 (*)    MCV 112.1 (*)    MCH 37.1 (*)    All other components within normal limits  COMPREHENSIVE METABOLIC PANEL - Abnormal; Notable for the following components:   CO2 20 (*)    Glucose, Bld 104 (*)    Creatinine, Ser 1.12 (*)    Calcium 8.6 (*)    Total Bilirubin 0.2 (*)    GFR calc non Af Amer 50 (*)    GFR calc Af Amer 58 (*)    All other components within normal limits  URINALYSIS, ROUTINE W REFLEX MICROSCOPIC - Abnormal; Notable for the following components:   Color, Urine STRAW (*)    Hgb urine dipstick SMALL (*)    Bacteria, UA RARE (*)    All other components within normal limits  SURGICAL PCR SCREEN  APTT  PROTIME-INR  TYPE AND SCREEN    PFTs 11/03/18: FVC 2.83 (107%), post 2.88 (109%). FEV1 1.64 (82%), post  1.71 (86%). DLCO unc 8.44 (48%).    IMAGES: CXR 12/26/18: FINDINGS: Normal cardiac and mediastinal contours. Subpleural masslike opacity demonstrated within the peripheral aspect of the right upper lobe. No pleural effusion or pneumothorax. Lumbar spinal fusion hardware. Thoracic spine degenerative changes. Cervical spinal fusion hardware. IMPRESSION: Masslike opacity within the peripheral right upper lobe compatible with biopsy proven carcinoma.  CTA chest 04/15/18: IMPRESSION: 1. Stable 4.4 cm thoracic ascending aortic aneurysm without complicating  features. Recommend annual imaging followup by CTA or MRA. This recommendation follows 2010 ACCF/AHA/AATS/ACR/ASA/SCA/SCAI/SIR/STS/SVM Guidelines for the Diagnosis and Management of Patients with Thoracic Aortic Disease. 2010; 121: Y650-P546. 2. Dilated central pulmonary arteries suggesting pulmonary arterial hypertension. 3. Enlarging 1 cm sub solid right upper lobe pulmonary nodule. Attention recommended on follow-up imaging. 4. Aortoiliac atheromatous plaque without aneurysm or stenosis.    EKG: 12/26/18: Sinus bradycardia at 52 bpm Otherwise normal ECG Confirmed by Skeet Latch [phone]) on 12/26/2018 3:09:47 PM   CV: Nuclear stress test 12/25/18:  There was no ST segment deviation noted during stress  The left ventricular ejection fraction is mildly decreased (45-54%).  Nuclear stress EF: 51%.  Defect 1: There is a fixed defect of mild severity present in the apex location. Artifact vs small infarct. Suspect artifact given normal wall motion  The study is normal.  This is a low risk study.   PET CT Myocardial Perfusion Study 06/11/18 [facility]): Impressions: - Probably normal myocardial perfusion study. - No evidence for significant ischemia or scar is noted. - There is moderate gut uptake noted. - During stress: Global systolic function is mildly reduced. The  ejection fraction calculated at 43%.  - Mild coronary calcifications are noted, as are moderate atherosclerotic  plaque throughout the ascending aorta, aortic arch and descending aorta. - Sensitivity and specificity of this test are reduced by the noted gut  activity. - Compared to SPECT myocardial perfusion study from 08/12/2016 there are no  significant changes in stress imaging and rest images are of better  technical quality. - Incidentally noted on attenutation CT are several bilateral,  sub-centimeter pulmonary nodules which were previously noted in 2018.  Additionally patient has status post left  mastectomy, axillary lymph node  dissection, and left breast reconstruction findings noted.   Echo 02/02/17 [facility]):  Technically difficult study due to chest wall/lung interference  Normal left ventricular systolic function, ejection fraction 55%  Aortic sclerosis  Normal right ventricular systolic function  Mildly elevated right atrial pressure   Cardiac cath 01/20/16:  Ost RCA to Prox RCA lesion, 30 %stenosed.  Prox Cx to Mid Cx lesion, 30 %stenosed.  Ost LM to LM lesion, 10 %stenosed.  Prox LAD to Mid LAD lesion, 10 %stenosed.  LV end diastolic pressure is normal. 1. Mild non-obstructive CAD 2. Normal LV filling pressures.  Recommendations: No further ischemic workup. Medical management of CAD. She is on an ARB. Consider addition of beta blocker. She will need further investigation of cause of her dyspnea with pulmonary workup.     Past Medical History:  Diagnosis Date  . Adenocarcinoma of left breast ([place]) 01/09/2016  . Anginal pain ([place])   . Arthritis   . Ascending aortic aneurysm ([place])   . Cancer [facility]) 1995   breast, left, mastectomy/chemo  . Chest pain    Possibly cardiac. No evidence of ischemia/injury based upon normal troponin I. Chest discomfort could be tachycardia induced supply demand mismatch.   . CHF (congestive heart failure) ([place]) 11/17/2015   after surgery   .  Colon adenomas   . Coronary artery disease   . DJD (degenerative joint disease)   . Dyspnea    with exertion  . Emphysema of lung ([place]) 11/17/2015  . Essential hypertension, benign   . GERD (gastroesophageal reflux disease)   . History of hiatal hernia   . Hyperlipidemia   . Hypertension   . Melanoma ([place]) 01/09/2016  . Osteopenia   . Palpitations   . Pernicious anemia 03/06/2016  . Pernicious anemia   . Pure hypercholesterolemia   . Raynaud's disease   . Thrombocythemia, essential ([place]) 01/09/2016  . Thrombocytosis ([facility])    Idiopathic  . Vitamin D deficiency     Past  Surgical History:  Procedure Laterality Date  . ABDOMINAL HYSTERECTOMY    . ANTERIOR AND POSTERIOR REPAIR N/A 12/09/2014   Procedure: ANTERIOR (CYSTOCELE) AND POSTERIOR REPAIR (RECTOCELE);  Surgeon: Bjorn Loser, MD;  Location: [facility];  Service: Urology;  Laterality: N/A;  . ANTERIOR CERVICAL DECOMPRESSION/DISCECTOMY FUSION 4 LEVELS Right 10/03/2016   Procedure: ANTERIOR CERVICAL DECOMPRESSION FUSION, CERVICAL 4-5, CERVICAL 5-6, CERVICAL 6-7, CERVICAL 7 TO THORACIC 1 WITH INSTRUMENTATION AND ALLOGRAFT;  Surgeon: Phylliss Bob, MD;  Location: [facility];  Service: Orthopedics;  Laterality: Right;  ANTERIOR CERVICAL DECOMPRESSION FUSION, CERVICAL 4-5, CERVICAL 5-6, CERVICAL 6-7, CERVICAL 7 TO THORACIC 1 WITH INSTRUMENTATION AND ALLOGRAFT; REQUEST 4 HO  . ANTERIOR LAT LUMBAR FUSION Left 11/16/2015   Procedure: LEFT SIDED LATERAL INTERBODY FUSION, LUMBAR 2-3, LUMBAR 3-4, LUMBAR 4-5 WITH INSTRUMENTATION;  Surgeon: Phylliss Bob, MD;  Location: [facility];  Service: Orthopedics;  Laterality: Left;  LEFT SIDED LATEARL INTERBODY FUSION, LUMBAR 2-3, LUMBAR 3-4, LUMBAR 4-5 WITH INSTRUMENTATION   . APPENDECTOMY    . BACK SURGERY    . BONE MARROW ASPIRATION  07/2012  . BONE MARROW BIOPSY  07/2012  . BREAST SURGERY    . CARDIAC CATHETERIZATION    . CARDIAC CATHETERIZATION N/A 01/20/2016   Procedure: Left Heart Cath and Coronary Angiography;  Surgeon: Burnell Blanks, MD;  Location: [facility];  Service: Cardiovascular;  Laterality: N/A;  . COLONOSCOPY  11/29/2010   Procedure: COLONOSCOPY;  Surgeon: Rogene Houston, MD;  [facility];  Service: Endoscopy;  Laterality: N/A;  . COLONOSCOPY N/A 02/18/2014   Procedure: COLONOSCOPY;  Surgeon: Rogene Houston, MD;  [facility];  Service: Endoscopy;  Laterality: N/A;  1030  . COLONOSCOPY N/A 02/25/2017   Procedure: COLONOSCOPY;  Surgeon: Rogene Houston, MD;  [facility];  Service: Endoscopy;  Laterality: N/A;  10:55  .  CYSTOSCOPY N/A 12/09/2014   Procedure: CYSTOSCOPY;  Surgeon: Bjorn Loser, MD;  Location: [facility];  Service: Urology;  Laterality: N/A;  . ESOPHAGEAL DILATION N/A 02/25/2017   Procedure: ESOPHAGEAL DILATION;  Surgeon: Rogene Houston, MD;  [facility];  Service: Endoscopy;  Laterality: N/A;  . ESOPHAGOGASTRODUODENOSCOPY N/A 02/25/2017   Procedure: ESOPHAGOGASTRODUODENOSCOPY (EGD);  Surgeon: Rogene Houston, MD;  [facility];  Service: Endoscopy;  Laterality: N/A;  . IR GENERIC HISTORICAL  01/11/2016   IR RADIOLOGY PERIPHERAL GUIDED IV START 01/11/2016 Saverio Danker, PA-C [facility]  . IR GENERIC HISTORICAL  01/11/2016   IR US GUIDE VASC ACCESS RIGHT 01/11/2016 Saverio Danker, PA-C [facility]  . MASTECTOMY     left  . OVARIAN CYST SURGERY     x2  . POLYPECTOMY  02/25/2017   Procedure: POLYPECTOMY;  Surgeon: Rogene Houston, MD;  [facility];  Service: Endoscopy;;  .  SALPINGOOPHORECTOMY Bilateral 12/09/2014   Procedure: SALPINGO OOPHORECTOMY;  Surgeon: Servando Salina, MD;  Location: [facility];  Service: Gynecology;  Laterality: Bilateral;  . TUBAL LIGATION    . VAGINAL HYSTERECTOMY N/A 12/09/2014   Procedure: HYSTERECTOMY VAGINAL;  Surgeon: Servando Salina, MD;  Location: [facility];  Service: Gynecology;  Laterality: N/A;    MEDICATIONS: . albuterol (PROVENTIL HFA;VENTOLIN HFA) 108 (90 Base) MCG/ACT inhaler  . amLODipine (NORVASC) 2.5 MG tablet  . aspirin EC 81 MG tablet  . Calcium Carb-Cholecalciferol (CALCIUM 600+D) 600-800 MG-UNIT TABS  . Cholecalciferol (VITAMIN D-3) 1000 units CAPS  . clonazePAM (KLONOPIN) 0.5 MG tablet  . cyanocobalamin (,VITAMIN B-12,) 1000 MCG/ML injection  . furosemide (LASIX) 40 MG tablet  . gabapentin (NEURONTIN) 300 MG capsule  . hydroxyurea (HYDREA) 500 MG capsule  . ibandronate (BONIVA) 150 MG tablet  . ibuprofen (ADVIL,MOTRIN) 800 MG tablet  . ipratropium-albuterol (DUONEB) 0.5-2.5 (3) MG/3ML SOLN  .  methocarbamol (ROBAXIN) 500 MG tablet  . nitroGLYCERIN (NITRODUR - DOSED IN MG/24 HR) 0.1 mg/hr patch  . ondansetron (ZOFRAN) 8 MG tablet  . pantoprazole (PROTONIX) 40 MG tablet  . polyethylene glycol (MIRALAX / GLYCOLAX) 17 g packet  . potassium chloride SA (K-DUR,KLOR-CON) 20 MEQ tablet  . rosuvastatin (CRESTOR) 40 MG tablet  . traMADol (ULTRAM) 50 MG tablet   No current facility-administered medications for this encounter.      Myra Gianotti, PA-C Surgical Short Stay/Anesthesiology [facility] Phone [phone] [facility] Phone [phone] 12/29/2018 11:18 AM

## 2018-12-30 ENCOUNTER — Encounter (HOSPITAL_COMMUNITY)
Admission: RE | Disposition: A | Discharge status: Home Health | Payer: Self-pay | Source: Home / Self Care | Attending: Thoracic Surgery (Cardiothoracic Vascular Surgery)

## 2018-12-30 ENCOUNTER — Inpatient Hospital Stay (HOSPITAL_COMMUNITY): Payer: Medicare Other

## 2018-12-30 ENCOUNTER — Other Ambulatory Visit (HOSPITAL_COMMUNITY): Payer: Medicare Other

## 2018-12-30 ENCOUNTER — Ambulatory Visit (HOSPITAL_COMMUNITY): Payer: Medicare Other | Admitting: Hematology

## 2018-12-30 ENCOUNTER — Other Ambulatory Visit: Payer: Self-pay

## 2018-12-30 ENCOUNTER — Inpatient Hospital Stay (HOSPITAL_COMMUNITY)
Admission: RE | Admit: 2018-12-30 | Discharge: 2019-01-07 | DRG: 164 | Disposition: A | Discharge status: Home Health | Payer: Medicare Other | Attending: Thoracic Surgery (Cardiothoracic Vascular Surgery) | Admitting: Thoracic Surgery (Cardiothoracic Vascular Surgery)

## 2018-12-30 ENCOUNTER — Encounter (HOSPITAL_COMMUNITY): Payer: Medicare Other

## 2018-12-30 ENCOUNTER — Encounter (HOSPITAL_COMMUNITY): Payer: Self-pay | Admitting: Anesthesiology

## 2018-12-30 DIAGNOSIS — E785 Hyperlipidemia, unspecified: Secondary | ICD-10-CM | POA: Diagnosis present

## 2018-12-30 DIAGNOSIS — J939 Pneumothorax, unspecified: Secondary | ICD-10-CM

## 2018-12-30 DIAGNOSIS — K219 Gastro-esophageal reflux disease without esophagitis: Secondary | ICD-10-CM | POA: Diagnosis present

## 2018-12-30 DIAGNOSIS — J439 Emphysema, unspecified: Secondary | ICD-10-CM | POA: Diagnosis present

## 2018-12-30 DIAGNOSIS — J95811 Postprocedural pneumothorax: Secondary | ICD-10-CM | POA: Diagnosis not present

## 2018-12-30 DIAGNOSIS — J95812 Postprocedural air leak: Secondary | ICD-10-CM | POA: Diagnosis not present

## 2018-12-30 DIAGNOSIS — N179 Acute kidney failure, unspecified: Secondary | ICD-10-CM | POA: Diagnosis present

## 2018-12-30 DIAGNOSIS — Z981 Arthrodesis status: Secondary | ICD-10-CM

## 2018-12-30 DIAGNOSIS — Z853 Personal history of malignant neoplasm of breast: Secondary | ICD-10-CM

## 2018-12-30 DIAGNOSIS — C946 Myelodysplastic disease, not classified: Secondary | ICD-10-CM | POA: Diagnosis present

## 2018-12-30 DIAGNOSIS — I25118 Atherosclerotic heart disease of native coronary artery with other forms of angina pectoris: Secondary | ICD-10-CM | POA: Diagnosis present

## 2018-12-30 DIAGNOSIS — I712 Thoracic aortic aneurysm, without rupture: Secondary | ICD-10-CM | POA: Diagnosis present

## 2018-12-30 DIAGNOSIS — E7800 Pure hypercholesterolemia, unspecified: Secondary | ICD-10-CM | POA: Diagnosis present

## 2018-12-30 DIAGNOSIS — F17210 Nicotine dependence, cigarettes, uncomplicated: Secondary | ICD-10-CM | POA: Diagnosis present

## 2018-12-30 DIAGNOSIS — Z9012 Acquired absence of left breast and nipple: Secondary | ICD-10-CM

## 2018-12-30 DIAGNOSIS — Z8249 Family history of ischemic heart disease and other diseases of the circulatory system: Secondary | ICD-10-CM

## 2018-12-30 DIAGNOSIS — D62 Acute posthemorrhagic anemia: Secondary | ICD-10-CM | POA: Diagnosis not present

## 2018-12-30 DIAGNOSIS — C3411 Malignant neoplasm of upper lobe, right bronchus or lung: Principal | ICD-10-CM | POA: Diagnosis present

## 2018-12-30 DIAGNOSIS — E162 Hypoglycemia, unspecified: Secondary | ICD-10-CM | POA: Diagnosis not present

## 2018-12-30 DIAGNOSIS — J189 Pneumonia, unspecified organism: Secondary | ICD-10-CM

## 2018-12-30 DIAGNOSIS — C50912 Malignant neoplasm of unspecified site of left female breast: Secondary | ICD-10-CM

## 2018-12-30 DIAGNOSIS — Z8679 Personal history of other diseases of the circulatory system: Secondary | ICD-10-CM

## 2018-12-30 DIAGNOSIS — I129 Hypertensive chronic kidney disease with stage 1 through stage 4 chronic kidney disease, or unspecified chronic kidney disease: Secondary | ICD-10-CM | POA: Diagnosis present

## 2018-12-30 DIAGNOSIS — Y838 Other surgical procedures as the cause of abnormal reaction of the patient, or of later complication, without mention of misadventure at the time of the procedure: Secondary | ICD-10-CM | POA: Diagnosis present

## 2018-12-30 DIAGNOSIS — R54 Age-related physical debility: Secondary | ICD-10-CM | POA: Diagnosis present

## 2018-12-30 DIAGNOSIS — Z880 Allergy status to penicillin: Secondary | ICD-10-CM | POA: Diagnosis not present

## 2018-12-30 DIAGNOSIS — R339 Retention of urine, unspecified: Secondary | ICD-10-CM | POA: Diagnosis not present

## 2018-12-30 DIAGNOSIS — R911 Solitary pulmonary nodule: Secondary | ICD-10-CM | POA: Diagnosis present

## 2018-12-30 DIAGNOSIS — D473 Essential (hemorrhagic) thrombocythemia: Secondary | ICD-10-CM | POA: Diagnosis present

## 2018-12-30 DIAGNOSIS — N183 Chronic kidney disease, stage 3 (moderate): Secondary | ICD-10-CM | POA: Diagnosis present

## 2018-12-30 DIAGNOSIS — I959 Hypotension, unspecified: Secondary | ICD-10-CM | POA: Diagnosis not present

## 2018-12-30 DIAGNOSIS — Z419 Encounter for procedure for purposes other than remedying health state, unspecified: Secondary | ICD-10-CM

## 2018-12-30 DIAGNOSIS — Z85820 Personal history of malignant melanoma of skin: Secondary | ICD-10-CM

## 2018-12-30 DIAGNOSIS — D510 Vitamin B12 deficiency anemia due to intrinsic factor deficiency: Secondary | ICD-10-CM | POA: Diagnosis present

## 2018-12-30 DIAGNOSIS — Z825 Family history of asthma and other chronic lower respiratory diseases: Secondary | ICD-10-CM | POA: Diagnosis not present

## 2018-12-30 DIAGNOSIS — Z9689 Presence of other specified functional implants: Secondary | ICD-10-CM

## 2018-12-30 HISTORY — PX: VIDEO ASSISTED THORACOSCOPY (VATS)/ LOBECTOMY: SHX6169

## 2018-12-30 HISTORY — PX: VIDEO BRONCHOSCOPY: SHX5072

## 2018-12-30 HISTORY — PX: LYMPH NODE DISSECTION: SHX5087

## 2018-12-30 LAB — GLUCOSE, CAPILLARY
Glucose-Capillary: 212 mg/dL — ABNORMAL HIGH (ref 70–99)
Glucose-Capillary: 213 mg/dL — ABNORMAL HIGH (ref 70–99)

## 2018-12-30 SURGERY — BRONCHOSCOPY, VIDEO-ASSISTED
Anesthesia: General | Site: Chest | Laterality: Right

## 2018-12-30 MED ORDER — SODIUM CHLORIDE (PF) 0.9 % IJ SOLN
INTRAMUSCULAR | Status: DC | PRN
  Administered 2018-12-30: 50 mL

## 2018-12-30 MED ORDER — INSULIN ASPART 100 UNIT/ML ~~LOC~~ SOLN
0.0000 [IU] | Freq: Four times a day (QID) | SUBCUTANEOUS | Status: DC
  Administered 2018-12-30 – 2018-12-31 (×2): 8 [IU] via SUBCUTANEOUS

## 2018-12-30 MED ORDER — FENTANYL CITRATE (PF) 100 MCG/2ML IJ SOLN
INTRAMUSCULAR | Status: DC | PRN
  Administered 2018-12-30 (×5): 50 ug via INTRAVENOUS

## 2018-12-30 MED ORDER — LIDOCAINE 2% (20 MG/ML) 5 ML SYRINGE
INTRAMUSCULAR | Status: AC
  Filled 2018-12-30: qty 5

## 2018-12-30 MED ORDER — SODIUM CHLORIDE 0.9 % IV SOLN
INTRAVENOUS | Status: DC
  Administered 2018-12-30 – 2019-01-05 (×2): via INTRAVENOUS

## 2018-12-30 MED ORDER — DEXAMETHASONE SODIUM PHOSPHATE 10 MG/ML IJ SOLN
INTRAMUSCULAR | Status: AC
  Filled 2018-12-30: qty 1

## 2018-12-30 MED ORDER — 0.9 % SODIUM CHLORIDE (POUR BTL) OPTIME
TOPICAL | Status: DC | PRN
  Administered 2018-12-30 (×2): 1000 mL

## 2018-12-30 MED ORDER — BUPIVACAINE LIPOSOME 1.3 % IJ SUSP
20.0000 mL | Freq: Once | INTRAMUSCULAR | Status: DC
  Filled 2018-12-30: qty 20

## 2018-12-30 MED ORDER — ONDANSETRON HCL 4 MG/2ML IJ SOLN
INTRAMUSCULAR | Status: AC
  Filled 2018-12-30: qty 2

## 2018-12-30 MED ORDER — MIDAZOLAM HCL 5 MG/5ML IJ SOLN
INTRAMUSCULAR | Status: DC | PRN
  Administered 2018-12-30: 1 mg via INTRAVENOUS

## 2018-12-30 MED ORDER — ACETAMINOPHEN 160 MG/5ML PO SOLN: 1000.0000 mg | Freq: Four times a day (QID) | ORAL | Status: AC

## 2018-12-30 MED ORDER — ONDANSETRON HCL 4 MG/2ML IJ SOLN
INTRAMUSCULAR | Status: DC | PRN
  Administered 2018-12-30: 4 mg via INTRAVENOUS

## 2018-12-30 MED ORDER — PROPOFOL 10 MG/ML IV BOLUS
INTRAVENOUS | Status: AC
  Filled 2018-12-30: qty 20

## 2018-12-30 MED ORDER — FUROSEMIDE 20 MG PO TABS: 20.0000 mg | ORAL_TABLET | Freq: Every evening | ORAL | Status: DC

## 2018-12-30 MED ORDER — IPRATROPIUM-ALBUTEROL 0.5-2.5 (3) MG/3ML IN SOLN: 3.0000 mL | Freq: Four times a day (QID) | RESPIRATORY_TRACT | Status: DC | PRN

## 2018-12-30 MED ORDER — EPHEDRINE SULFATE-NACL 50-0.9 MG/10ML-% IV SOSY
PREFILLED_SYRINGE | INTRAVENOUS | Status: DC | PRN
  Administered 2018-12-30 (×2): 5 mg via INTRAVENOUS
  Administered 2018-12-30: 10 mg via INTRAVENOUS

## 2018-12-30 MED ORDER — LIDOCAINE 2% (20 MG/ML) 5 ML SYRINGE
INTRAMUSCULAR | Status: DC | PRN
  Administered 2018-12-30: 50 mg via INTRAVENOUS

## 2018-12-30 MED ORDER — SODIUM CHLORIDE 0.9 % IV SOLN: INTRAVENOUS | Status: DC | PRN

## 2018-12-30 MED ORDER — LACTATED RINGERS IV SOLN
INTRAVENOUS | Status: DC
  Administered 2018-12-30: 07:00:00 via INTRAVENOUS

## 2018-12-30 MED ORDER — ENOXAPARIN SODIUM 40 MG/0.4ML ~~LOC~~ SOLN
40.0000 mg | SUBCUTANEOUS | Status: DC
  Administered 2018-12-31 – 2019-01-01 (×2): 40 mg via SUBCUTANEOUS
  Filled 2018-12-30 (×2): qty 0.4

## 2018-12-30 MED ORDER — DEXAMETHASONE SODIUM PHOSPHATE 10 MG/ML IJ SOLN
INTRAMUSCULAR | Status: DC | PRN
  Administered 2018-12-30: 10 mg via INTRAVENOUS

## 2018-12-30 MED ORDER — BISACODYL 5 MG PO TBEC
10.0000 mg | DELAYED_RELEASE_TABLET | Freq: Every day | ORAL | Status: DC
  Administered 2018-12-30 – 2019-01-06 (×6): 10 mg via ORAL
  Filled 2018-12-30 (×7): qty 2

## 2018-12-30 MED ORDER — FENTANYL CITRATE (PF) 250 MCG/5ML IJ SOLN
INTRAMUSCULAR | Status: AC
  Filled 2018-12-30: qty 5

## 2018-12-30 MED ORDER — AMLODIPINE BESYLATE 2.5 MG PO TABS
2.5000 mg | ORAL_TABLET | Freq: Every day | ORAL | Status: DC
  Administered 2018-12-31 – 2019-01-01 (×2): 2.5 mg via ORAL
  Filled 2018-12-30 (×2): qty 1

## 2018-12-30 MED ORDER — ONDANSETRON HCL 4 MG/2ML IJ SOLN
4.0000 mg | Freq: Four times a day (QID) | INTRAMUSCULAR | Status: DC | PRN
  Administered 2019-01-01: 21:00:00 4 mg via INTRAVENOUS
  Filled 2018-12-30: qty 2

## 2018-12-30 MED ORDER — BUPIVACAINE HCL (PF) 0.5 % IJ SOLN
INTRAMUSCULAR | Status: AC
  Filled 2018-12-30: qty 30

## 2018-12-30 MED ORDER — MIDAZOLAM HCL 2 MG/2ML IJ SOLN
INTRAMUSCULAR | Status: AC
  Filled 2018-12-30: qty 2

## 2018-12-30 MED ORDER — VANCOMYCIN HCL IN DEXTROSE 1-5 GM/200ML-% IV SOLN
1000.0000 mg | Freq: Once | INTRAVENOUS | Status: AC
  Administered 2018-12-30: 22:00:00 1000 mg via INTRAVENOUS
  Filled 2018-12-30: qty 200

## 2018-12-30 MED ORDER — SENNOSIDES-DOCUSATE SODIUM 8.6-50 MG PO TABS
1.0000 | ORAL_TABLET | Freq: Every day | ORAL | Status: DC
  Administered 2018-12-30 – 2019-01-06 (×8): 1 via ORAL
  Filled 2018-12-30 (×8): qty 1

## 2018-12-30 MED ORDER — ROCURONIUM BROMIDE 10 MG/ML (PF) SYRINGE
PREFILLED_SYRINGE | INTRAVENOUS | Status: AC
  Filled 2018-12-30: qty 10

## 2018-12-30 MED ORDER — PANTOPRAZOLE SODIUM 40 MG PO TBEC
40.0000 mg | DELAYED_RELEASE_TABLET | Freq: Every day | ORAL | Status: DC
  Administered 2018-12-30 – 2019-01-07 (×9): 40 mg via ORAL
  Filled 2018-12-30 (×9): qty 1

## 2018-12-30 MED ORDER — ARTIFICIAL TEARS OPHTHALMIC OINT
TOPICAL_OINTMENT | OPHTHALMIC | Status: AC
  Filled 2018-12-30: qty 3.5

## 2018-12-30 MED ORDER — ROSUVASTATIN CALCIUM 20 MG PO TABS
40.0000 mg | ORAL_TABLET | Freq: Every day | ORAL | Status: DC
  Administered 2018-12-30 – 2019-01-06 (×8): 40 mg via ORAL
  Filled 2018-12-30 (×8): qty 2

## 2018-12-30 MED ORDER — GABAPENTIN 300 MG PO CAPS
300.0000 mg | ORAL_CAPSULE | Freq: Every day | ORAL | Status: DC
  Administered 2018-12-30 – 2019-01-06 (×8): 300 mg via ORAL
  Filled 2018-12-30 (×8): qty 1

## 2018-12-30 MED ORDER — OXYCODONE HCL 5 MG PO TABS: 5.0000 mg | ORAL_TABLET | Freq: Once | ORAL | Status: DC | PRN

## 2018-12-30 MED ORDER — POTASSIUM CHLORIDE CRYS ER 20 MEQ PO TBCR
30.0000 meq | EXTENDED_RELEASE_TABLET | Freq: Every day | ORAL | Status: DC
  Administered 2018-12-31 – 2019-01-01 (×2): 30 meq via ORAL
  Filled 2018-12-30 (×2): qty 1

## 2018-12-30 MED ORDER — SUGAMMADEX SODIUM 200 MG/2ML IV SOLN
INTRAVENOUS | Status: DC | PRN
  Administered 2018-12-30: 200 mg via INTRAVENOUS

## 2018-12-30 MED ORDER — LEVALBUTEROL HCL 0.63 MG/3ML IN NEBU: 0.6300 mg | INHALATION_SOLUTION | Freq: Three times a day (TID) | RESPIRATORY_TRACT | Status: DC | PRN

## 2018-12-30 MED ORDER — FENTANYL CITRATE (PF) 100 MCG/2ML IJ SOLN
25.0000 ug | INTRAMUSCULAR | Status: DC | PRN
  Administered 2018-12-30 (×4): 25 ug via INTRAVENOUS

## 2018-12-30 MED ORDER — ALBUMIN HUMAN 5 % IV SOLN
INTRAVENOUS | Status: DC | PRN
  Administered 2018-12-30: 11:00:00 via INTRAVENOUS

## 2018-12-30 MED ORDER — ONDANSETRON HCL 4 MG/2ML IJ SOLN: 4.0000 mg | Freq: Four times a day (QID) | INTRAMUSCULAR | Status: DC | PRN

## 2018-12-30 MED ORDER — TRAMADOL HCL 50 MG PO TABS
50.0000 mg | ORAL_TABLET | Freq: Four times a day (QID) | ORAL | Status: DC | PRN
  Administered 2018-12-30 – 2019-01-02 (×4): 50 mg via ORAL
  Administered 2019-01-03 (×2): 100 mg via ORAL
  Filled 2018-12-30: qty 2
  Filled 2018-12-30 (×3): qty 1
  Filled 2018-12-30: qty 2
  Filled 2018-12-30: qty 1

## 2018-12-30 MED ORDER — OXYCODONE HCL 5 MG/5ML PO SOLN: 5.0000 mg | Freq: Once | ORAL | Status: DC | PRN

## 2018-12-30 MED ORDER — OXYCODONE HCL 5 MG PO TABS
5.0000 mg | ORAL_TABLET | ORAL | Status: DC | PRN
  Administered 2018-12-30: 10 mg via ORAL
  Administered 2018-12-31: 5 mg via ORAL
  Administered 2018-12-31 – 2019-01-05 (×6): 10 mg via ORAL
  Administered 2019-01-05 – 2019-01-06 (×3): 5 mg via ORAL
  Administered 2019-01-07: 10 mg via ORAL
  Filled 2018-12-30 (×3): qty 2
  Filled 2018-12-30 (×2): qty 1
  Filled 2018-12-30 (×4): qty 2
  Filled 2018-12-30 (×2): qty 1
  Filled 2018-12-30: qty 2

## 2018-12-30 MED ORDER — PROPOFOL 10 MG/ML IV BOLUS
INTRAVENOUS | Status: DC | PRN
  Administered 2018-12-30: 100 mg via INTRAVENOUS

## 2018-12-30 MED ORDER — FENTANYL CITRATE (PF) 100 MCG/2ML IJ SOLN
INTRAMUSCULAR | Status: AC
  Filled 2018-12-30: qty 2

## 2018-12-30 MED ORDER — ACETAMINOPHEN 500 MG PO TABS
1000.0000 mg | ORAL_TABLET | Freq: Four times a day (QID) | ORAL | Status: AC
  Administered 2018-12-30 – 2019-01-04 (×17): 1000 mg via ORAL
  Filled 2018-12-30 (×18): qty 2

## 2018-12-30 MED ORDER — ROCURONIUM BROMIDE 10 MG/ML (PF) SYRINGE
PREFILLED_SYRINGE | INTRAVENOUS | Status: DC | PRN
  Administered 2018-12-30: 20 mg via INTRAVENOUS
  Administered 2018-12-30: 40 mg via INTRAVENOUS
  Administered 2018-12-30 (×2): 20 mg via INTRAVENOUS

## 2018-12-30 MED ORDER — SODIUM CHLORIDE 0.9 % IV SOLN
INTRAVENOUS | Status: DC | PRN
  Administered 2018-12-30: 08:00:00 25 ug/min via INTRAVENOUS

## 2018-12-30 MED ORDER — BUPIVACAINE LIPOSOME 1.3 % IJ SUSP
INTRAMUSCULAR | Status: DC | PRN
  Administered 2018-12-30: 12:00:00 50 mL

## 2018-12-30 MED ORDER — PHENYLEPHRINE HCL (PRESSORS) 10 MG/ML IV SOLN: INTRAVENOUS | Status: DC | PRN

## 2018-12-30 MED ORDER — INFLUENZA VAC A&B SA ADJ QUAD 0.5 ML IM PRSY
0.5000 mL | PREFILLED_SYRINGE | INTRAMUSCULAR | Status: DC
  Filled 2018-12-30: qty 0.5

## 2018-12-30 MED ORDER — KETOROLAC TROMETHAMINE 15 MG/ML IJ SOLN
15.0000 mg | Freq: Four times a day (QID) | INTRAMUSCULAR | Status: DC
  Administered 2018-12-30 – 2018-12-31 (×3): 15 mg via INTRAVENOUS
  Filled 2018-12-30 (×3): qty 1

## 2018-12-30 MED ORDER — VANCOMYCIN HCL IN DEXTROSE 1-5 GM/200ML-% IV SOLN
1000.0000 mg | Freq: Two times a day (BID) | INTRAVENOUS | Status: DC
  Filled 2018-12-30: qty 200

## 2018-12-30 MED ORDER — VANCOMYCIN HCL IN DEXTROSE 1-5 GM/200ML-% IV SOLN
INTRAVENOUS | Status: AC
  Administered 2018-12-30: 07:00:00 1000 mg via INTRAVENOUS
  Filled 2018-12-30: qty 200

## 2018-12-30 MED ORDER — LACTATED RINGERS IV SOLN
INTRAVENOUS | Status: DC | PRN
  Administered 2018-12-30: 07:00:00 via INTRAVENOUS

## 2018-12-30 MED ORDER — FENTANYL CITRATE (PF) 100 MCG/2ML IJ SOLN: 25.0000 ug | INTRAMUSCULAR | Status: DC | PRN

## 2018-12-30 MED ORDER — CLONAZEPAM 0.5 MG PO TABS
0.5000 mg | ORAL_TABLET | Freq: Every day | ORAL | Status: DC
  Administered 2018-12-30 – 2019-01-05 (×7): 0.5 mg via ORAL
  Filled 2018-12-30 (×7): qty 1

## 2018-12-30 MED ORDER — VANCOMYCIN HCL IN DEXTROSE 1-5 GM/200ML-% IV SOLN
1000.0000 mg | INTRAVENOUS | Status: AC
  Administered 2018-12-30: 07:00:00 1000 mg via INTRAVENOUS

## 2018-12-30 MED ORDER — ASPIRIN EC 81 MG PO TBEC
81.0000 mg | DELAYED_RELEASE_TABLET | Freq: Every day | ORAL | Status: DC
  Administered 2018-12-31 – 2019-01-07 (×8): 81 mg via ORAL
  Filled 2018-12-30 (×8): qty 1

## 2018-12-30 MED ORDER — VANCOMYCIN HCL 1000 MG IV SOLR: INTRAVENOUS | Status: DC | PRN

## 2018-12-30 MED ORDER — FUROSEMIDE 40 MG PO TABS
40.0000 mg | ORAL_TABLET | Freq: Every day | ORAL | Status: DC
  Administered 2018-12-31 – 2019-01-01 (×2): 40 mg via ORAL
  Filled 2018-12-30 (×2): qty 1

## 2018-12-30 SURGICAL SUPPLY — 111 items
APPLIER CLIP 5 13 M/L LIGAMAX5 (MISCELLANEOUS) ×4
APPLIER CLIP ROT 10 11.4 M/L (STAPLE)
BLADE CLIPPER SURG (BLADE) ×4 IMPLANT
CANISTER SUCT 3000ML PPV (MISCELLANEOUS) ×4 IMPLANT
CATH ROBINSON RED A/P 18FR (CATHETERS) ×2 IMPLANT
CATH THORACIC 28FR (CATHETERS) IMPLANT
CATH THORACIC 28FR RT ANG (CATHETERS) IMPLANT
CATH THORACIC 36FR (CATHETERS) IMPLANT
CATH THORACIC 36FR RT ANG (CATHETERS) IMPLANT
CATH TROCAR 20FR (CATHETERS) IMPLANT
CLIP APPLIE 5 13 M/L LIGAMAX5 (MISCELLANEOUS) IMPLANT
CLIP APPLIE ROT 10 11.4 M/L (STAPLE) IMPLANT
CLIP VESOCCLUDE MED 6/CT (CLIP) ×6 IMPLANT
CONN ST 1/4X3/8  BEN (MISCELLANEOUS)
CONN ST 1/4X3/8 BEN (MISCELLANEOUS) IMPLANT
CONN Y 3/8X3/8X3/8  BEN (MISCELLANEOUS)
CONN Y 3/8X3/8X3/8 BEN (MISCELLANEOUS) IMPLANT
CONT SPEC 4OZ CLIKSEAL STRL BL (MISCELLANEOUS) ×20 IMPLANT
COVER SURGICAL LIGHT HANDLE (MISCELLANEOUS) IMPLANT
COVER WAND RF STERILE (DRAPES) ×4 IMPLANT
DEFOGGER SCOPE WARMER CLEARIFY (MISCELLANEOUS) ×4 IMPLANT
DERMABOND ADVANCED (GAUZE/BANDAGES/DRESSINGS) ×2
DERMABOND ADVANCED .7 DNX12 (GAUZE/BANDAGES/DRESSINGS) IMPLANT
DISSECTOR BLUNT TIP ENDO 5MM (MISCELLANEOUS) IMPLANT
DRAIN CHANNEL 28F RND 3/8 FF (WOUND CARE) ×2 IMPLANT
DRAIN CHANNEL 32F RND 10.7 FF (WOUND CARE) IMPLANT
DRAPE WARM FLUID 44X44 (DRAPES) ×4 IMPLANT
ELECT BLADE 6.5 EXT (BLADE) ×6 IMPLANT
ELECT REM PT RETURN 9FT ADLT (ELECTROSURGICAL) ×4
ELECTRODE REM PT RTRN 9FT ADLT (ELECTROSURGICAL) ×2 IMPLANT
GAUZE SPONGE 4X4 12PLY STRL (GAUZE/BANDAGES/DRESSINGS) ×4 IMPLANT
GLOVE BIO SURGEON STRL SZ 6.5 (GLOVE) ×1 IMPLANT
GLOVE BIO SURGEON STRL SZ7 (GLOVE) ×12 IMPLANT
GLOVE BIO SURGEON STRL SZ7.5 (GLOVE) ×4 IMPLANT
GLOVE BIO SURGEONS STRL SZ 6.5 (GLOVE) ×1
GLOVE BIOGEL PI IND STRL 7.5 (GLOVE) IMPLANT
GLOVE BIOGEL PI INDICATOR 7.5 (GLOVE) ×2
GOWN STRL REUS W/ TWL LRG LVL3 (GOWN DISPOSABLE) ×4 IMPLANT
GOWN STRL REUS W/ TWL XL LVL3 (GOWN DISPOSABLE) ×2 IMPLANT
GOWN STRL REUS W/TWL LRG LVL3 (GOWN DISPOSABLE) ×4
GOWN STRL REUS W/TWL XL LVL3 (GOWN DISPOSABLE) ×2
HANDLE STAPLE ENDO GIA SHORT (STAPLE) ×2
HEMOSTAT SURGICEL 2X14 (HEMOSTASIS) IMPLANT
KIT BASIN OR (CUSTOM PROCEDURE TRAY) ×4 IMPLANT
KIT SUCTION CATH 14FR (SUCTIONS) IMPLANT
KIT TURNOVER KIT B (KITS) ×4 IMPLANT
NDL SPNL 18GX3.5 QUINCKE PK (NEEDLE) IMPLANT
NEEDLE 22X1 1/2 (OR ONLY) (NEEDLE) ×4 IMPLANT
NEEDLE SPNL 18GX3.5 QUINCKE PK (NEEDLE) IMPLANT
NS IRRIG 1000ML POUR BTL (IV SOLUTION) ×12 IMPLANT
PACK CHEST (CUSTOM PROCEDURE TRAY) ×4 IMPLANT
PACK UNIVERSAL I (CUSTOM PROCEDURE TRAY) ×4 IMPLANT
PAD ARMBOARD 7.5X6 YLW CONV (MISCELLANEOUS) ×8 IMPLANT
POUCH ENDO CATCH II 15MM (MISCELLANEOUS) ×2 IMPLANT
POUCH SPECIMEN RETRIEVAL 10MM (ENDOMECHANICALS) IMPLANT
RELOAD EGIA 45 MED/THCK PURPLE (STAPLE) ×12 IMPLANT
RELOAD EGIA 60 MED/THCK PURPLE (STAPLE) ×4 IMPLANT
RELOAD EGIA TRIS TAN 45 CVD (STAPLE) ×16 IMPLANT
RELOAD STAPLE 45 PURP MED/THCK (STAPLE) IMPLANT
RELOAD STAPLE 45 TAN MED CVD (STAPLE) IMPLANT
RELOAD STAPLE 60 BLK XTHK ART (STAPLE) IMPLANT
RELOAD STAPLE 60 MED/THCK ART (STAPLE) IMPLANT
RELOAD TRI 2.0 60 XTHK VAS SUL (STAPLE) ×8 IMPLANT
RELOAD TRI 45 ART MED THCK PUR (STAPLE) ×12 IMPLANT
RELOAD TRI 60 ART MED THCK BLK (STAPLE) ×2 IMPLANT
RELOAD TRI 60 ART MED THCK PUR (STAPLE) ×8 IMPLANT
SCISSORS LAP 5X35 DISP (ENDOMECHANICALS) IMPLANT
SEALANT PROGEL (MISCELLANEOUS) IMPLANT
SEALANT SURG COSEAL 4ML (VASCULAR PRODUCTS) IMPLANT
SEALANT SURG COSEAL 8ML (VASCULAR PRODUCTS) IMPLANT
SEALER LIGASURE MARYLAND 30 (ELECTROSURGICAL) ×6 IMPLANT
SET IRRIG TUBING LAPAROSCOPIC (IRRIGATION / IRRIGATOR) IMPLANT
SOL ANTI FOG 6CC (MISCELLANEOUS) ×2 IMPLANT
SOLUTION ANTI FOG 6CC (MISCELLANEOUS) ×4
SPECIMEN JAR MEDIUM (MISCELLANEOUS) IMPLANT
SPONGE INTESTINAL PEANUT (DISPOSABLE) ×8 IMPLANT
SPONGE TONSIL TAPE 1 RFD (DISPOSABLE) ×4 IMPLANT
STAPLER ENDO GIA 12 SHRT THIN (STAPLE) ×2 IMPLANT
STAPLER ENDO GIA 12MM SHORT (STAPLE) ×6 IMPLANT
STOPCOCK 4 WAY LG BORE MALE ST (IV SETS) ×4 IMPLANT
SUT MNCRL AB 3-0 PS2 18 (SUTURE) IMPLANT
SUT MON AB 2-0 CT1 36 (SUTURE) IMPLANT
SUT PDS AB 1 CTX 36 (SUTURE) IMPLANT
SUT PROLENE 4 0 RB 1 (SUTURE)
SUT PROLENE 4-0 RB1 .5 CRCL 36 (SUTURE) IMPLANT
SUT SILK  1 MH (SUTURE) ×2
SUT SILK 1 MH (SUTURE) IMPLANT
SUT SILK 1 TIES 10X30 (SUTURE) ×4 IMPLANT
SUT SILK 2 0 SH (SUTURE) IMPLANT
SUT SILK 2 0SH CR/8 30 (SUTURE) IMPLANT
SUT VIC AB 1 CTX 36 (SUTURE)
SUT VIC AB 1 CTX36XBRD ANBCTR (SUTURE) IMPLANT
SUT VIC AB 2-0 CT1 27 (SUTURE) ×4
SUT VIC AB 2-0 CT1 TAPERPNT 27 (SUTURE) ×4 IMPLANT
SUT VIC AB 3-0 SH 27 (SUTURE) ×2
SUT VIC AB 3-0 SH 27X BRD (SUTURE) ×2 IMPLANT
SUT VICRYL 0 UR6 27IN ABS (SUTURE) ×4 IMPLANT
SUT VICRYL 2 TP 1 (SUTURE) IMPLANT
SYR 10ML LL (SYRINGE) ×6 IMPLANT
SYR 30ML LL (SYRINGE) ×4 IMPLANT
SYR 50ML LL SCALE MARK (SYRINGE) ×2 IMPLANT
SYSTEM SAHARA CHEST DRAIN ATS (WOUND CARE) ×4 IMPLANT
TAPE CLOTH 4X10 WHT NS (GAUZE/BANDAGES/DRESSINGS) ×4 IMPLANT
TAPE CLOTH SURG 4X10 WHT LF (GAUZE/BANDAGES/DRESSINGS) ×2 IMPLANT
TIP APPLICATOR SPRAY EXTEND 16 (VASCULAR PRODUCTS) IMPLANT
TOWEL GREEN STERILE (TOWEL DISPOSABLE) ×4 IMPLANT
TOWEL GREEN STERILE FF (TOWEL DISPOSABLE) ×4 IMPLANT
TRAY FOLEY MTR SLVR 16FR STAT (SET/KITS/TRAYS/PACK) ×4 IMPLANT
TROCAR XCEL BLADELESS 5X75MML (TROCAR) ×6 IMPLANT
TUBING EXTENTION W/L.L. (IV SETS) ×4 IMPLANT
WATER STERILE IRR 1000ML POUR (IV SOLUTION) ×4 IMPLANT

## 2018-12-30 NOTE — Anesthesia Procedure Notes (Signed)
Central Venous Catheter Insertion Performed by: Albertha Ghee, MD, anesthesiologist Start/End9/15/2020 7:02 AM, 12/30/2018 7:13 AM Patient location: Pre-op. Preanesthetic checklist: patient identified, IV checked, site marked, risks and benefits discussed, surgical consent, monitors and equipment checked, pre-op evaluation, timeout performed and anesthesia consent Position: Trendelenburg Lidocaine 1% used for infiltration and patient sedated Hand hygiene performed , maximum sterile barriers used  and Seldinger technique used Catheter size: 7 Fr Central line was placed.Double lumen Procedure performed using ultrasound guided technique. Ultrasound Notes:anatomy identified, needle tip was noted to be adjacent to the nerve/plexus identified, no ultrasound evidence of intravascular and/or intraneural injection and image(s) printed for medical record Attempts: 1 Following insertion, line sutured, dressing applied and Biopatch. Post procedure assessment: blood return through all ports, free fluid flow and no air  Patient tolerated the procedure well with no immediate complications.

## 2018-12-30 NOTE — Op Note (Signed)
[address]       [place] [address]             [phone]        12/30/2018  Patient:  Ashley [name] Reasons Pre-Op Dx: small cell lung cancer of the right upper lobe   Post-op Dx:  same Procedure: - Bronchoscopy - Right Video assisted thoracoscopy - right upper lobectomy - Mediastinal lymph node sampling - pleural biopsy - Intercostal nerve block  Surgeon and Role:      * Kerby Hockley, Lucile Crater, MD - Primary    * D. Zimmeran, PA-C - assisting   Anesthesia  general EBL:  172ml Blood Administration: noe Specimen:  RLL pulmonary nodule, pleural biopsy X 2.  Right upper lobe, Station 7, 11, and 12 lymph nodes  Drains: 47 F argyle chest tube in right chest Counts: correct   Indications: 35 female witha small cell cancer of the right upper lobe. T3N0M0 Stage IIB. Localized disease, marginal pulmonary function. I explained to her that prior to resection, we will assess the lymph nodes intraoperatively. If positive, the lobectomy will be aborted. Also given her marginal lung function, there is a good chance that she will require supplemental O2 following the resection. Findings: Bronchoscopy showed normal anatomy, and scant secretions. The right upper lobe was densely adherent to the lateral chest wall.  We performed a mediastinal lymph node sampling, and the results did not show malignancy.    Operative Technique: After the risks, benefits and alternatives were thoroughly discussed, the patient was brought to the operative theatre.  Anesthesia was induced, and the bronchoscope was passed through the endotracheal tube.  All segmental bronchi were visualized.  The endotracheal tube was then exchanged for a double lumen tube.  The patient was then placed in a left lateral decubitus position and was prepped and draped in normal sterile fashion.  An appropriate surgical pause was performed, and pre-operative antibiotics were dosed accordingly.  We began with 3cm  incision in the anterior axillary line at the 8th intercostal space.  The chest was entered, and we then placed a 1cm incision at the 10th intercostal space, and introduced our camera port.  The lung was directly visualized.  The upper lobe was stuck to the chest wall.  There was evidence of tumor invading the pleura.  The lung was mobilized off of the chest wall.  We then made a 4cm incision in the 2nd intercostal space, and entered the chest under direct visualization.  The lung was then retracted superiorly, and the inferior pulmonary ligament was divided.  We sampled lymph nodes at station 7.  The hilum was mobilized anteriorly and posteriorly.  We identified the upper lobe vein, and after careful isolation, it was divided with an endo- GIA stapler.  We next moved to the upper lobe arterial supply.  The artery was then divided with an endo-GIA stapler.  The bronchus to the upper was then isolated.  After a test clamp, with good ventilation of the upper lobe, the bronchus was then divided.  The fissure was completed, and the specimen was passed into an endocatch bag.  It was removed from the superior access site.  We then performed an extra-pleural dissection at the area where the lobe was attached.  The circumference was then marked with clips  The chest was irrigated, and an air leak test was performed.  An intercostal nerve block was performed under direct visualization.  A 84F chest with  then placed, and we watch the remaining lobes re-expand.  The skin and soft tissue were closed with absorbable suture    The patient tolerated the procedure without any immediate complications, and was transferred to the PACU in stable condition.  Ashley [name]

## 2018-12-30 NOTE — Progress Notes (Signed)
[address] [place] [address] [phone]  Ashley [name] [place] Medical Record #[id number] Date of Birth:[birth year]  Referring:Katragadda, Dirk Dress, MD Primary Care:Strader, Laurita Quint, NP Primary Cardiologist:No primary care provider on file.  Chief Complaint:No chief complaint on file.   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  Her case was discussed in tumor board, and the decision was made to proceed with surgical resection given that it is localized to the right upper lobe.     Per my last note Ashley [name] [id number] y.o.femaleis seen in the office today for evaluation of right upper lobe pulmonary nodules. She has a history of breast adenocarcinoma that was treated in 1995 with a mastectomy, and no radiation. She has a long history of dyspnea, and exertional chest pain. Currently she can only walk about 56mn prior to having chest pain. It is relieved with rest, but she has been given nitro patches by her cardiologist as well. She continues to smoke, and has a chronic cough. When she underwent spine surgery back in 2017, she developed respiratory failure. She also lost 50 lbs, and has been unable to regain that weight.   Smoking Hx: Last cigarette was today   Current Activity/ Functional Status:  Patientisindependent with mobility/ambulation, transfers, ADL's, IADL's.   Zubrod Score: At the time of surgery this patient's most appropriate activity status/level should be described as: _0 ??0 Normal activity, no symptoms _1 ??1 Restricted in physical strenuous activity but ambulatory, able to do out light work _2 ??2 Ambulatory and capable of self care, unable to do work activities, up and  about >50 % of waking hours  _3 ??3 Only limited self care, in bed greater than 50% of waking hours _4 ??4 Completely disabled, no self care, confined to bed or chair _5 ??5 Moribund       Past Medical History:  Diagnosis Date  . Adenocarcinoma of left breast ([facility] 01/09/2016  . Arthritis   . Ascending aortic aneurysm ([facility]   . Cancer ([facility] 1995   breast, left, mastectomy/chemo  . Chest pain    Possibly cardiac. No evidence of ischemia/injury based upon normal troponin I. Chest discomfort could be tachycardia induced supply demand mismatch.   . CHF (congestive heart failure) ([facility] 11/17/2015   after surgery   . Colon adenomas   . Coronary artery disease   . Dyspnea    with exertion  . Emphysema of lung ([place] 11/17/2015  . Essential hypertension, benign   . GERD (gastroesophageal reflux disease)   . Hyperlipidemia   . Hypertension   . Melanoma ([facility] 01/09/2016  . Palpitations   . Pernicious anemia 03/06/2016  . Pure hypercholesterolemia   . Thrombocythemia, essential ([facility] 01/09/2016  . Thrombocytosis ([facility]    Idiopathic         Past Surgical History:  Procedure Laterality Date  . ANTERIOR AND POSTERIOR REPAIR N/A 12/09/2014   Procedure: ANTERIOR (CYSTOCELE) AND POSTERIOR REPAIR (RECTOCELE); Surgeon: SBjorn Loser MD; [facility]; Service: Urology; Laterality: N/A;  . ANTERIOR CERVICAL DECOMPRESSION/DISCECTOMY FUSION 4 LEVELS Right 10/03/2016   Procedure: ANTERIOR CERVICAL DECOMPRESSION FUSION, CERVICAL 4-5, CERVICAL 5-6, CERVICAL 6-7, CERVICAL 7 TO THORACIC 1 WITH INSTRUMENTATION AND ALLOGRAFT; Surgeon: DPhylliss Bob MD; [facility] Service: Orthopedics; Laterality: Right; ANTERIOR CERVICAL DECOMPRESSION FUSION, CERVICAL 4-5, CERVICAL 5-6, CERVICAL 6-7, CERVICAL 7 TO THORACIC 1 WITH INSTRUMENTATION AND ALLOGRAFT;  REQUEST 4 HO  . ANTERIOR LAT LUMBAR FUSION Left  11/16/2015   Procedure: LEFT SIDED LATERAL INTERBODY FUSION, LUMBAR 2-3, LUMBAR 3-4, LUMBAR 4-5 WITH INSTRUMENTATION; Surgeon: Phylliss Bob, MD; Location: [place]; Service: Orthopedics; Laterality: Left; LEFT SIDED LATEARL INTERBODY FUSION, LUMBAR 2-3, LUMBAR 3-4, LUMBAR 4-5 WITH INSTRUMENTATION   . APPENDECTOMY    . BONE MARROW ASPIRATION  07/2012  . BONE MARROW BIOPSY  07/2012  . BREAST SURGERY    . CARDIAC CATHETERIZATION    . CARDIAC CATHETERIZATION N/A 01/20/2016   Procedure: Left Heart Cath and Coronary Angiography; Surgeon: Burnell Blanks, MD; [facility]; Service: Cardiovascular; Laterality: N/A;  . COLONOSCOPY  11/29/2010   Procedure: COLONOSCOPY; Surgeon: Rogene Houston, MD; [facility]; Service: Endoscopy; Laterality: N/A;  . COLONOSCOPY N/A 02/18/2014   Procedure: COLONOSCOPY; Surgeon: Rogene Houston, MD; [facility]; Service: Endoscopy; Laterality: N/A; 1030  . COLONOSCOPY N/A 02/25/2017   Procedure: COLONOSCOPY; Surgeon: Rogene Houston, MD; [facility]; Service: Endoscopy; Laterality: N/A; 10:55  . CYSTOSCOPY N/A 12/09/2014   Procedure: CYSTOSCOPY; Surgeon: Bjorn Loser, MD; Location: [facility]; Service: Urology; Laterality: N/A;  . ESOPHAGEAL DILATION N/A 02/25/2017   Procedure: ESOPHAGEAL DILATION; Surgeon: Rogene Houston, MD; [facility]; Service: Endoscopy; Laterality: N/A;  . ESOPHAGOGASTRODUODENOSCOPY N/A 02/25/2017   Procedure: ESOPHAGOGASTRODUODENOSCOPY (EGD); Surgeon: Rogene Houston, MD; [facility]; Service: Endoscopy; Laterality: N/A;  . IR GENERIC HISTORICAL  01/11/2016   IR RADIOLOGY PERIPHERAL GUIDED IV START 01/11/2016 Saverio Danker, PA-C [facility]  . IR GENERIC HISTORICAL  01/11/2016   IR US GUIDE VASC ACCESS RIGHT 01/11/2016 Saverio Danker, PA-C [facility]  . MASTECTOMY     left  . OVARIAN CYST SURGERY     x2   . POLYPECTOMY  02/25/2017   Procedure: POLYPECTOMY; Surgeon: Rogene Houston, MD; [facility]; Service: Endoscopy;;  . SALPINGOOPHORECTOMY Bilateral 12/09/2014   Procedure: SALPINGO OOPHORECTOMY; Surgeon: Servando Salina, MD; Location: [facility]; Service: Gynecology; Laterality: Bilateral;  . TUBAL LIGATION    . VAGINAL HYSTERECTOMY N/A 12/09/2014   Procedure: HYSTERECTOMY VAGINAL; Surgeon: Servando Salina, MD; Location: [facility]; Service: Gynecology; Laterality: N/A;         Family History  Problem Relation Age of Onset  . Hypertension Mother   . Heart failure Mother   . Congestive Heart Failure Mother   . COPD Mother   . Pernicious anemia Mother   . Cancer Mother    lung  . Hypertension Father   . CAD Father   . Heart attack Father   . Hypertension Sister   . Cancer Other   . Celiac disease Other      Social History       Tobacco Use  Smoking Status Current Every Day Smoker  . Packs/day: 0.50  . Years: 50.00  . Pack years: 25.00  . Types: Cigarettes  Smokeless Tobacco Never Used   Social History      Substance and Sexual Activity  Alcohol Use No          Allergies  Allergen Reactions  . Penicillins Hives and Rash    Has patient had a PCN reaction causing immediate rash, facial/tongue/throat swelling, SOB or lightheadedness with hypotension: Yes Has patient had a PCN reaction causing severe rash involving mucus membranes or skin necrosis: Yes Has patient had a PCN reaction that required hospitalization No Has patient had a PCN reaction occurring within the last 10 years:  Yes If all of the above answers are "NO", then may proceed with Cephalosporin use.   . Tape Rash    Medipore, Coban, and paper tape CAN be tolerated          Current Outpatient Medications  Medication Sig Dispense Refill  . albuterol (PROVENTIL HFA;VENTOLIN HFA) 108 (90 Base) MCG/ACT inhaler Inhale 2 puffs  into the lungs every 6 (six) hours as needed for wheezing or shortness of breath. 1 Inhaler 2  . amLODipine (NORVASC) 2.5 MG tablet Take 2.5 mg by mouth daily.     Marland Kitchen aspirin EC 81 MG tablet Take 81 mg by mouth daily.    . Calcium Carb-Cholecalciferol (CALCIUM 600+D) 600-800 MG-UNIT TABS Take 1 tablet by mouth 2 (two) times daily.    . Cholecalciferol (VITAMIN D-3) 1000 units CAPS Take 1,000 Units daily by mouth.     . clonazePAM (KLONOPIN) 0.5 MG tablet Take 1 tablet (0.5 mg total) by mouth at bedtime. 90 tablet 2  . cyanocobalamin (,VITAMIN B-12,) 1000 MCG/ML injection INJECT 1 ML INTO THE MUSCLE ONCE MONTHLY AS DIRECTED. 1 mL 5  . furosemide (LASIX) 40 MG tablet Take 40 mg by mouth daily. Can go up to 80 mg daily per pt    . gabapentin (NEURONTIN) 300 MG capsule Take 300 mg by mouth at bedtime.    . hydroxyurea (HYDREA) 500 MG capsule TAKE 1 CAPSULE DAILY TUESDAY-FRIDAY AND 2 CAPSULES ON SATURDAY-MONDAY. 120 capsule 4  . ibandronate (BONIVA) 150 MG tablet Take 1 tablet by mouth every 30 (thirty) days.    Marland Kitchen ibuprofen (ADVIL,MOTRIN) 800 MG tablet Take 800 mg every 8 (eight) hours as needed by mouth for moderate pain.    Marland Kitchen ipratropium-albuterol (DUONEB) 0.5-2.5 (3) MG/3ML SOLN Take 3 mLs by nebulization every 6 (six) hours as needed (shortness of breath). 360 mL 0  . methocarbamol (ROBAXIN) 500 MG tablet Take 500 mg daily as needed by mouth for muscle spasms.     . nitroGLYCERIN (NITRODUR - DOSED IN MG/24 HR) 0.1 mg/hr patch Place 0.1 mg onto the skin daily.     . ondansetron (ZOFRAN) 8 MG tablet Take 1 tablet (8 mg total) by mouth every 8 (eight) hours as needed for nausea or vomiting. 20 tablet 5  . pantoprazole (PROTONIX) 40 MG tablet Take 40 mg daily by mouth.    . potassium chloride SA (K-DUR,KLOR-CON) 20 MEQ tablet Take 1 tablet (20 mEq total) by mouth 3 (three) times daily. 90 tablet 5  . rosuvastatin (CRESTOR) 40 MG tablet Take 40 mg at bedtime by mouth.     .  traMADol (ULTRAM) 50 MG tablet Take 50 mg by mouth as needed.      No current facility-administered medications for this visit.     Review of Systems: Constitutional:weight loss Cardiovascular:exertional chest pain, lower extremity swelling Pulmonary:dyspnea, cough RW:[id number] PJK:DTOI pain Neuro:no headaches     PHYSICAL EXAMINATION:    Today's Vitals   12/12/18 1129  BP: 120/77  Pulse: 65  Resp: 20  Temp: (!) 97.5 F (36.4 C)  TempSrc: Skin  SpO2: 95%  Weight: 111 lb (50.3 kg)  Height: _0  (1.575 m)   Body mass index is 20.3 kg/m.  General: non-toxic, appearsolderstated age. Frail HEENT: NCAT. Moist mucous membranes, Oropharynx clear. Nocervical or supraclavicular adenopathy. Neuro: Alert, Oriented X 3. Non focal . Psych: appropriate mood. Cardiovascular:RRR, no murmur Pulmonary:Clear B. No crackles ZT:IWPY, ND MSK: ambulateswell.   Diagnostic Studies & Laboratory data:  Recent Radiology Findings:  Imaging Results  Ct Chest Wo Contrast  Result Date: 10/14/2018 CLINICAL DATA: LEFT upper lobe pulmonary nodule. History of breast cancer EXAM: CT CHEST WITHOUT CONTRAST TECHNIQUE: Multidetector CT imaging of the chest was performed following the standard protocol without IV contrast. COMPARISON: CT 04/15/2017 PET-CT 07/29/2017 FINDINGS: Cardiovascular: Coronary artery calcification and aortic atherosclerotic calcification. Ascending aorta measures 42 mm similar to comparison exam Mediastinum/Nodes: No axillary or supraclavicular lymphadenopathy. No mediastinal or hilar adenopathy. Lungs/Pleura: Spiculated nodule in the RIGHT upper lobe measures 12 mm x 9 mm (image 63/4) compared to 10 mm x 5 mm. Lesion is increased in density. More inferiorly lobular pleural base nodule measuring 17 mm by 7 mm (image 72/4) is new from prior. Additional RIGHT upper lobe peripheral nodule measures 8 mm on image 60/4 is new from prior. In  LEFT upper lobe 3 mm nodule and 3 mm nodule image 28/4 new from prior. Benign appearing nodule along the RIGHT oblique fissure measures 7 mm unchanged. Upper Abdomen: Limited view of the liver, kidneys, pancreas are unremarkable. Normal adrenal glands. Musculoskeletal: Posterior lumbar fusion IMPRESSION: 1. Enlarging RIGHT upper lobe spiculated pulmonary nodule and new adjacent pleural base nodule. Recommend FDG PET scan to determine malignant potential. 2. Several additional peripheral nodules in LEFT and RIGHT lung are new from prior. Recommend attention on follow-up. 3. Stable aneurysmal dilatation of ascending aorta. Electronically Signed By: Suzy Bouchard M.D. On: 10/14/2018 12:37   Nm Pet Image Restag (ps) Skull Base To Thigh  Result Date: 10/24/2018 CLINICAL DATA: Subsequent treatment strategy for new right upper lobe pulmonary nodules. Remote history of breast cancer. EXAM: NUCLEAR MEDICINE PET SKULL BASE TO THIGH TECHNIQUE: 5.5 mCi F-18 FDG was injected intravenously. Full-ring PET imaging was performed from the skull base to thigh after the radiotracer. CT data was obtained and used for attenuation correction and anatomic localization. Fasting blood glucose: 137 mg/dl COMPARISON: Chest CT 10/14/2018. CTA of the chest, abdomen and pelvis 04/15/2018. FINDINGS: Mediastinal blood pool activity: SUV max 2.2 Liver activity: SUV max NA NECK: No areas of abnormal hypermetabolism. Incidental CT findings: No cervical adenopathy. Left carotid atherosclerosis. CHEST: Hypermetabolism corresponding to the right upper lobe spiculated pulmonary nodule. This measures a 1.0 cm and a S.U.V. max of 8.0 on image 33/8. Just inferior and lateral to this, a pleural-based nodule measures 1.8 cm and a S.U.V. max of 10.0 on 37/8. No thoracic nodal hypermetabolism. Incidental CT findings: Deferred to recent diagnostic CT. Advanced bullous type emphysema. Other pulmonary nodules detailed on the prior exam are below PET  resolution. Ascending aortic dilatation has been detailed previously. Tortuous thoracic aorta. Cardiomegaly. Aortic and coronary artery atherosclerosis. No axillary adenopathy. ABDOMEN/PELVIS: No abdominopelvic parenchymal or nodal hypermetabolism. Incidental CT findings: Right adrenal enlargement is likely related to hyperplasia. Mild chronic left adrenal nodularity. Abdominal aortic atherosclerosis. Low-density bilateral renal lesions are likely cysts. There also subcentimeter hyperattenuating right renal lesions which are indeterminate. Hyperattenuation within the left renal collecting system measures 1.7 cm on image 119/4. This may have been present on prior exams including back to 01/21/2017. Large colonic stool burden. Pelvic floor laxity. Hysterectomy. Pancreatic parenchymal calcifications likely represent chronic calcific pancreatitis. SKELETON: Hypermetabolism about the left facets at approximately the C3-4 level is just cephalad to surgical changes and favored to be degenerative. Incidental CT findings: Cervical and lumbar spine fixation. IMPRESSION: 1. Hypermetabolic right upper lobe pulmonary nodules which are suspicious for synchronous primary bronchogenic carcinomas. Metastatic disease felt less likely. 2. No evidence of thoracic nodal hypermetabolism or typical findings  of distant metastasis. 3. Aortic atherosclerosis (ICD10-I70.0), coronary artery atherosclerosis and emphysema (ICD10-J43.9). 4. Ascending aortic aneurysm, as detailed on prior exams. 5. Hyperattenuation in the interpolar left renal collecting system. This is felt to have been present over prior exams, favoring a benign etiology (i.e. The sequelae of prior infection). Consider correlation with urinalysis. Recommend attention on follow-up. Electronically Signed By: Abigail Miyamoto M.D. On: 10/24/2018 16:59    MRI brain 11/27/18 - No evidence of intracranial metastatic disease.  - A few small scattered signal changes within the  cerebral white matter are nonspecific, but consistent with chronic small vessel ischemic disease.  - Apparent signal abnormality within the partially imaged C3 vertebra, although this may be related to susceptibility artifact from spinal fusion hardware immediately beneath this level.   I have independently reviewed the above radiology studies and reviewed the findings with the patient.   Recent Lab Findings: Recent Labs       Lab Results  Component Value Date   WBC 6.5 10/07/2018   HGB 11.3 (L) 10/07/2018   HCT 34.3 (L) 10/07/2018   PLT 390 10/07/2018   GLUCOSE 99 10/07/2018   ALT 15 10/07/2018   AST 24 10/07/2018   NA 137 10/07/2018   K 3.9 10/07/2018   CL 100 10/07/2018   CREATININE 1.90 (H) 10/07/2018   BUN 25 (H) 10/07/2018   CO2 26 10/07/2018   INR 0.99 10/01/2017       PFTs:11/03/18 - FVC:107% - FEV1:82% -DLCO:48%    Problem List:  -CT chest 10/14/18 PET/CT 10/24/18 -  1.2cm right upper lobe spiculated nodule. Increased in sized. SUV 8 1.7cm right upper lobe pleural based nodule. 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  Localized disease, marginal pulmonary function.  I explained to her that prior to resection, we will assess the lymph nodes intraoperatively.  If positive, the lobectomy will be aborted.  Also given her marginal lung function, there is a good chance that she will require supplemental O2 following the resection.  She is tentatively scheduled for a bronchoscopy, Right VATS, mediastinal lymph node sampling, and right upper lobectomy     Ashley [name] O  Kadin Bera

## 2018-12-30 NOTE — Brief Op Note (Signed)
12/30/2018  11:40 AM  PATIENT:  Ashley [name]  68 y.o. female  PRE-OPERATIVE DIAGNOSIS:  1. Small cell cancer RUL 2.RUL pulmonary nodules  POST-OPERATIVE DIAGNOSIS:  1. Small cell cancer RUL 2.RUL pulmonary nodules  PROCEDURE: VIDEO BRONCHOSCOPY, RIGHT VIDEO ASSISTED THORACOSCOPY (VATS),RIGHT UPPER LOBECTOMY,LYMPH NODE DISSECTION, INTERCOSTAL NERVE BLOCK  SURGEON:  Surgeon(s) and Role:    Lightfoot, Lucile Crater, MD - Primary  PHYSICIAN ASSISTANT: Lars Pinks PA-C  ANESTHESIA:   general  EBL:  200 mL   BLOOD ADMINISTERED:none  DRAINS: 28 Chest tube placed in the right pleural space   LOCAL MEDICATIONS USED:  OTHER Exparel  SPECIMEN:  Source of Specimen:  Right pleural biopsy, LN biopsy, RUL, multiple lymph nodes,Right chest wall biopsy  DISPOSITION OF SPECIMEN:  Pathology. Frozen of right pleural biopsy and one lymph node negative for cancer  COUNTS CORRECT:  YES  DICTATION: .Dragon Dictation  PLAN OF CARE: Admit to inpatient   PATIENT DISPOSITION:  PACU - hemodynamically stable.   Delay start of Pharmacological VTE agent (>24hrs) due to surgical blood loss or risk of bleeding: yes

## 2018-12-30 NOTE — Transfer of Care (Signed)
Immediate Anesthesia Transfer of Care Note  Patient: Ashley [name]  Procedure(s) Performed: VIDEO BRONCHOSCOPY (N/A ) VIDEO ASSISTED THORACOSCOPY (VATS)/RIGHT LOWER LOBE WEDGE RESECTION, RIGHT UPPER LOBECTOMY (Right Chest) Lymph Node Dissection (Right Chest)  Patient Location: PACU  Anesthesia Type:General  Level of Consciousness: drowsy  Airway & Oxygen Therapy: Patient Spontanous Breathing and Patient connected to face mask oxygen  Post-op Assessment: Report given to RN, Post -op Vital signs reviewed and stable and Patient moving all extremities  Post vital signs: Reviewed and stable  Last Vitals:  Vitals Value Taken Time  BP 113/51 12/30/18 1220  Temp    Pulse 68 12/30/18 1227  Resp 13 12/30/18 1227  SpO2 100 % 12/30/18 1227  Vitals shown include unvalidated device data.  Last Pain:  Vitals:   12/30/18 0649  TempSrc: Oral         Complications: No apparent anesthesia complications

## 2018-12-30 NOTE — Anesthesia Procedure Notes (Signed)
Arterial Line Insertion Start/End9/15/2020 7:10 AM, 12/30/2018 7:15 AM Performed by: Verdie Drown, CRNA, CRNA  Patient location: Pre-op. Preanesthetic checklist: patient identified, IV checked, risks and benefits discussed, surgical consent, monitors and equipment checked, pre-op evaluation and timeout performed Lidocaine 1% used for infiltration and patient sedated Left, radial was placed Catheter size: 20 G Hand hygiene performed , maximum sterile barriers used  and Seldinger technique used Allen's test indicative of satisfactory collateral circulation Attempts: 1 Procedure performed without using ultrasound guided technique. Following insertion, Biopatch and dressing applied. Post procedure assessment: normal  Patient tolerated the procedure well with no immediate complications.

## 2018-12-30 NOTE — Anesthesia Procedure Notes (Signed)
Procedure Name: Intubation Date/Time: 12/30/2018 7:40 AM Performed by: Kyung Rudd, CRNA Pre-anesthesia Checklist: Patient identified, Emergency Drugs available, Suction available, Patient being monitored and Timeout performed Patient Re-evaluated:Patient Re-evaluated prior to induction Oxygen Delivery Method: Circle system utilized Preoxygenation: Pre-oxygenation with 100% oxygen Induction Type: IV induction Ventilation: Mask ventilation without difficulty Laryngoscope Size: Glidescope and 4 Endobronchial tube: Left, Double lumen EBT, EBT position confirmed by auscultation and EBT position confirmed by fiberoptic bronchoscope and 39 Fr Number of attempts: 1 Airway Equipment and Method: Stylet and Video-laryngoscopy Placement Confirmation: ETT inserted through vocal cords under direct vision,  positive ETCO2 and breath sounds checked- equal and bilateral Tube secured with: Tape Dental Injury: Teeth and Oropharynx as per pre-operative assessment

## 2018-12-31 ENCOUNTER — Inpatient Hospital Stay (HOSPITAL_COMMUNITY): Payer: Medicare Other

## 2018-12-31 ENCOUNTER — Encounter (HOSPITAL_COMMUNITY): Payer: Self-pay | Admitting: Thoracic Surgery (Cardiothoracic Vascular Surgery)

## 2018-12-31 LAB — BASIC METABOLIC PANEL
Anion gap: 8 (ref 5–15)
BUN: 18 mg/dL (ref 8–23)
CO2: 23 mmol/L (ref 22–32)
Calcium: 8.5 mg/dL — ABNORMAL LOW (ref 8.9–10.3)
Chloride: 106 mmol/L (ref 98–111)
Creatinine, Ser: 1.08 mg/dL — ABNORMAL HIGH (ref 0.44–1.00)
GFR calc Af Amer: >60 mL/min (ref >60)
GFR calc non Af Amer: 52 mL/min — ABNORMAL LOW (ref >60)
Glucose, Bld: 72 mg/dL (ref 70–99)
Potassium: 4 mmol/L (ref 3.5–5.1)
Sodium: 137 mmol/L (ref 135–145)

## 2018-12-31 LAB — CBC
HCT: 26.1 % — ABNORMAL LOW (ref 36.0–46.0)
Hemoglobin: 8.4 g/dL — ABNORMAL LOW (ref 12.0–15.0)
MCH: 36.4 pg — ABNORMAL HIGH (ref 26.0–34.0)
MCHC: 32.2 g/dL (ref 30.0–36.0)
MCV: 113 fL — ABNORMAL HIGH (ref 80.0–100.0)
Platelets: 306 10*3/uL (ref 150–400)
RBC: 2.31 MIL/uL — ABNORMAL LOW (ref 3.87–5.11)
RDW: 15 % (ref 11.5–15.5)
WBC: 10.5 10*3/uL (ref 4.0–10.5)
nRBC: 0 % (ref 0.0–0.2)

## 2018-12-31 LAB — GLUCOSE, CAPILLARY
Glucose-Capillary: 116 mg/dL — ABNORMAL HIGH (ref 70–99)
Glucose-Capillary: 66 mg/dL — ABNORMAL LOW (ref 70–99)

## 2018-12-31 MED ORDER — KETOROLAC TROMETHAMINE 15 MG/ML IJ SOLN
15.0000 mg | Freq: Four times a day (QID) | INTRAMUSCULAR | Status: AC
  Administered 2018-12-31 – 2019-01-01 (×4): 15 mg via INTRAVENOUS
  Filled 2018-12-31 (×4): qty 1

## 2018-12-31 MED ORDER — FUROSEMIDE 20 MG PO TABS
20.0000 mg | ORAL_TABLET | Freq: Every evening | ORAL | Status: DC
  Administered 2019-01-01: 20 mg via ORAL
  Filled 2018-12-31: qty 1

## 2018-12-31 MED ORDER — METHOCARBAMOL 500 MG PO TABS
500.0000 mg | ORAL_TABLET | Freq: Four times a day (QID) | ORAL | Status: DC | PRN
  Administered 2018-12-31 – 2019-01-05 (×3): 500 mg via ORAL
  Filled 2018-12-31 (×3): qty 1

## 2018-12-31 MED ORDER — POTASSIUM CHLORIDE CRYS ER 20 MEQ PO TBCR
20.0000 meq | EXTENDED_RELEASE_TABLET | Freq: Once | ORAL | Status: AC
  Administered 2018-12-31: 20 meq via ORAL
  Filled 2018-12-31: qty 1

## 2018-12-31 NOTE — Anesthesia Postprocedure Evaluation (Signed)
Anesthesia Post Note  Patient: Ashley [name]  Procedure(s) Performed: VIDEO BRONCHOSCOPY (N/A ) VIDEO ASSISTED THORACOSCOPY (VATS)/RIGHT LOWER LOBE WEDGE RESECTION, RIGHT UPPER LOBECTOMY (Right Chest) Lymph Node Dissection (Right Chest)     Patient location during evaluation: PACU Anesthesia Type: General Level of consciousness: awake and alert Pain management: pain level controlled Vital Signs Assessment: post-procedure vital signs reviewed and stable Respiratory status: spontaneous breathing, nonlabored ventilation, respiratory function stable and patient connected to nasal cannula oxygen Cardiovascular status: blood pressure returned to baseline and stable Postop Assessment: no apparent nausea or vomiting Anesthetic complications: no    Last Vitals:  Vitals:   12/31/18 1610 12/31/18 1701  BP:  (!) 115/57  Pulse:  69  Resp: (!) 24 14  Temp:  36.7 C  SpO2:  97%    Last Pain:  Vitals:   12/31/18 1701  TempSrc: Oral  PainSc: [place]

## 2018-12-31 NOTE — Progress Notes (Addendum)
      [address]       [place] [address]             [phone]       1 Day Post-Op Procedure(s) (LRB): VIDEO BRONCHOSCOPY (N/A) VIDEO ASSISTED THORACOSCOPY (VATS)/RIGHT LOWER LOBE WEDGE RESECTION, RIGHT UPPER LOBECTOMY (Right) Lymph Node Dissection (Right)  Subjective: Patient sitting in chair. She has incisional,chest tube pain on the right.  Objective: Vital signs in last 24 hours: Temp:  [97 F (36.1 C)-98.3 F (36.8 C)] 98.3 F (36.8 C) (09/16 0730) Pulse Rate:  [66-86] 73 (09/16 0730) Cardiac Rhythm: Normal sinus rhythm (09/15 2100) Resp:  [9-24] 15 (09/16 0730) BP: (98-129)/(51-114) 128/81 (09/16 0730) SpO2:  [89 %-100 %] 97 % (09/16 0730) Arterial Line BP: (109-151)/(54-66) 151/64 (09/15 1526)     Intake/Output from previous day: 09/15 0701 - 09/16 0700 In: 2094.1 [P.O.:240; I.V.:1604.1; IV Piggyback:250] Out: 1360 [Urine:760; Blood:200; Chest Tube:400]   Physical Exam:  Cardiovascular: RRR Pulmonary: Clear to auscultation on the left and coarse on the right Abdomen: Soft, non tender, bowel sounds present. Extremities: SCDs in place Wounds: Clean and dry.  No erythema or signs of infection. Chest Tube: to suction, small air leak  Lab Results: CBC: Recent Labs    12/31/18 0540  WBC 10.5  HGB 8.4*  HCT 26.1*  PLT 306   BMET:  Recent Labs    12/31/18 0540  NA 137  K 4.0  CL 106  CO2 23  GLUCOSE 72  BUN 18  CREATININE 1.08*  CALCIUM 8.5*    PT/INR: No results for input(s): LABPROT, INR in the last 72 hours. ABG:  INR: Will add last result for INR, ABG once components are confirmed Will add last 4 CBG results once components are confirmed  Assessment/Plan:  1. CV - SR in the 70's. On Amlodipine 2.5 mg daily. 2.  Pulmonary - On 2 liters of oxygen via . Chest tube  With 400 cc of output since surgery. Chest tube is to suction and there is no air leak. CXR this am appears to show small right apical pneumothorax.  Continue chest tube to suction for now. Encourage incentive spirometer. Await final pathology for LN staging. 3. Supplement potassium 4.  ABL anemia-H and H this am decreased to 8.4 and 26.1 5. Decrease IVF and remove foley 6. CBGs 213/66/116. No prior history of diabetes. Will stop accu checks and SS PRN.  7. Has Ultram, Toradol for pain  Donielle M ZimmermanPA-C 12/31/2018,7:33 AM [phone]  Agree with above Doing well. Small air leak, continue suction for 1 more day pulm toilet, and ambulation  Raman Featherston O Danessa Mensch

## 2018-12-31 NOTE — Discharge Summary (Addendum)
Physician Discharge Summary       [address]       [place] [address]             [phone]    Patient ID: Ashley [name] MRN: [id number] DOB/AGE: 1951-01-30 68 y.o.  Admit date: 12/30/2018 Discharge date: 01/07/2019  Admission Diagnoses: 1. Squamous cell cancer 2. Nodule RUL Discharge Diagnoses:  1. S/p bronchoscopy, right video assisted thoracoscopy, right upper lobectomy, mediastinal lymph node sampling, pleural biopsy, and intercostal nerve block 2. History of tobacco abuse 3. History of Adenocarcinoma of left breast ([place]) 4. History of Pure hypercholesterolemia 5. History of Pernicious anemia 6. History of Melanoma ([place]) 7. History of benign, essential hypertension 8. History of Thrombocytosis ([place]) 9. History of GERD (gastroesophageal reflux disease) 10. History of Emphysema of lung ([place]) 11. History of Coronary artery disease 12. History of colon adenomas 13. History of CHF (congestive heart failure) ([place]) 14. History of Ascending aortic aneurysm ([place]) 15. History of arthritis  Consults: None   Procedure (s):  Bronchoscopy - Right Video assisted thoracoscopy - right upper lobectomy - Mediastinal lymph node sampling - pleural biopsy - Intercostal nerve block by Dr. Kipp Brood on 12/30/2018.  History of Presenting Illness: Ashley [name] [id number] y.o.femaleis seen in the office today for evaluation of right upper lobe pulmonary nodules. She has a history of breast adenocarcinoma that was treated in 1995 with a mastectomy, and no radiation. She has a long history of dyspnea, and exertional chest pain. Currently she can only walk about 38min prior to having chest pain. It is relieved with rest, but she has been given nitro patches by her cardiologist as well. She continues to smoke, and has a chronic cough. When she underwent spine surgery back in 2017, she developed respiratory failure. She also lost 50 lbs, and has been unable to regain that weight.    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 Her case was discussed in tumor board, and the decision was made to proceed with surgical resection given that it is localized to the right upper lobe. Dr. Kipp Brood discussed the risks, benefits, and alternatives of the surgery. Patient agreed to proceed with surgery. She underwent bronchoscopy, right video assisted thoracoscopy, right upper lobectomy, mediastinal lymph node sampling, pleural biopsy, and intercostal nerve block on 12/30/2018.  Brief Hospital Course:  The patient remained afebrile and hemodynamically stable. A line and foley were removed early in the post operative course. Chest tube output gradually decreased and there was a small air leak. Daily chest x rays were obtained and remained stable. Air leak eventually resolved and output decreased. Patient developed a fever to 102.7 on 09/18. Blood cultures were negative to date.  In addition, central line was removed.   WBC was increased to 12,900 on 09/20 .  She was put on Vancomycin,Flagyl, and Aztreonam. Respiratory culture showed rare yeast. WBC decreased to 8,700 on 09/23. Per Dr. Kipp Brood, antibiotics were stopped and she needs no oral antibiotic. Chest tube was placed to water seal on 09/18. Chest tube was removed  09/22. Patient is still requiring 3 liters of oxygen so we will arrange for home oxygen. Patient is tolerating a diet and has had a bowel movement. Wounds are clean and dry. Final chest  xray shows right subcutaneous emphysema, ? small right apical pneumothorax, small right pleural effusion and atelectasis. Patient instructed to contact physician who prescribed Hydroxyurea as creatinine, although improved,  is elevated at 1.52. I have decreased her Hydroxyurea dose to 500 mg daily for now. Per Dr. Kipp Brood, patient is felt surgically stable for discharge today.   Latest Vital Signs: Blood pressure 126/75, pulse 75,  temperature 98.1 F (36.7 C), temperature source Oral, resp. rate 19, height 5\' 2"  (1.575 m), weight 51.6 kg, SpO2 100 %.  Physical Exam: Cardiovascular: RRR Pulmonary: Rhonchi again this am. Subcutaneous emphysema right arm and chest.  Abdomen: Soft, non tender, bowel sounds present. Extremities: No LE edema Wounds: Clean and dry.  No erythema or signs of infection.  Discharge Condition: Stable and discharged to home.  Recent laboratory studies:  Lab Results  Component Value Date   WBC 8.7 01/07/2019   HGB 7.5 (L) 01/07/2019   HCT 22.0 (L) 01/07/2019   MCV 106.3 (H) 01/07/2019   PLT 407 (H) 01/07/2019   Lab Results  Component Value Date   NA 137 01/07/2019   K 2.8 (L) 01/07/2019   CL 109 01/07/2019   CO2 20 (L) 01/07/2019   CREATININE 1.52 (H) 01/07/2019   GLUCOSE 100 (H) 01/07/2019      Diagnostic Studies: Dg Chest 2 View  Result Date: 01/07/2019 CLINICAL DATA:  Pneumothorax.  History of lung cancer. EXAM: CHEST - 2 VIEW COMPARISON:  Radiographs of January 06, 2019. FINDINGS: Stable cardiac size. Stable right hilar prominence is noted. Right-sided chest tube has been removed. Due to overlying extensive subcutaneous emphysema, it is difficult to assess for the presence or absence of pneumothorax. Left lung is unremarkable. Stable right basilar opacity is noted concerning for atelectasis or edema with probable small pleural effusion. Bony thorax is unremarkable. IMPRESSION: Right-sided chest tube has been removed. However, due to extensive overlying subcutaneous emphysema, it is difficult to assess for the absence or presence of pneumothorax. Electronically Signed   By: Marijo Conception M.D.   On: 01/07/2019 09:17   Dg Chest 2 View  Result Date: 12/27/2018 CLINICAL DATA:  Preoperative evaluation prior to right lobectomy. EXAM: CHEST - 2 VIEW COMPARISON:  Chest radiograph November 23, 2018 FINDINGS: Normal cardiac and mediastinal contours. Subpleural masslike opacity demonstrated  within the peripheral aspect of the right upper lobe. No pleural effusion or pneumothorax. Lumbar spinal fusion hardware. Thoracic spine degenerative changes. Cervical spinal fusion hardware. IMPRESSION: Masslike opacity within the peripheral right upper lobe compatible with biopsy proven carcinoma. Electronically Signed   By: Lovey Newcomer M.D.   On: 12/27/2018 14:06   Dg Chest Port 1 View  Result Date: 01/06/2019 CLINICAL DATA:  Right pneumothorax. Chest tube in place. EXAM: PORTABLE CHEST 1 VIEW COMPARISON:  01/05/2019 FINDINGS: There is a persistent right pneumothorax. The small component at the right base has resolved. The right apical component is essentially unchanged. Right chest tube remains in good position. Persistent atelectasis/focal consolidation in the right midzone, unchanged. No discrete effusion. Heart size and vascularity are normal. Slight accentuation of the interstitial markings in the left lung is less prominent. No change in the subcutaneous emphysema on the right. IMPRESSION: 1. No change in the small right apical pneumothorax. Resolution of the small basal component of the pneumothorax. 2. No change in the subcutaneous emphysema on the right. 3. Slight improvement in the interstitial markings in the left lung. Electronically Signed   By: Lorriane Shire M.D.   On: 01/06/2019 08:54   Dg Chest Port 1 View  Result Date: 01/05/2019 CLINICAL DATA:  68 year old female with a history of pneumonia and pneumothorax status post VATS with right upper  lobectomy and right lower lobe wedge resection EXAM: PORTABLE CHEST 1 VIEW COMPARISON:  Multiple prior most recent, serial plain film from September 15-September 20 FINDINGS: Unchanged right-sided thoracostomy tube. Right pneumothorax visualized, with sub pulmonic component. Surgical changes in the right hilum with similar appearance of patchy opacity in the right hilum extending peripherally. Persisting subcutaneous/myofacial emphysema.  Reticulonodular opacities in the left mid lung and retrocardiac region. No left pneumothorax. Surgical clips of the left axilla. Surgical changes of the cervical region and lumbar spine IMPRESSION: Recurrence/persisting right pneumothorax, with subpulmonic component. Unchanged thoracostomy tube. Persisting opacity in the right mid lung and to a lesser degree the left lung, potentially atelectasis/consolidation. Unchanged subcutaneous/myofacial emphysema. Electronically Signed   By: Corrie Mckusick D.O.   On: 01/05/2019 08:26   Dg Chest Port 1 View  Result Date: 01/04/2019 CLINICAL DATA:  Follow-up post thoracic surgery with right upper lobe resection. EXAM: PORTABLE CHEST 1 VIEW COMPARISON:  01/03/2019 and older exams. FINDINGS: Small right pneumothorax noted on the previous day's study is no longer visualized. Postsurgical changes on the right with mid lung and perihilar anastomosis staples, adjacent surgical vascular clips and hazy opacity in the right mid to lower lung are unchanged from the previous day's study. Prominent interstitial and bronchovascular markings in the left lung unchanged. Lung consolidation. Right chest tube is stable. Extensive right subcutaneous emphysema is unchanged. IMPRESSION: 1. No residual right pneumothorax. 2. No other change from the previous day's study. Stable right chest tube. Persistent right subcutaneous emphysema. Electronically Signed   By: Lajean Manes M.D.   On: 01/04/2019 05:42   Dg Chest Port 1 View  Result Date: 01/03/2019 CLINICAL DATA:  Reason for exam: pneumothoraxHx Rt lower wedge resection and Rt upper lobectomy on 9/15, HTN, CHF, CAD, Ascending aortic aneurysm, emphysema EXAM: PORTABLE CHEST 1 VIEW COMPARISON:  01/02/2019 and earlier exams. FINDINGS: Small right pneumothorax, best seen along the lateral pleural margin, appears decreased in size from the previous day's exam. Extensive right-sided subcutaneous emphysema has increased. Postsurgical changes with  right perihilar pulmonary anastomosis staples right mid to lower hemithorax surgical vascular clips are unchanged. Right-sided chest tube is stable, tip at the right apex. There are interstitial and hazy airspace opacities the lower lungs, greater on the right, which are unchanged. No left pneumothorax. Right internal jugular line has been removed. IMPRESSION: 1. Slight decrease in the size of the small right sided pneumothorax since the previous day's study. 2. Increased right sided subcutaneous emphysema. 3. Removed right internal jugular central venous line. 4. No other change. Electronically Signed   By: Lajean Manes M.D.   On: 01/03/2019 06:07   Dg Chest Port 1 View  Result Date: 01/02/2019 CLINICAL DATA:  Shortness of breath. Right upper lobectomy and right lower lobe wedge resection 12/30/2018. EXAM: PORTABLE CHEST 1 VIEW COMPARISON:  Radiographs 01/01/2019 and 12/31/2018.  CT 10/14/2018. FINDINGS: 0647 hours. Right IJ central venous catheter and right chest tube are unchanged in position. The overall aeration of the right lung has improved with persistent perihilar opacity. There is a small right-sided pneumothorax with apical and lateral components, increased from the most recent study. Diffuse right chest wall soft tissue emphysema is again noted. There is no mediastinal shift or pneumomediastinum. The heart size and mediastinal contours are stable. The left lung is clear. IMPRESSION: Interval enlargement of small right-sided pneumothorax with stable right chest wall emphysema. Interval improved right lung aeration. Electronically Signed   By: Richardean Sale M.D.   On: 01/02/2019 09:36  Dg Chest Port 1 View  Result Date: 01/01/2019 CLINICAL DATA:  Chest tube present, status post lobectomy EXAM: PORTABLE CHEST 1 VIEW COMPARISON:  12/31/2018 FINDINGS: There is new atelectasis or consolidation of the right midlung with significant volume loss of the right hemithorax, status post lobectomy. There is  a right-sided chest tube in position without significant pneumothorax. There is a significant interval increase in subcutaneous emphysema about the right chest. The left lung is normally aerated. Cardiomegaly. Right neck vascular catheter IMPRESSION: There is new atelectasis or consolidation of the right midlung with significant volume loss of the right hemithorax, status post lobectomy. There is a right-sided chest tube in position without significant pneumothorax. There is a significant interval increase in subcutaneous emphysema about the right chest. The left lung is normally aerated. Electronically Signed   By: Eddie Candle M.D.   On: 01/01/2019 09:28   Dg Chest Port 1 View  Result Date: 12/31/2018 CLINICAL DATA:  Chest tube.  Pneumothorax. EXAM: PORTABLE CHEST 1 VIEW COMPARISON:  12/30/2018 FINDINGS: A right-sided chest tube is in place. There is a small right-sided pneumothorax which appears slightly increased in size from prior study. Postsurgical changes are noted over the right upper lung zone. The right-sided central venous catheter is well positioned. Chronic lung markings are noted bilaterally. Subcutaneous gas is noted along the patient's right flank and right upper chest wall. IMPRESSION: Small right-sided pneumothorax, slightly increased in size from prior study. Otherwise, stable appearance of the chest. Electronically Signed   By: Constance Holster M.D.   On: 12/31/2018 09:13   Dg Chest Port 1 View  Result Date: 12/30/2018 CLINICAL DATA:  Pneumothorax EXAM: PORTABLE CHEST 1 VIEW COMPARISON:  12/26/2018 FINDINGS: Interval postoperative findings of right thoracotomy with right-sided chest tube in position. There is a tiny, less than 5% right apical pneumothorax. Subcutaneous emphysema about the right chest. The left lung is normally aerated. The heart mediastinum are normal. Right neck vascular catheter. IMPRESSION: Interval postoperative findings of right thoracotomy with right-sided chest  tube in position. There is a tiny, less than 5% right apical pneumothorax. Subcutaneous emphysema about the right chest. The left lung is normally aerated. Electronically Signed   By: Eddie Candle M.D.   On: 12/30/2018 13:13     Discharge Medications: Allergies as of 01/07/2019      Reactions   Penicillins Hives, Rash   Did it involve swelling of the face/tongue/throat, SOB, or low BP? No Did it involve sudden or severe rash/hives, skin peeling, or any reaction on the inside of your mouth or nose? No Did you need to seek medical attention at a hospital or doctor's office? No When did it last happen?5-10 year If all above answers are NO, may proceed with cephalosporin use.   Tape Rash   Medipore, Coban, and paper tape CAN be tolerated      Medication List    STOP taking these medications   furosemide 40 MG tablet Commonly known as: LASIX   nitroGLYCERIN 0.1 mg/hr patch Commonly known as: NITRODUR - Dosed in mg/24 hr   potassium chloride SA 20 MEQ tablet Commonly known as: K-DUR   traMADol 50 MG tablet Commonly known as: ULTRAM     TAKE these medications   albuterol 108 (90 Base) MCG/ACT inhaler Commonly known as: VENTOLIN HFA Inhale 2 puffs into the lungs every 6 (six) hours as needed for wheezing or shortness of breath.   amLODipine 2.5 MG tablet Commonly known as: NORVASC Take 1 tablet (2.5 mg  total) by mouth daily. Start taking on: January 11, 2019 What changed: These instructions start on January 11, 2019. If you are unsure what to do until then, ask your doctor or other care provider.   aspirin EC 81 MG tablet Take 81 mg by mouth daily.   Calcium 600+D 600-800 MG-UNIT Tabs Generic drug: Calcium Carb-Cholecalciferol Take 1 tablet by mouth 2 (two) times daily.   clonazePAM 0.5 MG tablet Commonly known as: KLONOPIN Take 1 tablet (0.5 mg total) by mouth at bedtime. If take this at bedtime, do NOT take an Oxycodone at night What changed: additional  instructions   cyanocobalamin 1000 MCG/ML injection Commonly known as: (VITAMIN B-12) INJECT 1 ML INTO THE MUSCLE ONCE MONTHLY AS DIRECTED. What changed: See the new instructions.   gabapentin 300 MG capsule Commonly known as: NEURONTIN Take 300 mg by mouth at bedtime.   guaiFENesin 600 MG 12 hr tablet Commonly known as: MUCINEX Take 1 tablet (600 mg total) by mouth 2 (two) times daily as needed for cough or to loosen phlegm.   hydroxyurea 500 MG capsule Commonly known as: HYDREA TAKE 1 CAPSULE DAILY TUESDAY-FRIDAY AND 2 CAPSULES ON SATURDAY-MONDAY. What changed:   how much to take  how to take this  when to take this  additional instructions   ibandronate 150 MG tablet Commonly known as: BONIVA Take 150 mg by mouth every 30 (thirty) days.   ibuprofen 800 MG tablet Commonly known as: ADVIL Take 800 mg every 8 (eight) hours as needed by mouth for moderate pain.   ipratropium-albuterol 0.5-2.5 (3) MG/3ML Soln Commonly known as: DUONEB Take 3 mLs by nebulization every 6 (six) hours as needed (shortness of breath).   methocarbamol 500 MG tablet Commonly known as: ROBAXIN Take 500 mg by mouth every 6 (six) hours as needed for muscle spasms.   ondansetron 8 MG tablet Commonly known as: ZOFRAN Take 1 tablet (8 mg total) by mouth every 8 (eight) hours as needed for nausea or vomiting.   oxyCODONE 5 MG immediate release tablet Commonly known as: Oxy IR/ROXICODONE Take 1 tablet (5 mg total) by mouth every 4 (four) hours as needed for moderate pain.   pantoprazole 40 MG tablet Commonly known as: PROTONIX Take 40 mg daily by mouth.   polyethylene glycol 17 g packet Commonly known as: MIRALAX / GLYCOLAX Take 17 g by mouth daily as needed for mild constipation.   rosuvastatin 40 MG tablet Commonly known as: CRESTOR Take 40 mg at bedtime by mouth.   Vitamin D-3 25 MCG (1000 UT) Caps Take 1,000 Units daily by mouth.            Durable Medical Equipment  (From  admission, onward)         Start     Ordered   01/07/19 1238  For home use only DME oxygen  Once    Question Answer Comment  Length of Need 6 Months   Mode or (Route) Nasal cannula   Liters per Minute 2   Frequency Only at night (stationary unit needed)   Oxygen conserving device Yes   Oxygen delivery system Gas      01/07/19 1237          Follow Up Appointments: Follow-up Information    Lajuana Matte, MD. Go on 01/16/2019.   Specialty: Thoracic Surgery Why: Appointment time is at 1:00 pm Contact information: [address] [place] [address] [phone]  SignedSharalyn Ink ZimmermanPA-C 01/07/2019, 12:37 PM

## 2019-01-01 ENCOUNTER — Inpatient Hospital Stay (HOSPITAL_COMMUNITY): Payer: Medicare Other

## 2019-01-01 LAB — CBC
HCT: 24.9 % — ABNORMAL LOW (ref 36.0–46.0)
Hemoglobin: 8.2 g/dL — ABNORMAL LOW (ref 12.0–15.0)
MCH: 37.6 pg — ABNORMAL HIGH (ref 26.0–34.0)
MCHC: 32.9 g/dL (ref 30.0–36.0)
MCV: 114.2 fL — ABNORMAL HIGH (ref 80.0–100.0)
Platelets: 258 10*3/uL (ref 150–400)
RBC: 2.18 MIL/uL — ABNORMAL LOW (ref 3.87–5.11)
RDW: 15.7 % — ABNORMAL HIGH (ref 11.5–15.5)
WBC: 7.6 10*3/uL (ref 4.0–10.5)
nRBC: 0 % (ref 0.0–0.2)

## 2019-01-01 LAB — COMPREHENSIVE METABOLIC PANEL
ALT: 13 U/L (ref 0–44)
AST: 21 U/L (ref 15–41)
Albumin: 2.9 g/dL — ABNORMAL LOW (ref 3.5–5.0)
Alkaline Phosphatase: 46 U/L (ref 38–126)
Anion gap: 6 (ref 5–15)
BUN: 18 mg/dL (ref 8–23)
CO2: 23 mmol/L (ref 22–32)
Calcium: 8.3 mg/dL — ABNORMAL LOW (ref 8.9–10.3)
Chloride: 106 mmol/L (ref 98–111)
Creatinine, Ser: 1.3 mg/dL — ABNORMAL HIGH (ref 0.44–1.00)
GFR calc Af Amer: 48 mL/min — ABNORMAL LOW (ref >60)
GFR calc non Af Amer: 42 mL/min — ABNORMAL LOW (ref >60)
Glucose, Bld: 120 mg/dL — ABNORMAL HIGH (ref 70–99)
Potassium: 4.5 mmol/L (ref 3.5–5.1)
Sodium: 135 mmol/L (ref 135–145)
Total Bilirubin: 0.5 mg/dL (ref 0.3–1.2)
Total Protein: 5.7 g/dL — ABNORMAL LOW (ref 6.5–8.1)

## 2019-01-01 LAB — SURGICAL PATHOLOGY

## 2019-01-01 MED ORDER — LEVALBUTEROL HCL 0.63 MG/3ML IN NEBU: 0.6300 mg | INHALATION_SOLUTION | Freq: Three times a day (TID) | RESPIRATORY_TRACT | Status: DC

## 2019-01-01 MED ORDER — IPRATROPIUM-ALBUTEROL 0.5-2.5 (3) MG/3ML IN SOLN
3.0000 mL | Freq: Four times a day (QID) | RESPIRATORY_TRACT | Status: DC
  Administered 2019-01-01: 3 mL via RESPIRATORY_TRACT

## 2019-01-01 MED ORDER — IPRATROPIUM-ALBUTEROL 0.5-2.5 (3) MG/3ML IN SOLN
3.0000 mL | Freq: Two times a day (BID) | RESPIRATORY_TRACT | Status: DC
  Administered 2019-01-01 – 2019-01-07 (×12): 3 mL via RESPIRATORY_TRACT
  Filled 2019-01-01 (×13): qty 3

## 2019-01-01 MED ORDER — GUAIFENESIN ER 600 MG PO TB12
600.0000 mg | ORAL_TABLET | Freq: Two times a day (BID) | ORAL | Status: DC
  Administered 2019-01-01 – 2019-01-07 (×13): 600 mg via ORAL
  Filled 2019-01-01 (×13): qty 1

## 2019-01-01 MED ORDER — IPRATROPIUM-ALBUTEROL 0.5-2.5 (3) MG/3ML IN SOLN
RESPIRATORY_TRACT | Status: AC
  Filled 2019-01-01: qty 3

## 2019-01-01 NOTE — Progress Notes (Cosign Needed Addendum)
      [address]       [place] [address]             [phone]       2 Days Post-Op Procedure(s) (LRB): VIDEO BRONCHOSCOPY (N/A) VIDEO ASSISTED THORACOSCOPY (VATS)/RIGHT LOWER LOBE WEDGE RESECTION, RIGHT UPPER LOBECTOMY (Right) Lymph Node Dissection (Right)  Subjective: Patient had trouble breathing after chest x ray taken, but breathing improved now  Objective: Vital signs in last 24 hours: Temp:  [97.9 F (36.6 C)-98.4 F (36.9 C)] 98 F (36.7 C) (09/17 0406) Pulse Rate:  [42-79] 70 (09/17 0406) Cardiac Rhythm: Normal sinus rhythm (09/16 2344) Resp:  [10-25] 10 (09/17 0406) BP: (107-128)/(57-80) 107/65 (09/17 0406) SpO2:  [92 %-100 %] 99 % (09/17 0406)     Intake/Output from previous day: 09/16 0701 - 09/17 0700 In: 1414.2 [P.O.:440; I.V.:974.2] Out: 765 [Urine:175; Chest Tube:590]   Physical Exam:  Cardiovascular: RRR Pulmonary: Clear to auscultation on the left and coarse on the right. Some subcutaneous emphysema right arm and chest Abdomen: Soft, non tender, bowel sounds present. Extremities: SCDs in place Wounds: Clean and dry.  No erythema or signs of infection. Chest Tube: to suction, +-++air leak  Lab Results: CBC: Recent Labs    12/31/18 0540 01/01/19 0421  WBC 10.5 7.6  HGB 8.4* 8.2*  HCT 26.1* 24.9*  PLT 306 258   BMET:  Recent Labs    12/31/18 0540 01/01/19 0421  NA 137 135  K 4.0 4.5  CL 106 106  CO2 23 23  GLUCOSE 72 120*  BUN 18 18  CREATININE 1.08* 1.30*  CALCIUM 8.5* 8.3*    PT/INR: No results for input(s): LABPROT, INR in the last 72 hours. ABG:  INR: Will add last result for INR, ABG once components are confirmed Will add last 4 CBG results once components are confirmed  Assessment/Plan:  1. CV - SR in the 70's. On Amlodipine 2.5 mg daily. 2.  Pulmonary - On 2 liters of oxygen via [place]. Chest tube  With 590 cc of output last 24 hours. Chest tube is to suction and there is no air leak. CXR this am  appears to show small right apical pneumothorax, subcutaneous emphysema right lateral chest wall. Continue chest tube to suction for now. Chest tube connections are tight and sutures intact. Encourage incentive spirometer. Await final pathology for LN staging. 3.  ABL anemia-H and H this am slightly decreased to 8.2 and 24.9 4. Creatinine increased from 1.08 to 1.3. has been given Toradol;not on ACE/ARB. Monitor   Vianney Kopecky M ZimmermanPA-C 01/01/2019,7:40 AM [phone]

## 2019-01-01 NOTE — Progress Notes (Signed)
XRAY tech informed nurse after getting AM CXR that film worse from yesterday and pt have a slight wheeze while in room.  Nurse went in and assessed the Lungs.  Rt lateral CT -(+) crepitus superior to CT insertion site, near back side of rt breast.  Lungs unable ausculation more tubular sounding. Pt coughed upon request.  Cough is weaker and moderate to weak compared to earlier in night.  Will update PA re: changes.  Monitor showed SR without ectopy.  Pt did sleep most of the night after oxycodone 5mg  PO near HS.  Pt didn't complain of any further pain or discomfort.

## 2019-01-01 NOTE — Progress Notes (Signed)
PA in room and updated on changes noted. Crepititus noted under rt arm, which was expected per PA.   Pt reclining in bed at 45degree, breathing more comfortably.  Will continue with suction to CT.

## 2019-01-01 NOTE — H&P (Signed)
 [address] [place] [address] [phone]  Ashley [name] [facility] Medical Record #[id number] Date of Birth:04/30/1950  Referring:Katragadda, Sreedhar, MD Primary Care:Strader, Lindsey F, NP Primary Cardiologist:No primary care provider on file.  Chief Complaint:No chief complaint on file.   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  Her case was discussed in tumor board, and the decision was made to proceed with surgical resection given that it is localized to the right upper lobe.     Per my last note Ashley [name]68 y.o.femaleis seen in the office today for evaluation of right upper lobe pulmonary nodules. She has a history of breast adenocarcinoma that was treated in 1995 with a mastectomy, and no radiation. She has a long history of dyspnea, and exertional chest pain. Currently she can only walk about 15min prior to having chest pain. It is relieved with rest, but she has been given nitro patches by her cardiologist as well. She continues to smoke, and has a chronic cough. When she underwent spine surgery back in 2017, she developed respiratory failure. She also lost 50 lbs, and has been unable to regain that weight.   Smoking Hx: Last cigarette was today   Current Activity/ Functional Status:  Patientisindependent with mobility/ambulation, transfers, ADL's, IADL's.   Zubrod Score: At the time of surgery this patient's most appropriate activity status/level should be described as: []??0 Normal activity, no symptoms [x]??1 Restricted in physical strenuous activity but ambulatory, able to do out light work []??2 Ambulatory and capable of self care, unable to do work activities, up and  about >50 % of waking hours  []??3 Only limited self care, in bed greater than 50% of waking hours []??4 Completely disabled, no self care, confined to bed or chair []??5 Moribund       Past Medical History:  Diagnosis Date  . Adenocarcinoma of left breast (HCC) 01/09/2016  . Arthritis   . Ascending aortic aneurysm (HCC)   . Cancer (HCC) 1995   breast, left, mastectomy/chemo  . Chest pain    Possibly cardiac. No evidence of ischemia/injury based upon normal troponin I. Chest discomfort could be tachycardia induced supply demand mismatch.   . CHF (congestive heart failure) (HCC) 11/17/2015   after surgery   . Colon adenomas   . Coronary artery disease   . Dyspnea    with exertion  . Emphysema of lung (HCC) 11/17/2015  . Essential hypertension, benign   . GERD (gastroesophageal reflux disease)   . Hyperlipidemia   . Hypertension   . Melanoma (HCC) 01/09/2016  . Palpitations   . Pernicious anemia 03/06/2016  . Pure hypercholesterolemia   . Thrombocythemia, essential (HCC) 01/09/2016  . Thrombocytosis (HCC)    Idiopathic         Past Surgical History:  Procedure Laterality Date  . ANTERIOR AND POSTERIOR REPAIR N/A 12/09/2014   Procedure: ANTERIOR (CYSTOCELE) AND POSTERIOR REPAIR (RECTOCELE); Surgeon: Scott MacDiarmid, MD; Location: [facility]; Service: Urology; Laterality: N/A;  . ANTERIOR CERVICAL DECOMPRESSION/DISCECTOMY FUSION 4 LEVELS Right 10/03/2016   Procedure: ANTERIOR CERVICAL DECOMPRESSION FUSION, CERVICAL 4-5, CERVICAL 5-6, CERVICAL 6-7, CERVICAL 7 TO THORACIC 1 WITH INSTRUMENTATION AND ALLOGRAFT; Surgeon: Dumonski, Mark, MD; Location: [facility]; Service: Orthopedics; Laterality: Right; ANTERIOR CERVICAL DECOMPRESSION FUSION, CERVICAL 4-5, CERVICAL 5-6, CERVICAL 6-7, CERVICAL 7 TO THORACIC 1 WITH INSTRUMENTATION AND ALLOGRAFT;   REQUEST 4 HO  . ANTERIOR LAT LUMBAR FUSION Left  11/16/2015   Procedure: LEFT SIDED LATERAL INTERBODY FUSION, LUMBAR 2-3, LUMBAR 3-4, LUMBAR 4-5 WITH INSTRUMENTATION; Surgeon: Mark Dumonski, MD; Location: [facility]; Service: Orthopedics; Laterality: Left; LEFT SIDED LATEARL INTERBODY FUSION, LUMBAR 2-3, LUMBAR 3-4, LUMBAR 4-5 WITH INSTRUMENTATION   . APPENDECTOMY    . BONE MARROW ASPIRATION  07/2012  . BONE MARROW BIOPSY  07/2012  . BREAST SURGERY    . CARDIAC CATHETERIZATION    . CARDIAC CATHETERIZATION N/A 01/20/2016   Procedure: Left Heart Cath and Coronary Angiography; Surgeon: Christopher D McAlhany, MD; Location: [facility]; Service: Cardiovascular; Laterality: N/A;  . COLONOSCOPY  11/29/2010   Procedure: COLONOSCOPY; Surgeon: Najeeb U Rehman, MD; Location: [facility]; Service: Endoscopy; Laterality: N/A;  . COLONOSCOPY N/A 02/18/2014   Procedure: COLONOSCOPY; Surgeon: Najeeb U Rehman, MD; Location: [facility]; Service: Endoscopy; Laterality: N/A; 1030  . COLONOSCOPY N/A 02/25/2017   Procedure: COLONOSCOPY; Surgeon: Rehman, Najeeb U, MD; Location: [facility]; Service: Endoscopy; Laterality: N/A; 10:55  . CYSTOSCOPY N/A 12/09/2014   Procedure: CYSTOSCOPY; Surgeon: Scott MacDiarmid, MD; Location: [facility]; Service: Urology; Laterality: N/A;  . ESOPHAGEAL DILATION N/A 02/25/2017   Procedure: ESOPHAGEAL DILATION; Surgeon: Rehman, Najeeb U, MD; [facility]: [facility]; Service: Endoscopy; Laterality: N/A;  . ESOPHAGOGASTRODUODENOSCOPY N/A 02/25/2017   Procedure: ESOPHAGOGASTRODUODENOSCOPY (EGD); Surgeon: Rehman, Najeeb U, MD; [facility]; Service: Endoscopy; Laterality: N/A;  . IR GENERIC HISTORICAL  01/11/2016   IR RADIOLOGY PERIPHERAL GUIDED IV START 01/11/2016 Kelly Osborne, PA-C [facility]  . IR GENERIC HISTORICAL  01/11/2016   IR US GUIDE VASC ACCESS RIGHT 01/11/2016 Kelly Osborne, PA-C [facility]  . MASTECTOMY     left  . OVARIAN CYST SURGERY     x2   . POLYPECTOMY  02/25/2017   Procedure: POLYPECTOMY; Surgeon: Rehman, Najeeb U, MD; [facility]; Service: Endoscopy;;  . SALPINGOOPHORECTOMY Bilateral 12/09/2014   Procedure: SALPINGO OOPHORECTOMY; Surgeon: Sheronette Cousins, MD; Location: [facility]; Service: Gynecology; Laterality: Bilateral;  . TUBAL LIGATION    . VAGINAL HYSTERECTOMY N/A 12/09/2014   Procedure: HYSTERECTOMY VAGINAL; Surgeon: Sheronette Cousins, MD; Location: [facility]; Service: Gynecology; Laterality: N/A;         Family History  Problem Relation Age of Onset  . Hypertension Mother   . Heart failure Mother   . Congestive Heart Failure Mother   . COPD Mother   . Pernicious anemia Mother   . Cancer Mother    lung  . Hypertension Father   . CAD Father   . Heart attack Father   . Hypertension Sister   . Cancer Other   . Celiac disease Other      Social History       Tobacco Use  Smoking Status Current Every Day Smoker  . Packs/day: 0.50  . Years: 50.00  . Pack years: 25.00  . Types: Cigarettes  Smokeless Tobacco Never Used   Social History      Substance and Sexual Activity  Alcohol Use No          Allergies  Allergen Reactions  . Penicillins Hives and Rash    Has patient had a PCN reaction causing immediate rash, facial/tongue/throat swelling, SOB or lightheadedness with hypotension: Yes Has patient had a PCN reaction causing severe rash involving mucus membranes or skin necrosis: Yes Has patient had a PCN reaction that required hospitalization No Has patient had a PCN reaction occurring within the last 10 years:   Yes If all of the above answers are "NO", then may proceed with Cephalosporin use.   . Tape Rash    Medipore, Coban, and paper tape CAN be tolerated          Current Outpatient Medications  Medication Sig Dispense Refill  . albuterol (PROVENTIL HFA;VENTOLIN HFA) 108 (90 Base) MCG/ACT inhaler Inhale 2 puffs  into the lungs every 6 (six) hours as needed for wheezing or shortness of breath. 1 Inhaler 2  . amLODipine (NORVASC) 2.5 MG tablet Take 2.5 mg by mouth daily.     . aspirin EC 81 MG tablet Take 81 mg by mouth daily.    . Calcium Carb-Cholecalciferol (CALCIUM 600+D) 600-800 MG-UNIT TABS Take 1 tablet by mouth 2 (two) times daily.    . Cholecalciferol (VITAMIN D-3) 1000 units CAPS Take 1,000 Units daily by mouth.     . clonazePAM (KLONOPIN) 0.5 MG tablet Take 1 tablet (0.5 mg total) by mouth at bedtime. 90 tablet 2  . cyanocobalamin (,VITAMIN B-12,) 1000 MCG/ML injection INJECT 1 ML INTO THE MUSCLE ONCE MONTHLY AS DIRECTED. 1 mL 5  . furosemide (LASIX) 40 MG tablet Take 40 mg by mouth daily. Can go up to 80 mg daily per pt    . gabapentin (NEURONTIN) 300 MG capsule Take 300 mg by mouth at bedtime.    . hydroxyurea (HYDREA) 500 MG capsule TAKE 1 CAPSULE DAILY TUESDAY-FRIDAY AND 2 CAPSULES ON SATURDAY-MONDAY. 120 capsule 4  . ibandronate (BONIVA) 150 MG tablet Take 1 tablet by mouth every 30 (thirty) days.    . ibuprofen (ADVIL,MOTRIN) 800 MG tablet Take 800 mg every 8 (eight) hours as needed by mouth for moderate pain.    . ipratropium-albuterol (DUONEB) 0.5-2.5 (3) MG/3ML SOLN Take 3 mLs by nebulization every 6 (six) hours as needed (shortness of breath). 360 mL 0  . methocarbamol (ROBAXIN) 500 MG tablet Take 500 mg daily as needed by mouth for muscle spasms.     . nitroGLYCERIN (NITRODUR - DOSED IN MG/24 HR) 0.1 mg/hr patch Place 0.1 mg onto the skin daily.     . ondansetron (ZOFRAN) 8 MG tablet Take 1 tablet (8 mg total) by mouth every 8 (eight) hours as needed for nausea or vomiting. 20 tablet 5  . pantoprazole (PROTONIX) 40 MG tablet Take 40 mg daily by mouth.    . potassium chloride SA (K-DUR,KLOR-CON) 20 MEQ tablet Take 1 tablet (20 mEq total) by mouth 3 (three) times daily. 90 tablet 5  . rosuvastatin (CRESTOR) 40 MG tablet Take 40 mg at bedtime by mouth.     .  traMADol (ULTRAM) 50 MG tablet Take 50 mg by mouth as needed.      No current facility-administered medications for this visit.     Review of Systems: Constitutional:weight loss Cardiovascular:exertional chest pain, lower extremity swelling Pulmonary:dyspnea, cough GI:constipation MSK:back pain Neuro:no headaches     PHYSICAL EXAMINATION:    Today's Vitals   12/12/18 1129  BP: 120/77  Pulse: 65  Resp: 20  Temp: (!) 97.5 F (36.4 C)  TempSrc: Skin  SpO2: 95%  Weight: 111 lb (50.3 kg)  Height: 5' 2" (1.575 m)   Body mass index is 20.3 kg/m.  General: non-toxic, appearsolderstated age. Frail HEENT: NCAT. Moist mucous membranes, Oropharynx clear. Nocervical or supraclavicular adenopathy. Neuro: Alert, Oriented X 3. Non focal . Psych: appropriate mood. Cardiovascular:RRR, no murmur Pulmonary:Clear B. No crackles GI:soft, ND MSK: ambulateswell.   Diagnostic Studies & Laboratory data:  Recent Radiology Findings:     Imaging Results  Ct Chest Wo Contrast  Result Date: 10/14/2018 CLINICAL DATA: LEFT upper lobe pulmonary nodule. History of breast cancer EXAM: CT CHEST WITHOUT CONTRAST TECHNIQUE: Multidetector CT imaging of the chest was performed following the standard protocol without IV contrast. COMPARISON: CT 04/15/2017 PET-CT 07/29/2017 FINDINGS: Cardiovascular: Coronary artery calcification and aortic atherosclerotic calcification. Ascending aorta measures 42 mm similar to comparison exam Mediastinum/Nodes: No axillary or supraclavicular lymphadenopathy. No mediastinal or hilar adenopathy. Lungs/Pleura: Spiculated nodule in the RIGHT upper lobe measures 12 mm x 9 mm (image 63/4) compared to 10 mm x 5 mm. Lesion is increased in density. More inferiorly lobular pleural base nodule measuring 17 mm by 7 mm (image 72/4) is new from prior. Additional RIGHT upper lobe peripheral nodule measures 8 mm on image 60/4 is new from prior. In  LEFT upper lobe 3 mm nodule and 3 mm nodule image 28/4 new from prior. Benign appearing nodule along the RIGHT oblique fissure measures 7 mm unchanged. Upper Abdomen: Limited view of the liver, kidneys, pancreas are unremarkable. Normal adrenal glands. Musculoskeletal: Posterior lumbar fusion IMPRESSION: 1. Enlarging RIGHT upper lobe spiculated pulmonary nodule and new adjacent pleural base nodule. Recommend FDG PET scan to determine malignant potential. 2. Several additional peripheral nodules in LEFT and RIGHT lung are new from prior. Recommend attention on follow-up. 3. Stable aneurysmal dilatation of ascending aorta. Electronically Signed By: Stewart Edmunds M.D. On: 10/14/2018 12:37   Nm Pet Image Restag (ps) Skull Base To Thigh  Result Date: 10/24/2018 CLINICAL DATA: Subsequent treatment strategy for new right upper lobe pulmonary nodules. Remote history of breast cancer. EXAM: NUCLEAR MEDICINE PET SKULL BASE TO THIGH TECHNIQUE: 5.5 mCi F-18 FDG was injected intravenously. Full-ring PET imaging was performed from the skull base to thigh after the radiotracer. CT data was obtained and used for attenuation correction and anatomic localization. Fasting blood glucose: 137 mg/dl COMPARISON: Chest CT 10/14/2018. CTA of the chest, abdomen and pelvis 04/15/2018. FINDINGS: Mediastinal blood pool activity: SUV max 2.2 Liver activity: SUV max NA NECK: No areas of abnormal hypermetabolism. Incidental CT findings: No cervical adenopathy. Left carotid atherosclerosis. CHEST: Hypermetabolism corresponding to the right upper lobe spiculated pulmonary nodule. This measures a 1.0 cm and a S.U.V. max of 8.0 on image 33/8. Just inferior and lateral to this, a pleural-based nodule measures 1.8 cm and a S.U.V. max of 10.0 on 37/8. No thoracic nodal hypermetabolism. Incidental CT findings: Deferred to recent diagnostic CT. Advanced bullous type emphysema. Other pulmonary nodules detailed on the prior exam are below PET  resolution. Ascending aortic dilatation has been detailed previously. Tortuous thoracic aorta. Cardiomegaly. Aortic and coronary artery atherosclerosis. No axillary adenopathy. ABDOMEN/PELVIS: No abdominopelvic parenchymal or nodal hypermetabolism. Incidental CT findings: Right adrenal enlargement is likely related to hyperplasia. Mild chronic left adrenal nodularity. Abdominal aortic atherosclerosis. Low-density bilateral renal lesions are likely cysts. There also subcentimeter hyperattenuating right renal lesions which are indeterminate. Hyperattenuation within the left renal collecting system measures 1.7 cm on image 119/4. This may have been present on prior exams including back to 01/21/2017. Large colonic stool burden. Pelvic floor laxity. Hysterectomy. Pancreatic parenchymal calcifications likely represent chronic calcific pancreatitis. SKELETON: Hypermetabolism about the left facets at approximately the C3-4 level is just cephalad to surgical changes and favored to be degenerative. Incidental CT findings: Cervical and lumbar spine fixation. IMPRESSION: 1. Hypermetabolic right upper lobe pulmonary nodules which are suspicious for synchronous primary bronchogenic carcinomas. Metastatic disease felt less likely. 2. No evidence of thoracic nodal hypermetabolism or typical findings   of distant metastasis. 3. Aortic atherosclerosis (ICD10-I70.0), coronary artery atherosclerosis and emphysema (ICD10-J43.9). 4. Ascending aortic aneurysm, as detailed on prior exams. 5. Hyperattenuation in the interpolar left renal collecting system. This is felt to have been present over prior exams, favoring a benign etiology (i.e. The sequelae of prior infection). Consider correlation with urinalysis. Recommend attention on follow-up. Electronically Signed By: Kyle Talbot M.D. On: 10/24/2018 16:59    MRI brain 11/27/18 - No evidence of intracranial metastatic disease.  - A few small scattered signal changes within the  cerebral white matter are nonspecific, but consistent with chronic small vessel ischemic disease.  - Apparent signal abnormality within the partially imaged C3 vertebra, although this may be related to susceptibility artifact from spinal fusion hardware immediately beneath this level.   I have independently reviewed the above radiology studies and reviewed the findings with the patient.   Recent Lab Findings: Recent Labs       Lab Results  Component Value Date   WBC 6.5 10/07/2018   HGB 11.3 (L) 10/07/2018   HCT 34.3 (L) 10/07/2018   PLT 390 10/07/2018   GLUCOSE 99 10/07/2018   ALT 15 10/07/2018   AST 24 10/07/2018   NA 137 10/07/2018   K 3.9 10/07/2018   CL 100 10/07/2018   CREATININE 1.90 (H) 10/07/2018   BUN 25 (H) 10/07/2018   CO2 26 10/07/2018   INR 0.99 10/01/2017       PFTs:11/03/18 - FVC:107% - FEV1:82% -DLCO:48%    Problem List:  -CT chest 10/14/18 PET/CT 10/24/18 -  1.2cm right upper lobe spiculated nodule. Increased in sized. SUV 8 1.7cm right upper lobe pleural based nodule. 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  Localized disease, marginal pulmonary function.  I explained to her that prior to resection, we will assess the lymph nodes intraoperatively.  If positive, the lobectomy will be aborted.  Also given her marginal lung function, there is a good chance that she will require supplemental O2 following the resection.  She is tentatively scheduled for a bronchoscopy, Right VATS, mediastinal lymph node sampling, and right upper lobectomy     Letroy Vazguez O  Darcey Cardy  

## 2019-01-01 NOTE — Progress Notes (Signed)
RT NOTE: RT went by patient's room to start CPT with flutter. RT noticed patient was sleeping and family member asked if we could hold CPT/flutter until the next time. RT will continue to monitor as needed.

## 2019-01-01 NOTE — Progress Notes (Signed)
Holding flutter at this time due to patient resting.

## 2019-01-02 ENCOUNTER — Inpatient Hospital Stay (HOSPITAL_COMMUNITY): Payer: Medicare Other

## 2019-01-02 LAB — BASIC METABOLIC PANEL
Anion gap: 7 (ref 5–15)
BUN: 17 mg/dL (ref 8–23)
CO2: 23 mmol/L (ref 22–32)
Calcium: 7.8 mg/dL — ABNORMAL LOW (ref 8.9–10.3)
Chloride: 107 mmol/L (ref 98–111)
Creatinine, Ser: 1.55 mg/dL — ABNORMAL HIGH (ref 0.44–1.00)
GFR calc Af Amer: 39 mL/min — ABNORMAL LOW (ref >60)
GFR calc non Af Amer: 34 mL/min — ABNORMAL LOW (ref >60)
Glucose, Bld: 94 mg/dL (ref 70–99)
Potassium: 3.4 mmol/L — ABNORMAL LOW (ref 3.5–5.1)
Sodium: 137 mmol/L (ref 135–145)

## 2019-01-02 LAB — URINALYSIS, ROUTINE W REFLEX MICROSCOPIC
Bilirubin Urine: NEGATIVE
Glucose, UA: NEGATIVE mg/dL
Ketones, ur: NEGATIVE mg/dL
Leukocytes,Ua: NEGATIVE
Nitrite: NEGATIVE
Protein, ur: 30 mg/dL — AB
Specific Gravity, Urine: 1.016 (ref 1.005–1.030)
pH: 5 (ref 5.0–8.0)

## 2019-01-02 MED ORDER — INFLUENZA VAC A&B SA ADJ QUAD 0.5 ML IM PRSY
0.5000 mL | PREFILLED_SYRINGE | INTRAMUSCULAR | Status: DC
  Filled 2019-01-02: qty 0.5

## 2019-01-02 MED ORDER — ALBUMIN HUMAN 5 % IV SOLN
12.5000 g | Freq: Once | INTRAVENOUS | Status: AC
  Administered 2019-01-02: 04:00:00 12.5 g via INTRAVENOUS
  Filled 2019-01-02: qty 250

## 2019-01-02 MED ORDER — SODIUM CHLORIDE 0.9 % IV SOLN
INTRAVENOUS | Status: AC
  Administered 2019-01-02: 19:00:00 via INTRAVENOUS

## 2019-01-02 MED ORDER — ENOXAPARIN SODIUM 30 MG/0.3ML ~~LOC~~ SOLN
30.0000 mg | SUBCUTANEOUS | Status: DC
  Administered 2019-01-02 – 2019-01-06 (×5): 30 mg via SUBCUTANEOUS
  Filled 2019-01-02 (×5): qty 0.3

## 2019-01-02 MED ORDER — POTASSIUM CHLORIDE CRYS ER 20 MEQ PO TBCR
30.0000 meq | EXTENDED_RELEASE_TABLET | Freq: Once | ORAL | Status: AC
  Administered 2019-01-02: 30 meq via ORAL
  Filled 2019-01-02: qty 1

## 2019-01-02 MED ORDER — ALBUMIN HUMAN 5 % IV SOLN
12.5000 g | Freq: Once | INTRAVENOUS | Status: AC
  Administered 2019-01-02: 06:00:00 12.5 g via INTRAVENOUS
  Filled 2019-01-02: qty 250

## 2019-01-02 MED ORDER — SODIUM CHLORIDE 0.9 % IV BOLUS
500.0000 mL | Freq: Once | INTRAVENOUS | Status: AC
  Administered 2019-01-02: 500 mL via INTRAVENOUS

## 2019-01-02 MED ORDER — ALBUMIN HUMAN 5 % IV SOLN
12.5000 g | Freq: Once | INTRAVENOUS | Status: AC
  Administered 2019-01-02: 12:00:00 12.5 g via INTRAVENOUS
  Filled 2019-01-02: qty 250

## 2019-01-02 NOTE — Progress Notes (Signed)
Pt spiked temp of 102.7 bP 96/63 given scheduled tylenol. Notified on call Bartle. No new orders given. Directed to encourage pt use incentive spirometer.

## 2019-01-02 NOTE — Plan of Care (Signed)
Progressing

## 2019-01-02 NOTE — Progress Notes (Signed)
Pt's BP 67/46(53) Hr 100 Temp 101.9. Alerted Dr.Bartle received order for Albumin 5% 1 unit with permission to give second unit if BP does to not increase.

## 2019-01-02 NOTE — Progress Notes (Addendum)
      [address]       [place] [address]             [phone]       3 Days Post-Op Procedure(s) (LRB): VIDEO BRONCHOSCOPY (N/A) VIDEO ASSISTED THORACOSCOPY (VATS)/RIGHT LOWER LOBE WEDGE RESECTION, RIGHT UPPER LOBECTOMY (Right) Lymph Node Dissection (Right)  Subjective: Patient tired this am as did not sleep much.  Objective: Vital signs in last 24 hours: Temp:  [98 F (36.7 C)-102.7 F (39.3 C)] 100.5 F (38.1 C) (09/18 0516) Pulse Rate:  [70-113] 93 (09/18 0304) Cardiac Rhythm: Sinus tachycardia (09/17 2050) Resp:  [12-23] 21 (09/18 0304) BP: (73-120)/(47-70) 73/47 (09/18 0304) SpO2:  [93 %-100 %] 98 % (09/18 0304)     Intake/Output from previous day: 09/17 0701 - 09/18 0700 In: 396.3 [P.O.:360; I.V.:36.3] Out: 720 [Urine:350; Chest Tube:370]   Physical Exam:  Cardiovascular: RRR Pulmonary: Clear to auscultation on the left and coarse on the right. Some subcutaneous emphysema right arm and chest Abdomen: Soft, non tender, bowel sounds present. Extremities: SCDs in place Wounds: Clean and dry.  No erythema or signs of infection. Chest Tube: to suction, +-++air leak with cough  Lab Results: CBC: Recent Labs    12/31/18 0540 01/01/19 0421  WBC 10.5 7.6  HGB 8.4* 8.2*  HCT 26.1* 24.9*  PLT 306 258   BMET:  Recent Labs    01/01/19 0421 01/02/19 0424  NA 135 137  K 4.5 3.4*  CL 106 107  CO2 23 23  GLUCOSE 120* 94  BUN 18 17  CREATININE 1.30* 1.55*  CALCIUM 8.3* 7.8*    PT/INR: No results for input(s): LABPROT, INR in the last 72 hours. ABG:  INR: Will add last result for INR, ABG once components are confirmed Will add last 4 CBG results once components are confirmed  Assessment/Plan:  1. CV - SR in the 70's and hypotensive. Will stop Amlodipine. Given Albumin. May be developing sepsis. 2.  Pulmonary - On 2 liters of oxygen via [place]. Chest tube  With 370 cc of output last 24 hours. Chest tube is to suction and there an air  leak with cough. I am unable to view CXR this am. Continue chest tube to suction for now. Chest tube connections are tight and sutures intact. Encourage incentive spirometer. Await final pathology for LN staging. 3.  ABL anemia-H and H yesterday decreased to 8.2 and 24.9 4. Creatinine increased from 1.3 to 1.55. Previously given Toradol. Was taking Lasix prior to surgery so will stop this as well. 5. Fever to 102.7 earlier this am. No sign of wound infection. Await CBC results. Will try to view CXR to see if developing PNA.  Check UA and blood culture. Has central line so would like to remove but will discuss with Dr. Kipp Brood. Will also discuss if should give empiric antibiotics 6. Supplement potassium  Donielle M ZimmermanPA-C 01/02/2019,7:30 AM [phone]  CXR shows improved atelectasis with persistent apical pneumothorax. Ok to remove CVL Continue aggressive pulmonary toilet. CT to [place]

## 2019-01-02 NOTE — Progress Notes (Addendum)
Physician notified: [place] At: [phone]  Regarding: BP remains soft, 70sys MAPS 55-59. Pt drowsy with poor oral intake. ? Start IVF  Awaiting return response.   Returned Response at: 0943  Order(s): Give 53ml NS bolus. Will place orders for continuous IVF.   Bolus started by this RN, pt still sleeping. Chest tube is to water seal per order this AM. Continue to monitor pt s/s and BP. Lab collected [facility] x2. Will DC IJ CVL and awaiting urine to send for sample. Daughter, Amy called and updated by this RN.

## 2019-01-02 NOTE — Care Management Important Message (Signed)
Important Message  Patient Details  Name: Ashley [name] MRN: [id number] Date of Birth: 27-Jan-1951   Medicare Important Message Given:  Yes     Memory Argue 01/02/2019, 4:27 PM

## 2019-01-03 ENCOUNTER — Inpatient Hospital Stay (HOSPITAL_COMMUNITY): Payer: Medicare Other

## 2019-01-03 LAB — BASIC METABOLIC PANEL
Anion gap: 11 (ref 5–15)
BUN: 20 mg/dL (ref 8–23)
CO2: 17 mmol/L — ABNORMAL LOW (ref 22–32)
Calcium: 7.7 mg/dL — ABNORMAL LOW (ref 8.9–10.3)
Chloride: 106 mmol/L (ref 98–111)
Creatinine, Ser: 1.39 mg/dL — ABNORMAL HIGH (ref 0.44–1.00)
GFR calc Af Amer: 44 mL/min — ABNORMAL LOW (ref >60)
GFR calc non Af Amer: 38 mL/min — ABNORMAL LOW (ref >60)
Glucose, Bld: 85 mg/dL (ref 70–99)
Potassium: 4.2 mmol/L (ref 3.5–5.1)
Sodium: 134 mmol/L — ABNORMAL LOW (ref 135–145)

## 2019-01-03 LAB — CBC
HCT: 24.9 % — ABNORMAL LOW (ref 36.0–46.0)
Hemoglobin: 7.9 g/dL — ABNORMAL LOW (ref 12.0–15.0)
MCH: 36.6 pg — ABNORMAL HIGH (ref 26.0–34.0)
MCHC: 31.7 g/dL (ref 30.0–36.0)
MCV: 115.3 fL — ABNORMAL HIGH (ref 80.0–100.0)
Platelets: 242 10*3/uL (ref 150–400)
RBC: 2.16 MIL/uL — ABNORMAL LOW (ref 3.87–5.11)
RDW: 15.9 % — ABNORMAL HIGH (ref 11.5–15.5)
WBC: 8.4 10*3/uL (ref 4.0–10.5)
nRBC: 0 % (ref 0.0–0.2)

## 2019-01-03 NOTE — Plan of Care (Signed)
  Problem: Clinical Measurements: Goal: Ability to maintain clinical measurements within normal limits will improve Outcome: Progressing Goal: Postoperative complications will be avoided or minimized Outcome: Progressing   Problem: Skin Integrity: Goal: Demonstration of wound healing without infection will improve Outcome: Progressing   Problem: Education: Goal: Knowledge of General Education information will improve Description: Including pain rating scale, medication(s)/side effects and non-pharmacologic comfort measures Outcome: Progressing   Problem: Health Behavior/Discharge Planning: Goal: Ability to manage health-related needs will improve Outcome: Progressing

## 2019-01-03 NOTE — Progress Notes (Signed)
      [address]       [place] [address]             [phone]      4 Days Post-Op Procedure(s) (LRB): VIDEO BRONCHOSCOPY (N/A) VIDEO ASSISTED THORACOSCOPY (VATS)/RIGHT LOWER LOBE WEDGE RESECTION, RIGHT UPPER LOBECTOMY (Right) Lymph Node Dissection (Right) Subjective: Feels okay this morning, sleepy.   Objective: Vital signs in last 24 hours: Temp:  [97.7 F (36.5 C)-98.4 F (36.9 C)] 98.4 F (36.9 C) (09/19 0730) Pulse Rate:  [25-107] 86 (09/19 0436) Cardiac Rhythm: Normal sinus rhythm (09/19 0730) Resp:  [13-33] 15 (09/19 0436) BP: (69-110)/(42-75) 98/61 (09/19 0436) SpO2:  [82 %-100 %] 97 % (09/19 0436)     Intake/Output from previous day: 09/18 0701 - 09/19 0700 In: 1193.8 [P.O.:240; I.V.:953.8] Out: 922 [Urine:600; Chest Tube:322] Intake/Output this shift: No intake/output data recorded.  General appearance: alert, cooperative and no distress Heart: regular rate and rhythm, S1, S2 normal, no murmur, click, rub or gallop Lungs: diffuse rhonchi thoughout Abdomen: soft, non-tender; bowel sounds normal; no masses,  no organomegaly Extremities: extremities normal, atraumatic, no cyanosis or edema Wound: clean and dry  Lab Results: Recent Labs    01/01/19 0421 01/03/19 0221  WBC 7.6 8.4  HGB 8.2* 7.9*  HCT 24.9* 24.9*  PLT 258 242   BMET:  Recent Labs    01/02/19 0424 01/03/19 0221  NA 137 134*  K 3.4* 4.2  CL 107 106  CO2 23 17*  GLUCOSE 94 85  BUN 17 20  CREATININE 1.55* 1.39*  CALCIUM 7.8* 7.7*    PT/INR: No results for input(s): LABPROT, INR in the last 72 hours. ABG    Component Value Date/Time   PHART 7.447 12/26/2018 1352   HCO3 25.2 12/26/2018 1352   TCO2 27 10/01/2017 1441   ACIDBASEDEF 1.0 10/03/2016 1137   O2SAT 94.3 12/26/2018 1352   CBG (last 3)  No results for input(s): GLUCAP in the last 72 hours.  Assessment/Plan: S/P Procedure(s) (LRB): VIDEO BRONCHOSCOPY (N/A) VIDEO ASSISTED THORACOSCOPY  (VATS)/RIGHT LOWER LOBE WEDGE RESECTION, RIGHT UPPER LOBECTOMY (Right) Lymph Node Dissection (Right)  1. CV-NSR in the 80s, BP well controlled.  2. Pulm- improved right pneumothorax on WS but increase in right subcutaneous air. Will monitor closely. Will hold off on removing CT due to subq air. Remains on 2L [place].  3. Acute blood loss anemia-7.9/24.9, drifted down slighly from 9/17 labs. Continue to monitor. 4. Blood glucose well controlled 5. ID- no recent fevers. WBC 8.4. No growth so far on BC x 2 5. Renal-creatinine 1.39, electrolytes okay  Plan: Continue CT to Water seal. CXR in the AM to re-evaluate. Air leak is 4+ with cough with a large title wave. Continue mucinex for secretions. Encouraged incentive spirometer.     LOS: 4 days    Elgie Collard 01/03/2019

## 2019-01-03 NOTE — Progress Notes (Signed)
Right IJ removed and was intact, pt tolerated well. Will continue to monitor.

## 2019-01-03 NOTE — Plan of Care (Signed)
  Problem: Education: Goal: Required Educational Video(s) Outcome: Progressing   Problem: Clinical Measurements: Goal: Ability to maintain clinical measurements within normal limits will improve Outcome: Progressing Goal: Postoperative complications will be avoided or minimized Outcome: Progressing   Problem: Skin Integrity: Goal: Demonstration of wound healing without infection will improve Outcome: Progressing   

## 2019-01-04 ENCOUNTER — Inpatient Hospital Stay (HOSPITAL_COMMUNITY): Payer: Medicare Other

## 2019-01-04 LAB — URINALYSIS, COMPLETE (UACMP) WITH MICROSCOPIC
Bilirubin Urine: NEGATIVE
Glucose, UA: 50 mg/dL — AB
Ketones, ur: NEGATIVE mg/dL
Leukocytes,Ua: NEGATIVE
Nitrite: NEGATIVE
Protein, ur: 100 mg/dL — AB
Specific Gravity, Urine: 1.016 (ref 1.005–1.030)
pH: 5 (ref 5.0–8.0)

## 2019-01-04 LAB — CBC
HCT: 24.8 % — ABNORMAL LOW (ref 36.0–46.0)
Hemoglobin: 8.2 g/dL — ABNORMAL LOW (ref 12.0–15.0)
MCH: 36.9 pg — ABNORMAL HIGH (ref 26.0–34.0)
MCHC: 33.1 g/dL (ref 30.0–36.0)
MCV: 111.7 fL — ABNORMAL HIGH (ref 80.0–100.0)
Platelets: 306 10*3/uL (ref 150–400)
RBC: 2.22 MIL/uL — ABNORMAL LOW (ref 3.87–5.11)
RDW: 16.2 % — ABNORMAL HIGH (ref 11.5–15.5)
WBC: 12.9 10*3/uL — ABNORMAL HIGH (ref 4.0–10.5)
nRBC: 0 % (ref 0.0–0.2)

## 2019-01-04 LAB — BASIC METABOLIC PANEL
Anion gap: 8 (ref 5–15)
BUN: 27 mg/dL — ABNORMAL HIGH (ref 8–23)
CO2: 22 mmol/L (ref 22–32)
Calcium: 8.1 mg/dL — ABNORMAL LOW (ref 8.9–10.3)
Chloride: 106 mmol/L (ref 98–111)
Creatinine, Ser: 1.86 mg/dL — ABNORMAL HIGH (ref 0.44–1.00)
GFR calc Af Amer: 31 mL/min — ABNORMAL LOW (ref >60)
GFR calc non Af Amer: 27 mL/min — ABNORMAL LOW (ref >60)
Glucose, Bld: 110 mg/dL — ABNORMAL HIGH (ref 70–99)
Potassium: 4.2 mmol/L (ref 3.5–5.1)
Sodium: 136 mmol/L (ref 135–145)

## 2019-01-04 MED ORDER — SODIUM CHLORIDE 0.9 % IV SOLN
Freq: Once | INTRAVENOUS | Status: AC
  Administered 2019-01-04: 16:00:00 via INTRAVENOUS

## 2019-01-04 MED ORDER — SODIUM CHLORIDE 0.9 % IV SOLN
1.0000 g | Freq: Three times a day (TID) | INTRAVENOUS | Status: DC
  Administered 2019-01-04 – 2019-01-07 (×7): 1 g via INTRAVENOUS
  Filled 2019-01-04 (×10): qty 1

## 2019-01-04 MED ORDER — VANCOMYCIN HCL IN DEXTROSE 1-5 GM/200ML-% IV SOLN
1000.0000 mg | Freq: Once | INTRAVENOUS | Status: AC
  Administered 2019-01-04: 18:00:00 1000 mg via INTRAVENOUS
  Filled 2019-01-04: qty 200

## 2019-01-04 MED ORDER — METRONIDAZOLE IN NACL 5-0.79 MG/ML-% IV SOLN
500.0000 mg | Freq: Three times a day (TID) | INTRAVENOUS | Status: DC
  Administered 2019-01-05 – 2019-01-07 (×6): 500 mg via INTRAVENOUS
  Filled 2019-01-04 (×6): qty 100

## 2019-01-04 MED ORDER — VANCOMYCIN HCL IN DEXTROSE 750-5 MG/150ML-% IV SOLN
750.0000 mg | INTRAVENOUS | Status: DC
  Administered 2019-01-06: 750 mg via INTRAVENOUS
  Filled 2019-01-04: qty 150

## 2019-01-04 MED ORDER — CIPROFLOXACIN HCL 500 MG PO TABS
500.0000 mg | ORAL_TABLET | Freq: Every day | ORAL | Status: DC
  Administered 2019-01-04: 15:00:00 500 mg via ORAL
  Filled 2019-01-04: qty 1

## 2019-01-04 NOTE — Significant Event (Signed)
Rapid Response Event Note  Overview: Time Called: 1630 Arrival Time: 1720 Event Type: Hypotension  Initial Focused Assessment: Patient with confusion, hallucinations and some lethargy today.  Per notes concern for UTI  BP 100-118/50-60s earlier today Cipro PO given at 1500 1600 BP 82/46  HR 79  RR 20 Upon my arrival she is alert but confused and some visual hallucinations BP 91/33  HR 81  RR 21  O2 sat 99% on 3L [place] Productive Cough, sent for culture Rhonchi clear  Post cough SQ air left chest and arm Patient denies pain unless she is coughing  Interventions: 500 cc NS bolus infused  BP improved 98/53  106/60  HR 79   Plan of Care (if not transferred): RN to call if she becomes hypotensive again or if she has a worsening mental status  Event Summary: Name of Physician Notified: Nicholes Rough at 1615    at    Outcome: Stayed in room and stabalized  Event End Time: [place]  Raliegh Ip

## 2019-01-04 NOTE — Progress Notes (Signed)
Ashley [name] has not voided for more than 8 hours. Bladder scan showed 640 cc. Harriet Pho, [place] notified and order received to perform in and out cath. Pt's peri care was performed and cath was performed using a sterile technique.  800 cc was out. Pt tolerated well.

## 2019-01-04 NOTE — Progress Notes (Signed)
Pharmacy Antibiotic Note  Ashley [name] is a 68 y.o. 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 Pharmacy has been consulted for vancomycin and aztreonam dosing for sepsis.  She is also on Flagyl.  SCr 1.86, CrCL 22 ml/min, afebrile, WBC 12.9  Plan: D/C Cipro Vanc 1gm IV x 1, then 750mg  IV Q48H for AUC 432n using SCr 1.86 Azactam 1gm IV Q8H Flagyl 500mg  IV Q8H per MD Monitor renal fxn, clinical progress, vanc AUC as indicated  Height: 5\' 2"  (157.5 cm) Weight: 113 lb 12.1 oz (51.6 kg) IBW/kg (Calculated) : 50.1  Temp (24hrs), Avg:98.1 F (36.7 C), Min:97.7 F (36.5 C), Max:98.8 F (37.1 C)  Recent Labs  Lab 12/31/18 0540 01/01/19 0421 01/02/19 0424 01/03/19 0221 01/04/19 0234  WBC 10.5 7.6  --  8.4 12.9*  CREATININE 1.08* 1.30* 1.55* 1.39* 1.86*    Estimated Creatinine Clearance: 22.3 mL/min (A) (by C-G formula based on SCr of 1.86 mg/dL (H)).    Allergies  Allergen Reactions  . Penicillins Hives and Rash    Did it involve swelling of the face/tongue/throat, SOB, or low BP? No Did it involve sudden or severe rash/hives, skin peeling, or any reaction on the inside of your mouth or nose? No Did you need to seek medical attention at a hospital or doctor's office? No When did it last happen?5-10 year If all above answers are "NO", may proceed with cephalosporin use.    . Tape Rash    Medipore, Coban, and paper tape CAN be tolerated    [name] D. Mina Marble, PharmD, BCPS, [place] 01/04/2019, 5:25 PM

## 2019-01-04 NOTE — Progress Notes (Signed)
      [address]       [place] [address]             [phone]       Hypotensive receiving a fluid bolus. BP 82/46 WBC increased to 12.9k on morning labs. Significant change in patient's status. Will reorder Blood culture and add sputum cultures. Urine culture pending. Will consult pharmacy for empiric Vanc and Zosyn per pharmacy. Discussed with Dr. Orvan Seen.  Transfer to ICU   Nicholes Rough, PA-C

## 2019-01-04 NOTE — Progress Notes (Addendum)
      [address]       [place] [address]             [phone]      5 Days Post-Op Procedure(s) (LRB): VIDEO BRONCHOSCOPY (N/A) VIDEO ASSISTED THORACOSCOPY (VATS)/RIGHT LOWER LOBE WEDGE RESECTION, RIGHT UPPER LOBECTOMY (Right) Lymph Node Dissection (Right) Subjective: Sleepy and lethargic this morning. Not oriented to person or place. Would not cough so I could evaluate if she has an air leak.   Objective: Vital signs in last 24 hours: Temp:  [97.7 F (36.5 C)-98.2 F (36.8 C)] 97.8 F (36.6 C) (09/20 0700) Pulse Rate:  [65-94] 94 (09/20 0700) Cardiac Rhythm: Sinus tachycardia (09/20 0700) Resp:  [11-21] 12 (09/20 0700) BP: (92-110)/(45-85) 97/85 (09/20 0700) SpO2:  [89 %-99 %] 96 % (09/20 0817) FiO2 (%):  [31 %] 31 % (09/20 0046)     Intake/Output from previous day: 09/19 0701 - 09/20 0700 In: 240 [P.O.:240] Out: 210 [Chest Tube:210] Intake/Output this shift: No intake/output data recorded.  General appearance: cooperative and no distress Heart: regular rate and rhythm, S1, S2 normal, no murmur, click, rub or gallop Lungs: clear to auscultation bilaterally Abdomen: distended and tender Extremities: extremities normal, atraumatic, no cyanosis or edema Wound: clean and dry  Lab Results: Recent Labs    01/03/19 0221 01/04/19 0234  WBC 8.4 12.9*  HGB 7.9* 8.2*  HCT 24.9* 24.8*  PLT 242 306   BMET:  Recent Labs    01/03/19 0221 01/04/19 0234  NA 134* 136  K 4.2 4.2  CL 106 106  CO2 17* 22  GLUCOSE 85 110*  BUN 20 27*  CREATININE 1.39* 1.86*  CALCIUM 7.7* 8.1*    PT/INR: No results for input(s): LABPROT, INR in the last 72 hours. ABG    Component Value Date/Time   PHART 7.447 12/26/2018 1352   HCO3 25.2 12/26/2018 1352   TCO2 27 10/01/2017 1441   ACIDBASEDEF 1.0 10/03/2016 1137   O2SAT 94.3 12/26/2018 1352   CBG (last 3)  No results for input(s): GLUCAP in the last 72 hours.  Assessment/Plan: S/P Procedure(s) (LRB):  VIDEO BRONCHOSCOPY (N/A) VIDEO ASSISTED THORACOSCOPY (VATS)/RIGHT LOWER LOBE WEDGE RESECTION, RIGHT UPPER LOBECTOMY (Right) Lymph Node Dissection (Right)  1. CV-NSR in the 80s, BP well controlled.  2. Pulm- On 4L [place] but  was not in her nose when I entered the room. Oxygen saturation has been okay. Venturi Mask overnight.  CXR showed: No residual right pneumothorax.  No other change from the previous day's study. Stable right chest tube. Persistent right subcutaneous emphysema.  3. Acute blood loss anemia-8.2/24.8, improved. 4. Blood glucose well controlled 5. ID- no recent fevers. WBC 8.4. No growth so far on BC x 2, Afebrile.  6. Renal-creatinine 1.86, BUN 27  Plan: Bladder scan yielded over 600cc of urine. Will in and out cath this morning. She did urinate last night but only once. Encouraged to drink fluids today. If she does not drink may need to start IV fluids. UA/Urine culture sent. BMET in the AM to re-evaluate. Keep chest tube.    LOS: 5 days    Elgie Collard 01/04/2019

## 2019-01-04 NOTE — Progress Notes (Signed)
Rec'd call from RRT earlier regarding Ms Ashley [name].  BP's better after fluid bolus.  Per Dr Orvan Seen, cancel transfer to ICU.  Communicated this to [place], Therapist, sports on [name].    Karsten Ro, RN

## 2019-01-05 ENCOUNTER — Inpatient Hospital Stay (HOSPITAL_COMMUNITY): Payer: Medicare Other

## 2019-01-05 LAB — CBC
HCT: 22.1 % — ABNORMAL LOW (ref 36.0–46.0)
Hemoglobin: 7.5 g/dL — ABNORMAL LOW (ref 12.0–15.0)
MCH: 36.8 pg — ABNORMAL HIGH (ref 26.0–34.0)
MCHC: 33.9 g/dL (ref 30.0–36.0)
MCV: 108.3 fL — ABNORMAL HIGH (ref 80.0–100.0)
Platelets: 336 10*3/uL (ref 150–400)
RBC: 2.04 MIL/uL — ABNORMAL LOW (ref 3.87–5.11)
RDW: 16 % — ABNORMAL HIGH (ref 11.5–15.5)
WBC: 11.5 10*3/uL — ABNORMAL HIGH (ref 4.0–10.5)
nRBC: 0.2 % (ref 0.0–0.2)

## 2019-01-05 LAB — BASIC METABOLIC PANEL
Anion gap: 8 (ref 5–15)
BUN: 20 mg/dL (ref 8–23)
CO2: 20 mmol/L — ABNORMAL LOW (ref 22–32)
Calcium: 7.6 mg/dL — ABNORMAL LOW (ref 8.9–10.3)
Chloride: 110 mmol/L (ref 98–111)
Creatinine, Ser: 1.6 mg/dL — ABNORMAL HIGH (ref 0.44–1.00)
GFR calc Af Amer: 37 mL/min — ABNORMAL LOW (ref >60)
GFR calc non Af Amer: 32 mL/min — ABNORMAL LOW (ref >60)
Glucose, Bld: 104 mg/dL — ABNORMAL HIGH (ref 70–99)
Potassium: 3.8 mmol/L (ref 3.5–5.1)
Sodium: 138 mmol/L (ref 135–145)

## 2019-01-05 LAB — EXPECTORATED SPUTUM ASSESSMENT W GRAM STAIN, RFLX TO RESP C

## 2019-01-05 NOTE — Progress Notes (Signed)
      [address]       [place] [address]             [phone]       6 Days Post-Op Procedure(s) (LRB): VIDEO BRONCHOSCOPY (N/A) VIDEO ASSISTED THORACOSCOPY (VATS)/RIGHT LOWER LOBE WEDGE RESECTION, RIGHT UPPER LOBECTOMY (Right) Lymph Node Dissection (Right)  Subjective:  She is somewhat sleepy but arousable.  Objective: Vital signs in last 24 hours: Temp:  [97.5 F (36.4 C)-98.8 F (37.1 C)] 97.5 F (36.4 C) (09/21 0438) Pulse Rate:  [63-120] 101 (09/21 0438) Cardiac Rhythm: Sinus tachycardia (09/21 0300) Resp:  [12-31] 22 (09/21 0438) BP: (76-118)/(33-74) 108/69 (09/21 0438) SpO2:  [77 %-100 %] 93 % (09/21 0438)     Intake/Output from previous day: 09/20 0701 - 09/21 0700 In: 800 [I.V.:500; IV Piggyback:300] Out: 1150 [Urine:900; Chest Tube:250]   Physical Exam:  Cardiovascular: RRR Pulmonary: Rhonchi this am. Subcutaneous emphysema right arm and chest Abdomen: Soft, non tender, bowel sounds present. Extremities: SCDs in place Wounds: Clean and dry.  No erythema or signs of infection. Chest Tube: to water seal, tidling but no air leak  Lab Results: CBC: Recent Labs    01/03/19 0221 01/04/19 0234  WBC 8.4 12.9*  HGB 7.9* 8.2*  HCT 24.9* 24.8*  PLT 242 306   BMET:  Recent Labs    01/03/19 0221 01/04/19 0234  NA 134* 136  K 4.2 4.2  CL 106 106  CO2 17* 22  GLUCOSE 85 110*  BUN 20 27*  CREATININE 1.39* 1.86*  CALCIUM 7.7* 8.1*    PT/INR: No results for input(s): LABPROT, INR in the last 72 hours. ABG:  INR: Will add last result for INR, ABG once components are confirmed Will add last 4 CBG results once components are confirmed  Assessment/Plan:  1. CV - Slightly tachycardic with HR in the low 100's. 2.  Pulmonary - On 2-2.5 liters of oxygen via [place]. Chest tube  With 250 cc of output last 24 hours. Chest tube is to water seal and there is tidling but no air leak.No CXR ordered for this am. Encourage incentive spirometer.  Await final pathology for LN staging. Respiratory culture showed rare yeast. 3.  ABL anemia-H and H yesterday stable at 8.2 and 24.8. Await this am's result. 4. Creatinine yesterday 1.86. Await this am's result 5. ID-on Aztreonam, Vancomycin, and Flagyl. She had previous fever to 102.7. WBC yesterday 12,900. Await blood and urine cultures. 6. Urinary retention-required in and out cath yesterday. Will bladder scan in several hours and re insert foley if unable to void   M ZimmermanPA-C 01/05/2019,7:36 AM [phone]

## 2019-01-05 NOTE — Plan of Care (Signed)
  Problem: Clinical Measurements: Goal: Ability to maintain clinical measurements within normal limits will improve Outcome: Progressing Goal: Postoperative complications will be avoided or minimized Outcome: Progressing   Problem: Skin Integrity: Goal: Demonstration of wound healing without infection will improve Outcome: Progressing   Problem: Clinical Measurements: Goal: Respiratory complications will improve Outcome: Progressing   Problem: Nutrition: Goal: Adequate nutrition will be maintained Outcome: Progressing   Problem: Pain Managment: Goal: General experience of comfort will improve Outcome: Progressing   Problem: Safety: Goal: Ability to remain free from injury will improve Outcome: Progressing   Problem: Skin Integrity: Goal: Risk for impaired skin integrity will decrease Outcome: Progressing

## 2019-01-05 NOTE — Progress Notes (Signed)
     [address]       [place] [address]             [phone]       HD lability over the weekend  Vitals:   01/05/19 0300 01/05/19 0438  BP:  108/69  Pulse: (!) 103 (!) 101  Resp: 20 (!) 22  Temp:  (!) 97.5 F (36.4 C)  SpO2: 91% 93%   arousable Tachy, sinus Coarse BS.  No leak with cough Incision clean  Labs pending  POD 6 s/p R VATS, RUL Will likley remove chest once more awake Need aggressive pulm toilet.

## 2019-01-06 ENCOUNTER — Inpatient Hospital Stay (HOSPITAL_COMMUNITY): Payer: Medicare Other

## 2019-01-06 LAB — BASIC METABOLIC PANEL
Anion gap: 7 (ref 5–15)
BUN: 20 mg/dL (ref 8–23)
CO2: 20 mmol/L — ABNORMAL LOW (ref 22–32)
Calcium: 8 mg/dL — ABNORMAL LOW (ref 8.9–10.3)
Chloride: 110 mmol/L (ref 98–111)
Creatinine, Ser: 1.56 mg/dL — ABNORMAL HIGH (ref 0.44–1.00)
GFR calc Af Amer: 39 mL/min — ABNORMAL LOW (ref >60)
GFR calc non Af Amer: 33 mL/min — ABNORMAL LOW (ref >60)
Glucose, Bld: 96 mg/dL (ref 70–99)
Potassium: 3.7 mmol/L (ref 3.5–5.1)
Sodium: 137 mmol/L (ref 135–145)

## 2019-01-06 LAB — GLUCOSE, CAPILLARY
Glucose-Capillary: 65 mg/dL — ABNORMAL LOW (ref 70–99)
Glucose-Capillary: 83 mg/dL (ref 70–99)

## 2019-01-06 NOTE — Plan of Care (Signed)

## 2019-01-06 NOTE — Progress Notes (Signed)
Green Mittens placed on patient to prevent her from removing telemetry and nasal cannula. Patient stated she did feel better after she had something to eat. Arthor Captain LPN

## 2019-01-06 NOTE — Progress Notes (Signed)
Hypoglycemic Event  CBG: 65  Treatment: OJ and graham crackers and peanut butter  Symptoms: confusion  Follow-up CBG: Time:0310CBG Result:83  Possible Reasons for Event: unknown  Comments/MD notified:no    Owens Shark, [place]

## 2019-01-06 NOTE — Progress Notes (Signed)
Patient became increasing confused pulling off telemetry and oxygen. At 0250 checked CBG 65 given Orange Juice and graham crackers. Will continue to monitor. Arthor Captain LPN

## 2019-01-06 NOTE — Progress Notes (Addendum)
      [address]       [place] [address]             [phone]       7 Days Post-Op Procedure(s) (LRB): VIDEO BRONCHOSCOPY (N/A) VIDEO ASSISTED THORACOSCOPY (VATS)/RIGHT LOWER LOBE WEDGE RESECTION, RIGHT UPPER LOBECTOMY (Right) Lymph Node Dissection (Right)  Subjective: She became confused earlier this am, secondary to hypoglycemia. She is somewhat sleepy again this am but arousable.  Objective: Vital signs in last 24 hours: Temp:  [97.8 F (36.6 C)-101.2 F (38.4 C)] 97.8 F (36.6 C) (09/22 0500) Pulse Rate:  [71-98] 71 (09/22 0500) Cardiac Rhythm: Normal sinus rhythm (09/21 1929) Resp:  [15-22] 15 (09/22 0500) BP: (85-109)/(55-66) 90/58 (09/22 0500) SpO2:  [94 %-100 %] 98 % (09/22 0500)     Intake/Output from previous day: 09/21 0701 - 09/22 0700 In: 587.1 [I.V.:120; IV Piggyback:467.1] Out: 700 [Urine:600; Chest Tube:100]   Physical Exam:  Cardiovascular: RRR Pulmonary: Rhonchi this am. Subcutaneous emphysema right arm and chest.  Abdomen: Soft, non tender, bowel sounds present. Extremities: SCDs in place Wounds: Clean and dry.  No erythema or signs of infection. Chest Tube: to water seal, intermittent small air leak  Lab Results: CBC: Recent Labs    01/04/19 0234 01/05/19 0726  WBC 12.9* 11.5*  HGB 8.2* 7.5*  HCT 24.8* 22.1*  PLT 306 336   BMET:  Recent Labs    01/05/19 0726 01/06/19 0225  NA 138 137  K 3.8 3.7  CL 110 110  CO2 20* 20*  GLUCOSE 104* 96  BUN 20 20  CREATININE 1.60* 1.56*  CALCIUM 7.6* 8.0*    PT/INR: No results for input(s): LABPROT, INR in the last 72 hours. ABG:  INR: Will add last result for INR, ABG once components are confirmed Will add last 4 CBG results once components are confirmed  Assessment/Plan:  1. CV - SR this am. Has been hypotensive last few days, but BP slowly improving (given fluids and Amlodipine stopped). 2.  Pulmonary - On 3 liters of oxygen via [place]. Chest tube  With 100 cc of  output last 24 hours. Chest tube is to water seal and there is an intermittent,small air leak. CXR this am appears to show a right pneumothorax and consolidation right mid lung. Dr. Kipp Brood to determine when to remove chest tube. On Mucinex for cough. Encourage incentive spirometer. Await final pathology for LN staging. Respiratory culture showed rare yeast. 3.  ABL anemia-H and H decreased to 7.5 and 22.1.  4. Creatinine this am down to 1.56.  5. ID-on Aztreonam, Vancomycin, and Flagyl. She had previous fever to 102.7. WBC yesterday 12,900.  Blood culture show no growth thus far. Urine culture not obtained yet.   Donielle M ZimmermanPA-C 01/06/2019,7:50 AM [phone]   CT removed Repeat later [place]

## 2019-01-06 NOTE — Progress Notes (Signed)
Patient use the bedside commode with 2 person assist when she was placed back into bed noticed that nasal cannula was off and O2 stats were in 60% CMT called I informed that I was in the room. RT was notified and OS increased to liters. When RT arrived O2 was 100% and O2 was checked with RT pulse OX and 97% O2 was decrease to 4l nasal cannula and patient was informed that she is not to get OOB. New pulse ox probe was placed will continue to monitor. Arthor Captain LPN

## 2019-01-06 NOTE — Evaluation (Signed)
Physical Therapy Evaluation Patient Details Name: Ashley [name] MRN: [id number] DOB: 05-15-1950 Today's Date: 01/06/2019   History of Present Illness  68 yo admitted with lung CA s/p VATS with resection and lobectomy. PMhx: breast CA s/p mastectomy, CAD, HTN, CHF, COPD, HLD, scoliosis s/p PLIF and ACDF  Clinical Impression  Pt in recliner on arrival partially sliding out to edge with lack of awareness, safety and lethargy this session. Per RN and daughter pt normally independent and able to walk in hall with RW 9/21 with change in cognition that night. Pt with desaturation to 79% on RA with pivot to [facility] with return to 2L pt maintaining 95%. Pt with decreased cognition, balance, transfers and gait who will benefit from acute therapy to maximize mobility, safety and function. Daughter Ashley [name] reports she works from home and family can provide 24hr assist. She also states pt gets confused and has wobbly legs when pain not controlled after surgery and isn't eating.     Follow Up Recommendations Home health PT;Supervision/Assistance - 24 hour    Equipment Recommendations  None recommended by PT    Recommendations for Other Services OT consult     Precautions / Restrictions Precautions Precautions: Fall Precaution Comments: per daughter legs will intermittently give out since back sx. chest tube      Mobility  Bed Mobility Overal bed mobility: Needs Assistance Bed Mobility: Sit to Supine       Sit to supine: Min assist   General bed mobility comments: min assist to lift legs to surface, cues for sequence and safety, assist for line management  Transfers Overall transfer level: Needs assistance   Transfers: Sit to/from Stand;Stand Pivot Transfers Sit to Stand: Min assist Stand pivot transfers: Min assist       General transfer comment: cues for hand placement, safety and line management to transfer from recliner to [facility] with RW, back to recliner due to legs partially buckling then to  bed  Ambulation/Gait Ambulation/Gait assistance: Mod assist Gait Distance (Feet): 20 Feet Assistive device: Rolling walker (2 wheeled) Gait Pattern/deviations: Step-through pattern;Decreased stride length;Narrow base of support   Gait velocity interpretation: <1.8 ft/sec, indicate of risk for recurrent falls General Gait Details: pt with unsteady gait with legs wobbling and pt needing assist to control RW and balance with gait limited by fatigue, balance and MD present to remove CT  Stairs            Wheelchair Mobility    Modified Rankin (Stroke Patients Only)       Balance Overall balance assessment: Needs assistance   Sitting balance-Leahy Scale: Fair Sitting balance - Comments: pt able to sit at edge of chair, BSC   Standing balance support: Bilateral upper extremity supported Standing balance-Leahy Scale: Poor Standing balance comment: bil UE support on RW                             Pertinent Vitals/Pain Pain Assessment: 0-10 Pain Score: 7  Pain Location: right chest Pain Descriptors / Indicators: Aching Pain Intervention(s): Limited activity within patient's tolerance;Monitored during session;Patient requesting pain meds-RN notified;Repositioned    Home Living Family/patient expects to be discharged to:: Private residence Living Arrangements: Children Available Help at Discharge: Family;Available 24 hours/day Type of Home: House Home Access: Stairs to enter   [employer] of Steps: 1 Home Layout: One level Home Equipment: Walker - 2 wheels;Shower seat      Prior Function Level of Independence: Independent  Comments: daughter reports she helps her up at night if she falls asleep in chair because legs will give out but otherwise independent     Hand Dominance        Extremity/Trunk Assessment   Upper Extremity Assessment Upper Extremity Assessment: Generalized weakness    Lower Extremity Assessment Lower  Extremity Assessment: Generalized weakness    Cervical / Trunk Assessment Cervical / Trunk Assessment: Kyphotic  Communication   Communication: No difficulties  Cognition Arousal/Alertness: Lethargic Behavior During Therapy: Flat affect;Impulsive Overall Cognitive Status: Impaired/Different from baseline Area of Impairment: Orientation;Attention;Memory;Following commands;Safety/judgement;Problem solving                 Orientation Level: Disoriented to;Time Current Attention Level: Focused Memory: Decreased short-term memory Following Commands: Follows one step commands inconsistently Safety/Judgement: Decreased awareness of safety;Decreased awareness of deficits   Problem Solving: Slow processing;Decreased initiation General Comments: pt lethargic throughout session with ability to respond to questions with delay at times. Pt impulsive trying to get to [facility] prior to lines moved and unsteady on feet with partial buckling. Daughter Ashley [name] present to provide PLOF and baseline cognition and reports pt becomes like this after surgery when pain not controlled      General Comments      Exercises     Assessment/Plan    PT Assessment Patient needs continued PT services  PT Problem List Decreased activity tolerance;Decreased balance;Decreased knowledge of use of DME;Decreased cognition;Cardiopulmonary status limiting activity;Decreased strength;Decreased mobility;Decreased safety awareness       PT Treatment Interventions Gait training;Therapeutic exercise;Patient/family education;DME instruction;Therapeutic activities;Cognitive remediation;Stair training;Balance training;Functional mobility training    PT Goals (Current goals can be found in the Care Plan section)  Acute Rehab PT Goals Patient Stated Goal: return home PT Goal Formulation: With patient/family Time For Goal Achievement: 01/20/19 Potential to Achieve Goals: Fair    Frequency Min 3X/week   Barriers to  discharge        Co-evaluation               AM-PAC PT "6 Clicks" Mobility  Outcome Measure Help needed turning from your back to your side while in a flat bed without using bedrails?: A Little Help needed moving from lying on your back to sitting on the side of a flat bed without using bedrails?: A Little Help needed moving to and from a bed to a chair (including a wheelchair)?: A Little Help needed standing up from a chair using your arms (e.g., wheelchair or bedside chair)?: A Little Help needed to walk in hospital room?: A Lot Help needed climbing 3-5 steps with a railing? : A Lot 6 Click Score: 16    End of Session Equipment Utilized During Treatment: Oxygen Activity Tolerance: Patient limited by lethargy Patient left: in bed;with call bell/phone within reach;with family/visitor present;with nursing/sitter in room Nurse Communication: Mobility status PT Visit Diagnosis: Muscle weakness (generalized) (M62.81);Other abnormalities of gait and mobility (R26.89);Unsteadiness on feet (R26.81)    Time: 2595-6387 PT Time Calculation (min) (ACUTE ONLY): 27 min   Charges:   PT Evaluation $PT Eval Moderate Complexity: 1 Mod PT Treatments $Therapeutic Activity: 8-22 mins        Tomislav Micale Pam Drown, PT Acute Rehabilitation Services Pager: [phone] Office: [phone]   Sandy Salaam Tyrhonda Georgiades 01/06/2019, 12:38 PM

## 2019-01-07 ENCOUNTER — Inpatient Hospital Stay (HOSPITAL_COMMUNITY): Payer: Medicare Other

## 2019-01-07 ENCOUNTER — Encounter (HOSPITAL_COMMUNITY): Payer: Medicare Other

## 2019-01-07 ENCOUNTER — Other Ambulatory Visit (HOSPITAL_COMMUNITY): Payer: Self-pay | Admitting: *Deleted

## 2019-01-07 DIAGNOSIS — C3491 Malignant neoplasm of unspecified part of right bronchus or lung: Secondary | ICD-10-CM

## 2019-01-07 DIAGNOSIS — C3490 Malignant neoplasm of unspecified part of unspecified bronchus or lung: Secondary | ICD-10-CM

## 2019-01-07 LAB — CULTURE, BLOOD (ROUTINE X 2)
Culture: NO GROWTH
Culture: NO GROWTH
Special Requests: ADEQUATE
Special Requests: ADEQUATE

## 2019-01-07 LAB — BASIC METABOLIC PANEL
Anion gap: 8 (ref 5–15)
BUN: 14 mg/dL (ref 8–23)
CO2: 20 mmol/L — ABNORMAL LOW (ref 22–32)
Calcium: 8.1 mg/dL — ABNORMAL LOW (ref 8.9–10.3)
Chloride: 109 mmol/L (ref 98–111)
Creatinine, Ser: 1.52 mg/dL — ABNORMAL HIGH (ref 0.44–1.00)
GFR calc Af Amer: 40 mL/min — ABNORMAL LOW (ref >60)
GFR calc non Af Amer: 34 mL/min — ABNORMAL LOW (ref >60)
Glucose, Bld: 100 mg/dL — ABNORMAL HIGH (ref 70–99)
Potassium: 2.8 mmol/L — ABNORMAL LOW (ref 3.5–5.1)
Sodium: 137 mmol/L (ref 135–145)

## 2019-01-07 LAB — CBC
HCT: 22 % — ABNORMAL LOW (ref 36.0–46.0)
Hemoglobin: 7.5 g/dL — ABNORMAL LOW (ref 12.0–15.0)
MCH: 36.2 pg — ABNORMAL HIGH (ref 26.0–34.0)
MCHC: 34.1 g/dL (ref 30.0–36.0)
MCV: 106.3 fL — ABNORMAL HIGH (ref 80.0–100.0)
Platelets: 407 10*3/uL — ABNORMAL HIGH (ref 150–400)
RBC: 2.07 MIL/uL — ABNORMAL LOW (ref 3.87–5.11)
RDW: 15.9 % — ABNORMAL HIGH (ref 11.5–15.5)
WBC: 8.7 10*3/uL (ref 4.0–10.5)
nRBC: 0 % (ref 0.0–0.2)

## 2019-01-07 MED ORDER — AMLODIPINE BESYLATE 2.5 MG PO TABS: 2.5000 mg | ORAL_TABLET | Freq: Every day | ORAL | Status: DC

## 2019-01-07 MED ORDER — CLONAZEPAM 0.5 MG PO TABS: 0.5000 mg | ORAL_TABLET | Freq: Every day | ORAL | 2 refills | Status: DC

## 2019-01-07 MED ORDER — GUAIFENESIN ER 600 MG PO TB12: 600.0000 mg | ORAL_TABLET | Freq: Two times a day (BID) | ORAL | Status: AC | PRN

## 2019-01-07 MED ORDER — HYDROXYUREA 500 MG PO CAPS: ORAL_CAPSULE | ORAL | 4 refills | Status: DC

## 2019-01-07 MED ORDER — OXYCODONE HCL 5 MG PO TABS: 5.0000 mg | ORAL_TABLET | ORAL | 0 refills | Status: DC | PRN

## 2019-01-07 MED ORDER — POTASSIUM CHLORIDE CRYS ER 20 MEQ PO TBCR
40.0000 meq | EXTENDED_RELEASE_TABLET | Freq: Once | ORAL | Status: AC
  Administered 2019-01-07: 40 meq via ORAL
  Filled 2019-01-07: qty 2

## 2019-01-07 NOTE — Discharge Instructions (Signed)
Please contact the physician who prescribed Hydroxyurea as last creatinine, although improved, was 1.52  Discharge Instructions:  1. You may shower, please wash incisions daily with soap and water and keep dry.  If you wish to cover wounds with dressing you may do so but please keep clean and change daily.  No tub baths or swimming until incisions have completely healed.  If your incisions become red or develop any drainage please call our office at [phone]  2. No Driving until cleared by Dr. Abran Duke office and you are no longer using narcotic pain medications  3. Fever of 101.5 for at least 24 hours with no source, please contact our office at [phone]  4. Activity- up as tolerated, please walk at least 3 times per day.  Avoid strenuous activity, no lifting, pushing, or pulling with your arms over 8-10 lbs for a minimum of 6 weeks   Thoracoscopy, Care After This sheet gives you information about how to care for yourself after your procedure. Your health care provider may also give you more specific instructions. If you have problems or questions, contact your health care provider. What can I expect after the procedure? After the procedure, it is common to have pain and soreness in the surgical area. Follow these instructions at home: Incision care   Follow instructions from your health care provider about how to take care of your incision. Make sure you: ? Wash your hands with soap and water before you change your bandage (dressing). If soap and water are not available, use hand sanitizer. ? Change your dressing as told by your health care provider. ? Leave stitches (sutures), skin glue, or adhesive strips in place. These skin closures may need to stay in place for 2 weeks or longer. If adhesive strip edges start to loosen and curl up, you may trim the loose edges. Do not remove adhesive strips completely unless your health care provider tells you to do that.  Check your  incision areas every day for signs of infection. Check for: ? Redness, swelling, or pain. ? Fluid or blood. ? Warmth. ? Pus or a bad smell.  Do not take baths, swim, or use a hot tub until your health care provider approves. You may take showers. Medicines  Take over-the-counter and prescription medicines only as told by your health care provider.  If you were prescribed an antibiotic medicine, take it as told by your health care provider. Do not stop taking the antibiotic even if you start to feel better.  Do not drive or use heavy machinery while taking prescription pain medicine.  If you are taking prescription pain medicine, take actions to prevent or treat constipation. Your health care provider may recommend that you: ? Drink enough fluid to keep your urine pale yellow. ? Eat foods that are high in fiber, such as fresh fruits and vegetables, whole grains, and beans. ? Limit foods that are high in fat and processed sugars, such as fried and sweet foods. ? Take an over-the-counter or prescription medicine for constipation. Managing pain, stiffness, and swelling   If directed, put ice on the affected area: ? Put ice in a plastic bag. ? Place a towel between your skin and the bag. ? Leave the ice on for 20 minutes, 2-3 times a day. Preventing lung infection  To prevent pneumonia and to keep your lungs healthy: ? Try to cough often. If it hurts to cough, hold a pillow against your chest as you cough. ? Take  deep breaths or do breathing exercises as instructed by your health care provider. ? If you were given an incentive spirometer, use it as directed by your health care provider. General instructions  Do not lift anything that is heavier than 10 lb (4.5 kg), or the limit that you are told, until your health care provider says that it is safe.  Do not use any products that contain nicotine or tobacco, such as cigarettes and e-cigarettes. These can delay healing after surgery. If  you need help quitting, ask your health care provider.  Avoid driving until your health care provider approves.  If you have a chest drainage tube, care for it as instructed by your health care provider. Do not travel by airplane after the chest drainage tube is removed until your health care provider approves.  Keep all follow-up visits as told by your health care provider. This is important. Contact a health care provider if:  You have a fever.  Pain medicines do not ease your pain.  You have redness, swelling, or increasing pain in your incision area.  You develop a cough that does not go away, or you are coughing up mucus that is yellow or green. Get help right away if:  You have fluid, blood, or pus coming from your incision.  There is a bad smell coming from your incision or dressing.  You develop a rash.  You cough up blood.  You develop light-headedness, or you feel faint.  You have difficulty breathing.  You develop chest pain.  Your heartbeat feels irregular or very fast. These symptoms may represent a serious problem that is an emergency. Do not wait to see if the symptoms will go away. Get medical help right away. Call your local emergency services (911 in the [place] Do not drive yourself to the hospital. Summary  Follow instructions from your health care provider about how to take care of your incision.  Do not drive or use heavy machinery while taking prescription pain medicine.  Leave stitches (sutures), skin glue, or adhesive strips in place.  Check your incision areas every day for signs of infection. This information is not intended to replace advice given to you by your health care provider. Make sure you discuss any questions you have with your health care provider. Document Released: 10/20/2004 Document Revised: 03/15/2017 Document Reviewed: 03/12/2017 Elsevier Patient Education  2020 [employer].   5. If any questions or concerns arise, please  do not hesitate to contact our office at [phone]

## 2019-01-07 NOTE — TOC Transition Note (Addendum)
Transition of Care [facility]) - CM/SW Discharge Note   Patient Details  Name: Ashley [name] MRN: [id number] Date of Birth: 1950/12/06  Transition of Care [facility]) CM/SW Contact:  Zenon Mayo, RN Phone Number: 01/07/2019, 12:56 PM   Clinical Narrative:    NCM offered choice to patient and daughter for HHPT, they chose [facility].  Referral given to them and faxed over to [phone].  Awaiting to hear back from them if they can take referral.  Also daughter states she would like to get oxygen thru [place].  NCM contacted [employer], will need to fax information to [phone].  NCM awaiting sats from staff RN.  NCM faxed information to [facility]. They will have to bring a oxygen tank to hospital for patient to go home with. Staff RN says she needs it continously.  Also received call from [facility] and they state they can take referral for HHPT.    Final next level of care: [place] Barriers to Discharge: No Barriers Identified   Patient Goals and CMS Choice Patient states their goals for this hospitalization and ongoing recovery are:: get better CMS Medicare.gov Compare Post Acute Care list provided to:: Patient Represenative (must comment)(daughter) Choice offered to / list presented to : Adult Children  Discharge Placement                       Discharge Plan and Services                DME Arranged: Oxygen DME Agency: [facility] Date DME Agency Contacted: 01/07/19 Time DME Agency Contacted: 1250 Representative spoke with at DME Agency: [place]: PT [place]: [place] Date [place]: 01/07/19 Time [place]: [place] Representative spoke with at [place]: Amy  Social Determinants of Health ([place]) Interventions     Readmission Risk Interventions No flowsheet data found.

## 2019-01-07 NOTE — Progress Notes (Signed)
Pharmacy Antibiotic Note  Ashley [name] is a 68 y.o. 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 Pharmacy has been consulted for vancomycin and aztreonam dosing with concern for post-operative PNA. Pt started on aztreonam due to hives with PCN, but per pt report she has tolerated cephalexin before with no issues.  WBC down to wnl, blood culture remains negative, SCr stable. Discussed with TCTS PA who will verify ability to narrow with surgeon, continue current plan for now.  Plan: Aztreonam 1g IV q8h Vancomycin 750mg  IV q48h Metronidazole 500mg  IV q8h per MD  Height: 5\' 2"  (157.5 cm) Weight: 113 lb 12.1 oz (51.6 kg) IBW/kg (Calculated) : 50.1  Temp (24hrs), Avg:98.6 F (37 C), Min:98.3 F (36.8 C), Max:99.1 F (37.3 C)  Recent Labs  Lab 01/01/19 0421  01/03/19 0221 01/04/19 0234 01/05/19 0726 01/06/19 0225 01/07/19 0223  WBC 7.6  --  8.4 12.9* 11.5*  --  8.7  CREATININE 1.30*   < > 1.39* 1.86* 1.60* 1.56* 1.52*   < > = values in this interval not displayed.    Estimated Creatinine Clearance: 27.2 mL/min (A) (by C-G formula based on SCr of 1.52 mg/dL (H)).    Allergies  Allergen Reactions  . Penicillins Hives and Rash    Did it involve swelling of the face/tongue/throat, SOB, or low BP? No Did it involve sudden or severe rash/hives, skin peeling, or any reaction on the inside of your mouth or nose? No Did you need to seek medical attention at a hospital or doctor's office? No When did it last happen?5-10 year If all above answers are "NO", may proceed with cephalosporin use.    . Tape Rash    Medipore, Coban, and paper tape CAN be tolerated    Antimicrobials: Azactam 9/20 >> Vancomycin 9/20 >> Metronidazole 9/20 >>  Microbiology: 9/20 Sputum: negative 9/20 BCx: NGTD x48h   Arrie Senate, PharmD, BCPS Clinical Pharmacist [phone] Please check AMION for all [place] numbers 01/07/2019

## 2019-01-07 NOTE — TOC Transition Note (Signed)
Transition of Care [facility]) - CM/SW Discharge Note   Patient Details  Name: Ashley [name] MRN: [id number] Date of Birth: [birth year]  Transition of Care [facility]) CM/SW Contact:  Zenon Mayo, RN Phone Number: 01/07/2019, 1:41 PM   Clinical Narrative:    NCM offered choice to patient and daughter for HHPT, they chose [facility].  Referral given to them and faxed over to [phone].  Awaiting to hear back from them if they can take referral.  Also daughter states she would like to get oxygen thru [place].  NCM contacted [employer], will need to fax information to [phone].  NCM awaiting sats from staff RN.  NCM faxed information to [facility]. They will have to bring a oxygen tank to hospital for patient to go home with. Staff RN says she needs it continously.  Also received call from [facility] and they state they can take referral for HHPT.     Final next level of care: [place] Barriers to Discharge: No Barriers Identified   Patient Goals and CMS Choice Patient states their goals for this hospitalization and ongoing recovery are:: to get better CMS Medicare.gov Compare Post Acute Care list provided to:: Patient Represenative (must comment)(daughter) Choice offered to / list presented to : Adult Children  Discharge Placement                       Discharge Plan and Services                DME Arranged: Oxygen DME Agency: [facility] Date DME Agency Contacted: 01/07/19 Time DME Agency Contacted: [phone] Representative spoke with at [place]: [place]: PT [place]: [place] Date [place]: 01/07/19 Time [place]: [place] Representative spoke with at [place]: Amy  Social Determinants of Health ([place]) Interventions     Readmission Risk Interventions No flowsheet data found.

## 2019-01-07 NOTE — Progress Notes (Signed)
Patient was lethargy in the morning and encouraged to sit on the chair and having breakfast. She was more awake. Patient's daughter wanted to talk doctor and left message to Dr. Kipp Brood regarding this. Patient was going home today and she needs oxygen while ambulating. Removed PIV access and received discharge instructions. Pt's daughter understood it well. Patient took her all belongings but forgot to bring the oxygen tank. Told unit secretary to call patient to get it. HS [employer]

## 2019-01-07 NOTE — Progress Notes (Signed)
SATURATION QUALIFICATIONS: (This note is used to comply with regulatory documentation for home oxygen)  Patient Saturations on Room Air at Rest = 94%  Patient Saturations on Room Air while Ambulating = 86 %  Patient Saturations on 3 Liters of oxygen while Ambulating = 93 % Patient Saturations on Room Air at night = 87%  Patient Saturations on 2 Liters of oxygen while sleeping = 92%  Patient need to O2 while she ambulating and when she sleep at night.  HS [employer]

## 2019-01-07 NOTE — Progress Notes (Addendum)
      [address]       [place] [address]             [phone]       8 Days Post-Op Procedure(s) (LRB): VIDEO BRONCHOSCOPY (N/A) VIDEO ASSISTED THORACOSCOPY (VATS)/RIGHT LOWER LOBE WEDGE RESECTION, RIGHT UPPER LOBECTOMY (Right) Lymph Node Dissection (Right)  Subjective: She is sleepy but arousable this am.  Objective: Vital signs in last 24 hours: Temp:  [98.3 F (36.8 C)-98.6 F (37 C)] 98.4 F (36.9 C) (09/23 0419) Pulse Rate:  [83-95] 83 (09/23 0419) Cardiac Rhythm: Normal sinus rhythm (09/23 0419) Resp:  [16-26] 26 (09/23 0419) BP: (111-146)/(63-71) 146/71 (09/23 0419) SpO2:  [94 %-100 %] 95 % (09/23 0419)     Intake/Output from previous day: 09/22 0701 - 09/23 0700 In: 1519.1 [P.O.:480; I.V.:174.6; IV Piggyback:864.5] Out: 2200 [Urine:2200]   Physical Exam:  Cardiovascular: RRR Pulmonary: Rhonchi again this am. Subcutaneous emphysema right arm and chest.  Abdomen: Soft, non tender, bowel sounds present. Extremities: No LE edema Wounds: Clean and dry.  No erythema or signs of infection.  Lab Results: CBC: Recent Labs    01/05/19 0726 01/07/19 0223  WBC 11.5* 8.7  HGB 7.5* 7.5*  HCT 22.1* 22.0*  PLT 336 407*   BMET:  Recent Labs    01/06/19 0225 01/07/19 0223  NA 137 137  K 3.7 2.8*  CL 110 109  CO2 20* 20*  GLUCOSE 96 100*  BUN 20 14  CREATININE 1.56* 1.52*  CALCIUM 8.0* 8.1*    PT/INR: No results for input(s): LABPROT, INR in the last 72 hours. ABG:  INR: Will add last result for INR, ABG once components are confirmed Will add last 4 CBG results once components are confirmed  Assessment/Plan:  1. CV - SR this am.  2.  Pulmonary - On 3-4 liters of oxygen via [place].Chest tube removed yesterday. CXR this am appears stable.  On Mucinex for cough. Encourage incentive spirometer. Await final pathology for LN staging. Respiratory culture showed rare yeast. Will likely need oxygen at discharge. 3.  ABL anemia-H and H  remains  7.5 and 22..  4. Creatinine this am slightly decreased to 1.52.  5. ID-on Aztreonam, Vancomycin, and Flagyl for possible PNA. She had previous fever to 102.7. WBC decreased to 8,700 this am.  Blood culture show no growth thus far. Urine culture not obtained yet. 6. Appreciate PT's assistance. Home PT ordered as per recommendation 7. Supplement potassium  Donielle M ZimmermanPA-C 01/07/2019,7:28 AM [phone]  Agree with above CXR stable Home O2 eval due to nightly desats Home PT ordered Dispo planning

## 2019-01-08 ENCOUNTER — Inpatient Hospital Stay (HOSPITAL_COMMUNITY): Payer: Medicare Other

## 2019-01-08 ENCOUNTER — Ambulatory Visit (HOSPITAL_COMMUNITY): Payer: Medicare Other | Admitting: Hematology

## 2019-01-09 ENCOUNTER — Ambulatory Visit: Payer: Medicare Other | Admitting: Thoracic Surgery (Cardiothoracic Vascular Surgery)

## 2019-01-09 LAB — CULTURE, BLOOD (ROUTINE X 2)
Culture: NO GROWTH
Culture: NO GROWTH

## 2019-01-09 LAB — CULTURE, RESPIRATORY W GRAM STAIN

## 2019-01-14 ENCOUNTER — Telehealth: Payer: Self-pay

## 2019-01-14 ENCOUNTER — Other Ambulatory Visit: Payer: Self-pay | Admitting: Thoracic Surgery (Cardiothoracic Vascular Surgery)

## 2019-01-14 DIAGNOSIS — C3491 Malignant neoplasm of unspecified part of right bronchus or lung: Secondary | ICD-10-CM

## 2019-01-14 NOTE — Telephone Encounter (Signed)
Ashley [name] contacted the office requesting humidification to her oxygen.  She stated that her nose was so dry.  Sent in request to [place] for patient's request.  Will make Dr. Kipp Brood aware.

## 2019-01-15 ENCOUNTER — Other Ambulatory Visit: Payer: Self-pay | Admitting: *Deleted

## 2019-01-15 NOTE — Progress Notes (Signed)
The proposed treatment discussed in cancer conference 01/15/19 is for discussion purpose only and is not a binding recommendation.  The patient was not physically examined nor present for their treatment options.  Therefore, final treatment plans cannot be decided.

## 2019-01-16 ENCOUNTER — Other Ambulatory Visit: Payer: Self-pay

## 2019-01-16 ENCOUNTER — Ambulatory Visit (INDEPENDENT_AMBULATORY_CARE_PROVIDER_SITE_OTHER): Payer: Self-pay | Admitting: Thoracic Surgery (Cardiothoracic Vascular Surgery)

## 2019-01-16 ENCOUNTER — Encounter: Payer: Self-pay | Admitting: Thoracic Surgery (Cardiothoracic Vascular Surgery)

## 2019-01-16 ENCOUNTER — Ambulatory Visit
Admission: RE | Admit: 2019-01-16 | Discharge: 2019-01-16 | Disposition: A | Discharge status: Home / Self Care | Payer: Medicare Other | Source: Ambulatory Visit | Attending: Thoracic Surgery (Cardiothoracic Vascular Surgery) | Admitting: Thoracic Surgery (Cardiothoracic Vascular Surgery)

## 2019-01-16 VITALS — BP 100/69 | HR 101 | Temp 97.3°F | Resp 24 | Ht 62.0 in | Wt 103.8 lb

## 2019-01-16 DIAGNOSIS — C3491 Malignant neoplasm of unspecified part of right bronchus or lung: Secondary | ICD-10-CM

## 2019-01-16 DIAGNOSIS — Z902 Acquired absence of lung [part of]: Secondary | ICD-10-CM

## 2019-01-16 NOTE — Progress Notes (Signed)
      [address]       [place] [address]             [phone]        Ashley [name] [place] Medical Record #[id number] Date of Birth: Apr 11, 1951  Referring: Derek Jack, MD Primary Care: [name], NP Primary Cardiologist:No primary care provider on file.  Reason for visit:   follow-up  History of Present Illness:     Mrs. Ashley [name] presents for her 1st follow-up.  She is slowly doing better.  She remains on oxygen.  Her cough has improved  Physical Exam: BP 100/69   Pulse (!) 101   Temp (!) 97.3 F (36.3 C) (Skin)   Resp (!) 24   Wt 103 lb 12.8 oz (47.1 kg)   SpO2 (!) 89% Comment: 3 L O2 via [place]  BMI 18.99 kg/m   Alert NAD RRR, no murmur.  Incision clean Abdomen soft, NT/ND no peripheral edema   Diagnostic Studies & Laboratory data: CXR: trace effusion, improving interstitial process, decreased subQ emphysema Path:  A. RIGHT LOWER LOBE LUNG WEDGE RESECTION  - Benign lymph node.   B. RIGHT RIB PLEURA  - Benign fibrous tissue and lung parenchyma and bone.  - There is no evidence of malignancy.   C. LEVEL 7 LYMPH NODE  - There is no evidence of carcinoma in 1 of 1 lymph node (0/1).   D. 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   E. 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   F. RIGHT UPPER LUNG LOBECTOMY  - Invasive small cell carcinoma, 4.2 cm.  - Carcinoma involves the visceral pleura.  - Metastatic carcinoma in 1 of 3 lymph nodes (1/3).  - Lymphovascular invasion is identified.  - The surgical resection margins are negative for carcinoma.  - See oncology table below.   G. LEVEL 12 R LYMPH NODE  - There is no evidence of carcinoma in 1 of 1 lymph node (0/1).   H. RIGHT CHEST WALL BIOPSY:  - Small cell carcinoma.   Pathologic Stage Classification (pTNM, AJCC 8th Edition): at least pT2B,  pN1     Assessment / Plan:    68 yo female with stage IIb small cell lung cancer She is scheduled to meed with med and rad onc to discuss adjuvant therapy Will f/u in 1 month   Lajuana Matte 01/16/2019 2:49 PM

## 2019-01-20 ENCOUNTER — Other Ambulatory Visit: Payer: Self-pay

## 2019-01-20 ENCOUNTER — Encounter (HOSPITAL_COMMUNITY): Payer: Self-pay | Admitting: Hematology

## 2019-01-20 ENCOUNTER — Inpatient Hospital Stay (HOSPITAL_BASED_OUTPATIENT_CLINIC_OR_DEPARTMENT_OTHER): Payer: Medicare Other | Admitting: Hematology

## 2019-01-20 ENCOUNTER — Inpatient Hospital Stay (HOSPITAL_COMMUNITY): Payer: Medicare Other | Attending: Hematology

## 2019-01-20 VITALS — BP 114/70 | HR 75 | Temp 97.9°F | Resp 18 | Wt 106.2 lb

## 2019-01-20 DIAGNOSIS — Z853 Personal history of malignant neoplasm of breast: Secondary | ICD-10-CM | POA: Diagnosis not present

## 2019-01-20 DIAGNOSIS — Z85820 Personal history of malignant melanoma of skin: Secondary | ICD-10-CM | POA: Insufficient documentation

## 2019-01-20 DIAGNOSIS — C3491 Malignant neoplasm of unspecified part of right bronchus or lung: Secondary | ICD-10-CM

## 2019-01-20 DIAGNOSIS — Z9221 Personal history of antineoplastic chemotherapy: Secondary | ICD-10-CM | POA: Insufficient documentation

## 2019-01-20 DIAGNOSIS — Z90721 Acquired absence of ovaries, unilateral: Secondary | ICD-10-CM | POA: Insufficient documentation

## 2019-01-20 DIAGNOSIS — D473 Essential (hemorrhagic) thrombocythemia: Secondary | ICD-10-CM | POA: Insufficient documentation

## 2019-01-20 DIAGNOSIS — C3490 Malignant neoplasm of unspecified part of unspecified bronchus or lung: Secondary | ICD-10-CM

## 2019-01-20 LAB — COMPREHENSIVE METABOLIC PANEL
ALT: 24 U/L (ref 0–44)
AST: 40 U/L (ref 15–41)
Albumin: 2.6 g/dL — ABNORMAL LOW (ref 3.5–5.0)
Alkaline Phosphatase: 89 U/L (ref 38–126)
Anion gap: 6 (ref 5–15)
BUN: 18 mg/dL (ref 8–23)
CO2: 22 mmol/L (ref 22–32)
Calcium: 9.3 mg/dL (ref 8.9–10.3)
Chloride: 106 mmol/L (ref 98–111)
Creatinine, Ser: 1.32 mg/dL — ABNORMAL HIGH (ref 0.44–1.00)
GFR calc Af Amer: 47 mL/min — ABNORMAL LOW (ref >60)
GFR calc non Af Amer: 41 mL/min — ABNORMAL LOW (ref >60)
Glucose, Bld: 122 mg/dL — ABNORMAL HIGH (ref 70–99)
Potassium: 4.8 mmol/L (ref 3.5–5.1)
Sodium: 134 mmol/L — ABNORMAL LOW (ref 135–145)
Total Bilirubin: 0.5 mg/dL (ref 0.3–1.2)
Total Protein: 6.8 g/dL (ref 6.5–8.1)

## 2019-01-20 LAB — CBC WITH DIFFERENTIAL/PLATELET
Abs Immature Granulocytes: 0.03 10*3/uL (ref 0.00–0.07)
Basophils Absolute: 0.1 10*3/uL (ref 0.0–0.1)
Basophils Relative: 1 %
Eosinophils Absolute: 0.3 10*3/uL (ref 0.0–0.5)
Eosinophils Relative: 3 %
HCT: 29.1 % — ABNORMAL LOW (ref 36.0–46.0)
Hemoglobin: 8.9 g/dL — ABNORMAL LOW (ref 12.0–15.0)
Immature Granulocytes: 0 %
Lymphocytes Relative: 11 %
Lymphs Abs: 1 10*3/uL (ref 0.7–4.0)
MCH: 35.7 pg — ABNORMAL HIGH (ref 26.0–34.0)
MCHC: 30.6 g/dL (ref 30.0–36.0)
MCV: 116.9 fL — ABNORMAL HIGH (ref 80.0–100.0)
Monocytes Absolute: 0.8 10*3/uL (ref 0.1–1.0)
Monocytes Relative: 9 %
Neutro Abs: 6.6 10*3/uL (ref 1.7–7.7)
Neutrophils Relative %: 76 %
Platelets: 747 10*3/uL — ABNORMAL HIGH (ref 150–400)
RBC: 2.49 MIL/uL — ABNORMAL LOW (ref 3.87–5.11)
RDW: 16.9 % — ABNORMAL HIGH (ref 11.5–15.5)
WBC: 8.7 10*3/uL (ref 4.0–10.5)
nRBC: 0 % (ref 0.0–0.2)

## 2019-01-20 LAB — LACTATE DEHYDROGENASE: LDH: 197 U/L — ABNORMAL HIGH (ref 98–192)

## 2019-01-20 NOTE — Assessment & Plan Note (Signed)
1.  Small cell lung cancer: -PET scan on 10/24/2018 shows hypermetabolic right upper lobe pulmonary nodules, suspicious for synchronous primary bronchogenic carcinomas. - Core needle biopsy of the right upper lobe on 11/21/2018 consistent with small cell carcinoma. - She right upper lobectomy on 12/30/2018 showing invasive small cell carcinoma, 4.2 cm, involving visceral pleura, 1 out of 3 lymph nodes positive, lymphovascular invasion positive.  Metastatic carcinoma in 211 or lymph nodes present.  Right chest wall biopsy was consistent with small cell carcinoma. - Her case was discussed at tumor board.  Radiation therapy to the chest wall was recommended followed by systemic chemotherapy. - She will see Dr. Sondra Come this Thursday. -I have recommended restaging PET scan at this time.  I will see her back after the PET scan.  We will talk about chemotherapy at that time.  2.  Essential thrombocytosis: - She takes hydroxyurea 500 mg Tuesday through Friday and thousand milligrams on Saturday, Sunday and Monday. - Her platelet count today is elevated at 747, likely reactive from recent surgery.  3.  Left breast cancer: -Diagnosed in 1995, treated with chemotherapy. -Mammogram on 09/10/2018 with right breast was BI-RADS Category 1.  4.  Melanoma: -She had melanoma resected from left upper thigh in 2015.

## 2019-01-20 NOTE — Patient Instructions (Signed)
[place] at [facility] Discharge Instructions  You were seen today by Dr. Delton Coombes. He went over your recent test results. He will schedule you for a PET scan. He will see you back after your scans for follow up.   Thank you for choosing [place] at [facility] to provide your oncology and hematology care.  To afford each patient quality time with our provider, please arrive at least 15 minutes before your scheduled appointment time.   If you have a lab appointment with the [facility] please come in thru the  Main Entrance and check in at the main information desk  You need to re-schedule your appointment should you arrive 10 or more minutes late.  We strive to give you quality time with our providers, and arriving late affects you and other patients whose appointments are after yours.  Also, if you no show three or more times for appointments you may be dismissed from the clinic at the providers discretion.     Again, thank you for choosing [facility].  Our hope is that these requests will decrease the amount of time that you wait before being seen by our physicians.       _____________________________________________________________  Should you have questions after your visit to [facility], please contact our office at [phone] between the hours of 8:00 a.m. and 4:30 p.m.  Voicemails left after 4:00 p.m. will not be returned until the following business day.  For prescription refill requests, have your pharmacy contact our office and allow 72 hours.    Cancer Center Support Programs:   > Cancer Support Group  2nd Tuesday of the month 1pm-2pm, Journey Room

## 2019-01-20 NOTE — Progress Notes (Signed)
Ashley [name], [place] [address]   CLINIC:  Medical Oncology/Hematology  PCP:  [name], NP [address] [place] [address] [phone]   REASON FOR VISIT: Small cell lung cancer, essential thrombocytosis.  CURRENT THERAPY:Radiation followed by chemotherapy.    BRIEF ONCOLOGIC HISTORY:  Oncology History  Adenocarcinoma of left breast ([place])  09/29/1993 - 05/14/1994 Chemotherapy    AC Q 3 weeks X 6 cycles   01/02/1994 Surgery   Left modified radical mastectomy   01/02/1994 Pathology Results   ER-, PR - with a single positive LN found in the L axillae, Stage II disease   07/22/2015 Imaging   Bone Scan, No metastatic pattern uptake, degenerative changes in the lumbar spine with dextroscoliosis   05/25/2016 PET scan   No findings of active malignancy in the neck, chest, abdomen, or pelvis. The 3 liver lesions are not hypermetabolic. The radiologist suspects they may be subtly present on prior CT chest from 10/2013, to further reassuring that these are likely benign lesions.   Melanoma ([place])  07/09/2013 Initial Biopsy   Initial biopsy L upper thigh/buttocks melanoma   07/20/2013 Surgery   Excision L lateral buttock/upper thigh melanoma, clear margins   07/20/2013 Pathology Results   Breslow depth 1.02 mm, pT2a       INTERVAL HISTORY:  Ashley [name] 68 y.o. female seen for follow-up of small cell lung cancer.  She underwent resection about 3 weeks ago.  Her breathing and functional status is improving slowly.  She is doing physical therapy at home.  Low back pain is reported as 2 out of 10.  Appetite is 50%.  Energy levels are 25%.  She is accompanied by her daughter today.  Denies any fevers or chills.  REVIEW OF SYSTEMS:  Review of Systems  Respiratory: Positive for shortness of breath.   Gastrointestinal: Positive for constipation.  All other systems reviewed and are negative.    PAST MEDICAL/SURGICAL HISTORY:  Past Medical  History:  Diagnosis Date  . Adenocarcinoma of left breast ([place]) 01/09/2016  . Anginal pain ([place])   . Arthritis   . Ascending aortic aneurysm ([place])   . Cancer [facility]) 1995   breast, left, mastectomy/chemo  . Chest pain    Possibly cardiac. No evidence of ischemia/injury based upon normal troponin I. Chest discomfort could be tachycardia induced supply demand mismatch.   . CHF (congestive heart failure) ([place]) 11/17/2015   after surgery   . Colon adenomas   . Coronary artery disease   . DJD (degenerative joint disease)   . Dyspnea    with exertion  . Emphysema of lung ([place]) 11/17/2015  . Essential hypertension, benign   . GERD (gastroesophageal reflux disease)   . History of hiatal hernia   . Hyperlipidemia   . Hypertension   . Melanoma ([place]) 01/09/2016  . Osteopenia   . Palpitations   . Pernicious anemia 03/06/2016  . Pernicious anemia   . Pure hypercholesterolemia   . Raynaud's disease   . Thrombocythemia, essential ([place]) 01/09/2016  . Thrombocytosis ([facility])    Idiopathic  . Vitamin D deficiency    Past Surgical History:  Procedure Laterality Date  . ABDOMINAL HYSTERECTOMY    . ANTERIOR AND POSTERIOR REPAIR N/A 12/09/2014   Procedure: ANTERIOR (CYSTOCELE) AND POSTERIOR REPAIR (RECTOCELE);  Surgeon: Bjorn Loser, MD;  Location: [facility];  Service: Urology;  Laterality: N/A;  . ANTERIOR CERVICAL DECOMPRESSION/DISCECTOMY FUSION 4 LEVELS Right 10/03/2016   Procedure: ANTERIOR CERVICAL DECOMPRESSION  FUSION, CERVICAL 4-5, CERVICAL 5-6, CERVICAL 6-7, CERVICAL 7 TO THORACIC 1 WITH INSTRUMENTATION AND ALLOGRAFT;  Surgeon: Phylliss Bob, MD;  Location: [facility];  Service: Orthopedics;  Laterality: Right;  ANTERIOR CERVICAL DECOMPRESSION FUSION, CERVICAL 4-5, CERVICAL 5-6, CERVICAL 6-7, CERVICAL 7 TO THORACIC 1 WITH INSTRUMENTATION AND ALLOGRAFT; REQUEST 4 HO  . ANTERIOR LAT LUMBAR FUSION Left 11/16/2015   Procedure: LEFT SIDED LATERAL INTERBODY FUSION, LUMBAR 2-3, LUMBAR 3-4, LUMBAR 4-5 WITH  INSTRUMENTATION;  Surgeon: Phylliss Bob, MD;  Location: [facility];  Service: Orthopedics;  Laterality: Left;  LEFT SIDED LATEARL INTERBODY FUSION, LUMBAR 2-3, LUMBAR 3-4, LUMBAR 4-5 WITH INSTRUMENTATION   . APPENDECTOMY    . BACK SURGERY    . BONE MARROW ASPIRATION  07/2012  . BONE MARROW BIOPSY  07/2012  . BREAST SURGERY    . CARDIAC CATHETERIZATION    . CARDIAC CATHETERIZATION N/A 01/20/2016   Procedure: Left Heart Cath and Coronary Angiography;  Surgeon: Burnell Blanks, MD;  Location: [facility];  Service: Cardiovascular;  Laterality: N/A;  . COLONOSCOPY  11/29/2010   Procedure: COLONOSCOPY;  Surgeon: Rogene Houston, MD;  [facility];  Service: Endoscopy;  Laterality: N/A;  . COLONOSCOPY N/A 02/18/2014   Procedure: COLONOSCOPY;  Surgeon: Rogene Houston, MD;  [facility];  Service: Endoscopy;  Laterality: N/A;  1030  . COLONOSCOPY N/A 02/25/2017   Procedure: COLONOSCOPY;  Surgeon: Rogene Houston, MD;  [facility];  Service: Endoscopy;  Laterality: N/A;  10:55  . CYSTOSCOPY N/A 12/09/2014   Procedure: CYSTOSCOPY;  Surgeon: Bjorn Loser, MD;  Location: [facility];  Service: Urology;  Laterality: N/A;  . ESOPHAGEAL DILATION N/A 02/25/2017   Procedure: ESOPHAGEAL DILATION;  Surgeon: Rogene Houston, MD;  [facility];  Service: Endoscopy;  Laterality: N/A;  . ESOPHAGOGASTRODUODENOSCOPY N/A 02/25/2017   Procedure: ESOPHAGOGASTRODUODENOSCOPY (EGD);  Surgeon: Rogene Houston, MD;  [facility];  Service: Endoscopy;  Laterality: N/A;  . IR GENERIC HISTORICAL  01/11/2016   IR RADIOLOGY PERIPHERAL GUIDED IV START 01/11/2016 Saverio Danker, PA-C [facility]  . IR GENERIC HISTORICAL  01/11/2016   IR US GUIDE VASC ACCESS RIGHT 01/11/2016 Saverio Danker, PA-C [facility]  . LYMPH NODE DISSECTION Right 12/30/2018   Procedure: Lymph Node Dissection;  Surgeon: Lajuana Matte, MD;  Location: [facility];  Service: Thoracic;  Laterality:  Right;  . MASTECTOMY     left  . OVARIAN CYST SURGERY     x2  . POLYPECTOMY  02/25/2017   Procedure: POLYPECTOMY;  Surgeon: Rogene Houston, MD;  [facility];  Service: Endoscopy;;  . SALPINGOOPHORECTOMY Bilateral 12/09/2014   Procedure: SALPINGO OOPHORECTOMY;  Surgeon: Servando Salina, MD;  Location: [facility];  Service: Gynecology;  Laterality: Bilateral;  . TUBAL LIGATION    . VAGINAL HYSTERECTOMY N/A 12/09/2014   Procedure: HYSTERECTOMY VAGINAL;  Surgeon: Servando Salina, MD;  Location: [facility];  Service: Gynecology;  Laterality: N/A;  . VIDEO ASSISTED THORACOSCOPY (VATS)/ LOBECTOMY Right 12/30/2018   Procedure: VIDEO ASSISTED THORACOSCOPY (VATS)/RIGHT LOWER LOBE WEDGE RESECTION, RIGHT UPPER LOBECTOMY;  Surgeon: Lajuana Matte, MD;  Location: [facility];  Service: Thoracic;  Laterality: Right;  Marland Kitchen VIDEO BRONCHOSCOPY N/A 12/30/2018   Procedure: VIDEO BRONCHOSCOPY;  Surgeon: Lajuana Matte, MD;  Location: [facility];  Service: Thoracic;  Laterality: N/A;     SOCIAL HISTORY:  Social History   Socioeconomic History  . Marital status: Divorced    Spouse name: Not  on file  . Number of children: 2  . Years of education: Not on file  . Highest education level: Not on file  Occupational History  . Not on file  Social Needs  . Financial resource strain: Not on file  . Food insecurity    Worry: Not on file    Inability: Not on file  . Transportation needs    Medical: Not on file    Non-medical: Not on file  Tobacco Use  . Smoking status: Former Smoker    Packs/day: 0.50    Years: 50.00    Pack years: 25.00    Types: Cigarettes    Quit date: 11/25/2018    Years since quitting: 0.1  . Smokeless tobacco: Never Used  Substance and Sexual Activity  . Alcohol use: Yes    Comment: occasional  . Drug use: No  . Sexual activity: Yes    Birth control/protection: Post-menopausal  Lifestyle  . Physical activity    Days per week: Not on file    Minutes per session: Not  on file  . Stress: Not on file  Relationships  . Social Herbalist on phone: Not on file    Gets together: Not on file    Attends religious service: Not on file    Active member of club or organization: Not on file    Attends meetings of clubs or organizations: Not on file    Relationship status: Not on file  . Intimate partner violence    Fear of current or ex partner: Not on file    Emotionally abused: Not on file    Physically abused: Not on file    Forced sexual activity: Not on file  Other Topics Concern  . Not on file  Social History Narrative  . Not on file    FAMILY HISTORY:  Family History  Problem Relation Age of Onset  . Hypertension Mother   . Heart failure Mother   . Congestive Heart Failure Mother   . COPD Mother   . Pernicious anemia Mother   . Cancer Mother        lung  . Hypertension Father   . CAD Father   . Heart attack Father   . Hypertension Sister   . Cancer Other   . Celiac disease Other     CURRENT MEDICATIONS:  Outpatient Encounter Medications as of 01/20/2019  Medication Sig  . amLODipine (NORVASC) 2.5 MG tablet Take 1 tablet (2.5 mg total) by mouth daily.  Marland Kitchen aspirin EC 81 MG tablet Take 81 mg by mouth daily.  . Calcium Carb-Cholecalciferol (CALCIUM 600+D) 600-800 MG-UNIT TABS Take 1 tablet by mouth 2 (two) times daily.  . Cholecalciferol (VITAMIN D-3) 1000 units CAPS Take 1,000 Units daily by mouth.   . clonazePAM (KLONOPIN) 0.5 MG tablet Take 1 tablet (0.5 mg total) by mouth at bedtime. If take this at bedtime, do NOT take an Oxycodone at night  . cyanocobalamin (,VITAMIN B-12,) 1000 MCG/ML injection INJECT 1 ML INTO THE MUSCLE ONCE MONTHLY AS DIRECTED. (Patient taking differently: Inject 1,000 mcg into the muscle every 30 (thirty) days. )  . gabapentin (NEURONTIN) 300 MG capsule Take 300 mg by mouth at bedtime.  Marland Kitchen guaiFENesin (MUCINEX) 600 MG 12 hr tablet Take 1 tablet (600 mg total) by mouth 2 (two) times daily as needed for  cough or to loosen phlegm.  . hydroxyurea (HYDREA) 500 MG capsule TAKE 1 CAPSULE DAILY. Please contact medical doctor who prescribed  this as creatinine elevated at 1.52 on 01/07/2019.  . ibandronate (BONIVA) 150 MG tablet Take 150 mg by mouth every 30 (thirty) days.   . pantoprazole (PROTONIX) 40 MG tablet Take 40 mg daily by mouth.  . rosuvastatin (CRESTOR) 40 MG tablet Take 40 mg at bedtime by mouth.   Marland Kitchen albuterol (PROVENTIL HFA;VENTOLIN HFA) 108 (90 Base) MCG/ACT inhaler Inhale 2 puffs into the lungs every 6 (six) hours as needed for wheezing or shortness of breath. (Patient not taking: Reported on 01/20/2019)  . ibuprofen (ADVIL,MOTRIN) 800 MG tablet Take 800 mg every 8 (eight) hours as needed by mouth for moderate pain.  Marland Kitchen ipratropium-albuterol (DUONEB) 0.5-2.5 (3) MG/3ML SOLN Take 3 mLs by nebulization every 6 (six) hours as needed (shortness of breath). (Patient not taking: Reported on 01/20/2019)  . methocarbamol (ROBAXIN) 500 MG tablet Take 500 mg by mouth every 6 (six) hours as needed for muscle spasms.   . ondansetron (ZOFRAN) 8 MG tablet Take 1 tablet (8 mg total) by mouth every 8 (eight) hours as needed for nausea or vomiting. (Patient not taking: Reported on 01/20/2019)  . oxyCODONE (OXY IR/ROXICODONE) 5 MG immediate release tablet Take 1 tablet (5 mg total) by mouth every 4 (four) hours as needed for moderate pain. (Patient not taking: Reported on 01/20/2019)  . polyethylene glycol (MIRALAX / GLYCOLAX) 17 g packet Take 17 g by mouth daily as needed for mild constipation.   No facility-administered encounter medications on file as of 01/20/2019.     ALLERGIES:  Allergies  Allergen Reactions  . Penicillins Hives and Rash    Did it involve swelling of the face/tongue/throat, SOB, or low BP? No Did it involve sudden or severe rash/hives, skin peeling, or any reaction on the inside of your mouth or nose? No Did you need to seek medical attention at a hospital or doctor's office? No When  did it last happen?5-10 year If all above answers are "NO", may proceed with cephalosporin use.    . Tape Rash    Medipore, Coban, and paper tape CAN be tolerated     PHYSICAL EXAM:  ECOG Performance status: 1  Vitals:   01/20/19 1211  BP: 114/70  Pulse: 75  Resp: 18  Temp: 97.9 F (36.6 C)  SpO2: 100%   Filed Weights   01/20/19 1211  Weight: 106 lb 3.2 oz (48.2 kg)    Physical Exam Constitutional:      Appearance: Normal appearance. She is normal weight.  Cardiovascular:     Rate and Rhythm: Normal rate and regular rhythm.     Heart sounds: Normal heart sounds.  Pulmonary:     Effort: Pulmonary effort is normal.     Breath sounds: Normal breath sounds.  Abdominal:     General: Abdomen is flat. There is no distension.     Palpations: Abdomen is soft. There is no mass.  Musculoskeletal: Normal range of motion.  Skin:    General: Skin is warm and dry.  Neurological:     Mental Status: She is alert and oriented to person, place, and time. Mental status is at baseline.  Psychiatric:        Mood and Affect: Mood normal.        Behavior: Behavior normal.        Thought Content: Thought content normal.        Judgment: Judgment normal.    No palpable splenomegaly.  No leg ulcers.  LABORATORY DATA:  I have reviewed the labs as  listed.  CBC    Component Value Date/Time   WBC 8.7 01/20/2019 1118   RBC 2.49 (L) 01/20/2019 1118   HGB 8.9 (L) 01/20/2019 1118   HCT 29.1 (L) 01/20/2019 1118   PLT 747 (H) 01/20/2019 1118   MCV 116.9 (H) 01/20/2019 1118   MCH 35.7 (H) 01/20/2019 1118   MCHC 30.6 01/20/2019 1118   RDW 16.9 (H) 01/20/2019 1118   LYMPHSABS 1.0 01/20/2019 1118   MONOABS 0.8 01/20/2019 1118   EOSABS 0.3 01/20/2019 1118   BASOSABS 0.1 01/20/2019 1118   CMP Latest Ref Rng & Units 01/20/2019 01/07/2019 01/06/2019  Glucose 70 - 99 mg/dL 122(H) 100(H) 96  BUN 8 - 23 mg/dL '18 14 20  ' Creatinine 0.44 - 1.00 mg/dL 1.32(H) 1.52(H) 1.56(H)  Sodium 135 -  145 mmol/L 134(L) 137 137  Potassium 3.5 - 5.1 mmol/L 4.8 2.8(L) 3.7  Chloride 98 - 111 mmol/L 106 109 110  CO2 22 - 32 mmol/L 22 20(L) 20(L)  Calcium 8.9 - 10.3 mg/dL 9.3 8.1(L) 8.0(L)  Total Protein 6.5 - 8.1 g/dL 6.8 - -  Total Bilirubin 0.3 - 1.2 mg/dL 0.5 - -  Alkaline Phos 38 - 126 U/L 89 - -  AST 15 - 41 U/L 40 - -  ALT 0 - 44 U/L 24 - -       DIAGNOSTIC IMAGING:  I have independently reviewed the scans and discussed with the patient.     ASSESSMENT & PLAN:   Small cell lung cancer, right ([place]) 1.  Small cell lung cancer: -PET scan on 10/24/2018 shows hypermetabolic right upper lobe pulmonary nodules, suspicious for synchronous primary bronchogenic carcinomas. - Core needle biopsy of the right upper lobe on 11/21/2018 consistent with small cell carcinoma. - She right upper lobectomy on 12/30/2018 showing invasive small cell carcinoma, 4.2 cm, involving visceral pleura, 1 out of 3 lymph nodes positive, lymphovascular invasion positive.  Metastatic carcinoma in 211 or lymph nodes present.  Right chest wall biopsy was consistent with small cell carcinoma. - Her case was discussed at tumor board.  Radiation therapy to the chest wall was recommended followed by systemic chemotherapy. - She will see Dr. Sondra Come this Thursday. -I have recommended restaging PET scan at this time.  I will see her back after the PET scan.  We will talk about chemotherapy at that time.  2.  Essential thrombocytosis: - She takes hydroxyurea 500 mg Tuesday through Friday and thousand milligrams on Saturday, Sunday and Monday. - Her platelet count today is elevated at 747, likely reactive from recent surgery.  3.  Left breast cancer: -Diagnosed in 1995, treated with chemotherapy. -Mammogram on 09/10/2018 with right breast was BI-RADS Category 1.  4.  Melanoma: -She had melanoma resected from left upper thigh in 2015.  Total time spent is 25 minutes with more than 50% of the time spent face-to-face  discussing treatment plan, counseling and coordination of care.  Orders placed this encounter:  Orders Placed This Encounter  Procedures  . NM PET Image Restag (PS) Skull Base To Thigh      Derek Jack, MD [facility] [phone]

## 2019-01-22 ENCOUNTER — Ambulatory Visit
Admission: RE | Admit: 2019-01-22 | Discharge: 2019-01-22 | Disposition: A | Discharge status: Home / Self Care | Payer: Medicare Other | Source: Ambulatory Visit | Attending: Radiation Oncology | Admitting: Radiation Oncology

## 2019-01-22 ENCOUNTER — Encounter: Payer: Self-pay | Admitting: Radiation Oncology

## 2019-01-22 ENCOUNTER — Other Ambulatory Visit: Payer: Self-pay

## 2019-01-22 VITALS — BP 110/62 | HR 77 | Temp 98.7°F | Resp 18 | Ht 62.0 in

## 2019-01-22 DIAGNOSIS — I7300 Raynaud's syndrome without gangrene: Secondary | ICD-10-CM | POA: Diagnosis not present

## 2019-01-22 DIAGNOSIS — Z9221 Personal history of antineoplastic chemotherapy: Secondary | ICD-10-CM | POA: Diagnosis not present

## 2019-01-22 DIAGNOSIS — C3491 Malignant neoplasm of unspecified part of right bronchus or lung: Secondary | ICD-10-CM

## 2019-01-22 DIAGNOSIS — C3411 Malignant neoplasm of upper lobe, right bronchus or lung: Secondary | ICD-10-CM | POA: Diagnosis present

## 2019-01-22 DIAGNOSIS — M8580 Other specified disorders of bone density and structure, unspecified site: Secondary | ICD-10-CM | POA: Insufficient documentation

## 2019-01-22 DIAGNOSIS — Z79899 Other long term (current) drug therapy: Secondary | ICD-10-CM | POA: Insufficient documentation

## 2019-01-22 DIAGNOSIS — Z853 Personal history of malignant neoplasm of breast: Secondary | ICD-10-CM | POA: Diagnosis not present

## 2019-01-22 DIAGNOSIS — Z87891 Personal history of nicotine dependence: Secondary | ICD-10-CM | POA: Diagnosis not present

## 2019-01-22 DIAGNOSIS — Z85820 Personal history of malignant melanoma of skin: Secondary | ICD-10-CM | POA: Insufficient documentation

## 2019-01-22 DIAGNOSIS — I2510 Atherosclerotic heart disease of native coronary artery without angina pectoris: Secondary | ICD-10-CM | POA: Diagnosis not present

## 2019-01-22 DIAGNOSIS — E785 Hyperlipidemia, unspecified: Secondary | ICD-10-CM | POA: Insufficient documentation

## 2019-01-22 DIAGNOSIS — I714 Abdominal aortic aneurysm, without rupture: Secondary | ICD-10-CM | POA: Diagnosis not present

## 2019-01-22 DIAGNOSIS — I10 Essential (primary) hypertension: Secondary | ICD-10-CM | POA: Diagnosis not present

## 2019-01-22 DIAGNOSIS — J439 Emphysema, unspecified: Secondary | ICD-10-CM | POA: Diagnosis not present

## 2019-01-22 DIAGNOSIS — K219 Gastro-esophageal reflux disease without esophagitis: Secondary | ICD-10-CM | POA: Diagnosis not present

## 2019-01-22 DIAGNOSIS — I509 Heart failure, unspecified: Secondary | ICD-10-CM | POA: Insufficient documentation

## 2019-01-22 DIAGNOSIS — Z923 Personal history of irradiation: Secondary | ICD-10-CM | POA: Diagnosis not present

## 2019-01-22 DIAGNOSIS — E7800 Pure hypercholesterolemia, unspecified: Secondary | ICD-10-CM | POA: Diagnosis not present

## 2019-01-22 NOTE — Progress Notes (Signed)
Thoracic Location of Tumor / Histology: right upper lobe pulmonary nodules   Biopsies revealed: 12/30/18: Surgical Pathology  CASE: [id number]  PATIENT: Ashley [name]  Surgical Pathology Report  Clinical History: RUL pulmonary nodules (cm)  DIAGNOSIS:   A. RIGHT LOWER LOBE LUNG WEDGE RESECTION  - Benign lymph node.   B. RIGHT RIB PLEURA  - Benign fibrous tissue and lung parenchyma and bone.  - There is no evidence of malignancy.   C. LEVEL 7 LYMPH NODE  - There is no evidence of carcinoma in 1 of 1 lymph node (0/1).   D. 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   E. 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   F. RIGHT UPPER LUNG LOBECTOMY  - Invasive small cell carcinoma, 4.2 cm.  - Carcinoma involves the visceral pleura.  - Metastatic carcinoma in 1 of 3 lymph nodes (1/3).  - Lymphovascular invasion is identified.  - The surgical resection margins are negative for carcinoma.  - See oncology table below.   G. LEVEL 12 R LYMPH NODE  - There is no evidence of carcinoma in 1 of 1 lymph node (0/1).   H. RIGHT CHEST WALL BIOPSY:  - Small cell carcinoma.   Tobacco/Marijuana/Snuff/ETOH use: Tobacco Use  . Smoking status: Former Smoker    Packs/day: 0.50    Years: 50.00    Pack years: 25.00    Types: Cigarettes    Quit date: 11/25/2018    Years since quitting: 0.1  . Smokeless tobacco: Never Used  Substance and Sexual Activity  . Alcohol use: Yes    Comment: occasional  . Drug use: No      Past/Anticipated interventions by cardiothoracic surgery, if any: 12/30/18: Dr. Kipp Brood:  Procedure: - Bronchoscopy - Right Video assisted thoracoscopy - right upper lobectomy - Mediastinal lymph node sampling - pleural biopsy - Intercostal nerve block  Past/Anticipated interventions by medical oncology, if any: Per Dr. Delton Coombes 01/20/19:  1.  Small cell lung  cancer: -PET scan on 10/24/2018 shows hypermetabolic right upper lobe pulmonary nodules, suspicious for synchronous primary bronchogenic carcinomas. - Core needle biopsy of the right upper lobe on 11/21/2018 consistent with small cell carcinoma. - She right upper lobectomy on 12/30/2018 showing invasive small cell carcinoma, 4.2 cm, involving visceral pleura, 1 out of 3 lymph nodes positive, lymphovascular invasion positive.  Metastatic carcinoma in 211 or lymph nodes present.  Right chest wall biopsy was consistent with small cell carcinoma. - Her case was discussed at tumor board.  Radiation therapy to the chest wall was recommended followed by systemic chemotherapy. - She will see Dr. Sondra Come this Thursday. -I have recommended restaging PET scan at this time.  I will see her back after the PET scan.  We will talk about chemotherapy at that time.  2.  Essential thrombocytosis: - She takes hydroxyurea 500 mg Tuesday through Friday and thousand milligrams on Saturday, Sunday and Monday. - Her platelet count today is elevated at 747, likely reactive from recent surgery.  3.  Left breast cancer: -Diagnosed in 1995, treated with chemotherapy. -Mammogram on 09/10/2018 with right breast was BI-RADS Category 1.  4.  Melanoma: -She had melanoma resected from left upper thigh in 2015.  Signs/Symptoms Weight changes, if any:  Wt Readings from Last 3 Encounters:  01/20/19 106 lb 3.2 oz (48.2 kg)  01/16/19 103 lb 12.8 oz (47.1  kg)  12/30/18 113 lb 12.1 oz (51.6 kg)       Respiratory complaints, if any: She underwent resection about 3 weeks ago.  Her breathing and functional status is improving slowly.  She is doing physical therapy at home.  Low back pain is reported as 2 out of 10.  Appetite is 50%.  Energy levels are 25%. Positive for shortness of breath  Hemoptysis, if any: Pt denies hemoptysis  Pain issues, if any:  Pt denies c/o pain.  SAFETY ISSUES:  Prior radiation?  No  Pacemaker/ICD? No  Possible current pregnancy? No  Is the patient on methotrexate? No  Current Complaints / other details:  Pt presents today for initial consult with Dr. Sondra Come for Radiation Oncology. Pt is accompanied by daughter, [name].  BP 110/62 (BP Location: Right Arm, Patient Position: Sitting)   Pulse 77   Temp 98.7 F (37.1 C) (Temporal)   Resp 18   Ht 5\' 2"  (1.575 m)   SpO2 100%   BMI 19.42 kg/m   Loma Sousa, RN BSN

## 2019-01-22 NOTE — Patient Instructions (Signed)
Coronavirus (COVID-19) Are you at risk?  Are you at risk for the Coronavirus (COVID-19)?  To be considered HIGH RISK for Coronavirus (COVID-19), you have to meet the following criteria:  . Traveled to [place], [place], [place], [place] or [place]; or in the [place] to [place], [place], [place], or [place]; and have fever, cough, and shortness of breath within the last 2 weeks of travel OR . Been in close contact with a person diagnosed with COVID-19 within the last 2 weeks and have fever, cough, and shortness of breath . IF YOU DO NOT MEET THESE CRITERIA, YOU ARE CONSIDERED LOW RISK FOR COVID-19.  What to do if you are HIGH RISK for COVID-19?  . If you are having a medical emergency, call 911. . Seek medical care right away. Before you go to a doctor's office, urgent care or emergency department, call ahead and tell them about your recent travel, contact with someone diagnosed with COVID-19, and your symptoms. You should receive instructions from your physician's office regarding next steps of care.  . When you arrive at healthcare provider, tell the healthcare staff immediately you have returned from visiting [place], [place], [place], [place] or [place]; or traveled in the [place] to [place], [place], [place], or [place]; in the last two weeks or you have been in close contact with a person diagnosed with COVID-19 in the last 2 weeks.   . Tell the health care staff about your symptoms: fever, cough and shortness of breath. . After you have been seen by a medical provider, you will be either: o Tested for (COVID-19) and discharged home on quarantine except to seek medical care if symptoms worsen, and asked to  - Stay home and avoid contact with others until you get your results (4-5 days)  - Avoid travel on public transportation if possible (such as bus, train, or airplane) or o Sent to the Emergency Department by EMS for evaluation, COVID-19 testing, and possible  admission depending on your condition and test results.  What to do if you are LOW RISK for COVID-19?  Reduce your risk of any infection by using the same precautions used for avoiding the common cold or flu:  . Wash your hands often with soap and warm water for at least 20 seconds.  If soap and water are not readily available, use an alcohol-based hand sanitizer with at least 60% alcohol.  . If coughing or sneezing, cover your mouth and nose by coughing or sneezing into the elbow areas of your shirt or coat, into a tissue or into your sleeve (not your hands). . Avoid shaking hands with others and consider head nods or verbal greetings only. . Avoid touching your eyes, nose, or mouth with unwashed hands.  . Avoid close contact with people who are sick. . Avoid places or events with large numbers of people in one location, like concerts or sporting events. . Carefully consider travel plans you have or are making. . If you are planning any travel outside or inside the [place], visit the CDC's Travelers' Health webpage for the latest health notices. . If you have some symptoms but not all symptoms, continue to monitor at home and seek medical attention if your symptoms worsen. . If you are having a medical emergency, call 911.   ADDITIONAL HEALTHCARE OPTIONS FOR PATIENTS  [facility] / e-Visit: [email or web address]         [facility]: [phone]  [facility]: [phone]                   [facility]: [phone]   

## 2019-01-22 NOTE — Progress Notes (Signed)
Radiation Oncology         [phone] ________________________________  Initial Outpatient Consultation  Name: Ashley [name] MRN: [id number]  Date: 01/22/2019  DOB: 1950/12/23  BX:IDHWYSH, Ashley Quint, NP  Lajuana Matte, MD   REFERRING PHYSICIAN: Lajuana Matte, MD  DIAGNOSIS: The encounter diagnosis was Small cell lung cancer, right ([place]).  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 female who is accompanied by her daughter, Ashley [name]. She has a oncological history significant for adenocarcinoma of the left breast, s/p chemotherapy and mastectomy in 12/1993. She has been followed for this, as well as thrombocytopenia, by [facility]. She also has a history of tobacco use, as well as thoracic ascending aortic aneurysm, and has been followed with serial scans. The patient presented for follow up chest CT on 04/15/2018, which showed an enlarging 1 cm sub-solid right upper lobe pulmonary nodule. Repeat chest CT performed on 10/14/2018 revealed: enlarging right upper lobe spiculated pulmonary nodule with new adjacent pleural base nodule; several additional new peripheral nodules in left and right lungs.  She proceeded to PET scan on 10/24/2018, which revealed: hypermetabolic right upper lobe pulmonary nodules suspicious for synchronous primary bronchogenic carcinomas; no evidence of thoracic nodal hypermetabolism or typical findings of distant metastasis.  She was referred to Dr. Kipp Brood on 11/07/2018. The patient underwent a biopsy on 11/21/2018 showing: small cell carcinoma. Unfortunately, she developed pneumothorax following biopsy, so she underwent chest tube placement and hospital admission.  Staging brain MRI was performed on 11/27/2018 and was negative for intracranial metastatic disease.  She was then recommended to undergo bronchoscopy to assess lymph nodes followed by lobectomy. Performed on  12/30/2018, pathology from the procedure showed:  1. Right Upper Lung Lobectomy   - invasive small cell carcinoma, 4.2 cm   - involving the visceral pleura   - lymphovascular invasion present   - resection margins negative 2. Right Chest Wall Biopsy  - small cell carcinoma 3. Lymph Nodes, 8 total  - 3 positive for metastatic small cell carcinoma (3/8).  The patient's case was discussed at [facility] on 01/15/2019. She last saw Dr. Vickey Huger on 01/20/2019, who explained to the patient the plan of radiation therapy followed by systemic chemotherapy. He has also ordered repeat PET scan, which is scheduled for 01/26/2019.   PREVIOUS RADIATION THERAPY: No  PAST MEDICAL HISTORY:  Past Medical History:  Diagnosis Date   Adenocarcinoma of left breast ([place]) 01/09/2016   Anginal pain (HCC)    Arthritis    Ascending aortic aneurysm (HCC)    Cancer (HCC) 1995   breast, left, mastectomy/chemo   Chest pain    Possibly cardiac. No evidence of ischemia/injury based upon normal troponin I. Chest discomfort could be tachycardia induced supply demand mismatch.    CHF (congestive heart failure) ([place]) 11/17/2015   after surgery    Colon adenomas    Coronary artery disease    DJD (degenerative joint disease)    Dyspnea    with exertion   Emphysema of lung ([place]) 11/17/2015   Essential hypertension, benign    GERD (gastroesophageal reflux disease)    History of hiatal hernia    Hyperlipidemia    Hypertension    Melanoma ([place]) 01/09/2016   Osteopenia    Palpitations    Pernicious anemia 03/06/2016   Pernicious anemia    Pure hypercholesterolemia    Raynaud's disease    Thrombocythemia, essential (  [place]) 01/09/2016   Thrombocytosis (HCC)    Idiopathic   Vitamin D deficiency     PAST SURGICAL HISTORY: Past Surgical History:  Procedure Laterality Date   ABDOMINAL HYSTERECTOMY     ANTERIOR AND POSTERIOR REPAIR N/A 12/09/2014   Procedure: ANTERIOR (CYSTOCELE) AND POSTERIOR  REPAIR (RECTOCELE);  Surgeon: Bjorn Loser, MD;  Location: [facility];  Service: Urology;  Laterality: N/A;   ANTERIOR CERVICAL DECOMPRESSION/DISCECTOMY FUSION 4 LEVELS Right 10/03/2016   Procedure: ANTERIOR CERVICAL DECOMPRESSION FUSION, CERVICAL 4-5, CERVICAL 5-6, CERVICAL 6-7, CERVICAL 7 TO THORACIC 1 WITH INSTRUMENTATION AND ALLOGRAFT;  Surgeon: Phylliss Bob, MD;  Location: [facility];  Service: Orthopedics;  Laterality: Right;  ANTERIOR CERVICAL DECOMPRESSION FUSION, CERVICAL 4-5, CERVICAL 5-6, CERVICAL 6-7, CERVICAL 7 TO THORACIC 1 WITH INSTRUMENTATION AND ALLOGRAFT; REQUEST 4 HO   ANTERIOR LAT LUMBAR FUSION Left 11/16/2015   Procedure: LEFT SIDED LATERAL INTERBODY FUSION, LUMBAR 2-3, LUMBAR 3-4, LUMBAR 4-5 WITH INSTRUMENTATION;  Surgeon: Phylliss Bob, MD;  Location: [facility];  Service: Orthopedics;  Laterality: Left;  LEFT SIDED LATEARL INTERBODY FUSION, LUMBAR 2-3, LUMBAR 3-4, LUMBAR 4-5 WITH INSTRUMENTATION    APPENDECTOMY     BACK SURGERY     BONE MARROW ASPIRATION  07/2012   BONE MARROW BIOPSY  07/2012   BREAST SURGERY     CARDIAC CATHETERIZATION     CARDIAC CATHETERIZATION N/A 01/20/2016   Procedure: Left Heart Cath and Coronary Angiography;  Surgeon: Burnell Blanks, MD;  Location: [facility];  Service: Cardiovascular;  Laterality: N/A;   COLONOSCOPY  11/29/2010   Procedure: COLONOSCOPY;  Surgeon: Rogene Houston, MD;  [facility];  Service: Endoscopy;  Laterality: N/A;   COLONOSCOPY N/A 02/18/2014   Procedure: COLONOSCOPY;  Surgeon: Rogene Houston, MD;  [facility];  Service: Endoscopy;  Laterality: N/A;  1030   COLONOSCOPY N/A 02/25/2017   Procedure: COLONOSCOPY;  Surgeon: Rogene Houston, MD;  [facility];  Service: Endoscopy;  Laterality: N/A;  10:55   CYSTOSCOPY N/A 12/09/2014   Procedure: CYSTOSCOPY;  Surgeon: Bjorn Loser, MD;  Location: [facility];  Service: Urology;  Laterality: N/A;   ESOPHAGEAL DILATION N/A 02/25/2017     Procedure: ESOPHAGEAL DILATION;  Surgeon: Rogene Houston, MD;  [facility];  Service: Endoscopy;  Laterality: N/A;   ESOPHAGOGASTRODUODENOSCOPY N/A 02/25/2017   Procedure: ESOPHAGOGASTRODUODENOSCOPY (EGD);  Surgeon: Rogene Houston, MD;  [facility];  Service: Endoscopy;  Laterality: N/A;   IR GENERIC HISTORICAL  01/11/2016   IR RADIOLOGY PERIPHERAL GUIDED IV START 01/11/2016 Saverio Danker, PA-C [facility]   IR GENERIC HISTORICAL  01/11/2016   IR US GUIDE VASC ACCESS RIGHT 01/11/2016 Saverio Danker, PA-C [facility]   LYMPH NODE DISSECTION Right 12/30/2018   Procedure: Lymph Node Dissection;  Surgeon: Lajuana Matte, MD;  Location: [facility];  Service: Thoracic;  Laterality: Right;   MASTECTOMY     left   OVARIAN CYST SURGERY     x2   POLYPECTOMY  02/25/2017   Procedure: POLYPECTOMY;  Surgeon: Rogene Houston, MD;  [facility];  Service: Endoscopy;;   SALPINGOOPHORECTOMY Bilateral 12/09/2014   Procedure: SALPINGO OOPHORECTOMY;  Surgeon: Servando Salina, MD;  Location: [facility];  Service: Gynecology;  Laterality: Bilateral;   TUBAL LIGATION     VAGINAL HYSTERECTOMY N/A 12/09/2014   Procedure: HYSTERECTOMY VAGINAL;  Surgeon: Servando Salina, MD;  Location: [facility];  Service: Gynecology;  Laterality: N/A;   VIDEO ASSISTED THORACOSCOPY (VATS)/  LOBECTOMY Right 12/30/2018   Procedure: VIDEO ASSISTED THORACOSCOPY (VATS)/RIGHT LOWER LOBE WEDGE RESECTION, RIGHT UPPER LOBECTOMY;  Surgeon: Lajuana Matte, MD;  Location: [place];  Service: Thoracic;  Laterality: Right;   VIDEO BRONCHOSCOPY N/A 12/30/2018   Procedure: VIDEO BRONCHOSCOPY;  Surgeon: Lajuana Matte, MD;  [facility];  Service: Thoracic;  Laterality: N/A;    FAMILY HISTORY:  Family History  Problem Relation Age of Onset   Hypertension Mother    Heart failure Mother    Congestive Heart Failure Mother    COPD Mother    Pernicious anemia Mother    Cancer Mother         lung   Hypertension Father    CAD Father    Heart attack Father    Hypertension Sister    Cancer Other    Celiac disease Other     SOCIAL HISTORY:  Social History   Tobacco Use   Smoking status: Former Smoker    Packs/day: 0.50    Years: 50.00    Pack years: 25.00    Types: Cigarettes    Quit date: 11/25/2018    Years since quitting: 0.1   Smokeless tobacco: Never Used  Substance Use Topics   Alcohol use: Yes    Comment: occasional   Drug use: No    ALLERGIES:  Allergies  Allergen Reactions   Penicillins Hives and Rash    Did it involve swelling of the face/tongue/throat, SOB, or low BP? No Did it involve sudden or severe rash/hives, skin peeling, or any reaction on the inside of your mouth or nose? No Did you need to seek medical attention at a hospital or doctor's office? No When did it last happen?5-10 year If all above answers are NO, may proceed with cephalosporin use.     Tape Rash    Medipore, Coban, and paper tape CAN be tolerated    MEDICATIONS:  Current Outpatient Medications  Medication Sig Dispense Refill   amLODipine (NORVASC) 2.5 MG tablet Take 1 tablet (2.5 mg total) by mouth daily.     aspirin EC 81 MG tablet Take 81 mg by mouth daily.     Calcium Carb-Cholecalciferol (CALCIUM 600+D) 600-800 MG-UNIT TABS Take 1 tablet by mouth 2 (two) times daily.     Cholecalciferol (VITAMIN D-3) 1000 units CAPS Take 1,000 Units daily by mouth.      clonazePAM (KLONOPIN) 0.5 MG tablet Take 1 tablet (0.5 mg total) by mouth at bedtime. If take this at bedtime, do NOT take an Oxycodone at night 90 tablet 2   cyanocobalamin (,VITAMIN B-12,) 1000 MCG/ML injection INJECT 1 ML INTO THE MUSCLE ONCE MONTHLY AS DIRECTED. (Patient taking differently: Inject 1,000 mcg into the muscle every 30 (thirty) days. ) 1 mL 5   gabapentin (NEURONTIN) 300 MG capsule Take 300 mg by mouth at bedtime.     guaiFENesin (MUCINEX) 600 MG 12 hr tablet Take 1  tablet (600 mg total) by mouth 2 (two) times daily as needed for cough or to loosen phlegm.     hydroxyurea (HYDREA) 500 MG capsule TAKE 1 CAPSULE DAILY. Please contact medical doctor who prescribed this as creatinine elevated at 1.52 on 01/07/2019. 120 capsule 4   ibandronate (BONIVA) 150 MG tablet Take 150 mg by mouth every 30 (thirty) days.      ibuprofen (ADVIL,MOTRIN) 800 MG tablet Take 800 mg every 8 (eight) hours as needed by mouth for moderate pain.     methocarbamol (ROBAXIN) 500 MG tablet Take  500 mg by mouth every 6 (six) hours as needed for muscle spasms.      pantoprazole (PROTONIX) 40 MG tablet Take 40 mg daily by mouth.     polyethylene glycol (MIRALAX / GLYCOLAX) 17 g packet Take 17 g by mouth daily as needed for mild constipation.     rosuvastatin (CRESTOR) 40 MG tablet Take 40 mg at bedtime by mouth.      albuterol (PROVENTIL HFA;VENTOLIN HFA) 108 (90 Base) MCG/ACT inhaler Inhale 2 puffs into the lungs every 6 (six) hours as needed for wheezing or shortness of breath. (Patient not taking: Reported on 01/20/2019) 1 Inhaler 2   ipratropium-albuterol (DUONEB) 0.5-2.5 (3) MG/3ML SOLN Take 3 mLs by nebulization every 6 (six) hours as needed (shortness of breath). (Patient not taking: Reported on 01/20/2019) 360 mL 0   ondansetron (ZOFRAN) 8 MG tablet Take 1 tablet (8 mg total) by mouth every 8 (eight) hours as needed for nausea or vomiting. (Patient not taking: Reported on 01/20/2019) 20 tablet 5   oxyCODONE (OXY IR/ROXICODONE) 5 MG immediate release tablet Take 1 tablet (5 mg total) by mouth every 4 (four) hours as needed for moderate pain. (Patient not taking: Reported on 01/20/2019) 28 tablet 0   No current facility-administered medications for this encounter.     REVIEW OF SYSTEMS:  A 10+ POINT REVIEW OF SYSTEMS WAS OBTAINED including neurology, dermatology, psychiatry, cardiac, respiratory, lymph, extremities, GI, GU, musculoskeletal, constitutional, reproductive, HEENT. She  reports shortness of breath, decreased appetite, fatigue, and low back pain rated 2/10.   PHYSICAL EXAM:  height is _0  (1.575 m). Her temporal temperature is 98.7 F (37.1 C). Her blood pressure is 110/62 and her pulse is 77. Her respiration is 18 and oxygen saturation is 100%.   General: Alert and oriented, in no acute distress. She is unsteady with walking and remained in a wheelchair during evaluation.  HEENT: Head is normocephalic. Extraocular movements are intact. Oropharynx is clear. Neck: Neck is supple, no palpable cervical or supraclavicular lymphadenopathy. Heart: Regular in rate and rhythm with no murmurs, rubs, or gallops. Chest: Clear to auscultation bilaterally, with no rhonchi, wheezes, or rales. Abdomen: Soft, nontender, nondistended, with no rigidity or guarding. Extremities: No cyanosis or edema. Lymphatics: see Neck Exam Skin: No concerning lesions. Musculoskeletal: symmetric strength and muscle tone throughout. Neurologic: Cranial nerves II through XII are grossly intact. No obvious focalities. Speech is fluent. Coordination is intact. Psychiatric: Judgment and insight are intact. Affect is appropriate. Right Breast: no palpable mass, nipple discharge or bleeding. Patient has small scars along the right lateral chest from chest tube placement. No signs of drainage or infection. Left Breast: reconstructed with saline implant in place. No obvious signs of recurrence.   ECOG = 2  0 - Asymptomatic (Fully active, able to carry on all predisease activities without restriction)  1 - Symptomatic but completely ambulatory (Restricted in physically strenuous activity but ambulatory and able to carry out work of a light or sedentary nature. For example, light housework, office work)  2 - Symptomatic, <50% in bed during the day (Ambulatory and capable of all self care but unable to carry out any work activities. Up and about more than 50% of waking hours)  3 - Symptomatic, >50%  in bed, but not bedbound (Capable of only limited self-care, confined to bed or chair 50% or more of waking hours)  4 - Bedbound (Completely disabled. Cannot carry on any self-care. Totally confined to bed or chair)  5 - Death  Oken MM, Creech RH, Tormey DC, et al. [phone]). "Toxicity and response criteria of the [facility]". [place] Oncol. 5 (6): 649-55  LABORATORY DATA:  Lab Results  Component Value Date   WBC 8.7 01/20/2019   HGB 8.9 (L) 01/20/2019   HCT 29.1 (L) 01/20/2019   MCV 116.9 (H) 01/20/2019   PLT 747 (H) 01/20/2019   NEUTROABS 6.6 01/20/2019   Lab Results  Component Value Date   NA 134 (L) 01/20/2019   K 4.8 01/20/2019   CL 106 01/20/2019   CO2 22 01/20/2019   GLUCOSE 122 (H) 01/20/2019   CREATININE 1.32 (H) 01/20/2019   CALCIUM 9.3 01/20/2019      RADIOGRAPHY: Dg Chest 2 View  Result Date: 01/16/2019 CLINICAL DATA:  Small-cell lung cancer status post VATS. EXAM: CHEST - 2 VIEW COMPARISON:  01/07/2019 FINDINGS: There is stable right perihilar soft tissue prominence. There is mild right perihilar interstitial thickening. There is right apical pleural thickening. There is right lung volume loss. Left lung is clear. No left pleural effusion or pneumothorax. Stable heart size. No acute osseous abnormality. Significant interval improvement in the degree of soft tissue emphysema along the right lateral chest wall. IMPRESSION: 1. Stable right perihilar soft tissue prominence consistent with post surgical changes. Improvement in the degree of interstitial disease in the right lung. Significant improvement in the soft tissue emphysema in the right chest wall. Electronically Signed   By: Kathreen Devoid   On: 01/16/2019 13:35   Dg Chest 2 View  Result Date: 01/07/2019 CLINICAL DATA:  Pneumothorax.  History of lung cancer. EXAM: CHEST - 2 VIEW COMPARISON:  Radiographs of January 06, 2019. FINDINGS: Stable cardiac size. Stable right hilar prominence is  noted. Right-sided chest tube has been removed. Due to overlying extensive subcutaneous emphysema, it is difficult to assess for the presence or absence of pneumothorax. Left lung is unremarkable. Stable right basilar opacity is noted concerning for atelectasis or edema with probable small pleural effusion. Bony thorax is unremarkable. IMPRESSION: Right-sided chest tube has been removed. However, due to extensive overlying subcutaneous emphysema, it is difficult to assess for the absence or presence of pneumothorax. Electronically Signed   By: Marijo Conception M.D.   On: 01/07/2019 09:17   Dg Chest 2 View  Result Date: 12/27/2018 CLINICAL DATA:  Preoperative evaluation prior to right lobectomy. EXAM: CHEST - 2 VIEW COMPARISON:  Chest radiograph November 23, 2018 FINDINGS: Normal cardiac and mediastinal contours. Subpleural masslike opacity demonstrated within the peripheral aspect of the right upper lobe. No pleural effusion or pneumothorax. Lumbar spinal fusion hardware. Thoracic spine degenerative changes. Cervical spinal fusion hardware. IMPRESSION: Masslike opacity within the peripheral right upper lobe compatible with biopsy proven carcinoma. Electronically Signed   By: Lovey Newcomer M.D.   On: 12/27/2018 14:06   Dg Chest Port 1 View  Result Date: 01/06/2019 CLINICAL DATA:  Right pneumothorax. Chest tube in place. EXAM: PORTABLE CHEST 1 VIEW COMPARISON:  01/05/2019 FINDINGS: There is a persistent right pneumothorax. The small component at the right base has resolved. The right apical component is essentially unchanged. Right chest tube remains in good position. Persistent atelectasis/focal consolidation in the right midzone, unchanged. No discrete effusion. Heart size and vascularity are normal. Slight accentuation of the interstitial markings in the left lung is less prominent. No change in the subcutaneous emphysema on the right. IMPRESSION: 1. No change in the small right apical pneumothorax. Resolution of  the small basal component of the pneumothorax.  2. No change in the subcutaneous emphysema on the right. 3. Slight improvement in the interstitial markings in the left lung. Electronically Signed   By: Lorriane Shire M.D.   On: 01/06/2019 08:54   Dg Chest Port 1 View  Result Date: 01/05/2019 CLINICAL DATA:  68 year old female with a history of pneumonia and pneumothorax status post VATS with right upper lobectomy and right lower lobe wedge resection EXAM: PORTABLE CHEST 1 VIEW COMPARISON:  Multiple prior most recent, serial plain film from September 15-September 20 FINDINGS: Unchanged right-sided thoracostomy tube. Right pneumothorax visualized, with sub pulmonic component. Surgical changes in the right hilum with similar appearance of patchy opacity in the right hilum extending peripherally. Persisting subcutaneous/myofacial emphysema. Reticulonodular opacities in the left mid lung and retrocardiac region. No left pneumothorax. Surgical clips of the left axilla. Surgical changes of the cervical region and lumbar spine IMPRESSION: Recurrence/persisting right pneumothorax, with subpulmonic component. Unchanged thoracostomy tube. Persisting opacity in the right mid lung and to a lesser degree the left lung, potentially atelectasis/consolidation. Unchanged subcutaneous/myofacial emphysema. Electronically Signed   By: Corrie Mckusick D.O.   On: 01/05/2019 08:26   Dg Chest Port 1 View  Result Date: 01/04/2019 CLINICAL DATA:  Follow-up post thoracic surgery with right upper lobe resection. EXAM: PORTABLE CHEST 1 VIEW COMPARISON:  01/03/2019 and older exams. FINDINGS: Small right pneumothorax noted on the previous day's study is no longer visualized. Postsurgical changes on the right with mid lung and perihilar anastomosis staples, adjacent surgical vascular clips and hazy opacity in the right mid to lower lung are unchanged from the previous day's study. Prominent interstitial and bronchovascular markings in the left  lung unchanged. Lung consolidation. Right chest tube is stable. Extensive right subcutaneous emphysema is unchanged. IMPRESSION: 1. No residual right pneumothorax. 2. No other change from the previous day's study. Stable right chest tube. Persistent right subcutaneous emphysema. Electronically Signed   By: Lajean Manes M.D.   On: 01/04/2019 05:42   Dg Chest Port 1 View  Result Date: 01/03/2019 CLINICAL DATA:  Reason for exam: pneumothoraxHx Rt lower wedge resection and Rt upper lobectomy on 9/15, HTN, CHF, CAD, Ascending aortic aneurysm, emphysema EXAM: PORTABLE CHEST 1 VIEW COMPARISON:  01/02/2019 and earlier exams. FINDINGS: Small right pneumothorax, best seen along the lateral pleural margin, appears decreased in size from the previous day's exam. Extensive right-sided subcutaneous emphysema has increased. Postsurgical changes with right perihilar pulmonary anastomosis staples right mid to lower hemithorax surgical vascular clips are unchanged. Right-sided chest tube is stable, tip at the right apex. There are interstitial and hazy airspace opacities the lower lungs, greater on the right, which are unchanged. No left pneumothorax. Right internal jugular line has been removed. IMPRESSION: 1. Slight decrease in the size of the small right sided pneumothorax since the previous day's study. 2. Increased right sided subcutaneous emphysema. 3. Removed right internal jugular central venous line. 4. No other change. Electronically Signed   By: Lajean Manes M.D.   On: 01/03/2019 06:07   Dg Chest Port 1 View  Result Date: 01/02/2019 CLINICAL DATA:  Shortness of breath. Right upper lobectomy and right lower lobe wedge resection 12/30/2018. EXAM: PORTABLE CHEST 1 VIEW COMPARISON:  Radiographs 01/01/2019 and 12/31/2018.  CT 10/14/2018. FINDINGS: 0647 hours. Right IJ central venous catheter and right chest tube are unchanged in position. The overall aeration of the right lung has improved with persistent perihilar  opacity. There is a small right-sided pneumothorax with apical and lateral components, increased from the most recent  study. Diffuse right chest wall soft tissue emphysema is again noted. There is no mediastinal shift or pneumomediastinum. The heart size and mediastinal contours are stable. The left lung is clear. IMPRESSION: Interval enlargement of small right-sided pneumothorax with stable right chest wall emphysema. Interval improved right lung aeration. Electronically Signed   By: Richardean Sale M.D.   On: 01/02/2019 09:36   Dg Chest Port 1 View  Result Date: 01/01/2019 CLINICAL DATA:  Chest tube present, status post lobectomy EXAM: PORTABLE CHEST 1 VIEW COMPARISON:  12/31/2018 FINDINGS: There is new atelectasis or consolidation of the right midlung with significant volume loss of the right hemithorax, status post lobectomy. There is a right-sided chest tube in position without significant pneumothorax. There is a significant interval increase in subcutaneous emphysema about the right chest. The left lung is normally aerated. Cardiomegaly. Right neck vascular catheter IMPRESSION: There is new atelectasis or consolidation of the right midlung with significant volume loss of the right hemithorax, status post lobectomy. There is a right-sided chest tube in position without significant pneumothorax. There is a significant interval increase in subcutaneous emphysema about the right chest. The left lung is normally aerated. Electronically Signed   By: Eddie Candle M.D.   On: 01/01/2019 09:28   Dg Chest Port 1 View  Result Date: 12/31/2018 CLINICAL DATA:  Chest tube.  Pneumothorax. EXAM: PORTABLE CHEST 1 VIEW COMPARISON:  12/30/2018 FINDINGS: A right-sided chest tube is in place. There is a small right-sided pneumothorax which appears slightly increased in size from prior study. Postsurgical changes are noted over the right upper lung zone. The right-sided central venous catheter is well positioned. Chronic  lung markings are noted bilaterally. Subcutaneous gas is noted along the patient's right flank and right upper chest wall. IMPRESSION: Small right-sided pneumothorax, slightly increased in size from prior study. Otherwise, stable appearance of the chest. Electronically Signed   By: Constance Holster M.D.   On: 12/31/2018 09:13   Dg Chest Port 1 View  Result Date: 12/30/2018 CLINICAL DATA:  Pneumothorax EXAM: PORTABLE CHEST 1 VIEW COMPARISON:  12/26/2018 FINDINGS: Interval postoperative findings of right thoracotomy with right-sided chest tube in position. There is a tiny, less than 5% right apical pneumothorax. Subcutaneous emphysema about the right chest. The left lung is normally aerated. The heart mediastinum are normal. Right neck vascular catheter. IMPRESSION: Interval postoperative findings of right thoracotomy with right-sided chest tube in position. There is a tiny, less than 5% right apical pneumothorax. Subcutaneous emphysema about the right chest. The left lung is normally aerated. Electronically Signed   By: Eddie Candle M.D.   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 Patient's tumor invaded into the right chest wall, biopsy from which was consistent with small cell. Given these findings, it was recommended the patient receive radiation therapy directed at the right lateral chest. Surgical clips were placed in the area of concern. It was also recommended the patient then proceed with adjuvant chemotherapy. She will receive her chemotherapy at [facility] under the direction of Dr. Delton Coombes. The patient is scheduled for PET scan on 01/26/2019 for further evaluation.  Today, I talked to the patient and daughter about the findings and work-up thus far.  We discussed the natural history of lung cancer and general treatment, highlighting the role of radiotherapy in the management.  We discussed  the available radiation techniques, and focused  on the details of logistics and delivery.  We reviewed the anticipated acute and late sequelae associated with radiation in this setting.  The patient was encouraged to ask questions that I answered to the best of my ability.  A patient consent form was discussed and signed.  We retained a copy for our records.  The patient would like to proceed with radiation and will be scheduled for CT simulation.  PLAN: CT simulation tomorrow, 01/23/2019, at 1 pm. Treatments to begin on 01/28/2019.  Anticipate 2 weeks of radiation therapy.    ------------------------------------------------  Blair Promise, PhD, MD  This document serves as a record of services personally performed by Gery Pray, MD. It was created on his behalf by Wilburn Mylar, a trained medical scribe. The creation of this record is based on the scribe's personal observations and the provider's statements to them. This document has been checked and approved by the attending provider.

## 2019-01-23 ENCOUNTER — Ambulatory Visit
Admission: RE | Admit: 2019-01-23 | Discharge: 2019-01-23 | Disposition: A | Discharge status: Home / Self Care | Payer: Medicare Other | Source: Ambulatory Visit | Attending: Radiation Oncology | Admitting: Radiation Oncology

## 2019-01-23 DIAGNOSIS — C3491 Malignant neoplasm of unspecified part of right bronchus or lung: Secondary | ICD-10-CM

## 2019-01-23 DIAGNOSIS — C3411 Malignant neoplasm of upper lobe, right bronchus or lung: Secondary | ICD-10-CM | POA: Diagnosis present

## 2019-01-23 DIAGNOSIS — Z510 Encounter for antineoplastic radiation therapy: Secondary | ICD-10-CM | POA: Diagnosis present

## 2019-01-26 ENCOUNTER — Other Ambulatory Visit: Payer: Self-pay

## 2019-01-26 ENCOUNTER — Encounter (HOSPITAL_COMMUNITY)
Admission: RE | Admit: 2019-01-26 | Discharge: 2019-01-26 | Disposition: A | Discharge status: Home / Self Care | Payer: Medicare Other | Source: Ambulatory Visit | Attending: Hematology | Admitting: Hematology

## 2019-01-26 DIAGNOSIS — C3491 Malignant neoplasm of unspecified part of right bronchus or lung: Secondary | ICD-10-CM | POA: Insufficient documentation

## 2019-01-26 MED ORDER — FLUDEOXYGLUCOSE F - 18 (FDG) INJECTION
6.7800 | Freq: Once | INTRAVENOUS | Status: AC | PRN
  Administered 2019-01-26: 20:00:00 6.78 via INTRAVENOUS

## 2019-01-27 DIAGNOSIS — Z510 Encounter for antineoplastic radiation therapy: Secondary | ICD-10-CM | POA: Diagnosis not present

## 2019-01-27 NOTE — Progress Notes (Signed)
  Radiation Oncology         [phone]) [phone] ________________________________  Name: SERETHA ESTABROOKS MRN: [id number]  Date: 01/23/2019  DOB: 23-Apr-1950  SIMULATION AND TREATMENT PLANNING NOTE    ICD-10-CM   1. 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  Identity was confirmed.  All relevant records and images related to the planned course of therapy were reviewed.  The patient freely provided informed written consent to proceed with treatment after reviewing the details related to the planned course of therapy. The consent form was witnessed and verified by the simulation staff.  Then, the patient was set-up in a stable reproducible  supine position for radiation therapy.  CT images were obtained.  Surface markings were placed.  The CT images were loaded into the planning software.  Then the target and avoidance structures were contoured.  Treatment planning then occurred.  The radiation prescription was entered and confirmed.  Then, I designed and supervised the construction of a total of 5 medically necessary complex treatment devices.  I have requested : 3D Simulation  I have requested a DVH of the following structures: GTV, PTV, heart, lungs, spinal cord, esophagus.  I have ordered:dose calc.  PLAN:  The patient will receive 30 Gy in 10 fractions directed at the operative bed along the right lateral chest wall area.  -----------------------------------  Blair Promise, PhD, MD

## 2019-01-28 ENCOUNTER — Inpatient Hospital Stay (HOSPITAL_BASED_OUTPATIENT_CLINIC_OR_DEPARTMENT_OTHER): Payer: Medicare Other | Admitting: Hematology

## 2019-01-28 ENCOUNTER — Encounter (HOSPITAL_COMMUNITY): Payer: Self-pay | Admitting: Hematology

## 2019-01-28 ENCOUNTER — Ambulatory Visit
Admission: RE | Admit: 2019-01-28 | Discharge: 2019-01-28 | Disposition: A | Discharge status: Home / Self Care | Payer: Medicare Other | Source: Ambulatory Visit | Attending: Radiation Oncology | Admitting: Radiation Oncology

## 2019-01-28 ENCOUNTER — Other Ambulatory Visit: Payer: Self-pay

## 2019-01-28 VITALS — BP 121/76 | HR 83 | Temp 97.5°F | Resp 19 | Wt 106.0 lb

## 2019-01-28 DIAGNOSIS — Z510 Encounter for antineoplastic radiation therapy: Secondary | ICD-10-CM | POA: Diagnosis not present

## 2019-01-28 DIAGNOSIS — D696 Thrombocytopenia, unspecified: Secondary | ICD-10-CM | POA: Diagnosis not present

## 2019-01-28 DIAGNOSIS — C3491 Malignant neoplasm of unspecified part of right bronchus or lung: Secondary | ICD-10-CM

## 2019-01-28 NOTE — Progress Notes (Signed)
  Radiation Oncology         [phone] ________________________________  Name: Ashley [name] MRN: [id number]  Date: 01/28/2019  DOB: 06-May-1950  Simulation Verification Note    ICD-10-CM   1. Small cell lung cancer, right (HCC)  C34.91     Status: outpatient  NARRATIVE: The patient was brought to the treatment unit and placed in the planned treatment position. The clinical setup was verified. Then port films were obtained and uploaded to the radiation oncology medical record software.  The treatment beams were carefully compared against the planned radiation fields. The position location and shape of the radiation fields was reviewed. They targeted volume of tissue appears to be appropriately covered by the radiation beams. Organs at risk appear to be excluded as planned.  Based on my personal review, I approved the simulation verification. The patient's treatment will proceed as planned.  -----------------------------------  Blair Promise, PhD, MD

## 2019-01-28 NOTE — Assessment & Plan Note (Signed)
1.  Small cell lung cancer: -PET scan on 10/24/2018 shows hypermetabolic right upper lobe pulmonary nodules, suspicious for synchronous primary bronchogenic carcinomas. - Core needle biopsy of the right upper lobe on 11/21/2018 consistent with small cell carcinoma. - Right upper lobectomy on 12/30/2018 showing invasive small cell carcinoma, 4.2 cm, involving visceral pleura, 1 out of 3 lymph nodes positive, lymphovascular invasion positive.  Metastatic carcinoma in two 11R lymph nodes present.  Right chest wall biopsy was consistent with small cell carcinoma. - Her case was discussed at tumor board.  Radiation therapy to the chest wall was recommended followed by systemic chemotherapy. - She has started radiation therapy to the chest wall, 30 Gray in 10 fractions on 01/28/2019. - We reviewed results of the PET scan dated 01/26/2019 which showed dense consolidative airspace disease in the posterior right base, markedly hypermetabolic SUV 8.  The loculated anterior pleural fluid in the right hemithorax with adjacent airspace consolidation is hypermetabolic with SUV 4.1.  Hypermetabolic uptake identified along the right lung staple lines.  2.5 cm focus of soft tissue in the right axilla with SUV 2.2.  9 mm short axis precarinal lymph node with SUV 2.5.  12 mm short axis subcarinal lymph node with SUV 3. - I will reach out to Dr. Sondra Come and discussed the findings with him. -I will see her back in 4 weeks for follow-up.  2.  Essential thrombocytosis: - She takes hydroxyurea 500 mg Tuesday through Friday and thousand milligrams on Saturday, Sunday and Monday. - Platelet count last week was 747.  This could be reactive from recent surgery.  3.  Left breast cancer: -Diagnosed in 1995, treated with chemotherapy. -Mammogram on 09/10/2018 with right breast was BI-RADS Category 1.  4.  Melanoma: -She had melanoma resected from left upper thigh in 2015.

## 2019-01-28 NOTE — Patient Instructions (Addendum)
[place] at [facility] Discharge Instructions  You were seen today by Dr. Delton Coombes. He went over your recent scan results. He will see you back in 4 weeks for labs and follow up.   Thank you for choosing [place] at [facility] to provide your oncology and hematology care.  To afford each patient quality time with our provider, please arrive at least 15 minutes before your scheduled appointment time.   If you have a lab appointment with the [facility] please come in thru the  Main Entrance and check in at the main information desk  You need to re-schedule your appointment should you arrive 10 or more minutes late.  We strive to give you quality time with our providers, and arriving late affects you and other patients whose appointments are after yours.  Also, if you no show three or more times for appointments you may be dismissed from the clinic at the providers discretion.     Again, thank you for choosing [facility].  Our hope is that these requests will decrease the amount of time that you wait before being seen by our physicians.       _____________________________________________________________  Should you have questions after your visit to [name], please contact our office at [phone] between the hours of 8:00 a.m. and 4:30 p.m.  Voicemails left after 4:00 p.m. will not be returned until the following business day.  For prescription refill requests, have your pharmacy contact our office and allow 72 hours.    Cancer Center Support Programs:   > Cancer Support Group  2nd Tuesday of the month 1pm-2pm, Journey Room

## 2019-01-28 NOTE — Progress Notes (Signed)
[place], [place] [address]   CLINIC:  Medical Oncology/Hematology  PCP:  [name], NP [address] [place] [address] [phone]   REASON FOR VISIT: Small cell lung cancer, essential thrombocytosis.  CURRENT THERAPY:Radiation followed by chemotherapy.    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 The 3 liver lesions are not hypermetabolic. The radiologist suspects they may be subtly present on prior CT chest from 10/2013, to further reassuring that these are likely benign lesions.   Melanoma ([facility])  07/09/2013 Initial Biopsy   Initial biopsy L upper thigh/buttocks melanoma   07/20/2013 Surgery   Excision L lateral buttock/upper thigh melanoma, clear margins   07/20/2013 Pathology Results   Breslow depth 1.02 mm, pT2a       INTERVAL HISTORY:  Ms. Huntsberry 68 y.o. female seen for follow-up of small cell lung cancer.  She had PET CT scan done.  She reports slight improvement in her energy levels.  Appetite is 25%.  Energy levels are still low.  Numbness in the hands and feet has been stable.  Shortness of breath with activity is reported.  Occasional nausea is also stable.  REVIEW OF SYSTEMS:  Review of Systems  Respiratory: Positive for shortness of breath.   Gastrointestinal: Positive for constipation and nausea.  Neurological: Positive for numbness.  All other systems reviewed and are negative.    PAST MEDICAL/SURGICAL HISTORY:  Past  Medical History:  Diagnosis Date  . 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 Anginal pain ([facility])   . Arthritis   . Ascending aortic aneurysm ([facility])   . Cancer [facility]) 1995   breast, left, mastectomy/chemo  . Chest pain    Possibly cardiac. No evidence of ischemia/injury based upon normal troponin I. Chest discomfort could be tachycardia induced supply demand mismatch.   . CHF (congestive heart failure) ([facility]) 11/17/2015   after surgery   . Colon adenomas   . Coronary artery disease   . DJD (degenerative joint disease)   . Dyspnea    with exertion  . Emphysema of lung ([facility]) 11/17/2015  . Essential hypertension, benign   . GERD (gastroesophageal reflux disease)   . History of hiatal hernia   . Hyperlipidemia   . Hypertension   . Melanoma ([facility]) 01/09/2016  . Osteopenia   . Palpitations   . Pernicious anemia 03/06/2016  . Pernicious anemia   . Pure hypercholesterolemia   . Raynaud's disease   . 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 Thrombocytosis ([facility])    Idiopathic  . Vitamin D deficiency    Past Surgical History:  Procedure Laterality Date  . ABDOMINAL HYSTERECTOMY    . ANTERIOR AND POSTERIOR REPAIR N/A 12/09/2014   Procedure: ANTERIOR (CYSTOCELE) AND POSTERIOR REPAIR (RECTOCELE);  Surgeon: Bjorn Loser, MD;  Location: [facility];  Service: Urology;  Laterality: N/A;  . ANTERIOR CERVICAL DECOMPRESSION/DISCECTOMY FUSION 4 LEVELS Right 10/03/2016   Procedure: ANTERIOR CERVICAL DECOMPRESSION FUSION, CERVICAL  4-5, CERVICAL 5-6, CERVICAL 6-7, CERVICAL 7 TO THORACIC 1 WITH INSTRUMENTATION AND ALLOGRAFT;  Surgeon: Phylliss Bob, MD;  Location: [facility];  Service: Orthopedics;  Laterality: Right;  ANTERIOR CERVICAL DECOMPRESSION FUSION, CERVICAL 4-5, CERVICAL 5-6, CERVICAL 6-7, CERVICAL 7 TO THORACIC 1 WITH INSTRUMENTATION AND ALLOGRAFT; REQUEST 4 HO  . ANTERIOR LAT LUMBAR FUSION Left 11/16/2015   Procedure: LEFT SIDED LATERAL INTERBODY FUSION, LUMBAR 2-3, LUMBAR 3-4, LUMBAR 4-5  WITH INSTRUMENTATION;  Surgeon: Phylliss Bob, MD;  Location: [facility];  Service: Orthopedics;  Laterality: Left;  LEFT SIDED LATEARL INTERBODY FUSION, LUMBAR 2-3, LUMBAR 3-4, LUMBAR 4-5 WITH INSTRUMENTATION   . APPENDECTOMY    . BACK SURGERY    . BONE MARROW ASPIRATION  07/2012  . BONE MARROW BIOPSY  07/2012  . BREAST SURGERY    . CARDIAC CATHETERIZATION    . CARDIAC CATHETERIZATION N/A 01/20/2016   Procedure: Left Heart Cath and Coronary Angiography;  Surgeon: Burnell Blanks, MD;  Location: [facility];  Service: Cardiovascular;  Laterality: N/A;  . COLONOSCOPY  11/29/2010   Procedure: COLONOSCOPY;  Surgeon: Rogene Houston, MD;  [facility];  Service: Endoscopy;  Laterality: N/A;  . COLONOSCOPY N/A 02/18/2014   Procedure: COLONOSCOPY;  Surgeon: Rogene Houston, MD;  [facility];  Service: Endoscopy;  Laterality: N/A;  1030  . COLONOSCOPY N/A 02/25/2017   Procedure: COLONOSCOPY;  Surgeon: Rogene Houston, MD;  [facility];  Service: Endoscopy;  Laterality: N/A;  10:55  . CYSTOSCOPY N/A 12/09/2014   Procedure: CYSTOSCOPY;  Surgeon: Bjorn Loser, MD;  Location: [facility];  Service: Urology;  Laterality: N/A;  . ESOPHAGEAL DILATION N/A 02/25/2017   Procedure: ESOPHAGEAL DILATION;  Surgeon: Rogene Houston, MD;  [facility];  Service: Endoscopy;  Laterality: N/A;  . ESOPHAGOGASTRODUODENOSCOPY N/A 02/25/2017   Procedure: ESOPHAGOGASTRODUODENOSCOPY (EGD);  Surgeon: Rogene Houston, MD;  [facility];  Service: Endoscopy;  Laterality: N/A;  . IR GENERIC HISTORICAL  01/11/2016   IR RADIOLOGY PERIPHERAL GUIDED IV START 01/11/2016 Saverio Danker, PA-C [facility]  . IR GENERIC HISTORICAL  01/11/2016   IR US GUIDE VASC ACCESS RIGHT 01/11/2016 Saverio Danker, PA-C [facility]  . LYMPH NODE DISSECTION Right 12/30/2018   Procedure: Lymph Node Dissection;  Surgeon: Lajuana Matte, MD;  Location: [facility];  Service: Thoracic;   Laterality: Right;  . MASTECTOMY     left  . OVARIAN CYST SURGERY     x2  . POLYPECTOMY  02/25/2017   Procedure: POLYPECTOMY;  Surgeon: Rogene Houston, MD;  [facility];  Service: Endoscopy;;  . SALPINGOOPHORECTOMY Bilateral 12/09/2014   Procedure: SALPINGO OOPHORECTOMY;  Surgeon: Servando Salina, MD;  Location: [facility];  Service: Gynecology;  Laterality: Bilateral;  . TUBAL LIGATION    . VAGINAL HYSTERECTOMY N/A 12/09/2014   Procedure: HYSTERECTOMY VAGINAL;  Surgeon: Servando Salina, MD;  Location: [facility];  Service: Gynecology;  Laterality: N/A;  . VIDEO ASSISTED THORACOSCOPY (VATS)/ LOBECTOMY Right 12/30/2018   Procedure: VIDEO ASSISTED THORACOSCOPY (VATS)/RIGHT LOWER LOBE WEDGE RESECTION, RIGHT UPPER LOBECTOMY;  Surgeon: Lajuana Matte, MD;  Location: [facility];  Service: Thoracic;  Laterality: Right;  Marland Kitchen VIDEO BRONCHOSCOPY N/A 12/30/2018   Procedure: VIDEO BRONCHOSCOPY;  Surgeon: Lajuana Matte, MD;  Location: [facility];  Service: Thoracic;  Laterality: N/A;     SOCIAL HISTORY:  Social History   Socioeconomic History  . Marital status: Divorced    Spouse name: Not on file  .  Number of children: 2  . Years of education: Not on file  . Highest education level: Not on file  Occupational History  . Not on file  Social Needs  . Financial resource strain: Not on file  . Food insecurity    Worry: Not on file    Inability: Not on file  . Transportation needs    Medical: Not on file    Non-medical: Not on file  Tobacco Use  . Smoking status: Former Smoker    Packs/day: 0.50    Years: 50.00    Pack years: 25.00    Types: Cigarettes    Quit date: 11/25/2018    Years since quitting: 0.1  . Smokeless tobacco: Never Used  Substance and Sexual Activity  . Alcohol use: Yes    Comment: occasional  . Drug use: No  . Sexual activity: Yes    Birth control/protection: Post-menopausal  Lifestyle  . Physical activity    Days per week: Not on file    Minutes per  session: Not on file  . Stress: Not on file  Relationships  . Social Herbalist on phone: Not on file    Gets together: Not on file    Attends religious service: Not on file    Active member of club or organization: Not on file    Attends meetings of clubs or organizations: Not on file    Relationship status: Not on file  . Intimate partner violence    Fear of current or ex partner: Not on file    Emotionally abused: Not on file    Physically abused: Not on file    Forced sexual activity: Not on file  Other Topics Concern  . Not on file  Social History Narrative  . Not on file    FAMILY HISTORY:  Family History  Problem Relation Age of Onset  . Hypertension Mother   . Heart failure Mother   . Congestive Heart Failure Mother   . COPD Mother   . Pernicious anemia Mother   . Cancer Mother        lung  . Hypertension Father   . CAD Father   . Heart attack Father   . Hypertension Sister   . Cancer Other   . Celiac disease Other     CURRENT MEDICATIONS:  Outpatient Encounter Medications as of 01/28/2019  Medication Sig  . amLODipine (NORVASC) 2.5 MG tablet Take 1 tablet (2.5 mg total) by mouth daily.  Marland Kitchen aspirin EC 81 MG tablet Take 81 mg by mouth daily.  . Calcium Carb-Cholecalciferol (CALCIUM 600+D) 600-800 MG-UNIT TABS Take 1 tablet by mouth 2 (two) times daily.  . Cholecalciferol (VITAMIN D-3) 1000 units CAPS Take 1,000 Units daily by mouth.   . clonazePAM (KLONOPIN) 0.5 MG tablet Take 1 tablet (0.5 mg total) by mouth at bedtime. If take this at bedtime, do NOT take an Oxycodone at night  . cyanocobalamin (,VITAMIN B-12,) 1000 MCG/ML injection INJECT 1 ML INTO THE MUSCLE ONCE MONTHLY AS DIRECTED. (Patient taking differently: Inject 1,000 mcg into the muscle every 30 (thirty) days. )  . gabapentin (NEURONTIN) 300 MG capsule Take 300 mg by mouth at bedtime.  . hydroxyurea (HYDREA) 500 MG capsule TAKE 1 CAPSULE DAILY. Please contact medical doctor who prescribed  this as creatinine elevated at 1.52 on 01/07/2019.  . ibandronate (BONIVA) 150 MG tablet Take 150 mg by mouth every 30 (thirty) days.   . pantoprazole (PROTONIX) 40 MG tablet Take  40 mg daily by mouth.  . rosuvastatin (CRESTOR) 40 MG tablet Take 40 mg at bedtime by mouth.   Marland Kitchen albuterol (PROVENTIL HFA;VENTOLIN HFA) 108 (90 Base) MCG/ACT inhaler Inhale 2 puffs into the lungs every 6 (six) hours as needed for wheezing or shortness of breath. (Patient not taking: Reported on 01/20/2019)  . guaiFENesin (MUCINEX) 600 MG 12 hr tablet Take 1 tablet (600 mg total) by mouth 2 (two) times daily as needed for cough or to loosen phlegm. (Patient not taking: Reported on 01/28/2019)  . ibuprofen (ADVIL,MOTRIN) 800 MG tablet Take 800 mg every 8 (eight) hours as needed by mouth for moderate pain.  Marland Kitchen ipratropium-albuterol (DUONEB) 0.5-2.5 (3) MG/3ML SOLN Take 3 mLs by nebulization every 6 (six) hours as needed (shortness of breath). (Patient not taking: Reported on 01/20/2019)  . methocarbamol (ROBAXIN) 500 MG tablet Take 500 mg by mouth every 6 (six) hours as needed for muscle spasms.   . ondansetron (ZOFRAN) 8 MG tablet Take 1 tablet (8 mg total) by mouth every 8 (eight) hours as needed for nausea or vomiting. (Patient not taking: Reported on 01/20/2019)  . oxyCODONE (OXY IR/ROXICODONE) 5 MG immediate release tablet Take 1 tablet (5 mg total) by mouth every 4 (four) hours as needed for moderate pain. (Patient not taking: Reported on 01/20/2019)  . polyethylene glycol (MIRALAX / GLYCOLAX) 17 g packet Take 17 g by mouth daily as needed for mild constipation.   No facility-administered encounter medications on file as of 01/28/2019.     ALLERGIES:  Allergies  Allergen Reactions  . Penicillins Hives and Rash    Did it involve swelling of the face/tongue/throat, SOB, or low BP? No Did it involve sudden or severe rash/hives, skin peeling, or any reaction on the inside of your mouth or nose? No Did you need to seek  medical attention at a hospital or doctor's office? No When did it last happen?5-10 year If all above answers are "NO", may proceed with cephalosporin use.    . Tape Rash    Medipore, Coban, and paper tape CAN be tolerated     PHYSICAL EXAM:  ECOG Performance status: 1  Vitals:   01/28/19 1607  BP: 121/76  Pulse: 83  Resp: 19  Temp: (!) 97.5 F (36.4 C)  SpO2: 99%   Filed Weights   01/28/19 1607  Weight: 106 lb (48.1 kg)    Physical Exam Constitutional:      Appearance: Normal appearance. She is normal weight.  Cardiovascular:     Rate and Rhythm: Normal rate and regular rhythm.     Heart sounds: Normal heart sounds.  Pulmonary:     Effort: Pulmonary effort is normal.     Breath sounds: Normal breath sounds.  Abdominal:     General: Abdomen is flat. There is no distension.     Palpations: Abdomen is soft. There is no mass.  Musculoskeletal: Normal range of motion.  Skin:    General: Skin is warm and dry.  Neurological:     Mental Status: She is alert and oriented to person, place, and time. Mental status is at baseline.  Psychiatric:        Mood and Affect: Mood normal.        Behavior: Behavior normal.        Thought Content: Thought content normal.        Judgment: Judgment normal.      LABORATORY DATA:  I have reviewed the labs as listed.  CBC  Component Value Date/Time   WBC 8.7 01/20/2019 1118   RBC 2.49 (L) 01/20/2019 1118   HGB 8.9 (L) 01/20/2019 1118   HCT 29.1 (L) 01/20/2019 1118   PLT 747 (H) 01/20/2019 1118   MCV 116.9 (H) 01/20/2019 1118   MCH 35.7 (H) 01/20/2019 1118   MCHC 30.6 01/20/2019 1118   RDW 16.9 (H) 01/20/2019 1118   LYMPHSABS 1.0 01/20/2019 1118   MONOABS 0.8 01/20/2019 1118   EOSABS 0.3 01/20/2019 1118   BASOSABS 0.1 01/20/2019 1118   CMP Latest Ref Rng & Units 01/20/2019 01/07/2019 01/06/2019  Glucose 70 - 99 mg/dL 122(H) 100(H) 96  BUN 8 - 23 mg/dL _0 Creatinine 0.44 - 1.00 mg/dL 1.32(H) 1.52(H) 1.56(H)   Sodium 135 - 145 mmol/L 134(L) 137 137  Potassium 3.5 - 5.1 mmol/L 4.8 2.8(L) 3.7  Chloride 98 - 111 mmol/L 106 109 110  CO2 22 - 32 mmol/L 22 20(L) 20(L)  Calcium 8.9 - 10.3 mg/dL 9.3 8.1(L) 8.0(L)  Total Protein 6.5 - 8.1 g/dL 6.8 - -  Total Bilirubin 0.3 - 1.2 mg/dL 0.5 - -  Alkaline Phos 38 - 126 U/L 89 - -  AST 15 - 41 U/L 40 - -  ALT 0 - 44 U/L 24 - -       DIAGNOSTIC IMAGING:  I have independently reviewed the scans and discussed with the patient.     ASSESSMENT & PLAN:   Small cell lung cancer, right ([facility]) 1.  Small cell lung cancer: -PET scan on 10/24/2018 shows hypermetabolic right upper lobe pulmonary nodules, suspicious for synchronous primary bronchogenic carcinomas. - Core needle biopsy of the right upper lobe on 11/21/2018 consistent with small cell carcinoma. - Right upper lobectomy on 12/30/2018 showing invasive small cell carcinoma, 4.2 cm, involving visceral pleura, 1 out of 3 lymph nodes positive, lymphovascular invasion positive.  Metastatic carcinoma in two 11R lymph nodes present.  Right chest wall biopsy was consistent with small cell carcinoma. - Her case was discussed at tumor board.  Radiation therapy to the chest wall was recommended followed by systemic chemotherapy. - She has started radiation therapy to the chest wall, 30 Gray in 10 fractions on 01/28/2019. - We reviewed results of the PET scan dated 01/26/2019 which showed dense consolidative airspace disease in the posterior right base, markedly hypermetabolic SUV 8.  The loculated anterior pleural fluid in the right hemithorax with adjacent airspace consolidation is hypermetabolic with SUV 4.1.  Hypermetabolic uptake identified along the right lung staple lines.  2.5 cm focus of soft tissue in the right axilla with SUV 2.2.  9 mm short axis precarinal lymph node with SUV 2.5.  12 mm short axis subcarinal lymph node with SUV 3. - I will reach out to Dr. Sondra Come and discussed the findings with him. -I will  see her back in 4 weeks for follow-up.  2.  Essential thrombocytosis: - She takes hydroxyurea 500 mg Tuesday through Friday and thousand milligrams on Saturday, Sunday and Monday. - Platelet count last week was 747.  This could be reactive from recent surgery.  3.  Left breast cancer: -Diagnosed in 1995, treated with chemotherapy. -Mammogram on 09/10/2018 with right breast was BI-RADS Category 1.  4.  Melanoma: -She had melanoma resected from left upper thigh in 2015.  Total time spent is 25 minutes with more than 50% of the time spent face-to-face discussing treatment plan, counseling and coordination of care.  Orders placed this encounter:  Orders Placed This Encounter  Procedures  . CBC with Differential/Platelet  . Comprehensive metabolic panel      Derek Jack, MD [place] [phone]

## 2019-01-29 ENCOUNTER — Ambulatory Visit (INDEPENDENT_AMBULATORY_CARE_PROVIDER_SITE_OTHER): Payer: Medicare Other | Admitting: General Surgery

## 2019-01-29 ENCOUNTER — Encounter: Payer: Self-pay | Admitting: General Surgery

## 2019-01-29 ENCOUNTER — Ambulatory Visit
Admission: RE | Admit: 2019-01-29 | Discharge: 2019-01-29 | Disposition: A | Discharge status: Home / Self Care | Payer: Medicare Other | Source: Ambulatory Visit | Attending: Radiation Oncology | Admitting: Radiation Oncology

## 2019-01-29 ENCOUNTER — Other Ambulatory Visit: Payer: Self-pay

## 2019-01-29 VITALS — BP 121/83 | HR 73 | Temp 96.0°F | Resp 18 | Ht 62.0 in | Wt 107.0 lb

## 2019-01-29 DIAGNOSIS — C3491 Malignant neoplasm of unspecified part of right bronchus or lung: Secondary | ICD-10-CM

## 2019-01-29 DIAGNOSIS — Z510 Encounter for antineoplastic radiation therapy: Secondary | ICD-10-CM | POA: Diagnosis not present

## 2019-01-29 NOTE — Patient Instructions (Signed)

## 2019-01-29 NOTE — H&P (Signed)
Ashley [name]; [id number]; 1950-10-04   HPI Patient is a 68 year old white female who was referred to my care by Dr. Delton Coombes for Port-A-Cath placement.  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  She has had a port before in the remote past for breast cancer.  She currently has no pain.  She is undergoing radiation therapy currently.  That will finish up later this month. Past Medical History:  Diagnosis Date  . 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 Anginal pain ([place])   . Arthritis   . Ascending aortic aneurysm ([place])   . Cancer [facility]) 1995   breast, left, mastectomy/chemo  . Chest pain    Possibly cardiac. No evidence of ischemia/injury based upon normal troponin I. Chest discomfort could be tachycardia induced supply demand mismatch.   . CHF (congestive heart failure) ([place]) 11/17/2015   after surgery   . Colon adenomas   . Coronary artery disease   . DJD (degenerative joint disease)   . Dyspnea    with exertion  . Emphysema of lung ([place]) 11/17/2015  . Essential hypertension, benign   . GERD (gastroesophageal reflux disease)   . History of hiatal hernia   . Hyperlipidemia   . Hypertension   . Melanoma ([place]) 01/09/2016  . Osteopenia   . Palpitations   . Pernicious anemia 03/06/2016  . Pernicious anemia   . Pure hypercholesterolemia   . Raynaud's disease   . Thrombocythemia, essential ([place]) 01/09/2016  . Thrombocytosis ([facility])    Idiopathic  . Vitamin D deficiency     Past Surgical History:  Procedure Laterality Date  . ABDOMINAL HYSTERECTOMY    . ANTERIOR AND POSTERIOR REPAIR N/A 12/09/2014   Procedure: ANTERIOR (CYSTOCELE) AND POSTERIOR REPAIR (RECTOCELE);  Surgeon: Bjorn Loser, MD;  Location: [facility];  Service: Urology;  Laterality: N/A;  . ANTERIOR CERVICAL DECOMPRESSION/DISCECTOMY FUSION 4 LEVELS Right 10/03/2016   Procedure: ANTERIOR CERVICAL DECOMPRESSION FUSION, CERVICAL 4-5, CERVICAL 5-6, CERVICAL  6-7, CERVICAL 7 TO THORACIC 1 WITH INSTRUMENTATION AND ALLOGRAFT;  Surgeon: Phylliss Bob, MD;  Location: [facility];  Service: Orthopedics;  Laterality: Right;  ANTERIOR CERVICAL DECOMPRESSION FUSION, CERVICAL 4-5, CERVICAL 5-6, CERVICAL 6-7, CERVICAL 7 TO THORACIC 1 WITH INSTRUMENTATION AND ALLOGRAFT; REQUEST 4 HO  . ANTERIOR LAT LUMBAR FUSION Left 11/16/2015   Procedure: LEFT SIDED LATERAL INTERBODY FUSION, LUMBAR 2-3, LUMBAR 3-4, LUMBAR 4-5 WITH INSTRUMENTATION;  Surgeon: Phylliss Bob, MD;  Location: [place];  Service: Orthopedics;  Laterality: Left;  LEFT SIDED LATEARL INTERBODY FUSION, LUMBAR 2-3, LUMBAR 3-4, LUMBAR 4-5 WITH INSTRUMENTATION   . APPENDECTOMY    . BACK SURGERY    . BONE MARROW ASPIRATION  07/2012  . BONE MARROW BIOPSY  07/2012  . BREAST SURGERY    . CARDIAC CATHETERIZATION    . CARDIAC CATHETERIZATION N/A 01/20/2016   Procedure: Left Heart Cath and Coronary Angiography;  Surgeon: Burnell Blanks, MD;  Location: [place] [facility];  Service: Cardiovascular;  Laterality: N/A;  . COLONOSCOPY  11/29/2010   Procedure: COLONOSCOPY;  Surgeon: Rogene Houston, MD;  [facility];  Service: Endoscopy;  Laterality: N/A;  . COLONOSCOPY N/A 02/18/2014   Procedure: COLONOSCOPY;  Surgeon: Rogene Houston, MD;  [facility];  Service: Endoscopy;  Laterality: N/A;  1030  . COLONOSCOPY N/A 02/25/2017   Procedure: COLONOSCOPY;  Surgeon: Rogene Houston, MD;  [facility];  Service: Endoscopy;  Laterality: N/A;  10:55  .  CYSTOSCOPY N/A 12/09/2014   Procedure: CYSTOSCOPY;  Surgeon: Bjorn Loser, MD;  Location: [facility];  Service: Urology;  Laterality: N/A;  . ESOPHAGEAL DILATION N/A 02/25/2017   Procedure: ESOPHAGEAL DILATION;  Surgeon: Rogene Houston, MD;  [facility];  Service: Endoscopy;  Laterality: N/A;  . ESOPHAGOGASTRODUODENOSCOPY N/A 02/25/2017   Procedure: ESOPHAGOGASTRODUODENOSCOPY (EGD);  Surgeon: Rogene Houston, MD;  [facility];  Service: Endoscopy;  Laterality: N/A;  . IR GENERIC HISTORICAL  01/11/2016   IR RADIOLOGY PERIPHERAL GUIDED IV START 01/11/2016 Saverio Danker, PA-C [facility]  . IR GENERIC HISTORICAL  01/11/2016   IR US GUIDE VASC ACCESS RIGHT 01/11/2016 Saverio Danker, PA-C [facility]  . LYMPH NODE DISSECTION Right 12/30/2018   Procedure: Lymph Node Dissection;  Surgeon: Lajuana Matte, MD;  Location: [facility];  Service: Thoracic;  Laterality: Right;  . MASTECTOMY     left  . OVARIAN CYST SURGERY     x2  . POLYPECTOMY  02/25/2017   Procedure: POLYPECTOMY;  Surgeon: Rogene Houston, MD;  [facility];  Service: Endoscopy;;  . SALPINGOOPHORECTOMY Bilateral 12/09/2014   Procedure: SALPINGO OOPHORECTOMY;  Surgeon: Servando Salina, MD;  Location: [facility];  Service: Gynecology;  Laterality: Bilateral;  . TUBAL LIGATION    . VAGINAL HYSTERECTOMY N/A 12/09/2014   Procedure: HYSTERECTOMY VAGINAL;  Surgeon: Servando Salina, MD;  Location: [facility];  Service: Gynecology;  Laterality: N/A;  . VIDEO ASSISTED THORACOSCOPY (VATS)/ LOBECTOMY Right 12/30/2018   Procedure: VIDEO ASSISTED THORACOSCOPY (VATS)/RIGHT LOWER LOBE WEDGE RESECTION, RIGHT UPPER LOBECTOMY;  Surgeon: Lajuana Matte, MD;  Location: [facility];  Service: Thoracic;  Laterality: Right;  Marland Kitchen VIDEO BRONCHOSCOPY N/A 12/30/2018   Procedure: VIDEO BRONCHOSCOPY;  Surgeon: Lajuana Matte, MD;  [facility];  Service: Thoracic;  Laterality: N/A;    Family History  Problem Relation Age of Onset  . Hypertension Mother   . Heart failure Mother   . Congestive Heart Failure Mother   . COPD Mother   . Pernicious anemia Mother   . Cancer Mother        lung  . Hypertension Father   . CAD Father   . Heart attack Father   . Hypertension Sister   . Cancer Other   . Celiac disease Other     Current Outpatient Medications on File Prior to Visit  Medication Sig Dispense Refill  . amLODipine (NORVASC) 2.5 MG tablet Take 1 tablet  (2.5 mg total) by mouth daily.    Marland Kitchen aspirin EC 81 MG tablet Take 81 mg by mouth daily.    . Calcium Carb-Cholecalciferol (CALCIUM 600+D) 600-800 MG-UNIT TABS Take 1 tablet by mouth 2 (two) times daily.    . Cholecalciferol (VITAMIN D-3) 1000 units CAPS Take 1,000 Units daily by mouth.     . clonazePAM (KLONOPIN) 0.5 MG tablet Take 1 tablet (0.5 mg total) by mouth at bedtime. If take this at bedtime, do NOT take an Oxycodone at night 90 tablet 2  . cyanocobalamin (,VITAMIN B-12,) 1000 MCG/ML injection INJECT 1 ML INTO THE MUSCLE ONCE MONTHLY AS DIRECTED. (Patient taking differently: Inject 1,000 mcg into the muscle every 30 (thirty) days. ) 1 mL 5  . gabapentin (NEURONTIN) 300 MG capsule Take 300 mg by mouth at bedtime.    . hydroxyurea (HYDREA) 500 MG capsule TAKE 1 CAPSULE DAILY. Please contact medical doctor who prescribed this as creatinine elevated at 1.52 on 01/07/2019. 120 capsule 4  . ibandronate (BONIVA) 150  MG tablet Take 150 mg by mouth every 30 (thirty) days.     Marland Kitchen ibuprofen (ADVIL,MOTRIN) 800 MG tablet Take 800 mg every 8 (eight) hours as needed by mouth for moderate pain.    Marland Kitchen ipratropium-albuterol (DUONEB) 0.5-2.5 (3) MG/3ML SOLN Take 3 mLs by nebulization every 6 (six) hours as needed (shortness of breath). 360 mL 0  . methocarbamol (ROBAXIN) 500 MG tablet Take 500 mg by mouth every 6 (six) hours as needed for muscle spasms.     . pantoprazole (PROTONIX) 40 MG tablet Take 40 mg daily by mouth.    . polyethylene glycol (MIRALAX / GLYCOLAX) 17 g packet Take 17 g by mouth daily as needed for mild constipation.    . rosuvastatin (CRESTOR) 40 MG tablet Take 40 mg at bedtime by mouth.     Marland Kitchen albuterol (PROVENTIL HFA;VENTOLIN HFA) 108 (90 Base) MCG/ACT inhaler Inhale 2 puffs into the lungs every 6 (six) hours as needed for wheezing or shortness of breath. (Patient not taking: Reported on 01/20/2019) 1 Inhaler 2  . guaiFENesin (MUCINEX) 600 MG 12 hr tablet Take 1 tablet (600 mg total) by mouth 2  (two) times daily as needed for cough or to loosen phlegm. (Patient not taking: Reported on 01/28/2019)    . ondansetron (ZOFRAN) 8 MG tablet Take 1 tablet (8 mg total) by mouth every 8 (eight) hours as needed for nausea or vomiting. (Patient not taking: Reported on 01/20/2019) 20 tablet 5  . oxyCODONE (OXY IR/ROXICODONE) 5 MG immediate release tablet Take 1 tablet (5 mg total) by mouth every 4 (four) hours as needed for moderate pain. (Patient not taking: Reported on 01/20/2019) 28 tablet 0   No current facility-administered medications on file prior to visit.     Allergies  Allergen Reactions  . Penicillins Hives and Rash    Did it involve swelling of the face/tongue/throat, SOB, or low BP? No Did it involve sudden or severe rash/hives, skin peeling, or any reaction on the inside of your mouth or nose? No Did you need to seek medical attention at a hospital or doctor's office? No When did it last happen?5-10 year If all above answers are "NO", may proceed with cephalosporin use.    . Tape Rash    Medipore, Coban, and paper tape CAN be tolerated    Social History   Substance and Sexual Activity  Alcohol Use Yes   Comment: occasional    Social History   Tobacco Use  Smoking Status Former Smoker  . Packs/day: 0.50  . Years: 50.00  . Pack years: 25.00  . Types: Cigarettes  . Quit date: 11/25/2018  . Years since quitting: 0.1  Smokeless Tobacco Never Used    Review of Systems  Constitutional: Negative.   HENT: Negative.   Eyes: Positive for blurred vision.  Respiratory: Positive for shortness of breath.   Cardiovascular: Negative.   Gastrointestinal: Positive for heartburn.  Genitourinary: Negative.   Musculoskeletal: Positive for back pain, joint pain and neck pain.  Skin: Negative.   Neurological: Negative.   Endo/Heme/Allergies: Negative.   Psychiatric/Behavioral: Negative.     Objective   Vitals:   01/29/19 0921  BP: 121/83  Pulse: 73  Resp: 18   Temp: (!) 96 F (35.6 C)  SpO2: 96%    Physical Exam Vitals signs reviewed.  Constitutional:      Appearance: Normal appearance. She is normal weight. She is not ill-appearing.  HENT:     Head: Normocephalic and atraumatic.  Cardiovascular:  Rate and Rhythm: Normal rate and regular rhythm.     Heart sounds: Normal heart sounds. No murmur. No friction rub. No gallop.   Pulmonary:     Effort: Pulmonary effort is normal. No respiratory distress.     Breath sounds: Normal breath sounds. No stridor. No wheezing, rhonchi or rales.  Skin:    General: Skin is dry.  Neurological:     Mental Status: She is alert and oriented to person, place, and time.    Oncology notes reviewed Assessment  Small cell carcinoma of right lung, need for central venous access Plan   Patient scheduled for Port-A-Cath insertion on 02/16/2019.  The risks and benefits of the procedure including bleeding, infection, and pneumothorax were fully explained to the patient, who gave informed consent.

## 2019-01-29 NOTE — Progress Notes (Signed)
Ashley [name]; [id number]; 1950-10-04   HPI Patient is a 68 year old white female who was referred to my care by Dr. Delton Coombes for Port-A-Cath placement.  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  She has had a port before in the remote past for breast cancer.  She currently has no pain.  She is undergoing radiation therapy currently.  That will finish up later this month. Past Medical History:  Diagnosis Date  . 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 Anginal pain ([place])   . Arthritis   . Ascending aortic aneurysm ([place])   . Cancer [facility]) 1995   breast, left, mastectomy/chemo  . Chest pain    Possibly cardiac. No evidence of ischemia/injury based upon normal troponin I. Chest discomfort could be tachycardia induced supply demand mismatch.   . CHF (congestive heart failure) ([place]) 11/17/2015   after surgery   . Colon adenomas   . Coronary artery disease   . DJD (degenerative joint disease)   . Dyspnea    with exertion  . Emphysema of lung ([place]) 11/17/2015  . Essential hypertension, benign   . GERD (gastroesophageal reflux disease)   . History of hiatal hernia   . Hyperlipidemia   . Hypertension   . Melanoma ([place]) 01/09/2016  . Osteopenia   . Palpitations   . Pernicious anemia 03/06/2016  . Pernicious anemia   . Pure hypercholesterolemia   . Raynaud's disease   . Thrombocythemia, essential ([place]) 01/09/2016  . Thrombocytosis ([facility])    Idiopathic  . Vitamin D deficiency     Past Surgical History:  Procedure Laterality Date  . ABDOMINAL HYSTERECTOMY    . ANTERIOR AND POSTERIOR REPAIR N/A 12/09/2014   Procedure: ANTERIOR (CYSTOCELE) AND POSTERIOR REPAIR (RECTOCELE);  Surgeon: Bjorn Loser, MD;  Location: [facility];  Service: Urology;  Laterality: N/A;  . ANTERIOR CERVICAL DECOMPRESSION/DISCECTOMY FUSION 4 LEVELS Right 10/03/2016   Procedure: ANTERIOR CERVICAL DECOMPRESSION FUSION, CERVICAL 4-5, CERVICAL 5-6, CERVICAL  6-7, CERVICAL 7 TO THORACIC 1 WITH INSTRUMENTATION AND ALLOGRAFT;  Surgeon: Phylliss Bob, MD;  Location: [facility];  Service: Orthopedics;  Laterality: Right;  ANTERIOR CERVICAL DECOMPRESSION FUSION, CERVICAL 4-5, CERVICAL 5-6, CERVICAL 6-7, CERVICAL 7 TO THORACIC 1 WITH INSTRUMENTATION AND ALLOGRAFT; REQUEST 4 HO  . ANTERIOR LAT LUMBAR FUSION Left 11/16/2015   Procedure: LEFT SIDED LATERAL INTERBODY FUSION, LUMBAR 2-3, LUMBAR 3-4, LUMBAR 4-5 WITH INSTRUMENTATION;  Surgeon: Phylliss Bob, MD;  Location: [facility];  Service: Orthopedics;  Laterality: Left;  LEFT SIDED LATEARL INTERBODY FUSION, LUMBAR 2-3, LUMBAR 3-4, LUMBAR 4-5 WITH INSTRUMENTATION   . APPENDECTOMY    . BACK SURGERY    . BONE MARROW ASPIRATION  07/2012  . BONE MARROW BIOPSY  07/2012  . BREAST SURGERY    . CARDIAC CATHETERIZATION    . CARDIAC CATHETERIZATION N/A 01/20/2016   Procedure: Left Heart Cath and Coronary Angiography;  Surgeon: Burnell Blanks, MD;  Location: [place] [facility];  Service: Cardiovascular;  Laterality: N/A;  . COLONOSCOPY  11/29/2010   Procedure: COLONOSCOPY;  Surgeon: Rogene Houston, MD;  [facility];  Service: Endoscopy;  Laterality: N/A;  . COLONOSCOPY N/A 02/18/2014   Procedure: COLONOSCOPY;  Surgeon: Rogene Houston, MD;  [facility];  Service: Endoscopy;  Laterality: N/A;  1030  . COLONOSCOPY N/A 02/25/2017   Procedure: COLONOSCOPY;  Surgeon: Rogene Houston, MD;  [facility];  Service: Endoscopy;  Laterality: N/A;  10:55  .  CYSTOSCOPY N/A 12/09/2014   Procedure: CYSTOSCOPY;  Surgeon: Bjorn Loser, MD;  Location: [facility];  Service: Urology;  Laterality: N/A;  . ESOPHAGEAL DILATION N/A 02/25/2017   Procedure: ESOPHAGEAL DILATION;  Surgeon: Rogene Houston, MD;  [facility];  Service: Endoscopy;  Laterality: N/A;  . ESOPHAGOGASTRODUODENOSCOPY N/A 02/25/2017   Procedure: ESOPHAGOGASTRODUODENOSCOPY (EGD);  Surgeon: Rogene Houston, MD;  [facility];  Service: Endoscopy;  Laterality: N/A;  . IR GENERIC HISTORICAL  01/11/2016   IR RADIOLOGY PERIPHERAL GUIDED IV START 01/11/2016 Saverio Danker, PA-C [facility]  . IR GENERIC HISTORICAL  01/11/2016   IR US GUIDE VASC ACCESS RIGHT 01/11/2016 Saverio Danker, PA-C [facility]  . LYMPH NODE DISSECTION Right 12/30/2018   Procedure: Lymph Node Dissection;  Surgeon: Lajuana Matte, MD;  Location: [facility];  Service: Thoracic;  Laterality: Right;  . MASTECTOMY     left  . OVARIAN CYST SURGERY     x2  . POLYPECTOMY  02/25/2017   Procedure: POLYPECTOMY;  Surgeon: Rogene Houston, MD;  [facility];  Service: Endoscopy;;  . SALPINGOOPHORECTOMY Bilateral 12/09/2014   Procedure: SALPINGO OOPHORECTOMY;  Surgeon: Servando Salina, MD;  Location: [facility];  Service: Gynecology;  Laterality: Bilateral;  . TUBAL LIGATION    . VAGINAL HYSTERECTOMY N/A 12/09/2014   Procedure: HYSTERECTOMY VAGINAL;  Surgeon: Servando Salina, MD;  Location: [facility];  Service: Gynecology;  Laterality: N/A;  . VIDEO ASSISTED THORACOSCOPY (VATS)/ LOBECTOMY Right 12/30/2018   Procedure: VIDEO ASSISTED THORACOSCOPY (VATS)/RIGHT LOWER LOBE WEDGE RESECTION, RIGHT UPPER LOBECTOMY;  Surgeon: Lajuana Matte, MD;  Location: [facility];  Service: Thoracic;  Laterality: Right;  Marland Kitchen VIDEO BRONCHOSCOPY N/A 12/30/2018   Procedure: VIDEO BRONCHOSCOPY;  Surgeon: Lajuana Matte, MD;  [facility];  Service: Thoracic;  Laterality: N/A;    Family History  Problem Relation Age of Onset  . Hypertension Mother   . Heart failure Mother   . Congestive Heart Failure Mother   . COPD Mother   . Pernicious anemia Mother   . Cancer Mother        lung  . Hypertension Father   . CAD Father   . Heart attack Father   . Hypertension Sister   . Cancer Other   . Celiac disease Other     Current Outpatient Medications on File Prior to Visit  Medication Sig Dispense Refill  . amLODipine (NORVASC) 2.5 MG tablet Take 1 tablet  (2.5 mg total) by mouth daily.    Marland Kitchen aspirin EC 81 MG tablet Take 81 mg by mouth daily.    . Calcium Carb-Cholecalciferol (CALCIUM 600+D) 600-800 MG-UNIT TABS Take 1 tablet by mouth 2 (two) times daily.    . Cholecalciferol (VITAMIN D-3) 1000 units CAPS Take 1,000 Units daily by mouth.     . clonazePAM (KLONOPIN) 0.5 MG tablet Take 1 tablet (0.5 mg total) by mouth at bedtime. If take this at bedtime, do NOT take an Oxycodone at night 90 tablet 2  . cyanocobalamin (,VITAMIN B-12,) 1000 MCG/ML injection INJECT 1 ML INTO THE MUSCLE ONCE MONTHLY AS DIRECTED. (Patient taking differently: Inject 1,000 mcg into the muscle every 30 (thirty) days. ) 1 mL 5  . gabapentin (NEURONTIN) 300 MG capsule Take 300 mg by mouth at bedtime.    . hydroxyurea (HYDREA) 500 MG capsule TAKE 1 CAPSULE DAILY. Please contact medical doctor who prescribed this as creatinine elevated at 1.52 on 01/07/2019. 120 capsule 4  . ibandronate (BONIVA) 150  MG tablet Take 150 mg by mouth every 30 (thirty) days.     Marland Kitchen ibuprofen (ADVIL,MOTRIN) 800 MG tablet Take 800 mg every 8 (eight) hours as needed by mouth for moderate pain.    Marland Kitchen ipratropium-albuterol (DUONEB) 0.5-2.5 (3) MG/3ML SOLN Take 3 mLs by nebulization every 6 (six) hours as needed (shortness of breath). 360 mL 0  . methocarbamol (ROBAXIN) 500 MG tablet Take 500 mg by mouth every 6 (six) hours as needed for muscle spasms.     . pantoprazole (PROTONIX) 40 MG tablet Take 40 mg daily by mouth.    . polyethylene glycol (MIRALAX / GLYCOLAX) 17 g packet Take 17 g by mouth daily as needed for mild constipation.    . rosuvastatin (CRESTOR) 40 MG tablet Take 40 mg at bedtime by mouth.     Marland Kitchen albuterol (PROVENTIL HFA;VENTOLIN HFA) 108 (90 Base) MCG/ACT inhaler Inhale 2 puffs into the lungs every 6 (six) hours as needed for wheezing or shortness of breath. (Patient not taking: Reported on 01/20/2019) 1 Inhaler 2  . guaiFENesin (MUCINEX) 600 MG 12 hr tablet Take 1 tablet (600 mg total) by mouth 2  (two) times daily as needed for cough or to loosen phlegm. (Patient not taking: Reported on 01/28/2019)    . ondansetron (ZOFRAN) 8 MG tablet Take 1 tablet (8 mg total) by mouth every 8 (eight) hours as needed for nausea or vomiting. (Patient not taking: Reported on 01/20/2019) 20 tablet 5  . oxyCODONE (OXY IR/ROXICODONE) 5 MG immediate release tablet Take 1 tablet (5 mg total) by mouth every 4 (four) hours as needed for moderate pain. (Patient not taking: Reported on 01/20/2019) 28 tablet 0   No current facility-administered medications on file prior to visit.     Allergies  Allergen Reactions  . Penicillins Hives and Rash    Did it involve swelling of the face/tongue/throat, SOB, or low BP? No Did it involve sudden or severe rash/hives, skin peeling, or any reaction on the inside of your mouth or nose? No Did you need to seek medical attention at a hospital or doctor's office? No When did it last happen?5-10 year If all above answers are "NO", may proceed with cephalosporin use.    . Tape Rash    Medipore, Coban, and paper tape CAN be tolerated    Social History   Substance and Sexual Activity  Alcohol Use Yes   Comment: occasional    Social History   Tobacco Use  Smoking Status Former Smoker  . Packs/day: 0.50  . Years: 50.00  . Pack years: 25.00  . Types: Cigarettes  . Quit date: 11/25/2018  . Years since quitting: 0.1  Smokeless Tobacco Never Used    Review of Systems  Constitutional: Negative.   HENT: Negative.   Eyes: Positive for blurred vision.  Respiratory: Positive for shortness of breath.   Cardiovascular: Negative.   Gastrointestinal: Positive for heartburn.  Genitourinary: Negative.   Musculoskeletal: Positive for back pain, joint pain and neck pain.  Skin: Negative.   Neurological: Negative.   Endo/Heme/Allergies: Negative.   Psychiatric/Behavioral: Negative.     Objective   Vitals:   01/29/19 0921  BP: 121/83  Pulse: 73  Resp: 18   Temp: (!) 96 F (35.6 C)  SpO2: 96%    Physical Exam Vitals signs reviewed.  Constitutional:      Appearance: Normal appearance. She is normal weight. She is not ill-appearing.  HENT:     Head: Normocephalic and atraumatic.  Cardiovascular:  Rate and Rhythm: Normal rate and regular rhythm.     Heart sounds: Normal heart sounds. No murmur. No friction rub. No gallop.   Pulmonary:     Effort: Pulmonary effort is normal. No respiratory distress.     Breath sounds: Normal breath sounds. No stridor. No wheezing, rhonchi or rales.  Skin:    General: Skin is dry.  Neurological:     Mental Status: She is alert and oriented to person, place, and time.    Oncology notes reviewed Assessment  Small cell carcinoma of right lung, need for central venous access Plan   Patient scheduled for Port-A-Cath insertion on 02/16/2019.  The risks and benefits of the procedure including bleeding, infection, and pneumothorax were fully explained to the patient, who gave informed consent.

## 2019-01-30 ENCOUNTER — Ambulatory Visit
Admission: RE | Admit: 2019-01-30 | Discharge: 2019-01-30 | Disposition: A | Discharge status: Home / Self Care | Payer: Medicare Other | Source: Ambulatory Visit | Attending: Radiation Oncology | Admitting: Radiation Oncology

## 2019-01-30 ENCOUNTER — Other Ambulatory Visit: Payer: Self-pay

## 2019-01-30 DIAGNOSIS — Z510 Encounter for antineoplastic radiation therapy: Secondary | ICD-10-CM | POA: Diagnosis not present

## 2019-02-02 ENCOUNTER — Ambulatory Visit
Admission: RE | Admit: 2019-02-02 | Discharge: 2019-02-02 | Disposition: A | Discharge status: Home / Self Care | Payer: Medicare Other | Source: Ambulatory Visit | Attending: Radiation Oncology | Admitting: Radiation Oncology

## 2019-02-02 ENCOUNTER — Other Ambulatory Visit: Payer: Self-pay

## 2019-02-02 DIAGNOSIS — Z510 Encounter for antineoplastic radiation therapy: Secondary | ICD-10-CM | POA: Diagnosis not present

## 2019-02-03 ENCOUNTER — Ambulatory Visit
Admission: RE | Admit: 2019-02-03 | Discharge: 2019-02-03 | Disposition: A | Discharge status: Home / Self Care | Payer: Medicare Other | Source: Ambulatory Visit | Attending: Radiation Oncology | Admitting: Radiation Oncology

## 2019-02-03 ENCOUNTER — Other Ambulatory Visit: Payer: Self-pay

## 2019-02-03 DIAGNOSIS — Z510 Encounter for antineoplastic radiation therapy: Secondary | ICD-10-CM | POA: Diagnosis not present

## 2019-02-04 ENCOUNTER — Other Ambulatory Visit: Payer: Self-pay

## 2019-02-04 ENCOUNTER — Ambulatory Visit
Admission: RE | Admit: 2019-02-04 | Discharge: 2019-02-04 | Disposition: A | Discharge status: Home / Self Care | Payer: Medicare Other | Source: Ambulatory Visit | Attending: Radiation Oncology | Admitting: Radiation Oncology

## 2019-02-04 DIAGNOSIS — Z510 Encounter for antineoplastic radiation therapy: Secondary | ICD-10-CM | POA: Diagnosis not present

## 2019-02-05 ENCOUNTER — Ambulatory Visit
Admission: RE | Admit: 2019-02-05 | Discharge: 2019-02-05 | Disposition: A | Discharge status: Home / Self Care | Payer: Medicare Other | Source: Ambulatory Visit | Attending: Radiation Oncology | Admitting: Radiation Oncology

## 2019-02-05 ENCOUNTER — Other Ambulatory Visit: Payer: Self-pay

## 2019-02-05 DIAGNOSIS — Z510 Encounter for antineoplastic radiation therapy: Secondary | ICD-10-CM | POA: Diagnosis not present

## 2019-02-06 ENCOUNTER — Other Ambulatory Visit: Payer: Self-pay

## 2019-02-06 ENCOUNTER — Ambulatory Visit
Admission: RE | Admit: 2019-02-06 | Discharge: 2019-02-06 | Disposition: A | Discharge status: Home / Self Care | Payer: Medicare Other | Source: Ambulatory Visit | Attending: Radiation Oncology | Admitting: Radiation Oncology

## 2019-02-06 DIAGNOSIS — Z510 Encounter for antineoplastic radiation therapy: Secondary | ICD-10-CM | POA: Diagnosis not present

## 2019-02-09 ENCOUNTER — Ambulatory Visit
Admission: RE | Admit: 2019-02-09 | Discharge: 2019-02-09 | Disposition: A | Discharge status: Home / Self Care | Payer: Medicare Other | Source: Ambulatory Visit | Attending: Radiation Oncology | Admitting: Radiation Oncology

## 2019-02-09 ENCOUNTER — Other Ambulatory Visit: Payer: Self-pay

## 2019-02-09 DIAGNOSIS — Z510 Encounter for antineoplastic radiation therapy: Secondary | ICD-10-CM | POA: Diagnosis not present

## 2019-02-10 ENCOUNTER — Encounter: Payer: Self-pay | Admitting: Radiation Oncology

## 2019-02-10 ENCOUNTER — Ambulatory Visit
Admission: RE | Admit: 2019-02-10 | Discharge: 2019-02-10 | Disposition: A | Discharge status: Home / Self Care | Payer: Medicare Other | Source: Ambulatory Visit | Attending: Radiation Oncology | Admitting: Radiation Oncology

## 2019-02-10 ENCOUNTER — Other Ambulatory Visit: Payer: Self-pay

## 2019-02-10 DIAGNOSIS — Z510 Encounter for antineoplastic radiation therapy: Secondary | ICD-10-CM | POA: Diagnosis not present

## 2019-02-11 ENCOUNTER — Ambulatory Visit: Payer: Medicare Other

## 2019-02-11 NOTE — Patient Instructions (Signed)
[place]  02/11/2019     @PREFPERIOPPHARMACY @   Your procedure is scheduled on  02/16/2019   Report to [facility] at  [place]  Call this number if you have problems the morning of surgery:  [phone]   Remember:  Do not eat or drink after midnight.                        Take these medicines the morning of surgery with A SIP OF WATER  Hydrea, robaxin(if needed), zofran(if needed). Use your nebulizer and your inhaler before you come and bring your inhaler with you.    Do not wear jewelry, make-up or nail polish.  Do not wear lotions, powders, or perfumes, or deodorant. Please brush your teeth.  Do not shave 48 hours prior to surgery.  Men may shave face and neck.  Do not bring valuables to the hospital.  [facility] is not responsible for any belongings or valuables.  Contacts, dentures or bridgework may not be worn into surgery.  Leave your suitcase in the car.  After surgery it may be brought to your room.  For patients admitted to the hospital, discharge time will be determined by your treatment team.  Patients discharged the day of surgery will not be allowed to drive home.   Name and phone number of your driver:   family Special instructions:  none  Please read over the following fact sheets that you were given. Anesthesia Post-op Instructions and Care and Recovery After Surgery       Implanted Port Insertion, Care After This sheet gives you information about how to care for yourself after your procedure. Your health care provider may also give you more specific instructions. If you have problems or questions, contact your health care provider. What can I expect after the procedure? After the procedure, it is common to have:  Discomfort at the port insertion site.  Bruising on the skin over the port. This should improve over 3-4 days. Follow these instructions at home: [facility] care  After your port is placed, you will get a manufacturer's  information card. The card has information about your port. Keep this card with you at all times.  Take care of the port as told by your health care provider. Ask your health care provider if you or a family member can get training for taking care of the port at home. A home health care nurse may also take care of the port.  Make sure to remember what type of port you have. Incision care      Follow instructions from your health care provider about how to take care of your port insertion site. Make sure you: ? Wash your hands with soap and water before and after you change your bandage (dressing). If soap and water are not available, use hand sanitizer. ? Change your dressing as told by your health care provider. ? Leave stitches (sutures), skin glue, or adhesive strips in place. These skin closures may need to stay in place for 2 weeks or longer. If adhesive strip edges start to loosen and curl up, you may trim the loose edges. Do not remove adhesive strips completely unless your health care provider tells you to do that.  Check your port insertion site every day for signs of infection. Check for: ? Redness, swelling, or pain. ? Fluid or blood. ? Warmth. ?  Pus or a bad smell. Activity  Return to your normal activities as told by your health care provider. Ask your health care provider what activities are safe for you.  Do not lift anything that is heavier than 10 lb (4.5 kg), or the limit that you are told, until your health care provider says that it is safe. General instructions  Take over-the-counter and prescription medicines only as told by your health care provider.  Do not take baths, swim, or use a hot tub until your health care provider approves. Ask your health care provider if you may take showers. You may only be allowed to take sponge baths.  Do not drive for 24 hours if you were given a sedative during your procedure.  Wear a medical alert bracelet in case of an  emergency. This will tell any health care providers that you have a port.  Keep all follow-up visits as told by your health care provider. This is important. Contact a health care provider if:  You cannot flush your port with saline as directed, or you cannot draw blood from the port.  You have a fever or chills.  You have redness, swelling, or pain around your port insertion site.  You have fluid or blood coming from your port insertion site.  Your port insertion site feels warm to the touch.  You have pus or a bad smell coming from the port insertion site. Get help right away if:  You have chest pain or shortness of breath.  You have bleeding from your port that you cannot control. Summary  Take care of the port as told by your health care provider. Keep the manufacturer's information card with you at all times.  Change your dressing as told by your health care provider.  Contact a health care provider if you have a fever or chills or if you have redness, swelling, or pain around your port insertion site.  Keep all follow-up visits as told by your health care provider. This information is not intended to replace advice given to you by your health care provider. Make sure you discuss any questions you have with your health care provider. Document Released: 01/21/2013 Document Revised: 10/29/2017 Document Reviewed: 10/29/2017 Elsevier Patient Education  [place] An implanted port is a device that is placed under the skin. It is usually placed in the chest. The device can be used to give IV medicine, to take blood, or for dialysis. You may have an implanted port if:  You need IV medicine that would be irritating to the small veins in your hands or arms.  You need IV medicines, such as antibiotics, for a long period of time.  You need IV nutrition for a long period of time.  You need dialysis. Having a port means that your health care  provider will not need to use the veins in your arms for these procedures. You may have fewer limitations when using a port than you would if you used other types of long-term IVs, and you will likely be able to return to normal activities after your incision heals. An implanted port has two main parts:  Reservoir. The reservoir is the part where a needle is inserted to give medicines or draw blood. The reservoir is round. After it is placed, it appears as a small, raised area under your skin.  Catheter. The catheter is a thin, flexible tube that connects the reservoir to a vein. Medicine  that is inserted into the reservoir goes into the catheter and then into the vein. How is my port accessed? To access your port:  A numbing cream may be placed on the skin over the port site.  Your health care provider will put on a mask and sterile gloves.  The skin over your port will be cleaned carefully with a germ-killing soap and allowed to dry.  Your health care provider will gently pinch the port and insert a needle into it.  Your health care provider will check for a blood return to make sure the port is in the vein and is not clogged.  If your port needs to remain accessed to get medicine continuously (constant infusion), your health care provider will place a clear bandage (dressing) over the needle site. The dressing and needle will need to be changed every week, or as told by your health care provider. What is flushing? Flushing helps keep the port from getting clogged. Follow instructions from your health care provider about how and when to flush the port. Ports are usually flushed with saline solution or a medicine called heparin. The need for flushing will depend on how the port is used:  If the port is only used from time to time to give medicines or draw blood, the port may need to be flushed: ? Before and after medicines have been given. ? Before and after blood has been drawn. ? As part  of routine maintenance. Flushing may be recommended every 4-6 weeks.  If a constant infusion is running, the port may not need to be flushed.  Throw away any syringes in a disposal container that is meant for sharp items (sharps container). You can buy a sharps container from a pharmacy, or you can make one by using an empty hard plastic bottle with a cover. How long will my port stay implanted? The port can stay in for as long as your health care provider thinks it is needed. When it is time for the port to come out, a surgery will be done to remove it. The surgery will be similar to the procedure that was done to put the port in. Follow these instructions at home:   Flush your port as told by your health care provider.  If you need an infusion over several days, follow instructions from your health care provider about how to take care of your port site. Make sure you: ? Wash your hands with soap and water before you change your dressing. If soap and water are not available, use alcohol-based hand sanitizer. ? Change your dressing as told by your health care provider. ? Place any used dressings or infusion bags into a plastic bag. Throw that bag in the trash. ? Keep the dressing that covers the needle clean and dry. Do not get it wet. ? Do not use scissors or sharp objects near the tube. ? Keep the tube clamped, unless it is being used.  Check your port site every day for signs of infection. Check for: ? Redness, swelling, or pain. ? Fluid or blood. ? Pus or a bad smell.  Protect the skin around the port site. ? Avoid wearing bra straps that rub or irritate the site. ? Protect the skin around your port from seat belts. Place a soft pad over your chest if needed.  Bathe or shower as told by your health care provider. The site may get wet as long as you are not actively receiving an  infusion.  Return to your normal activities as told by your health care provider. Ask your health care  provider what activities are safe for you.  Carry a medical alert card or wear a medical alert bracelet at all times. This will let health care providers know that you have an implanted port in case of an emergency. Get help right away if:  You have redness, swelling, or pain at the port site.  You have fluid or blood coming from your port site.  You have pus or a bad smell coming from the port site.  You have a fever. Summary  Implanted ports are usually placed in the chest for long-term IV access.  Follow instructions from your health care provider about flushing the port and changing bandages (dressings).  Take care of the area around your port by avoiding clothing that puts pressure on the area, and by watching for signs of infection.  Protect the skin around your port from seat belts. Place a soft pad over your chest if needed.  Get help right away if you have a fever or you have redness, swelling, pain, drainage, or a bad smell at the port site. This information is not intended to replace advice given to you by your health care provider. Make sure you discuss any questions you have with your health care provider. Document Released: 04/02/2005 Document Revised: 07/25/2018 Document Reviewed: 05/05/2016 Elsevier Patient Education  2020 [employer].  How to Use Chlorhexidine for Bathing Chlorhexidine gluconate (CHG) is a germ-killing (antiseptic) solution that is used to clean the skin. It can get rid of the bacteria that normally live on the skin and can keep them away for about 24 hours. To clean your skin with CHG, you may be given:  A CHG solution to use in the shower or as part of a sponge bath.  A prepackaged cloth that contains CHG. Cleaning your skin with CHG may help lower the risk for infection:  While you are staying in the intensive care unit of the hospital.  If you have a vascular access, such as a central line, to provide short-term or long-term access to your  veins.  If you have a catheter to drain urine from your bladder.  If you are on a ventilator. A ventilator is a machine that helps you breathe by moving air in and out of your lungs.  After surgery. What are the risks? Risks of using CHG include:  A skin reaction.  Hearing loss, if CHG gets in your ears.  Eye injury, if CHG gets in your eyes and is not rinsed out.  The CHG product catching fire. Make sure that you avoid smoking and flames after applying CHG to your skin. Do not use CHG:  If you have a chlorhexidine allergy or have previously reacted to chlorhexidine.  On babies younger than 35 months of age. How to use CHG solution  Use CHG only as told by your health care provider, and follow the instructions on the label.  Use the full amount of CHG as directed. Usually, this is one bottle. During a shower Follow these steps when using CHG solution during a shower (unless your health care provider gives you different instructions): 1. Start the shower. 2. Use your normal soap and shampoo to wash your face and hair. 3. Turn off the shower or move out of the shower stream. 4. Pour the CHG onto a clean washcloth. Do not use any type of brush or rough-edged sponge.  5. Starting at your neck, lather your body down to your toes. Make sure you follow these instructions: ? If you will be having surgery, pay special attention to the part of your body where you will be having surgery. Scrub this area for at least 1 minute. ? Do not use CHG on your head or face. If the solution gets into your ears or eyes, rinse them well with water. ? Avoid your genital area. ? Avoid any areas of skin that have broken skin, cuts, or scrapes. ? Scrub your back and under your arms. Make sure to wash skin folds. 6. Let the lather sit on your skin for 1-2 minutes or as long as told by your health care provider. 7. Thoroughly rinse your entire body in the shower. Make sure that all body creases and crevices  are rinsed well. 8. Dry off with a clean towel. Do not put any substances on your body afterward-such as powder, lotion, or perfume-unless you are told to do so by your health care provider. Only use lotions that are recommended by the manufacturer. 9. Put on clean clothes or pajamas. 10. If it is the night before your surgery, sleep in clean sheets.  During a sponge bath Follow these steps when using CHG solution during a sponge bath (unless your health care provider gives you different instructions): 1. Use your normal soap and shampoo to wash your face and hair. 2. Pour the CHG onto a clean washcloth. 3. Starting at your neck, lather your body down to your toes. Make sure you follow these instructions: ? If you will be having surgery, pay special attention to the part of your body where you will be having surgery. Scrub this area for at least 1 minute. ? Do not use CHG on your head or face. If the solution gets into your ears or eyes, rinse them well with water. ? Avoid your genital area. ? Avoid any areas of skin that have broken skin, cuts, or scrapes. ? Scrub your back and under your arms. Make sure to wash skin folds. 4. Let the lather sit on your skin for 1-2 minutes or as long as told by your health care provider. 5. Using a different clean, wet washcloth, thoroughly rinse your entire body. Make sure that all body creases and crevices are rinsed well. 6. Dry off with a clean towel. Do not put any substances on your body afterward-such as powder, lotion, or perfume-unless you are told to do so by your health care provider. Only use lotions that are recommended by the manufacturer. 7. Put on clean clothes or pajamas. 8. If it is the night before your surgery, sleep in clean sheets. How to use CHG prepackaged cloths  Only use CHG cloths as told by your health care provider, and follow the instructions on the label.  Use the CHG cloth on clean, dry skin.  Do not use the CHG cloth on  your head or face unless your health care provider tells you to.  When washing with the CHG cloth: ? Avoid your genital area. ? Avoid any areas of skin that have broken skin, cuts, or scrapes. Before surgery Follow these steps when using a CHG cloth to clean before surgery (unless your health care provider gives you different instructions): 1. Using the CHG cloth, vigorously scrub the part of your body where you will be having surgery. Scrub using a back-and-forth motion for 3 minutes. The area on your body should be completely wet  with CHG when you are done scrubbing. 2. Do not rinse. Discard the cloth and let the area air-dry. Do not put any substances on the area afterward, such as powder, lotion, or perfume. 3. Put on clean clothes or pajamas. 4. If it is the night before your surgery, sleep in clean sheets.  For general bathing Follow these steps when using CHG cloths for general bathing (unless your health care provider gives you different instructions). 1. Use a separate CHG cloth for each area of your body. Make sure you wash between any folds of skin and between your fingers and toes. Wash your body in the following order, switching to a new cloth after each step: ? The front of your neck, shoulders, and chest. ? Both of your arms, under your arms, and your hands. ? Your stomach and groin area, avoiding the genitals. ? Your right leg and foot. ? Your left leg and foot. ? The back of your neck, your back, and your buttocks. 2. Do not rinse. Discard the cloth and let the area air-dry. Do not put any substances on your body afterward-such as powder, lotion, or perfume-unless you are told to do so by your health care provider. Only use lotions that are recommended by the manufacturer. 3. Put on clean clothes or pajamas. Contact a health care provider if:  Your skin gets irritated after scrubbing.  You have questions about using your solution or cloth. Get help right away if:  Your  eyes become very red or swollen.  Your eyes itch badly.  Your skin itches badly and is red or swollen.  Your hearing changes.  You have trouble seeing.  You have swelling or tingling in your mouth or throat.  You have trouble breathing.  You swallow any chlorhexidine. Summary  Chlorhexidine gluconate (CHG) is a germ-killing (antiseptic) solution that is used to clean the skin. Cleaning your skin with CHG may help to lower your risk for infection.  You may be given CHG to use for bathing. It may be in a bottle or in a prepackaged cloth to use on your skin. Carefully follow your health care provider's instructions and the instructions on the product label.  Do not use CHG if you have a chlorhexidine allergy.  Contact your health care provider if your skin gets irritated after scrubbing. This information is not intended to replace advice given to you by your health care provider. Make sure you discuss any questions you have with your health care provider. Document Released: 12/26/2011 Document Revised: 06/19/2018 Document Reviewed: 02/28/2017 Elsevier Patient Education  2020 [place] After These instructions provide you with information about caring for yourself after your procedure. Your health care provider may also give you more specific instructions. Your treatment has been planned according to current medical practices, but problems sometimes occur. Call your health care provider if you have any problems or questions after your procedure. What can I expect after the procedure? After your procedure, you may:  Feel sleepy for several hours.  Feel clumsy and have poor balance for several hours.  Feel forgetful about what happened after the procedure.  Have poor judgment for several hours.  Feel nauseous or vomit.  Have a sore throat if you had a breathing tube during the procedure. Follow these instructions at home: For at least 24  hours after the procedure:      Have a responsible adult stay with you. It is important to have someone help care  for you until you are awake and alert.  Rest as needed.  Do not: ? Participate in activities in which you could fall or become injured. ? Drive. ? Use heavy machinery. ? Drink alcohol. ? Take sleeping pills or medicines that cause drowsiness. ? Make important decisions or sign legal documents. ? Take care of children on your own. Eating and drinking  Follow the diet that is recommended by your health care provider.  If you vomit, drink water, juice, or soup when you can drink without vomiting.  Make sure you have little or no nausea before eating solid foods. General instructions  Take over-the-counter and prescription medicines only as told by your health care provider.  If you have sleep apnea, surgery and certain medicines can increase your risk for breathing problems. Follow instructions from your health care provider about wearing your sleep device: ? Anytime you are sleeping, including during daytime naps. ? While taking prescription pain medicines, sleeping medicines, or medicines that make you drowsy.  If you smoke, do not smoke without supervision.  Keep all follow-up visits as told by your health care provider. This is important. Contact a health care provider if:  You keep feeling nauseous or you keep vomiting.  You feel light-headed.  You develop a rash.  You have a fever. Get help right away if:  You have trouble breathing. Summary  For several hours after your procedure, you may feel sleepy and have poor judgment.  Have a responsible adult stay with you for at least 24 hours or until you are awake and alert. This information is not intended to replace advice given to you by your health care provider. Make sure you discuss any questions you have with your health care provider. Document Released: 07/24/2015 Document Revised: 07/01/2017  Document Reviewed: 07/24/2015 Elsevier Patient Education  2020 [employer].

## 2019-02-12 ENCOUNTER — Ambulatory Visit: Payer: Medicare Other

## 2019-02-13 ENCOUNTER — Ambulatory Visit: Payer: Medicare Other

## 2019-02-13 ENCOUNTER — Other Ambulatory Visit (HOSPITAL_COMMUNITY)
Admission: RE | Admit: 2019-02-13 | Discharge: 2019-02-13 | Disposition: A | Discharge status: Home / Self Care | Payer: Medicare Other | Source: Ambulatory Visit | Attending: General Surgery | Admitting: General Surgery

## 2019-02-13 ENCOUNTER — Encounter (HOSPITAL_COMMUNITY)
Admission: RE | Admit: 2019-02-13 | Discharge: 2019-02-13 | Disposition: A | Discharge status: Home / Self Care | Payer: Medicare Other | Source: Ambulatory Visit | Attending: General Surgery | Admitting: General Surgery

## 2019-02-13 ENCOUNTER — Other Ambulatory Visit: Payer: Self-pay

## 2019-02-13 DIAGNOSIS — C3491 Malignant neoplasm of unspecified part of right bronchus or lung: Secondary | ICD-10-CM | POA: Insufficient documentation

## 2019-02-13 DIAGNOSIS — Z01812 Encounter for preprocedural laboratory examination: Secondary | ICD-10-CM | POA: Insufficient documentation

## 2019-02-13 LAB — CBC WITH DIFFERENTIAL/PLATELET
Abs Immature Granulocytes: 0.02 10*3/uL (ref 0.00–0.07)
Basophils Absolute: 0 10*3/uL (ref 0.0–0.1)
Basophils Relative: 0 %
Eosinophils Absolute: 0.1 10*3/uL (ref 0.0–0.5)
Eosinophils Relative: 1 %
HCT: 27.1 % — ABNORMAL LOW (ref 36.0–46.0)
Hemoglobin: 8.3 g/dL — ABNORMAL LOW (ref 12.0–15.0)
Immature Granulocytes: 0 %
Lymphocytes Relative: 13 %
Lymphs Abs: 0.6 10*3/uL — ABNORMAL LOW (ref 0.7–4.0)
MCH: 35.2 pg — ABNORMAL HIGH (ref 26.0–34.0)
MCHC: 30.6 g/dL (ref 30.0–36.0)
MCV: 114.8 fL — ABNORMAL HIGH (ref 80.0–100.0)
Monocytes Absolute: 0.5 10*3/uL (ref 0.1–1.0)
Monocytes Relative: 11 %
Neutro Abs: 3.3 10*3/uL (ref 1.7–7.7)
Neutrophils Relative %: 75 %
Platelets: 406 10*3/uL — ABNORMAL HIGH (ref 150–400)
RBC: 2.36 MIL/uL — ABNORMAL LOW (ref 3.87–5.11)
RDW: 16.9 % — ABNORMAL HIGH (ref 11.5–15.5)
WBC: 4.5 10*3/uL (ref 4.0–10.5)
nRBC: 0 % (ref 0.0–0.2)

## 2019-02-13 LAB — SARS CORONAVIRUS 2 (TAT 6-24 HRS): SARS Coronavirus 2: NEGATIVE

## 2019-02-16 ENCOUNTER — Encounter (HOSPITAL_COMMUNITY)
Admission: RE | Disposition: A | Discharge status: Home / Self Care | Payer: Self-pay | Source: Home / Self Care | Attending: General Surgery

## 2019-02-16 ENCOUNTER — Encounter (HOSPITAL_COMMUNITY): Payer: Self-pay | Admitting: *Deleted

## 2019-02-16 ENCOUNTER — Ambulatory Visit (HOSPITAL_COMMUNITY): Payer: Medicare Other

## 2019-02-16 ENCOUNTER — Ambulatory Visit: Payer: Medicare Other

## 2019-02-16 ENCOUNTER — Ambulatory Visit (HOSPITAL_COMMUNITY): Payer: Medicare Other | Admitting: Anesthesiology

## 2019-02-16 ENCOUNTER — Ambulatory Visit (HOSPITAL_COMMUNITY)
Admission: RE | Admit: 2019-02-16 | Discharge: 2019-02-16 | Disposition: A | Discharge status: Home / Self Care | Payer: Medicare Other | Attending: General Surgery | Admitting: General Surgery

## 2019-02-16 DIAGNOSIS — I7300 Raynaud's syndrome without gangrene: Secondary | ICD-10-CM | POA: Insufficient documentation

## 2019-02-16 DIAGNOSIS — Z880 Allergy status to penicillin: Secondary | ICD-10-CM | POA: Insufficient documentation

## 2019-02-16 DIAGNOSIS — E785 Hyperlipidemia, unspecified: Secondary | ICD-10-CM | POA: Insufficient documentation

## 2019-02-16 DIAGNOSIS — C3491 Malignant neoplasm of unspecified part of right bronchus or lung: Secondary | ICD-10-CM

## 2019-02-16 DIAGNOSIS — E7800 Pure hypercholesterolemia, unspecified: Secondary | ICD-10-CM | POA: Insufficient documentation

## 2019-02-16 DIAGNOSIS — Z8249 Family history of ischemic heart disease and other diseases of the circulatory system: Secondary | ICD-10-CM | POA: Diagnosis not present

## 2019-02-16 DIAGNOSIS — I509 Heart failure, unspecified: Secondary | ICD-10-CM | POA: Insufficient documentation

## 2019-02-16 DIAGNOSIS — Z809 Family history of malignant neoplasm, unspecified: Secondary | ICD-10-CM | POA: Diagnosis not present

## 2019-02-16 DIAGNOSIS — Z7982 Long term (current) use of aspirin: Secondary | ICD-10-CM | POA: Insufficient documentation

## 2019-02-16 DIAGNOSIS — K219 Gastro-esophageal reflux disease without esophagitis: Secondary | ICD-10-CM | POA: Diagnosis not present

## 2019-02-16 DIAGNOSIS — Z79899 Other long term (current) drug therapy: Secondary | ICD-10-CM | POA: Insufficient documentation

## 2019-02-16 DIAGNOSIS — M8580 Other specified disorders of bone density and structure, unspecified site: Secondary | ICD-10-CM | POA: Insufficient documentation

## 2019-02-16 DIAGNOSIS — Z853 Personal history of malignant neoplasm of breast: Secondary | ICD-10-CM | POA: Diagnosis not present

## 2019-02-16 DIAGNOSIS — Z87891 Personal history of nicotine dependence: Secondary | ICD-10-CM | POA: Diagnosis not present

## 2019-02-16 DIAGNOSIS — J439 Emphysema, unspecified: Secondary | ICD-10-CM | POA: Diagnosis not present

## 2019-02-16 DIAGNOSIS — Z9012 Acquired absence of left breast and nipple: Secondary | ICD-10-CM | POA: Insufficient documentation

## 2019-02-16 DIAGNOSIS — I2510 Atherosclerotic heart disease of native coronary artery without angina pectoris: Secondary | ICD-10-CM | POA: Insufficient documentation

## 2019-02-16 DIAGNOSIS — M1990 Unspecified osteoarthritis, unspecified site: Secondary | ICD-10-CM | POA: Diagnosis not present

## 2019-02-16 DIAGNOSIS — I110 Hypertensive heart disease with heart failure: Secondary | ICD-10-CM | POA: Diagnosis not present

## 2019-02-16 DIAGNOSIS — Z801 Family history of malignant neoplasm of trachea, bronchus and lung: Secondary | ICD-10-CM | POA: Insufficient documentation

## 2019-02-16 DIAGNOSIS — R918 Other nonspecific abnormal finding of lung field: Secondary | ICD-10-CM | POA: Insufficient documentation

## 2019-02-16 DIAGNOSIS — Z95828 Presence of other vascular implants and grafts: Secondary | ICD-10-CM

## 2019-02-16 HISTORY — PX: PORTACATH PLACEMENT: SHX2246

## 2019-02-16 SURGERY — INSERTION, TUNNELED CENTRAL VENOUS DEVICE, WITH PORT
Anesthesia: Monitor Anesthesia Care | Site: Chest | Laterality: Left

## 2019-02-16 MED ORDER — HYDROCODONE-ACETAMINOPHEN 7.5-325 MG PO TABS: 1.0000 | ORAL_TABLET | Freq: Once | ORAL | Status: DC | PRN

## 2019-02-16 MED ORDER — CHLORHEXIDINE GLUCONATE CLOTH 2 % EX PADS: 6.0000 | MEDICATED_PAD | Freq: Once | CUTANEOUS | Status: DC

## 2019-02-16 MED ORDER — HEPARIN SOD (PORK) LOCK FLUSH 100 UNIT/ML IV SOLN
INTRAVENOUS | Status: DC | PRN
  Administered 2019-02-16: 500 [IU] via INTRAVENOUS

## 2019-02-16 MED ORDER — KETOROLAC TROMETHAMINE 30 MG/ML IJ SOLN: 15.0000 mg | Freq: Once | INTRAMUSCULAR | Status: DC

## 2019-02-16 MED ORDER — MIDAZOLAM HCL 2 MG/2ML IJ SOLN
INTRAMUSCULAR | Status: AC
  Filled 2019-02-16: qty 2

## 2019-02-16 MED ORDER — LIDOCAINE HCL (PF) 1 % IJ SOLN
INTRAMUSCULAR | Status: DC | PRN
  Administered 2019-02-16: 8 mL

## 2019-02-16 MED ORDER — FENTANYL CITRATE (PF) 100 MCG/2ML IJ SOLN
INTRAMUSCULAR | Status: DC | PRN
  Administered 2019-02-16 (×2): 50 ug via INTRAVENOUS

## 2019-02-16 MED ORDER — MIDAZOLAM HCL 5 MG/5ML IJ SOLN
INTRAMUSCULAR | Status: DC | PRN
  Administered 2019-02-16: 2 mg via INTRAVENOUS

## 2019-02-16 MED ORDER — LACTATED RINGERS IV SOLN
INTRAVENOUS | Status: DC
  Administered 2019-02-16: 07:00:00 1000 mL via INTRAVENOUS

## 2019-02-16 MED ORDER — VANCOMYCIN HCL IN DEXTROSE 1-5 GM/200ML-% IV SOLN
1000.0000 mg | INTRAVENOUS | Status: AC
  Administered 2019-02-16: 1000 mg via INTRAVENOUS
  Filled 2019-02-16: qty 200

## 2019-02-16 MED ORDER — PROPOFOL 500 MG/50ML IV EMUL
INTRAVENOUS | Status: DC | PRN
  Administered 2019-02-16: 75 ug/kg/min via INTRAVENOUS

## 2019-02-16 MED ORDER — LIDOCAINE HCL (CARDIAC) PF 100 MG/5ML IV SOSY
PREFILLED_SYRINGE | INTRAVENOUS | Status: DC | PRN
  Administered 2019-02-16: 20 mg via INTRAVENOUS

## 2019-02-16 MED ORDER — HEPARIN SOD (PORK) LOCK FLUSH 100 UNIT/ML IV SOLN
INTRAVENOUS | Status: AC
  Filled 2019-02-16: qty 5

## 2019-02-16 MED ORDER — MIDAZOLAM HCL 2 MG/2ML IJ SOLN: 0.5000 mg | Freq: Once | INTRAMUSCULAR | Status: DC | PRN

## 2019-02-16 MED ORDER — SODIUM CHLORIDE (PF) 0.9 % IJ SOLN
INTRAMUSCULAR | Status: DC | PRN
  Administered 2019-02-16: 50 mL via INTRAVENOUS

## 2019-02-16 MED ORDER — HYDROMORPHONE HCL 1 MG/ML IJ SOLN: 0.2500 mg | INTRAMUSCULAR | Status: DC | PRN

## 2019-02-16 MED ORDER — FENTANYL CITRATE (PF) 100 MCG/2ML IJ SOLN
INTRAMUSCULAR | Status: AC
  Filled 2019-02-16: qty 2

## 2019-02-16 MED ORDER — LIDOCAINE HCL (PF) 1 % IJ SOLN
INTRAMUSCULAR | Status: AC
  Filled 2019-02-16: qty 30

## 2019-02-16 MED ORDER — PROMETHAZINE HCL 25 MG/ML IJ SOLN: 6.2500 mg | INTRAMUSCULAR | Status: DC | PRN

## 2019-02-16 MED ORDER — PROPOFOL 10 MG/ML IV BOLUS
INTRAVENOUS | Status: AC
  Filled 2019-02-16: qty 40

## 2019-02-16 SURGICAL SUPPLY — 30 items
APPLICATOR CHLORAPREP 10.5 ORG (MISCELLANEOUS) ×3 IMPLANT
BAG DECANTER FOR FLEXI CONT (MISCELLANEOUS) ×3 IMPLANT
CLOTH BEACON ORANGE TIMEOUT ST (SAFETY) ×3 IMPLANT
COVER LIGHT HANDLE STERIS (MISCELLANEOUS) ×6 IMPLANT
COVER WAND RF STERILE (DRAPES) ×3 IMPLANT
DECANTER SPIKE VIAL GLASS SM (MISCELLANEOUS) ×3 IMPLANT
DERMABOND ADVANCED (GAUZE/BANDAGES/DRESSINGS) ×2
DERMABOND ADVANCED .7 DNX12 (GAUZE/BANDAGES/DRESSINGS) ×1 IMPLANT
DRAPE C-ARM FOLDED MOBILE STRL (DRAPES) ×3 IMPLANT
ELECT REM PT RETURN 9FT ADLT (ELECTROSURGICAL) ×3
ELECTRODE REM PT RTRN 9FT ADLT (ELECTROSURGICAL) ×1 IMPLANT
GLOVE BIOGEL PI IND STRL 7.0 (GLOVE) ×2 IMPLANT
GLOVE BIOGEL PI INDICATOR 7.0 (GLOVE) ×4
GLOVE ECLIPSE 6.0 STRL STRAW (GLOVE) ×3 IMPLANT
GLOVE ECLIPSE 6.5 STRL STRAW (GLOVE) ×3 IMPLANT
GLOVE SURG SS PI 7.5 STRL IVOR (GLOVE) ×3 IMPLANT
GOWN STRL REUS W/TWL LRG LVL3 (GOWN DISPOSABLE) ×6 IMPLANT
IV NS 500ML (IV SOLUTION) ×2
IV NS 500ML BAXH (IV SOLUTION) ×1 IMPLANT
KIT PORT POWER 8FR ISP MRI (Port) ×3 IMPLANT
KIT TURNOVER KIT A (KITS) ×3 IMPLANT
NEEDLE HYPO 25X1 1.5 SAFETY (NEEDLE) ×3 IMPLANT
PACK MINOR (CUSTOM PROCEDURE TRAY) ×3 IMPLANT
PAD ARMBOARD 7.5X6 YLW CONV (MISCELLANEOUS) ×3 IMPLANT
SET BASIN LINEN APH (SET/KITS/TRAYS/PACK) ×3 IMPLANT
SUT MNCRL AB 4-0 PS2 18 (SUTURE) ×3 IMPLANT
SUT VIC AB 3-0 SH 27 (SUTURE) ×2
SUT VIC AB 3-0 SH 27X BRD (SUTURE) ×1 IMPLANT
SYR 5ML LL (SYRINGE) ×3 IMPLANT
SYR CONTROL 10ML LL (SYRINGE) ×3 IMPLANT

## 2019-02-16 NOTE — Addendum Note (Signed)
Addendum  created 02/16/19 0835 by Mickel Baas, CRNA   Flowsheet accepted, Intraprocedure Flowsheets edited

## 2019-02-16 NOTE — Interval H&P Note (Signed)
History and Physical Interval Note:  02/16/2019 7:13 AM  Ashley [name]  has presented today for surgery, with the diagnosis of SMALL CELL CARCINOMA RIGHT LUNG.  The various methods of treatment have been discussed with the patient and family. After consideration of risks, benefits and other options for treatment, the patient has consented to  Procedure(s): INSERTION PORT-A-CATH (Left) as a surgical intervention.  The patient's history has been reviewed, patient examined, no change in status, stable for surgery.  I have reviewed the patient's chart and labs.  Questions were answered to the patient's satisfaction.     Aviva Signs

## 2019-02-16 NOTE — Anesthesia Preprocedure Evaluation (Signed)
Anesthesia Evaluation  Patient identified by MRN, date of birth, ID band Patient awake    Reviewed: Allergy & Precautions, NPO status , Patient's Chart, lab work & pertinent test results  Airway Mallampati: III  TM Distance: >3 FB Neck ROM: Full    Dental no notable dental hx. (+) Teeth Intact   Pulmonary shortness of breath, with exertion and Long-Term Oxygen Therapy, COPD,  oxygen dependent, Patient abstained from smoking., former smoker,    Pulmonary exam normal breath sounds clear to auscultation       Cardiovascular Exercise Tolerance: Poor hypertension, Pt. on medications + angina with exertion + CAD and +CHF  Normal cardiovascular examII Rhythm:Regular Rate:Normal  Limited ET due to COPD/Anemia  Denies CP- last EF ~50    Neuro/Psych  Neuromuscular disease negative psych ROS   GI/Hepatic Neg liver ROS, hiatal hernia, GERD  Medicated and Controlled,  Endo/Other  negative endocrine ROS  Renal/GU negative Renal ROS  negative genitourinary   Musculoskeletal  (+) Arthritis , Osteoarthritis,    Abdominal   Peds negative pediatric ROS (+)  Hematology negative hematology ROS (+) anemia , Severe Anemia -last hgb 8.3   Anesthesia Other Findings   Reproductive/Obstetrics negative OB ROS                             Anesthesia Physical Anesthesia Plan  ASA: IV  Anesthesia Plan: MAC   Post-op Pain Management:    Induction: Intravenous  PONV Risk Score and Plan: 2 and Propofol infusion, TIVA, Treatment may vary due to age or medical condition and Midazolam  Airway Management Planned: Nasal Cannula and Simple Face Mask  Additional Equipment:   Intra-op Plan:   Post-operative Plan:   Informed Consent: I have reviewed the patients History and Physical, chart, labs and discussed the procedure including the risks, benefits and alternatives for the proposed anesthesia with the patient or  authorized representative who has indicated his/her understanding and acceptance.     Dental advisory given  Plan Discussed with: CRNA  Anesthesia Plan Comments: (Plan Full PPE use  Plan GA with GETA as needed d/w pt -WTP with same after Q&A)        Anesthesia Quick Evaluation

## 2019-02-16 NOTE — Op Note (Signed)
Patient:  Ashley [name]  DOB:  1950/05/20  MRN:  [id number]   Preop Diagnosis: Small cell lung carcinoma, right  Postop Diagnosis: Same  Procedure: Port-A-Cath insertion  Surgeon: Aviva Signs, MD  Anes: MAC  Indications: Patient is a 68 year old white female who is about to undergo chemotherapy for right lung carcinoma.  The risks and benefits of the procedure including bleeding, infection, and pneumothorax were fully explained to the patient, who gave informed consent.  Procedure note: The patient was placed in Trendelenburg position after the left upper chest was prepped and draped using the usual sterile technique with ChloraPrep.  Surgical site confirmation was performed.  1% Xylocaine was used for local anesthesia.  Incision was made below the left clavicle.  A subcutaneous pocket was formed.  A needle was advanced into the left subclavian vein using the Seldinger technique without difficulty.  A guidewire was then advanced into the right atrium under fluoroscopic guidance.  An introducer and peel-away sheath were placed over the guidewire.  The catheter was inserted through the peel-away sheath and the peel-away sheath was removed.  The catheter was then attached to the port and the port placed in subcutaneous pocket.  Adequate positioning was confirmed by fluoroscopy.  Good backflow of venous blood was noted on aspiration of the port.  The port was flushed with heparin flush.  The subcutaneous layer was reapproximated using a 3-0 Vicryl interrupted suture.  The skin was closed using a 4-0 Monocryl subcuticular suture.  Dermabond was applied.  All tape and needle counts were correct at the end of the procedure.  The patient was awakened and transferred to PACU in stable condition.  A chest x-ray will be performed at that time.  Complications: None  EBL: Minimal  Specimen: None

## 2019-02-16 NOTE — Transfer of Care (Signed)
Immediate Anesthesia Transfer of Care Note  Patient: Ashley [name]  Procedure(s) Performed: INSERTION PORT-A-CATH (CATHETER  LEFT SUBCLAVIAN) (Left Chest)  Patient Location: PACU  Anesthesia Type:MAC  Level of Consciousness: awake, alert  and oriented  Airway & Oxygen Therapy: Patient Spontanous Breathing  Post-op Assessment: Report given to RN and Post -op Vital signs reviewed and stable  Post vital signs: Reviewed and stable  Last Vitals:  Vitals Value Taken Time  BP 121/70 02/16/19 0800  Temp    Pulse 64 02/16/19 0800  Resp 17 02/16/19 0800  SpO2 100 % 02/16/19 0800  Vitals shown include unvalidated device data.  Last Pain:  Vitals:   02/16/19 0651  TempSrc: Oral  PainSc: 0-No pain      Patients Stated Pain Goal: 8 (50/41/36 4383)  Complications: No apparent anesthesia complications

## 2019-02-16 NOTE — Anesthesia Procedure Notes (Signed)
Procedure Name: MAC Performed by: Kristjan Derner A, CRNA Pre-anesthesia Checklist: Patient identified, Emergency Drugs available, Suction available, Patient being monitored and Timeout performed Patient Re-evaluated:Patient Re-evaluated prior to induction Oxygen Delivery Method: Simple face mask       

## 2019-02-16 NOTE — Addendum Note (Signed)
Addendum  created 02/16/19 0932 by Mickel Baas, CRNA   Intraprocedure Staff edited

## 2019-02-16 NOTE — Discharge Instructions (Signed)

## 2019-02-16 NOTE — Anesthesia Postprocedure Evaluation (Signed)
Anesthesia Post Note  Patient: Ashley [name]  Procedure(s) Performed: INSERTION PORT-A-CATH (CATHETER  LEFT SUBCLAVIAN) (Left Chest)  Patient location during evaluation: PACU Anesthesia Type: MAC Level of consciousness: awake and alert, oriented and patient cooperative Pain management: satisfactory to patient Vital Signs Assessment: post-procedure vital signs reviewed and stable Respiratory status: spontaneous breathing Postop Assessment: no apparent nausea or vomiting Anesthetic complications: no     Last Vitals:  Vitals:   02/16/19 0651  BP: 126/75  Pulse: 80  Resp: (!) 22  Temp: 36.6 C  SpO2: 100%    Last Pain:  Vitals:   02/16/19 0651  TempSrc: Oral  PainSc: 0-No pain                 Lujean Ebright A

## 2019-02-17 ENCOUNTER — Ambulatory Visit: Payer: Medicare Other

## 2019-02-17 ENCOUNTER — Encounter (HOSPITAL_COMMUNITY): Payer: Self-pay | Admitting: General Surgery

## 2019-02-18 ENCOUNTER — Ambulatory Visit: Payer: Medicare Other

## 2019-02-19 ENCOUNTER — Ambulatory Visit: Payer: Medicare Other

## 2019-02-20 ENCOUNTER — Ambulatory Visit: Payer: Medicare Other

## 2019-02-20 ENCOUNTER — Other Ambulatory Visit: Payer: Self-pay

## 2019-02-20 ENCOUNTER — Ambulatory Visit (INDEPENDENT_AMBULATORY_CARE_PROVIDER_SITE_OTHER): Payer: Self-pay | Admitting: Thoracic Surgery (Cardiothoracic Vascular Surgery)

## 2019-02-20 ENCOUNTER — Encounter: Payer: Self-pay | Admitting: Thoracic Surgery (Cardiothoracic Vascular Surgery)

## 2019-02-20 VITALS — BP 99/64 | HR 96 | Temp 97.7°F | Resp 20 | Ht 62.0 in | Wt 106.0 lb

## 2019-02-20 DIAGNOSIS — R918 Other nonspecific abnormal finding of lung field: Secondary | ICD-10-CM

## 2019-02-20 DIAGNOSIS — C3491 Malignant neoplasm of unspecified part of right bronchus or lung: Secondary | ICD-10-CM

## 2019-02-20 DIAGNOSIS — Z902 Acquired absence of lung [part of]: Secondary | ICD-10-CM

## 2019-02-20 NOTE — Progress Notes (Signed)
      [address]       [place] [address]             [phone]        Juda W Trefz [place] Medical Record #[id number] Date of Birth: [birth year]  Referring: Derek Jack, MD Primary Care: [name], NP Primary Cardiologist:No primary care provider on file.  Reason for visit:   follow-up  History of Present Illness:     Ms. Sobh is doing much better.  She no longer requires oxygen, except for at night.  She underwent port placement and will be starting her adjuvant therapy soon  Physical Exam: BP 99/64   Pulse 96   Temp 97.7 F (36.5 C) (Skin)   Resp 20   Ht 5\' 2"  (1.575 m)   Wt 106 lb (48.1 kg)   SpO2 94% Comment: RA  BMI 19.39 kg/m   Alert NAD regular, no murmur.  Incision clean.  Abdomen soft, NT/ND no peripheral edema   Diagnostic Studies & Laboratory data:      Assessment / Plan:   68 yo female with stage IIb small cell lung cancer Will follow-up in 6 months   Lajuana Matte 02/20/2019 2:23 PM

## 2019-02-23 ENCOUNTER — Ambulatory Visit: Payer: Medicare Other

## 2019-02-24 ENCOUNTER — Ambulatory Visit: Payer: Medicare Other

## 2019-02-25 ENCOUNTER — Ambulatory Visit: Payer: Medicare Other

## 2019-02-26 ENCOUNTER — Ambulatory Visit: Payer: Medicare Other

## 2019-02-27 ENCOUNTER — Ambulatory Visit: Payer: Medicare Other

## 2019-03-02 ENCOUNTER — Inpatient Hospital Stay (HOSPITAL_BASED_OUTPATIENT_CLINIC_OR_DEPARTMENT_OTHER): Payer: Medicare Other | Admitting: Hematology

## 2019-03-02 ENCOUNTER — Ambulatory Visit: Payer: Medicare Other

## 2019-03-02 ENCOUNTER — Inpatient Hospital Stay (HOSPITAL_COMMUNITY): Payer: Medicare Other | Attending: Hematology

## 2019-03-02 ENCOUNTER — Other Ambulatory Visit: Payer: Self-pay

## 2019-03-02 ENCOUNTER — Encounter (HOSPITAL_COMMUNITY): Payer: Self-pay | Admitting: Hematology

## 2019-03-02 VITALS — BP 122/78 | HR 73 | Temp 97.5°F | Resp 18 | Wt 107.6 lb

## 2019-03-02 DIAGNOSIS — Z5111 Encounter for antineoplastic chemotherapy: Secondary | ICD-10-CM | POA: Insufficient documentation

## 2019-03-02 DIAGNOSIS — Z79899 Other long term (current) drug therapy: Secondary | ICD-10-CM | POA: Insufficient documentation

## 2019-03-02 DIAGNOSIS — Z853 Personal history of malignant neoplasm of breast: Secondary | ICD-10-CM | POA: Diagnosis not present

## 2019-03-02 DIAGNOSIS — D696 Thrombocytopenia, unspecified: Secondary | ICD-10-CM

## 2019-03-02 DIAGNOSIS — Z9221 Personal history of antineoplastic chemotherapy: Secondary | ICD-10-CM | POA: Diagnosis not present

## 2019-03-02 DIAGNOSIS — Z85820 Personal history of malignant melanoma of skin: Secondary | ICD-10-CM | POA: Diagnosis not present

## 2019-03-02 DIAGNOSIS — D473 Essential (hemorrhagic) thrombocythemia: Secondary | ICD-10-CM | POA: Diagnosis not present

## 2019-03-02 DIAGNOSIS — Z90721 Acquired absence of ovaries, unilateral: Secondary | ICD-10-CM | POA: Diagnosis not present

## 2019-03-02 DIAGNOSIS — C3491 Malignant neoplasm of unspecified part of right bronchus or lung: Secondary | ICD-10-CM | POA: Insufficient documentation

## 2019-03-02 LAB — CBC WITH DIFFERENTIAL/PLATELET
Abs Immature Granulocytes: 0.01 10*3/uL (ref 0.00–0.07)
Basophils Absolute: 0 10*3/uL (ref 0.0–0.1)
Basophils Relative: 1 %
Eosinophils Absolute: 0.1 10*3/uL (ref 0.0–0.5)
Eosinophils Relative: 3 %
HCT: 24.6 % — ABNORMAL LOW (ref 36.0–46.0)
Hemoglobin: 7.6 g/dL — ABNORMAL LOW (ref 12.0–15.0)
Immature Granulocytes: 0 %
Lymphocytes Relative: 17 %
Lymphs Abs: 0.7 10*3/uL (ref 0.7–4.0)
MCH: 36.2 pg — ABNORMAL HIGH (ref 26.0–34.0)
MCHC: 30.9 g/dL (ref 30.0–36.0)
MCV: 117.1 fL — ABNORMAL HIGH (ref 80.0–100.0)
Monocytes Absolute: 0.5 10*3/uL (ref 0.1–1.0)
Monocytes Relative: 10 %
Neutro Abs: 3.1 10*3/uL (ref 1.7–7.7)
Neutrophils Relative %: 69 %
Platelets: 309 10*3/uL (ref 150–400)
RBC: 2.1 MIL/uL — ABNORMAL LOW (ref 3.87–5.11)
RDW: 18.3 % — ABNORMAL HIGH (ref 11.5–15.5)
WBC: 4.5 10*3/uL (ref 4.0–10.5)
nRBC: 0 % (ref 0.0–0.2)

## 2019-03-02 LAB — IRON AND TIBC
Iron: 66 ug/dL (ref 28–170)
Saturation Ratios: 24 % (ref 10.4–31.8)
TIBC: 279 ug/dL (ref 250–450)
UIBC: 213 ug/dL

## 2019-03-02 LAB — COMPREHENSIVE METABOLIC PANEL
ALT: 9 U/L (ref 0–44)
AST: 18 U/L (ref 15–41)
Albumin: 3.1 g/dL — ABNORMAL LOW (ref 3.5–5.0)
Alkaline Phosphatase: 77 U/L (ref 38–126)
Anion gap: 8 (ref 5–15)
BUN: 16 mg/dL (ref 8–23)
CO2: 26 mmol/L (ref 22–32)
Calcium: 8.6 mg/dL — ABNORMAL LOW (ref 8.9–10.3)
Chloride: 103 mmol/L (ref 98–111)
Creatinine, Ser: 1.22 mg/dL — ABNORMAL HIGH (ref 0.44–1.00)
GFR calc Af Amer: 52 mL/min — ABNORMAL LOW (ref >60)
GFR calc non Af Amer: 45 mL/min — ABNORMAL LOW (ref >60)
Glucose, Bld: 117 mg/dL — ABNORMAL HIGH (ref 70–99)
Potassium: 3.4 mmol/L — ABNORMAL LOW (ref 3.5–5.1)
Sodium: 137 mmol/L (ref 135–145)
Total Bilirubin: 0.6 mg/dL (ref 0.3–1.2)
Total Protein: 7.1 g/dL (ref 6.5–8.1)

## 2019-03-02 LAB — FOLATE: Folate: 7 ng/mL (ref >5.9)

## 2019-03-02 LAB — VITAMIN B12: Vitamin B-12: 384 pg/mL (ref 180–914)

## 2019-03-02 LAB — FERRITIN: Ferritin: 107 ng/mL (ref 11–307)

## 2019-03-02 NOTE — Progress Notes (Signed)
Ashley [name], [place] [address]   CLINIC:  Medical Oncology/Hematology  PCP:  Ashley Rival, NP [address] [place] [address] [phone]   REASON FOR VISIT: Small cell lung cancer, essential thrombocytosis.  CURRENT THERAPY:Radiation followed by chemotherapy.    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 The 3 liver lesions are not hypermetabolic. The radiologist suspects they may be subtly present on prior CT chest from 10/2013, to further reassuring that these are likely benign lesions.   Melanoma ([place])  07/09/2013 Initial Biopsy   Initial biopsy L upper thigh/buttocks melanoma   07/20/2013 Surgery   Excision L lateral buttock/upper thigh melanoma, clear margins   07/20/2013 Pathology Results   Breslow depth 1.02 mm, pT2a       INTERVAL HISTORY:  Ashley [name] 68 y.o. female seen for follow-up of small cell lung cancer.  She had radiation therapy completed about 2 weeks ago.  She also had port placed.  Her weight has decreased to 107.6.  Appetite is 50%.  Energy levels are 50%.  Patient is reportedly eating 2 small meals per day.  Shortness of breath on exertion is stable.  She does report occasional episodes of nausea but denied any vomiting.  Numbness in the feet has been stable.  Reports some right-sided chest wall pain since surgery which is also stable.  Denies any fevers or chills.  No headaches or vision changes reported.  REVIEW OF SYSTEMS:   Review of Systems  Constitutional: Positive for fatigue.  Respiratory: Positive for shortness of breath.   Cardiovascular: Positive for chest pain.  Gastrointestinal: Positive for constipation and nausea.  Neurological: Positive for numbness.  All other systems reviewed and are negative.    PAST MEDICAL/SURGICAL HISTORY:  Past Medical History:  Diagnosis Date  . 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 Anginal pain ([place])   . Arthritis   . Ascending aortic aneurysm ([place])   . Cancer [facility]) 1995   breast, left, mastectomy/chemo  . Chest pain    Possibly cardiac. No evidence of ischemia/injury based upon normal troponin I. Chest discomfort could be tachycardia induced supply demand mismatch.   . CHF (congestive heart failure) ([place]) 11/17/2015   after surgery   . Colon adenomas   . Coronary artery disease   . DJD (degenerative joint disease)   . Dyspnea    with exertion  . Emphysema of lung ([place]) 11/17/2015  . Essential hypertension, benign   . GERD (gastroesophageal reflux disease)   . History of hiatal hernia   . Hyperlipidemia   . Hypertension   . Melanoma ([place]) 01/09/2016  . Osteopenia   . Palpitations   . Pernicious anemia 03/06/2016  . Pernicious anemia   . Pure hypercholesterolemia   . Raynaud's disease   . 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 Thrombocytosis (H[facility])    Idiopathic  . Vitamin D deficiency    Past Surgical History:  Procedure Laterality  Date  . ABDOMINAL HYSTERECTOMY    . ANTERIOR AND POSTERIOR REPAIR N/A 12/09/2014   Procedure: ANTERIOR (CYSTOCELE) AND POSTERIOR REPAIR (RECTOCELE);  Surgeon: Bjorn Loser, MD;  Location: [facility];  Service: Urology;  Laterality: N/A;  . ANTERIOR CERVICAL DECOMPRESSION/DISCECTOMY FUSION 4 LEVELS Right 10/03/2016   Procedure: ANTERIOR CERVICAL DECOMPRESSION FUSION, CERVICAL 4-5, CERVICAL 5-6, CERVICAL 6-7, CERVICAL 7 TO THORACIC 1 WITH INSTRUMENTATION AND ALLOGRAFT;  Surgeon: Phylliss Bob, MD;  Location:  [facility];  Service: Orthopedics;  Laterality: Right;  ANTERIOR CERVICAL DECOMPRESSION FUSION, CERVICAL 4-5, CERVICAL 5-6, CERVICAL 6-7, CERVICAL 7 TO THORACIC 1 WITH INSTRUMENTATION AND ALLOGRAFT; REQUEST 4 HO  . ANTERIOR LAT LUMBAR FUSION Left 11/16/2015   Procedure: LEFT SIDED LATERAL INTERBODY FUSION, LUMBAR 2-3, LUMBAR 3-4, LUMBAR 4-5 WITH INSTRUMENTATION;  Surgeon: Phylliss Bob, MD;  Location: [facility];  Service: Orthopedics;  Laterality: Left;  LEFT SIDED LATEARL INTERBODY FUSION, LUMBAR 2-3, LUMBAR 3-4, LUMBAR 4-5 WITH INSTRUMENTATION   . APPENDECTOMY    . BACK SURGERY    . BONE MARROW ASPIRATION  07/2012  . BONE MARROW BIOPSY  07/2012  . BREAST SURGERY    . CARDIAC CATHETERIZATION    . CARDIAC CATHETERIZATION N/A 01/20/2016   Procedure: Left Heart Cath and Coronary Angiography;  Surgeon: Burnell Blanks, MD;  Location: [facility];  Service: Cardiovascular;  Laterality: N/A;  . COLONOSCOPY  11/29/2010   Procedure: COLONOSCOPY;  Surgeon: Rogene Houston, MD;  [facility];  Service: Endoscopy;  Laterality: N/A;  . COLONOSCOPY N/A 02/18/2014   Procedure: COLONOSCOPY;  Surgeon: Rogene Houston, MD;  [facility];  Service: Endoscopy;  Laterality: N/A;  1030  . COLONOSCOPY N/A 02/25/2017   Procedure: COLONOSCOPY;  Surgeon: Rogene Houston, MD;  [facility];  Service: Endoscopy;  Laterality: N/A;  10:55  . CYSTOSCOPY N/A 12/09/2014   Procedure: CYSTOSCOPY;  Surgeon: Bjorn Loser, MD;  Location: [facility];  Service: Urology;  Laterality: N/A;  . ESOPHAGEAL DILATION N/A 02/25/2017   Procedure: ESOPHAGEAL DILATION;  Surgeon: Rogene Houston, MD;  [facility];  Service: Endoscopy;  Laterality: N/A;  . ESOPHAGOGASTRODUODENOSCOPY N/A 02/25/2017   Procedure: ESOPHAGOGASTRODUODENOSCOPY (EGD);  Surgeon: Rogene Houston, MD;  [facility];  Service: Endoscopy;  Laterality: N/A;  . IR GENERIC HISTORICAL  01/11/2016   IR RADIOLOGY PERIPHERAL  GUIDED IV START 01/11/2016 Saverio Danker, PA-C [facility]  . IR GENERIC HISTORICAL  01/11/2016   IR US GUIDE VASC ACCESS RIGHT 01/11/2016 Saverio Danker, PA-C [facility]  . LYMPH NODE DISSECTION Right 12/30/2018   Procedure: Lymph Node Dissection;  Surgeon: Lajuana Matte, MD;  Location: [place];  Service: Thoracic;  Laterality: Right;  . MASTECTOMY     left  . OVARIAN CYST SURGERY     x2  . POLYPECTOMY  02/25/2017   Procedure: POLYPECTOMY;  Surgeon: Rogene Houston, MD;  [facility];  Service: Endoscopy;;  . PORTACATH PLACEMENT Left 02/16/2019   Procedure: INSERTION PORT-A-CATH (CATHETER  LEFT SUBCLAVIAN);  Surgeon: Aviva Signs, MD;  Location: [facility];  Service: General;  Laterality: Left;  . SALPINGOOPHORECTOMY Bilateral 12/09/2014   Procedure: SALPINGO OOPHORECTOMY;  Surgeon: Servando Salina, MD;  Location: [facility];  Service: Gynecology;  Laterality: Bilateral;  . TUBAL LIGATION    . VAGINAL HYSTERECTOMY N/A 12/09/2014   Procedure: HYSTERECTOMY VAGINAL;  Surgeon: Servando Salina, MD;  Location: [facility];  Service: Gynecology;  Laterality: N/A;  . VIDEO ASSISTED THORACOSCOPY (  VATS)/ LOBECTOMY Right 12/30/2018   Procedure: VIDEO ASSISTED THORACOSCOPY (VATS)/RIGHT LOWER LOBE WEDGE RESECTION, RIGHT UPPER LOBECTOMY;  Surgeon: Lajuana Matte, MD;  Location: [facility];  Service: Thoracic;  Laterality: Right;  Marland Kitchen VIDEO BRONCHOSCOPY N/A 12/30/2018   Procedure: VIDEO BRONCHOSCOPY;  Surgeon: Lajuana Matte, MD;  [facility];  Service: Thoracic;  Laterality: N/A;     SOCIAL HISTORY:  Social History   Socioeconomic History  . Marital status: Divorced    Spouse name: Not on file  . Number of children: 2  . Years of education: Not on file  . Highest education level: Not on file  Occupational History  . Not on file  Social Needs  . Financial resource strain: Not on file  . Food insecurity    Worry: Not on file    Inability: Not on file  . Transportation needs     Medical: Not on file    Non-medical: Not on file  Tobacco Use  . Smoking status: Former Smoker    Packs/day: 0.50    Years: 50.00    Pack years: 25.00    Types: Cigarettes    Quit date: 11/25/2018    Years since quitting: 0.2  . Smokeless tobacco: Never Used  Substance and Sexual Activity  . Alcohol use: Yes    Comment: occasional  . Drug use: No  . Sexual activity: Yes    Birth control/protection: Post-menopausal  Lifestyle  . Physical activity    Days per week: Not on file    Minutes per session: Not on file  . Stress: Not on file  Relationships  . Social Herbalist on phone: Not on file    Gets together: Not on file    Attends religious service: Not on file    Active member of club or organization: Not on file    Attends meetings of clubs or organizations: Not on file    Relationship status: Not on file  . Intimate partner violence    Fear of current or ex partner: Not on file    Emotionally abused: Not on file    Physically abused: Not on file    Forced sexual activity: Not on file  Other Topics Concern  . Not on file  Social History Narrative  . Not on file    FAMILY HISTORY:  Family History  Problem Relation Age of Onset  . Hypertension Mother   . Heart failure Mother   . Congestive Heart Failure Mother   . COPD Mother   . Pernicious anemia Mother   . Cancer Mother        lung  . Hypertension Father   . CAD Father   . Heart attack Father   . Hypertension Sister   . Cancer Other   . Celiac disease Other     CURRENT MEDICATIONS:  Outpatient Encounter Medications as of 03/02/2019  Medication Sig  . aspirin EC 81 MG tablet Take 81 mg by mouth daily.  . Calcium Carb-Cholecalciferol (CALCIUM 600+D) 600-800 MG-UNIT TABS Take 1 tablet by mouth 2 (two) times daily.  . Cholecalciferol (VITAMIN D-3) 1000 units CAPS Take 1,000 Units daily by mouth.   . clonazePAM (KLONOPIN) 0.5 MG tablet Take 1 tablet (0.5 mg total) by mouth at bedtime. If take  this at bedtime, do NOT take an Oxycodone at night  . cyanocobalamin (,VITAMIN B-12,) 1000 MCG/ML injection INJECT 1 ML INTO THE MUSCLE ONCE MONTHLY AS DIRECTED. (Patient taking differently: Inject 1,000  mcg into the muscle every 30 (thirty) days. )  . gabapentin (NEURONTIN) 300 MG capsule Take 300 mg by mouth at bedtime.  . hydroxyurea (HYDREA) 500 MG capsule TAKE 1 CAPSULE DAILY. Please contact medical doctor who prescribed this as creatinine elevated at 1.52 on 01/07/2019. (Patient taking differently: Take 500 mg by mouth See admin instructions. Take 500 mg by mouth on Tuesday, Wednesday, Thursday and Friday, take 1000 mg Sunday, Monday and Saturday.)  . ibandronate (BONIVA) 150 MG tablet Take 150 mg by mouth every 30 (thirty) days.   . OXYGEN Inhale 3 L into the lungs daily.  . pantoprazole (PROTONIX) 40 MG tablet Take 40 mg daily by mouth.  . rosuvastatin (CRESTOR) 40 MG tablet Take 40 mg at bedtime by mouth.   . [DISCONTINUED] potassium chloride SA (KLOR-CON) 20 MEQ tablet Take 60 mEq by mouth at bedtime.   Marland Kitchen albuterol (PROVENTIL HFA;VENTOLIN HFA) 108 (90 Base) MCG/ACT inhaler Inhale 2 puffs into the lungs every 6 (six) hours as needed for wheezing or shortness of breath. (Patient not taking: Reported on 03/02/2019)  . guaiFENesin (MUCINEX) 600 MG 12 hr tablet Take 1 tablet (600 mg total) by mouth 2 (two) times daily as needed for cough or to loosen phlegm. (Patient not taking: Reported on 03/02/2019)  . ibuprofen (ADVIL,MOTRIN) 800 MG tablet Take 800 mg every 8 (eight) hours as needed by mouth for moderate pain.  Marland Kitchen ipratropium-albuterol (DUONEB) 0.5-2.5 (3) MG/3ML SOLN Take 3 mLs by nebulization every 6 (six) hours as needed (shortness of breath). (Patient not taking: Reported on 03/02/2019)  . methocarbamol (ROBAXIN) 500 MG tablet Take 500 mg by mouth every 6 (six) hours as needed for muscle spasms.   . ondansetron (ZOFRAN) 8 MG tablet Take 1 tablet (8 mg total) by mouth every 8 (eight) hours  as needed for nausea or vomiting. (Patient not taking: Reported on 03/02/2019)  . polyethylene glycol (MIRALAX / GLYCOLAX) 17 g packet Take 17 g by mouth daily as needed for mild constipation.   No facility-administered encounter medications on file as of 03/02/2019.     ALLERGIES:  Allergies  Allergen Reactions  . Penicillins Hives and Rash    Did it involve swelling of the face/tongue/throat, SOB, or low BP? No Did it involve sudden or severe rash/hives, skin peeling, or any reaction on the inside of your mouth or nose? No Did you need to seek medical attention at a hospital or doctor's office? No When did it last happen?5-10 year If all above answers are "NO", may proceed with cephalosporin use.    . Tape Rash    Medipore, Coban, and paper tape CAN be tolerated     PHYSICAL EXAM:  ECOG Performance status: 1  Vitals:   03/02/19 1531  BP: 122/78  Pulse: 73  Resp: 18  Temp: (!) 97.5 F (36.4 C)  SpO2: 100%   Filed Weights   03/02/19 1531  Weight: 107 lb 9.6 oz (48.8 kg)    Physical Exam Constitutional:      Appearance: Normal appearance. She is normal weight.  Cardiovascular:     Rate and Rhythm: Normal rate and regular rhythm.     Heart sounds: Normal heart sounds.  Pulmonary:     Effort: Pulmonary effort is normal.     Breath sounds: Normal breath sounds.  Abdominal:     General: Abdomen is flat. There is no distension.     Palpations: Abdomen is soft. There is no mass.  Musculoskeletal: Normal range of motion.  Skin:    General: Skin is warm and dry.  Neurological:     Mental Status: She is alert and oriented to person, place, and time. Mental status is at baseline.  Psychiatric:        Mood and Affect: Mood normal.        Behavior: Behavior normal.        Thought Content: Thought content normal.        Judgment: Judgment normal.      LABORATORY DATA:  I have reviewed the labs as listed.  CBC    Component Value Date/Time   WBC 4.5  03/02/2019 1444   RBC 2.10 (L) 03/02/2019 1444   HGB 7.6 (L) 03/02/2019 1444   HCT 24.6 (L) 03/02/2019 1444   PLT 309 03/02/2019 1444   MCV 117.1 (H) 03/02/2019 1444   MCH 36.2 (H) 03/02/2019 1444   MCHC 30.9 03/02/2019 1444   RDW 18.3 (H) 03/02/2019 1444   LYMPHSABS 0.7 03/02/2019 1444   MONOABS 0.5 03/02/2019 1444   EOSABS 0.1 03/02/2019 1444   BASOSABS 0.0 03/02/2019 1444   CMP Latest Ref Rng & Units 03/02/2019 01/20/2019 01/07/2019  Glucose 70 - 99 mg/dL 117(H) 122(H) 100(H)  BUN 8 - 23 mg/dL _0 Creatinine 0.44 - 1.00 mg/dL 1.22(H) 1.32(H) 1.52(H)  Sodium 135 - 145 mmol/L 137 134(L) 137  Potassium 3.5 - 5.1 mmol/L 3.4(L) 4.8 2.8(L)  Chloride 98 - 111 mmol/L 103 106 109  CO2 22 - 32 mmol/L 26 22 20(L)  Calcium 8.9 - 10.3 mg/dL 8.6(L) 9.3 8.1(L)  Total Protein 6.5 - 8.1 g/dL 7.1 6.8 -  Total Bilirubin 0.3 - 1.2 mg/dL 0.6 0.5 -  Alkaline Phos 38 - 126 U/L 77 89 -  AST 15 - 41 U/L 18 40 -  ALT 0 - 44 U/L 9 24 -       DIAGNOSTIC IMAGING:  I have independently reviewed the scans and discussed with the patient.     ASSESSMENT & PLAN:   Small cell lung carcinoma, right ([place]) 1.  Small cell lung cancer: -PET scan on 10/24/2018 shows hypermetabolic right upper lobe pulmonary nodules, suspicious for synchronous primary. -Core needle biopsy of the right upper lobe on 11/21/2018 consistent with small cell carcinoma. -Right upper lobectomy on 12/30/2018 shows invasive small cell carcinoma, 4.2 cm, involving visceral pleura, 1 out of 3 lymph nodes positive, lymphovascular invasion positive.  Metastatic carcinoma in 2, 11R lymph nodes present.  Right chest wall biopsy consistent with small cell carcinoma. -She received 30 Gray in 10 fractions from 01/28/2019 through 02/10/2019. -PET scan on 01/26/2019 showed dense consolidative airspace disease in the posterior right base, markedly hypermetabolic SUV 8.  Loculated anterior pleural fluid in the right hemithorax with adjacent  airspace consolidation is hypermetabolic with SUV 4.1.  Hypermetabolic uptake identified along the right lung staple lines.  2.5 cm focus of soft tissue in the right axilla with SUV 2.2.  9 mm short axis precarinal lymph node with SUV 2.5.  12 mm short axis subcarinal lymph node with SUV 3. -The PET scan findings were unclear if there are postsurgical.  Her clinical stage is between 2 and 3. -I have recommended a CT of the chest with contrast to confirm the findings on the PET scan. -We talked about chemotherapy with carboplatin and etoposide.  If she has findings of metastatic disease, will add immunotherapy.  If not we will plan for 4 cycles of carboplatin and etoposide.  We discussed about  the side effects in detail. -We will plan to start chemotherapy next week.  She already has a port placed.  2.  Essential thrombocytosis: -She was taking hydroxyurea 500 mg Tuesday to Friday and 1000 mg on Saturday, Sunday and Monday. -Her hemoglobin today is 7.6.  Platelet count is normal.  I have told her to hold off on hydroxyurea as she is starting chemotherapy.  3.  Weight loss: -Her weight today is 107.6.  Her baseline weight in the last 2 years is around 112. -She is eating 2 small meals per day.  She also eats oatmeal in the morning.  She was encouraged to drink nutritional supplements like boost or Ensure. -If her weight drops below 105, I will consider starting her on Megace.  3.  Macrocytic anemia: -Hemoglobin today is 7.6.  MCV is high.  This is likely from bone marrow suppression from [facility] and recent radiation therapy induced myelosuppression. -I will check her U86, folic acid, ferritin and iron panel today.  Total time spent is 40 minutes with more than 50% of the time spent face-to-face discussing new chemotherapy treatment plan, side effects, counseling and coordination of care.  Orders placed this encounter:  Orders Placed This Encounter  Procedures  . CT Chest W Contrast  . Iron and  TIBC  . Ferritin  . Vitamin B12  . Folate      Derek Jack, MD [place] [phone]

## 2019-03-02 NOTE — Progress Notes (Incomplete)
°  Patient Name: Ashley [name] MRN: [id number] DOB: 19-Nov-1950 Referring Physician: Melodie Bouillon Date of Service: 02/10/2019 [facility], [facility]                                                        End Of Treatment Note  Diagnoses: C34.91-Malignant neoplasm of unspecified part of right bronchus or lung  Cancer Staging: Stage IIB (at least pT2b, pN1) Small Cell Carcinoma of the RUL  Intent: Curative  Radiation Treatment Dates: 01/28/2019 through 02/10/2019 Site Technique Total Dose (Gy) Dose per Fx (Gy) Completed Fx Beam Energies  [place]ax: Lung_Rt 3D 30/30 3 10/10 6X, 10X   Narrative: The patient tolerated radiation therapy relatively well. She did report some moderate fatigue throughout. During final treatments, she reported intermittent sharp pain around sternum, shortness of breath with exertion, and occasional difficulty swallowing.   Plan: The patient will follow-up with radiation oncology in one month.  ________________________________________________  Blair Promise, PhD, MD  This document serves as a record of services personally performed by Gery Pray, MD. It was created on his behalf by Clerance Lav, a trained medical scribe. The creation of this record is based on the scribe's personal observations and the provider's statements to them. This document has been checked and approved by the attending provider.

## 2019-03-02 NOTE — Patient Instructions (Signed)
[place] at [facility] Discharge Instructions  You were seen today by Dr. Delton Coombes. He went over your recent lab results. He will schedule you for a CT chest. He will see you back in 1 week for labs, treatment and follow up.   Thank you for choosing [place] at [facility] to provide your oncology and hematology care.  To afford each patient quality time with our provider, please arrive at least 15 minutes before your scheduled appointment time.   If you have a lab appointment with the [facility] please come in thru the  Main Entrance and check in at the main information desk  You need to re-schedule your appointment should you arrive 10 or more minutes late.  We strive to give you quality time with our providers, and arriving late affects you and other patients whose appointments are after yours.  Also, if you no show three or more times for appointments you may be dismissed from the clinic at the providers discretion.     Again, thank you for choosing [facility].  Our hope is that these requests will decrease the amount of time that you wait before being seen by our physicians.       _____________________________________________________________  Should you have questions after your visit to [facility], please contact our office at [phone] between the hours of 8:00 a.m. and 4:30 p.m.  Voicemails left after 4:00 p.m. will not be returned until the following business day.  For prescription refill requests, have your pharmacy contact our office and allow 72 hours.    Cancer Center Support Programs:   > Cancer Support Group  2nd Tuesday of the month 1pm-2pm, Journey Room

## 2019-03-02 NOTE — Assessment & Plan Note (Signed)
1.  Small cell lung cancer: -PET scan on 10/24/2018 shows hypermetabolic right upper lobe pulmonary nodules, suspicious for synchronous primary. -Core needle biopsy of the right upper lobe on 11/21/2018 consistent with small cell carcinoma. -Right upper lobectomy on 12/30/2018 shows invasive small cell carcinoma, 4.2 cm, involving visceral pleura, 1 out of 3 lymph nodes positive, lymphovascular invasion positive.  Metastatic carcinoma in 2, 11R lymph nodes present.  Right chest wall biopsy consistent with small cell carcinoma. -She received 30 Gray in 10 fractions from 01/28/2019 through 02/10/2019. -PET scan on 01/26/2019 showed dense consolidative airspace disease in the posterior right base, markedly hypermetabolic SUV 8.  Loculated anterior pleural fluid in the right hemithorax with adjacent airspace consolidation is hypermetabolic with SUV 4.1.  Hypermetabolic uptake identified along the right lung staple lines.  2.5 cm focus of soft tissue in the right axilla with SUV 2.2.  9 mm short axis precarinal lymph node with SUV 2.5.  12 mm short axis subcarinal lymph node with SUV 3. -The PET scan findings were unclear if there are postsurgical.  Her clinical stage is between 2 and 3. -I have recommended a CT of the chest with contrast to confirm the findings on the PET scan. -We talked about chemotherapy with carboplatin and etoposide.  If she has findings of metastatic disease, will add immunotherapy.  If not we will plan for 4 cycles of carboplatin and etoposide.  We discussed about the side effects in detail. -We will plan to start chemotherapy next week.  She already has a port placed.  2.  Essential thrombocytosis: -She was taking hydroxyurea 500 mg Tuesday to Friday and 1000 mg on Saturday, Sunday and Monday. -Her hemoglobin today is 7.6.  Platelet count is normal.  I have told her to hold off on hydroxyurea as she is starting chemotherapy.  3.  Weight loss: -Her weight today is 107.6.  Her baseline  weight in the last 2 years is around 112. -She is eating 2 small meals per day.  She also eats oatmeal in the morning.  She was encouraged to drink nutritional supplements like boost or Ensure. -If her weight drops below 105, I will consider starting her on Megace.  3.  Macrocytic anemia: -Hemoglobin today is 7.6.  MCV is high.  This is likely from bone marrow suppression from [facility] and recent radiation therapy induced myelosuppression. -I will check her V78, folic acid, ferritin and iron panel today.

## 2019-03-02 NOTE — Progress Notes (Signed)
I met with patient and her daughter today following the visit with Dr. Delton Coombes.  I gave them written education material on the chemotherapy plan presented to them by Dr. Delton Coombes.  I gave them time to ask questions and all were answered to their satisfaction.

## 2019-03-03 ENCOUNTER — Ambulatory Visit: Payer: Medicare Other

## 2019-03-04 ENCOUNTER — Other Ambulatory Visit: Payer: Self-pay

## 2019-03-04 ENCOUNTER — Ambulatory Visit (HOSPITAL_COMMUNITY)
Admission: RE | Admit: 2019-03-04 | Discharge: 2019-03-04 | Disposition: A | Discharge status: Home / Self Care | Payer: Medicare Other | Source: Ambulatory Visit | Attending: Hematology | Admitting: Hematology

## 2019-03-04 ENCOUNTER — Ambulatory Visit: Payer: Medicare Other

## 2019-03-04 DIAGNOSIS — C3491 Malignant neoplasm of unspecified part of right bronchus or lung: Secondary | ICD-10-CM | POA: Diagnosis present

## 2019-03-04 MED ORDER — IOHEXOL 300 MG/ML  SOLN
75.0000 mL | Freq: Once | INTRAMUSCULAR | Status: AC | PRN
  Administered 2019-03-04: 60 mL via INTRAVENOUS

## 2019-03-04 NOTE — Patient Instructions (Addendum)
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  You will be treated every 3 weeks with a combination of chemotherapy drugs, carboplatin and etoposide.  The intent of treatment is to control your disease and keep it from spreading further.  You will see the doctor regularly throughout treatment.  We will obtain blood work from you prior to every treatment and monitor your results to make sure it is safe to give your treatment. The doctor monitors your response to treatment by the way you are feeling, your blood work, and by obtaining scans periodically. There will be wait times while you are here for treatment.  It will take about 30 minutes to 1 hour for your lab work to result.  Then there will be wait times while pharmacy mixes your medications.     Carboplatin (Paraplatin, CBDCA)  About This Drug  Carboplatin is used to treat cancer. It is given in the vein (IV). It will take 30 minutes to infuse.  Possible Side Effects  . Bone marrow suppression. This is a decrease in the number of white blood cells, red blood cells, and platelets. This may raise your risk of infection, make you tired and weak (fatigue), and raise your risk of bleeding.  . Nausea and vomiting (throwing up)  . Weakness  . Changes in your liver function  . Changes in your kidney function  . Electrolyte changes  . Pain  Note: Each of the side effects above was reported in 20% or greater of patients treated with carboplatin. Not all possible side effects are included above.  Warnings and Precautions  . Severe bone marrow suppression  . Allergic reactions, including anaphylaxis are rare but may happen in some patients. Signs of allergic reaction to this drug may be swelling of the face, feeling like your tongue or throat are swelling, trouble breathing, rash, itching, fever, chills, feeling dizzy, and/or feeling that your heart is beating in a  fast or not normal way. If this happens, do not take another dose of this drug. You should get urgent medical treatment.  . Severe nausea and vomiting  . Effects on the nerves are called peripheral neuropathy. This risk is increased if you are over the age of 19 or if you have received other medicine with risk of peripheral neuropathy. You may feel numbness, tingling, or pain in your hands and feet. It may be hard for you to button your clothes, open jars, or walk as usual. The effect on the nerves may get worse with more doses of the drug. These effects get better in some people after the drug is stopped but it does not get better in all people.  Marland Kitchen Blurred vision, loss of vision or other changes in eyesight  . Decreased hearing   - Skin and tissue irritation including redness, pain, warmth, or swelling at the IV site if the drug leaks out of the vein and into nearby tissue.  . Severe changes in your kidney function, which can cause kidney failure  . Severe changes in your liver function, which can cause liver failure  Note: Some of the side effects above are very rare. If you have concerns and/or questions, please discuss them with your medical team.  Important Information  . This drug may be present in the saliva, tears, sweat, urine, stool, vomit, semen, and vaginal secretions. Talk to your doctor and/or your nurse about the necessary precautions to take during this  time.  Treating Side Effects  . Manage tiredness by pacing your activities for the day.  . Be sure to include periods of rest between energy-draining activities.  . To decrease the risk of infection, wash your hands regularly.  . Avoid close contact with people who have a cold, the flu, or other infections.  . Take your temperature as your doctor or nurse tells you, and whenever you feel like you may have a fever.  . To help decrease the risk of bleeding, use a soft toothbrush. Check with your nurse before  using dental floss.  . Be very careful when using knives or tools.  . Use an electric shaver instead of a razor.  . Drink plenty of fluids (a minimum of eight glasses per day is recommended).  . If you throw up or have loose bowel movements, you should drink more fluids so that you do not become dehydrated (lack of water in the body from losing too much fluid).  . To help with nausea and vomiting, eat small, frequent meals instead of three large meals a day. Choose foods and drinks that are at room temperature. Ask your nurse or doctor about other helpful tips and medicine that is available to help stop or lessen these symptoms.  . If you have numbness and tingling in your hands and feet, be careful when cooking, walking, and handling sharp objects and hot liquids.  Marland Kitchen Keeping your pain under control is important to your well-being. Please tell your doctor or nurse if you are experiencing pain.  Food and Drug Interactions . There are no known interactions of carboplatin with food.  . This drug may interact with other medicines. Tell your doctor and pharmacist about all the prescription and over-the-counter medicines and dietary supplements (vitamins, minerals, herbs and others) that you are taking at this time. Also, check with your doctor or pharmacist before starting any new prescription or over-the-counter medicines, or dietary supplements to make sure that there are no interactions.  When to Call the Doctor  Call your doctor or nurse if you have any of these symptoms and/or any new or unusual symptoms:  . Fever of 100.4 F (38 C) or higher  . Chills  . Tiredness that interferes with your daily activities  . Feeling dizzy or lightheaded  . Easy bleeding or bruising  . Nausea that stops you from eating or drinking and/or is not relieved by prescribed medicines  . Throwing up more than 3 times a day  . Blurred vision or other changes in eyesight  . Decrease in  hearing or ringing in the ear  . Signs of allergic reaction: swelling of the face, feeling like your tongue or throat are swelling, trouble breathing, rash, itching, fever, chills, feeling dizzy, and/or feeling that your heart is beating in a fast or not normal way. If this happens, call 911 for emergency care.  . Signs of possible liver problems: dark urine, pale bowel movements, bad stomach pain, feeling very tired and weak, unusual itching, or yellowing of the eyes or skin  . Decreased urine, or very dark urine  . Numbness, tingling, or pain in your hands and feet  . Pain that does not go away or is not relieved by prescribed medicine  . While you are getting this drug, please tell your nurse right away if you have any pain, redness, or swelling at the site of the IV infusion, or if you have any new onset of symptoms, or if  you just feel "different" from before when the infusion was started.   Reproduction Warnings  . Pregnancy warning: This drug may have harmful effects on the unborn baby. Women of child bearing potential should use effective methods of birth control during your cancer treatment. Let your doctor know right away if you think you may be pregnant.  . Breastfeeding warning: It is not known if this drug passes into breast milk. For this reason, women should not breastfeed during treatment because this drug could enter the breast milk and cause harm to a breastfeeding baby.  . Fertility warning: Human fertility studies have not been done with this drug. Talk with your doctor or nurse if you plan to have children. Ask for information on sperm or egg banking.  Etoposide  About This Drug  Etoposide is used to treat cancer. It is given in the vein (IV).  It will take one hour to infuse.   Possible Side Effects  . Bone marrow suppression. This is a decrease in the number of white blood cells, red blood cells, and platelets. This may raise your risk of infection,  make you tired and weak (fatigue), and raise your risk of bleeding.  . Nausea and vomiting (throwing up)  . Hair loss. Hair loss is often temporary, although with certain medicine, hair loss can sometimes be permanent. Hair loss may happen suddenly or gradually. If you lose hair, you may lose it from your head, face, armpits, pubic area, chest, and/or legs. You may also notice your hair getting thin.  Note: Each of the side effects above was reported in 20% or greater of patients treated with etoposide. Not all possible side effects are included above.  Warnings and Precautions  . Severe bone marrow suppression, which may be life-threatening  . This drug may raise your risk of getting a second cancer, such as leukemia  . Low blood pressure with rapid infusion of the medication  . Allergic reactions, including anaphylaxis are rare but may happen in some patients. Signs of allergic reaction to this drug may be swelling of the face, feeling like your tongue or throat are swelling, trouble breathing, rash, itching, fever, chills, feeling dizzy, and/or feeling that your heart is beating in a fast or not normal way. If this happens, do not take another dose of this drug. You should get urgent medical treatment.  . These side effects may be more severe if you are receiving high doses of this medication included in pre-transplant chemotherapy.  Treating Side Effects  . Manage tiredness by pacing your activities for the day.  . Be sure to include periods of rest between energy-draining activities.  . To decrease the risk of infection, wash your hands regularly.  . Avoid close contact with people who have a cold, the flu, or other infections.  . Take your temperature as your doctor or nurse tells you, and whenever you feel like you may have a fever.  . To help decrease bleeding, use a soft toothbrush. Check with your nurse before using dental floss.  . Be very careful when using  knives or tools.  . Use an electric shaver instead of a razor.  . Drink plenty of fluids (a minimum of eight glasses per day is recommended).  . If you throw up or have loose bowel movements, you should drink more fluids so that you do not become dehydrated (lack of water in the body from losing too much fluid).  . To help with nausea and  vomiting, eat small, frequent meals instead of three large meals a day. Choose foods and drinks that are at room temperature. Ask your nurse or doctor about other helpful tips and medicine that is available to help or stop lessen these symptoms.  . To help with hair loss, wash with a mild shampoo and avoid washing your hair every day.  . Avoid rubbing your scalp, pat your hair or scalp dry.  . Avoid coloring your hair.  . Limit your use of hair spray, electric curlers, blow dryers, and curling irons.  . If you are interested in getting a wig, talk to your nurse. You can also call the [employer] at [phone]-[phone] to find out information about the "Look Good, Feel Better" program close to where you live. It is a free program where women getting chemotherapy can learn about wigs, turbans and scarves as well as makeup techniques and skin and nail care.  Food and Drug Interactions  . There are no known interactions of etoposide with food.  . This drug may interact with other medicines. Tell your doctor and pharmacist about all the medicines and dietary supplements (vitamins, minerals, herbs and others) that you are taking at this time. The safety and use of dietary supplements and alternative diets are often not known. Using these might affect your cancer or interfere with your treatment. Until more is known, you should not use dietary supplements or alternative diets without your cancer doctor's help.  . There are known interactions of etoposide with blood thinning medicine such as warfarin. Ask your doctor what precautions you should  take.  When to Call the Doctor  Call your doctor or nurse if you have any of these symptoms and/or any new or unusual symptoms:  . Fever of 100.4 F (38 C) or higher  . Chills  . Tiredness that interferes with your daily activities  . Feeling dizzy or lightheaded  . Feeling that your heart is beating in a fast or not normal way (palpitations)  . Easy bleeding or bruising  . Nausea that stops you from eating or drinking and/or is not relieved by prescribed medicines  . Throwing up more than 3 times a day  . Signs of allergic reaction: swelling of the face, feeling like your tongue or throat are swelling, trouble breathing, rash, itching, fever, chills, feeling dizzy, and/or feeling that your heart is beating in a fast or not normal way  . If you think you may be pregnant or may have impregnated your partner  Reproduction Warnings  . Pregnancy warning: This drug can have harmful effects on the unborn baby. Women of childbearing potential should use effective methods of birth control during your cancer treatment and for at least 6 months after treatment. Men with female partners of childbearing potential should use effective methods of birth control during your cancer treatment and for at least 4 months after your cancer treatment. Let your doctor know right away if you think you may be pregnant or may have impregnated your partner.  . Breastfeeding warning: It is not known if this drug passes into breast milk. For this reason, women should talk to their doctor about the risks and benefits of breastfeeding during treatment with this drug because this drug may enter the breast milk and cause harm to a breastfeeding baby.  . Fertility warning: In men and women both, this drug may affect your ability to have children in the future. Talk with your doctor or nurse if you  plan to have children. Ask for information on sperm or egg banking.   SELF CARE ACTIVITIES WHILE RECEIVING  CHEMOTHERAPY:  Hydration Increase your fluid intake 48 hours prior to treatment and drink at least 8 to 12 cups (64 ounces) of water/decaffeinated beverages per day after treatment. You can still have your cup of coffee or soda but these beverages do not count as part of your 8 to 12 cups that you need to drink daily. No alcohol intake.  Medications Continue taking your normal prescription medication as prescribed.  If you start any new herbal or new supplements please let us know first to make sure it is safe.  Mouth Care Have teeth cleaned professionally before starting treatment. Keep dentures and partial plates clean. Use soft toothbrush and do not use mouthwashes that contain alcohol. Biotene is a good mouthwash that is available at most pharmacies or may be ordered by calling [phone]. Use warm salt water gargles (1 teaspoon salt per 1 quart warm water) before and after meals and at bedtime. If you need dental work, please let the doctor know before you go for your appointment so that we can coordinate the best possible time for you in regards to your chemo regimen. You need to also let your dentist know that you are actively taking chemo. We may need to do labs prior to your dental appointment.  Skin Care Always use sunscreen that has not expired and with SPF (Sun Protection Factor) of 50 or higher. Wear hats to protect your head from the sun. Remember to use sunscreen on your hands, ears, face, & feet.  Use good moisturizing lotions such as udder cream, eucerin, or even Vaseline. Some chemotherapies can cause dry skin, color changes in your skin and nails.    . Avoid long, hot showers or baths. . Use gentle, fragrance-free soaps and laundry detergent. . Use moisturizers, preferably creams or ointments rather than lotions because the thicker consistency is better at preventing skin dehydration. Apply the cream or ointment within 15 minutes of showering. Reapply moisturizer at night, and  moisturize your hands every time after you wash them.  Hair Loss (if your doctor says your hair will fall out)  . If your doctor says that your hair is likely to fall out, decide before you begin chemo whether you want to wear a wig. You may want to shop before treatment to match your hair color. . Hats, turbans, and scarves can also camouflage hair loss, although some people prefer to leave their heads uncovered. If you go bare-headed outdoors, be sure to use sunscreen on your scalp. . Cut your hair short. It eases the inconvenience of shedding lots of hair, but it also can reduce the emotional impact of watching your hair fall out. . Don't perm or color your hair during chemotherapy. Those chemical treatments are already damaging to hair and can enhance hair loss. Once your chemo treatments are done and your hair has grown back, it's OK to resume dyeing or perming hair.  With chemotherapy, hair loss is almost always temporary. But when it grows back, it may be a different color or texture. In older adults who still had hair color before chemotherapy, the new growth may be completely gray.  Often, new hair is very fine and soft.  Infection Prevention Please wash your hands for at least 30 seconds using warm soapy water. Handwashing is the #1 way to prevent the spread of germs. Stay away from sick people or people  who are getting over a cold. If you develop respiratory systems such as green/yellow mucus production or productive cough or persistent cough let us know and we will see if you need an antibiotic. It is a good idea to keep a pair of gloves on when going into grocery stores/[employer] to decrease your risk of coming into contact with germs on the carts, etc. Carry alcohol hand gel with you at all times and use it frequently if out in public. If your temperature reaches 100.5 or higher please call the clinic and let us know.  If it is after hours or on the weekend please go to the ER if your  temperature is over 100.5.  Please have your own personal thermometer at home to use.    Sex and bodily fluids If you are going to have sex, a condom must be used to protect the person that isn't taking chemotherapy. Chemo can decrease your libido (sex drive). For a few days after chemotherapy, chemotherapy can be excreted through your bodily fluids.  When using the toilet please close the lid and flush the toilet twice.  Do this for a few day after you have had chemotherapy.   Effects of chemotherapy on your sex life Some changes are simple and won't last long. They won't affect your sex life permanently.  Sometimes you may feel: . too tired . not strong enough to be very active . sick or sore  . not in the mood . anxious or low Your anxiety might not seem related to sex. For example, you may be worried about the cancer and how your treatment is going. Or you may be worried about money, or about how you family are coping with your illness. These things can cause stress, which can affect your interest in sex. It's important to talk to your partner about how you feel. Remember - the changes to your sex life don't usually last long. There's usually no medical reason to stop having sex during chemo. The drugs won't have any long term physical effects on your performance or enjoyment of sex. Cancer can't be passed on to your partner during sex  Contraception It's important to use reliable contraception during treatment. Avoid getting pregnant while you or your partner are having chemotherapy. This is because the drugs may harm the baby. Sometimes chemotherapy drugs can leave a man or woman infertile.  This means you would not be able to have children in the future. You might want to talk to someone about permanent infertility. It can be very difficult to learn that you may no longer be able to have children. Some people find counselling helpful. There might be ways to preserve your fertility,  although this is easier for men than for women. You may want to speak to a fertility expert. You can talk about sperm banking or harvesting your eggs. You can also ask about other fertility options, such as donor eggs. If you have or have had breast cancer, your doctor might advise you not to take the contraceptive pill. This is because the hormones in it might affect the cancer.  It is not known for sure whether or not chemotherapy drugs can be passed on through semen or secretions from the vagina. Because of this some doctors advise people to use a barrier method if you have sex during treatment. This applies to vaginal, anal or oral sex. Generally, doctors advise a barrier method only for the time you are actually having the treatment  and for about a week after your treatment. Advice like this can be worrying, but this does not mean that you have to avoid being intimate with your partner. You can still have close contact with your partner and continue to enjoy sex.  Animals If you have cats or birds we just ask that you not change the litter or change the cage.  Please have someone else do this for you while you are on chemotherapy.   Food Safety During and After Cancer Treatment Food safety is important for people both during and after cancer treatment. Cancer and cancer treatments, such as chemotherapy, radiation therapy, and stem cell/bone marrow transplantation, often weaken the immune system. This makes it harder for your body to protect itself from foodborne illness, also called food poisoning. Foodborne illness is caused by eating food that contains harmful bacteria, parasites, or viruses.  Foods to avoid Some foods have a higher risk of becoming tainted with bacteria. These include: Marland Kitchen Unwashed fresh fruit and vegetables, especially leafy vegetables that can hide dirt and other contaminants . Raw sprouts, such as alfalfa sprouts . Raw or undercooked beef, especially ground beef, or other  raw or undercooked meat and poultry . Fatty, fried, or spicy foods immediately before or after treatment.  These can sit heavy on your stomach and make you feel nauseous. . Raw or undercooked shellfish, such as oysters. . Sushi and sashimi, which often contain raw fish.  . Unpasteurized beverages, such as unpasteurized fruit juices, raw milk, raw yogurt, or cider . Undercooked eggs, such as soft boiled, over easy, and poached; raw, unpasteurized eggs; or foods made with raw egg, such as homemade raw cookie dough and homemade mayonnaise  Simple steps for food safety  Shop smart. . Do not buy food stored or displayed in an unclean area. . Do not buy bruised or damaged fruits or vegetables. . Do not buy cans that have cracks, dents, or bulges. . Pick up foods that can spoil at the end of your shopping trip and store them in a cooler on the way home.  Prepare and clean up foods carefully. . Rinse all fresh fruits and vegetables under running water, and dry them with a clean towel or paper towel. . Clean the top of cans before opening them. . After preparing food, wash your hands for 20 seconds with hot water and soap. Pay special attention to areas between fingers and under nails. . Clean your utensils and dishes with hot water and soap. Marland Kitchen Disinfect your kitchen and cutting boards using 1 teaspoon of liquid, unscented bleach mixed into 1 quart of water.    Dispose of old food. . Eat canned and packaged food before its expiration date (the "use by" or "best before" date). . Consume refrigerated leftovers within 3 to 4 days. After that time, throw out the food. Even if the food does not smell or look spoiled, it still may be unsafe. Some bacteria, such as Listeria, can grow even on foods stored in the refrigerator if they are kept for too long.  Take precautions when eating out. . At restaurants, avoid buffets and salad bars where food sits out for a long time and comes in contact with many  people. Food can become contaminated when someone with a virus, often a norovirus, or another "bug" handles it. . Put any leftover food in a "to-go" container yourself, rather than having the server do it. And, refrigerate leftovers as soon as you get home. . Choose  restaurants that are clean and that are willing to prepare your food as you order it cooked.   AT HOME MEDICATIONS:                                                                                                                                                                Compazine/Prochlorperazine 10mg  tablet. Take 1 tablet every 6 hours as needed for nausea/vomiting. (This can make you sleepy)   EMLA cream. Apply a quarter size amount to port site 1 hour prior to chemo. Do not rub in. Cover with plastic wrap.   Diarrhea Sheet   If you are having loose stools/diarrhea, please purchase Imodium and begin taking as outlined:  At the first sign of poorly formed or loose stools you should begin taking Imodium (loperamide) 2 mg capsules. Take two tablets (4mg ) followed by one tablet (2mg ) every 2 hours - DO NOT EXCEED 8 tablets in 24 hours.  If it is bedtime and you are having loose stools, take 2 tablets at bedtime, then 2 tablets every 4 hours until morning.   Always call the [facility] if you are having loose stools/diarrhea that you can't get under control.  Loose stools/diarrhea leads to dehydration (loss of water) in your body.  We have other options of trying to get the loose stools/diarrhea to stop but you must let us know!   Constipation Sheet  Colace - 100 mg capsules - take 2 capsules daily.  If this doesn't help then you can increase to 2 capsules twice daily.  Please call if the above does not work for you. Do not go more than 2 days without a bowel movement.  It is very important that you do not become constipated.  It will make you feel sick to your stomach (nausea) and can cause abdominal pain and  vomiting.   Nausea Sheet   Compazine/Prochlorperazine 10mg  tablet. Take 1 tablet every 6 hours as needed for nausea/vomiting (This can make you drowsy).  If you are having persistent nausea (nausea that does not stop) please call the [facility] and let us know the amount of nausea that you are experiencing.  If you begin to vomit, you need to call the [facility] and if it is the weekend and you have vomited more than one time and can't get it to stop-go to the Emergency Room.  Persistent nausea/vomiting can lead to dehydration (loss of fluid in your body) and will make you feel very weak and unwell. Ice chips, sips of clear liquids, foods that are at room temperature, crackers, and toast tend to be better tolerated.   SYMPTOMS TO REPORT AS SOON AS POSSIBLE AFTER TREATMENT:  FEVER GREATER THAN 100.5 F  CHILLS WITH OR WITHOUT FEVER  NAUSEA AND VOMITING  THAT IS NOT CONTROLLED WITH YOUR NAUSEA MEDICATION  UNUSUAL SHORTNESS OF BREATH  UNUSUAL BRUISING OR BLEEDING  TENDERNESS IN MOUTH AND THROAT WITH OR WITHOUT   PRESENCE OF ULCERS  URINARY PROBLEMS  BOWEL PROBLEMS  UNUSUAL RASH   Wear comfortable clothing and clothing appropriate for easy access to any Portacath or PICC line. Let us know if there is anything that we can do to make your therapy better!    What to do if you need assistance after hours or on the weekends: CALL [phone].  HOLD on the line, do not hang up.  You will hear multiple messages but at the end you will be connected with a nurse triage line.  They will contact the doctor if necessary.  Most of the time they will be able to assist you.  Do not call the hospital operator.      I have been informed and understand all of the instructions given to me and have received a copy. I have been instructed to call the clinic [phone] or my family physician as soon as possible for continued medical care, if indicated. I do not have any more questions at  this time but understand that I may call the [facility] or the Patient Navigator at [phone] during office hours should I have questions or need assistance in obtaining follow-up care.

## 2019-03-05 ENCOUNTER — Inpatient Hospital Stay (HOSPITAL_COMMUNITY): Payer: Medicare Other

## 2019-03-05 ENCOUNTER — Other Ambulatory Visit (HOSPITAL_COMMUNITY): Payer: Self-pay

## 2019-03-05 ENCOUNTER — Ambulatory Visit: Payer: Medicare Other

## 2019-03-05 DIAGNOSIS — C3491 Malignant neoplasm of unspecified part of right bronchus or lung: Secondary | ICD-10-CM

## 2019-03-05 MED ORDER — PROCHLORPERAZINE MALEATE 10 MG PO TABS: 10.0000 mg | ORAL_TABLET | Freq: Four times a day (QID) | ORAL | 1 refills | Status: DC | PRN

## 2019-03-05 MED ORDER — LIDOCAINE-PRILOCAINE 2.5-2.5 % EX CREA: TOPICAL_CREAM | CUTANEOUS | 3 refills | Status: DC

## 2019-03-05 NOTE — Progress Notes (Signed)

## 2019-03-06 ENCOUNTER — Telehealth: Payer: Self-pay | Admitting: *Deleted

## 2019-03-06 ENCOUNTER — Ambulatory Visit: Payer: Medicare Other

## 2019-03-06 NOTE — Telephone Encounter (Signed)
CALLED PATIENT TO INFORM OF FU APPT. BEING MOVED TO 3:15 PM ON 03-16-19, SPOKE WITH PATIENT AND SHE VERIFIED UNDERSTANDING THIS

## 2019-03-09 ENCOUNTER — Inpatient Hospital Stay (HOSPITAL_BASED_OUTPATIENT_CLINIC_OR_DEPARTMENT_OTHER): Payer: Medicare Other | Admitting: Hematology

## 2019-03-09 ENCOUNTER — Inpatient Hospital Stay (HOSPITAL_COMMUNITY): Payer: Medicare Other

## 2019-03-09 ENCOUNTER — Ambulatory Visit: Payer: Medicare Other

## 2019-03-09 ENCOUNTER — Encounter (HOSPITAL_COMMUNITY): Payer: Self-pay | Admitting: Hematology

## 2019-03-09 ENCOUNTER — Other Ambulatory Visit: Payer: Self-pay

## 2019-03-09 VITALS — BP 138/84 | HR 69 | Temp 96.9°F | Resp 16 | Wt 106.9 lb

## 2019-03-09 VITALS — BP 125/80 | HR 65 | Temp 96.9°F | Resp 17

## 2019-03-09 DIAGNOSIS — N1831 Chronic kidney disease, stage 3a: Secondary | ICD-10-CM

## 2019-03-09 DIAGNOSIS — Z5111 Encounter for antineoplastic chemotherapy: Secondary | ICD-10-CM | POA: Diagnosis not present

## 2019-03-09 DIAGNOSIS — C3491 Malignant neoplasm of unspecified part of right bronchus or lung: Secondary | ICD-10-CM

## 2019-03-09 DIAGNOSIS — E876 Hypokalemia: Secondary | ICD-10-CM

## 2019-03-09 DIAGNOSIS — D510 Vitamin B12 deficiency anemia due to intrinsic factor deficiency: Secondary | ICD-10-CM

## 2019-03-09 DIAGNOSIS — N189 Chronic kidney disease, unspecified: Secondary | ICD-10-CM | POA: Insufficient documentation

## 2019-03-09 LAB — CBC WITH DIFFERENTIAL/PLATELET
Abs Immature Granulocytes: 0.01 10*3/uL (ref 0.00–0.07)
Basophils Absolute: 0 10*3/uL (ref 0.0–0.1)
Basophils Relative: 1 %
Eosinophils Absolute: 0.2 10*3/uL (ref 0.0–0.5)
Eosinophils Relative: 3 %
HCT: 24.9 % — ABNORMAL LOW (ref 36.0–46.0)
Hemoglobin: 7.7 g/dL — ABNORMAL LOW (ref 12.0–15.0)
Immature Granulocytes: 0 %
Lymphocytes Relative: 13 %
Lymphs Abs: 0.7 10*3/uL (ref 0.7–4.0)
MCH: 36 pg — ABNORMAL HIGH (ref 26.0–34.0)
MCHC: 30.9 g/dL (ref 30.0–36.0)
MCV: 116.4 fL — ABNORMAL HIGH (ref 80.0–100.0)
Monocytes Absolute: 0.6 10*3/uL (ref 0.1–1.0)
Monocytes Relative: 10 %
Neutro Abs: 4.3 10*3/uL (ref 1.7–7.7)
Neutrophils Relative %: 73 %
Platelets: 353 10*3/uL (ref 150–400)
RBC: 2.14 MIL/uL — ABNORMAL LOW (ref 3.87–5.11)
RDW: 17.6 % — ABNORMAL HIGH (ref 11.5–15.5)
WBC: 5.9 10*3/uL (ref 4.0–10.5)
nRBC: 0 % (ref 0.0–0.2)

## 2019-03-09 LAB — COMPREHENSIVE METABOLIC PANEL
ALT: 7 U/L (ref 0–44)
AST: 15 U/L (ref 15–41)
Albumin: 2.9 g/dL — ABNORMAL LOW (ref 3.5–5.0)
Alkaline Phosphatase: 71 U/L (ref 38–126)
Anion gap: 10 (ref 5–15)
BUN: 15 mg/dL (ref 8–23)
CO2: 24 mmol/L (ref 22–32)
Calcium: 8.4 mg/dL — ABNORMAL LOW (ref 8.9–10.3)
Chloride: 103 mmol/L (ref 98–111)
Creatinine, Ser: 0.97 mg/dL (ref 0.44–1.00)
GFR calc Af Amer: >60 mL/min (ref >60)
GFR calc non Af Amer: 59 mL/min — ABNORMAL LOW (ref >60)
Glucose, Bld: 93 mg/dL (ref 70–99)
Potassium: 3.1 mmol/L — ABNORMAL LOW (ref 3.5–5.1)
Sodium: 137 mmol/L (ref 135–145)
Total Bilirubin: 0.7 mg/dL (ref 0.3–1.2)
Total Protein: 6.9 g/dL (ref 6.5–8.1)

## 2019-03-09 MED ORDER — SODIUM CHLORIDE 0.9 % IV SOLN
Freq: Once | INTRAVENOUS | Status: AC
  Administered 2019-03-09: 11:00:00 via INTRAVENOUS

## 2019-03-09 MED ORDER — SODIUM CHLORIDE 0.9 % IV SOLN
Freq: Once | INTRAVENOUS | Status: AC
  Administered 2019-03-09: 11:00:00 via INTRAVENOUS
  Filled 2019-03-09: qty 5

## 2019-03-09 MED ORDER — PROCHLORPERAZINE MALEATE 10 MG PO TABS: 10.0000 mg | ORAL_TABLET | Freq: Four times a day (QID) | ORAL | 1 refills | Status: DC | PRN

## 2019-03-09 MED ORDER — POTASSIUM CHLORIDE CRYS ER 10 MEQ PO TBCR
40.0000 meq | EXTENDED_RELEASE_TABLET | Freq: Once | ORAL | Status: AC
  Administered 2019-03-09: 40 meq via ORAL
  Filled 2019-03-09: qty 4

## 2019-03-09 MED ORDER — SODIUM CHLORIDE 0.9% FLUSH: 10.0000 mL | INTRAVENOUS | Status: DC | PRN

## 2019-03-09 MED ORDER — SODIUM CHLORIDE 0.9 % IV SOLN
325.5000 mg | Freq: Once | INTRAVENOUS | Status: AC
  Administered 2019-03-09: 330 mg via INTRAVENOUS
  Filled 2019-03-09: qty 33

## 2019-03-09 MED ORDER — HEPARIN SOD (PORK) LOCK FLUSH 100 UNIT/ML IV SOLN: 500.0000 [IU] | Freq: Once | INTRAVENOUS | Status: DC | PRN

## 2019-03-09 MED ORDER — SODIUM CHLORIDE 0.9 % IV SOLN
Freq: Once | INTRAVENOUS | Status: AC
  Administered 2019-03-09: 10:00:00 via INTRAVENOUS

## 2019-03-09 MED ORDER — HEPARIN SOD (PORK) LOCK FLUSH 100 UNIT/ML IV SOLN
500.0000 [IU] | Freq: Once | INTRAVENOUS | Status: AC | PRN
  Administered 2019-03-09: 500 [IU]

## 2019-03-09 MED ORDER — LIDOCAINE-PRILOCAINE 2.5-2.5 % EX CREA: TOPICAL_CREAM | CUTANEOUS | 3 refills | Status: DC

## 2019-03-09 MED ORDER — SODIUM CHLORIDE 0.9% FLUSH
10.0000 mL | Freq: Once | INTRAVENOUS | Status: AC | PRN
  Administered 2019-03-09: 10 mL

## 2019-03-09 MED ORDER — SODIUM CHLORIDE 0.9 % IV SOLN
100.0000 mg/m2 | Freq: Once | INTRAVENOUS | Status: AC
  Administered 2019-03-09: 150 mg via INTRAVENOUS
  Filled 2019-03-09: qty 7.5

## 2019-03-09 MED ORDER — PALONOSETRON HCL INJECTION 0.25 MG/5ML
0.2500 mg | Freq: Once | INTRAVENOUS | Status: AC
  Administered 2019-03-09: 0.25 mg via INTRAVENOUS
  Filled 2019-03-09: qty 5

## 2019-03-09 MED ORDER — SODIUM CHLORIDE 0.9 % IV SOLN
510.0000 mg | Freq: Once | INTRAVENOUS | Status: AC
  Administered 2019-03-09: 510 mg via INTRAVENOUS
  Filled 2019-03-09: qty 510

## 2019-03-09 NOTE — Progress Notes (Signed)
Small Cell Lung - No Medical Intervention - Off Treatment.  Patient Characteristics: Newly Diagnosed, Preoperative or Nonsurgical Candidate (Clinical Staging), First Line, Limited Stage, Surgical Candidate Therapeutic Status: Newly Diagnosed, Preoperative or Nonsurgical Candidate (Clinical Staging) AJCC T Category: cT3 AJCC N Category: cN1 AJCC M Category: cM0 AJCC 8 Stage Grouping: IIIA Stage Classification: Limited Surgical Candidacy: Surgical Candidate

## 2019-03-09 NOTE — Progress Notes (Signed)
Patient presents today for treatment C1 D1 Etoposide and Carboplatin. Vital signs within parameters for treatment. Labs pending. MAR reviewed. Pt has no complaints of any changes since the last visit. Pt denies any pain today. Pt states constipation is an ongoing problem with her.   Message received from [facility] LPN. Proceed with treatment. Feraheme today with treatment. OnPro on Wednesday after treatment prior to discharge. Labs reviewed by Dr. Delton Coombes. HBG 7.7 and potassium 3.1. MD aware. HGB 8.6 on 03/02/19. Message received to give 40 meq of potassium PO prior to treatment.   Treatment given today per MD orders. Tolerated infusion without adverse affects. Vital signs stable. No complaints at this time. Discharged from clinic via wheel chair. F/U with [facility] as scheduled.

## 2019-03-09 NOTE — Assessment & Plan Note (Signed)
1.  Small cell lung cancer: -PET scan on 10/24/2018 shows hypermetabolic right upper lobe pulmonary nodules, suspicious for synchronous primary. -Core needle biopsy of the right upper lobe on 11/21/2018 consistent with small cell carcinoma. -Right upper lobectomy on 12/30/2018 shows invasive small cell carcinoma, 4.2 cm, involving visceral pleura, 1 out of 3 lymph nodes positive, lymphovascular invasion positive.  Metastatic carcinoma in 2, 11R lymph nodes present.  Right chest wall biopsy consistent with small cell carcinoma. -She received 30 Gray in 10 fractions from 01/28/2019 through 02/10/2019. -PET scan on 01/26/2019 showed dense consolidative airspace disease in the posterior right base, markedly hypermetabolic SUV 8.  Loculated anterior pleural fluid in the right hemithorax with adjacent airspace consolidation is hypermetabolic with SUV 4.1.  Hypermetabolic uptake identified along the right lung staple lines.  2.5 cm focus of soft tissue in the right axilla with SUV 2.2.  9 mm short axis precarinal lymph node with SUV 2.5.  12 mm short axis subcarinal lymph node with SUV 3. -The PET scan findings were unclear if there are postsurgical.  Clinical stage is 3. -I discussed the CT chest dated 03/04/2019 findings which were stable.  Borderline mediastinal lymph nodes present. -We talked about starting her on adjuvant chemotherapy with carboplatin and VP-16.  I do not believe she is a candidate for cisplatin. -We discussed the schedule and side effects of this treatment in detail.  I reviewed labs.  She will proceed with her first treatment today.  We will plan to repeat her labs next week to see if she needs any transfusion.  2.  Essential thrombocytosis: -She was taking hydroxyurea 500 mg Tuesday to Friday and 1000 mg on Saturday, Sunday and Monday. -Hydrea was held on 03/02/2019.  3.  Weight loss: -Weight today is one 106.9.  She used to be around 112. -She is eating 2 meals per day.  I plan to  start her on Megace if her weight drops below 105.  3.  Macrocytic anemia: -Hemoglobin today 7.7.  MCV is high.  Hydrea was discontinued at last visit. -We reviewed results of ferritin and iron panel which are slightly low.  She has CKD.  I have recommended parenteral iron therapy. -We discussed side effects of Feraheme in detail.  We will give her 1 g of Feraheme.

## 2019-03-09 NOTE — Progress Notes (Signed)
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  No Medical Intervention - Off Treatment.  REASON: Other Reason PRIOR TREATMENT: Lung300: Referral to Surgery  START ON PATHWAY REGIMEN - Small Cell Lung     A cycle is every 21 days:     Etoposide      Carboplatin   **Always confirm dose/schedule in your pharmacy ordering system**  Patient Characteristics: Postoperative (Pathologic Staging), Adjuvant Therapy Therapeutic Status: Postoperative (Pathologic Staging) AJCC T Category: pT3 AJCC N Category: pN1 AJCC M Category: cM0 AJCC 8 Stage Grouping: IIIA Intent of Therapy: Curative Intent, Discussed with Patient

## 2019-03-09 NOTE — Progress Notes (Signed)
03/09/19  Potassium level 3.1 - give potassium chloride 40 meq orally x 1 today in clinic  Give OnPro 03/11/19 verses injection due to holiday.   T.O. Dr Beckey Downing LPN/Maiko Salais Ronnald Ramp, PharmD

## 2019-03-09 NOTE — Progress Notes (Signed)
[place], [place] [address]   CLINIC:  Medical Oncology/Hematology  PCP:  [name], NP [address] [place] [address] [phone]   REASON FOR VISIT: Small cell lung cancer, essential thrombocytosis.  CURRENT THERAPY:Carboplatin and VP-16.    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 The 3 liver lesions are not hypermetabolic. The radiologist suspects they may be subtly present on prior CT chest from 10/2013, to further reassuring that these are likely benign lesions.   Melanoma ([facility])  07/09/2013 Initial Biopsy   Initial biopsy L upper thigh/buttocks melanoma   07/20/2013 Surgery   Excision L lateral buttock/upper thigh melanoma, clear margins   07/20/2013 Pathology Results   Breslow depth 1.02 mm, pT2a    Small cell lung carcinoma, right ([place])  11/27/2018 Initial Diagnosis   Small cell lung carcinoma, right ([facility])   03/09/2019 -  Chemotherapy   The patient had palonosetron (ALOXI) injection 0.25 mg, 0.25 mg, Intravenous,  Once, 1 of 4 cycles Administration: 0.25 mg (03/09/2019) pegfilgrastim (NEULASTA ONPRO KIT) injection 6 mg, 6 mg, Subcutaneous, Once, 1 of 1 cycle pegfilgrastim-jmdb (FULPHILA) injection 6 mg, 6 mg, Subcutaneous,  Once, 0 of 3 cycles CARBOplatin (PARAPLATIN) 330 mg in sodium chloride 0.9 % 250 mL chemo infusion, 330 mg (100 % of original dose 325.5 mg), Intravenous,  Once, 1 of 4 cycles Dose modification:   (original  dose 325.5 mg, Cycle 1) Administration: 330 mg (03/09/2019) etoposide (VEPESID) 150 mg in sodium chloride 0.9 % 500 mL chemo infusion, 100 mg/m2 = 150 mg, Intravenous,  Once, 1 of 4 cycles Administration: 150 mg (03/09/2019) fosaprepitant (EMEND) 150 mg, dexamethasone (DECADRON) 12 mg in sodium chloride 0.9 % 145 mL IVPB, , Intravenous,  Once, 1 of 4 cycles Administration:  (03/09/2019)  for chemotherapy treatment.    03/09/2019 Cancer Staging   Staging form: Lung, AJCC 8th Edition - Clinical: Stage IIIA (cT3, cN1, cM0) - Signed by Derek Jack, MD on 03/09/2019      INTERVAL HISTORY:  Ms. Bayley 68 y.o. female seen for follow-up of small cell lung cancer.  She is accompanied by her daughter today.  She had CT scan done on 03/04/2019.  Reported appetite of 50%.  Energy levels are 25%.  No pain is reported.  Constipation was positive.  Last BM was yesterday.  Denies any fevers or chills.  Denies any headaches or vision changes.  REVIEW OF SYSTEMS:  Review of Systems  Gastrointestinal: Positive for constipation.  All other systems reviewed and are negative.    PAST MEDICAL/SURGICAL HISTORY:  Past Medical History:  Diagnosis Date  . 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 Anginal pain ([facility])   . Arthritis   . Ascending aortic aneurysm ([facility])   . Cancer [facility]) 1995   breast, left, mastectomy/chemo  . Chest pain    Possibly cardiac. No evidence of ischemia/injury based upon normal troponin I. Chest discomfort could be tachycardia  induced supply demand mismatch.   . CHF (congestive heart failure) ([facility]) 11/17/2015   after surgery   . Colon adenomas   . Coronary artery disease   . DJD (degenerative joint disease)   . Dyspnea    with exertion  . Emphysema of lung ([facility]) 11/17/2015  . Essential hypertension, benign   . GERD (gastroesophageal reflux disease)   . History of hiatal hernia   . Hyperlipidemia   . Hypertension   . Melanoma ([facility]) 01/09/2016  . Osteopenia   .  Palpitations   . Pernicious anemia 03/06/2016  . Pernicious anemia   . Pure hypercholesterolemia   . Raynaud's disease   . Thrombocythemia, essential ([facility]) 01/09/2016  . Thrombocytosis ([facility])    Idiopathic  . Vitamin D deficiency    Past Surgical History:  Procedure Laterality Date  . ABDOMINAL HYSTERECTOMY    . ANTERIOR AND POSTERIOR REPAIR N/A 12/09/2014   Procedure: ANTERIOR (CYSTOCELE) AND POSTERIOR REPAIR (RECTOCELE);  Surgeon: Bjorn Loser, MD;  Location: [facility];  Service: Urology;  Laterality: N/A;  . ANTERIOR CERVICAL DECOMPRESSION/DISCECTOMY FUSION 4 LEVELS Right 10/03/2016   Procedure: ANTERIOR CERVICAL DECOMPRESSION FUSION, CERVICAL 4-5, CERVICAL 5-6, CERVICAL 6-7, CERVICAL 7 TO THORACIC 1 WITH INSTRUMENTATION AND ALLOGRAFT;  Surgeon: Phylliss Bob, MD;  Location: [facility];  Service: Orthopedics;  Laterality: Right;  ANTERIOR CERVICAL DECOMPRESSION FUSION, CERVICAL 4-5, CERVICAL 5-6, CERVICAL 6-7, CERVICAL 7 TO THORACIC 1 WITH INSTRUMENTATION AND ALLOGRAFT; REQUEST 4 HO  . ANTERIOR LAT LUMBAR FUSION Left 11/16/2015   Procedure: LEFT SIDED LATERAL INTERBODY FUSION, LUMBAR 2-3, LUMBAR 3-4, LUMBAR 4-5 WITH INSTRUMENTATION;  Surgeon: Phylliss Bob, MD;  Location: [facility];  Service: Orthopedics;  Laterality: Left;  LEFT SIDED LATEARL INTERBODY FUSION, LUMBAR 2-3, LUMBAR 3-4, LUMBAR 4-5 WITH INSTRUMENTATION   . APPENDECTOMY    . BACK SURGERY    . BONE MARROW ASPIRATION  07/2012  . BONE MARROW BIOPSY  07/2012  . BREAST SURGERY    . CARDIAC CATHETERIZATION    . CARDIAC CATHETERIZATION N/A 01/20/2016   Procedure: Left Heart Cath and Coronary Angiography;  Surgeon: Burnell Blanks, MD;  Location: [facility];  Service: Cardiovascular;  Laterality: N/A;  . COLONOSCOPY  11/29/2010   Procedure: COLONOSCOPY;  Surgeon: Rogene Houston, MD;  [facility];  Service: Endoscopy;  Laterality: N/A;  . COLONOSCOPY N/A 02/18/2014   Procedure: COLONOSCOPY;  Surgeon: Rogene Houston,  MD;  [facility];  Service: Endoscopy;  Laterality: N/A;  1030  . COLONOSCOPY N/A 02/25/2017   Procedure: COLONOSCOPY;  Surgeon: Rogene Houston, MD;  [facility];  Service: Endoscopy;  Laterality: N/A;  10:55  . CYSTOSCOPY N/A 12/09/2014   Procedure: CYSTOSCOPY;  Surgeon: Bjorn Loser, MD;  Location: [facility];  Service: Urology;  Laterality: N/A;  . ESOPHAGEAL DILATION N/A 02/25/2017   Procedure: ESOPHAGEAL DILATION;  Surgeon: Rogene Houston, MD;  [facility];  Service: Endoscopy;  Laterality: N/A;  . ESOPHAGOGASTRODUODENOSCOPY N/A 02/25/2017   Procedure: ESOPHAGOGASTRODUODENOSCOPY (EGD);  Surgeon: Rogene Houston, MD;  [facility];  Service: Endoscopy;  Laterality: N/A;  . IR GENERIC HISTORICAL  01/11/2016   IR RADIOLOGY PERIPHERAL GUIDED IV START 01/11/2016 Saverio Danker, PA-C [facility]  . IR GENERIC HISTORICAL  01/11/2016   IR US GUIDE VASC ACCESS RIGHT 01/11/2016 Saverio Danker, PA-C [facility]  . LYMPH NODE DISSECTION Right 12/30/2018   Procedure: Lymph Node Dissection;  Surgeon: Lajuana Matte, MD;  Location: [facility];  Service: Thoracic;  Laterality: Right;  . MASTECTOMY     left  . OVARIAN CYST SURGERY     x2  . POLYPECTOMY  02/25/2017   Procedure: POLYPECTOMY;  Surgeon: Rogene Houston, MD;  [facility];  Service: Endoscopy;;  . PORTACATH PLACEMENT Left 02/16/2019   Procedure: INSERTION PORT-A-CATH (CATHETER  LEFT SUBCLAVIAN);  Surgeon: Aviva Signs, MD;  [facility];  Service: General;  Laterality: Left;  . SALPINGOOPHORECTOMY Bilateral 12/09/2014   Procedure: SALPINGO OOPHORECTOMY;  Surgeon: Servando Salina, MD;  Location: [facility];  Service: Gynecology;  Laterality: Bilateral;  . TUBAL LIGATION    . VAGINAL HYSTERECTOMY N/A 12/09/2014   Procedure: HYSTERECTOMY VAGINAL;  Surgeon: Servando Salina, MD;  Location: [facility];  Service: Gynecology;  Laterality: N/A;  . VIDEO ASSISTED THORACOSCOPY (VATS)/  LOBECTOMY Right 12/30/2018   Procedure: VIDEO ASSISTED THORACOSCOPY (VATS)/RIGHT LOWER LOBE WEDGE RESECTION, RIGHT UPPER LOBECTOMY;  Surgeon: Lajuana Matte, MD;  Location: [facility];  Service: Thoracic;  Laterality: Right;  Marland Kitchen VIDEO BRONCHOSCOPY N/A 12/30/2018   Procedure: VIDEO BRONCHOSCOPY;  Surgeon: Lajuana Matte, MD;  [facility];  Service: Thoracic;  Laterality: N/A;     SOCIAL HISTORY:  Social History   Socioeconomic History  . Marital status: Divorced    Spouse name: Not on file  . Number of children: 2  . Years of education: Not on file  . Highest education level: Not on file  Occupational History  . Not on file  Social Needs  . Financial resource strain: Not on file  . Food insecurity    Worry: Not on file    Inability: Not on file  . Transportation needs    Medical: Not on file    Non-medical: Not on file  Tobacco Use  . Smoking status: Former Smoker    Packs/day: 0.50    Years: 50.00    Pack years: 25.00    Types: Cigarettes    Quit date: 11/25/2018    Years since quitting: 0.2  . Smokeless tobacco: Never Used  Substance and Sexual Activity  . Alcohol use: Yes    Comment: occasional  . Drug use: No  . Sexual activity: Yes    Birth control/protection: Post-menopausal  Lifestyle  . Physical activity    Days per week: Not on file    Minutes per session: Not on file  . Stress: Not on file  Relationships  . Social Herbalist on phone: Not on file    Gets together: Not on file    Attends religious service: Not on file    Active member of club or organization: Not on file    Attends meetings of clubs or organizations: Not on file    Relationship status: Not on file  . Intimate partner violence    Fear of current or ex partner: Not on file    Emotionally abused: Not on file    Physically abused: Not on file    Forced sexual activity: Not on file  Other Topics Concern  . Not on file  Social History Narrative  . Not on file     FAMILY HISTORY:  Family History  Problem Relation Age of Onset  . Hypertension Mother   . Heart failure Mother   . Congestive Heart Failure Mother   . COPD Mother   . Pernicious anemia Mother   . Cancer Mother        lung  . Hypertension Father   .  CAD Father   . Heart attack Father   . Hypertension Sister   . Cancer Other   . Celiac disease Other     CURRENT MEDICATIONS:  Outpatient Encounter Medications as of 03/09/2019  Medication Sig  . albuterol (PROVENTIL HFA;VENTOLIN HFA) 108 (90 Base) MCG/ACT inhaler Inhale 2 puffs into the lungs every 6 (six) hours as needed for wheezing or shortness of breath.  Marland Kitchen aspirin EC 81 MG tablet Take 81 mg by mouth daily.  . Calcium Carb-Cholecalciferol (CALCIUM 600+D) 600-800 MG-UNIT TABS Take 1 tablet by mouth 2 (two) times daily.  Marland Kitchen CARBOPLATIN IV Inject into the vein every 21 ( twenty-one) days.   . Cholecalciferol (VITAMIN D-3) 1000 units CAPS Take 1,000 Units daily by mouth.   . clonazePAM (KLONOPIN) 0.5 MG tablet Take 1 tablet (0.5 mg total) by mouth at bedtime. If take this at bedtime, do NOT take an Oxycodone at night  . cyanocobalamin (,VITAMIN B-12,) 1000 MCG/ML injection INJECT 1 ML INTO THE MUSCLE ONCE MONTHLY AS DIRECTED. (Patient taking differently: Inject 1,000 mcg into the muscle every 30 (thirty) days. )  . ETOPOSIDE IV Inject into the vein every 21 ( twenty-one) days.  Marland Kitchen gabapentin (NEURONTIN) 300 MG capsule Take 300 mg by mouth at bedtime.  Marland Kitchen guaiFENesin (MUCINEX) 600 MG 12 hr tablet Take 1 tablet (600 mg total) by mouth 2 (two) times daily as needed for cough or to loosen phlegm.  . hydroxyurea (HYDREA) 500 MG capsule TAKE 1 CAPSULE DAILY. Please contact medical doctor who prescribed this as creatinine elevated at 1.52 on 01/07/2019. (Patient taking differently: Take 500 mg by mouth See admin instructions. Take 500 mg by mouth on Tuesday, Wednesday, Thursday and Friday, take 1000 mg Sunday, Monday and Saturday.)  . ibandronate  (BONIVA) 150 MG tablet Take 150 mg by mouth every 30 (thirty) days.   Marland Kitchen ibuprofen (ADVIL,MOTRIN) 800 MG tablet Take 800 mg every 8 (eight) hours as needed by mouth for moderate pain.  Marland Kitchen ipratropium-albuterol (DUONEB) 0.5-2.5 (3) MG/3ML SOLN Take 3 mLs by nebulization every 6 (six) hours as needed (shortness of breath).  . lidocaine-prilocaine (EMLA) cream Apply a small amount to port a cath site and cover with plastic wrap 1 hour prior to chemotherapy appointments  . methocarbamol (ROBAXIN) 500 MG tablet Take 500 mg by mouth every 6 (six) hours as needed for muscle spasms.   . ondansetron (ZOFRAN) 8 MG tablet Take 1 tablet (8 mg total) by mouth every 8 (eight) hours as needed for nausea or vomiting.  . OXYGEN Inhale 3 L into the lungs daily.  . pantoprazole (PROTONIX) 40 MG tablet Take 40 mg daily by mouth.  . polyethylene glycol (MIRALAX / GLYCOLAX) 17 g packet Take 17 g by mouth daily as needed for mild constipation.  . prochlorperazine (COMPAZINE) 10 MG tablet Take 1 tablet (10 mg total) by mouth every 6 (six) hours as needed for nausea or vomiting.  . rosuvastatin (CRESTOR) 40 MG tablet Take 40 mg at bedtime by mouth.    No facility-administered encounter medications on file as of 03/09/2019.     ALLERGIES:  Allergies  Allergen Reactions  . Penicillins Hives and Rash    Did it involve swelling of the face/tongue/throat, SOB, or low BP? No Did it involve sudden or severe rash/hives, skin peeling, or any reaction on the inside of your mouth or nose? No Did you need to seek medical attention at a hospital or doctor's office? No When did it last happen?5-10 year  If all above answers are "NO", may proceed with cephalosporin use.    . Tape Rash    Medipore, Coban, and paper tape CAN be tolerated     PHYSICAL EXAM:  ECOG Performance status: 1  Vitals:   03/09/19 0832  BP: 138/84  Pulse: 69  Resp: 16  Temp: (!) 96.9 F (36.1 C)  SpO2: 98%   Filed Weights   03/09/19 0832   Weight: 106 lb 14.4 oz (48.5 kg)    Physical Exam Constitutional:      Appearance: Normal appearance. She is normal weight.  Cardiovascular:     Rate and Rhythm: Normal rate and regular rhythm.     Heart sounds: Normal heart sounds.  Pulmonary:     Effort: Pulmonary effort is normal.     Breath sounds: Normal breath sounds.  Abdominal:     General: Abdomen is flat. There is no distension.     Palpations: Abdomen is soft. There is no mass.  Musculoskeletal: Normal range of motion.  Skin:    General: Skin is warm and dry.  Neurological:     Mental Status: She is alert and oriented to person, place, and time. Mental status is at baseline.  Psychiatric:        Mood and Affect: Mood normal.        Behavior: Behavior normal.        Thought Content: Thought content normal.        Judgment: Judgment normal.      LABORATORY DATA:  I have reviewed the labs as listed.  CBC    Component Value Date/Time   WBC 5.9 03/09/2019 0758   RBC 2.14 (L) 03/09/2019 0758   HGB 7.7 (L) 03/09/2019 0758   HCT 24.9 (L) 03/09/2019 0758   PLT 353 03/09/2019 0758   MCV 116.4 (H) 03/09/2019 0758   MCH 36.0 (H) 03/09/2019 0758   MCHC 30.9 03/09/2019 0758   RDW 17.6 (H) 03/09/2019 0758   LYMPHSABS 0.7 03/09/2019 0758   MONOABS 0.6 03/09/2019 0758   EOSABS 0.2 03/09/2019 0758   BASOSABS 0.0 03/09/2019 0758   CMP Latest Ref Rng & Units 03/09/2019 03/02/2019 01/20/2019  Glucose 70 - 99 mg/dL 93 117(H) 122(H)  BUN 8 - 23 mg/dL _0 Creatinine 0.44 - 1.00 mg/dL 0.97 1.22(H) 1.32(H)  Sodium 135 - 145 mmol/L 137 137 134(L)  Potassium 3.5 - 5.1 mmol/L 3.1(L) 3.4(L) 4.8  Chloride 98 - 111 mmol/L 103 103 106  CO2 22 - 32 mmol/L _1 Calcium 8.9 - 10.3 mg/dL 8.4(L) 8.6(L) 9.3  Total Protein 6.5 - 8.1 g/dL 6.9 7.1 6.8  Total Bilirubin 0.3 - 1.2 mg/dL 0.7 0.6 0.5  Alkaline Phos 38 - 126 U/L 71 77 89  AST 15 - 41 U/L 15 18 40  ALT 0 - 44 U/L _2 DIAGNOSTIC IMAGING:  I have  independently reviewed the scans and discussed with the patient.     ASSESSMENT & PLAN:   Small cell lung carcinoma, right ([facility]) 1.  Small cell lung cancer: -PET scan on 10/24/2018 shows hypermetabolic right upper lobe pulmonary nodules, suspicious for synchronous primary. -Core needle biopsy of the right upper lobe on 11/21/2018 consistent with small cell carcinoma. -Right upper lobectomy on 12/30/2018 shows invasive small cell carcinoma, 4.2 cm, involving visceral pleura, 1 out of 3 lymph nodes positive, lymphovascular invasion positive.  Metastatic carcinoma in 2, 11R lymph nodes present.  Right  chest wall biopsy consistent with small cell carcinoma. -She received 30 Gray in 10 fractions from 01/28/2019 through 02/10/2019. -PET scan on 01/26/2019 showed dense consolidative airspace disease in the posterior right base, markedly hypermetabolic SUV 8.  Loculated anterior pleural fluid in the right hemithorax with adjacent airspace consolidation is hypermetabolic with SUV 4.1.  Hypermetabolic uptake identified along the right lung staple lines.  2.5 cm focus of soft tissue in the right axilla with SUV 2.2.  9 mm short axis precarinal lymph node with SUV 2.5.  12 mm short axis subcarinal lymph node with SUV 3. -The PET scan findings were unclear if there are postsurgical.  Clinical stage is 3. -I discussed the CT chest dated 03/04/2019 findings which were stable.  Borderline mediastinal lymph nodes present. -We talked about starting her on adjuvant chemotherapy with carboplatin and VP-16.  I do not believe she is a candidate for cisplatin. -We discussed the schedule and side effects of this treatment in detail.  I reviewed labs.  She will proceed with her first treatment today.  We will plan to repeat her labs next week to see if she needs any transfusion.  2.  Essential thrombocytosis: -She was taking hydroxyurea 500 mg Tuesday to Friday and 1000 mg on Saturday, Sunday and Monday. -Hydrea was held on  03/02/2019.  3.  Weight loss: -Weight today is one 106.9.  She used to be around 112. -She is eating 2 meals per day.  I plan to start her on Megace if her weight drops below 105.  3.  Macrocytic anemia: -Hemoglobin today 7.7.  MCV is high.  Hydrea was discontinued at last visit. -We reviewed results of ferritin and iron panel which are slightly low.  She has CKD.  I have recommended parenteral iron therapy. -We discussed side effects of Feraheme in detail.  We will give her 1 g of Feraheme.  Total time spent is 40 minutes with more than 50% of time spent face-to-face discussing new treatment plan, counseling and coordination of care.  Orders placed this encounter:  Orders Placed This Encounter  Procedures  . CBC with Differential/Platelet  . Comprehensive metabolic panel  . Magnesium      Derek Jack, MD [place] [phone]

## 2019-03-09 NOTE — Patient Instructions (Signed)
[facility] Discharge Instructions for Patients Receiving Chemotherapy  Today you received the following chemotherapy agents   To help prevent nausea and vomiting after your treatment, we encourage you to take your nausea medication   If you develop nausea and vomiting that is not controlled by your nausea medication, call the clinic.   BELOW ARE SYMPTOMS THAT SHOULD BE REPORTED IMMEDIATELY:  *FEVER GREATER THAN 100.5 F  *CHILLS WITH OR WITHOUT FEVER  NAUSEA AND VOMITING THAT IS NOT CONTROLLED WITH YOUR NAUSEA MEDICATION  *UNUSUAL SHORTNESS OF BREATH  *UNUSUAL BRUISING OR BLEEDING  TENDERNESS IN MOUTH AND THROAT WITH OR WITHOUT PRESENCE OF ULCERS  *URINARY PROBLEMS  *BOWEL PROBLEMS  UNUSUAL RASH Items with * indicate a potential emergency and should be followed up as soon as possible.  Feel free to call the clinic should you have any questions or concerns. The clinic phone number is [phone].  Please show the CHEMO ALERT CARD at check-in to the Emergency Department and triage nurse.   

## 2019-03-09 NOTE — Patient Instructions (Addendum)
[place] at [facility] Discharge Instructions  You were seen today by Dr. Delton Coombes. He went over your recent lab results. He will give you your first treatment today as well as an IV iron infusion. He will see you back in 1 week for labs, Iron infusion and follow up.   Thank you for choosing [place] at [facility] to provide your oncology and hematology care.  To afford each patient quality time with our provider, please arrive at least 15 minutes before your scheduled appointment time.   If you have a lab appointment with the [facility] please come in thru the  Main Entrance and check in at the main information desk  You need to re-schedule your appointment should you arrive 10 or more minutes late.  We strive to give you quality time with our providers, and arriving late affects you and other patients whose appointments are after yours.  Also, if you no show three or more times for appointments you may be dismissed from the clinic at the providers discretion.     Again, thank you for choosing [facility].  Our hope is that these requests will decrease the amount of time that you wait before being seen by our physicians.       _____________________________________________________________  Should you have questions after your visit to [facility], please contact our office at [phone] between the hours of 8:00 a.m. and 4:30 p.m.  Voicemails left after 4:00 p.m. will not be returned until the following business day.  For prescription refill requests, have your pharmacy contact our office and allow 72 hours.    Cancer Center Support Programs:   > Cancer Support Group  2nd Tuesday of the month 1pm-2pm, Journey Room

## 2019-03-10 ENCOUNTER — Inpatient Hospital Stay (HOSPITAL_COMMUNITY): Payer: Medicare Other

## 2019-03-10 ENCOUNTER — Ambulatory Visit: Payer: Medicare Other

## 2019-03-10 ENCOUNTER — Other Ambulatory Visit: Payer: Self-pay

## 2019-03-10 ENCOUNTER — Inpatient Hospital Stay (HOSPITAL_COMMUNITY): Payer: Medicare Other | Admitting: General Practice

## 2019-03-10 VITALS — BP 127/71 | HR 71 | Temp 97.7°F | Resp 18

## 2019-03-10 DIAGNOSIS — C3491 Malignant neoplasm of unspecified part of right bronchus or lung: Secondary | ICD-10-CM

## 2019-03-10 DIAGNOSIS — Z5111 Encounter for antineoplastic chemotherapy: Secondary | ICD-10-CM | POA: Diagnosis not present

## 2019-03-10 MED ORDER — HEPARIN SOD (PORK) LOCK FLUSH 100 UNIT/ML IV SOLN
500.0000 [IU] | Freq: Once | INTRAVENOUS | Status: AC | PRN
  Administered 2019-03-10: 500 [IU]

## 2019-03-10 MED ORDER — SODIUM CHLORIDE 0.9 % IV SOLN
10.0000 mg | Freq: Once | INTRAVENOUS | Status: AC
  Administered 2019-03-10: 10 mg via INTRAVENOUS
  Filled 2019-03-10: qty 10

## 2019-03-10 MED ORDER — SODIUM CHLORIDE 0.9% FLUSH
10.0000 mL | INTRAVENOUS | Status: DC | PRN
  Administered 2019-03-10: 10 mL
  Filled 2019-03-10: qty 10

## 2019-03-10 MED ORDER — SODIUM CHLORIDE 0.9 % IV SOLN
100.0000 mg/m2 | Freq: Once | INTRAVENOUS | Status: AC
  Administered 2019-03-10: 150 mg via INTRAVENOUS
  Filled 2019-03-10: qty 7.5

## 2019-03-10 MED ORDER — SODIUM CHLORIDE 0.9 % IV SOLN
Freq: Once | INTRAVENOUS | Status: AC
  Administered 2019-03-10: 13:00:00 via INTRAVENOUS

## 2019-03-10 NOTE — Progress Notes (Signed)
Patient presents today for Day 2 VP -16. Patient has no complaints of any changes since the last visit. Pt denies any pain at present. Pt states she had some sweating during the night. Vital signs within parameters for treatment.

## 2019-03-10 NOTE — Progress Notes (Signed)
Ashley [name] tolerated VP-16 infusion without incident or complaint. VSS. Discharged via wheelchair in satisfactory condition.

## 2019-03-10 NOTE — Patient Instructions (Signed)
[facility] Discharge Instructions for Patients Receiving Chemotherapy   Beginning January 23rd 2017 lab work for the [facility] will be done in the  Main lab at [facility] on 1st floor. If you have a lab appointment with the [facility] please come in thru the  Main Entrance and check in at the main information desk   Today you received the following chemotherapy agents VP-16.  To help prevent nausea and vomiting after your treatment, we encourage you to take your nausea medication If you develop nausea and vomiting, or diarrhea that is not controlled by your medication, call the clinic.  The clinic phone number is [phone]. Office hours are Monday-Friday 8:30am-5:00pm.  BELOW ARE SYMPTOMS THAT SHOULD BE REPORTED IMMEDIATELY:  *FEVER GREATER THAN 101.0 F  *CHILLS WITH OR WITHOUT FEVER  NAUSEA AND VOMITING THAT IS NOT CONTROLLED WITH YOUR NAUSEA MEDICATION  *UNUSUAL SHORTNESS OF BREATH  *UNUSUAL BRUISING OR BLEEDING  TENDERNESS IN MOUTH AND THROAT WITH OR WITHOUT PRESENCE OF ULCERS  *URINARY PROBLEMS  *BOWEL PROBLEMS  UNUSUAL RASH Items with * indicate a potential emergency and should be followed up as soon as possible. If you have an emergency after office hours please contact your primary care physician or go to the nearest emergency department.  Please call the clinic during office hours if you have any questions or concerns.   You may also contact the Patient Navigator at [phone] should you have any questions or need assistance in obtaining follow up care.      Resources For Cancer Patients and their Caregivers ? American Cancer Society: Can assist with transportation, wigs, general needs, runs Look Good Feel Better.        510 491 5591 ? Cancer Care: Provides financial assistance, online support groups, medication/co-pay assistance.  1-800-813-HOPE 380-191-6825) ? [place] Assists [place] cancer  patients and their families through emotional , educational and financial support.  807 598 0077 ? [place] DSS Where to apply for food stamps, Medicaid and utility assistance. 906-546-5646 ? [facility]: Transportation to medical appointments. 351-269-4843 ? Social Security Administration: May apply for disability if have a Stage IV cancer. 252-727-6339 (986) 747-9921 ? [employer], Disability and Transit Services: Assists with nutrition, care and transit needs. 920-834-0149

## 2019-03-10 NOTE — Progress Notes (Signed)
[place] Initial Psychosocial Assessment Clinical Social Work  Clinical Social Work contacted by phone to assess psychosocial, emotional, mental health, and spiritual needs of the patient. Patient and daughter participated in today's visit.   Barriers to care/review of distress screen:  - Transportation:  Do you anticipate any problems getting to appointments?  Do you have someone who can help run errands for you if you need it? Multiple family members available to assist w transportation - daughter accompanied her today and participated in the interview.   - Help at home:  What is your living situation (alone, family, other)?  If you are physically unable to care for yourself, who would you call on to help you?  Daughter and grandson live w her - patient owns the home, family members have lived with her for many years, provide mutual support.  70 year old grandson is attentive to patient's needs as well.   - Support system:  What does your support system look like?  Who would you call on if you needed some kind of practical help?  What if you needed someone to talk to for emotional support?  Multiple family members, including adult daughters (one an Therapist, sports, the other a hairdresser), are engaged w providing care for patient.  Niece has also moved in with family to help w virtual schooling process for grandson so that daughter can be free to assist/accompany mother to multiple appointments.  One daughter is a Marine scientist at [employer]. Finances:  Are you concerned about finances.  Considering returning to work?  If not, applying for disability?  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  Has adequate insurance and does not struggle w meeting her financial needs a this time.   What is your understanding of where you are with your cancer? Its cause?  Your treatment plan and what happens next?  Has been diagnosed and treated for cancer 6 times -  breast cancer survivor of approx 25 years, also has had lymphoma, melanoma, skin cancers and new diagnosis of lung cancer.  Is in regular surveillance as cancer survivor.  Current issue with lung cancer was followed because patient herself read her scan results and noted a "spiculated" mass, leading her to request more aggressive follow up from MD, leading to additional tests which revealed current diagnosis of lung cancer.  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  She is able to review her scans/tests and can be encouraged to verbalize her questions/concerns when she notes a concerning finding.  Her daughter is also a 10 year colon cancer survivor, initially diagnosed just after the birth of her now 59 year old son.  Both patient and daughter have been through surgery, radiation and chemotherapy.  They are able to mutually support each other through this process.  Patient notes that many years ago, her home was tested for radon as part of a state surveillance program.  Her home was found to have an unusually high level of radon - and now she has been diagnosed with 6 different kinds of cancer, and her daughter has also been diagnosed with cancer.  She wonders if there is a connection between this exposure and her current cancer situation.    What are your worries for the future as you begin treatment for cancer?  As a nurse, she is aware of  the implications of being diagnosed w small cell lung cancer.  She would like to know more about her "prognosis", this would help her in planning how to wisely use her time. She is aware of the implications of this diagnosis.  She would also like to know the stage of her lung cancer.   What are your hopes and priorities during your treatment? What is important to you? What are your goals for your care?  Relies on her family for support and care,  appreciates when her family is able to be included in visits.  May have difficulty verbalizing her concerns and questions - may tend to hold back or not be able to verbalize a question during the appointment.  It is important for providers to make sure she has an accurate understanding of what has been said during the visit.  As a nurse, she finds that medical providers often either assume that because she is a Marine scientist, "she knows this" and dont give full information OR they use language that is complicated and specific to oncology which is not her field of nursing.  Overall, she would like to have an accurate understanding of her cancer, its stage, her prognosis and anything that she can do to help herself in her own care.  Her family also appreciates this information.  She also has other chronic health conditions which are causing concern - she has been told her kidney function is decreasing and is concerned that chemotherapy may further damage her kidneys.  She has a PCP at [facility] as well as a cardiologist and oncologist.  Her care seems somewhat fragmented at this point, and she hopes to connect w her PCP to review her medications and plans in order to best care for herself as she goes through cancer treatment.    What are you NOT willing to sacrifice during your treatment?  Involvement of her family in her care   CSW Summary:  Patient and family psychosocial functioning including strengths, limitations, and coping skills:  Patient is a "veteran" of cancer diagnosis and treatment, having been through treatment for 6 different kinds of cancer in her past beginning approx 25 years ago.  Her family is actively involved in support her through this current challenge of cancer.  As a retired Marine scientist, she has a good understanding of the disease process and wants to get good information that she can understand and use.  She does appreciate having family members included in medical discussions -  especially when being given difficult to digest information on her diagnosis/prognosis or change in treatment plan.    Identifications of barriers to care:  Struggling to eat per her daughter - while patient says she is eating, per daughter the family is concerned because she eats very small amounts of food.  They are looking to purchase nutritional supplements which would provide both hydration and nutrition.  Family given information on where to obtain these via consult w dietitian.  Lives in [place] where these resources may be more scarce in local stores/pharmacies.  Has little information on her diagnosis of small cell lung cancer - provided information on [facility] [place] which is a credible source of information and support.    Availability of community resources:  PCP is [facility], she feels well cared for by this facility.  With the multiple appointments and treatments associated w this new diagnosis, she could not attend a recent appointment.  She will  reschedule and reconnect w PCP.    Clinical Social Worker follow up needed: No.  Family has my contact information and have been encouraged to connect as needed for support/resources.  Edwyna Shell, LCSW Clinical Social Worker Phone:  [phone]

## 2019-03-11 ENCOUNTER — Inpatient Hospital Stay (HOSPITAL_COMMUNITY): Payer: Medicare Other

## 2019-03-11 VITALS — BP 126/80 | HR 58 | Temp 97.5°F | Resp 16

## 2019-03-11 DIAGNOSIS — Z5111 Encounter for antineoplastic chemotherapy: Secondary | ICD-10-CM | POA: Diagnosis not present

## 2019-03-11 DIAGNOSIS — C3491 Malignant neoplasm of unspecified part of right bronchus or lung: Secondary | ICD-10-CM

## 2019-03-11 MED ORDER — HEPARIN SOD (PORK) LOCK FLUSH 100 UNIT/ML IV SOLN
500.0000 [IU] | Freq: Once | INTRAVENOUS | Status: AC | PRN
  Administered 2019-03-11: 500 [IU]

## 2019-03-11 MED ORDER — PEGFILGRASTIM 6 MG/0.6ML ~~LOC~~ PSKT
6.0000 mg | PREFILLED_SYRINGE | Freq: Once | SUBCUTANEOUS | Status: AC
  Administered 2019-03-11: 6 mg via SUBCUTANEOUS

## 2019-03-11 MED ORDER — SODIUM CHLORIDE 0.9% FLUSH
10.0000 mL | INTRAVENOUS | Status: DC | PRN
  Administered 2019-03-11: 10 mL
  Filled 2019-03-11: qty 10

## 2019-03-11 MED ORDER — PEGFILGRASTIM 6 MG/0.6ML ~~LOC~~ PSKT
PREFILLED_SYRINGE | SUBCUTANEOUS | Status: AC
  Filled 2019-03-11: qty 0.6

## 2019-03-11 MED ORDER — SODIUM CHLORIDE 0.9 % IV SOLN
10.0000 mg | Freq: Once | INTRAVENOUS | Status: AC
  Administered 2019-03-11: 10 mg via INTRAVENOUS
  Filled 2019-03-11: qty 10

## 2019-03-11 MED ORDER — SODIUM CHLORIDE 0.9 % IV SOLN
100.0000 mg/m2 | Freq: Once | INTRAVENOUS | Status: AC
  Administered 2019-03-11: 150 mg via INTRAVENOUS
  Filled 2019-03-11: qty 7.5

## 2019-03-11 MED ORDER — SODIUM CHLORIDE 0.9 % IV SOLN
Freq: Once | INTRAVENOUS | Status: AC
  Administered 2019-03-11: 13:00:00 via INTRAVENOUS

## 2019-03-11 NOTE — Patient Instructions (Signed)

## 2019-03-11 NOTE — Progress Notes (Signed)
Patient presents today for Day 3 VP-16. Pt has no complaints of any changes since her last visit. Vital signs within parameters for treatment.   Treatment given today per MD orders. Tolerated infusion without adverse affects. Vital signs stable. No complaints at this time. Discharged from clinic via wheel chair. On-Pro on the patient's R arm. Green light flashing.  F/U with [facility] as scheduled.

## 2019-03-13 ENCOUNTER — Ambulatory Visit (HOSPITAL_COMMUNITY): Payer: Medicare Other

## 2019-03-16 ENCOUNTER — Ambulatory Visit
Admission: RE | Admit: 2019-03-16 | Discharge: 2019-03-16 | Disposition: A | Discharge status: Home / Self Care | Payer: Medicare Other | Source: Ambulatory Visit | Attending: Radiation Oncology | Admitting: Radiation Oncology

## 2019-03-16 ENCOUNTER — Encounter: Payer: Self-pay | Admitting: Radiation Oncology

## 2019-03-16 ENCOUNTER — Other Ambulatory Visit: Payer: Self-pay

## 2019-03-16 ENCOUNTER — Telehealth (HOSPITAL_COMMUNITY): Payer: Self-pay

## 2019-03-16 VITALS — BP 113/82 | HR 89 | Temp 98.7°F | Resp 24 | Wt 106.2 lb

## 2019-03-16 DIAGNOSIS — I700 Atherosclerosis of aorta: Secondary | ICD-10-CM | POA: Diagnosis not present

## 2019-03-16 DIAGNOSIS — Z79899 Other long term (current) drug therapy: Secondary | ICD-10-CM | POA: Insufficient documentation

## 2019-03-16 DIAGNOSIS — Z923 Personal history of irradiation: Secondary | ICD-10-CM | POA: Insufficient documentation

## 2019-03-16 DIAGNOSIS — Z7982 Long term (current) use of aspirin: Secondary | ICD-10-CM | POA: Diagnosis not present

## 2019-03-16 DIAGNOSIS — Z853 Personal history of malignant neoplasm of breast: Secondary | ICD-10-CM | POA: Diagnosis not present

## 2019-03-16 DIAGNOSIS — J439 Emphysema, unspecified: Secondary | ICD-10-CM | POA: Diagnosis not present

## 2019-03-16 DIAGNOSIS — C3411 Malignant neoplasm of upper lobe, right bronchus or lung: Secondary | ICD-10-CM | POA: Diagnosis not present

## 2019-03-16 DIAGNOSIS — Z9012 Acquired absence of left breast and nipple: Secondary | ICD-10-CM | POA: Insufficient documentation

## 2019-03-16 DIAGNOSIS — C3491 Malignant neoplasm of unspecified part of right bronchus or lung: Secondary | ICD-10-CM

## 2019-03-16 NOTE — Progress Notes (Signed)
Radiation Oncology         [phone] ________________________________  Name: Ashley [name] MRN: [id number]  Date: 03/16/2019  DOB: 03-Aug-1950  Follow-Up Visit Note  CC: [name], NP  Lajuana Matte, MD    ICD-10-CM   1. 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  Radiation Treatment Dates: 01/28/2019 through 02/10/2019 Site Technique Total Dose (Gy) Dose per Fx (Gy) Completed Fx Beam Energies  Thorax: Lung_Rt 3D 30/30 3 10/10 6X, 10X    Narrative:  The patient returns today for routine follow-up. She is doing well overall. Since end of treatment, patient followed up with Dr. Delton Coombes on 03/09/2019 and was started on Etoposide and Carboplatin.   On review of systems, patient reports significant side effects related to her Neulasta. Patient denies chest pain or breathing difficulties.  Does state some dyspnea with exertion                            ALLERGIES:  is allergic to penicillins and tape.  Meds: Current Outpatient Medications  Medication Sig Dispense Refill   albuterol (PROVENTIL HFA;VENTOLIN HFA) 108 (90 Base) MCG/ACT inhaler Inhale 2 puffs into the lungs every 6 (six) hours as needed for wheezing or shortness of breath. 1 Inhaler 2   aspirin EC 81 MG tablet Take 81 mg by mouth daily.     Calcium Carb-Cholecalciferol (CALCIUM 600+D) 600-800 MG-UNIT TABS Take 1 tablet by mouth 2 (two) times daily.     CARBOPLATIN IV Inject into the vein every 21 ( twenty-one) days.      Cholecalciferol (VITAMIN D-3) 1000 units CAPS Take 1,000 Units daily by mouth.      clonazePAM (KLONOPIN) 0.5 MG tablet Take 1 tablet (0.5 mg total) by mouth at bedtime. If take this at bedtime, do NOT take an Oxycodone at night 90 tablet 2   cyanocobalamin (,VITAMIN B-12,) 1000 MCG/ML injection INJECT 1 ML INTO THE MUSCLE ONCE MONTHLY AS  DIRECTED. (Patient taking differently: Inject 1,000 mcg into the muscle every 30 (thirty) days. ) 1 mL 5   ETOPOSIDE IV Inject into the vein every 21 ( twenty-one) days.     gabapentin (NEURONTIN) 300 MG capsule Take 300 mg by mouth at bedtime.     guaiFENesin (MUCINEX) 600 MG 12 hr tablet Take 1 tablet (600 mg total) by mouth 2 (two) times daily as needed for cough or to loosen phlegm.     ibandronate (BONIVA) 150 MG tablet Take 150 mg by mouth every 30 (thirty) days.      ibuprofen (ADVIL,MOTRIN) 800 MG tablet Take 800 mg every 8 (eight) hours as needed by mouth for moderate pain.     ipratropium-albuterol (DUONEB) 0.5-2.5 (3) MG/3ML SOLN Take 3 mLs by nebulization every 6 (six) hours as needed (shortness of breath). 360 mL 0   lidocaine-prilocaine (EMLA) cream Apply a small amount to port a cath site and cover with plastic wrap 1 hour prior to chemotherapy appointments 30 g 3   methocarbamol (ROBAXIN) 500 MG tablet Take 500 mg by mouth every 6 (six) hours as needed for muscle spasms.      ondansetron (ZOFRAN) 8 MG tablet Take 1 tablet (8 mg total) by mouth every 8 (eight) hours as needed for nausea or vomiting. 20 tablet 5  OXYGEN Inhale 3 L into the lungs daily.     pantoprazole (PROTONIX) 40 MG tablet Take 40 mg daily by mouth.     polyethylene glycol (MIRALAX / GLYCOLAX) 17 g packet Take 17 g by mouth daily as needed for mild constipation.     prochlorperazine (COMPAZINE) 10 MG tablet Take 1 tablet (10 mg total) by mouth every 6 (six) hours as needed (Nausea or vomiting). 30 tablet 1   rosuvastatin (CRESTOR) 40 MG tablet Take 40 mg at bedtime by mouth.      hydroxyurea (HYDREA) 500 MG capsule TAKE 1 CAPSULE DAILY. Please contact medical doctor who prescribed this as creatinine elevated at 1.52 on 01/07/2019. (Patient not taking: Reported on 03/16/2019) 120 capsule 4   No current facility-administered medications for this encounter.     Physical Findings: The patient is in  no acute distress. Patient is alert and oriented.  weight is 106 lb 3.2 oz (48.2 kg). Her temperature is 98.7 F (37.1 C). Her blood pressure is 113/82 and her pulse is 89. Her respiration is 24 (abnormal) and oxygen saturation is 100%. .   Lungs are clear to auscultation bilaterally. Heart has regular rate and rhythm. No palpable cervical, supraclavicular, or axillary adenopathy. Abdomen soft, non-tender, normal bowel sounds.   Lab Findings: Lab Results  Component Value Date   WBC 5.9 03/09/2019   HGB 7.7 (L) 03/09/2019   HCT 24.9 (L) 03/09/2019   MCV 116.4 (H) 03/09/2019   PLT 353 03/09/2019    Radiographic Findings: Ct Chest W Contrast  Result Date: 03/05/2019 CLINICAL DATA:  Lung cancer with RIGHT lower lobe wedge resection and RIGHT upper lobectomy. EXAM: CT CHEST WITH CONTRAST TECHNIQUE: Multidetector CT imaging of the chest was performed during intravenous contrast administration. CONTRAST:  72mL OMNIPAQUE IOHEXOL 300 MG/ML  SOLN COMPARISON:  PET-CT 01/26/2019 FINDINGS: Cardiovascular: Ascending thoracic aorta measures 41 mm. Bovine arch variant great vessel anatomy. Port in the anterior chest wall with tip in distal SVC. Mediastinum/Nodes: No axillary adenopathy. Nodal dissection clips in the LEFT and RIGHT axilla. Post LEFT mastectomy and breast prosthetic. No supraclavicular or mediastinal lymphadenopathy. Subcarinal lymph node measures 10 mm which is upper limits of normal and compared to 10 mm on comparison exam. Lungs/Pleura: Irregular consolidation in the posterior RIGHT lower lobe with air bronchograms appears slightly less dense than comparison exam. No discrete nodularity. There is loculated fluid in the RIGHT lung apex without enhancing nodularity. Loculated fluid collection in the medial aspect of the RIGHT middle lobe measuring 3.5 by 3.4 cm is unchanged from comparison exam. There is a thin enhancing rim to this collection which was hypermetabolic on comparison exam. There is  peripheral calcifications associated with this collection additionally. No hilar adenopathy. Review of the lung parenchyma demonstrates no discrete nodularity. Centrilobular emphysema noted Upper Abdomen: RIGHT adrenal gland is enlarged. Lesions low density consistent benign adenoma. Mild thickening of the LEFT adrenal gland similar. No hypermetabolic activity on comparison PET-CT scan. Limited view of the liver is unremarkable Musculoskeletal: Posterior lumbar fusion. No aggressive osseous lesion IMPRESSION: 1. Stable loculated fluid collection in the medial aspect of the RIGHT middle lobe with thin enhancing rim. 2. Stable loculated fluid in the RIGHT lung apex. 3. Irregular consolidation in the posterior RIGHT lower lobe appears slightly less dense than comparison exam favor inflammatory. 4. Stable borderline enlarged subcarinal lymph node. 5. Benign adrenal nodularity. 6. Aortic Atherosclerosis (ICD10-I70.0) and Emphysema (ICD10-J43.9). Electronically Signed   By: Suzy Bouchard M.D.   On: 03/05/2019  09:24   Dg Chest Port 1 View  Result Date: 02/16/2019 CLINICAL DATA:  Left-sided Port-A-Cath placement. EXAM: PORTABLE CHEST 1 VIEW COMPARISON:  01/16/2019 and PET-CT 01/26/2019 FINDINGS: Left subclavian Port-A-Cath has tip over the SVC. Patient slightly rotated to the right. Stable right perihilar opacification and right basilar opacification. Stable right apical opacification likely pleural fluid tracking to the apex as seen on CT. Surgical sutures over the right hilar region. No right-sided pneumothorax. Left lung essentially clear. Slight cardiomediastinal shift to the right unchanged. Remainder of the exam is unchanged. IMPRESSION: Postsurgical change over the right hilar region with stable right perihilar and basilar opacification as seen on recent PET-CT. Fluid tracking to the right apex unchanged. Interval placement of left subclavian Port-A-Cath with tip over the SVC. No pneumothorax. Electronically  Signed   By: Marin Olp M.D.   On: 02/16/2019 08:18   Dg C-arm 1-60 Min-no Report  Result Date: 02/16/2019 Fluoroscopy was utilized by the requesting physician.  No radiographic interpretation.    Impression: Clinically stable.  The patient will continue on chemotherapy as planned.  Plan: As needed follow-up in radiation oncology. scheduled to follow up with Dr. Delton Coombes on 03/18/2019.  I discussed prophylactic cranial radiation therapy briefly today.  She will discuss further with Dr. Delton Coombes if she obtains complete remission.  ____________________________________   Blair Promise, PhD, MD  This document serves as a record of services personally performed by Gery Pray, MD. It was created on his behalf by Clerance Lav, a trained medical scribe. The creation of this record is based on the scribe's personal observations and the provider's statements to them. This document has been checked and approved by the attending provider.

## 2019-03-16 NOTE — Progress Notes (Signed)
Pt presents today for f/u with Dr. Sondra Come. Pt reports pain in right lower chest, rated 6/10, intermittent in nature. Pt reports having bad pain reaction to Neulasta, despite APAP and Claritin. Pt reports SOB with walking short distances but is able to catch breath quickly upon rest. Denies cough, hemoptysis. Pt reports difficulty swallowing, attributed to past neck surgery, not radiation. Pt has supplemental oxygen at home but does not need as frequently as prior to radiation.   BP 113/82 (BP Location: Right Arm, Patient Position: Sitting, Cuff Size: Normal)   Pulse 89   Temp 98.7 F (37.1 C)   Resp (!) 24   Wt 106 lb 3.2 oz (48.2 kg)   SpO2 100%   BMI 19.42 kg/m   Wt Readings from Last 3 Encounters:  03/16/19 106 lb 3.2 oz (48.2 kg)  03/09/19 106 lb 14.4 oz (48.5 kg)  03/02/19 107 lb 9.6 oz (48.8 kg)   Loma Sousa, RN BSN

## 2019-03-16 NOTE — Patient Instructions (Signed)
Coronavirus (COVID-19) Are you at risk?  Are you at risk for the Coronavirus (COVID-19)?  To be considered HIGH RISK for Coronavirus (COVID-19), you have to meet the following criteria:  . Traveled to [place], [place], [place], [place] or [place]; or in the [place] to [place], [place], [place], or [place]; and have fever, cough, and shortness of breath within the last 2 weeks of travel OR . Been in close contact with a person diagnosed with COVID-19 within the last 2 weeks and have fever, cough, and shortness of breath . IF YOU DO NOT MEET THESE CRITERIA, YOU ARE CONSIDERED LOW RISK FOR COVID-19.  What to do if you are HIGH RISK for COVID-19?  . If you are having a medical emergency, call 911. . Seek medical care right away. Before you go to a doctor's office, urgent care or emergency department, call ahead and tell them about your recent travel, contact with someone diagnosed with COVID-19, and your symptoms. You should receive instructions from your physician's office regarding next steps of care.  . When you arrive at healthcare provider, tell the healthcare staff immediately you have returned from visiting [place], [place], [place], [place] or [place]; or traveled in the [place] to [place], [place], [place], or [place]; in the last two weeks or you have been in close contact with a person diagnosed with COVID-19 in the last 2 weeks.   . Tell the health care staff about your symptoms: fever, cough and shortness of breath. . After you have been seen by a medical provider, you will be either: o Tested for (COVID-19) and discharged home on quarantine except to seek medical care if symptoms worsen, and asked to  - Stay home and avoid contact with others until you get your results (4-5 days)  - Avoid travel on public transportation if possible (such as bus, train, or airplane) or o Sent to the Emergency Department by EMS for evaluation, COVID-19 testing, and possible  admission depending on your condition and test results.  What to do if you are LOW RISK for COVID-19?  Reduce your risk of any infection by using the same precautions used for avoiding the common cold or flu:  . Wash your hands often with soap and warm water for at least 20 seconds.  If soap and water are not readily available, use an alcohol-based hand sanitizer with at least 60% alcohol.  . If coughing or sneezing, cover your mouth and nose by coughing or sneezing into the elbow areas of your shirt or coat, into a tissue or into your sleeve (not your hands). . Avoid shaking hands with others and consider head nods or verbal greetings only. . Avoid touching your eyes, nose, or mouth with unwashed hands.  . Avoid close contact with people who are sick. . Avoid places or events with large numbers of people in one location, like concerts or sporting events. . Carefully consider travel plans you have or are making. . If you are planning any travel outside or inside the [place], visit the CDC's Travelers' Health webpage for the latest health notices. . If you have some symptoms but not all symptoms, continue to monitor at home and seek medical attention if your symptoms worsen. . If you are having a medical emergency, call 911.   ADDITIONAL HEALTHCARE OPTIONS FOR PATIENTS  [facility] / e-Visit: [email or web address]         [facility]: [phone]  [facility]: [phone]                   [facility]: [phone]   

## 2019-03-16 NOTE — Progress Notes (Signed)
24 hour call back completed. Patient states she had body aches form the Neulasta On -Pro . Pt teaching performed. Patient states the Claritin didn't help with the aches. Pt denies any nausea, vomiting, or pain. Patient states she has been eating and drinking well and comes back for a follow up appointment on Wednesday.

## 2019-03-17 ENCOUNTER — Other Ambulatory Visit (HOSPITAL_COMMUNITY): Payer: Self-pay | Admitting: Hematology

## 2019-03-18 ENCOUNTER — Inpatient Hospital Stay (HOSPITAL_COMMUNITY): Payer: Medicare Other

## 2019-03-18 ENCOUNTER — Encounter (HOSPITAL_COMMUNITY): Payer: Self-pay | Admitting: Hematology

## 2019-03-18 ENCOUNTER — Other Ambulatory Visit: Payer: Self-pay

## 2019-03-18 ENCOUNTER — Inpatient Hospital Stay (HOSPITAL_COMMUNITY): Payer: Medicare Other | Attending: Hematology | Admitting: Hematology

## 2019-03-18 VITALS — BP 124/60 | HR 75 | Temp 97.5°F | Resp 18

## 2019-03-18 DIAGNOSIS — Z85820 Personal history of malignant melanoma of skin: Secondary | ICD-10-CM | POA: Insufficient documentation

## 2019-03-18 DIAGNOSIS — Z79899 Other long term (current) drug therapy: Secondary | ICD-10-CM | POA: Diagnosis not present

## 2019-03-18 DIAGNOSIS — C3491 Malignant neoplasm of unspecified part of right bronchus or lung: Secondary | ICD-10-CM

## 2019-03-18 DIAGNOSIS — Z90721 Acquired absence of ovaries, unilateral: Secondary | ICD-10-CM | POA: Insufficient documentation

## 2019-03-18 DIAGNOSIS — N1831 Chronic kidney disease, stage 3a: Secondary | ICD-10-CM

## 2019-03-18 DIAGNOSIS — D510 Vitamin B12 deficiency anemia due to intrinsic factor deficiency: Secondary | ICD-10-CM

## 2019-03-18 DIAGNOSIS — Z853 Personal history of malignant neoplasm of breast: Secondary | ICD-10-CM | POA: Insufficient documentation

## 2019-03-18 DIAGNOSIS — D473 Essential (hemorrhagic) thrombocythemia: Secondary | ICD-10-CM | POA: Insufficient documentation

## 2019-03-18 DIAGNOSIS — Z9221 Personal history of antineoplastic chemotherapy: Secondary | ICD-10-CM | POA: Diagnosis not present

## 2019-03-18 DIAGNOSIS — Z5111 Encounter for antineoplastic chemotherapy: Secondary | ICD-10-CM | POA: Diagnosis not present

## 2019-03-18 LAB — CBC WITH DIFFERENTIAL/PLATELET
Abs Immature Granulocytes: 0 10*3/uL (ref 0.00–0.07)
Basophils Absolute: 0 10*3/uL (ref 0.0–0.1)
Basophils Relative: 2 %
Eosinophils Absolute: 0 10*3/uL (ref 0.0–0.5)
Eosinophils Relative: 3 %
HCT: 22.7 % — ABNORMAL LOW (ref 36.0–46.0)
Hemoglobin: 6.9 g/dL — CL (ref 12.0–15.0)
Immature Granulocytes: 0 %
Lymphocytes Relative: 60 %
Lymphs Abs: 0.7 10*3/uL (ref 0.7–4.0)
MCH: 34.7 pg — ABNORMAL HIGH (ref 26.0–34.0)
MCHC: 30.4 g/dL (ref 30.0–36.0)
MCV: 114.1 fL — ABNORMAL HIGH (ref 80.0–100.0)
Monocytes Absolute: 0.3 10*3/uL (ref 0.1–1.0)
Monocytes Relative: 23 %
Neutro Abs: 0.1 10*3/uL — ABNORMAL LOW (ref 1.7–7.7)
Neutrophils Relative %: 12 %
Platelets: 94 10*3/uL — ABNORMAL LOW (ref 150–400)
RBC: 1.99 MIL/uL — ABNORMAL LOW (ref 3.87–5.11)
RDW: 16.2 % — ABNORMAL HIGH (ref 11.5–15.5)
WBC: 1.1 10*3/uL — CL (ref 4.0–10.5)
nRBC: 0 % (ref 0.0–0.2)

## 2019-03-18 LAB — COMPREHENSIVE METABOLIC PANEL
ALT: 11 U/L (ref 0–44)
AST: 13 U/L — ABNORMAL LOW (ref 15–41)
Albumin: 3 g/dL — ABNORMAL LOW (ref 3.5–5.0)
Alkaline Phosphatase: 62 U/L (ref 38–126)
Anion gap: 10 (ref 5–15)
BUN: 14 mg/dL (ref 8–23)
CO2: 23 mmol/L (ref 22–32)
Calcium: 9 mg/dL (ref 8.9–10.3)
Chloride: 103 mmol/L (ref 98–111)
Creatinine, Ser: 0.95 mg/dL (ref 0.44–1.00)
GFR calc Af Amer: >60 mL/min (ref >60)
GFR calc non Af Amer: >60 mL/min (ref >60)
Glucose, Bld: 122 mg/dL — ABNORMAL HIGH (ref 70–99)
Potassium: 3.3 mmol/L — ABNORMAL LOW (ref 3.5–5.1)
Sodium: 136 mmol/L (ref 135–145)
Total Bilirubin: 0.5 mg/dL (ref 0.3–1.2)
Total Protein: 6.2 g/dL — ABNORMAL LOW (ref 6.5–8.1)

## 2019-03-18 LAB — ABO/RH: ABO/RH(D): AB POS

## 2019-03-18 LAB — MAGNESIUM: Magnesium: 1.8 mg/dL (ref 1.7–2.4)

## 2019-03-18 LAB — PREPARE RBC (CROSSMATCH)

## 2019-03-18 MED ORDER — SODIUM CHLORIDE 0.9 % IV SOLN
Freq: Once | INTRAVENOUS | Status: AC
  Administered 2019-03-18: 12:00:00 via INTRAVENOUS

## 2019-03-18 MED ORDER — SODIUM CHLORIDE 0.9% IV SOLUTION
250.0000 mL | Freq: Once | INTRAVENOUS | Status: AC
  Administered 2019-03-18: 250 mL via INTRAVENOUS

## 2019-03-18 MED ORDER — SODIUM CHLORIDE 0.9% FLUSH
10.0000 mL | INTRAVENOUS | Status: AC | PRN
  Administered 2019-03-18: 10 mL

## 2019-03-18 MED ORDER — DIPHENHYDRAMINE HCL 25 MG PO CAPS
25.0000 mg | ORAL_CAPSULE | Freq: Once | ORAL | Status: AC
  Administered 2019-03-18: 25 mg via ORAL
  Filled 2019-03-18: qty 1

## 2019-03-18 MED ORDER — SODIUM CHLORIDE 0.9 % IV SOLN
510.0000 mg | Freq: Once | INTRAVENOUS | Status: AC
  Administered 2019-03-18: 510 mg via INTRAVENOUS
  Filled 2019-03-18: qty 510

## 2019-03-18 MED ORDER — HEPARIN SOD (PORK) LOCK FLUSH 100 UNIT/ML IV SOLN
500.0000 [IU] | Freq: Every day | INTRAVENOUS | Status: AC | PRN
  Administered 2019-03-18: 500 [IU]

## 2019-03-18 MED ORDER — ACETAMINOPHEN 325 MG PO TABS
650.0000 mg | ORAL_TABLET | Freq: Once | ORAL | Status: AC
  Administered 2019-03-18: 650 mg via ORAL
  Filled 2019-03-18: qty 2

## 2019-03-18 NOTE — Progress Notes (Signed)
CRITICAL VALUE ALERT Critical value received:  HGB 6.9/ WBC 1.1 Date of notification:  03/18/2019 Time of notification: 2341 Critical value read back:  Yes.   Nurse who received alert:  Isidoro Donning RN Dr. Raliegh Ip notified of lab results and [facility]. Dr. Raliegh Ip to get with Theadora Rama to let her know what he wants to do for patient.

## 2019-03-18 NOTE — Patient Instructions (Signed)
[place] at [facility] Discharge Instructions  You were seen today by Dr. Delton Coombes. He went over your recent lab results. You will get 1 unit of blood today. He will see you back as scheduled for labs, treatment and follow up.   Thank you for choosing [facility] at [facility] to provide your oncology and hematology care.  To afford each patient quality time with our provider, please arrive at least 15 minutes before your scheduled appointment time.   If you have a lab appointment with the [facility] please come in thru the  Main Entrance and check in at the main information desk  You need to re-schedule your appointment should you arrive 10 or more minutes late.  We strive to give you quality time with our providers, and arriving late affects you and other patients whose appointments are after yours.  Also, if you no show three or more times for appointments you may be dismissed from the clinic at the providers discretion.     Again, thank you for choosing [facility].  Our hope is that these requests will decrease the amount of time that you wait before being seen by our physicians.       _____________________________________________________________  Should you have questions after your visit to [facility], please contact our office at [phone] between the hours of 8:00 a.m. and 4:30 p.m.  Voicemails left after 4:00 p.m. will not be returned until the following business day.  For prescription refill requests, have your pharmacy contact our office and allow 72 hours.    Cancer Center Support Programs:   > Cancer Support Group  2nd Tuesday of the month 1pm-2pm, Journey Room

## 2019-03-18 NOTE — Assessment & Plan Note (Addendum)
1.  Stage III (T3N1) small cell lung cancer: -Right upper lobectomy on 12/30/2018 shows invasive small cell carcinoma, 4.2 cm, involving visceral pleura, 1 out of 3 lymph nodes positive, lymphovascular invasion positive, metastatic carcinoma in 2, 11 R lymph nodes.  Right chest wall biopsy consistent with small cell carcinoma. -30 Gy of radiation in 10 fractions from 01/28/2019 through 02/10/2019. -PET scan on 01/26/2019 shows dense consolidative airspace disease in the posterior right base, markedly hypermetabolic SUV 8.  Loculated anterior pleural fluid in the right hemithorax with adjacent airspace consolidation is hypermetabolic with SUV 4.1.  Hypermetabolic uptake identified along the right lung staple lines.  2.5 cm focus of soft tissue in the right axilla with SUV 2.2.  Precarinal lymph node, short axis IX millimeters with SUV 2.5.  12 mm short axis subcarinal lymph node with SUV 3. -CT scan dated on 02/22/2019 showed stable findings.  Borderline mediastinal lymph nodes present. -Cycle 1 of chemotherapy with carboplatin VP-16 on 03/09/2019. -Review of labs today shows hemoglobin 6.9 and white count of 1.1 with ANC of 100.  She will receive 1 unit of PRBC. -She has reported severe body pains lasted 4 to 5 days after Neulasta on pro. She went to Claritin. -We will discontinue Neulasta on pro and give her regular Fulphila. -She will come back on 03/30/2019 for her cycle two.  2.  Macrocytic anemia: -Etiology CKD and relative iron deficiency. -She received first infusion of Feraheme on 03/09/2019 and tolerated well.  3.  Weight loss: -Weight today has improved to 107.2.  Her usual weight is around 112. -We will plan to start Megace if her weight drops below 105.  4.  Essential thrombocytosis: -She was on Hydrea of 100 mg Tuesday through Friday and 1000 mg on Saturday, Sunday and Monday. -We held Hydrea since 03/02/2019. -She will proceed with her second cycle today.

## 2019-03-18 NOTE — Patient Instructions (Signed)
[facility]  Discharge Instructions:   _______________________________________________________________  Thank you for choosing [facility] at [facility] to provide your oncology and hematology care.  To afford each patient quality time with our providers, please arrive at least 15 minutes before your scheduled appointment.  You need to re-schedule your appointment if you arrive 10 or more minutes late.  We strive to give you quality time with our providers, and arriving late affects you and other patients whose appointments are after yours.  Also, if you no show three or more times for appointments you may be dismissed from the clinic.  Again, thank you for choosing [facility] at [facility]. Our hope is that these requests will allow you access to exceptional care and in a timely manner. _______________________________________________________________  If you have questions after your visit, please contact our office at [phone] between the hours of 8:30 a.m. and 5:00 p.m. Voicemails left after 4:30 p.m. will not be returned until the following business day. _______________________________________________________________  For prescription refill requests, have your pharmacy contact our office. _______________________________________________________________  Recommendations made by the consultant and any test results will be sent to your referring physician. _______________________________________________________________ 

## 2019-03-18 NOTE — Progress Notes (Signed)
[address] [place], [place] [address]   CLINIC:  Medical Oncology/Hematology  PCP:  [name], NP [address] [place] [address] [phone]   REASON FOR VISIT: Small cell lung cancer, essential thrombocytosis.  CURRENT THERAPY:Carboplatin and VP-16.    BRIEF ONCOLOGIC HISTORY:  Oncology History  Adenocarcinoma of left breast ([place])  09/29/1993 - 05/14/1994 Chemotherapy    AC Q 3 weeks X 6 cycles   01/02/1994 Surgery   Left modified radical mastectomy   01/02/1994 Pathology Results   ER-, PR - with a single positive LN found in the L axillae, Stage II disease   07/22/2015 Imaging   Bone Scan, No metastatic pattern uptake, degenerative changes in the lumbar spine with dextroscoliosis   05/25/2016 PET scan   No findings of active malignancy in the neck, chest, abdomen, or pelvis. The 3 liver lesions are not hypermetabolic. The radiologist suspects they may be subtly present on prior CT chest from 10/2013, to further reassuring that these are likely benign lesions.   Melanoma ([place])  07/09/2013 Initial Biopsy   Initial biopsy L upper thigh/buttocks melanoma   07/20/2013 Surgery   Excision L lateral buttock/upper thigh melanoma, clear margins   07/20/2013 Pathology Results   Breslow depth 1.02 mm, pT2a    Small cell lung carcinoma, right ([place])  11/27/2018 Initial Diagnosis   Small cell lung carcinoma, right ([place])   03/09/2019 -  Chemotherapy   The patient had palonosetron (ALOXI) injection 0.25 mg, 0.25 mg, Intravenous,  Once, 1 of 4 cycles Administration: 0.25 mg (03/09/2019) pegfilgrastim (NEULASTA ONPRO KIT) injection 6 mg, 6 mg, Subcutaneous, Once, 1 of 1 cycle Administration: 6 mg (03/11/2019) pegfilgrastim-jmdb (FULPHILA) injection 6 mg, 6 mg, Subcutaneous,  Once, 0 of 3 cycles CARBOplatin (PARAPLATIN) 330 mg in sodium chloride 0.9 % 250 mL chemo infusion, 330 mg (100 % of original dose 325.5 mg), Intravenous,  Once, 1 of 4 cycles  Dose modification:   (original dose 325.5 mg, Cycle 1) Administration: 330 mg (03/09/2019) etoposide (VEPESID) 150 mg in sodium chloride 0.9 % 500 mL chemo infusion, 100 mg/m2 = 150 mg, Intravenous,  Once, 1 of 4 cycles Administration: 150 mg (03/09/2019), 150 mg (03/10/2019), 150 mg (03/11/2019) fosaprepitant (EMEND) 150 mg, dexamethasone (DECADRON) 12 mg in sodium chloride 0.9 % 145 mL IVPB, , Intravenous,  Once, 1 of 4 cycles Administration:  (03/09/2019)  for chemotherapy treatment.    03/09/2019 Cancer Staging   Staging form: Lung, AJCC 8th Edition - Clinical: Stage IIIA (cT3, cN1, cM0) - Signed by Derek Jack, MD on 03/09/2019      INTERVAL HISTORY:  Ms. Capece 68 y.o. female seen for follow-up of small cell lung cancer and toxicity assessment.  She received cycle 1 on 03/09/2019.  She also received first cycle of Feraheme. She reported body pains predominantly musculoskeletal pains lasted 4 to 5 days after last Neulasta on pro. She has taken Claritin which did not help. He had some baseline nausea which has not worsened. Denied any vomiting. She reported some weakness. No bleeding per rectum or melena.  REVIEW OF SYSTEMS:  Review of Systems  Respiratory: Positive for shortness of breath.   Gastrointestinal: Positive for constipation and nausea.  Neurological: Positive for numbness.  Psychiatric/Behavioral: Positive for sleep disturbance.  All other systems reviewed and are negative.    PAST MEDICAL/SURGICAL HISTORY:  Past Medical History:  Diagnosis Date  . 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 Anginal pain ([place])   .  Arthritis   . Ascending aortic aneurysm ([place])   . Cancer [facility]) 1995   breast, left, mastectomy/chemo  . Chest pain    Possibly cardiac. No evidence of ischemia/injury based upon normal troponin I. Chest discomfort could be tachycardia induced supply demand mismatch.   . CHF (congestive heart failure) ([place]) 11/17/2015   after surgery   .  Colon adenomas   . Coronary artery disease   . DJD (degenerative joint disease)   . Dyspnea    with exertion  . Emphysema of lung ([place]) 11/17/2015  . Essential hypertension, benign   . GERD (gastroesophageal reflux disease)   . History of hiatal hernia   . Hyperlipidemia   . Hypertension   . Melanoma ([place]) 01/09/2016  . Osteopenia   . Palpitations   . Pernicious anemia 03/06/2016  . Pernicious anemia   . Pure hypercholesterolemia   . Raynaud's disease   . Thrombocythemia, essential ([place]) 01/09/2016  . Thrombocytosis (H[facility])    Idiopathic  . Vitamin D deficiency    Past Surgical History:  Procedure Laterality Date  . ABDOMINAL HYSTERECTOMY    . ANTERIOR AND POSTERIOR REPAIR N/A 12/09/2014   Procedure: ANTERIOR (CYSTOCELE) AND POSTERIOR REPAIR (RECTOCELE);  Surgeon: Bjorn Loser, MD;  Location: [facility];  Service: Urology;  Laterality: N/A;  . ANTERIOR CERVICAL DECOMPRESSION/DISCECTOMY FUSION 4 LEVELS Right 10/03/2016   Procedure: ANTERIOR CERVICAL DECOMPRESSION FUSION, CERVICAL 4-5, CERVICAL 5-6, CERVICAL 6-7, CERVICAL 7 TO THORACIC 1 WITH INSTRUMENTATION AND ALLOGRAFT;  Surgeon: Phylliss Bob, MD;  Location: [facility];  Service: Orthopedics;  Laterality: Right;  ANTERIOR CERVICAL DECOMPRESSION FUSION, CERVICAL 4-5, CERVICAL 5-6, CERVICAL 6-7, CERVICAL 7 TO THORACIC 1 WITH INSTRUMENTATION AND ALLOGRAFT; REQUEST 4 HO  . ANTERIOR LAT LUMBAR FUSION Left 11/16/2015   Procedure: LEFT SIDED LATERAL INTERBODY FUSION, LUMBAR 2-3, LUMBAR 3-4, LUMBAR 4-5 WITH INSTRUMENTATION;  Surgeon: Phylliss Bob, MD;  Location: [facility];  Service: Orthopedics;  Laterality: Left;  LEFT SIDED LATEARL INTERBODY FUSION, LUMBAR 2-3, LUMBAR 3-4, LUMBAR 4-5 WITH INSTRUMENTATION   . APPENDECTOMY    . BACK SURGERY    . BONE MARROW ASPIRATION  07/2012  . BONE MARROW BIOPSY  07/2012  . BREAST SURGERY    . CARDIAC CATHETERIZATION    . CARDIAC CATHETERIZATION N/A 01/20/2016   Procedure: Left Heart Cath and Coronary Angiography;   Surgeon: Burnell Blanks, MD;  Location: [facility];  Service: Cardiovascular;  Laterality: N/A;  . COLONOSCOPY  11/29/2010   Procedure: COLONOSCOPY;  Surgeon: Rogene Houston, MD;  [facility];  Service: Endoscopy;  Laterality: N/A;  . COLONOSCOPY N/A 02/18/2014   Procedure: COLONOSCOPY;  Surgeon: Rogene Houston, MD;  [facility];  Service: Endoscopy;  Laterality: N/A;  1030  . COLONOSCOPY N/A 02/25/2017   Procedure: COLONOSCOPY;  Surgeon: Rogene Houston, MD;  [facility];  Service: Endoscopy;  Laterality: N/A;  10:55  . CYSTOSCOPY N/A 12/09/2014   Procedure: CYSTOSCOPY;  Surgeon: Bjorn Loser, MD;  Location: [facility];  Service: Urology;  Laterality: N/A;  . ESOPHAGEAL DILATION N/A 02/25/2017   Procedure: ESOPHAGEAL DILATION;  Surgeon: Rogene Houston, MD;  [facility];  Service: Endoscopy;  Laterality: N/A;  . ESOPHAGOGASTRODUODENOSCOPY N/A 02/25/2017   Procedure: ESOPHAGOGASTRODUODENOSCOPY (EGD);  Surgeon: Rogene Houston, MD;  [facility];  Service: Endoscopy;  Laterality: N/A;  . IR GENERIC HISTORICAL  01/11/2016   IR RADIOLOGY PERIPHERAL GUIDED IV START 01/11/2016 Saverio Danker, PA-C [facility]  .  IR GENERIC HISTORICAL  01/11/2016   IR US GUIDE VASC ACCESS RIGHT 01/11/2016 Saverio Danker, PA-C [facility]  . LYMPH NODE DISSECTION Right 12/30/2018   Procedure: Lymph Node Dissection;  Surgeon: Lajuana Matte, MD;  Location: [facility];  Service: Thoracic;  Laterality: Right;  . MASTECTOMY     left  . OVARIAN CYST SURGERY     x2  . POLYPECTOMY  02/25/2017   Procedure: POLYPECTOMY;  Surgeon: Rogene Houston, MD;  [facility];  Service: Endoscopy;;  . PORTACATH PLACEMENT Left 02/16/2019   Procedure: INSERTION PORT-A-CATH (CATHETER  LEFT SUBCLAVIAN);  Surgeon: Aviva Signs, MD;  Location: [facility];  Service: General;  Laterality: Left;  . SALPINGOOPHORECTOMY Bilateral 12/09/2014   Procedure: SALPINGO  OOPHORECTOMY;  Surgeon: Servando Salina, MD;  Location: [facility];  Service: Gynecology;  Laterality: Bilateral;  . TUBAL LIGATION    . VAGINAL HYSTERECTOMY N/A 12/09/2014   Procedure: HYSTERECTOMY VAGINAL;  Surgeon: Servando Salina, MD;  Location: [facility];  Service: Gynecology;  Laterality: N/A;  . VIDEO ASSISTED THORACOSCOPY (VATS)/ LOBECTOMY Right 12/30/2018   Procedure: VIDEO ASSISTED THORACOSCOPY (VATS)/RIGHT LOWER LOBE WEDGE RESECTION, RIGHT UPPER LOBECTOMY;  Surgeon: Lajuana Matte, MD;  Location: [facility];  Service: Thoracic;  Laterality: Right;  Marland Kitchen VIDEO BRONCHOSCOPY N/A 12/30/2018   Procedure: VIDEO BRONCHOSCOPY;  Surgeon: Lajuana Matte, MD;  [facility];  Service: Thoracic;  Laterality: N/A;     SOCIAL HISTORY:  Social History   Socioeconomic History  . Marital status: Divorced    Spouse name: Not on file  . Number of children: 2  . Years of education: Not on file  . Highest education level: Not on file  Occupational History  . Not on file  Social Needs  . Financial resource strain: Not on file  . Food insecurity    Worry: Not on file    Inability: Not on file  . Transportation needs    Medical: Not on file    Non-medical: Not on file  Tobacco Use  . Smoking status: Former Smoker    Packs/day: 0.50    Years: 50.00    Pack years: 25.00    Types: Cigarettes    Quit date: 11/25/2018    Years since quitting: 0.3  . Smokeless tobacco: Never Used  Substance and Sexual Activity  . Alcohol use: Yes    Comment: occasional  . Drug use: No  . Sexual activity: Yes    Birth control/protection: Post-menopausal  Lifestyle  . Physical activity    Days per week: Not on file    Minutes per session: Not on file  . Stress: Not on file  Relationships  . Social Herbalist on phone: Not on file    Gets together: Not on file    Attends religious service: Not on file    Active member of club or organization: Not on file    Attends meetings of clubs or  organizations: Not on file    Relationship status: Not on file  . Intimate partner violence    Fear of current or ex partner: Not on file    Emotionally abused: Not on file    Physically abused: Not on file    Forced sexual activity: Not on file  Other Topics Concern  . Not on file  Social History Narrative  . Not on file    FAMILY HISTORY:  Family History  Problem Relation Age of Onset  . Hypertension Mother   .  Heart failure Mother   . Congestive Heart Failure Mother   . COPD Mother   . Pernicious anemia Mother   . Cancer Mother        lung  . Hypertension Father   . CAD Father   . Heart attack Father   . Hypertension Sister   . Cancer Other   . Celiac disease Other     CURRENT MEDICATIONS:  Outpatient Encounter Medications as of 03/18/2019  Medication Sig  . aspirin EC 81 MG tablet Take 81 mg by mouth daily.  . Calcium Carb-Cholecalciferol (CALCIUM 600+D) 600-800 MG-UNIT TABS Take 1 tablet by mouth 2 (two) times daily.  Marland Kitchen CARBOPLATIN IV Inject into the vein every 21 ( twenty-one) days.   . Cholecalciferol (VITAMIN D-3) 1000 units CAPS Take 1,000 Units daily by mouth.   . clonazePAM (KLONOPIN) 0.5 MG tablet Take 1 tablet (0.5 mg total) by mouth at bedtime. If take this at bedtime, do NOT take an Oxycodone at night  . cyanocobalamin (,VITAMIN B-12,) 1000 MCG/ML injection INJECT 1 ML INTO THE MUSCLE ONCE MONTHLY AS DIRECTED. (Patient taking differently: Inject 1,000 mcg into the muscle every 30 (thirty) days. )  . ETOPOSIDE IV Inject into the vein every 21 ( twenty-one) days.  Marland Kitchen gabapentin (NEURONTIN) 300 MG capsule Take 300 mg by mouth at bedtime.  Marland Kitchen guaiFENesin (MUCINEX) 600 MG 12 hr tablet Take 1 tablet (600 mg total) by mouth 2 (two) times daily as needed for cough or to loosen phlegm.  . ibandronate (BONIVA) 150 MG tablet Take 150 mg by mouth every 30 (thirty) days.   . pantoprazole (PROTONIX) 40 MG tablet Take 40 mg daily by mouth.  . rosuvastatin (CRESTOR) 40 MG  tablet Take 40 mg at bedtime by mouth.   Marland Kitchen albuterol (PROVENTIL HFA;VENTOLIN HFA) 108 (90 Base) MCG/ACT inhaler Inhale 2 puffs into the lungs every 6 (six) hours as needed for wheezing or shortness of breath. (Patient not taking: Reported on 03/18/2019)  . ibuprofen (ADVIL,MOTRIN) 800 MG tablet Take 800 mg every 8 (eight) hours as needed by mouth for moderate pain.  Marland Kitchen ipratropium-albuterol (DUONEB) 0.5-2.5 (3) MG/3ML SOLN Take 3 mLs by nebulization every 6 (six) hours as needed (shortness of breath). (Patient not taking: Reported on 03/18/2019)  . lidocaine-prilocaine (EMLA) cream Apply a small amount to port a cath site and cover with plastic wrap 1 hour prior to chemotherapy appointments (Patient not taking: Reported on 03/18/2019)  . methocarbamol (ROBAXIN) 500 MG tablet Take 500 mg by mouth every 6 (six) hours as needed for muscle spasms.   . ondansetron (ZOFRAN) 8 MG tablet Take 1 tablet (8 mg total) by mouth every 8 (eight) hours as needed for nausea or vomiting. (Patient not taking: Reported on 03/18/2019)  . OXYGEN Inhale 3 L into the lungs daily.  . polyethylene glycol (MIRALAX / GLYCOLAX) 17 g packet Take 17 g by mouth daily as needed for mild constipation.  . prochlorperazine (COMPAZINE) 10 MG tablet Take 1 tablet (10 mg total) by mouth every 6 (six) hours as needed (Nausea or vomiting). (Patient not taking: Reported on 03/18/2019)  . [DISCONTINUED] hydroxyurea (HYDREA) 500 MG capsule TAKE 1 CAPSULE DAILY. Please contact medical doctor who prescribed this as creatinine elevated at 1.52 on 01/07/2019. (Patient not taking: Reported on 03/16/2019)   No facility-administered encounter medications on file as of 03/18/2019.     ALLERGIES:  Allergies  Allergen Reactions  . Penicillins Hives and Rash    Did it involve swelling of  the face/tongue/throat, SOB, or low BP? No Did it involve sudden or severe rash/hives, skin peeling, or any reaction on the inside of your mouth or nose? No Did you need  to seek medical attention at a hospital or doctor's office? No When did it last happen?5-10 year If all above answers are "NO", may proceed with cephalosporin use.    . Tape Rash    Medipore, Coban, and paper tape CAN be tolerated     PHYSICAL EXAM:  ECOG Performance status: 1  Vitals:   03/18/19 1104  BP: 90/62  Pulse: 79  Resp: 18  Temp: (!) 97.5 F (36.4 C)  SpO2: 100%   Filed Weights   03/18/19 1102 03/18/19 1104  Weight: 107 lb 3.2 oz (48.6 kg) 107 lb 3.2 oz (48.6 kg)    Physical Exam Constitutional:      Appearance: Normal appearance. She is normal weight.  Cardiovascular:     Rate and Rhythm: Normal rate and regular rhythm.     Heart sounds: Normal heart sounds.  Pulmonary:     Effort: Pulmonary effort is normal.     Breath sounds: Normal breath sounds.  Abdominal:     General: Abdomen is flat. There is no distension.     Palpations: Abdomen is soft. There is no mass.  Musculoskeletal: Normal range of motion.  Skin:    General: Skin is warm and dry.  Neurological:     Mental Status: She is alert and oriented to person, place, and time. Mental status is at baseline.  Psychiatric:        Mood and Affect: Mood normal.        Behavior: Behavior normal.        Thought Content: Thought content normal.        Judgment: Judgment normal.      LABORATORY DATA:  I have reviewed the labs as listed.  CBC    Component Value Date/Time   WBC 5.9 03/09/2019 0758   RBC 2.14 (L) 03/09/2019 0758   HGB 7.7 (L) 03/09/2019 0758   HCT 24.9 (L) 03/09/2019 0758   PLT 353 03/09/2019 0758   MCV 116.4 (H) 03/09/2019 0758   MCH 36.0 (H) 03/09/2019 0758   MCHC 30.9 03/09/2019 0758   RDW 17.6 (H) 03/09/2019 0758   LYMPHSABS 0.7 03/09/2019 0758   MONOABS 0.6 03/09/2019 0758   EOSABS 0.2 03/09/2019 0758   BASOSABS 0.0 03/09/2019 0758   CMP Latest Ref Rng & Units 03/18/2019 03/09/2019 03/02/2019  Glucose 70 - 99 mg/dL 122(H) 93 117(H)  BUN 8 - 23 mg/dL _0 Creatinine 0.44 - 1.00 mg/dL 0.95 0.97 1.22(H)  Sodium 135 - 145 mmol/L 136 137 137  Potassium 3.5 - 5.1 mmol/L 3.3(L) 3.1(L) 3.4(L)  Chloride 98 - 111 mmol/L 103 103 103  CO2 22 - 32 mmol/L _1 Calcium 8.9 - 10.3 mg/dL 9.0 8.4(L) 8.6(L)  Total Protein 6.5 - 8.1 g/dL 6.2(L) 6.9 7.1  Total Bilirubin 0.3 - 1.2 mg/dL 0.5 0.7 0.6  Alkaline Phos 38 - 126 U/L 62 71 77  AST 15 - 41 U/L 13(L) 15 18  ALT 0 - 44 U/L _2 DIAGNOSTIC IMAGING:  I have independently reviewed the scans and discussed with the patient.     ASSESSMENT & PLAN:   Small cell lung carcinoma, right ([facility]) 1.  Stage III (T3N1) small cell lung cancer: -Right upper lobectomy on 12/30/2018 shows invasive  small cell carcinoma, 4.2 cm, involving visceral pleura, 1 out of 3 lymph nodes positive, lymphovascular invasion positive, metastatic carcinoma in 2, 11 R lymph nodes.  Right chest wall biopsy consistent with small cell carcinoma. -30 Gy of radiation in 10 fractions from 01/28/2019 through 02/10/2019. -PET scan on 01/26/2019 shows dense consolidative airspace disease in the posterior right base, markedly hypermetabolic SUV 8.  Loculated anterior pleural fluid in the right hemithorax with adjacent airspace consolidation is hypermetabolic with SUV 4.1.  Hypermetabolic uptake identified along the right lung staple lines.  2.5 cm focus of soft tissue in the right axilla with SUV 2.2.  Precarinal lymph node, short axis IX millimeters with SUV 2.5.  12 mm short axis subcarinal lymph node with SUV 3. -CT scan dated on 02/22/2019 showed stable findings.  Borderline mediastinal lymph nodes present. -Cycle 1 of chemotherapy with carboplatin VP-16 on 03/09/2019.  2.  Macrocytic anemia: -Etiology CKD and relative iron deficiency. -She received first infusion of Feraheme on 03/09/2019 and tolerated well.  3.  Weight loss: -Weight today has improved to 107.2.  Her usual weight is around 112. -We will plan to start Megace  if her weight drops below 105.  4.  Essential thrombocytosis: -She was on Hydrea of 100 mg Tuesday through Friday and 1000 mg on Saturday, Sunday and Monday. -We held Hydrea since 03/02/2019. -She will proceed with her second cycle today.  Total time spent is 25 minutes with more than 50% of time spent face-to-face discussing  treatment plan changes, counseling and coordination of care.  Orders placed this encounter:  No orders of the defined types were placed in this encounter.     Derek Jack, MD [place] [phone]

## 2019-03-18 NOTE — [facility] Notes (Signed)
Pt presents today for f/u appointment with Dr. Delton Coombes and Feraheme infusion. Labs collected. HGB 6.9 / WBC 1.1. Verbal orders received to administer 1 Unit of PRBC's today and Feraheme per [facility] LPN/ Dr. Delton Coombes.   Treatment given today per MD orders. Tolerated infusion without adverse affects. Vital signs stable. No complaints at this time. Discharged from clinic via wheel chair.  F/U with [facility] as scheduled.

## 2019-03-19 LAB — BPAM RBC
Blood Product Expiration Date: 202012212359
ISSUE DATE / TIME: 202012021328
Unit Type and Rh: 600

## 2019-03-19 LAB — TYPE AND SCREEN
ABO/RH(D): AB POS
Antibody Screen: NEGATIVE
Unit division: 0

## 2019-03-30 ENCOUNTER — Inpatient Hospital Stay (HOSPITAL_COMMUNITY): Payer: Medicare Other

## 2019-03-30 ENCOUNTER — Other Ambulatory Visit: Payer: Self-pay

## 2019-03-30 ENCOUNTER — Inpatient Hospital Stay (HOSPITAL_BASED_OUTPATIENT_CLINIC_OR_DEPARTMENT_OTHER): Payer: Medicare Other | Admitting: Hematology

## 2019-03-30 VITALS — BP 132/65 | HR 80 | Temp 96.9°F | Resp 18

## 2019-03-30 DIAGNOSIS — C3491 Malignant neoplasm of unspecified part of right bronchus or lung: Secondary | ICD-10-CM | POA: Diagnosis not present

## 2019-03-30 DIAGNOSIS — Z5111 Encounter for antineoplastic chemotherapy: Secondary | ICD-10-CM | POA: Diagnosis not present

## 2019-03-30 LAB — CBC WITH DIFFERENTIAL/PLATELET
Abs Immature Granulocytes: 0.12 10*3/uL — ABNORMAL HIGH (ref 0.00–0.07)
Basophils Absolute: 0.1 10*3/uL (ref 0.0–0.1)
Basophils Relative: 1 %
Eosinophils Absolute: 0 10*3/uL (ref 0.0–0.5)
Eosinophils Relative: 0 %
HCT: 26.5 % — ABNORMAL LOW (ref 36.0–46.0)
Hemoglobin: 8.2 g/dL — ABNORMAL LOW (ref 12.0–15.0)
Immature Granulocytes: 1 %
Lymphocytes Relative: 9 %
Lymphs Abs: 1.2 10*3/uL (ref 0.7–4.0)
MCH: 32.9 pg (ref 26.0–34.0)
MCHC: 30.9 g/dL (ref 30.0–36.0)
MCV: 106.4 fL — ABNORMAL HIGH (ref 80.0–100.0)
Monocytes Absolute: 1.3 10*3/uL — ABNORMAL HIGH (ref 0.1–1.0)
Monocytes Relative: 10 %
Neutro Abs: 10 10*3/uL — ABNORMAL HIGH (ref 1.7–7.7)
Neutrophils Relative %: 79 %
Platelets: 764 10*3/uL — ABNORMAL HIGH (ref 150–400)
RBC: 2.49 MIL/uL — ABNORMAL LOW (ref 3.87–5.11)
RDW: 22.8 % — ABNORMAL HIGH (ref 11.5–15.5)
WBC: 12.6 10*3/uL — ABNORMAL HIGH (ref 4.0–10.5)
nRBC: 0 % (ref 0.0–0.2)

## 2019-03-30 LAB — COMPREHENSIVE METABOLIC PANEL
ALT: 9 U/L (ref 0–44)
AST: 15 U/L (ref 15–41)
Albumin: 3.2 g/dL — ABNORMAL LOW (ref 3.5–5.0)
Alkaline Phosphatase: 75 U/L (ref 38–126)
Anion gap: 8 (ref 5–15)
BUN: 18 mg/dL (ref 8–23)
CO2: 25 mmol/L (ref 22–32)
Calcium: 9.3 mg/dL (ref 8.9–10.3)
Chloride: 107 mmol/L (ref 98–111)
Creatinine, Ser: 0.93 mg/dL (ref 0.44–1.00)
GFR calc Af Amer: >60 mL/min (ref >60)
GFR calc non Af Amer: >60 mL/min (ref >60)
Glucose, Bld: 98 mg/dL (ref 70–99)
Potassium: 3.5 mmol/L (ref 3.5–5.1)
Sodium: 140 mmol/L (ref 135–145)
Total Bilirubin: 0.4 mg/dL (ref 0.3–1.2)
Total Protein: 6.6 g/dL (ref 6.5–8.1)

## 2019-03-30 MED ORDER — SODIUM CHLORIDE 0.9% FLUSH
10.0000 mL | INTRAVENOUS | Status: DC | PRN
  Administered 2019-03-30: 10:00:00 10 mL

## 2019-03-30 MED ORDER — PALONOSETRON HCL INJECTION 0.25 MG/5ML
0.2500 mg | Freq: Once | INTRAVENOUS | Status: AC
  Administered 2019-03-30: 0.25 mg via INTRAVENOUS

## 2019-03-30 MED ORDER — SODIUM CHLORIDE 0.9 % IV SOLN
325.5000 mg | Freq: Once | INTRAVENOUS | Status: AC
  Administered 2019-03-30: 330 mg via INTRAVENOUS
  Filled 2019-03-30: qty 33

## 2019-03-30 MED ORDER — SODIUM CHLORIDE 0.9 % IV SOLN
Freq: Once | INTRAVENOUS | Status: AC
  Administered 2019-03-30: 10:00:00 via INTRAVENOUS

## 2019-03-30 MED ORDER — HEPARIN SOD (PORK) LOCK FLUSH 100 UNIT/ML IV SOLN
500.0000 [IU] | Freq: Once | INTRAVENOUS | Status: AC | PRN
  Administered 2019-03-30: 500 [IU]

## 2019-03-30 MED ORDER — SODIUM CHLORIDE 0.9 % IV SOLN
Freq: Once | INTRAVENOUS | Status: AC
  Administered 2019-03-30: 10:00:00 via INTRAVENOUS
  Filled 2019-03-30: qty 5

## 2019-03-30 MED ORDER — PALONOSETRON HCL INJECTION 0.25 MG/5ML
INTRAVENOUS | Status: AC
  Filled 2019-03-30: qty 5

## 2019-03-30 MED ORDER — SODIUM CHLORIDE 0.9 % IV SOLN
100.0000 mg/m2 | Freq: Once | INTRAVENOUS | Status: AC
  Administered 2019-03-30: 150 mg via INTRAVENOUS
  Filled 2019-03-30: qty 7.5

## 2019-03-30 NOTE — Patient Instructions (Signed)
[facility] Discharge Instructions for Patients Receiving Chemotherapy  Today you received the following chemotherapy agents   To help prevent nausea and vomiting after your treatment, we encourage you to take your nausea medication   If you develop nausea and vomiting that is not controlled by your nausea medication, call the clinic.   BELOW ARE SYMPTOMS THAT SHOULD BE REPORTED IMMEDIATELY:  *FEVER GREATER THAN 100.5 F  *CHILLS WITH OR WITHOUT FEVER  NAUSEA AND VOMITING THAT IS NOT CONTROLLED WITH YOUR NAUSEA MEDICATION  *UNUSUAL SHORTNESS OF BREATH  *UNUSUAL BRUISING OR BLEEDING  TENDERNESS IN MOUTH AND THROAT WITH OR WITHOUT PRESENCE OF ULCERS  *URINARY PROBLEMS  *BOWEL PROBLEMS  UNUSUAL RASH Items with * indicate a potential emergency and should be followed up as soon as possible.  Feel free to call the clinic should you have any questions or concerns. The clinic phone number is [phone].  Please show the CHEMO ALERT CARD at check-in to the Emergency Department and triage nurse.   

## 2019-03-30 NOTE — Patient Instructions (Addendum)
[place] at [facility] Discharge Instructions  You were seen today by Dr. Delton Coombes. He went over your recent lab results. You will get treatment today. Try taking 2 tablets of gabapentin at night for the neuropathy. He will change your injection to a different medication, you will have to come back to the clinic a day or to after your treatment to get the injection. He will see you back in 3 weeks for labs, treatment and follow up.   Thank you for choosing [place] at [facility] to provide your oncology and hematology care.  To afford each patient quality time with our provider, please arrive at least 15 minutes before your scheduled appointment time.   If you have a lab appointment with the [facility] please come in thru the  Main Entrance and check in at the main information desk  You need to re-schedule your appointment should you arrive 10 or more minutes late.  We strive to give you quality time with our providers, and arriving late affects you and other patients whose appointments are after yours.  Also, if you no show three or more times for appointments you may be dismissed from the clinic at the providers discretion.     Again, thank you for choosing [facility].  Our hope is that these requests will decrease the amount of time that you wait before being seen by our physicians.       _____________________________________________________________  Should you have questions after your visit to [facility], please contact our office at [phone] between the hours of 8:00 a.m. and 4:30 p.m.  Voicemails left after 4:00 p.m. will not be returned until the following business day.  For prescription refill requests, have your pharmacy contact our office and allow 72 hours.    Cancer Center Support Programs:   > Cancer Support Group  2nd Tuesday of the month 1pm-2pm, Journey Room

## 2019-03-30 NOTE — Progress Notes (Signed)
Ashley [name], [place] [address]   CLINIC:  Medical Oncology/Hematology  PCP:  [name], NP [address] [place] [address] [phone]   REASON FOR VISIT: Small cell lung cancer, essential thrombocytosis.  CURRENT THERAPY:Carboplatin and VP-16.    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 The 3 liver lesions are not hypermetabolic. The radiologist suspects they may be subtly present on prior CT chest from 10/2013, to further reassuring that these are likely benign lesions.   Melanoma ([place])  07/09/2013 Initial Biopsy   Initial biopsy L upper thigh/buttocks melanoma   07/20/2013 Surgery   Excision L lateral buttock/upper thigh melanoma, clear margins   07/20/2013 Pathology Results   Breslow depth 1.02 mm, pT2a    Small cell lung carcinoma, right ([place])  11/27/2018 Initial Diagnosis   Small cell lung carcinoma, right ([place])   03/09/2019 -  Chemotherapy   The patient had palonosetron (ALOXI) injection 0.25 mg, 0.25 mg, Intravenous,  Once, 2 of 4 cycles Administration: 0.25 mg (03/09/2019), 0.25 mg (03/30/2019) pegfilgrastim (NEULASTA ONPRO KIT) injection 6 mg, 6 mg, Subcutaneous, Once, 1 of 1 cycle Administration: 6 mg (03/11/2019) pegfilgrastim-jmdb (FULPHILA) injection 6 mg, 6 mg, Subcutaneous,  Once, 1 of 3 cycles CARBOplatin (PARAPLATIN) 330 mg in sodium chloride 0.9 % 250 mL chemo infusion, 330 mg (100 % of original dose 325.5 mg),  Intravenous,  Once, 2 of 4 cycles Dose modification:   (original dose 325.5 mg, Cycle 1),   (original dose 325.5 mg, Cycle 2) Administration: 330 mg (03/09/2019), 330 mg (03/30/2019) etoposide (VEPESID) 150 mg in sodium chloride 0.9 % 500 mL chemo infusion, 100 mg/m2 = 150 mg, Intravenous,  Once, 2 of 4 cycles Administration: 150 mg (03/09/2019), 150 mg (03/10/2019), 150 mg (03/11/2019), 150 mg (03/30/2019), 150 mg (03/31/2019), 150 mg (04/01/2019) fosaprepitant (EMEND) 150 mg, dexamethasone (DECADRON) 12 mg in sodium chloride 0.9 % 145 mL IVPB, , Intravenous,  Once, 2 of 4 cycles Administration:  (03/09/2019),  (03/30/2019)  for chemotherapy treatment.    03/09/2019 Cancer Staging   Staging form: Lung, AJCC 8th Edition - Clinical: Stage IIIA (cT3, cN1, cM0) - Signed by Derek Jack, MD on 03/09/2019      INTERVAL HISTORY:  Ashley [name] 68 y.o. female seen for follow-up of small cell lung cancer, toxicity assessment prior to cycle 2.  After cycle 1 she is experienced severe body pains from Neulasta on pro which lasted about 4 to 5 days.  She also had some nausea but denied any vomiting.  She reported worsening of burning pain in the legs at nighttime.  She is taking gabapentin 300 mg nightly.  She received 1 unit PRBC on 03/18/2019.  Appetite and energy levels reported at 75%.  Denies any fevers or night sweats.  REVIEW OF SYSTEMS:  Review of Systems  Gastrointestinal: Positive for nausea.  Neurological: Positive for numbness.  All other systems reviewed and are negative.    PAST  MEDICAL/SURGICAL HISTORY:  Past Medical History:  Diagnosis Date  . 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 Anginal pain ([place])   . Arthritis   . Ascending aortic aneurysm ([place])   . Cancer [facility]) 1995   breast, left, mastectomy/chemo  . Chest pain    Possibly cardiac. No evidence of ischemia/injury based upon normal troponin I. Chest discomfort could be tachycardia induced supply demand  mismatch.   . CHF (congestive heart failure) ([place]) 11/17/2015   after surgery   . Colon adenomas   . Coronary artery disease   . DJD (degenerative joint disease)   . Dyspnea    with exertion  . Emphysema of lung ([place]) 11/17/2015  . Essential hypertension, benign   . GERD (gastroesophageal reflux disease)   . History of hiatal hernia   . Hyperlipidemia   . Hypertension   . Melanoma ([place]) 01/09/2016  . Osteopenia   . Palpitations   . Pernicious anemia 03/06/2016  . Pernicious anemia   . Pure hypercholesterolemia   . Raynaud's disease   . Thrombocythemia, essential ([place]) 01/09/2016  . Thrombocytosis (HCC)    Idiopathic  . Vitamin D deficiency    Past Surgical History:  Procedure Laterality Date  . ABDOMINAL HYSTERECTOMY    . ANTERIOR AND POSTERIOR REPAIR N/A 12/09/2014   Procedure: ANTERIOR (CYSTOCELE) AND POSTERIOR REPAIR (RECTOCELE);  Surgeon: Bjorn Loser, MD;  Location: [facility];  Service: Urology;  Laterality: N/A;  . ANTERIOR CERVICAL DECOMPRESSION/DISCECTOMY FUSION 4 LEVELS Right 10/03/2016   Procedure: ANTERIOR CERVICAL DECOMPRESSION FUSION, CERVICAL 4-5, CERVICAL 5-6, CERVICAL 6-7, CERVICAL 7 TO THORACIC 1 WITH INSTRUMENTATION AND ALLOGRAFT;  Surgeon: Phylliss Bob, MD;  Location: [facility];  Service: Orthopedics;  Laterality: Right;  ANTERIOR CERVICAL DECOMPRESSION FUSION, CERVICAL 4-5, CERVICAL 5-6, CERVICAL 6-7, CERVICAL 7 TO THORACIC 1 WITH INSTRUMENTATION AND ALLOGRAFT; REQUEST 4 HO  . ANTERIOR LAT LUMBAR FUSION Left 11/16/2015   Procedure: LEFT SIDED LATERAL INTERBODY FUSION, LUMBAR 2-3, LUMBAR 3-4, LUMBAR 4-5 WITH INSTRUMENTATION;  Surgeon: Phylliss Bob, MD;  Location: [facility];  Service: Orthopedics;  Laterality: Left;  LEFT SIDED LATEARL INTERBODY FUSION, LUMBAR 2-3, LUMBAR 3-4, LUMBAR 4-5 WITH INSTRUMENTATION   . APPENDECTOMY    . BACK SURGERY    . BONE MARROW ASPIRATION  07/2012  . BONE MARROW BIOPSY  07/2012  . BREAST SURGERY    . CARDIAC CATHETERIZATION    . CARDIAC  CATHETERIZATION N/A 01/20/2016   Procedure: Left Heart Cath and Coronary Angiography;  Surgeon: Burnell Blanks, MD;  Location: [facility];  Service: Cardiovascular;  Laterality: N/A;  . COLONOSCOPY  11/29/2010   Procedure: COLONOSCOPY;  Surgeon: Rogene Houston, MD;  [facility];  Service: Endoscopy;  Laterality: N/A;  . COLONOSCOPY N/A 02/18/2014   Procedure: COLONOSCOPY;  Surgeon: Rogene Houston, MD;  [facility];  Service: Endoscopy;  Laterality: N/A;  1030  . COLONOSCOPY N/A 02/25/2017   Procedure: COLONOSCOPY;  Surgeon: Rogene Houston, MD;  [facility];  Service: Endoscopy;  Laterality: N/A;  10:55  . CYSTOSCOPY N/A 12/09/2014   Procedure: CYSTOSCOPY;  Surgeon: Bjorn Loser, MD;  Location: [facility];  Service: Urology;  Laterality: N/A;  . ESOPHAGEAL DILATION N/A 02/25/2017   Procedure: ESOPHAGEAL DILATION;  Surgeon: Rogene Houston, MD;  [facility];  Service: Endoscopy;  Laterality: N/A;  . ESOPHAGOGASTRODUODENOSCOPY N/A 02/25/2017   Procedure: ESOPHAGOGASTRODUODENOSCOPY (EGD);  Surgeon: Rogene Houston, MD;  [facility];  Service: Endoscopy;  Laterality: N/A;  . IR GENERIC HISTORICAL  01/11/2016   IR RADIOLOGY PERIPHERAL GUIDED IV START 01/11/2016 Saverio Danker, PA-C [facility]  . IR GENERIC HISTORICAL  01/11/2016   IR US GUIDE VASC ACCESS RIGHT 01/11/2016 Saverio Danker, PA-C [facility]  . LYMPH NODE DISSECTION Right 12/30/2018   Procedure: Lymph Node Dissection;  Surgeon: Lajuana Matte, MD;  Location: [place];  Service: Thoracic;  Laterality: Right;  . MASTECTOMY     left  . OVARIAN CYST SURGERY     x2  . POLYPECTOMY  02/25/2017   Procedure: POLYPECTOMY;  Surgeon: Rogene Houston, MD;  [facility];  Service: Endoscopy;;  . PORTACATH PLACEMENT Left 02/16/2019   Procedure: INSERTION PORT-A-CATH (CATHETER  LEFT SUBCLAVIAN);  Surgeon: Aviva Signs, MD;  Location: [facility];  Service: General;   Laterality: Left;  . SALPINGOOPHORECTOMY Bilateral 12/09/2014   Procedure: SALPINGO OOPHORECTOMY;  Surgeon: Servando Salina, MD;  Location: [facility];  Service: Gynecology;  Laterality: Bilateral;  . TUBAL LIGATION    . VAGINAL HYSTERECTOMY N/A 12/09/2014   Procedure: HYSTERECTOMY VAGINAL;  Surgeon: Servando Salina, MD;  Location: [facility];  Service: Gynecology;  Laterality: N/A;  . VIDEO ASSISTED THORACOSCOPY (VATS)/ LOBECTOMY Right 12/30/2018   Procedure: VIDEO ASSISTED THORACOSCOPY (VATS)/RIGHT LOWER LOBE WEDGE RESECTION, RIGHT UPPER LOBECTOMY;  Surgeon: Lajuana Matte, MD;  Location: [facility];  Service: Thoracic;  Laterality: Right;  Marland Kitchen VIDEO BRONCHOSCOPY N/A 12/30/2018   Procedure: VIDEO BRONCHOSCOPY;  Surgeon: Lajuana Matte, MD;  Location: [facility];  Service: Thoracic;  Laterality: N/A;     SOCIAL HISTORY:  Social History   Socioeconomic History  . Marital status: Divorced    Spouse name: Not on file  . Number of children: 2  . Years of education: Not on file  . Highest education level: Not on file  Occupational History  . Not on file  Tobacco Use  . Smoking status: Former Smoker    Packs/day: 0.50    Years: 50.00    Pack years: 25.00    Types: Cigarettes    Quit date: 11/25/2018    Years since quitting: 0.3  . Smokeless tobacco: Never Used  Substance and Sexual Activity  . Alcohol use: Yes    Comment: occasional  . Drug use: No  . Sexual activity: Yes    Birth control/protection: Post-menopausal  Other Topics Concern  . Not on file  Social History Narrative  . Not on file   Social Determinants of Health   Financial Resource Strain:   . Difficulty of Paying Living Expenses: Not on file  Food Insecurity:   . Worried About Charity fundraiser in the Last Year: Not on file  . Ran Out of Food in the Last Year: Not on file  Transportation Needs:   . Lack of Transportation (Medical): Not on file  . Lack of Transportation (Non-Medical): Not on file  Physical  Activity:   . Days of Exercise per Week: Not on file  . Minutes of Exercise per Session: Not on file  Stress:   . Feeling of Stress : Not on file  Social Connections:   . Frequency of Communication with Friends and Family: Not on file  . Frequency of Social Gatherings with Friends and Family: Not on file  . Attends Religious Services: Not on file  . Active Member of Clubs or Organizations: Not on file  . Attends Archivist Meetings: Not on file  . Marital Status: Not on file  Intimate Partner Violence:   . Fear of Current or Ex-Partner: Not on file  . Emotionally Abused: Not on file  . Physically Abused: Not on file  . Sexually Abused: Not on file    FAMILY HISTORY:  Family History  Problem Relation Age of Onset  . Hypertension Mother   . Heart failure Mother   . Congestive Heart Failure Mother   . COPD Mother   . Pernicious anemia Mother   . Cancer Mother        lung  . Hypertension Father   . CAD Father   . Heart attack Father   . Hypertension Sister   . Cancer Other   . Celiac disease Other     CURRENT MEDICATIONS:  Outpatient Encounter Medications as of 03/30/2019  Medication Sig  . albuterol (PROVENTIL HFA;VENTOLIN HFA) 108 (90 Base) MCG/ACT inhaler Inhale 2 puffs into the lungs every 6 (six) hours as needed for wheezing or shortness of breath.  Marland Kitchen aspirin EC 81 MG tablet Take 81 mg by mouth daily.  . Calcium Carb-Cholecalciferol (CALCIUM 600+D) 600-800 MG-UNIT TABS Take 1 tablet by mouth 2 (two) times daily.  Marland Kitchen CARBOPLATIN IV Inject into the vein every 21 ( twenty-one) days.   . Cholecalciferol (VITAMIN D-3) 1000 units CAPS Take 1,000 Units daily by mouth.   . clonazePAM (KLONOPIN) 0.5 MG tablet Take 1 tablet (0.5 mg total) by mouth at bedtime. If take this at bedtime, do NOT take an Oxycodone at night  . cyanocobalamin (,VITAMIN B-12,) 1000 MCG/ML injection INJECT 1 ML INTO THE MUSCLE ONCE MONTHLY AS DIRECTED. (Patient taking differently: Inject 1,000 mcg  into the muscle every 30 (thirty) days. )  . diltiazem (TIAZAC) 240 MG 24 hr capsule Take by mouth.  . ETOPOSIDE IV Inject into the vein every 21 ( twenty-one) days.  Marland Kitchen gabapentin (NEURONTIN) 300 MG capsule Take 300 mg by mouth at bedtime.  Marland Kitchen guaiFENesin (MUCINEX) 600 MG 12 hr tablet Take 1 tablet (600 mg total) by mouth 2 (two) times daily as needed for cough or to loosen phlegm.  . ibandronate (BONIVA) 150 MG tablet Take 150 mg by mouth every 30 (thirty) days.   Marland Kitchen ibuprofen (ADVIL,MOTRIN) 800 MG tablet Take 800 mg every 8 (eight) hours as needed by mouth for moderate pain.  Marland Kitchen ipratropium-albuterol (DUONEB) 0.5-2.5 (3) MG/3ML SOLN Take 3 mLs by nebulization every 6 (six) hours as needed (shortness of breath).  . lidocaine-prilocaine (EMLA) cream Apply a small amount to port a cath site and cover with plastic wrap 1 hour prior to chemotherapy appointments  . methocarbamol (ROBAXIN) 500 MG tablet Take 500 mg by mouth every 6 (six) hours as needed for muscle spasms.   . ondansetron (ZOFRAN) 8 MG tablet Take 1 tablet (8 mg total) by mouth every 8 (eight) hours as needed for nausea or vomiting.  . OXYGEN Inhale 3 L into the lungs daily.  . pantoprazole (PROTONIX) 40 MG tablet Take 40 mg daily by mouth.  . polyethylene glycol (MIRALAX / GLYCOLAX) 17 g packet Take 17 g by mouth daily as needed for mild constipation.  . prochlorperazine (COMPAZINE) 10 MG tablet Take 1 tablet (10 mg total) by mouth every 6 (six) hours as needed (Nausea or vomiting).  . rosuvastatin (CRESTOR) 40 MG tablet Take 40 mg at bedtime by mouth.    No facility-administered encounter medications on file as of 03/30/2019.    ALLERGIES:  Allergies  Allergen Reactions  . Penicillins Hives and Rash  Did it involve swelling of the face/tongue/throat, SOB, or low BP? No Did it involve sudden or severe rash/hives, skin peeling, or any reaction on the inside of your mouth or nose? No Did you need to seek medical attention at a  hospital or doctor's office? No When did it last happen?5-10 year If all above answers are "NO", may proceed with cephalosporin use.    . Tape Rash    Medipore, Coban, and paper tape CAN be tolerated     PHYSICAL EXAM:  ECOG Performance status: 1  Vitals:   03/30/19 0841  BP: 123/68  Pulse: 74  Resp: 16  Temp: (!) 97 F (36.1 C)  SpO2: 99%   Filed Weights   03/30/19 0841  Weight: 109 lb (49.4 kg)    Physical Exam Constitutional:      Appearance: Normal appearance. She is normal weight.  Cardiovascular:     Rate and Rhythm: Normal rate and regular rhythm.     Heart sounds: Normal heart sounds.  Pulmonary:     Effort: Pulmonary effort is normal.     Breath sounds: Normal breath sounds.  Abdominal:     General: Abdomen is flat. There is no distension.     Palpations: Abdomen is soft. There is no mass.  Musculoskeletal:        General: Normal range of motion.  Skin:    General: Skin is warm and dry.  Neurological:     Mental Status: She is alert and oriented to person, place, and time. Mental status is at baseline.  Psychiatric:        Mood and Affect: Mood normal.        Behavior: Behavior normal.        Thought Content: Thought content normal.        Judgment: Judgment normal.      LABORATORY DATA:  I have reviewed the labs as listed.  CBC    Component Value Date/Time   WBC 12.6 (H) 03/30/2019 0830   RBC 2.49 (L) 03/30/2019 0830   HGB 8.2 (L) 03/30/2019 0830   HCT 26.5 (L) 03/30/2019 0830   PLT 764 (H) 03/30/2019 0830   MCV 106.4 (H) 03/30/2019 0830   MCH 32.9 03/30/2019 0830   MCHC 30.9 03/30/2019 0830   RDW 22.8 (H) 03/30/2019 0830   LYMPHSABS 1.2 03/30/2019 0830   MONOABS 1.3 (H) 03/30/2019 0830   EOSABS 0.0 03/30/2019 0830   BASOSABS 0.1 03/30/2019 0830   CMP Latest Ref Rng & Units 03/30/2019 03/18/2019 03/09/2019  Glucose 70 - 99 mg/dL 98 122(H) 93  BUN 8 - 23 mg/dL _0 Creatinine 0.44 - 1.00 mg/dL 0.93 0.95 0.97  Sodium 135 -  145 mmol/L 140 136 137  Potassium 3.5 - 5.1 mmol/L 3.5 3.3(L) 3.1(L)  Chloride 98 - 111 mmol/L 107 103 103  CO2 22 - 32 mmol/L _1 Calcium 8.9 - 10.3 mg/dL 9.3 9.0 8.4(L)  Total Protein 6.5 - 8.1 g/dL 6.6 6.2(L) 6.9  Total Bilirubin 0.3 - 1.2 mg/dL 0.4 0.5 0.7  Alkaline Phos 38 - 126 U/L 75 62 71  AST 15 - 41 U/L 15 13(L) 15  ALT 0 - 44 U/L _2 DIAGNOSTIC IMAGING:  I have independently reviewed the scans and discussed with the patient.     ASSESSMENT & PLAN:   Small cell lung carcinoma, right (HCC) 1.  Stage III (T3N1) small cell lung cancer: -Right upper  lobectomy on 12/30/2018 shows invasive small cell carcinoma, 4.2 cm, involving visceral pleura, 1 out of 3 lymph nodes positive, lymphovascular invasion positive, metastatic carcinoma in 2, 11 R lymph nodes.  Right chest wall biopsy consistent with small cell carcinoma. -30 Gy of radiation in 10 fractions from 01/28/2019 through 02/10/2019. -PET scan on 01/26/2019 shows dense consolidative airspace disease in the posterior right base, markedly hypermetabolic SUV 8.  Loculated anterior pleural fluid in the right hemithorax with adjacent airspace consolidation is hypermetabolic with SUV 4.1.  Hypermetabolic uptake identified along the right lung staple lines.  2.5 cm focus of soft tissue in the right axilla with SUV 2.2.  Precarinal lymph node, short axis IX millimeters with SUV 2.5.  12 mm short axis subcarinal lymph node with SUV 3. -CT scan dated on 02/22/2019 showed stable findings.  Borderline mediastinal lymph nodes present. -Cycle 1 of chemotherapy with carboplatin and VP-16 on 03/09/2019. -She has experienced severe body pains which lasted 4 to 5 days after Neulasta on pro.  She even took Claritin. -I have recommended change in Neulasta on pro to fulfilla. -She received 1 unit PRBC on 03/18/2019.  We reviewed labs.  Hemoglobin today is 8.2. -We will proceed with cycle 2 today with no dose modifications. -She will  come back in 3 weeks for follow-up.  2.  Macrocytic anemia: -Etiology CKD and relative iron deficiency. -She received Feraheme on 03/09/2019 and 03/18/2019.  3.  Weight loss: -Weight today improved to 109.  Usual weight is around 112. -We will consider appetite stimulant if it drops below 105.  4.  Essential thrombocytosis: -She was on Hydrea of 100 mg Tuesday through Friday and 1000 mg on Saturday, Sunday and Monday. -We held Hydrea since 03/02/2019. -She will proceed with her second cycle today.  5.  Peripheral neuropathy: -She reported pain in the legs at nighttime, burning pain. -She is currently taking gabapentin 300 mg nightly.  We will increase it to 600 mg nightly.  If it improves, will give prescription for higher dose.   Orders placed this encounter:  No orders of the defined types were placed in this encounter.     Derek Jack, MD [place] [phone]

## 2019-03-30 NOTE — Progress Notes (Signed)
Labs reviewed by MD today. Proceed as planned. Will change Neulasta onpro to Fulphila per MD.   Treatment given per orders. Patient tolerated it well without problems. Vitals stable and discharged home from clinic ambulatory. Follow up as scheduled.

## 2019-03-31 ENCOUNTER — Encounter (HOSPITAL_COMMUNITY): Payer: Self-pay

## 2019-03-31 ENCOUNTER — Inpatient Hospital Stay (HOSPITAL_COMMUNITY): Payer: Medicare Other

## 2019-03-31 VITALS — BP 119/63 | HR 76 | Temp 97.3°F | Resp 18

## 2019-03-31 DIAGNOSIS — C3491 Malignant neoplasm of unspecified part of right bronchus or lung: Secondary | ICD-10-CM

## 2019-03-31 DIAGNOSIS — Z5111 Encounter for antineoplastic chemotherapy: Secondary | ICD-10-CM | POA: Diagnosis not present

## 2019-03-31 MED ORDER — HEPARIN SOD (PORK) LOCK FLUSH 100 UNIT/ML IV SOLN
500.0000 [IU] | Freq: Once | INTRAVENOUS | Status: AC | PRN
  Administered 2019-03-31: 500 [IU]

## 2019-03-31 MED ORDER — SODIUM CHLORIDE 0.9 % IV SOLN: Freq: Once | INTRAVENOUS | Status: AC

## 2019-03-31 MED ORDER — SODIUM CHLORIDE 0.9% FLUSH
10.0000 mL | INTRAVENOUS | Status: DC | PRN
  Administered 2019-03-31: 10 mL

## 2019-03-31 MED ORDER — SODIUM CHLORIDE 0.9 % IV SOLN
10.0000 mg | Freq: Once | INTRAVENOUS | Status: AC
  Administered 2019-03-31: 10 mg via INTRAVENOUS
  Filled 2019-03-31: qty 10

## 2019-03-31 MED ORDER — SODIUM CHLORIDE 0.9 % IV SOLN
100.0000 mg/m2 | Freq: Once | INTRAVENOUS | Status: AC
  Administered 2019-03-31: 150 mg via INTRAVENOUS
  Filled 2019-03-31: qty 7.5

## 2019-03-31 NOTE — Progress Notes (Signed)
Ashley [name] tolerated VP-16 infusion well without complaints or incident. Port left accessed and flushed for use tomorrow. VSS upon discharge. Pt discharged via wheelchair in satisfactory condition accompanied by her daughter

## 2019-03-31 NOTE — Patient Instructions (Signed)
[facility] Discharge Instructions for Patients Receiving Chemotherapy   Beginning January 23rd 2017 lab work for the [facility] will be done in the  Main lab at [facility] on 1st floor. If you have a lab appointment with the [facility] please come in thru the  Main Entrance and check in at the main information desk   Today you received the following chemotherapy agents VP-16. Follow-up as scheduled. Call clinic for any questions or concerns  To help prevent nausea and vomiting after your treatment, we encourage you to take your nausea medication   If you develop nausea and vomiting, or diarrhea that is not controlled by your medication, call the clinic.  The clinic phone number is [phone]. Office hours are Monday-Friday 8:30am-5:00pm.  BELOW ARE SYMPTOMS THAT SHOULD BE REPORTED IMMEDIATELY:  *FEVER GREATER THAN 101.0 F  *CHILLS WITH OR WITHOUT FEVER  NAUSEA AND VOMITING THAT IS NOT CONTROLLED WITH YOUR NAUSEA MEDICATION  *UNUSUAL SHORTNESS OF BREATH  *UNUSUAL BRUISING OR BLEEDING  TENDERNESS IN MOUTH AND THROAT WITH OR WITHOUT PRESENCE OF ULCERS  *URINARY PROBLEMS  *BOWEL PROBLEMS  UNUSUAL RASH Items with * indicate a potential emergency and should be followed up as soon as possible. If you have an emergency after office hours please contact your primary care physician or go to the nearest emergency department.  Please call the clinic during office hours if you have any questions or concerns.   You may also contact the Patient Navigator at [phone] should you have any questions or need assistance in obtaining follow up care.      Resources For Cancer Patients and their Caregivers ? American Cancer Society: Can assist with transportation, wigs, general needs, runs Look Good Feel Better.        574-079-3819 ? Cancer Care: Provides financial assistance, online support groups, medication/co-pay assistance.  1-800-813-HOPE  (774)746-2214) ? [facility] Assists [place] cancer patients and their families through emotional , educational and financial support.  (604)354-8975 ? [place] DSS Where to apply for food stamps, Medicaid and utility assistance. 319-721-7677 ? [facility]: Transportation to medical appointments. (305)633-2630 ? Social Security Administration: May apply for disability if have a Stage IV cancer. 606-195-0739 819-089-6853 ? [employer], Disability and Transit Services: Assists with nutrition, care and transit needs. 602-275-0119

## 2019-04-01 ENCOUNTER — Other Ambulatory Visit: Payer: Self-pay

## 2019-04-01 ENCOUNTER — Inpatient Hospital Stay (HOSPITAL_COMMUNITY): Payer: Medicare Other

## 2019-04-01 ENCOUNTER — Encounter (HOSPITAL_COMMUNITY): Payer: Self-pay

## 2019-04-01 VITALS — BP 116/47 | HR 57 | Temp 96.9°F | Resp 16

## 2019-04-01 DIAGNOSIS — Z5111 Encounter for antineoplastic chemotherapy: Secondary | ICD-10-CM | POA: Diagnosis not present

## 2019-04-01 DIAGNOSIS — C3491 Malignant neoplasm of unspecified part of right bronchus or lung: Secondary | ICD-10-CM

## 2019-04-01 MED ORDER — SODIUM CHLORIDE 0.9 % IV SOLN
100.0000 mg/m2 | Freq: Once | INTRAVENOUS | Status: AC
  Administered 2019-04-01: 150 mg via INTRAVENOUS
  Filled 2019-04-01: qty 7.5

## 2019-04-01 MED ORDER — SODIUM CHLORIDE 0.9% FLUSH
10.0000 mL | INTRAVENOUS | Status: DC | PRN
  Administered 2019-04-01: 10 mL

## 2019-04-01 MED ORDER — HEPARIN SOD (PORK) LOCK FLUSH 100 UNIT/ML IV SOLN
500.0000 [IU] | Freq: Once | INTRAVENOUS | Status: AC | PRN
  Administered 2019-04-01: 500 [IU]

## 2019-04-01 MED ORDER — SODIUM CHLORIDE 0.9 % IV SOLN: Freq: Once | INTRAVENOUS | Status: AC

## 2019-04-01 MED ORDER — SODIUM CHLORIDE 0.9 % IV SOLN
10.0000 mg | Freq: Once | INTRAVENOUS | Status: AC
  Administered 2019-04-01: 10 mg via INTRAVENOUS
  Filled 2019-04-01: qty 10

## 2019-04-01 NOTE — Patient Instructions (Signed)
[facility] Discharge Instructions for Patients Receiving Chemotherapy   Beginning January 23rd 2017 lab work for the [facility] will be done in the  Main lab at [facility] on 1st floor. If you have a lab appointment with the [facility] please come in thru the  Main Entrance and check in at the main information desk   Today you received the following chemotherapy agents VP-16. Follow-up as scheduled. Call clinic for any questions or concerns  To help prevent nausea and vomiting after your treatment, we encourage you to take your nausea medication   If you develop nausea and vomiting, or diarrhea that is not controlled by your medication, call the clinic.  The clinic phone number is [phone]. Office hours are Monday-Friday 8:30am-5:00pm.  BELOW ARE SYMPTOMS THAT SHOULD BE REPORTED IMMEDIATELY:  *FEVER GREATER THAN 101.0 F  *CHILLS WITH OR WITHOUT FEVER  NAUSEA AND VOMITING THAT IS NOT CONTROLLED WITH YOUR NAUSEA MEDICATION  *UNUSUAL SHORTNESS OF BREATH  *UNUSUAL BRUISING OR BLEEDING  TENDERNESS IN MOUTH AND THROAT WITH OR WITHOUT PRESENCE OF ULCERS  *URINARY PROBLEMS  *BOWEL PROBLEMS  UNUSUAL RASH Items with * indicate a potential emergency and should be followed up as soon as possible. If you have an emergency after office hours please contact your primary care physician or go to the nearest emergency department.  Please call the clinic during office hours if you have any questions or concerns.   You may also contact the Patient Navigator at [phone] should you have any questions or need assistance in obtaining follow up care.      Resources For Cancer Patients and their Caregivers ? American [employer] Society: Can assist with transportation, wigs, general needs, runs Look Good Feel Better.        (215) 544-3409 ? Cancer Care: Provides financial assistance, online support groups, medication/co-pay assistance.  1-800-813-HOPE  603-018-4577) ? [place] Assists [place] cancer patients and their families through emotional , educational and financial support.  609-051-2551 ? [place] DSS Where to apply for food stamps, Medicaid and utility assistance. 951-760-4420 ? [facility]: Transportation to medical appointments. 270-270-5584 ? Social Security Administration: May apply for disability if have a Stage IV cancer. (857) 856-6804 (229) 707-7362 ? [employer], Disability and Transit Services: Assists with nutrition, care and transit needs. 570-189-2379

## 2019-04-01 NOTE — Progress Notes (Signed)
Ashley [name] tolerated VP-16 well without complaints or incident. VSS upon discharge. Pt discharged via wheelchair in satisfactory condition accompanied by her daughter

## 2019-04-02 ENCOUNTER — Encounter (HOSPITAL_COMMUNITY): Payer: Self-pay | Admitting: Hematology

## 2019-04-02 ENCOUNTER — Other Ambulatory Visit (HOSPITAL_COMMUNITY): Payer: Self-pay | Admitting: *Deleted

## 2019-04-02 MED ORDER — HYDROCODONE-ACETAMINOPHEN 5-325 MG PO TABS: 1.0000 | ORAL_TABLET | Freq: Three times a day (TID) | ORAL | 0 refills | Status: DC | PRN

## 2019-04-02 NOTE — Assessment & Plan Note (Signed)
1.  Stage III (T3N1) small cell lung cancer: -Right upper lobectomy on 12/30/2018 shows invasive small cell carcinoma, 4.2 cm, involving visceral pleura, 1 out of 3 lymph nodes positive, lymphovascular invasion positive, metastatic carcinoma in 2, 11 R lymph nodes.  Right chest wall biopsy consistent with small cell carcinoma. -30 Gy of radiation in 10 fractions from 01/28/2019 through 02/10/2019. -PET scan on 01/26/2019 shows dense consolidative airspace disease in the posterior right base, markedly hypermetabolic SUV 8.  Loculated anterior pleural fluid in the right hemithorax with adjacent airspace consolidation is hypermetabolic with SUV 4.1.  Hypermetabolic uptake identified along the right lung staple lines.  2.5 cm focus of soft tissue in the right axilla with SUV 2.2.  Precarinal lymph node, short axis IX millimeters with SUV 2.5.  12 mm short axis subcarinal lymph node with SUV 3. -CT scan dated on 02/22/2019 showed stable findings.  Borderline mediastinal lymph nodes present. -Cycle 1 of chemotherapy with carboplatin and VP-16 on 03/09/2019. -She has experienced severe body pains which lasted 4 to 5 days after Neulasta on pro.  She even took Claritin. -I have recommended change in Neulasta on pro to fulfilla. -She received 1 unit PRBC on 03/18/2019.  We reviewed labs.  Hemoglobin today is 8.2. -We will proceed with cycle 2 today with no dose modifications. -She will come back in 3 weeks for follow-up.  2.  Macrocytic anemia: -Etiology CKD and relative iron deficiency. -She received Feraheme on 03/09/2019 and 03/18/2019.  3.  Weight loss: -Weight today improved to 109.  Usual weight is around 112. -We will consider appetite stimulant if it drops below 105.  4.  Essential thrombocytosis: -She was on Hydrea of 100 mg Tuesday through Friday and 1000 mg on Saturday, Sunday and Monday. -We held Hydrea since 03/02/2019. -She will proceed with her second cycle today.  5.  Peripheral  neuropathy: -She reported pain in the legs at nighttime, burning pain. -She is currently taking gabapentin 300 mg nightly.  We will increase it to 600 mg nightly.  If it improves, will give prescription for higher dose.

## 2019-04-03 ENCOUNTER — Inpatient Hospital Stay (HOSPITAL_COMMUNITY): Payer: Medicare Other

## 2019-04-03 ENCOUNTER — Other Ambulatory Visit: Payer: Self-pay

## 2019-04-03 VITALS — BP 86/50 | HR 78 | Temp 97.5°F | Resp 16

## 2019-04-03 DIAGNOSIS — C3491 Malignant neoplasm of unspecified part of right bronchus or lung: Secondary | ICD-10-CM

## 2019-04-03 DIAGNOSIS — Z5111 Encounter for antineoplastic chemotherapy: Secondary | ICD-10-CM | POA: Diagnosis not present

## 2019-04-03 MED ORDER — PEGFILGRASTIM-JMDB 6 MG/0.6ML ~~LOC~~ SOSY
6.0000 mg | PREFILLED_SYRINGE | Freq: Once | SUBCUTANEOUS | Status: AC
  Administered 2019-04-03: 6 mg via SUBCUTANEOUS

## 2019-04-03 NOTE — Progress Notes (Signed)
Fulphila injection given today per orders. Patient tolerated it well without problems. Vitals stable and discharged home from clinic via wheelchair. Follow up as scheduled. Encouraged patient to take her claritan for 4-5 days post injection to help with bone pain.

## 2019-04-20 ENCOUNTER — Inpatient Hospital Stay (HOSPITAL_COMMUNITY): Payer: Medicare Other

## 2019-04-20 ENCOUNTER — Encounter (HOSPITAL_COMMUNITY): Payer: Self-pay | Admitting: Hematology

## 2019-04-20 ENCOUNTER — Inpatient Hospital Stay (HOSPITAL_BASED_OUTPATIENT_CLINIC_OR_DEPARTMENT_OTHER): Payer: Medicare Other | Admitting: Hematology

## 2019-04-20 ENCOUNTER — Other Ambulatory Visit: Payer: Self-pay

## 2019-04-20 ENCOUNTER — Inpatient Hospital Stay (HOSPITAL_COMMUNITY): Payer: Medicare Other | Attending: Hematology

## 2019-04-20 VITALS — BP 146/82 | HR 73 | Temp 97.5°F | Resp 18

## 2019-04-20 DIAGNOSIS — Z5111 Encounter for antineoplastic chemotherapy: Secondary | ICD-10-CM | POA: Insufficient documentation

## 2019-04-20 DIAGNOSIS — D473 Essential (hemorrhagic) thrombocythemia: Secondary | ICD-10-CM | POA: Diagnosis not present

## 2019-04-20 DIAGNOSIS — C3491 Malignant neoplasm of unspecified part of right bronchus or lung: Secondary | ICD-10-CM

## 2019-04-20 DIAGNOSIS — Z85820 Personal history of malignant melanoma of skin: Secondary | ICD-10-CM | POA: Insufficient documentation

## 2019-04-20 DIAGNOSIS — Z853 Personal history of malignant neoplasm of breast: Secondary | ICD-10-CM | POA: Diagnosis not present

## 2019-04-20 DIAGNOSIS — Z9221 Personal history of antineoplastic chemotherapy: Secondary | ICD-10-CM | POA: Diagnosis not present

## 2019-04-20 DIAGNOSIS — Z90721 Acquired absence of ovaries, unilateral: Secondary | ICD-10-CM | POA: Diagnosis not present

## 2019-04-20 DIAGNOSIS — Z79899 Other long term (current) drug therapy: Secondary | ICD-10-CM | POA: Insufficient documentation

## 2019-04-20 LAB — CBC WITH DIFFERENTIAL/PLATELET
Abs Immature Granulocytes: 0.11 10*3/uL — ABNORMAL HIGH (ref 0.00–0.07)
Basophils Absolute: 0.1 10*3/uL (ref 0.0–0.1)
Basophils Relative: 1 %
Eosinophils Absolute: 0.1 10*3/uL (ref 0.0–0.5)
Eosinophils Relative: 1 %
HCT: 27.2 % — ABNORMAL LOW (ref 36.0–46.0)
Hemoglobin: 8.3 g/dL — ABNORMAL LOW (ref 12.0–15.0)
Immature Granulocytes: 1 %
Lymphocytes Relative: 11 %
Lymphs Abs: 1.3 10*3/uL (ref 0.7–4.0)
MCH: 32.4 pg (ref 26.0–34.0)
MCHC: 30.5 g/dL (ref 30.0–36.0)
MCV: 106.3 fL — ABNORMAL HIGH (ref 80.0–100.0)
Monocytes Absolute: 1.3 10*3/uL — ABNORMAL HIGH (ref 0.1–1.0)
Monocytes Relative: 11 %
Neutro Abs: 8.9 10*3/uL — ABNORMAL HIGH (ref 1.7–7.7)
Neutrophils Relative %: 75 %
Platelets: 368 10*3/uL (ref 150–400)
RBC: 2.56 MIL/uL — ABNORMAL LOW (ref 3.87–5.11)
RDW: 21.2 % — ABNORMAL HIGH (ref 11.5–15.5)
WBC: 11.8 10*3/uL — ABNORMAL HIGH (ref 4.0–10.5)
nRBC: 0 % (ref 0.0–0.2)

## 2019-04-20 LAB — COMPREHENSIVE METABOLIC PANEL
ALT: 10 U/L (ref 0–44)
AST: 17 U/L (ref 15–41)
Albumin: 3.1 g/dL — ABNORMAL LOW (ref 3.5–5.0)
Alkaline Phosphatase: 90 U/L (ref 38–126)
Anion gap: 8 (ref 5–15)
BUN: 15 mg/dL (ref 8–23)
CO2: 27 mmol/L (ref 22–32)
Calcium: 9.1 mg/dL (ref 8.9–10.3)
Chloride: 103 mmol/L (ref 98–111)
Creatinine, Ser: 1.09 mg/dL — ABNORMAL HIGH (ref 0.44–1.00)
GFR calc Af Amer: 60 mL/min — ABNORMAL LOW (ref >60)
GFR calc non Af Amer: 51 mL/min — ABNORMAL LOW (ref >60)
Glucose, Bld: 101 mg/dL — ABNORMAL HIGH (ref 70–99)
Potassium: 3.8 mmol/L (ref 3.5–5.1)
Sodium: 138 mmol/L (ref 135–145)
Total Bilirubin: 0.4 mg/dL (ref 0.3–1.2)
Total Protein: 6.3 g/dL — ABNORMAL LOW (ref 6.5–8.1)

## 2019-04-20 MED ORDER — SODIUM CHLORIDE 0.9 % IV SOLN
100.0000 mg/m2 | Freq: Once | INTRAVENOUS | Status: AC
  Administered 2019-04-20: 150 mg via INTRAVENOUS
  Filled 2019-04-20: qty 7.5

## 2019-04-20 MED ORDER — SODIUM CHLORIDE 0.9 % IV SOLN
Freq: Once | INTRAVENOUS | Status: AC
  Filled 2019-04-20: qty 5

## 2019-04-20 MED ORDER — HYDROCODONE-ACETAMINOPHEN 5-325 MG PO TABS: 1.0000 | ORAL_TABLET | Freq: Three times a day (TID) | ORAL | 0 refills | Status: DC | PRN

## 2019-04-20 MED ORDER — GABAPENTIN 600 MG PO TABS: 600.0000 mg | ORAL_TABLET | Freq: Every day | ORAL | 3 refills | Status: DC

## 2019-04-20 MED ORDER — SODIUM CHLORIDE 0.9 % IV SOLN: Freq: Once | INTRAVENOUS | Status: AC

## 2019-04-20 MED ORDER — PALONOSETRON HCL INJECTION 0.25 MG/5ML
0.2500 mg | Freq: Once | INTRAVENOUS | Status: AC
  Administered 2019-04-20: 0.25 mg via INTRAVENOUS

## 2019-04-20 MED ORDER — SODIUM CHLORIDE 0.9% FLUSH
10.0000 mL | INTRAVENOUS | Status: DC | PRN
  Administered 2019-04-20: 10 mL

## 2019-04-20 MED ORDER — PANTOPRAZOLE SODIUM 40 MG PO TBEC: 40.0000 mg | DELAYED_RELEASE_TABLET | Freq: Two times a day (BID) | ORAL | 2 refills | Status: DC

## 2019-04-20 MED ORDER — SODIUM CHLORIDE 0.9 % IV SOLN
309.0000 mg | Freq: Once | INTRAVENOUS | Status: AC
  Administered 2019-04-20: 310 mg via INTRAVENOUS
  Filled 2019-04-20: qty 31

## 2019-04-20 MED ORDER — HEPARIN SOD (PORK) LOCK FLUSH 100 UNIT/ML IV SOLN
500.0000 [IU] | Freq: Once | INTRAVENOUS | Status: AC | PRN
  Administered 2019-04-20: 500 [IU]

## 2019-04-20 MED ORDER — PALONOSETRON HCL INJECTION 0.25 MG/5ML
INTRAVENOUS | Status: AC
  Filled 2019-04-20: qty 5

## 2019-04-20 NOTE — Patient Instructions (Signed)
[facility] Discharge Instructions for Patients Receiving Chemotherapy  Today you received the following chemotherapy agents   To help prevent nausea and vomiting after your treatment, we encourage you to take your nausea medication   If you develop nausea and vomiting that is not controlled by your nausea medication, call the clinic.   BELOW ARE SYMPTOMS THAT SHOULD BE REPORTED IMMEDIATELY:  *FEVER GREATER THAN 100.5 F  *CHILLS WITH OR WITHOUT FEVER  NAUSEA AND VOMITING THAT IS NOT CONTROLLED WITH YOUR NAUSEA MEDICATION  *UNUSUAL SHORTNESS OF BREATH  *UNUSUAL BRUISING OR BLEEDING  TENDERNESS IN MOUTH AND THROAT WITH OR WITHOUT PRESENCE OF ULCERS  *URINARY PROBLEMS  *BOWEL PROBLEMS  UNUSUAL RASH Items with * indicate a potential emergency and should be followed up as soon as possible.  Feel free to call the clinic should you have any questions or concerns. The clinic phone number is [phone].  Please show the CHEMO ALERT CARD at check-in to the Emergency Department and triage nurse.   

## 2019-04-20 NOTE — Progress Notes (Signed)
Ashley [name], [place] [address]   CLINIC:  Medical Oncology/Hematology  PCP:  Ashley Rival, NP [address] [place] [address] [phone]   REASON FOR VISIT: Small cell lung cancer, essential thrombocytosis.  CURRENT THERAPY:Carboplatin and VP-16.    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 The 3 liver lesions are not hypermetabolic. The radiologist suspects they may be subtly present on prior CT chest from 10/2013, to further reassuring that these are likely benign lesions.   Melanoma ([name])  07/09/2013 Initial Biopsy   Initial biopsy L upper thigh/buttocks melanoma   07/20/2013 Surgery   Excision L lateral buttock/upper thigh melanoma, clear margins   07/20/2013 Pathology Results   Breslow depth 1.02 mm, pT2a    Small cell lung carcinoma, right ([name])  11/27/2018 Initial Diagnosis   Small cell lung carcinoma, right ([name])   03/09/2019 -  Chemotherapy   The patient had palonosetron (ALOXI) injection 0.25 mg, 0.25 mg, Intravenous,  Once, 3 of 4 cycles Administration: 0.25 mg (03/09/2019), 0.25 mg (03/30/2019), 0.25 mg (04/20/2019) pegfilgrastim (NEULASTA ONPRO KIT) injection 6 mg, 6 mg, Subcutaneous, Once, 1 of 1 cycle Administration: 6 mg (03/11/2019) pegfilgrastim-jmdb (FULPHILA) injection 6 mg, 6 mg, Subcutaneous,  Once, 2 of 3 cycles Administration: 6 mg (04/03/2019) CARBOplatin (PARAPLATIN) 330 mg in sodium chloride 0.9 % 250 mL chemo  infusion, 330 mg (100 % of original dose 325.5 mg), Intravenous,  Once, 3 of 4 cycles Dose modification:   (original dose 325.5 mg, Cycle 1),   (original dose 325.5 mg, Cycle 2),   (original dose 309 mg, Cycle 3) Administration: 330 mg (03/09/2019), 330 mg (03/30/2019), 310 mg (04/20/2019) etoposide (VEPESID) 150 mg in sodium chloride 0.9 % 500 mL chemo infusion, 100 mg/m2 = 150 mg, Intravenous,  Once, 3 of 4 cycles Administration: 150 mg (03/09/2019), 150 mg (03/10/2019), 150 mg (03/11/2019), 150 mg (03/30/2019), 150 mg (03/31/2019), 150 mg (04/01/2019), 150 mg (04/20/2019) fosaprepitant (EMEND) 150 mg, dexamethasone (DECADRON) 12 mg in sodium chloride 0.9 % 145 mL IVPB, , Intravenous,  Once, 3 of 4 cycles Administration:  (03/09/2019),  (03/30/2019),  (04/20/2019)  for chemotherapy treatment.    03/09/2019 Cancer Staging   Staging form: Lung, AJCC 8th Edition - Clinical: Stage IIIA (cT3, cN1, cM0) - Signed by Ashley Jack, MD on 03/09/2019      INTERVAL HISTORY:  Ashley [name] 69 y.o. female seen for follow-up of small cell lung cancer and toxicity assessment prior to cycle 3.  She received cycle 2 on 03/30/2019.  She reported pain is lasting about 6 days after full filler injection.  She had to take hydrocodone up to 4 times a day.  She requests refill for it.  She also reported epigastric burning pain mostly on empty stomach and slightly worse on eating.  She is taking Protonix once a day.  She also takes Mylanta 3-4 times a day.  She had occasional nausea and Zofran helped every  time.  Numbness has improved after gabapentin dose increased to 600 mg at bedtime.  REVIEW OF SYSTEMS:  Review of Systems  Constitutional: Positive for fatigue.  Respiratory: Positive for shortness of breath.   Gastrointestinal: Positive for constipation and nausea.  Neurological: Positive for numbness.  All other systems reviewed and are negative.    PAST MEDICAL/SURGICAL HISTORY:  Past Medical History:   Diagnosis Date  . 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 Anginal pain ([place])   . Arthritis   . Ascending aortic aneurysm ([place])   . Cancer [facility]) 1995   breast, left, mastectomy/chemo  . Chest pain    Possibly cardiac. No evidence of ischemia/injury based upon normal troponin I. Chest discomfort could be tachycardia induced supply demand mismatch.   . CHF (congestive heart failure) ([place]) 11/17/2015   after surgery   . Colon adenomas   . Coronary artery disease   . DJD (degenerative joint disease)   . Dyspnea    with exertion  . Emphysema of lung ([place]) 11/17/2015  . Essential hypertension, benign   . GERD (gastroesophageal reflux disease)   . History of hiatal hernia   . Hyperlipidemia   . Hypertension   . Melanoma ([place]) 01/09/2016  . Osteopenia   . Palpitations   . Pernicious anemia 03/06/2016  . Pernicious anemia   . Pure hypercholesterolemia   . Raynaud's disease   . Thrombocythemia, essential ([place]) 01/09/2016  . Thrombocytosis ([facility])    Idiopathic  . Vitamin D deficiency    Past Surgical History:  Procedure Laterality Date  . ABDOMINAL HYSTERECTOMY    . ANTERIOR AND POSTERIOR REPAIR N/A 12/09/2014   Procedure: ANTERIOR (CYSTOCELE) AND POSTERIOR REPAIR (RECTOCELE);  Surgeon: Bjorn Loser, MD;  Location: [facility];  Service: Urology;  Laterality: N/A;  . ANTERIOR CERVICAL DECOMPRESSION/DISCECTOMY FUSION 4 LEVELS Right 10/03/2016   Procedure: ANTERIOR CERVICAL DECOMPRESSION FUSION, CERVICAL 4-5, CERVICAL 5-6, CERVICAL 6-7, CERVICAL 7 TO THORACIC 1 WITH INSTRUMENTATION AND ALLOGRAFT;  Surgeon: Phylliss Bob, MD;  Location: [facility];  Service: Orthopedics;  Laterality: Right;  ANTERIOR CERVICAL DECOMPRESSION FUSION, CERVICAL 4-5, CERVICAL 5-6, CERVICAL 6-7, CERVICAL 7 TO THORACIC 1 WITH INSTRUMENTATION AND ALLOGRAFT; REQUEST 4 HO  . ANTERIOR LAT LUMBAR FUSION Left 11/16/2015   Procedure: LEFT SIDED LATERAL INTERBODY FUSION, LUMBAR 2-3, LUMBAR 3-4, LUMBAR 4-5 WITH  INSTRUMENTATION;  Surgeon: Phylliss Bob, MD;  Location: [facility];  Service: Orthopedics;  Laterality: Left;  LEFT SIDED LATEARL INTERBODY FUSION, LUMBAR 2-3, LUMBAR 3-4, LUMBAR 4-5 WITH INSTRUMENTATION   . APPENDECTOMY    . BACK SURGERY    . BONE MARROW ASPIRATION  07/2012  . BONE MARROW BIOPSY  07/2012  . BREAST SURGERY    . CARDIAC CATHETERIZATION    . CARDIAC CATHETERIZATION N/A 01/20/2016   Procedure: Left Heart Cath and Coronary Angiography;  Surgeon: Burnell Blanks, MD;  Location: [facility];  Service: Cardiovascular;  Laterality: N/A;  . COLONOSCOPY  11/29/2010   Procedure: COLONOSCOPY;  Surgeon: Rogene Houston, MD;  [facility];  Service: Endoscopy;  Laterality: N/A;  . COLONOSCOPY N/A 02/18/2014   Procedure: COLONOSCOPY;  Surgeon: Rogene Houston, MD;  [facility];  Service: Endoscopy;  Laterality: N/A;  1030  . COLONOSCOPY N/A 02/25/2017   Procedure: COLONOSCOPY;  Surgeon: Rogene Houston, MD;  [facility];  Service: Endoscopy;  Laterality: N/A;  10:55  . CYSTOSCOPY N/A 12/09/2014   Procedure: CYSTOSCOPY;  Surgeon: Bjorn Loser, MD;  Location: [facility];  Service: Urology;  Laterality: N/A;  . ESOPHAGEAL DILATION N/A 02/25/2017   Procedure: ESOPHAGEAL DILATION;  Surgeon: Rogene Houston, MD;  [facility];  Service: Endoscopy;  Laterality: N/A;  . ESOPHAGOGASTRODUODENOSCOPY N/A 02/25/2017   Procedure: ESOPHAGOGASTRODUODENOSCOPY (EGD);  Surgeon: Rogene Houston, MD;  [facility];  Service: Endoscopy;  Laterality: N/A;  . IR GENERIC HISTORICAL  01/11/2016   IR RADIOLOGY PERIPHERAL GUIDED IV START 01/11/2016 Saverio Danker, PA-C [facility]  . IR GENERIC HISTORICAL  01/11/2016   IR US GUIDE VASC ACCESS RIGHT 01/11/2016 Saverio Danker, PA-C [facility]  . LYMPH NODE DISSECTION Right 12/30/2018   Procedure: Lymph Node Dissection;  Surgeon: Lajuana Matte, MD;  Location: [facility];  Service: Thoracic;  Laterality:  Right;  . MASTECTOMY     left  . OVARIAN CYST SURGERY     x2  . POLYPECTOMY  02/25/2017   Procedure: POLYPECTOMY;  Surgeon: Rogene Houston, MD;  [facility];  Service: Endoscopy;;  . PORTACATH PLACEMENT Left 02/16/2019   Procedure: INSERTION PORT-A-CATH (CATHETER  LEFT SUBCLAVIAN);  Surgeon: Aviva Signs, MD;  Location[facility];  Service: General;  Laterality: Left;  . SALPINGOOPHORECTOMY Bilateral 12/09/2014   Procedure: SALPINGO OOPHORECTOMY;  Surgeon: Servando Salina, MD;  Location: [facility];  Service: Gynecology;  Laterality: Bilateral;  . TUBAL LIGATION    . VAGINAL HYSTERECTOMY N/A 12/09/2014   Procedure: HYSTERECTOMY VAGINAL;  Surgeon: Servando Salina, MD;  Location: [facility];  Service: Gynecology;  Laterality: N/A;  . VIDEO ASSISTED THORACOSCOPY (VATS)/ LOBECTOMY Right 12/30/2018   Procedure: VIDEO ASSISTED THORACOSCOPY (VATS)/RIGHT LOWER LOBE WEDGE RESECTION, RIGHT UPPER LOBECTOMY;  Surgeon: Lajuana Matte, MD;  Location: [facility];  Service: Thoracic;  Laterality: Right;  Marland Kitchen VIDEO BRONCHOSCOPY N/A 12/30/2018   Procedure: VIDEO BRONCHOSCOPY;  Surgeon: Lajuana Matte, MD;  [facility];  Service: Thoracic;  Laterality: N/A;     SOCIAL HISTORY:  Social History   Socioeconomic History  . Marital status: Divorced    Spouse name: Not on file  . Number of children: 2  . Years of education: Not on file  . Highest education level: Not on file  Occupational History  . Not on file  Tobacco Use  . Smoking status: Former Smoker    Packs/day: 0.50    Years: 50.00    Pack years: 25.00    Types: Cigarettes    Quit date: 11/25/2018    Years since quitting: 0.4  . Smokeless tobacco: Never Used  Substance and Sexual Activity  . Alcohol use: Yes    Comment: occasional  . Drug use: No  . Sexual activity: Yes    Birth control/protection: Post-menopausal  Other Topics Concern  . Not on file  Social History Narrative  . Not on file   Social Determinants of  Health   Financial Resource Strain:   . Difficulty of Paying Living Expenses: Not on file  Food Insecurity:   . Worried About Charity fundraiser in the Last Year: Not on file  . Ran Out of Food in the Last Year: Not on file  Transportation Needs:   . Lack of Transportation (Medical): Not on file  . Lack of Transportation (Non-Medical): Not on file  Physical Activity:   . Days of Exercise per Week: Not on file  . Minutes of Exercise per Session: Not on file  Stress:   . Feeling of Stress : Not on file  Social Connections:   .  Frequency of Communication with Friends and Family: Not on file  . Frequency of Social Gatherings with Friends and Family: Not on file  . Attends Religious Services: Not on file  . Active Member of Clubs or Organizations: Not on file  . Attends Archivist Meetings: Not on file  . Marital Status: Not on file  Intimate Partner Violence:   . Fear of Current or Ex-Partner: Not on file  . Emotionally Abused: Not on file  . Physically Abused: Not on file  . Sexually Abused: Not on file    FAMILY HISTORY:  Family History  Problem Relation Age of Onset  . Hypertension Mother   . Heart failure Mother   . Congestive Heart Failure Mother   . COPD Mother   . Pernicious anemia Mother   . Cancer Mother        lung  . Hypertension Father   . CAD Father   . Heart attack Father   . Hypertension Sister   . Cancer Other   . Celiac disease Other     CURRENT MEDICATIONS:  Outpatient Encounter Medications as of 04/20/2019  Medication Sig  . aspirin EC 81 MG tablet Take 81 mg by mouth daily.  . Calcium Carb-Cholecalciferol (CALCIUM 600+D) 600-800 MG-UNIT TABS Take 1 tablet by mouth 2 (two) times daily.  Marland Kitchen CARBOPLATIN IV Inject into the vein every 21 ( twenty-one) days.   . Cholecalciferol (VITAMIN D-3) 1000 units CAPS Take 1,000 Units daily by mouth.   . clonazePAM (KLONOPIN) 0.5 MG tablet Take 1 tablet (0.5 mg total) by mouth at bedtime. If take this at  bedtime, do NOT take an Oxycodone at night  . cyanocobalamin (,VITAMIN B-12,) 1000 MCG/ML injection INJECT 1 ML INTO THE MUSCLE ONCE MONTHLY AS DIRECTED. (Patient taking differently: Inject 1,000 mcg into the muscle every 30 (thirty) days. )  . diltiazem (TIAZAC) 240 MG 24 hr capsule Take by mouth.  . ETOPOSIDE IV Inject into the vein every 21 ( twenty-one) days.  Marland Kitchen gabapentin (NEURONTIN) 600 MG tablet Take 1 tablet (600 mg total) by mouth daily.  Marland Kitchen ibandronate (BONIVA) 150 MG tablet Take 150 mg by mouth every 30 (thirty) days.   . pantoprazole (PROTONIX) 40 MG tablet Take 1 tablet (40 mg total) by mouth 2 (two) times daily.  . rosuvastatin (CRESTOR) 40 MG tablet Take 40 mg at bedtime by mouth.   . [DISCONTINUED] gabapentin (NEURONTIN) 600 MG tablet Take 600 mg by mouth daily.  . [DISCONTINUED] pantoprazole (PROTONIX) 40 MG tablet Take 40 mg daily by mouth.  Marland Kitchen albuterol (PROVENTIL HFA;VENTOLIN HFA) 108 (90 Base) MCG/ACT inhaler Inhale 2 puffs into the lungs every 6 (six) hours as needed for wheezing or shortness of breath. (Patient not taking: Reported on 04/20/2019)  . guaiFENesin (MUCINEX) 600 MG 12 hr tablet Take 1 tablet (600 mg total) by mouth 2 (two) times daily as needed for cough or to loosen phlegm. (Patient not taking: Reported on 04/20/2019)  . HYDROcodone-acetaminophen (NORCO/VICODIN) 5-325 MG tablet Take 1 tablet by mouth every 8 (eight) hours as needed for moderate pain.  Marland Kitchen ibuprofen (ADVIL,MOTRIN) 800 MG tablet Take 800 mg every 8 (eight) hours as needed by mouth for moderate pain.  Marland Kitchen ipratropium-albuterol (DUONEB) 0.5-2.5 (3) MG/3ML SOLN Take 3 mLs by nebulization every 6 (six) hours as needed (shortness of breath). (Patient not taking: Reported on 04/20/2019)  . lidocaine-prilocaine (EMLA) cream Apply a small amount to port a cath site and cover with plastic wrap 1 hour  prior to chemotherapy appointments (Patient not taking: Reported on 04/20/2019)  . methocarbamol (ROBAXIN) 500 MG tablet  Take 500 mg by mouth every 6 (six) hours as needed for muscle spasms.   . ondansetron (ZOFRAN) 8 MG tablet Take 1 tablet (8 mg total) by mouth every 8 (eight) hours as needed for nausea or vomiting. (Patient not taking: Reported on 04/20/2019)  . OXYGEN Inhale 3 L into the lungs daily.  . polyethylene glycol (MIRALAX / GLYCOLAX) 17 g packet Take 17 g by mouth daily as needed for mild constipation.  . prochlorperazine (COMPAZINE) 10 MG tablet Take 1 tablet (10 mg total) by mouth every 6 (six) hours as needed (Nausea or vomiting). (Patient not taking: Reported on 04/20/2019)  . [DISCONTINUED] gabapentin (NEURONTIN) 300 MG capsule Take 300 mg by mouth at bedtime.  . [DISCONTINUED] HYDROcodone-acetaminophen (NORCO/VICODIN) 5-325 MG tablet Take 1 tablet by mouth every 8 (eight) hours as needed for moderate pain. (Patient not taking: Reported on 04/20/2019)   No facility-administered encounter medications on file as of 04/20/2019.    ALLERGIES:  Allergies  Allergen Reactions  . Penicillins Hives and Rash    Did it involve swelling of the face/tongue/throat, SOB, or low BP? No Did it involve sudden or severe rash/hives, skin peeling, or any reaction on the inside of your mouth or nose? No Did you need to seek medical attention at a hospital or doctor's office? No When did it last happen?5-10 year If all above answers are "NO", may proceed with cephalosporin use.    . Tape Rash    Medipore, Coban, and paper tape CAN be tolerated     PHYSICAL EXAM:  ECOG Performance status: 1  Vitals:   04/20/19 0759 04/20/19 0813  BP: (!) 149/71 (!) 149/79  Pulse: 71 71  Resp: 18 18  Temp: (!) 97.1 F (36.2 C) (!) 97.1 F (36.2 C)  SpO2: 97% 97%   Filed Weights   04/20/19 0759 04/20/19 0813  Weight: 107 lb 6.4 oz (48.7 kg) 107 lb 6.4 oz (48.7 kg)    Physical Exam Constitutional:      Appearance: Normal appearance. She is normal weight.  Cardiovascular:     Rate and Rhythm: Normal rate and  regular rhythm.     Heart sounds: Normal heart sounds.  Pulmonary:     Effort: Pulmonary effort is normal.     Breath sounds: Normal breath sounds.  Abdominal:     General: Abdomen is flat. There is no distension.     Palpations: Abdomen is soft. There is no mass.  Musculoskeletal:        General: Normal range of motion.  Skin:    General: Skin is warm and dry.  Neurological:     Mental Status: She is alert and oriented to person, place, and time. Mental status is at baseline.  Psychiatric:        Mood and Affect: Mood normal.        Behavior: Behavior normal.        Thought Content: Thought content normal.        Judgment: Judgment normal.      LABORATORY DATA:  I have reviewed the labs as listed.  CBC    Component Value Date/Time   WBC 11.8 (H) 04/20/2019 0800   RBC 2.56 (L) 04/20/2019 0800   HGB 8.3 (L) 04/20/2019 0800   HCT 27.2 (L) 04/20/2019 0800   PLT 368 04/20/2019 0800   MCV 106.3 (H) 04/20/2019 0800   MCH  32.4 04/20/2019 0800   MCHC 30.5 04/20/2019 0800   RDW 21.2 (H) 04/20/2019 0800   LYMPHSABS 1.3 04/20/2019 0800   MONOABS 1.3 (H) 04/20/2019 0800   EOSABS 0.1 04/20/2019 0800   BASOSABS 0.1 04/20/2019 0800   CMP Latest Ref Rng & Units 04/20/2019 03/30/2019 03/18/2019  Glucose 70 - 99 mg/dL 101(H) 98 122(H)  BUN 8 - 23 mg/dL _0 Creatinine 0.44 - 1.00 mg/dL 1.09(H) 0.93 0.95  Sodium 135 - 145 mmol/L 138 140 136  Potassium 3.5 - 5.1 mmol/L 3.8 3.5 3.3(L)  Chloride 98 - 111 mmol/L 103 107 103  CO2 22 - 32 mmol/L _1 Calcium 8.9 - 10.3 mg/dL 9.1 9.3 9.0  Total Protein 6.5 - 8.1 g/dL 6.3(L) 6.6 6.2(L)  Total Bilirubin 0.3 - 1.2 mg/dL 0.4 0.4 0.5  Alkaline Phos 38 - 126 U/L 90 75 62  AST 15 - 41 U/L 17 15 13(L)  ALT 0 - 44 U/L _2 DIAGNOSTIC IMAGING:  I have independently reviewed the scans and discussed with the patient.     ASSESSMENT & PLAN:   Small cell lung carcinoma, right ([facility]) 1.  Stage III (T3N1) small cell lung  cancer: -Right upper lobectomy on 12/30/2018 shows invasive small cell carcinoma, 4.2 cm, involving visceral pleura, 1 out of 3 lymph nodes positive, lymphovascular invasion positive, metastatic carcinoma in 2, 11 R lymph nodes.  Right chest wall biopsy consistent with small cell carcinoma. -30 Gy of radiation in 10 fractions from 01/28/2019 through 02/10/2019. -PET scan on 01/26/2019 shows dense consolidative airspace disease in the posterior right base, markedly hypermetabolic SUV 8.  Loculated anterior pleural fluid in the right hemithorax with adjacent airspace consolidation is hypermetabolic with SUV 4.1.  Hypermetabolic uptake identified along the right lung staple lines.  2.5 cm focus of soft tissue in the right axilla with SUV 2.2.  Precarinal lymph node, short axis IX millimeters with SUV 2.5.  12 mm short axis subcarinal lymph node with SUV 3. -CT scan dated on 02/22/2019 showed stable findings.  Borderline mediastinal lymph nodes present. -2 cycles of chemotherapy with carboplatin and VP-16 on 03/09/2019 on 03/30/2019. -She received 1 unit of PRBC on 03/18/2019. -We reviewed her labs.  She will proceed with cycle 3 without any dose modifications. -She reported leg and body pains lasting about 6 days after last cycle of chemotherapy.  She had to take hydrocodone 4 times a day.  Hence I will give a refill. -We will see her back in 3 weeks for follow-up.  2.  Macrocytic anemia: -She has received Feraheme in the past.  This is from myelosuppression from chemotherapy. -Hemoglobin today is 8.3.  She does not require any transfusion.  3.  Weight loss: -She lost couple of pounds today.  Usual weight is around 112.  Last weight was 109. -We will consider Marinol if weight drops below 105. -She is drinking clear Ensure less than 1 can/day.  4.  Essential thrombocytosis: -Hydrea is on hold since 03/02/2019. -We will consider restarting it after completion of chemotherapy.  5.  Peripheral  neuropathy: -She has burning pain in the legs at nighttime. -We have increased her gabapentin to 600 mg at bedtime.  This has helped.  We will send another prescription.  6.  Epigastric pain: -She reported burning pain in the epigastric region in the last couple of weeks. -She is taking Tums as needed.  She is also taking Mylanta  3-4 times a day on some days.  Pain is reported both on empty stomach and after eating.  -She is already on Protonix.  I will increase it to twice daily.   Orders placed this encounter:  No orders of the defined types were placed in this encounter.     Ashley Jack, MD [facility] [phone]

## 2019-04-20 NOTE — Progress Notes (Signed)
Patient presents today for follow up visit with Dr. Delton Coombes and treatment. MAR reviewed. Labs pending. Vital signs within parameters for treatment. Patient has complaints of abdominal pain that she rates a 7/10. Patient describes pain as sharp and cramping. Patient requesting a Gabapentin refill due to Dr. Delton Coombes increased her dose and she is about to run out. Patient requesting a script for pain medication.   Message received to proceed with treatment and give a extra 511ml bolus of NS per ATravis LPN.   Called and spoke with Dr. Delton Coombes. Verbal order received to run the bolus of NS 57mls in 30 minutes.    Treatment given today per MD orders. Tolerated infusion without adverse affects. Vital signs stable. No complaints at this time. Discharged from clinic via wheel chair.  F/U with [facility] as scheduled.

## 2019-04-20 NOTE — Assessment & Plan Note (Addendum)
1.  Stage III (T3N1) small cell lung cancer: -Right upper lobectomy on 12/30/2018 shows invasive small cell carcinoma, 4.2 cm, involving visceral pleura, 1 out of 3 lymph nodes positive, lymphovascular invasion positive, metastatic carcinoma in 2, 11 R lymph nodes.  Right chest wall biopsy consistent with small cell carcinoma. -30 Gy of radiation in 10 fractions from 01/28/2019 through 02/10/2019. -PET scan on 01/26/2019 shows dense consolidative airspace disease in the posterior right base, markedly hypermetabolic SUV 8.  Loculated anterior pleural fluid in the right hemithorax with adjacent airspace consolidation is hypermetabolic with SUV 4.1.  Hypermetabolic uptake identified along the right lung staple lines.  2.5 cm focus of soft tissue in the right axilla with SUV 2.2.  Precarinal lymph node, short axis IX millimeters with SUV 2.5.  12 mm short axis subcarinal lymph node with SUV 3. -CT scan dated on 02/22/2019 showed stable findings.  Borderline mediastinal lymph nodes present. -2 cycles of chemotherapy with carboplatin and VP-16 on 03/09/2019 on 03/30/2019. -She received 1 unit of PRBC on 03/18/2019. -We reviewed her labs.  She will proceed with cycle 3 without any dose modifications. -She reported leg and body pains lasting about 6 days after last cycle of chemotherapy.  She had to take hydrocodone 4 times a day.  Hence I will give a refill. -We will see her back in 3 weeks for follow-up.  2.  Macrocytic anemia: -She has received Feraheme in the past.  This is from myelosuppression from chemotherapy. -Hemoglobin today is 8.3.  She does not require any transfusion.  3.  Weight loss: -She lost couple of pounds today.  Usual weight is around 112.  Last weight was 109. -We will consider Marinol if weight drops below 105. -She is drinking clear Ensure less than 1 can/day.  4.  Essential thrombocytosis: -Hydrea is on hold since 03/02/2019. -We will consider restarting it after completion of  chemotherapy.  5.  Peripheral neuropathy: -She has burning pain in the legs at nighttime. -We have increased her gabapentin to 600 mg at bedtime.  This has helped.  We will send another prescription.  6.  Epigastric pain: -She reported burning pain in the epigastric region in the last couple of weeks. -She is taking Tums as needed.  She is also taking Mylanta 3-4 times a day on some days.  Pain is reported both on empty stomach and after eating.  -She is already on Protonix.  I will increase it to twice daily.

## 2019-04-20 NOTE — Patient Instructions (Addendum)
[place] at [facility] Discharge Instructions  You were seen today by Dr. Delton Coombes. He went over your recent lab results. Start taking the Protonix twice a day. He will send in a new prescription for the gabapentin. He will see you back in 3 weeks for labs, treatment and follow up.   Thank you for choosing [place] at [facility] to provide your oncology and hematology care.  To afford each patient quality time with our provider, please arrive at least 15 minutes before your scheduled appointment time.   If you have a lab appointment with the [place] please come in thru the  Main Entrance and check in at the main information desk  You need to re-schedule your appointment should you arrive 10 or more minutes late.  We strive to give you quality time with our providers, and arriving late affects you and other patients whose appointments are after yours.  Also, if you no show three or more times for appointments you may be dismissed from the clinic at the providers discretion.     Again, thank you for choosing [facility].  Our hope is that these requests will decrease the amount of time that you wait before being seen by our physicians.       _____________________________________________________________  Should you have questions after your visit to [facility], please contact our office at [phone] between the hours of 8:00 a.m. and 4:30 p.m.  Voicemails left after 4:00 p.m. will not be returned until the following business day.  For prescription refill requests, have your pharmacy contact our office and allow 72 hours.    Cancer Center Support Programs:   > Cancer Support Group  2nd Tuesday of the month 1pm-2pm, Journey Room

## 2019-04-21 ENCOUNTER — Encounter (HOSPITAL_COMMUNITY): Payer: Self-pay

## 2019-04-21 ENCOUNTER — Inpatient Hospital Stay (HOSPITAL_COMMUNITY): Payer: Medicare Other

## 2019-04-21 VITALS — BP 144/81 | HR 74 | Temp 97.1°F | Resp 17

## 2019-04-21 DIAGNOSIS — C3491 Malignant neoplasm of unspecified part of right bronchus or lung: Secondary | ICD-10-CM

## 2019-04-21 DIAGNOSIS — Z5111 Encounter for antineoplastic chemotherapy: Secondary | ICD-10-CM | POA: Diagnosis not present

## 2019-04-21 MED ORDER — SODIUM CHLORIDE 0.9 % IV SOLN
10.0000 mg | Freq: Once | INTRAVENOUS | Status: AC
  Administered 2019-04-21: 10 mg via INTRAVENOUS
  Filled 2019-04-21: qty 10

## 2019-04-21 MED ORDER — SODIUM CHLORIDE 0.9 % IV SOLN: Freq: Once | INTRAVENOUS | Status: AC

## 2019-04-21 MED ORDER — SODIUM CHLORIDE 0.9 % IV SOLN
100.0000 mg/m2 | Freq: Once | INTRAVENOUS | Status: AC
  Administered 2019-04-21: 150 mg via INTRAVENOUS
  Filled 2019-04-21: qty 7.5

## 2019-04-21 MED ORDER — SODIUM CHLORIDE 0.9% FLUSH
10.0000 mL | INTRAVENOUS | Status: DC | PRN
  Administered 2019-04-21: 10 mL

## 2019-04-21 MED ORDER — HEPARIN SOD (PORK) LOCK FLUSH 100 UNIT/ML IV SOLN
500.0000 [IU] | Freq: Once | INTRAVENOUS | Status: AC | PRN
  Administered 2019-04-21: 500 [IU]

## 2019-04-21 NOTE — Progress Notes (Signed)
Patient presents today for Day 2 VP-16. Patient has no complaints of any changes since the last visit.   Treatment given today per MD orders. Tolerated infusion without adverse affects. Vital signs stable. No complaints at this time. Discharged from clinic via wheel chair.  F/U with [facility] as scheduled.

## 2019-04-21 NOTE — Patient Instructions (Signed)
[facility] Discharge Instructions for Patients Receiving Chemotherapy  Today you received the following chemotherapy agents   To help prevent nausea and vomiting after your treatment, we encourage you to take your nausea medication   If you develop nausea and vomiting that is not controlled by your nausea medication, call the clinic.   BELOW ARE SYMPTOMS THAT SHOULD BE REPORTED IMMEDIATELY:  *FEVER GREATER THAN 100.5 F  *CHILLS WITH OR WITHOUT FEVER  NAUSEA AND VOMITING THAT IS NOT CONTROLLED WITH YOUR NAUSEA MEDICATION  *UNUSUAL SHORTNESS OF BREATH  *UNUSUAL BRUISING OR BLEEDING  TENDERNESS IN MOUTH AND THROAT WITH OR WITHOUT PRESENCE OF ULCERS  *URINARY PROBLEMS  *BOWEL PROBLEMS  UNUSUAL RASH Items with * indicate a potential emergency and should be followed up as soon as possible.  Feel free to call the clinic should you have any questions or concerns. The clinic phone number is [phone].  Please show the CHEMO ALERT CARD at check-in to the Emergency Department and triage nurse.   

## 2019-04-22 ENCOUNTER — Inpatient Hospital Stay (HOSPITAL_COMMUNITY): Payer: Medicare Other

## 2019-04-22 ENCOUNTER — Other Ambulatory Visit: Payer: Self-pay

## 2019-04-22 VITALS — BP 144/77 | HR 68 | Temp 97.2°F | Resp 16

## 2019-04-22 DIAGNOSIS — C3491 Malignant neoplasm of unspecified part of right bronchus or lung: Secondary | ICD-10-CM

## 2019-04-22 DIAGNOSIS — Z5111 Encounter for antineoplastic chemotherapy: Secondary | ICD-10-CM | POA: Diagnosis not present

## 2019-04-22 MED ORDER — SODIUM CHLORIDE 0.9 % IV SOLN
100.0000 mg/m2 | Freq: Once | INTRAVENOUS | Status: AC
  Administered 2019-04-22: 150 mg via INTRAVENOUS
  Filled 2019-04-22: qty 7.5

## 2019-04-22 MED ORDER — SODIUM CHLORIDE 0.9 % IV SOLN: Freq: Once | INTRAVENOUS | Status: AC

## 2019-04-22 MED ORDER — SODIUM CHLORIDE 0.9% FLUSH
10.0000 mL | INTRAVENOUS | Status: DC | PRN
  Administered 2019-04-22 (×2): 10 mL

## 2019-04-22 MED ORDER — SODIUM CHLORIDE 0.9 % IV SOLN
10.0000 mg | Freq: Once | INTRAVENOUS | Status: AC
  Administered 2019-04-22: 10 mg via INTRAVENOUS
  Filled 2019-04-22: qty 10

## 2019-04-22 MED ORDER — HEPARIN SOD (PORK) LOCK FLUSH 100 UNIT/ML IV SOLN
500.0000 [IU] | Freq: Once | INTRAVENOUS | Status: AC | PRN
  Administered 2019-04-22: 500 [IU]

## 2019-04-22 NOTE — Patient Instructions (Signed)
[facility] Discharge Instructions for Patients Receiving Chemotherapy  Today you received the following chemotherapy agents   To help prevent nausea and vomiting after your treatment, we encourage you to take your nausea medication   If you develop nausea and vomiting that is not controlled by your nausea medication, call the clinic.   BELOW ARE SYMPTOMS THAT SHOULD BE REPORTED IMMEDIATELY:  *FEVER GREATER THAN 100.5 F  *CHILLS WITH OR WITHOUT FEVER  NAUSEA AND VOMITING THAT IS NOT CONTROLLED WITH YOUR NAUSEA MEDICATION  *UNUSUAL SHORTNESS OF BREATH  *UNUSUAL BRUISING OR BLEEDING  TENDERNESS IN MOUTH AND THROAT WITH OR WITHOUT PRESENCE OF ULCERS  *URINARY PROBLEMS  *BOWEL PROBLEMS  UNUSUAL RASH Items with * indicate a potential emergency and should be followed up as soon as possible.  Feel free to call the clinic should you have any questions or concerns. The clinic phone number is [phone].  Please show the CHEMO ALERT CARD at check-in to the Emergency Department and triage nurse.   

## 2019-04-22 NOTE — Progress Notes (Signed)
Treatment given today per MD orders. Tolerated infusion without adverse affects. Vital signs stable. No complaints at this time. Discharged from clinic via wheel chair.  F/U with [facility] as scheduled.

## 2019-04-24 ENCOUNTER — Encounter (HOSPITAL_COMMUNITY): Payer: Self-pay

## 2019-04-24 ENCOUNTER — Inpatient Hospital Stay (HOSPITAL_COMMUNITY): Payer: Medicare Other

## 2019-04-24 ENCOUNTER — Other Ambulatory Visit: Payer: Self-pay

## 2019-04-24 VITALS — BP 113/75 | HR 76 | Temp 97.0°F | Resp 18

## 2019-04-24 DIAGNOSIS — C3491 Malignant neoplasm of unspecified part of right bronchus or lung: Secondary | ICD-10-CM

## 2019-04-24 DIAGNOSIS — Z5111 Encounter for antineoplastic chemotherapy: Secondary | ICD-10-CM | POA: Diagnosis not present

## 2019-04-24 MED ORDER — PEGFILGRASTIM-JMDB 6 MG/0.6ML ~~LOC~~ SOSY
6.0000 mg | PREFILLED_SYRINGE | Freq: Once | SUBCUTANEOUS | Status: AC
  Administered 2019-04-24: 6 mg via SUBCUTANEOUS

## 2019-04-24 MED ORDER — PEGFILGRASTIM-JMDB 6 MG/0.6ML ~~LOC~~ SOSY
PREFILLED_SYRINGE | SUBCUTANEOUS | Status: AC
  Filled 2019-04-24: qty 0.6

## 2019-04-24 NOTE — Patient Instructions (Signed)
[place] at [name] Discharge Instructions  Received Fulphila injection today. Follow-up as scheduled. Call clinic for any questions or concerns   Thank you for choosing [place] at [facility] to provide your oncology and hematology care.  To afford each patient quality time with our provider, please arrive at least 15 minutes before your scheduled appointment time.   If you have a lab appointment with the [place] please come in thru the Main Entrance and check in at the main information desk.  You need to re-schedule your appointment should you arrive 10 or more minutes late.  We strive to give you quality time with our providers, and arriving late affects you and other patients whose appointments are after yours.  Also, if you no show three or more times for appointments you may be dismissed from the clinic at the providers discretion.     Again, thank you for choosing [facility].  Our hope is that these requests will decrease the amount of time that you wait before being seen by our physicians.       _____________________________________________________________  Should you have questions after your visit to [facility], please contact our office at [phone] between the hours of 8:00 a.m. and 4:30 p.m.  Voicemails left after 4:00 p.m. will not be returned until the following business day.  For prescription refill requests, have your pharmacy contact our office and allow 72 hours.    Due to Covid, you will need to wear a mask upon entering the hospital. If you do not have a mask, a mask will be given to you at the Main Entrance upon arrival. For doctor visits, patients may have 1 support person with them. For treatment visits, patients can not have anyone with them due to social distancing guidelines and our immunocompromised population.

## 2019-04-24 NOTE — Progress Notes (Signed)
Ashley [name] tolerated Fulphila injection well without complaints or incident. VSS Pt discharged via wheelchair in satisfactory condition accompanied by her daughter

## 2019-05-06 ENCOUNTER — Other Ambulatory Visit (HOSPITAL_COMMUNITY): Payer: Self-pay | Admitting: Hematology

## 2019-05-06 DIAGNOSIS — C50912 Malignant neoplasm of unspecified site of left female breast: Secondary | ICD-10-CM

## 2019-05-11 ENCOUNTER — Inpatient Hospital Stay (HOSPITAL_BASED_OUTPATIENT_CLINIC_OR_DEPARTMENT_OTHER): Payer: Medicare Other | Admitting: Hematology

## 2019-05-11 ENCOUNTER — Inpatient Hospital Stay (HOSPITAL_COMMUNITY): Payer: Medicare Other

## 2019-05-11 ENCOUNTER — Other Ambulatory Visit: Payer: Self-pay

## 2019-05-11 ENCOUNTER — Encounter (HOSPITAL_COMMUNITY): Payer: Self-pay | Admitting: Hematology

## 2019-05-11 VITALS — BP 113/64 | HR 79 | Temp 97.1°F | Resp 16 | Wt 106.8 lb

## 2019-05-11 VITALS — BP 136/74 | HR 79 | Temp 96.8°F | Resp 16

## 2019-05-11 DIAGNOSIS — C3491 Malignant neoplasm of unspecified part of right bronchus or lung: Secondary | ICD-10-CM | POA: Diagnosis not present

## 2019-05-11 DIAGNOSIS — Z5111 Encounter for antineoplastic chemotherapy: Secondary | ICD-10-CM | POA: Diagnosis not present

## 2019-05-11 LAB — CBC WITH DIFFERENTIAL/PLATELET
Abs Immature Granulocytes: 0.07 10*3/uL (ref 0.00–0.07)
Basophils Absolute: 0.1 10*3/uL (ref 0.0–0.1)
Basophils Relative: 1 %
Eosinophils Absolute: 0.1 10*3/uL (ref 0.0–0.5)
Eosinophils Relative: 1 %
HCT: 28.5 % — ABNORMAL LOW (ref 36.0–46.0)
Hemoglobin: 8.6 g/dL — ABNORMAL LOW (ref 12.0–15.0)
Immature Granulocytes: 1 %
Lymphocytes Relative: 9 %
Lymphs Abs: 1 10*3/uL (ref 0.7–4.0)
MCH: 31.6 pg (ref 26.0–34.0)
MCHC: 30.2 g/dL (ref 30.0–36.0)
MCV: 104.8 fL — ABNORMAL HIGH (ref 80.0–100.0)
Monocytes Absolute: 1.1 10*3/uL — ABNORMAL HIGH (ref 0.1–1.0)
Monocytes Relative: 9 %
Neutro Abs: 9.6 10*3/uL — ABNORMAL HIGH (ref 1.7–7.7)
Neutrophils Relative %: 79 %
Platelets: 395 10*3/uL (ref 150–400)
RBC: 2.72 MIL/uL — ABNORMAL LOW (ref 3.87–5.11)
RDW: 18.6 % — ABNORMAL HIGH (ref 11.5–15.5)
WBC: 12 10*3/uL — ABNORMAL HIGH (ref 4.0–10.5)
nRBC: 0 % (ref 0.0–0.2)

## 2019-05-11 LAB — COMPREHENSIVE METABOLIC PANEL
ALT: 11 U/L (ref 0–44)
AST: 17 U/L (ref 15–41)
Albumin: 3.2 g/dL — ABNORMAL LOW (ref 3.5–5.0)
Alkaline Phosphatase: 95 U/L (ref 38–126)
Anion gap: 9 (ref 5–15)
BUN: 17 mg/dL (ref 8–23)
CO2: 24 mmol/L (ref 22–32)
Calcium: 8.8 mg/dL — ABNORMAL LOW (ref 8.9–10.3)
Chloride: 102 mmol/L (ref 98–111)
Creatinine, Ser: 0.97 mg/dL (ref 0.44–1.00)
GFR calc Af Amer: >60 mL/min (ref >60)
GFR calc non Af Amer: 59 mL/min — ABNORMAL LOW (ref >60)
Glucose, Bld: 112 mg/dL — ABNORMAL HIGH (ref 70–99)
Potassium: 3.8 mmol/L (ref 3.5–5.1)
Sodium: 135 mmol/L (ref 135–145)
Total Bilirubin: 0.6 mg/dL (ref 0.3–1.2)
Total Protein: 6.4 g/dL — ABNORMAL LOW (ref 6.5–8.1)

## 2019-05-11 MED ORDER — HEPARIN SOD (PORK) LOCK FLUSH 100 UNIT/ML IV SOLN
500.0000 [IU] | Freq: Once | INTRAVENOUS | Status: AC | PRN
  Administered 2019-05-11: 14:00:00 500 [IU]

## 2019-05-11 MED ORDER — PALONOSETRON HCL INJECTION 0.25 MG/5ML
0.2500 mg | Freq: Once | INTRAVENOUS | Status: AC
  Administered 2019-05-11: 11:00:00 0.25 mg via INTRAVENOUS
  Filled 2019-05-11: qty 5

## 2019-05-11 MED ORDER — SODIUM CHLORIDE 0.9 % IV SOLN
325.5000 mg | Freq: Once | INTRAVENOUS | Status: AC
  Administered 2019-05-11: 12:00:00 330 mg via INTRAVENOUS
  Filled 2019-05-11: qty 33

## 2019-05-11 MED ORDER — SODIUM CHLORIDE 0.9% FLUSH
10.0000 mL | INTRAVENOUS | Status: DC | PRN
  Administered 2019-05-11: 10:00:00 10 mL

## 2019-05-11 MED ORDER — SODIUM CHLORIDE 0.9 % IV SOLN: Freq: Once | INTRAVENOUS | Status: AC

## 2019-05-11 MED ORDER — SODIUM CHLORIDE 0.9 % IV SOLN
100.0000 mg/m2 | Freq: Once | INTRAVENOUS | Status: AC
  Administered 2019-05-11: 12:00:00 150 mg via INTRAVENOUS
  Filled 2019-05-11: qty 7.5

## 2019-05-11 MED ORDER — SODIUM CHLORIDE 0.9 % IV SOLN
Freq: Once | INTRAVENOUS | Status: AC
  Filled 2019-05-11: qty 5

## 2019-05-11 NOTE — Patient Instructions (Signed)
[facility] Discharge Instructions for Patients Receiving Chemotherapy   Beginning January 23rd 2017 lab work for the [facility] will be done in the  Main lab at [facility] on 1st floor. If you have a lab appointment with the [place] please come in thru the  Main Entrance and check in at the main information desk   Today you received the following chemotherapy agents Carboplatin and VP-16.  To help prevent nausea and vomiting after your treatment, we encourage you to take your nausea medication   If you develop nausea and vomiting, or diarrhea that is not controlled by your medication, call the clinic.  The clinic phone number is [phone]. Office hours are Monday-Friday 8:30am-5:00pm.  BELOW ARE SYMPTOMS THAT SHOULD BE REPORTED IMMEDIATELY:  *FEVER GREATER THAN 101.0 F  *CHILLS WITH OR WITHOUT FEVER  NAUSEA AND VOMITING THAT IS NOT CONTROLLED WITH YOUR NAUSEA MEDICATION  *UNUSUAL SHORTNESS OF BREATH  *UNUSUAL BRUISING OR BLEEDING  TENDERNESS IN MOUTH AND THROAT WITH OR WITHOUT PRESENCE OF ULCERS  *URINARY PROBLEMS  *BOWEL PROBLEMS  UNUSUAL RASH Items with * indicate a potential emergency and should be followed up as soon as possible. If you have an emergency after office hours please contact your primary care physician or go to the nearest emergency department.  Please call the clinic during office hours if you have any questions or concerns.   You may also contact the Patient Navigator at [phone] should you have any questions or need assistance in obtaining follow up care.      Resources For Cancer Patients and their Caregivers ? American Cancer Society: Can assist with transportation, wigs, general needs, runs Look Good Feel Better.        409 594 2564 ? Cancer Care: Provides financial assistance, online support groups, medication/co-pay assistance.  1-800-813-HOPE (618)152-9075) ? [place] Assists  [place] cancer patients and their families through emotional , educational and financial support.  (208)429-5608 ? [place] DSS Where to apply for food stamps, Medicaid and utility assistance. (414)522-8633 ? [employer]: Transportation to medical appointments. 938-205-1176 ? Social Security Administration: May apply for disability if have a Stage IV cancer. 279-757-8780 956-730-3646 ? [employer], Disability and Transit Services: Assists with nutrition, care and transit needs. [phone]

## 2019-05-11 NOTE — Progress Notes (Signed)
1010- Patient seen by Dr. Delton Coombes and all lab results and VS reviewed. Per MD, ok to proceed with treatment today.  Ashley [name] tolerated treatment without incident or complaint. VSS upon completion of treatment. Port flushed and left accessed for use tomorrow. Discharged in satisfactory condition with follow up instructions.

## 2019-05-11 NOTE — Progress Notes (Signed)
Patient has been assessed, vital signs and labs have been reviewed by Dr. Katragadda. ANC, Creatinine, LFTs, and Platelets are within treatment parameters per Dr. Katragadda. The patient is good to proceed with treatment at this time.  

## 2019-05-11 NOTE — Assessment & Plan Note (Signed)
1.  Stage III (T3N1) small cell lung cancer: -Right upper lobectomy on 12/30/2018 shows invasive small cell carcinoma, 4.2 cm, involving visceral pleura, 1 out of 3 lymph nodes positive, lymphovascular invasion positive, metastatic carcinoma in 2, 11 R lymph nodes.  Right chest wall biopsy consistent with small cell carcinoma. -30 Gy of radiation in 10 fractions from 01/28/2019 through 02/10/2019. -PET scan on 01/26/2019 shows dense consolidative airspace disease in the posterior right base, markedly hypermetabolic SUV 8.  Loculated anterior pleural fluid in the right hemithorax with adjacent airspace consolidation is hypermetabolic with SUV 4.1.  Hypermetabolic uptake identified along the right lung staple lines.  2.5 cm focus of soft tissue in the right axilla with SUV 2.2.  Precarinal lymph node, short axis IX millimeters with SUV 2.5.  12 mm short axis subcarinal lymph node with SUV 3. -CT scan dated on 02/22/2019 showed stable findings.  Borderline mediastinal lymph nodes present. -3 cycles of chemotherapy with carboplatin and VP-16 from 03/09/2019 through 04/20/2019. -She had some nausea but denied any vomiting.  We reviewed her labs which are grossly within normal limits. -She will proceed with her cycle 4 today.  I plan to repeat CT scan in 4 weeks.  We will also obtain MRI of the brain. -We also discussed surveillance plan with imaging every 3 months for the first 2 years.  We will also consider imaging of her brain.  2.  Macrocytic anemia: -She received 1 unit of PRBC on 03/18/2019. -Hemoglobin today is 8.6.  This is from myelosuppression from chemotherapy.  Does not require any transfusion.  3.  Weight loss: -She lost about 1 pound since last chemotherapy. -We will consider Marinol if weight drops below 105.  She was told to drink Ensure daily.  4.  Essential thrombocytosis: -Hydrea is on hold since we started chemotherapy on 03/02/2019. -We will consider restarting it after completion of  chemotherapy.  5.  Peripheral neuropathy: -She is taking gabapentin 600 mg at bedtime for burning pain in the legs at nighttime.  6.  Epigastric pain: -She is taking Protonix twice daily. -She is also using Mylanta 3-4 times a day which is helping.

## 2019-05-11 NOTE — Patient Instructions (Addendum)
[place] at [facility] Discharge Instructions  You were seen today by Dr. Delton Coombes. He went over your recent lab results. He will schedule you for a repeat scan prior to your next visit. He will see you back in 3 weeks for labs, treatment and follow up.   Thank you for choosing [place] at [facility] to provide your oncology and hematology care.  To afford each patient quality time with our provider, please arrive at least 15 minutes before your scheduled appointment time.   If you have a lab appointment with the [place] please come in thru the  Main Entrance and check in at the main information desk  You need to re-schedule your appointment should you arrive 10 or more minutes late.  We strive to give you quality time with our providers, and arriving late affects you and other patients whose appointments are after yours.  Also, if you no show three or more times for appointments you may be dismissed from the clinic at the providers discretion.     Again, thank you for choosing [facility].  Our hope is that these requests will decrease the amount of time that you wait before being seen by our physicians.       _____________________________________________________________  Should you have questions after your visit to [facility], please contact our office at [phone] between the hours of 8:00 a.m. and 4:30 p.m.  Voicemails left after 4:00 p.m. will not be returned until the following business day.  For prescription refill requests, have your pharmacy contact our office and allow 72 hours.    Cancer Center Support Programs:   > Cancer Support Group  2nd Tuesday of the month 1pm-2pm, Journey Room

## 2019-05-11 NOTE — Progress Notes (Signed)
Ashley [name], [place] [address]   CLINIC:  Medical Oncology/Hematology  PCP:  Ashley Rival, NP [address] [place] [address] [phone]   REASON FOR VISIT: Small cell lung cancer, essential thrombocytosis.  CURRENT THERAPY:Carboplatin and VP-16.    BRIEF ONCOLOGIC HISTORY:  Oncology History  Adenocarcinoma of left breast ([place])  09/29/1993 - 05/14/1994 Chemotherapy    AC Q 3 weeks X 6 cycles   01/02/1994 Surgery   Left modified radical mastectomy   01/02/1994 Pathology Results   ER-, PR - with a single positive LN found in the L axillae, Stage II disease   07/22/2015 Imaging   Bone Scan, No metastatic pattern uptake, degenerative changes in the lumbar spine with dextroscoliosis   05/25/2016 PET scan   No findings of active malignancy in the neck, chest, abdomen, or pelvis. The 3 liver lesions are not hypermetabolic. The radiologist suspects they may be subtly present on prior CT chest from 10/2013, to further reassuring that these are likely benign lesions.   Melanoma ([facility])  07/09/2013 Initial Biopsy   Initial biopsy L upper thigh/buttocks melanoma   07/20/2013 Surgery   Excision L lateral buttock/upper thigh melanoma, clear margins   07/20/2013 Pathology Results   B[place]low depth 1.02 mm, pT2a    Small cell lung carcinoma, right ([place])  11/27/2018 Initial Diagnosis   Small cell lung carcinoma, right ([facility])   03/09/2019 -  Chemotherapy   The patient had palonosetron (ALOXI) injection 0.25 mg, 0.25 mg, Intravenous,  Once, 4 of 4 cycles Administration: 0.25 mg (03/09/2019), 0.25 mg (03/30/2019), 0.25 mg (04/20/2019) pegfilgrastim (NEULASTA ONPRO KIT) injection 6 mg, 6 mg, Subcutaneous, Once, 1 of 1 cycle Administration: 6 mg (03/11/2019) pegfilgrastim-jmdb (FULPHILA) injection 6 mg, 6 mg, Subcutaneous,  Once, 3 of 3 cycles Administration: 6 mg (04/03/2019), 6 mg (04/24/2019) CARBOplatin (PARAPLATIN) 330 mg in sodium chloride 0.9 % 250  mL chemo infusion, 330 mg (100 % of original dose 325.5 mg), Intravenous,  Once, 4 of 4 cycles Dose modification:   (original dose 325.5 mg, Cycle 1),   (original dose 325.5 mg, Cycle 2),   (original dose 309 mg, Cycle 3),   (original dose 325.5 mg, Cycle 4) Administration: 330 mg (03/09/2019), 330 mg (03/30/2019), 310 mg (04/20/2019) etoposide (VEPESID) 150 mg in sodium chloride 0.9 % 500 mL chemo infusion, 100 mg/m2 = 150 mg, Intravenous,  Once, 4 of 4 cycles Administration: 150 mg (03/09/2019), 150 mg (03/10/2019), 150 mg (03/11/2019), 150 mg (03/30/2019), 150 mg (03/31/2019), 150 mg (04/01/2019), 150 mg (04/20/2019), 150 mg (04/21/2019), 150 mg (04/22/2019) fosaprepitant (EMEND) 150 mg, dexamethasone (DECADRON) 12 mg in sodium chloride 0.9 % 145 mL IVPB, , Intravenous,  Once, 4 of 4 cycles Administration:  (03/09/2019),  (03/30/2019),  (04/20/2019)  for chemotherapy treatment.    03/09/2019 Cancer Staging   Staging form: Lung, AJCC 8th Edition - Clinical: Stage IIIA (cT3, cN1, cM0) - Signed by Ashley Jack, MD on 03/09/2019      INTERVAL HISTORY:  Ashley [name] 69 y.o. female seen for toxicity assessment prior to cycle 4 of chemotherapy.  She received cycle 3 on 04/20/2019.  She reported some nausea but denied any vomiting.  She had baseline constipation which is stable.  Shortness of breath on exertion is also stable.  Numbness in the legs has improved since she started taking gabapentin.  Denies any fevers, night sweats or weight loss.  REVIEW OF SYSTEMS:  Review of Systems  Constitutional: Positive for fatigue.  Respiratory: Positive for shortness of breath.   Gastrointestinal: Positive for constipation and nausea.  Neurological: Positive for numbness.  All other systems reviewed and are negative.    PAST MEDICAL/SURGICAL HISTORY:  Past Medical History:  Diagnosis Date  . Adenocarcinoma of left breast ([facility]) 01/09/2016  . Anginal pain ([facility])   . Arthritis   . Ascending aortic aneurysm  ([facility])   . Cancer [facility]) 1995   breast, left, mastectomy/chemo  . Chest pain    Possibly cardiac. No evidence of ischemia/injury based upon normal troponin I. Chest discomfort could be tachycardia induced supply demand mismatch.   . CHF (congestive heart failure) ([facility]) 11/17/2015   after surgery   . Colon adenomas   . Coronary artery disease   . DJD (degenerative joint disease)   . Dyspnea    with exertion  . Emphysema of lung ([facility]) 11/17/2015  . Essential hypertension, benign   . GERD (gastroesophageal reflux disease)   . History of hiatal hernia   . Hyperlipidemia   . Hypertension   . Melanoma ([facility]) 01/09/2016  . Osteopenia   . Palpitations   . Pernicious anemia 03/06/2016  . Pernicious anemia   . Pure hypercholesterolemia   . Raynaud's disease   . Thrombocythemia, essential ([facility]) 01/09/2016  . Thrombocytosis (H[facility])    Idiopathic  . Vitamin D deficiency    Past Surgical History:  Procedure Laterality Date  . ABDOMINAL HYSTERECTOMY    . ANTERIOR AND POSTERIOR REPAIR N/A 12/09/2014   Procedure: ANTERIOR (CYSTOCELE) AND POSTERIOR REPAIR (RECTOCELE);  Surgeon: Ashley Loser, MD;  Location: [facility];  Service: Urology;  Laterality: N/A;  . ANTERIOR CERVICAL DECOMPRESSION/DISCECTOMY FUSION 4 LEVELS Right 10/03/2016   Procedure: ANTERIOR CERVICAL DECOMPRESSION FUSION, CERVICAL 4-5, CERVICAL 5-6, CERVICAL 6-7, CERVICAL 7 TO THORACIC 1 WITH INSTRUMENTATION AND ALLOGRAFT;  Surgeon: Ashley Bob, MD;  Location: [facility];  Service: Orthopedics;  Laterality: Right;  ANTERIOR CERVICAL DECOMPRESSION FUSION, CERVICAL 4-5, CERVICAL 5-6, CERVICAL 6-7, CERVICAL 7 TO THORACIC 1 WITH INSTRUMENTATION AND ALLOGRAFT; REQUEST 4 HO  . ANTERIOR LAT LUMBAR FUSION Left 11/16/2015   Procedure: LEFT SIDED LATERAL INTERBODY FUSION, LUMBAR 2-3, LUMBAR 3-4, LUMBAR 4-5 WITH INSTRUMENTATION;  Surgeon: Ashley Bob, MD;  Location: [facility];  Service: Orthopedics;  Laterality: Left;  LEFT SIDED LATEARL INTERBODY FUSION, LUMBAR  2-3, LUMBAR 3-4, LUMBAR 4-5 WITH INSTRUMENTATION   . APPENDECTOMY    . BACK SURGERY    . BONE MARROW ASPIRATION  07/2012  . BONE MARROW BIOPSY  07/2012  . BREAST SURGERY    . CARDIAC CATHETERIZATION    . CARDIAC CATHETERIZATION N/A 01/20/2016   Procedure: Left Heart Cath and Coronary Angiography;  Surgeon: Burnell Blanks, MD;  Location: [facility];  Service: Cardiovascular;  Laterality: N/A;  . COLONOSCOPY  11/29/2010   Procedure: COLONOSCOPY;  Surgeon: Rogene Houston, MD;  [facility];  Service: Endoscopy;  Laterality: N/A;  . COLONOSCOPY N/A 02/18/2014   Procedure: COLONOSCOPY;  Surgeon: Rogene Houston, MD;  [facility];  Service: Endoscopy;  Laterality: N/A;  1030  . COLONOSCOPY N/A 02/25/2017   Procedure: COLONOSCOPY;  Surgeon: Rogene Houston, MD;  [facility];  Service: Endoscopy;  Laterality: N/A;  10:55  . CYSTOSCOPY N/A 12/09/2014   Procedure: CYSTOSCOPY;  Surgeon: Ashley Loser, MD;  Location: [facility];  Service: Urology;  Laterality: N/A;  . ESOPHAGEAL DILATION N/A 02/25/2017   Procedure: ESOPHAGEAL DILATION;  Surgeon: Rogene Houston, MD;  [facility];  Service: Endoscopy;  Laterality: N/A;  . ESOPHAGOGASTRODUODENOSCOPY N/A 02/25/2017   Procedure: ESOPHAGOGASTRODUODENOSCOPY (EGD);  Surgeon: Rogene Houston, MD;  [facility];  Service: Endoscopy;  Laterality: N/A;  . IR GENERIC HISTORICAL  01/11/2016   IR RADIOLOGY PERIPHERAL GUIDED IV START 01/11/2016 Saverio Danker, PA-C [facility]  . IR GENERIC HISTORICAL  01/11/2016   IR US GUIDE VASC ACCESS RIGHT 01/11/2016 Saverio Danker, PA-C [facility]  . LYMPH NODE DISSECTION Right 12/30/2018   Procedure: Lymph Node Dissection;  Surgeon: Lajuana Matte, MD;  Location: [facility];  Service: Thoracic;  Laterality: Right;  . MASTECTOMY     left  . OVARIAN CYST SURGERY     x2  . POLYPECTOMY  02/25/2017   Procedure: POLYPECTOMY;  Surgeon: Rogene Houston, MD;   [facility];  Service: Endoscopy;;  . PORTACATH PLACEMENT Left 02/16/2019   Procedure: INSERTION PORT-A-CATH (CATHETER  LEFT SUBCLAVIAN);  Surgeon: Aviva Signs, MD;  Location: [facility];  Service: General;  Laterality: Left;  . SALPINGOOPHORECTOMY Bilateral 12/09/2014   Procedure: SALPINGO OOPHORECTOMY;  Surgeon: Servando Salina, MD;  Location: [facility];  Service: Gynecology;  Laterality: Bilateral;  . TUBAL LIGATION    . VAGINAL HYSTERECTOMY N/A 12/09/2014   Procedure: HYSTERECTOMY VAGINAL;  Surgeon: Servando Salina, MD;  Location: [facility];  Service: Gynecology;  Laterality: N/A;  . VIDEO ASSISTED THORACOSCOPY (VATS)/ LOBECTOMY Right 12/30/2018   Procedure: VIDEO ASSISTED THORACOSCOPY (VATS)/RIGHT LOWER LOBE WEDGE RESECTION, RIGHT UPPER LOBECTOMY;  Surgeon: Lajuana Matte, MD;  Location: [facility];  Service: Thoracic;  Laterality: Right;  Marland Kitchen VIDEO BRONCHOSCOPY N/A 12/30/2018   Procedure: VIDEO BRONCHOSCOPY;  Surgeon: Lajuana Matte, MD;  Location[facility];  Service: Thoracic;  Laterality: N/A;     SOCIAL HISTORY:  Social History   Socioeconomic History  . Marital status: Divorced    Spouse name: Not on file  . Number of children: 2  . Years of education: Not on file  . Highest education level: Not on file  Occupational History  . Not on file  Tobacco Use  . Smoking status: Former Smoker    Packs/day: 0.50    Years: 50.00    Pack years: 25.00    Types: Cigarettes    Quit date: 11/25/2018    Years since quitting: 0.4  . Smokeless tobacco: Never Used  Substance and Sexual Activity  . Alcohol use: Yes    Comment: occasional  . Drug use: No  . Sexual activity: Yes    Birth control/protection: Post-menopausal  Other Topics Concern  . Not on file  Social History Narrative  . Not on file   Social Determinants of Health   Financial Resource Strain:   . Difficulty of Paying Living Expenses: Not on file  Food Insecurity:   . Worried About Charity fundraiser in  the Last Year: Not on file  . Ran Out of Food in the Last Year: Not on file  Transportation Needs:   . Lack of Transportation (Medical): Not on file  . Lack of Transportation (Non-Medical): Not on file  Physical Activity:   . Days of Exercise per Week: Not on file  . Minutes of Exercise per Session: Not on file  Stress:   . Feeling of Stress : Not on file  Social Connections:   . Frequency of Communication with Friends and Family: Not on file  . Frequency of Social Gatherings with Friends and Family: Not on file  . Attends Religious  Services: Not on file  . Active Member of Clubs or Organizations: Not on file  . Attends Archivist Meetings: Not on file  . Marital Status: Not on file  Intimate Partner Violence:   . Fear of Current or Ex-Partner: Not on file  . Emotionally Abused: Not on file  . Physically Abused: Not on file  . Sexually Abused: Not on file    FAMILY HISTORY:  Family History  Problem Relation Age of Onset  . Hypertension Mother   . Heart failure Mother   . Congestive Heart Failure Mother   . COPD Mother   . Pernicious anemia Mother   . Cancer Mother        lung  . Hypertension Father   . CAD Father   . Heart attack Father   . Hypertension Sister   . Cancer Other   . Celiac disease Other     CURRENT MEDICATIONS:  Outpatient Encounter Medications as of 05/11/2019  Medication Sig  . aspirin EC 81 MG tablet Take 81 mg by mouth daily.  . Calcium Carb-Cholecalciferol (CALCIUM 600+D) 600-800 MG-UNIT TABS Take 1 tablet by mouth 2 (two) times daily.  Marland Kitchen CARBOPLATIN IV Inject into the vein every 21 ( twenty-one) days.   . Cholecalciferol (VITAMIN D-3) 1000 units CAPS Take 1,000 Units daily by mouth.   . clonazePAM (KLONOPIN) 0.5 MG tablet TAKE ONE TABLET BY MOUTH AT BEDTIME.  . cyanocobalamin (,VITAMIN B-12,) 1000 MCG/ML injection INJECT 1 ML INTO THE MUSCLE ONCE MONTHLY AS DIRECTED. (Patient taking differently: Inject 1,000 mcg into the muscle every 30  (thirty) days. )  . ETOPOSIDE IV Inject into the vein every 21 ( twenty-one) days.  Marland Kitchen gabapentin (NEURONTIN) 600 MG tablet Take 1 tablet (600 mg total) by mouth daily.  Marland Kitchen ibandronate (BONIVA) 150 MG tablet Take 150 mg by mouth every 30 (thirty) days.   . pantoprazole (PROTONIX) 40 MG tablet Take 1 tablet (40 mg total) by mouth 2 (two) times daily.  . rosuvastatin (CRESTOR) 40 MG tablet Take 40 mg at bedtime by mouth.   . [DISCONTINUED] diltiazem (TIAZAC) 240 MG 24 hr capsule Take by mouth.  Marland Kitchen albuterol (PROVENTIL HFA;VENTOLIN HFA) 108 (90 Base) MCG/ACT inhaler Inhale 2 puffs into the lungs every 6 (six) hours as needed for wheezing or shortness of breath. (Patient not taking: Reported on 05/11/2019)  . guaiFENesin (MUCINEX) 600 MG 12 hr tablet Take 1 tablet (600 mg total) by mouth 2 (two) times daily as needed for cough or to loosen phlegm. (Patient not taking: Reported on 05/11/2019)  . HYDROcodone-acetaminophen (NORCO/VICODIN) 5-325 MG tablet Take 1 tablet by mouth every 8 (eight) hours as needed for moderate pain. (Patient not taking: Reported on 05/11/2019)  . ibuprofen (ADVIL,MOTRIN) 800 MG tablet Take 800 mg every 8 (eight) hours as needed by mouth for moderate pain.  Marland Kitchen ipratropium-albuterol (DUONEB) 0.5-2.5 (3) MG/3ML SOLN Take 3 mLs by nebulization every 6 (six) hours as needed (shortness of breath). (Patient not taking: Reported on 05/11/2019)  . lidocaine-prilocaine (EMLA) cream Apply a small amount to port a cath site and cover with plastic wrap 1 hour prior to chemotherapy appointments (Patient not taking: Reported on 05/11/2019)  . methocarbamol (ROBAXIN) 500 MG tablet Take 500 mg by mouth every 6 (six) hours as needed for muscle spasms.   . ondansetron (ZOFRAN) 8 MG tablet Take 1 tablet (8 mg total) by mouth every 8 (eight) hours as needed for nausea or vomiting. (Patient not taking: Reported on 05/11/2019)  .  OXYGEN Inhale 3 L into the lungs daily.  . polyethylene glycol (MIRALAX / GLYCOLAX)  17 g packet Take 17 g by mouth daily as needed for mild constipation.  . prochlorperazine (COMPAZINE) 10 MG tablet Take 1 tablet (10 mg total) by mouth every 6 (six) hours as needed (Nausea or vomiting). (Patient not taking: Reported on 05/11/2019)  . traMADol (ULTRAM) 50 MG tablet Take 50 mg by mouth every 6 (six) hours as needed.   No facility-administered encounter medications on file as of 05/11/2019.    ALLERGIES:  Allergies  Allergen Reactions  . Penicillins Hives and Rash    Did it involve swelling of the face/tongue/throat, SOB, or low BP? No Did it involve sudden or severe rash/hives, skin peeling, or any reaction on the inside of your mouth or nose? No Did you need to seek medical attention at a hospital or doctor's office? No When did it last happen?5-10 year If all above answers are "NO", may proceed with cephalosporin use.    . Tape Rash    Medipore, Coban, and paper tape CAN be tolerated     PHYSICAL EXAM:  ECOG Performance status: 1  Vitals:   05/11/19 0942  BP: 113/64  Pulse: 79  Resp: 16  Temp: (!) 97.1 F (36.2 C)  SpO2: 98%   Filed Weights   05/11/19 0942  Weight: 106 lb 12.8 oz (48.4 kg)    Physical Exam Constitutional:      Appearance: Normal appearance. She is normal weight.  Cardiovascular:     Rate and Rhythm: Normal rate and regular rhythm.     Heart sounds: Normal heart sounds.  Pulmonary:     Effort: Pulmonary effort is normal.     Breath sounds: Normal breath sounds.  Abdominal:     General: Abdomen is flat. There is no distension.     Palpations: Abdomen is soft. There is no mass.  Musculoskeletal:        General: Normal range of motion.  Skin:    General: Skin is warm and dry.  Neurological:     Mental Status: She is alert and oriented to person, place, and time. Mental status is at baseline.  Psychiatric:        Mood and Affect: Mood normal.        Behavior: Behavior normal.        Thought Content: Thought content normal.         Judgment: Judgment normal.      LABORATORY DATA:  I have reviewed the labs as listed.  CBC    Component Value Date/Time   WBC 12.0 (H) 05/11/2019 0927   RBC 2.72 (L) 05/11/2019 0927   HGB 8.6 (L) 05/11/2019 0927   HCT 28.5 (L) 05/11/2019 0927   PLT 395 05/11/2019 0927   MCV 104.8 (H) 05/11/2019 0927   MCH 31.6 05/11/2019 0927   MCHC 30.2 05/11/2019 0927   RDW 18.6 (H) 05/11/2019 0927   LYMPHSABS 1.0 05/11/2019 0927   MONOABS 1.1 (H) 05/11/2019 0927   EOSABS 0.1 05/11/2019 0927   BASOSABS 0.1 05/11/2019 0927   CMP Latest Ref Rng & Units 05/11/2019 04/20/2019 03/30/2019  Glucose 70 - 99 mg/dL 112(H) 101(H) 98  BUN 8 - 23 mg/dL '17 15 18  ' Creatinine 0.44 - 1.00 mg/dL 0.97 1.09(H) 0.93  Sodium 135 - 145 mmol/L 135 138 140  Potassium 3.5 - 5.1 mmol/L 3.8 3.8 3.5  Chloride 98 - 111 mmol/L 102 103 107  CO2 22 - 32  mmol/L '24 27 25  ' Calcium 8.9 - 10.3 mg/dL 8.8(L) 9.1 9.3  Total Protein 6.5 - 8.1 g/dL 6.4(L) 6.3(L) 6.6  Total Bilirubin 0.3 - 1.2 mg/dL 0.6 0.4 0.4  Alkaline Phos 38 - 126 U/L 95 90 75  AST 15 - 41 U/L '17 17 15  ' ALT 0 - 44 U/L '11 10 9       ' DIAGNOSTIC IMAGING:  I have independently reviewed the scans and discussed with the patient.     ASSESSMENT & PLAN:   Small cell lung carcinoma, right ([facility]CC) 1.  Stage III (T3N1) small cell lung cancer: -Right upper lobectomy on 12/30/2018 shows invasive small cell carcinoma, 4.2 cm, involving visceral pleura, 1 out of 3 lymph nodes positive, lymphovascular invasion positive, metastatic carcinoma in 2, 11 R lymph nodes.  Right chest wall biopsy consistent with small cell carcinoma. -30 Gy of radiation in 10 fractions from 01/28/2019 through 02/10/2019. -PET scan on 01/26/2019 shows dense consolidative airspace disease in the posterior right base, markedly hypermetabolic SUV 8.  Loculated anterior pleural fluid in the right hemithorax with adjacent airspace consolidation is hypermetabolic with SUV 4.1.  Hypermetabolic  uptake identified along the right lung staple lines.  2.5 cm focus of soft tissue in the right axilla with SUV 2.2.  Precarinal lymph node, short axis IX millimeters with SUV 2.5.  12 mm short axis subcarinal lymph node with SUV 3. -CT scan dated on 02/22/2019 showed stable findings.  Borderline mediastinal lymph nodes present. -3 cycles of chemotherapy with carboplatin and VP-16 from 03/09/2019 through 04/20/2019. -She had some nausea but denied any vomiting.  We reviewed her labs which are grossly within normal limits. -She will proceed with her cycle 4 today.  I plan to repeat CT scan in 4 weeks.  We will also obtain MRI of the brain. -We also discussed surveillance plan with imaging every 3 months for the first 2 years.  We will also consider imaging of her brain.  2.  Macrocytic anemia: -She received 1 unit of PRBC on 03/18/2019. -Hemoglobin today is 8.6.  This is from myelosuppression from chemotherapy.  Does not require any transfusion.  3.  Weight loss: -She lost about 1 pound since last chemotherapy. -We will consider Marinol if weight drops below 105.  She was told to drink Ensure daily.  4.  Essential thrombocytosis: -Hydrea is on hold since we started chemotherapy on 03/02/2019. -We will consider restarting it after completion of chemotherapy.  5.  Peripheral neuropathy: -She is taking gabapentin 600 mg at bedtime for burning pain in the legs at nighttime.  6.  Epigastric pain: -She is taking Protonix twice daily. -She is also using Mylanta 3-4 times a day which is helping.   Orders placed this encounter:  Orders Placed This Encounter  Procedures  . CT Chest W Contrast  . MR Brain W Wo Contrast  . CBC with Differential/Platelet  . Comprehensive metabolic panel  . Magnesium      Ashley Jack, MD [place] [phone]

## 2019-05-12 ENCOUNTER — Inpatient Hospital Stay (HOSPITAL_COMMUNITY): Payer: Medicare Other

## 2019-05-12 VITALS — BP 127/70 | HR 70 | Temp 96.8°F | Resp 16

## 2019-05-12 DIAGNOSIS — Z5111 Encounter for antineoplastic chemotherapy: Secondary | ICD-10-CM | POA: Diagnosis not present

## 2019-05-12 DIAGNOSIS — C3491 Malignant neoplasm of unspecified part of right bronchus or lung: Secondary | ICD-10-CM

## 2019-05-12 MED ORDER — SODIUM CHLORIDE 0.9 % IV SOLN
100.0000 mg/m2 | Freq: Once | INTRAVENOUS | Status: AC
  Administered 2019-05-12: 150 mg via INTRAVENOUS
  Filled 2019-05-12: qty 7.5

## 2019-05-12 MED ORDER — SODIUM CHLORIDE 0.9% FLUSH
10.0000 mL | INTRAVENOUS | Status: DC | PRN
  Administered 2019-05-12: 10 mL

## 2019-05-12 MED ORDER — HEPARIN SOD (PORK) LOCK FLUSH 100 UNIT/ML IV SOLN
500.0000 [IU] | Freq: Once | INTRAVENOUS | Status: AC | PRN
  Administered 2019-05-12: 500 [IU]

## 2019-05-12 MED ORDER — SODIUM CHLORIDE 0.9 % IV SOLN: Freq: Once | INTRAVENOUS | Status: AC

## 2019-05-12 MED ORDER — SODIUM CHLORIDE 0.9 % IV SOLN
10.0000 mg | Freq: Once | INTRAVENOUS | Status: AC
  Administered 2019-05-12: 10 mg via INTRAVENOUS
  Filled 2019-05-12: qty 10

## 2019-05-12 NOTE — Patient Instructions (Signed)
[facility] Discharge Instructions for Patients Receiving Chemotherapy  Today you received the following chemotherapy agents   To help prevent nausea and vomiting after your treatment, we encourage you to take your nausea medication   If you develop nausea and vomiting that is not controlled by your nausea medication, call the clinic.   BELOW ARE SYMPTOMS THAT SHOULD BE REPORTED IMMEDIATELY:  *FEVER GREATER THAN 100.5 F  *CHILLS WITH OR WITHOUT FEVER  NAUSEA AND VOMITING THAT IS NOT CONTROLLED WITH YOUR NAUSEA MEDICATION  *UNUSUAL SHORTNESS OF BREATH  *UNUSUAL BRUISING OR BLEEDING  TENDERNESS IN MOUTH AND THROAT WITH OR WITHOUT PRESENCE OF ULCERS  *URINARY PROBLEMS  *BOWEL PROBLEMS  UNUSUAL RASH Items with * indicate a potential emergency and should be followed up as soon as possible.  Feel free to call the clinic should you have any questions or concerns. The clinic phone number is [phone].  Please show the CHEMO ALERT CARD at check-in to the Emergency Department and triage nurse.   

## 2019-05-12 NOTE — Progress Notes (Signed)
Treatment given per orders. Patient tolerated it well without problems. Vitals stable and discharged home from clinic via wheelchair Follow up as scheduled.  

## 2019-05-13 ENCOUNTER — Inpatient Hospital Stay (HOSPITAL_COMMUNITY): Payer: Medicare Other

## 2019-05-13 ENCOUNTER — Other Ambulatory Visit: Payer: Self-pay

## 2019-05-13 ENCOUNTER — Other Ambulatory Visit (HOSPITAL_COMMUNITY): Payer: Self-pay | Admitting: *Deleted

## 2019-05-13 VITALS — BP 126/63 | HR 73 | Temp 97.1°F | Resp 18

## 2019-05-13 DIAGNOSIS — Z5111 Encounter for antineoplastic chemotherapy: Secondary | ICD-10-CM | POA: Diagnosis not present

## 2019-05-13 DIAGNOSIS — C3491 Malignant neoplasm of unspecified part of right bronchus or lung: Secondary | ICD-10-CM

## 2019-05-13 MED ORDER — SODIUM CHLORIDE 0.9 % IV SOLN: Freq: Once | INTRAVENOUS | Status: AC

## 2019-05-13 MED ORDER — HYDROCODONE-ACETAMINOPHEN 5-325 MG PO TABS: 1.0000 | ORAL_TABLET | Freq: Three times a day (TID) | ORAL | 0 refills | Status: DC | PRN

## 2019-05-13 MED ORDER — ONDANSETRON 8 MG PO TBDP: 8.0000 mg | ORAL_TABLET | Freq: Three times a day (TID) | ORAL | 2 refills | Status: DC | PRN

## 2019-05-13 MED ORDER — SODIUM CHLORIDE 0.9 % IV SOLN
10.0000 mg | Freq: Once | INTRAVENOUS | Status: AC
  Administered 2019-05-13: 11:00:00 10 mg via INTRAVENOUS
  Filled 2019-05-13: qty 10

## 2019-05-13 MED ORDER — SODIUM CHLORIDE 0.9% FLUSH
10.0000 mL | INTRAVENOUS | Status: DC | PRN
  Administered 2019-05-13: 10 mL

## 2019-05-13 MED ORDER — SODIUM CHLORIDE 0.9 % IV SOLN
100.0000 mg/m2 | Freq: Once | INTRAVENOUS | Status: AC
  Administered 2019-05-13: 12:00:00 150 mg via INTRAVENOUS
  Filled 2019-05-13: qty 7.5

## 2019-05-13 MED ORDER — HEPARIN SOD (PORK) LOCK FLUSH 100 UNIT/ML IV SOLN
500.0000 [IU] | Freq: Once | INTRAVENOUS | Status: AC | PRN
  Administered 2019-05-13: 500 [IU]

## 2019-05-13 NOTE — Patient Instructions (Signed)
[facility] Discharge Instructions for Patients Receiving Chemotherapy  Today you received the following chemotherapy agents   To help prevent nausea and vomiting after your treatment, we encourage you to take your nausea medication   If you develop nausea and vomiting that is not controlled by your nausea medication, call the clinic.   BELOW ARE SYMPTOMS THAT SHOULD BE REPORTED IMMEDIATELY:  *FEVER GREATER THAN 100.5 F  *CHILLS WITH OR WITHOUT FEVER  NAUSEA AND VOMITING THAT IS NOT CONTROLLED WITH YOUR NAUSEA MEDICATION  *UNUSUAL SHORTNESS OF BREATH  *UNUSUAL BRUISING OR BLEEDING  TENDERNESS IN MOUTH AND THROAT WITH OR WITHOUT PRESENCE OF ULCERS  *URINARY PROBLEMS  *BOWEL PROBLEMS  UNUSUAL RASH Items with * indicate a potential emergency and should be followed up as soon as possible.  Feel free to call the clinic should you have any questions or concerns. The clinic phone number is [phone].  Please show the CHEMO ALERT CARD at check-in to the Emergency Department and triage nurse.   

## 2019-05-13 NOTE — Progress Notes (Signed)
No new concerns today. Proceed as planned.   Treatment given per orders. Patient tolerated it well without problems. Vitals stable and discharged home from clinic via wheelchair. Follow up as scheduled.

## 2019-05-15 ENCOUNTER — Other Ambulatory Visit: Payer: Self-pay

## 2019-05-15 ENCOUNTER — Inpatient Hospital Stay (HOSPITAL_COMMUNITY): Payer: Medicare Other

## 2019-05-15 VITALS — BP 111/71 | HR 80 | Temp 97.1°F | Resp 16

## 2019-05-15 DIAGNOSIS — Z5111 Encounter for antineoplastic chemotherapy: Secondary | ICD-10-CM | POA: Diagnosis not present

## 2019-05-15 DIAGNOSIS — C3491 Malignant neoplasm of unspecified part of right bronchus or lung: Secondary | ICD-10-CM

## 2019-05-15 MED ORDER — PEGFILGRASTIM-JMDB 6 MG/0.6ML ~~LOC~~ SOSY
6.0000 mg | PREFILLED_SYRINGE | Freq: Once | SUBCUTANEOUS | Status: AC
  Administered 2019-05-15: 6 mg via SUBCUTANEOUS

## 2019-05-15 NOTE — Progress Notes (Signed)
Ashley [name] presents today for injection per MD orders. Fulphila administered SQ in right Upper Arm. Administration without incident. Patient tolerated well.  No complaints at this time. Discharged from clinic via wheel chair. F/U with [facility] as scheduled.

## 2019-05-27 ENCOUNTER — Other Ambulatory Visit: Payer: Self-pay

## 2019-05-27 ENCOUNTER — Inpatient Hospital Stay (HOSPITAL_COMMUNITY): Payer: Medicare Other | Attending: Hematology

## 2019-05-27 ENCOUNTER — Ambulatory Visit (HOSPITAL_COMMUNITY)
Admission: RE | Admit: 2019-05-27 | Discharge: 2019-05-27 | Disposition: A | Discharge status: Home / Self Care | Payer: Medicare Other | Source: Ambulatory Visit | Attending: Hematology | Admitting: Hematology

## 2019-05-27 DIAGNOSIS — Z9079 Acquired absence of other genital organ(s): Secondary | ICD-10-CM | POA: Diagnosis not present

## 2019-05-27 DIAGNOSIS — Z90722 Acquired absence of ovaries, bilateral: Secondary | ICD-10-CM | POA: Diagnosis not present

## 2019-05-27 DIAGNOSIS — Z79899 Other long term (current) drug therapy: Secondary | ICD-10-CM | POA: Insufficient documentation

## 2019-05-27 DIAGNOSIS — Z801 Family history of malignant neoplasm of trachea, bronchus and lung: Secondary | ICD-10-CM | POA: Insufficient documentation

## 2019-05-27 DIAGNOSIS — C3491 Malignant neoplasm of unspecified part of right bronchus or lung: Secondary | ICD-10-CM | POA: Diagnosis present

## 2019-05-27 DIAGNOSIS — Z808 Family history of malignant neoplasm of other organs or systems: Secondary | ICD-10-CM | POA: Diagnosis not present

## 2019-05-27 DIAGNOSIS — D473 Essential (hemorrhagic) thrombocythemia: Secondary | ICD-10-CM | POA: Diagnosis not present

## 2019-05-27 DIAGNOSIS — C3411 Malignant neoplasm of upper lobe, right bronchus or lung: Secondary | ICD-10-CM | POA: Diagnosis present

## 2019-05-27 DIAGNOSIS — Z9012 Acquired absence of left breast and nipple: Secondary | ICD-10-CM | POA: Insufficient documentation

## 2019-05-27 DIAGNOSIS — Z7982 Long term (current) use of aspirin: Secondary | ICD-10-CM | POA: Insufficient documentation

## 2019-05-27 DIAGNOSIS — Z853 Personal history of malignant neoplasm of breast: Secondary | ICD-10-CM | POA: Insufficient documentation

## 2019-05-27 DIAGNOSIS — I10 Essential (primary) hypertension: Secondary | ICD-10-CM | POA: Diagnosis not present

## 2019-05-27 DIAGNOSIS — Z8249 Family history of ischemic heart disease and other diseases of the circulatory system: Secondary | ICD-10-CM | POA: Insufficient documentation

## 2019-05-27 DIAGNOSIS — M8580 Other specified disorders of bone density and structure, unspecified site: Secondary | ICD-10-CM | POA: Insufficient documentation

## 2019-05-27 DIAGNOSIS — D539 Nutritional anemia, unspecified: Secondary | ICD-10-CM | POA: Insufficient documentation

## 2019-05-27 DIAGNOSIS — Z85820 Personal history of malignant melanoma of skin: Secondary | ICD-10-CM | POA: Diagnosis not present

## 2019-05-27 DIAGNOSIS — Z452 Encounter for adjustment and management of vascular access device: Secondary | ICD-10-CM | POA: Insufficient documentation

## 2019-05-27 DIAGNOSIS — Z90710 Acquired absence of both cervix and uterus: Secondary | ICD-10-CM | POA: Diagnosis not present

## 2019-05-27 DIAGNOSIS — G629 Polyneuropathy, unspecified: Secondary | ICD-10-CM | POA: Insufficient documentation

## 2019-05-27 DIAGNOSIS — Z87891 Personal history of nicotine dependence: Secondary | ICD-10-CM | POA: Diagnosis not present

## 2019-05-27 MED ORDER — SODIUM CHLORIDE 0.9% FLUSH
10.0000 mL | Freq: Once | INTRAVENOUS | Status: AC
  Administered 2019-05-27: 09:00:00 10 mL via INTRAVENOUS

## 2019-05-27 MED ORDER — IOHEXOL 300 MG/ML  SOLN
75.0000 mL | Freq: Once | INTRAMUSCULAR | Status: AC | PRN
  Administered 2019-05-27: 09:00:00 75 mL via INTRAVENOUS

## 2019-05-27 MED ORDER — HEPARIN SOD (PORK) LOCK FLUSH 100 UNIT/ML IV SOLN
INTRAVENOUS | Status: AC
  Filled 2019-05-27: qty 5

## 2019-05-28 ENCOUNTER — Ambulatory Visit
Admission: RE | Admit: 2019-05-28 | Discharge: 2019-05-28 | Disposition: A | Discharge status: Home / Self Care | Payer: Medicare Other | Source: Ambulatory Visit | Attending: Hematology | Admitting: Hematology

## 2019-05-28 ENCOUNTER — Encounter (HOSPITAL_COMMUNITY): Payer: Self-pay

## 2019-05-28 ENCOUNTER — Inpatient Hospital Stay (HOSPITAL_COMMUNITY): Payer: Medicare Other

## 2019-05-28 DIAGNOSIS — Z452 Encounter for adjustment and management of vascular access device: Secondary | ICD-10-CM | POA: Diagnosis not present

## 2019-05-28 DIAGNOSIS — C3491 Malignant neoplasm of unspecified part of right bronchus or lung: Secondary | ICD-10-CM

## 2019-05-28 MED ORDER — HEPARIN SOD (PORK) LOCK FLUSH 100 UNIT/ML IV SOLN
500.0000 [IU] | Freq: Once | INTRAVENOUS | Status: AC
  Administered 2019-05-28: 500 [IU] via INTRAVENOUS

## 2019-05-28 MED ORDER — GADOBUTROL 1 MMOL/ML IV SOLN
10.0000 mL | Freq: Once | INTRAVENOUS | Status: AC | PRN
  Administered 2019-05-28: 10 mL via INTRAVENOUS

## 2019-05-28 MED ORDER — SODIUM CHLORIDE 0.9% FLUSH
10.0000 mL | INTRAVENOUS | Status: DC | PRN
  Administered 2019-05-28: 10 mL via INTRAVENOUS

## 2019-05-28 NOTE — Progress Notes (Signed)
Ashley [name] tolerated portacath flush well without complaints or incident. Port flushed easily per protocol then de-accessed after scans completed at [facility] today. Pt discharged self ambulatory in satisfactory condition accompanied by her daughter

## 2019-05-28 NOTE — Patient Instructions (Signed)
[place] at [facility] Discharge Instructions  Portacath flushed per protocol and needle removed today. Follow-up as scheduled. Call clinic for any questions or concerns   Thank you for choosing [place] at [facility] to provide your oncology and hematology care.  To afford each patient quality time with our provider, please arrive at least 15 minutes before your scheduled appointment time.   If you have a lab appointment with the [facility] please come in thru the Main Entrance and check in at the main information desk.  You need to re-schedule your appointment should you arrive 10 or more minutes late.  We strive to give you quality time with our providers, and arriving late affects you and other patients whose appointments are after yours.  Also, if you no show three or more times for appointments you may be dismissed from the clinic at the providers discretion.     Again, thank you for choosing [facility].  Our hope is that these requests will decrease the amount of time that you wait before being seen by our physicians.       _____________________________________________________________  Should you have questions after your visit to [facility], please contact our office at [phone] between the hours of 8:00 a.m. and 4:30 p.m.  Voicemails left after 4:00 p.m. will not be returned until the following business day.  For prescription refill requests, have your pharmacy contact our office and allow 72 hours.    Due to Covid, you will need to wear a mask upon entering the hospital. If you do not have a mask, a mask will be given to you at the Main Entrance upon arrival. For doctor visits, patients may have 1 support person with them. For treatment visits, patients can not have anyone with them due to social distancing guidelines and our immunocompromised population.

## 2019-05-29 ENCOUNTER — Ambulatory Visit (HOSPITAL_COMMUNITY): Payer: Medicare Other

## 2019-06-01 ENCOUNTER — Encounter (HOSPITAL_COMMUNITY): Payer: Self-pay | Admitting: Hematology

## 2019-06-01 ENCOUNTER — Inpatient Hospital Stay (HOSPITAL_COMMUNITY): Payer: Medicare Other

## 2019-06-01 ENCOUNTER — Other Ambulatory Visit: Payer: Self-pay

## 2019-06-01 ENCOUNTER — Inpatient Hospital Stay (HOSPITAL_BASED_OUTPATIENT_CLINIC_OR_DEPARTMENT_OTHER): Payer: Medicare Other | Admitting: Hematology

## 2019-06-01 DIAGNOSIS — Z452 Encounter for adjustment and management of vascular access device: Secondary | ICD-10-CM | POA: Diagnosis not present

## 2019-06-01 DIAGNOSIS — C3491 Malignant neoplasm of unspecified part of right bronchus or lung: Secondary | ICD-10-CM

## 2019-06-01 LAB — COMPREHENSIVE METABOLIC PANEL
ALT: 10 U/L (ref 0–44)
AST: 17 U/L (ref 15–41)
Albumin: 3.5 g/dL (ref 3.5–5.0)
Alkaline Phosphatase: 98 U/L (ref 38–126)
Anion gap: 9 (ref 5–15)
BUN: 20 mg/dL (ref 8–23)
CO2: 23 mmol/L (ref 22–32)
Calcium: 8.8 mg/dL — ABNORMAL LOW (ref 8.9–10.3)
Chloride: 104 mmol/L (ref 98–111)
Creatinine, Ser: 0.98 mg/dL (ref 0.44–1.00)
GFR calc Af Amer: >60 mL/min (ref >60)
GFR calc non Af Amer: 58 mL/min — ABNORMAL LOW (ref >60)
Glucose, Bld: 105 mg/dL — ABNORMAL HIGH (ref 70–99)
Potassium: 3.6 mmol/L (ref 3.5–5.1)
Sodium: 136 mmol/L (ref 135–145)
Total Bilirubin: 0.5 mg/dL (ref 0.3–1.2)
Total Protein: 7.1 g/dL (ref 6.5–8.1)

## 2019-06-01 LAB — CBC WITH DIFFERENTIAL/PLATELET
Abs Immature Granulocytes: 0.05 10*3/uL (ref 0.00–0.07)
Basophils Absolute: 0.1 10*3/uL (ref 0.0–0.1)
Basophils Relative: 1 %
Eosinophils Absolute: 0.1 10*3/uL (ref 0.0–0.5)
Eosinophils Relative: 1 %
HCT: 30.1 % — ABNORMAL LOW (ref 36.0–46.0)
Hemoglobin: 9.1 g/dL — ABNORMAL LOW (ref 12.0–15.0)
Immature Granulocytes: 0 %
Lymphocytes Relative: 6 %
Lymphs Abs: 0.8 10*3/uL (ref 0.7–4.0)
MCH: 31.3 pg (ref 26.0–34.0)
MCHC: 30.2 g/dL (ref 30.0–36.0)
MCV: 103.4 fL — ABNORMAL HIGH (ref 80.0–100.0)
Monocytes Absolute: 1.1 10*3/uL — ABNORMAL HIGH (ref 0.1–1.0)
Monocytes Relative: 9 %
Neutro Abs: 10.1 10*3/uL — ABNORMAL HIGH (ref 1.7–7.7)
Neutrophils Relative %: 83 %
Platelets: 472 10*3/uL — ABNORMAL HIGH (ref 150–400)
RBC: 2.91 MIL/uL — ABNORMAL LOW (ref 3.87–5.11)
RDW: 17.3 % — ABNORMAL HIGH (ref 11.5–15.5)
WBC: 12.2 10*3/uL — ABNORMAL HIGH (ref 4.0–10.5)
nRBC: 0 % (ref 0.0–0.2)

## 2019-06-01 LAB — MAGNESIUM: Magnesium: 1.9 mg/dL (ref 1.7–2.4)

## 2019-06-01 NOTE — Progress Notes (Signed)
Per [facility] LPN/ Dr. Delton Coombes patient is finished with her treatments. 4 Cycles completed.

## 2019-06-01 NOTE — Progress Notes (Signed)
Patient seen by Dr. Delton Coombes today. Labs reviewed. NO treatment today per [facility] LPN/ Dr. Delton Coombes.

## 2019-06-01 NOTE — Assessment & Plan Note (Addendum)
1.  Stage III (T3N1) small cell lung cancer: -Right upper lobectomy on 12/30/2018, pathology showing 4.2 cm small cell cancer, involving visceral pleura, 1 out of 3 lymph nodes positive, LVI positive, metastatic carcinoma in 2 of 11 R lymph nodes.  Right chest wall biopsy consistent with small cell carcinoma. -30 Gy of radiation in 10 fractions from 01/28/2019 through 02/10/2019. -PET scan on 01/26/2019 shows dense consolidative airspace disease in the posterior right base, markedly hypermetabolic SUV 8.  Loculated anterior pleural fluid in the right hemithorax with adjacent airspace consolidation is hypermetabolic with SUV 4.1.  Hypermetabolic uptake identified along the right lung staple lines.  2.5 cm focus of soft tissue in the right axilla with SUV 2.2.  Precarinal lymph node, short axis 9 millimeters with SUV 2.5.  12 mm short axis subcarinal lymph node with SUV 3. -CT scan on 02/22/2019 showed stable findings.  Borderline mediastinal lymph nodes present. -4 cycles of adjuvant chemotherapy with carboplatin and VP-16 from 03/09/2019 through 05/11/2019. -We reviewed results of the brain MRI which did not show any evidence of metastatic disease done on 05/28/2019. -CT of the chest on 05/27/2019 showed improved right middle lobe consolidation.  1.6 cm possible left hepatic lesion.  We will order MRI of the abdomen with and without contrast. -I will see her back after the MRI.  2.  Weight loss: -Her weight has been stable.  It will likely improve now as she finished her chemotherapy.  3.  Macrocytic anemia: -Last transfusion was on 03/18/2019. -Hemoglobin is between 8 and 9.  This is from myelosuppression from chemotherapy.  4.  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 -We will check her CBC at next visit.  We will continue to hold it at this time.  5.  Peripheral neuropathy: -She will continue gabapentin 600 mg at bedtime.

## 2019-06-01 NOTE — Patient Instructions (Addendum)
[place] at [facility] Discharge Instructions  You were seen today by Dr. Delton Coombes. He went over your recent lab results. He will schedule you for a MRI of your abdomen. He will see you back after your scan for follow up.   Thank you for choosing [place] at [facility] to provide your oncology and hematology care.  To afford each patient quality time with our provider, please arrive at least 15 minutes before your scheduled appointment time.   If you have a lab appointment with the [facility] please come in thru the  Main Entrance and check in at the main information desk  You need to re-schedule your appointment should you arrive 10 or more minutes late.  We strive to give you quality time with our providers, and arriving late affects you and other patients whose appointments are after yours.  Also, if you no show three or more times for appointments you may be dismissed from the clinic at the providers discretion.     Again, thank you for choosing [facility].  Our hope is that these requests will decrease the amount of time that you wait before being seen by our physicians.       _____________________________________________________________  Should you have questions after your visit to [facility], please contact our office at [phone] between the hours of 8:00 a.m. and 4:30 p.m.  Voicemails left after 4:00 p.m. will not be returned until the following business day.  For prescription refill requests, have your pharmacy contact our office and allow 72 hours.    Cancer Center Support Programs:   > Cancer Support Group  2nd Tuesday of the month 1pm-2pm, Journey Room

## 2019-06-01 NOTE — Progress Notes (Signed)
Ashley [name], [place] [address]   CLINIC:  Medical Oncology/Hematology  PCP:  Ashley Rival, NP [address] [place] [address] [phone]   REASON FOR VISIT: Small cell lung cancer, essential thrombocytosis.  CURRENT THERAPY:Carboplatin and VP-16.    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 The 3 liver lesions are not hypermetabolic. The radiologist suspects they may be subtly present on prior CT chest from 10/2013, to further reassuring that these are likely benign lesions.   Melanoma ([place])  07/09/2013 Initial Biopsy   Initial biopsy L upper thigh/buttocks melanoma   07/20/2013 Surgery   Excision L lateral buttock/upper thigh melanoma, clear margins   07/20/2013 Pathology Results   Breslow depth 1.02 mm, pT2a    Small cell lung carcinoma, right ([facility])  11/27/2018 Initial Diagnosis   Small cell lung carcinoma, right ([facility])   03/09/2019 -  Chemotherapy   The patient had palonosetron (ALOXI) injection 0.25 mg, 0.25 mg, Intravenous,  Once, 4 of 4 cycles Administration: 0.25 mg (03/09/2019), 0.25 mg (03/30/2019), 0.25 mg (04/20/2019), 0.25 mg (05/11/2019) pegfilgrastim (NEULASTA ONPRO KIT) injection 6 mg, 6 mg, Subcutaneous, Once, 1 of 1 cycle Administration: 6 mg (03/11/2019) pegfilgrastim-jmdb (FULPHILA) injection 6 mg, 6 mg, Subcutaneous,  Once, 3 of 3 cycles Administration: 6 mg (04/03/2019), 6 mg (04/24/2019), 6 mg (05/15/2019) CARBOplatin  (PARAPLATIN) 330 mg in sodium chloride 0.9 % 250 mL chemo infusion, 330 mg (100 % of original dose 325.5 mg), Intravenous,  Once, 4 of 4 cycles Dose modification:   (original dose 325.5 mg, Cycle 1),   (original dose 325.5 mg, Cycle 2),   (original dose 309 mg, Cycle 3),   (original dose 325.5 mg, Cycle 4) Administration: 330 mg (03/09/2019), 330 mg (03/30/2019), 310 mg (04/20/2019), 330 mg (05/11/2019) etoposide (VEPESID) 150 mg in sodium chloride 0.9 % 500 mL chemo infusion, 100 mg/m2 = 150 mg, Intravenous,  Once, 4 of 4 cycles Administration: 150 mg (03/09/2019), 150 mg (03/10/2019), 150 mg (03/11/2019), 150 mg (03/30/2019), 150 mg (03/31/2019), 150 mg (04/01/2019), 150 mg (04/20/2019), 150 mg (04/21/2019), 150 mg (04/22/2019), 150 mg (05/11/2019), 150 mg (05/12/2019), 150 mg (05/13/2019) fosaprepitant (EMEND) 150 mg, dexamethasone (DECADRON) 12 mg in sodium chloride 0.9 % 145 mL IVPB, , Intravenous,  Once, 4 of 4 cycles Administration:  (03/09/2019),  (03/30/2019),  (04/20/2019),  (05/11/2019)  for chemotherapy treatment.    03/09/2019 Cancer Staging   Staging form: Lung, AJCC 8th Edition - Clinical: Stage IIIA (cT3, cN1, cM0) - Signed by Derek Jack, MD on 03/09/2019      INTERVAL HISTORY:  Ashley [name] 69 y.o. female seen for follow-up of for small cell lung cancer.  She finished her last cycle of chemotherapy.  She had CT scan of the chest and MRI of the brain done.  Reports appetite and energy levels 25%.  Shortness of breath on exertion is gradually improving.  Numbness in the hands and feet has been stable.  Has occasional constipation and nausea.  Mild fatigue is also stable.  REVIEW OF SYSTEMS:  Review of Systems  Constitutional: Positive for fatigue.  Respiratory: Positive for shortness of breath.   Gastrointestinal: Positive for constipation and nausea.  Neurological: Positive for numbness.  All other systems reviewed and are negative.    PAST MEDICAL/SURGICAL HISTORY:  Past Medical  History:  Diagnosis Date  . 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 Anginal pain ([facility])   . Arthritis   . Ascending aortic aneurysm ([place])   . Cancer [facility]) 1995   breast, left, mastectomy/chemo  . Chest pain    Possibly cardiac. No evidence of ischemia/injury based upon normal troponin I. Chest discomfort could be tachycardia induced supply demand mismatch.   . CHF (congestive heart failure) ([place]) 11/17/2015   after surgery   . Colon adenomas   . Coronary artery disease   . DJD (degenerative joint disease)   . Dyspnea    with exertion  . Emphysema of lung ([facility]) 11/17/2015  . Essential hypertension, benign   . GERD (gastroesophageal reflux disease)   . History of hiatal hernia   . Hyperlipidemia   . Hypertension   . Melanoma ([place]) 01/09/2016  . Osteopenia   . Palpitations   . Pernicious anemia 03/06/2016  . Pernicious anemia   . Pure hypercholesterolemia   . Raynaud's disease   . Thrombocythemia, essential ([place]) 01/09/2016  . Thrombocytosis ([facility])    Idiopathic  . Vitamin D deficiency    Past Surgical History:  Procedure Laterality Date  . ABDOMINAL HYSTERECTOMY    . ANTERIOR AND POSTERIOR REPAIR N/A 12/09/2014   Procedure: ANTERIOR (CYSTOCELE) AND POSTERIOR REPAIR (RECTOCELE);  Surgeon: Bjorn Loser, MD;  Location: [facility];  Service: Urology;  Laterality: N/A;  . ANTERIOR CERVICAL DECOMPRESSION/DISCECTOMY FUSION 4 LEVELS Right 10/03/2016   Procedure: ANTERIOR CERVICAL DECOMPRESSION FUSION, CERVICAL 4-5, CERVICAL 5-6, CERVICAL 6-7, CERVICAL 7 TO THORACIC 1 WITH INSTRUMENTATION AND ALLOGRAFT;  Surgeon: Phylliss Bob, MD;  Location: [facility];  Service: Orthopedics;  Laterality: Right;  ANTERIOR CERVICAL DECOMPRESSION FUSION, CERVICAL 4-5, CERVICAL 5-6, CERVICAL 6-7, CERVICAL 7 TO THORACIC 1 WITH INSTRUMENTATION AND ALLOGRAFT; REQUEST 4 HO  . ANTERIOR LAT LUMBAR FUSION Left 11/16/2015   Procedure: LEFT SIDED LATERAL INTERBODY FUSION, LUMBAR 2-3, LUMBAR 3-4, LUMBAR 4-5 WITH  INSTRUMENTATION;  Surgeon: Phylliss Bob, MD;  Location: [facility];  Service: Orthopedics;  Laterality: Left;  LEFT SIDED LATEARL INTERBODY FUSION, LUMBAR 2-3, LUMBAR 3-4, LUMBAR 4-5 WITH INSTRUMENTATION   . APPENDECTOMY    . BACK SURGERY    . BONE MARROW ASPIRATION  07/2012  . BONE MARROW BIOPSY  07/2012  . BREAST SURGERY    . CARDIAC CATHETERIZATION    . CARDIAC CATHETERIZATION N/A 01/20/2016   Procedure: Left Heart Cath and Coronary Angiography;  Surgeon: Burnell Blanks, MD;  Location: [facility];  Service: Cardiovascular;  Laterality: N/A;  . COLONOSCOPY  11/29/2010   Procedure: COLONOSCOPY;  Surgeon: Rogene Houston, MD;  [facility];  Service: Endoscopy;  Laterality: N/A;  . COLONOSCOPY N/A 02/18/2014   Procedure: COLONOSCOPY;  Surgeon: Rogene Houston, MD;  [facility];  Service: Endoscopy;  Laterality: N/A;  1030  . COLONOSCOPY N/A 02/25/2017   Procedure: COLONOSCOPY;  Surgeon: Rogene Houston, MD;  [facility];  Service: Endoscopy;  Laterality: N/A;  10:55  . CYSTOSCOPY N/A 12/09/2014   Procedure: CYSTOSCOPY;  Surgeon: Bjorn Loser, MD;  Location: [facility];  Service:  Urology;  Laterality: N/A;  . ESOPHAGEAL DILATION N/A 02/25/2017   Procedure: ESOPHAGEAL DILATION;  Surgeon: Rogene Houston, MD;  [facility];  Service: Endoscopy;  Laterality: N/A;  . ESOPHAGOGASTRODUODENOSCOPY N/A 02/25/2017   Procedure: ESOPHAGOGASTRODUODENOSCOPY (EGD);  Surgeon: Rogene Houston, MD;  [facility];  Service: Endoscopy;  Laterality: N/A;  . IR GENERIC HISTORICAL  01/11/2016   IR RADIOLOGY PERIPHERAL GUIDED IV START 01/11/2016 Saverio Danker, PA-C [facility]  . IR GENERIC HISTORICAL  01/11/2016   IR US GUIDE VASC ACCESS RIGHT 01/11/2016 Saverio Danker, PA-C [facility]  . LYMPH NODE DISSECTION Right 12/30/2018   Procedure: Lymph Node Dissection;  Surgeon: Lajuana Matte, MD;  Location: [facility];  Service: Thoracic;  Laterality:  Right;  . MASTECTOMY     left  . OVARIAN CYST SURGERY     x2  . POLYPECTOMY  02/25/2017   Procedure: POLYPECTOMY;  Surgeon: Rogene Houston, MD;  [facility];  Service: Endoscopy;;  . PORTACATH PLACEMENT Left 02/16/2019   Procedure: INSERTION PORT-A-CATH (CATHETER  LEFT SUBCLAVIAN);  Surgeon: Aviva Signs, MD;  Location: [facility];  Service: General;  Laterality: Left;  . SALPINGOOPHORECTOMY Bilateral 12/09/2014   Procedure: SALPINGO OOPHORECTOMY;  Surgeon: Servando Salina, MD;  Location: [facility];  Service: Gynecology;  Laterality: Bilateral;  . TUBAL LIGATION    . VAGINAL HYSTERECTOMY N/A 12/09/2014   Procedure: HYSTERECTOMY VAGINAL;  Surgeon: Servando Salina, MD;  Location: [facility];  Service: Gynecology;  Laterality: N/A;  . VIDEO ASSISTED THORACOSCOPY (VATS)/ LOBECTOMY Right 12/30/2018   Procedure: VIDEO ASSISTED THORACOSCOPY (VATS)/RIGHT LOWER LOBE WEDGE RESECTION, RIGHT UPPER LOBECTOMY;  Surgeon: Lajuana Matte, MD;  Location: [facility];  Service: Thoracic;  Laterality: Right;  Marland Kitchen VIDEO BRONCHOSCOPY N/A 12/30/2018   Procedure: VIDEO BRONCHOSCOPY;  Surgeon: Lajuana Matte, MD;  Location[facility];  Service: Thoracic;  Laterality: N/A;     SOCIAL HISTORY:  Social History   Socioeconomic History  . Marital status: Divorced    Spouse name: Not on file  . Number of children: 2  . Years of education: Not on file  . Highest education level: Not on file  Occupational History  . Not on file  Tobacco Use  . Smoking status: Former Smoker    Packs/day: 0.50    Years: 50.00    Pack years: 25.00    Types: Cigarettes    Quit date: 11/25/2018    Years since quitting: 0.5  . Smokeless tobacco: Never Used  Substance and Sexual Activity  . Alcohol use: Yes    Comment: occasional  . Drug use: No  . Sexual activity: Yes    Birth control/protection: Post-menopausal  Other Topics Concern  . Not on file  Social History Narrative  . Not on file   Social Determinants of  Health   Financial Resource Strain:   . Difficulty of Paying Living Expenses: Not on file  Food Insecurity:   . Worried About Charity fundraiser in the Last Year: Not on file  . Ran Out of Food in the Last Year: Not on file  Transportation Needs:   . Lack of Transportation (Medical): Not on file  . Lack of Transportation (Non-Medical): Not on file  Physical Activity:   . Days of Exercise per Week: Not on file  . Minutes of Exercise per Session: Not on file  Stress:   . Feeling of Stress : Not on file  Social Connections:   . Frequency of  Communication with Friends and Family: Not on file  . Frequency of Social Gatherings with Friends and Family: Not on file  . Attends Religious Services: Not on file  . Active Member of Clubs or Organizations: Not on file  . Attends Archivist Meetings: Not on file  . Marital Status: Not on file  Intimate Partner Violence:   . Fear of Current or Ex-Partner: Not on file  . Emotionally Abused: Not on file  . Physically Abused: Not on file  . Sexually Abused: Not on file    FAMILY HISTORY:  Family History  Problem Relation Age of Onset  . Hypertension Mother   . Heart failure Mother   . Congestive Heart Failure Mother   . COPD Mother   . Pernicious anemia Mother   . Cancer Mother        lung  . Hypertension Father   . CAD Father   . Heart attack Father   . Hypertension Sister   . Cancer Other   . Celiac disease Other     CURRENT MEDICATIONS:  Outpatient Encounter Medications as of 06/01/2019  Medication Sig  . aspirin EC 81 MG tablet Take 81 mg by mouth daily.  . Calcium Carb-Cholecalciferol (CALCIUM 600+D) 600-800 MG-UNIT TABS Take 1 tablet by mouth 2 (two) times daily.  Marland Kitchen CARBOPLATIN IV Inject into the vein every 21 ( twenty-one) days.   . Cholecalciferol (VITAMIN D-3) 1000 units CAPS Take 1,000 Units daily by mouth.   . clonazePAM (KLONOPIN) 0.5 MG tablet TAKE ONE TABLET BY MOUTH AT BEDTIME.  . cyanocobalamin (,VITAMIN  B-12,) 1000 MCG/ML injection INJECT 1 ML INTO THE MUSCLE ONCE MONTHLY AS DIRECTED. (Patient taking differently: Inject 1,000 mcg into the muscle every 30 (thirty) days. )  . ETOPOSIDE IV Inject into the vein every 21 ( twenty-one) days.  Marland Kitchen gabapentin (NEURONTIN) 600 MG tablet Take 1 tablet (600 mg total) by mouth daily.  Marland Kitchen ibandronate (BONIVA) 150 MG tablet Take 150 mg by mouth every 30 (thirty) days.   . OXYGEN Inhale 3 L into the lungs daily.  . pantoprazole (PROTONIX) 40 MG tablet Take 1 tablet (40 mg total) by mouth 2 (two) times daily.  . rosuvastatin (CRESTOR) 40 MG tablet Take 40 mg at bedtime by mouth.   Marland Kitchen albuterol (PROVENTIL HFA;VENTOLIN HFA) 108 (90 Base) MCG/ACT inhaler Inhale 2 puffs into the lungs every 6 (six) hours as needed for wheezing or shortness of breath. (Patient not taking: Reported on 05/11/2019)  . guaiFENesin (MUCINEX) 600 MG 12 hr tablet Take 1 tablet (600 mg total) by mouth 2 (two) times daily as needed for cough or to loosen phlegm. (Patient not taking: Reported on 05/11/2019)  . HYDROcodone-acetaminophen (NORCO/VICODIN) 5-325 MG tablet Take 1 tablet by mouth every 8 (eight) hours as needed for moderate pain. (Patient not taking: Reported on 06/01/2019)  . ibuprofen (ADVIL,MOTRIN) 800 MG tablet Take 800 mg every 8 (eight) hours as needed by mouth for moderate pain.  Marland Kitchen ipratropium-albuterol (DUONEB) 0.5-2.5 (3) MG/3ML SOLN Take 3 mLs by nebulization every 6 (six) hours as needed (shortness of breath). (Patient not taking: Reported on 05/11/2019)  . lidocaine-prilocaine (EMLA) cream Apply a small amount to port a cath site and cover with plastic wrap 1 hour prior to chemotherapy appointments (Patient not taking: Reported on 05/11/2019)  . methocarbamol (ROBAXIN) 500 MG tablet Take 500 mg by mouth every 6 (six) hours as needed for muscle spasms.   . ondansetron (ZOFRAN ODT) 8 MG disintegrating tablet  Take 1 tablet (8 mg total) by mouth every 8 (eight) hours as needed for nausea or  vomiting. (Patient not taking: Reported on 06/01/2019)  . ondansetron (ZOFRAN) 8 MG tablet Take 1 tablet (8 mg total) by mouth every 8 (eight) hours as needed for nausea or vomiting. (Patient not taking: Reported on 05/11/2019)  . polyethylene glycol (MIRALAX / GLYCOLAX) 17 g packet Take 17 g by mouth daily as needed for mild constipation.  . prochlorperazine (COMPAZINE) 10 MG tablet Take 1 tablet (10 mg total) by mouth every 6 (six) hours as needed (Nausea or vomiting). (Patient not taking: Reported on 05/11/2019)  . traMADol (ULTRAM) 50 MG tablet Take 50 mg by mouth every 6 (six) hours as needed.   No facility-administered encounter medications on file as of 06/01/2019.    ALLERGIES:  Allergies  Allergen Reactions  . Penicillins Hives and Rash    Did it involve swelling of the face/tongue/throat, SOB, or low BP? No Did it involve sudden or severe rash/hives, skin peeling, or any reaction on the inside of your mouth or nose? No Did you need to seek medical attention at a hospital or doctor's office? No When did it last happen?5-10 year If all above answers are "NO", may proceed with cephalosporin use.    . Tape Rash    Medipore, Coban, and paper tape CAN be tolerated     PHYSICAL EXAM:  ECOG Performance status: 1  Vitals:   06/01/19 0935  BP: (!) 122/92  Pulse: 95  Resp: 18  Temp: (!) 97.1 F (36.2 C)  SpO2: 98%   Filed Weights   06/01/19 0935  Weight: 106 lb 6.4 oz (48.3 kg)    Physical Exam Constitutional:      Appearance: Normal appearance. She is normal weight.  Cardiovascular:     Rate and Rhythm: Normal rate and regular rhythm.     Heart sounds: Normal heart sounds.  Pulmonary:     Effort: Pulmonary effort is normal.     Breath sounds: Normal breath sounds.  Abdominal:     General: Abdomen is flat. There is no distension.     Palpations: Abdomen is soft. There is no mass.  Musculoskeletal:        General: Normal range of motion.  Skin:    General:  Skin is warm and dry.  Neurological:     Mental Status: She is alert and oriented to person, place, and time. Mental status is at baseline.  Psychiatric:        Mood and Affect: Mood normal.        Behavior: Behavior normal.        Thought Content: Thought content normal.        Judgment: Judgment normal.      LABORATORY DATA:  I have reviewed the labs as listed.  CBC    Component Value Date/Time   WBC 12.2 (H) 06/01/2019 1000   RBC 2.91 (L) 06/01/2019 1000   HGB 9.1 (L) 06/01/2019 1000   HCT 30.1 (L) 06/01/2019 1000   PLT 472 (H) 06/01/2019 1000   MCV 103.4 (H) 06/01/2019 1000   MCH 31.3 06/01/2019 1000   MCHC 30.2 06/01/2019 1000   RDW 17.3 (H) 06/01/2019 1000   LYMPHSABS 0.8 06/01/2019 1000   MONOABS 1.1 (H) 06/01/2019 1000   EOSABS 0.1 06/01/2019 1000   BASOSABS 0.1 06/01/2019 1000   CMP Latest Ref Rng & Units 06/01/2019 05/11/2019 04/20/2019  Glucose 70 - 99 mg/dL 105(H) 112(H) 101(H)  BUN 8 - 23 mg/dL '20 17 15  ' Creatinine 0.44 - 1.00 mg/dL 0.98 0.97 1.09(H)  Sodium 135 - 145 mmol/L 136 135 138  Potassium 3.5 - 5.1 mmol/L 3.6 3.8 3.8  Chloride 98 - 111 mmol/L 104 102 103  CO2 22 - 32 mmol/L '23 24 27  ' Calcium 8.9 - 10.3 mg/dL 8.8(L) 8.8(L) 9.1  Total Protein 6.5 - 8.1 g/dL 7.1 6.4(L) 6.3(L)  Total Bilirubin 0.3 - 1.2 mg/dL 0.5 0.6 0.4  Alkaline Phos 38 - 126 U/L 98 95 90  AST 15 - 41 U/L '17 17 17  ' ALT 0 - 44 U/L '10 11 10       ' DIAGNOSTIC IMAGING:  I have independently reviewed the scans and discussed with the patient.     ASSESSMENT & PLAN:   Small cell lung carcinoma, right ([facility]) 1.  Stage III (T3N1) small cell lung cancer: -Right upper lobectomy on 12/30/2018, pathology showing 4.2 cm small cell cancer, involving visceral pleura, 1 out of 3 lymph nodes positive, LVI positive, metastatic carcinoma in 2 of 11 R lymph nodes.  Right chest wall biopsy consistent with small cell carcinoma. -30 Gy of radiation in 10 fractions from 01/28/2019 through  02/10/2019. -PET scan on 01/26/2019 shows dense consolidative airspace disease in the posterior right base, markedly hypermetabolic SUV 8.  Loculated anterior pleural fluid in the right hemithorax with adjacent airspace consolidation is hypermetabolic with SUV 4.1.  Hypermetabolic uptake identified along the right lung staple lines.  2.5 cm focus of soft tissue in the right axilla with SUV 2.2.  Precarinal lymph node, short axis 9 millimeters with SUV 2.5.  12 mm short axis subcarinal lymph node with SUV 3. -CT scan on 02/22/2019 showed stable findings.  Borderline mediastinal lymph nodes present. -4 cycles of adjuvant chemotherapy with carboplatin and VP-16 from 03/09/2019 through 05/11/2019. -We reviewed results of the brain MRI which did not show any evidence of metastatic disease done on 05/28/2019. -CT of the chest on 05/27/2019 showed improved right middle lobe consolidation.  1.6 cm possible left hepatic lesion.  We will order MRI of the abdomen with and without contrast. -I will see her back after the MRI.  2.  Weight loss: -Her weight has been stable.  It will likely improve now as she finished her chemotherapy.  3.  Macrocytic anemia: -Last transfusion was on 03/18/2019. -Hemoglobin is between 8 and 9.  This is from myelosuppression from chemotherapy.  4.  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 -We will check her CBC at next visit.  We will continue to hold it at this time.  5.  Peripheral neuropathy: -She will continue gabapentin 600 mg at bedtime.   Orders placed this encounter:  Orders Placed This Encounter  Procedures  . MR Abdomen [address] [place], [place] [phone]

## 2019-06-02 ENCOUNTER — Ambulatory Visit (HOSPITAL_COMMUNITY): Payer: Medicare Other

## 2019-06-03 ENCOUNTER — Ambulatory Visit (HOSPITAL_COMMUNITY): Payer: Medicare Other

## 2019-06-05 ENCOUNTER — Ambulatory Visit (HOSPITAL_COMMUNITY): Admission: RE | Admit: 2019-06-05 | Payer: Medicare Other | Source: Ambulatory Visit

## 2019-06-05 ENCOUNTER — Ambulatory Visit (HOSPITAL_COMMUNITY): Payer: Medicare Other

## 2019-06-08 ENCOUNTER — Ambulatory Visit (HOSPITAL_COMMUNITY): Payer: Medicare Other | Admitting: Hematology

## 2019-06-15 NOTE — Progress Notes (Signed)
Radiation Oncology         [phone]) [phone] ________________________________  Name: Ashley [name] MRN: [id number]  Date: 06/16/2019  DOB: [birth year]  Re-Evaluation Note  CC: [name], NP  Derek Jack, MD    ICD-10-CM   1. 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 She was seen in initial consultation on 01/22/2019 and was treated with radiation therapy directed at the right lateral chest wall (30 Gy in 10 fractions using 6x, 10x).  Prior to the radiation therapy she had undergone surgery to remove her lung mass.  the patient tolerated treatment relatively well and followed up on 03/16/2019. During that time, the plan was for prn follow-up with radiation oncology. Prophylactic cranial radiation therapy was briefly discussed.   CT of chest on 05/27/2019 showed right middle lobe consolidation that was somewhat improved in the interval. There was some associated small loculated right pleural effusion post treatment scarring in the right hemithorax. It also showed improving airspace consolidation in the dependent right lower lobe, which may be related to radiation therapy or is infectious/inflammatory in etiology. Left lung nodules were stable. Finally, it showed a possible left hepatic lobe lesion and right adrenal adenoma, suspected in the left as well.   MRI of brain on 05/28/2019 showed no evidence of intracranial metastases.  Since treatment, the patient has been under the care of Dr. Delton Coombes, whom she last saw on 06/01/2019. She underwent four cycles of adjuvant chemotherapy with Carboplatin and VP-16 from 03/09/2019 - 05/11/2019.   On review of systems, the patient reports intermittent throbbing, stabbing left flank pain.  She may need to consult with her primary care physician if this continues to rule out possible UTI.  she denies chest pain and any other symptoms.   Allergies:  is allergic to penicillins and tape.  Meds: Current Outpatient Medications  Medication Sig Dispense Refill  . aspirin EC 81 MG tablet Take 81 mg by mouth daily.    . Calcium Carb-Cholecalciferol (CALCIUM 600+D) 600-800 MG-UNIT TABS Take 1 tablet by mouth 2 (two) times daily.    . Cholecalciferol (VITAMIN D-3) 1000 units CAPS Take 1,000 Units daily by mouth.     . clonazePAM (KLONOPIN) 0.5 MG tablet TAKE ONE TABLET BY MOUTH AT BEDTIME. 90 tablet 0  . cyanocobalamin (,VITAMIN B-12,) 1000 MCG/ML injection INJECT 1 ML INTO THE MUSCLE ONCE MONTHLY AS DIRECTED. (Patient taking differently: Inject 1,000 mcg into the muscle every 30 (thirty) days. ) 1 mL 5  . gabapentin (NEURONTIN) 600 MG tablet Take 1 tablet (600 mg total) by mouth daily. 30 tablet 3  . ibandronate (BONIVA) 150 MG tablet Take 150 mg by mouth every 30 (thirty) days.     Marland Kitchen ibuprofen (ADVIL,MOTRIN) 800 MG tablet Take 800 mg every 8 (eight) hours as needed by mouth for moderate pain.    . methocarbamol (ROBAXIN) 500 MG tablet Take 500 mg by mouth every 6 (six) hours as needed for muscle spasms.     . OXYGEN Inhale 3 L into the lungs daily.    . pantoprazole (PROTONIX) 40 MG tablet Take 1 tablet (40 mg total) by mouth 2 (two) times daily. 60 tablet 2  . polyethylene glycol (MIRALAX / GLYCOLAX) 17 g packet Take 17 g by mouth daily as needed for mild constipation.    . rosuvastatin (CRESTOR)  40 MG tablet Take 40 mg at bedtime by mouth.     . traMADol (ULTRAM) 50 MG tablet Take 50 mg by mouth every 6 (six) hours as needed.    Marland Kitchen albuterol (PROVENTIL HFA;VENTOLIN HFA) 108 (90 Base) MCG/ACT inhaler Inhale 2 puffs into the lungs every 6 (six) hours as needed for wheezing or shortness of breath. (Patient not taking: Reported on 05/11/2019) 1 Inhaler 2  . CARBOPLATIN IV Inject into the vein every 21 ( twenty-one) days.     . ETOPOSIDE IV Inject into the vein every 21 ( twenty-one) days.      Marland Kitchen guaiFENesin (MUCINEX) 600 MG 12 hr tablet Take 1 tablet (600 mg total) by mouth 2 (two) times daily as needed for cough or to loosen phlegm. (Patient not taking: Reported on 06/16/2019)    . HYDROcodone-acetaminophen (NORCO/VICODIN) 5-325 MG tablet Take 1 tablet by mouth every 8 (eight) hours as needed for moderate pain. (Patient not taking: Reported on 06/01/2019) 30 tablet 0  . ipratropium-albuterol (DUONEB) 0.5-2.5 (3) MG/3ML SOLN Take 3 mLs by nebulization every 6 (six) hours as needed (shortness of breath). (Patient not taking: Reported on 05/11/2019) 360 mL 0  . lidocaine-prilocaine (EMLA) cream Apply a small amount to port a cath site and cover with plastic wrap 1 hour prior to chemotherapy appointments (Patient not taking: Reported on 05/11/2019) 30 g 3  . ondansetron (ZOFRAN ODT) 8 MG disintegrating tablet Take 1 tablet (8 mg total) by mouth every 8 (eight) hours as needed for nausea or vomiting. (Patient not taking: Reported on 06/01/2019) 30 tablet 2  . ondansetron (ZOFRAN) 8 MG tablet Take 1 tablet (8 mg total) by mouth every 8 (eight) hours as needed for nausea or vomiting. (Patient not taking: Reported on 05/11/2019) 20 tablet 5  . prochlorperazine (COMPAZINE) 10 MG tablet Take 1 tablet (10 mg total) by mouth every 6 (six) hours as needed (Nausea or vomiting). (Patient not taking: Reported on 05/11/2019) 30 tablet 1   No current facility-administered medications for this encounter.    Physical Findings: The patient is in no acute distress. Patient is alert and oriented.  Accompanied by daughter on evaluation today  height is 5\' 2"  (1.575 m) and weight is 108 lb 2 oz (49 kg). Her temporal temperature is 98.1 F (36.7 C). Her blood pressure is 123/93 (abnormal) and her pulse is 73. Her respiration is 18 and oxygen saturation is 100%.   Lungs are clear to auscultation bilaterally. Heart has regular rate and rhythm. No palpable cervical, supraclavicular, or axillary adenopathy. Abdomen soft,  non-tender, normal bowel sounds.  The neurological examination is nonfocal.  Patient remains in a wheelchair for evaluation.  Lab Findings: Lab Results  Component Value Date   WBC 12.2 (H) 06/01/2019   HGB 9.1 (L) 06/01/2019   HCT 30.1 (L) 06/01/2019   MCV 103.4 (H) 06/01/2019   PLT 472 (H) 06/01/2019    Radiographic Findings: CT Chest W Contrast  Result Date: 05/27/2019 CLINICAL DATA:  Lung cancer.  History of left breast cancer. EXAM: CT CHEST WITH CONTRAST TECHNIQUE: Multidetector CT imaging of the chest was performed during intravenous contrast administration. CONTRAST:  78mL OMNIPAQUE IOHEXOL 300 MG/ML  SOLN COMPARISON:  03/04/2019 and PET 01/26/2019. FINDINGS: Cardiovascular: Left IJ Port-A-Cath terminates in the SVC. Ascending aorta measures 4.3 cm. Atherosclerotic calcification of the aorta, aortic valve and coronary arteries. Pulmonic trunk and heart are enlarged. No pericardial effusion. Mediastinum/Nodes: Low left internal jugular lymph nodes are subcentimeter in short axis size.  Mediastinal lymph nodes measure up to 10 mm in the low right paratracheal station, as before. No hilar or axillary adenopathy. Esophagus is unremarkable. Lungs/Pleura: Centrilobular emphysema. Right upper lobectomy. Post treatment scarring, parenchymal retraction and volume loss in the right hemithorax, similar. Improved, but not resolved, consolidation in the dependent right lower lobe. Right middle lobe consolidation has improved somewhat in the interval. Peripheral left upper lobe nodule measures 4 mm (4/31), stable. Perifissural left lower lobe nodule measures 0.8 x 1.1 cm (4/80), also stable. Probable subpleural atelectasis/scarring in the left lower lobe as before. Adherent debris or nodularity in the right lateral aspect of the upper trachea (4/23), similar. Small loculated right pleural effusion, similar. Upper Abdomen: Possible 1.6 cm low-attenuation lesion in the left hepatic lobe (2/136), not well seen  on the prior study. There is fairly significant streak artifact from spinal hardware. Probable 8 mm subtle low-attenuation lesion in the posterior right hepatic lobe (2/126), stable. Visualized portion of the gallbladder is unremarkable. 2.3 cm lesion off the lateral limb right adrenal gland measures 45 Hounsfield units but is stable in size from 03/04/2019. There may be a nodule in the body of the left adrenal gland, measuring 1.7 cm in 67 Hounsfield units, also similar. Low-attenuation lesions in the kidneys measure up to 2.5 cm on the left, similar likely cysts. Lesion on the right is incompletely imaged. Renal cortical thinning on the left. Visualized portions of the spleen, pancreas, stomach and bowel are unremarkable. Upper abdominal lymph nodes are not enlarged by CT size criteria. Left-sided lumbar hernia contains fat, incompletely imaged. Musculoskeletal: Postoperative and degenerative changes in the spine. No worrisome lytic or sclerotic lesions. IMPRESSION: 1. Right middle lobe consolidation has improved somewhat in the interval. Associated small loculated right pleural effusion post treatment scarring in the right hemithorax. 2. Improving airspace consolidation in the dependent right lower lobe which may be related to radiation therapy or is infectious/inflammatory in etiology. 3. Left lung nodules are stable. Continued attention on follow-up exams is warranted. 4. Possible left hepatic lobe lesion, not well seen on the comparison exam or on PET 01/26/2019. If further evaluation is desired, MR abdomen without and with contrast recommended. 5. Right adrenal adenoma.  Suspect a left adrenal adenoma as well. 6. Ascending Aortic aneurysm NOS (ICD10-I71.9). Recommend annual imaging followup by CTA or MRA. This recommendation follows 2010 ACCF/AHA/AATS/ACR/ASA/SCA/SCAI/SIR/STS/SVM Guidelines for the Diagnosis and Management of Patients with Thoracic Aortic Disease. Circulation. 2010; 121: P329-J188. Aortic  aneurysm NOS (ICD10-I71.9). 7.  Aortic atherosclerosis (ICD10-I70.0). 8. Enlarged pulmonic trunk, indicative of pulmonary arterial hypertension. 9.  Emphysema (ICD10-J43.9). Electronically Signed   By: Lorin Picket M.D.   On: 05/27/2019 11:08   MR Brain W Wo Contrast  Result Date: 05/28/2019 CLINICAL DATA:  Small-cell lung cancer. EXAM: MRI HEAD WITHOUT AND WITH CONTRAST TECHNIQUE: Multiplanar, multiecho pulse sequences of the brain and surrounding structures were obtained without and with intravenous contrast. CONTRAST:  10 mL Gadavist COMPARISON:  11/27/2018 FINDINGS: Brain: There is no evidence of acute infarct, intracranial hemorrhage, mass, midline shift, or extra-axial fluid collection. The ventricles and sulci are within normal limits for age. A few scattered small foci of T2 hyperintensity in cerebral white matter are unchanged and also not considered abnormal for age. No abnormal enhancement is identified. Vascular: Major intracranial vascular flow voids are preserved. Skull and upper cervical spine: Diffusely diminished bone marrow T1 signal intensity in the skull and included upper cervical spine which may be related to the patient's known anemia. No suspicious  focal marrow lesion. Sinuses/Orbits: Unremarkable orbits. Clear paranasal sinuses. Small right mastoid effusion. Other: None. IMPRESSION: No evidence of intracranial metastases. Electronically Signed   By: Logan Bores M.D.   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  As above patient may have a possible hepatic lesion and will undergo MRI of the abdomen next week for further evaluation of this issue.   I discussed in detail the pros and cons of prophylactic cranial radiation with patient and her daughter.  Daughter reports that patient has already had some problems with short-term memory even  prior to starting her chemotherapy and they are concerned that this may worsen with her brain radiation treatment.  At this time the patient is undecided whether she would like to proceed with treatment but once she has had her abdominal MRI, she will make this decision  Plan:   The patient is scheduled for abdominal MRI on 06/26/2019.  After this is completed the patient will make a decision concerning her prophylactic cranial irradiation.  I have asked her to call me once she has made her decision.  -----------------------------------  Blair Promise, PhD, MD  This document serves as a record of services personally performed by Gery Pray, MD. It was created on his behalf by Clerance Lav, a trained medical scribe. The creation of this record is based on the scribe's personal observations and the provider's statements to them. This document has been checked and approved by the attending provider.

## 2019-06-16 ENCOUNTER — Ambulatory Visit
Admission: RE | Admit: 2019-06-16 | Discharge: 2019-06-16 | Disposition: A | Discharge status: Home / Self Care | Payer: Medicare Other | Source: Ambulatory Visit | Attending: Radiation Oncology | Admitting: Radiation Oncology

## 2019-06-16 ENCOUNTER — Other Ambulatory Visit: Payer: Self-pay

## 2019-06-16 ENCOUNTER — Encounter: Payer: Self-pay | Admitting: Radiation Oncology

## 2019-06-16 VITALS — BP 123/93 | HR 73 | Temp 98.1°F | Resp 18 | Ht 62.0 in | Wt 108.1 lb

## 2019-06-16 DIAGNOSIS — J439 Emphysema, unspecified: Secondary | ICD-10-CM | POA: Diagnosis not present

## 2019-06-16 DIAGNOSIS — R109 Unspecified abdominal pain: Secondary | ICD-10-CM | POA: Insufficient documentation

## 2019-06-16 DIAGNOSIS — I712 Thoracic aortic aneurysm, without rupture: Secondary | ICD-10-CM | POA: Insufficient documentation

## 2019-06-16 DIAGNOSIS — I700 Atherosclerosis of aorta: Secondary | ICD-10-CM | POA: Diagnosis not present

## 2019-06-16 DIAGNOSIS — Z79899 Other long term (current) drug therapy: Secondary | ICD-10-CM | POA: Insufficient documentation

## 2019-06-16 DIAGNOSIS — C3411 Malignant neoplasm of upper lobe, right bronchus or lung: Secondary | ICD-10-CM | POA: Diagnosis not present

## 2019-06-16 DIAGNOSIS — C3491 Malignant neoplasm of unspecified part of right bronchus or lung: Secondary | ICD-10-CM

## 2019-06-16 DIAGNOSIS — Z923 Personal history of irradiation: Secondary | ICD-10-CM | POA: Insufficient documentation

## 2019-06-16 DIAGNOSIS — J90 Pleural effusion, not elsewhere classified: Secondary | ICD-10-CM | POA: Diagnosis not present

## 2019-06-16 DIAGNOSIS — D3501 Benign neoplasm of right adrenal gland: Secondary | ICD-10-CM | POA: Insufficient documentation

## 2019-06-16 DIAGNOSIS — Z7982 Long term (current) use of aspirin: Secondary | ICD-10-CM | POA: Insufficient documentation

## 2019-06-16 NOTE — Patient Instructions (Signed)
Coronavirus (COVID-19) Are you at risk?  Are you at risk for the Coronavirus (COVID-19)?  To be considered HIGH RISK for Coronavirus (COVID-19), you have to meet the following criteria:  . Traveled to [place], [place], [place], [place] or [place]; or in the [place] to [place], [place], [place], or [place]; and have fever, cough, and shortness of breath within the last 2 weeks of travel OR . Been in close contact with a person diagnosed with COVID-19 within the last 2 weeks and have fever, cough, and shortness of breath . IF YOU DO NOT MEET THESE CRITERIA, YOU ARE CONSIDERED LOW RISK FOR COVID-19.  What to do if you are HIGH RISK for COVID-19?  . If you are having a medical emergency, call 911. . Seek medical care right away. Before you go to a doctor's office, urgent care or emergency department, call ahead and tell them about your recent travel, contact with someone diagnosed with COVID-19, and your symptoms. You should receive instructions from your physician's office regarding next steps of care.  . When you arrive at healthcare provider, tell the healthcare staff immediately you have returned from visiting [place], [place], [place], [place] or [place]; or traveled in the [place] to [place], [place], [place], or [place]; in the last two weeks or you have been in close contact with a person diagnosed with COVID-19 in the last 2 weeks.   . Tell the health care staff about your symptoms: fever, cough and shortness of breath. . After you have been seen by a medical provider, you will be either: o Tested for (COVID-19) and discharged home on quarantine except to seek medical care if symptoms worsen, and asked to  - Stay home and avoid contact with others until you get your results (4-5 days)  - Avoid travel on public transportation if possible (such as bus, train, or airplane) or o Sent to the Emergency Department by EMS for evaluation, COVID-19 testing, and possible  admission depending on your condition and test results.  What to do if you are LOW RISK for COVID-19?  Reduce your risk of any infection by using the same precautions used for avoiding the common cold or flu:  . Wash your hands often with soap and warm water for at least 20 seconds.  If soap and water are not readily available, use an alcohol-based hand sanitizer with at least 60% alcohol.  . If coughing or sneezing, cover your mouth and nose by coughing or sneezing into the elbow areas of your shirt or coat, into a tissue or into your sleeve (not your hands). . Avoid shaking hands with others and consider head nods or verbal greetings only. . Avoid touching your eyes, nose, or mouth with unwashed hands.  . Avoid close contact with people who are sick. . Avoid places or events with large numbers of people in one location, like concerts or sporting events. . Carefully consider travel plans you have or are making. . If you are planning any travel outside or inside the [place], visit the CDC's Travelers' Health webpage for the latest health notices. . If you have some symptoms but not all symptoms, continue to monitor at home and seek medical attention if your symptoms worsen. . If you are having a medical emergency, call 911.   ADDITIONAL HEALTHCARE OPTIONS FOR PATIENTS  [facility] / e-Visit: [email or web address]         [facility]: [phone]  [facility]: [phone]                   [facility]: [phone]   

## 2019-06-16 NOTE — Progress Notes (Signed)
Histology and Location of Primary Cancer: Small cell lung cancer, essential thrombocytosis  Location(s) of Symptomatic tumor(s): Pt referred for PCI. Recent Brain MRI 05/28/19:  IMPRESSION: No evidence of intracranial metastases.  Past/Anticipated chemotherapy by medical oncology, if any: Per Dr. Delton Coombes 06/01/19:  ASSESSMENT & PLAN:   Small cell lung carcinoma, right ([place]) 1.  Stage III (T3N1) small cell lung cancer: -Right upper lobectomy on 12/30/2018, pathology showing 4.2 cm small cell cancer, involving visceral pleura, 1 out of 3 lymph nodes positive, LVI positive, metastatic carcinoma in 2 of 11 R lymph nodes.  Right chest wall biopsy consistent with small cell carcinoma. -30 Gy of radiation in 10 fractions from 01/28/2019 through 02/10/2019. -PET scan on 01/26/2019 shows dense consolidative airspace disease in the posterior right base, markedly hypermetabolic SUV 8.  Loculated anterior pleural fluid in the right hemithorax with adjacent airspace consolidation is hypermetabolic with SUV 4.1.  Hypermetabolic uptake identified along the right lung staple lines.  2.5 cm focus of soft tissue in the right axilla with SUV 2.2.  Precarinal lymph node, short axis 9 millimeters with SUV 2.5.  12 mm short axis subcarinal lymph node with SUV 3. -CT scan on 02/22/2019 showed stable findings.  Borderline mediastinal lymph nodes present. -4 cycles of adjuvant chemotherapy with carboplatin and VP-16 from 03/09/2019 through 05/11/2019. -We reviewed results of the brain MRI which did not show any evidence of metastatic disease done on 05/28/2019. -CT of the chest on 05/27/2019 showed improved right middle lobe consolidation.  1.6 cm possible left hepatic lesion.  We will order MRI of the abdomen with and without contrast. -I will see her back after the MRI.  2.  Weight loss: -Her weight has been stable.  It will likely improve now as she finished her chemotherapy.  3.  Macrocytic anemia: -Last transfusion  was on 03/18/2019. -Hemoglobin is between 8 and 9.  This is from myelosuppression from chemotherapy.  4.  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 -We will check her CBC at next visit.  We will continue to hold it at this time.  5.  Peripheral neuropathy: -She will continue gabapentin 600 mg at bedtime.     Pain on a scale of 0-10 is: Pt reports intermittent throbbing, stabbing pain LEFT FLANK, rated 6-10/10.    Ambulatory status? Walker? Wheelchair?: Pt presents today in [place]. Pt with usual unsteady gait, furniture surfs at home household distances.  SAFETY ISSUES: Prior radiation? Yes, Radiation Treatment Dates: 01/28/2019 through 02/10/2019 Site Technique Total Dose (Gy) Dose per Fx (Gy) Completed Fx Beam Energies  Thorax: Lung_Rt 3D 30/30 3 10/10 6X, 10X      Pacemaker/ICD? No  Possible current pregnancy? No  Is the patient on methotrexate? No  Additional Complaints / other details:  Pt presents today for reconsult with Dr. Sondra Come for Radiation Oncology. Pt is accompanied by daughter, Hollie.  BP (!) 123/93 (BP Location: Right Arm, Patient Position: Sitting)   Pulse 73   Temp 98.1 F (36.7 C) (Temporal)   Resp 18   Ht 5\' 2"  (1.575 m)   Wt 108 lb 2 oz (49 kg)   SpO2 100%   BMI 19.78 kg/m   Wt Readings from Last 3 Encounters:  06/16/19 108 lb 2 oz (49 kg)  06/01/19 106 lb 6.4 oz (48.3 kg)  05/11/19 106 lb 12.8 oz (48.4 kg)   Loma Sousa, RN BSN

## 2019-06-22 ENCOUNTER — Other Ambulatory Visit: Payer: Medicare Other

## 2019-06-23 ENCOUNTER — Ambulatory Visit (HOSPITAL_COMMUNITY): Payer: Medicare Other | Admitting: Hematology

## 2019-06-23 DIAGNOSIS — I2510 Atherosclerotic heart disease of native coronary artery without angina pectoris: Secondary | ICD-10-CM | POA: Insufficient documentation

## 2019-06-24 ENCOUNTER — Ambulatory Visit (HOSPITAL_COMMUNITY): Payer: Medicare Other | Admitting: Hematology

## 2019-06-26 ENCOUNTER — Ambulatory Visit
Admission: RE | Admit: 2019-06-26 | Discharge: 2019-06-26 | Disposition: A | Discharge status: Home / Self Care | Payer: Medicare Other | Source: Ambulatory Visit | Attending: Hematology | Admitting: Hematology

## 2019-06-26 ENCOUNTER — Other Ambulatory Visit: Payer: Self-pay

## 2019-06-26 DIAGNOSIS — C3491 Malignant neoplasm of unspecified part of right bronchus or lung: Secondary | ICD-10-CM

## 2019-06-26 MED ORDER — GADOBENATE DIMEGLUMINE 529 MG/ML IV SOLN
9.0000 mL | Freq: Once | INTRAVENOUS | Status: AC | PRN
  Administered 2019-06-26: 9 mL via INTRAVENOUS

## 2019-06-26 MED ORDER — HEPARIN SOD (PORK) LOCK FLUSH 100 UNIT/ML IV SOLN
500.0000 [IU] | Freq: Once | INTRAVENOUS | Status: AC
  Administered 2019-06-26: 500 [IU] via INTRAVENOUS

## 2019-06-29 ENCOUNTER — Other Ambulatory Visit: Payer: Self-pay

## 2019-06-29 ENCOUNTER — Encounter (HOSPITAL_COMMUNITY): Payer: Self-pay | Admitting: Hematology

## 2019-06-29 ENCOUNTER — Inpatient Hospital Stay (HOSPITAL_COMMUNITY): Payer: Medicare Other | Attending: Hematology | Admitting: Hematology

## 2019-06-29 DIAGNOSIS — Z87891 Personal history of nicotine dependence: Secondary | ICD-10-CM | POA: Diagnosis not present

## 2019-06-29 DIAGNOSIS — Z79899 Other long term (current) drug therapy: Secondary | ICD-10-CM | POA: Insufficient documentation

## 2019-06-29 DIAGNOSIS — D473 Essential (hemorrhagic) thrombocythemia: Secondary | ICD-10-CM | POA: Insufficient documentation

## 2019-06-29 DIAGNOSIS — Z85820 Personal history of malignant melanoma of skin: Secondary | ICD-10-CM | POA: Insufficient documentation

## 2019-06-29 DIAGNOSIS — C3411 Malignant neoplasm of upper lobe, right bronchus or lung: Secondary | ICD-10-CM | POA: Diagnosis present

## 2019-06-29 DIAGNOSIS — C3491 Malignant neoplasm of unspecified part of right bronchus or lung: Secondary | ICD-10-CM

## 2019-06-29 DIAGNOSIS — Z9012 Acquired absence of left breast and nipple: Secondary | ICD-10-CM | POA: Insufficient documentation

## 2019-06-29 DIAGNOSIS — G629 Polyneuropathy, unspecified: Secondary | ICD-10-CM | POA: Insufficient documentation

## 2019-06-29 DIAGNOSIS — Z853 Personal history of malignant neoplasm of breast: Secondary | ICD-10-CM | POA: Insufficient documentation

## 2019-06-29 DIAGNOSIS — D539 Nutritional anemia, unspecified: Secondary | ICD-10-CM | POA: Diagnosis not present

## 2019-06-29 LAB — CBC WITH DIFFERENTIAL/PLATELET
Abs Immature Granulocytes: 0.02 10*3/uL (ref 0.00–0.07)
Basophils Absolute: 0.1 10*3/uL (ref 0.0–0.1)
Basophils Relative: 1 %
Eosinophils Absolute: 0.3 10*3/uL (ref 0.0–0.5)
Eosinophils Relative: 4 %
HCT: 33.3 % — ABNORMAL LOW (ref 36.0–46.0)
Hemoglobin: 10.3 g/dL — ABNORMAL LOW (ref 12.0–15.0)
Immature Granulocytes: 0 %
Lymphocytes Relative: 11 %
Lymphs Abs: 0.9 10*3/uL (ref 0.7–4.0)
MCH: 30.2 pg (ref 26.0–34.0)
MCHC: 30.9 g/dL (ref 30.0–36.0)
MCV: 97.7 fL (ref 80.0–100.0)
Monocytes Absolute: 0.7 10*3/uL (ref 0.1–1.0)
Monocytes Relative: 8 %
Neutro Abs: 6.4 10*3/uL (ref 1.7–7.7)
Neutrophils Relative %: 76 %
Platelets: 338 10*3/uL (ref 150–400)
RBC: 3.41 MIL/uL — ABNORMAL LOW (ref 3.87–5.11)
RDW: 13.8 % (ref 11.5–15.5)
WBC: 8.4 10*3/uL (ref 4.0–10.5)
nRBC: 0 % (ref 0.0–0.2)

## 2019-06-29 LAB — VITAMIN B12: Vitamin B-12: 4318 pg/mL — ABNORMAL HIGH (ref 180–914)

## 2019-06-29 LAB — LIPID PANEL
Cholesterol: 115 mg/dL (ref 0–200)
HDL: 38 mg/dL — ABNORMAL LOW (ref >40)
LDL Cholesterol: 54 mg/dL (ref 0–99)
Total CHOL/HDL Ratio: 3 RATIO
Triglycerides: 115 mg/dL (ref <150)
VLDL: 23 mg/dL (ref 0–40)

## 2019-06-29 LAB — FOLATE: Folate: 6.6 ng/mL (ref >5.9)

## 2019-06-29 LAB — IRON AND TIBC
Iron: 41 ug/dL (ref 28–170)
Saturation Ratios: 15 % (ref 10.4–31.8)
TIBC: 268 ug/dL (ref 250–450)
UIBC: 227 ug/dL

## 2019-06-29 LAB — FERRITIN: Ferritin: 195 ng/mL (ref 11–307)

## 2019-06-29 MED ORDER — HEPARIN SOD (PORK) LOCK FLUSH 100 UNIT/ML IV SOLN
500.0000 [IU] | Freq: Once | INTRAVENOUS | Status: AC
  Administered 2019-06-29: 500 [IU] via INTRAVENOUS

## 2019-06-29 MED ORDER — SODIUM CHLORIDE 0.9% FLUSH
20.0000 mL | INTRAVENOUS | Status: DC | PRN
  Administered 2019-06-29: 20 mL via INTRAVENOUS

## 2019-06-29 NOTE — Progress Notes (Signed)
Labs drawn from portacath easily today with port flushed per protocol then de-accessed. Pt discharged via wheelchair in satisfactory condition accompanied by her daughter

## 2019-06-29 NOTE — Addendum Note (Signed)
Addended by: Anselmo Rod on: 06/29/2019 01:11 PM   Modules accepted: Orders

## 2019-06-29 NOTE — Assessment & Plan Note (Addendum)
1.  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  Right chest wall biopsy consistent with small cell carcinoma. -30 Gy of radiation in 10 fractions from 01/28/2019 through 02/10/2019. -PET scan on 01/26/2019 shows dense consolidative airspace disease in the posterior right base, markedly hypermetabolic SUV 8.  Loculated anterior pleural fluid in the right hemithorax with adjacent airspace consolidation is hypermetabolic with SUV 4.1.  Hypermetabolic uptake identified along the right lung staple lines.  2.5 cm focus of the soft tissue in the right axilla with SUV 2.2.  Precarinal lymph node, short Axis IX millimeters with SUV 2.5.  12 mm short axis subcarinal lymph node with SUV 3. -CT of the chest on 05/27/2019 showed improved right middle lobe consolidation.  1.6 cm possible left hepatic lesion. -MRI of the abdomen on 06/26/2019 showed normal left hepatic lobe lesion.  There is hemosiderin deposition in the liver as well as adrenals. -Hemosiderin deposition is likely from transfusions. -She met with Dr. Sondra Come for PCI.  She is thinking about it. -We plan to repeat CT scan of the chest in 3 months.  2.  Essential thrombocytosis: -Hydroxyurea on hold since chemotherapy started. -Last platelet count was 472 on 06/01/2019. -We will check platelet count today and if it is going up we will start her back on Hydrea. -If the platelet count is more than 500, we will start her back on Hydrea.  I will reevaluate her in 3 to 4 weeks.  3.  Macrocytic anemia: -Last transfusion on 03/2019. -Last hemoglobin was 9.1 with MCV 103. -Last ferritin was 107 on 03/16/2019.  Percent saturation was 24.  I plan to repeat anemia labs today.  4.  Left flank pain: -She reported left flank pain of several months duration on and off.  It is sharp  and at times become dull pain.  No hematuria.  No fevers or chills. -I reviewed MRI which showed scoliosis with multilevel lumbar fusion. -She was told to take tramadol as needed.

## 2019-06-29 NOTE — Patient Instructions (Addendum)
[place] at [facility] Discharge Instructions  You were seen today by Dr. Delton Coombes. He went over your recent lab results. He will see you back in 3 months for labs, scans and follow up.   Thank you for choosing [place] at [facility] to provide your oncology and hematology care.  To afford each patient quality time with our provider, please arrive at least 15 minutes before your scheduled appointment time.   If you have a lab appointment with the [facility] please come in thru the  Main Entrance and check in at the main information desk  You need to re-schedule your appointment should you arrive 10 or more minutes late.  We strive to give you quality time with our providers, and arriving late affects you and other patients whose appointments are after yours.  Also, if you no show three or more times for appointments you may be dismissed from the clinic at the providers discretion.     Again, thank you for choosing [facility].  Our hope is that these requests will decrease the amount of time that you wait before being seen by our physicians.       _____________________________________________________________  Should you have questions after your visit to [facility], please contact our office at [phone] between the hours of 8:00 a.m. and 4:30 p.m.  Voicemails left after 4:00 p.m. will not be returned until the following business day.  For prescription refill requests, have your pharmacy contact our office and allow 72 hours.    Cancer Center Support Programs:   > Cancer Support Group  2nd Tuesday of the month 1pm-2pm, Journey Room

## 2019-06-29 NOTE — Progress Notes (Signed)
[address] [place], [place] [address]   CLINIC:  Medical Oncology/Hematology  PCP:  [name], NP [address] [place] [address] [phone]   REASON FOR VISIT: Small cell lung cancer, essential thrombocytosis.  CURRENT THERAPY:Carboplatin and VP-16.    BRIEF ONCOLOGIC HISTORY:  Oncology History  Adenocarcinoma of left breast ([facility])  09/29/1993 - 05/14/1994 Chemotherapy    AC Q 3 weeks X 6 cycles   01/02/1994 Surgery   Left modified radical mastectomy   01/02/1994 Pathology Results   ER-, PR - with a single positive LN found in the L axillae, Stage II disease   07/22/2015 Imaging   Bone Scan, No metastatic pattern uptake, degenerative changes in the lumbar spine with dextroscoliosis   05/25/2016 PET scan   No findings of active malignancy in the neck, chest, abdomen, or pelvis. The 3 liver lesions are not hypermetabolic. The radiologist suspects they may be subtly present on prior CT chest from 10/2013, to further reassuring that these are likely benign lesions.   Melanoma ([facility])  07/09/2013 Initial Biopsy   Initial biopsy L upper thigh/buttocks melanoma   07/20/2013 Surgery   Excision L lateral buttock/upper thigh melanoma, clear margins   07/20/2013 Pathology Results   [place]low depth 1.02 mm, pT2a    Small cell lung carcinoma, right ([facility])  11/27/2018 Initial Diagnosis   Small cell lung carcinoma, right ([facility])   03/09/2019 -  Chemotherapy   The patient had palonosetron (ALOXI) injection 0.25 mg, 0.25 mg, Intravenous,  Once, 4 of 4 cycles Administration: 0.25 mg (03/09/2019), 0.25 mg (03/30/2019), 0.25 mg (04/20/2019), 0.25 mg (05/11/2019) pegfilgrastim (NEULASTA ONPRO KIT) injection 6 mg, 6 mg, Subcutaneous, Once, 1 of 1 cycle Administration: 6 mg (03/11/2019) pegfilgrastim-jmdb (FULPHILA) injection 6 mg, 6 mg, Subcutaneous,  Once, 3 of 3 cycles Administration: 6 mg (04/03/2019), 6 mg (04/24/2019), 6 mg (05/15/2019) CARBOplatin  (PARAPLATIN) 330 mg in sodium chloride 0.9 % 250 mL chemo infusion, 330 mg (100 % of original dose 325.5 mg), Intravenous,  Once, 4 of 4 cycles Dose modification:   (original dose 325.5 mg, Cycle 1),   (original dose 325.5 mg, Cycle 2),   (original dose 309 mg, Cycle 3),   (original dose 325.5 mg, Cycle 4) Administration: 330 mg (03/09/2019), 330 mg (03/30/2019), 310 mg (04/20/2019), 330 mg (05/11/2019) etoposide (VEPESID) 150 mg in sodium chloride 0.9 % 500 mL chemo infusion, 100 mg/m2 = 150 mg, Intravenous,  Once, 4 of 4 cycles Administration: 150 mg (03/09/2019), 150 mg (03/10/2019), 150 mg (03/11/2019), 150 mg (03/30/2019), 150 mg (03/31/2019), 150 mg (04/01/2019), 150 mg (04/20/2019), 150 mg (04/21/2019), 150 mg (04/22/2019), 150 mg (05/11/2019), 150 mg (05/12/2019), 150 mg (05/13/2019) fosaprepitant (EMEND) 150 mg, dexamethasone (DECADRON) 12 mg in sodium chloride 0.9 % 145 mL IVPB, , Intravenous,  Once, 4 of 4 cycles Administration:  (03/09/2019),  (03/30/2019),  (04/20/2019),  (05/11/2019)  for chemotherapy treatment.    03/09/2019 Cancer Staging   Staging form: Lung, AJCC 8th Edition - Clinical: Stage IIIA (cT3, cN1, cM0) - Signed by Derek Jack, MD on 03/09/2019      INTERVAL HISTORY:  Ms. Deschene 69 y.o. female seen for follow-up of small cell lung cancer and essential thrombocytosis.  Reports appetite and energy levels 25%.  Reports pain in the left flank region rated as 4 out of 10, sharp and at times becomes dull.  She had this pain for several months.  Sometimes she wakes up with pain at night.  Baseline constipation  is stable.  Numbness in the hands and feet has been stable.  REVIEW OF SYSTEMS:  Review of Systems  Constitutional: Positive for fatigue.  Respiratory: Positive for shortness of breath.   Gastrointestinal: Positive for constipation and nausea.  Neurological: Positive for numbness.  All other systems reviewed and are negative.    PAST MEDICAL/SURGICAL HISTORY:  Past  Medical History:  Diagnosis Date  . Adenocarcinoma of left breast ([facility]) 01/09/2016  . Anginal pain ([facility])   . Arthritis   . Ascending aortic aneurysm ([facility])   . Cancer [facility]) 1995   breast, left, mastectomy/chemo  . Chest pain    Possibly cardiac. No evidence of ischemia/injury based upon normal troponin I. Chest discomfort could be tachycardia induced supply demand mismatch.   . CHF (congestive heart failure) ([facility]) 11/17/2015   after surgery   . Colon adenomas   . Coronary artery disease   . DJD (degenerative joint disease)   . Dyspnea    with exertion  . Emphysema of lung ([facility]) 11/17/2015  . Essential hypertension, benign   . GERD (gastroesophageal reflux disease)   . History of hiatal hernia   . Hyperlipidemia   . Hypertension   . Melanoma ([facility]) 01/09/2016  . Osteopenia   . Palpitations   . Pernicious anemia 03/06/2016  . Pernicious anemia   . Pure hypercholesterolemia   . Raynaud's disease   . Thrombocythemia, essential ([facility]) 01/09/2016  . Thrombocytosis (HCC)    Idiopathic  . Vitamin D deficiency    Past Surgical History:  Procedure Laterality Date  . ABDOMINAL HYSTERECTOMY    . ANTERIOR AND POSTERIOR REPAIR N/A 12/09/2014   Procedure: ANTERIOR (CYSTOCELE) AND POSTERIOR REPAIR (RECTOCELE);  Surgeon: Bjorn Loser, MD;  Location: [facility];  Service: Urology;  Laterality: N/A;  . ANTERIOR CERVICAL DECOMPRESSION/DISCECTOMY FUSION 4 LEVELS Right 10/03/2016   Procedure: ANTERIOR CERVICAL DECOMPRESSION FUSION, CERVICAL 4-5, CERVICAL 5-6, CERVICAL 6-7, CERVICAL 7 TO THORACIC 1 WITH INSTRUMENTATION AND ALLOGRAFT;  Surgeon: Phylliss Bob, MD;  Location: [facility];  Service: Orthopedics;  Laterality: Right;  ANTERIOR CERVICAL DECOMPRESSION FUSION, CERVICAL 4-5, CERVICAL 5-6, CERVICAL 6-7, CERVICAL 7 TO THORACIC 1 WITH INSTRUMENTATION AND ALLOGRAFT; REQUEST 4 HO  . ANTERIOR LAT LUMBAR FUSION Left 11/16/2015   Procedure: LEFT SIDED LATERAL INTERBODY FUSION, LUMBAR 2-3, LUMBAR 3-4, LUMBAR 4-5  WITH INSTRUMENTATION;  Surgeon: Phylliss Bob, MD;  Location: [facility];  Service: Orthopedics;  Laterality: Left;  LEFT SIDED LATEARL INTERBODY FUSION, LUMBAR 2-3, LUMBAR 3-4, LUMBAR 4-5 WITH INSTRUMENTATION   . APPENDECTOMY    . BACK SURGERY    . BONE MARROW ASPIRATION  07/2012  . BONE MARROW BIOPSY  07/2012  . BREAST SURGERY    . CARDIAC CATHETERIZATION    . CARDIAC CATHETERIZATION N/A 01/20/2016   Procedure: Left Heart Cath and Coronary Angiography;  Surgeon: Burnell Blanks, MD;  Location: [facility];  Service: Cardiovascular;  Laterality: N/A;  . COLONOSCOPY  11/29/2010   Procedure: COLONOSCOPY;  Surgeon: Rogene Houston, MD;  [facility];  Service: Endoscopy;  Laterality: N/A;  . COLONOSCOPY N/A 02/18/2014   Procedure: COLONOSCOPY;  Surgeon: Rogene Houston, MD;  [facility];  Service: Endoscopy;  Laterality: N/A;  1030  . COLONOSCOPY N/A 02/25/2017   Procedure: COLONOSCOPY;  Surgeon: Rogene Houston, MD;  [facility];  Service: Endoscopy;  Laterality: N/A;  10:55  . CYSTOSCOPY N/A 12/09/2014   Procedure: CYSTOSCOPY;  Surgeon: Bjorn Loser, MD;  Location: [facility];  Service: Urology;  Laterality: N/A;  . ESOPHAGEAL DILATION N/A 02/25/2017   Procedure: ESOPHAGEAL DILATION;  Surgeon: Rogene Houston, MD;  [facility];  Service: Endoscopy;  Laterality: N/A;  . ESOPHAGOGASTRODUODENOSCOPY N/A 02/25/2017   Procedure: ESOPHAGOGASTRODUODENOSCOPY (EGD);  Surgeon: Rogene Houston, MD;  [facility];  Service: Endoscopy;  Laterality: N/A;  . IR GENERIC HISTORICAL  01/11/2016   IR RADIOLOGY PERIPHERAL GUIDED IV START 01/11/2016 Saverio Danker, PA-C [facility]  . IR GENERIC HISTORICAL  01/11/2016   IR US GUIDE VASC ACCESS RIGHT 01/11/2016 Saverio Danker, PA-C [facility]  . LYMPH NODE DISSECTION Right 12/30/2018   Procedure: Lymph Node Dissection;  Surgeon: Lajuana Matte, MD;  Location: [facility];  Service: Thoracic;   Laterality: Right;  . MASTECTOMY     left  . OVARIAN CYST SURGERY     x2  . POLYPECTOMY  02/25/2017   Procedure: POLYPECTOMY;  Surgeon: Rogene Houston, MD;  [facility];  Service: Endoscopy;;  . PORTACATH PLACEMENT Left 02/16/2019   Procedure: INSERTION PORT-A-CATH (CATHETER  LEFT SUBCLAVIAN);  Surgeon: Aviva Signs, MD;  Location[facility];  Service: General;  Laterality: Left;  . SALPINGOOPHORECTOMY Bilateral 12/09/2014   Procedure: SALPINGO OOPHORECTOMY;  Surgeon: Servando Salina, MD;  Location: [facility];  Service: Gynecology;  Laterality: Bilateral;  . TUBAL LIGATION    . VAGINAL HYSTERECTOMY N/A 12/09/2014   Procedure: HYSTERECTOMY VAGINAL;  Surgeon: Servando Salina, MD;  Location: [facility];  Service: Gynecology;  Laterality: N/A;  . VIDEO ASSISTED THORACOSCOPY (VATS)/ LOBECTOMY Right 12/30/2018   Procedure: VIDEO ASSISTED THORACOSCOPY (VATS)/RIGHT LOWER LOBE WEDGE RESECTION, RIGHT UPPER LOBECTOMY;  Surgeon: Lajuana Matte, MD;  Location: [facility];  Service: Thoracic;  Laterality: Right;  Marland Kitchen VIDEO BRONCHOSCOPY N/A 12/30/2018   Procedure: VIDEO BRONCHOSCOPY;  Surgeon: Lajuana Matte, MD;  Location[facility];  Service: Thoracic;  Laterality: N/A;     SOCIAL HISTORY:  Social History   Socioeconomic History  . Marital status: Divorced    Spouse name: Not on file  . Number of children: 2  . Years of education: Not on file  . Highest education level: Not on file  Occupational History  . Not on file  Tobacco Use  . Smoking status: Former Smoker    Packs/day: 0.50    Years: 50.00    Pack years: 25.00    Types: Cigarettes    Quit date: 11/25/2018    Years since quitting: 0.5  . Smokeless tobacco: Never Used  Substance and Sexual Activity  . Alcohol use: Yes    Comment: occasional  . Drug use: No  . Sexual activity: Yes    Birth control/protection: Post-menopausal  Other Topics Concern  . Not on file  Social History Narrative  . Not on file   Social  Determinants of Health   Financial Resource Strain:   . Difficulty of Paying Living Expenses:   Food Insecurity:   . Worried About Charity fundraiser in the Last Year:   . Arboriculturist in the Last Year:   Transportation Needs:   . Film/video editor (Medical):   Marland Kitchen Lack of Transportation (Non-Medical):   Physical Activity:   . Days of Exercise per Week:   . Minutes of Exercise per Session:   Stress:   . Feeling of Stress :   Social Connections:   . Frequency of Communication with Friends and Family:   . Frequency of Social Gatherings with Friends and  Family:   . Attends Religious Services:   . Active Member of Clubs or Organizations:   . Attends Archivist Meetings:   Marland Kitchen Marital Status:   Intimate Partner Violence:   . Fear of Current or Ex-Partner:   . Emotionally Abused:   Marland Kitchen Physically Abused:   . Sexually Abused:     FAMILY HISTORY:  Family History  Problem Relation Age of Onset  . Hypertension Mother   . Heart failure Mother   . Congestive Heart Failure Mother   . COPD Mother   . Pernicious anemia Mother   . Cancer Mother        lung  . Hypertension Father   . CAD Father   . Heart attack Father   . Hypertension Sister   . Cancer Other   . Celiac disease Other     CURRENT MEDICATIONS:  Outpatient Encounter Medications as of 06/29/2019  Medication Sig  . aspirin EC 81 MG tablet Take 81 mg by mouth daily.  . Calcium Carb-Cholecalciferol (CALCIUM 600+D) 600-800 MG-UNIT TABS Take 1 tablet by mouth 2 (two) times daily.  Marland Kitchen CARBOPLATIN IV Inject into the vein every 21 ( twenty-one) days.   . Cholecalciferol (VITAMIN D-3) 1000 units CAPS Take 1,000 Units daily by mouth.   . clonazePAM (KLONOPIN) 0.5 MG tablet TAKE ONE TABLET BY MOUTH AT BEDTIME.  . cyanocobalamin (,VITAMIN B-12,) 1000 MCG/ML injection INJECT 1 ML INTO THE MUSCLE ONCE MONTHLY AS DIRECTED. (Patient taking differently: Inject 1,000 mcg into the muscle every 30 (thirty) days. )  .  ETOPOSIDE IV Inject into the vein every 21 ( twenty-one) days.  Marland Kitchen gabapentin (NEURONTIN) 600 MG tablet Take 1 tablet (600 mg total) by mouth daily.  Marland Kitchen ibandronate (BONIVA) 150 MG tablet Take 150 mg by mouth every 30 (thirty) days.   Marland Kitchen lidocaine-prilocaine (EMLA) cream Apply a small amount to port a cath site and cover with plastic wrap 1 hour prior to chemotherapy appointments  . nitroGLYCERIN (NITRODUR - DOSED IN MG/24 HR) 0.1 mg/hr patch Place 0.1 mg onto the skin daily.   . OXYGEN Inhale 3 L into the lungs daily.  . pantoprazole (PROTONIX) 40 MG tablet Take 1 tablet (40 mg total) by mouth 2 (two) times daily.  . rosuvastatin (CRESTOR) 40 MG tablet Take 40 mg at bedtime by mouth.   Marland Kitchen albuterol (PROVENTIL HFA;VENTOLIN HFA) 108 (90 Base) MCG/ACT inhaler Inhale 2 puffs into the lungs every 6 (six) hours as needed for wheezing or shortness of breath. (Patient not taking: Reported on 05/11/2019)  . guaiFENesin (MUCINEX) 600 MG 12 hr tablet Take 1 tablet (600 mg total) by mouth 2 (two) times daily as needed for cough or to loosen phlegm. (Patient not taking: Reported on 06/16/2019)  . HYDROcodone-acetaminophen (NORCO/VICODIN) 5-325 MG tablet Take 1 tablet by mouth every 8 (eight) hours as needed for moderate pain. (Patient not taking: Reported on 06/01/2019)  . ibuprofen (ADVIL,MOTRIN) 800 MG tablet Take 800 mg every 8 (eight) hours as needed by mouth for moderate pain.  Marland Kitchen ipratropium-albuterol (DUONEB) 0.5-2.5 (3) MG/3ML SOLN Take 3 mLs by nebulization every 6 (six) hours as needed (shortness of breath). (Patient not taking: Reported on 05/11/2019)  . methocarbamol (ROBAXIN) 500 MG tablet Take 500 mg by mouth every 6 (six) hours as needed for muscle spasms.   . ondansetron (ZOFRAN ODT) 8 MG disintegrating tablet Take 1 tablet (8 mg total) by mouth every 8 (eight) hours as needed for nausea or vomiting. (Patient not taking:  Reported on 06/01/2019)  . ondansetron (ZOFRAN) 8 MG tablet Take 1 tablet (8 mg total)  by mouth every 8 (eight) hours as needed for nausea or vomiting. (Patient not taking: Reported on 05/11/2019)  . polyethylene glycol (MIRALAX / GLYCOLAX) 17 g packet Take 17 g by mouth daily as needed for mild constipation.  . prochlorperazine (COMPAZINE) 10 MG tablet Take 1 tablet (10 mg total) by mouth every 6 (six) hours as needed (Nausea or vomiting). (Patient not taking: Reported on 05/11/2019)  . traMADol (ULTRAM) 50 MG tablet Take 50 mg by mouth every 6 (six) hours as needed.   No facility-administered encounter medications on file as of 06/29/2019.    ALLERGIES:  Allergies  Allergen Reactions  . Penicillins Hives and Rash    Did it involve swelling of the face/tongue/throat, SOB, or low BP? No Did it involve sudden or severe rash/hives, skin peeling, or any reaction on the inside of your mouth or nose? No Did you need to seek medical attention at a hospital or doctor's office? No When did it last happen?5-10 year If all above answers are "NO", may proceed with cephalosporin use.    . Tape Rash    Medipore, Coban, and paper tape CAN be tolerated     PHYSICAL EXAM:  ECOG Performance status: 1  Vitals:   06/29/19 1201  BP: (!) 107/59  Pulse: 82  Resp: 18  Temp: (!) 96.8 F (36 C)  SpO2: 98%   Filed Weights   06/29/19 1201  Weight: 109 lb 9.6 oz (49.7 kg)    Physical Exam Constitutional:      Appearance: Normal appearance. She is normal weight.  Cardiovascular:     Rate and Rhythm: Normal rate and regular rhythm.     Heart sounds: Normal heart sounds.  Pulmonary:     Effort: Pulmonary effort is normal.     Breath sounds: Normal breath sounds.  Abdominal:     General: Abdomen is flat. There is no distension.     Palpations: Abdomen is soft. There is no mass.  Musculoskeletal:        General: Normal range of motion.  Skin:    General: Skin is warm and dry.  Neurological:     Mental Status: She is alert and oriented to person, place, and time. Mental  status is at baseline.  Psychiatric:        Mood and Affect: Mood normal.        Behavior: Behavior normal.        Thought Content: Thought content normal.        Judgment: Judgment normal.      LABORATORY DATA:  I have reviewed the labs as listed.  CBC    Component Value Date/Time   WBC 12.2 (H) 06/01/2019 1000   RBC 2.91 (L) 06/01/2019 1000   HGB 9.1 (L) 06/01/2019 1000   HCT 30.1 (L) 06/01/2019 1000   PLT 472 (H) 06/01/2019 1000   MCV 103.4 (H) 06/01/2019 1000   MCH 31.3 06/01/2019 1000   MCHC 30.2 06/01/2019 1000   RDW 17.3 (H) 06/01/2019 1000   LYMPHSABS 0.8 06/01/2019 1000   MONOABS 1.1 (H) 06/01/2019 1000   EOSABS 0.1 06/01/2019 1000   BASOSABS 0.1 06/01/2019 1000   CMP Latest Ref Rng & Units 06/01/2019 05/11/2019 04/20/2019  Glucose 70 - 99 mg/dL 105(H) 112(H) 101(H)  BUN 8 - 23 mg/dL _0 Creatinine 0.44 - 1.00 mg/dL 0.98 0.97 1.09(H)  Sodium 135 -  145 mmol/L 136 135 138  Potassium 3.5 - 5.1 mmol/L 3.6 3.8 3.8  Chloride 98 - 111 mmol/L 104 102 103  CO2 22 - 32 mmol/L _0 Calcium 8.9 - 10.3 mg/dL 8.8(L) 8.8(L) 9.1  Total Protein 6.5 - 8.1 g/dL 7.1 6.4(L) 6.3(L)  Total Bilirubin 0.3 - 1.2 mg/dL 0.5 0.6 0.4  Alkaline Phos 38 - 126 U/L 98 95 90  AST 15 - 41 U/L _1 ALT 0 - 44 U/L _2 DIAGNOSTIC IMAGING:  I have independently reviewed the scans.     ASSESSMENT & PLAN:   Small cell lung carcinoma, right (HCC) 1.  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  Right chest wall biopsy consistent with small cell carcinoma. -30 Gy of radiation in 10 fractions from 01/28/2019 through 02/10/2019. -PET scan on 01/26/2019 shows dense consolidative airspace disease in the posterior right base, markedly hypermetabolic SUV 8.  Loculated anterior pleural fluid in  the right hemithorax with adjacent airspace consolidation is hypermetabolic with SUV 4.1.  Hypermetabolic uptake identified along the right lung staple lines.  2.5 cm focus of the soft tissue in the right axilla with SUV 2.2.  Precarinal lymph node, short Axis IX millimeters with SUV 2.5.  12 mm short axis subcarinal lymph node with SUV 3. -CT of the chest on 05/27/2019 showed improved right middle lobe consolidation.  1.6 cm possible left hepatic lesion. -MRI of the abdomen on 06/26/2019 showed normal left hepatic lobe lesion.  There is hemosiderin deposition in the liver as well as adrenals. -Hemosiderin deposition is likely from transfusions. -She met with Dr. Sondra Come for PCI.  She is thinking about it. -We plan to repeat CT scan of the chest in 3 months.  2.  Essential thrombocytosis: -Hydroxyurea on hold since chemotherapy started. -Last platelet count was 472 on 06/01/2019. -We will check platelet count today and if it is going up we will start her back on Hydrea. -If the platelet count is more than 500, we will start her back on Hydrea.  I will reevaluate her in 3 to 4 weeks.  3.  Macrocytic anemia: -Last transfusion on 03/2019. -Last hemoglobin was 9.1 with MCV 103. -Last ferritin was 107 on 03/16/2019.  Percent saturation was 24.  I plan to repeat anemia labs today.  4.  Left flank pain: -She reported left flank pain of several months duration on and off.  It is sharp and at times become dull pain.  No hematuria.  No fevers or chills. -I reviewed MRI which showed scoliosis with multilevel lumbar fusion. -She was told to take tramadol as needed.   Orders placed this encounter:  Orders Placed This Encounter  Procedures  . MR Brain W Wo Contrast  . CT Chest W Contrast  . Iron and TIBC  . Ferritin  . Vitamin B12  . Folate  . CBC with Differential/Platelet  . Lipid panel      Derek Jack, MD [place] [phone]

## 2019-07-09 ENCOUNTER — Encounter: Payer: Self-pay | Admitting: General Practice

## 2019-07-12 NOTE — Progress Notes (Signed)
  Radiation Oncology         [phone] ________________________________  Name: Ashley [name] MRN: [id number]  Date: 07/13/2019  DOB: 01-Jul-1950  SIMULATION AND TREATMENT PLANNING NOTE    ICD-10-CM   1. Small cell lung carcinoma, right (HCC)  C34.91     DIAGNOSIS: Stage IIIA (cT3, cN1, cM0) Small Cell Carcinoma of the RUL  NARRATIVE:  The patient was brought to the [facility].  Identity was confirmed.  All relevant records and images related to the planned course of therapy were reviewed.  The patient freely provided informed written consent to proceed with treatment after reviewing the details related to the planned course of therapy. The consent form was witnessed and verified by the simulation staff.  Then, the patient was set-up in a stable reproducible supine position for radiation therapy.  CT images were obtained.  Surface markings were placed.  The CT images were loaded into the planning software.  Then the target and avoidance structures were contoured.  Treatment planning then occurred.  The radiation prescription was entered and confirmed.  Then, I designed and supervised the construction of a total of 5 medically necessary complex treatment devices.  I have requested : Isodose Plan.  I have ordered:dose calc.  PLAN:  The patient will receive 25 Gy in 10 fractions directed at the whole brain as prophylactic treatment.  -----------------------------------  Blair Promise, PhD, MD  This document serves as a record of services personally performed by Gery Pray, MD. It was created on his behalf by Clerance Lav, a trained medical scribe. The creation of this record is based on the scribe's personal observations and the provider's statements to them. This document has been checked and approved by the attending provider.

## 2019-07-13 ENCOUNTER — Other Ambulatory Visit: Payer: Self-pay

## 2019-07-13 ENCOUNTER — Ambulatory Visit
Admission: RE | Admit: 2019-07-13 | Discharge: 2019-07-13 | Disposition: A | Discharge status: Home / Self Care | Payer: Medicare Other | Source: Ambulatory Visit | Attending: Radiation Oncology | Admitting: Radiation Oncology

## 2019-07-13 DIAGNOSIS — Z510 Encounter for antineoplastic radiation therapy: Secondary | ICD-10-CM | POA: Insufficient documentation

## 2019-07-13 DIAGNOSIS — Z298 Encounter for other specified prophylactic measures: Secondary | ICD-10-CM | POA: Insufficient documentation

## 2019-07-13 DIAGNOSIS — C3491 Malignant neoplasm of unspecified part of right bronchus or lung: Secondary | ICD-10-CM

## 2019-07-13 DIAGNOSIS — C3411 Malignant neoplasm of upper lobe, right bronchus or lung: Secondary | ICD-10-CM | POA: Insufficient documentation

## 2019-07-17 DIAGNOSIS — C3411 Malignant neoplasm of upper lobe, right bronchus or lung: Secondary | ICD-10-CM | POA: Insufficient documentation

## 2019-07-17 DIAGNOSIS — Z510 Encounter for antineoplastic radiation therapy: Secondary | ICD-10-CM | POA: Insufficient documentation

## 2019-07-17 DIAGNOSIS — Z298 Encounter for other specified prophylactic measures: Secondary | ICD-10-CM | POA: Insufficient documentation

## 2019-07-19 NOTE — Progress Notes (Signed)
  Radiation Oncology         [phone] ________________________________  Name: Ashley [name] MRN: [id number]  Date: 07/20/2019  DOB: 1951-03-05  Simulation Verification Note    ICD-10-CM   1. Small cell lung carcinoma, right (HCC)  C34.91     NARRATIVE: The patient was brought to the treatment unit and placed in the planned treatment position. The clinical setup was verified. Then port films were obtained and uploaded to the radiation oncology medical record software.  The treatment beams were carefully compared against the planned radiation fields. The position location and shape of the radiation fields was reviewed. They targeted volume of tissue appears to be appropriately covered by the radiation beams. Organs at risk appear to be excluded as planned.  Based on my personal review, I approved the simulation verification. The patient's treatment will proceed as planned.  -----------------------------------  Blair Promise, PhD, MD  This document serves as a record of services personally performed by Gery Pray, MD. It was created on his behalf by Clerance Lav, a trained medical scribe. The creation of this record is based on the scribe's personal observations and the provider's statements to them. This document has been checked and approved by the attending provider.

## 2019-07-20 ENCOUNTER — Other Ambulatory Visit: Payer: Self-pay

## 2019-07-20 ENCOUNTER — Ambulatory Visit
Admission: RE | Admit: 2019-07-20 | Discharge: 2019-07-20 | Disposition: A | Discharge status: Home / Self Care | Payer: Medicare Other | Source: Ambulatory Visit | Attending: Radiation Oncology | Admitting: Radiation Oncology

## 2019-07-20 DIAGNOSIS — Z510 Encounter for antineoplastic radiation therapy: Secondary | ICD-10-CM | POA: Diagnosis not present

## 2019-07-20 DIAGNOSIS — C3491 Malignant neoplasm of unspecified part of right bronchus or lung: Secondary | ICD-10-CM

## 2019-07-21 ENCOUNTER — Other Ambulatory Visit: Payer: Self-pay

## 2019-07-21 ENCOUNTER — Ambulatory Visit
Admission: RE | Admit: 2019-07-21 | Discharge: 2019-07-21 | Disposition: A | Discharge status: Home / Self Care | Payer: Medicare Other | Source: Ambulatory Visit | Attending: Radiation Oncology | Admitting: Radiation Oncology

## 2019-07-21 DIAGNOSIS — Z510 Encounter for antineoplastic radiation therapy: Secondary | ICD-10-CM | POA: Diagnosis not present

## 2019-07-22 ENCOUNTER — Ambulatory Visit
Admission: RE | Admit: 2019-07-22 | Discharge: 2019-07-22 | Disposition: A | Discharge status: Home / Self Care | Payer: Medicare Other | Source: Ambulatory Visit | Attending: Radiation Oncology | Admitting: Radiation Oncology

## 2019-07-22 ENCOUNTER — Other Ambulatory Visit: Payer: Self-pay

## 2019-07-22 DIAGNOSIS — Z510 Encounter for antineoplastic radiation therapy: Secondary | ICD-10-CM | POA: Diagnosis not present

## 2019-07-23 ENCOUNTER — Ambulatory Visit
Admission: RE | Admit: 2019-07-23 | Discharge: 2019-07-23 | Disposition: A | Discharge status: Home / Self Care | Payer: Medicare Other | Source: Ambulatory Visit | Attending: Radiation Oncology | Admitting: Radiation Oncology

## 2019-07-23 DIAGNOSIS — Z510 Encounter for antineoplastic radiation therapy: Secondary | ICD-10-CM | POA: Diagnosis not present

## 2019-07-24 ENCOUNTER — Ambulatory Visit
Admission: RE | Admit: 2019-07-24 | Discharge: 2019-07-24 | Disposition: A | Discharge status: Home / Self Care | Payer: Medicare Other | Source: Ambulatory Visit | Attending: Radiation Oncology | Admitting: Radiation Oncology

## 2019-07-24 ENCOUNTER — Telehealth: Payer: Self-pay

## 2019-07-24 ENCOUNTER — Other Ambulatory Visit: Payer: Self-pay

## 2019-07-24 ENCOUNTER — Other Ambulatory Visit (HOSPITAL_COMMUNITY): Payer: Self-pay | Admitting: Hematology

## 2019-07-24 DIAGNOSIS — Z510 Encounter for antineoplastic radiation therapy: Secondary | ICD-10-CM | POA: Diagnosis not present

## 2019-07-24 NOTE — Telephone Encounter (Signed)
Left VM for pt on home number re: recent call from pt. Pt had called to report headaches "for the past couple days and I have never had a headache in my life". After discussing call with Dr. Lisbeth Renshaw, Dr. Lisbeth Renshaw recommended pt contact Dr. Tomie China office for evaluation as Dr. Sondra Come is out of office. Main number for RadOnc left for pt for any further questions/concerns. Loma Sousa, RN BSN

## 2019-07-27 ENCOUNTER — Other Ambulatory Visit: Payer: Self-pay

## 2019-07-27 ENCOUNTER — Ambulatory Visit
Admission: RE | Admit: 2019-07-27 | Discharge: 2019-07-27 | Disposition: A | Discharge status: Home / Self Care | Payer: Medicare Other | Source: Ambulatory Visit | Attending: Radiation Oncology | Admitting: Radiation Oncology

## 2019-07-27 DIAGNOSIS — Z510 Encounter for antineoplastic radiation therapy: Secondary | ICD-10-CM | POA: Diagnosis not present

## 2019-07-28 ENCOUNTER — Ambulatory Visit
Admission: RE | Admit: 2019-07-28 | Discharge: 2019-07-28 | Disposition: A | Discharge status: Home / Self Care | Payer: Medicare Other | Source: Ambulatory Visit | Attending: Radiation Oncology | Admitting: Radiation Oncology

## 2019-07-28 ENCOUNTER — Other Ambulatory Visit: Payer: Self-pay | Admitting: Radiation Oncology

## 2019-07-28 DIAGNOSIS — Z510 Encounter for antineoplastic radiation therapy: Secondary | ICD-10-CM | POA: Diagnosis not present

## 2019-07-28 MED ORDER — HYDROCODONE-ACETAMINOPHEN 5-325 MG PO TABS: 1.0000 | ORAL_TABLET | Freq: Three times a day (TID) | ORAL | 0 refills | Status: DC | PRN

## 2019-07-29 ENCOUNTER — Other Ambulatory Visit: Payer: Self-pay

## 2019-07-29 ENCOUNTER — Ambulatory Visit
Admission: RE | Admit: 2019-07-29 | Discharge: 2019-07-29 | Disposition: A | Discharge status: Home / Self Care | Payer: Medicare Other | Source: Ambulatory Visit | Attending: Radiation Oncology | Admitting: Radiation Oncology

## 2019-07-29 DIAGNOSIS — Z510 Encounter for antineoplastic radiation therapy: Secondary | ICD-10-CM | POA: Diagnosis not present

## 2019-07-30 ENCOUNTER — Ambulatory Visit
Admission: RE | Admit: 2019-07-30 | Discharge: 2019-07-30 | Disposition: A | Discharge status: Home / Self Care | Payer: Medicare Other | Source: Ambulatory Visit | Attending: Radiation Oncology | Admitting: Radiation Oncology

## 2019-07-30 DIAGNOSIS — Z510 Encounter for antineoplastic radiation therapy: Secondary | ICD-10-CM | POA: Diagnosis not present

## 2019-07-31 ENCOUNTER — Ambulatory Visit
Admission: RE | Admit: 2019-07-31 | Discharge: 2019-07-31 | Disposition: A | Discharge status: Home / Self Care | Payer: Medicare Other | Source: Ambulatory Visit | Attending: Radiation Oncology | Admitting: Radiation Oncology

## 2019-07-31 ENCOUNTER — Other Ambulatory Visit: Payer: Self-pay

## 2019-07-31 ENCOUNTER — Encounter: Payer: Self-pay | Admitting: Radiation Oncology

## 2019-07-31 DIAGNOSIS — Z510 Encounter for antineoplastic radiation therapy: Secondary | ICD-10-CM | POA: Diagnosis not present

## 2019-08-03 ENCOUNTER — Other Ambulatory Visit: Payer: Self-pay

## 2019-08-03 ENCOUNTER — Other Ambulatory Visit (HOSPITAL_COMMUNITY): Payer: Medicare Other

## 2019-08-03 ENCOUNTER — Other Ambulatory Visit (HOSPITAL_COMMUNITY): Payer: Self-pay | Admitting: Hematology

## 2019-08-03 ENCOUNTER — Inpatient Hospital Stay (HOSPITAL_COMMUNITY): Payer: Medicare Other | Attending: Hematology | Admitting: Hematology

## 2019-08-03 ENCOUNTER — Inpatient Hospital Stay (HOSPITAL_COMMUNITY): Payer: Medicare Other

## 2019-08-03 VITALS — BP 140/81 | HR 72 | Temp 96.9°F | Resp 18

## 2019-08-03 DIAGNOSIS — C3491 Malignant neoplasm of unspecified part of right bronchus or lung: Secondary | ICD-10-CM

## 2019-08-03 DIAGNOSIS — Z7982 Long term (current) use of aspirin: Secondary | ICD-10-CM | POA: Insufficient documentation

## 2019-08-03 DIAGNOSIS — Z87891 Personal history of nicotine dependence: Secondary | ICD-10-CM | POA: Diagnosis not present

## 2019-08-03 DIAGNOSIS — Z853 Personal history of malignant neoplasm of breast: Secondary | ICD-10-CM | POA: Insufficient documentation

## 2019-08-03 DIAGNOSIS — Z90722 Acquired absence of ovaries, bilateral: Secondary | ICD-10-CM | POA: Insufficient documentation

## 2019-08-03 DIAGNOSIS — Z9221 Personal history of antineoplastic chemotherapy: Secondary | ICD-10-CM | POA: Insufficient documentation

## 2019-08-03 DIAGNOSIS — C3411 Malignant neoplasm of upper lobe, right bronchus or lung: Secondary | ICD-10-CM | POA: Diagnosis not present

## 2019-08-03 DIAGNOSIS — M8580 Other specified disorders of bone density and structure, unspecified site: Secondary | ICD-10-CM | POA: Insufficient documentation

## 2019-08-03 DIAGNOSIS — Z90710 Acquired absence of both cervix and uterus: Secondary | ICD-10-CM | POA: Insufficient documentation

## 2019-08-03 DIAGNOSIS — D473 Essential (hemorrhagic) thrombocythemia: Secondary | ICD-10-CM | POA: Diagnosis not present

## 2019-08-03 DIAGNOSIS — Z923 Personal history of irradiation: Secondary | ICD-10-CM | POA: Diagnosis not present

## 2019-08-03 DIAGNOSIS — Z902 Acquired absence of lung [part of]: Secondary | ICD-10-CM | POA: Insufficient documentation

## 2019-08-03 DIAGNOSIS — C50912 Malignant neoplasm of unspecified site of left female breast: Secondary | ICD-10-CM

## 2019-08-03 DIAGNOSIS — I10 Essential (primary) hypertension: Secondary | ICD-10-CM | POA: Diagnosis not present

## 2019-08-03 DIAGNOSIS — Z9012 Acquired absence of left breast and nipple: Secondary | ICD-10-CM | POA: Diagnosis not present

## 2019-08-03 DIAGNOSIS — Z79899 Other long term (current) drug therapy: Secondary | ICD-10-CM | POA: Diagnosis not present

## 2019-08-03 DIAGNOSIS — Z9079 Acquired absence of other genital organ(s): Secondary | ICD-10-CM | POA: Insufficient documentation

## 2019-08-03 DIAGNOSIS — Z85820 Personal history of malignant melanoma of skin: Secondary | ICD-10-CM | POA: Insufficient documentation

## 2019-08-03 DIAGNOSIS — Z170 Estrogen receptor positive status [ER+]: Secondary | ICD-10-CM | POA: Insufficient documentation

## 2019-08-03 LAB — CBC WITH DIFFERENTIAL/PLATELET
Abs Immature Granulocytes: 0.01 10*3/uL (ref 0.00–0.07)
Basophils Absolute: 0 10*3/uL (ref 0.0–0.1)
Basophils Relative: 1 %
Eosinophils Absolute: 0.2 10*3/uL (ref 0.0–0.5)
Eosinophils Relative: 3 %
HCT: 37.9 % (ref 36.0–46.0)
Hemoglobin: 11.7 g/dL — ABNORMAL LOW (ref 12.0–15.0)
Immature Granulocytes: 0 %
Lymphocytes Relative: 12 %
Lymphs Abs: 0.7 10*3/uL (ref 0.7–4.0)
MCH: 28.7 pg (ref 26.0–34.0)
MCHC: 30.9 g/dL (ref 30.0–36.0)
MCV: 92.9 fL (ref 80.0–100.0)
Monocytes Absolute: 0.4 10*3/uL (ref 0.1–1.0)
Monocytes Relative: 8 %
Neutro Abs: 4.3 10*3/uL (ref 1.7–7.7)
Neutrophils Relative %: 76 %
Platelets: 356 10*3/uL (ref 150–400)
RBC: 4.08 MIL/uL (ref 3.87–5.11)
RDW: 13.6 % (ref 11.5–15.5)
WBC: 5.6 10*3/uL (ref 4.0–10.5)
nRBC: 0 % (ref 0.0–0.2)

## 2019-08-03 LAB — COMPREHENSIVE METABOLIC PANEL
ALT: 11 U/L (ref 0–44)
AST: 21 U/L (ref 15–41)
Albumin: 3.4 g/dL — ABNORMAL LOW (ref 3.5–5.0)
Alkaline Phosphatase: 89 U/L (ref 38–126)
Anion gap: 10 (ref 5–15)
BUN: 18 mg/dL (ref 8–23)
CO2: 24 mmol/L (ref 22–32)
Calcium: 8.9 mg/dL (ref 8.9–10.3)
Chloride: 103 mmol/L (ref 98–111)
Creatinine, Ser: 1.13 mg/dL — ABNORMAL HIGH (ref 0.44–1.00)
GFR calc Af Amer: 57 mL/min — ABNORMAL LOW (ref >60)
GFR calc non Af Amer: 49 mL/min — ABNORMAL LOW (ref >60)
Glucose, Bld: 183 mg/dL — ABNORMAL HIGH (ref 70–99)
Potassium: 2.9 mmol/L — ABNORMAL LOW (ref 3.5–5.1)
Sodium: 137 mmol/L (ref 135–145)
Total Bilirubin: 0.4 mg/dL (ref 0.3–1.2)
Total Protein: 6.7 g/dL (ref 6.5–8.1)

## 2019-08-03 MED ORDER — HEPARIN SOD (PORK) LOCK FLUSH 100 UNIT/ML IV SOLN
500.0000 [IU] | Freq: Once | INTRAVENOUS | Status: AC
  Administered 2019-08-03: 500 [IU] via INTRAVENOUS

## 2019-08-03 MED ORDER — SODIUM CHLORIDE 0.9% FLUSH
10.0000 mL | Freq: Once | INTRAVENOUS | Status: AC
  Administered 2019-08-03: 10 mL via INTRAVENOUS

## 2019-08-03 NOTE — Progress Notes (Signed)
[place], [place] [address]   CLINIC:  Medical Oncology/Hematology  PCP:  [name], NP [address] [place] [address] [phone]   REASON FOR VISIT: Small cell lung cancer, essential thrombocytosis.  CURRENT THERAPY:Observation.    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 The 3 liver lesions are not hypermetabolic. The radiologist suspects they may be subtly present on prior CT chest from 10/2013, to further reassuring that these are likely benign lesions.   Melanoma ([place])  07/09/2013 Initial Biopsy   Initial biopsy L upper thigh/buttocks melanoma   07/20/2013 Surgery   Excision L lateral buttock/upper thigh melanoma, clear margins   07/20/2013 Pathology Results   Breslow depth 1.02 mm, pT2a    Small cell lung carcinoma, right ([place])  11/27/2018 Initial Diagnosis   Small cell lung carcinoma, right ([place])   03/09/2019 -  Chemotherapy   The patient had palonosetron (ALOXI) injection 0.25 mg, 0.25 mg, Intravenous,  Once, 4 of 4 cycles Administration: 0.25 mg (03/09/2019), 0.25 mg (03/30/2019), 0.25 mg (04/20/2019), 0.25 mg (05/11/2019) pegfilgrastim (NEULASTA ONPRO KIT) injection 6 mg, 6 mg, Subcutaneous, Once, 1 of 1 cycle Administration: 6 mg (03/11/2019) pegfilgrastim-jmdb (FULPHILA) injection 6 mg, 6 mg, Subcutaneous,  Once, 3 of 3 cycles Administration: 6 mg (04/03/2019), 6 mg (04/24/2019), 6 mg (05/15/2019) CARBOplatin (PARAPLATIN) 330 mg  in sodium chloride 0.9 % 250 mL chemo infusion, 330 mg (100 % of original dose 325.5 mg), Intravenous,  Once, 4 of 4 cycles Dose modification:   (original dose 325.5 mg, Cycle 1),   (original dose 325.5 mg, Cycle 2),   (original dose 309 mg, Cycle 3),   (original dose 325.5 mg, Cycle 4) Administration: 330 mg (03/09/2019), 330 mg (03/30/2019), 310 mg (04/20/2019), 330 mg (05/11/2019) etoposide (VEPESID) 150 mg in sodium chloride 0.9 % 500 mL chemo infusion, 100 mg/m2 = 150 mg, Intravenous,  Once, 4 of 4 cycles Administration: 150 mg (03/09/2019), 150 mg (03/10/2019), 150 mg (03/11/2019), 150 mg (03/30/2019), 150 mg (03/31/2019), 150 mg (04/01/2019), 150 mg (04/20/2019), 150 mg (04/21/2019), 150 mg (04/22/2019), 150 mg (05/11/2019), 150 mg (05/12/2019), 150 mg (05/13/2019) fosaprepitant (EMEND) 150 mg, dexamethasone (DECADRON) 12 mg in sodium chloride 0.9 % 145 mL IVPB, , Intravenous,  Once, 4 of 4 cycles Administration:  (03/09/2019),  (03/30/2019),  (04/20/2019),  (05/11/2019)  for chemotherapy treatment.    03/09/2019 Cancer Staging   Staging form: Lung, AJCC 8th Edition - Clinical: Stage IIIA (cT3, cN1, cM0) - Signed by Derek Jack, MD on 03/09/2019      INTERVAL HISTORY:  Ms. Adderley 69 y.o. female seen for follow-up of small cell lung cancer and essential thrombocytosis.  Appetite is 50%.  Energy levels are low.  Shortness of breath on exertion was reported.  Completed radiation to the brain on 07/31/2018.  Reported some headaches and nausea associated with treatment which subsided.  REVIEW OF SYSTEMS:  Review of Systems  Constitutional: Positive for fatigue.  Respiratory: Positive for shortness of breath.   Gastrointestinal: Positive for constipation and nausea.  All other systems reviewed and are negative.    PAST MEDICAL/SURGICAL HISTORY:  Past Medical History:  Diagnosis Date  . 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 Anginal pain ([place])   . Arthritis   . Ascending aortic  aneurysm ([place])   . Cancer [facility]) 1995   breast, left, mastectomy/chemo  . Chest pain    Possibly cardiac. No evidence of ischemia/injury based upon normal troponin I. Chest discomfort could be tachycardia induced supply demand mismatch.   . CHF (congestive heart failure) ([place]) 11/17/2015   after surgery   . Colon adenomas   . Coronary artery disease   . DJD (degenerative joint disease)   . Dyspnea    with exertion  . Emphysema of lung ([facility]) 11/17/2015  . Essential hypertension, benign   . GERD (gastroesophageal reflux disease)   . History of hiatal hernia   . Hyperlipidemia   . Hypertension   . Melanoma ([place]) 01/09/2016  . Osteopenia   . Palpitations   . Pernicious anemia 03/06/2016  . Pernicious anemia   . Pure hypercholesterolemia   . Raynaud's disease   . Thrombocythemia, essential ([place]) 01/09/2016  . Thrombocytosis (H[facility])    Idiopathic  . Vitamin D deficiency    Past Surgical History:  Procedure Laterality Date  . ABDOMINAL HYSTERECTOMY    . ANTERIOR AND POSTERIOR REPAIR N/A 12/09/2014   Procedure: ANTERIOR (CYSTOCELE) AND POSTERIOR REPAIR (RECTOCELE);  Surgeon: Bjorn Loser, MD;  Location: [facility];  Service: Urology;  Laterality: N/A;  . ANTERIOR CERVICAL DECOMPRESSION/DISCECTOMY FUSION 4 LEVELS Right 10/03/2016   Procedure: ANTERIOR CERVICAL DECOMPRESSION FUSION, CERVICAL 4-5, CERVICAL 5-6, CERVICAL 6-7, CERVICAL 7 TO THORACIC 1 WITH INSTRUMENTATION AND ALLOGRAFT;  Surgeon: Phylliss Bob, MD;  Location: [facility];  Service: Orthopedics;  Laterality: Right;  ANTERIOR CERVICAL DECOMPRESSION FUSION, CERVICAL 4-5, CERVICAL 5-6, CERVICAL 6-7, CERVICAL 7 TO THORACIC 1 WITH INSTRUMENTATION AND ALLOGRAFT; REQUEST 4 HO  . ANTERIOR LAT LUMBAR FUSION Left 11/16/2015   Procedure: LEFT SIDED LATERAL INTERBODY FUSION, LUMBAR 2-3, LUMBAR 3-4, LUMBAR 4-5 WITH INSTRUMENTATION;  Surgeon: Phylliss Bob, MD;  Location: [facility];  Service: Orthopedics;  Laterality: Left;  LEFT SIDED LATEARL INTERBODY  FUSION, LUMBAR 2-3, LUMBAR 3-4, LUMBAR 4-5 WITH INSTRUMENTATION   . APPENDECTOMY    . BACK SURGERY    . BONE MARROW ASPIRATION  07/2012  . BONE MARROW BIOPSY  07/2012  . BREAST SURGERY    . CARDIAC CATHETERIZATION    . CARDIAC CATHETERIZATION N/A 01/20/2016   Procedure: Left Heart Cath and Coronary Angiography;  Surgeon: Burnell Blanks, MD;  Location: [facility];  Service: Cardiovascular;  Laterality: N/A;  . COLONOSCOPY  11/29/2010   Procedure: COLONOSCOPY;  Surgeon: Rogene Houston, MD;  [facility];  Service: Endoscopy;  Laterality: N/A;  . COLONOSCOPY N/A 02/18/2014   Procedure: COLONOSCOPY;  Surgeon: Rogene Houston, MD;  [facility];  Service: Endoscopy;  Laterality: N/A;  1030  . COLONOSCOPY N/A 02/25/2017   Procedure: COLONOSCOPY;  Surgeon: Rogene Houston, MD;  [facility];  Service: Endoscopy;  Laterality: N/A;  10:55  . CYSTOSCOPY N/A 12/09/2014   Procedure: CYSTOSCOPY;  Surgeon: Bjorn Loser, MD;  Location: [facility];  Service: Urology;  Laterality: N/A;  . ESOPHAGEAL DILATION N/A 02/25/2017   Procedure: ESOPHAGEAL DILATION;  Surgeon: Rogene Houston, MD;  [facility];  Service: Endoscopy;  Laterality: N/A;  . ESOPHAGOGASTRODUODENOSCOPY N/A 02/25/2017   Procedure: ESOPHAGOGASTRODUODENOSCOPY (EGD);  Surgeon: Rogene Houston, MD;  [facility];  Service: Endoscopy;  Laterality: N/A;  . IR GENERIC HISTORICAL  01/11/2016   IR RADIOLOGY PERIPHERAL GUIDED IV START 01/11/2016 Saverio Danker, PA-C [facility]  . IR GENERIC HISTORICAL  01/11/2016   IR US GUIDE VASC ACCESS RIGHT 01/11/2016 Saverio Danker, PA-C [facility]  . LYMPH NODE DISSECTION Right 12/30/2018   Procedure: Lymph Node Dissection;  Surgeon: Lajuana Matte, MD;  Location: [facility];  Service: Thoracic;  Laterality: Right;  . MASTECTOMY     left  . OVARIAN CYST SURGERY     x2  . POLYPECTOMY  02/25/2017   Procedure: POLYPECTOMY;  Surgeon: Rogene Houston, MD;  [facility];  Service: Endoscopy;;  . PORTACATH PLACEMENT Left 02/16/2019   Procedure: INSERTION PORT-A-CATH (CATHETER  LEFT SUBCLAVIAN);  Surgeon: Aviva Signs, MD;  Location[facility];  Service: General;  Laterality: Left;  . SALPINGOOPHORECTOMY Bilateral 12/09/2014   Procedure: SALPINGO OOPHORECTOMY;  Surgeon: Servando Salina, MD;  Location: [facility];  Service: Gynecology;  Laterality: Bilateral;  . TUBAL LIGATION    . VAGINAL HYSTERECTOMY N/A 12/09/2014   Procedure: HYSTERECTOMY VAGINAL;  Surgeon: Servando Salina, MD;  Location: [facility];  Service: Gynecology;  Laterality: N/A;  . VIDEO ASSISTED THORACOSCOPY (VATS)/ LOBECTOMY Right 12/30/2018   Procedure: VIDEO ASSISTED THORACOSCOPY (VATS)/RIGHT LOWER LOBE WEDGE RESECTION, RIGHT UPPER LOBECTOMY;  Surgeon: Lajuana Matte, MD;  Location: [facility];  Service: Thoracic;  Laterality: Right;  Marland Kitchen VIDEO BRONCHOSCOPY N/A 12/30/2018   Procedure: VIDEO BRONCHOSCOPY;  Surgeon: Lajuana Matte, MD;  [facility];  Service: Thoracic;  Laterality: N/A;     SOCIAL HISTORY:  Social History   Socioeconomic History  . Marital status: Divorced    Spouse name: Not on file  . Number of children: 2  . Years of education: Not on file  . Highest education level: Not on file  Occupational History  . Not on file  Tobacco Use  . Smoking status: Former Smoker    Packs/day: 0.50    Years: 50.00    Pack years: 25.00    Types: Cigarettes    Quit date: 11/25/2018    Years since quitting: 0.6  . Smokeless tobacco: Never Used  Substance and Sexual Activity  . Alcohol use: Yes    Comment: occasional  . Drug use: No  . Sexual activity: Yes    Birth control/protection: Post-menopausal  Other Topics Concern  . Not on file  Social History Narrative  . Not on file   Social Determinants of Health   Financial Resource Strain:   . Difficulty of Paying Living Expenses:   Food Insecurity:   . Worried About Charity fundraiser  in the Last Year:   . Arboriculturist in the Last Year:   Transportation Needs:   . Film/video editor (Medical):   Marland Kitchen Lack of Transportation (Non-Medical):   Physical Activity:   . Days of Exercise per Week:   . Minutes of Exercise per Session:   Stress:   . Feeling of Stress :   Social Connections:   . Frequency of Communication with Friends and Family:   . Frequency of Social Gatherings with Friends and Family:   . Attends Religious Services:   . Active Member of Clubs or Organizations:   . Attends Archivist Meetings:   Marland Kitchen Marital Status:  Intimate Partner Violence:   . Fear of Current or Ex-Partner:   . Emotionally Abused:   Marland Kitchen Physically Abused:   . Sexually Abused:     FAMILY HISTORY:  Family History  Problem Relation Age of Onset  . Hypertension Mother   . Heart failure Mother   . Congestive Heart Failure Mother   . COPD Mother   . Pernicious anemia Mother   . Cancer Mother        lung  . Hypertension Father   . CAD Father   . Heart attack Father   . Hypertension Sister   . Cancer Other   . Celiac disease Other     CURRENT MEDICATIONS:  Outpatient Encounter Medications as of 08/03/2019  Medication Sig  . aspirin EC 81 MG tablet Take 81 mg by mouth daily.  . Calcium Carb-Cholecalciferol (CALCIUM 600+D) 600-800 MG-UNIT TABS Take 1 tablet by mouth 2 (two) times daily.  Marland Kitchen CARBOPLATIN IV Inject into the vein every 21 ( twenty-one) days.   . Cholecalciferol (VITAMIN D-3) 1000 units CAPS Take 1,000 Units daily by mouth.   . cyanocobalamin (,VITAMIN B-12,) 1000 MCG/ML injection INJECT 1 ML INTO THE MUSCLE ONCE MONTHLY AS DIRECTED. (Patient taking differently: Inject 1,000 mcg into the muscle every 30 (thirty) days. )  . ETOPOSIDE IV Inject into the vein every 21 ( twenty-one) days.  Marland Kitchen gabapentin (NEURONTIN) 600 MG tablet TAKE ONE TABLET BY MOUTH ONCE DAILY.  Marland Kitchen ibandronate (BONIVA) 150 MG tablet Take 150 mg by mouth every 30 (thirty) days.   .  nitroGLYCERIN (NITRODUR - DOSED IN MG/24 HR) 0.1 mg/hr patch Place 0.1 mg onto the skin daily.   . OXYGEN Inhale 3 L into the lungs daily.  . pantoprazole (PROTONIX) 40 MG tablet Take 1 tablet (40 mg total) by mouth 2 (two) times daily.  . rosuvastatin (CRESTOR) 40 MG tablet Take 40 mg at bedtime by mouth.   . [DISCONTINUED] clonazePAM (KLONOPIN) 0.5 MG tablet TAKE ONE TABLET BY MOUTH AT BEDTIME.  Marland Kitchen albuterol (PROVENTIL HFA;VENTOLIN HFA) 108 (90 Base) MCG/ACT inhaler Inhale 2 puffs into the lungs every 6 (six) hours as needed for wheezing or shortness of breath. (Patient not taking: Reported on 05/11/2019)  . guaiFENesin (MUCINEX) 600 MG 12 hr tablet Take 1 tablet (600 mg total) by mouth 2 (two) times daily as needed for cough or to loosen phlegm. (Patient not taking: Reported on 06/16/2019)  . HYDROcodone-acetaminophen (NORCO/VICODIN) 5-325 MG tablet Take 1 tablet by mouth every 8 (eight) hours as needed for moderate pain. (Patient not taking: Reported on 08/03/2019)  . ibuprofen (ADVIL,MOTRIN) 800 MG tablet Take 800 mg every 8 (eight) hours as needed by mouth for moderate pain.  Marland Kitchen ipratropium-albuterol (DUONEB) 0.5-2.5 (3) MG/3ML SOLN Take 3 mLs by nebulization every 6 (six) hours as needed (shortness of breath). (Patient not taking: Reported on 05/11/2019)  . lidocaine-prilocaine (EMLA) cream Apply a small amount to port a cath site and cover with plastic wrap 1 hour prior to chemotherapy appointments (Patient not taking: Reported on 08/03/2019)  . methocarbamol (ROBAXIN) 500 MG tablet Take 500 mg by mouth every 6 (six) hours as needed for muscle spasms.   . ondansetron (ZOFRAN ODT) 8 MG disintegrating tablet Take 1 tablet (8 mg total) by mouth every 8 (eight) hours as needed for nausea or vomiting. (Patient not taking: Reported on 06/01/2019)  . ondansetron (ZOFRAN) 8 MG tablet Take 1 tablet (8 mg total) by mouth every 8 (eight) hours as needed for nausea  or vomiting. (Patient not taking: Reported on  05/11/2019)  . polyethylene glycol (MIRALAX / GLYCOLAX) 17 g packet Take 17 g by mouth daily as needed for mild constipation.  . prochlorperazine (COMPAZINE) 10 MG tablet Take 1 tablet (10 mg total) by mouth every 6 (six) hours as needed (Nausea or vomiting). (Patient not taking: Reported on 05/11/2019)  . traMADol (ULTRAM) 50 MG tablet Take 50 mg by mouth every 6 (six) hours as needed.   No facility-administered encounter medications on file as of 08/03/2019.    ALLERGIES:  Allergies  Allergen Reactions  . Penicillins Hives and Rash    Did it involve swelling of the face/tongue/throat, SOB, or low BP? No Did it involve sudden or severe rash/hives, skin peeling, or any reaction on the inside of your mouth or nose? No Did you need to seek medical attention at a hospital or doctor's office? No When did it last happen?5-10 year If all above answers are "NO", may proceed with cephalosporin use.    . Tape Rash    Medipore, Coban, and paper tape CAN be tolerated     PHYSICAL EXAM:  ECOG Performance status: 1  Vitals:   08/03/19 1045  BP: 140/81  Pulse: 72  Resp: 18  Temp: (!) 96.9 F (36.1 C)  SpO2: 98%   There were no vitals filed for this visit.  Physical Exam Constitutional:      Appearance: Normal appearance. She is normal weight.  Cardiovascular:     Rate and Rhythm: Normal rate and regular rhythm.     Heart sounds: Normal heart sounds.  Pulmonary:     Effort: Pulmonary effort is normal.     Breath sounds: Normal breath sounds.  Abdominal:     General: Abdomen is flat. There is no distension.     Palpations: Abdomen is soft. There is no mass.  Musculoskeletal:        General: Normal range of motion.  Skin:    General: Skin is warm and dry.  Neurological:     Mental Status: She is alert and oriented to person, place, and time. Mental status is at baseline.  Psychiatric:        Mood and Affect: Mood normal.        Behavior: Behavior normal.        Thought  Content: Thought content normal.        Judgment: Judgment normal.      LABORATORY DATA:  I have reviewed the labs as listed.  CBC    Component Value Date/Time   WBC 5.6 08/03/2019 1042   RBC 4.08 08/03/2019 1042   HGB 11.7 (L) 08/03/2019 1042   HCT 37.9 08/03/2019 1042   PLT 356 08/03/2019 1042   MCV 92.9 08/03/2019 1042   MCH 28.7 08/03/2019 1042   MCHC 30.9 08/03/2019 1042   RDW 13.6 08/03/2019 1042   LYMPHSABS 0.7 08/03/2019 1042   MONOABS 0.4 08/03/2019 1042   EOSABS 0.2 08/03/2019 1042   BASOSABS 0.0 08/03/2019 1042   CMP Latest Ref Rng & Units 08/03/2019 06/01/2019 05/11/2019  Glucose 70 - 99 mg/dL 183(H) 105(H) 112(H)  BUN 8 - 23 mg/dL '18 20 17  ' Creatinine 0.44 - 1.00 mg/dL 1.13(H) 0.98 0.97  Sodium 135 - 145 mmol/L 137 136 135  Potassium 3.5 - 5.1 mmol/L 2.9(L) 3.6 3.8  Chloride 98 - 111 mmol/L 103 104 102  CO2 22 - 32 mmol/L '24 23 24  ' Calcium 8.9 - 10.3 mg/dL 8.9 8.8(L) 8.8(L)  Total Protein 6.5 - 8.1 g/dL 6.7 7.1 6.4(L)  Total Bilirubin 0.3 - 1.2 mg/dL 0.4 0.5 0.6  Alkaline Phos 38 - 126 U/L 89 98 95  AST 15 - 41 U/L '21 17 17  ' ALT 0 - 44 U/L '11 10 11       ' DIAGNOSTIC IMAGING:  I have reviewed scans.     ASSESSMENT & PLAN:   Small cell lung carcinoma, right ([facility]) 1.  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  Right chest wall biopsy consistent with small cell carcinoma. -30 Gy of radiation in 10 fractions from 01/28/2019 through 02/10/2019. -PET scan on 01/26/2019 shows dense consolidative airspace disease in the posterior right base, markedly hypermetabolic SUV 8.  Loculated anterior pleural fluid in the right hemithorax with adjacent airspace consolidation is hypermetabolic with SUV 4.1.  Hypermetabolic uptake identified along the right lung staple lines.  2.5 cm focus  of the soft tissue in the right axilla with SUV 2.2.  Precarinal lymph node, short Axis IX millimeters with SUV 2.5.  12 mm short axis subcarinal lymph node with SUV 3. -CT of the chest on 05/27/2019 showed improved right middle lobe consolidation.  1.6 cm possible left hepatic lesion. -MRI of the abdomen on 06/26/2019 showed normal left hepatic lobe lesion.  There is hemosiderin deposition in the liver as well as adrenals. -She completed prophylactic cranial irradiation on 07/31/2019. -She had some headaches and nausea which improved at this time. -We will schedule her for follow-up CT scan in 6 weeks.  2.  Essential thrombocytosis: -Hydroxyurea on hold since chemotherapy for lung cancer. -Platelet count today is 356.  No need to start Hydrea at this time.  We will repeat labs in 6 weeks..  3.  Macrocytic anemia: -This has resolved.  Hemoglobin today is 11.7.     Orders placed this encounter:  Orders Placed This Encounter  Procedures  . CBC with Differential/Platelet  . Comprehensive metabolic panel  . Magnesium  . Lactate dehydrogenase      Derek Jack, MD [place] [phone]

## 2019-08-03 NOTE — Patient Instructions (Addendum)
[place] at [facility] Discharge Instructions  You were seen today by Dr. Delton Coombes. He went over your recent lab results. Continue to hold Hydrea He will see you back in 6 weeks as scheduled for labs and follow up.   Thank you for choosing [place] at [facility] to provide your oncology and hematology care.  To afford each patient quality time with our provider, please arrive at least 15 minutes before your scheduled appointment time.   If you have a lab appointment with the [facility] please come in thru the  Main Entrance and check in at the main information desk  You need to re-schedule your appointment should you arrive 10 or more minutes late.  We strive to give you quality time with our providers, and arriving late affects you and other patients whose appointments are after yours.  Also, if you no show three or more times for appointments you may be dismissed from the clinic at the providers discretion.     Again, thank you for choosing [facility].  Our hope is that these requests will decrease the amount of time that you wait before being seen by our physicians.       _____________________________________________________________  Should you have questions after your visit to [facility], please contact our office at [phone] between the hours of 8:00 a.m. and 4:30 p.m.  Voicemails left after 4:00 p.m. will not be returned until the following business day.  For prescription refill requests, have your pharmacy contact our office and allow 72 hours.    Cancer Center Support Programs:   > Cancer Support Group  2nd Tuesday of the month 1pm-2pm, Journey Room

## 2019-08-03 NOTE — Progress Notes (Signed)
Labs from port. Patients port flushed without difficulty.  Good blood return noted with no bruising or swelling noted at site.  Band aid applied.  VSS with discharge and left ambulatory with no s/s of distress noted.

## 2019-08-03 NOTE — Assessment & Plan Note (Signed)
1.  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  Right chest wall biopsy consistent with small cell carcinoma. -30 Gy of radiation in 10 fractions from 01/28/2019 through 02/10/2019. -PET scan on 01/26/2019 shows dense consolidative airspace disease in the posterior right base, markedly hypermetabolic SUV 8.  Loculated anterior pleural fluid in the right hemithorax with adjacent airspace consolidation is hypermetabolic with SUV 4.1.  Hypermetabolic uptake identified along the right lung staple lines.  2.5 cm focus of the soft tissue in the right axilla with SUV 2.2.  Precarinal lymph node, short Axis IX millimeters with SUV 2.5.  12 mm short axis subcarinal lymph node with SUV 3. -CT of the chest on 05/27/2019 showed improved right middle lobe consolidation.  1.6 cm possible left hepatic lesion. -MRI of the abdomen on 06/26/2019 showed normal left hepatic lobe lesion.  There is hemosiderin deposition in the liver as well as adrenals. -She completed prophylactic cranial irradiation on 07/31/2019. -She had some headaches and nausea which improved at this time. -We will schedule her for follow-up CT scan in 6 weeks.  2.  Essential thrombocytosis: -Hydroxyurea on hold since chemotherapy for lung cancer. -Platelet count today is 356.  No need to start Hydrea at this time.  We will repeat labs in 6 weeks..  3.  Macrocytic anemia: -This has resolved.  Hemoglobin today is 11.7.

## 2019-08-03 NOTE — Progress Notes (Incomplete)
  Patient Name: Ashley [name] MRN: [id number] DOB: May 27, 1950 Referring Physician: Derek Jack Date of Service: 07/31/2019 [facility], [place]                                                        End Of Treatment Note  Diagnoses: C34.11-Malignant neoplasm of upper lobe, right bronchus or lung  Cancer Staging: StageIIIA (cT3, cN1, cM0) Small Cell Carcinoma of the RUL  Intent: Curative  Radiation Treatment Dates: 07/20/2019 through 07/31/2019 Site Technique Total Dose (Gy) Dose per Fx (Gy) Completed Fx Beam Energies  Brain: Brain Complex 25/25 2.5 10/10 6X   Narrative: The patient tolerated radiation therapy relatively well. She underwent prophylactic cranial irradition. On 07/28/2019, the patient reported constant, severe headaches with associated nausea and poor appetite. Zofran did not relieve the nausea. She was intolerant of steroids and did not wish to consider that for her headaches. In light of that, she was prescribed a limited course of Hydrocodone that she had taken in the past and tolerated well. On examination, the oral cavity was free of secondary infection.  Plan: The patient will follow-up with radiation oncology in one month.  ________________________________________________   Blair Promise, PhD, MD  This document serves as a record of services personally performed by Gery Pray, MD. It was created on his behalf by Clerance Lav, a trained medical scribe. The creation of this record is based on the scribe's personal observations and the provider's statements to them. This document has been checked and approved by the attending provider.

## 2019-08-04 ENCOUNTER — Other Ambulatory Visit: Payer: Medicare Other

## 2019-08-05 ENCOUNTER — Other Ambulatory Visit: Payer: Self-pay | Admitting: Radiation Oncology

## 2019-08-05 ENCOUNTER — Other Ambulatory Visit: Payer: Medicare Other

## 2019-08-05 ENCOUNTER — Inpatient Hospital Stay: Admission: RE | Admit: 2019-08-05 | Payer: Medicare Other | Source: Ambulatory Visit

## 2019-08-05 MED ORDER — DEXAMETHASONE 4 MG PO TABS: 4.0000 mg | ORAL_TABLET | Freq: Two times a day (BID) | ORAL | 0 refills | Status: DC

## 2019-08-12 ENCOUNTER — Other Ambulatory Visit (HOSPITAL_COMMUNITY): Payer: Self-pay | Admitting: Hematology

## 2019-08-19 ENCOUNTER — Other Ambulatory Visit: Payer: Self-pay | Admitting: Thoracic Surgery (Cardiothoracic Vascular Surgery)

## 2019-08-19 DIAGNOSIS — C3491 Malignant neoplasm of unspecified part of right bronchus or lung: Secondary | ICD-10-CM

## 2019-08-21 ENCOUNTER — Ambulatory Visit (INDEPENDENT_AMBULATORY_CARE_PROVIDER_SITE_OTHER): Payer: Medicare Other | Admitting: Thoracic Surgery (Cardiothoracic Vascular Surgery)

## 2019-08-21 ENCOUNTER — Other Ambulatory Visit: Payer: Self-pay

## 2019-08-21 ENCOUNTER — Encounter: Payer: Self-pay | Admitting: Thoracic Surgery (Cardiothoracic Vascular Surgery)

## 2019-08-21 ENCOUNTER — Ambulatory Visit
Admission: RE | Admit: 2019-08-21 | Discharge: 2019-08-21 | Disposition: A | Discharge status: Home / Self Care | Payer: Medicare Other | Source: Ambulatory Visit | Attending: Thoracic Surgery (Cardiothoracic Vascular Surgery) | Admitting: Thoracic Surgery (Cardiothoracic Vascular Surgery)

## 2019-08-21 VITALS — BP 127/84 | HR 66 | Temp 97.9°F | Resp 20 | Ht 62.0 in | Wt 107.0 lb

## 2019-08-21 DIAGNOSIS — C3491 Malignant neoplasm of unspecified part of right bronchus or lung: Secondary | ICD-10-CM

## 2019-08-21 NOTE — Progress Notes (Signed)
     [address]       [place] [address]             [phone]       Ms. Brucker comes in for her 21-month follow-up appointment.  She recently completed whole brain radiation.  She is also been followed very closely by Dr. Raliegh Ip in regards to management of her small cell lung cancer.  She denies any shortness of breath or chest pain, and appears to be recovering well from all of her adjuvant therapy.  Today's Vitals   08/21/19 1341  BP: 127/84  Pulse: 66  Resp: 20  Temp: 97.9 F (36.6 C)  TempSrc: Temporal  SpO2: 97%  Weight: 107 lb (48.5 kg)  Height: 5\' 2"  (1.575 m)   Body mass index is 19.57 kg/m. Alert NAD, very frail-appearing Gait instability with ambulation. Easy work of breathing Regular rate and rhythm.  69 year old female status post right VATS right upper lobectomy for a 4 .2 cm small cell lung cancer in September 2020.  Currently completed adjuvant therapy. She will follow up with medical oncology for all of her surveillance. Follow-up as needed

## 2019-08-28 NOTE — Progress Notes (Signed)
Patient is here today to see Dr. Sondra Come for a 1 month f/u.  Pain: No  Fatigue: Moderate  Cough: No  Shortness of breath:yes  Difficulty swallowing: Yes d ue to neck surgery  Skin irritation: dry skin at jaw line  Also getting chemo/immunotherapy? Last tx? No  O2 sats  Wt. loss

## 2019-08-31 ENCOUNTER — Ambulatory Visit
Admission: RE | Admit: 2019-08-31 | Discharge: 2019-08-31 | Disposition: A | Discharge status: Home / Self Care | Payer: Medicare Other | Source: Ambulatory Visit | Attending: Radiation Oncology | Admitting: Radiation Oncology

## 2019-08-31 ENCOUNTER — Other Ambulatory Visit: Payer: Self-pay

## 2019-08-31 DIAGNOSIS — Z7982 Long term (current) use of aspirin: Secondary | ICD-10-CM | POA: Diagnosis not present

## 2019-08-31 DIAGNOSIS — C3411 Malignant neoplasm of upper lobe, right bronchus or lung: Secondary | ICD-10-CM | POA: Insufficient documentation

## 2019-08-31 DIAGNOSIS — Z923 Personal history of irradiation: Secondary | ICD-10-CM | POA: Diagnosis not present

## 2019-08-31 DIAGNOSIS — C3491 Malignant neoplasm of unspecified part of right bronchus or lung: Secondary | ICD-10-CM

## 2019-08-31 DIAGNOSIS — Z79899 Other long term (current) drug therapy: Secondary | ICD-10-CM | POA: Diagnosis not present

## 2019-08-31 DIAGNOSIS — R1310 Dysphagia, unspecified: Secondary | ICD-10-CM | POA: Diagnosis not present

## 2019-08-31 DIAGNOSIS — R51 Headache with orthostatic component, not elsewhere classified: Secondary | ICD-10-CM | POA: Insufficient documentation

## 2019-08-31 DIAGNOSIS — Z298 Encounter for other specified prophylactic measures: Secondary | ICD-10-CM | POA: Diagnosis not present

## 2019-08-31 DIAGNOSIS — L853 Xerosis cutis: Secondary | ICD-10-CM | POA: Insufficient documentation

## 2019-08-31 NOTE — Progress Notes (Signed)
Radiation Oncology         [phone] ________________________________  Name: Ashley [name] MRN: [id number]  Date: 08/31/2019  DOB: 13-May-1950  Follow-Up Visit Note  CC: [name], NP  Derek Jack, MD    ICD-10-CM   1. Small cell lung carcinoma, right (HCC)  C34.91     Diagnosis: StageIIIA (cT3, cN1, cM0) Small Cell Carcinoma of the RUL  Interval Since Last Radiation: One month and one day.(Prophylactic treatment)  Radiation Treatment Dates: 07/20/2019 through 07/31/2019 Site Technique Total Dose (Gy) Dose per Fx (Gy) Completed Fx Beam Energies  Brain: Brain Complex 25/25 2.5 10/10 6X    Narrative:  The patient returns today for routine follow-up. The patient was last seen by Dr. 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 She remains under observation. Plan for follow-up CT scan in two weeks.   On review of systems, she reports moderate fatigue, shortness of breath, dry skin along her jaw line, and difficulty swallowing secondary to neck surgery. She denies pain and cough.  ALLERGIES:  is allergic to penicillins and tape.  Meds: Current Outpatient Medications  Medication Sig Dispense Refill  . rizatriptan (MAXALT) 5 MG tablet SMARTSIG:1-2 Tablet(s) By Mouth 1 to 2 Times Daily PRN    . albuterol (PROVENTIL HFA;VENTOLIN HFA) 108 (90 Base) MCG/ACT inhaler Inhale 2 puffs into the lungs every 6 (six) hours as needed for wheezing or shortness of breath. 1 Inhaler 2  . aspirin EC 81 MG tablet Take 81 mg by mouth daily.    . Calcium Carb-Cholecalciferol (CALCIUM 600+D) 600-800 MG-UNIT TABS Take 1 tablet by mouth 2 (two) times daily.    Marland Kitchen CARBOPLATIN IV Inject into the vein every 21 ( twenty-one) days.     . Cholecalciferol (VITAMIN D-3) 1000 units CAPS Take 1,000 Units daily by mouth.     . clonazePAM (KLONOPIN) 0.5 MG tablet TAKE ONE TABLET BY MOUTH AT BEDTIME. 90 tablet 0  .  cyanocobalamin (,VITAMIN B-12,) 1000 MCG/ML injection INJECT 1 ML INTO THE MUSCLE ONCE MONTHLY AS DIRECTED. (Patient taking differently: Inject 1,000 mcg into the muscle every 30 (thirty) days. ) 1 mL 5  . dexamethasone (DECADRON) 4 MG tablet Take 1 tablet (4 mg total) by mouth 2 (two) times daily with a meal. 30 tablet 0  . ETOPOSIDE IV Inject into the vein every 21 ( twenty-one) days.    Marland Kitchen gabapentin (NEURONTIN) 600 MG tablet TAKE ONE TABLET BY MOUTH ONCE DAILY. 30 tablet 0  . guaiFENesin (MUCINEX) 600 MG 12 hr tablet Take 1 tablet (600 mg total) by mouth 2 (two) times daily as needed for cough or to loosen phlegm.    Marland Kitchen HYDROcodone-acetaminophen (NORCO/VICODIN) 5-325 MG tablet Take 1 tablet by mouth every 8 (eight) hours as needed for moderate pain. 30 tablet 0  . ibandronate (BONIVA) 150 MG tablet Take 150 mg by mouth every 30 (thirty) days.     Marland Kitchen ibuprofen (ADVIL,MOTRIN) 800 MG tablet Take 800 mg every 8 (eight) hours as needed by mouth for moderate pain.    Marland Kitchen ipratropium-albuterol (DUONEB) 0.5-2.5 (3) MG/3ML SOLN Take 3 mLs by nebulization every 6 (six) hours as needed (shortness of breath). 360 mL 0  . lidocaine-prilocaine (EMLA) cream Apply a small amount to port a cath site and cover with plastic wrap 1 hour prior to chemotherapy appointments 30 g 3  . methocarbamol (ROBAXIN) 500 MG tablet Take 500 mg  by mouth every 6 (six) hours as needed for muscle spasms.     . nitroGLYCERIN (NITRODUR - DOSED IN MG/24 HR) 0.1 mg/hr patch Place 0.1 mg onto the skin daily.     . ondansetron (ZOFRAN ODT) 8 MG disintegrating tablet Take 1 tablet (8 mg total) by mouth every 8 (eight) hours as needed for nausea or vomiting. 30 tablet 2  . ondansetron (ZOFRAN) 8 MG tablet Take 1 tablet (8 mg total) by mouth every 8 (eight) hours as needed for nausea or vomiting. 20 tablet 5  . OXYGEN Inhale 3 L into the lungs daily.    . pantoprazole (PROTONIX) 40 MG tablet TAKE ONE TABLET BY MOUTH TWICE DAILY. 60 tablet 0  .  polyethylene glycol (MIRALAX / GLYCOLAX) 17 g packet Take 17 g by mouth daily as needed for mild constipation.    . prochlorperazine (COMPAZINE) 10 MG tablet Take 1 tablet (10 mg total) by mouth every 6 (six) hours as needed (Nausea or vomiting). 30 tablet 1  . rosuvastatin (CRESTOR) 40 MG tablet Take 40 mg at bedtime by mouth.     . traMADol (ULTRAM) 50 MG tablet Take 50 mg by mouth every 6 (six) hours as needed.     No current facility-administered medications for this encounter.    Physical Findings: The patient is in no acute distress. Patient is alert and oriented.  height is 5\' 2"  (1.575 m) and weight is 103 lb 9.6 oz (47 kg). Her temperature is 97.8 F (36.6 C). Her blood pressure is 125/84 and her pulse is 95. Her respiration is 20 and oxygen saturation is 100%.   No significant changes. Lungs are clear to auscultation bilaterally. Heart has regular rate and rhythm. No palpable cervical, supraclavicular, or axillary adenopathy. Abdomen soft, non-tender, normal bowel sounds.  Neurological exam nonfocal  Lab Findings: Lab Results  Component Value Date   WBC 5.6 08/03/2019   HGB 11.7 (L) 08/03/2019   HCT 37.9 08/03/2019   MCV 92.9 08/03/2019   PLT 356 08/03/2019    Radiographic Findings: DG Chest 2 View  Result Date: 08/21/2019 CLINICAL DATA:  History of small cell lung cancer. EXAM: CHEST - 2 VIEW COMPARISON:  February 16, 2019. FINDINGS: Stable cardiomediastinal silhouette. Left subclavian Port-A-Cath is noted with distal tip in expected position of the SVC. Left lung is clear. No pneumothorax is noted. Stable right midlung and basilar opacities are noted most consistent with scarring. Bony thorax is unremarkable. IMPRESSION: Stable right lung scarring. No acute cardiopulmonary abnormality seen. Electronically Signed   By: Marijo Conception M.D.   On: 08/21/2019 13:44    Impression: StageIIIA (cT3, cN1, cM0) Small Cell Carcinoma of the RUL  The patient is recovering from the  effects of radiation.  She is experiencing headaches and nausea with her treatment.  Given her problems with steroids she avoid steroids for the initial part of her treatment however given her symptoms agree to do therapy which helped her symptoms significantly.  Denies any further problems with nausea or headaches.  She does have some mild blurring of vision but no double vision.  Plan: CT of chest and MRI of brain scheduled for 09/15/2019. She is scheduled to follow-up with Dr. Delton Coombes on 09/17/2019. She will follow-up with radiation oncology in as-needed basis in light of her continued close follow-up with medical oncology..  ____________________________________   Blair Promise, PhD, MD  This document serves as a record of services personally performed by Gery Pray, MD. It was  created on his behalf by Clerance Lav, a trained medical scribe. The creation of this record is based on the scribe's personal observations and the provider's statements to them. This document has been checked and approved by the attending provider.

## 2019-09-15 ENCOUNTER — Ambulatory Visit
Admission: RE | Admit: 2019-09-15 | Discharge: 2019-09-15 | Disposition: A | Discharge status: Home / Self Care | Payer: Medicare Other | Source: Ambulatory Visit | Attending: Hematology | Admitting: Hematology

## 2019-09-15 DIAGNOSIS — Z853 Personal history of malignant neoplasm of breast: Secondary | ICD-10-CM

## 2019-09-15 DIAGNOSIS — R918 Other nonspecific abnormal finding of lung field: Secondary | ICD-10-CM

## 2019-09-15 DIAGNOSIS — C3491 Malignant neoplasm of unspecified part of right bronchus or lung: Secondary | ICD-10-CM

## 2019-09-15 DIAGNOSIS — R9389 Abnormal findings on diagnostic imaging of other specified body structures: Secondary | ICD-10-CM

## 2019-09-15 DIAGNOSIS — C50912 Malignant neoplasm of unspecified site of left female breast: Secondary | ICD-10-CM

## 2019-09-15 MED ORDER — HEPARIN SOD (PORK) LOCK FLUSH 100 UNIT/ML IV SOLN
500.0000 [IU] | Freq: Once | INTRAVENOUS | Status: AC
  Administered 2019-09-15: 500 [IU] via INTRAVENOUS

## 2019-09-15 MED ORDER — IOPAMIDOL (ISOVUE-300) INJECTION 61%
75.0000 mL | Freq: Once | INTRAVENOUS | Status: AC | PRN
  Administered 2019-09-15: 75 mL via INTRAVENOUS

## 2019-09-15 MED ORDER — GADOBENATE DIMEGLUMINE 529 MG/ML IV SOLN
9.0000 mL | Freq: Once | INTRAVENOUS | Status: AC | PRN
  Administered 2019-09-15: 9 mL via INTRAVENOUS

## 2019-09-17 ENCOUNTER — Inpatient Hospital Stay (HOSPITAL_COMMUNITY): Payer: Medicare Other | Attending: Hematology | Admitting: Hematology

## 2019-09-17 ENCOUNTER — Encounter (HOSPITAL_COMMUNITY): Payer: Self-pay | Admitting: Hematology

## 2019-09-17 ENCOUNTER — Inpatient Hospital Stay (HOSPITAL_COMMUNITY): Payer: Medicare Other

## 2019-09-17 VITALS — BP 126/88 | HR 89 | Temp 97.1°F | Resp 19 | Wt 98.2 lb

## 2019-09-17 DIAGNOSIS — C779 Secondary and unspecified malignant neoplasm of lymph node, unspecified: Secondary | ICD-10-CM | POA: Diagnosis not present

## 2019-09-17 DIAGNOSIS — C3411 Malignant neoplasm of upper lobe, right bronchus or lung: Secondary | ICD-10-CM | POA: Insufficient documentation

## 2019-09-17 DIAGNOSIS — Z87891 Personal history of nicotine dependence: Secondary | ICD-10-CM | POA: Insufficient documentation

## 2019-09-17 DIAGNOSIS — R5383 Other fatigue: Secondary | ICD-10-CM | POA: Insufficient documentation

## 2019-09-17 DIAGNOSIS — C3491 Malignant neoplasm of unspecified part of right bronchus or lung: Secondary | ICD-10-CM

## 2019-09-17 DIAGNOSIS — D539 Nutritional anemia, unspecified: Secondary | ICD-10-CM | POA: Diagnosis not present

## 2019-09-17 DIAGNOSIS — D473 Essential (hemorrhagic) thrombocythemia: Secondary | ICD-10-CM | POA: Insufficient documentation

## 2019-09-17 DIAGNOSIS — R911 Solitary pulmonary nodule: Secondary | ICD-10-CM | POA: Diagnosis not present

## 2019-09-17 DIAGNOSIS — R519 Headache, unspecified: Secondary | ICD-10-CM | POA: Insufficient documentation

## 2019-09-17 LAB — COMPREHENSIVE METABOLIC PANEL
ALT: 11 U/L (ref 0–44)
AST: 18 U/L (ref 15–41)
Albumin: 3.5 g/dL (ref 3.5–5.0)
Alkaline Phosphatase: 73 U/L (ref 38–126)
Anion gap: 10 (ref 5–15)
BUN: 13 mg/dL (ref 8–23)
CO2: 26 mmol/L (ref 22–32)
Calcium: 8.8 mg/dL — ABNORMAL LOW (ref 8.9–10.3)
Chloride: 100 mmol/L (ref 98–111)
Creatinine, Ser: 0.87 mg/dL (ref 0.44–1.00)
GFR calc Af Amer: >60 mL/min (ref >60)
GFR calc non Af Amer: >60 mL/min (ref >60)
Glucose, Bld: 134 mg/dL — ABNORMAL HIGH (ref 70–99)
Potassium: 3.1 mmol/L — ABNORMAL LOW (ref 3.5–5.1)
Sodium: 136 mmol/L (ref 135–145)
Total Bilirubin: 0.6 mg/dL (ref 0.3–1.2)
Total Protein: 6.8 g/dL (ref 6.5–8.1)

## 2019-09-17 LAB — CBC WITH DIFFERENTIAL/PLATELET
Abs Immature Granulocytes: 0.02 10*3/uL (ref 0.00–0.07)
Basophils Absolute: 0 10*3/uL (ref 0.0–0.1)
Basophils Relative: 0 %
Eosinophils Absolute: 0.1 10*3/uL (ref 0.0–0.5)
Eosinophils Relative: 1 %
HCT: 38.7 % (ref 36.0–46.0)
Hemoglobin: 12.3 g/dL (ref 12.0–15.0)
Immature Granulocytes: 0 %
Lymphocytes Relative: 15 %
Lymphs Abs: 1.2 10*3/uL (ref 0.7–4.0)
MCH: 27.5 pg (ref 26.0–34.0)
MCHC: 31.8 g/dL (ref 30.0–36.0)
MCV: 86.6 fL (ref 80.0–100.0)
Monocytes Absolute: 0.6 10*3/uL (ref 0.1–1.0)
Monocytes Relative: 7 %
Neutro Abs: 6.3 10*3/uL (ref 1.7–7.7)
Neutrophils Relative %: 77 %
Platelets: 473 10*3/uL — ABNORMAL HIGH (ref 150–400)
RBC: 4.47 MIL/uL (ref 3.87–5.11)
RDW: 15.3 % (ref 11.5–15.5)
WBC: 8.2 10*3/uL (ref 4.0–10.5)
nRBC: 0 % (ref 0.0–0.2)

## 2019-09-17 LAB — LACTATE DEHYDROGENASE: LDH: 113 U/L (ref 98–192)

## 2019-09-17 LAB — MAGNESIUM: Magnesium: 1.8 mg/dL (ref 1.7–2.4)

## 2019-09-17 MED ORDER — SODIUM CHLORIDE 0.9% FLUSH
10.0000 mL | INTRAVENOUS | Status: DC | PRN
  Administered 2019-09-17: 10 mL via INTRAVENOUS

## 2019-09-17 MED ORDER — HEPARIN SOD (PORK) LOCK FLUSH 100 UNIT/ML IV SOLN
500.0000 [IU] | Freq: Once | INTRAVENOUS | Status: AC
  Administered 2019-09-17: 500 [IU] via INTRAVENOUS

## 2019-09-17 NOTE — Progress Notes (Signed)
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 The 3 liver lesions are not hypermetabolic. The radiologist suspects they may be subtly present on prior CT chest from 10/2013, to further reassuring that these are likely benign lesions.   Melanoma ([facility])  07/09/2013 Initial Biopsy   Initial biopsy L upper thigh/buttocks melanoma   07/20/2013 Surgery   Excision L lateral buttock/upper thigh melanoma, clear margins   07/20/2013 Pathology Results   Breslow depth 1.02 mm, pT2a    Small cell lung carcinoma, right ([facility])  11/27/2018 Initial Diagnosis   Small cell lung carcinoma, right ([facility])   03/09/2019 -  Chemotherapy   The patient had palonosetron (ALOXI) injection 0.25 mg, 0.25 mg, Intravenous,  Once, 4 of 4 cycles Administration: 0.25 mg (03/09/2019), 0.25 mg (03/30/2019), 0.25 mg (04/20/2019), 0.25 mg (05/11/2019) pegfilgrastim (NEULASTA ONPRO KIT) injection 6 mg, 6 mg, Subcutaneous, Once, 1 of 1 cycle Administration: 6 mg (03/11/2019) pegfilgrastim-jmdb (FULPHILA) injection 6 mg, 6 mg, Subcutaneous,  Once, 3 of 3 cycles Administration: 6 mg (04/03/2019), 6 mg (04/24/2019), 6 mg (05/15/2019) CARBOplatin  (PARAPLATIN) 330 mg in sodium chloride 0.9 % 250 mL chemo infusion, 330 mg (100 % of original dose 325.5 mg), Intravenous,  Once, 4 of 4 cycles Dose modification:   (original dose 325.5 mg, Cycle 1),   (original dose 325.5 mg, Cycle 2),   (original dose 309 mg, Cycle 3),   (original dose 325.5 mg, Cycle 4) Administration: 330 mg (03/09/2019), 330 mg (03/30/2019), 310 mg (04/20/2019), 330 mg (05/11/2019) etoposide (VEPESID) 150 mg in sodium chloride 0.9 % 500 mL chemo infusion, 100 mg/m2 = 150 mg, Intravenous,  Once, 4 of 4 cycles Administration: 150 mg (03/09/2019), 150 mg (03/10/2019), 150 mg (03/11/2019), 150 mg (03/30/2019), 150 mg (03/31/2019), 150 mg (04/01/2019), 150 mg (04/20/2019), 150 mg (04/21/2019), 150 mg (04/22/2019), 150 mg (05/11/2019), 150 mg (05/12/2019), 150 mg (05/13/2019) fosaprepitant (EMEND) 150 mg, dexamethasone (DECADRON) 12 mg in sodium chloride 0.9 % 145 mL IVPB, , Intravenous,  Once, 4 of 4 cycles Administration:  (03/09/2019),  (03/30/2019),  (04/20/2019),  (05/11/2019)  for chemotherapy treatment.    03/09/2019 Cancer Staging   Staging form: Lung, AJCC 8th Edition - Clinical: Stage IIIA (cT3, cN1, cM0) - Signed by Derek Jack, MD on 03/09/2019     CANCER STAGING: Cancer Staging Small cell lung carcinoma, right ([facility]) Staging form: Lung, AJCC 8th Edition - Clinical: Stage IIIA (cT3, cN1, cM0) - Signed by Derek Jack, MD on 03/09/2019   INTERVAL HISTORY:  [name], a 69 y.o. 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 Hatice was last seen on 08/03/2019.  She reports that she has not been feeling well the last 3 weeks. She is constantly tired and wants to sleep all the time. She continues having neck pain, which is stable over the past several years. She also has headaches, occasionally behind the R eye, then behind the L eye, then at the crown of the head, a consequence of radiation therapy. She reports  having R ear pain along with occasional tinnitus, also a consequence of radiation therapy.   REVIEW OF SYSTEMS:  Review of Systems  Constitutional: Positive for appetite change (severely decreased), fatigue (severe) and unexpected weight change (5 lb decrease).  HENT:   Positive for tinnitus (R ear) and trouble swallowing.   Respiratory: Positive for cough and shortness of breath (w/ exertion).   Cardiovascular: Positive for chest pain.  Gastrointestinal: Positive for constipation and nausea.  Musculoskeletal: Positive for neck pain.  Neurological: Positive for headaches and speech difficulty.  All other systems reviewed and are negative.   PAST MEDICAL/SURGICAL HISTORY:  Past Medical History:  Diagnosis Date  . 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 Anginal pain ([facility])   . Arthritis   . Ascending aortic aneurysm ([facility])   . Cancer [facility]) 1995   breast, left, mastectomy/chemo  . Chest pain    Possibly cardiac. No evidence of ischemia/injury based upon normal troponin I. Chest discomfort could be tachycardia induced supply demand mismatch.   . CHF (congestive heart failure) ([facility]) 11/17/2015   after surgery   . Colon adenomas   . Coronary artery disease   . DJD (degenerative joint disease)   . Dyspnea    with exertion  . Emphysema of lung ([facility]) 11/17/2015  . Essential hypertension, benign   . GERD (gastroesophageal reflux disease)   . History of hiatal hernia   . Hyperlipidemia   . Hypertension   . Melanoma ([facility]) 01/09/2016  . Osteopenia   . Palpitations   . Pernicious anemia 03/06/2016  . Pernicious anemia   . Pure hypercholesterolemia   . Raynaud's disease   . Thrombocythemia, essential ([facility]) 01/09/2016  . Thrombocytosis ([facility])    Idiopathic  . Vitamin D deficiency    Past Surgical History:  Procedure Laterality Date  . ABDOMINAL HYSTERECTOMY    . ANTERIOR AND POSTERIOR REPAIR N/A 12/09/2014   Procedure: ANTERIOR (CYSTOCELE) AND POSTERIOR REPAIR (RECTOCELE);   Surgeon: Bjorn Loser, MD;  Location: [facility];  Service: Urology;  Laterality: N/A;  . ANTERIOR CERVICAL DECOMPRESSION/DISCECTOMY FUSION 4 LEVELS Right 10/03/2016   Procedure: ANTERIOR CERVICAL DECOMPRESSION FUSION, CERVICAL 4-5, CERVICAL 5-6, CERVICAL 6-7, CERVICAL 7 TO THORACIC 1 WITH INSTRUMENTATION AND ALLOGRAFT;  Surgeon: Phylliss Bob, MD;  Location: [facility];  Service: Orthopedics;  Laterality: Right;  ANTERIOR CERVICAL DECOMPRESSION FUSION, CERVICAL 4-5, CERVICAL 5-6, CERVICAL 6-7, CERVICAL 7 TO THORACIC 1 WITH INSTRUMENTATION AND ALLOGRAFT; REQUEST 4 HO  . ANTERIOR LAT LUMBAR FUSION Left 11/16/2015   Procedure: LEFT SIDED LATERAL INTERBODY FUSION, LUMBAR 2-3, LUMBAR 3-4, LUMBAR 4-5 WITH INSTRUMENTATION;  Surgeon: Phylliss Bob, MD;  Location: [facility];  Service: Orthopedics;  Laterality: Left;  LEFT SIDED LATEARL INTERBODY FUSION, LUMBAR 2-3, LUMBAR 3-4, LUMBAR 4-5 WITH INSTRUMENTATION   . APPENDECTOMY    . BACK SURGERY    . BONE MARROW ASPIRATION  07/2012  . BONE MARROW BIOPSY  07/2012  . BREAST SURGERY    . CARDIAC CATHETERIZATION    . CARDIAC CATHETERIZATION N/A 01/20/2016   Procedure: Left Heart Cath and Coronary Angiography;  Surgeon: Burnell Blanks, MD;  Location:  [facility];  Service: Cardiovascular;  Laterality: N/A;  . COLONOSCOPY  11/29/2010   Procedure: COLONOSCOPY;  Surgeon: Rogene Houston, MD;  [facility];  Service: Endoscopy;  Laterality: N/A;  . COLONOSCOPY N/A 02/18/2014   Procedure: COLONOSCOPY;  Surgeon: Rogene Houston, MD;  [facility];  Service: Endoscopy;  Laterality: N/A;  1030  . COLONOSCOPY N/A 02/25/2017   Procedure: COLONOSCOPY;  Surgeon: Rogene Houston, MD;  [facility];  Service: Endoscopy;  Laterality: N/A;  10:55  . CYSTOSCOPY N/A 12/09/2014   Procedure: CYSTOSCOPY;  Surgeon: Bjorn Loser, MD;  Location: [facility];  Service: Urology;  Laterality: N/A;  . ESOPHAGEAL DILATION N/A 02/25/2017   Procedure:  ESOPHAGEAL DILATION;  Surgeon: Rogene Houston, MD;  [facility];  Service: Endoscopy;  Laterality: N/A;  . ESOPHAGOGASTRODUODENOSCOPY N/A 02/25/2017   Procedure: ESOPHAGOGASTRODUODENOSCOPY (EGD);  Surgeon: Rogene Houston, MD;  [facility];  Service: Endoscopy;  Laterality: N/A;  . IR GENERIC HISTORICAL  01/11/2016   IR RADIOLOGY PERIPHERAL GUIDED IV START 01/11/2016 Saverio Danker, PA-C [facility]  . IR GENERIC HISTORICAL  01/11/2016   IR US GUIDE VASC ACCESS RIGHT 01/11/2016 Saverio Danker, PA-C [facility]  . LYMPH NODE DISSECTION Right 12/30/2018   Procedure: Lymph Node Dissection;  Surgeon: Lajuana Matte, MD;  Location: [facility];  Service: Thoracic;  Laterality: Right;  . MASTECTOMY     left  . OVARIAN CYST SURGERY     x2  . POLYPECTOMY  02/25/2017   Procedure: POLYPECTOMY;  Surgeon: Rogene Houston, MD;  [facility];  Service: Endoscopy;;  . PORTACATH PLACEMENT Left 02/16/2019   Procedure: INSERTION PORT-A-CATH (CATHETER  LEFT SUBCLAVIAN);  Surgeon: Aviva Signs, MD;  Location: [facility];  Service: General;  Laterality: Left;  . SALPINGOOPHORECTOMY Bilateral 12/09/2014   Procedure: SALPINGO OOPHORECTOMY;  Surgeon: Servando Salina, MD;  Location: [facility];  Service: Gynecology;  Laterality: Bilateral;  . TUBAL LIGATION    . VAGINAL HYSTERECTOMY N/A 12/09/2014   Procedure: HYSTERECTOMY VAGINAL;  Surgeon: Servando Salina, MD;  Location: [facility];  Service: Gynecology;  Laterality: N/A;  . VIDEO ASSISTED THORACOSCOPY (VATS)/ LOBECTOMY Right 12/30/2018   Procedure: VIDEO ASSISTED THORACOSCOPY (VATS)/RIGHT LOWER LOBE WEDGE RESECTION, RIGHT UPPER LOBECTOMY;  Surgeon: Lajuana Matte, MD;  Location: [facility];  Service: Thoracic;  Laterality: Right;  Marland Kitchen VIDEO BRONCHOSCOPY N/A 12/30/2018   Procedure: VIDEO BRONCHOSCOPY;  Surgeon: Lajuana Matte, MD;  Location[facility];  Service: Thoracic;  Laterality: N/A;    SOCIAL HISTORY:  Social History    Socioeconomic History  . Marital status: Divorced    Spouse name: Not on file  . Number of children: 2  . Years of education: Not on file  . Highest education level: Not on file  Occupational History  . Not on file  Tobacco Use  . Smoking status: Former Smoker    Packs/day: 0.50    Years: 50.00    Pack years: 25.00    Types: Cigarettes    Quit date: 11/25/2018    Years since quitting: 0.8  . Smokeless tobacco: Never Used  Substance and Sexual Activity  . Alcohol use: Yes    Comment: occasional  . Drug use: No  . Sexual activity: Yes    Birth control/protection: Post-menopausal  Other Topics Concern  . Not on file  Social History Narrative  . Not on file   Social Determinants of Health   Financial  Resource Strain:   . Difficulty of Paying Living Expenses:   Food Insecurity:   . Worried About Charity fundraiser in the Last Year:   . Arboriculturist in the Last Year:   Transportation Needs:   . Film/video editor (Medical):   Marland Kitchen Lack of Transportation (Non-Medical):   Physical Activity:   . Days of Exercise per Week:   . Minutes of Exercise per Session:   Stress:   . Feeling of Stress :   Social Connections:   . Frequency of Communication with Friends and Family:   . Frequency of Social Gatherings with Friends and Family:   . Attends Religious Services:   . Active Member of Clubs or Organizations:   . Attends Archivist Meetings:   Marland Kitchen Marital Status:   Intimate Partner Violence:   . Fear of Current or Ex-Partner:   . Emotionally Abused:   Marland Kitchen Physically Abused:   . Sexually Abused:     FAMILY HISTORY:  Family History  Problem Relation Age of Onset  . Hypertension Mother   . Heart failure Mother   . Congestive Heart Failure Mother   . COPD Mother   . Pernicious anemia Mother   . Cancer Mother        lung  . Hypertension Father   . CAD Father   . Heart attack Father   . Hypertension Sister   . Cancer Other   . Celiac disease Other      CURRENT MEDICATIONS:  Current Outpatient Medications  Medication Sig Dispense Refill  . albuterol (PROVENTIL HFA;VENTOLIN HFA) 108 (90 Base) MCG/ACT inhaler Inhale 2 puffs into the lungs every 6 (six) hours as needed for wheezing or shortness of breath. 1 Inhaler 2  . aspirin EC 81 MG tablet Take 81 mg by mouth daily.    . Calcium Carb-Cholecalciferol (CALCIUM 600+D) 600-800 MG-UNIT TABS Take 1 tablet by mouth 2 (two) times daily.    Marland Kitchen CARBOPLATIN IV Inject into the vein every 21 ( twenty-one) days.     . Cholecalciferol (VITAMIN D-3) 1000 units CAPS Take 1,000 Units daily by mouth.     . clonazePAM (KLONOPIN) 0.5 MG tablet TAKE ONE TABLET BY MOUTH AT BEDTIME. 90 tablet 0  . cyanocobalamin (,VITAMIN B-12,) 1000 MCG/ML injection INJECT 1 ML INTO THE MUSCLE ONCE MONTHLY AS DIRECTED. (Patient taking differently: Inject 1,000 mcg into the muscle every 30 (thirty) days. ) 1 mL 5  . dexamethasone (DECADRON) 4 MG tablet Take 1 tablet (4 mg total) by mouth 2 (two) times daily with a meal. 30 tablet 0  . ETOPOSIDE IV Inject into the vein every 21 ( twenty-one) days.    Marland Kitchen gabapentin (NEURONTIN) 600 MG tablet TAKE ONE TABLET BY MOUTH ONCE DAILY. 30 tablet 0  . guaiFENesin (MUCINEX) 600 MG 12 hr tablet Take 1 tablet (600 mg total) by mouth 2 (two) times daily as needed for cough or to loosen phlegm.    Marland Kitchen HYDROcodone-acetaminophen (NORCO/VICODIN) 5-325 MG tablet Take 1 tablet by mouth every 8 (eight) hours as needed for moderate pain. 30 tablet 0  . ibandronate (BONIVA) 150 MG tablet Take 150 mg by mouth every 30 (thirty) days.     Marland Kitchen ibuprofen (ADVIL,MOTRIN) 800 MG tablet Take 800 mg every 8 (eight) hours as needed by mouth for moderate pain.    Marland Kitchen ipratropium-albuterol (DUONEB) 0.5-2.5 (3) MG/3ML SOLN Take 3 mLs by nebulization every 6 (six) hours as needed (shortness of breath). 360 mL  0  . lidocaine-prilocaine (EMLA) cream Apply a small amount to port a cath site and cover with plastic wrap 1 hour prior  to chemotherapy appointments 30 g 3  . methocarbamol (ROBAXIN) 500 MG tablet Take 500 mg by mouth every 6 (six) hours as needed for muscle spasms.     . nitroGLYCERIN (NITRODUR - DOSED IN MG/24 HR) 0.1 mg/hr patch Place 0.1 mg onto the skin daily.     . ondansetron (ZOFRAN ODT) 8 MG disintegrating tablet Take 1 tablet (8 mg total) by mouth every 8 (eight) hours as needed for nausea or vomiting. 30 tablet 2  . ondansetron (ZOFRAN) 8 MG tablet Take 1 tablet (8 mg total) by mouth every 8 (eight) hours as needed for nausea or vomiting. 20 tablet 5  . OXYGEN Inhale 3 L into the lungs daily.    . pantoprazole (PROTONIX) 40 MG tablet TAKE ONE TABLET BY MOUTH TWICE DAILY. 60 tablet 0  . polyethylene glycol (MIRALAX / GLYCOLAX) 17 g packet Take 17 g by mouth daily as needed for mild constipation.    . prochlorperazine (COMPAZINE) 10 MG tablet Take 1 tablet (10 mg total) by mouth every 6 (six) hours as needed (Nausea or vomiting). 30 tablet 1  . rizatriptan (MAXALT) 5 MG tablet SMARTSIG:1-2 Tablet(s) By Mouth 1 to 2 Times Daily PRN    . rosuvastatin (CRESTOR) 40 MG tablet Take 40 mg at bedtime by mouth.     . traMADol (ULTRAM) 50 MG tablet Take 50 mg by mouth every 6 (six) hours as needed.     No current facility-administered medications for this visit.    ALLERGIES:  Allergies  Allergen Reactions  . Penicillins Hives and Rash    Did it involve swelling of the face/tongue/throat, SOB, or low BP? No Did it involve sudden or severe rash/hives, skin peeling, or any reaction on the inside of your mouth or nose? No Did you need to seek medical attention at a hospital or doctor's office? No When did it last happen?5-10 year If all above answers are "NO", may proceed with cephalosporin use.    . Tape Rash    Medipore, Coban, and paper tape CAN be tolerated    PHYSICAL EXAM:  Performance status (ECOG): 1 - Symptomatic but completely ambulatory  There were no vitals filed for this visit. Wt  Readings from Last 3 Encounters:  08/31/19 103 lb 9.6 oz (47 kg)  08/21/19 107 lb (48.5 kg)  06/29/19 109 lb 9.6 oz (49.7 kg)   Physical Exam Vitals reviewed.  Constitutional:      Appearance: Normal appearance.  Cardiovascular:     Rate and Rhythm: Normal rate and regular rhythm.     Pulses: Normal pulses.     Heart sounds: Normal heart sounds.  Pulmonary:     Effort: Pulmonary effort is normal.     Breath sounds: Examination of the right-lower field reveals decreased breath sounds. Decreased breath sounds present.  Musculoskeletal:     Left hip: Tenderness present.       Legs:  Neurological:     General: No focal deficit present.     Mental Status: She is alert and oriented to person, place, and time.  Psychiatric:        Mood and Affect: Mood normal.        Behavior: Behavior normal.      LABORATORY DATA:  I have reviewed the labs as listed.  CBC Latest Ref Rng & Units 08/03/2019 06/29/2019 06/01/2019  WBC 4.0 - 10.5 K/uL 5.6 8.4 12.2(H)  Hemoglobin 12.0 - 15.0 g/dL 11.7(L) 10.3(L) 9.1(L)  Hematocrit 36.0 - 46.0 % 37.9 33.3(L) 30.1(L)  Platelets 150 - 400 K/uL 356 338 472(H)   CMP Latest Ref Rng & Units 08/03/2019 06/01/2019 05/11/2019  Glucose 70 - 99 mg/dL 183(H) 105(H) 112(H)  BUN 8 - 23 mg/dL _0 Creatinine 0.44 - 1.00 mg/dL 1.13(H) 0.98 0.97  Sodium 135 - 145 mmol/L 137 136 135  Potassium 3.5 - 5.1 mmol/L 2.9(L) 3.6 3.8  Chloride 98 - 111 mmol/L 103 104 102  CO2 22 - 32 mmol/L _1 Calcium 8.9 - 10.3 mg/dL 8.9 8.8(L) 8.8(L)  Total Protein 6.5 - 8.1 g/dL 6.7 7.1 6.4(L)  Total Bilirubin 0.3 - 1.2 mg/dL 0.4 0.5 0.6  Alkaline Phos 38 - 126 U/L 89 98 95  AST 15 - 41 U/L _2 ALT 0 - 44 U/L _3 DIAGNOSTIC IMAGING:  I have independently reviewed the scans and discussed with the patient. DG Chest 2 View  Result Date: 08/21/2019 CLINICAL DATA:  History of small cell lung cancer. EXAM: CHEST - 2 VIEW COMPARISON:  February 16, 2019. FINDINGS:  Stable cardiomediastinal silhouette. Left subclavian Port-A-Cath is noted with distal tip in expected position of the SVC. Left lung is clear. No pneumothorax is noted. Stable right midlung and basilar opacities are noted most consistent with scarring. Bony thorax is unremarkable. IMPRESSION: Stable right lung scarring. No acute cardiopulmonary abnormality seen. Electronically Signed   By: Marijo Conception M.D.   On: 08/21/2019 13:44   CT Chest W Contrast  Result Date: 09/15/2019 CLINICAL DATA:  Right upper lobe small cell lung cancer status post right upper lobectomy 12/30/2018. Additional history of left breast cancer and melanoma. EXAM: CT CHEST WITH CONTRAST TECHNIQUE: Multidetector CT imaging of the chest was performed during intravenous contrast administration. CONTRAST:  94m ISOVUE-300 IOPAMIDOL (ISOVUE-300) INJECTION 61% COMPARISON:  05/27/2019 chest CT. FINDINGS: Cardiovascular: Normal heart size. Stable small anterior pericardial effusion/thickening. Left circumflex coronary atherosclerosis. Left subclavian Port-A-Cath terminates in the middle third of the SVC. Atherosclerotic thoracic aorta with stable ectatic 4.4 cm ascending thoracic aorta. Stable top-normal caliber main pulmonary artery (3.2 cm diameter). Mediastinum/Nodes: No discrete thyroid nodules. Unremarkable esophagus. Surgical clips again noted in the left axilla. No axillary adenopathy. Mildly enlarged 1.0 cm subcarinal node (series 2/image 67), previously 1.1 cm, not appreciably changed. No new pathologically enlarged mediastinal nodes. No hilar adenopathy. Lungs/Pleura: No pneumothorax. Status post right upper lobectomy. Tiny loculated anterior right pleural effusion/pleural thickening is decreased. No left pleural effusions. Moderate to severe centrilobular and paraseptal emphysema with mild diffuse bronchial wall thickening. Numerous (greater than 20) new small solid irregular pulmonary nodules scattered throughout both lungs with  representative 7 mm peripheral right lower lobe nodule (series 3/image 56) and 5 mm left upper lobe nodule (series 3/image 65). Irregular 1.1 cm nodular opacity in the anterior left lower lobe (series 3/image 70) is stable. Thick parenchymal bands throughout dependent right lung base, decreased in thickness, compatible with evolving postinfectious scarring. Upper abdomen: Granulomatous subcentimeter left liver lobe calcification. Stable 2.6 cm right adrenal nodule and 1.9 cm left adrenal nodule, characterized as benign adenomas on prior PET-CT studies. Simple renal cysts in the upper kidneys bilaterally, largest 2.8 cm in the posterior upper left kidney. Musculoskeletal: No aggressive appearing focal osseous lesions. Left mastectomy with left breast prosthesis. Partially visualized surgical hardware from ACDF. Bilateral posterior spinal  fusion hardware in the lumbar spine. Marked lower thoracic spondylosis. IMPRESSION: 1. Numerous (greater than 20) new small solid irregular pulmonary nodules scattered throughout both lungs, largest 7 mm in the right lower lobe, suspicious for new pulmonary metastases. These nodules are below PET resolution. Recommend attention on follow-up chest CT in 3 months. 2. Tiny loculated anterior right pleural effusion is decreased. Decreasing evolving postinfectious scarring at the dependent right lung base. 3. Stable mild subcarinal adenopathy, nonspecific. 4. Stable bilateral adrenal adenomas. 5. Aortic Atherosclerosis (ICD10-I70.0) and Emphysema (ICD10-J43.9). Electronically Signed   By: Ilona Sorrel M.D.   On: 09/15/2019 14:18   MR Brain W Wo Contrast  Result Date: 09/16/2019 CLINICAL DATA:  Lung cancer staging EXAM: MRI HEAD WITHOUT AND WITH CONTRAST TECHNIQUE: Multiplanar, multiecho pulse sequences of the brain and surrounding structures were obtained without and with intravenous contrast. CONTRAST:  57m MULTIHANCE GADOBENATE DIMEGLUMINE 529 MG/ML IV SOLN COMPARISON:  05/28/2019  FINDINGS: Brain: No mass or swelling to suggest metastatic disease. Incidental 1 cm simple pineal cyst. No infarct, hydrocephalus, or collection. Vascular: Normal flow voids and vascular enhancement. Skull and upper cervical spine: Hypointense appearance at the C3 vertebral body and left articular process. There is a subjacent cervical spine fusion which is minimally covered. Sinuses/Orbits: Right mastoid and middle ear opacification with negative nasopharynx. IMPRESSION: 1. No evidence of brain metastasis. 2. Concern for interval C3 metastasis. 3. Right mastoid and middle ear opacification with negative nasopharynx. Electronically Signed   By: JMonte FantasiaM.D.   On: 09/16/2019 07:12     ASSESSMENT:  1.  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 -30 Gy of radiation in 10 fractions from 01/28/2019 through 02/10/2019. -4 cycles of carboplatin and etoposide from 03/09/2019 through 05/11/2019. -PCI completed on 07/31/2019. -We reviewed MRI of the brain on 09/15/2019 with no evidence of brain meta stasis.  Concern for hypointense appearance at the C3 vertebral body and left articular process. -CT chest on 09/15/2019 shows numerous new small solid irregular pulmonary nodules scattered throughout both lungs, largest 7 mm on the right lower lobe.  Tiny loculated anterior right pleural effusion decreased.  Stable mild subcarinal adenopathy.  Bilateral stable adrenal adenomas.   PLAN:  1.  Stage III (T3N1) small cell lung cancer: -I have reviewed images of the CT scan with the patient and her 2 daughters.  I think she is developing metastatic disease. -I have recommended a PET CT scan. -I have also reviewed MRI of the brain which showed concerning area on C3  vertebral body. -I have recommended MRI of the cervical spine.  We will see her back after the scans.  2.  Essential thrombocytosis: -Hydroxyurea on hold since chemotherapy for lung cancer. -Platelet count increased to 473 today.  We will closely monitor.   3.  Macrocytic anemia: -Hemoglobin today is 12.3 with MCV of 86.6.    Orders placed this encounter:  No orders of the defined types were placed in this encounter.  Total time spent is 30 minutes with more than 50% of the time spent face-to-face discussing and reviewing scans, further plan, counseling and coordination of care.  SDerek Jack MD [phone]  I, AJacqualyn Posey am acting as a scribe for Dr. SSanda Linger  IKinnie ScalesMD, have  reviewed the above documentation for accuracy and completeness, and I agree with the above.

## 2019-09-17 NOTE — Patient Instructions (Signed)
[place] at [facility] Discharge Instructions  You were seen today by Dr. Delton Coombes. He went over your recent results. You will be scheduled for a PET scan and an MRI of the neck. Dr. Delton Coombes will see you back after your scans to discuss the results and the follow up.   Thank you for choosing [place] at [facility] to provide your oncology and hematology care.  To afford each patient quality time with our provider, please arrive at least 15 minutes before your scheduled appointment time.   If you have a lab appointment with the [place] please come in thru the  Main Entrance and check in at the main information desk  You need to re-schedule your appointment should you arrive 10 or more minutes late.  We strive to give you quality time with our providers, and arriving late affects you and other patients whose appointments are after yours.  Also, if you no show three or more times for appointments you may be dismissed from the clinic at the providers discretion.     Again, thank you for choosing [facility].  Our hope is that these requests will decrease the amount of time that you wait before being seen by our physicians.       _____________________________________________________________  Should you have questions after your visit to [facility], please contact our office at [phone] between the hours of 8:00 a.m. and 4:30 p.m.  Voicemails left after 4:00 p.m. will not be returned until the following business day.  For prescription refill requests, have your pharmacy contact our office and allow 72 hours.    Cancer Center Support Programs:   > Cancer Support Group  2nd Tuesday of the month 1pm-2pm, Journey Room

## 2019-09-19 ENCOUNTER — Other Ambulatory Visit (HOSPITAL_COMMUNITY): Payer: Self-pay | Admitting: Nurse Practitioner

## 2019-09-21 ENCOUNTER — Other Ambulatory Visit (HOSPITAL_COMMUNITY): Payer: Self-pay | Admitting: *Deleted

## 2019-09-21 MED ORDER — GABAPENTIN 600 MG PO TABS: 600.0000 mg | ORAL_TABLET | Freq: Every day | ORAL | 6 refills | Status: DC

## 2019-09-23 ENCOUNTER — Other Ambulatory Visit (HOSPITAL_COMMUNITY): Payer: Self-pay | Admitting: *Deleted

## 2019-09-23 DIAGNOSIS — E876 Hypokalemia: Secondary | ICD-10-CM

## 2019-09-23 MED ORDER — POTASSIUM CHLORIDE CRYS ER 20 MEQ PO TBCR: 20.0000 meq | EXTENDED_RELEASE_TABLET | Freq: Three times a day (TID) | ORAL | 2 refills | Status: DC

## 2019-09-28 ENCOUNTER — Ambulatory Visit (HOSPITAL_COMMUNITY)
Admission: RE | Admit: 2019-09-28 | Discharge: 2019-09-28 | Disposition: A | Discharge status: Home / Self Care | Payer: Medicare Other | Source: Ambulatory Visit | Attending: Hematology | Admitting: Hematology

## 2019-09-28 ENCOUNTER — Other Ambulatory Visit: Payer: Self-pay

## 2019-09-28 ENCOUNTER — Other Ambulatory Visit (HOSPITAL_COMMUNITY): Payer: Self-pay | Admitting: *Deleted

## 2019-09-28 ENCOUNTER — Other Ambulatory Visit (HOSPITAL_COMMUNITY): Payer: Self-pay | Admitting: Hematology

## 2019-09-28 DIAGNOSIS — I700 Atherosclerosis of aorta: Secondary | ICD-10-CM | POA: Insufficient documentation

## 2019-09-28 DIAGNOSIS — C3491 Malignant neoplasm of unspecified part of right bronchus or lung: Secondary | ICD-10-CM | POA: Diagnosis not present

## 2019-09-28 DIAGNOSIS — I2510 Atherosclerotic heart disease of native coronary artery without angina pectoris: Secondary | ICD-10-CM | POA: Diagnosis not present

## 2019-09-28 DIAGNOSIS — D3501 Benign neoplasm of right adrenal gland: Secondary | ICD-10-CM | POA: Diagnosis not present

## 2019-09-28 DIAGNOSIS — R918 Other nonspecific abnormal finding of lung field: Secondary | ICD-10-CM | POA: Diagnosis not present

## 2019-09-28 DIAGNOSIS — J439 Emphysema, unspecified: Secondary | ICD-10-CM | POA: Diagnosis not present

## 2019-09-28 LAB — GLUCOSE, CAPILLARY: Glucose-Capillary: 73 mg/dL (ref 70–99)

## 2019-09-28 MED ORDER — OXYCODONE-ACETAMINOPHEN 5-325 MG PO TABS: 1.0000 | ORAL_TABLET | Freq: Three times a day (TID) | ORAL | 0 refills | Status: DC | PRN

## 2019-09-28 MED ORDER — FLUDEOXYGLUCOSE F - 18 (FDG) INJECTION
4.9000 | Freq: Once | INTRAVENOUS | Status: AC | PRN
  Administered 2019-09-28: 4.9 via INTRAVENOUS

## 2019-09-28 MED ORDER — DEXAMETHASONE 2 MG PO TABS
2.0000 mg | ORAL_TABLET | Freq: Every day | ORAL | 3 refills | Status: DC
Start: 2019-09-28 — End: 2020-04-12

## 2019-10-01 ENCOUNTER — Ambulatory Visit (HOSPITAL_COMMUNITY): Payer: Medicare Other | Admitting: Hematology

## 2019-10-13 ENCOUNTER — Ambulatory Visit
Admission: RE | Admit: 2019-10-13 | Discharge: 2019-10-13 | Disposition: A | Discharge status: Home / Self Care | Payer: Medicare Other | Source: Ambulatory Visit | Attending: Hematology | Admitting: Hematology

## 2019-10-13 ENCOUNTER — Other Ambulatory Visit: Payer: Self-pay

## 2019-10-13 DIAGNOSIS — C50912 Malignant neoplasm of unspecified site of left female breast: Secondary | ICD-10-CM

## 2019-10-13 DIAGNOSIS — C3491 Malignant neoplasm of unspecified part of right bronchus or lung: Secondary | ICD-10-CM

## 2019-10-13 MED ORDER — GADOBENATE DIMEGLUMINE 529 MG/ML IV SOLN
8.0000 mL | Freq: Once | INTRAVENOUS | Status: AC | PRN
  Administered 2019-10-13: 8 mL via INTRAVENOUS

## 2019-10-13 MED ORDER — HEPARIN SOD (PORK) LOCK FLUSH 100 UNIT/ML IV SOLN
500.0000 [IU] | Freq: Once | INTRAVENOUS | Status: AC
  Administered 2019-10-13: 500 [IU] via INTRAVENOUS

## 2019-10-20 ENCOUNTER — Inpatient Hospital Stay (HOSPITAL_COMMUNITY): Payer: Medicare Other | Attending: Hematology | Admitting: Hematology

## 2019-10-20 ENCOUNTER — Other Ambulatory Visit: Payer: Self-pay

## 2019-10-20 ENCOUNTER — Inpatient Hospital Stay (HOSPITAL_COMMUNITY): Payer: Medicare Other

## 2019-10-20 DIAGNOSIS — Z79899 Other long term (current) drug therapy: Secondary | ICD-10-CM | POA: Insufficient documentation

## 2019-10-20 DIAGNOSIS — C3491 Malignant neoplasm of unspecified part of right bronchus or lung: Secondary | ICD-10-CM

## 2019-10-20 DIAGNOSIS — D473 Essential (hemorrhagic) thrombocythemia: Secondary | ICD-10-CM | POA: Insufficient documentation

## 2019-10-20 DIAGNOSIS — Z85820 Personal history of malignant melanoma of skin: Secondary | ICD-10-CM | POA: Diagnosis not present

## 2019-10-20 DIAGNOSIS — Z920 Personal history of contraception: Secondary | ICD-10-CM | POA: Insufficient documentation

## 2019-10-20 DIAGNOSIS — C3411 Malignant neoplasm of upper lobe, right bronchus or lung: Secondary | ICD-10-CM | POA: Diagnosis present

## 2019-10-20 DIAGNOSIS — Z170 Estrogen receptor positive status [ER+]: Secondary | ICD-10-CM | POA: Diagnosis not present

## 2019-10-20 DIAGNOSIS — Z9221 Personal history of antineoplastic chemotherapy: Secondary | ICD-10-CM | POA: Diagnosis not present

## 2019-10-20 DIAGNOSIS — Z9012 Acquired absence of left breast and nipple: Secondary | ICD-10-CM | POA: Insufficient documentation

## 2019-10-20 DIAGNOSIS — Z853 Personal history of malignant neoplasm of breast: Secondary | ICD-10-CM | POA: Insufficient documentation

## 2019-10-20 DIAGNOSIS — D539 Nutritional anemia, unspecified: Secondary | ICD-10-CM | POA: Diagnosis not present

## 2019-10-20 DIAGNOSIS — Z923 Personal history of irradiation: Secondary | ICD-10-CM | POA: Insufficient documentation

## 2019-10-20 LAB — CBC WITH DIFFERENTIAL/PLATELET
Abs Immature Granulocytes: 0.02 10*3/uL (ref 0.00–0.07)
Basophils Absolute: 0 10*3/uL (ref 0.0–0.1)
Basophils Relative: 0 %
Eosinophils Absolute: 0.1 10*3/uL (ref 0.0–0.5)
Eosinophils Relative: 2 %
HCT: 33.9 % — ABNORMAL LOW (ref 36.0–46.0)
Hemoglobin: 10.8 g/dL — ABNORMAL LOW (ref 12.0–15.0)
Immature Granulocytes: 0 %
Lymphocytes Relative: 18 %
Lymphs Abs: 1.2 10*3/uL (ref 0.7–4.0)
MCH: 27.8 pg (ref 26.0–34.0)
MCHC: 31.9 g/dL (ref 30.0–36.0)
MCV: 87.1 fL (ref 80.0–100.0)
Monocytes Absolute: 0.5 10*3/uL (ref 0.1–1.0)
Monocytes Relative: 8 %
Neutro Abs: 4.9 10*3/uL (ref 1.7–7.7)
Neutrophils Relative %: 72 %
Platelets: 387 10*3/uL (ref 150–400)
RBC: 3.89 MIL/uL (ref 3.87–5.11)
RDW: 16.7 % — ABNORMAL HIGH (ref 11.5–15.5)
WBC: 6.8 10*3/uL (ref 4.0–10.5)
nRBC: 0 % (ref 0.0–0.2)

## 2019-10-20 LAB — COMPREHENSIVE METABOLIC PANEL
ALT: 14 U/L (ref 0–44)
AST: 22 U/L (ref 15–41)
Albumin: 3.4 g/dL — ABNORMAL LOW (ref 3.5–5.0)
Alkaline Phosphatase: 66 U/L (ref 38–126)
Anion gap: 9 (ref 5–15)
BUN: 14 mg/dL (ref 8–23)
CO2: 23 mmol/L (ref 22–32)
Calcium: 8.5 mg/dL — ABNORMAL LOW (ref 8.9–10.3)
Chloride: 100 mmol/L (ref 98–111)
Creatinine, Ser: 0.95 mg/dL (ref 0.44–1.00)
GFR calc Af Amer: >60 mL/min (ref >60)
GFR calc non Af Amer: >60 mL/min (ref >60)
Glucose, Bld: 172 mg/dL — ABNORMAL HIGH (ref 70–99)
Potassium: 3.3 mmol/L — ABNORMAL LOW (ref 3.5–5.1)
Sodium: 132 mmol/L — ABNORMAL LOW (ref 135–145)
Total Bilirubin: 0.4 mg/dL (ref 0.3–1.2)
Total Protein: 6.4 g/dL — ABNORMAL LOW (ref 6.5–8.1)

## 2019-10-20 MED ORDER — HEPARIN SOD (PORK) LOCK FLUSH 100 UNIT/ML IV SOLN
500.0000 [IU] | Freq: Once | INTRAVENOUS | Status: AC
  Administered 2019-10-20: 500 [IU] via INTRAVENOUS

## 2019-10-20 MED ORDER — SODIUM CHLORIDE 0.9% FLUSH
10.0000 mL | INTRAVENOUS | Status: DC | PRN
  Administered 2019-10-20: 10 mL via INTRAVENOUS

## 2019-10-20 MED ORDER — DRONABINOL 2.5 MG PO CAPS
2.5000 mg | ORAL_CAPSULE | Freq: Two times a day (BID) | ORAL | 3 refills | Status: DC
Start: 2019-10-20 — End: 2020-01-26

## 2019-10-20 NOTE — Patient Instructions (Signed)
[place] at [facility] Discharge Instructions  You were seen today by Dr. Delton Coombes. He went over your recent scan results. He will see you back in 1 month for labs and follow up.   Thank you for choosing [place] at [facility] to provide your oncology and hematology care.  To afford each patient quality time with our provider, please arrive at least 15 minutes before your scheduled appointment time.   If you have a lab appointment with the [place] please come in thru the  Main Entrance and check in at the main information desk  You need to re-schedule your appointment should you arrive 10 or more minutes late.  We strive to give you quality time with our providers, and arriving late affects you and other patients whose appointments are after yours.  Also, if you no show three or more times for appointments you may be dismissed from the clinic at the providers discretion.     Again, thank you for choosing [facility].  Our hope is that these requests will decrease the amount of time that you wait before being seen by our physicians.       _____________________________________________________________  Should you have questions after your visit to [facility], please contact our office at [phone] between the hours of 8:00 a.m. and 4:30 p.m.  Voicemails left after 4:00 p.m. will not be returned until the following business day.  For prescription refill requests, have your pharmacy contact our office and allow 72 hours.    Cancer Center Support Programs:   > Cancer Support Group  2nd Tuesday of the month 1pm-2pm, Journey Room

## 2019-10-20 NOTE — Progress Notes (Addendum)
[address] [place], [place] [address]   CLINIC:  Medical Oncology/Hematology  PCP:  [name], NP [address] / [place] [address] [phone]   REASON FOR VISIT:  Follow-up for small cell lung cancer  PRIOR THERAPY:  1. Right upper lobectomy on 12/30/2018. 2. 30 Gy of radiation in 10 fractions from 01/28/2019 through 02/10/2019. 3. Carboplatin and etoposide x 4 cycles from 03/09/2019 through 05/11/2019.  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 The 3 liver lesions are not hypermetabolic. The radiologist suspects they may be subtly present on prior CT chest from 10/2013, to further reassuring that these are likely benign lesions.   Melanoma ([facility])  07/09/2013 Initial Biopsy   Initial biopsy L upper thigh/buttocks melanoma   07/20/2013 Surgery   Excision L lateral buttock/upper thigh melanoma, clear margins   07/20/2013 Pathology Results   Breslow depth 1.02 mm, pT2a    Small cell lung carcinoma, right ([facility])  11/27/2018 Initial Diagnosis   Small cell lung carcinoma, right ([facility])   03/09/2019 -  Chemotherapy   The patient had palonosetron (ALOXI) injection 0.25 mg, 0.25 mg, Intravenous,  Once, 4 of 4 cycles Administration: 0.25 mg (03/09/2019), 0.25 mg (03/30/2019), 0.25 mg (04/20/2019), 0.25 mg (05/11/2019) pegfilgrastim (NEULASTA ONPRO KIT) injection 6 mg, 6 mg, Subcutaneous, Once, 1 of 1 cycle Administration: 6 mg  (03/11/2019) pegfilgrastim-jmdb (FULPHILA) injection 6 mg, 6 mg, Subcutaneous,  Once, 3 of 3 cycles Administration: 6 mg (04/03/2019), 6 mg (04/24/2019), 6 mg (05/15/2019) CARBOplatin (PARAPLATIN) 330 mg in sodium chloride 0.9 % 250 mL chemo infusion, 330 mg (100 % of original dose 325.5 mg), Intravenous,  Once, 4 of 4 cycles Dose modification:   (original dose 325.5 mg, Cycle 1),   (original dose 325.5 mg, Cycle 2),   (original dose 309 mg, Cycle 3),   (original dose 325.5 mg, Cycle 4) Administration: 330 mg (03/09/2019), 330 mg (03/30/2019), 310 mg (04/20/2019), 330 mg (05/11/2019) etoposide (VEPESID) 150 mg in sodium chloride 0.9 % 500 mL chemo infusion, 100 mg/m2 = 150 mg, Intravenous,  Once, 4 of 4 cycles Administration: 150 mg (03/09/2019), 150 mg (03/10/2019), 150 mg (03/11/2019), 150 mg (03/30/2019), 150 mg (03/31/2019), 150 mg (04/01/2019), 150 mg (04/20/2019), 150 mg (04/21/2019), 150 mg (04/22/2019), 150 mg (05/11/2019), 150 mg (05/12/2019), 150 mg (05/13/2019) fosaprepitant (EMEND) 150 mg, dexamethasone (DECADRON) 12 mg in sodium chloride 0.9 % 145 mL IVPB, , Intravenous,  Once, 4 of 4 cycles Administration:  (03/09/2019),  (03/30/2019),  (04/20/2019),  (05/11/2019)  for chemotherapy treatment.    03/09/2019 Cancer Staging   Staging form: Lung, AJCC 8th Edition - Clinical: Stage IIIA (cT3, cN1, cM0) - Signed by Derek Jack, MD on 03/09/2019     CANCER STAGING: Cancer Staging Small cell lung carcinoma, right ([facility]) Staging form: Lung, AJCC 8th Edition - Clinical: Stage IIIA (cT3, cN1, cM0) - Signed by Derek Jack, MD on 03/09/2019   INTERVAL HISTORY:  Ms. DEVRI KREHER, a 69 y.o. female, returns for routine follow-up of her small cell lung cancer. Thais was last seen on 09/17/2019.  Today she is accompanied by her daughter.  Her appetite has been very poor and has not tried anything for it.  She reports back pain rated as 4 out of 10.  Appetite and energy levels are reported at  25%.  Shortness of breath on exertion is stable.  Chronic constipation is also stable.  She is requesting home oxygen equipment.   REVIEW OF SYSTEMS:  Review of Systems  Constitutional: Positive for appetite change (severely decreased) and fatigue (severe).  HENT:   Positive for trouble swallowing.   Respiratory: Positive for shortness of breath (w/ exertion).   Gastrointestinal: Positive for constipation.  Musculoskeletal: Positive for back pain (5/10 back pain).  Neurological: Positive for headaches.  All other systems reviewed and are negative.   PAST MEDICAL/SURGICAL HISTORY:  Past Medical History:  Diagnosis Date  . 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 Anginal pain ([facility])   . Arthritis   . Ascending aortic aneurysm ([facility])   . Cancer [facility]) 1995   breast, left, mastectomy/chemo  . Chest pain    Possibly cardiac. No evidence of ischemia/injury based upon normal troponin I. Chest discomfort could be tachycardia induced supply demand mismatch.   . CHF (congestive heart failure) ([facility]) 11/17/2015   after surgery   . Colon adenomas   . Coronary artery disease   . DJD (degenerative joint disease)   . Dyspnea    with exertion  . Emphysema of lung ([facility]) 11/17/2015  . Essential hypertension, benign   . GERD (gastroesophageal reflux disease)   . History of hiatal hernia   . Hyperlipidemia   . Hypertension   . Melanoma ([facility]) 01/09/2016  . Osteopenia   . Palpitations   . Pernicious anemia 03/06/2016  . Pernicious anemia   . Pure hypercholesterolemia   . Raynaud's disease   . Thrombocythemia, essential ([facility]) 01/09/2016  . Thrombocytosis ([facility])    Idiopathic  . Vitamin D deficiency    Past Surgical History:  Procedure Laterality Date  . ABDOMINAL HYSTERECTOMY    . ANTERIOR AND POSTERIOR REPAIR N/A 12/09/2014   Procedure: ANTERIOR (CYSTOCELE) AND POSTERIOR REPAIR (RECTOCELE);  Surgeon: Bjorn Loser, MD;  Location: [facility];  Service: Urology;  Laterality: N/A;  .  ANTERIOR CERVICAL DECOMPRESSION/DISCECTOMY FUSION 4 LEVELS Right 10/03/2016   Procedure: ANTERIOR CERVICAL DECOMPRESSION FUSION, CERVICAL 4-5, CERVICAL 5-6, CERVICAL 6-7, CERVICAL 7 TO THORACIC 1 WITH INSTRUMENTATION AND ALLOGRAFT;  Surgeon: Phylliss Bob, MD;  Location: [facility];  Service: Orthopedics;  Laterality: Right;  ANTERIOR CERVICAL DECOMPRESSION FUSION, CERVICAL 4-5, CERVICAL 5-6, CERVICAL 6-7, CERVICAL 7 TO THORACIC 1 WITH INSTRUMENTATION AND ALLOGRAFT; REQUEST 4 HO  . ANTERIOR LAT LUMBAR FUSION Left 11/16/2015   Procedure: LEFT SIDED LATERAL INTERBODY FUSION, LUMBAR 2-3, LUMBAR 3-4, LUMBAR 4-5 WITH INSTRUMENTATION;  Surgeon: Phylliss Bob, MD;  Location: [facility];  Service: Orthopedics;  Laterality: Left;  LEFT SIDED LATEARL INTERBODY FUSION, LUMBAR 2-3, LUMBAR 3-4, LUMBAR 4-5 WITH INSTRUMENTATION   . APPENDECTOMY    . BACK SURGERY    . BONE MARROW ASPIRATION  07/2012  . BONE MARROW BIOPSY  07/2012  . BREAST SURGERY    . CARDIAC CATHETERIZATION    . CARDIAC CATHETERIZATION N/A 01/20/2016   Procedure: Left Heart Cath and Coronary Angiography;  Surgeon: Burnell Blanks, MD;  Location: [facility];  Service: Cardiovascular;  Laterality: N/A;  .  COLONOSCOPY  11/29/2010   Procedure: COLONOSCOPY;  Surgeon: Rogene Houston, MD;  [facility];  Service: Endoscopy;  Laterality: N/A;  . COLONOSCOPY N/A 02/18/2014   Procedure: COLONOSCOPY;  Surgeon: Rogene Houston, MD;  [facility];  Service: Endoscopy;  Laterality: N/A;  1030  . COLONOSCOPY N/A 02/25/2017   Procedure: COLONOSCOPY;  Surgeon: Rogene Houston, MD;  [facility];  Service: Endoscopy;  Laterality: N/A;  10:55  . CYSTOSCOPY N/A 12/09/2014   Procedure: CYSTOSCOPY;  Surgeon: Bjorn Loser, MD;  Location: [facility];  Service: Urology;  Laterality: N/A;  . ESOPHAGEAL DILATION N/A 02/25/2017   Procedure: ESOPHAGEAL DILATION;  Surgeon: Rogene Houston, MD;  [facility];  Service: Endoscopy;   Laterality: N/A;  . ESOPHAGOGASTRODUODENOSCOPY N/A 02/25/2017   Procedure: ESOPHAGOGASTRODUODENOSCOPY (EGD);  Surgeon: Rogene Houston, MD;  [facility];  Service: Endoscopy;  Laterality: N/A;  . IR GENERIC HISTORICAL  01/11/2016   IR RADIOLOGY PERIPHERAL GUIDED IV START 01/11/2016 Saverio Danker, PA-C [facility]  . IR GENERIC HISTORICAL  01/11/2016   IR US GUIDE VASC ACCESS RIGHT 01/11/2016 Saverio Danker, PA-C [facility]  . LYMPH NODE DISSECTION Right 12/30/2018   Procedure: Lymph Node Dissection;  Surgeon: Lajuana Matte, MD;  Location: [facility];  Service: Thoracic;  Laterality: Right;  . MASTECTOMY     left  . OVARIAN CYST SURGERY     x2  . POLYPECTOMY  02/25/2017   Procedure: POLYPECTOMY;  Surgeon: Rogene Houston, MD;  [facility];  Service: Endoscopy;;  . PORTACATH PLACEMENT Left 02/16/2019   Procedure: INSERTION PORT-A-CATH (CATHETER  LEFT SUBCLAVIAN);  Surgeon: Aviva Signs, MD;  Location: [facility];  Service: General;  Laterality: Left;  . SALPINGOOPHORECTOMY Bilateral 12/09/2014   Procedure: SALPINGO OOPHORECTOMY;  Surgeon: Servando Salina, MD;  Location: [facility];  Service: Gynecology;  Laterality: Bilateral;  . TUBAL LIGATION    . VAGINAL HYSTERECTOMY N/A 12/09/2014   Procedure: HYSTERECTOMY VAGINAL;  Surgeon: Servando Salina, MD;  Location: [facility];  Service: Gynecology;  Laterality: N/A;  . VIDEO ASSISTED THORACOSCOPY (VATS)/ LOBECTOMY Right 12/30/2018   Procedure: VIDEO ASSISTED THORACOSCOPY (VATS)/RIGHT LOWER LOBE WEDGE RESECTION, RIGHT UPPER LOBECTOMY;  Surgeon: Lajuana Matte, MD;  Location: [facility];  Service: Thoracic;  Laterality: Right;  Marland Kitchen VIDEO BRONCHOSCOPY N/A 12/30/2018   Procedure: VIDEO BRONCHOSCOPY;  Surgeon: Lajuana Matte, MD;  Location[facility];  Service: Thoracic;  Laterality: N/A;    SOCIAL HISTORY:  Social History   Socioeconomic History  . Marital status: Divorced    Spouse name: Not on file  . Number of children: 2   . Years of education: Not on file  . Highest education level: Not on file  Occupational History  . Not on file  Tobacco Use  . Smoking status: Former Smoker    Packs/day: 0.50    Years: 50.00    Pack years: 25.00    Types: Cigarettes    Quit date: 11/25/2018    Years since quitting: 0.9  . Smokeless tobacco: Never Used  Vaping Use  . Vaping Use: Never used  Substance and Sexual Activity  . Alcohol use: Yes    Comment: occasional  . Drug use: No  . Sexual activity: Yes    Birth control/protection: Post-menopausal  Other Topics Concern  . Not on file  Social History Narrative  . Not on file   Social Determinants of Health   Financial Resource Strain:   .  Difficulty of Paying Living Expenses:   Food Insecurity:   . Worried About Charity fundraiser in the Last Year:   . Arboriculturist in the Last Year:   Transportation Needs:   . Film/video editor (Medical):   Marland Kitchen Lack of Transportation (Non-Medical):   Physical Activity:   . Days of Exercise per Week:   . Minutes of Exercise per Session:   Stress:   . Feeling of Stress :   Social Connections:   . Frequency of Communication with Friends and Family:   . Frequency of Social Gatherings with Friends and Family:   . Attends Religious Services:   . Active Member of Clubs or Organizations:   . Attends Archivist Meetings:   Marland Kitchen Marital Status:   Intimate Partner Violence:   . Fear of Current or Ex-Partner:   . Emotionally Abused:   Marland Kitchen Physically Abused:   . Sexually Abused:     FAMILY HISTORY:  Family History  Problem Relation Age of Onset  . Hypertension Mother   . Heart failure Mother   . Congestive Heart Failure Mother   . COPD Mother   . Pernicious anemia Mother   . Cancer Mother        lung  . Hypertension Father   . CAD Father   . Heart attack Father   . Hypertension Sister   . Cancer Other   . Celiac disease Other     CURRENT MEDICATIONS:  Current Outpatient Medications  Medication  Sig Dispense Refill  . albuterol (PROVENTIL HFA;VENTOLIN HFA) 108 (90 Base) MCG/ACT inhaler Inhale 2 puffs into the lungs every 6 (six) hours as needed for wheezing or shortness of breath. (Patient not taking: Reported on 09/17/2019) 1 Inhaler 2  . aspirin EC 81 MG tablet Take 81 mg by mouth daily.    . Calcium Carb-Cholecalciferol (CALCIUM 600+D) 600-800 MG-UNIT TABS Take 1 tablet by mouth 2 (two) times daily.    . Cholecalciferol (VITAMIN D-3) 1000 units CAPS Take 1,000 Units daily by mouth.     . clonazePAM (KLONOPIN) 0.5 MG tablet TAKE ONE TABLET BY MOUTH AT BEDTIME. 90 tablet 0  . cyanocobalamin (,VITAMIN B-12,) 1000 MCG/ML injection INJECT 1 ML INTO THE MUSCLE ONCE MONTHLY AS DIRECTED. (Patient taking differently: Inject 1,000 mcg into the muscle every 30 (thirty) days. ) 1 mL 5  . dexamethasone (DECADRON) 2 MG tablet Take 1 tablet (2 mg total) by mouth daily. 30 tablet 3  . gabapentin (NEURONTIN) 600 MG tablet TAKE ONE TABLET BY MOUTH ONCE DAILY. 30 tablet 0  . gabapentin (NEURONTIN) 600 MG tablet Take 1 tablet (600 mg total) by mouth daily. 30 tablet 6  . guaiFENesin (MUCINEX) 600 MG 12 hr tablet Take 1 tablet (600 mg total) by mouth 2 (two) times daily as needed for cough or to loosen phlegm. (Patient not taking: Reported on 09/17/2019)    . ibandronate (BONIVA) 150 MG tablet Take 150 mg by mouth every 30 (thirty) days.     Marland Kitchen ibuprofen (ADVIL,MOTRIN) 800 MG tablet Take 800 mg every 8 (eight) hours as needed by mouth for moderate pain.    Marland Kitchen ipratropium-albuterol (DUONEB) 0.5-2.5 (3) MG/3ML SOLN Take 3 mLs by nebulization every 6 (six) hours as needed (shortness of breath). (Patient not taking: Reported on 09/17/2019) 360 mL 0  . lidocaine-prilocaine (EMLA) cream Apply a small amount to port a cath site and cover with plastic wrap 1 hour prior to chemotherapy appointments (Patient not taking:  Reported on 09/17/2019) 30 g 3  . methocarbamol (ROBAXIN) 500 MG tablet Take 500 mg by mouth every 6 (six)  hours as needed for muscle spasms.     . nitroGLYCERIN (NITRODUR - DOSED IN MG/24 HR) 0.1 mg/hr patch Place 0.1 mg onto the skin daily.     . ondansetron (ZOFRAN ODT) 8 MG disintegrating tablet Take 1 tablet (8 mg total) by mouth every 8 (eight) hours as needed for nausea or vomiting. (Patient not taking: Reported on 09/17/2019) 30 tablet 2  . ondansetron (ZOFRAN) 8 MG tablet Take 1 tablet (8 mg total) by mouth every 8 (eight) hours as needed for nausea or vomiting. (Patient not taking: Reported on 09/17/2019) 20 tablet 5  . oxyCODONE-acetaminophen (PERCOCET/ROXICET) 5-325 MG tablet Take 1 tablet by mouth every 8 (eight) hours as needed for severe pain. 56 tablet 0  . OXYGEN Inhale 3 L into the lungs daily.    . pantoprazole (PROTONIX) 40 MG tablet TAKE ONE TABLET BY MOUTH TWICE DAILY. 60 tablet 0  . polyethylene glycol (MIRALAX / GLYCOLAX) 17 g packet Take 17 g by mouth daily as needed for mild constipation.    . potassium chloride SA (KLOR-CON) 20 MEQ tablet Take 1 tablet (20 mEq total) by mouth 3 (three) times daily. 60 tablet 2  . prochlorperazine (COMPAZINE) 10 MG tablet Take 1 tablet (10 mg total) by mouth every 6 (six) hours as needed (Nausea or vomiting). (Patient not taking: Reported on 09/17/2019) 30 tablet 1  . rizatriptan (MAXALT) 5 MG tablet SMARTSIG:1-2 Tablet(s) By Mouth 1 to 2 Times Daily PRN    . rosuvastatin (CRESTOR) 40 MG tablet Take 40 mg at bedtime by mouth.     . traMADol (ULTRAM) 50 MG tablet Take 50 mg by mouth every 6 (six) hours as needed.     No current facility-administered medications for this visit.   Facility-Administered Medications Ordered in Other Visits  Medication Dose Route Frequency Provider Last Rate Last Admin  . sodium chloride flush (NS) 0.9 % injection 10 mL  10 mL Intravenous PRN Derek Jack, MD   10 mL at 10/20/19 1412    ALLERGIES:  Allergies  Allergen Reactions  . Penicillins Hives and Rash    Did it involve swelling of the  face/tongue/throat, SOB, or low BP? No Did it involve sudden or severe rash/hives, skin peeling, or any reaction on the inside of your mouth or nose? No Did you need to seek medical attention at a hospital or doctor's office? No When did it last happen?5-10 year If all above answers are "NO", may proceed with cephalosporin use.    . Tape Rash    Medipore, Coban, and paper tape CAN be tolerated    PHYSICAL EXAM:  Performance status (ECOG): 1 - Symptomatic but completely ambulatory  There were no vitals filed for this visit. Wt Readings from Last 3 Encounters:  09/17/19 44.5 kg (98 lb 3.2 oz)  08/31/19 47 kg (103 lb 9.6 oz)  08/21/19 48.5 kg (107 lb)   Physical Exam   LABORATORY DATA:  I have reviewed the labs as listed.  CBC Latest Ref Rng & Units 10/20/2019 09/17/2019 08/03/2019  WBC 4.0 - 10.5 K/uL 6.8 8.2 5.6  Hemoglobin 12.0 - 15.0 g/dL 10.8(L) 12.3 11.7(L)  Hematocrit 36 - 46 % 33.9(L) 38.7 37.9  Platelets 150 - 400 K/uL 387 473(H) 356   CMP Latest Ref Rng & Units 10/20/2019 09/17/2019 08/03/2019  Glucose 70 - 99 mg/dL 172(H) 134(H) 183(H)  BUN 8 - 23 mg/dL '14 13 18  ' Creatinine 0.44 - 1.00 mg/dL 0.95 0.87 1.13(H)  Sodium 135 - 145 mmol/L 132(L) 136 137  Potassium 3.5 - 5.1 mmol/L 3.3(L) 3.1(L) 2.9(L)  Chloride 98 - 111 mmol/L 100 100 103  CO2 22 - 32 mmol/L '23 26 24  ' Calcium 8.9 - 10.3 mg/dL 8.5(L) 8.8(L) 8.9  Total Protein 6.5 - 8.1 g/dL 6.4(L) 6.8 6.7  Total Bilirubin 0.3 - 1.2 mg/dL 0.4 0.6 0.4  Alkaline Phos 38 - 126 U/L 66 73 89  AST 15 - 41 U/L '22 18 21  ' ALT 0 - 44 U/L '14 11 11    ' DIAGNOSTIC IMAGING:  I have independently reviewed the scans and discussed with the patient. MR Cervical Spine W Wo Contrast  Addendum Date: 10/15/2019   ADDENDUM REPORT: 10/15/2019 09:30 ADDENDUM: Contrast was administered via a port which was accessed by a registered nurse. Electronically Signed   By: Titus Dubin M.D.   On: 10/15/2019 09:30   Result Date: 10/15/2019 CLINICAL  DATA:  History of lung cancer. Possible C3 lesion seen on recent brain MRI. History of prior cervical fusion. EXAM: MRI CERVICAL SPINE WITHOUT AND WITH CONTRAST TECHNIQUE: Multiplanar and multiecho pulse sequences of the cervical spine, to include the craniocervical junction and cervicothoracic junction, were obtained without and with intravenous contrast. CONTRAST:  87m MULTIHANCE GADOBENATE DIMEGLUMINE 529 MG/ML IV SOLN COMPARISON:  PET-CT dated September 28, 2019. MRI cervical spine dated Aug 14, 2016. FINDINGS: Alignment: Physiologic. Vertebrae: Nearly confluent marrow edema and enhancement with corresponding decreased T1 marrow signal involving the C3 vertebral body and left articular process. There is adjacent marrow edema within the C4 superior endplate and left articular processes. There is no discrete bone lesion or associated soft tissue mass. No evidence of discitis. Prior C4-T1 ACDF. Cord: Normal signal and morphology.  No intradural enhancement. Posterior Fossa, vertebral arteries, paraspinal tissues: Negative. Disc levels: C2-C3: Minimal disc bulging, decreased when compared to prior study. Unchanged mild bilateral uncovertebral hypertrophy and moderate left facet arthropathy. Unchanged mild left neuroforaminal stenosis. No spinal right neuroforaminal stenosis. C3-C4: Progressive disc height loss and disc bulging, moderate bilateral uncovertebral hypertrophy, and moderate left facet arthropathy with small synovial cyst projecting into the posterior paraspinous soft tissues. New moderate spinal canal stenosis. Progressed moderate to severe left neuroforaminal stenosis. No right neuroforaminal stenosis. C4-C5:  Interval ACDF.  No residual stenosis. C5-C6:  Interval ACDF.  No residual stenosis. C6-C7:  Interval ACDF.  No residual stenosis. C7-T1:  Interval ACDF.  No residual stenosis. IMPRESSION: 1. Nearly confluent marrow edema and enhancement involving the C3 vertebral body and left articular process without  discrete bone lesion or associated soft tissue mass. Given marrow edema in the adjacent C4 superior endplate and left articular process, and lack of uptake on recent PET-CT, appearance is most likely degenerative in etiology due to progressive now severe adjacent segment degenerative disc disease at C3-C4. 2. Progressive now moderate spinal canal and moderate to severe left neuroforaminal stenosis at C3-C4. 3. Prior C4-T1 ACDF without residual stenosis. Electronically Signed: By: WTitus DubinM.D. On: 10/13/2019 13:29   NM PET Image Restag (PS) Skull Base To Thigh  Result Date: 09/28/2019 CLINICAL DATA:  Subsequent treatment strategy for restaging of right-sided small-cell lung cancer. COVID-19 vaccine 2/21. EXAM: NUCLEAR MEDICINE PET SKULL BASE TO THIGH TECHNIQUE: 4.9 mCi F-18 FDG was injected intravenously. Full-ring PET imaging was performed from the skull base to thigh after the radiotracer. CT data was obtained and used  for attenuation correction and anatomic localization. Fasting blood glucose: 73 mg/dl COMPARISON:  09/15/2019 chest CT.  Most recent PET of 01/26/2019. FINDINGS: Mediastinal blood pool activity: SUV max 2.8 Liver activity: SUV max NA NECK: No areas of abnormal hypermetabolism. Incidental CT findings: Bilateral carotid atherosclerosis. No cervical adenopathy. CHEST: Multifocal right-sided low-level pleural hypermetabolism in the setting of relatively diffuse pleural thickening. Examples superiorly at a S.U.V. max of 2.2 on approximately image 66/4 and inferiorly at a S.U.V. max of 2.6 on approximately image 86/4. A right lower lobe pulmonary nodule measures 6 mm and a S.U.V. max of 1.2 on 65/4. Similar in size to on the prior diagnostic CT. Anterior right lung base pleural or subpleural nodule measures 8 mm and a S.U.V. max of 2.0 on 83/4. Low-level hypermetabolism is identified along the medial right lung surgical sutures, including at a S.U.V. max of 3.3 on 74/4. Subcarinal node measures  8 mm and a S.U.V. max of 3.1 on 72/4. Incidental CT findings: Right upper lobectomy. Moderate centrilobular emphysema. Innumerable small pulmonary nodules which are below PET resolution as detailed on prior diagnostic CT. ABDOMEN/PELVIS: No abdominopelvic parenchymal or nodal hypermetabolism. Incidental CT findings: 2.1 cm right adrenal low-density nodule, consistent with an adenoma. Hyperattenuation in the lower pole left renal collecting system measures 2.0 cm, including on 120/4. This is been detailed on the PET of 10/24/2018 and may have been present back to 01/21/2017. Bilateral low-density renal lesions are likely cysts. Pancreatic parenchymal calcifications likely relate to chronic calcific pancreatitis. Large colonic stool burden. Pelvic floor laxity. SKELETON: No abnormal marrow activity. Incidental CT findings: Osteopenia. Lumbar spine fixation. Cervical spine fixation. IMPRESSION: 1. Bilateral pulmonary nodules, primarily below PET resolution. Small right-sided nodular densities which demonstrate low-level hypermetabolism. Constellation of findings are suspicious for pulmonary metastasis. 2. Mild hypermetabolism corresponding to a normal size subcarinal node, indeterminate. 3. Multifocal right pleural hypermetabolism in the setting of pleural thickening and prior right upper lobectomy, indeterminate. 4. No hypermetabolic extrathoracic metastasis identified. 5. Chronic hyperattenuation in the lower pole left kidney, favored to be related to a benign in disease such as the sequelae of prior infection. Correlate with urinalysis if not already performed. 6. Aortic atherosclerosis (ICD10-I70.0), coronary artery atherosclerosis and emphysema (ICD10-J43.9). 7. Right adrenal adenoma. Electronically Signed   By: Abigail Miyamoto M.D.   On: 09/28/2019 12:19     ASSESSMENT:  1. 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 -30 Gy of radiation in 10 fractions from 01/28/2019 through 02/10/2019. -4 cycles of carboplatin and etoposide from 03/09/2019 through 05/11/2019. -PCI completed on 07/31/2019. -We reviewed MRI of the brain on 09/15/2019 with no evidence of brain meta stasis.  Concern for hypointense appearance at the C3 vertebral body and left articular process. -CT chest on 09/15/2019 shows numerous new small solid irregular pulmonary nodules scattered throughout both lungs, largest 7 mm on the right lower lobe.  Tiny loculated anterior right pleural effusion decreased.  Stable mild subcarinal adenopathy.  Bilateral stable adrenal adenomas. -MRI of the C-spine on 10/13/2019 showed marrow edema and enhancement involving C3 vertebral body and left articular process without discrete bone lesion.  This was thought to be degenerative. -PET scan on 09/28/2019 shows bilateral lung nodules, below PET resolution.  Small right-sided nodular densities demonstrating low-level hypermetabolism.  Mild hypermetabolic corresponding  to a normal-sized subcarinal lymph node.  Multifocal right pleural hypermetabolism in the setting of pleural thickening and prior right upper lobectomy, indeterminate.  No hypermetabolic extrathoracic disease.   PLAN:  1. Stage III (T3N1) small cell lung cancer: -I have reviewed the images and discussed the results of the PET scan and MRI with the patient and her daughter. -There is no clear area to biopsy to prove recurrence at this time.  Biopsy is also associated with risk of pneumothorax. -After discussing risks and benefits, we have decided to repeat scan in 2 months to see if there is any progression.  2. Essential thrombocytosis: -Hydroxyurea on hold since chemotherapy for lung cancer. -Platelet count today is 387.  3. Macrocytic  anemia: -Hemoglobin today decreased to 10.8 from 12.3 previously. -MCV is 87.1.  No bleeding reported. -I plan to repeat CBC in 1 month with anemia labs.   Orders placed this encounter:  No orders of the defined types were placed in this encounter.    Derek Jack, MD [place] [phone]   I, Milinda Antis, am acting as a scribe for Dr. Sanda Linger.  I, Derek Jack MD, have reviewed the above documentation for accuracy and completeness, and I agree with the above.

## 2019-10-21 ENCOUNTER — Other Ambulatory Visit (HOSPITAL_COMMUNITY): Payer: Self-pay | Admitting: Hematology

## 2019-10-21 DIAGNOSIS — Z1231 Encounter for screening mammogram for malignant neoplasm of breast: Secondary | ICD-10-CM

## 2019-10-23 ENCOUNTER — Other Ambulatory Visit (HOSPITAL_COMMUNITY): Payer: Self-pay | Admitting: Nurse Practitioner

## 2019-10-29 ENCOUNTER — Other Ambulatory Visit (HOSPITAL_COMMUNITY): Payer: Self-pay | Admitting: Hematology

## 2019-11-04 ENCOUNTER — Other Ambulatory Visit: Payer: Self-pay

## 2019-11-04 ENCOUNTER — Ambulatory Visit (HOSPITAL_COMMUNITY)
Admission: RE | Admit: 2019-11-04 | Discharge: 2019-11-04 | Disposition: A | Discharge status: Home / Self Care | Payer: Medicare Other | Source: Ambulatory Visit | Attending: Hematology | Admitting: Hematology

## 2019-11-04 DIAGNOSIS — Z1231 Encounter for screening mammogram for malignant neoplasm of breast: Secondary | ICD-10-CM | POA: Diagnosis present

## 2019-11-17 ENCOUNTER — Inpatient Hospital Stay (HOSPITAL_COMMUNITY): Payer: Medicare Other | Attending: Hematology | Admitting: Hematology

## 2019-11-17 ENCOUNTER — Inpatient Hospital Stay (HOSPITAL_COMMUNITY): Payer: Medicare Other

## 2019-11-17 ENCOUNTER — Other Ambulatory Visit: Payer: Self-pay

## 2019-11-17 VITALS — BP 94/71 | HR 82 | Temp 96.9°F | Resp 18 | Wt 98.3 lb

## 2019-11-17 DIAGNOSIS — D539 Nutritional anemia, unspecified: Secondary | ICD-10-CM | POA: Diagnosis not present

## 2019-11-17 DIAGNOSIS — R634 Abnormal weight loss: Secondary | ICD-10-CM | POA: Diagnosis not present

## 2019-11-17 DIAGNOSIS — C3491 Malignant neoplasm of unspecified part of right bronchus or lung: Secondary | ICD-10-CM | POA: Diagnosis not present

## 2019-11-17 DIAGNOSIS — C3411 Malignant neoplasm of upper lobe, right bronchus or lung: Secondary | ICD-10-CM | POA: Insufficient documentation

## 2019-11-17 DIAGNOSIS — Z5111 Encounter for antineoplastic chemotherapy: Secondary | ICD-10-CM | POA: Insufficient documentation

## 2019-11-17 DIAGNOSIS — Z79899 Other long term (current) drug therapy: Secondary | ICD-10-CM | POA: Diagnosis not present

## 2019-11-17 DIAGNOSIS — E876 Hypokalemia: Secondary | ICD-10-CM | POA: Diagnosis not present

## 2019-11-17 DIAGNOSIS — D473 Essential (hemorrhagic) thrombocythemia: Secondary | ICD-10-CM | POA: Insufficient documentation

## 2019-11-17 LAB — IRON AND TIBC
Iron: 73 ug/dL (ref 28–170)
Saturation Ratios: 26 % (ref 10.4–31.8)
TIBC: 281 ug/dL (ref 250–450)
UIBC: 208 ug/dL

## 2019-11-17 LAB — COMPREHENSIVE METABOLIC PANEL
ALT: 13 U/L (ref 0–44)
AST: 19 U/L (ref 15–41)
Albumin: 3.6 g/dL (ref 3.5–5.0)
Alkaline Phosphatase: 59 U/L (ref 38–126)
Anion gap: 8 (ref 5–15)
BUN: 14 mg/dL (ref 8–23)
CO2: 24 mmol/L (ref 22–32)
Calcium: 8.9 mg/dL (ref 8.9–10.3)
Chloride: 104 mmol/L (ref 98–111)
Creatinine, Ser: 0.89 mg/dL (ref 0.44–1.00)
GFR calc Af Amer: >60 mL/min (ref >60)
GFR calc non Af Amer: >60 mL/min (ref >60)
Glucose, Bld: 156 mg/dL — ABNORMAL HIGH (ref 70–99)
Potassium: 3.4 mmol/L — ABNORMAL LOW (ref 3.5–5.1)
Sodium: 136 mmol/L (ref 135–145)
Total Bilirubin: 0.6 mg/dL (ref 0.3–1.2)
Total Protein: 6.8 g/dL (ref 6.5–8.1)

## 2019-11-17 LAB — CBC WITH DIFFERENTIAL/PLATELET
Abs Immature Granulocytes: 0.01 10*3/uL (ref 0.00–0.07)
Basophils Absolute: 0 10*3/uL (ref 0.0–0.1)
Basophils Relative: 1 %
Eosinophils Absolute: 0.2 10*3/uL (ref 0.0–0.5)
Eosinophils Relative: 2 %
HCT: 35.8 % — ABNORMAL LOW (ref 36.0–46.0)
Hemoglobin: 11.4 g/dL — ABNORMAL LOW (ref 12.0–15.0)
Immature Granulocytes: 0 %
Lymphocytes Relative: 17 %
Lymphs Abs: 1.1 10*3/uL (ref 0.7–4.0)
MCH: 28.9 pg (ref 26.0–34.0)
MCHC: 31.8 g/dL (ref 30.0–36.0)
MCV: 90.9 fL (ref 80.0–100.0)
Monocytes Absolute: 0.5 10*3/uL (ref 0.1–1.0)
Monocytes Relative: 7 %
Neutro Abs: 4.8 10*3/uL (ref 1.7–7.7)
Neutrophils Relative %: 73 %
Platelets: 384 10*3/uL (ref 150–400)
RBC: 3.94 MIL/uL (ref 3.87–5.11)
RDW: 17.3 % — ABNORMAL HIGH (ref 11.5–15.5)
WBC: 6.6 10*3/uL (ref 4.0–10.5)
nRBC: 0 % (ref 0.0–0.2)

## 2019-11-17 LAB — VITAMIN B12: Vitamin B-12: 393 pg/mL (ref 180–914)

## 2019-11-17 LAB — FOLATE: Folate: 13.3 ng/mL (ref >5.9)

## 2019-11-17 LAB — FERRITIN: Ferritin: 139 ng/mL (ref 11–307)

## 2019-11-17 MED ORDER — HEPARIN SOD (PORK) LOCK FLUSH 100 UNIT/ML IV SOLN
500.0000 [IU] | Freq: Once | INTRAVENOUS | Status: AC
  Administered 2019-11-17: 500 [IU] via INTRAVENOUS

## 2019-11-17 MED ORDER — SODIUM CHLORIDE 0.9% FLUSH
10.0000 mL | Freq: Once | INTRAVENOUS | Status: AC
  Administered 2019-11-17: 10 mL via INTRAVENOUS

## 2019-11-17 NOTE — Patient Instructions (Signed)
[place] at [facility] Discharge Instructions  You were seen today by Dr. Delton Coombes. He went over your recent results. Please drink 1 can of Boost or Ensure daily and drink plenty of water. Dr. Delton Coombes will see you back in 1 week for follow up.   Thank you for choosing [place] at [facility] to provide your oncology and hematology care.  To afford each patient quality time with our provider, please arrive at least 15 minutes before your scheduled appointment time.   If you have a lab appointment with the [facility] please come in thru the Main Entrance and check in at the main information desk  You need to re-schedule your appointment should you arrive 10 or more minutes late.  We strive to give you quality time with our providers, and arriving late affects you and other patients whose appointments are after yours.  Also, if you no show three or more times for appointments you may be dismissed from the clinic at the providers discretion.     Again, thank you for choosing [facility].  Our hope is that these requests will decrease the amount of time that you wait before being seen by our physicians.       _____________________________________________________________  Should you have questions after your visit to [facility], please contact our office at [phone] between the hours of 8:00 a.m. and 4:30 p.m.  Voicemails left after 4:00 p.m. will not be returned until the following business day.  For prescription refill requests, have your pharmacy contact our office and allow 72 hours.    Cancer Center Support Programs:   > Cancer Support Group  2nd Tuesday of the month 1pm-2pm, Journey Room

## 2019-11-17 NOTE — Progress Notes (Signed)
[address] [place], [place] [address]   CLINIC:  Medical Oncology/Hematology  PCP:  [name], NP [address] / [place] [address] [phone]   REASON FOR VISIT:  Follow-up for small cell lung cancer  PRIOR THERAPY:  1. Right upper lobectomy on 12/30/2018. 2. 30 Gy of radiation in 10 fractions from 01/28/2019 through 02/10/2019. 3. Carboplatin and etoposide x 4 cycles from 03/09/2019 through 05/11/2019.  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 The 3 liver lesions are not hypermetabolic. The radiologist suspects they may be subtly present on prior CT chest from 10/2013, to further reassuring that these are likely benign lesions.   Melanoma ([facility])  07/09/2013 Initial Biopsy   Initial biopsy L upper thigh/buttocks melanoma   07/20/2013 Surgery   Excision L lateral buttock/upper thigh melanoma, clear margins   07/20/2013 Pathology Results   Breslow depth 1.02 mm, pT2a    Small cell lung carcinoma, right ([facility])  11/27/2018 Initial Diagnosis   Small cell lung carcinoma, right ([facility])   03/09/2019 -  Chemotherapy   The patient had palonosetron (ALOXI) injection 0.25 mg, 0.25 mg, Intravenous,  Once, 4 of 4 cycles Administration: 0.25 mg (03/09/2019), 0.25 mg (03/30/2019), 0.25 mg (04/20/2019), 0.25 mg (05/11/2019) pegfilgrastim (NEULASTA ONPRO KIT) injection 6 mg, 6 mg, Subcutaneous, Once, 1 of 1  cycle Administration: 6 mg (03/11/2019) pegfilgrastim-jmdb (FULPHILA) injection 6 mg, 6 mg, Subcutaneous,  Once, 3 of 3 cycles Administration: 6 mg (04/03/2019), 6 mg (04/24/2019), 6 mg (05/15/2019) CARBOplatin (PARAPLATIN) 330 mg in sodium chloride 0.9 % 250 mL chemo infusion, 330 mg (100 % of original dose 325.5 mg), Intravenous,  Once, 4 of 4 cycles Dose modification:   (original dose 325.5 mg, Cycle 1),   (original dose 325.5 mg, Cycle 2),   (original dose 309 mg, Cycle 3),   (original dose 325.5 mg, Cycle 4) Administration: 330 mg (03/09/2019), 330 mg (03/30/2019), 310 mg (04/20/2019), 330 mg (05/11/2019) etoposide (VEPESID) 150 mg in sodium chloride 0.9 % 500 mL chemo infusion, 100 mg/m2 = 150 mg, Intravenous,  Once, 4 of 4 cycles Administration: 150 mg (03/09/2019), 150 mg (03/10/2019), 150 mg (03/11/2019), 150 mg (03/30/2019), 150 mg (03/31/2019), 150 mg (04/01/2019), 150 mg (04/20/2019), 150 mg (04/21/2019), 150 mg (04/22/2019), 150 mg (05/11/2019), 150 mg (05/12/2019), 150 mg (05/13/2019) fosaprepitant (EMEND) 150 mg, dexamethasone (DECADRON) 12 mg in sodium chloride 0.9 % 145 mL IVPB, , Intravenous,  Once, 4 of 4 cycles Administration:  (03/09/2019),  (03/30/2019),  (04/20/2019),  (05/11/2019)  for chemotherapy treatment.    03/09/2019 Cancer Staging   Staging form: Lung, AJCC 8th Edition - Clinical: Stage IIIA (cT3, cN1, cM0) - Signed by Derek Jack, MD on 03/09/2019     CANCER STAGING: Cancer Staging Small cell lung carcinoma, right ([facility]) Staging form: Lung, AJCC 8th Edition - Clinical: Stage IIIA (cT3, cN1, cM0) - Signed by Derek Jack, MD  on 03/09/2019   INTERVAL HISTORY:  Ashley [name], a 69 y.o. female, returns for routine follow-up of her small cell lung cancer. Ashley [name] was last seen on 10/20/2019.   Today she is accompanied by her daughter. Her appetite is not very good and she is not drinking Boost or Ensure. She is eating small meals throughout the day. She is  nauseous most days and takes Zofran daily.  She is scheduled for a PET scan on 8/10.   REVIEW OF SYSTEMS:  Review of Systems  Constitutional: Positive for appetite change (severely decreased) and fatigue (severe).  HENT:   Positive for mouth sores.   Respiratory: Positive for shortness of breath (w/ exertion).   Cardiovascular: Positive for chest pain.  Gastrointestinal: Positive for constipation and nausea (most days).  Neurological: Positive for headaches and numbness (feet).  All other systems reviewed and are negative.   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 No evidence of ischemia/injury based upon normal troponin I. Chest discomfort could be tachycardia induced supply demand mismatch.    CHF (congestive heart failure) ([facility]) 11/17/2015   after surgery    Colon adenomas    Coronary artery disease    DJD (degenerative joint disease)    Dyspnea    with exertion   Emphysema of lung ([facility]) 11/17/2015   Essential hypertension, benign    GERD (gastroesophageal reflux disease)    History of hiatal hernia    Hyperlipidemia    Hypertension    Melanoma ([facility]) 01/09/2016   Osteopenia    Palpitations    Pernicious anemia 03/06/2016   Pernicious anemia    Pure hypercholesterolemia    Raynaud's disease    Thrombocythemia, essential ([facility]) 01/09/2016   Thrombocytosis (HCC)    Idiopathic   Vitamin D deficiency    Past Surgical History:  Procedure Laterality Date   ABDOMINAL HYSTERECTOMY     ANTERIOR AND POSTERIOR REPAIR N/A 12/09/2014   Procedure: ANTERIOR (CYSTOCELE) AND POSTERIOR REPAIR (RECTOCELE);  Surgeon: Bjorn Loser, MD;  Location: [facility];  Service: Urology;  Laterality: N/A;   ANTERIOR CERVICAL  DECOMPRESSION/DISCECTOMY FUSION 4 LEVELS Right 10/03/2016   Procedure: ANTERIOR CERVICAL DECOMPRESSION FUSION, CERVICAL 4-5, CERVICAL 5-6, CERVICAL 6-7, CERVICAL 7 TO THORACIC 1 WITH INSTRUMENTATION AND ALLOGRAFT;  Surgeon: Phylliss Bob, MD;  Location: [facility];  Service: Orthopedics;  Laterality: Right;  ANTERIOR CERVICAL DECOMPRESSION FUSION, CERVICAL 4-5, CERVICAL 5-6, CERVICAL 6-7, CERVICAL 7 TO THORACIC 1 WITH INSTRUMENTATION AND ALLOGRAFT; REQUEST 4 HO   ANTERIOR LAT LUMBAR FUSION Left 11/16/2015   Procedure: LEFT SIDED LATERAL INTERBODY FUSION, LUMBAR 2-3, LUMBAR 3-4, LUMBAR 4-5 WITH INSTRUMENTATION;  Surgeon: Phylliss Bob, MD;  Location: [facility];  Service: Orthopedics;  Laterality: Left;  LEFT SIDED LATEARL INTERBODY FUSION, LUMBAR 2-3, LUMBAR 3-4, LUMBAR 4-5 WITH INSTRUMENTATION    APPENDECTOMY     BACK SURGERY     BONE MARROW ASPIRATION  07/2012   BONE MARROW BIOPSY  07/2012   BREAST SURGERY     CARDIAC CATHETERIZATION     CARDIAC CATHETERIZATION N/A 01/20/2016   Procedure: Left Heart Cath and Coronary Angiography;  Surgeon: Burnell Blanks, MD;  Location: [facility];  Service: Cardiovascular;  Laterality: N/A;   COLONOSCOPY  11/29/2010   Procedure: COLONOSCOPY;  Surgeon: Rogene Houston, MD;  [facility];  Service: Endoscopy;  Laterality: N/A;   COLONOSCOPY N/A 02/18/2014   Procedure: COLONOSCOPY;  Surgeon: Rogene Houston, MD;  [facility];  Service: Endoscopy;  Laterality: N/A;  1030   COLONOSCOPY N/A 02/25/2017   Procedure: COLONOSCOPY;  Surgeon: Rogene Houston, MD;  [facility];  Service: Endoscopy;  Laterality: N/A;  10:55   CYSTOSCOPY N/A 12/09/2014   Procedure: CYSTOSCOPY;  Surgeon: Bjorn Loser, MD;  Location: [facility];  Service: Urology;  Laterality: N/A;   ESOPHAGEAL DILATION N/A 02/25/2017   Procedure: ESOPHAGEAL DILATION;  Surgeon: Rogene Houston, MD;  [facility];  Service: Endoscopy;  Laterality: N/A;    ESOPHAGOGASTRODUODENOSCOPY N/A 02/25/2017   Procedure: ESOPHAGOGASTRODUODENOSCOPY (EGD);  Surgeon: Rogene Houston, MD;  [facility];  Service: Endoscopy;  Laterality: N/A;   IR GENERIC HISTORICAL  01/11/2016   IR RADIOLOGY PERIPHERAL GUIDED IV START 01/11/2016 Saverio Danker, PA-C [facility]   IR GENERIC HISTORICAL  01/11/2016   IR US GUIDE VASC ACCESS RIGHT 01/11/2016 Saverio Danker, PA-C [facility]   LYMPH NODE DISSECTION Right 12/30/2018   Procedure: Lymph Node Dissection;  Surgeon: Lajuana Matte, MD;  Location: [facility];  Service: Thoracic;  Laterality: Right;   MASTECTOMY     left   OVARIAN CYST SURGERY     x2   POLYPECTOMY  02/25/2017   Procedure: POLYPECTOMY;  Surgeon: Rogene Houston, MD;  [facility];  Service: Endoscopy;;   PORTACATH PLACEMENT Left 02/16/2019   Procedure: INSERTION PORT-A-CATH (CATHETER  LEFT SUBCLAVIAN);  Surgeon: Aviva Signs, MD;  Location: [facility];  Service: General;  Laterality: Left;   SALPINGOOPHORECTOMY Bilateral 12/09/2014   Procedure: SALPINGO OOPHORECTOMY;  Surgeon: Servando Salina, MD;  Location: [facility];  Service: Gynecology;  Laterality: Bilateral;   TUBAL LIGATION     VAGINAL HYSTERECTOMY N/A 12/09/2014   Procedure: HYSTERECTOMY VAGINAL;  Surgeon: Servando Salina, MD;  Location: [facility];  Service: Gynecology;  Laterality: N/A;   VIDEO ASSISTED THORACOSCOPY (VATS)/ LOBECTOMY Right 12/30/2018   Procedure: VIDEO ASSISTED THORACOSCOPY (VATS)/RIGHT LOWER LOBE WEDGE RESECTION, RIGHT UPPER LOBECTOMY;  Surgeon: Lajuana Matte, MD;  Location: [facility];  Service: Thoracic;  Laterality: Right;   VIDEO BRONCHOSCOPY N/A 12/30/2018   Procedure: VIDEO BRONCHOSCOPY;  Surgeon: Lajuana Matte, MD;  [facility];  Service: Thoracic;  Laterality: N/A;    SOCIAL HISTORY:  Social History   Socioeconomic History   Marital status: Divorced    Spouse name: Not on file   Number of children: 2   Years of  education: Not on file   Highest education level: Not on file  Occupational History   Not on file  Tobacco Use   Smoking status: Former Smoker    Packs/day: 0.50    Years: 50.00    Pack years: 25.00    Types: Cigarettes    Quit date: 11/25/2018    Years since quitting: 0.9   Smokeless tobacco: Never Used  Vaping Use   Vaping Use: Never used  Substance and Sexual Activity   Alcohol use: Yes    Comment: occasional   Drug use: No   Sexual activity: Yes    Birth control/protection: Post-menopausal  Other Topics Concern   Not on file  Social History Narrative   Not on file   Social Determinants of Health   Financial Resource Strain:  Difficulty of Paying Living Expenses:   Food Insecurity:    Worried About Charity fundraiser in the Last Year:    Arboriculturist in the Last Year:   Transportation Needs:    Film/video editor (Medical):    Lack of Transportation (Non-Medical):   Physical Activity:    Days of Exercise per Week:    Minutes of Exercise per Session:   Stress:    Feeling of Stress :   Social Connections:    Frequency of Communication with Friends and Family:    Frequency of Social Gatherings with Friends and Family:    Attends Religious Services:    Active Member of Clubs or Organizations:    Attends Music therapist:    Marital Status:   Intimate Partner Violence:    Fear of Current or Ex-Partner:    Emotionally Abused:    Physically Abused:    Sexually Abused:     FAMILY HISTORY:  Family History  Problem Relation Age of Onset   Hypertension Mother    Heart failure Mother    Congestive Heart Failure Mother    COPD Mother    Pernicious anemia Mother    Cancer Mother        lung   Hypertension Father    CAD Father    Heart attack Father    Hypertension Sister    Cancer Other    Celiac disease Other     CURRENT MEDICATIONS:  Current Outpatient Medications  Medication Sig Dispense  Refill   albuterol (PROVENTIL HFA;VENTOLIN HFA) 108 (90 Base) MCG/ACT inhaler Inhale 2 puffs into the lungs every 6 (six) hours as needed for wheezing or shortness of breath. 1 Inhaler 2   aspirin EC 81 MG tablet Take 81 mg by mouth daily.     Calcium Carb-Cholecalciferol (CALCIUM 600+D) 600-800 MG-UNIT TABS Take 1 tablet by mouth 2 (two) times daily.     Cholecalciferol (VITAMIN D-3) 1000 units CAPS Take 1,000 Units daily by mouth.      clonazePAM (KLONOPIN) 0.5 MG tablet TAKE ONE TABLET BY MOUTH AT BEDTIME. 90 tablet 0   cyanocobalamin (,VITAMIN B-12,) 1000 MCG/ML injection INJECT 1 ML INTO THE MUSCLE ONCE MONTHLY AS DIRECTED. (Patient taking differently: Inject 1,000 mcg into the muscle every 30 (thirty) days. ) 1 mL 5   dexamethasone (DECADRON) 2 MG tablet Take 1 tablet (2 mg total) by mouth daily. 30 tablet 3   dronabinol (MARINOL) 2.5 MG capsule Take 1 capsule (2.5 mg total) by mouth 2 (two) times daily before a meal. 60 capsule 3   fluconazole (DIFLUCAN) 150 MG tablet Take 150 mg by mouth daily.     gabapentin (NEURONTIN) 600 MG tablet TAKE ONE TABLET BY MOUTH ONCE DAILY. 30 tablet 0   gabapentin (NEURONTIN) 600 MG tablet Take 1 tablet (600 mg total) by mouth daily. 30 tablet 6   guaiFENesin (MUCINEX) 600 MG 12 hr tablet Take 1 tablet (600 mg total) by mouth 2 (two) times daily as needed for cough or to loosen phlegm.     ibandronate (BONIVA) 150 MG tablet Take 150 mg by mouth every 30 (thirty) days.      ibuprofen (ADVIL,MOTRIN) 800 MG tablet Take 800 mg by mouth every 8 (eight) hours as needed for moderate pain.      ipratropium-albuterol (DUONEB) 0.5-2.5 (3) MG/3ML SOLN Take 3 mLs by nebulization every 6 (six) hours as needed (shortness of breath). 360 mL 0   lidocaine-prilocaine (EMLA) cream  Apply a small amount to port a cath site and cover with plastic wrap 1 hour prior to chemotherapy appointments 30 g 3   methocarbamol (ROBAXIN) 500 MG tablet Take 500 mg by mouth  every 6 (six) hours as needed for muscle spasms.      nitroGLYCERIN (NITRODUR - DOSED IN MG/24 HR) 0.1 mg/hr patch Place 0.1 mg onto the skin daily.      ondansetron (ZOFRAN) 8 MG tablet Take 1 tablet (8 mg total) by mouth every 8 (eight) hours as needed for nausea or vomiting. 20 tablet 5   ondansetron (ZOFRAN-ODT) 8 MG disintegrating tablet TAKE (1) TABLET BY MOUTH EVERY EIGHT HOURS AS NEEDED. 30 tablet 6   oxyCODONE-acetaminophen (PERCOCET/ROXICET) 5-325 MG tablet Take 1 tablet by mouth every 8 (eight) hours as needed for severe pain. 56 tablet 0   OXYGEN Inhale 3 L into the lungs daily.     pantoprazole (PROTONIX) 40 MG tablet TAKE ONE TABLET BY MOUTH TWICE DAILY. 60 tablet 0   polyethylene glycol (MIRALAX / GLYCOLAX) 17 g packet Take 17 g by mouth daily as needed for mild constipation.     potassium chloride SA (KLOR-CON) 20 MEQ tablet Take 1 tablet (20 mEq total) by mouth 3 (three) times daily. 60 tablet 2   prochlorperazine (COMPAZINE) 10 MG tablet Take 1 tablet (10 mg total) by mouth every 6 (six) hours as needed (Nausea or vomiting). 30 tablet 1   rizatriptan (MAXALT) 5 MG tablet SMARTSIG:1-2 Tablet(s) By Mouth 1 to 2 Times Daily PRN     rosuvastatin (CRESTOR) 40 MG tablet Take 40 mg at bedtime by mouth.      traMADol (ULTRAM) 50 MG tablet Take 50 mg by mouth every 6 (six) hours as needed.     No current facility-administered medications for this visit.    ALLERGIES:  Allergies  Allergen Reactions   Penicillins Hives and Rash    Did it involve swelling of the face/tongue/throat, SOB, or low BP? No Did it involve sudden or severe rash/hives, skin peeling, or any reaction on the inside of your mouth or nose? No Did you need to seek medical attention at a hospital or doctor's office? No When did it last happen?5-10 year If all above answers are NO, may proceed with cephalosporin use.     Tape Rash    Medipore, Coban, and paper tape CAN be tolerated     PHYSICAL EXAM:  Performance status (ECOG): 1 - Symptomatic but completely ambulatory  Vitals:   11/17/19 1431  BP: 94/71  Pulse: 82  Resp: 18  Temp: (!) 96.9 F (36.1 C)  SpO2: 99%   Wt Readings from Last 3 Encounters:  11/17/19 98 lb 5.2 oz (44.6 kg)  09/17/19 98 lb 3.2 oz (44.5 kg)  08/31/19 103 lb 9.6 oz (47 kg)   Physical Exam Vitals reviewed.  Constitutional:      Appearance: Normal appearance.  Cardiovascular:     Rate and Rhythm: Normal rate and regular rhythm.     Pulses: Normal pulses.     Heart sounds: Normal heart sounds.  Pulmonary:     Effort: Pulmonary effort is normal.     Breath sounds: Normal breath sounds.  Abdominal:     Palpations: Abdomen is soft.     Tenderness: There is no abdominal tenderness.  Musculoskeletal:     Right lower leg: No edema.     Left lower leg: No edema.  Lymphadenopathy:     Cervical: No cervical adenopathy.  Upper Body:     Right upper body: No supraclavicular or axillary adenopathy.     Left upper body: No supraclavicular or axillary adenopathy.  Neurological:     General: No focal deficit present.     Mental Status: She is alert and oriented to person, place, and time.  Psychiatric:        Mood and Affect: Mood normal.        Behavior: Behavior normal.      LABORATORY DATA:  I have reviewed the labs as listed.  CBC Latest Ref Rng & Units 11/17/2019 10/20/2019 09/17/2019  WBC 4.0 - 10.5 K/uL 6.6 6.8 8.2  Hemoglobin 12.0 - 15.0 g/dL 11.4(L) 10.8(L) 12.3  Hematocrit 36 - 46 % 35.8(L) 33.9(L) 38.7  Platelets 150 - 400 K/uL 384 387 473(H)   CMP Latest Ref Rng & Units 10/20/2019 09/17/2019 08/03/2019  Glucose 70 - 99 mg/dL 172(H) 134(H) 183(H)  BUN 8 - 23 mg/dL _0 Creatinine 0.44 - 1.00 mg/dL 0.95 0.87 1.13(H)  Sodium 135 - 145 mmol/L 132(L) 136 137  Potassium 3.5 - 5.1 mmol/L 3.3(L) 3.1(L) 2.9(L)  Chloride 98 - 111 mmol/L 100 100 103  CO2 22 - 32 mmol/L _1 Calcium 8.9 - 10.3 mg/dL 8.5(L) 8.8(L) 8.9   Total Protein 6.5 - 8.1 g/dL 6.4(L) 6.8 6.7  Total Bilirubin 0.3 - 1.2 mg/dL 0.4 0.6 0.4  Alkaline Phos 38 - 126 U/L 66 73 89  AST 15 - 41 U/L _2 ALT 0 - 44 U/L _3 Lab Results  Component Value Date   LDH 113 09/17/2019   LDH 197 (H) 01/20/2019   LDH 181 10/07/2018   Lab Results  Component Value Date   TIBC 268 06/29/2019   TIBC 279 03/02/2019   TIBC 345 12/25/2017   FERRITIN 195 06/29/2019   FERRITIN 107 03/02/2019   FERRITIN 42 12/25/2017   IRONPCTSAT 15 06/29/2019   IRONPCTSAT 24 03/02/2019   IRONPCTSAT 26 12/25/2017    DIAGNOSTIC IMAGING:  I have independently reviewed the scans and discussed with the patient. MM 3D SCREEN BREAST UNI RIGHT  Result Date: 11/05/2019 CLINICAL DATA:  Screening. EXAM: DIGITAL SCREENING UNILATERAL RIGHT MAMMOGRAM WITH CAD AND TOMO COMPARISON:  Previous exam(s). ACR Breast Density Category c: The breast tissue is heterogeneously dense, which may obscure small masses. FINDINGS: There are no findings suspicious for malignancy. Images were processed with CAD. IMPRESSION: No mammographic evidence of malignancy. A result letter of this screening mammogram will be mailed directly to the patient. RECOMMENDATION: Screening mammogram in one year. (Code:SM-B-01Y) BI-RADS CATEGORY  1: Negative. Electronically Signed   By: Abelardo Diesel M.D.   On: 11/05/2019 14:04     ASSESSMENT:  1. 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 -30 Gy of radiation in 10 fractions from 01/28/2019 through 02/10/2019. -4 cycles of carboplatin and etoposide from 03/09/2019 through 05/11/2019. -PCI completed on 07/31/2019. -We reviewed MRI of the brain on 09/15/2019 with no evidence of brain meta stasis. Concern for  hypointense appearance at the C3 vertebral body and left articular process. -CT chest on 09/15/2019 shows numerous new small solid irregular pulmonary nodules scattered throughout both lungs, largest 7 mm on the right lower lobe. Tiny loculated anterior right pleural  effusion decreased. Stable mild subcarinal adenopathy. Bilateral stable adrenal adenomas. -MRI of the C-spine on 10/13/2019 showed marrow edema and enhancement involving C3 vertebral body and left articular process without discrete bone lesion.  This was thought to be degenerative. -PET scan on 09/28/2019 shows bilateral lung nodules, below PET resolution.  Small right-sided nodular densities demonstrating low-level hypermetabolism.  Mild hypermetabolic corresponding to a normal-sized subcarinal lymph node.  Multifocal right pleural hypermetabolism in the setting of pleural thickening and prior right upper lobectomy, indeterminate.  No hypermetabolic extrathoracic disease.   PLAN:  1. Stage III (T3N1) small cell lung cancer: -She is scheduled for PET scan next Tuesday.  I will see her back after the PET scan to review results and further plan.  2. Essential thrombocytosis: -Hydroxyurea on hold since chemotherapy for lung cancer.  Platelet count today is 384.  3. Macrocytic anemia: -Reviewed labs from today.  Hemoglobin improved 11.4.  MCV is 90.9. -Ferritin today is 139 with percent saturation of 26.  I77 and folic acid was normal.  4.  Weight loss: -Her weight has been stable at 98 pounds in the last 1 month. -I have encouraged her to drink protein supplements.   Orders placed this encounter:  No orders of the defined types were placed in this encounter.    Derek Jack, MD [place] [phone]   I, Milinda Antis, am acting as a scribe for Dr. Sanda Linger.  I, Derek Jack MD, have reviewed the above documentation for accuracy and completeness, and I agree with the above.

## 2019-11-24 ENCOUNTER — Other Ambulatory Visit: Payer: Self-pay

## 2019-11-24 ENCOUNTER — Ambulatory Visit (HOSPITAL_COMMUNITY)
Admission: RE | Admit: 2019-11-24 | Discharge: 2019-11-24 | Disposition: A | Discharge status: Home / Self Care | Payer: Medicare Other | Source: Ambulatory Visit | Attending: Hematology | Admitting: Hematology

## 2019-11-24 DIAGNOSIS — I2510 Atherosclerotic heart disease of native coronary artery without angina pectoris: Secondary | ICD-10-CM | POA: Diagnosis not present

## 2019-11-24 DIAGNOSIS — I700 Atherosclerosis of aorta: Secondary | ICD-10-CM | POA: Insufficient documentation

## 2019-11-24 DIAGNOSIS — C3491 Malignant neoplasm of unspecified part of right bronchus or lung: Secondary | ICD-10-CM | POA: Insufficient documentation

## 2019-11-24 DIAGNOSIS — D3501 Benign neoplasm of right adrenal gland: Secondary | ICD-10-CM | POA: Diagnosis not present

## 2019-11-24 DIAGNOSIS — J439 Emphysema, unspecified: Secondary | ICD-10-CM | POA: Insufficient documentation

## 2019-11-24 LAB — GLUCOSE, CAPILLARY
Glucose-Capillary: 68 mg/dL — ABNORMAL LOW (ref 70–99)
Glucose-Capillary: 87 mg/dL (ref 70–99)

## 2019-11-24 MED ORDER — FLUDEOXYGLUCOSE F - 18 (FDG) INJECTION
5.1300 | Freq: Once | INTRAVENOUS | Status: AC | PRN
  Administered 2019-11-24: 5.13 via INTRAVENOUS

## 2019-11-26 ENCOUNTER — Encounter: Payer: Self-pay | Admitting: Genetic Counselor

## 2019-11-30 ENCOUNTER — Other Ambulatory Visit (HOSPITAL_COMMUNITY): Payer: Self-pay | Admitting: Nurse Practitioner

## 2019-11-30 ENCOUNTER — Other Ambulatory Visit: Payer: Self-pay

## 2019-11-30 ENCOUNTER — Inpatient Hospital Stay (HOSPITAL_BASED_OUTPATIENT_CLINIC_OR_DEPARTMENT_OTHER): Payer: Medicare Other | Admitting: Hematology

## 2019-11-30 ENCOUNTER — Other Ambulatory Visit (HOSPITAL_COMMUNITY): Payer: Self-pay | Admitting: Hematology

## 2019-11-30 VITALS — BP 131/73 | HR 70 | Temp 98.2°F | Resp 16 | Wt 99.2 lb

## 2019-11-30 DIAGNOSIS — C50912 Malignant neoplasm of unspecified site of left female breast: Secondary | ICD-10-CM

## 2019-11-30 DIAGNOSIS — Z7189 Other specified counseling: Secondary | ICD-10-CM | POA: Diagnosis not present

## 2019-11-30 DIAGNOSIS — C3491 Malignant neoplasm of unspecified part of right bronchus or lung: Secondary | ICD-10-CM

## 2019-11-30 DIAGNOSIS — Z5111 Encounter for antineoplastic chemotherapy: Secondary | ICD-10-CM | POA: Diagnosis not present

## 2019-11-30 MED ORDER — OXYCODONE-ACETAMINOPHEN 5-325 MG PO TABS: 1.0000 | ORAL_TABLET | Freq: Three times a day (TID) | ORAL | 0 refills | Status: DC | PRN

## 2019-11-30 NOTE — Patient Instructions (Signed)
[place] at [facility] Discharge Instructions  You were seen today by Dr. Delton Coombes. He went over your recent results and scans, which showed worsening of lesions. The chemotherapy will consist of lurbinectedin once every 3 weeks. Dr. Delton Coombes will see you back in 2 weeks for labs and follow up.   Thank you for choosing [place] at [facility] to provide your oncology and hematology care.  To afford each patient quality time with our provider, please arrive at least 15 minutes before your scheduled appointment time.   If you have a lab appointment with the [facility] please come in thru the Main Entrance and check in at the main information desk  You need to re-schedule your appointment should you arrive 10 or more minutes late.  We strive to give you quality time with our providers, and arriving late affects you and other patients whose appointments are after yours.  Also, if you no show three or more times for appointments you may be dismissed from the clinic at the providers discretion.     Again, thank you for choosing [facility].  Our hope is that these requests will decrease the amount of time that you wait before being seen by our physicians.       _____________________________________________________________  Should you have questions after your visit to [facility], please contact our office at [phone] between the hours of 8:00 a.m. and 4:30 p.m.  Voicemails left after 4:00 p.m. will not be returned until the following business day.  For prescription refill requests, have your pharmacy contact our office and allow 72 hours.    Cancer Center Support Programs:   > Cancer Support Group  2nd Tuesday of the month 1pm-2pm, Journey Room

## 2019-11-30 NOTE — Progress Notes (Signed)
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     A cycle is every 21 days:     Etoposide      Carboplatin   **Always confirm dose/schedule in your pharmacy ordering system**  REASON: Other Reason PRIOR TREATMENT: [id number]: Carboplatin AUC=5 D1 + Etoposide 100 mg/m2 IV D1-3 q21 Days x 4 Cycles TREATMENT RESPONSE: N/A - Adjuvant Therapy  START ON PATHWAY REGIMEN - Small Cell Lung     A cycle is every 21 days:     Lurbinectedin   **Always confirm dose/schedule in your pharmacy ordering system**  Patient Characteristics: Relapsed or Progressive Disease, Second Line, Relapse 3 - 6 Months Therapeutic Status: Relapsed or Progressive Disease Line of Therapy: Second Line Time to Relapse: Relapse 3 - 6 Months Intent of Therapy: Non-Curative / Palliative Intent, Discussed with Patient

## 2019-11-30 NOTE — Progress Notes (Signed)
Ashley [name], [place] [address]   CLINIC:  Medical Oncology/Hematology  PCP:  [name], NP [address] / [place] [address] [phone]   REASON FOR VISIT:  Follow-up for right small cell lung cancer  PRIOR THERAPY:  1. Right upper lobectomy on 12/30/2018. 2. 30 Gy of radiation in 10 fractions from 01/28/2019 through 02/10/2019. 3. Carboplatin and etoposide x 4 cycles from 03/09/2019 through 05/11/2019.  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 The 3 liver lesions are not hypermetabolic. The radiologist suspects they may be subtly present on prior CT chest from 10/2013, to further reassuring that these are likely benign lesions.   Melanoma ([facility])  07/09/2013 Initial Biopsy   Initial biopsy L upper thigh/buttocks melanoma   07/20/2013 Surgery   Excision L lateral buttock/upper thigh melanoma, clear margins   07/20/2013 Pathology Results   Breslow depth 1.02 mm, pT2a    Small cell lung carcinoma, right ([facility])  11/27/2018 Initial Diagnosis   Small cell lung carcinoma, right ([facility])   03/09/2019 -  Chemotherapy   The patient had palonosetron (ALOXI) injection 0.25 mg, 0.25 mg, Intravenous,  Once, 4 of 4 cycles Administration: 0.25 mg (03/09/2019), 0.25 mg (03/30/2019), 0.25 mg (04/20/2019), 0.25 mg (05/11/2019) pegfilgrastim (NEULASTA ONPRO KIT) injection 6 mg, 6 mg, Subcutaneous, Once, 1 of 1  cycle Administration: 6 mg (03/11/2019) pegfilgrastim-jmdb (FULPHILA) injection 6 mg, 6 mg, Subcutaneous,  Once, 3 of 3 cycles Administration: 6 mg (04/03/2019), 6 mg (04/24/2019), 6 mg (05/15/2019) CARBOplatin (PARAPLATIN) 330 mg in sodium chloride 0.9 % 250 mL chemo infusion, 330 mg (100 % of original dose 325.5 mg), Intravenous,  Once, 4 of 4 cycles Dose modification:   (original dose 325.5 mg, Cycle 1),   (original dose 325.5 mg, Cycle 2),   (original dose 309 mg, Cycle 3),   (original dose 325.5 mg, Cycle 4) Administration: 330 mg (03/09/2019), 330 mg (03/30/2019), 310 mg (04/20/2019), 330 mg (05/11/2019) etoposide (VEPESID) 150 mg in sodium chloride 0.9 % 500 mL chemo infusion, 100 mg/m2 = 150 mg, Intravenous,  Once, 4 of 4 cycles Administration: 150 mg (03/09/2019), 150 mg (03/10/2019), 150 mg (03/11/2019), 150 mg (03/30/2019), 150 mg (03/31/2019), 150 mg (04/01/2019), 150 mg (04/20/2019), 150 mg (04/21/2019), 150 mg (04/22/2019), 150 mg (05/11/2019), 150 mg (05/12/2019), 150 mg (05/13/2019) fosaprepitant (EMEND) 150 mg, dexamethasone (DECADRON) 12 mg in sodium chloride 0.9 % 145 mL IVPB, , Intravenous,  Once, 4 of 4 cycles Administration:  (03/09/2019),  (03/30/2019),  (04/20/2019),  (05/11/2019)  for chemotherapy treatment.    03/09/2019 Cancer Staging   Staging form: Lung, AJCC 8th Edition - Clinical: Stage IIIA (cT3, cN1, cM0) - Signed by Derek Jack, MD on 03/09/2019     CANCER STAGING: Cancer Staging Small cell lung carcinoma, right ([facility]) Staging form: Lung, AJCC 8th Edition - Clinical: Stage IIIA (cT3, cN1, cM0) - Signed by Derek Jack, MD  on 03/09/2019   INTERVAL HISTORY:  [name], a 69 y.o. female, returns for routine follow-up of her right small cell lung cancer. Ashley [name] was last seen on 11/17/2019.  Today she is accompanied by her daughter and reports that she is doing okay. Her appetite is still severely decreased. She is open to continuing more chemo treatments;  she reports having nausea and bone pains for days after the Neulasta and switched to [facility] which was tolerated better. She complains of having nausea and headaches since her radiation therapy.   REVIEW OF SYSTEMS:  Review of Systems  Constitutional: Positive for appetite change (severely decreased) and fatigue (depleted).  HENT:   Positive for trouble swallowing.   Respiratory: Positive for shortness of breath.   Cardiovascular: Positive for chest pain.  Gastrointestinal: Positive for nausea.  Musculoskeletal: Positive for back pain (9/10 back pain).  Neurological: Positive for headaches.  Psychiatric/Behavioral: Positive for sleep disturbance.  All other systems reviewed and are negative.   PAST MEDICAL/SURGICAL HISTORY:  Past Medical History:  Diagnosis Date  . 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 Anginal pain ([facility])   . Arthritis   . Ascending aortic aneurysm ([facility])   . Cancer [facility]) 1995   breast, left, mastectomy/chemo  . Chest pain    Possibly cardiac. No evidence of ischemia/injury based upon normal troponin I. Chest discomfort could be tachycardia induced supply demand mismatch.   . CHF (congestive heart failure) ([facility]) 11/17/2015   after surgery   . Colon adenomas   . Coronary artery disease   . DJD (degenerative joint disease)   . Dyspnea    with exertion  . Emphysema of lung ([facility]) 11/17/2015  . Essential hypertension, benign   . GERD (gastroesophageal reflux disease)   . History of hiatal hernia   . Hyperlipidemia   . Hypertension   . Melanoma ([facility]) 01/09/2016  . Osteopenia   . Palpitations   . Pernicious anemia 03/06/2016  . Pernicious anemia   . Pure hypercholesterolemia   . Raynaud's disease   . Thrombocythemia, essential ([facility]) 01/09/2016  . Thrombocytosis ([facility])    Idiopathic  . Vitamin D deficiency    Past Surgical History:  Procedure Laterality Date  . ABDOMINAL HYSTERECTOMY    . ANTERIOR AND POSTERIOR REPAIR N/A 12/09/2014   Procedure:  ANTERIOR (CYSTOCELE) AND POSTERIOR REPAIR (RECTOCELE);  Surgeon: Bjorn Loser, MD;  Location: [facility];  Service: Urology;  Laterality: N/A;  . ANTERIOR CERVICAL DECOMPRESSION/DISCECTOMY FUSION 4 LEVELS Right 10/03/2016   Procedure: ANTERIOR CERVICAL DECOMPRESSION FUSION, CERVICAL 4-5, CERVICAL 5-6, CERVICAL 6-7, CERVICAL 7 TO THORACIC 1 WITH INSTRUMENTATION AND ALLOGRAFT;  Surgeon: Phylliss Bob, MD;  Location: [facility];  Service: Orthopedics;  Laterality: Right;  ANTERIOR CERVICAL DECOMPRESSION FUSION, CERVICAL 4-5, CERVICAL 5-6, CERVICAL 6-7, CERVICAL 7 TO THORACIC 1 WITH INSTRUMENTATION AND ALLOGRAFT; REQUEST 4 HO  . ANTERIOR LAT LUMBAR FUSION Left 11/16/2015   Procedure: LEFT SIDED LATERAL INTERBODY FUSION, LUMBAR 2-3, LUMBAR 3-4, LUMBAR 4-5 WITH INSTRUMENTATION;  Surgeon: Phylliss Bob, MD;  Location: [facility];  Service: Orthopedics;  Laterality: Left;  LEFT SIDED LATEARL INTERBODY FUSION, LUMBAR 2-3, LUMBAR 3-4, LUMBAR 4-5 WITH INSTRUMENTATION   . APPENDECTOMY    . BACK SURGERY    . BONE MARROW ASPIRATION  07/2012  . BONE MARROW BIOPSY  07/2012  . BREAST SURGERY    . CARDIAC CATHETERIZATION    . CARDIAC CATHETERIZATION N/A 01/20/2016   Procedure: Left Heart Cath and Coronary Angiography;  Surgeon: Burnell Blanks,  MD;  Location: [facility];  Service: Cardiovascular;  Laterality: N/A;  . COLONOSCOPY  11/29/2010   Procedure: COLONOSCOPY;  Surgeon: Rogene Houston, MD;  [facility];  Service: Endoscopy;  Laterality: N/A;  . COLONOSCOPY N/A 02/18/2014   Procedure: COLONOSCOPY;  Surgeon: Rogene Houston, MD;  [facility];  Service: Endoscopy;  Laterality: N/A;  1030  . COLONOSCOPY N/A 02/25/2017   Procedure: COLONOSCOPY;  Surgeon: Rogene Houston, MD;  [facility];  Service: Endoscopy;  Laterality: N/A;  10:55  . CYSTOSCOPY N/A 12/09/2014   Procedure: CYSTOSCOPY;  Surgeon: Bjorn Loser, MD;  Location: [facility];  Service: Urology;  Laterality: N/A;  .  ESOPHAGEAL DILATION N/A 02/25/2017   Procedure: ESOPHAGEAL DILATION;  Surgeon: Rogene Houston, MD;  [facility];  Service: Endoscopy;  Laterality: N/A;  . ESOPHAGOGASTRODUODENOSCOPY N/A 02/25/2017   Procedure: ESOPHAGOGASTRODUODENOSCOPY (EGD);  Surgeon: Rogene Houston, MD;  [facility];  Service: Endoscopy;  Laterality: N/A;  . IR GENERIC HISTORICAL  01/11/2016   IR RADIOLOGY PERIPHERAL GUIDED IV START 01/11/2016 Saverio Danker, PA-C [facility]  . IR GENERIC HISTORICAL  01/11/2016   IR US GUIDE VASC ACCESS RIGHT 01/11/2016 Saverio Danker, PA-C [facility]  . LYMPH NODE DISSECTION Right 12/30/2018   Procedure: Lymph Node Dissection;  Surgeon: Lajuana Matte, MD;  Location: [facility];  Service: Thoracic;  Laterality: Right;  . MASTECTOMY     left  . OVARIAN CYST SURGERY     x2  . POLYPECTOMY  02/25/2017   Procedure: POLYPECTOMY;  Surgeon: Rogene Houston, MD;  [facility];  Service: Endoscopy;;  . PORTACATH PLACEMENT Left 02/16/2019   Procedure: INSERTION PORT-A-CATH (CATHETER  LEFT SUBCLAVIAN);  Surgeon: Aviva Signs, MD;  Location: [facility];  Service: General;  Laterality: Left;  . SALPINGOOPHORECTOMY Bilateral 12/09/2014   Procedure: SALPINGO OOPHORECTOMY;  Surgeon: Servando Salina, MD;  Location: [facility];  Service: Gynecology;  Laterality: Bilateral;  . TUBAL LIGATION    . VAGINAL HYSTERECTOMY N/A 12/09/2014   Procedure: HYSTERECTOMY VAGINAL;  Surgeon: Servando Salina, MD;  Location: [facility];  Service: Gynecology;  Laterality: N/A;  . VIDEO ASSISTED THORACOSCOPY (VATS)/ LOBECTOMY Right 12/30/2018   Procedure: VIDEO ASSISTED THORACOSCOPY (VATS)/RIGHT LOWER LOBE WEDGE RESECTION, RIGHT UPPER LOBECTOMY;  Surgeon: Lajuana Matte, MD;  Location: [facility];  Service: Thoracic;  Laterality: Right;  Marland Kitchen VIDEO BRONCHOSCOPY N/A 12/30/2018   Procedure: VIDEO BRONCHOSCOPY;  Surgeon: Lajuana Matte, MD;  Location: [facility];  Service: Thoracic;  Laterality: N/A;     SOCIAL HISTORY:  Social History   Socioeconomic History  . Marital status: Divorced    Spouse name: Not on file  . Number of children: 2  . Years of education: Not on file  . Highest education level: Not on file  Occupational History  . Not on file  Tobacco Use  . Smoking status: Former Smoker    Packs/day: 0.50    Years: 50.00    Pack years: 25.00    Types: Cigarettes    Quit date: 11/25/2018    Years since quitting: 1.0  . Smokeless tobacco: Never Used  Vaping Use  . Vaping Use: Never used  Substance and Sexual Activity  . Alcohol use: Yes    Comment: occasional  . Drug use: No  . Sexual activity: Yes    Birth control/protection: Post-menopausal  Other Topics Concern  . Not on file  Social History Narrative  .  Not on file   Social Determinants of Health   Financial Resource Strain:   . Difficulty of Paying Living Expenses:   Food Insecurity:   . Worried About Charity fundraiser in the Last Year:   . Arboriculturist in the Last Year:   Transportation Needs:   . Film/video editor (Medical):   Marland Kitchen Lack of Transportation (Non-Medical):   Physical Activity:   . Days of Exercise per Week:   . Minutes of Exercise per Session:   Stress:   . Feeling of Stress :   Social Connections:   . Frequency of Communication with Friends and Family:   . Frequency of Social Gatherings with Friends and Family:   . Attends Religious Services:   . Active Member of Clubs or Organizations:   . Attends Archivist Meetings:   Marland Kitchen Marital Status:   Intimate Partner Violence:   . Fear of Current or Ex-Partner:   . Emotionally Abused:   Marland Kitchen Physically Abused:   . Sexually Abused:     FAMILY HISTORY:  Family History  Problem Relation Age of Onset  . Hypertension Mother   . Heart failure Mother   . Congestive Heart Failure Mother   . COPD Mother   . Pernicious anemia Mother   . Cancer Mother        lung  . Hypertension Father   . CAD Father   . Heart attack  Father   . Hypertension Sister   . Cancer Other   . Celiac disease Other     CURRENT MEDICATIONS:  Current Outpatient Medications  Medication Sig Dispense Refill  . albuterol (PROVENTIL HFA;VENTOLIN HFA) 108 (90 Base) MCG/ACT inhaler Inhale 2 puffs into the lungs every 6 (six) hours as needed for wheezing or shortness of breath. 1 Inhaler 2  . aspirin EC 81 MG tablet Take 81 mg by mouth daily.    . Calcium Carb-Cholecalciferol (CALCIUM 600+D) 600-800 MG-UNIT TABS Take 1 tablet by mouth 2 (two) times daily.    . Cholecalciferol (VITAMIN D-3) 1000 units CAPS Take 1,000 Units daily by mouth.     . clonazePAM (KLONOPIN) 0.5 MG tablet TAKE ONE TABLET BY MOUTH AT BEDTIME. 90 tablet 0  . cyanocobalamin (,VITAMIN B-12,) 1000 MCG/ML injection INJECT 1 ML INTO THE MUSCLE ONCE MONTHLY AS DIRECTED. (Patient taking differently: Inject 1,000 mcg into the muscle every 30 (thirty) days. ) 1 mL 5  . dexamethasone (DECADRON) 2 MG tablet Take 1 tablet (2 mg total) by mouth daily. 30 tablet 3  . dronabinol (MARINOL) 2.5 MG capsule Take 1 capsule (2.5 mg total) by mouth 2 (two) times daily before a meal. 60 capsule 3  . fluconazole (DIFLUCAN) 150 MG tablet Take 150 mg by mouth daily.    Marland Kitchen gabapentin (NEURONTIN) 600 MG tablet TAKE ONE TABLET BY MOUTH ONCE DAILY. 30 tablet 0  . gabapentin (NEURONTIN) 600 MG tablet Take 1 tablet (600 mg total) by mouth daily. 30 tablet 6  . guaiFENesin (MUCINEX) 600 MG 12 hr tablet Take 1 tablet (600 mg total) by mouth 2 (two) times daily as needed for cough or to loosen phlegm.    . ibandronate (BONIVA) 150 MG tablet Take 150 mg by mouth every 30 (thirty) days.     Marland Kitchen ibuprofen (ADVIL,MOTRIN) 800 MG tablet Take 800 mg by mouth every 8 (eight) hours as needed for moderate pain.     Marland Kitchen ipratropium-albuterol (DUONEB) 0.5-2.5 (3) MG/3ML SOLN Take 3 mLs by nebulization  every 6 (six) hours as needed (shortness of breath). 360 mL 0  . methocarbamol (ROBAXIN) 500 MG tablet Take 500 mg by  mouth every 6 (six) hours as needed for muscle spasms.     . nitroGLYCERIN (NITRODUR - DOSED IN MG/24 HR) 0.1 mg/hr patch Place 0.1 mg onto the skin daily.     Marland Kitchen oxyCODONE-acetaminophen (PERCOCET/ROXICET) 5-325 MG tablet Take 1 tablet by mouth every 8 (eight) hours as needed for severe pain. 56 tablet 0  . OXYGEN Inhale 3 L into the lungs daily.    . pantoprazole (PROTONIX) 40 MG tablet TAKE ONE TABLET BY MOUTH TWICE DAILY. 60 tablet 0  . polyethylene glycol (MIRALAX / GLYCOLAX) 17 g packet Take 17 g by mouth daily as needed for mild constipation.    . potassium chloride SA (KLOR-CON) 20 MEQ tablet Take 1 tablet (20 mEq total) by mouth 3 (three) times daily. 60 tablet 2  . rizatriptan (MAXALT) 5 MG tablet SMARTSIG:1-2 Tablet(s) By Mouth 1 to 2 Times Daily PRN    . rosuvastatin (CRESTOR) 40 MG tablet Take 40 mg at bedtime by mouth.     . traMADol (ULTRAM) 50 MG tablet Take 50 mg by mouth every 6 (six) hours as needed.    Marland Kitchen ipratropium (ATROVENT) 0.02 % nebulizer solution Take by nebulization.    . lidocaine-prilocaine (EMLA) cream Apply a small amount to port a cath site and cover with plastic wrap 1 hour prior to chemotherapy appointments (Patient not taking: Reported on 11/30/2019) 30 g 3  . ondansetron (ZOFRAN) 8 MG tablet Take 1 tablet (8 mg total) by mouth every 8 (eight) hours as needed for nausea or vomiting. (Patient not taking: Reported on 11/30/2019) 20 tablet 5  . ondansetron (ZOFRAN-ODT) 8 MG disintegrating tablet TAKE (1) TABLET BY MOUTH EVERY EIGHT HOURS AS NEEDED. (Patient not taking: Reported on 11/30/2019) 30 tablet 6  . prochlorperazine (COMPAZINE) 10 MG tablet Take 1 tablet (10 mg total) by mouth every 6 (six) hours as needed (Nausea or vomiting). (Patient not taking: Reported on 11/30/2019) 30 tablet 1  . rizatriptan (MAXALT) 10 MG tablet SMARTSIG:1 Tablet(s) By Mouth 1 to 2 Times Daily     No current facility-administered medications for this visit.    ALLERGIES:  Allergies    Allergen Reactions  . Penicillins Hives and Rash    Did it involve swelling of the face/tongue/throat, SOB, or low BP? No Did it involve sudden or severe rash/hives, skin peeling, or any reaction on the inside of your mouth or nose? No Did you need to seek medical attention at a hospital or doctor's office? No When did it last happen?5-10 year If all above answers are "NO", may proceed with cephalosporin use.    . Tape Rash    Medipore, Coban, and paper tape CAN be tolerated    PHYSICAL EXAM:  Performance status (ECOG): 1 - Symptomatic but completely ambulatory  Vitals:   11/30/19 1521  BP: 131/73  Pulse: 70  Resp: 16  Temp: 98.2 F (36.8 C)  SpO2: 98%   Wt Readings from Last 3 Encounters:  11/30/19 99 lb 3.2 oz (45 kg)  11/17/19 98 lb 5.2 oz (44.6 kg)  09/17/19 98 lb 3.2 oz (44.5 kg)   Physical Exam Vitals reviewed.  Constitutional:      Appearance: Normal appearance.  Cardiovascular:     Rate and Rhythm: Normal rate and regular rhythm.     Pulses: Normal pulses.     Heart sounds: Normal heart  sounds.  Pulmonary:     Effort: Pulmonary effort is normal.     Breath sounds: Normal breath sounds.  Neurological:     General: No focal deficit present.     Mental Status: She is alert and oriented to person, place, and time.  Psychiatric:        Mood and Affect: Mood normal.        Behavior: Behavior normal.      LABORATORY DATA:  I have reviewed the labs as listed.  CBC Latest Ref Rng & Units 11/17/2019 10/20/2019 09/17/2019  WBC 4.0 - 10.5 K/uL 6.6 6.8 8.2  Hemoglobin 12.0 - 15.0 g/dL 11.4(L) 10.8(L) 12.3  Hematocrit 36 - 46 % 35.8(L) 33.9(L) 38.7  Platelets 150 - 400 K/uL 384 387 473(H)   CMP Latest Ref Rng & Units 11/17/2019 10/20/2019 09/17/2019  Glucose 70 - 99 mg/dL 156(H) 172(H) 134(H)  BUN 8 - 23 mg/dL '14 14 13  ' Creatinine 0.44 - 1.00 mg/dL 0.89 0.95 0.87  Sodium 135 - 145 mmol/L 136 132(L) 136  Potassium 3.5 - 5.1 mmol/L 3.4(L) 3.3(L) 3.1(L)  Chloride 98  - 111 mmol/L 104 100 100  CO2 22 - 32 mmol/L '24 23 26  ' Calcium 8.9 - 10.3 mg/dL 8.9 8.5(L) 8.8(L)  Total Protein 6.5 - 8.1 g/dL 6.8 6.4(L) 6.8  Total Bilirubin 0.3 - 1.2 mg/dL 0.6 0.4 0.6  Alkaline Phos 38 - 126 U/L 59 66 73  AST 15 - 41 U/L '19 22 18  ' ALT 0 - 44 U/L '13 14 11   ' Lab Results  Component Value Date   TIBC 281 11/17/2019   TIBC 268 06/29/2019   TIBC 279 03/02/2019   FERRITIN 139 11/17/2019   FERRITIN 195 06/29/2019   FERRITIN 107 03/02/2019   IRONPCTSAT 26 11/17/2019   IRONPCTSAT 15 06/29/2019   IRONPCTSAT 24 03/02/2019    DIAGNOSTIC IMAGING:  I have independently reviewed the scans and discussed with the patient. NM PET Image Restag (PS) Skull Base To Thigh  Result Date: 11/25/2019 CLINICAL DATA:  Subsequent treatment strategy for lung cancer. EXAM: NUCLEAR MEDICINE PET SKULL BASE TO THIGH TECHNIQUE: 5.13 mCi F-18 FDG was injected intravenously. Full-ring PET imaging was performed from the skull base to thigh after the radiotracer. CT data was obtained and used for attenuation correction and anatomic localization. Fasting blood glucose: 87 mg/dl COMPARISON:  09/28/2019 FINDINGS: Mediastinal blood pool activity: SUV max 2.17 Liver activity: SUV max NA NECK: No hypermetabolic lymph nodes in the neck. Incidental CT findings: none CHEST: There is progressive increased soft tissue surrounding the right anterior lung suture line. This measures 3.2 x 1.6 cm and has an SUV max of 9.9, image 72/4. Separate area of progressive soft tissue within the right hilum measures 2.7 x 1.2 cm with SUV max of 10.3. FDG avid low right paratracheal lymph node measures 1.6 cm and has an SUV max of 10.1. Incidental CT findings: Post treatment changes with right lung volume loss and pleuroparenchymal scarring is again identified within the right hemithorax. Only mild FDG uptake is associated with these areas. Small left lung nodules are noted which are below resolution of PET-CT including 0.5 x 0.9 cm  perifissural lymph node, image 38/8. Moderate centrilobular emphysema with diffuse bronchial wall thickening. Aortic atherosclerosis. Coronary artery atherosclerotic calcifications. ABDOMEN/PELVIS: There is a small subcapsular focus of increased radiotracer uptake overlying the anterolateral right hepatic lobe within SUV max of 5.2 on the corresponding CT images there is a small focus of low attenuation  within the anterolateral subcapsular region of the right hepatic lobe measuring 1.6 x 0.7 cm, image 105/4. There is no abnormal radiotracer uptake within the pancreas, spleen, or adrenal glands. Incidental CT findings: Aortic atherosclerosis. No aneurysm. Unchanged low-attenuation right adrenal nodule compatible with a benign adenoma, image 104/4. Bilateral kidney cysts are noted which are incompletely characterized without IV contrast. SKELETON: No focal hypermetabolic activity to suggest skeletal metastasis. Incidental CT findings: none IMPRESSION: 1. Progressive increased soft tissue within the right lung along suture margins concerning for recurrence of disease. 2. FDG avid low right paratracheal lymph node concerning for nodal metastasis. 3. Small subcapsular focus of increased radiotracer uptake overlying the anterolateral right hepatic lobe, also concerning for tumor. 4. Stable right adrenal gland adenoma. 5. Aortic Atherosclerosis (ICD10-I70.0) and Emphysema (ICD10-J43.9). 6. Coronary artery calcifications. Electronically Signed   By: Kerby Moors M.D.   On: 11/25/2019 09:08   MM 3D SCREEN BREAST UNI RIGHT  Result Date: 11/05/2019 CLINICAL DATA:  Screening. EXAM: DIGITAL SCREENING UNILATERAL RIGHT MAMMOGRAM WITH CAD AND TOMO COMPARISON:  Previous exam(s). ACR Breast Density Category c: The breast tissue is heterogeneously dense, which may obscure small masses. FINDINGS: There are no findings suspicious for malignancy. Images were processed with CAD. IMPRESSION: No mammographic evidence of malignancy. A  result letter of this screening mammogram will be mailed directly to the patient. RECOMMENDATION: Screening mammogram in one year. (Code:[id number]-[id number]) BI-RADS CATEGORY  1: Negative. Electronically Signed   By: Abelardo Diesel M.D.   On: 11/05/2019 14:04     ASSESSMENT:  1. 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 -30 Gy of radiation in 10 fractions from 01/28/2019 through 02/10/2019. -4 cycles of carboplatin and etoposide from 03/09/2019 through 05/11/2019. -PCI completed on 07/31/2019. -We reviewed MRI of the brain on 09/15/2019 with no evidence of brain meta stasis. Concern for hypointense appearance at the C3 vertebral body and left articular process. -CT chest on 09/15/2019 shows numerous new small solid irregular pulmonary nodules scattered throughout both lungs, largest 7 mm on the right lower lobe. Tiny loculated anterior right pleural effusion decreased. Stable mild subcarinal adenopathy. Bilateral stable adrenal adenomas. -MRI of the C-spine on 10/13/2019 showed marrow edema and enhancement involving C3 vertebral body and left articular process without discrete bone lesion. This was thought to be degenerative. -PET scan on 09/28/2019 shows bilateral lung nodules, below PET resolution. Small right-sided nodular densities demonstrating low-level hypermetabolism. Mild hypermetabolic corresponding to a normal-sized subcarinal lymph node. Multifocal right pleural hypermetabolism in the setting of pleural thickening and prior right upper lobectomy, indeterminate. No hypermetabolic extrathoracic disease. -PET scan on 11/24/2019 shows progressive increased soft tissue within the right lung along the suture margins, positive right paratracheal lymph node, small  subcapsular focus of increased radiotracer uptake overlying the anterolateral right hepatic lobe concerning for tumor.   PLAN:  1. 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 -Unfortunately her tumor has recurred.  This was evident on prior scan but it was not conclusive.  She had progression within 6 months upon completion of chemotherapy.  Hence have recommended against using the platinum regimen. -I have recommended Lurbinectedin every 3 weeks until progression.  We talked about side effects in detail.  We have  given literature. -She is going to the beach for a week this Friday.  She will come back in 2 weeks to initiate therapy. -We will also refill her pain medication.  2. Essential thrombocytosis: -Hydroxyurea on hold since chemotherapy for lung cancer.  Last platelet count was 384.  3. Macrocytic anemia: -CBC from 11/17/2019 shows hemoglobin 11.4.  Macrocytosis resolved with MCV 90.9.  4.  Weight loss: -Her weight is stable around 99 pounds in the last couple of months.   Orders placed this encounter:  No orders of the defined types were placed in this encounter.   Derek Jack, MD [place] [phone]   I, Milinda Antis, am acting as a scribe for Dr. Sanda Linger.  I, Derek Jack MD, have reviewed the above documentation for accuracy and completeness, and I agree with the above.

## 2019-12-02 ENCOUNTER — Ambulatory Visit (HOSPITAL_COMMUNITY): Payer: Medicare Other | Admitting: Hematology

## 2019-12-14 ENCOUNTER — Other Ambulatory Visit (HOSPITAL_COMMUNITY): Payer: Self-pay

## 2019-12-14 DIAGNOSIS — C3491 Malignant neoplasm of unspecified part of right bronchus or lung: Secondary | ICD-10-CM

## 2019-12-14 MED ORDER — ONDANSETRON HCL 8 MG PO TABS: 8.0000 mg | ORAL_TABLET | Freq: Three times a day (TID) | ORAL | 1 refills | Status: DC | PRN

## 2019-12-14 NOTE — Progress Notes (Signed)
.   Pharmacist Chemotherapy Monitoring - Initial Assessment    Anticipated start date: 12/15/19   Regimen:  . Are orders appropriate based on the patient's diagnosis, regimen, and cycle? Yes . Does the plan date match the patient's scheduled date? Yes . Is the sequencing of drugs appropriate? Yes . Are the premedications appropriate for the patient's regimen? Yes . Prior Authorization for treatment is: Approved o If applicable, is the correct biosimilar selected based on the patient's insurance? not applicable  Organ Function and Labs: Marland Kitchen Are dose adjustments needed based on the patient's renal function, hepatic function, or hematologic function? Yes . Are appropriate labs ordered prior to the start of patient's treatment? Yes . Other organ system assessment, if indicated: N/A . The following baseline labs, if indicated, have been ordered: N/A  Dose Assessment: . Are the drug doses appropriate? Yes . Are the following correct: o Drug concentrations Yes o IV fluid compatible with drug Yes o Administration routes Yes o Timing of therapy Yes . If applicable, does the patient have documented access for treatment and/or plans for port-a-cath placement? yes . If applicable, have lifetime cumulative doses been properly documented and assessed? yes Lifetime Dose Tracking  . Carboplatin: 1,300 mg = 0.01 % of the maximum lifetime dose of 999,999,999 mg  o   Toxicity Monitoring/Prevention: . The patient has the following take home antiemetics prescribed: Ondansetron . The patient has the following take home medications prescribed: N/A . Medication allergies and previous infusion related reactions, if applicable, have been reviewed and addressed. Yes . The patient's current medication list has been assessed for drug-drug interactions with their chemotherapy regimen. no significant drug-drug interactions were identified on review.  Order Review: . Are the treatment plan orders signed? No . Is  the patient scheduled to see a provider prior to their treatment? Yes  I verify that I have reviewed each item in the above checklist and answered each question accordingly.  Wynona Neat 12/14/2019 3:51 PM

## 2019-12-15 ENCOUNTER — Other Ambulatory Visit (HOSPITAL_COMMUNITY): Payer: Self-pay | Admitting: *Deleted

## 2019-12-15 ENCOUNTER — Inpatient Hospital Stay (HOSPITAL_COMMUNITY): Payer: Medicare Other

## 2019-12-15 ENCOUNTER — Encounter (HOSPITAL_COMMUNITY): Payer: Self-pay | Admitting: Hematology

## 2019-12-15 ENCOUNTER — Inpatient Hospital Stay (HOSPITAL_BASED_OUTPATIENT_CLINIC_OR_DEPARTMENT_OTHER): Payer: Medicare Other | Admitting: Hematology

## 2019-12-15 ENCOUNTER — Other Ambulatory Visit: Payer: Self-pay

## 2019-12-15 VITALS — BP 94/76 | HR 81 | Temp 97.0°F | Resp 17

## 2019-12-15 VITALS — BP 93/60 | HR 56 | Temp 96.9°F | Resp 16 | Wt 94.6 lb

## 2019-12-15 DIAGNOSIS — Z5111 Encounter for antineoplastic chemotherapy: Secondary | ICD-10-CM | POA: Diagnosis not present

## 2019-12-15 DIAGNOSIS — C3491 Malignant neoplasm of unspecified part of right bronchus or lung: Secondary | ICD-10-CM | POA: Diagnosis not present

## 2019-12-15 LAB — CBC WITH DIFFERENTIAL/PLATELET
Abs Immature Granulocytes: 0.13 10*3/uL — ABNORMAL HIGH (ref 0.00–0.07)
Basophils Absolute: 0.1 10*3/uL (ref 0.0–0.1)
Basophils Relative: 0 %
Eosinophils Absolute: 0.1 10*3/uL (ref 0.0–0.5)
Eosinophils Relative: 1 %
HCT: 32.3 % — ABNORMAL LOW (ref 36.0–46.0)
Hemoglobin: 10.3 g/dL — ABNORMAL LOW (ref 12.0–15.0)
Immature Granulocytes: 1 %
Lymphocytes Relative: 4 %
Lymphs Abs: 0.8 10*3/uL (ref 0.7–4.0)
MCH: 28.4 pg (ref 26.0–34.0)
MCHC: 31.9 g/dL (ref 30.0–36.0)
MCV: 89 fL (ref 80.0–100.0)
Monocytes Absolute: 1.4 10*3/uL — ABNORMAL HIGH (ref 0.1–1.0)
Monocytes Relative: 8 %
Neutro Abs: 16.3 10*3/uL — ABNORMAL HIGH (ref 1.7–7.7)
Neutrophils Relative %: 86 %
Platelets: 574 10*3/uL — ABNORMAL HIGH (ref 150–400)
RBC: 3.63 MIL/uL — ABNORMAL LOW (ref 3.87–5.11)
RDW: 15.4 % (ref 11.5–15.5)
WBC: 18.8 10*3/uL — ABNORMAL HIGH (ref 4.0–10.5)
nRBC: 0 % (ref 0.0–0.2)

## 2019-12-15 LAB — COMPREHENSIVE METABOLIC PANEL
ALT: 16 U/L (ref 0–44)
AST: 27 U/L (ref 15–41)
Albumin: 2.9 g/dL — ABNORMAL LOW (ref 3.5–5.0)
Alkaline Phosphatase: 80 U/L (ref 38–126)
Anion gap: 10 (ref 5–15)
BUN: 20 mg/dL (ref 8–23)
CO2: 21 mmol/L — ABNORMAL LOW (ref 22–32)
Calcium: 8.5 mg/dL — ABNORMAL LOW (ref 8.9–10.3)
Chloride: 100 mmol/L (ref 98–111)
Creatinine, Ser: 1.33 mg/dL — ABNORMAL HIGH (ref 0.44–1.00)
GFR calc Af Amer: 46 mL/min — ABNORMAL LOW (ref >60)
GFR calc non Af Amer: 40 mL/min — ABNORMAL LOW (ref >60)
Glucose, Bld: 191 mg/dL — ABNORMAL HIGH (ref 70–99)
Potassium: 3.2 mmol/L — ABNORMAL LOW (ref 3.5–5.1)
Sodium: 131 mmol/L — ABNORMAL LOW (ref 135–145)
Total Bilirubin: 0.7 mg/dL (ref 0.3–1.2)
Total Protein: 7 g/dL (ref 6.5–8.1)

## 2019-12-15 MED ORDER — SODIUM CHLORIDE 0.9% FLUSH
10.0000 mL | INTRAVENOUS | Status: DC | PRN
  Administered 2019-12-15: 10 mL

## 2019-12-15 MED ORDER — HEPARIN SOD (PORK) LOCK FLUSH 100 UNIT/ML IV SOLN
500.0000 [IU] | Freq: Once | INTRAVENOUS | Status: AC | PRN
  Administered 2019-12-15: 500 [IU]

## 2019-12-15 MED ORDER — SODIUM CHLORIDE 0.9 % IV SOLN: Freq: Once | INTRAVENOUS | Status: AC

## 2019-12-15 MED ORDER — SODIUM CHLORIDE 0.9 % IV SOLN: INTRAVENOUS | Status: AC

## 2019-12-15 MED ORDER — OXYCODONE HCL 5 MG PO TABS
5.0000 mg | ORAL_TABLET | Freq: Three times a day (TID) | ORAL | 0 refills | Status: DC
Start: 2019-12-15 — End: 2020-01-18

## 2019-12-15 MED ORDER — PALONOSETRON HCL INJECTION 0.25 MG/5ML
0.2500 mg | Freq: Once | INTRAVENOUS | Status: AC
  Administered 2019-12-15: 0.25 mg via INTRAVENOUS
  Filled 2019-12-15: qty 5

## 2019-12-15 MED ORDER — SODIUM CHLORIDE 0.9 % IV SOLN
10.0000 mg | Freq: Once | INTRAVENOUS | Status: AC
  Administered 2019-12-15: 10 mg via INTRAVENOUS
  Filled 2019-12-15: qty 10

## 2019-12-15 MED ORDER — SODIUM CHLORIDE 0.9 % IV SOLN
2.6000 mg/m2 | Freq: Once | INTRAVENOUS | Status: AC
  Administered 2019-12-15: 3.65 mg via INTRAVENOUS
  Filled 2019-12-15: qty 7.3

## 2019-12-15 MED ORDER — POTASSIUM CHLORIDE CRYS ER 20 MEQ PO TBCR
20.0000 meq | EXTENDED_RELEASE_TABLET | Freq: Once | ORAL | Status: AC
  Administered 2019-12-15: 20 meq via ORAL
  Filled 2019-12-15: qty 1

## 2019-12-15 NOTE — Patient Instructions (Addendum)
[place] at [facility] Discharge Instructions  You were seen today by Dr. Delton Coombes. He went over your recent results. You received your treatment today. You will be prescribed oxycodone for the pain control. Take 1/2 tablet of dexamethasone every morning. Take 1 tablet of potassium daily. Your next appointment will be in 1 week with the nurse practitioner for follow up. Dr. Delton Coombes will see you back in 3 weeks for labs and follow up.   Thank you for choosing [place] at [facility] to provide your oncology and hematology care.  To afford each patient quality time with our provider, please arrive at least 15 minutes before your scheduled appointment time.   If you have a lab appointment with the [facility] please come in thru the Main Entrance and check in at the main information desk  You need to re-schedule your appointment should you arrive 10 or more minutes late.  We strive to give you quality time with our providers, and arriving late affects you and other patients whose appointments are after yours.  Also, if you no show three or more times for appointments you may be dismissed from the clinic at the providers discretion.     Again, thank you for choosing [facility].  Our hope is that these requests will decrease the amount of time that you wait before being seen by our physicians.       _____________________________________________________________  Should you have questions after your visit to [facility], please contact our office at [phone] between the hours of 8:00 a.m. and 4:30 p.m.  Voicemails left after 4:00 p.m. will not be returned until the following business day.  For prescription refill requests, have your pharmacy contact our office and allow 72 hours.    Cancer Center Support Programs:   > Cancer Support Group  2nd Tuesday of the month 1pm-2pm, Journey Room

## 2019-12-15 NOTE — Progress Notes (Signed)
Patient presents today for treatment D1 C1 and follow up visit with Dr. Delton Coombes. Vital signs stable. Labs reviewed by Dr. Delton Coombes. Message received to proceed with treatment today. Infuse 1 Liter of Normal Saline over 2 hours prior to chemo treatment. Schedule patient to have an appointment with RLockamy NP next week, with labs and possible fluids. Creatinine 1.33 today. Give 20 mEq of Potassium PO x 1 dose. Potassium 3.2 today.   Treatment given today per MD orders. Tolerated infusion without adverse affects. Vital signs stable. No complaints at this time. Discharged from clinic via wheel chair. F/U with [facility] as scheduled.

## 2019-12-15 NOTE — Progress Notes (Signed)
Patient has been assessed by Dr. Delton Coombes and labs reviewed.  Her potassium is 3.2.  She is going to restart taking her potassium at home.  She will get one dose of potassium 20 mEq by mouth here today.  Her Creatinine is 1.33. Dr. Delton Coombes wants her to have 1 liter Normal saline over 2 hours.  She has lost weight, he will dose reduce her treatment today.  Okay to proceed. Primary RN and pharmacy aware.

## 2019-12-15 NOTE — Patient Instructions (Signed)
[facility] Discharge Instructions for Patients Receiving Chemotherapy  Today you received the following chemotherapy agents   To help prevent nausea and vomiting after your treatment, we encourage you to take your nausea medication   If you develop nausea and vomiting that is not controlled by your nausea medication, call the clinic.   BELOW ARE SYMPTOMS THAT SHOULD BE REPORTED IMMEDIATELY:  *FEVER GREATER THAN 100.5 F  *CHILLS WITH OR WITHOUT FEVER  NAUSEA AND VOMITING THAT IS NOT CONTROLLED WITH YOUR NAUSEA MEDICATION  *UNUSUAL SHORTNESS OF BREATH  *UNUSUAL BRUISING OR BLEEDING  TENDERNESS IN MOUTH AND THROAT WITH OR WITHOUT PRESENCE OF ULCERS  *URINARY PROBLEMS  *BOWEL PROBLEMS  UNUSUAL RASH Items with * indicate a potential emergency and should be followed up as soon as possible.  Feel free to call the clinic should you have any questions or concerns. The clinic phone number is [phone].  Please show the CHEMO ALERT CARD at check-in to the Emergency Department and triage nurse.   

## 2019-12-15 NOTE — Progress Notes (Signed)
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 Right upper lobectomy on 12/30/2018. 2. 30 Gy of radiation in 10 fractions from 01/28/2019 through 02/10/2019. 3. Carboplatin and etoposide x 4 cycles from 03/09/2019 through 05/11/2019.  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 The 3 liver lesions are not hypermetabolic. The radiologist suspects they may be subtly present on prior CT chest from 10/2013, to further reassuring that these are likely benign lesions.   Melanoma ([place])  07/09/2013 Initial Biopsy   Initial biopsy L upper thigh/buttocks melanoma   07/20/2013 Surgery   Excision L lateral buttock/upper thigh melanoma, clear margins   07/20/2013 Pathology Results   Breslow depth 1.02 mm, pT2a    Small cell lung carcinoma, right ([facility])  11/27/2018 Initial Diagnosis   Small cell lung carcinoma, right ([facility] [facility])   03/09/2019 - 05/15/2019 Chemotherapy   The patient had palonosetron (ALOXI) injection 0.25 mg, 0.25 mg, Intravenous,  Once, 4 of 4 cycles Administration: 0.25 mg (03/09/2019), 0.25 mg (03/30/2019), 0.25 mg (04/20/2019), 0.25 mg (05/11/2019) pegfilgrastim (NEULASTA ONPRO KIT) injection 6 mg, 6  mg, Subcutaneous, Once, 1 of 1 cycle Administration: 6 mg (03/11/2019) pegfilgrastim-jmdb (FULPHILA) injection 6 mg, 6 mg, Subcutaneous,  Once, 3 of 3 cycles Administration: 6 mg (04/03/2019), 6 mg (04/24/2019), 6 mg (05/15/2019) CARBOplatin (PARAPLATIN) 330 mg in sodium chloride 0.9 % 250 mL chemo infusion, 330 mg (100 % of original dose 325.5 mg), Intravenous,  Once, 4 of 4 cycles Dose modification:   (original dose 325.5 mg, Cycle 1),   (original dose 325.5 mg, Cycle 2),   (original dose 309 mg, Cycle 3),   (original dose 325.5 mg, Cycle 4) Administration: 330 mg (03/09/2019), 330 mg (03/30/2019), 310 mg (04/20/2019), 330 mg (05/11/2019) etoposide (VEPESID) 150 mg in sodium chloride 0.9 % 500 mL chemo infusion, 100 mg/m2 = 150 mg, Intravenous,  Once, 4 of 4 cycles Administration: 150 mg (03/09/2019), 150 mg (03/10/2019), 150 mg (03/11/2019), 150 mg (03/30/2019), 150 mg (03/31/2019), 150 mg (04/01/2019), 150 mg (04/20/2019), 150 mg (04/21/2019), 150 mg (04/22/2019), 150 mg (05/11/2019), 150 mg (05/12/2019), 150 mg (05/13/2019) fosaprepitant (EMEND) 150 mg, dexamethasone (DECADRON) 12 mg in sodium chloride 0.9 % 145 mL IVPB, , Intravenous,  Once, 4 of 4 cycles Administration:  (03/09/2019),  (03/30/2019),  (04/20/2019),  (05/11/2019)  for chemotherapy treatment.    03/09/2019 Cancer Staging   Staging form: Lung, AJCC 8th Edition - Clinical: Stage IIIA (cT3, cN1, cM0) - Signed by Derek Jack, MD on 03/09/2019   12/15/2019 -  Chemotherapy   The patient had palonosetron (ALOXI) injection 0.25 mg, 0.25 mg, Intravenous,  Once, 0 of 4 cycles lurbinectedin (ZEPZELCA) 4.5  mg in sodium chloride 0.9 % 250 mL chemo infusion, 3.2 mg/m2 = 4.5 mg, Intravenous,  Once, 0 of 4 cycles  for chemotherapy treatment.      CANCER STAGING: Cancer Staging Small cell lung carcinoma, right (HCC) Staging form: Lung, AJCC 8th Edition - Clinical: Stage IIIA (cT3, cN1, cM0) - Signed by Derek Jack, MD on  03/09/2019   INTERVAL HISTORY:  [name], a 69 y.o. female, returns for routine follow-up and consideration for first cycle of chemotherapy. Ashley [name] was last seen on 11/30/2019.  Due for initiating cycle #1 of lurbinectedin today.   Today she is accompanied by her daughter. She complains of having pain in her mid-chest and mid-thoracic back; the chest pain radiates into the neck and jaw, lasting about 10 minutes. The back pain starts randomly and will last for a long time until she takes pain meds. She is taking Percocet 2-3 tablets daily and tramadol BID. She does not drink Ensure, but eats 3 spoons of ice cream; she is not taking Marinol. She has lost 5 lbs. She is not taking Decadron currently.  Overall, she feels ready for first cycle of chemo today.    REVIEW OF SYSTEMS:  Review of Systems  Constitutional: Positive for appetite change (depleted) and fatigue (depleted).  Eyes: Positive for eye problems (blurred vision).  Respiratory: Positive for shortness of breath.   Cardiovascular: Positive for chest pain (middle of chest over sternum) and palpitations.  Gastrointestinal: Positive for constipation and nausea.  Musculoskeletal: Positive for back pain (mid-back pain).  Skin: Positive for itching (head).  Neurological: Positive for headaches and numbness (hands & feet).  All other systems reviewed and are negative.   PAST MEDICAL/SURGICAL HISTORY:  Past Medical History:  Diagnosis Date  . Adenocarcinoma of left breast ([facility]) 01/09/2016  . Anginal pain ([facility])   . Arthritis   . Ascending aortic aneurysm ([facility])   . Cancer [facility]) 1995   breast, left, mastectomy/chemo  . Chest pain    Possibly cardiac. No evidence of ischemia/injury based upon normal troponin I. Chest discomfort could be tachycardia induced supply demand mismatch.   . CHF (congestive heart failure) ([facility]) 11/17/2015   after surgery   . Colon adenomas   . Coronary artery disease   . DJD (degenerative joint  disease)   . Dyspnea    with exertion  . Emphysema of lung ([facility]) 11/17/2015  . Essential hypertension, benign   . GERD (gastroesophageal reflux disease)   . History of hiatal hernia   . Hyperlipidemia   . Hypertension   . Melanoma ([facility]) 01/09/2016  . Osteopenia   . Palpitations   . Pernicious anemia 03/06/2016  . Pernicious anemia   . Pure hypercholesterolemia   . Raynaud's disease   . Thrombocythemia, essential ([facility]) 01/09/2016  . Thrombocytosis (H[facility])    Idiopathic  . Vitamin D deficiency    Past Surgical History:  Procedure Laterality Date  . ABDOMINAL HYSTERECTOMY    . ANTERIOR AND POSTERIOR REPAIR N/A 12/09/2014   Procedure: ANTERIOR (CYSTOCELE) AND POSTERIOR REPAIR (RECTOCELE);  Surgeon: Bjorn Loser, MD;  Location: [facility];  Service: Urology;  Laterality: N/A;  . ANTERIOR CERVICAL DECOMPRESSION/DISCECTOMY FUSION 4 LEVELS Right 10/03/2016   Procedure: ANTERIOR CERVICAL DECOMPRESSION FUSION, CERVICAL 4-5, CERVICAL 5-6, CERVICAL 6-7, CERVICAL 7 TO THORACIC 1 WITH INSTRUMENTATION AND ALLOGRAFT;  Surgeon: Phylliss Bob, MD;  Location: [facility];  Service: Orthopedics;  Laterality: Right;  ANTERIOR CERVICAL DECOMPRESSION FUSION, CERVICAL 4-5, CERVICAL 5-6, CERVICAL 6-7, CERVICAL 7 TO  THORACIC 1 WITH INSTRUMENTATION AND ALLOGRAFT; REQUEST 4 HO  . ANTERIOR LAT LUMBAR FUSION Left 11/16/2015   Procedure: LEFT SIDED LATERAL INTERBODY FUSION, LUMBAR 2-3, LUMBAR 3-4, LUMBAR 4-5 WITH INSTRUMENTATION;  Surgeon: Phylliss Bob, MD;  Location: [facility];  Service: Orthopedics;  Laterality: Left;  LEFT SIDED LATEARL INTERBODY FUSION, LUMBAR 2-3, LUMBAR 3-4, LUMBAR 4-5 WITH INSTRUMENTATION   . APPENDECTOMY    . BACK SURGERY    . BONE MARROW ASPIRATION  07/2012  . BONE MARROW BIOPSY  07/2012  . BREAST SURGERY    . CARDIAC CATHETERIZATION    . CARDIAC CATHETERIZATION N/A 01/20/2016   Procedure: Left Heart Cath and Coronary Angiography;  Surgeon: Burnell Blanks, MD;  Location: [facility];   Service: Cardiovascular;  Laterality: N/A;  . COLONOSCOPY  11/29/2010   Procedure: COLONOSCOPY;  Surgeon: Rogene Houston, MD;  [facility];  Service: Endoscopy;  Laterality: N/A;  . COLONOSCOPY N/A 02/18/2014   Procedure: COLONOSCOPY;  Surgeon: Rogene Houston, MD;  [facility];  Service: Endoscopy;  Laterality: N/A;  1030  . COLONOSCOPY N/A 02/25/2017   Procedure: COLONOSCOPY;  Surgeon: Rogene Houston, MD;  [facility];  Service: Endoscopy;  Laterality: N/A;  10:55  . CYSTOSCOPY N/A 12/09/2014   Procedure: CYSTOSCOPY;  Surgeon: Bjorn Loser, MD;  Location: [facility];  Service: Urology;  Laterality: N/A;  . ESOPHAGEAL DILATION N/A 02/25/2017   Procedure: ESOPHAGEAL DILATION;  Surgeon: Rogene Houston, MD;  [facility];  Service: Endoscopy;  Laterality: N/A;  . ESOPHAGOGASTRODUODENOSCOPY N/A 02/25/2017   Procedure: ESOPHAGOGASTRODUODENOSCOPY (EGD);  Surgeon: Rogene Houston, MD;  [facility];  Service: Endoscopy;  Laterality: N/A;  . IR GENERIC HISTORICAL  01/11/2016   IR RADIOLOGY PERIPHERAL GUIDED IV START 01/11/2016 Saverio Danker, PA-C [facility]  . IR GENERIC HISTORICAL  01/11/2016   IR US GUIDE VASC ACCESS RIGHT 01/11/2016 Saverio Danker, PA-C [facility]  . LYMPH NODE DISSECTION Right 12/30/2018   Procedure: Lymph Node Dissection;  Surgeon: Lajuana Matte, MD;  Location: [facility];  Service: Thoracic;  Laterality: Right;  . MASTECTOMY     left  . OVARIAN CYST SURGERY     x2  . POLYPECTOMY  02/25/2017   Procedure: POLYPECTOMY;  Surgeon: Rogene Houston, MD;  [facility];  Service: Endoscopy;;  . PORTACATH PLACEMENT Left 02/16/2019   Procedure: INSERTION PORT-A-CATH (CATHETER  LEFT SUBCLAVIAN);  Surgeon: Aviva Signs, MD;  Location: [facility];  Service: General;  Laterality: Left;  . SALPINGOOPHORECTOMY Bilateral 12/09/2014   Procedure: SALPINGO OOPHORECTOMY;  Surgeon: Servando Salina, MD;  Location: [facility];   Service: Gynecology;  Laterality: Bilateral;  . TUBAL LIGATION    . VAGINAL HYSTERECTOMY N/A 12/09/2014   Procedure: HYSTERECTOMY VAGINAL;  Surgeon: Servando Salina, MD;  Location: [facility];  Service: Gynecology;  Laterality: N/A;  . VIDEO ASSISTED THORACOSCOPY (VATS)/ LOBECTOMY Right 12/30/2018   Procedure: VIDEO ASSISTED THORACOSCOPY (VATS)/RIGHT LOWER LOBE WEDGE RESECTION, RIGHT UPPER LOBECTOMY;  Surgeon: Lajuana Matte, MD;  Location: [facility];  Service: Thoracic;  Laterality: Right;  Marland Kitchen VIDEO BRONCHOSCOPY N/A 12/30/2018   Procedure: VIDEO BRONCHOSCOPY;  Surgeon: Lajuana Matte, MD;  Location[facility];  Service: Thoracic;  Laterality: N/A;    SOCIAL HISTORY:  Social History   Socioeconomic History  . Marital status: Divorced    Spouse name: Not on file  . Number of children: 2  . Years of education: Not on file  .  Highest education level: Not on file  Occupational History  . Not on file  Tobacco Use  . Smoking status: Former Smoker    Packs/day: 0.50    Years: 50.00    Pack years: 25.00    Types: Cigarettes    Quit date: 11/25/2018    Years since quitting: 1.0  . Smokeless tobacco: Never Used  Vaping Use  . Vaping Use: Never used  Substance and Sexual Activity  . Alcohol use: Yes    Comment: occasional  . Drug use: No  . Sexual activity: Yes    Birth control/protection: Post-menopausal  Other Topics Concern  . Not on file  Social History Narrative  . Not on file   Social Determinants of Health   Financial Resource Strain:   . Difficulty of Paying Living Expenses: Not on file  Food Insecurity:   . Worried About Charity fundraiser in the Last Year: Not on file  . Ran Out of Food in the Last Year: Not on file  Transportation Needs:   . Lack of Transportation (Medical): Not on file  . Lack of Transportation (Non-Medical): Not on file  Physical Activity:   . Days of Exercise per Week: Not on file  . Minutes of Exercise per Session: Not on file  Stress:   .  Feeling of Stress : Not on file  Social Connections:   . Frequency of Communication with Friends and Family: Not on file  . Frequency of Social Gatherings with Friends and Family: Not on file  . Attends Religious Services: Not on file  . Active Member of Clubs or Organizations: Not on file  . Attends Archivist Meetings: Not on file  . Marital Status: Not on file  Intimate Partner Violence:   . Fear of Current or Ex-Partner: Not on file  . Emotionally Abused: Not on file  . Physically Abused: Not on file  . Sexually Abused: Not on file    FAMILY HISTORY:  Family History  Problem Relation Age of Onset  . Hypertension Mother   . Heart failure Mother   . Congestive Heart Failure Mother   . COPD Mother   . Pernicious anemia Mother   . Cancer Mother        lung  . Hypertension Father   . CAD Father   . Heart attack Father   . Hypertension Sister   . Cancer Other   . Celiac disease Other     CURRENT MEDICATIONS:  Current Outpatient Medications  Medication Sig Dispense Refill  . albuterol (PROVENTIL HFA;VENTOLIN HFA) 108 (90 Base) MCG/ACT inhaler Inhale 2 puffs into the lungs every 6 (six) hours as needed for wheezing or shortness of breath. 1 Inhaler 2  . albuterol (PROVENTIL) (2.5 MG/3ML) 0.083% nebulizer solution Take 2.5 mg by nebulization every 6 (six) hours.    Marland Kitchen aspirin EC 81 MG tablet Take 81 mg by mouth daily.    . Calcium Carb-Cholecalciferol (CALCIUM 600+D) 600-800 MG-UNIT TABS Take 1 tablet by mouth 2 (two) times daily.    . Cholecalciferol (VITAMIN D-3) 1000 units CAPS Take 1,000 Units daily by mouth.     . clonazePAM (KLONOPIN) 0.5 MG tablet TAKE ONE TABLET BY MOUTH AT BEDTIME. 90 tablet 0  . cyanocobalamin (,VITAMIN B-12,) 1000 MCG/ML injection INJECT 1 ML INTO THE MUSCLE ONCE MONTHLY AS DIRECTED. (Patient taking differently: Inject 1,000 mcg into the muscle every 30 (thirty) days. ) 1 mL 5  . dexamethasone (DECADRON) 2 MG tablet Take  1 tablet (2 mg total)  by mouth daily. 30 tablet 3  . dronabinol (MARINOL) 2.5 MG capsule Take 1 capsule (2.5 mg total) by mouth 2 (two) times daily before a meal. 60 capsule 3  . fluconazole (DIFLUCAN) 150 MG tablet Take 150 mg by mouth daily.    Marland Kitchen gabapentin (NEURONTIN) 600 MG tablet TAKE ONE TABLET BY MOUTH ONCE DAILY. 30 tablet 0  . gabapentin (NEURONTIN) 600 MG tablet Take 1 tablet (600 mg total) by mouth daily. 30 tablet 6  . guaiFENesin (MUCINEX) 600 MG 12 hr tablet Take 1 tablet (600 mg total) by mouth 2 (two) times daily as needed for cough or to loosen phlegm.    . ibandronate (BONIVA) 150 MG tablet Take 150 mg by mouth every 30 (thirty) days.     Marland Kitchen ibuprofen (ADVIL,MOTRIN) 800 MG tablet Take 800 mg by mouth every 8 (eight) hours as needed for moderate pain.     Marland Kitchen ipratropium (ATROVENT) 0.02 % nebulizer solution Take by nebulization.    Marland Kitchen ipratropium-albuterol (DUONEB) 0.5-2.5 (3) MG/3ML SOLN Take 3 mLs by nebulization every 6 (six) hours as needed (shortness of breath). 360 mL 0  . lidocaine-prilocaine (EMLA) cream Apply a small amount to port a cath site and cover with plastic wrap 1 hour prior to chemotherapy appointments 30 g 3  . methocarbamol (ROBAXIN) 500 MG tablet Take 500 mg by mouth every 6 (six) hours as needed for muscle spasms.     . nitroGLYCERIN (NITRODUR - DOSED IN MG/24 HR) 0.1 mg/hr patch Place 0.1 mg onto the skin daily.     . ondansetron (ZOFRAN) 8 MG tablet Take 1 tablet (8 mg total) by mouth every 8 (eight) hours as needed for refractory nausea / vomiting. Start on day 3 after chemotherapy. 30 tablet 1  . OXYGEN Inhale 3 L into the lungs daily.    . pantoprazole (PROTONIX) 40 MG tablet TAKE ONE TABLET BY MOUTH TWICE DAILY. 60 tablet 0  . polyethylene glycol (MIRALAX / GLYCOLAX) 17 g packet Take 17 g by mouth daily as needed for mild constipation.    . potassium chloride SA (KLOR-CON) 20 MEQ tablet Take 1 tablet (20 mEq total) by mouth 3 (three) times daily. 60 tablet 2  . rizatriptan  (MAXALT) 10 MG tablet SMARTSIG:1 Tablet(s) By Mouth 1 to 2 Times Daily    . rizatriptan (MAXALT) 5 MG tablet SMARTSIG:1-2 Tablet(s) By Mouth 1 to 2 Times Daily PRN    . rosuvastatin (CRESTOR) 40 MG tablet Take 40 mg at bedtime by mouth.     . traMADol (ULTRAM) 50 MG tablet Take 50 mg by mouth every 6 (six) hours as needed.     No current facility-administered medications for this visit.    ALLERGIES:  Allergies  Allergen Reactions  . Penicillins Hives and Rash    Did it involve swelling of the face/tongue/throat, SOB, or low BP? No Did it involve sudden or severe rash/hives, skin peeling, or any reaction on the inside of your mouth or nose? No Did you need to seek medical attention at a hospital or doctor's office? No When did it last happen?5-10 year If all above answers are "NO", may proceed with cephalosporin use.    . Tape Rash    Medipore, Coban, and paper tape CAN be tolerated    PHYSICAL EXAM:  Performance status (ECOG): 1 - Symptomatic but completely ambulatory  Vitals:   12/15/19 0853  BP: 93/60  Pulse: (!) 56  Resp: 16  Temp: (!) 96.9 F (36.1 C)  SpO2: 94%   Wt Readings from Last 3 Encounters:  12/15/19 94 lb 9.2 oz (42.9 kg)  11/30/19 99 lb 3.2 oz (45 kg)  11/17/19 98 lb 5.2 oz (44.6 kg)   Physical Exam Vitals reviewed.  Constitutional:      Appearance: Normal appearance.  Cardiovascular:     Rate and Rhythm: Normal rate and regular rhythm.     Pulses: Normal pulses.     Heart sounds: Normal heart sounds.  Pulmonary:     Effort: Pulmonary effort is normal.     Breath sounds: Normal breath sounds.  Chest:     Chest wall: Tenderness (mid-sternum) present.     Comments: Port-a-Cath in L chest Abdominal:     Palpations: Abdomen is soft.     Tenderness: There is no abdominal tenderness.  Musculoskeletal:     Thoracic back: Bony tenderness (mid-thoracic spinal processes tenderness) present.     Right lower leg: No edema.     Left lower leg: No  edema.  Neurological:     General: No focal deficit present.     Mental Status: She is alert and oriented to person, place, and time.  Psychiatric:        Mood and Affect: Mood normal.        Behavior: Behavior normal.     LABORATORY DATA:  I have reviewed the labs as listed.  CBC Latest Ref Rng & Units 12/15/2019 11/17/2019 10/20/2019  WBC 4.0 - 10.5 K/uL 18.8(H) 6.6 6.8  Hemoglobin 12.0 - 15.0 g/dL 10.3(L) 11.4(L) 10.8(L)  Hematocrit 36 - 46 % 32.3(L) 35.8(L) 33.9(L)  Platelets 150 - 400 K/uL 574(H) 384 387   CMP Latest Ref Rng & Units 12/15/2019 11/17/2019 10/20/2019  Glucose 70 - 99 mg/dL 191(H) 156(H) 172(H)  BUN 8 - 23 mg/dL '20 14 14  ' Creatinine 0.44 - 1.00 mg/dL 1.33(H) 0.89 0.95  Sodium 135 - 145 mmol/L 131(L) 136 132(L)  Potassium 3.5 - 5.1 mmol/L 3.2(L) 3.4(L) 3.3(L)  Chloride 98 - 111 mmol/L 100 104 100  CO2 22 - 32 mmol/L 21(L) 24 23  Calcium 8.9 - 10.3 mg/dL 8.5(L) 8.9 8.5(L)  Total Protein 6.5 - 8.1 g/dL 7.0 6.8 6.4(L)  Total Bilirubin 0.3 - 1.2 mg/dL 0.7 0.6 0.4  Alkaline Phos 38 - 126 U/L 80 59 66  AST 15 - 41 U/L '27 19 22  ' ALT 0 - 44 U/L '16 13 14    ' DIAGNOSTIC IMAGING:  I have independently reviewed the scans and discussed with the patient. NM PET Image Restag (PS) Skull Base To Thigh  Result Date: 11/25/2019 CLINICAL DATA:  Subsequent treatment strategy for lung cancer. EXAM: NUCLEAR MEDICINE PET SKULL BASE TO THIGH TECHNIQUE: 5.13 mCi F-18 FDG was injected intravenously. Full-ring PET imaging was performed from the skull base to thigh after the radiotracer. CT data was obtained and used for attenuation correction and anatomic localization. Fasting blood glucose: 87 mg/dl COMPARISON:  09/28/2019 FINDINGS: Mediastinal blood pool activity: SUV max 2.17 Liver activity: SUV max NA NECK: No hypermetabolic lymph nodes in the neck. Incidental CT findings: none CHEST: There is progressive increased soft tissue surrounding the right anterior lung suture line. This measures 3.2 x  1.6 cm and has an SUV max of 9.9, image 72/4. Separate area of progressive soft tissue within the right hilum measures 2.7 x 1.2 cm with SUV max of 10.3. FDG avid low right paratracheal lymph node measures 1.6 cm and has an SUV  max of 10.1. Incidental CT findings: Post treatment changes with right lung volume loss and pleuroparenchymal scarring is again identified within the right hemithorax. Only mild FDG uptake is associated with these areas. Small left lung nodules are noted which are below resolution of PET-CT including 0.5 x 0.9 cm perifissural lymph node, image 38/8. Moderate centrilobular emphysema with diffuse bronchial wall thickening. Aortic atherosclerosis. Coronary artery atherosclerotic calcifications. ABDOMEN/PELVIS: There is a small subcapsular focus of increased radiotracer uptake overlying the anterolateral right hepatic lobe within SUV max of 5.2 on the corresponding CT images there is a small focus of low attenuation within the anterolateral subcapsular region of the right hepatic lobe measuring 1.6 x 0.7 cm, image 105/4. There is no abnormal radiotracer uptake within the pancreas, spleen, or adrenal glands. Incidental CT findings: Aortic atherosclerosis. No aneurysm. Unchanged low-attenuation right adrenal nodule compatible with a benign adenoma, image 104/4. Bilateral kidney cysts are noted which are incompletely characterized without IV contrast. SKELETON: No focal hypermetabolic activity to suggest skeletal metastasis. Incidental CT findings: none IMPRESSION: 1. Progressive increased soft tissue within the right lung along suture margins concerning for recurrence of disease. 2. FDG avid low right paratracheal lymph node concerning for nodal metastasis. 3. Small subcapsular focus of increased radiotracer uptake overlying the anterolateral right hepatic lobe, also concerning for tumor. 4. Stable right adrenal gland adenoma. 5. Aortic Atherosclerosis (ICD10-I70.0) and Emphysema (ICD10-J43.9). 6.  Coronary artery calcifications. Electronically Signed   By: Kerby Moors M.D.   On: 11/25/2019 09:08     ASSESSMENT:  1. 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 -30 Gy of radiation in 10 fractions from 01/28/2019 through 02/10/2019. -4 cycles of carboplatin and etoposide from 03/09/2019 through 05/11/2019. -PCI completed on 07/31/2019. -We reviewed MRI of the brain on 09/15/2019 with no evidence of brain meta stasis. Concern for hypointense appearance at the C3 vertebral body and left articular process. -CT chest on 09/15/2019 shows numerous new small solid irregular pulmonary nodules scattered throughout both lungs, largest 7 mm on the right lower lobe. Tiny loculated anterior right pleural effusion decreased. Stable mild subcarinal adenopathy. Bilateral stable adrenal adenomas. -MRI of the C-spine on 10/13/2019 showed marrow edema and enhancement involving C3 vertebral body and left articular process without discrete bone lesion. This was thought to be degenerative. -PET scan on 09/28/2019 shows bilateral lung nodules, below PET resolution. Small right-sided nodular densities demonstrating low-level hypermetabolism. Mild hypermetabolic corresponding to a normal-sized subcarinal lymph node. Multifocal right pleural hypermetabolism in the setting of pleural thickening and prior right upper lobectomy, indeterminate. No hypermetabolic extrathoracic disease. -PET scan on 11/24/2019 shows progressive increased soft tissue within the right lung along the suture margins, positive right paratracheal lymph node, small subcapsular focus of increased radiotracer uptake overlying the anterolateral right hepatic lobe concerning for tumor.   PLAN:  1. Stage III  (T3N1) small cell lung cancer: -I have reviewed her LFTs.  Albumin is low at 2.9. -As her creatinine gone up to 1.33, will dose reduce lurbinectedin by 1 dose level. -She will come back in 1 week for follow-up.  I plan to see her in 3 weeks prior to chemotherapy.  2. Essential thrombocytosis: -Hydrea on hold since chemotherapy started for lung cancer.  White count and platelet count increased slightly today.  3. Macrocytic anemia: -  CBC today shows hemoglobin 10.3.  Macrocytosis resolved.  4. Weight loss: -She lost 5 pounds in the last couple of weeks.  Insurance did not cover Marinol. -We will start her on Decadron 1 mg in the morning.  She was told to increase boost intake.  5.  Mid back and sternal pain: -She is taking Percocet 2 to 3 tablets daily along with tramadol twice daily. -We will change her Percocet to oxycodone.  We have sent a prescription.  6.  Hypokalemia: -Potassium is 3.2. -We will start her on potassium 20 mEq daily.   Orders placed this encounter:  No orders of the defined types were placed in this encounter.    Derek Jack, MD [place] [phone]   I, Milinda Antis, am acting as a scribe for Dr. Sanda Linger.  I, Derek Jack MD, have reviewed the above documentation for accuracy and completeness, and I agree with the above.

## 2019-12-15 NOTE — Patient Instructions (Signed)
[facility] at [facility] Discharge Instructions  Labs drawn from portacath today   Thank you for choosing [facility] at [facility] to provide your oncology and hematology care.  To afford each patient quality time with our provider, please arrive at least 15 minutes before your scheduled appointment time.   If you have a lab appointment with the Cancer Center please come in thru the Main Entrance and check in at the main information desk.  You need to re-schedule your appointment should you arrive 10 or more minutes late.  We strive to give you quality time with our providers, and arriving late affects you and other patients whose appointments are after yours.  Also, if you no show three or more times for appointments you may be dismissed from the clinic at the providers discretion.     Again, thank you for choosing [facility].  Our hope is that these requests will decrease the amount of time that you wait before being seen by our physicians.       _____________________________________________________________  Should you have questions after your visit to  Cancer Center, please contact our office at [phone] and follow the prompts.  Our office hours are 8:00 a.m. and 4:30 p.m. Monday - Friday.  Please note that voicemails left after 4:00 p.m. may not be returned until the following business day.  We are closed weekends and major holidays.  You do have access to a nurse 24-7, just call the main number to the clinic [phone] and do not press any options, hold on the line and a nurse will answer the phone.    For prescription refill requests, have your pharmacy contact our office and allow 72 hours.    Due to Covid, you will need to wear a mask upon entering the hospital. If you do not have a mask, a mask will be given to you at the Main Entrance upon arrival. For doctor visits, patients may have 1 support person age 18  or older with them. For treatment visits, patients can not have anyone with them due to social distancing guidelines and our immunocompromised population.     

## 2019-12-16 NOTE — Progress Notes (Signed)
14:59 pm 24 hour call back. Patient denies any issues from yesterday's treatment. Patient states she feels shaky from the steroids given pre-med per patient's words. Patient instructed to increase her fluids and eat several small meals per day. Understanding verbalized. Patient denies any nause, vomiting, diarrhea.

## 2019-12-22 ENCOUNTER — Inpatient Hospital Stay (HOSPITAL_COMMUNITY): Payer: Medicare Other

## 2019-12-22 ENCOUNTER — Inpatient Hospital Stay (HOSPITAL_COMMUNITY): Payer: Medicare Other | Admitting: Nurse Practitioner

## 2019-12-22 ENCOUNTER — Other Ambulatory Visit: Payer: Self-pay

## 2019-12-22 ENCOUNTER — Encounter (HOSPITAL_COMMUNITY): Payer: Self-pay | Admitting: Nurse Practitioner

## 2019-12-22 ENCOUNTER — Ambulatory Visit (HOSPITAL_COMMUNITY): Payer: Medicare Other | Admitting: Nurse Practitioner

## 2019-12-22 ENCOUNTER — Inpatient Hospital Stay (HOSPITAL_COMMUNITY): Payer: Medicare Other | Attending: Hematology | Admitting: Nurse Practitioner

## 2019-12-22 DIAGNOSIS — Z9221 Personal history of antineoplastic chemotherapy: Secondary | ICD-10-CM | POA: Diagnosis not present

## 2019-12-22 DIAGNOSIS — Z5111 Encounter for antineoplastic chemotherapy: Secondary | ICD-10-CM | POA: Diagnosis not present

## 2019-12-22 DIAGNOSIS — C3411 Malignant neoplasm of upper lobe, right bronchus or lung: Secondary | ICD-10-CM | POA: Diagnosis present

## 2019-12-22 DIAGNOSIS — Z85820 Personal history of malignant melanoma of skin: Secondary | ICD-10-CM | POA: Diagnosis not present

## 2019-12-22 DIAGNOSIS — Z853 Personal history of malignant neoplasm of breast: Secondary | ICD-10-CM | POA: Insufficient documentation

## 2019-12-22 DIAGNOSIS — Z171 Estrogen receptor negative status [ER-]: Secondary | ICD-10-CM | POA: Diagnosis not present

## 2019-12-22 DIAGNOSIS — Z79899 Other long term (current) drug therapy: Secondary | ICD-10-CM | POA: Insufficient documentation

## 2019-12-22 DIAGNOSIS — D539 Nutritional anemia, unspecified: Secondary | ICD-10-CM | POA: Insufficient documentation

## 2019-12-22 DIAGNOSIS — D473 Essential (hemorrhagic) thrombocythemia: Secondary | ICD-10-CM | POA: Insufficient documentation

## 2019-12-22 DIAGNOSIS — Z923 Personal history of irradiation: Secondary | ICD-10-CM | POA: Insufficient documentation

## 2019-12-22 DIAGNOSIS — Z9012 Acquired absence of left breast and nipple: Secondary | ICD-10-CM | POA: Insufficient documentation

## 2019-12-22 DIAGNOSIS — C3491 Malignant neoplasm of unspecified part of right bronchus or lung: Secondary | ICD-10-CM | POA: Diagnosis not present

## 2019-12-22 LAB — CBC WITH DIFFERENTIAL/PLATELET
Abs Immature Granulocytes: 0.07 10*3/uL (ref 0.00–0.07)
Basophils Absolute: 0 10*3/uL (ref 0.0–0.1)
Basophils Relative: 0 %
Eosinophils Absolute: 0.1 10*3/uL (ref 0.0–0.5)
Eosinophils Relative: 3 %
HCT: 36.7 % (ref 36.0–46.0)
Hemoglobin: 11.7 g/dL — ABNORMAL LOW (ref 12.0–15.0)
Immature Granulocytes: 2 %
Lymphocytes Relative: 18 %
Lymphs Abs: 0.9 10*3/uL (ref 0.7–4.0)
MCH: 28.1 pg (ref 26.0–34.0)
MCHC: 31.9 g/dL (ref 30.0–36.0)
MCV: 88 fL (ref 80.0–100.0)
Monocytes Absolute: 0.1 10*3/uL (ref 0.1–1.0)
Monocytes Relative: 2 %
Neutro Abs: 3.6 10*3/uL (ref 1.7–7.7)
Neutrophils Relative %: 75 %
Platelets: 571 10*3/uL — ABNORMAL HIGH (ref 150–400)
RBC: 4.17 MIL/uL (ref 3.87–5.11)
RDW: 15.5 % (ref 11.5–15.5)
WBC: 4.8 10*3/uL (ref 4.0–10.5)
nRBC: 0 % (ref 0.0–0.2)

## 2019-12-22 LAB — COMPREHENSIVE METABOLIC PANEL
ALT: 21 U/L (ref 0–44)
AST: 20 U/L (ref 15–41)
Albumin: 2.7 g/dL — ABNORMAL LOW (ref 3.5–5.0)
Alkaline Phosphatase: 67 U/L (ref 38–126)
Anion gap: 9 (ref 5–15)
BUN: 13 mg/dL (ref 8–23)
CO2: 25 mmol/L (ref 22–32)
Calcium: 8.2 mg/dL — ABNORMAL LOW (ref 8.9–10.3)
Chloride: 100 mmol/L (ref 98–111)
Creatinine, Ser: 0.82 mg/dL (ref 0.44–1.00)
GFR calc Af Amer: >60 mL/min (ref >60)
GFR calc non Af Amer: >60 mL/min (ref >60)
Glucose, Bld: 98 mg/dL (ref 70–99)
Potassium: 3.9 mmol/L (ref 3.5–5.1)
Sodium: 134 mmol/L — ABNORMAL LOW (ref 135–145)
Total Bilirubin: 0.7 mg/dL (ref 0.3–1.2)
Total Protein: 6.2 g/dL — ABNORMAL LOW (ref 6.5–8.1)

## 2019-12-22 LAB — MAGNESIUM: Magnesium: 1.8 mg/dL (ref 1.7–2.4)

## 2019-12-22 MED ORDER — SODIUM CHLORIDE 0.9% FLUSH
10.0000 mL | INTRAVENOUS | Status: AC | PRN
  Administered 2019-12-22: 10 mL via INTRAVENOUS

## 2019-12-22 MED ORDER — SODIUM CHLORIDE 0.9 % IV SOLN
Freq: Once | INTRAVENOUS | Status: DC
  Filled 2019-12-22: qty 10

## 2019-12-22 MED ORDER — HEPARIN SOD (PORK) LOCK FLUSH 100 UNIT/ML IV SOLN
500.0000 [IU] | Freq: Once | INTRAVENOUS | Status: AC
  Administered 2019-12-22: 500 [IU] via INTRAVENOUS

## 2019-12-22 MED ORDER — SODIUM CHLORIDE 0.9 % IV SOLN
Freq: Once | INTRAVENOUS | Status: AC
  Filled 2019-12-22: qty 10

## 2019-12-22 NOTE — Progress Notes (Unsigned)
Stanly reviewed with and pt seen by RLockamy NP and pt to receive IV hydration with magnesium and potassium over 1 hour per NP

## 2019-12-22 NOTE — Progress Notes (Signed)
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 The 3 liver lesions are not hypermetabolic. The radiologist suspects they may be subtly present on prior CT chest from 10/2013, to further reassuring that these are likely benign lesions.   Melanoma ([place])  07/09/2013 Initial Biopsy   Initial biopsy L upper thigh/buttocks melanoma   07/20/2013 Surgery   Excision L lateral buttock/upper thigh melanoma, clear margins   07/20/2013 Pathology Results   Breslow depth 1.02 mm, pT2a    Small cell lung carcinoma, right ([place])  11/27/2018 Initial Diagnosis   Small cell lung carcinoma, right ([place])   03/09/2019 - 05/15/2019 Chemotherapy   The patient had palonosetron (ALOXI) injection 0.25 mg, 0.25 mg, Intravenous,  Once, 4 of 4 cycles Administration: 0.25 mg (03/09/2019), 0.25 mg (03/30/2019), 0.25 mg (04/20/2019), 0.25 mg (05/11/2019) pegfilgrastim (NEULASTA ONPRO KIT) injection 6 mg, 6 mg, Subcutaneous, Once, 1 of 1 cycle Administration: 6 mg (03/11/2019) pegfilgrastim-jmdb (FULPHILA) injection 6 mg, 6 mg, Subcutaneous,  Once, 3 of 3 cycles Administration: 6 mg (04/03/2019), 6 mg (04/24/2019), 6 mg (05/15/2019) CARBOplatin (PARAPLATIN) 330 mg in sodium chloride 0.9 % 250 mL chemo  infusion, 330 mg (100 % of original dose 325.5 mg), Intravenous,  Once, 4 of 4 cycles Dose modification:   (original dose 325.5 mg, Cycle 1),   (original dose 325.5 mg, Cycle 2),   (original dose 309 mg, Cycle 3),   (original dose 325.5 mg, Cycle 4) Administration: 330 mg (03/09/2019), 330 mg (03/30/2019), 310 mg (04/20/2019), 330 mg (05/11/2019) etoposide (VEPESID) 150 mg in sodium chloride 0.9 % 500 mL chemo infusion, 100 mg/m2 = 150 mg, Intravenous,  Once, 4 of 4 cycles Administration: 150 mg (03/09/2019), 150 mg (03/10/2019), 150 mg (03/11/2019), 150 mg (03/30/2019), 150 mg (03/31/2019), 150 mg (04/01/2019), 150 mg (04/20/2019), 150 mg (04/21/2019), 150 mg (04/22/2019), 150 mg (05/11/2019), 150 mg (05/12/2019), 150 mg (05/13/2019) fosaprepitant (EMEND) 150 mg, dexamethasone (DECADRON) 12 mg in sodium chloride 0.9 % 145 mL IVPB, , Intravenous,  Once, 4 of 4 cycles Administration:  (03/09/2019),  (03/30/2019),  (04/20/2019),  (05/11/2019)  for chemotherapy treatment.    03/09/2019 Cancer Staging   Staging form: Lung, AJCC 8th Edition - Clinical: Stage IIIA (cT3, cN1, cM0) - Signed by Derek Jack, MD on 03/09/2019   12/15/2019 -  Chemotherapy   The patient had palonosetron (ALOXI) injection 0.25 mg, 0.25 mg, Intravenous,  Once, 1 of 4 cycles Administration: 0.25 mg (12/15/2019) lurbinectedin (ZEPZELCA) 3.65 mg in sodium chloride 0.9 % 250 mL chemo infusion, 2.6 mg/m2 = 3.65 mg (81.3 % of original dose 3.2 mg/m2), Intravenous,  Once, 1 of 4 cycles Dose modification: 2.6 mg/m2 (original dose 3.2 mg/m2, Cycle 1, Reason: Provider Judgment) Administration: 3.65 mg (  12/15/2019)  for chemotherapy treatment.      CANCER STAGING: Cancer Staging Small cell lung carcinoma, right (HCC) Staging form: Lung, AJCC 8th Edition - Clinical: Stage IIIA (cT3, cN1, cM0) - Signed by Derek Jack, MD on 03/09/2019    INTERVAL HISTORY:  Ashley [name] 69 y.o. female returns for routine follow-up for small cell lung  cancer.  Patient reports she is having increased pain, nausea, and headache since her last chemotherapy treatment.  She reports the Zofran does help with her nausea some.  She does have pain medication. Denies any nausea, vomiting, or diarrhea. Denies any new pains. Had not noticed any recent bleeding such as epistaxis, hematuria or hematochezia. Denies recent chest pain on exertion, shortness of breath on minimal exertion, pre-syncopal episodes, or palpitations. Denies any numbness or tingling in hands or feet. Denies any recent fevers, infections, or recent hospitalizations. Patient reports appetite at 0% and energy level at 0%.     REVIEW OF SYSTEMS:  Review of Systems  Constitutional: Positive for fatigue.  HENT:   Positive for mouth sores and sore throat.   Respiratory: Positive for cough and shortness of breath.   Gastrointestinal: Positive for abdominal pain and nausea.  Musculoskeletal: Positive for back pain.  Skin: Positive for itching.  Neurological: Positive for headaches and numbness.  Psychiatric/Behavioral: Positive for sleep disturbance.  All other systems reviewed and are negative.    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 No evidence of ischemia/injury based upon normal troponin I. Chest discomfort could be tachycardia induced supply demand mismatch.    CHF (congestive heart failure) ([place]) 11/17/2015   after surgery    Colon adenomas    Coronary artery disease    DJD (degenerative joint disease)    Dyspnea    with exertion   Emphysema of lung ([place]) 11/17/2015   Essential hypertension, benign    GERD (gastroesophageal reflux disease)    History of hiatal hernia    Hyperlipidemia    Hypertension    Melanoma  ([place]) 01/09/2016   Osteopenia    Palpitations    Pernicious anemia 03/06/2016   Pernicious anemia    Pure hypercholesterolemia    Raynaud's disease    Thrombocythemia, essential ([place]) 01/09/2016   Thrombocytosis (HCC)    Idiopathic   Vitamin D deficiency    Past Surgical History:  Procedure Laterality Date   ABDOMINAL HYSTERECTOMY     ANTERIOR AND POSTERIOR REPAIR N/A 12/09/2014   Procedure: ANTERIOR (CYSTOCELE) AND POSTERIOR REPAIR (RECTOCELE);  Surgeon: Bjorn Loser, MD;  Location: [facility];  Service: Urology;  Laterality: N/A;   ANTERIOR CERVICAL DECOMPRESSION/DISCECTOMY FUSION 4 LEVELS Right 10/03/2016   Procedure: ANTERIOR CERVICAL DECOMPRESSION FUSION, CERVICAL 4-5, CERVICAL 5-6, CERVICAL 6-7, CERVICAL 7 TO THORACIC 1 WITH INSTRUMENTATION AND ALLOGRAFT;  Surgeon: Phylliss Bob, MD;  Location: [place];  Service: Orthopedics;  Laterality: Right;  ANTERIOR CERVICAL DECOMPRESSION FUSION, CERVICAL 4-5, CERVICAL 5-6, CERVICAL 6-7, CERVICAL 7 TO THORACIC 1 WITH INSTRUMENTATION AND ALLOGRAFT; REQUEST 4 HO   ANTERIOR LAT LUMBAR FUSION Left 11/16/2015   Procedure: LEFT SIDED LATERAL INTERBODY FUSION, LUMBAR 2-3, LUMBAR 3-4, LUMBAR 4-5 WITH INSTRUMENTATION;  Surgeon: Phylliss Bob, MD;  Location: [place];  Service: Orthopedics;  Laterality: Left;  LEFT  SIDED LATEARL INTERBODY FUSION, LUMBAR 2-3, LUMBAR 3-4, LUMBAR 4-5 WITH INSTRUMENTATION    APPENDECTOMY     BACK SURGERY     BONE MARROW ASPIRATION  07/2012   BONE MARROW BIOPSY  07/2012   BREAST SURGERY     CARDIAC CATHETERIZATION     CARDIAC CATHETERIZATION N/A 01/20/2016   Procedure: Left Heart Cath and Coronary Angiography;  Surgeon: Burnell Blanks, MD;  Location: [facility];  Service: Cardiovascular;  Laterality: N/A;   COLONOSCOPY  11/29/2010   Procedure: COLONOSCOPY;  Surgeon: Rogene Houston, MD;  [facility];  Service: Endoscopy;  Laterality: N/A;   COLONOSCOPY N/A 02/18/2014   Procedure:  COLONOSCOPY;  Surgeon: Rogene Houston, MD;  [facility];  Service: Endoscopy;  Laterality: N/A;  1030   COLONOSCOPY N/A 02/25/2017   Procedure: COLONOSCOPY;  Surgeon: Rogene Houston, MD;  [facility];  Service: Endoscopy;  Laterality: N/A;  10:55   CYSTOSCOPY N/A 12/09/2014   Procedure: CYSTOSCOPY;  Surgeon: Bjorn Loser, MD;  Location: [facility];  Service: Urology;  Laterality: N/A;   ESOPHAGEAL DILATION N/A 02/25/2017   Procedure: ESOPHAGEAL DILATION;  Surgeon: Rogene Houston, MD;  [facility];  Service: Endoscopy;  Laterality: N/A;   ESOPHAGOGASTRODUODENOSCOPY N/A 02/25/2017   Procedure: ESOPHAGOGASTRODUODENOSCOPY (EGD);  Surgeon: Rogene Houston, MD;  [facility];  Service: Endoscopy;  Laterality: N/A;   IR GENERIC HISTORICAL  01/11/2016   IR RADIOLOGY PERIPHERAL GUIDED IV START 01/11/2016 Saverio Danker, PA-C [facility]   IR GENERIC HISTORICAL  01/11/2016   IR US GUIDE VASC ACCESS RIGHT 01/11/2016 Saverio Danker, PA-C [facility]   LYMPH NODE DISSECTION Right 12/30/2018   Procedure: Lymph Node Dissection;  Surgeon: Lajuana Matte, MD;  Location: [place];  Service: Thoracic;  Laterality: Right;   MASTECTOMY     left   OVARIAN CYST SURGERY     x2   POLYPECTOMY  02/25/2017   Procedure: POLYPECTOMY;  Surgeon: Rogene Houston, MD;  [facility];  Service: Endoscopy;;   PORTACATH PLACEMENT Left 02/16/2019   Procedure: INSERTION PORT-A-CATH (CATHETER  LEFT SUBCLAVIAN);  Surgeon: Aviva Signs, MD;  Location: [facility];  Service: General;  Laterality: Left;   SALPINGOOPHORECTOMY Bilateral 12/09/2014   Procedure: SALPINGO OOPHORECTOMY;  Surgeon: Servando Salina, MD;  Location: [facility];  Service: Gynecology;  Laterality: Bilateral;   TUBAL LIGATION     VAGINAL HYSTERECTOMY N/A 12/09/2014   Procedure: HYSTERECTOMY VAGINAL;  Surgeon: Servando Salina, MD;  Location: [facility];  Service: Gynecology;  Laterality: N/A;    VIDEO ASSISTED THORACOSCOPY (VATS)/ LOBECTOMY Right 12/30/2018   Procedure: VIDEO ASSISTED THORACOSCOPY (VATS)/RIGHT LOWER LOBE WEDGE RESECTION, RIGHT UPPER LOBECTOMY;  Surgeon: Lajuana Matte, MD;  Location: [place];  Service: Thoracic;  Laterality: Right;   VIDEO BRONCHOSCOPY N/A 12/30/2018   Procedure: VIDEO BRONCHOSCOPY;  Surgeon: Lajuana Matte, MD;  Location[facility];  Service: Thoracic;  Laterality: N/A;     SOCIAL HISTORY:  Social History   Socioeconomic History   Marital status: Divorced    Spouse name: Not on file   Number of children: 2   Years of education: Not on file   Highest education level: Not on file  Occupational History   Not on file  Tobacco Use   Smoking status: Former Smoker    Packs/day: 0.50    Years: 50.00    Pack years: 25.00    Types: Cigarettes  Quit date: 11/25/2018    Years since quitting: 1.0   Smokeless tobacco: Never Used  Vaping Use   Vaping Use: Never used  Substance and Sexual Activity   Alcohol use: Yes    Comment: occasional   Drug use: No   Sexual activity: Yes    Birth control/protection: Post-menopausal  Other Topics Concern   Not on file  Social History Narrative   Not on file   Social Determinants of Health   Financial Resource Strain:    Difficulty of Paying Living Expenses: Not on file  Food Insecurity:    Worried About Charity fundraiser in the Last Year: Not on file   [employer] of Food in the Last Year: Not on file  Transportation Needs:    Lack of Transportation (Medical): Not on file   Lack of Transportation (Non-Medical): Not on file  Physical Activity:    Days of Exercise per Week: Not on file   Minutes of Exercise per Session: Not on file  Stress:    Feeling of Stress : Not on file  Social Connections:    Frequency of Communication with Friends and Family: Not on file   Frequency of Social Gatherings with Friends and Family: Not on file   Attends Religious Services: Not on  file   Active Member of Clubs or Organizations: Not on file   Attends Archivist Meetings: Not on file   Marital Status: Not on file  Intimate Partner Violence:    Fear of Current or Ex-Partner: Not on file   Emotionally Abused: Not on file   Physically Abused: Not on file   Sexually Abused: Not on file    FAMILY HISTORY:  Family History  Problem Relation Age of Onset   Hypertension Mother    Heart failure Mother    Congestive Heart Failure Mother    COPD Mother    Pernicious anemia Mother    Cancer Mother        lung   Hypertension Father    CAD Father    Heart attack Father    Hypertension Sister    Cancer Other    Celiac disease Other     CURRENT MEDICATIONS:  Outpatient Encounter Medications as of 12/22/2019  Medication Sig   albuterol (PROVENTIL HFA;VENTOLIN HFA) 108 (90 Base) MCG/ACT inhaler Inhale 2 puffs into the lungs every 6 (six) hours as needed for wheezing or shortness of breath.   albuterol (PROVENTIL) (2.5 MG/3ML) 0.083% nebulizer solution Take 2.5 mg by nebulization every 6 (six) hours.   aspirin EC 81 MG tablet Take 81 mg by mouth daily.   Calcium Carb-Cholecalciferol (CALCIUM 600+D) 600-800 MG-UNIT TABS Take 1 tablet by mouth 2 (two) times daily.   Cholecalciferol (VITAMIN D-3) 1000 units CAPS Take 1,000 Units daily by mouth.    clonazePAM (KLONOPIN) 0.5 MG tablet TAKE ONE TABLET BY MOUTH AT BEDTIME.   cyanocobalamin (,VITAMIN B-12,) 1000 MCG/ML injection INJECT 1 ML INTO THE MUSCLE ONCE MONTHLY AS DIRECTED. (Patient taking differently: Inject 1,000 mcg into the muscle every 30 (thirty) days. )   dexamethasone (DECADRON) 2 MG tablet Take 1 tablet (2 mg total) by mouth daily.   dronabinol (MARINOL) 2.5 MG capsule Take 1 capsule (2.5 mg total) by mouth 2 (two) times daily before a meal.   fluconazole (DIFLUCAN) 150 MG tablet Take 150 mg by mouth daily.   gabapentin (NEURONTIN) 600 MG tablet TAKE ONE TABLET BY MOUTH ONCE  DAILY.   gabapentin (NEURONTIN) 600  MG tablet Take 1 tablet (600 mg total) by mouth daily.   guaiFENesin (MUCINEX) 600 MG 12 hr tablet Take 1 tablet (600 mg total) by mouth 2 (two) times daily as needed for cough or to loosen phlegm.   ibandronate (BONIVA) 150 MG tablet Take 150 mg by mouth every 30 (thirty) days.    ibuprofen (ADVIL,MOTRIN) 800 MG tablet Take 800 mg by mouth every 8 (eight) hours as needed for moderate pain.    ipratropium (ATROVENT) 0.02 % nebulizer solution Take by nebulization.   ipratropium-albuterol (DUONEB) 0.5-2.5 (3) MG/3ML SOLN Take 3 mLs by nebulization every 6 (six) hours as needed (shortness of breath).   lidocaine-prilocaine (EMLA) cream Apply a small amount to port a cath site and cover with plastic wrap 1 hour prior to chemotherapy appointments   methocarbamol (ROBAXIN) 500 MG tablet Take 500 mg by mouth every 6 (six) hours as needed for muscle spasms.    nitroGLYCERIN (NITRODUR - DOSED IN MG/24 HR) 0.1 mg/hr patch Place 0.1 mg onto the skin daily.    ondansetron (ZOFRAN) 8 MG tablet Take 1 tablet (8 mg total) by mouth every 8 (eight) hours as needed for refractory nausea / vomiting. Start on day 3 after chemotherapy.   oxyCODONE (OXY IR/ROXICODONE) 5 MG immediate release tablet Take 1 tablet (5 mg total) by mouth every 8 (eight) hours.   OXYGEN Inhale 3 L into the lungs daily.   pantoprazole (PROTONIX) 40 MG tablet TAKE ONE TABLET BY MOUTH TWICE DAILY.   polyethylene glycol (MIRALAX / GLYCOLAX) 17 g packet Take 17 g by mouth daily as needed for mild constipation.   potassium chloride SA (KLOR-CON) 20 MEQ tablet Take 1 tablet (20 mEq total) by mouth 3 (three) times daily.   rizatriptan (MAXALT) 10 MG tablet SMARTSIG:1 Tablet(s) By Mouth 1 to 2 Times Daily   rizatriptan (MAXALT) 5 MG tablet SMARTSIG:1-2 Tablet(s) By Mouth 1 to 2 Times Daily PRN   rosuvastatin (CRESTOR) 40 MG tablet Take 40 mg at bedtime by mouth.    traMADol (ULTRAM) 50 MG tablet  Take 50 mg by mouth every 6 (six) hours as needed.   [DISCONTINUED] prochlorperazine (COMPAZINE) 10 MG tablet Take 1 tablet (10 mg total) by mouth every 6 (six) hours as needed (Nausea or vomiting). (Patient not taking: Reported on 11/30/2019)   No facility-administered encounter medications on file as of 12/22/2019.    ALLERGIES:  Allergies  Allergen Reactions   Penicillins Hives and Rash    Did it involve swelling of the face/tongue/throat, SOB, or low BP? No Did it involve sudden or severe rash/hives, skin peeling, or any reaction on the inside of your mouth or nose? No Did you need to seek medical attention at a hospital or doctor's office? No When did it last happen?5-10 year If all above answers are NO, may proceed with cephalosporin use.     Tape Rash    Medipore, Coban, and paper tape CAN be tolerated     PHYSICAL EXAM:  ECOG Performance status: 1  Vitals:   12/22/19 1240  BP: 111/83  Pulse: 78  Resp: 18  Temp: (!) 96.9 F (36.1 C)  SpO2: 98%   Filed Weights   12/22/19 1240  Weight: 98 lb 1.6 oz (44.5 kg)   Physical Exam Constitutional:      Appearance: Normal appearance. She is normal weight.  Cardiovascular:     Rate and Rhythm: Normal rate and regular rhythm.     Heart sounds: Normal heart sounds.  Pulmonary:     Effort: Pulmonary effort is normal.     Breath sounds: Normal breath sounds.  Abdominal:     General: Bowel sounds are normal.     Palpations: Abdomen is soft.  Musculoskeletal:        General: Normal range of motion.  Skin:    General: Skin is warm and dry.  Neurological:     Mental Status: She is alert and oriented to person, place, and time. Mental status is at baseline.  Psychiatric:        Mood and Affect: Mood normal.        Behavior: Behavior normal.        Thought Content: Thought content normal.        Judgment: Judgment normal.      LABORATORY DATA:  I have reviewed the labs as listed.  CBC    Component Value  Date/Time   WBC 4.8 12/22/2019 1239   RBC 4.17 12/22/2019 1239   HGB 11.7 (L) 12/22/2019 1239   HCT 36.7 12/22/2019 1239   PLT 571 (H) 12/22/2019 1239   MCV 88.0 12/22/2019 1239   MCH 28.1 12/22/2019 1239   MCHC 31.9 12/22/2019 1239   RDW 15.5 12/22/2019 1239   LYMPHSABS 0.9 12/22/2019 1239   MONOABS 0.1 12/22/2019 1239   EOSABS 0.1 12/22/2019 1239   BASOSABS 0.0 12/22/2019 1239   CMP Latest Ref Rng & Units 12/22/2019 12/15/2019 11/17/2019  Glucose 70 - 99 mg/dL 98 191(H) 156(H)  BUN 8 - 23 mg/dL '13 20 14  ' Creatinine 0.44 - 1.00 mg/dL 0.82 1.33(H) 0.89  Sodium 135 - 145 mmol/L 134(L) 131(L) 136  Potassium 3.5 - 5.1 mmol/L 3.9 3.2(L) 3.4(L)  Chloride 98 - 111 mmol/L 100 100 104  CO2 22 - 32 mmol/L 25 21(L) 24  Calcium 8.9 - 10.3 mg/dL 8.2(L) 8.5(L) 8.9  Total Protein 6.5 - 8.1 g/dL 6.2(L) 7.0 6.8  Total Bilirubin 0.3 - 1.2 mg/dL 0.7 0.7 0.6  Alkaline Phos 38 - 126 U/L 67 80 59  AST 15 - 41 U/L '20 27 19  ' ALT 0 - 44 U/L '21 16 13    ' All questions were answered to patient's stated satisfaction. Encouraged patient to call with any new concerns or questions before his next visit to the cancer center and we can certain see him sooner, if needed.     ASSESSMENT & PLAN:  Small cell lung carcinoma, right (HCC) 1.  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  Right chest wall biopsy consistent with small cell carcinoma. -30 Gy of radiation in 10 fractions from 01/28/2019 through 02/10/2019. -PET scan on 01/26/2019 shows dense consolidative airspace disease in the posterior right base, markedly hypermetabolic SUV 8.  Loculated anterior pleural fluid in the right hemithorax with adjacent airspace consolidation is hypermetabolic with SUV 4.1.  Hypermetabolic uptake identified along the right lung staple lines.  2.5 cm  focus of the soft tissue in the right axilla with SUV 2.2.  Precarinal lymph node, short Axis IX millimeters with SUV 2.5.  12 mm short axis subcarinal lymph node with SUV 3. -CT of the chest on 05/27/2019 showed improved right middle lobe consolidation.  1.6 cm possible left hepatic lesion. -MRI of the abdomen on 06/26/2019 showed normal left hepatic lobe lesion.  There is hemosiderin deposition in the liver as  well as adrenals. -She completed prophylactic cranial irradiation on 07/31/2019. -She is having increased headaches pain and nausea since her last treatment. -Labs done on 12/22/2019 showed hemoglobin 11.7, WBC 4.8, platelets 571, magnesium 1.8, potassium 3.9, creatinine 0.82 -We will give her 1 L of normal saline along with nausea and pain medicine today. -We will see her back with her next treatment.  2.  Essential thrombocytosis: -Hydroxyurea on hold since chemotherapy for lung cancer. -Platelet count today is 571.     3.  Macrocytic anemia: -This has resolved.  Hemoglobin today is 11.7.       Orders placed this encounter:  Orders Placed This Encounter  Procedures   Lactate dehydrogenase   Magnesium   CBC with Differential/Platelet   Comprehensive metabolic panel      Francene Finders, FNP-C [place] [phone]

## 2019-12-22 NOTE — Patient Instructions (Signed)
[place] at [facility] Discharge Instructions  Received IV hydration with magnesium and potassium today. Follow-up as scheduled   Thank you for choosing [place] at [facility] to provide your oncology and hematology care.  To afford each patient quality time with our provider, please arrive at least 15 minutes before your scheduled appointment time.   If you have a lab appointment with the [facility] please come in thru the Main Entrance and check in at the main information desk.  You need to re-schedule your appointment should you arrive 10 or more minutes late.  We strive to give you quality time with our providers, and arriving late affects you and other patients whose appointments are after yours.  Also, if you no show three or more times for appointments you may be dismissed from the clinic at the providers discretion.     Again, thank you for choosing [facility].  Our hope is that these requests will decrease the amount of time that you wait before being seen by our physicians.       _____________________________________________________________  Should you have questions after your visit to [facility], please contact our office at [phone] and follow the prompts.  Our office hours are 8:00 a.m. and 4:30 p.m. Monday - Friday.  Please note that voicemails left after 4:00 p.m. may not be returned until the following business day.  We are closed weekends and major holidays.  You do have access to a nurse 24-7, just call the main number to the clinic [phone] and do not press any options, hold on the line and a nurse will answer the phone.    For prescription refill requests, have your pharmacy contact our office and allow 72 hours.    Due to Covid, you will need to wear a mask upon entering the hospital. If you do not have a mask, a mask will be given to you at the Main Entrance upon arrival. For doctor  visits, patients may have 1 support person age 53 or older with them. For treatment visits, patients can not have anyone with them due to social distancing guidelines and our immunocompromised population.

## 2019-12-22 NOTE — Assessment & Plan Note (Signed)
1.  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  Right chest wall biopsy consistent with small cell carcinoma. -30 Gy of radiation in 10 fractions from 01/28/2019 through 02/10/2019. -PET scan on 01/26/2019 shows dense consolidative airspace disease in the posterior right base, markedly hypermetabolic SUV 8.  Loculated anterior pleural fluid in the right hemithorax with adjacent airspace consolidation is hypermetabolic with SUV 4.1.  Hypermetabolic uptake identified along the right lung staple lines.  2.5 cm focus of the soft tissue in the right axilla with SUV 2.2.  Precarinal lymph node, short Axis IX millimeters with SUV 2.5.  12 mm short axis subcarinal lymph node with SUV 3. -CT of the chest on 05/27/2019 showed improved right middle lobe consolidation.  1.6 cm possible left hepatic lesion. -MRI of the abdomen on 06/26/2019 showed normal left hepatic lobe lesion.  There is hemosiderin deposition in the liver as well as adrenals. -She completed prophylactic cranial irradiation on 07/31/2019. -She is having increased headaches pain and nausea since her last treatment. -Labs done on 12/22/2019 showed hemoglobin 11.7, WBC 4.8, platelets 571, magnesium 1.8, potassium 3.9, creatinine 0.82 -We will give her 1 L of normal saline along with nausea and pain medicine today. -We will see her back with her next treatment.  2.  Essential thrombocytosis: -Hydroxyurea on hold since chemotherapy for lung cancer. -Platelet count today is 571.     3.  Macrocytic anemia: -This has resolved.  Hemoglobin today is 11.7.

## 2019-12-23 ENCOUNTER — Ambulatory Visit (HOSPITAL_COMMUNITY): Admission: RE | Admit: 2019-12-23 | Payer: Medicare Other | Source: Ambulatory Visit

## 2019-12-28 DIAGNOSIS — Z923 Personal history of irradiation: Secondary | ICD-10-CM | POA: Insufficient documentation

## 2019-12-28 DIAGNOSIS — H9190 Unspecified hearing loss, unspecified ear: Secondary | ICD-10-CM | POA: Insufficient documentation

## 2020-01-01 ENCOUNTER — Other Ambulatory Visit (HOSPITAL_COMMUNITY): Payer: Self-pay | Admitting: Nurse Practitioner

## 2020-01-05 ENCOUNTER — Inpatient Hospital Stay (HOSPITAL_COMMUNITY): Payer: Medicare Other

## 2020-01-05 ENCOUNTER — Other Ambulatory Visit: Payer: Self-pay

## 2020-01-05 ENCOUNTER — Inpatient Hospital Stay (HOSPITAL_BASED_OUTPATIENT_CLINIC_OR_DEPARTMENT_OTHER): Payer: Medicare Other | Admitting: Hematology

## 2020-01-05 VITALS — BP 129/73 | HR 66 | Temp 97.4°F | Resp 16

## 2020-01-05 VITALS — BP 140/81 | HR 74 | Temp 96.8°F | Resp 17 | Wt 97.8 lb

## 2020-01-05 DIAGNOSIS — C3491 Malignant neoplasm of unspecified part of right bronchus or lung: Secondary | ICD-10-CM

## 2020-01-05 DIAGNOSIS — Z5111 Encounter for antineoplastic chemotherapy: Secondary | ICD-10-CM | POA: Diagnosis not present

## 2020-01-05 LAB — COMPREHENSIVE METABOLIC PANEL WITH GFR
ALT: 12 U/L (ref 0–44)
AST: 20 U/L (ref 15–41)
Albumin: 3.2 g/dL — ABNORMAL LOW (ref 3.5–5.0)
Alkaline Phosphatase: 69 U/L (ref 38–126)
Anion gap: 9 (ref 5–15)
BUN: 14 mg/dL (ref 8–23)
CO2: 23 mmol/L (ref 22–32)
Calcium: 8.8 mg/dL — ABNORMAL LOW (ref 8.9–10.3)
Chloride: 104 mmol/L (ref 98–111)
Creatinine, Ser: 0.83 mg/dL (ref 0.44–1.00)
GFR calc Af Amer: >60 mL/min
GFR calc non Af Amer: >60 mL/min
Glucose, Bld: 117 mg/dL — ABNORMAL HIGH (ref 70–99)
Potassium: 3.6 mmol/L (ref 3.5–5.1)
Sodium: 136 mmol/L (ref 135–145)
Total Bilirubin: 0.5 mg/dL (ref 0.3–1.2)
Total Protein: 6.4 g/dL — ABNORMAL LOW (ref 6.5–8.1)

## 2020-01-05 LAB — CBC WITH DIFFERENTIAL/PLATELET
Abs Immature Granulocytes: 0.16 10*3/uL — ABNORMAL HIGH (ref 0.00–0.07)
Basophils Absolute: 0.1 10*3/uL (ref 0.0–0.1)
Basophils Relative: 1 %
Eosinophils Absolute: 0.1 10*3/uL (ref 0.0–0.5)
Eosinophils Relative: 0 %
HCT: 35 % — ABNORMAL LOW (ref 36.0–46.0)
Hemoglobin: 11 g/dL — ABNORMAL LOW (ref 12.0–15.0)
Immature Granulocytes: 1 %
Lymphocytes Relative: 9 %
Lymphs Abs: 1.1 10*3/uL (ref 0.7–4.0)
MCH: 28.5 pg (ref 26.0–34.0)
MCHC: 31.4 g/dL (ref 30.0–36.0)
MCV: 90.7 fL (ref 80.0–100.0)
Monocytes Absolute: 0.8 10*3/uL (ref 0.1–1.0)
Monocytes Relative: 6 %
Neutro Abs: 11.1 10*3/uL — ABNORMAL HIGH (ref 1.7–7.7)
Neutrophils Relative %: 83 %
Platelets: 706 10*3/uL — ABNORMAL HIGH (ref 150–400)
RBC: 3.86 MIL/uL — ABNORMAL LOW (ref 3.87–5.11)
RDW: 16.4 % — ABNORMAL HIGH (ref 11.5–15.5)
WBC: 13.3 10*3/uL — ABNORMAL HIGH (ref 4.0–10.5)
nRBC: 0 % (ref 0.0–0.2)

## 2020-01-05 LAB — MAGNESIUM: Magnesium: 1.7 mg/dL (ref 1.7–2.4)

## 2020-01-05 MED ORDER — SODIUM CHLORIDE 0.9 % IV SOLN
10.0000 mg | Freq: Once | INTRAVENOUS | Status: AC
  Administered 2020-01-05: 10 mg via INTRAVENOUS
  Filled 2020-01-05: qty 10

## 2020-01-05 MED ORDER — SODIUM CHLORIDE 0.9 % IV SOLN: Freq: Once | INTRAVENOUS | Status: AC

## 2020-01-05 MED ORDER — SODIUM CHLORIDE 0.9% FLUSH
10.0000 mL | INTRAVENOUS | Status: DC | PRN
  Administered 2020-01-05: 10 mL

## 2020-01-05 MED ORDER — SODIUM CHLORIDE 0.9 % IV SOLN
3.2000 mg/m2 | Freq: Once | INTRAVENOUS | Status: AC
  Administered 2020-01-05: 4.5 mg via INTRAVENOUS
  Filled 2020-01-05: qty 9

## 2020-01-05 MED ORDER — HEPARIN SOD (PORK) LOCK FLUSH 100 UNIT/ML IV SOLN
500.0000 [IU] | Freq: Once | INTRAVENOUS | Status: AC | PRN
  Administered 2020-01-05: 500 [IU]

## 2020-01-05 MED ORDER — PALONOSETRON HCL INJECTION 0.25 MG/5ML
0.2500 mg | Freq: Once | INTRAVENOUS | Status: AC
  Administered 2020-01-05: 0.25 mg via INTRAVENOUS
  Filled 2020-01-05: qty 5

## 2020-01-05 NOTE — Patient Instructions (Signed)
[facility] at [facility] Discharge Instructions  You were seen today by Dr. Katragadda. He went over your recent results. You received your treatment today. Dr. Katragadda will see you back in 3 weeks for labs and follow up.   Thank you for choosing [facility] at [facility] to provide your oncology and hematology care.  To afford each patient quality time with our provider, please arrive at least 15 minutes before your scheduled appointment time.   If you have a lab appointment with the Cancer Center please come in thru the Main Entrance and check in at the main information desk  You need to re-schedule your appointment should you arrive 10 or more minutes late.  We strive to give you quality time with our providers, and arriving late affects you and other patients whose appointments are after yours.  Also, if you no show three or more times for appointments you may be dismissed from the clinic at the providers discretion.     Again, thank you for choosing [facility].  Our hope is that these requests will decrease the amount of time that you wait before being seen by our physicians.       _____________________________________________________________  Should you have questions after your visit to [facility], please contact our office at [phone] between the hours of 8:00 a.m. and 4:30 p.m.  Voicemails left after 4:00 p.m. will not be returned until the following business day.  For prescription refill requests, have your pharmacy contact our office and allow 72 hours.    Cancer Center Support Programs:   > Cancer Support Group  2nd Tuesday of the month 1pm-2pm, Journey Room    

## 2020-01-05 NOTE — Patient Instructions (Signed)
[facility] Discharge Instructions for Patients Receiving Chemotherapy   Beginning January 23rd 2017 lab work for the [facility] will be done in the  Main lab at [facility] on 1st floor. If you have a lab appointment with the [facility] please come in thru the  Main Entrance and check in at the main information desk   Today you received the following chemotherapy agents Zepzelca  To help prevent nausea and vomiting after your treatment, we encourage you to take your nausea medication  If you develop nausea and vomiting, or diarrhea that is not controlled by your medication, call the clinic.  The clinic phone number is [phone]. Office hours are Monday-Friday 8:30am-5:00pm.  BELOW ARE SYMPTOMS THAT SHOULD BE REPORTED IMMEDIATELY:  *FEVER GREATER THAN 101.0 F  *CHILLS WITH OR WITHOUT FEVER  NAUSEA AND VOMITING THAT IS NOT CONTROLLED WITH YOUR NAUSEA MEDICATION  *UNUSUAL SHORTNESS OF BREATH  *UNUSUAL BRUISING OR BLEEDING  TENDERNESS IN MOUTH AND THROAT WITH OR WITHOUT PRESENCE OF ULCERS  *URINARY PROBLEMS  *BOWEL PROBLEMS  UNUSUAL RASH Items with * indicate a potential emergency and should be followed up as soon as possible. If you have an emergency after office hours please contact your primary care physician or go to the nearest emergency department.  Please call the clinic during office hours if you have any questions or concerns.   You may also contact the Patient Navigator at [phone] should you have any questions or need assistance in obtaining follow up care.      Resources For Cancer Patients and their Caregivers ? American Cancer Society: Can assist with transportation, wigs, general needs, runs Look Good Feel Better.        602-788-9222 ? Cancer Care: Provides financial assistance, online support groups, medication/co-pay assistance.  1-800-813-HOPE 617-635-2198) ? [place] Assists [place] cancer  patients and their families through emotional , educational and financial support.  873-085-7001 ? [place] DSS Where to apply for food stamps, Medicaid and utility assistance. 380-874-5513 ? [employer]: Transportation to medical appointments. 709-092-9876 ? Social Security Administration: May apply for disability if have a Stage IV cancer. 904-788-2229 707-463-7131 ? [employer], Disability and Transit Services: Assists with nutrition, care and transit needs. 236-758-2927

## 2020-01-05 NOTE — Progress Notes (Signed)
Teeghan Hammer Puthoff presents today for [employer]. Pt denies any new changes or symptoms since last treatment. Lab results and vitals have been reviewed and are stable and within parameters for treatment. Patient has been assessed by Dr. Delton Coombes who has approved proceeding with treatment today as planned. Per MD, full dose to be given today.  Infusions tolerated without incident or complaint. VSS upon completion of treatment. Port flushed and deaccessed per protocol, see MAR and IV flowsheet for details. Discharged in satisfactory condition with follow up instructions.

## 2020-01-05 NOTE — Progress Notes (Signed)
[place] [place], [place] [address]  CLINIC:  Medical Oncology/Hematology  PCP:  [name], NP [address] / [place] [address] [phone]   REASON FOR VISIT:  Follow-up for right small cell lung cancer  PRIOR THERAPY:  1. Right upper lobectomy on 12/30/2018. 2. 30 Gy of radiation in 10 fractions from 01/28/2019 through 02/10/2019. 3. Carboplatin and etoposide x 4 cycles from 03/09/2019 through 05/11/2019.  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 The 3 liver lesions are not hypermetabolic. The radiologist suspects they may be subtly present on prior CT chest from 10/2013, to further reassuring that these are likely benign lesions.   Melanoma ([place])  07/09/2013 Initial Biopsy   Initial biopsy L upper thigh/buttocks melanoma   07/20/2013 Surgery   Excision L lateral buttock/upper thigh melanoma, clear margins   07/20/2013 Pathology Results   Breslow depth 1.02 mm, pT2a    Small cell lung carcinoma, right ([place])  11/27/2018 Initial Diagnosis   Small cell lung carcinoma, right ([place])   03/09/2019 - 05/15/2019 Chemotherapy   The patient had palonosetron (ALOXI) injection 0.25 mg, 0.25 mg, Intravenous,  Once, 4 of 4 cycles Administration: 0.25 mg (03/09/2019), 0.25 mg (03/30/2019), 0.25 mg (04/20/2019), 0.25 mg (05/11/2019) pegfilgrastim (NEULASTA ONPRO KIT) injection 6 mg, 6  mg, Subcutaneous, Once, 1 of 1 cycle Administration: 6 mg (03/11/2019) pegfilgrastim-jmdb (FULPHILA) injection 6 mg, 6 mg, Subcutaneous,  Once, 3 of 3 cycles Administration: 6 mg (04/03/2019), 6 mg (04/24/2019), 6 mg (05/15/2019) CARBOplatin (PARAPLATIN) 330 mg in sodium chloride 0.9 % 250 mL chemo infusion, 330 mg (100 % of original dose 325.5 mg), Intravenous,  Once, 4 of 4 cycles Dose modification:   (original dose 325.5 mg, Cycle 1),   (original dose 325.5 mg, Cycle 2),   (original dose 309 mg, Cycle 3),   (original dose 325.5 mg, Cycle 4) Administration: 330 mg (03/09/2019), 330 mg (03/30/2019), 310 mg (04/20/2019), 330 mg (05/11/2019) etoposide (VEPESID) 150 mg in sodium chloride 0.9 % 500 mL chemo infusion, 100 mg/m2 = 150 mg, Intravenous,  Once, 4 of 4 cycles Administration: 150 mg (03/09/2019), 150 mg (03/10/2019), 150 mg (03/11/2019), 150 mg (03/30/2019), 150 mg (03/31/2019), 150 mg (04/01/2019), 150 mg (04/20/2019), 150 mg (04/21/2019), 150 mg (04/22/2019), 150 mg (05/11/2019), 150 mg (05/12/2019), 150 mg (05/13/2019) fosaprepitant (EMEND) 150 mg, dexamethasone (DECADRON) 12 mg in sodium chloride 0.9 % 145 mL IVPB, , Intravenous,  Once, 4 of 4 cycles Administration:  (03/09/2019),  (03/30/2019),  (04/20/2019),  (05/11/2019)  for chemotherapy treatment.    03/09/2019 Cancer Staging   Staging form: Lung, AJCC 8th Edition - Clinical: Stage IIIA (cT3, cN1, cM0) - Signed by Derek Jack, MD on 03/09/2019   12/15/2019 -  Chemotherapy   The patient had palonosetron (ALOXI) injection 0.25 mg, 0.25 mg, Intravenous,  Once, 1 of 4 cycles Administration: 0.25 mg (  12/15/2019) lurbinectedin (ZEPZELCA) 3.65 mg in sodium chloride 0.9 % 250 mL chemo infusion, 2.6 mg/m2 = 3.65 mg (81.3 % of original dose 3.2 mg/m2), Intravenous,  Once, 1 of 4 cycles Dose modification: 2.6 mg/m2 (original dose 3.2 mg/m2, Cycle 1, Reason: Provider Judgment) Administration: 3.65 mg (12/15/2019)  for chemotherapy treatment.       CANCER STAGING: Cancer Staging Small cell lung carcinoma, right (HCC) Staging form: Lung, AJCC 8th Edition - Clinical: Stage IIIA (cT3, cN1, cM0) - Signed by Derek Jack, MD on 03/09/2019   INTERVAL HISTORY:  Ms. CHAITRA MAST, a 69 y.o. female, returns for routine follow-up and consideration for next cycle of chemotherapy. Verble was last seen on 12/15/2019.  Due for cycle #2 of lurbinectedin and Aloxi today.   Today she is accompanied by her daughter. Overall, she tells me she has been feeling fine. She reports that after finishing the previous treatment, she had worsening pain in her RUQ for a week, but then it eased up. She takes oxycodone 5 mg TID and tramadol QD in the morning and sometimes 1 tablet of ibuprofen. She also has nausea and constipation, even though she takes stool softener. She was also very fatigued the week after the last treatment and slept daily.  Overall, she feels ready for next cycle of chemo today.    REVIEW OF SYSTEMS:  Review of Systems  Constitutional: Positive for appetite change (severely decreased) and fatigue (severe).  HENT:   Positive for trouble swallowing.   Cardiovascular: Positive for chest pain.  Gastrointestinal: Positive for constipation and nausea.  Musculoskeletal: Positive for back pain (6/10 L mid back pain).  Neurological: Positive for dizziness, headaches and numbness (fingers & feet).  All other systems reviewed and are negative.   PAST MEDICAL/SURGICAL HISTORY:  Past Medical History:  Diagnosis Date  . 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 Anginal pain ([facility])   . Arthritis   . Ascending aortic aneurysm ([place])   . Cancer [facility]) 1995   breast, left, mastectomy/chemo  . Chest pain    Possibly cardiac. No evidence of ischemia/injury based upon normal troponin I. Chest discomfort could be tachycardia induced supply demand mismatch.   . CHF (congestive heart failure) ([place] [place]) 11/17/2015   after surgery   . Colon  adenomas   . Coronary artery disease   . DJD (degenerative joint disease)   . Dyspnea    with exertion  . Emphysema of lung ([place]) 11/17/2015  . Essential hypertension, benign   . GERD (gastroesophageal reflux disease)   . History of hiatal hernia   . Hyperlipidemia   . Hypertension   . Melanoma ([facility]) 01/09/2016  . Osteopenia   . Palpitations   . Pernicious anemia 03/06/2016  . Pernicious anemia   . Pure hypercholesterolemia   . Raynaud's disease   . Thrombocythemia, essential ([place]) 01/09/2016  . Thrombocytosis (H[facility])    Idiopathic  . Vitamin D deficiency    Past Surgical History:  Procedure Laterality Date  . ABDOMINAL HYSTERECTOMY    . ANTERIOR AND POSTERIOR REPAIR N/A 12/09/2014   Procedure: ANTERIOR (CYSTOCELE) AND POSTERIOR REPAIR (RECTOCELE);  Surgeon: Bjorn Loser, MD;  Location: [facility];  Service: Urology;  Laterality: N/A;  . ANTERIOR CERVICAL DECOMPRESSION/DISCECTOMY FUSION 4 LEVELS Right 10/03/2016   Procedure: ANTERIOR CERVICAL DECOMPRESSION FUSION, CERVICAL 4-5, CERVICAL 5-6, CERVICAL 6-7, CERVICAL 7 TO THORACIC 1 WITH INSTRUMENTATION AND ALLOGRAFT;  Surgeon: Phylliss Bob, MD;  Location: [facility];  Service: Orthopedics;  Laterality: Right;  ANTERIOR CERVICAL  DECOMPRESSION FUSION, CERVICAL 4-5, CERVICAL 5-6, CERVICAL 6-7, CERVICAL 7 TO THORACIC 1 WITH INSTRUMENTATION AND ALLOGRAFT; REQUEST 4 HO  . ANTERIOR LAT LUMBAR FUSION Left 11/16/2015   Procedure: LEFT SIDED LATERAL INTERBODY FUSION, LUMBAR 2-3, LUMBAR 3-4, LUMBAR 4-5 WITH INSTRUMENTATION;  Surgeon: Phylliss Bob, MD;  Location: [facility];  Service: Orthopedics;  Laterality: Left;  LEFT SIDED LATEARL INTERBODY FUSION, LUMBAR 2-3, LUMBAR 3-4, LUMBAR 4-5 WITH INSTRUMENTATION   . APPENDECTOMY    . BACK SURGERY    . BONE MARROW ASPIRATION  07/2012  . BONE MARROW BIOPSY  07/2012  . BREAST SURGERY    . CARDIAC CATHETERIZATION    . CARDIAC CATHETERIZATION N/A 01/20/2016   Procedure: Left Heart Cath and Coronary Angiography;   Surgeon: Burnell Blanks, MD;  Location: [facility];  Service: Cardiovascular;  Laterality: N/A;  . COLONOSCOPY  11/29/2010   Procedure: COLONOSCOPY;  Surgeon: Rogene Houston, MD;  [facility];  Service: Endoscopy;  Laterality: N/A;  . COLONOSCOPY N/A 02/18/2014   Procedure: COLONOSCOPY;  Surgeon: Rogene Houston, MD;  [facility];  Service: Endoscopy;  Laterality: N/A;  1030  . COLONOSCOPY N/A 02/25/2017   Procedure: COLONOSCOPY;  Surgeon: Rogene Houston, MD;  [facility];  Service: Endoscopy;  Laterality: N/A;  10:55  . CYSTOSCOPY N/A 12/09/2014   Procedure: CYSTOSCOPY;  Surgeon: Bjorn Loser, MD;  Location: [facility];  Service: Urology;  Laterality: N/A;  . ESOPHAGEAL DILATION N/A 02/25/2017   Procedure: ESOPHAGEAL DILATION;  Surgeon: Rogene Houston, MD;  [facility];  Service: Endoscopy;  Laterality: N/A;  . ESOPHAGOGASTRODUODENOSCOPY N/A 02/25/2017   Procedure: ESOPHAGOGASTRODUODENOSCOPY (EGD);  Surgeon: Rogene Houston, MD;  [facility];  Service: Endoscopy;  Laterality: N/A;  . IR GENERIC HISTORICAL  01/11/2016   IR RADIOLOGY PERIPHERAL GUIDED IV START 01/11/2016 Saverio Danker, PA-C [facility]  . IR GENERIC HISTORICAL  01/11/2016   IR US GUIDE VASC ACCESS RIGHT 01/11/2016 Saverio Danker, PA-C [facility]  . LYMPH NODE DISSECTION Right 12/30/2018   Procedure: Lymph Node Dissection;  Surgeon: Lajuana Matte, MD;  Location: [facility];  Service: Thoracic;  Laterality: Right;  . MASTECTOMY     left  . OVARIAN CYST SURGERY     x2  . POLYPECTOMY  02/25/2017   Procedure: POLYPECTOMY;  Surgeon: Rogene Houston, MD;  [facility];  Service: Endoscopy;;  . PORTACATH PLACEMENT Left 02/16/2019   Procedure: INSERTION PORT-A-CATH (CATHETER  LEFT SUBCLAVIAN);  Surgeon: Aviva Signs, MD;  Location: [facility];  Service: General;  Laterality: Left;  . SALPINGOOPHORECTOMY Bilateral 12/09/2014   Procedure: SALPINGO  OOPHORECTOMY;  Surgeon: Servando Salina, MD;  Location: [facility];  Service: Gynecology;  Laterality: Bilateral;  . TUBAL LIGATION    . VAGINAL HYSTERECTOMY N/A 12/09/2014   Procedure: HYSTERECTOMY VAGINAL;  Surgeon: Servando Salina, MD;  Location: [facility];  Service: Gynecology;  Laterality: N/A;  . VIDEO ASSISTED THORACOSCOPY (VATS)/ LOBECTOMY Right 12/30/2018   Procedure: VIDEO ASSISTED THORACOSCOPY (VATS)/RIGHT LOWER LOBE WEDGE RESECTION, RIGHT UPPER LOBECTOMY;  Surgeon: Lajuana Matte, MD;  Location: [facility];  Service: Thoracic;  Laterality: Right;  Marland Kitchen VIDEO BRONCHOSCOPY N/A 12/30/2018   Procedure: VIDEO BRONCHOSCOPY;  Surgeon: Lajuana Matte, MD;  [facility];  Service: Thoracic;  Laterality: N/A;    SOCIAL HISTORY:  Social History   Socioeconomic History  . Marital status: Divorced    Spouse name: Not on file  . Number  of children: 2  . Years of education: Not on file  . Highest education level: Not on file  Occupational History  . Not on file  Tobacco Use  . Smoking status: Former Smoker    Packs/day: 0.50    Years: 50.00    Pack years: 25.00    Types: Cigarettes    Quit date: 11/25/2018    Years since quitting: 1.1  . Smokeless tobacco: Never Used  Vaping Use  . Vaping Use: Never used  Substance and Sexual Activity  . Alcohol use: Yes    Comment: occasional  . Drug use: No  . Sexual activity: Yes    Birth control/protection: Post-menopausal  Other Topics Concern  . Not on file  Social History Narrative  . Not on file   Social Determinants of Health   Financial Resource Strain:   . Difficulty of Paying Living Expenses: Not on file  Food Insecurity:   . Worried About Charity fundraiser in the Last Year: Not on file  . Ran Out of Food in the Last Year: Not on file  Transportation Needs:   . Lack of Transportation (Medical): Not on file  . Lack of Transportation (Non-Medical): Not on file  Physical Activity:   . Days of Exercise per Week: Not on  file  . Minutes of Exercise per Session: Not on file  Stress:   . Feeling of Stress : Not on file  Social Connections:   . Frequency of Communication with Friends and Family: Not on file  . Frequency of Social Gatherings with Friends and Family: Not on file  . Attends Religious Services: Not on file  . Active Member of Clubs or Organizations: Not on file  . Attends Archivist Meetings: Not on file  . Marital Status: Not on file  Intimate Partner Violence:   . Fear of Current or Ex-Partner: Not on file  . Emotionally Abused: Not on file  . Physically Abused: Not on file  . Sexually Abused: Not on file    FAMILY HISTORY:  Family History  Problem Relation Age of Onset  . Hypertension Mother   . Heart failure Mother   . Congestive Heart Failure Mother   . COPD Mother   . Pernicious anemia Mother   . Cancer Mother        lung  . Hypertension Father   . CAD Father   . Heart attack Father   . Hypertension Sister   . Cancer Other   . Celiac disease Other     CURRENT MEDICATIONS:  Current Outpatient Medications  Medication Sig Dispense Refill  . albuterol (PROVENTIL HFA;VENTOLIN HFA) 108 (90 Base) MCG/ACT inhaler Inhale 2 puffs into the lungs every 6 (six) hours as needed for wheezing or shortness of breath. 1 Inhaler 2  . albuterol (PROVENTIL) (2.5 MG/3ML) 0.083% nebulizer solution Take 2.5 mg by nebulization every 6 (six) hours.    Marland Kitchen aspirin EC 81 MG tablet Take 81 mg by mouth daily.    . Calcium Carb-Cholecalciferol (CALCIUM 600+D) 600-800 MG-UNIT TABS Take 1 tablet by mouth 2 (two) times daily.    . Cholecalciferol (VITAMIN D-3) 1000 units CAPS Take 1,000 Units daily by mouth.     . clonazePAM (KLONOPIN) 0.5 MG tablet TAKE ONE TABLET BY MOUTH AT BEDTIME. 90 tablet 0  . cyanocobalamin (,VITAMIN B-12,) 1000 MCG/ML injection INJECT 1 ML INTO THE MUSCLE ONCE MONTHLY AS DIRECTED. (Patient taking differently: Inject 1,000 mcg into the muscle every 30 (thirty)  days. ) 1 mL  5  . dexamethasone (DECADRON) 2 MG tablet Take 1 tablet (2 mg total) by mouth daily. (Patient taking differently: Take 2 mg by mouth daily. Taking 1/2 tablet) 30 tablet 3  . dronabinol (MARINOL) 2.5 MG capsule Take 1 capsule (2.5 mg total) by mouth 2 (two) times daily before a meal. 60 capsule 3  . ergocalciferol (VITAMIN D2) 1.25 MG (50000 UT) capsule Take by mouth.    . gabapentin (NEURONTIN) 600 MG tablet Take 1 tablet (600 mg total) by mouth daily. 30 tablet 6  . guaiFENesin (MUCINEX) 600 MG 12 hr tablet Take 1 tablet (600 mg total) by mouth 2 (two) times daily as needed for cough or to loosen phlegm.    . ibandronate (BONIVA) 150 MG tablet Take 150 mg by mouth every 30 (thirty) days.     Marland Kitchen ibuprofen (ADVIL,MOTRIN) 800 MG tablet Take 800 mg by mouth every 8 (eight) hours as needed for moderate pain.     Marland Kitchen ipratropium (ATROVENT) 0.02 % nebulizer solution Take by nebulization.    Marland Kitchen ipratropium-albuterol (DUONEB) 0.5-2.5 (3) MG/3ML SOLN Take 3 mLs by nebulization every 6 (six) hours as needed (shortness of breath). 360 mL 0  . lidocaine-prilocaine (EMLA) cream Apply a small amount to port a cath site and cover with plastic wrap 1 hour prior to chemotherapy appointments 30 g 3  . methocarbamol (ROBAXIN) 500 MG tablet Take 500 mg by mouth every 6 (six) hours as needed for muscle spasms.     . nitroGLYCERIN (NITRODUR - DOSED IN MG/24 HR) 0.1 mg/hr patch Place 0.1 mg onto the skin daily.     . ondansetron (ZOFRAN) 8 MG tablet Take 1 tablet (8 mg total) by mouth every 8 (eight) hours as needed for refractory nausea / vomiting. Start on day 3 after chemotherapy. 30 tablet 1  . oxyCODONE (OXY IR/ROXICODONE) 5 MG immediate release tablet Take 1 tablet (5 mg total) by mouth every 8 (eight) hours. 90 tablet 0  . OXYGEN Inhale 3 L into the lungs daily.    . pantoprazole (PROTONIX) 40 MG tablet TAKE ONE TABLET BY MOUTH TWICE DAILY. 60 tablet 0  . polyethylene glycol (MIRALAX / GLYCOLAX) 17 g packet Take 17 g  by mouth daily as needed for mild constipation.    . potassium chloride SA (KLOR-CON) 20 MEQ tablet Take 1 tablet (20 mEq total) by mouth 3 (three) times daily. (Patient taking differently: Take 20 mEq by mouth daily. ) 60 tablet 2  . rizatriptan (MAXALT) 10 MG tablet Take 10 mg by mouth 2 (two) times daily. 1-2 times daily May repeat in 2 hours if needed    . rosuvastatin (CRESTOR) 40 MG tablet Take 40 mg at bedtime by mouth.     . traMADol (ULTRAM) 50 MG tablet Take 50 mg by mouth every 6 (six) hours as needed.    . triamcinolone cream (KENALOG) 0.1 %      No current facility-administered medications for this visit.   Facility-Administered Medications Ordered in Other Visits  Medication Dose Route Frequency Provider Last Rate Last Admin  . sodium chloride flush (NS) 0.9 % injection 10 mL  10 mL Intravenous PRN Lockamy, Randi L, NP-C   10 mL at 12/22/19 1240    ALLERGIES:  Allergies  Allergen Reactions  . Penicillins Hives and Rash    Did it involve swelling of the face/tongue/throat, SOB, or low BP? No Did it involve sudden or severe rash/hives, skin peeling, or any reaction  on the inside of your mouth or nose? No Did you need to seek medical attention at a hospital or doctor's office? No When did it last happen?5-10 year If all above answers are "NO", may proceed with cephalosporin use.    . Tape Rash    Medipore, Coban, and paper tape CAN be tolerated    PHYSICAL EXAM:  Performance status (ECOG): 1 - Symptomatic but completely ambulatory  Vitals:   01/05/20 1209  BP: 140/81  Pulse: 74  Resp: 17  Temp: (!) 96.8 F (36 C)  SpO2: 100%   Wt Readings from Last 3 Encounters:  01/05/20 97 lb 12.8 oz (44.4 kg)  12/22/19 98 lb 1.6 oz (44.5 kg)  12/15/19 94 lb 9.2 oz (42.9 kg)   Physical Exam Vitals reviewed.  Constitutional:      Appearance: Normal appearance.  Cardiovascular:     Rate and Rhythm: Normal rate and regular rhythm.     Pulses: Normal pulses.      Heart sounds: Normal heart sounds.  Pulmonary:     Effort: Pulmonary effort is normal.     Breath sounds: Normal breath sounds.  Chest:     Comments: Port-a-Cath in L chest Abdominal:     Palpations: Abdomen is soft. There is no mass.     Tenderness: There is no abdominal tenderness.  Neurological:     General: No focal deficit present.     Mental Status: She is alert and oriented to person, place, and time.  Psychiatric:        Mood and Affect: Mood normal.        Behavior: Behavior normal.     LABORATORY DATA:  I have reviewed the labs as listed.  CBC Latest Ref Rng & Units 01/05/2020 12/22/2019 12/15/2019  WBC 4.0 - 10.5 K/uL 13.3(H) 4.8 18.8(H)  Hemoglobin 12.0 - 15.0 g/dL 11.0(L) 11.7(L) 10.3(L)  Hematocrit 36 - 46 % 35.0(L) 36.7 32.3(L)  Platelets 150 - 400 K/uL 706(H) 571(H) 574(H)   CMP Latest Ref Rng & Units 01/05/2020 12/22/2019 12/15/2019  Glucose 70 - 99 mg/dL 117(H) 98 191(H)  BUN 8 - 23 mg/dL _0 Creatinine 0.44 - 1.00 mg/dL 0.83 0.82 1.33(H)  Sodium 135 - 145 mmol/L 136 134(L) 131(L)  Potassium 3.5 - 5.1 mmol/L 3.6 3.9 3.2(L)  Chloride 98 - 111 mmol/L 104 100 100  CO2 22 - 32 mmol/L 23 25 21(L)  Calcium 8.9 - 10.3 mg/dL 8.8(L) 8.2(L) 8.5(L)  Total Protein 6.5 - 8.1 g/dL 6.4(L) 6.2(L) 7.0  Total Bilirubin 0.3 - 1.2 mg/dL 0.5 0.7 0.7  Alkaline Phos 38 - 126 U/L 69 67 80  AST 15 - 41 U/L _1 ALT 0 - 44 U/L _2 DIAGNOSTIC IMAGING:  I have independently reviewed the scans and discussed with the patient. No results found.   ASSESSMENT:  1. 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 -30 Gy of radiation in 10 fractions from 01/28/2019 through 02/10/2019. -4 cycles of carboplatin and etoposide from  03/09/2019 through 05/11/2019. -PCI completed on 07/31/2019. -We reviewed MRI of the brain on 09/15/2019 with no evidence of brain meta stasis. Concern for hypointense appearance at the C3 vertebral body and left articular process. -CT chest on 09/15/2019 shows numerous  new small solid irregular pulmonary nodules scattered throughout both lungs, largest 7 mm on the right lower lobe. Tiny loculated anterior right pleural effusion decreased. Stable mild subcarinal adenopathy. Bilateral stable adrenal adenomas. -MRI of the C-spine on 10/13/2019 showed marrow edema and enhancement involving C3 vertebral body and left articular process without discrete bone lesion. This was thought to be degenerative. -PET scan on 09/28/2019 shows bilateral lung nodules, below PET resolution. Small right-sided nodular densities demonstrating low-level hypermetabolism. Mild hypermetabolic corresponding to a normal-sized subcarinal lymph node. Multifocal right pleural hypermetabolism in the setting of pleural thickening and prior right upper lobectomy, indeterminate. No hypermetabolic extrathoracic disease. -PET scan on 11/24/2019 shows progressive increased soft tissue within the right lung along the suture margins, positive right paratracheal lymph node, small subcapsular focus of increased radiotracer uptake overlying the anterolateral right hepatic lobe concerning for tumor.   PLAN:  1. Stage III (T3N1) small cell lung cancer: -She has tolerated first cycle very well.  She had some improvement in pain after the first week after receiving chemotherapy. -I will increase the dose of Lurbi to regular dose. -She will come back in 3 weeks for follow-up.  Plan to repeat scans after cycle 3.  2. Essential thrombocytosis: -Hydrea on hold since chemotherapy started for lung cancer.  Platelet count is 706 today.  We will closely monitor.  3. Macrocytic anemia: -Hemoglobin is 11.  Macrocytosis resolved.  4. Weight  loss: -She is taking dexamethasone 1 mg in the morning.  She is able to eat better.  She gained 2 pounds.  5.  Mid back and sternal pain: -She is taking tramadol in the morning followed by oxycodone in the afternoon and 2 tablets of oxycodone at bedtime.  She also takes ibuprofen once.  This is controlling her pain.  6.  Hypokalemia: -Continue potassium 20 mg daily.   Orders placed this encounter:  No orders of the defined types were placed in this encounter.    Derek Jack, MD [place] [phone]   I, Milinda Antis, am acting as a scribe for Dr. Sanda Linger.  I, Derek Jack MD, have reviewed the above documentation for accuracy and completeness, and I agree with the above.

## 2020-01-05 NOTE — Progress Notes (Signed)
Confirmed with MD new dose today of 3.2 mg/m2  T.O. Dr Jim Desanctis, RN/Evony Rezek Ronnald Ramp, PharmD

## 2020-01-05 NOTE — Progress Notes (Signed)
Patient was assessed by Dr. Delton Coombes and labs have been reviewed.  We are increasing dose of zepzelca today.  Patient is okay to proceed with treatment today. Primary RN and pharmacy aware.

## 2020-01-15 ENCOUNTER — Other Ambulatory Visit (HOSPITAL_COMMUNITY): Payer: Self-pay | Admitting: Hematology

## 2020-01-18 ENCOUNTER — Other Ambulatory Visit (HOSPITAL_COMMUNITY): Payer: Self-pay | Admitting: Surgery

## 2020-01-18 MED ORDER — OXYCODONE HCL 5 MG PO TABS
5.0000 mg | ORAL_TABLET | Freq: Three times a day (TID) | ORAL | 0 refills | Status: DC
Start: 2020-01-18 — End: 2020-02-22

## 2020-01-22 ENCOUNTER — Telehealth (HOSPITAL_COMMUNITY): Payer: Self-pay

## 2020-01-22 NOTE — Telephone Encounter (Signed)
Nutrition Assessment   Reason for Assessment:  Patient identified on Malnutrition Screening report for poor appetite and weight loss   ASSESSMENT:  69 year old female with lung cancer.  Past medical history of right upper lobetcomy 12/30/18, left breast cancer, left mastectomy, CHF, GERD, CAD, HLD, HTN,  Vit D deficency.  Patient is receiving chemotherapy.   Spoke with patient via phone to introduce self and service at [facility].  Patient reports that her appetite is not good.  Has to remind herself to eat.  Reports that she does not eat meat. Has been turned off by texture and taste.  Eats cream of wheat, pasta and vegetables, cream of chicken soup, beans, peanut butter, dairy products, avocado, eggs.  Drinks ensure clear about 2-3 times per week but does not really like it.  Does not like "milky" shakes.     Medications: decadron, vit B 12, protonix   Labs: reviewed   Anthropometrics:   Height: 62 inches Weight: 97 lb BMI: 17 8/31 94 lb  08/2019 103  6% weight loss in the last 5 months   Estimated Energy Needs  Kcals: 1320-1500 Protein: 66-75 g Fluid: > 1.3 L   NUTRITION DIAGNOSIS: Inadequate oral intake related to cancer and cancer related treatment side effects as evidenced by    INTERVENTION:  Discussed ways to increase calories and protein in current eating pattern.  Will mail handout Encouraged oral nutrition supplements as able.  Will mail coupons.  Contact information provided   MONITORING, EVALUATION, GOAL: weight trends, intake   Next Visit: November 5 phone f/u  Jarod Bozzo B. Zenia Resides, [place], [place] Registered Dietitian [phone] (mobile)      .

## 2020-01-26 ENCOUNTER — Other Ambulatory Visit: Payer: Self-pay

## 2020-01-26 ENCOUNTER — Encounter (HOSPITAL_COMMUNITY): Payer: Self-pay | Admitting: Hematology

## 2020-01-26 ENCOUNTER — Inpatient Hospital Stay (HOSPITAL_COMMUNITY): Payer: Medicare Other | Attending: Hematology | Admitting: Hematology

## 2020-01-26 ENCOUNTER — Inpatient Hospital Stay (HOSPITAL_COMMUNITY): Payer: Medicare Other

## 2020-01-26 ENCOUNTER — Other Ambulatory Visit (HOSPITAL_COMMUNITY): Payer: Self-pay | Admitting: *Deleted

## 2020-01-26 VITALS — BP 123/74 | HR 69 | Resp 17

## 2020-01-26 VITALS — BP 89/62 | HR 83 | Temp 96.9°F | Resp 17 | Wt 95.4 lb

## 2020-01-26 DIAGNOSIS — Z79899 Other long term (current) drug therapy: Secondary | ICD-10-CM | POA: Insufficient documentation

## 2020-01-26 DIAGNOSIS — D473 Essential (hemorrhagic) thrombocythemia: Secondary | ICD-10-CM | POA: Insufficient documentation

## 2020-01-26 DIAGNOSIS — R634 Abnormal weight loss: Secondary | ICD-10-CM | POA: Insufficient documentation

## 2020-01-26 DIAGNOSIS — C3491 Malignant neoplasm of unspecified part of right bronchus or lung: Secondary | ICD-10-CM | POA: Diagnosis not present

## 2020-01-26 DIAGNOSIS — Z923 Personal history of irradiation: Secondary | ICD-10-CM | POA: Diagnosis not present

## 2020-01-26 DIAGNOSIS — D75839 Thrombocytosis, unspecified: Secondary | ICD-10-CM | POA: Insufficient documentation

## 2020-01-26 DIAGNOSIS — E876 Hypokalemia: Secondary | ICD-10-CM | POA: Diagnosis not present

## 2020-01-26 DIAGNOSIS — D539 Nutritional anemia, unspecified: Secondary | ICD-10-CM | POA: Diagnosis not present

## 2020-01-26 DIAGNOSIS — Z5111 Encounter for antineoplastic chemotherapy: Secondary | ICD-10-CM | POA: Diagnosis present

## 2020-01-26 DIAGNOSIS — C3411 Malignant neoplasm of upper lobe, right bronchus or lung: Secondary | ICD-10-CM | POA: Insufficient documentation

## 2020-01-26 LAB — COMPREHENSIVE METABOLIC PANEL
ALT: 14 U/L (ref 0–44)
AST: 19 U/L (ref 15–41)
Albumin: 3.2 g/dL — ABNORMAL LOW (ref 3.5–5.0)
Alkaline Phosphatase: 65 U/L (ref 38–126)
Anion gap: 9 (ref 5–15)
BUN: 18 mg/dL (ref 8–23)
CO2: 27 mmol/L (ref 22–32)
Calcium: 8.5 mg/dL — ABNORMAL LOW (ref 8.9–10.3)
Chloride: 102 mmol/L (ref 98–111)
Creatinine, Ser: 1.07 mg/dL — ABNORMAL HIGH (ref 0.44–1.00)
GFR, Estimated: 52 mL/min — ABNORMAL LOW (ref >60)
Glucose, Bld: 130 mg/dL — ABNORMAL HIGH (ref 70–99)
Potassium: 3.4 mmol/L — ABNORMAL LOW (ref 3.5–5.1)
Sodium: 138 mmol/L (ref 135–145)
Total Bilirubin: 0.5 mg/dL (ref 0.3–1.2)
Total Protein: 6.4 g/dL — ABNORMAL LOW (ref 6.5–8.1)

## 2020-01-26 LAB — CBC WITH DIFFERENTIAL/PLATELET
Abs Immature Granulocytes: 0.06 10*3/uL (ref 0.00–0.07)
Basophils Absolute: 0.1 10*3/uL (ref 0.0–0.1)
Basophils Relative: 1 %
Eosinophils Absolute: 0.5 10*3/uL (ref 0.0–0.5)
Eosinophils Relative: 5 %
HCT: 35.5 % — ABNORMAL LOW (ref 36.0–46.0)
Hemoglobin: 11.1 g/dL — ABNORMAL LOW (ref 12.0–15.0)
Immature Granulocytes: 1 %
Lymphocytes Relative: 14 %
Lymphs Abs: 1.5 10*3/uL (ref 0.7–4.0)
MCH: 27.9 pg (ref 26.0–34.0)
MCHC: 31.3 g/dL (ref 30.0–36.0)
MCV: 89.2 fL (ref 80.0–100.0)
Monocytes Absolute: 1.1 10*3/uL — ABNORMAL HIGH (ref 0.1–1.0)
Monocytes Relative: 10 %
Neutro Abs: 6.9 10*3/uL (ref 1.7–7.7)
Neutrophils Relative %: 69 %
Platelets: 605 10*3/uL — ABNORMAL HIGH (ref 150–400)
RBC: 3.98 MIL/uL (ref 3.87–5.11)
RDW: 16.6 % — ABNORMAL HIGH (ref 11.5–15.5)
WBC: 10.1 10*3/uL (ref 4.0–10.5)
nRBC: 0 % (ref 0.0–0.2)

## 2020-01-26 LAB — LACTATE DEHYDROGENASE: LDH: 119 U/L (ref 98–192)

## 2020-01-26 LAB — MAGNESIUM: Magnesium: 2 mg/dL (ref 1.7–2.4)

## 2020-01-26 MED ORDER — INFLUENZA VAC A&B SA ADJ QUAD 0.5 ML IM PRSY: 0.5000 mL | PREFILLED_SYRINGE | Freq: Once | INTRAMUSCULAR | Status: DC

## 2020-01-26 MED ORDER — HEPARIN SOD (PORK) LOCK FLUSH 100 UNIT/ML IV SOLN
500.0000 [IU] | Freq: Once | INTRAVENOUS | Status: AC | PRN
  Administered 2020-01-26: 500 [IU]

## 2020-01-26 MED ORDER — SODIUM CHLORIDE 0.9 % IV SOLN: INTRAVENOUS | Status: AC

## 2020-01-26 MED ORDER — PALONOSETRON HCL INJECTION 0.25 MG/5ML
0.2500 mg | Freq: Once | INTRAVENOUS | Status: AC
  Administered 2020-01-26: 0.25 mg via INTRAVENOUS
  Filled 2020-01-26: qty 5

## 2020-01-26 MED ORDER — SODIUM CHLORIDE 0.9% FLUSH
10.0000 mL | INTRAVENOUS | Status: DC | PRN
  Administered 2020-01-26: 10 mL

## 2020-01-26 MED ORDER — SODIUM CHLORIDE 0.9 % IV SOLN: Freq: Once | INTRAVENOUS | Status: AC

## 2020-01-26 MED ORDER — ONDANSETRON 8 MG PO TBDP: 8.0000 mg | ORAL_TABLET | Freq: Three times a day (TID) | ORAL | 2 refills | Status: DC | PRN

## 2020-01-26 MED ORDER — SODIUM CHLORIDE 0.9 % IV SOLN
10.0000 mg | Freq: Once | INTRAVENOUS | Status: AC
  Administered 2020-01-26: 10 mg via INTRAVENOUS
  Filled 2020-01-26: qty 10

## 2020-01-26 MED ORDER — SODIUM CHLORIDE 0.9 % IV SOLN
2.6000 mg/m2 | Freq: Once | INTRAVENOUS | Status: AC
  Administered 2020-01-26: 3.65 mg via INTRAVENOUS
  Filled 2020-01-26: qty 7.3

## 2020-01-26 NOTE — Progress Notes (Signed)
Order placed for flu vaccine but then pt decided that she wanted to wait another week or so.  Flu vaccine held today.

## 2020-01-26 NOTE — Progress Notes (Signed)
Patient was assessed by Dr. Delton Coombes and labs have been reviewed.  Dr. Delton Coombes gave orders for normal saline 1 liter over 2 hours.  Patient is okay to proceed with treatment today. Primary RN and pharmacy aware.

## 2020-01-26 NOTE — Progress Notes (Signed)
Patient presents today for treatment and follow up visit with Dr. Delton Coombes. Labs pending. Blood pressure on arrival 89/66. Patient has complaints of back pain she rates a 3/10. Patient is using pain medication as prescribed. MAR reviewed. Patient teaching performed pertaining to pain management and medication administration. Understanding verbalized. Patient teaching pertaining to increasing fluids throughout the day. Understanding verbalized.   Message received from [place] RN/ Dr. Delton Coombes. Proceed with treatment today. Dose reduction. Infuse 1 Liter of Normal Saline over 2 hours today with treatment. Blood pressure 89/66.   Treatment given today per MD orders. Tolerated infusion without adverse affects. Vital signs stable. No complaints at this time. Discharged from clinic via wheel chair in stable condition. Alert and oriented x 3. F/U with [facility] as scheduled.

## 2020-01-26 NOTE — Progress Notes (Signed)
[address] [place], [place] [address]   CLINIC:  Medical Oncology/Hematology  PCP:  [name], NP [address] / [place] [address] [phone]   REASON FOR VISIT:  Follow-up for right small cell lung cancer  PRIOR THERAPY:  1. Right upper lobectomy on 12/30/2018. 2. 30 Gy of radiation in 10 fractions from 01/28/2019 through 02/10/2019. 3. Carboplatin and etoposide x 4 cycles from 03/09/2019 through 05/11/2019.  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 The 3 liver lesions are not hypermetabolic. The radiologist suspects they may be subtly present on prior CT chest from 10/2013, to further reassuring that these are likely benign lesions.   Melanoma ([place])  07/09/2013 Initial Biopsy   Initial biopsy L upper thigh/buttocks melanoma   07/20/2013 Surgery   Excision L lateral buttock/upper thigh melanoma, clear margins   07/20/2013 Pathology Results   Breslow depth 1.02 mm, pT2a    Small cell lung carcinoma, right ([place])  11/27/2018 Initial Diagnosis   Small cell lung carcinoma, right ([place])   03/09/2019 - 05/15/2019 Chemotherapy   The patient had palonosetron (ALOXI) injection 0.25 mg, 0.25 mg, Intravenous,  Once, 4 of 4 cycles Administration: 0.25 mg (03/09/2019), 0.25 mg (03/30/2019), 0.25 mg (04/20/2019), 0.25 mg (05/11/2019) pegfilgrastim (NEULASTA ONPRO KIT) injection 6 mg, 6  mg, Subcutaneous, Once, 1 of 1 cycle Administration: 6 mg (03/11/2019) pegfilgrastim-jmdb (FULPHILA) injection 6 mg, 6 mg, Subcutaneous,  Once, 3 of 3 cycles Administration: 6 mg (04/03/2019), 6 mg (04/24/2019), 6 mg (05/15/2019) CARBOplatin (PARAPLATIN) 330 mg in sodium chloride 0.9 % 250 mL chemo infusion, 330 mg (100 % of original dose 325.5 mg), Intravenous,  Once, 4 of 4 cycles Dose modification:   (original dose 325.5 mg, Cycle 1),   (original dose 325.5 mg, Cycle 2),   (original dose 309 mg, Cycle 3),   (original dose 325.5 mg, Cycle 4) Administration: 330 mg (03/09/2019), 330 mg (03/30/2019), 310 mg (04/20/2019), 330 mg (05/11/2019) etoposide (VEPESID) 150 mg in sodium chloride 0.9 % 500 mL chemo infusion, 100 mg/m2 = 150 mg, Intravenous,  Once, 4 of 4 cycles Administration: 150 mg (03/09/2019), 150 mg (03/10/2019), 150 mg (03/11/2019), 150 mg (03/30/2019), 150 mg (03/31/2019), 150 mg (04/01/2019), 150 mg (04/20/2019), 150 mg (04/21/2019), 150 mg (04/22/2019), 150 mg (05/11/2019), 150 mg (05/12/2019), 150 mg (05/13/2019) fosaprepitant (EMEND) 150 mg, dexamethasone (DECADRON) 12 mg in sodium chloride 0.9 % 145 mL IVPB, , Intravenous,  Once, 4 of 4 cycles Administration:  (03/09/2019),  (03/30/2019),  (04/20/2019),  (05/11/2019)  for chemotherapy treatment.    03/09/2019 Cancer Staging   Staging form: Lung, AJCC 8th Edition - Clinical: Stage IIIA (cT3, cN1, cM0) - Signed by Derek Jack, MD on 03/09/2019   12/15/2019 -  Chemotherapy   The patient had palonosetron (ALOXI) injection 0.25 mg, 0.25 mg, Intravenous,  Once, 2 of 4 cycles Administration: 0.25 mg (  12/15/2019), 0.25 mg (01/05/2020) lurbinectedin (ZEPZELCA) 3.65 mg in sodium chloride 0.9 % 250 mL chemo infusion, 2.6 mg/m2 = 3.65 mg (81.3 % of original dose 3.2 mg/m2), Intravenous,  Once, 2 of 4 cycles Dose modification: 2.6 mg/m2 (original dose 3.2 mg/m2, Cycle 1, Reason: Provider Judgment) Administration: 3.65 mg (12/15/2019), 4.5 mg  (01/05/2020)  for chemotherapy treatment.      CANCER STAGING: Cancer Staging Small cell lung carcinoma, right (HCC) Staging form: Lung, AJCC 8th Edition - Clinical: Stage IIIA (cT3, cN1, cM0) - Signed by Derek Jack, MD on 03/09/2019   INTERVAL HISTORY:  Ashley [name], a 69 y.o. female, returns for routine follow-up and consideration for next cycle of chemotherapy. Ashley [name] was last seen on 01/05/2020.  Due for cycle #3 of lurbinectedin today.   Today she is accompanied by her daughter. Overall, she tells me she has been feeling bad. Her appetite is depleted. She had nausea after the previous treatment, but denies having V/D. She continues having pain in her lower ribs, back, and shoulder, worse when breathing deeply, lasting 1 week after the chemo. She takes 4 tablets of oxycodone daily, 1 in the morning, 1 in the afternoon and 2 before bedtime and alternates ibuprofen and tramadol in between. She eats 3 small meals throughout the day and does not drink Boost or Ensure.  Overall, she feels ready for next cycle of chemo today.    REVIEW OF SYSTEMS:  Review of Systems  Constitutional: Positive for appetite change (depleted) and fatigue (depleted).  Cardiovascular: Positive for chest pain (lower ribs for 1 week after chemo) and palpitations.  Gastrointestinal: Positive for nausea. Negative for diarrhea and vomiting.  Musculoskeletal: Positive for back pain (4/10 back, lower ribs and shoulders pain).  Neurological: Positive for numbness (hands & feet).  All other systems reviewed and are negative.   PAST MEDICAL/SURGICAL HISTORY:  Past Medical History:  Diagnosis Date  . 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 Anginal pain ([place])   . Arthritis   . Ascending aortic aneurysm ([place])   . Cancer [facility]) 1995   breast, left, mastectomy/chemo  . Chest pain    Possibly cardiac. No evidence of ischemia/injury based upon normal troponin I. Chest discomfort could be  tachycardia induced supply demand mismatch.   . CHF (congestive heart failure) ([place]) 11/17/2015   after surgery   . Colon adenomas   . Coronary artery disease   . DJD (degenerative joint disease)   . Dyspnea    with exertion  . Emphysema of lung ([place]) 11/17/2015  . Essential hypertension, benign   . GERD (gastroesophageal reflux disease)   . History of hiatal hernia   . Hyperlipidemia   . Hypertension   . Melanoma ([place]) 01/09/2016  . Osteopenia   . Palpitations   . Pernicious anemia 03/06/2016  . Pernicious anemia   . Pure hypercholesterolemia   . Raynaud's disease   . Thrombocythemia, essential ([place]) 01/09/2016  . Thrombocytosis    Idiopathic  . Vitamin D deficiency    Past Surgical History:  Procedure Laterality Date  . ABDOMINAL HYSTERECTOMY    . ANTERIOR AND POSTERIOR REPAIR N/A 12/09/2014   Procedure: ANTERIOR (CYSTOCELE) AND POSTERIOR REPAIR (RECTOCELE);  Surgeon: Bjorn Loser, MD;  Location: [facility];  Service: Urology;  Laterality: N/A;  . ANTERIOR CERVICAL DECOMPRESSION/DISCECTOMY FUSION 4 LEVELS Right 10/03/2016   Procedure: ANTERIOR CERVICAL DECOMPRESSION FUSION, CERVICAL 4-5, CERVICAL 5-6, CERVICAL 6-7, CERVICAL 7 TO THORACIC 1 WITH INSTRUMENTATION AND ALLOGRAFT;  Surgeon: Phylliss Bob,  MD;  Location: [facility];  Service: Orthopedics;  Laterality: Right;  ANTERIOR CERVICAL DECOMPRESSION FUSION, CERVICAL 4-5, CERVICAL 5-6, CERVICAL 6-7, CERVICAL 7 TO THORACIC 1 WITH INSTRUMENTATION AND ALLOGRAFT; REQUEST 4 HO  . ANTERIOR LAT LUMBAR FUSION Left 11/16/2015   Procedure: LEFT SIDED LATERAL INTERBODY FUSION, LUMBAR 2-3, LUMBAR 3-4, LUMBAR 4-5 WITH INSTRUMENTATION;  Surgeon: Phylliss Bob, MD;  Location: [facility];  Service: Orthopedics;  Laterality: Left;  LEFT SIDED LATEARL INTERBODY FUSION, LUMBAR 2-3, LUMBAR 3-4, LUMBAR 4-5 WITH INSTRUMENTATION   . APPENDECTOMY    . BACK SURGERY    . BONE MARROW ASPIRATION  07/2012  . BONE MARROW BIOPSY  07/2012  . BREAST SURGERY    . CARDIAC  CATHETERIZATION    . CARDIAC CATHETERIZATION N/A 01/20/2016   Procedure: Left Heart Cath and Coronary Angiography;  Surgeon: Burnell Blanks, MD;  Location: [facility];  Service: Cardiovascular;  Laterality: N/A;  . COLONOSCOPY  11/29/2010   Procedure: COLONOSCOPY;  Surgeon: Rogene Houston, MD;  [facility];  Service: Endoscopy;  Laterality: N/A;  . COLONOSCOPY N/A 02/18/2014   Procedure: COLONOSCOPY;  Surgeon: Rogene Houston, MD;  [facility];  Service: Endoscopy;  Laterality: N/A;  1030  . COLONOSCOPY N/A 02/25/2017   Procedure: COLONOSCOPY;  Surgeon: Rogene Houston, MD;  [facility];  Service: Endoscopy;  Laterality: N/A;  10:55  . CYSTOSCOPY N/A 12/09/2014   Procedure: CYSTOSCOPY;  Surgeon: Bjorn Loser, MD;  Location: [facility];  Service: Urology;  Laterality: N/A;  . ESOPHAGEAL DILATION N/A 02/25/2017   Procedure: ESOPHAGEAL DILATION;  Surgeon: Rogene Houston, MD;  [facility];  Service: Endoscopy;  Laterality: N/A;  . ESOPHAGOGASTRODUODENOSCOPY N/A 02/25/2017   Procedure: ESOPHAGOGASTRODUODENOSCOPY (EGD);  Surgeon: Rogene Houston, MD;  [facility];  Service: Endoscopy;  Laterality: N/A;  . IR GENERIC HISTORICAL  01/11/2016   IR RADIOLOGY PERIPHERAL GUIDED IV START 01/11/2016 Saverio Danker, PA-C [facility]  . IR GENERIC HISTORICAL  01/11/2016   IR US GUIDE VASC ACCESS RIGHT 01/11/2016 Saverio Danker, PA-C [facility]  . LYMPH NODE DISSECTION Right 12/30/2018   Procedure: Lymph Node Dissection;  Surgeon: Lajuana Matte, MD;  Location: [facility];  Service: Thoracic;  Laterality: Right;  . MASTECTOMY     left  . OVARIAN CYST SURGERY     x2  . POLYPECTOMY  02/25/2017   Procedure: POLYPECTOMY;  Surgeon: Rogene Houston, MD;  [facility];  Service: Endoscopy;;  . PORTACATH PLACEMENT Left 02/16/2019   Procedure: INSERTION PORT-A-CATH (CATHETER  LEFT SUBCLAVIAN);  Surgeon: Aviva Signs, MD;   Location: [facility];  Service: General;  Laterality: Left;  . SALPINGOOPHORECTOMY Bilateral 12/09/2014   Procedure: SALPINGO OOPHORECTOMY;  Surgeon: Servando Salina, MD;  Location: [facility];  Service: Gynecology;  Laterality: Bilateral;  . TUBAL LIGATION    . VAGINAL HYSTERECTOMY N/A 12/09/2014   Procedure: HYSTERECTOMY VAGINAL;  Surgeon: Servando Salina, MD;  Location: [facility];  Service: Gynecology;  Laterality: N/A;  . VIDEO ASSISTED THORACOSCOPY (VATS)/ LOBECTOMY Right 12/30/2018   Procedure: VIDEO ASSISTED THORACOSCOPY (VATS)/RIGHT LOWER LOBE WEDGE RESECTION, RIGHT UPPER LOBECTOMY;  Surgeon: Lajuana Matte, MD;  Location: [facility];  Service: Thoracic;  Laterality: Right;  Marland Kitchen VIDEO BRONCHOSCOPY N/A 12/30/2018   Procedure: VIDEO BRONCHOSCOPY;  Surgeon: Lajuana Matte, MD;  Location[facility];  Service: Thoracic;  Laterality: N/A;    SOCIAL HISTORY:  Social History   Socioeconomic History  .  Marital status: Divorced    Spouse name: Not on file  . Number of children: 2  . Years of education: Not on file  . Highest education level: Not on file  Occupational History  . Not on file  Tobacco Use  . Smoking status: Former Smoker    Packs/day: 0.50    Years: 50.00    Pack years: 25.00    Types: Cigarettes    Quit date: 11/25/2018    Years since quitting: 1.1  . Smokeless tobacco: Never Used  Vaping Use  . Vaping Use: Never used  Substance and Sexual Activity  . Alcohol use: Yes    Comment: occasional  . Drug use: No  . Sexual activity: Yes    Birth control/protection: Post-menopausal  Other Topics Concern  . Not on file  Social History Narrative  . Not on file   Social Determinants of Health   Financial Resource Strain:   . Difficulty of Paying Living Expenses: Not on file  Food Insecurity:   . Worried About Charity fundraiser in the Last Year: Not on file  . Ran Out of Food in the Last Year: Not on file  Transportation Needs:   . Lack of Transportation (Medical): Not  on file  . Lack of Transportation (Non-Medical): Not on file  Physical Activity:   . Days of Exercise per Week: Not on file  . Minutes of Exercise per Session: Not on file  Stress:   . Feeling of Stress : Not on file  Social Connections:   . Frequency of Communication with Friends and Family: Not on file  . Frequency of Social Gatherings with Friends and Family: Not on file  . Attends Religious Services: Not on file  . Active Member of Clubs or Organizations: Not on file  . Attends Archivist Meetings: Not on file  . Marital Status: Not on file  Intimate Partner Violence:   . Fear of Current or Ex-Partner: Not on file  . Emotionally Abused: Not on file  . Physically Abused: Not on file  . Sexually Abused: Not on file    FAMILY HISTORY:  Family History  Problem Relation Age of Onset  . Hypertension Mother   . Heart failure Mother   . Congestive Heart Failure Mother   . COPD Mother   . Pernicious anemia Mother   . Cancer Mother        lung  . Hypertension Father   . CAD Father   . Heart attack Father   . Hypertension Sister   . Cancer Other   . Celiac disease Other     CURRENT MEDICATIONS:  Current Outpatient Medications  Medication Sig Dispense Refill  . albuterol (PROVENTIL HFA;VENTOLIN HFA) 108 (90 Base) MCG/ACT inhaler Inhale 2 puffs into the lungs every 6 (six) hours as needed for wheezing or shortness of breath. 1 Inhaler 2  . albuterol (PROVENTIL) (2.5 MG/3ML) 0.083% nebulizer solution Take 2.5 mg by nebulization every 6 (six) hours.    Marland Kitchen aspirin EC 81 MG tablet Take 81 mg by mouth daily.    . Calcium Carb-Cholecalciferol (CALCIUM 600+D) 600-800 MG-UNIT TABS Take 1 tablet by mouth 2 (two) times daily.    . Cholecalciferol (VITAMIN D-3) 1000 units CAPS Take 1,000 Units daily by mouth.     . clonazePAM (KLONOPIN) 0.5 MG tablet TAKE ONE TABLET BY MOUTH AT BEDTIME. 90 tablet 0  . cyanocobalamin (,VITAMIN B-12,) 1000 MCG/ML injection INJECT 1 ML INTO THE  MUSCLE ONCE  MONTHLY AS DIRECTED. (Patient taking differently: Inject 1,000 mcg into the muscle every 30 (thirty) days. ) 1 mL 5  . dexamethasone (DECADRON) 2 MG tablet Take 1 tablet (2 mg total) by mouth daily. (Patient taking differently: Take 1 mg by mouth daily. Taking 1/2 tablet) 30 tablet 3  . ergocalciferol (VITAMIN D2) 1.25 MG (50000 UT) capsule Take by mouth.    . gabapentin (NEURONTIN) 600 MG tablet Take 1 tablet (600 mg total) by mouth daily. 30 tablet 6  . guaiFENesin (MUCINEX) 600 MG 12 hr tablet Take 1 tablet (600 mg total) by mouth 2 (two) times daily as needed for cough or to loosen phlegm.    . ibandronate (BONIVA) 150 MG tablet Take 150 mg by mouth every 30 (thirty) days.     Marland Kitchen ibuprofen (ADVIL,MOTRIN) 800 MG tablet Take 800 mg by mouth every 8 (eight) hours as needed for moderate pain.     Marland Kitchen ipratropium (ATROVENT) 0.02 % nebulizer solution Take by nebulization.    Marland Kitchen ipratropium-albuterol (DUONEB) 0.5-2.5 (3) MG/3ML SOLN Take 3 mLs by nebulization every 6 (six) hours as needed (shortness of breath). 360 mL 0  . lidocaine-prilocaine (EMLA) cream Apply a small amount to port a cath site and cover with plastic wrap 1 hour prior to chemotherapy appointments 30 g 3  . methocarbamol (ROBAXIN) 500 MG tablet Take 500 mg by mouth every 6 (six) hours as needed for muscle spasms.     . nitroGLYCERIN (NITRODUR - DOSED IN MG/24 HR) 0.1 mg/hr patch Place 0.1 mg onto the skin daily.     . ondansetron (ZOFRAN ODT) 8 MG disintegrating tablet Take 1 tablet (8 mg total) by mouth every 8 (eight) hours as needed for nausea or vomiting. 60 tablet 2  . oxyCODONE (OXY IR/ROXICODONE) 5 MG immediate release tablet Take 1 tablet (5 mg total) by mouth every 8 (eight) hours. 90 tablet 0  . OXYGEN Inhale 3 L into the lungs daily.    . pantoprazole (PROTONIX) 40 MG tablet TAKE ONE TABLET BY MOUTH TWICE DAILY. 60 tablet 0  . polyethylene glycol (MIRALAX / GLYCOLAX) 17 g packet Take 17 g by mouth daily as needed for  mild constipation.    . potassium chloride SA (KLOR-CON) 20 MEQ tablet Take 1 tablet (20 mEq total) by mouth 3 (three) times daily. (Patient taking differently: Take 20 mEq by mouth daily. ) 60 tablet 2  . rizatriptan (MAXALT) 10 MG tablet Take 10 mg by mouth 2 (two) times daily. 1-2 times daily May repeat in 2 hours if needed    . rosuvastatin (CRESTOR) 40 MG tablet Take 40 mg at bedtime by mouth.     . traMADol (ULTRAM) 50 MG tablet Take 50 mg by mouth every 6 (six) hours as needed.    . triamcinolone cream (KENALOG) 0.1 %      No current facility-administered medications for this visit.   Facility-Administered Medications Ordered in Other Visits  Medication Dose Route Frequency Provider Last Rate Last Admin  . 0.9 %  sodium chloride infusion   Intravenous Continuous Derek Jack, MD      . sodium chloride flush (NS) 0.9 % injection 10 mL  10 mL Intravenous PRN Lockamy, Randi L, NP-C   10 mL at 12/22/19 1240    ALLERGIES:  Allergies  Allergen Reactions  . Penicillins Hives and Rash    Did it involve swelling of the face/tongue/throat, SOB, or low BP? No Did it involve sudden or severe rash/hives, skin peeling,  or any reaction on the inside of your mouth or nose? No Did you need to seek medical attention at a hospital or doctor's office? No When did it last happen?5-10 year If all above answers are "NO", may proceed with cephalosporin use.    . Tape Rash    Medipore, Coban, and paper tape CAN be tolerated    PHYSICAL EXAM:  Performance status (ECOG): 1 - Symptomatic but completely ambulatory  Vitals:   01/26/20 1222 01/26/20 1259  BP: (!) 89/66 (!) 89/62  Pulse: 83   Resp: 17   Temp: (!) 96.9 F (36.1 C)   SpO2: 98%    Wt Readings from Last 3 Encounters:  01/26/20 95 lb 6.4 oz (43.3 kg)  01/05/20 97 lb 12.8 oz (44.4 kg)  12/22/19 98 lb 1.6 oz (44.5 kg)   Physical Exam Vitals reviewed.  Constitutional:      Appearance: Normal appearance.  Chest:      Comments: Port-a-Cath in L chest Neurological:     General: No focal deficit present.     Mental Status: She is alert and oriented to person, place, and time.  Psychiatric:        Mood and Affect: Mood normal.        Behavior: Behavior normal.     LABORATORY DATA:  I have reviewed the labs as listed.  CBC Latest Ref Rng & Units 01/26/2020 01/05/2020 12/22/2019  WBC 4.0 - 10.5 K/uL 10.1 13.3(H) 4.8  Hemoglobin 12.0 - 15.0 g/dL 11.1(L) 11.0(L) 11.7(L)  Hematocrit 36 - 46 % 35.5(L) 35.0(L) 36.7  Platelets 150 - 400 K/uL 605(H) 706(H) 571(H)   CMP Latest Ref Rng & Units 01/26/2020 01/05/2020 12/22/2019  Glucose 70 - 99 mg/dL 130(H) 117(H) 98  BUN 8 - 23 mg/dL '18 14 13  ' Creatinine 0.44 - 1.00 mg/dL 1.07(H) 0.83 0.82  Sodium 135 - 145 mmol/L 138 136 134(L)  Potassium 3.5 - 5.1 mmol/L 3.4(L) 3.6 3.9  Chloride 98 - 111 mmol/L 102 104 100  CO2 22 - 32 mmol/L '27 23 25  ' Calcium 8.9 - 10.3 mg/dL 8.5(L) 8.8(L) 8.2(L)  Total Protein 6.5 - 8.1 g/dL 6.4(L) 6.4(L) 6.2(L)  Total Bilirubin 0.3 - 1.2 mg/dL 0.5 0.5 0.7  Alkaline Phos 38 - 126 U/L 65 69 67  AST 15 - 41 U/L '19 20 20  ' ALT 0 - 44 U/L '14 12 21   ' Lab Results  Component Value Date   LDH 119 01/26/2020   LDH 113 09/17/2019   LDH 197 (H) 01/20/2019    DIAGNOSTIC IMAGING:  I have independently reviewed the scans and discussed with the patient. No results found.   ASSESSMENT:  1. 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 -30 Gy of radiation in 10 fractions from 01/28/2019 through 02/10/2019. -4 cycles of carboplatin and etoposide from 03/09/2019 through 05/11/2019. -PCI completed on 07/31/2019. -We reviewed MRI of the brain on 09/15/2019 with no evidence of brain meta stasis. Concern for  hypointense appearance at the C3 vertebral body and left articular process. -CT chest on 09/15/2019 shows numerous new small solid irregular pulmonary nodules scattered throughout both lungs, largest 7 mm on the right lower lobe. Tiny loculated anterior right pleural effusion decreased. Stable mild subcarinal adenopathy. Bilateral stable adrenal adenomas. -MRI of the C-spine on  10/13/2019 showed marrow edema and enhancement involving C3 vertebral body and left articular process without discrete bone lesion. This was thought to be degenerative. -PET scan on 09/28/2019 shows bilateral lung nodules, below PET resolution. Small right-sided nodular densities demonstrating low-level hypermetabolism. Mild hypermetabolic corresponding to a normal-sized subcarinal lymph node. Multifocal right pleural hypermetabolism in the setting of pleural thickening and prior right upper lobectomy, indeterminate. No hypermetabolic extrathoracic disease. -PET scan on 11/24/2019 shows progressive increased soft tissue within the right lung along the suture margins, positive right paratracheal lymph node, small subcapsular focus of increased radiotracer uptake overlying the anterolateral right hepatic lobe concerning for tumor.   PLAN:  1. 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  She reported pain has increased since we increase the dose of Lurbinectidin. -I reviewed her labs.  Albumin is low at 3.2.  Other LFTs are normal.  Creatinine slightly increased to 1.07. -Her blood pressure is 90 systolic.  I have recommended 1 L of fluid over 2 hours today. -I will decrease her chemotherapy to 2.6 mg per metered squared today. -Plan to see her back in 3 weeks for follow-up.  Plan to repeat PET scan to evaluate response. -We will change her Zofran to sublingual.  2. Essential thrombocytosis: -Hydrea on hold since chemotherapy  started for lung cancer.  Platelet count is 605 today.  We will closely monitor.  3. Macrocytic anemia: -Hemoglobin is 11.1 with normal MCV.  4. Weight loss: -She is continuing dexamethasone 1 mg in the mornings.  5. Mid back and sternal pain: -She is taking oxycodone 5 mg 1 tablet in the morning, 1 in the afternoon and 2 tablets at bedtime.  She is taking ibuprofen and tramadol in between as needed.  6. Hypokalemia: -Continue potassium 20 mEq daily.  Potassium today 3.4.   Orders placed this encounter:  Orders Placed This Encounter  Procedures  . NM PET Image Restag (PS) Skull Base To Thigh     Derek Jack, MD [place] [phone]   I, Milinda Antis, am acting as a scribe for Dr. Sanda Linger.  I, Derek Jack MD, have reviewed the above documentation for accuracy and completeness, and I agree with the above.

## 2020-01-26 NOTE — Patient Instructions (Signed)
[place] at [facility] Discharge Instructions  You were seen today by Dr. Delton Coombes. He went over your recent results. You received your treatment today. Drink 3 cans of Boost or Ensure daily to maintain your weight. You will be scheduled for a PET scan before your next visit. Dr. Delton Coombes will see you back in 3 weeks for labs and follow up.   Thank you for choosing [place] at [facility] to provide your oncology and hematology care.  To afford each patient quality time with our provider, please arrive at least 15 minutes before your scheduled appointment time.   If you have a lab appointment with the [facility] please come in thru the Main Entrance and check in at the main information desk  You need to re-schedule your appointment should you arrive 10 or more minutes late.  We strive to give you quality time with our providers, and arriving late affects you and other patients whose appointments are after yours.  Also, if you no show three or more times for appointments you may be dismissed from the clinic at the providers discretion.     Again, thank you for choosing [facility].  Our hope is that these requests will decrease the amount of time that you wait before being seen by our physicians.       _____________________________________________________________  Should you have questions after your visit to [facility], please contact our office at [phone] between the hours of 8:00 a.m. and 4:30 p.m.  Voicemails left after 4:00 p.m. will not be returned until the following business day.  For prescription refill requests, have your pharmacy contact our office and allow 72 hours.    Cancer Center Support Programs:   > Cancer Support Group  2nd Tuesday of the month 1pm-2pm, Journey Room

## 2020-01-26 NOTE — Patient Instructions (Signed)
[facility] at [facility] Discharge Instructions  Labs drawn from portacath today   Thank you for choosing [facility] at [facility] to provide your oncology and hematology care.  To afford each patient quality time with our provider, please arrive at least 15 minutes before your scheduled appointment time.   If you have a lab appointment with the Cancer Center please come in thru the Main Entrance and check in at the main information desk.  You need to re-schedule your appointment should you arrive 10 or more minutes late.  We strive to give you quality time with our providers, and arriving late affects you and other patients whose appointments are after yours.  Also, if you no show three or more times for appointments you may be dismissed from the clinic at the providers discretion.     Again, thank you for choosing [facility].  Our hope is that these requests will decrease the amount of time that you wait before being seen by our physicians.       _____________________________________________________________  Should you have questions after your visit to [facility], please contact our office at [phone] and follow the prompts.  Our office hours are 8:00 a.m. and 4:30 p.m. Monday - Friday.  Please note that voicemails left after 4:00 p.m. may not be returned until the following business day.  We are closed weekends and major holidays.  You do have access to a nurse 24-7, just call the main number to the clinic [phone] and do not press any options, hold on the line and a nurse will answer the phone.    For prescription refill requests, have your pharmacy contact our office and allow 72 hours.    Due to Covid, you will need to wear a mask upon entering the hospital. If you do not have a mask, a mask will be given to you at the Main Entrance upon arrival. For doctor visits, patients may have 1 support person age 18  or older with them. For treatment visits, patients can not have anyone with them due to social distancing guidelines and our immunocompromised population.     

## 2020-01-26 NOTE — Patient Instructions (Signed)
[facility] Discharge Instructions for Patients Receiving Chemotherapy  Today you received the following chemotherapy agents   To help prevent nausea and vomiting after your treatment, we encourage you to take your nausea medication   If you develop nausea and vomiting that is not controlled by your nausea medication, call the clinic.   BELOW ARE SYMPTOMS THAT SHOULD BE REPORTED IMMEDIATELY:  *FEVER GREATER THAN 100.5 F  *CHILLS WITH OR WITHOUT FEVER  NAUSEA AND VOMITING THAT IS NOT CONTROLLED WITH YOUR NAUSEA MEDICATION  *UNUSUAL SHORTNESS OF BREATH  *UNUSUAL BRUISING OR BLEEDING  TENDERNESS IN MOUTH AND THROAT WITH OR WITHOUT PRESENCE OF ULCERS  *URINARY PROBLEMS  *BOWEL PROBLEMS  UNUSUAL RASH Items with * indicate a potential emergency and should be followed up as soon as possible.  Feel free to call the clinic should you have any questions or concerns. The clinic phone number is [phone].  Please show the CHEMO ALERT CARD at check-in to the Emergency Department and triage nurse.   

## 2020-02-12 ENCOUNTER — Other Ambulatory Visit: Payer: Self-pay

## 2020-02-12 ENCOUNTER — Ambulatory Visit (HOSPITAL_COMMUNITY)
Admission: RE | Admit: 2020-02-12 | Discharge: 2020-02-12 | Disposition: A | Discharge status: Home / Self Care | Payer: Medicare Other | Source: Ambulatory Visit | Attending: Hematology | Admitting: Hematology

## 2020-02-12 DIAGNOSIS — C3491 Malignant neoplasm of unspecified part of right bronchus or lung: Secondary | ICD-10-CM

## 2020-02-12 DIAGNOSIS — Z902 Acquired absence of lung [part of]: Secondary | ICD-10-CM | POA: Insufficient documentation

## 2020-02-12 LAB — GLUCOSE, CAPILLARY: Glucose-Capillary: 82 mg/dL (ref 70–99)

## 2020-02-12 MED ORDER — FLUDEOXYGLUCOSE F - 18 (FDG) INJECTION
5.4000 | Freq: Once | INTRAVENOUS | Status: AC
  Administered 2020-02-12: 5.4 via INTRAVENOUS

## 2020-02-17 ENCOUNTER — Inpatient Hospital Stay (HOSPITAL_BASED_OUTPATIENT_CLINIC_OR_DEPARTMENT_OTHER): Payer: Medicare Other | Admitting: Hematology

## 2020-02-17 ENCOUNTER — Inpatient Hospital Stay (HOSPITAL_COMMUNITY): Payer: Medicare Other

## 2020-02-17 ENCOUNTER — Encounter (HOSPITAL_COMMUNITY): Payer: Self-pay | Admitting: Hematology

## 2020-02-17 ENCOUNTER — Other Ambulatory Visit: Payer: Self-pay

## 2020-02-17 ENCOUNTER — Inpatient Hospital Stay (HOSPITAL_COMMUNITY): Payer: Medicare Other | Attending: Hematology

## 2020-02-17 VITALS — BP 130/56 | HR 60 | Temp 97.2°F | Resp 16

## 2020-02-17 VITALS — BP 120/66 | HR 65 | Temp 97.2°F | Resp 16

## 2020-02-17 DIAGNOSIS — Z23 Encounter for immunization: Secondary | ICD-10-CM

## 2020-02-17 DIAGNOSIS — Z171 Estrogen receptor negative status [ER-]: Secondary | ICD-10-CM | POA: Diagnosis not present

## 2020-02-17 DIAGNOSIS — Z923 Personal history of irradiation: Secondary | ICD-10-CM | POA: Insufficient documentation

## 2020-02-17 DIAGNOSIS — Z853 Personal history of malignant neoplasm of breast: Secondary | ICD-10-CM | POA: Insufficient documentation

## 2020-02-17 DIAGNOSIS — Z79899 Other long term (current) drug therapy: Secondary | ICD-10-CM | POA: Insufficient documentation

## 2020-02-17 DIAGNOSIS — C3491 Malignant neoplasm of unspecified part of right bronchus or lung: Secondary | ICD-10-CM | POA: Diagnosis not present

## 2020-02-17 DIAGNOSIS — D473 Essential (hemorrhagic) thrombocythemia: Secondary | ICD-10-CM | POA: Diagnosis not present

## 2020-02-17 DIAGNOSIS — C3411 Malignant neoplasm of upper lobe, right bronchus or lung: Secondary | ICD-10-CM | POA: Diagnosis present

## 2020-02-17 DIAGNOSIS — D539 Nutritional anemia, unspecified: Secondary | ICD-10-CM | POA: Insufficient documentation

## 2020-02-17 DIAGNOSIS — R630 Anorexia: Secondary | ICD-10-CM | POA: Diagnosis not present

## 2020-02-17 DIAGNOSIS — M549 Dorsalgia, unspecified: Secondary | ICD-10-CM | POA: Insufficient documentation

## 2020-02-17 DIAGNOSIS — Z9012 Acquired absence of left breast and nipple: Secondary | ICD-10-CM | POA: Diagnosis not present

## 2020-02-17 DIAGNOSIS — Z5111 Encounter for antineoplastic chemotherapy: Secondary | ICD-10-CM | POA: Diagnosis present

## 2020-02-17 DIAGNOSIS — E876 Hypokalemia: Secondary | ICD-10-CM | POA: Diagnosis not present

## 2020-02-17 LAB — CBC WITH DIFFERENTIAL/PLATELET
Abs Immature Granulocytes: 0.04 10*3/uL (ref 0.00–0.07)
Basophils Absolute: 0 10*3/uL (ref 0.0–0.1)
Basophils Relative: 0 %
Eosinophils Absolute: 0.3 10*3/uL (ref 0.0–0.5)
Eosinophils Relative: 3 %
HCT: 36.7 % (ref 36.0–46.0)
Hemoglobin: 11.5 g/dL — ABNORMAL LOW (ref 12.0–15.0)
Immature Granulocytes: 0 %
Lymphocytes Relative: 14 %
Lymphs Abs: 1.4 10*3/uL (ref 0.7–4.0)
MCH: 28.2 pg (ref 26.0–34.0)
MCHC: 31.3 g/dL (ref 30.0–36.0)
MCV: 90 fL (ref 80.0–100.0)
Monocytes Absolute: 1 10*3/uL (ref 0.1–1.0)
Monocytes Relative: 10 %
Neutro Abs: 7.1 10*3/uL (ref 1.7–7.7)
Neutrophils Relative %: 73 %
Platelets: 607 10*3/uL — ABNORMAL HIGH (ref 150–400)
RBC: 4.08 MIL/uL (ref 3.87–5.11)
RDW: 16.6 % — ABNORMAL HIGH (ref 11.5–15.5)
WBC: 9.9 10*3/uL (ref 4.0–10.5)
nRBC: 0 % (ref 0.0–0.2)

## 2020-02-17 LAB — COMPREHENSIVE METABOLIC PANEL
ALT: 13 U/L (ref 0–44)
AST: 23 U/L (ref 15–41)
Albumin: 3.3 g/dL — ABNORMAL LOW (ref 3.5–5.0)
Alkaline Phosphatase: 62 U/L (ref 38–126)
Anion gap: 9 (ref 5–15)
BUN: 15 mg/dL (ref 8–23)
CO2: 25 mmol/L (ref 22–32)
Calcium: 9 mg/dL (ref 8.9–10.3)
Chloride: 103 mmol/L (ref 98–111)
Creatinine, Ser: 0.85 mg/dL (ref 0.44–1.00)
GFR, Estimated: >60 mL/min (ref >60)
Glucose, Bld: 85 mg/dL (ref 70–99)
Potassium: 3.1 mmol/L — ABNORMAL LOW (ref 3.5–5.1)
Sodium: 137 mmol/L (ref 135–145)
Total Bilirubin: 0.6 mg/dL (ref 0.3–1.2)
Total Protein: 6.7 g/dL (ref 6.5–8.1)

## 2020-02-17 LAB — MAGNESIUM: Magnesium: 1.8 mg/dL (ref 1.7–2.4)

## 2020-02-17 MED ORDER — SODIUM CHLORIDE 0.9% FLUSH
10.0000 mL | INTRAVENOUS | Status: DC | PRN
  Administered 2020-02-17: 10 mL

## 2020-02-17 MED ORDER — HEPARIN SOD (PORK) LOCK FLUSH 100 UNIT/ML IV SOLN
500.0000 [IU] | Freq: Once | INTRAVENOUS | Status: AC | PRN
  Administered 2020-02-17: 500 [IU]

## 2020-02-17 MED ORDER — SODIUM CHLORIDE 0.9 % IV SOLN: Freq: Once | INTRAVENOUS | Status: AC

## 2020-02-17 MED ORDER — PALONOSETRON HCL INJECTION 0.25 MG/5ML
0.2500 mg | Freq: Once | INTRAVENOUS | Status: AC
  Administered 2020-02-17: 0.25 mg via INTRAVENOUS

## 2020-02-17 MED ORDER — INFLUENZA VAC A&B SA ADJ QUAD 0.5 ML IM PRSY: 0.5000 mL | PREFILLED_SYRINGE | Freq: Once | INTRAMUSCULAR | Status: DC

## 2020-02-17 MED ORDER — SODIUM CHLORIDE 0.9 % IV SOLN
10.0000 mg | Freq: Once | INTRAVENOUS | Status: AC
  Administered 2020-02-17: 10 mg via INTRAVENOUS
  Filled 2020-02-17: qty 10

## 2020-02-17 MED ORDER — SODIUM CHLORIDE 0.9 % IV SOLN
2.6000 mg/m2 | Freq: Once | INTRAVENOUS | Status: AC
  Administered 2020-02-17: 3.65 mg via INTRAVENOUS
  Filled 2020-02-17: qty 7.3

## 2020-02-17 MED ORDER — POTASSIUM CHLORIDE CRYS ER 20 MEQ PO TBCR
40.0000 meq | EXTENDED_RELEASE_TABLET | Freq: Once | ORAL | Status: AC
  Administered 2020-02-17: 40 meq via ORAL
  Filled 2020-02-17: qty 2

## 2020-02-17 NOTE — Progress Notes (Signed)
Ashley [name], Ashley [name]   CLINIC:  Medical Oncology/Hematology  PCP:  Ashley Rival, NP [address] / [place] [address] [phone]   REASON FOR VISIT:  Follow-up for right small cell lung cancer  PRIOR THERAPY:  1. Right upper lobectomy on 12/30/2018. 2. 30 Gy of radiation in 10 fractions from 01/28/2019 through 02/10/2019. 3. Carboplatin and etoposide x 4 cycles from 03/09/2019 through 05/11/2019.  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 The 3 liver lesions are not hypermetabolic. The radiologist suspects they may be subtly present on prior CT chest from 10/2013, to further reassuring that these are likely benign lesions.   Melanoma ([facility])  07/09/2013 Initial Biopsy   Initial biopsy L upper thigh/buttocks melanoma   07/20/2013 Surgery   Excision L lateral buttock/upper thigh melanoma, clear margins   07/20/2013 Pathology Results   Breslow depth 1.02 mm, pT2a    Small cell lung carcinoma, right ([facility])  11/27/2018 Initial Diagnosis   Small cell lung carcinoma, right ([facility])   03/09/2019 - 05/15/2019 Chemotherapy   The patient had palonosetron (ALOXI) injection 0.25 mg, 0.25 mg, Intravenous,  Once, 4 of 4 cycles Administration: 0.25 mg (03/09/2019), 0.25 mg (03/30/2019), 0.25 mg (04/20/2019), 0.25 mg (05/11/2019) pegfilgrastim (NEULASTA ONPRO KIT) injection 6 mg, 6  mg, Subcutaneous, Once, 1 of 1 cycle Administration: 6 mg (03/11/2019) pegfilgrastim-jmdb (FULPHILA) injection 6 mg, 6 mg, Subcutaneous,  Once, 3 of 3 cycles Administration: 6 mg (04/03/2019), 6 mg (04/24/2019), 6 mg (05/15/2019) CARBOplatin (PARAPLATIN) 330 mg in sodium chloride 0.9 % 250 mL chemo infusion, 330 mg (100 % of original dose 325.5 mg), Intravenous,  Once, 4 of 4 cycles Dose modification:   (original dose 325.5 mg, Cycle 1),   (original dose 325.5 mg, Cycle 2),   (original dose 309 mg, Cycle 3),   (original dose 325.5 mg, Cycle 4) Administration: 330 mg (03/09/2019), 330 mg (03/30/2019), 310 mg (04/20/2019), 330 mg (05/11/2019) etoposide (VEPESID) 150 mg in sodium chloride 0.9 % 500 mL chemo infusion, 100 mg/m2 = 150 mg, Intravenous,  Once, 4 of 4 cycles Administration: 150 mg (03/09/2019), 150 mg (03/10/2019), 150 mg (03/11/2019), 150 mg (03/30/2019), 150 mg (03/31/2019), 150 mg (04/01/2019), 150 mg (04/20/2019), 150 mg (04/21/2019), 150 mg (04/22/2019), 150 mg (05/11/2019), 150 mg (05/12/2019), 150 mg (05/13/2019) fosaprepitant (EMEND) 150 mg, dexamethasone (DECADRON) 12 mg in sodium chloride 0.9 % 145 mL IVPB, , Intravenous,  Once, 4 of 4 cycles Administration:  (03/09/2019),  (03/30/2019),  (04/20/2019),  (05/11/2019)  for chemotherapy treatment.    03/09/2019 Cancer Staging   Staging form: Lung, AJCC 8th Edition - Clinical: Stage IIIA (cT3, cN1, cM0) - Signed by Derek Jack, MD on 03/09/2019   12/15/2019 -  Chemotherapy   The patient had palonosetron (ALOXI) injection 0.25 mg, 0.25 mg, Intravenous,  Once, 3 of 8 cycles Administration: 0.25  mg (12/15/2019), 0.25 mg (01/05/2020), 0.25 mg (01/26/2020) lurbinectedin (ZEPZELCA) 3.65 mg in sodium chloride 0.9 % 250 mL chemo infusion, 2.6 mg/m2 = 3.65 mg (81.3 % of original dose 3.2 mg/m2), Intravenous,  Once, 3 of 8 cycles Dose modification: 2.6 mg/m2 (original dose 3.2 mg/m2, Cycle 1, Reason: Provider Judgment), 2.6 mg/m2 (original dose 3.2  mg/m2, Cycle 3, Reason: Provider Judgment) Administration: 3.65 mg (12/15/2019), 4.5 mg (01/05/2020), 3.65 mg (01/26/2020)  for chemotherapy treatment.      CANCER STAGING: Cancer Staging Small cell lung carcinoma, right (HCC) Staging form: Lung, AJCC 8th Edition - Clinical: Stage IIIA (cT3, cN1, cM0) - Signed by Derek Jack, MD on 03/09/2019   INTERVAL HISTORY:  [name], a 69 y.o. female, returns for routine follow-up and consideration for next cycle of chemotherapy. [name] was last seen on 01/26/2020.  Due for cycle #4 of lurbinectedin and Aloxi today.   Today she is accompanied by her daughter. Overall, she tells me she has been feeling good. She reports developing overall joint and muscle aches 5 days after her last treatment. The worst pain is in her upper abdomen and chest pain. She reports nausea with this treatment but takes Zofran which helps. She has lost 5 lbs in 3 weeks and eats main soup; she stopped drinking Boost or Ensure.  Overall, she feels ready for next cycle of chemo today.    REVIEW OF SYSTEMS:  Review of Systems  Constitutional: Positive for appetite change (50%), fatigue (50%) and unexpected weight change (lost 5 lbs in 3 weeks).  Respiratory: Positive for shortness of breath (on nebulizer) and wheezing.   Cardiovascular: Positive for chest pain (7/10 chest and back pain) and palpitations.  Gastrointestinal: Positive for constipation and nausea.  Neurological: Positive for dizziness (occasional), headaches and numbness (feet).  All other systems reviewed and are negative.   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 No evidence of ischemia/injury based upon normal troponin I. Chest discomfort could be  tachycardia induced supply demand mismatch.    CHF (congestive heart failure) ([facility]) 11/17/2015   after surgery    Colon adenomas    Coronary artery disease    DJD (degenerative joint disease)    Dyspnea    with exertion   Emphysema of lung ([facility]) 11/17/2015   Essential hypertension, benign    GERD (gastroesophageal reflux disease)    History of hiatal hernia    Hyperlipidemia    Hypertension    Melanoma ([facility]) 01/09/2016   Osteopenia    Palpitations    Pernicious anemia 03/06/2016   Pernicious anemia    Pure hypercholesterolemia    Raynaud's disease    Thrombocythemia, essential ([facility]) 01/09/2016   Thrombocytosis    Idiopathic   Vitamin D deficiency    Past Surgical History:  Procedure Laterality Date   ABDOMINAL HYSTERECTOMY     ANTERIOR AND POSTERIOR REPAIR N/A 12/09/2014   Procedure: ANTERIOR (CYSTOCELE) AND POSTERIOR REPAIR (RECTOCELE);  Surgeon: Bjorn Loser, MD;  Location: [facility];  Service: Urology;  Laterality: N/A;   ANTERIOR CERVICAL DECOMPRESSION/DISCECTOMY FUSION 4 LEVELS Right 10/03/2016   Procedure: ANTERIOR CERVICAL DECOMPRESSION FUSION, CERVICAL 4-5, CERVICAL 5-6, CERVICAL 6-7, CERVICAL 7 TO THORACIC 1 WITH INSTRUMENTATION AND ALLOGRAFT;  Surgeon:  Phylliss Bob, MD;  Location: [facility];  Service: Orthopedics;  Laterality: Right;  ANTERIOR CERVICAL DECOMPRESSION FUSION, CERVICAL 4-5, CERVICAL 5-6, CERVICAL 6-7, CERVICAL 7 TO THORACIC 1 WITH INSTRUMENTATION AND ALLOGRAFT; REQUEST 4 HO   ANTERIOR LAT LUMBAR FUSION Left 11/16/2015   Procedure: LEFT SIDED LATERAL INTERBODY FUSION, LUMBAR 2-3, LUMBAR 3-4, LUMBAR 4-5 WITH INSTRUMENTATION;  Surgeon: Phylliss Bob, MD;  Location: [facility];  Service: Orthopedics;  Laterality: Left;  LEFT SIDED LATEARL INTERBODY FUSION, LUMBAR 2-3, LUMBAR 3-4, LUMBAR 4-5 WITH INSTRUMENTATION    APPENDECTOMY     BACK SURGERY     BONE MARROW ASPIRATION  07/2012   BONE MARROW BIOPSY  07/2012   BREAST SURGERY     CARDIAC  CATHETERIZATION     CARDIAC CATHETERIZATION N/A 01/20/2016   Procedure: Left Heart Cath and Coronary Angiography;  Surgeon: Burnell Blanks, MD;  Location: [facility];  Service: Cardiovascular;  Laterality: N/A;   COLONOSCOPY  11/29/2010   Procedure: COLONOSCOPY;  Surgeon: Rogene Houston, MD;  [facility];  Service: Endoscopy;  Laterality: N/A;   COLONOSCOPY N/A 02/18/2014   Procedure: COLONOSCOPY;  Surgeon: Rogene Houston, MD;  [facility];  Service: Endoscopy;  Laterality: N/A;  1030   COLONOSCOPY N/A 02/25/2017   Procedure: COLONOSCOPY;  Surgeon: Rogene Houston, MD;  [facility];  Service: Endoscopy;  Laterality: N/A;  10:55   CYSTOSCOPY N/A 12/09/2014   Procedure: CYSTOSCOPY;  Surgeon: Bjorn Loser, MD;  Location: [facility];  Service: Urology;  Laterality: N/A;   ESOPHAGEAL DILATION N/A 02/25/2017   Procedure: ESOPHAGEAL DILATION;  Surgeon: Rogene Houston, MD;  [facility];  Service: Endoscopy;  Laterality: N/A;   ESOPHAGOGASTRODUODENOSCOPY N/A 02/25/2017   Procedure: ESOPHAGOGASTRODUODENOSCOPY (EGD);  Surgeon: Rogene Houston, MD;  [facility];  Service: Endoscopy;  Laterality: N/A;   IR GENERIC HISTORICAL  01/11/2016   IR RADIOLOGY PERIPHERAL GUIDED IV START 01/11/2016 Saverio Danker, PA-C [facility]   IR GENERIC HISTORICAL  01/11/2016   IR US GUIDE VASC ACCESS RIGHT 01/11/2016 Saverio Danker, PA-C [facility]   LYMPH NODE DISSECTION Right 12/30/2018   Procedure: Lymph Node Dissection;  Surgeon: Lajuana Matte, MD;  Location: [facility];  Service: Thoracic;  Laterality: Right;   MASTECTOMY     left   OVARIAN CYST SURGERY     x2   POLYPECTOMY  02/25/2017   Procedure: POLYPECTOMY;  Surgeon: Rogene Houston, MD;  [facility];  Service: Endoscopy;;   PORTACATH PLACEMENT Left 02/16/2019   Procedure: INSERTION PORT-A-CATH (CATHETER  LEFT SUBCLAVIAN);  Surgeon: Aviva Signs, MD;   Location: [facility];  Service: General;  Laterality: Left;   SALPINGOOPHORECTOMY Bilateral 12/09/2014   Procedure: SALPINGO OOPHORECTOMY;  Surgeon: Servando Salina, MD;  Location: [facility];  Service: Gynecology;  Laterality: Bilateral;   TUBAL LIGATION     VAGINAL HYSTERECTOMY N/A 12/09/2014   Procedure: HYSTERECTOMY VAGINAL;  Surgeon: Servando Salina, MD;  Location: [facility];  Service: Gynecology;  Laterality: N/A;   VIDEO ASSISTED THORACOSCOPY (VATS)/ LOBECTOMY Right 12/30/2018   Procedure: VIDEO ASSISTED THORACOSCOPY (VATS)/RIGHT LOWER LOBE WEDGE RESECTION, RIGHT UPPER LOBECTOMY;  Surgeon: Lajuana Matte, MD;  Location: [facility];  Service: Thoracic;  Laterality: Right;   VIDEO BRONCHOSCOPY N/A 12/30/2018   Procedure: VIDEO BRONCHOSCOPY;  Surgeon: Lajuana Matte, MD;  Location: [facility];  Service: Thoracic;  Laterality: N/A;    SOCIAL HISTORY:  Social History   Socioeconomic History  Marital status: Divorced    Spouse name: Not on file   Number of children: 2   Years of education: Not on file   Highest education level: Not on file  Occupational History   Not on file  Tobacco Use   Smoking status: Former Smoker    Packs/day: 0.50    Years: 50.00    Pack years: 25.00    Types: Cigarettes    Quit date: 11/25/2018    Years since quitting: 1.2   Smokeless tobacco: Never Used  Vaping Use   Vaping Use: Never used  Substance and Sexual Activity   Alcohol use: Yes    Comment: occasional   Drug use: No   Sexual activity: Not Currently    Birth control/protection: Post-menopausal  Other Topics Concern   Not on file  Social History Narrative   Not on file   Social Determinants of Health   Financial Resource Strain: Low Risk    Difficulty of Paying Living Expenses: Not hard at all  Food Insecurity: No Food Insecurity   Worried About Charity fundraiser in the Last Year: Never true   [place] in the Last Year: Never true  Transportation Needs: No  Transportation Needs   Lack of Transportation (Medical): No   Lack of Transportation (Non-Medical): No  Physical Activity: Insufficiently Active   Days of Exercise per Week: 4 days   Minutes of Exercise per Session: 30 min  Stress: No Stress Concern Present   Feeling of Stress : Not at all  Social Connections: Moderately Isolated   Frequency of Communication with Friends and Family: More than three times a week   Frequency of Social Gatherings with Friends and Family: More than three times a week   Attends Religious Services: More than 4 times per year   Active Member of [employer] or Organizations: No   Attends Music therapist: Never   Marital Status: Divorced  Human resources officer Violence: Not At Risk   Fear of Current or Ex-Partner: No   Emotionally Abused: No   Physically Abused: No   Sexually Abused: No    FAMILY HISTORY:  Family History  Problem Relation Age of Onset   Hypertension Mother    Heart failure Mother    Congestive Heart Failure Mother    COPD Mother    Pernicious anemia Mother    Cancer Mother        lung   Hypertension Father    CAD Father    Heart attack Father    Hypertension Sister    Cancer Other    Celiac disease Other     CURRENT MEDICATIONS:  Current Outpatient Medications  Medication Sig Dispense Refill   albuterol (PROVENTIL HFA;VENTOLIN HFA) 108 (90 Base) MCG/ACT inhaler Inhale 2 puffs into the lungs every 6 (six) hours as needed for wheezing or shortness of breath. 1 Inhaler 2   albuterol (PROVENTIL) (2.5 MG/3ML) 0.083% nebulizer solution Take 2.5 mg by nebulization every 6 (six) hours.     aspirin EC 81 MG tablet Take 81 mg by mouth daily.     Calcium Carb-Cholecalciferol (CALCIUM 600+D) 600-800 MG-UNIT TABS Take 1 tablet by mouth 2 (two) times daily.     Cholecalciferol (VITAMIN D-3) 1000 units CAPS Take 1,000 Units daily by mouth.      clonazePAM (KLONOPIN) 0.5 MG tablet TAKE ONE TABLET BY MOUTH AT  BEDTIME. 90 tablet 0   cyanocobalamin (,VITAMIN B-12,) 1000 MCG/ML injection INJECT 1 ML INTO THE  MUSCLE ONCE MONTHLY AS DIRECTED. (Patient taking differently: Inject 1,000 mcg into the muscle every 30 (thirty) days. ) 1 mL 5   dexamethasone (DECADRON) 2 MG tablet Take 1 tablet (2 mg total) by mouth daily. (Patient taking differently: Take 1 mg by mouth daily. Taking 1/2 tablet) 30 tablet 3   ergocalciferol (VITAMIN D2) 1.25 MG (50000 UT) capsule Take by mouth.     gabapentin (NEURONTIN) 600 MG tablet Take 1 tablet (600 mg total) by mouth daily. 30 tablet 6   guaiFENesin (MUCINEX) 600 MG 12 hr tablet Take 1 tablet (600 mg total) by mouth 2 (two) times daily as needed for cough or to loosen phlegm.     ibandronate (BONIVA) 150 MG tablet Take 150 mg by mouth every 30 (thirty) days.      ibuprofen (ADVIL,MOTRIN) 800 MG tablet Take 800 mg by mouth every 8 (eight) hours as needed for moderate pain.      ipratropium (ATROVENT) 0.02 % nebulizer solution Take by nebulization.     ipratropium-albuterol (DUONEB) 0.5-2.5 (3) MG/3ML SOLN Take 3 mLs by nebulization every 6 (six) hours as needed (shortness of breath). 360 mL 0   methocarbamol (ROBAXIN) 500 MG tablet Take 500 mg by mouth every 6 (six) hours as needed for muscle spasms.      oxyCODONE (OXY IR/ROXICODONE) 5 MG immediate release tablet Take 1 tablet (5 mg total) by mouth every 8 (eight) hours. 90 tablet 0   OXYGEN Inhale 3 L into the lungs daily.     pantoprazole (PROTONIX) 40 MG tablet TAKE ONE TABLET BY MOUTH TWICE DAILY. 60 tablet 0   polyethylene glycol (MIRALAX / GLYCOLAX) 17 g packet Take 17 g by mouth daily as needed for mild constipation.     potassium chloride SA (KLOR-CON) 20 MEQ tablet Take 1 tablet (20 mEq total) by mouth 3 (three) times daily. (Patient taking differently: Take 20 mEq by mouth daily. ) 60 tablet 2   rizatriptan (MAXALT) 10 MG tablet Take 10 mg by mouth 2 (two) times daily. 1-2 times daily May repeat in 2  hours if needed     rosuvastatin (CRESTOR) 40 MG tablet Take 40 mg at bedtime by mouth.      traMADol (ULTRAM) 50 MG tablet Take 50 mg by mouth every 6 (six) hours as needed.     triamcinolone cream (KENALOG) 0.1 %      lidocaine-prilocaine (EMLA) cream Apply a small amount to port a cath site and cover with plastic wrap 1 hour prior to chemotherapy appointments (Patient not taking: Reported on 02/17/2020) 30 g 3   nitroGLYCERIN (NITRODUR - DOSED IN MG/24 HR) 0.1 mg/hr patch Place 0.1 mg onto the skin daily.  (Patient not taking: Reported on 02/17/2020)     ondansetron (ZOFRAN ODT) 8 MG disintegrating tablet Take 1 tablet (8 mg total) by mouth every 8 (eight) hours as needed for nausea or vomiting. (Patient not taking: Reported on 02/17/2020) 60 tablet 2   Current Facility-Administered Medications  Medication Dose Route Frequency Provider Last Rate Last Admin   influenza vaccine adjuvanted (FLUAD) injection 0.5 mL  0.5 mL Intramuscular Once Derek Jack, MD       Facility-Administered Medications Ordered in Other Visits  Medication Dose Route Frequency Provider Last Rate Last Admin   sodium chloride flush (NS) 0.9 % injection 10 mL  10 mL Intravenous PRN Lockamy, Randi L, NP-C   10 mL at 12/22/19 1240    ALLERGIES:  Allergies  Allergen Reactions  Penicillins Hives and Rash    Did it involve swelling of the face/tongue/throat, SOB, or low BP? No Did it involve sudden or severe rash/hives, skin peeling, or any reaction on the inside of your mouth or nose? No Did you need to seek medical attention at a hospital or doctor's office? No When did it last happen?5-10 year If all above answers are NO, may proceed with cephalosporin use.     Tape Rash    Medipore, Coban, and paper tape CAN be tolerated, Patient states that this can be removed     PHYSICAL EXAM:  Performance status (ECOG): 1 - Symptomatic but completely ambulatory  Vitals:   02/17/20 1303  BP: (!)  130/56  Pulse: 60  Resp: 16  Temp: (!) 97.2 F (36.2 C)  SpO2: 98%   Wt Readings from Last 3 Encounters:  02/17/20 90 lb 13.3 oz (41.2 kg)  01/26/20 95 lb 6.4 oz (43.3 kg)  01/05/20 97 lb 12.8 oz (44.4 kg)   Physical Exam Vitals reviewed.  Constitutional:      Appearance: Normal appearance.  Cardiovascular:     Rate and Rhythm: Normal rate and regular rhythm.     Pulses: Normal pulses.     Heart sounds: Normal heart sounds.  Pulmonary:     Effort: Pulmonary effort is normal.     Breath sounds: Normal breath sounds.  Chest:     Comments: Port-a-Cath in L chest Neurological:     General: No focal deficit present.     Mental Status: She is alert and oriented to person, place, and time.  Psychiatric:        Mood and Affect: Mood normal.        Behavior: Behavior normal.     LABORATORY DATA:  I have reviewed the labs as listed.  CBC Latest Ref Rng & Units 02/17/2020 01/26/2020 01/05/2020  WBC 4.0 - 10.5 K/uL 9.9 10.1 13.3(H)  Hemoglobin 12.0 - 15.0 g/dL 11.5(L) 11.1(L) 11.0(L)  Hematocrit 36 - 46 % 36.7 35.5(L) 35.0(L)  Platelets 150 - 400 K/uL 607(H) 605(H) 706(H)   CMP Latest Ref Rng & Units 02/17/2020 01/26/2020 01/05/2020  Glucose 70 - 99 mg/dL 85 130(H) 117(H)  BUN 8 - 23 mg/dL _0 Creatinine 0.44 - 1.00 mg/dL 0.85 1.07(H) 0.83  Sodium 135 - 145 mmol/L 137 138 136  Potassium 3.5 - 5.1 mmol/L 3.1(L) 3.4(L) 3.6  Chloride 98 - 111 mmol/L 103 102 104  CO2 22 - 32 mmol/L _1 Calcium 8.9 - 10.3 mg/dL 9.0 8.5(L) 8.8(L)  Total Protein 6.5 - 8.1 g/dL 6.7 6.4(L) 6.4(L)  Total Bilirubin 0.3 - 1.2 mg/dL 0.6 0.5 0.5  Alkaline Phos 38 - 126 U/L 62 65 69  AST 15 - 41 U/L _2 ALT 0 - 44 U/L _3 DIAGNOSTIC IMAGING:  I have independently reviewed the scans and discussed with the patient. NM PET Image Restag (PS) Skull Base To Thigh  Result Date: 02/12/2020 CLINICAL DATA:  Subsequent treatment strategy for small cell lung cancer. Status post right  VATS/lobectomy EXAM: NUCLEAR MEDICINE PET SKULL BASE TO THIGH TECHNIQUE: 5.4 mCi F-18 FDG was injected intravenously. Full-ring PET imaging was performed from the skull base to thigh after the radiotracer. CT data was obtained and used for attenuation correction and anatomic localization. Fasting blood glucose: 82 mg/dl COMPARISON:  PET-CT dated 11/24/2019 FINDINGS: Mediastinal blood pool activity: SUV max 2.1 Liver activity: SUV max NA NECK:  No hypermetabolic cervical lymphadenopathy. Incidental CT findings: none CHEST: Status post right upper lobectomy. Prior hypermetabolism in the right hilar region and anteriorly in the right hemithorax have resolved. No suspicious pulmonary nodules. No hypermetabolic thoracic lymphadenopathy. Incidental CT findings: Atherosclerotic calcifications of the aortic arch. Mild three-vessel coronary atherosclerosis. ABDOMEN/PELVIS: No abnormal hypermetabolism in the liver, spleen, pancreas, or adrenal glands. No hypermetabolic abdominopelvic lymphadenopathy. Incidental CT findings: Parenchymal pancreatic calcifications, reflecting sequela of prior/chronic pancreatitis. Stable bilateral renal nodules, non FDG avid. Bilateral renal cysts. Atherosclerotic calcifications of the abdominal aorta and branch vessels. SKELETON: No focal hypermetabolic activity to suggest skeletal metastasis. Incidental CT findings: Degenerative changes of the visualized thoracolumbar spine. Cervical spine fixation hardware. Lumbar spine fixation hardware. IMPRESSION: Status post right upper lobectomy. No evidence of recurrent or metastatic disease. Electronically Signed   By: Julian Hy M.D.   On: 02/12/2020 16:29     ASSESSMENT:  1. Stage III (T3N1) small cell lung cancer: -Right upper lobectomy on 12/30/2018, pathology-4.2 cm small cell lung cancer, invading visceral pleura, 1/3 lymph nodes positive, LVI positive, metastatic carcinoma in 211 or lymph nodes, right chest wall biopsy consistent with  small cell carcinoma. -30 Gy of radiation in 10 fractions from 01/28/2019 through 02/10/2019. -4 cycles of carboplatin and etoposide from 03/09/2019 through 05/11/2019. -PCI completed on 07/31/2019. -We reviewed MRI of the brain on 09/15/2019 with no evidence of brain meta stasis. Concern for hypointense appearance at the C3 vertebral body and left articular process. -CT chest on 09/15/2019 shows numerous new small solid irregular pulmonary nodules scattered throughout both lungs, largest 7 mm on the right lower lobe. Tiny loculated anterior right pleural effusion decreased. Stable mild subcarinal adenopathy. Bilateral stable adrenal adenomas. -MRI of the C-spine on 10/13/2019 showed marrow edema and enhancement involving C3 vertebral body and left articular process without discrete bone lesion. This was thought to be degenerative. -PET scan on 09/28/2019 shows bilateral lung nodules, below PET resolution. Small right-sided nodular densities demonstrating low-level hypermetabolism. Mild hypermetabolic corresponding to a normal-sized subcarinal lymph node. Multifocal right pleural hypermetabolism in the setting of pleural thickening and prior right upper lobectomy, indeterminate. No hypermetabolic extrathoracic disease. -PET scan on 11/24/2019 shows progressive increased soft tissue within the right lung along the suture margins, positive right paratracheal lymph node, small subcapsular focus of increased radiotracer uptake overlying the anterolateral right hepatic lobe concerning for tumor.   PLAN:  1. Stage III (T3N1) small cell lung cancer: -She has tolerated last cycle of chemotherapy reasonably well.  She had some tiredness for 4 to 5 days. -She did have occasional nausea but denied any vomiting. -Reviewed PET scan results which showed complete response.  Reviewed images with the patient and daughter.  She will continue chemotherapy until further progression or intolerance. -Labs from today  shows normal LFTs.  Albumin is low at 3.3.  Electrolytes show normal magnesium. -She will proceed with her chemotherapy at reduced dose of 2.6 mg/kg.  RTC 3 weeks.  2. Essential thrombocytosis: -Hydrea on hold since chemotherapy started for lung cancer.  Platelet count is 607 today.  Closely monitor.  3. Macrocytic anemia: -Hemoglobin is 11.5 with normal MCV.  4. Weight loss: -Continue dexamethasone 1 mg in the mornings.  5. Mid back and sternal pain: -Continue oxycodone 5 mg 1 tablet in the morning, 1 tablet in the afternoon and 2 tablets at bedtime.  6. Hypokalemia: -Potassium today is 3.1.  She will receive 40 mEq of potassium today.  Continue home potassium 20 mEq daily.  Orders placed this encounter:  No orders of the defined types were placed in this encounter.    Derek Jack, MD [place] [phone]   I, Milinda Antis, am acting as a scribe for Dr. Sanda Linger.  I, Derek Jack MD, have reviewed the above documentation for accuracy and completeness, and I agree with the above.

## 2020-02-17 NOTE — Patient Instructions (Signed)
[place] at [facility] Discharge Instructions  You were seen today by Dr. Delton Coombes. He went over your recent results. You received your treatment today. Eat calorie-rich meals to maintain your weight and energy levels. Dr. Delton Coombes will see you back in 3 weeks for labs and follow up.   Thank you for choosing [place] at [facility] to provide your oncology and hematology care.  To afford each patient quality time with our provider, please arrive at least 15 minutes before your scheduled appointment time.   If you have a lab appointment with the [place] please come in thru the Main Entrance and check in at the main information desk  You need to re-schedule your appointment should you arrive 10 or more minutes late.  We strive to give you quality time with our providers, and arriving late affects you and other patients whose appointments are after yours.  Also, if you no show three or more times for appointments you may be dismissed from the clinic at the providers discretion.     Again, thank you for choosing [facility].  Our hope is that these requests will decrease the amount of time that you wait before being seen by our physicians.       _____________________________________________________________  Should you have questions after your visit to [facility], please contact our office at [phone] between the hours of 8:00 a.m. and 4:30 p.m.  Voicemails left after 4:00 p.m. will not be returned until the following business day.  For prescription refill requests, have your pharmacy contact our office and allow 72 hours.    Cancer Center Support Programs:   > Cancer Support Group  2nd Tuesday of the month 1pm-2pm, Journey Room

## 2020-02-17 NOTE — Progress Notes (Signed)
Labs reviewed with Dr Raliegh Ip, K 3.1.  Potassium 40 meQ PO given. Okay for treatment.  Tolerated treatment well today without incidence.  Discharged via wheelchair in stable condition. Vital signs stable prior to discharge.

## 2020-02-17 NOTE — Patient Instructions (Signed)
[facility] at [facility] Discharge Instructions  Labs drawn from portacath today   Thank you for choosing [facility] at [facility] to provide your oncology and hematology care.  To afford each patient quality time with our provider, please arrive at least 15 minutes before your scheduled appointment time.   If you have a lab appointment with the Cancer Center please come in thru the Main Entrance and check in at the main information desk.  You need to re-schedule your appointment should you arrive 10 or more minutes late.  We strive to give you quality time with our providers, and arriving late affects you and other patients whose appointments are after yours.  Also, if you no show three or more times for appointments you may be dismissed from the clinic at the providers discretion.     Again, thank you for choosing [facility].  Our hope is that these requests will decrease the amount of time that you wait before being seen by our physicians.       _____________________________________________________________  Should you have questions after your visit to  Cancer Center, please contact our office at [phone] and follow the prompts.  Our office hours are 8:00 a.m. and 4:30 p.m. Monday - Friday.  Please note that voicemails left after 4:00 p.m. may not be returned until the following business day.  We are closed weekends and major holidays.  You do have access to a nurse 24-7, just call the main number to the clinic [phone] and do not press any options, hold on the line and a nurse will answer the phone.    For prescription refill requests, have your pharmacy contact our office and allow 72 hours.    Due to Covid, you will need to wear a mask upon entering the hospital. If you do not have a mask, a mask will be given to you at the Main Entrance upon arrival. For doctor visits, patients may have 1 support person age 18  or older with them. For treatment visits, patients can not have anyone with them due to social distancing guidelines and our immunocompromised population.     

## 2020-02-17 NOTE — Patient Instructions (Signed)
[facility] Discharge Instructions for Patients Receiving Chemotherapy  Today you received the following chemotherapy agents   To help prevent nausea and vomiting after your treatment, we encourage you to take your nausea medication   If you develop nausea and vomiting that is not controlled by your nausea medication, call the clinic.   BELOW ARE SYMPTOMS THAT SHOULD BE REPORTED IMMEDIATELY:  *FEVER GREATER THAN 100.5 F  *CHILLS WITH OR WITHOUT FEVER  NAUSEA AND VOMITING THAT IS NOT CONTROLLED WITH YOUR NAUSEA MEDICATION  *UNUSUAL SHORTNESS OF BREATH  *UNUSUAL BRUISING OR BLEEDING  TENDERNESS IN MOUTH AND THROAT WITH OR WITHOUT PRESENCE OF ULCERS  *URINARY PROBLEMS  *BOWEL PROBLEMS  UNUSUAL RASH Items with * indicate a potential emergency and should be followed up as soon as possible.  Feel free to call the clinic should you have any questions or concerns. The clinic phone number is [phone].  Please show the CHEMO ALERT CARD at check-in to the Emergency Department and triage nurse.   

## 2020-02-19 ENCOUNTER — Other Ambulatory Visit (HOSPITAL_COMMUNITY): Payer: Self-pay | Admitting: Oncology

## 2020-02-19 ENCOUNTER — Ambulatory Visit (HOSPITAL_COMMUNITY): Payer: Medicare Other

## 2020-02-19 MED ORDER — MIRTAZAPINE 15 MG PO TABS: 15.0000 mg | ORAL_TABLET | Freq: Every day | ORAL | 0 refills | Status: DC

## 2020-02-19 NOTE — Progress Notes (Signed)
Nutrition Follow-up:  Patient with lung cancer.  Patient is receiving chemotherapy.    Spoke with patient via phone and daughter. Patient reports that the last few days appetite has been poor as this is chemo week.  Reports nausea and that she is taking zofran.  Reports yesterday only able to eat baked potato.  Reports no appetite and does not feel hungry on weeks after treatment.  Reports most days it will be 2-3 pm before she has eaten anything.  Likes milkshakes and ice cream.  Does not like milky shakes or clear shakes.  Asking about a protein powder that is unflavored.     Medications: dexamethasone, Dtr reports she can't tolerate an increased dose  Labs: reviewed  Anthropometrics:   Weight 90 lb 13.3 oz decreased from 97 lb on 9/21   NUTRITION DIAGNOSIS: Inadequate oral intake continues   INTERVENTION:  Reviewed strategies to help increase calories and protein.  Will mail high calorie, high protein recipes for shakes to patient.   Discussed unflavored protein powder options with patient.   Daughter asking about appetite stimulant.  Says marinol was not approved by insurance.  Will send message to MD. Contact information provided    MONITORING, EVALUATION, GOAL: weight trends, intake   NEXT VISIT: Dec 3 phone f/u  Shant Hence B. Zenia Resides, [place], [place] Registered Dietitian [phone] (mobile)

## 2020-02-19 NOTE — Progress Notes (Signed)
Re: appetite stimulant  Mrs. Ashley [name] was unable to tolerate dexamethasone 1 mg secondary to "shakiness".  Unfortunately, Marinol was not covered by her insurance company.  Spoke with Dr. Delton Coombes who recommends Remeron 15 mg at bedtime.  Reluctant to try Megace secondary to increased risk for thrombus.  Prescription sent.  Faythe Casa, NP 02/19/2020 11:21 AM

## 2020-02-22 ENCOUNTER — Other Ambulatory Visit (HOSPITAL_COMMUNITY): Payer: Self-pay | Admitting: Hematology

## 2020-03-08 ENCOUNTER — Other Ambulatory Visit (HOSPITAL_COMMUNITY): Payer: Self-pay | Admitting: Hematology

## 2020-03-08 DIAGNOSIS — C50912 Malignant neoplasm of unspecified site of left female breast: Secondary | ICD-10-CM

## 2020-03-09 ENCOUNTER — Inpatient Hospital Stay (HOSPITAL_COMMUNITY): Payer: Medicare Other | Admitting: Hematology

## 2020-03-09 ENCOUNTER — Inpatient Hospital Stay (HOSPITAL_COMMUNITY): Payer: Medicare Other

## 2020-03-18 ENCOUNTER — Ambulatory Visit (HOSPITAL_COMMUNITY): Payer: Medicare Other

## 2020-03-18 NOTE — Progress Notes (Signed)
Nutrition Follow-up:  Patient with lung cancer.  Patient is receiving chemotherapy.    Spoke with patient via phone.  Patient reports that appetite is better after starting remeron.  However, she has back and left flank pain that last for about 4 days and comes every week or so. During this time when she is in pain does not eat much and feels nauseated.  Reports that she tried 2 of the different shake recipes that RD mailed to her and liked them.     Medications: reviewed  Labs: reviewed  Anthropometrics:   Weight on home scale per patient this am 95 lb 8 oz.  Says that she was up to 97 lb 4 oz on home scale.    90 lb 13.3 oz on 11/3.    NUTRITION DIAGNOSIS: Inadequate oral intake continues   INTERVENTION:  Continue remeron to help appetite. Encouraged patient to drink homemade shakes that she likes for added calories and protein.  Maximize calories and protein on good days.     MONITORING, EVALUATION, GOAL: weight trends, intake   NEXT VISIT: Jan 7, phone f/u  Ashley [name] B. Zenia Resides, [place], [place] Registered Dietitian [phone] (mobile)

## 2020-03-18 NOTE — Progress Notes (Signed)
[place] [address], [place] [address]   CLINIC:  Medical Oncology/Hematology  PCP:  [name], NP [address] / [place] [address] [phone]   REASON FOR VISIT:   Follow-up for right small cell lung cancer  PRIOR THERAPY:   1. Right upper lobectomy on 12/30/2018. 2. 30 Gy of radiation in 10 fractions from 01/28/2019 through 02/10/2019. 3. Carboplatin and etoposide x 4 cycles from 03/09/2019 through 05/11/2019.  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 The 3 liver lesions are not hypermetabolic. The radiologist suspects they may be subtly present on prior CT chest from 10/2013, to further reassuring that these are likely benign lesions.   Melanoma ([place])  07/09/2013 Initial Biopsy   Initial biopsy L upper thigh/buttocks melanoma   07/20/2013 Surgery   Excision L lateral buttock/upper thigh melanoma, clear margins   07/20/2013 Pathology Results   Breslow depth 1.02 mm, pT2a    Small cell lung carcinoma, right ([place])  11/27/2018 Initial Diagnosis   Small cell lung carcinoma, right ([place])   03/09/2019 - 05/15/2019 Chemotherapy   The patient had palonosetron (ALOXI) injection 0.25 mg, 0.25 mg, Intravenous,  Once, 4 of 4 cycles Administration: 0.25 mg (03/09/2019), 0.25 mg (03/30/2019), 0.25 mg (04/20/2019), 0.25 mg (05/11/2019) pegfilgrastim (NEULASTA ONPRO KIT) injection 6 mg,  6 mg, Subcutaneous, Once, 1 of 1 cycle Administration: 6 mg (03/11/2019) pegfilgrastim-jmdb (FULPHILA) injection 6 mg, 6 mg, Subcutaneous,  Once, 3 of 3 cycles Administration: 6 mg (04/03/2019), 6 mg (04/24/2019), 6 mg (05/15/2019) CARBOplatin (PARAPLATIN) 330 mg in sodium chloride 0.9 % 250 mL chemo infusion, 330 mg (100 % of original dose 325.5 mg), Intravenous,  Once, 4 of 4 cycles Dose modification:   (original dose 325.5 mg, Cycle 1),   (original dose 325.5 mg, Cycle 2),   (original dose 309 mg, Cycle 3),   (original dose 325.5 mg, Cycle 4) Administration: 330 mg (03/09/2019), 330 mg (03/30/2019), 310 mg (04/20/2019), 330 mg (05/11/2019) etoposide (VEPESID) 150 mg in sodium chloride 0.9 % 500 mL chemo infusion, 100 mg/m2 = 150 mg, Intravenous,  Once, 4 of 4 cycles Administration: 150 mg (03/09/2019), 150 mg (03/10/2019), 150 mg (03/11/2019), 150 mg (03/30/2019), 150 mg (03/31/2019), 150 mg (04/01/2019), 150 mg (04/20/2019), 150 mg (04/21/2019), 150 mg (04/22/2019), 150 mg (05/11/2019), 150 mg (05/12/2019), 150 mg (05/13/2019) fosaprepitant (EMEND) 150 mg, dexamethasone (DECADRON) 12 mg in sodium chloride 0.9 % 145 mL IVPB, , Intravenous,  Once, 4 of 4 cycles Administration:  (03/09/2019),  (03/30/2019),  (04/20/2019),  (05/11/2019)  for chemotherapy treatment.    03/09/2019 Cancer Staging   Staging form: Lung, AJCC 8th Edition - Clinical: Stage IIIA (cT3, cN1, cM0) - Signed by Derek Jack, MD on 03/09/2019   12/15/2019 -  Chemotherapy   The patient had palonosetron (ALOXI) injection 0.25 mg, 0.25 mg, Intravenous,  Once, 4 of 8 cycles Administration:  0.25 mg (12/15/2019), 0.25 mg (01/05/2020), 0.25 mg (01/26/2020), 0.25 mg (02/17/2020) lurbinectedin (ZEPZELCA) 3.65 mg in sodium chloride 0.9 % 250 mL chemo infusion, 2.6 mg/m2 = 3.65 mg (81.3 % of original dose 3.2 mg/m2), Intravenous,  Once, 4 of 8 cycles Dose modification: 2.6 mg/m2 (original dose 3.2 mg/m2, Cycle 1, Reason: Provider Judgment), 2.6 mg/m2  (original dose 3.2 mg/m2, Cycle 3, Reason: Provider Judgment) Administration: 3.65 mg (12/15/2019), 4.5 mg (01/05/2020), 3.65 mg (01/26/2020), 3.65 mg (02/17/2020)  for chemotherapy treatment.      CANCER STAGING: Cancer Staging Small cell lung carcinoma, right (HCC) Staging form: Lung, AJCC 8th Edition - Clinical: Stage IIIA (cT3, cN1, cM0) - Signed by Derek Jack, MD on 03/09/2019   INTERVAL HISTORY:  Ms. UNIQUE SILLAS, a 69 y.o. female, returns for routine follow-up and consideration for next cycle of chemotherapy.   Due for cycle # 5 of lurbinectedin and Aloxi today.  She is here with her daughter She reports nausea and fatigue which are her most bothersome side effects Remeron has been helping for the loss of appetite, will be refilled. SOB about the same. No change Headaches stable since radiation.  REVIEW OF SYSTEMS:  Review of Systems  Constitutional: Positive for appetite change, fatigue and unexpected weight change (about 2 lbs loss).  Respiratory: Positive for shortness of breath (on nebulizer) and wheezing.   Cardiovascular: Positive for chest pain (7/10 chest and back pain) and palpitations.  Gastrointestinal: Positive for constipation and nausea.  Neurological: Positive for headaches. Negative for dizziness and numbness.  All other systems reviewed and are negative.   PAST MEDICAL/SURGICAL HISTORY:  Past Medical History:  Diagnosis Date  . 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 Anginal pain ([place])   . Arthritis   . Ascending aortic aneurysm ([place])   . Cancer [facility]) 1995   breast, left, mastectomy/chemo  . Chest pain    Possibly cardiac. No evidence of ischemia/injury based upon normal troponin I. Chest discomfort could be tachycardia induced supply demand mismatch.   . CHF (congestive heart failure) ([place]) 11/17/2015   after surgery   . Colon adenomas   . Coronary artery disease   . DJD (degenerative joint disease)   . Dyspnea    with exertion   . Emphysema of lung ([place]) 11/17/2015  . Essential hypertension, benign   . GERD (gastroesophageal reflux disease)   . History of hiatal hernia   . Hyperlipidemia   . Hypertension   . Melanoma ([place]) 01/09/2016  . Osteopenia   . Palpitations   . Pernicious anemia 03/06/2016  . Pernicious anemia   . Pure hypercholesterolemia   . Raynaud's disease   . Thrombocythemia, essential ([place]) 01/09/2016  . Thrombocytosis    Idiopathic  . Vitamin D deficiency    Past Surgical History:  Procedure Laterality Date  . ABDOMINAL HYSTERECTOMY    . ANTERIOR AND POSTERIOR REPAIR N/A 12/09/2014   Procedure: ANTERIOR (CYSTOCELE) AND POSTERIOR REPAIR (RECTOCELE);  Surgeon: Bjorn Loser, MD;  Location: [facility];  Service: Urology;  Laterality: N/A;  . ANTERIOR CERVICAL DECOMPRESSION/DISCECTOMY FUSION 4 LEVELS Right 10/03/2016   Procedure: ANTERIOR CERVICAL DECOMPRESSION FUSION, CERVICAL 4-5, CERVICAL 5-6, CERVICAL 6-7, CERVICAL 7 TO THORACIC 1 WITH INSTRUMENTATION AND ALLOGRAFT;  Surgeon: Phylliss Bob, MD;  Location: [facility];  Service: Orthopedics;  Laterality: Right;  ANTERIOR CERVICAL DECOMPRESSION FUSION, CERVICAL 4-5, CERVICAL 5-6, CERVICAL 6-7, CERVICAL 7 TO THORACIC 1 WITH INSTRUMENTATION AND ALLOGRAFT; REQUEST 4 HO  . ANTERIOR LAT LUMBAR FUSION Left 11/16/2015  Procedure: LEFT SIDED LATERAL INTERBODY FUSION, LUMBAR 2-3, LUMBAR 3-4, LUMBAR 4-5 WITH INSTRUMENTATION;  Surgeon: Phylliss Bob, MD;  Location: [facility];  Service: Orthopedics;  Laterality: Left;  LEFT SIDED LATEARL INTERBODY FUSION, LUMBAR 2-3, LUMBAR 3-4, LUMBAR 4-5 WITH INSTRUMENTATION   . APPENDECTOMY    . BACK SURGERY    . BONE MARROW ASPIRATION  07/2012  . BONE MARROW BIOPSY  07/2012  . BREAST SURGERY    . CARDIAC CATHETERIZATION    . CARDIAC CATHETERIZATION N/A 01/20/2016   Procedure: Left Heart Cath and Coronary Angiography;  Surgeon: Burnell Blanks, MD;  Location: [facility];  Service: Cardiovascular;  Laterality: N/A;  .  COLONOSCOPY  11/29/2010   Procedure: COLONOSCOPY;  Surgeon: Rogene Houston, MD;  [facility];  Service: Endoscopy;  Laterality: N/A;  . COLONOSCOPY N/A 02/18/2014   Procedure: COLONOSCOPY;  Surgeon: Rogene Houston, MD;  [facility];  Service: Endoscopy;  Laterality: N/A;  1030  . COLONOSCOPY N/A 02/25/2017   Procedure: COLONOSCOPY;  Surgeon: Rogene Houston, MD;  [facility];  Service: Endoscopy;  Laterality: N/A;  10:55  . CYSTOSCOPY N/A 12/09/2014   Procedure: CYSTOSCOPY;  Surgeon: Bjorn Loser, MD;  Location: [facility];  Service: Urology;  Laterality: N/A;  . ESOPHAGEAL DILATION N/A 02/25/2017   Procedure: ESOPHAGEAL DILATION;  Surgeon: Rogene Houston, MD;  [facility];  Service: Endoscopy;  Laterality: N/A;  . ESOPHAGOGASTRODUODENOSCOPY N/A 02/25/2017   Procedure: ESOPHAGOGASTRODUODENOSCOPY (EGD);  Surgeon: Rogene Houston, MD;  [facility];  Service: Endoscopy;  Laterality: N/A;  . IR GENERIC HISTORICAL  01/11/2016   IR RADIOLOGY PERIPHERAL GUIDED IV START 01/11/2016 Saverio Danker, PA-C [facility]  . IR GENERIC HISTORICAL  01/11/2016   IR US GUIDE VASC ACCESS RIGHT 01/11/2016 Saverio Danker, PA-C [facility]  . LYMPH NODE DISSECTION Right 12/30/2018   Procedure: Lymph Node Dissection;  Surgeon: Lajuana Matte, MD;  Location: [facility];  Service: Thoracic;  Laterality: Right;  . MASTECTOMY     left  . OVARIAN CYST SURGERY     x2  . POLYPECTOMY  02/25/2017   Procedure: POLYPECTOMY;  Surgeon: Rogene Houston, MD;  [facility];  Service: Endoscopy;;  . PORTACATH PLACEMENT Left 02/16/2019   Procedure: INSERTION PORT-A-CATH (CATHETER  LEFT SUBCLAVIAN);  Surgeon: Aviva Signs, MD;  Location[facility];  Service: General;  Laterality: Left;  . SALPINGOOPHORECTOMY Bilateral 12/09/2014   Procedure: SALPINGO OOPHORECTOMY;  Surgeon: Servando Salina, MD;  Location: [facility];  Service: Gynecology;  Laterality: Bilateral;  .  TUBAL LIGATION    . VAGINAL HYSTERECTOMY N/A 12/09/2014   Procedure: HYSTERECTOMY VAGINAL;  Surgeon: Servando Salina, MD;  Location: [facility];  Service: Gynecology;  Laterality: N/A;  . VIDEO ASSISTED THORACOSCOPY (VATS)/ LOBECTOMY Right 12/30/2018   Procedure: VIDEO ASSISTED THORACOSCOPY (VATS)/RIGHT LOWER LOBE WEDGE RESECTION, RIGHT UPPER LOBECTOMY;  Surgeon: Lajuana Matte, MD;  Location: [facility];  Service: Thoracic;  Laterality: Right;  Marland Kitchen VIDEO BRONCHOSCOPY N/A 12/30/2018   Procedure: VIDEO BRONCHOSCOPY;  Surgeon: Lajuana Matte, MD;  [facility];  Service: Thoracic;  Laterality: N/A;    SOCIAL HISTORY:  Social History   Socioeconomic History  . Marital status: Divorced    Spouse name: Not on file  . Number of children: 2  . Years of education: Not on file  . Highest education level: Not on file  Occupational History  . Not on file  Tobacco Use  .  Smoking status: Former Smoker    Packs/day: 0.50    Years: 50.00    Pack years: 25.00    Types: Cigarettes    Quit date: 11/25/2018    Years since quitting: 1.3  . Smokeless tobacco: Never Used  Vaping Use  . Vaping Use: Never used  Substance and Sexual Activity  . Alcohol use: Yes    Comment: occasional  . Drug use: No  . Sexual activity: Not Currently    Birth control/protection: Post-menopausal  Other Topics Concern  . Not on file  Social History Narrative  . Not on file   Social Determinants of Health   Financial Resource Strain: Low Risk   . Difficulty of Paying Living Expenses: Not hard at all  Food Insecurity: No Food Insecurity  . Worried About Charity fundraiser in the Last Year: Never true  . Ran Out of Food in the Last Year: Never true  Transportation Needs: No Transportation Needs  . Lack of Transportation (Medical): No  . Lack of Transportation (Non-Medical): No  Physical Activity: Insufficiently Active  . Days of Exercise per Week: 4 days  . Minutes of Exercise per Session: 30 min  Stress:  No Stress Concern Present  . Feeling of Stress : Not at all  Social Connections: Moderately Isolated  . Frequency of Communication with Friends and Family: More than three times a week  . Frequency of Social Gatherings with Friends and Family: More than three times a week  . Attends Religious Services: More than 4 times per year  . Active Member of Clubs or Organizations: No  . Attends Archivist Meetings: Never  . Marital Status: Divorced  Human resources officer Violence: Not At Risk  . Fear of Current or Ex-Partner: No  . Emotionally Abused: No  . Physically Abused: No  . Sexually Abused: No    FAMILY HISTORY:  Family History  Problem Relation Age of Onset  . Hypertension Mother   . Heart failure Mother   . Congestive Heart Failure Mother   . COPD Mother   . Pernicious anemia Mother   . Cancer Mother        lung  . Hypertension Father   . CAD Father   . Heart attack Father   . Hypertension Sister   . Cancer Other   . Celiac disease Other     CURRENT MEDICATIONS:  Current Outpatient Medications  Medication Sig Dispense Refill  . albuterol (PROVENTIL HFA;VENTOLIN HFA) 108 (90 Base) MCG/ACT inhaler Inhale 2 puffs into the lungs every 6 (six) hours as needed for wheezing or shortness of breath. 1 Inhaler 2  . albuterol (PROVENTIL) (2.5 MG/3ML) 0.083% nebulizer solution Take 2.5 mg by nebulization every 6 (six) hours.    Marland Kitchen aspirin EC 81 MG tablet Take 81 mg by mouth daily.    . Calcium Carb-Cholecalciferol (CALCIUM 600+D) 600-800 MG-UNIT TABS Take 1 tablet by mouth 2 (two) times daily.    . Cholecalciferol (VITAMIN D-3) 1000 units CAPS Take 1,000 Units daily by mouth.     . clonazePAM (KLONOPIN) 0.5 MG tablet TAKE ONE TABLET BY MOUTH AT BEDTIME. 90 tablet 4  . cyanocobalamin (,VITAMIN B-12,) 1000 MCG/ML injection INJECT 1 ML INTO THE MUSCLE ONCE MONTHLY AS DIRECTED. (Patient taking differently: Inject 1,000 mcg into the muscle every 30 (thirty) days. ) 1 mL 5  .  dexamethasone (DECADRON) 2 MG tablet Take 1 tablet (2 mg total) by mouth daily. (Patient taking differently: Take 1 mg by mouth  daily. Taking 1/2 tablet) 30 tablet 3  . ergocalciferol (VITAMIN D2) 1.25 MG (50000 UT) capsule Take by mouth.    . gabapentin (NEURONTIN) 600 MG tablet Take 1 tablet (600 mg total) by mouth daily. 30 tablet 6  . guaiFENesin (MUCINEX) 600 MG 12 hr tablet Take 1 tablet (600 mg total) by mouth 2 (two) times daily as needed for cough or to loosen phlegm.    . ibandronate (BONIVA) 150 MG tablet Take 150 mg by mouth every 30 (thirty) days.     Marland Kitchen ibuprofen (ADVIL,MOTRIN) 800 MG tablet Take 800 mg by mouth every 8 (eight) hours as needed for moderate pain.     Marland Kitchen ipratropium (ATROVENT) 0.02 % nebulizer solution Take by nebulization.    Marland Kitchen ipratropium-albuterol (DUONEB) 0.5-2.5 (3) MG/3ML SOLN Take 3 mLs by nebulization every 6 (six) hours as needed (shortness of breath). 360 mL 0  . lidocaine-prilocaine (EMLA) cream Apply a small amount to port a cath site and cover with plastic wrap 1 hour prior to chemotherapy appointments (Patient not taking: Reported on 02/17/2020) 30 g 3  . methocarbamol (ROBAXIN) 500 MG tablet Take 500 mg by mouth every 6 (six) hours as needed for muscle spasms.     . mirtazapine (REMERON) 15 MG tablet Take 1 tablet (15 mg total) by mouth at bedtime. 30 tablet 0  . nitroGLYCERIN (NITRODUR - DOSED IN MG/24 HR) 0.1 mg/hr patch Place 0.1 mg onto the skin daily.  (Patient not taking: Reported on 02/17/2020)    . ondansetron (ZOFRAN ODT) 8 MG disintegrating tablet Take 1 tablet (8 mg total) by mouth every 8 (eight) hours as needed for nausea or vomiting. (Patient not taking: Reported on 02/17/2020) 60 tablet 2  . oxyCODONE (OXY IR/ROXICODONE) 5 MG immediate release tablet TAKE ONE TABLET BY MOUTH EVERY 8 HOURS 90 tablet 0  . OXYGEN Inhale 3 L into the lungs daily.    . pantoprazole (PROTONIX) 40 MG tablet TAKE ONE TABLET BY MOUTH TWICE DAILY. 60 tablet 0  .  polyethylene glycol (MIRALAX / GLYCOLAX) 17 g packet Take 17 g by mouth daily as needed for mild constipation.    . potassium chloride SA (KLOR-CON) 20 MEQ tablet Take 1 tablet (20 mEq total) by mouth 3 (three) times daily. (Patient taking differently: Take 20 mEq by mouth daily. ) 60 tablet 2  . rizatriptan (MAXALT) 10 MG tablet Take 10 mg by mouth 2 (two) times daily. 1-2 times daily May repeat in 2 hours if needed    . rosuvastatin (CRESTOR) 40 MG tablet Take 40 mg at bedtime by mouth.     . traMADol (ULTRAM) 50 MG tablet Take 50 mg by mouth every 6 (six) hours as needed.    . triamcinolone cream (KENALOG) 0.1 %      No current facility-administered medications for this visit.   Facility-Administered Medications Ordered in Other Visits  Medication Dose Route Frequency Provider Last Rate Last Admin  . sodium chloride flush (NS) 0.9 % injection 10 mL  10 mL Intravenous PRN Lockamy, Randi L, NP-C   10 mL at 12/22/19 1240    ALLERGIES:  Allergies  Allergen Reactions  . Penicillins Hives and Rash    Did it involve swelling of the face/tongue/throat, SOB, or low BP? No Did it involve sudden or severe rash/hives, skin peeling, or any reaction on the inside of your mouth or nose? No Did you need to seek medical attention at a hospital or doctor's office? No When did it  last happen?5-10 year If all above answers are "NO", may proceed with cephalosporin use.    . Tape Rash    Medipore, Coban, and paper tape CAN be tolerated, Patient states that this can be removed     PHYSICAL EXAM:  Performance status (ECOG): 1 - Symptomatic but completely ambulatory  There were no vitals filed for this visit. Wt Readings from Last 3 Encounters:  02/17/20 90 lb 13.3 oz (41.2 kg)  01/26/20 95 lb 6.4 oz (43.3 kg)  01/05/20 97 lb 12.8 oz (44.4 kg)   Physical Exam Vitals reviewed.  Constitutional:      Appearance: Normal appearance.     Comments: Frail, elderly in wheel chair   Cardiovascular:      Rate and Rhythm: Normal rate and regular rhythm.     Pulses: Normal pulses.     Heart sounds: Normal heart sounds.  Pulmonary:     Effort: Pulmonary effort is normal.     Breath sounds: Normal breath sounds.  Chest:     Comments: Port-a-Cath in L chest Neurological:     General: No focal deficit present.     Mental Status: She is alert and oriented to person, place, and time.  Psychiatric:        Mood and Affect: Mood normal.        Behavior: Behavior normal.     LABORATORY DATA:  I have reviewed the labs as listed.  CBC Latest Ref Rng & Units 02/17/2020 01/26/2020 01/05/2020  WBC 4.0 - 10.5 K/uL 9.9 10.1 13.3(H)  Hemoglobin 12.0 - 15.0 g/dL 11.5(L) 11.1(L) 11.0(L)  Hematocrit 36 - 46 % 36.7 35.5(L) 35.0(L)  Platelets 150 - 400 K/uL 607(H) 605(H) 706(H)   CMP Latest Ref Rng & Units 02/17/2020 01/26/2020 01/05/2020  Glucose 70 - 99 mg/dL 85 130(H) 117(H)  BUN 8 - 23 mg/dL '15 18 14  ' Creatinine 0.44 - 1.00 mg/dL 0.85 1.07(H) 0.83  Sodium 135 - 145 mmol/L 137 138 136  Potassium 3.5 - 5.1 mmol/L 3.1(L) 3.4(L) 3.6  Chloride 98 - 111 mmol/L 103 102 104  CO2 22 - 32 mmol/L '25 27 23  ' Calcium 8.9 - 10.3 mg/dL 9.0 8.5(L) 8.8(L)  Total Protein 6.5 - 8.1 g/dL 6.7 6.4(L) 6.4(L)  Total Bilirubin 0.3 - 1.2 mg/dL 0.6 0.5 0.5  Alkaline Phos 38 - 126 U/L 62 65 69  AST 15 - 41 U/L '23 19 20  ' ALT 0 - 44 U/L '13 14 12    ' DIAGNOSTIC IMAGING:  I have independently reviewed the scans and discussed with the patient. No results found.   ASSESSMENT:  1. Extensive stage small cell lung cancer -Right upper lobectomy on 12/30/2018, pathology-4.2 cm small cell lung cancer, invading visceral pleura, 1/3 lymph nodes positive, LVI positive, metastatic carcinoma in 211 or lymph nodes, right chest wall biopsy consistent with small cell carcinoma. -30 Gy of radiation in 10 fractions from 01/28/2019 through 02/10/2019. -4 cycles of carboplatin and etoposide from 03/09/2019 through 05/11/2019. -PCI completed  on 07/31/2019. -We reviewed MRI of the brain on 09/15/2019 with no evidence of brain meta stasis. Concern for hypointense appearance at the C3 vertebral body and left articular process. -CT chest on 09/15/2019 shows numerous new small solid irregular pulmonary nodules scattered throughout both lungs, largest 7 mm on the right lower lobe. Tiny loculated anterior right pleural effusion decreased. Stable mild subcarinal adenopathy. Bilateral stable adrenal adenomas. -MRI of the C-spine on 10/13/2019 showed marrow edema and enhancement involving C3 vertebral body and left  articular process without discrete bone lesion. This was thought to be degenerative. -PET scan on 09/28/2019 shows bilateral lung nodules, below PET resolution. Small right-sided nodular densities demonstrating low-level hypermetabolism. Mild hypermetabolic corresponding to a normal-sized subcarinal lymph node. Multifocal right pleural hypermetabolism in the setting of pleural thickening and prior right upper lobectomy, indeterminate. No hypermetabolic extrathoracic disease. -PET scan on 11/24/2019 shows progressive increased soft tissue within the right lung along the suture margins, positive right paratracheal lymph node, small subcapsular focus of increased radiotracer uptake overlying the anterolateral right hepatic lobe concerning for tumor.   PLAN:  1. Extensive stage SCLC - She was started on lubernectedin given concern for recurrence and metastatic disease.. -Last PET scan results which showed complete response.  She will continue chemotherapy until further progression or intolerance per Dr Delton Coombes -Labs from today ok to proceed with treatment. Will add 2 gms of Mag IV  -She will proceed with her chemotherapy at reduced dose of 2.6 mg/kg.  RTC 3 weeks.  2. Essential thrombocytosis: -Hydrea on hold since chemotherapy started for lung cancer.  Platelet count is 484 today.  Closely monitor.  3. Weight loss: -Continue  dexamethasone 1 mg in the mornings and remeron.  4. Mid back and sternal pain: -Continue oxycodone 5 mg 1 tablet in the morning, 1 tablet in the afternoon and 2 tablets at bedtime.  5. Hypomagnesemia 2 gms of Mg IV today   Orders placed this encounter:  No orders of the defined types were placed in this encounter.

## 2020-03-21 ENCOUNTER — Inpatient Hospital Stay (HOSPITAL_BASED_OUTPATIENT_CLINIC_OR_DEPARTMENT_OTHER): Payer: Medicare Other | Admitting: Hematology and Oncology

## 2020-03-21 ENCOUNTER — Encounter (HOSPITAL_COMMUNITY): Payer: Self-pay | Admitting: Hematology and Oncology

## 2020-03-21 ENCOUNTER — Inpatient Hospital Stay (HOSPITAL_COMMUNITY): Payer: Medicare Other

## 2020-03-21 ENCOUNTER — Inpatient Hospital Stay (HOSPITAL_COMMUNITY): Payer: Medicare Other | Attending: Hematology

## 2020-03-21 ENCOUNTER — Other Ambulatory Visit (HOSPITAL_COMMUNITY): Payer: Self-pay | Admitting: *Deleted

## 2020-03-21 ENCOUNTER — Other Ambulatory Visit: Payer: Self-pay

## 2020-03-21 VITALS — BP 112/66 | HR 79 | Temp 97.0°F | Resp 16 | Wt 92.8 lb

## 2020-03-21 VITALS — BP 105/61 | HR 67 | Temp 96.9°F | Resp 16

## 2020-03-21 DIAGNOSIS — C3411 Malignant neoplasm of upper lobe, right bronchus or lung: Secondary | ICD-10-CM | POA: Diagnosis present

## 2020-03-21 DIAGNOSIS — C3491 Malignant neoplasm of unspecified part of right bronchus or lung: Secondary | ICD-10-CM

## 2020-03-21 DIAGNOSIS — Z79899 Other long term (current) drug therapy: Secondary | ICD-10-CM | POA: Diagnosis not present

## 2020-03-21 DIAGNOSIS — Z5111 Encounter for antineoplastic chemotherapy: Secondary | ICD-10-CM | POA: Insufficient documentation

## 2020-03-21 DIAGNOSIS — Z923 Personal history of irradiation: Secondary | ICD-10-CM | POA: Diagnosis not present

## 2020-03-21 LAB — COMPREHENSIVE METABOLIC PANEL
ALT: 13 U/L (ref 0–44)
AST: 20 U/L (ref 15–41)
Albumin: 3.4 g/dL — ABNORMAL LOW (ref 3.5–5.0)
Alkaline Phosphatase: 58 U/L (ref 38–126)
Anion gap: 6 (ref 5–15)
BUN: 14 mg/dL (ref 8–23)
CO2: 26 mmol/L (ref 22–32)
Calcium: 8.8 mg/dL — ABNORMAL LOW (ref 8.9–10.3)
Chloride: 104 mmol/L (ref 98–111)
Creatinine, Ser: 0.89 mg/dL (ref 0.44–1.00)
GFR, Estimated: >60 mL/min (ref >60)
Glucose, Bld: 116 mg/dL — ABNORMAL HIGH (ref 70–99)
Potassium: 3.5 mmol/L (ref 3.5–5.1)
Sodium: 136 mmol/L (ref 135–145)
Total Bilirubin: 0.5 mg/dL (ref 0.3–1.2)
Total Protein: 6.4 g/dL — ABNORMAL LOW (ref 6.5–8.1)

## 2020-03-21 LAB — MAGNESIUM: Magnesium: 1.8 mg/dL (ref 1.7–2.4)

## 2020-03-21 LAB — CBC WITH DIFFERENTIAL/PLATELET
Abs Immature Granulocytes: 0.02 10*3/uL (ref 0.00–0.07)
Basophils Absolute: 0 10*3/uL (ref 0.0–0.1)
Basophils Relative: 0 %
Eosinophils Absolute: 0.2 10*3/uL (ref 0.0–0.5)
Eosinophils Relative: 3 %
HCT: 36.7 % (ref 36.0–46.0)
Hemoglobin: 11.5 g/dL — ABNORMAL LOW (ref 12.0–15.0)
Immature Granulocytes: 0 %
Lymphocytes Relative: 16 %
Lymphs Abs: 1.5 10*3/uL (ref 0.7–4.0)
MCH: 28.4 pg (ref 26.0–34.0)
MCHC: 31.3 g/dL (ref 30.0–36.0)
MCV: 90.6 fL (ref 80.0–100.0)
Monocytes Absolute: 0.7 10*3/uL (ref 0.1–1.0)
Monocytes Relative: 8 %
Neutro Abs: 6.5 10*3/uL (ref 1.7–7.7)
Neutrophils Relative %: 73 %
Platelets: 484 10*3/uL — ABNORMAL HIGH (ref 150–400)
RBC: 4.05 MIL/uL (ref 3.87–5.11)
RDW: 16 % — ABNORMAL HIGH (ref 11.5–15.5)
WBC: 9 10*3/uL (ref 4.0–10.5)
nRBC: 0 % (ref 0.0–0.2)

## 2020-03-21 MED ORDER — SODIUM CHLORIDE 0.9 % IV SOLN
2.6000 mg/m2 | Freq: Once | INTRAVENOUS | Status: AC
  Administered 2020-03-21: 3.65 mg via INTRAVENOUS
  Filled 2020-03-21: qty 7.3

## 2020-03-21 MED ORDER — SODIUM CHLORIDE 0.9% FLUSH
10.0000 mL | INTRAVENOUS | Status: DC | PRN
  Administered 2020-03-21: 10 mL

## 2020-03-21 MED ORDER — MIRTAZAPINE 15 MG PO TABS
15.0000 mg | ORAL_TABLET | Freq: Every day | ORAL | 0 refills | Status: DC
Start: 2020-03-21 — End: 2020-04-12

## 2020-03-21 MED ORDER — HEPARIN SOD (PORK) LOCK FLUSH 100 UNIT/ML IV SOLN
500.0000 [IU] | Freq: Once | INTRAVENOUS | Status: AC | PRN
  Administered 2020-03-21: 500 [IU]

## 2020-03-21 MED ORDER — SODIUM CHLORIDE 0.9 % IV SOLN
10.0000 mg | Freq: Once | INTRAVENOUS | Status: AC
  Administered 2020-03-21: 10 mg via INTRAVENOUS
  Filled 2020-03-21: qty 10

## 2020-03-21 MED ORDER — MAGNESIUM SULFATE 2 GM/50ML IV SOLN
2.0000 g | Freq: Once | INTRAVENOUS | Status: AC
  Administered 2020-03-21: 2 g via INTRAVENOUS
  Filled 2020-03-21: qty 50

## 2020-03-21 MED ORDER — PALONOSETRON HCL INJECTION 0.25 MG/5ML
0.2500 mg | Freq: Once | INTRAVENOUS | Status: AC
  Administered 2020-03-21: 0.25 mg via INTRAVENOUS
  Filled 2020-03-21: qty 5

## 2020-03-21 MED ORDER — SODIUM CHLORIDE 0.9 % IV SOLN: Freq: Once | INTRAVENOUS | Status: AC

## 2020-03-21 NOTE — Progress Notes (Signed)
Patient was assessed by Dr. Chryl Heck and labs have been reviewed.  Orders received for magnesium 2 gm today with treatment, patient is okay to proceed with treatment today. Primary RN and pharmacy aware.

## 2020-03-21 NOTE — Progress Notes (Signed)
Pt here for D1C5. Labs reviewed with Dr Meda Coffee. Faythe Ghee for treatment today.  Magnesium 2 g given.  Tolerated treatment today without incidence.  Vital signs stable prior to discharge.  Discharged in stable condition via wheelchair.

## 2020-03-21 NOTE — Patient Instructions (Signed)
[facility] Discharge Instructions for Patients Receiving Chemotherapy  Today you received the following chemotherapy agents   To help prevent nausea and vomiting after your treatment, we encourage you to take your nausea medication   If you develop nausea and vomiting that is not controlled by your nausea medication, call the clinic.   BELOW ARE SYMPTOMS THAT SHOULD BE REPORTED IMMEDIATELY:  *FEVER GREATER THAN 100.5 F  *CHILLS WITH OR WITHOUT FEVER  NAUSEA AND VOMITING THAT IS NOT CONTROLLED WITH YOUR NAUSEA MEDICATION  *UNUSUAL SHORTNESS OF BREATH  *UNUSUAL BRUISING OR BLEEDING  TENDERNESS IN MOUTH AND THROAT WITH OR WITHOUT PRESENCE OF ULCERS  *URINARY PROBLEMS  *BOWEL PROBLEMS  UNUSUAL RASH Items with * indicate a potential emergency and should be followed up as soon as possible.  Feel free to call the clinic should you have any questions or concerns. The clinic phone number is [phone].  Please show the CHEMO ALERT CARD at check-in to the Emergency Department and triage nurse.   

## 2020-03-21 NOTE — Patient Instructions (Signed)
[place] at [facility] Discharge Instructions  You were seen today by Dr. Chryl Heck.  She is covering for Dr. Delton Coombes.  You will get your treatment today and follow up with [place] in 3 weeks as scheduled.    Thank you for choosing [place] at [facility] to provide your oncology and hematology care.  To afford each patient quality time with our provider, please arrive at least 15 minutes before your scheduled appointment time.   If you have a lab appointment with the [place] please come in thru the Main Entrance and check in at the main information desk.  You need to re-schedule your appointment should you arrive 10 or more minutes late.  We strive to give you quality time with our providers, and arriving late affects you and other patients whose appointments are after yours.  Also, if you no show three or more times for appointments you may be dismissed from the clinic at the providers discretion.     Again, thank you for choosing [facility].  Our hope is that these requests will decrease the amount of time that you wait before being seen by our physicians.       _____________________________________________________________  Should you have questions after your visit to [facility], please contact our office at [phone] and follow the prompts.  Our office hours are 8:00 a.m. and 4:30 p.m. Monday - Friday.  Please note that voicemails left after 4:00 p.m. may not be returned until the following business day.  We are closed weekends and major holidays.  You do have access to a nurse 24-7, just call the main number to the clinic [phone] and do not press any options, hold on the line and a nurse will answer the phone.    For prescription refill requests, have your pharmacy contact our office and allow 72 hours.    Due to Covid, you will need to wear a mask upon entering the hospital. If you do not have a mask, a  mask will be given to you at the Main Entrance upon arrival. For doctor visits, patients may have 1 support person age 26 or older with them. For treatment visits, patients can not have anyone with them due to social distancing guidelines and our immunocompromised population.

## 2020-04-11 NOTE — Progress Notes (Signed)
[address] [place], [place] [address]   CLINIC:  Medical Oncology/Hematology  PCP:  [name], NP [address] / [place] [address] [phone]   REASON FOR VISIT:  Follow-up for right small cell lung cancer  PRIOR THERAPY:  1. Right upper lobectomy on 12/30/2018. 2. 30 Gy of radiation in 10 fractions from 01/28/2019 through 02/10/2019. 3. Carboplatin and etoposide x 4 cycles from 03/09/2019 through 05/11/2019.  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 The 3 liver lesions are not hypermetabolic. The radiologist suspects they may be subtly present on prior CT chest from 10/2013, to further reassuring that these are likely benign lesions.   Melanoma ([place])  07/09/2013 Initial Biopsy   Initial biopsy L upper thigh/buttocks melanoma   07/20/2013 Surgery   Excision L lateral buttock/upper thigh melanoma, clear margins   07/20/2013 Pathology Results   Breslow depth 1.02 mm, pT2a    Small cell lung carcinoma, right ([place])  11/27/2018 Initial Diagnosis   Small cell lung carcinoma, right ([place])   03/09/2019 - 05/15/2019 Chemotherapy   The patient had palonosetron (ALOXI) injection 0.25 mg, 0.25 mg, Intravenous,  Once, 4 of 4 cycles Administration: 0.25 mg (03/09/2019), 0.25 mg (03/30/2019), 0.25 mg (04/20/2019), 0.25 mg (05/11/2019) pegfilgrastim (NEULASTA ONPRO KIT) injection 6 mg, 6  mg, Subcutaneous, Once, 1 of 1 cycle Administration: 6 mg (03/11/2019) pegfilgrastim-jmdb (FULPHILA) injection 6 mg, 6 mg, Subcutaneous,  Once, 3 of 3 cycles Administration: 6 mg (04/03/2019), 6 mg (04/24/2019), 6 mg (05/15/2019) CARBOplatin (PARAPLATIN) 330 mg in sodium chloride 0.9 % 250 mL chemo infusion, 330 mg (100 % of original dose 325.5 mg), Intravenous,  Once, 4 of 4 cycles Dose modification:   (original dose 325.5 mg, Cycle 1),   (original dose 325.5 mg, Cycle 2),   (original dose 309 mg, Cycle 3),   (original dose 325.5 mg, Cycle 4) Administration: 330 mg (03/09/2019), 330 mg (03/30/2019), 310 mg (04/20/2019), 330 mg (05/11/2019) etoposide (VEPESID) 150 mg in sodium chloride 0.9 % 500 mL chemo infusion, 100 mg/m2 = 150 mg, Intravenous,  Once, 4 of 4 cycles Administration: 150 mg (03/09/2019), 150 mg (03/10/2019), 150 mg (03/11/2019), 150 mg (03/30/2019), 150 mg (03/31/2019), 150 mg (04/01/2019), 150 mg (04/20/2019), 150 mg (04/21/2019), 150 mg (04/22/2019), 150 mg (05/11/2019), 150 mg (05/12/2019), 150 mg (05/13/2019) fosaprepitant (EMEND) 150 mg, dexamethasone (DECADRON) 12 mg in sodium chloride 0.9 % 145 mL IVPB, , Intravenous,  Once, 4 of 4 cycles Administration:  (03/09/2019),  (03/30/2019),  (04/20/2019),  (05/11/2019)  for chemotherapy treatment.    03/09/2019 Cancer Staging   Staging form: Lung, AJCC 8th Edition - Clinical: Stage IIIA (cT3, cN1, cM0) - Signed by Derek Jack, MD on 03/09/2019   12/15/2019 -  Chemotherapy   The patient had palonosetron (ALOXI) injection 0.25 mg, 0.25 mg, Intravenous,  Once, 5 of 8 cycles Administration: 0.25 mg (  12/15/2019), 0.25 mg (01/05/2020), 0.25 mg (01/26/2020), 0.25 mg (02/17/2020), 0.25 mg (03/21/2020) lurbinectedin (ZEPZELCA) 3.65 mg in sodium chloride 0.9 % 250 mL chemo infusion, 2.6 mg/m2 = 3.65 mg (81.3 % of original dose 3.2 mg/m2), Intravenous,  Once, 5 of 8 cycles Dose modification: 2.6 mg/m2 (original dose 3.2 mg/m2, Cycle 1, Reason: Provider  Judgment), 2.6 mg/m2 (original dose 3.2 mg/m2, Cycle 3, Reason: Provider Judgment) Administration: 3.65 mg (12/15/2019), 4.5 mg (01/05/2020), 3.65 mg (01/26/2020), 3.65 mg (02/17/2020), 3.65 mg (03/21/2020)  for chemotherapy treatment.      CANCER STAGING: Cancer Staging Small cell lung carcinoma, right (HCC) Staging form: Lung, AJCC 8th Edition - Clinical: Stage IIIA (cT3, cN1, cM0) - Signed by Derek Jack, MD on 03/09/2019   INTERVAL HISTORY:  Ms. NOHEMY KOOP, a 69 y.o. female, seen for follow-up and next cycle of chemotherapy.  Complaint of bilateral leg pains which started about 4 to 5 weeks ago.  Reports that the pain comes in waves.  She is taking oxycodone for her back pain which is also helping with the leg pains.  Denied any vomiting but has occasional nausea.  Also has tiredness lasting few days after each cycle of chemotherapy.  Reports that Remeron is helping her appetite.  REVIEW OF SYSTEMS:  Review of Systems  Respiratory: Positive for shortness of breath.   Gastrointestinal: Positive for constipation and nausea.  Neurological: Positive for headaches.  All other systems reviewed and are negative.   PAST MEDICAL/SURGICAL HISTORY:  Past Medical History:  Diagnosis Date  . 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 Anginal pain ([place])   . Arthritis   . Ascending aortic aneurysm ([place])   . Cancer [facility]) 1995   breast, left, mastectomy/chemo  . Chest pain    Possibly cardiac. No evidence of ischemia/injury based upon normal troponin I. Chest discomfort could be tachycardia induced supply demand mismatch.   . CHF (congestive heart failure) ([place]) 11/17/2015   after surgery   . Colon adenomas   . Coronary artery disease   . DJD (degenerative joint disease)   . Dyspnea    with exertion  . Emphysema of lung ([place]) 11/17/2015  . Essential hypertension, benign   . GERD (gastroesophageal reflux disease)   . History of hiatal hernia   . Hyperlipidemia   .  Hypertension   . Melanoma ([place]) 01/09/2016  . Osteopenia   . Palpitations   . Pernicious anemia 03/06/2016  . Pernicious anemia   . Pure hypercholesterolemia   . Raynaud's disease   . Thrombocythemia, essential ([place]) 01/09/2016  . Thrombocytosis    Idiopathic  . Vitamin D deficiency    Past Surgical History:  Procedure Laterality Date  . ABDOMINAL HYSTERECTOMY    . ANTERIOR AND POSTERIOR REPAIR N/A 12/09/2014   Procedure: ANTERIOR (CYSTOCELE) AND POSTERIOR REPAIR (RECTOCELE);  Surgeon: Bjorn Loser, MD;  Location: [facility];  Service: Urology;  Laterality: N/A;  . ANTERIOR CERVICAL DECOMPRESSION/DISCECTOMY FUSION 4 LEVELS Right 10/03/2016   Procedure: ANTERIOR CERVICAL DECOMPRESSION FUSION, CERVICAL 4-5, CERVICAL 5-6, CERVICAL 6-7, CERVICAL 7 TO THORACIC 1 WITH INSTRUMENTATION AND ALLOGRAFT;  Surgeon: Phylliss Bob, MD;  Location: [facility];  Service: Orthopedics;  Laterality: Right;  ANTERIOR CERVICAL DECOMPRESSION FUSION, CERVICAL 4-5, CERVICAL 5-6, CERVICAL 6-7, CERVICAL 7 TO THORACIC 1 WITH INSTRUMENTATION AND ALLOGRAFT; REQUEST 4 HO  . ANTERIOR LAT LUMBAR FUSION Left 11/16/2015   Procedure: LEFT SIDED LATERAL INTERBODY FUSION, LUMBAR 2-3, LUMBAR 3-4, LUMBAR 4-5 WITH INSTRUMENTATION;  Surgeon: Phylliss Bob, MD;  Location:  [facility];  Service: Orthopedics;  Laterality: Left;  LEFT SIDED LATEARL INTERBODY FUSION, LUMBAR 2-3, LUMBAR 3-4, LUMBAR 4-5 WITH INSTRUMENTATION   . APPENDECTOMY    . BACK SURGERY    . BONE MARROW ASPIRATION  07/2012  . BONE MARROW BIOPSY  07/2012  . BREAST SURGERY    . CARDIAC CATHETERIZATION    . CARDIAC CATHETERIZATION N/A 01/20/2016   Procedure: Left Heart Cath and Coronary Angiography;  Surgeon: Burnell Blanks, MD;  Location: [facility];  Service: Cardiovascular;  Laterality: N/A;  . COLONOSCOPY  11/29/2010   Procedure: COLONOSCOPY;  Surgeon: Rogene Houston, MD;  [facility];  Service: Endoscopy;  Laterality: N/A;  . COLONOSCOPY N/A 02/18/2014    Procedure: COLONOSCOPY;  Surgeon: Rogene Houston, MD;  [facility];  Service: Endoscopy;  Laterality: N/A;  1030  . COLONOSCOPY N/A 02/25/2017   Procedure: COLONOSCOPY;  Surgeon: Rogene Houston, MD;  [facility];  Service: Endoscopy;  Laterality: N/A;  10:55  . CYSTOSCOPY N/A 12/09/2014   Procedure: CYSTOSCOPY;  Surgeon: Bjorn Loser, MD;  Location: [facility];  Service: Urology;  Laterality: N/A;  . ESOPHAGEAL DILATION N/A 02/25/2017   Procedure: ESOPHAGEAL DILATION;  Surgeon: Rogene Houston, MD;  [facility];  Service: Endoscopy;  Laterality: N/A;  . ESOPHAGOGASTRODUODENOSCOPY N/A 02/25/2017   Procedure: ESOPHAGOGASTRODUODENOSCOPY (EGD);  Surgeon: Rogene Houston, MD;  [facility];  Service: Endoscopy;  Laterality: N/A;  . IR GENERIC HISTORICAL  01/11/2016   IR RADIOLOGY PERIPHERAL GUIDED IV START 01/11/2016 Saverio Danker, PA-C [facility]  . IR GENERIC HISTORICAL  01/11/2016   IR US GUIDE VASC ACCESS RIGHT 01/11/2016 Saverio Danker, PA-C [facility]  . LYMPH NODE DISSECTION Right 12/30/2018   Procedure: Lymph Node Dissection;  Surgeon: Lajuana Matte, MD;  Location: [facility];  Service: Thoracic;  Laterality: Right;  . MASTECTOMY     left  . OVARIAN CYST SURGERY     x2  . POLYPECTOMY  02/25/2017   Procedure: POLYPECTOMY;  Surgeon: Rogene Houston, MD;  [facility];  Service: Endoscopy;;  . PORTACATH PLACEMENT Left 02/16/2019   Procedure: INSERTION PORT-A-CATH (CATHETER  LEFT SUBCLAVIAN);  Surgeon: Aviva Signs, MD;  Location: [facility];  Service: General;  Laterality: Left;  . SALPINGOOPHORECTOMY Bilateral 12/09/2014   Procedure: SALPINGO OOPHORECTOMY;  Surgeon: Servando Salina, MD;  Location: [facility];  Service: Gynecology;  Laterality: Bilateral;  . TUBAL LIGATION    . VAGINAL HYSTERECTOMY N/A 12/09/2014   Procedure: HYSTERECTOMY VAGINAL;  Surgeon: Servando Salina, MD;  Location: [facility];  Service: Gynecology;   Laterality: N/A;  . VIDEO ASSISTED THORACOSCOPY (VATS)/ LOBECTOMY Right 12/30/2018   Procedure: VIDEO ASSISTED THORACOSCOPY (VATS)/RIGHT LOWER LOBE WEDGE RESECTION, RIGHT UPPER LOBECTOMY;  Surgeon: Lajuana Matte, MD;  Location: [facility];  Service: Thoracic;  Laterality: Right;  Marland Kitchen VIDEO BRONCHOSCOPY N/A 12/30/2018   Procedure: VIDEO BRONCHOSCOPY;  Surgeon: Lajuana Matte, MD;  Location: [facility];  Service: Thoracic;  Laterality: N/A;    SOCIAL HISTORY:  Social History   Socioeconomic History  . Marital status: Divorced    Spouse name: Not on file  . Number of children: 2  . Years of education: Not on file  . Highest education level: Not on file  Occupational History  . Not on file  Tobacco Use  . Smoking status: Former Smoker    Packs/day: 0.50    Years: 50.00    Pack years:  25.00    Types: Cigarettes    Quit date: 11/25/2018    Years since quitting: 1.3  . Smokeless tobacco: Never Used  Vaping Use  . Vaping Use: Never used  Substance and Sexual Activity  . Alcohol use: Yes    Comment: occasional  . Drug use: No  . Sexual activity: Not Currently    Birth control/protection: Post-menopausal  Other Topics Concern  . Not on file  Social History Narrative  . Not on file   Social Determinants of Health   Financial Resource Strain: Low Risk   . Difficulty of Paying Living Expenses: Not hard at all  Food Insecurity: No Food Insecurity  . Worried About Charity fundraiser in the Last Year: Never true  . Ran Out of Food in the Last Year: Never true  Transportation Needs: No Transportation Needs  . Lack of Transportation (Medical): No  . Lack of Transportation (Non-Medical): No  Physical Activity: Insufficiently Active  . Days of Exercise per Week: 4 days  . Minutes of Exercise per Session: 30 min  Stress: No Stress Concern Present  . Feeling of Stress : Not at all  Social Connections: Moderately Isolated  . Frequency of Communication with Friends and Family: More  than three times a week  . Frequency of Social Gatherings with Friends and Family: More than three times a week  . Attends Religious Services: More than 4 times per year  . Active Member of Clubs or Organizations: No  . Attends Archivist Meetings: Never  . Marital Status: Divorced  Human resources officer Violence: Not At Risk  . Fear of Current or Ex-Partner: No  . Emotionally Abused: No  . Physically Abused: No  . Sexually Abused: No    FAMILY HISTORY:  Family History  Problem Relation Age of Onset  . Hypertension Mother   . Heart failure Mother   . Congestive Heart Failure Mother   . COPD Mother   . Pernicious anemia Mother   . Cancer Mother        lung  . Hypertension Father   . CAD Father   . Heart attack Father   . Hypertension Sister   . Cancer Other   . Celiac disease Other     CURRENT MEDICATIONS:  Current Outpatient Medications  Medication Sig Dispense Refill  . albuterol (PROVENTIL HFA;VENTOLIN HFA) 108 (90 Base) MCG/ACT inhaler Inhale 2 puffs into the lungs every 6 (six) hours as needed for wheezing or shortness of breath. 1 Inhaler 2  . albuterol (PROVENTIL) (2.5 MG/3ML) 0.083% nebulizer solution Take 2.5 mg by nebulization every 6 (six) hours.    Marland Kitchen aspirin EC 81 MG tablet Take 81 mg by mouth daily.    . Calcium Carb-Cholecalciferol (CALCIUM 600+D) 600-800 MG-UNIT TABS Take 1 tablet by mouth 2 (two) times daily.    . Cholecalciferol (VITAMIN D-3) 1000 units CAPS Take 1,000 Units daily by mouth.     . clonazePAM (KLONOPIN) 0.5 MG tablet TAKE ONE TABLET BY MOUTH AT BEDTIME. 90 tablet 4  . cyanocobalamin (,VITAMIN B-12,) 1000 MCG/ML injection INJECT 1 ML INTO THE MUSCLE ONCE MONTHLY AS DIRECTED. (Patient taking differently: Inject 1,000 mcg into the muscle every 30 (thirty) days. ) 1 mL 5  . dexamethasone (DECADRON) 2 MG tablet Take 1 tablet (2 mg total) by mouth daily. (Patient taking differently: Take 1 mg by mouth daily. Taking 1/2 tablet) 30 tablet 3  .  ergocalciferol (VITAMIN D2) 1.25 MG (50000 UT) capsule Take by  mouth.    . gabapentin (NEURONTIN) 600 MG tablet Take 1 tablet (600 mg total) by mouth daily. 30 tablet 6  . guaiFENesin (MUCINEX) 600 MG 12 hr tablet Take 1 tablet (600 mg total) by mouth 2 (two) times daily as needed for cough or to loosen phlegm.    . ibandronate (BONIVA) 150 MG tablet Take 150 mg by mouth every 30 (thirty) days.     Marland Kitchen ibuprofen (ADVIL,MOTRIN) 800 MG tablet Take 800 mg by mouth every 8 (eight) hours as needed for moderate pain.     Marland Kitchen ipratropium (ATROVENT) 0.02 % nebulizer solution Take by nebulization.    Marland Kitchen ipratropium-albuterol (DUONEB) 0.5-2.5 (3) MG/3ML SOLN Take 3 mLs by nebulization every 6 (six) hours as needed (shortness of breath). 360 mL 0  . lidocaine-prilocaine (EMLA) cream Apply a small amount to port a cath site and cover with plastic wrap 1 hour prior to chemotherapy appointments (Patient not taking: Reported on 03/21/2020) 30 g 3  . methocarbamol (ROBAXIN) 500 MG tablet Take 500 mg by mouth every 6 (six) hours as needed for muscle spasms.     . mirtazapine (REMERON) 15 MG tablet Take 1 tablet (15 mg total) by mouth at bedtime. 30 tablet 0  . nitroGLYCERIN (NITRODUR - DOSED IN MG/24 HR) 0.1 mg/hr patch Place 0.1 mg onto the skin daily.     . ondansetron (ZOFRAN ODT) 8 MG disintegrating tablet Take 1 tablet (8 mg total) by mouth every 8 (eight) hours as needed for nausea or vomiting. (Patient not taking: Reported on 03/21/2020) 60 tablet 2  . oxyCODONE (OXY IR/ROXICODONE) 5 MG immediate release tablet TAKE ONE TABLET BY MOUTH EVERY 8 HOURS 90 tablet 0  . OXYGEN Inhale 3 L into the lungs daily.    . pantoprazole (PROTONIX) 40 MG tablet TAKE ONE TABLET BY MOUTH TWICE DAILY. 60 tablet 0  . polyethylene glycol (MIRALAX / GLYCOLAX) 17 g packet Take 17 g by mouth daily as needed for mild constipation.    . potassium chloride SA (KLOR-CON) 20 MEQ tablet Take 1 tablet (20 mEq total) by mouth 3 (three) times daily.  (Patient taking differently: Take 20 mEq by mouth daily. ) 60 tablet 2  . rizatriptan (MAXALT) 10 MG tablet Take 10 mg by mouth 2 (two) times daily. 1-2 times daily May repeat in 2 hours if needed    . rosuvastatin (CRESTOR) 40 MG tablet Take 40 mg at bedtime by mouth.     . traMADol (ULTRAM) 50 MG tablet Take 50 mg by mouth every 6 (six) hours as needed.    . triamcinolone cream (KENALOG) 0.1 %      No current facility-administered medications for this visit.   Facility-Administered Medications Ordered in Other Visits  Medication Dose Route Frequency Provider Last Rate Last Admin  . sodium chloride flush (NS) 0.9 % injection 10 mL  10 mL Intravenous PRN Lockamy, Randi L, NP-C   10 mL at 12/22/19 1240    ALLERGIES:  Allergies  Allergen Reactions  . Penicillins Hives and Rash    Did it involve swelling of the face/tongue/throat, SOB, or low BP? No Did it involve sudden or severe rash/hives, skin peeling, or any reaction on the inside of your mouth or nose? No Did you need to seek medical attention at a hospital or doctor's office? No When did it last happen?5-10 year If all above answers are "NO", may proceed with cephalosporin use.    . Tape Rash    Medipore, Coban,  and paper tape CAN be tolerated, Patient states that this can be removed     PHYSICAL EXAM:  Performance status (ECOG): 1 - Symptomatic but completely ambulatory  There were no vitals filed for this visit. Wt Readings from Last 3 Encounters:  03/21/20 92 lb 13 oz (42.1 kg)  02/17/20 90 lb 13.3 oz (41.2 kg)  01/26/20 95 lb 6.4 oz (43.3 kg)   Physical Exam Vitals reviewed.  Constitutional:      Appearance: Normal appearance.  Cardiovascular:     Rate and Rhythm: Normal rate and regular rhythm.     Heart sounds: Normal heart sounds.  Pulmonary:     Effort: Pulmonary effort is normal.     Breath sounds: Normal breath sounds.  Chest:     Comments: Port-a-Cath in L chest Abdominal:     Palpations: Abdomen  is soft.  Musculoskeletal:        General: No swelling.  Skin:    General: Skin is warm.  Neurological:     General: No focal deficit present.     Mental Status: She is alert and oriented to person, place, and time.  Psychiatric:        Mood and Affect: Mood normal.        Behavior: Behavior normal.     LABORATORY DATA:  I have reviewed the labs as listed.  CBC Latest Ref Rng & Units 03/21/2020 02/17/2020 01/26/2020  WBC 4.0 - 10.5 K/uL 9.0 9.9 10.1  Hemoglobin 12.0 - 15.0 g/dL 11.5(L) 11.5(L) 11.1(L)  Hematocrit 36.0 - 46.0 % 36.7 36.7 35.5(L)  Platelets 150 - 400 K/uL 484(H) 607(H) 605(H)   CMP Latest Ref Rng & Units 03/21/2020 02/17/2020 01/26/2020  Glucose 70 - 99 mg/dL 116(H) 85 130(H)  BUN 8 - 23 mg/dL _0 Creatinine 0.44 - 1.00 mg/dL 0.89 0.85 1.07(H)  Sodium 135 - 145 mmol/L 136 137 138  Potassium 3.5 - 5.1 mmol/L 3.5 3.1(L) 3.4(L)  Chloride 98 - 111 mmol/L 104 103 102  CO2 22 - 32 mmol/L _1 Calcium 8.9 - 10.3 mg/dL 8.8(L) 9.0 8.5(L)  Total Protein 6.5 - 8.1 g/dL 6.4(L) 6.7 6.4(L)  Total Bilirubin 0.3 - 1.2 mg/dL 0.5 0.6 0.5  Alkaline Phos 38 - 126 U/L 58 62 65  AST 15 - 41 U/L _2 ALT 0 - 44 U/L _3 DIAGNOSTIC IMAGING:  I have independently reviewed the scans and discussed with the patient. No results found.   ASSESSMENT:  1. 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 -30 Gy of radiation in 10 fractions from 01/28/2019 through 02/10/2019. -4 cycles of carboplatin and etoposide from 03/09/2019 through 05/11/2019. -PCI completed on 07/31/2019. -We reviewed MRI of the brain on 09/15/2019 with no evidence of brain meta stasis. Concern for hypointense appearance at the C3 vertebral body and left  articular process. -CT chest on 09/15/2019 shows numerous new small solid irregular pulmonary nodules scattered throughout both lungs, largest 7 mm on the right lower lobe. Tiny loculated anterior right pleural effusion decreased. Stable mild subcarinal adenopathy. Bilateral stable adrenal adenomas. -MRI of the C-spine on 10/13/2019 showed marrow edema and enhancement involving C3 vertebral body and left articular process without discrete bone lesion. This was thought to be  degenerative. -PET scan on 09/28/2019 shows bilateral lung nodules, below PET resolution. Small right-sided nodular densities demonstrating low-level hypermetabolism. Mild hypermetabolic corresponding to a normal-sized subcarinal lymph node. Multifocal right pleural hypermetabolism in the setting of pleural thickening and prior right upper lobectomy, indeterminate. No hypermetabolic extrathoracic disease. -PET scan on 11/24/2019 shows progressive increased soft tissue within the right lung along the suture margins, positive right paratracheal lymph node, small subcapsular focus of increased radiotracer uptake overlying the anterolateral right hepatic lobe concerning for tumor.   PLAN:  1. Stage III (T3N1) small cell lung cancer: -Reviewed her labs.  CBC is adequate. -Complaint of leg pains which started 4 to 5 weeks ago. -Reviewed side effects of the chemotherapy.  It is likely that the leg pains are caused by her treatments. -I will cut back on the dose of Lurbi to 67m/m2 and see if it helps. -RTC 3 weeks for follow-up.  Plan to repeat scan after the next treatment.  2. Essential thrombocytosis: -Hydrea on hold since chemotherapy started for lung cancer.  Platelet count today is 589.  3. Macrocytic anemia: -Hemoglobin 11.  Normal MCV.  4. Weight loss: -Continue dexamethasone 1 mg in the mornings.  Continue Remeron 15 mg daily.  5. Mid back and sternal pain: -Continue oxycodone 5 mg 1 tablet in the morning, 1  tablet in the noon, 2 tablets at bedtime.  She is also taking tramadol as needed for milder pains.  6. Hypokalemia: -Continue potassium 20 mEq daily.   Orders placed this encounter:  No orders of the defined types were placed in this encounter.    SDerek Jack MD [phone]  I, DMilinda Antis am acting as a scribe for Dr. SSanda Linger  I, SDerek JackMD, have reviewed the above documentation for accuracy and completeness, and I agree with the above.

## 2020-04-12 ENCOUNTER — Other Ambulatory Visit (HOSPITAL_COMMUNITY): Payer: Self-pay

## 2020-04-12 ENCOUNTER — Inpatient Hospital Stay (HOSPITAL_BASED_OUTPATIENT_CLINIC_OR_DEPARTMENT_OTHER): Payer: Medicare Other | Admitting: Hematology

## 2020-04-12 ENCOUNTER — Inpatient Hospital Stay (HOSPITAL_COMMUNITY): Payer: Medicare Other

## 2020-04-12 ENCOUNTER — Other Ambulatory Visit: Payer: Self-pay

## 2020-04-12 VITALS — BP 107/62 | HR 83 | Temp 97.2°F | Resp 16 | Wt 91.2 lb

## 2020-04-12 VITALS — BP 110/57 | HR 74 | Temp 97.0°F | Resp 16

## 2020-04-12 DIAGNOSIS — C3491 Malignant neoplasm of unspecified part of right bronchus or lung: Secondary | ICD-10-CM

## 2020-04-12 DIAGNOSIS — Z5111 Encounter for antineoplastic chemotherapy: Secondary | ICD-10-CM | POA: Diagnosis not present

## 2020-04-12 LAB — COMPREHENSIVE METABOLIC PANEL
ALT: 12 U/L (ref 0–44)
AST: 22 U/L (ref 15–41)
Albumin: 3.3 g/dL — ABNORMAL LOW (ref 3.5–5.0)
Alkaline Phosphatase: 61 U/L (ref 38–126)
Anion gap: 8 (ref 5–15)
BUN: 14 mg/dL (ref 8–23)
CO2: 27 mmol/L (ref 22–32)
Calcium: 8.6 mg/dL — ABNORMAL LOW (ref 8.9–10.3)
Chloride: 102 mmol/L (ref 98–111)
Creatinine, Ser: 1.01 mg/dL — ABNORMAL HIGH (ref 0.44–1.00)
GFR, Estimated: 60 mL/min — ABNORMAL LOW (ref >60)
Glucose, Bld: 96 mg/dL (ref 70–99)
Potassium: 3.4 mmol/L — ABNORMAL LOW (ref 3.5–5.1)
Sodium: 137 mmol/L (ref 135–145)
Total Bilirubin: 0.6 mg/dL (ref 0.3–1.2)
Total Protein: 6.3 g/dL — ABNORMAL LOW (ref 6.5–8.1)

## 2020-04-12 LAB — CBC WITH DIFFERENTIAL/PLATELET
Abs Immature Granulocytes: 0.01 10*3/uL (ref 0.00–0.07)
Basophils Absolute: 0.1 10*3/uL (ref 0.0–0.1)
Basophils Relative: 1 %
Eosinophils Absolute: 0.2 10*3/uL (ref 0.0–0.5)
Eosinophils Relative: 4 %
HCT: 35.8 % — ABNORMAL LOW (ref 36.0–46.0)
Hemoglobin: 11 g/dL — ABNORMAL LOW (ref 12.0–15.0)
Immature Granulocytes: 0 %
Lymphocytes Relative: 22 %
Lymphs Abs: 1.5 10*3/uL (ref 0.7–4.0)
MCH: 28 pg (ref 26.0–34.0)
MCHC: 30.7 g/dL (ref 30.0–36.0)
MCV: 91.1 fL (ref 80.0–100.0)
Monocytes Absolute: 0.8 10*3/uL (ref 0.1–1.0)
Monocytes Relative: 11 %
Neutro Abs: 4.2 10*3/uL (ref 1.7–7.7)
Neutrophils Relative %: 62 %
Platelets: 589 10*3/uL — ABNORMAL HIGH (ref 150–400)
RBC: 3.93 MIL/uL (ref 3.87–5.11)
RDW: 15.3 % (ref 11.5–15.5)
WBC: 6.8 10*3/uL (ref 4.0–10.5)
nRBC: 0 % (ref 0.0–0.2)

## 2020-04-12 LAB — MAGNESIUM: Magnesium: 1.8 mg/dL (ref 1.7–2.4)

## 2020-04-12 MED ORDER — PALONOSETRON HCL INJECTION 0.25 MG/5ML
0.2500 mg | Freq: Once | INTRAVENOUS | Status: AC
  Administered 2020-04-12: 14:00:00 0.25 mg via INTRAVENOUS

## 2020-04-12 MED ORDER — SODIUM CHLORIDE 0.9 % IV SOLN
10.0000 mg | Freq: Once | INTRAVENOUS | Status: AC
  Administered 2020-04-12: 14:00:00 10 mg via INTRAVENOUS
  Filled 2020-04-12: qty 10

## 2020-04-12 MED ORDER — HEPARIN SOD (PORK) LOCK FLUSH 100 UNIT/ML IV SOLN
500.0000 [IU] | Freq: Once | INTRAVENOUS | Status: AC | PRN
  Administered 2020-04-12: 16:00:00 500 [IU]

## 2020-04-12 MED ORDER — SODIUM CHLORIDE 0.9% FLUSH
10.0000 mL | INTRAVENOUS | Status: DC | PRN
  Administered 2020-04-12: 16:00:00 10 mL

## 2020-04-12 MED ORDER — SODIUM CHLORIDE 0.9 % IV SOLN: Freq: Once | INTRAVENOUS | Status: AC

## 2020-04-12 MED ORDER — MIRTAZAPINE 15 MG PO TABS: 15.0000 mg | ORAL_TABLET | Freq: Every day | ORAL | 0 refills | Status: DC

## 2020-04-12 MED ORDER — DEXAMETHASONE 2 MG PO TABS: 2.0000 mg | ORAL_TABLET | Freq: Every day | ORAL | 3 refills | Status: DC

## 2020-04-12 MED ORDER — PALONOSETRON HCL INJECTION 0.25 MG/5ML
INTRAVENOUS | Status: AC
  Filled 2020-04-12: qty 5

## 2020-04-12 MED ORDER — OXYCODONE HCL 5 MG PO TABS: 5.0000 mg | ORAL_TABLET | Freq: Three times a day (TID) | ORAL | 0 refills | Status: DC

## 2020-04-12 MED ORDER — SODIUM CHLORIDE 0.9 % IV SOLN
2.0000 mg/m2 | Freq: Once | INTRAVENOUS | Status: AC
  Administered 2020-04-12: 15:00:00 2.8 mg via INTRAVENOUS
  Filled 2020-04-12: qty 5.6

## 2020-04-12 NOTE — Patient Instructions (Signed)
[facility] at [facility] Discharge Instructions  You were seen today by Dr. Katragadda. Follow up as scheduled.   Thank you for choosing [facility] at [facility] to provide your oncology and hematology care.  To afford each patient quality time with our provider, please arrive at least 15 minutes before your scheduled appointment time.   If you have a lab appointment with the Cancer Center please come in thru the Main Entrance and check in at the main information desk.  You need to re-schedule your appointment should you arrive 10 or more minutes late.  We strive to give you quality time with our providers, and arriving late affects you and other patients whose appointments are after yours.  Also, if you no show three or more times for appointments you may be dismissed from the clinic at the providers discretion.     Again, thank you for choosing [facility].  Our hope is that these requests will decrease the amount of time that you wait before being seen by our physicians.       _____________________________________________________________  Should you have questions after your visit to [facility], please contact our office at [phone] and follow the prompts.  Our office hours are 8:00 a.m. and 4:30 p.m. Monday - Friday.  Please note that voicemails left after 4:00 p.m. may not be returned until the following business day.  We are closed weekends and major holidays.  You do have access to a nurse 24-7, just call the main number to the clinic [phone] and do not press any options, hold on the line and a nurse will answer the phone.    For prescription refill requests, have your pharmacy contact our office and allow 72 hours.    Due to Covid, you will need to wear a mask upon entering the hospital. If you do not have a mask, a mask will be given to you at the Main Entrance upon arrival. For doctor visits, patients  may have 1 support person age 18 or older with them. For treatment visits, patients can not have anyone with them due to social distancing guidelines and our immunocompromised population.      

## 2020-04-12 NOTE — Progress Notes (Signed)
Patients port flushed without difficulty.  Good blood return noted with no bruising or swelling noted at site.  Transparent dressing applied.  Patient left accessed for chemotherapy treatment. 

## 2020-04-12 NOTE — Patient Instructions (Signed)
[facility] Discharge Instructions for Patients Receiving Chemotherapy  Today you received the following chemotherapy agents   To help prevent nausea and vomiting after your treatment, we encourage you to take your nausea medication   If you develop nausea and vomiting that is not controlled by your nausea medication, call the clinic.   BELOW ARE SYMPTOMS THAT SHOULD BE REPORTED IMMEDIATELY:  *FEVER GREATER THAN 100.5 F  *CHILLS WITH OR WITHOUT FEVER  NAUSEA AND VOMITING THAT IS NOT CONTROLLED WITH YOUR NAUSEA MEDICATION  *UNUSUAL SHORTNESS OF BREATH  *UNUSUAL BRUISING OR BLEEDING  TENDERNESS IN MOUTH AND THROAT WITH OR WITHOUT PRESENCE OF ULCERS  *URINARY PROBLEMS  *BOWEL PROBLEMS  UNUSUAL RASH Items with * indicate a potential emergency and should be followed up as soon as possible.  Feel free to call the clinic should you have any questions or concerns. The clinic phone number is [phone].  Please show the CHEMO ALERT CARD at check-in to the Emergency Department and triage nurse.   

## 2020-04-12 NOTE — Progress Notes (Signed)
Pt here for D1C6 of zepzelca.  Dr Raliegh Ip decreasing the dosage on her Zepzelca.  Labs WNL for treatment.  Okay for treatment today.   Tolerated treatment well today without incidence.  Vital signs stable prior to discharge.  Discharged via wheelchair in stable condition.

## 2020-04-22 ENCOUNTER — Ambulatory Visit (HOSPITAL_COMMUNITY): Payer: Medicare Other

## 2020-04-22 NOTE — Progress Notes (Signed)
Nutrition Follow-up:   Patient with lung cancer.  Patient is receiving chemotherapy.   Spoke with patient via phone.  Patient reports that Sunday through Wednesday had bone pain in legs and arms.  Michela Pitcher that she was in bed most of Monday and Tuesday and did not eat much.  Reports that appetite has returned yesterday and today and pain has stopped.  Yesterday was able to eat 3 meals.  Patient is taking dexamethasone and remeron.      Medications: reviewed  Labs: reveiwed  Anthropometrics:   Weight 91 lb 3.2 oz on 12/28 stable from 90 lb on 11/3   NUTRITION DIAGNOSIS: Inadequate oral  intake continues    INTERVENTION:  Encouraged high calorie, high protein foods especially on good days.  Continue appetite stimulants    MONITORING, EVALUATION, GOAL: weight trends, intake   NEXT VISIT: Feb 4 phone call  Stylianos Stradling B. Zenia Resides, [place], [place] Registered Dietitian [phone] (mobile)

## 2020-05-05 ENCOUNTER — Encounter (HOSPITAL_COMMUNITY): Payer: Self-pay | Admitting: Hematology

## 2020-05-05 ENCOUNTER — Inpatient Hospital Stay (HOSPITAL_COMMUNITY): Payer: Medicare Other

## 2020-05-05 ENCOUNTER — Inpatient Hospital Stay (HOSPITAL_BASED_OUTPATIENT_CLINIC_OR_DEPARTMENT_OTHER): Payer: Medicare Other | Admitting: Hematology

## 2020-05-05 ENCOUNTER — Other Ambulatory Visit (HOSPITAL_COMMUNITY): Payer: Self-pay | Admitting: *Deleted

## 2020-05-05 ENCOUNTER — Other Ambulatory Visit: Payer: Self-pay

## 2020-05-05 ENCOUNTER — Inpatient Hospital Stay (HOSPITAL_COMMUNITY): Payer: Medicare Other | Attending: Hematology

## 2020-05-05 VITALS — BP 113/81 | HR 81 | Temp 97.3°F | Resp 18 | Wt 95.1 lb

## 2020-05-05 VITALS — BP 123/84 | HR 76 | Temp 97.3°F | Resp 18

## 2020-05-05 DIAGNOSIS — M549 Dorsalgia, unspecified: Secondary | ICD-10-CM | POA: Diagnosis not present

## 2020-05-05 DIAGNOSIS — R634 Abnormal weight loss: Secondary | ICD-10-CM | POA: Diagnosis not present

## 2020-05-05 DIAGNOSIS — Z79899 Other long term (current) drug therapy: Secondary | ICD-10-CM | POA: Diagnosis not present

## 2020-05-05 DIAGNOSIS — Z902 Acquired absence of lung [part of]: Secondary | ICD-10-CM | POA: Insufficient documentation

## 2020-05-05 DIAGNOSIS — Z5111 Encounter for antineoplastic chemotherapy: Secondary | ICD-10-CM | POA: Insufficient documentation

## 2020-05-05 DIAGNOSIS — L237 Allergic contact dermatitis due to plants, except food: Secondary | ICD-10-CM | POA: Insufficient documentation

## 2020-05-05 DIAGNOSIS — D539 Nutritional anemia, unspecified: Secondary | ICD-10-CM | POA: Insufficient documentation

## 2020-05-05 DIAGNOSIS — D473 Essential (hemorrhagic) thrombocythemia: Secondary | ICD-10-CM | POA: Diagnosis not present

## 2020-05-05 DIAGNOSIS — R1010 Upper abdominal pain, unspecified: Secondary | ICD-10-CM | POA: Insufficient documentation

## 2020-05-05 DIAGNOSIS — N179 Acute kidney failure, unspecified: Secondary | ICD-10-CM | POA: Insufficient documentation

## 2020-05-05 DIAGNOSIS — R109 Unspecified abdominal pain: Secondary | ICD-10-CM | POA: Insufficient documentation

## 2020-05-05 DIAGNOSIS — K769 Liver disease, unspecified: Secondary | ICD-10-CM | POA: Insufficient documentation

## 2020-05-05 DIAGNOSIS — C3491 Malignant neoplasm of unspecified part of right bronchus or lung: Secondary | ICD-10-CM

## 2020-05-05 DIAGNOSIS — E876 Hypokalemia: Secondary | ICD-10-CM | POA: Insufficient documentation

## 2020-05-05 DIAGNOSIS — K861 Other chronic pancreatitis: Secondary | ICD-10-CM | POA: Insufficient documentation

## 2020-05-05 DIAGNOSIS — E559 Vitamin D deficiency, unspecified: Secondary | ICD-10-CM | POA: Insufficient documentation

## 2020-05-05 DIAGNOSIS — D75839 Thrombocytosis, unspecified: Secondary | ICD-10-CM | POA: Insufficient documentation

## 2020-05-05 DIAGNOSIS — Z90710 Acquired absence of both cervix and uterus: Secondary | ICD-10-CM | POA: Insufficient documentation

## 2020-05-05 DIAGNOSIS — I714 Abdominal aortic aneurysm, without rupture, unspecified: Secondary | ICD-10-CM | POA: Insufficient documentation

## 2020-05-05 DIAGNOSIS — Z923 Personal history of irradiation: Secondary | ICD-10-CM | POA: Insufficient documentation

## 2020-05-05 DIAGNOSIS — G8929 Other chronic pain: Secondary | ICD-10-CM | POA: Insufficient documentation

## 2020-05-05 DIAGNOSIS — R609 Edema, unspecified: Secondary | ICD-10-CM | POA: Insufficient documentation

## 2020-05-05 DIAGNOSIS — M8580 Other specified disorders of bone density and structure, unspecified site: Secondary | ICD-10-CM | POA: Insufficient documentation

## 2020-05-05 DIAGNOSIS — C50919 Malignant neoplasm of unspecified site of unspecified female breast: Secondary | ICD-10-CM | POA: Insufficient documentation

## 2020-05-05 DIAGNOSIS — C3411 Malignant neoplasm of upper lobe, right bronchus or lung: Secondary | ICD-10-CM | POA: Insufficient documentation

## 2020-05-05 DIAGNOSIS — K639 Disease of intestine, unspecified: Secondary | ICD-10-CM | POA: Insufficient documentation

## 2020-05-05 DIAGNOSIS — Z681 Body mass index (BMI) 19 or less, adult: Secondary | ICD-10-CM | POA: Insufficient documentation

## 2020-05-05 DIAGNOSIS — R5383 Other fatigue: Secondary | ICD-10-CM | POA: Insufficient documentation

## 2020-05-05 DIAGNOSIS — R739 Hyperglycemia, unspecified: Secondary | ICD-10-CM | POA: Insufficient documentation

## 2020-05-05 DIAGNOSIS — E782 Mixed hyperlipidemia: Secondary | ICD-10-CM | POA: Insufficient documentation

## 2020-05-05 DIAGNOSIS — I10 Essential (primary) hypertension: Secondary | ICD-10-CM | POA: Insufficient documentation

## 2020-05-05 DIAGNOSIS — K219 Gastro-esophageal reflux disease without esophagitis: Secondary | ICD-10-CM | POA: Insufficient documentation

## 2020-05-05 DIAGNOSIS — Z9012 Acquired absence of left breast and nipple: Secondary | ICD-10-CM | POA: Insufficient documentation

## 2020-05-05 LAB — COMPREHENSIVE METABOLIC PANEL
ALT: 13 U/L (ref 0–44)
AST: 23 U/L (ref 15–41)
Albumin: 3.4 g/dL — ABNORMAL LOW (ref 3.5–5.0)
Alkaline Phosphatase: 58 U/L (ref 38–126)
Anion gap: 8 (ref 5–15)
BUN: 19 mg/dL (ref 8–23)
CO2: 28 mmol/L (ref 22–32)
Calcium: 9.3 mg/dL (ref 8.9–10.3)
Chloride: 100 mmol/L (ref 98–111)
Creatinine, Ser: 1.09 mg/dL — ABNORMAL HIGH (ref 0.44–1.00)
GFR, Estimated: 54 mL/min — ABNORMAL LOW (ref >60)
Glucose, Bld: 141 mg/dL — ABNORMAL HIGH (ref 70–99)
Potassium: 3.2 mmol/L — ABNORMAL LOW (ref 3.5–5.1)
Sodium: 136 mmol/L (ref 135–145)
Total Bilirubin: <0.1 mg/dL — ABNORMAL LOW (ref 0.3–1.2)
Total Protein: 6.8 g/dL (ref 6.5–8.1)

## 2020-05-05 LAB — CBC WITH DIFFERENTIAL/PLATELET
Abs Immature Granulocytes: 0.06 10*3/uL (ref 0.00–0.07)
Basophils Absolute: 0.1 10*3/uL (ref 0.0–0.1)
Basophils Relative: 1 %
Eosinophils Absolute: 0.2 10*3/uL (ref 0.0–0.5)
Eosinophils Relative: 2 %
HCT: 36.4 % (ref 36.0–46.0)
Hemoglobin: 11.5 g/dL — ABNORMAL LOW (ref 12.0–15.0)
Immature Granulocytes: 1 %
Lymphocytes Relative: 10 %
Lymphs Abs: 1 10*3/uL (ref 0.7–4.0)
MCH: 28.8 pg (ref 26.0–34.0)
MCHC: 31.6 g/dL (ref 30.0–36.0)
MCV: 91.2 fL (ref 80.0–100.0)
Monocytes Absolute: 0.7 10*3/uL (ref 0.1–1.0)
Monocytes Relative: 6 %
Neutro Abs: 8.8 10*3/uL — ABNORMAL HIGH (ref 1.7–7.7)
Neutrophils Relative %: 80 %
Platelets: 596 10*3/uL — ABNORMAL HIGH (ref 150–400)
RBC: 3.99 MIL/uL (ref 3.87–5.11)
RDW: 15.9 % — ABNORMAL HIGH (ref 11.5–15.5)
WBC: 10.8 10*3/uL — ABNORMAL HIGH (ref 4.0–10.5)
nRBC: 0 % (ref 0.0–0.2)

## 2020-05-05 LAB — MAGNESIUM: Magnesium: 1.9 mg/dL (ref 1.7–2.4)

## 2020-05-05 MED ORDER — POTASSIUM CHLORIDE CRYS ER 20 MEQ PO TBCR
20.0000 meq | EXTENDED_RELEASE_TABLET | Freq: Once | ORAL | Status: AC
  Administered 2020-05-05: 20 meq via ORAL

## 2020-05-05 MED ORDER — IBUPROFEN 800 MG PO TABS: 800.0000 mg | ORAL_TABLET | Freq: Three times a day (TID) | ORAL | 6 refills | Status: AC | PRN

## 2020-05-05 MED ORDER — PALONOSETRON HCL INJECTION 0.25 MG/5ML
0.2500 mg | Freq: Once | INTRAVENOUS | Status: AC
  Administered 2020-05-05: 0.25 mg via INTRAVENOUS
  Filled 2020-05-05: qty 5

## 2020-05-05 MED ORDER — SODIUM CHLORIDE 0.9% FLUSH
10.0000 mL | INTRAVENOUS | Status: DC | PRN
  Administered 2020-05-05: 10 mL

## 2020-05-05 MED ORDER — SODIUM CHLORIDE 0.9 % IV SOLN
10.0000 mg | Freq: Once | INTRAVENOUS | Status: AC
  Administered 2020-05-05: 10 mg via INTRAVENOUS
  Filled 2020-05-05: qty 10

## 2020-05-05 MED ORDER — HEPARIN SOD (PORK) LOCK FLUSH 100 UNIT/ML IV SOLN
500.0000 [IU] | Freq: Once | INTRAVENOUS | Status: AC | PRN
  Administered 2020-05-05: 500 [IU]

## 2020-05-05 MED ORDER — SODIUM CHLORIDE 0.9 % IV SOLN: Freq: Once | INTRAVENOUS | Status: AC

## 2020-05-05 MED ORDER — SODIUM CHLORIDE 0.9 % IV SOLN
2.0000 mg/m2 | Freq: Once | INTRAVENOUS | Status: AC
  Administered 2020-05-05: 2.8 mg via INTRAVENOUS
  Filled 2020-05-05: qty 5.6

## 2020-05-05 MED ORDER — POTASSIUM CHLORIDE CRYS ER 20 MEQ PO TBCR
EXTENDED_RELEASE_TABLET | ORAL | Status: AC
  Filled 2020-05-05: qty 1

## 2020-05-05 MED ORDER — OXYCODONE HCL 5 MG PO TABS: 5.0000 mg | ORAL_TABLET | Freq: Three times a day (TID) | ORAL | 0 refills | Status: DC

## 2020-05-05 NOTE — Progress Notes (Signed)
Pt here for zepzelca.  Potassium 3.2.  Okay for treatment today. Potassium 20 meq PO.   Tolerated treatment well without incidence.  Discharged in stable condition via wheelchair. Vital signs stable prior to discharge.

## 2020-05-05 NOTE — Patient Instructions (Signed)
[facility] Discharge Instructions for Patients Receiving Chemotherapy  Today you received the following chemotherapy agents   To help prevent nausea and vomiting after your treatment, we encourage you to take your nausea medication   If you develop nausea and vomiting that is not controlled by your nausea medication, call the clinic.   BELOW ARE SYMPTOMS THAT SHOULD BE REPORTED IMMEDIATELY:  *FEVER GREATER THAN 100.5 F  *CHILLS WITH OR WITHOUT FEVER  NAUSEA AND VOMITING THAT IS NOT CONTROLLED WITH YOUR NAUSEA MEDICATION  *UNUSUAL SHORTNESS OF BREATH  *UNUSUAL BRUISING OR BLEEDING  TENDERNESS IN MOUTH AND THROAT WITH OR WITHOUT PRESENCE OF ULCERS  *URINARY PROBLEMS  *BOWEL PROBLEMS  UNUSUAL RASH Items with * indicate a potential emergency and should be followed up as soon as possible.  Feel free to call the clinic should you have any questions or concerns. The clinic phone number is [phone].  Please show the CHEMO ALERT CARD at check-in to the Emergency Department and triage nurse.   

## 2020-05-05 NOTE — Progress Notes (Signed)
[address], [place] [address]   CLINIC:  Medical Oncology/Hematology  PCP:  [name], NP [address] / [place] [address] [phone]   REASON FOR VISIT:  Follow-up for right small cell lung cancer  PRIOR THERAPY:  1. Right upper lobectomy on 12/30/2018. 2. 30 Gy of radiation in 10 fractions from 01/28/2019 through 02/10/2019. 3. Carboplatin and etoposide x 4 cycles from 03/09/2019 through 05/11/2019.  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 The 3 liver lesions are not hypermetabolic. The radiologist suspects they may be subtly present on prior CT chest from 10/2013, to further reassuring that these are likely benign lesions.   Melanoma ([place])  07/09/2013 Initial Biopsy   Initial biopsy L upper thigh/buttocks melanoma   07/20/2013 Surgery   Excision L lateral buttock/upper thigh melanoma, clear margins   07/20/2013 Pathology Results   Breslow depth 1.02 mm, pT2a    Small cell lung carcinoma, right ([place])  11/27/2018 Initial Diagnosis   Small cell lung carcinoma, right ([place])   03/09/2019 - 05/15/2019 Chemotherapy   The patient had palonosetron (ALOXI) injection 0.25 mg, 0.25 mg, Intravenous,  Once, 4 of 4 cycles Administration: 0.25 mg (03/09/2019), 0.25 mg (03/30/2019), 0.25 mg (04/20/2019), 0.25 mg (05/11/2019) pegfilgrastim (NEULASTA ONPRO KIT) injection 6 mg, 6  mg, Subcutaneous, Once, 1 of 1 cycle Administration: 6 mg (03/11/2019) pegfilgrastim-jmdb (FULPHILA) injection 6 mg, 6 mg, Subcutaneous,  Once, 3 of 3 cycles Administration: 6 mg (04/03/2019), 6 mg (04/24/2019), 6 mg (05/15/2019) CARBOplatin (PARAPLATIN) 330 mg in sodium chloride 0.9 % 250 mL chemo infusion, 330 mg (100 % of original dose 325.5 mg), Intravenous,  Once, 4 of 4 cycles Dose modification:   (original dose 325.5 mg, Cycle 1),   (original dose 325.5 mg, Cycle 2),   (original dose 309 mg, Cycle 3),   (original dose 325.5 mg, Cycle 4) Administration: 330 mg (03/09/2019), 330 mg (03/30/2019), 310 mg (04/20/2019), 330 mg (05/11/2019) etoposide (VEPESID) 150 mg in sodium chloride 0.9 % 500 mL chemo infusion, 100 mg/m2 = 150 mg, Intravenous,  Once, 4 of 4 cycles Administration: 150 mg (03/09/2019), 150 mg (03/10/2019), 150 mg (03/11/2019), 150 mg (03/30/2019), 150 mg (03/31/2019), 150 mg (04/01/2019), 150 mg (04/20/2019), 150 mg (04/21/2019), 150 mg (04/22/2019), 150 mg (05/11/2019), 150 mg (05/12/2019), 150 mg (05/13/2019) fosaprepitant (EMEND) 150 mg, dexamethasone (DECADRON) 12 mg in sodium chloride 0.9 % 145 mL IVPB, , Intravenous,  Once, 4 of 4 cycles Administration:  (03/09/2019),  (03/30/2019),  (04/20/2019),  (05/11/2019)  for chemotherapy treatment.    03/09/2019 Cancer Staging   Staging form: Lung, AJCC 8th Edition - Clinical: Stage IIIA (cT3, cN1, cM0) - Signed by Derek Jack, MD on 03/09/2019   12/15/2019 -  Chemotherapy    Patient is on Treatment Plan: LUNG SMALL CELL LURBINECTEDIN Q21D        CANCER STAGING:  Cancer Staging Small cell lung carcinoma, right ([place]) Staging form: Lung, AJCC 8th Edition - Clinical: Stage IIIA (cT3, cN1, cM0) - Signed by Derek Jack, MD on 03/09/2019   INTERVAL HISTORY:  Ashley [name], a 70 y.o. female, returns for routine follow-up and consideration for next cycle of chemotherapy. Ashley [name] was last seen on 04/12/2020.  Due for cycle #7 of  lurbinectedin today.   Today she is accompanied by her daughter. Overall, she tells me she has been feeling pretty well.   Overall, she feels ready for next cycle of chemo today.    REVIEW OF SYSTEMS:  Review of Systems  Constitutional: Positive for appetite change (25%) and fatigue (50%).  Gastrointestinal: Positive for constipation and nausea.  Musculoskeletal: Positive for arthralgias and back pain (5/10).  All other systems reviewed and are negative.   PAST MEDICAL/SURGICAL HISTORY:  Past Medical History:  Diagnosis Date  . 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 Anginal pain ([place])   . Arthritis   . Ascending aortic aneurysm ([place])   . Cancer [facility]) 1995   breast, left, mastectomy/chemo  . Chest pain    Possibly cardiac. No evidence of ischemia/injury based upon normal troponin I. Chest discomfort could be tachycardia induced supply demand mismatch.   . CHF (congestive heart failure) ([place]) 11/17/2015   after surgery   . Colon adenomas   . Coronary artery disease   . DJD (degenerative joint disease)   . Dyspnea    with exertion  . Emphysema of lung ([place]) 11/17/2015  . Essential hypertension, benign   . GERD (gastroesophageal reflux disease)   . History of hiatal hernia   . Hyperlipidemia   . Hypertension   . Melanoma ([place]) 01/09/2016  . Osteopenia   . Palpitations   . Pernicious anemia 03/06/2016  . Pernicious anemia   . Pure hypercholesterolemia   . Raynaud's disease   . Thrombocythemia, essential ([place]) 01/09/2016  . Thrombocytosis    Idiopathic  . Vitamin D deficiency    Past Surgical History:  Procedure Laterality Date  . ABDOMINAL HYSTERECTOMY    . ANTERIOR AND POSTERIOR REPAIR N/A 12/09/2014   Procedure: ANTERIOR (CYSTOCELE) AND POSTERIOR REPAIR (RECTOCELE);  Surgeon: Bjorn Loser, MD;  Location: [facility];  Service: Urology;  Laterality: N/A;  . ANTERIOR CERVICAL DECOMPRESSION/DISCECTOMY FUSION 4 LEVELS Right 10/03/2016   Procedure: ANTERIOR  CERVICAL DECOMPRESSION FUSION, CERVICAL 4-5, CERVICAL 5-6, CERVICAL 6-7, CERVICAL 7 TO THORACIC 1 WITH INSTRUMENTATION AND ALLOGRAFT;  Surgeon: Phylliss Bob, MD;  Location: [facility];  Service: Orthopedics;  Laterality: Right;  ANTERIOR CERVICAL DECOMPRESSION FUSION, CERVICAL 4-5, CERVICAL 5-6, CERVICAL 6-7, CERVICAL 7 TO THORACIC 1 WITH INSTRUMENTATION AND ALLOGRAFT; REQUEST 4 HO  . ANTERIOR LAT LUMBAR FUSION Left 11/16/2015   Procedure: LEFT SIDED LATERAL INTERBODY FUSION, LUMBAR 2-3, LUMBAR 3-4, LUMBAR 4-5 WITH INSTRUMENTATION;  Surgeon: Phylliss Bob, MD;  Location: [facility];  Service: Orthopedics;  Laterality: Left;  LEFT SIDED LATEARL INTERBODY FUSION, LUMBAR 2-3, LUMBAR 3-4, LUMBAR 4-5 WITH INSTRUMENTATION   . APPENDECTOMY    . BACK SURGERY    . BONE MARROW ASPIRATION  07/2012  . BONE MARROW BIOPSY  07/2012  . BREAST SURGERY    . CARDIAC CATHETERIZATION    . CARDIAC CATHETERIZATION N/A 01/20/2016   Procedure: Left Heart Cath and Coronary Angiography;  Surgeon: Burnell Blanks, MD;  Location: [facility];  Service: Cardiovascular;  Laterality: N/A;  . COLONOSCOPY  11/29/2010   Procedure: COLONOSCOPY;  Surgeon: Rogene Houston,  MD;  [facility];  Service: Endoscopy;  Laterality: N/A;  . COLONOSCOPY N/A 02/18/2014   Procedure: COLONOSCOPY;  Surgeon: Rogene Houston, MD;  [facility];  Service: Endoscopy;  Laterality: N/A;  1030  . COLONOSCOPY N/A 02/25/2017   Procedure: COLONOSCOPY;  Surgeon: Rogene Houston, MD;  [facility];  Service: Endoscopy;  Laterality: N/A;  10:55  . CYSTOSCOPY N/A 12/09/2014   Procedure: CYSTOSCOPY;  Surgeon: Bjorn Loser, MD;  Location: [facility];  Service: Urology;  Laterality: N/A;  . ESOPHAGEAL DILATION N/A 02/25/2017   Procedure: ESOPHAGEAL DILATION;  Surgeon: Rogene Houston, MD;  [facility];  Service: Endoscopy;  Laterality: N/A;  . ESOPHAGOGASTRODUODENOSCOPY N/A 02/25/2017   Procedure:  ESOPHAGOGASTRODUODENOSCOPY (EGD);  Surgeon: Rogene Houston, MD;  [facility];  Service: Endoscopy;  Laterality: N/A;  . IR GENERIC HISTORICAL  01/11/2016   IR RADIOLOGY PERIPHERAL GUIDED IV START 01/11/2016 Saverio Danker, PA-C [facility]  . IR GENERIC HISTORICAL  01/11/2016   IR US GUIDE VASC ACCESS RIGHT 01/11/2016 Saverio Danker, PA-C [facility]  . LYMPH NODE DISSECTION Right 12/30/2018   Procedure: Lymph Node Dissection;  Surgeon: Lajuana Matte, MD;  Location: [facility];  Service: Thoracic;  Laterality: Right;  . MASTECTOMY     left  . OVARIAN CYST SURGERY     x2  . POLYPECTOMY  02/25/2017   Procedure: POLYPECTOMY;  Surgeon: Rogene Houston, MD;  [facility];  Service: Endoscopy;;  . PORTACATH PLACEMENT Left 02/16/2019   Procedure: INSERTION PORT-A-CATH (CATHETER  LEFT SUBCLAVIAN);  Surgeon: Aviva Signs, MD;  Location: [facility];  Service: General;  Laterality: Left;  . SALPINGOOPHORECTOMY Bilateral 12/09/2014   Procedure: SALPINGO OOPHORECTOMY;  Surgeon: Servando Salina, MD;  Location: [facility];  Service: Gynecology;  Laterality: Bilateral;  . TUBAL LIGATION    . VAGINAL HYSTERECTOMY N/A 12/09/2014   Procedure: HYSTERECTOMY VAGINAL;  Surgeon: Servando Salina, MD;  Location: [facility];  Service: Gynecology;  Laterality: N/A;  . VIDEO ASSISTED THORACOSCOPY (VATS)/ LOBECTOMY Right 12/30/2018   Procedure: VIDEO ASSISTED THORACOSCOPY (VATS)/RIGHT LOWER LOBE WEDGE RESECTION, RIGHT UPPER LOBECTOMY;  Surgeon: Lajuana Matte, MD;  Location: [facility];  Service: Thoracic;  Laterality: Right;  Marland Kitchen VIDEO BRONCHOSCOPY N/A 12/30/2018   Procedure: VIDEO BRONCHOSCOPY;  Surgeon: Lajuana Matte, MD;  [facility];  Service: Thoracic;  Laterality: N/A;    SOCIAL HISTORY:  Social History   Socioeconomic History  . Marital status: Divorced    Spouse name: Not on file  . Number of children: 2  . Years of education: Not on file  . Highest education level: Not on file   Occupational History  . Not on file  Tobacco Use  . Smoking status: Former Smoker    Packs/day: 0.50    Years: 50.00    Pack years: 25.00    Types: Cigarettes    Quit date: 11/25/2018    Years since quitting: 1.4  . Smokeless tobacco: Never Used  Vaping Use  . Vaping Use: Never used  Substance and Sexual Activity  . Alcohol use: Yes    Comment: occasional  . Drug use: No  . Sexual activity: Not Currently    Birth control/protection: Post-menopausal  Other Topics Concern  . Not on file  Social History Narrative  . Not on file   Social Determinants of Health   Financial Resource Strain: Low Risk   . Difficulty of Paying Living Expenses: Not hard  at all  Food Insecurity: No Food Insecurity  . Worried About Charity fundraiser in the Last Year: Never true  . Ran Out of Food in the Last Year: Never true  Transportation Needs: No Transportation Needs  . Lack of Transportation (Medical): No  . Lack of Transportation (Non-Medical): No  Physical Activity: Insufficiently Active  . Days of Exercise per Week: 4 days  . Minutes of Exercise per Session: 30 min  Stress: No Stress Concern Present  . Feeling of Stress : Not at all  Social Connections: Moderately Isolated  . Frequency of Communication with Friends and Family: More than three times a week  . Frequency of Social Gatherings with Friends and Family: More than three times a week  . Attends Religious Services: More than 4 times per year  . Active Member of Clubs or Organizations: No  . Attends Archivist Meetings: Never  . Marital Status: Divorced  Human resources officer Violence: Not At Risk  . Fear of Current or Ex-Partner: No  . Emotionally Abused: No  . Physically Abused: No  . Sexually Abused: No    FAMILY HISTORY:  Family History  Problem Relation Age of Onset  . Hypertension Mother   . Heart failure Mother   . Congestive Heart Failure Mother   . COPD Mother   . Pernicious anemia Mother   . Cancer  Mother        lung  . Hypertension Father   . CAD Father   . Heart attack Father   . Hypertension Sister   . Cancer Other   . Celiac disease Other     CURRENT MEDICATIONS:  Current Outpatient Medications  Medication Sig Dispense Refill  . albuterol (PROVENTIL HFA;VENTOLIN HFA) 108 (90 Base) MCG/ACT inhaler Inhale 2 puffs into the lungs every 6 (six) hours as needed for wheezing or shortness of breath. 1 Inhaler 2  . albuterol (PROVENTIL) (2.5 MG/3ML) 0.083% nebulizer solution Take 2.5 mg by nebulization every 6 (six) hours.    Marland Kitchen amLODipine (NORVASC) 2.5 MG tablet 1 tablet    . Calcium Carb-Cholecalciferol 600-800 MG-UNIT TABS Take 1 tablet by mouth 2 (two) times daily.    . Cholecalciferol (VITAMIN D-3) 1000 units CAPS Take 1,000 Units daily by mouth.     . clonazePAM (KLONOPIN) 0.5 MG tablet TAKE ONE TABLET BY MOUTH AT BEDTIME. 90 tablet 4  . cyanocobalamin (,VITAMIN B-12,) 1000 MCG/ML injection INJECT 1 ML INTO THE MUSCLE ONCE MONTHLY AS DIRECTED. (Patient taking differently: Inject 1,000 mcg into the muscle every 30 (thirty) days.) 1 mL 5  . dexamethasone (DECADRON) 2 MG tablet Take 1 tablet (2 mg total) by mouth daily. 30 tablet 3  . diclofenac Sodium (VOLTAREN) 1 % GEL Apply 2 grams to hands    . furosemide (LASIX) 40 MG tablet 1/2 in Am and 1 tablet in PM    . gabapentin (NEURONTIN) 600 MG tablet Take 1 tablet (600 mg total) by mouth daily. 30 tablet 6  . guaiFENesin (MUCINEX) 600 MG 12 hr tablet Take 1 tablet (600 mg total) by mouth 2 (two) times daily as needed for cough or to loosen phlegm.    . ibandronate (BONIVA) 150 MG tablet Take 150 mg by mouth every 30 (thirty) days.     Marland Kitchen ipratropium (ATROVENT) 0.02 % nebulizer solution Take by nebulization.    Marland Kitchen ipratropium-albuterol (DUONEB) 0.5-2.5 (3) MG/3ML SOLN Take 3 mLs by nebulization every 6 (six) hours as needed (shortness of breath). 360 mL 0  .  lidocaine-prilocaine (EMLA) cream Apply a small amount to port a cath site and  cover with plastic wrap 1 hour prior to chemotherapy appointments 30 g 3  . magic mouthwash w/lidocaine SOLN 10 cc swab and swallow    . Meclizine HCl 25 MG CHEW 1 tablet    . meloxicam (MOBIC) 15 MG tablet 1 tablet    . methocarbamol (ROBAXIN) 500 MG tablet Take 500 mg by mouth every 6 (six) hours as needed for muscle spasms.     . metoCLOPramide (REGLAN) 5 MG tablet 1 tablet before meals    . mirtazapine (REMERON) 15 MG tablet Take 1 tablet (15 mg total) by mouth at bedtime. 30 tablet 0  . ondansetron (ZOFRAN ODT) 8 MG disintegrating tablet Take 1 tablet (8 mg total) by mouth every 8 (eight) hours as needed for nausea or vomiting. 60 tablet 2  . OXYGEN Inhale 3 L into the lungs daily.    . pantoprazole (PROTONIX) 40 MG tablet TAKE ONE TABLET BY MOUTH TWICE DAILY. 60 tablet 0  . polyethylene glycol (MIRALAX / GLYCOLAX) 17 g packet Take 17 g by mouth daily as needed for mild constipation.    . potassium chloride SA (KLOR-CON) 20 MEQ tablet Take 1 tablet (20 mEq total) by mouth 3 (three) times daily. (Patient taking differently: Take 20 mEq by mouth daily.) 60 tablet 2  . prochlorperazine (COMPAZINE) 10 MG tablet 1 tablet as needed    . rizatriptan (MAXALT) 10 MG tablet Take 10 mg by mouth 2 (two) times daily. 1-2 times daily May repeat in 2 hours if needed    . rosuvastatin (CRESTOR) 40 MG tablet Take 40 mg by mouth at bedtime.    . sucralfate (CARAFATE) 1 g tablet Take 1 g by mouth 3 (three) times daily.    . traMADol (ULTRAM) 50 MG tablet Take 50 mg by mouth every 6 (six) hours as needed.    . triamcinolone cream (KENALOG) 0.1 %     . aspirin EC 81 MG tablet Take 81 mg by mouth daily.    . ergocalciferol (VITAMIN D2) 1.25 MG (50000 UT) capsule Take by mouth. (Patient not taking: Reported on 05/05/2020)    . fluconazole (DIFLUCAN) 150 MG tablet 1 tablet (Patient not taking: Reported on 05/05/2020)    . hydroxyurea (HYDREA) 500 MG capsule capsule    . ibuprofen (ADVIL) 800 MG tablet Take 1 tablet  (800 mg total) by mouth every 8 (eight) hours as needed for moderate pain. 30 tablet 6  . nitroGLYCERIN (NITRODUR - DOSED IN MG/24 HR) 0.1 mg/hr patch Place 0.1 mg onto the skin daily.  (Patient not taking: Reported on 05/05/2020)    . oxyCODONE (OXY IR/ROXICODONE) 5 MG immediate release tablet Take 1 tablet (5 mg total) by mouth every 8 (eight) hours. 90 tablet 0   No current facility-administered medications for this visit.   Facility-Administered Medications Ordered in Other Visits  Medication Dose Route Frequency Provider Last Rate Last Admin  . 0.9 %  sodium chloride infusion   Intravenous Once Derek Jack, MD      . dexamethasone (DECADRON) 10 mg in sodium chloride 0.9 % 50 mL IVPB  10 mg Intravenous Once Derek Jack, MD      . heparin lock flush 100 unit/mL  500 Units Intracatheter Once PRN Derek Jack, MD      . lurbinectedin [facility]) 2.8 mg in sodium chloride 0.9 % 250 mL chemo infusion  2 mg/m2 (Treatment Plan Recorded) Intravenous Once Derek Jack,  MD      . palonosetron (ALOXI) injection 0.25 mg  0.25 mg Intravenous Once Derek Jack, MD      . sodium chloride flush (NS) 0.9 % injection 10 mL  10 mL Intravenous PRN Lockamy, Randi L, NP-C   10 mL at 12/22/19 1240  . sodium chloride flush (NS) 0.9 % injection 10 mL  10 mL Intracatheter PRN Derek Jack, MD        ALLERGIES:  Allergies  Allergen Reactions  . Penicillins Hives and Rash    Did it involve swelling of the face/tongue/throat, SOB, or low BP? No Did it involve sudden or severe rash/hives, skin peeling, or any reaction on the inside of your mouth or nose? No Did you need to seek medical attention at a hospital or doctor's office? No When did it last happen?5-10 year If all above answers are "NO", may proceed with cephalosporin use.    . Tape Rash    Medipore, Coban, and paper tape CAN be tolerated, Patient states that this can be removed     PHYSICAL EXAM:   Performance status (ECOG): 1 - Symptomatic but completely ambulatory  Vitals:   05/05/20 1249  BP: 113/81  Pulse: 81  Resp: 18  Temp: (!) 97.3 F (36.3 C)  SpO2: 100%   Wt Readings from Last 3 Encounters:  05/05/20 95 lb 1.6 oz (43.1 kg)  04/12/20 91 lb 3.2 oz (41.4 kg)  03/21/20 92 lb 13 oz (42.1 kg)   Physical Exam Vitals reviewed.  Constitutional:      Appearance: Normal appearance.  Chest:     Comments: Port-a-Cath in L chest Neurological:     General: No focal deficit present.     Mental Status: She is alert and oriented to person, place, and time.  Psychiatric:        Mood and Affect: Mood normal.        Behavior: Behavior normal.     LABORATORY DATA:  I have reviewed the labs as listed.  CBC Latest Ref Rng & Units 05/05/2020 04/12/2020 03/21/2020  WBC 4.0 - 10.5 K/uL 10.8(H) 6.8 9.0  Hemoglobin 12.0 - 15.0 g/dL 11.5(L) 11.0(L) 11.5(L)  Hematocrit 36.0 - 46.0 % 36.4 35.8(L) 36.7  Platelets 150 - 400 K/uL 596(H) 589(H) 484(H)   CMP Latest Ref Rng & Units 05/05/2020 04/12/2020 03/21/2020  Glucose 70 - 99 mg/dL 141(H) 96 116(H)  BUN 8 - 23 mg/dL _0 Creatinine 0.44 - 1.00 mg/dL 1.09(H) 1.01(H) 0.89  Sodium 135 - 145 mmol/L 136 137 136  Potassium 3.5 - 5.1 mmol/L 3.2(L) 3.4(L) 3.5  Chloride 98 - 111 mmol/L 100 102 104  CO2 22 - 32 mmol/L _1 Calcium 8.9 - 10.3 mg/dL 9.3 8.6(L) 8.8(L)  Total Protein 6.5 - 8.1 g/dL 6.8 6.3(L) 6.4(L)  Total Bilirubin 0.3 - 1.2 mg/dL <0.1(L) 0.6 0.5  Alkaline Phos 38 - 126 U/L 58 61 58  AST 15 - 41 U/L _2 ALT 0 - 44 U/L _3 DIAGNOSTIC IMAGING:  I have independently reviewed the scans and discussed with the patient. No results found.   ASSESSMENT:  1. Stage III (T3N1) small cell lung cancer: -Right upper lobectomy on 12/30/2018, pathology-4.2 cm small cell lung cancer, invading visceral pleura, 1/3 lymph nodes positive, LVI positive, metastatic carcinoma in 211 or lymph nodes, right chest wall biopsy  consistent with small cell carcinoma. -30 Gy of radiation in 10 fractions from 01/28/2019  through 02/10/2019. -4 cycles of carboplatin and etoposide from 03/09/2019 through 05/11/2019. -PCI completed on 07/31/2019. -We reviewed MRI of the brain on 09/15/2019 with no evidence of brain meta stasis. Concern for hypointense appearance at the C3 vertebral body and left articular process. -CT chest on 09/15/2019 shows numerous new small solid irregular pulmonary nodules scattered throughout both lungs, largest 7 mm on the right lower lobe. Tiny loculated anterior right pleural effusion decreased. Stable mild subcarinal adenopathy. Bilateral stable adrenal adenomas. -MRI of the C-spine on 10/13/2019 showed marrow edema and enhancement involving C3 vertebral body and left articular process without discrete bone lesion. This was thought to be degenerative. -PET scan on 09/28/2019 shows bilateral lung nodules, below PET resolution. Small right-sided nodular densities demonstrating low-level hypermetabolism. Mild hypermetabolic corresponding to a normal-sized subcarinal lymph node. Multifocal right pleural hypermetabolism in the setting of pleural thickening and prior right upper lobectomy, indeterminate. No hypermetabolic extrathoracic disease. -PET scan on 11/24/2019 shows progressive increased soft tissue within the right lung along the suture margins, positive right paratracheal lymph node, small subcapsular focus of increased radiotracer uptake overlying the anterolateral right hepatic lobe concerning for tumor.   PLAN:  1. Stage III (T3N1) small cell lung cancer: -I have cut back on the dose of chemotherapy at last visit because of leg pains. - She reported leg pains lasting about 3 days after last treatment.  She also reported that they were sensitive to touch particularly the left thigh region. - Took some pain medication which helped with the pain. - I have reviewed her labs.  She will proceed with  her next cycle today at reduced dose. - I have recommended PET scan and brain MRI prior to next visit in 3 weeks.  2. Essential thrombocytosis: -Hydrea is on hold since chemotherapy started.  3. Macrocytic anemia: -Hemoglobin 11.5.  Normal MCV.  4. Weight loss: -Continue dexamethasone 1 mg in the mornings.  Continue Remeron 15 mg daily. - Her weight improved by 4 pounds.  5. Mid back and sternal pain: -Continue oxycodone 5 mg 1 tablet in the morning, 1 tablet at noon, 2 tablets at bedtime.  She alternates it with tramadol.  6. Hypokalemia: -Continue potassium 20 mEq daily.   Orders placed this encounter:  Orders Placed This Encounter  Procedures  . MR Brain W Wo Contrast  . NM PET Image Restag (PS) Skull Base To Thigh  . CBC with Differential/Platelet  . Comprehensive metabolic panel     Derek Jack, MD [place] [phone]   I, Milinda Antis, am acting as a scribe for Dr. Sanda Linger.  I, Derek Jack MD, have reviewed the above documentation for accuracy and completeness, and I agree with the above.

## 2020-05-05 NOTE — Patient Instructions (Signed)
[facility] at [facility] Discharge Instructions  Labs drawn from portacath today   Thank you for choosing [facility] at [facility] to provide your oncology and hematology care.  To afford each patient quality time with our provider, please arrive at least 15 minutes before your scheduled appointment time.   If you have a lab appointment with the Cancer Center please come in thru the Main Entrance and check in at the main information desk.  You need to re-schedule your appointment should you arrive 10 or more minutes late.  We strive to give you quality time with our providers, and arriving late affects you and other patients whose appointments are after yours.  Also, if you no show three or more times for appointments you may be dismissed from the clinic at the providers discretion.     Again, thank you for choosing [facility].  Our hope is that these requests will decrease the amount of time that you wait before being seen by our physicians.       _____________________________________________________________  Should you have questions after your visit to [facility], please contact our office at [phone] and follow the prompts.  Our office hours are 8:00 a.m. and 4:30 p.m. Monday - Friday.  Please note that voicemails left after 4:00 p.m. may not be returned until the following business day.  We are closed weekends and major holidays.  You do have access to a nurse 24-7, just call the main number to the clinic [phone] and do not press any options, hold on the line and a nurse will answer the phone.    For prescription refill requests, have your pharmacy contact our office and allow 72 hours.    Due to Covid, you will need to wear a mask upon entering the hospital. If you do not have a mask, a mask will be given to you at the Main Entrance upon arrival. For doctor visits, patients may have 1 support person age 18  or older with them. For treatment visits, patients can not have anyone with them due to social distancing guidelines and our immunocompromised population.     

## 2020-05-05 NOTE — Progress Notes (Signed)
Patient assessed and labs reviewed by Dr. Katragadda. Okay to proceed with treatment. Primary RN and pharmacy aware. 

## 2020-05-10 ENCOUNTER — Encounter (HOSPITAL_COMMUNITY): Payer: Self-pay

## 2020-05-11 ENCOUNTER — Other Ambulatory Visit (HOSPITAL_COMMUNITY): Payer: Self-pay

## 2020-05-13 ENCOUNTER — Other Ambulatory Visit: Payer: Self-pay

## 2020-05-16 ENCOUNTER — Other Ambulatory Visit (HOSPITAL_COMMUNITY): Payer: Self-pay

## 2020-05-20 ENCOUNTER — Ambulatory Visit (HOSPITAL_COMMUNITY): Payer: Medicare Other

## 2020-05-20 NOTE — Progress Notes (Signed)
Nutrition Follow-up:  Patient with lung cancer.  Patient is receiving chemotherapy.    Spoke with patient via phone for nutrition follow-up.  Patient reports that her appetite is good and medications are working (dexamethasone and remeron).  Eating peanut butter crackers during phone visit.  Last night for dinner ate all vegetables.  Eating ice cream.  Is not drinking oral nutrition supplements or making homemade shakes.  Does not like oral nutrition supplements    Medications: remeron dexamethasone  Labs: reviewed  Anthropometrics:   Weight 95 lb 1.6 oz on 1/20 increased from 91 lb   NUTRITION DIAGNOSIS: Inadequate oral intake continues   INTERVENTION:  Patient to continue appetite stimulant Encouraged patient to eat dried bean when having all vegetable meal for extra protein.  Patient requested mailing of foods high in protein. Will resend information    MONITORING, EVALUATION, GOAL: weight trends, intake   NEXT VISIT: Mar 11 phone call  Ashley [name] B. Ashley [name], [place], [place] Registered Dietitian [phone] (mobile)

## 2020-05-24 ENCOUNTER — Other Ambulatory Visit: Payer: Self-pay

## 2020-05-24 ENCOUNTER — Ambulatory Visit
Admission: RE | Admit: 2020-05-24 | Discharge: 2020-05-24 | Disposition: A | Discharge status: Home / Self Care | Payer: Medicare Other | Source: Ambulatory Visit | Attending: Hematology | Admitting: Hematology

## 2020-05-24 DIAGNOSIS — C3491 Malignant neoplasm of unspecified part of right bronchus or lung: Secondary | ICD-10-CM

## 2020-05-24 MED ORDER — HEPARIN SOD (PORK) LOCK FLUSH 100 UNIT/ML IV SOLN
500.0000 [IU] | Freq: Once | INTRAVENOUS | Status: AC
  Administered 2020-05-24: 500 [IU] via INTRAVENOUS

## 2020-05-24 MED ORDER — SODIUM CHLORIDE 0.9% FLUSH
10.0000 mL | Freq: Once | INTRAVENOUS | Status: AC
  Administered 2020-05-24: 10 mL via INTRAVENOUS

## 2020-05-24 MED ORDER — GADOBENATE DIMEGLUMINE 529 MG/ML IV SOLN
9.0000 mL | Freq: Once | INTRAVENOUS | Status: AC | PRN
  Administered 2020-05-24: 9 mL via INTRAVENOUS

## 2020-05-26 ENCOUNTER — Ambulatory Visit (HOSPITAL_COMMUNITY)
Admission: RE | Admit: 2020-05-26 | Discharge: 2020-05-26 | Disposition: A | Discharge status: Home / Self Care | Payer: Medicare Other | Source: Ambulatory Visit | Attending: Hematology | Admitting: Hematology

## 2020-05-26 ENCOUNTER — Other Ambulatory Visit: Payer: Self-pay

## 2020-05-26 DIAGNOSIS — J439 Emphysema, unspecified: Secondary | ICD-10-CM | POA: Insufficient documentation

## 2020-05-26 DIAGNOSIS — R590 Localized enlarged lymph nodes: Secondary | ICD-10-CM | POA: Insufficient documentation

## 2020-05-26 DIAGNOSIS — I700 Atherosclerosis of aorta: Secondary | ICD-10-CM | POA: Diagnosis not present

## 2020-05-26 DIAGNOSIS — C3491 Malignant neoplasm of unspecified part of right bronchus or lung: Secondary | ICD-10-CM | POA: Insufficient documentation

## 2020-05-26 LAB — GLUCOSE, CAPILLARY: Glucose-Capillary: 89 mg/dL (ref 70–99)

## 2020-05-26 MED ORDER — FLUDEOXYGLUCOSE F - 18 (FDG) INJECTION
5.0000 | Freq: Once | INTRAVENOUS | Status: AC | PRN
  Administered 2020-05-26: 5 via INTRAVENOUS

## 2020-05-30 ENCOUNTER — Ambulatory Visit (HOSPITAL_COMMUNITY): Payer: Medicare Other

## 2020-05-30 ENCOUNTER — Other Ambulatory Visit (HOSPITAL_COMMUNITY): Payer: Medicare Other

## 2020-05-30 ENCOUNTER — Ambulatory Visit (HOSPITAL_COMMUNITY): Payer: Medicare Other | Admitting: Hematology

## 2020-05-31 ENCOUNTER — Inpatient Hospital Stay (HOSPITAL_COMMUNITY): Payer: Medicare Other

## 2020-05-31 ENCOUNTER — Inpatient Hospital Stay (HOSPITAL_COMMUNITY): Payer: Medicare Other | Attending: Hematology | Admitting: Hematology

## 2020-05-31 ENCOUNTER — Other Ambulatory Visit: Payer: Self-pay

## 2020-05-31 VITALS — BP 117/66 | HR 79 | Temp 96.8°F | Resp 16 | Wt 97.5 lb

## 2020-05-31 DIAGNOSIS — Z5111 Encounter for antineoplastic chemotherapy: Secondary | ICD-10-CM | POA: Diagnosis present

## 2020-05-31 DIAGNOSIS — M8580 Other specified disorders of bone density and structure, unspecified site: Secondary | ICD-10-CM | POA: Diagnosis not present

## 2020-05-31 DIAGNOSIS — C3491 Malignant neoplasm of unspecified part of right bronchus or lung: Secondary | ICD-10-CM

## 2020-05-31 DIAGNOSIS — I10 Essential (primary) hypertension: Secondary | ICD-10-CM | POA: Insufficient documentation

## 2020-05-31 DIAGNOSIS — Z9012 Acquired absence of left breast and nipple: Secondary | ICD-10-CM | POA: Diagnosis not present

## 2020-05-31 DIAGNOSIS — D510 Vitamin B12 deficiency anemia due to intrinsic factor deficiency: Secondary | ICD-10-CM | POA: Insufficient documentation

## 2020-05-31 DIAGNOSIS — D473 Essential (hemorrhagic) thrombocythemia: Secondary | ICD-10-CM | POA: Insufficient documentation

## 2020-05-31 DIAGNOSIS — Z87891 Personal history of nicotine dependence: Secondary | ICD-10-CM | POA: Insufficient documentation

## 2020-05-31 DIAGNOSIS — Z853 Personal history of malignant neoplasm of breast: Secondary | ICD-10-CM | POA: Diagnosis not present

## 2020-05-31 DIAGNOSIS — Z79899 Other long term (current) drug therapy: Secondary | ICD-10-CM | POA: Insufficient documentation

## 2020-05-31 DIAGNOSIS — Z7982 Long term (current) use of aspirin: Secondary | ICD-10-CM | POA: Insufficient documentation

## 2020-05-31 DIAGNOSIS — C3411 Malignant neoplasm of upper lobe, right bronchus or lung: Secondary | ICD-10-CM | POA: Insufficient documentation

## 2020-05-31 LAB — CBC WITH DIFFERENTIAL/PLATELET
Abs Immature Granulocytes: 0.04 10*3/uL (ref 0.00–0.07)
Basophils Absolute: 0.1 10*3/uL (ref 0.0–0.1)
Basophils Relative: 1 %
Eosinophils Absolute: 0.3 10*3/uL (ref 0.0–0.5)
Eosinophils Relative: 2 %
HCT: 39.8 % (ref 36.0–46.0)
Hemoglobin: 12.4 g/dL (ref 12.0–15.0)
Immature Granulocytes: 0 %
Lymphocytes Relative: 11 %
Lymphs Abs: 1.1 10*3/uL (ref 0.7–4.0)
MCH: 28.4 pg (ref 26.0–34.0)
MCHC: 31.2 g/dL (ref 30.0–36.0)
MCV: 91.3 fL (ref 80.0–100.0)
Monocytes Absolute: 0.6 10*3/uL (ref 0.1–1.0)
Monocytes Relative: 6 %
Neutro Abs: 8.4 10*3/uL — ABNORMAL HIGH (ref 1.7–7.7)
Neutrophils Relative %: 80 %
Platelets: 554 10*3/uL — ABNORMAL HIGH (ref 150–400)
RBC: 4.36 MIL/uL (ref 3.87–5.11)
RDW: 16.1 % — ABNORMAL HIGH (ref 11.5–15.5)
WBC: 10.4 10*3/uL (ref 4.0–10.5)
nRBC: 0 % (ref 0.0–0.2)

## 2020-05-31 LAB — COMPREHENSIVE METABOLIC PANEL
ALT: 13 U/L (ref 0–44)
AST: 23 U/L (ref 15–41)
Albumin: 3.5 g/dL (ref 3.5–5.0)
Alkaline Phosphatase: 58 U/L (ref 38–126)
Anion gap: 7 (ref 5–15)
BUN: 17 mg/dL (ref 8–23)
CO2: 26 mmol/L (ref 22–32)
Calcium: 9.6 mg/dL (ref 8.9–10.3)
Chloride: 104 mmol/L (ref 98–111)
Creatinine, Ser: 0.89 mg/dL (ref 0.44–1.00)
GFR, Estimated: >60 mL/min (ref >60)
Glucose, Bld: 170 mg/dL — ABNORMAL HIGH (ref 70–99)
Potassium: 3.3 mmol/L — ABNORMAL LOW (ref 3.5–5.1)
Sodium: 137 mmol/L (ref 135–145)
Total Bilirubin: 0.3 mg/dL (ref 0.3–1.2)
Total Protein: 6.6 g/dL (ref 6.5–8.1)

## 2020-05-31 MED ORDER — HEPARIN SOD (PORK) LOCK FLUSH 100 UNIT/ML IV SOLN
500.0000 [IU] | Freq: Once | INTRAVENOUS | Status: AC
  Administered 2020-05-31: 500 [IU] via INTRAVENOUS

## 2020-05-31 MED ORDER — SODIUM CHLORIDE 0.9% FLUSH
10.0000 mL | INTRAVENOUS | Status: DC | PRN
  Administered 2020-05-31: 10 mL via INTRAVENOUS

## 2020-05-31 NOTE — Progress Notes (Signed)
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 Right upper lobectomy on 12/30/2018. 2. Radiation to whole brain 25 Gy in 10 fractions from 01/28/2019 through 02/10/2019. 3. Carboplatin and etoposide x 4 cycles from 03/09/2019 through 05/11/2019.  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 The 3 liver lesions are not hypermetabolic. The radiologist suspects they may be subtly present on prior CT chest from 10/2013, to further reassuring that these are likely benign lesions.   Melanoma ([place])  07/09/2013 Initial Biopsy   Initial biopsy L upper thigh/buttocks melanoma   07/20/2013 Surgery   Excision L lateral buttock/upper thigh melanoma, clear margins   07/20/2013 Pathology Results   Breslow depth 1.02 mm, pT2a    Small cell lung carcinoma, right ([facility] [place])  11/27/2018 Initial Diagnosis   Small cell lung carcinoma, right ([place])   03/09/2019 - 05/15/2019 Chemotherapy   The patient had palonosetron (ALOXI) injection 0.25 mg, 0.25 mg, Intravenous,  Once, 4 of 4 cycles Administration: 0.25 mg (03/09/2019), 0.25 mg (03/30/2019), 0.25 mg (04/20/2019), 0.25 mg (05/11/2019) pegfilgrastim (NEULASTA ONPRO KIT)  injection 6 mg, 6 mg, Subcutaneous, Once, 1 of 1 cycle Administration: 6 mg (03/11/2019) pegfilgrastim-jmdb (FULPHILA) injection 6 mg, 6 mg, Subcutaneous,  Once, 3 of 3 cycles Administration: 6 mg (04/03/2019), 6 mg (04/24/2019), 6 mg (05/15/2019) CARBOplatin (PARAPLATIN) 330 mg in sodium chloride 0.9 % 250 mL chemo infusion, 330 mg (100 % of original dose 325.5 mg), Intravenous,  Once, 4 of 4 cycles Dose modification:   (original dose 325.5 mg, Cycle 1),   (original dose 325.5 mg, Cycle 2),   (original dose 309 mg, Cycle 3),   (original dose 325.5 mg, Cycle 4) Administration: 330 mg (03/09/2019), 330 mg (03/30/2019), 310 mg (04/20/2019), 330 mg (05/11/2019) etoposide (VEPESID) 150 mg in sodium chloride 0.9 % 500 mL chemo infusion, 100 mg/m2 = 150 mg, Intravenous,  Once, 4 of 4 cycles Administration: 150 mg (03/09/2019), 150 mg (03/10/2019), 150 mg (03/11/2019), 150 mg (03/30/2019), 150 mg (03/31/2019), 150 mg (04/01/2019), 150 mg (04/20/2019), 150 mg (04/21/2019), 150 mg (04/22/2019), 150 mg (05/11/2019), 150 mg (05/12/2019), 150 mg (05/13/2019) fosaprepitant (EMEND) 150 mg, dexamethasone (DECADRON) 12 mg in sodium chloride 0.9 % 145 mL IVPB, , Intravenous,  Once, 4 of 4 cycles Administration:  (03/09/2019),  (03/30/2019),  (04/20/2019),  (05/11/2019)  for chemotherapy treatment.    03/09/2019 Cancer Staging   Staging form: Lung, AJCC 8th Edition - Clinical: Stage IIIA (cT3, cN1, cM0) - Signed by Derek Jack, MD on 03/09/2019   12/15/2019 -  Chemotherapy    Patient is on Treatment Plan: LUNG SMALL CELL LURBINECTEDIN Q21D  CANCER STAGING: Cancer Staging Small cell lung carcinoma, right (HCC) Staging form: Lung, AJCC 8th Edition - Clinical: Stage IIIA (cT3, cN1, cM0) - Signed by Derek Jack, MD on 03/09/2019   INTERVAL HISTORY:  Ms. LYNNE TAKEMOTO, a 70 y.o. female, returns for routine follow-up and consideration for next cycle of chemotherapy. Minetta was last seen on  05/05/2020.  Due for cycle #8 of lurbinectedin today.   Today she is accompanied by her daughter. Overall, she tells me she has been feeling poorly. She reports that her energy levels have been low, as well as a new headache since her last treatment. The headache is mainly around her ear, eye and lower forehead, and gives her a sensation as if her eye is going to pop out of her head. The vision in her right eye has worsened. Her appetite is okay and has gained weight eating ice cream and junk food.  Given progression of her cancer, she will be switched to topotecan.   REVIEW OF SYSTEMS:  Review of Systems  Constitutional: Positive for appetite change (50%) and fatigue (25%). Negative for unexpected weight change.  Eyes: Positive for eye problems (worsening in R eye).  Respiratory: Positive for shortness of breath (w/ exertion).   Cardiovascular: Positive for chest pain.  Gastrointestinal: Positive for constipation and nausea.  Neurological: Positive for headaches (R side of head) and numbness.  All other systems reviewed and are negative.   PAST MEDICAL/SURGICAL HISTORY:  Past Medical History:  Diagnosis Date  . 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 Anginal pain ([facility])   . Arthritis   . Ascending aortic aneurysm ([place])   . Cancer [facility]) 1995   breast, left, mastectomy/chemo  . Chest pain    Possibly cardiac. No evidence of ischemia/injury based upon normal troponin I. Chest discomfort could be tachycardia induced supply demand mismatch.   . CHF (congestive heart failure) ([place]) 11/17/2015   after surgery   . Colon adenomas   . Coronary artery disease   . DJD (degenerative joint disease)   . Dyspnea    with exertion  . Emphysema of lung ([place]) 11/17/2015  . Essential hypertension, benign   . GERD (gastroesophageal reflux disease)   . History of hiatal hernia   . Hyperlipidemia   . Hypertension   . Melanoma ([place]) 01/09/2016  . Osteopenia   . Palpitations   . Pernicious  anemia 03/06/2016  . Pernicious anemia   . Pure hypercholesterolemia   . Raynaud's disease   . 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 Thrombocytosis    Idiopathic  . Vitamin D deficiency    Past Surgical History:  Procedure Laterality Date  . ABDOMINAL HYSTERECTOMY    . ANTERIOR AND POSTERIOR REPAIR N/A 12/09/2014   Procedure: ANTERIOR (CYSTOCELE) AND POSTERIOR REPAIR (RECTOCELE);  Surgeon: Bjorn Loser, MD;  Location: [facility];  Service: Urology;  Laterality: N/A;  . ANTERIOR CERVICAL DECOMPRESSION/DISCECTOMY FUSION 4 LEVELS Right 10/03/2016   Procedure: ANTERIOR CERVICAL DECOMPRESSION FUSION, CERVICAL 4-5, CERVICAL 5-6, CERVICAL 6-7, CERVICAL 7 TO THORACIC 1 WITH INSTRUMENTATION AND ALLOGRAFT;  Surgeon: Phylliss Bob, MD;  Location: [facility];  Service: Orthopedics;  Laterality: Right;  ANTERIOR CERVICAL DECOMPRESSION FUSION, CERVICAL 4-5, CERVICAL 5-6, CERVICAL 6-7, CERVICAL 7 TO THORACIC 1 WITH INSTRUMENTATION AND ALLOGRAFT; REQUEST 4 HO  . ANTERIOR LAT LUMBAR FUSION Left 11/16/2015   Procedure: LEFT SIDED LATERAL INTERBODY FUSION, LUMBAR 2-3, LUMBAR 3-4, LUMBAR 4-5 WITH INSTRUMENTATION;  Surgeon: Phylliss Bob, MD;  Location: [facility];  Service: Orthopedics;  Laterality: Left;  LEFT SIDED LATEARL INTERBODY FUSION, LUMBAR 2-3, LUMBAR 3-4, LUMBAR 4-5 WITH INSTRUMENTATION   . APPENDECTOMY    . BACK SURGERY    . BONE MARROW ASPIRATION  07/2012  . BONE MARROW BIOPSY  07/2012  . BREAST SURGERY    . CARDIAC CATHETERIZATION    . CARDIAC CATHETERIZATION N/A 01/20/2016   Procedure: Left Heart Cath and Coronary Angiography;  Surgeon: Burnell Blanks, MD;  Location: [facility];  Service: Cardiovascular;  Laterality: N/A;  . COLONOSCOPY  11/29/2010   Procedure: COLONOSCOPY;  Surgeon: Rogene Houston, MD;  [facility];  Service: Endoscopy;  Laterality: N/A;  . COLONOSCOPY N/A 02/18/2014   Procedure: COLONOSCOPY;  Surgeon: Rogene Houston, MD;  [facility];   Service: Endoscopy;  Laterality: N/A;  1030  . COLONOSCOPY N/A 02/25/2017   Procedure: COLONOSCOPY;  Surgeon: Rogene Houston, MD;  [facility];  Service: Endoscopy;  Laterality: N/A;  10:55  . CYSTOSCOPY N/A 12/09/2014   Procedure: CYSTOSCOPY;  Surgeon: Bjorn Loser, MD;  Location: [facility];  Service: Urology;  Laterality: N/A;  . ESOPHAGEAL DILATION N/A 02/25/2017   Procedure: ESOPHAGEAL DILATION;  Surgeon: Rogene Houston, MD;  [facility];  Service: Endoscopy;  Laterality: N/A;  . ESOPHAGOGASTRODUODENOSCOPY N/A 02/25/2017   Procedure: ESOPHAGOGASTRODUODENOSCOPY (EGD);  Surgeon: Rogene Houston, MD;  [facility];  Service: Endoscopy;  Laterality: N/A;  . IR GENERIC HISTORICAL  01/11/2016   IR RADIOLOGY PERIPHERAL GUIDED IV START 01/11/2016 Saverio Danker, PA-C [facility]  . IR GENERIC HISTORICAL  01/11/2016   IR US GUIDE VASC ACCESS RIGHT 01/11/2016 Saverio Danker, PA-C [facility]  . LYMPH NODE DISSECTION Right 12/30/2018   Procedure: Lymph Node Dissection;  Surgeon: Lajuana Matte, MD;  Location: [facility];  Service: Thoracic;  Laterality: Right;  . MASTECTOMY     left  . OVARIAN CYST SURGERY     x2  . POLYPECTOMY  02/25/2017   Procedure: POLYPECTOMY;  Surgeon: Rogene Houston, MD;  [facility];  Service: Endoscopy;;  . PORTACATH PLACEMENT Left 02/16/2019   Procedure: INSERTION PORT-A-CATH (CATHETER  LEFT SUBCLAVIAN);  Surgeon: Aviva Signs, MD;  Location: [facility];  Service: General;  Laterality: Left;  . SALPINGOOPHORECTOMY Bilateral 12/09/2014   Procedure: SALPINGO OOPHORECTOMY;  Surgeon: Servando Salina, MD;  Location: [facility];  Service: Gynecology;  Laterality: Bilateral;  . TUBAL LIGATION    . VAGINAL HYSTERECTOMY N/A 12/09/2014   Procedure: HYSTERECTOMY VAGINAL;  Surgeon: Servando Salina, MD;  Location: [facility];  Service: Gynecology;  Laterality: N/A;  . VIDEO ASSISTED THORACOSCOPY (VATS)/ LOBECTOMY Right 12/30/2018    Procedure: VIDEO ASSISTED THORACOSCOPY (VATS)/RIGHT LOWER LOBE WEDGE RESECTION, RIGHT UPPER LOBECTOMY;  Surgeon: Lajuana Matte, MD;  Location: [facility];  Service: Thoracic;  Laterality: Right;  Marland Kitchen VIDEO BRONCHOSCOPY N/A 12/30/2018   Procedure: VIDEO BRONCHOSCOPY;  Surgeon: Lajuana Matte, MD;  [facility];  Service: Thoracic;  Laterality: N/A;    SOCIAL HISTORY:  Social History   Socioeconomic History  . Marital status: Divorced    Spouse name: Not on file  . Number of children: 2  . Years of education: Not on file  . Highest education level: Not on file  Occupational History  . Not on file  Tobacco Use  . Smoking status: Former Smoker    Packs/day: 0.50    Years: 50.00    Pack years: 25.00    Types: Cigarettes  Quit date: 11/25/2018    Years since quitting: 1.5  . Smokeless tobacco: Never Used  Vaping Use  . Vaping Use: Never used  Substance and Sexual Activity  . Alcohol use: Yes    Comment: occasional  . Drug use: No  . Sexual activity: Not Currently    Birth control/protection: Post-menopausal  Other Topics Concern  . Not on file  Social History Narrative  . Not on file   Social Determinants of Health   Financial Resource Strain: Low Risk   . Difficulty of Paying Living Expenses: Not hard at all  Food Insecurity: No Food Insecurity  . Worried About Charity fundraiser in the Last Year: Never true  . Ran Out of Food in the Last Year: Never true  Transportation Needs: No Transportation Needs  . Lack of Transportation (Medical): No  . Lack of Transportation (Non-Medical): No  Physical Activity: Insufficiently Active  . Days of Exercise per Week: 4 days  . Minutes of Exercise per Session: 30 min  Stress: No Stress Concern Present  . Feeling of Stress : Not at all  Social Connections: Moderately Isolated  . Frequency of Communication with Friends and Family: More than three times a week  . Frequency of Social Gatherings with Friends and Family: More  than three times a week  . Attends Religious Services: More than 4 times per year  . Active Member of Clubs or Organizations: No  . Attends Archivist Meetings: Never  . Marital Status: Divorced  Human resources officer Violence: Not At Risk  . Fear of Current or Ex-Partner: No  . Emotionally Abused: No  . Physically Abused: No  . Sexually Abused: No    FAMILY HISTORY:  Family History  Problem Relation Age of Onset  . Hypertension Mother   . Heart failure Mother   . Congestive Heart Failure Mother   . COPD Mother   . Pernicious anemia Mother   . Cancer Mother        lung  . Hypertension Father   . CAD Father   . Heart attack Father   . Hypertension Sister   . Cancer Other   . Celiac disease Other     CURRENT MEDICATIONS:  Current Outpatient Medications  Medication Sig Dispense Refill  . albuterol (PROVENTIL HFA;VENTOLIN HFA) 108 (90 Base) MCG/ACT inhaler Inhale 2 puffs into the lungs every 6 (six) hours as needed for wheezing or shortness of breath. 1 Inhaler 2  . albuterol (PROVENTIL) (2.5 MG/3ML) 0.083% nebulizer solution Take 2.5 mg by nebulization every 6 (six) hours.    Marland Kitchen amLODipine (NORVASC) 2.5 MG tablet 1 tablet    . Calcium Carb-Cholecalciferol 600-800 MG-UNIT TABS Take 1 tablet by mouth 2 (two) times daily.    . cefdinir (OMNICEF) 300 MG capsule Take 300 mg by mouth 2 (two) times daily.    . Cholecalciferol (VITAMIN D-3) 1000 units CAPS Take 1,000 Units daily by mouth.     . clonazePAM (KLONOPIN) 0.5 MG tablet TAKE ONE TABLET BY MOUTH AT BEDTIME. 90 tablet 4  . cyanocobalamin (,VITAMIN B-12,) 1000 MCG/ML injection INJECT 1 ML INTO THE MUSCLE ONCE MONTHLY AS DIRECTED. (Patient taking differently: Inject 1,000 mcg into the muscle every 30 (thirty) days.) 1 mL 5  . dexamethasone (DECADRON) 2 MG tablet Take 1 tablet (2 mg total) by mouth daily. 30 tablet 3  . diclofenac Sodium (VOLTAREN) 1 % GEL Apply 2 grams to hands    . ergocalciferol (VITAMIN D2) 1.25 MG (50000  UT) capsule Take by mouth.    . fluconazole (DIFLUCAN) 150 MG tablet     . furosemide (LASIX) 40 MG tablet 1/2 in Am and 1 tablet in PM    . gabapentin (NEURONTIN) 600 MG tablet Take 1 tablet (600 mg total) by mouth daily. 30 tablet 6  . guaiFENesin (MUCINEX) 600 MG 12 hr tablet Take 1 tablet (600 mg total) by mouth 2 (two) times daily as needed for cough or to loosen phlegm.    . hydroxyurea (HYDREA) 500 MG capsule capsule    . ibandronate (BONIVA) 150 MG tablet Take 150 mg by mouth every 30 (thirty) days.     Marland Kitchen ibuprofen (ADVIL) 800 MG tablet Take 1 tablet (800 mg total) by mouth every 8 (eight) hours as needed for moderate pain. 30 tablet 6  . ipratropium (ATROVENT) 0.02 % nebulizer solution Take by nebulization.    Marland Kitchen ipratropium-albuterol (DUONEB) 0.5-2.5 (3) MG/3ML SOLN Take 3 mLs by nebulization every 6 (six) hours as needed (shortness of breath). 360 mL 0  . lidocaine-prilocaine (EMLA) cream Apply a small amount to port a cath site and cover with plastic wrap 1 hour prior to chemotherapy appointments 30 g 3  . magic mouthwash w/lidocaine SOLN 10 cc swab and swallow    . Meclizine HCl 25 MG CHEW 1 tablet    . meloxicam (MOBIC) 15 MG tablet 1 tablet    . methocarbamol (ROBAXIN) 500 MG tablet Take 500 mg by mouth every 6 (six) hours as needed for muscle spasms.     . metoCLOPramide (REGLAN) 5 MG tablet 1 tablet before meals    . mirtazapine (REMERON) 15 MG tablet Take 1 tablet (15 mg total) by mouth at bedtime. 30 tablet 0  . nitroGLYCERIN (NITRODUR - DOSED IN MG/24 HR) 0.1 mg/hr patch Place 0.1 mg onto the skin daily.    . ondansetron (ZOFRAN ODT) 8 MG disintegrating tablet Take 1 tablet (8 mg total) by mouth every 8 (eight) hours as needed for nausea or vomiting. 60 tablet 2  . oxyCODONE (OXY IR/ROXICODONE) 5 MG immediate release tablet Take 1 tablet (5 mg total) by mouth every 8 (eight) hours. 90 tablet 0  . OXYGEN Inhale 3 L into the lungs daily.    . pantoprazole (PROTONIX) 40 MG tablet  TAKE ONE TABLET BY MOUTH TWICE DAILY. 60 tablet 0  . polyethylene glycol (MIRALAX / GLYCOLAX) 17 g packet Take 17 g by mouth daily as needed for mild constipation.    . potassium chloride SA (KLOR-CON) 20 MEQ tablet Take 1 tablet (20 mEq total) by mouth 3 (three) times daily. (Patient taking differently: Take 20 mEq by mouth daily.) 60 tablet 2  . prochlorperazine (COMPAZINE) 10 MG tablet 1 tablet as needed    . rizatriptan (MAXALT) 10 MG tablet Take 10 mg by mouth 2 (two) times daily. 1-2 times daily May repeat in 2 hours if needed    . rosuvastatin (CRESTOR) 40 MG tablet Take 40 mg by mouth at bedtime.    . sucralfate (CARAFATE) 1 g tablet Take 1 g by mouth 3 (three) times daily.    . traMADol (ULTRAM) 50 MG tablet Take 50 mg by mouth every 6 (six) hours as needed.    . triamcinolone cream (KENALOG) 0.1 %     . aspirin EC 81 MG tablet Take 81 mg by mouth daily.     No current facility-administered medications for this visit.   Facility-Administered Medications Ordered in Other Visits  Medication Dose  Route Frequency Provider Last Rate Last Admin  . sodium chloride flush (NS) 0.9 % injection 10 mL  10 mL Intravenous PRN Lockamy, Randi L, NP-C   10 mL at 12/22/19 1240    ALLERGIES:  Allergies  Allergen Reactions  . Penicillins Hives and Rash    Did it involve swelling of the face/tongue/throat, SOB, or low BP? No Did it involve sudden or severe rash/hives, skin peeling, or any reaction on the inside of your mouth or nose? No Did you need to seek medical attention at a hospital or doctor's office? No When did it last happen?5-10 year If all above answers are "NO", may proceed with cephalosporin use.    . Tape Rash    Medipore, Coban, and paper tape CAN be tolerated, Patient states that this can be removed     PHYSICAL EXAM:  Performance status (ECOG): 1 - Symptomatic but completely ambulatory  Vitals:   05/31/20 1253  BP: 117/66  Pulse: 79  Resp: 16  Temp: (!) 96.8 F  (36 C)  SpO2: 99%   Wt Readings from Last 3 Encounters:  05/31/20 97 lb 8 oz (44.2 kg)  05/05/20 95 lb 1.6 oz (43.1 kg)  04/12/20 91 lb 3.2 oz (41.4 kg)   Physical Exam Vitals reviewed.  Constitutional:      Appearance: Normal appearance.  Cardiovascular:     Rate and Rhythm: Normal rate and regular rhythm.     Pulses: Normal pulses.     Heart sounds: Normal heart sounds.  Pulmonary:     Effort: Pulmonary effort is normal.     Breath sounds: Normal breath sounds.  Chest:     Comments: Port-a-Cath in L chest Neurological:     General: No focal deficit present.     Mental Status: She is alert and oriented to person, place, and time.  Psychiatric:        Mood and Affect: Mood normal.        Behavior: Behavior normal.     LABORATORY DATA:  I have reviewed the labs as listed.  CBC Latest Ref Rng & Units 05/31/2020 05/05/2020 04/12/2020  WBC 4.0 - 10.5 K/uL 10.4 10.8(H) 6.8  Hemoglobin 12.0 - 15.0 g/dL 12.4 11.5(L) 11.0(L)  Hematocrit 36.0 - 46.0 % 39.8 36.4 35.8(L)  Platelets 150 - 400 K/uL 554(H) 596(H) 589(H)   CMP Latest Ref Rng & Units 05/31/2020 05/05/2020 04/12/2020  Glucose 70 - 99 mg/dL 170(H) 141(H) 96  BUN 8 - 23 mg/dL '17 19 14  ' Creatinine 0.44 - 1.00 mg/dL 0.89 1.09(H) 1.01(H)  Sodium 135 - 145 mmol/L 137 136 137  Potassium 3.5 - 5.1 mmol/L 3.3(L) 3.2(L) 3.4(L)  Chloride 98 - 111 mmol/L 104 100 102  CO2 22 - 32 mmol/L '26 28 27  ' Calcium 8.9 - 10.3 mg/dL 9.6 9.3 8.6(L)  Total Protein 6.5 - 8.1 g/dL 6.6 6.8 6.3(L)  Total Bilirubin 0.3 - 1.2 mg/dL 0.3 <0.1(L) 0.6  Alkaline Phos 38 - 126 U/L 58 58 61  AST 15 - 41 U/L '23 23 22  ' ALT 0 - 44 U/L '13 13 12    ' DIAGNOSTIC IMAGING:  I have independently reviewed the scans and discussed with the patient. MR Brain W Wo Contrast  Result Date: 05/24/2020 CLINICAL DATA:  Small cell lung cancer, assess treatment response. EXAM: MRI HEAD WITHOUT AND WITH CONTRAST TECHNIQUE: Multiplanar, multiecho pulse sequences of the brain  and surrounding structures were obtained without and with intravenous contrast. CONTRAST:  65m MULTIHANCE GADOBENATE DIMEGLUMINE  529 MG/ML IV SOLN Contrast administration was performed via previously implanted port catheter, assessed by a registered nurse. COMPARISON:  MRI of the brain September 15, 2019 FINDINGS: Brain: No acute infarction, hemorrhage, hydrocephalus or extra-axial collection. New subependymal enhancing lesion in the atrium of the right lateral ventricle measuring approximately 2.1 x 2.0 x 0.8 cm. Subependymal extension of contrast enhancement along the right occipital horn. Surrounding vasogenic edema is seen in the adjacent white matter. No other enhancing lesion identified. Vascular: Normal flow voids. Skull and upper cervical spine: Improved marrow signal of the C3 vertebral body. Sinuses/Orbits: Negative. Other: Bilateral mastoid effusion, more pronounced on the right IMPRESSION: 1. New subependymal enhancing lesion in the atrium of the right lateral ventricle measuring 2.1 cm, concerning for metastatic disease. Subependymal extension of contrast enhancement along the right occipital horn. 2. Improved marrow signal of the C3 vertebral body, likely degenerative. 3. Bilateral mastoid effusion, more pronounced on the right. Electronically Signed   By: Pedro Earls M.D.   On: 05/24/2020 15:18   NM PET Image Restag (PS) Skull Base To Thigh  Result Date: 05/27/2020 CLINICAL DATA:  Subsequent treatment strategy for small-cell lung cancer. EXAM: NUCLEAR MEDICINE PET SKULL BASE TO THIGH TECHNIQUE: 5.0 mCi F-18 FDG was injected intravenously. Full-ring PET imaging was performed from the skull base to thigh after the radiotracer. CT data was obtained and used for attenuation correction and anatomic localization. Fasting blood glucose: 89 mg/dl COMPARISON:  02/12/2020. FINDINGS: Mediastinal blood pool activity: SUV max 1.9 Liver activity: SUV max NA NECK: No hypermetabolic lymph nodes in the  neck. Incidental CT findings: none CHEST: Volume loss right hemithorax compatible with history of prior right upper lobectomy. Interval development of a new 2.7 cm short axis nodal lesion in the subcarinal station with SUV max = 8.7. New hypermetabolic lymphadenopathy in the right hilum is not well demonstrated on noncontrast CT imaging with SUV max = 8.9. 7 mm short axis precarinal node on 59/1 is hypermetabolic with SUV max = 3.4. Hypermetabolism is seen along the medial staple line corresponding to image 60 of series 4. No hypermetabolic left hilar, supraclavicular or axillary lymphadenopathy. Incidental CT findings: Left Port-A-Cath tip is positioned in the distal SVC. Centrilobular and paraseptal emphysema evident. Scarring noted right lung apex and at the right base. Small pericardial effusion. No pleural effusion. ABDOMEN/PELVIS: No abnormal hypermetabolic activity within the liver, pancreas, adrenal glands, or spleen. No hypermetabolic lymph nodes in the abdomen or pelvis. Incidental CT findings: Bilateral renal cysts are similar including probable hemorrhagic/proteinaceous cyst in the lower interpolar left kidney. Scattered parenchymal calcification in the pancreas suggest chronic pancreatitis. There is abdominal aortic atherosclerosis without aneurysm. SKELETON: No focal hypermetabolic activity to suggest skeletal metastasis. Incidental CT findings: Cervical and lumbar fusion hardware evident. IMPRESSION: 1. Interval development of relatively bulky hypermetabolic subcarinal and right hilar lymphadenopathy consistent with metastatic disease. Hypermetabolic lymph nodes are seen in the right paratracheal and precarinal stations as well. 2. Small focus of hypermetabolism along the medial staple line of the left upper lung is suspicious for local recurrence. 3. No unexpected or suspicious hypermetabolic disease in the neck, abdomen, or pelvis. 4.  Aortic Atherosclerois (ICD10-170.0) 5.  Emphysema. ([id number])  Electronically Signed   By: Misty Stanley M.D.   On: 05/27/2020 11:12     ASSESSMENT:  1.  Extensive stage small cell lung cancer: -Right upper lobectomy on 12/30/2018, pathology-4.2 cm small cell lung cancer, invading visceral pleura, 1/3 lymph nodes positive, LVI positive, metastatic carcinoma  in 211 or lymph nodes, right chest wall biopsy consistent with small cell carcinoma. -30 Gy of radiation in 10 fractions from 01/28/2019 through 02/10/2019. -4 cycles of carboplatin and etoposide from 03/09/2019 through 05/11/2019. -PCI completed on 07/31/2019. -We reviewed MRI of the brain on 09/15/2019 with no evidence of brain meta stasis. Concern for hypointense appearance at the C3 vertebral body and left articular process. -CT chest on 09/15/2019 shows numerous new small solid irregular pulmonary nodules scattered throughout both lungs, largest 7 mm on the right lower lobe. Tiny loculated anterior right pleural effusion decreased. Stable mild subcarinal adenopathy. Bilateral stable adrenal adenomas. -MRI of the C-spine on 10/13/2019 showed marrow edema and enhancement involving C3 vertebral body and left articular process without discrete bone lesion. This was thought to be degenerative. -PET scan on 09/28/2019 shows bilateral lung nodules, below PET resolution. Small right-sided nodular densities demonstrating low-level hypermetabolism. Mild hypermetabolic corresponding to a normal-sized subcarinal lymph node. Multifocal right pleural hypermetabolism in the setting of pleural thickening and prior right upper lobectomy, indeterminate. No hypermetabolic extrathoracic disease. -PET scan on 11/24/2019 shows progressive increased soft tissue within the right lung along the suture margins, positive right paratracheal lymph node, small subcapsular focus of increased radiotracer uptake overlying the anterolateral right hepatic lobe concerning for tumor. -PET scan on 05/26/2020 with progression with bulky  hypermetabolic subcarinal and right hilar lymphadenopathy.  Hypermetabolic lymph nodes in the right paratracheal and precarinal stations.   PLAN:  1.  Extensive stage small cell lung cancer: -We reviewed results of the PET scan which showed interval development of metastatic disease in the lymph nodes in the subcarinal and right hilar region and right paratracheal lesion.  There is also small focus of hypermetabolism along the staple line in the left upper lung suspicious for local recurrence. -We discussed options including best supportive care versus active therapy.  She would want to continue therapy.  I think she has decent functional status. -Recommend topotecan days 1 through 5 every 21 days.  Discussed side effects including cytopenias and fatigue. -Tentatively will start next Monday.  2. Essential thrombocytosis: -Hydrea on hold since chemotherapy started.  Platelet count today is 5.4.  3. Macrocytic anemia: -Hemoglobin improved to 12.4 with MCV 91.  4. Weight loss: -Continue dexamethasone 1 mg in the mornings and Remeron 15 mg daily. -Weight improved to 97 pounds.  5. Mid back and sternal pain: -Continue oxycodone 5 mg 1 tablet in the morning, 1 tablet at noon and 2 tablets at bedtime.  Alternate with tramadol.  6. Hypokalemia: -Continue potassium 20 mEq daily.  7.  Brain metastasis: -We reviewed MRI of the brain with and without contrast from 05/24/2020 which showed new subependymal enhancing lesion in the atrium of the right lateral ventricle measuring 2.1 cm concerning for metastatic disease. -Recommend follow-up with Dr. Sondra Come for radiation.   Orders placed this encounter:  No orders of the defined types were placed in this encounter.    Derek Jack, MD [place] [phone]   I, Milinda Antis, am acting as a scribe for Dr. Sanda Linger.  I, Derek Jack MD, have reviewed the above documentation for accuracy and  completeness, and I agree with the above.

## 2020-05-31 NOTE — Patient Instructions (Signed)
[place] at [facility] Discharge Instructions  You were seen today by Dr. Delton Coombes. He went over your recent results and scan; given progression of your cancer and a new mass in your brain, your treatment will have to be switched to topotecan, which is given 5 days in a row every 3 weeks. You will be referred back to radiation, Dr. Sondra Come, to treat your new brain mass. Dr. Delton Coombes will see you back in 1 week for labs and follow up.   Thank you for choosing [place] at [facility] to provide your oncology and hematology care.  To afford each patient quality time with our provider, please arrive at least 15 minutes before your scheduled appointment time.   If you have a lab appointment with the [facility] please come in thru the Main Entrance and check in at the main information desk  You need to re-schedule your appointment should you arrive 10 or more minutes late.  We strive to give you quality time with our providers, and arriving late affects you and other patients whose appointments are after yours.  Also, if you no show three or more times for appointments you may be dismissed from the clinic at the providers discretion.     Again, thank you for choosing [facility].  Our hope is that these requests will decrease the amount of time that you wait before being seen by our physicians.       _____________________________________________________________  Should you have questions after your visit to [facility], please contact our office at [phone] between the hours of 8:00 a.m. and 4:30 p.m.  Voicemails left after 4:00 p.m. will not be returned until the following business day.  For prescription refill requests, have your pharmacy contact our office and allow 72 hours.    Cancer Center Support Programs:   > Cancer Support Group  2nd Tuesday of the month 1pm-2pm, Journey Room

## 2020-05-31 NOTE — Progress Notes (Signed)
No treatment today.  Dr Raliegh Ip is changing treatment.

## 2020-05-31 NOTE — Progress Notes (Signed)
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     A cycle is every 21 days:     Lurbinectedin   **Always confirm dose/schedule in your pharmacy ordering system**  REASON: Disease Progression PRIOR TREATMENT: [id number]: Lurbinectedin 3.2 mg/m2 IV q21 Days Until Progression or Unacceptable Toxicity TREATMENT RESPONSE: Progressive Disease (PD)  START OFF PATHWAY REGIMEN - Small Cell Lung   [id number]:Topotecan 1.5 mg/m2 IV D1-5 q21 Days:   A cycle is every 21 days:     Topotecan   **Always confirm dose/schedule in your pharmacy ordering system**  Patient Characteristics: Relapsed or Progressive Disease, Third Line and Beyond Therapeutic Status: Relapsed or Progressive Disease Line of Therapy: Third Line and Beyond  Intent of Therapy: Non-Curative / Palliative Intent, Discussed with Patient

## 2020-05-31 NOTE — Progress Notes (Signed)
Patient was assessed by Dr. Delton Coombes and labs have been reviewed. No treatment today changing treatment. Primary RN and pharmacy aware.

## 2020-06-01 ENCOUNTER — Other Ambulatory Visit: Payer: Self-pay | Admitting: Radiation Therapy

## 2020-06-01 ENCOUNTER — Ambulatory Visit: Payer: Medicare Other

## 2020-06-01 ENCOUNTER — Ambulatory Visit
Admission: RE | Admit: 2020-06-01 | Discharge: 2020-06-01 | Disposition: A | Discharge status: Home / Self Care | Payer: Medicare Other | Source: Ambulatory Visit | Attending: Radiation Oncology | Admitting: Radiation Oncology

## 2020-06-01 DIAGNOSIS — C7931 Secondary malignant neoplasm of brain: Secondary | ICD-10-CM

## 2020-06-01 DIAGNOSIS — C3491 Malignant neoplasm of unspecified part of right bronchus or lung: Secondary | ICD-10-CM

## 2020-06-01 NOTE — Progress Notes (Incomplete)
Thoracic Location of Tumor / Histology:right small cell lung cancer  Biopsies of 12/30/2018  Surgical Pathology  CASE: [id number]  PATIENT: Ashley [name]  Surgical Pathology Report      Clinical History: RUL pulmonary nodules (cm)      DIAGNOSIS:   A. RIGHT LOWER LOBE LUNG WEDGE RESECTION  - Benign lymph node.   B. RIGHT RIB PLEURA  - Benign fibrous tissue and lung parenchyma and bone.  - There is no evidence of malignancy.   C. LEVEL 7 LYMPH NODE  - There is no evidence of carcinoma in 1 of 1 lymph node (0/1).   D. 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   E. 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   F. RIGHT UPPER LUNG LOBECTOMY  - Invasive small cell carcinoma, 4.2 cm.  - Carcinoma involves the visceral pleura.  - Metastatic carcinoma in 1 of 3 lymph nodes (1/3).  - Lymphovascular invasion is identified.  - The surgical resection margins are negative for carcinoma.  - See oncology table below.   G. LEVEL 12 R LYMPH NODE  - There is no evidence of carcinoma in 1 of 1 lymph node (0/1).   H. RIGHT CHEST WALL BIOPSY:  - Small cell carcinoma.    ONCOLOGY TABLE:   LUNG: Resection   Procedure: Lobectomy  Specimen Laterality: Right  Tumor Site: Right upper lobe  Tumor Size: 4.2 cm  Tumor Focality: Unifocal  Histologic Type: Small cell carcinoma  Visceral Pleura Invasion: Present  Lymphovascular Invasion: Present  Direct Invasion of Adjacent Structures: Involves chest wall (specimen H)  Margins: Uninvolved by tumor  Treatment Effect: No known presurgical therapy  Regional Lymph Nodes:    Number of Lymph Nodes Involved: 3    Number of Lymph Nodes Examined: 8  Pathologic Stage Classification (pTNM, AJCC 8th Edition): at least pT2B,  pN1  Ancillary Studies: Can be performed upon request  Representative Tumor Block: F4-1  Comment(s): None   (v4.1.0.1)    INTRAOPERATIVE DIAGNOSIS:   A. RIGHT LOWER LOBE LUNG NODULE: LYMPHOID TISSUE CONSISTENT WITH  INTRAPULMONARY LYMPH NODE; NO CARCINOMA. PER DR. Melina Copa AT 9:25 AM   B. RIGHT RIB PLEURAL NODULE: CALCIFIED/FIBROTIC NODULE AND SOFT TISSUE  WITH CHRONIC INFLAMMATION; NO CARCINOMA. PER DR. Melina Copa AT 9:27 AM     GROSS DESCRIPTION:   A. Received fresh for intraoperative consult is a 3 g, 4.2 x 1.7 x 1.6  cm wedge of tan-pink lung parenchyma. There is a loosely attached 0.9  cm dark brown nodule on the spinal surface, entirely frozen and  subsequent submitted in block A1.   B. Received fresh and sharp constant is a 0.3 x 0.3 x 0.1 cm aggregate  of tan-white firm soft tissue, submitted for frozen section and  subsequent submitted in block B1.   C. Received fresh and placed in formalin is a 1 x 0.7 x 0.4 cm  aggregate of anthracotic lymph node fragments, submitted entirely in  block C1.   D. Received fresh placed formalin is a 1.3 cm anthracotic lymph node  fragment, bisected and submitted in 1 cassette.   E. Received fresh is a 2.5 x 2 x by 0.5 cm aggregate of anthracotic  lymph node fragments, submitted in block A1.   F. Received fresh for intraoperative consultis a 230 g, 16 x 13 x 4 cm  lung lobectomy,  clinically right upper lobe. Pleural surface is  tan-pink, mottled, hyperemic, intact. There is a 4.2 x 4 x 3.7 cm  ill-defined, lobulated, tan-white firm mass, focally abutting the  pleura. The mass extends 2.5 cm to be bronchial and vascular margins.  Mucosa receives display spongy red-brown parenchyma with prominent focal  severe emphysema. There is a 1.7 cm lobar lymph node with tan-pink soft  cut surfaces. 2 additional possible intraparenchymal lymph nodes are  identified, each 0.3 cm. Representative sections are submitted as  follows:   Block summary:  1 = bronchial margin, frozen section remnant  2 = mass, frozen section remnant  3 = vascular margins   4-6 = mass with pleura  7 = lobar lymph node bisected  8 = representative sections with intraparenchymal lymph nodes   G. Received fresh and placed in formalin is a 2.5 x 2 x 0.5 cm  aggregate of anthracotic lymph node fragments, submitted in block G1.   H. Received fresh and placed in formalin is a 3 x 2 x 0.3 cm aggregate  of tan-pink friable soft tissue, submitted in 1 cassette.   Tobacco/Marijuana/Snuff/ETOH use:   Past/Anticipated interventions by cardiothoracic surgery, if any: 12/30/2018 Dr Melodie Bouillon VIDEO BRONCHOSCOPY N/A General  VIDEO ASSISTED THORACOSCOPY (VATS)/RIGHT LOWER LOBE WEDGE RESECTION, RIGHT UPPER LOBECTOMY Right General  Lymph Node Dissection       Past/Anticipated interventions by medical oncology, if any:  03/09/2019 - 05/15/2019 Chemotherapy   The patient had palonosetron (ALOXI) injection 0.25 mg, 0.25 mg, Intravenous,  Once, 4 of 4 cycles Administration: 0.25 mg (03/09/2019), 0.25 mg (03/30/2019), 0.25 mg (04/20/2019), 0.25 mg (05/11/2019) pegfilgrastim (NEULASTA ONPRO KIT) injection 6 mg, 6 mg, Subcutaneous, Once, 1 of 1 cycle Administration: 6 mg (03/11/2019) pegfilgrastim-jmdb (FULPHILA) injection 6 mg, 6 mg, Subcutaneous,  Once, 3 of 3 cycles Administration: 6 mg (04/03/2019), 6 mg (04/24/2019), 6 mg (05/15/2019) CARBOplatin (PARAPLATIN) 330 mg in sodium chloride 0.9 % 250 mL chemo infusion, 330 mg (100 % of original dose 325.5 mg), Intravenous,  Once, 4 of 4 cycles Dose modification:   (original dose 325.5 mg, Cycle 1),   (original dose 325.5 mg, Cycle 2),   (original dose 309 mg, Cycle 3),   (original dose 325.5 mg, Cycle 4) Administration: 330 mg (03/09/2019), 330 mg (03/30/2019), 310 mg (04/20/2019), 330 mg (05/11/2019) etoposide (VEPESID) 150 mg in sodium chloride 0.9 % 500 mL chemo infusion, 100 mg/m2 = 150 mg, Intravenous,  Once, 4 of 4 cycles Administration: 150 mg (03/09/2019), 150 mg (03/10/2019), 150 mg (03/11/2019), 150 mg (03/30/2019), 150  mg (03/31/2019), 150 mg (04/01/2019), 150 mg (04/20/2019), 150 mg (04/21/2019), 150 mg (04/22/2019), 150 mg (05/11/2019), 150 mg (05/12/2019), 150 mg (05/13/2019) fosaprepitant (EMEND) 150 mg, dexamethasone (DECADRON) 12 mg in sodium chloride 0.9 % 145 mL IVPB, , Intravenous,  Once, 4 of 4 cycles Administration:  (03/09/2019),  (03/30/2019),  (04/20/2019),  (05/11/2019)  for chemotherapy treatment.     Signs/Symptoms  Weight changes, if any:   Respiratory complaints, if any:   Hemoptysis, if any:   Pain issues, if any:    SAFETY ISSUES:  Prior radiation?  Pacemaker/ICD?   Possible current pregnancy?  Is the patient on methotrexate?   Current Complaints / other details:

## 2020-06-01 NOTE — Progress Notes (Incomplete)
Radiation Oncology         [phone] ________________________________  Re-Consultation Note  Name: Ashley [name] MRN: [id number]  Date: 06/01/2020  DOB: 04/07/1951  YI:FOYDXAJ, Laurita Quint, NP  Derek Jack, MD   REFERRING PHYSICIAN: Derek Jack, MD  DIAGNOSIS: There were no encounter diagnoses.  Extensive stage small cell lung cancer  HISTORY OF PRESENT ILLNESS::Ashley [name] is a 70 y.o. 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 She was last seen in follow-up on 08/31/2019, at which time she was recovering from the effects of radiation. She was to continue close follow-up with Dr. Delton Coombes and follow-up with radiation oncology on an as-needed basis.  Since her last visit, she has undergone several imaging studies. Dates and results are as follows: 1. Chest CT scan on 09/16/2019 that showed numerous (greater than 20) new small solid irregular pulmonary nodules scattered throughout both lung, the largest being 7 mm in the right lower lobe, suspicious for new pulmonary metastases. The tiny loculated anterior right pleural effusion had decreased. Postinfectious scarring at the dependent right lung base was also decreasing. Finally, there was mild, non-specific subcarinal adenopathy and stable bilateral adrenal adenomas. 2. MRI of brain on 09/15/2019 that showed concern for interval C3 metastasis, but there was no evidence of brain metastasis. 3. PET scan on 09/28/2019 that showed bilateral pulmonary nodules, primarily below PET resolution. There were small right-sided nodular densities that demonstrated low-level hypermetabolism. Constellation of findings were suspicious for pulmonary metastasis. There was also noted to be mild hypermetabolism corresponding to a normal size subcarinal node that was indeterminate. Additionally, there was multifocal right pleural hypermetabolism in the setting of pleural thickening and  prior right upper lobectomy that was indeterminate. There was no hypermetabolic extrathoracic metastasis identified. Finally, there was noted to be chronic hyperattenuation in the lower pole of the left kidney that was favored to be related to a benign disease (I.e. sequelae of prior infection), aortic atherosclerosis, coronary artery atherosclerosis, emphysema, and a right adrenal adenoma. 4. MRI of cervical spine on 10/13/2019 that showed nearly confluent marrow edema and enhancement involving the C3 vertebral body and left articular process without discrete bone lesion or associated soft tissue mass. Given marrow edema in the adjacent C4 superior endplate and left articular process, and lack of uptake on prior PET-CT, appearance was most likely degenerative in etiology due to progressive now severe adjacent segment degenerative disc disease at C3-C4. 5. Screening unilateral right mammogram on 11/04/2019 that did not show any mammographic evidence of malignancy. 6. PET scan on 11/24/2019 that showed progressive increased soft tissue within the right lung along suture margins, concerning for recurrence of disease. FDG avid low right paratracheal lymph node was concerning for nodal metastasis. Additionally, there was a small subcapsular focus of increased radiotracer uptake overlying the anterolateral right hepatic lobe, also concerning for tumor. Right adrenal gland adenoma was stable. 7. PET scan on 02/12/2020 that showed no evidence of recurrent or metastatic disease. Status post right upper lobectomy. 8. MRI of brain on 05/24/2020 that showed new subependymal enhancing lesion in the atrium of the right lateral ventricle measuring 2.1 cm, concerning for metastatic disease. There was also noted to be subependymal extension of contrast enhancement along the right occipital horn. Marrow signal of the C3 vertebral body had improved and was likely degenerative. Finally, there was found to be bilateral mastoid  effusion, more pronounced on the right. 9. PET scan on  05/26/2020 that showed interval development of relatively bulky hypermetabolic subcarinal and right hilar lymphadenopathy consistent with metastatic disease. Hypermetabolic lymph nodes were seen in the right paratracheal and precarinal stations as well. There was a small focus of hypermetabolism along the medial staple line of the left upper lung that was suspicious for local recurrence. However, there was no unexpected or suspicious hypermetabolic disease in the neck, abdomen, or pelvis.  The patient has been under the closer care of Dr. Delton Coombes and began chemotherapy treatment with Lurbinectedin on 12/15/2019. She was last seen by Dr. Delton Coombes on 05/31/2020, at which time they discussed her most recent PET scan results as well as further options including supportive care vs active therapy. The patient opted to continue therapy. Topotecan will begin tentatively on 06/06/2020. She was referred to radiation oncology regarding the new subependymal enhancing lesion seen on most recent brain MRI.  PAST MEDICAL HISTORY:  Past Medical History:  Diagnosis Date  . 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 Anginal pain ([place])   . Arthritis   . Ascending aortic aneurysm ([place])   . Cancer [facility]) 1995   breast, left, mastectomy/chemo  . Chest pain    Possibly cardiac. No evidence of ischemia/injury based upon normal troponin I. Chest discomfort could be tachycardia induced supply demand mismatch.   . CHF (congestive heart failure) ([place]) 11/17/2015   after surgery   . Colon adenomas   . Coronary artery disease   . DJD (degenerative joint disease)   . Dyspnea    with exertion  . Emphysema of lung ([place]) 11/17/2015  . Essential hypertension, benign   . GERD (gastroesophageal reflux disease)   . History of hiatal hernia   . Hyperlipidemia   . Hypertension   . Melanoma ([place]) 01/09/2016  . Osteopenia   . Palpitations   . Pernicious anemia  03/06/2016  . Pernicious anemia   . Pure hypercholesterolemia   . Raynaud's disease   . Thrombocythemia, essential ([place]) 01/09/2016  . Thrombocytosis    Idiopathic  . Vitamin D deficiency     PAST SURGICAL HISTORY: Past Surgical History:  Procedure Laterality Date  . ABDOMINAL HYSTERECTOMY    . ANTERIOR AND POSTERIOR REPAIR N/A 12/09/2014   Procedure: ANTERIOR (CYSTOCELE) AND POSTERIOR REPAIR (RECTOCELE);  Surgeon: Bjorn Loser, MD;  Location: [facility];  Service: Urology;  Laterality: N/A;  . ANTERIOR CERVICAL DECOMPRESSION/DISCECTOMY FUSION 4 LEVELS Right 10/03/2016   Procedure: ANTERIOR CERVICAL DECOMPRESSION FUSION, CERVICAL 4-5, CERVICAL 5-6, CERVICAL 6-7, CERVICAL 7 TO THORACIC 1 WITH INSTRUMENTATION AND ALLOGRAFT;  Surgeon: Phylliss Bob, MD;  Location: [facility];  Service: Orthopedics;  Laterality: Right;  ANTERIOR CERVICAL DECOMPRESSION FUSION, CERVICAL 4-5, CERVICAL 5-6, CERVICAL 6-7, CERVICAL 7 TO THORACIC 1 WITH INSTRUMENTATION AND ALLOGRAFT; REQUEST 4 HO  . ANTERIOR LAT LUMBAR FUSION Left 11/16/2015   Procedure: LEFT SIDED LATERAL INTERBODY FUSION, LUMBAR 2-3, LUMBAR 3-4, LUMBAR 4-5 WITH INSTRUMENTATION;  Surgeon: Phylliss Bob, MD;  Location: [facility];  Service: Orthopedics;  Laterality: Left;  LEFT SIDED LATEARL INTERBODY FUSION, LUMBAR 2-3, LUMBAR 3-4, LUMBAR 4-5 WITH INSTRUMENTATION   . APPENDECTOMY    . BACK SURGERY    . BONE MARROW ASPIRATION  07/2012  . BONE MARROW BIOPSY  07/2012  . BREAST SURGERY    . CARDIAC CATHETERIZATION    . CARDIAC CATHETERIZATION N/A 01/20/2016   Procedure: Left Heart Cath and Coronary Angiography;  Surgeon: Burnell Blanks, MD;  Location: [facility];  Service: Cardiovascular;  Laterality: N/A;  . COLONOSCOPY  11/29/2010   Procedure: COLONOSCOPY;  Surgeon: Rogene Houston, MD;  [facility];  Service: Endoscopy;  Laterality: N/A;  . COLONOSCOPY N/A 02/18/2014   Procedure: COLONOSCOPY;  Surgeon: Rogene Houston, MD;  [facility];  Service: Endoscopy;  Laterality: N/A;  1030  . COLONOSCOPY N/A 02/25/2017   Procedure: COLONOSCOPY;  Surgeon: Rogene Houston, MD;  [facility];  Service: Endoscopy;  Laterality: N/A;  10:55  . CYSTOSCOPY N/A 12/09/2014   Procedure: CYSTOSCOPY;  Surgeon: Bjorn Loser, MD;  Location: [facility];  Service: Urology;  Laterality: N/A;  . ESOPHAGEAL DILATION N/A 02/25/2017   Procedure: ESOPHAGEAL DILATION;  Surgeon: Rogene Houston, MD;  [facility];  Service: Endoscopy;  Laterality: N/A;  . ESOPHAGOGASTRODUODENOSCOPY N/A 02/25/2017   Procedure: ESOPHAGOGASTRODUODENOSCOPY (EGD);  Surgeon: Rogene Houston, MD;  [facility];  Service: Endoscopy;  Laterality: N/A;  . IR GENERIC HISTORICAL  01/11/2016   IR RADIOLOGY PERIPHERAL GUIDED IV START 01/11/2016 Saverio Danker, PA-C [facility]  . IR GENERIC HISTORICAL  01/11/2016   IR US GUIDE VASC ACCESS RIGHT 01/11/2016 Saverio Danker, PA-C [facility]  . LYMPH NODE DISSECTION Right 12/30/2018   Procedure: Lymph Node Dissection;  Surgeon: Lajuana Matte, MD;  Location: [facility];  Service: Thoracic;  Laterality: Right;  . MASTECTOMY     left  . OVARIAN CYST SURGERY     x2  . POLYPECTOMY  02/25/2017   Procedure: POLYPECTOMY;  Surgeon: Rogene Houston, MD;  [facility];  Service: Endoscopy;;  . PORTACATH PLACEMENT Left 02/16/2019   Procedure: INSERTION PORT-A-CATH (CATHETER  LEFT SUBCLAVIAN);  Surgeon: Aviva Signs, MD;  Location: [facility];  Service: General;  Laterality: Left;  . SALPINGOOPHORECTOMY Bilateral 12/09/2014   Procedure: SALPINGO OOPHORECTOMY;  Surgeon: Servando Salina, MD;  Location: [facility];  Service: Gynecology;  Laterality: Bilateral;  . TUBAL LIGATION    . VAGINAL HYSTERECTOMY N/A 12/09/2014   Procedure: HYSTERECTOMY VAGINAL;  Surgeon: Servando Salina, MD;  Location: [facility];  Service: Gynecology;  Laterality: N/A;  . VIDEO ASSISTED THORACOSCOPY (VATS)/ LOBECTOMY Right  12/30/2018   Procedure: VIDEO ASSISTED THORACOSCOPY (VATS)/RIGHT LOWER LOBE WEDGE RESECTION, RIGHT UPPER LOBECTOMY;  Surgeon: Lajuana Matte, MD;  Location: [facility];  Service: Thoracic;  Laterality: Right;  Marland Kitchen VIDEO BRONCHOSCOPY N/A 12/30/2018   Procedure: VIDEO BRONCHOSCOPY;  Surgeon: Lajuana Matte, MD;  [facility];  Service: Thoracic;  Laterality: N/A;    FAMILY HISTORY:  Family History  Problem Relation Age of Onset  . Hypertension Mother   . Heart failure Mother   . Congestive Heart Failure Mother   . COPD Mother   . Pernicious anemia Mother   . Cancer Mother        lung  . Hypertension Father   . CAD Father   . Heart attack Father   . Hypertension Sister   . Cancer Other   . Celiac disease Other     SOCIAL HISTORY:  Social History   Tobacco Use  . Smoking status: Former Smoker    Packs/day: 0.50    Years: 50.00    Pack years: 25.00    Types: Cigarettes    Quit date: 11/25/2018    Years since quitting: 1.5  . Smokeless tobacco: Never Used  Vaping Use  . Vaping Use: Never used  Substance Use Topics  . Alcohol use: Yes    Comment: occasional  . Drug use: No  ALLERGIES:  Allergies  Allergen Reactions  . Penicillins Hives and Rash    Did it involve swelling of the face/tongue/throat, SOB, or low BP? No Did it involve sudden or severe rash/hives, skin peeling, or any reaction on the inside of your mouth or nose? No Did you need to seek medical attention at a hospital or doctor's office? No When did it last happen?5-10 year If all above answers are "NO", may proceed with cephalosporin use.    . Tape Rash    Medipore, Coban, and paper tape CAN be tolerated, Patient states that this can be removed     MEDICATIONS:  Current Outpatient Medications  Medication Sig Dispense Refill  . albuterol (PROVENTIL HFA;VENTOLIN HFA) 108 (90 Base) MCG/ACT inhaler Inhale 2 puffs into the lungs every 6 (six) hours as needed for wheezing or shortness of  breath. 1 Inhaler 2  . albuterol (PROVENTIL) (2.5 MG/3ML) 0.083% nebulizer solution Take 2.5 mg by nebulization every 6 (six) hours.    Marland Kitchen amLODipine (NORVASC) 2.5 MG tablet 1 tablet    . aspirin EC 81 MG tablet Take 81 mg by mouth daily.    . Calcium Carb-Cholecalciferol 600-800 MG-UNIT TABS Take 1 tablet by mouth 2 (two) times daily.    . cefdinir (OMNICEF) 300 MG capsule Take 300 mg by mouth 2 (two) times daily.    . Cholecalciferol (VITAMIN D-3) 1000 units CAPS Take 1,000 Units daily by mouth.     . clonazePAM (KLONOPIN) 0.5 MG tablet TAKE ONE TABLET BY MOUTH AT BEDTIME. 90 tablet 4  . cyanocobalamin (,VITAMIN B-12,) 1000 MCG/ML injection INJECT 1 ML INTO THE MUSCLE ONCE MONTHLY AS DIRECTED. (Patient taking differently: Inject 1,000 mcg into the muscle every 30 (thirty) days.) 1 mL 5  . dexamethasone (DECADRON) 2 MG tablet Take 1 tablet (2 mg total) by mouth daily. 30 tablet 3  . diclofenac Sodium (VOLTAREN) 1 % GEL Apply 2 grams to hands    . ergocalciferol (VITAMIN D2) 1.25 MG (50000 UT) capsule Take by mouth.    . fluconazole (DIFLUCAN) 150 MG tablet     . furosemide (LASIX) 40 MG tablet 1/2 in Am and 1 tablet in PM    . gabapentin (NEURONTIN) 600 MG tablet Take 1 tablet (600 mg total) by mouth daily. 30 tablet 6  . guaiFENesin (MUCINEX) 600 MG 12 hr tablet Take 1 tablet (600 mg total) by mouth 2 (two) times daily as needed for cough or to loosen phlegm.    . hydroxyurea (HYDREA) 500 MG capsule capsule    . ibandronate (BONIVA) 150 MG tablet Take 150 mg by mouth every 30 (thirty) days.     Marland Kitchen ibuprofen (ADVIL) 800 MG tablet Take 1 tablet (800 mg total) by mouth every 8 (eight) hours as needed for moderate pain. 30 tablet 6  . ipratropium (ATROVENT) 0.02 % nebulizer solution Take by nebulization.    Marland Kitchen ipratropium-albuterol (DUONEB) 0.5-2.5 (3) MG/3ML SOLN Take 3 mLs by nebulization every 6 (six) hours as needed (shortness of breath). 360 mL 0  . lidocaine-prilocaine (EMLA) cream Apply a  small amount to port a cath site and cover with plastic wrap 1 hour prior to chemotherapy appointments 30 g 3  . magic mouthwash w/lidocaine SOLN 10 cc swab and swallow    . Meclizine HCl 25 MG CHEW 1 tablet    . meloxicam (MOBIC) 15 MG tablet 1 tablet    . methocarbamol (ROBAXIN) 500 MG tablet Take 500 mg by mouth every 6 (six) hours  as needed for muscle spasms.     . metoCLOPramide (REGLAN) 5 MG tablet 1 tablet before meals    . mirtazapine (REMERON) 15 MG tablet Take 1 tablet (15 mg total) by mouth at bedtime. 30 tablet 0  . nitroGLYCERIN (NITRODUR - DOSED IN MG/24 HR) 0.1 mg/hr patch Place 0.1 mg onto the skin daily.    . ondansetron (ZOFRAN ODT) 8 MG disintegrating tablet Take 1 tablet (8 mg total) by mouth every 8 (eight) hours as needed for nausea or vomiting. 60 tablet 2  . oxyCODONE (OXY IR/ROXICODONE) 5 MG immediate release tablet Take 1 tablet (5 mg total) by mouth every 8 (eight) hours. 90 tablet 0  . OXYGEN Inhale 3 L into the lungs daily.    . pantoprazole (PROTONIX) 40 MG tablet TAKE ONE TABLET BY MOUTH TWICE DAILY. 60 tablet 0  . polyethylene glycol (MIRALAX / GLYCOLAX) 17 g packet Take 17 g by mouth daily as needed for mild constipation.    . potassium chloride SA (KLOR-CON) 20 MEQ tablet Take 1 tablet (20 mEq total) by mouth 3 (three) times daily. (Patient taking differently: Take 20 mEq by mouth daily.) 60 tablet 2  . prochlorperazine (COMPAZINE) 10 MG tablet 1 tablet as needed    . rizatriptan (MAXALT) 10 MG tablet Take 10 mg by mouth 2 (two) times daily. 1-2 times daily May repeat in 2 hours if needed    . rosuvastatin (CRESTOR) 40 MG tablet Take 40 mg by mouth at bedtime.    . sucralfate (CARAFATE) 1 g tablet Take 1 g by mouth 3 (three) times daily.    . traMADol (ULTRAM) 50 MG tablet Take 50 mg by mouth every 6 (six) hours as needed.    . triamcinolone cream (KENALOG) 0.1 %      No current facility-administered medications for this encounter.   Facility-Administered  Medications Ordered in Other Encounters  Medication Dose Route Frequency Provider Last Rate Last Admin  . sodium chloride flush (NS) 0.9 % injection 10 mL  10 mL Intravenous PRN Lockamy, Randi L, NP-C   10 mL at 12/22/19 1240    REVIEW OF SYSTEMS:  A 10+ POINT REVIEW OF SYSTEMS WAS OBTAINED including neurology, dermatology, psychiatry, cardiac, respiratory, lymph, extremities, GI, GU, musculoskeletal, constitutional, reproductive, HEENT. ***   PHYSICAL EXAM:  vitals were not taken for this visit.   General: Alert and oriented, in no acute distress HEENT: Head is normocephalic. Extraocular movements are intact. Oropharynx is clear. Neck: Neck is supple, no palpable cervical or supraclavicular lymphadenopathy. Heart: Regular in rate and rhythm with no murmurs, rubs, or gallops. Chest: Clear to auscultation bilaterally, with no rhonchi, wheezes, or rales. Abdomen: Soft, nontender, nondistended, with no rigidity or guarding. Extremities: No cyanosis or edema. Lymphatics: see Neck Exam Skin: No concerning lesions. Musculoskeletal: symmetric strength and muscle tone throughout. Neurologic: Cranial nerves II through XII are grossly intact. No obvious focalities. Speech is fluent. Coordination is intact. Psychiatric: Judgment and insight are intact. Affect is appropriate. ***  ECOG = ***  0 - Asymptomatic (Fully active, able to carry on all predisease activities without restriction)  1 - Symptomatic but completely ambulatory (Restricted in physically strenuous activity but ambulatory and able to carry out work of a light or sedentary nature. For example, light housework, office work)  2 - Symptomatic, <50% in bed during the day (Ambulatory and capable of all self care but unable to carry out any work activities. Up and about more than 50% of waking hours)  3 - Symptomatic, >50% in bed, but not bedbound (Capable of only limited self-care, confined to bed or chair 50% or more of waking  hours)  4 - Bedbound (Completely disabled. Cannot carry on any self-care. Totally confined to bed or chair)  5 - Death   Eustace Pen MM, Ashley [name], Tormey DC, et al. [phone]). "Toxicity and response criteria of the [facility]". [place] Oncol. 5 (6): 649-55  LABORATORY DATA:  Lab Results  Component Value Date   WBC 10.4 05/31/2020   HGB 12.4 05/31/2020   HCT 39.8 05/31/2020   MCV 91.3 05/31/2020   PLT 554 (H) 05/31/2020   NEUTROABS 8.4 (H) 05/31/2020   Lab Results  Component Value Date   NA 137 05/31/2020   K 3.3 (L) 05/31/2020   CL 104 05/31/2020   CO2 26 05/31/2020   GLUCOSE 170 (H) 05/31/2020   CREATININE 0.89 05/31/2020   CALCIUM 9.6 05/31/2020      RADIOGRAPHY: MR Brain W Wo Contrast  Result Date: 05/24/2020 CLINICAL DATA:  Small cell lung cancer, assess treatment response. EXAM: MRI HEAD WITHOUT AND WITH CONTRAST TECHNIQUE: Multiplanar, multiecho pulse sequences of the brain and surrounding structures were obtained without and with intravenous contrast. CONTRAST:  61m MULTIHANCE GADOBENATE DIMEGLUMINE 529 MG/ML IV SOLN Contrast administration was performed via previously implanted port catheter, assessed by a registered nurse. COMPARISON:  MRI of the brain September 15, 2019 FINDINGS: Brain: No acute infarction, hemorrhage, hydrocephalus or extra-axial collection. New subependymal enhancing lesion in the atrium of the right lateral ventricle measuring approximately 2.1 x 2.0 x 0.8 cm. Subependymal extension of contrast enhancement along the right occipital horn. Surrounding vasogenic edema is seen in the adjacent white matter. No other enhancing lesion identified. Vascular: Normal flow voids. Skull and upper cervical spine: Improved marrow signal of the C3 vertebral body. Sinuses/Orbits: Negative. Other: Bilateral mastoid effusion, more pronounced on the right IMPRESSION: 1. New subependymal enhancing lesion in the atrium of the right lateral ventricle measuring 2.1  cm, concerning for metastatic disease. Subependymal extension of contrast enhancement along the right occipital horn. 2. Improved marrow signal of the C3 vertebral body, likely degenerative. 3. Bilateral mastoid effusion, more pronounced on the right. Electronically Signed   By: KPedro EarlsM.D.   On: 05/24/2020 15:18   NM PET Image Restag (PS) Skull Base To Thigh  Result Date: 05/27/2020 CLINICAL DATA:  Subsequent treatment strategy for small-cell lung cancer. EXAM: NUCLEAR MEDICINE PET SKULL BASE TO THIGH TECHNIQUE: 5.0 mCi F-18 FDG was injected intravenously. Full-ring PET imaging was performed from the skull base to thigh after the radiotracer. CT data was obtained and used for attenuation correction and anatomic localization. Fasting blood glucose: 89 mg/dl COMPARISON:  02/12/2020. FINDINGS: Mediastinal blood pool activity: SUV max 1.9 Liver activity: SUV max NA NECK: No hypermetabolic lymph nodes in the neck. Incidental CT findings: none CHEST: Volume loss right hemithorax compatible with history of prior right upper lobectomy. Interval development of a new 2.7 cm short axis nodal lesion in the subcarinal station with SUV max = 8.7. New hypermetabolic lymphadenopathy in the right hilum is not well demonstrated on noncontrast CT imaging with SUV max = 8.9. 7 mm short axis precarinal node on 645/6is hypermetabolic with SUV max = 3.4. Hypermetabolism is seen along the medial staple line corresponding to image 60 of series 4. No hypermetabolic left hilar, supraclavicular or axillary lymphadenopathy. Incidental CT findings: Left Port-A-Cath tip is positioned in the distal SVC. Centrilobular  and paraseptal emphysema evident. Scarring noted right lung apex and at the right base. Small pericardial effusion. No pleural effusion. ABDOMEN/PELVIS: No abnormal hypermetabolic activity within the liver, pancreas, adrenal glands, or spleen. No hypermetabolic lymph nodes in the abdomen or pelvis.  Incidental CT findings: Bilateral renal cysts are similar including probable hemorrhagic/proteinaceous cyst in the lower interpolar left kidney. Scattered parenchymal calcification in the pancreas suggest chronic pancreatitis. There is abdominal aortic atherosclerosis without aneurysm. SKELETON: No focal hypermetabolic activity to suggest skeletal metastasis. Incidental CT findings: Cervical and lumbar fusion hardware evident. IMPRESSION: 1. Interval development of relatively bulky hypermetabolic subcarinal and right hilar lymphadenopathy consistent with metastatic disease. Hypermetabolic lymph nodes are seen in the right paratracheal and precarinal stations as well. 2. Small focus of hypermetabolism along the medial staple line of the left upper lung is suspicious for local recurrence. 3. No unexpected or suspicious hypermetabolic disease in the neck, abdomen, or pelvis. 4.  Aortic Atherosclerois (ICD10-170.0) 5.  Emphysema. ([id number]) Electronically Signed   By: Misty Stanley M.D.   On: 05/27/2020 11:12      IMPRESSION: Extensive stage small cell lung cancer  ***  Today, I talked to the patient and family about the findings and work-up thus far.  We discussed the natural history of metastatic lung cancer and general treatment, highlighting the role of radiotherapy in the management.  We discussed the available radiation techniques, and focused on the details of logistics and delivery.  We reviewed the anticipated acute and late sequelae associated with radiation in this setting.  The patient was encouraged to ask questions that I answered to the best of my ability. *** A patient consent form was discussed and signed.  We retained a copy for our records.  The patient would like to proceed with radiation and will be scheduled for CT simulation.  PLAN: ***   Total time spent in this encounter was *** minutes which included reviewing the patient's most recent PET scans, chest CT scan, brain MRIs,  follow-ups with Dr. Delton Coombes, chemotherapy, physical examination, and documentation.  ------------------------------------------------  Blair Promise, PhD, MD  This document serves as a record of services personally performed by Gery Pray, MD. It was created on his behalf by Clerance Lav, a trained medical scribe. The creation of this record is based on the scribe's personal observations and the provider's statements to them. This document has been checked and approved by the attending provider.

## 2020-06-02 NOTE — Progress Notes (Signed)
[address] [place], [place] [address]   CLINIC:  Medical Oncology/Hematology  PCP:  [name], NP [address] / [place] [address] [phone]   REASON FOR VISIT:  Follow-up for right small cell lung cancer  PRIOR THERAPY:  1. Right upper lobectomy on 12/30/2018. 2. Radiation to whole brain 25 Gy in 10 fractions from 01/28/2019 through 02/10/2019. 3. Carboplatin and etoposide x 4 cycles from 03/09/2019 through 05/11/2019.  NGS Results: Not done  CURRENT THERAPY: Lurbinectedin & Aloxi every 3 weeks  BRIEF ONCOLOGIC HISTORY:  Oncology History  Adenocarcinoma of left breast ([facility])  09/29/1993 - 05/14/1994 Chemotherapy    AC Q 3 weeks X 6 cycles   01/02/1994 Surgery   Left modified radical mastectomy   01/02/1994 Pathology Results   ER-, PR - with a single positive LN found in the L axillae, Stage II disease   07/22/2015 Imaging   Bone Scan, No metastatic pattern uptake, degenerative changes in the lumbar spine with dextroscoliosis   05/25/2016 PET scan   No findings of active malignancy in the neck, chest, abdomen, or pelvis. The 3 liver lesions are not hypermetabolic. The radiologist suspects they may be subtly present on prior CT chest from 10/2013, to further reassuring that these are likely benign lesions.   Melanoma ([facility])  07/09/2013 Initial Biopsy   Initial biopsy L upper thigh/buttocks melanoma   07/20/2013 Surgery   Excision L lateral buttock/upper thigh melanoma, clear margins   07/20/2013 Pathology Results   Breslow depth 1.02 mm, pT2a    Small cell lung carcinoma, right ([place])  11/27/2018 Initial Diagnosis   Small cell lung carcinoma, right ([place])   03/09/2019 - 05/15/2019 Chemotherapy   The patient had palonosetron (ALOXI) injection 0.25 mg, 0.25 mg, Intravenous,  Once, 4 of 4 cycles Administration: 0.25 mg (03/09/2019), 0.25 mg (03/30/2019), 0.25 mg (04/20/2019), 0.25 mg (05/11/2019) pegfilgrastim (NEULASTA ONPRO KIT)  injection 6 mg, 6 mg, Subcutaneous, Once, 1 of 1 cycle Administration: 6 mg (03/11/2019) pegfilgrastim-jmdb (FULPHILA) injection 6 mg, 6 mg, Subcutaneous,  Once, 3 of 3 cycles Administration: 6 mg (04/03/2019), 6 mg (04/24/2019), 6 mg (05/15/2019) CARBOplatin (PARAPLATIN) 330 mg in sodium chloride 0.9 % 250 mL chemo infusion, 330 mg (100 % of original dose 325.5 mg), Intravenous,  Once, 4 of 4 cycles Dose modification:   (original dose 325.5 mg, Cycle 1),   (original dose 325.5 mg, Cycle 2),   (original dose 309 mg, Cycle 3),   (original dose 325.5 mg, Cycle 4) Administration: 330 mg (03/09/2019), 330 mg (03/30/2019), 310 mg (04/20/2019), 330 mg (05/11/2019) etoposide (VEPESID) 150 mg in sodium chloride 0.9 % 500 mL chemo infusion, 100 mg/m2 = 150 mg, Intravenous,  Once, 4 of 4 cycles Administration: 150 mg (03/09/2019), 150 mg (03/10/2019), 150 mg (03/11/2019), 150 mg (03/30/2019), 150 mg (03/31/2019), 150 mg (04/01/2019), 150 mg (04/20/2019), 150 mg (04/21/2019), 150 mg (04/22/2019), 150 mg (05/11/2019), 150 mg (05/12/2019), 150 mg (05/13/2019) fosaprepitant (EMEND) 150 mg, dexamethasone (DECADRON) 12 mg in sodium chloride 0.9 % 145 mL IVPB, , Intravenous,  Once, 4 of 4 cycles Administration:  (03/09/2019),  (03/30/2019),  (04/20/2019),  (05/11/2019)  for chemotherapy treatment.    03/09/2019 Cancer Staging   Staging form: Lung, AJCC 8th Edition - Clinical: Stage IIIA (cT3, cN1, cM0) - Signed by Derek Jack, MD on 03/09/2019   12/15/2019 - 05/05/2020 Chemotherapy         06/06/2020 -  Chemotherapy    Patient is on Treatment Plan:  LUNG SMALL CELL TOPOTECAN D1-5 Q21D        CANCER STAGING: Cancer Staging Small cell lung carcinoma, right (HCC) Staging form: Lung, AJCC 8th Edition - Clinical: Stage IIIA (cT3, cN1, cM0) - Signed by Derek Jack, MD on 03/09/2019   INTERVAL HISTORY:  [name], a 70 y.o. female, seen for follow-up of small cell lung cancer.  She was evaluated by  Dr. Isidore Moos and [facility] was recommended.  She complains of nausea more prominent in the last 3 to 4 days but denied any vomiting.  She reports that Zofran helps.  She also has more right-sided headaches.  She is taking dexamethasone 1 mg in the mornings.   REVIEW OF SYSTEMS:  Review of Systems  Constitutional: Positive for fatigue.  Respiratory: Positive for shortness of breath.   Gastrointestinal: Positive for constipation and nausea.  Neurological: Positive for headaches and numbness.  All other systems reviewed and are negative.   PAST MEDICAL/SURGICAL HISTORY:  Past Medical History:  Diagnosis Date  . 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 Anginal pain ([facility])   . Arthritis   . Ascending aortic aneurysm ([place])   . Cancer [facility]) 1995   breast, left, mastectomy/chemo  . Chest pain    Possibly cardiac. No evidence of ischemia/injury based upon normal troponin I. Chest discomfort could be tachycardia induced supply demand mismatch.   . CHF (congestive heart failure) ([place]) 11/17/2015   after surgery   . Colon adenomas   . Coronary artery disease   . DJD (degenerative joint disease)   . Dyspnea    with exertion  . Emphysema of lung ([facility]) 11/17/2015  . Essential hypertension, benign   . GERD (gastroesophageal reflux disease)   . History of hiatal hernia   . Hyperlipidemia   . Hypertension   . Melanoma ([facility]) 01/09/2016  . Osteopenia   . Palpitations   . Pernicious anemia 03/06/2016  . Pernicious anemia   . Pure hypercholesterolemia   . Raynaud's disease   . Thrombocythemia, essential ([place]) 01/09/2016  . Thrombocytosis    Idiopathic  . Vitamin D deficiency    Past Surgical History:  Procedure Laterality Date  . ABDOMINAL HYSTERECTOMY    . ANTERIOR AND POSTERIOR REPAIR N/A 12/09/2014   Procedure: ANTERIOR (CYSTOCELE) AND POSTERIOR REPAIR (RECTOCELE);  Surgeon: Bjorn Loser, MD;  Location: [facility];  Service: Urology;  Laterality: N/A;  . ANTERIOR CERVICAL  DECOMPRESSION/DISCECTOMY FUSION 4 LEVELS Right 10/03/2016   Procedure: ANTERIOR CERVICAL DECOMPRESSION FUSION, CERVICAL 4-5, CERVICAL 5-6, CERVICAL 6-7, CERVICAL 7 TO THORACIC 1 WITH INSTRUMENTATION AND ALLOGRAFT;  Surgeon: Phylliss Bob, MD;  Location: [facility];  Service: Orthopedics;  Laterality: Right;  ANTERIOR CERVICAL DECOMPRESSION FUSION, CERVICAL 4-5, CERVICAL 5-6, CERVICAL 6-7, CERVICAL 7 TO THORACIC 1 WITH INSTRUMENTATION AND ALLOGRAFT; REQUEST 4 HO  . ANTERIOR LAT LUMBAR FUSION Left 11/16/2015   Procedure: LEFT SIDED LATERAL INTERBODY FUSION, LUMBAR 2-3, LUMBAR 3-4, LUMBAR 4-5 WITH INSTRUMENTATION;  Surgeon: Phylliss Bob, MD;  Location: [facility];  Service: Orthopedics;  Laterality: Left;  LEFT SIDED LATEARL INTERBODY FUSION, LUMBAR 2-3, LUMBAR 3-4, LUMBAR 4-5 WITH INSTRUMENTATION   . APPENDECTOMY    . BACK SURGERY    . BONE MARROW ASPIRATION  07/2012  . BONE MARROW BIOPSY  07/2012  . BREAST SURGERY    . CARDIAC CATHETERIZATION    . CARDIAC CATHETERIZATION N/A 01/20/2016   Procedure: Left Heart Cath and Coronary Angiography;  Surgeon: Burnell Blanks, MD;  Location: [facility];  Service: Cardiovascular;  Laterality: N/A;  . COLONOSCOPY  11/29/2010   Procedure: COLONOSCOPY;  Surgeon: Rogene Houston, MD;  [facility];  Service: Endoscopy;  Laterality: N/A;  . COLONOSCOPY N/A 02/18/2014   Procedure: COLONOSCOPY;  Surgeon: Rogene Houston, MD;  [facility];  Service: Endoscopy;  Laterality: N/A;  1030  . COLONOSCOPY N/A 02/25/2017   Procedure: COLONOSCOPY;  Surgeon: Rogene Houston, MD;  [facility];  Service: Endoscopy;  Laterality: N/A;  10:55  . CYSTOSCOPY N/A 12/09/2014   Procedure: CYSTOSCOPY;  Surgeon: Bjorn Loser, MD;  Location: [facility];  Service: Urology;  Laterality: N/A;  . ESOPHAGEAL DILATION N/A 02/25/2017   Procedure: ESOPHAGEAL DILATION;  Surgeon: Rogene Houston, MD;  [facility];  Service: Endoscopy;  Laterality: N/A;   . ESOPHAGOGASTRODUODENOSCOPY N/A 02/25/2017   Procedure: ESOPHAGOGASTRODUODENOSCOPY (EGD);  Surgeon: Rogene Houston, MD;  [facility];  Service: Endoscopy;  Laterality: N/A;  . IR GENERIC HISTORICAL  01/11/2016   IR RADIOLOGY PERIPHERAL GUIDED IV START 01/11/2016 Saverio Danker, PA-C [facility]  . IR GENERIC HISTORICAL  01/11/2016   IR US GUIDE VASC ACCESS RIGHT 01/11/2016 Saverio Danker, PA-C [facility]  . LYMPH NODE DISSECTION Right 12/30/2018   Procedure: Lymph Node Dissection;  Surgeon: Lajuana Matte, MD;  Location: [facility];  Service: Thoracic;  Laterality: Right;  . MASTECTOMY     left  . OVARIAN CYST SURGERY     x2  . POLYPECTOMY  02/25/2017   Procedure: POLYPECTOMY;  Surgeon: Rogene Houston, MD;  [facility];  Service: Endoscopy;;  . PORTACATH PLACEMENT Left 02/16/2019   Procedure: INSERTION PORT-A-CATH (CATHETER  LEFT SUBCLAVIAN);  Surgeon: Aviva Signs, MD;  Location: [facility];  Service: General;  Laterality: Left;  . SALPINGOOPHORECTOMY Bilateral 12/09/2014   Procedure: SALPINGO OOPHORECTOMY;  Surgeon: Servando Salina, MD;  Location: [facility];  Service: Gynecology;  Laterality: Bilateral;  . TUBAL LIGATION    . VAGINAL HYSTERECTOMY N/A 12/09/2014   Procedure: HYSTERECTOMY VAGINAL;  Surgeon: Servando Salina, MD;  Location: [facility];  Service: Gynecology;  Laterality: N/A;  . VIDEO ASSISTED THORACOSCOPY (VATS)/ LOBECTOMY Right 12/30/2018   Procedure: VIDEO ASSISTED THORACOSCOPY (VATS)/RIGHT LOWER LOBE WEDGE RESECTION, RIGHT UPPER LOBECTOMY;  Surgeon: Lajuana Matte, MD;  Location: [facility];  Service: Thoracic;  Laterality: Right;  Marland Kitchen VIDEO BRONCHOSCOPY N/A 12/30/2018   Procedure: VIDEO BRONCHOSCOPY;  Surgeon: Lajuana Matte, MD;  Location[facility];  Service: Thoracic;  Laterality: N/A;    SOCIAL HISTORY:  Social History   Socioeconomic History  . Marital status: Divorced    Spouse name: Not on file  . Number of children: 2  . Years of  education: Not on file  . Highest education level: Not on file  Occupational History  . Not on file  Tobacco Use  . Smoking status: Former Smoker    Packs/day: 0.50    Years: 50.00    Pack years: 25.00    Types: Cigarettes    Quit date: 11/25/2018    Years since quitting: 1.5  . Smokeless tobacco: Never Used  Vaping Use  . Vaping Use: Never used  Substance and Sexual Activity  . Alcohol use: Yes    Comment: occasional  . Drug use: No  . Sexual activity: Not Currently    Birth control/protection: Post-menopausal  Other Topics Concern  . Not on file  Social History Narrative  . Not on file   Social Determinants  of Health   Financial Resource Strain: Low Risk   . Difficulty of Paying Living Expenses: Not hard at all  Food Insecurity: No Food Insecurity  . Worried About Charity fundraiser in the Last Year: Never true  . Ran Out of Food in the Last Year: Never true  Transportation Needs: No Transportation Needs  . Lack of Transportation (Medical): No  . Lack of Transportation (Non-Medical): No  Physical Activity: Insufficiently Active  . Days of Exercise per Week: 4 days  . Minutes of Exercise per Session: 30 min  Stress: No Stress Concern Present  . Feeling of Stress : Not at all  Social Connections: Moderately Isolated  . Frequency of Communication with Friends and Family: More than three times a week  . Frequency of Social Gatherings with Friends and Family: More than three times a week  . Attends Religious Services: More than 4 times per year  . Active Member of Clubs or Organizations: No  . Attends Archivist Meetings: Never  . Marital Status: Divorced  Human resources officer Violence: Not At Risk  . Fear of Current or Ex-Partner: No  . Emotionally Abused: No  . Physically Abused: No  . Sexually Abused: No    FAMILY HISTORY:  Family History  Problem Relation Age of Onset  . Hypertension Mother   . Heart failure Mother   . Congestive Heart Failure Mother    . COPD Mother   . Pernicious anemia Mother   . Cancer Mother        lung  . Hypertension Father   . CAD Father   . Heart attack Father   . Hypertension Sister   . Cancer Other   . Celiac disease Other     CURRENT MEDICATIONS:  Current Outpatient Medications  Medication Sig Dispense Refill  . albuterol (PROVENTIL HFA;VENTOLIN HFA) 108 (90 Base) MCG/ACT inhaler Inhale 2 puffs into the lungs every 6 (six) hours as needed for wheezing or shortness of breath. 1 Inhaler 2  . albuterol (PROVENTIL) (2.5 MG/3ML) 0.083% nebulizer solution Take 2.5 mg by nebulization every 6 (six) hours.    Marland Kitchen amLODipine (NORVASC) 2.5 MG tablet 1 tablet    . aspirin EC 81 MG tablet Take 81 mg by mouth daily.    . Calcium Carb-Cholecalciferol 600-800 MG-UNIT TABS Take 1 tablet by mouth 2 (two) times daily.    . cefdinir (OMNICEF) 300 MG capsule Take 300 mg by mouth 2 (two) times daily.    . Cholecalciferol (VITAMIN D-3) 1000 units CAPS Take 1,000 Units daily by mouth.     . clonazePAM (KLONOPIN) 0.5 MG tablet TAKE ONE TABLET BY MOUTH AT BEDTIME. 90 tablet 4  . cyanocobalamin (,VITAMIN B-12,) 1000 MCG/ML injection INJECT 1 ML INTO THE MUSCLE ONCE MONTHLY AS DIRECTED. (Patient taking differently: Inject 1,000 mcg into the muscle every 30 (thirty) days.) 1 mL 5  . dexamethasone (DECADRON) 2 MG tablet Take 1 tablet (2 mg total) by mouth daily. 30 tablet 3  . diclofenac Sodium (VOLTAREN) 1 % GEL Apply 2 grams to hands    . ergocalciferol (VITAMIN D2) 1.25 MG (50000 UT) capsule Take by mouth.    . fluconazole (DIFLUCAN) 150 MG tablet     . furosemide (LASIX) 40 MG tablet 1/2 in Am and 1 tablet in PM    . gabapentin (NEURONTIN) 600 MG tablet Take 1 tablet (600 mg total) by mouth daily. 30 tablet 6  . guaiFENesin (MUCINEX) 600 MG 12 hr tablet Take 1  tablet (600 mg total) by mouth 2 (two) times daily as needed for cough or to loosen phlegm.    . hydroxyurea (HYDREA) 500 MG capsule capsule    . ibandronate (BONIVA) 150  MG tablet Take 150 mg by mouth every 30 (thirty) days.     Marland Kitchen ibuprofen (ADVIL) 800 MG tablet Take 1 tablet (800 mg total) by mouth every 8 (eight) hours as needed for moderate pain. 30 tablet 6  . ipratropium (ATROVENT) 0.02 % nebulizer solution Take by nebulization.    Marland Kitchen ipratropium-albuterol (DUONEB) 0.5-2.5 (3) MG/3ML SOLN Take 3 mLs by nebulization every 6 (six) hours as needed (shortness of breath). 360 mL 0  . lidocaine-prilocaine (EMLA) cream Apply a small amount to port a cath site and cover with plastic wrap 1 hour prior to chemotherapy appointments 30 g 3  . magic mouthwash w/lidocaine SOLN 10 cc swab and swallow    . Meclizine HCl 25 MG CHEW 1 tablet    . meloxicam (MOBIC) 15 MG tablet 1 tablet    . methocarbamol (ROBAXIN) 500 MG tablet Take 500 mg by mouth every 6 (six) hours as needed for muscle spasms.     . metoCLOPramide (REGLAN) 5 MG tablet 1 tablet before meals    . mirtazapine (REMERON) 15 MG tablet Take 1 tablet (15 mg total) by mouth at bedtime. 30 tablet 0  . nitroGLYCERIN (NITRODUR - DOSED IN MG/24 HR) 0.1 mg/hr patch Place 0.1 mg onto the skin daily.    . ondansetron (ZOFRAN ODT) 8 MG disintegrating tablet Take 1 tablet (8 mg total) by mouth every 8 (eight) hours as needed for nausea or vomiting. 60 tablet 2  . oxyCODONE (OXY IR/ROXICODONE) 5 MG immediate release tablet Take 1 tablet (5 mg total) by mouth every 8 (eight) hours. 90 tablet 0  . OXYGEN Inhale 3 L into the lungs daily.    . pantoprazole (PROTONIX) 40 MG tablet TAKE ONE TABLET BY MOUTH TWICE DAILY. 60 tablet 0  . polyethylene glycol (MIRALAX / GLYCOLAX) 17 g packet Take 17 g by mouth daily as needed for mild constipation.    . potassium chloride SA (KLOR-CON) 20 MEQ tablet Take 1 tablet (20 mEq total) by mouth 3 (three) times daily. (Patient taking differently: Take 20 mEq by mouth daily.) 60 tablet 2  . prochlorperazine (COMPAZINE) 10 MG tablet 1 tablet as needed    . rizatriptan (MAXALT) 10 MG tablet Take 10  mg by mouth 2 (two) times daily. 1-2 times daily May repeat in 2 hours if needed    . rosuvastatin (CRESTOR) 40 MG tablet Take 40 mg by mouth at bedtime.    . sucralfate (CARAFATE) 1 g tablet Take 1 g by mouth 3 (three) times daily.    . traMADol (ULTRAM) 50 MG tablet Take 50 mg by mouth every 6 (six) hours as needed.    . triamcinolone cream (KENALOG) 0.1 %      No current facility-administered medications for this visit.   Facility-Administered Medications Ordered in Other Visits  Medication Dose Route Frequency Provider Last Rate Last Admin  . sodium chloride flush (NS) 0.9 % injection 10 mL  10 mL Intravenous PRN Lockamy, Randi L, NP-C   10 mL at 12/22/19 1240    ALLERGIES:  Allergies  Allergen Reactions  . Penicillins Hives and Rash    Did it involve swelling of the face/tongue/throat, SOB, or low BP? No Did it involve sudden or severe rash/hives, skin peeling, or any reaction on the  inside of your mouth or nose? No Did you need to seek medical attention at a hospital or doctor's office? No When did it last happen?5-10 year If all above answers are "NO", may proceed with cephalosporin use.    . Tape Rash    Medipore, Coban, and paper tape CAN be tolerated, Patient states that this can be removed     PHYSICAL EXAM:  Performance status (ECOG): 1 - Symptomatic but completely ambulatory  There were no vitals filed for this visit. Wt Readings from Last 3 Encounters:  05/31/20 97 lb 8 oz (44.2 kg)  05/05/20 95 lb 1.6 oz (43.1 kg)  04/12/20 91 lb 3.2 oz (41.4 kg)   Physical Exam Vitals reviewed.  Constitutional:      Appearance: Normal appearance.  Cardiovascular:     Rate and Rhythm: Normal rate and regular rhythm.     Heart sounds: Normal heart sounds.  Pulmonary:     Effort: Pulmonary effort is normal.     Breath sounds: Normal breath sounds.  Chest:     Comments: Port-a-Cath in L chest Abdominal:     Palpations: Abdomen is soft.  Musculoskeletal:         General: No swelling.  Skin:    General: Skin is warm.  Neurological:     General: No focal deficit present.     Mental Status: She is alert and oriented to person, place, and time.  Psychiatric:        Mood and Affect: Mood normal.        Behavior: Behavior normal.     LABORATORY DATA:  I have reviewed the labs as listed.  CBC Latest Ref Rng & Units 05/31/2020 05/05/2020 04/12/2020  WBC 4.0 - 10.5 K/uL 10.4 10.8(H) 6.8  Hemoglobin 12.0 - 15.0 g/dL 12.4 11.5(L) 11.0(L)  Hematocrit 36.0 - 46.0 % 39.8 36.4 35.8(L)  Platelets 150 - 400 K/uL 554(H) 596(H) 589(H)   CMP Latest Ref Rng & Units 05/31/2020 05/05/2020 04/12/2020  Glucose 70 - 99 mg/dL 170(H) 141(H) 96  BUN 8 - 23 mg/dL '17 19 14  ' Creatinine 0.44 - 1.00 mg/dL 0.89 1.09(H) 1.01(H)  Sodium 135 - 145 mmol/L 137 136 137  Potassium 3.5 - 5.1 mmol/L 3.3(L) 3.2(L) 3.4(L)  Chloride 98 - 111 mmol/L 104 100 102  CO2 22 - 32 mmol/L '26 28 27  ' Calcium 8.9 - 10.3 mg/dL 9.6 9.3 8.6(L)  Total Protein 6.5 - 8.1 g/dL 6.6 6.8 6.3(L)  Total Bilirubin 0.3 - 1.2 mg/dL 0.3 <0.1(L) 0.6  Alkaline Phos 38 - 126 U/L 58 58 61  AST 15 - 41 U/L '23 23 22  ' ALT 0 - 44 U/L '13 13 12    ' DIAGNOSTIC IMAGING:  I have independently reviewed the scans and discussed with the patient. MR Brain W Wo Contrast  Result Date: 05/24/2020 CLINICAL DATA:  Small cell lung cancer, assess treatment response. EXAM: MRI HEAD WITHOUT AND WITH CONTRAST TECHNIQUE: Multiplanar, multiecho pulse sequences of the brain and surrounding structures were obtained without and with intravenous contrast. CONTRAST:  37m MULTIHANCE GADOBENATE DIMEGLUMINE 529 MG/ML IV SOLN Contrast administration was performed via previously implanted port catheter, assessed by a registered nurse. COMPARISON:  MRI of the brain September 15, 2019 FINDINGS: Brain: No acute infarction, hemorrhage, hydrocephalus or extra-axial collection. New subependymal enhancing lesion in the atrium of the right lateral ventricle  measuring approximately 2.1 x 2.0 x 0.8 cm. Subependymal extension of contrast enhancement along the right occipital horn. Surrounding vasogenic edema is seen  in the adjacent white matter. No other enhancing lesion identified. Vascular: Normal flow voids. Skull and upper cervical spine: Improved marrow signal of the C3 vertebral body. Sinuses/Orbits: Negative. Other: Bilateral mastoid effusion, more pronounced on the right IMPRESSION: 1. New subependymal enhancing lesion in the atrium of the right lateral ventricle measuring 2.1 cm, concerning for metastatic disease. Subependymal extension of contrast enhancement along the right occipital horn. 2. Improved marrow signal of the C3 vertebral body, likely degenerative. 3. Bilateral mastoid effusion, more pronounced on the right. Electronically Signed   By: Pedro Earls M.D.   On: 05/24/2020 15:18   NM PET Image Restag (PS) Skull Base To Thigh  Result Date: 05/27/2020 CLINICAL DATA:  Subsequent treatment strategy for small-cell lung cancer. EXAM: NUCLEAR MEDICINE PET SKULL BASE TO THIGH TECHNIQUE: 5.0 mCi F-18 FDG was injected intravenously. Full-ring PET imaging was performed from the skull base to thigh after the radiotracer. CT data was obtained and used for attenuation correction and anatomic localization. Fasting blood glucose: 89 mg/dl COMPARISON:  02/12/2020. FINDINGS: Mediastinal blood pool activity: SUV max 1.9 Liver activity: SUV max NA NECK: No hypermetabolic lymph nodes in the neck. Incidental CT findings: none CHEST: Volume loss right hemithorax compatible with history of prior right upper lobectomy. Interval development of a new 2.7 cm short axis nodal lesion in the subcarinal station with SUV max = 8.7. New hypermetabolic lymphadenopathy in the right hilum is not well demonstrated on noncontrast CT imaging with SUV max = 8.9. 7 mm short axis precarinal node on 35/5 is hypermetabolic with SUV max = 3.4. Hypermetabolism is seen along  the medial staple line corresponding to image 60 of series 4. No hypermetabolic left hilar, supraclavicular or axillary lymphadenopathy. Incidental CT findings: Left Port-A-Cath tip is positioned in the distal SVC. Centrilobular and paraseptal emphysema evident. Scarring noted right lung apex and at the right base. Small pericardial effusion. No pleural effusion. ABDOMEN/PELVIS: No abnormal hypermetabolic activity within the liver, pancreas, adrenal glands, or spleen. No hypermetabolic lymph nodes in the abdomen or pelvis. Incidental CT findings: Bilateral renal cysts are similar including probable hemorrhagic/proteinaceous cyst in the lower interpolar left kidney. Scattered parenchymal calcification in the pancreas suggest chronic pancreatitis. There is abdominal aortic atherosclerosis without aneurysm. SKELETON: No focal hypermetabolic activity to suggest skeletal metastasis. Incidental CT findings: Cervical and lumbar fusion hardware evident. IMPRESSION: 1. Interval development of relatively bulky hypermetabolic subcarinal and right hilar lymphadenopathy consistent with metastatic disease. Hypermetabolic lymph nodes are seen in the right paratracheal and precarinal stations as well. 2. Small focus of hypermetabolism along the medial staple line of the left upper lung is suspicious for local recurrence. 3. No unexpected or suspicious hypermetabolic disease in the neck, abdomen, or pelvis. 4.  Aortic Atherosclerois (ICD10-170.0) 5.  Emphysema. ([id number]) Electronically Signed   By: Misty Stanley M.D.   On: 05/27/2020 11:12     ASSESSMENT:  1.  Extensive stage small cell lung cancer: -Right upper lobectomy on 12/30/2018, pathology-4.2 cm small cell lung cancer, invading visceral pleura, 1/3 lymph nodes positive, LVI positive, metastatic carcinoma in 211 or lymph nodes, right chest wall biopsy consistent with small cell carcinoma. -30 Gy of radiation in 10 fractions from 01/28/2019 through 02/10/2019. -4  cycles of carboplatin and etoposide from 03/09/2019 through 05/11/2019. -PCI completed on 07/31/2019. -We reviewed MRI of the brain on 09/15/2019 with no evidence of brain meta stasis. Concern for hypointense appearance at the C3 vertebral body and left articular process. -CT chest on 09/15/2019 shows  numerous new small solid irregular pulmonary nodules scattered throughout both lungs, largest 7 mm on the right lower lobe. Tiny loculated anterior right pleural effusion decreased. Stable mild subcarinal adenopathy. Bilateral stable adrenal adenomas. -MRI of the C-spine on 10/13/2019 showed marrow edema and enhancement involving C3 vertebral body and left articular process without discrete bone lesion. This was thought to be degenerative. -PET scan on 09/28/2019 shows bilateral lung nodules, below PET resolution. Small right-sided nodular densities demonstrating low-level hypermetabolism. Mild hypermetabolic corresponding to a normal-sized subcarinal lymph node. Multifocal right pleural hypermetabolism in the setting of pleural thickening and prior right upper lobectomy, indeterminate. No hypermetabolic extrathoracic disease. -PET scan on 11/24/2019 shows progressive increased soft tissue within the right lung along the suture margins, positive right paratracheal lymph node, small subcapsular focus of increased radiotracer uptake overlying the anterolateral right hepatic lobe concerning for tumor. -PET scan on 05/26/2020 with progression with bulky hypermetabolic subcarinal and right hilar lymphadenopathy.  Hypermetabolic lymph nodes in the right paratracheal and precarinal stations.   PLAN:  1.  Extensive stage small cell lung cancer: -We discussed side effects of topotecan in detail. -I will start at a lower dose of topotecan 1 mg per metered square for the first cycle and see how she tolerates it. -Reviewed her labs today.  She will proceed with her cycle 1 today. -RTC 3 weeks for follow-up prior to  next cycle.  2. Essential thrombocytosis: -Hydrea on hold since chemotherapy started.  Platelet count today is 533.  3. Macrocytic anemia: -Hemoglobin today is 13 with MCV of 89.  4. Weight loss: -Continue dexamethasone in the mornings and Remeron 15 mg daily.  5. Mid back and sternal pain: -Continue oxycodone 5 mg 1 tablet in the morning, 1 tablet at noon and 2 tablets at bedtime.  Alternate with tramadol.  6. Hypokalemia: -Continue potassium 20 mEq daily.  7.  Brain metastasis: -She met with Dr. Isidore Moos and was recommended SRS. -She will have 3T MRI on 06/13/2020 followed by consultation with neurosurgeon. -She has more headaches in the last few days.  I have told her to increase dexamethasone to 2 mg in the mornings.   Orders placed this encounter:  No orders of the defined types were placed in this encounter.    Derek Jack, MD [place] [phone]   I, Milinda Antis, am acting as a scribe for Dr. Sanda Linger.  I, Derek Jack MD, have reviewed the above documentation for accuracy and completeness, and I agree with the above.

## 2020-06-02 NOTE — Progress Notes (Signed)
Histology and Location of Primary Cancer:  Small cell lung carcinoma, RIGHT  Sites of Visceral and Bony Metastatic Disease:  PET Scan 05/26/2020 --IMPRESSION: 1. Interval development of relatively bulky hypermetabolic subcarinal and right hilar lymphadenopathy consistent with metastatic disease. Hypermetabolic lymph nodes are seen in the right paratracheal and precarinal stations as well. 2. Small focus of hypermetabolism along the medial staple line of the left upper lung is suspicious for local recurrence. 3. No unexpected or suspicious hypermetabolic disease in the neck, abdomen, or pelvis. 4.  Aortic Atherosclerois (ICD10-170.0) 5.  Emphysema. ([id number])  MRI Brain w and w/o Contrast  05/24/2020 --IMPRESSION: 1. New subependymal enhancing lesion in the atrium of the right lateral ventricle measuring 2.1 cm, concerning for metastatic disease. Subependymal extension of contrast enhancement along the right occipital horn. 2. Improved marrow signal of the C3 vertebral body, likely degenerative. 3. Bilateral mastoid effusion, more pronounced on the right.  Past/Anticipated chemotherapy by medical oncology, if any:  Under care of Dr. Derek Jack 05/31/2020 ASSESSMENT:  1.  Extensive stage small cell lung cancer: -Right upper lobectomy on 12/30/2018, pathology-4.2 cm small cell lung cancer, invading visceral pleura, 1/3 lymph nodes positive, LVI positive, metastatic carcinoma in 211 or lymph nodes, right chest wall biopsy consistent with small cell carcinoma. -30 Gy of radiation in 10 fractions from 01/28/2019 through 02/10/2019. -4 cycles of carboplatin and etoposide from 03/09/2019 through 05/11/2019. -PCI completed on 07/31/2019. -We reviewed MRI of the brain on 09/15/2019 with no evidence of brain meta stasis. Concern for hypointense appearance at the C3 vertebral body and left articular process. -CT chest on 09/15/2019 shows numerous new small solid irregular pulmonary  nodules scattered throughout both lungs, largest 7 mm on the right lower lobe. Tiny loculated anterior right pleural effusion decreased. Stable mild subcarinal adenopathy. Bilateral stable adrenal adenomas. -MRI of the C-spine on 10/13/2019 showed marrow edema and enhancement involving C3 vertebral body and left articular process without discrete bone lesion. This was thought to be degenerative. -PET scan on 09/28/2019 shows bilateral lung nodules, below PET resolution. Small right-sided nodular densities demonstrating low-level hypermetabolism. Mild hypermetabolic corresponding to a normal-sized subcarinal lymph node. Multifocal right pleural hypermetabolism in the setting of pleural thickening and prior right upper lobectomy, indeterminate. No hypermetabolic extrathoracic disease. -PET scan on 11/24/2019 shows progressive increased soft tissue within the right lung along the suture margins, positive right paratracheal lymph node, small subcapsular focus of increased radiotracer uptake overlying the anterolateral right hepatic lobe concerning for tumor. -PET scan on 05/26/2020 with progression with bulky hypermetabolic subcarinal and right hilar lymphadenopathy.  Hypermetabolic lymph nodes in the right paratracheal and precarinal stations. PLAN:  1.  Extensive stage small cell lung cancer: -We reviewed results of the PET scan which showed interval development of metastatic disease in the lymph nodes in the subcarinal and right hilar region and right paratracheal lesion.  There is also small focus of hypermetabolism along the staple line in the left upper lung suspicious for local recurrence. -We discussed options including best supportive care versus active therapy.  She would want to continue therapy.  I think she has decent functional status. -Recommend topotecan days 1 through 5 every 21 days.  Discussed side effects including cytopenias and fatigue. -Tentatively will start next Monday.  Pain on  a scale of 0-10 is: Reports constant headaches. She states when they are at their most intense, they tend to be an 8 out of 10   Current Decadron regimen, if applicable: 1mg  PO daily (2 mg  tablet that she breaks in half)  Ambulatory status? Walker? Wheelchair?: Presents to clinic in a wheelchair. Reports her gait is unstable around the house, so she uses a cane or walker  SAFETY ISSUES:  Prior radiation? Yes: Dr. Gery Pray -07/20/2019--07/31/2019 (Prophylactic cranial irradiation)   -01/28/2019--02/10/2019   Pacemaker/ICD? No  Possible current pregnancy? No--hysterectomy 2016  Is the patient on methotrexate? No  Current Complaints / other details:   Patient has received both Moderna vaccines as well as Moderna booster

## 2020-06-03 ENCOUNTER — Ambulatory Visit
Admission: RE | Admit: 2020-06-03 | Discharge: 2020-06-03 | Disposition: A | Discharge status: Home / Self Care | Payer: Medicare Other | Source: Ambulatory Visit | Attending: Radiation Oncology | Admitting: Radiation Oncology

## 2020-06-03 ENCOUNTER — Encounter: Payer: Self-pay | Admitting: Radiation Oncology

## 2020-06-03 ENCOUNTER — Other Ambulatory Visit: Payer: Self-pay

## 2020-06-03 VITALS — BP 136/84 | HR 87 | Temp 97.2°F | Resp 18 | Ht 62.0 in | Wt 99.1 lb

## 2020-06-03 DIAGNOSIS — Z85820 Personal history of malignant melanoma of skin: Secondary | ICD-10-CM | POA: Insufficient documentation

## 2020-06-03 DIAGNOSIS — Z87891 Personal history of nicotine dependence: Secondary | ICD-10-CM | POA: Insufficient documentation

## 2020-06-03 DIAGNOSIS — C3411 Malignant neoplasm of upper lobe, right bronchus or lung: Secondary | ICD-10-CM | POA: Diagnosis not present

## 2020-06-03 DIAGNOSIS — J439 Emphysema, unspecified: Secondary | ICD-10-CM | POA: Diagnosis not present

## 2020-06-03 DIAGNOSIS — Z923 Personal history of irradiation: Secondary | ICD-10-CM | POA: Diagnosis not present

## 2020-06-03 DIAGNOSIS — C7931 Secondary malignant neoplasm of brain: Secondary | ICD-10-CM | POA: Diagnosis present

## 2020-06-03 DIAGNOSIS — I714 Abdominal aortic aneurysm, without rupture: Secondary | ICD-10-CM | POA: Insufficient documentation

## 2020-06-03 DIAGNOSIS — E559 Vitamin D deficiency, unspecified: Secondary | ICD-10-CM | POA: Insufficient documentation

## 2020-06-03 DIAGNOSIS — Z809 Family history of malignant neoplasm, unspecified: Secondary | ICD-10-CM | POA: Diagnosis not present

## 2020-06-03 DIAGNOSIS — D649 Anemia, unspecified: Secondary | ICD-10-CM | POA: Diagnosis not present

## 2020-06-03 DIAGNOSIS — M129 Arthropathy, unspecified: Secondary | ICD-10-CM | POA: Diagnosis not present

## 2020-06-03 DIAGNOSIS — K219 Gastro-esophageal reflux disease without esophagitis: Secondary | ICD-10-CM | POA: Diagnosis not present

## 2020-06-03 DIAGNOSIS — I2510 Atherosclerotic heart disease of native coronary artery without angina pectoris: Secondary | ICD-10-CM | POA: Insufficient documentation

## 2020-06-03 DIAGNOSIS — I509 Heart failure, unspecified: Secondary | ICD-10-CM | POA: Diagnosis not present

## 2020-06-03 DIAGNOSIS — Z853 Personal history of malignant neoplasm of breast: Secondary | ICD-10-CM | POA: Diagnosis not present

## 2020-06-03 DIAGNOSIS — M8580 Other specified disorders of bone density and structure, unspecified site: Secondary | ICD-10-CM | POA: Diagnosis not present

## 2020-06-03 DIAGNOSIS — E7800 Pure hypercholesterolemia, unspecified: Secondary | ICD-10-CM | POA: Diagnosis not present

## 2020-06-03 DIAGNOSIS — I10 Essential (primary) hypertension: Secondary | ICD-10-CM | POA: Diagnosis not present

## 2020-06-03 MED ORDER — ALPRAZOLAM 0.5 MG PO TABS: ORAL_TABLET | ORAL | 0 refills | Status: AC

## 2020-06-03 NOTE — Progress Notes (Addendum)
Radiation Oncology         [phone] ________________________________  Outpatient Re-Consultation  Name: Ashley [name] MRN: [id number]  Date: 06/03/2020  DOB: [birth year]  LF:YBOFBPZ, Laurita Quint, NP  Derek Jack, MD   REFERRING PHYSICIAN: Derek Jack, MD  DIAGNOSIS:     ICD-10-CM   1. Metastasis to brain (HCC)  C79.31 ALPRAZolam (XANAX) 0.5 MG tablet  2. Brain metastasis ([place])  C79.31     STAGE IV small cell lung cancer  HISTORY OF PRESENT ILLNESS::Ashley [name] is a 70 y.o. female who has been treated by Dr. 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 She was initially see in consultation on 01/22/2019 and was treated with a total of 30 Gy given in 3 Gy per fraction for a total of 10 fractions (6X, 10X) from 01/28/2019 to 02/10/2019. Prior to radiation therapy, she had undergone surgery to remove her lung mass.   The patient underwent four cycles of adjuvant chemotherapy with Carboplatin and VP-16 from 03/09/2019 to 05/11/2019 under the care of Dr. Delton Coombes.  She was reevaluated by Dr. Sondra Come on 06/16/2019 to discuss prophylactic cranial irradiation. She received a total of 25 Gy given in 2.5 Gy per fraction for a total of 10 fractions (6X) directed at the brain from 07/20/2019 to 07/31/2019. She was last seen in follow-up on 08/31/2019, at which time she was recovering from the effects of radiation. She was to continue close follow-up with Dr. Delton Coombes and follow-up with radiation oncology on an as-needed basis.  MRI of the brain on 05/24/2020 demonstrated new subependymal enhancing lesion in the atrium of the right lateral ventricle measuring 2.1 cm, concerning for metastatic disease. There was also noted to be subependymal extension of contrast enhancement along the right occipital horn. Marrow signal of the C3 vertebral body had improved and was likely degenerative. Finally, there was found to be bilateral mastoid effusion, more  pronounced on the right.  She also has a history of breast cancer and melanoma.  She is here with her daughter today.  She reports constant headaches.  When they're most intense they're 8 out of 10 in severity.  She is taking 1 mg of Decadron daily.  She reports that if she takes more than this, she gets very shaky.  She prefers to stay at this dose unless her headaches get unbearable.  She reports that her gait is unstable and she has to hold onto things like furniture around the house when she walks.  Otherwise she uses a cane or walker.  PREVIOUS RADIATION THERAPY: As above  PAST MEDICAL HISTORY:  has a past medical history of Adenocarcinoma of left breast ([place]) (01/09/2016), Anginal pain ([place]), Arthritis, Ascending aortic aneurysm ([place]), Cancer ([place]) (1995), Chest pain, CHF (congestive heart failure) ([place]) (11/17/2015), Colon adenomas, Coronary artery disease, DJD (degenerative joint disease), Dyspnea, Emphysema of lung ([place]) (11/17/2015), Essential hypertension, benign, GERD (gastroesophageal reflux disease), History of hiatal hernia, Hyperlipidemia, Hypertension, Melanoma ([place]) (01/09/2016), Osteopenia, Palpitations, Pernicious anemia (03/06/2016), Pernicious anemia, Pure hypercholesterolemia, Raynaud's disease, Thrombocythemia, essential ([place]) (01/09/2016), Thrombocytosis, and Vitamin D deficiency.    PAST SURGICAL HISTORY: Past Surgical History:  Procedure Laterality Date  . ABDOMINAL HYSTERECTOMY    . ANTERIOR AND POSTERIOR REPAIR N/A 12/09/2014   Procedure: ANTERIOR (CYSTOCELE) AND POSTERIOR REPAIR (RECTOCELE);  Surgeon: Bjorn Loser, MD;  Location: [facility];  Service: Urology;  Laterality: N/A;  . ANTERIOR CERVICAL DECOMPRESSION/DISCECTOMY FUSION 4 LEVELS Right 10/03/2016   Procedure: ANTERIOR CERVICAL DECOMPRESSION  FUSION, CERVICAL 4-5, CERVICAL 5-6, CERVICAL 6-7, CERVICAL 7 TO THORACIC 1 WITH INSTRUMENTATION AND ALLOGRAFT;  Surgeon: Phylliss Bob, MD;  Location: [facility];  Service: Orthopedics;   Laterality: Right;  ANTERIOR CERVICAL DECOMPRESSION FUSION, CERVICAL 4-5, CERVICAL 5-6, CERVICAL 6-7, CERVICAL 7 TO THORACIC 1 WITH INSTRUMENTATION AND ALLOGRAFT; REQUEST 4 HO  . ANTERIOR LAT LUMBAR FUSION Left 11/16/2015   Procedure: LEFT SIDED LATERAL INTERBODY FUSION, LUMBAR 2-3, LUMBAR 3-4, LUMBAR 4-5 WITH INSTRUMENTATION;  Surgeon: Phylliss Bob, MD;  Location: [facility];  Service: Orthopedics;  Laterality: Left;  LEFT SIDED LATEARL INTERBODY FUSION, LUMBAR 2-3, LUMBAR 3-4, LUMBAR 4-5 WITH INSTRUMENTATION   . APPENDECTOMY    . BACK SURGERY    . BONE MARROW ASPIRATION  07/2012  . BONE MARROW BIOPSY  07/2012  . BREAST SURGERY    . CARDIAC CATHETERIZATION    . CARDIAC CATHETERIZATION N/A 01/20/2016   Procedure: Left Heart Cath and Coronary Angiography;  Surgeon: Burnell Blanks, MD;  Location: [facility];  Service: Cardiovascular;  Laterality: N/A;  . COLONOSCOPY  11/29/2010   Procedure: COLONOSCOPY;  Surgeon: Rogene Houston, MD;  [facility];  Service: Endoscopy;  Laterality: N/A;  . COLONOSCOPY N/A 02/18/2014   Procedure: COLONOSCOPY;  Surgeon: Rogene Houston, MD;  [facility];  Service: Endoscopy;  Laterality: N/A;  1030  . COLONOSCOPY N/A 02/25/2017   Procedure: COLONOSCOPY;  Surgeon: Rogene Houston, MD;  [facility];  Service: Endoscopy;  Laterality: N/A;  10:55  . CYSTOSCOPY N/A 12/09/2014   Procedure: CYSTOSCOPY;  Surgeon: Bjorn Loser, MD;  Location: [facility];  Service: Urology;  Laterality: N/A;  . ESOPHAGEAL DILATION N/A 02/25/2017   Procedure: ESOPHAGEAL DILATION;  Surgeon: Rogene Houston, MD;  [facility];  Service: Endoscopy;  Laterality: N/A;  . ESOPHAGOGASTRODUODENOSCOPY N/A 02/25/2017   Procedure: ESOPHAGOGASTRODUODENOSCOPY (EGD);  Surgeon: Rogene Houston, MD;  [facility];  Service: Endoscopy;  Laterality: N/A;  . IR GENERIC HISTORICAL  01/11/2016   IR RADIOLOGY PERIPHERAL GUIDED IV START 01/11/2016  Saverio Danker, PA-C [facility]  . IR GENERIC HISTORICAL  01/11/2016   IR US GUIDE VASC ACCESS RIGHT 01/11/2016 Saverio Danker, PA-C [facility]  . LYMPH NODE DISSECTION Right 12/30/2018   Procedure: Lymph Node Dissection;  Surgeon: Lajuana Matte, MD;  Location: [facility];  Service: Thoracic;  Laterality: Right;  . MASTECTOMY     left  . OVARIAN CYST SURGERY     x2  . POLYPECTOMY  02/25/2017   Procedure: POLYPECTOMY;  Surgeon: Rogene Houston, MD;  [facility];  Service: Endoscopy;;  . PORTACATH PLACEMENT Left 02/16/2019   Procedure: INSERTION PORT-A-CATH (CATHETER  LEFT SUBCLAVIAN);  Surgeon: Aviva Signs, MD;  Location[facility];  Service: General;  Laterality: Left;  . SALPINGOOPHORECTOMY Bilateral 12/09/2014   Procedure: SALPINGO OOPHORECTOMY;  Surgeon: Servando Salina, MD;  Location: [facility];  Service: Gynecology;  Laterality: Bilateral;  . TUBAL LIGATION    . VAGINAL HYSTERECTOMY N/A 12/09/2014   Procedure: HYSTERECTOMY VAGINAL;  Surgeon: Servando Salina, MD;  Location: [facility];  Service: Gynecology;  Laterality: N/A;  . VIDEO ASSISTED THORACOSCOPY (VATS)/ LOBECTOMY Right 12/30/2018   Procedure: VIDEO ASSISTED THORACOSCOPY (VATS)/RIGHT LOWER LOBE WEDGE RESECTION, RIGHT UPPER LOBECTOMY;  Surgeon: Lajuana Matte, MD;  Location: [facility];  Service: Thoracic;  Laterality: Right;  Marland Kitchen VIDEO BRONCHOSCOPY N/A 12/30/2018   Procedure: VIDEO BRONCHOSCOPY;  Surgeon: Lajuana Matte, MD;  Location: [facility];  Service: Thoracic;  Laterality: N/A;    FAMILY HISTORY: family history includes CAD in her father; COPD in her mother; Cancer in her mother and another family member; Celiac disease in an other family member; Congestive Heart Failure in her mother; Heart attack in her father; Heart failure in her mother; Hypertension in her father, mother, and sister; Pernicious anemia in her mother.  SOCIAL HISTORY:  reports that she quit smoking about 18 months ago. Her smoking use  included cigarettes. She has a 25.00 pack-year smoking history. She has never used smokeless tobacco. She reports current alcohol use. She reports that she does not use drugs.  ALLERGIES: Penicillins  MEDICATIONS:  Current Outpatient Medications  Medication Sig Dispense Refill  . ALPRAZolam (XANAX) 0.5 MG tablet Take one tablet prior to MRIs and/or radiation oncology procedures. 6 tablet 0  . albuterol (PROVENTIL HFA;VENTOLIN HFA) 108 (90 Base) MCG/ACT inhaler Inhale 2 puffs into the lungs every 6 (six) hours as needed for wheezing or shortness of breath. 1 Inhaler 2  . albuterol (PROVENTIL) (2.5 MG/3ML) 0.083% nebulizer solution Take 2.5 mg by nebulization every 6 (six) hours.    Marland Kitchen amLODipine (NORVASC) 2.5 MG tablet 1 tablet    . aspirin EC 81 MG tablet Take 81 mg by mouth daily.    . Calcium Carb-Cholecalciferol 600-800 MG-UNIT TABS Take 1 tablet by mouth 2 (two) times daily.    . cefdinir (OMNICEF) 300 MG capsule Take 300 mg by mouth 2 (two) times daily.    . Cholecalciferol (VITAMIN D-3) 1000 units CAPS Take 1,000 Units daily by mouth.     . clonazePAM (KLONOPIN) 0.5 MG tablet TAKE ONE TABLET BY MOUTH AT BEDTIME. 90 tablet 4  . cyanocobalamin (,VITAMIN B-12,) 1000 MCG/ML injection INJECT 1 ML INTO THE MUSCLE ONCE MONTHLY AS DIRECTED. (Patient taking differently: Inject 1,000 mcg into the muscle every 30 (thirty) days.) 1 mL 5  . dexamethasone (DECADRON) 2 MG tablet Take 1 tablet (2 mg total) by mouth daily. 30 tablet 3  . diclofenac Sodium (VOLTAREN) 1 % GEL Apply 2 grams to hands    . ergocalciferol (VITAMIN D2) 1.25 MG (50000 UT) capsule Take by mouth.    . fluconazole (DIFLUCAN) 150 MG tablet     . furosemide (LASIX) 40 MG tablet 1/2 in Am and 1 tablet in PM    . gabapentin (NEURONTIN) 600 MG tablet Take 1 tablet (600 mg total) by mouth daily. 30 tablet 6  . guaiFENesin (MUCINEX) 600 MG 12 hr tablet Take 1 tablet (600 mg total) by mouth 2 (two) times daily as needed for cough or to  loosen phlegm.    . hydroxyurea (HYDREA) 500 MG capsule capsule    . ibandronate (BONIVA) 150 MG tablet Take 150 mg by mouth every 30 (thirty) days.     Marland Kitchen ibuprofen (ADVIL) 800 MG tablet Take 1 tablet (800 mg total) by mouth every 8 (eight) hours as needed for moderate pain. 30 tablet 6  . ipratropium (ATROVENT) 0.02 % nebulizer solution Take by nebulization.    Marland Kitchen ipratropium-albuterol (DUONEB) 0.5-2.5 (3) MG/3ML SOLN Take 3 mLs by nebulization every 6 (six) hours as needed (shortness of breath). 360 mL 0  . lidocaine-prilocaine (EMLA) cream Apply a small amount to port a cath site and cover with plastic wrap 1 hour prior to chemotherapy appointments 30 g 3  . magic mouthwash w/lidocaine SOLN 10 cc swab and swallow    . Meclizine HCl 25 MG CHEW 1 tablet    .  meloxicam (MOBIC) 15 MG tablet 1 tablet    . methocarbamol (ROBAXIN) 500 MG tablet Take 500 mg by mouth every 6 (six) hours as needed for muscle spasms.     . metoCLOPramide (REGLAN) 5 MG tablet 1 tablet before meals    . mirtazapine (REMERON) 15 MG tablet Take 1 tablet (15 mg total) by mouth at bedtime. 30 tablet 0  . nitroGLYCERIN (NITRODUR - DOSED IN MG/24 HR) 0.1 mg/hr patch Place 0.1 mg onto the skin daily.    . ondansetron (ZOFRAN ODT) 8 MG disintegrating tablet Take 1 tablet (8 mg total) by mouth every 8 (eight) hours as needed for nausea or vomiting. 60 tablet 2  . oxyCODONE (OXY IR/ROXICODONE) 5 MG immediate release tablet Take 1 tablet (5 mg total) by mouth every 8 (eight) hours. 90 tablet 0  . OXYGEN Inhale 3 L into the lungs daily.    . pantoprazole (PROTONIX) 40 MG tablet TAKE ONE TABLET BY MOUTH TWICE DAILY. 60 tablet 0  . polyethylene glycol (MIRALAX / GLYCOLAX) 17 g packet Take 17 g by mouth daily as needed for mild constipation.    . potassium chloride SA (KLOR-CON) 20 MEQ tablet Take 1 tablet (20 mEq total) by mouth 3 (three) times daily. (Patient taking differently: Take 20 mEq by mouth daily.) 60 tablet 2  .  prochlorperazine (COMPAZINE) 10 MG tablet 1 tablet as needed    . rizatriptan (MAXALT) 10 MG tablet Take 10 mg by mouth 2 (two) times daily. 1-2 times daily May repeat in 2 hours if needed    . rosuvastatin (CRESTOR) 40 MG tablet Take 40 mg by mouth at bedtime.    . sucralfate (CARAFATE) 1 g tablet Take 1 g by mouth 3 (three) times daily.    . traMADol (ULTRAM) 50 MG tablet Take 50 mg by mouth every 6 (six) hours as needed.    . triamcinolone cream (KENALOG) 0.1 %      No current facility-administered medications for this encounter.   Facility-Administered Medications Ordered in Other Encounters  Medication Dose Route Frequency Provider Last Rate Last Admin  . sodium chloride flush (NS) 0.9 % injection 10 mL  10 mL Intravenous PRN Lockamy, Randi L, NP-C   10 mL at 12/22/19 1240    REVIEW OF SYSTEMS:  As above.   PHYSICAL EXAM:  height is _0  (1.575 m) and weight is 99 lb 2 oz (45 kg). Her temporal temperature is 97.2 F (36.2 C) (abnormal). Her blood pressure is 136/84 and her pulse is 87. Her respiration is 18 and oxygen saturation is 99%.   General: Alert and oriented, in no acute distress -she is thin, sitting in a wheelchair HEENT: Head is normocephalic. Extraocular movements are intact. Oropharynx is clear. Musculoskeletal: symmetric strength and muscle tone throughout.  Evidence of muscle wasting Neurologic: Cranial nerves II through XII are grossly intact. No obvious focalities. Speech is fluent. Coordination is wobbly on finger-to-nose testing but grossly intact. Psychiatric: Judgment and insight are intact. Affect is appropriate.   LABORATORY DATA:  Lab Results  Component Value Date   WBC 10.4 05/31/2020   HGB 12.4 05/31/2020   HCT 39.8 05/31/2020   MCV 91.3 05/31/2020   PLT 554 (H) 05/31/2020   CMP     Component Value Date/Time   NA 137 05/31/2020 1241   K 3.3 (L) 05/31/2020 1241   CL 104 05/31/2020 1241   CO2 26 05/31/2020 1241   GLUCOSE 170 (H) 05/31/2020 1241    BUN  17 05/31/2020 1241   CREATININE 0.89 05/31/2020 1241   CALCIUM 9.6 05/31/2020 1241   PROT 6.6 05/31/2020 1241   ALBUMIN 3.5 05/31/2020 1241   AST 23 05/31/2020 1241   ALT 13 05/31/2020 1241   ALKPHOS 58 05/31/2020 1241   BILITOT 0.3 05/31/2020 1241   GFRNONAA >60 05/31/2020 1241   GFRAA >60 01/05/2020 1202         RADIOGRAPHY: MR Brain W Wo Contrast  Result Date: 05/24/2020 CLINICAL DATA:  Small cell lung cancer, assess treatment response. EXAM: MRI HEAD WITHOUT AND WITH CONTRAST TECHNIQUE: Multiplanar, multiecho pulse sequences of the brain and surrounding structures were obtained without and with intravenous contrast. CONTRAST:  53m MULTIHANCE GADOBENATE DIMEGLUMINE 529 MG/ML IV SOLN Contrast administration was performed via previously implanted port catheter, assessed by a registered nurse. COMPARISON:  MRI of the brain September 15, 2019 FINDINGS: Brain: No acute infarction, hemorrhage, hydrocephalus or extra-axial collection. New subependymal enhancing lesion in the atrium of the right lateral ventricle measuring approximately 2.1 x 2.0 x 0.8 cm. Subependymal extension of contrast enhancement along the right occipital horn. Surrounding vasogenic edema is seen in the adjacent white matter. No other enhancing lesion identified. Vascular: Normal flow voids. Skull and upper cervical spine: Improved marrow signal of the C3 vertebral body. Sinuses/Orbits: Negative. Other: Bilateral mastoid effusion, more pronounced on the right IMPRESSION: 1. New subependymal enhancing lesion in the atrium of the right lateral ventricle measuring 2.1 cm, concerning for metastatic disease. Subependymal extension of contrast enhancement along the right occipital horn. 2. Improved marrow signal of the C3 vertebral body, likely degenerative. 3. Bilateral mastoid effusion, more pronounced on the right. Electronically Signed   By: KPedro EarlsM.D.   On: 05/24/2020 15:18   NM PET Image Restag (PS) Skull  Base To Thigh  Result Date: 05/27/2020 CLINICAL DATA:  Subsequent treatment strategy for small-cell lung cancer. EXAM: NUCLEAR MEDICINE PET SKULL BASE TO THIGH TECHNIQUE: 5.0 mCi F-18 FDG was injected intravenously. Full-ring PET imaging was performed from the skull base to thigh after the radiotracer. CT data was obtained and used for attenuation correction and anatomic localization. Fasting blood glucose: 89 mg/dl COMPARISON:  02/12/2020. FINDINGS: Mediastinal blood pool activity: SUV max 1.9 Liver activity: SUV max NA NECK: No hypermetabolic lymph nodes in the neck. Incidental CT findings: none CHEST: Volume loss right hemithorax compatible with history of prior right upper lobectomy. Interval development of a new 2.7 cm short axis nodal lesion in the subcarinal station with SUV max = 8.7. New hypermetabolic lymphadenopathy in the right hilum is not well demonstrated on noncontrast CT imaging with SUV max = 8.9. 7 mm short axis precarinal node on 640/0is hypermetabolic with SUV max = 3.4. Hypermetabolism is seen along the medial staple line corresponding to image 60 of series 4. No hypermetabolic left hilar, supraclavicular or axillary lymphadenopathy. Incidental CT findings: Left Port-A-Cath tip is positioned in the distal SVC. Centrilobular and paraseptal emphysema evident. Scarring noted right lung apex and at the right base. Small pericardial effusion. No pleural effusion. ABDOMEN/PELVIS: No abnormal hypermetabolic activity within the liver, pancreas, adrenal glands, or spleen. No hypermetabolic lymph nodes in the abdomen or pelvis. Incidental CT findings: Bilateral renal cysts are similar including probable hemorrhagic/proteinaceous cyst in the lower interpolar left kidney. Scattered parenchymal calcification in the pancreas suggest chronic pancreatitis. There is abdominal aortic atherosclerosis without aneurysm. SKELETON: No focal hypermetabolic activity to suggest skeletal metastasis. Incidental CT  findings: Cervical and lumbar fusion hardware evident. IMPRESSION:  1. Interval development of relatively bulky hypermetabolic subcarinal and right hilar lymphadenopathy consistent with metastatic disease. Hypermetabolic lymph nodes are seen in the right paratracheal and precarinal stations as well. 2. Small focus of hypermetabolism along the medial staple line of the left upper lung is suspicious for local recurrence. 3. No unexpected or suspicious hypermetabolic disease in the neck, abdomen, or pelvis. 4.  Aortic Atherosclerois (ICD10-170.0) 5.  Emphysema. ([id number]-V67.9) Electronically Signed   By: Misty Stanley M.D.   On: 05/27/2020 11:12      IMPRESSION/PLAN: This is a very pleasant 70 year old female with metastatic disease to the brain and history of small cell lung cancer; she has a previous history of PCI to the brain.  I have personally reviewed her PET scan and her MRI of the brain.  She plans to switch systemic therapies to treat her visceral disease under the care of Dr. Delton Coombes.  I had a lengthy discussion with the patient after reviewing their MRI results with them.   I recommend stereotactic radiosurgery to salvage the single metastasis in the brain.  She will undergo a three Tesla MRI to verify if there are any other progressive metastases in the brain to be targeted simultaneously.  I refilled her Xanax to help her tolerate the MRI and procedures in radiation oncology as she does have claustrophobia.   After lengthy discussion, the patient would like to proceed with stereotactic brain radiosurgery to their metastatic disease.  We talked about the necessity to wear a mask for immobilization.  We discussed potential side effects including fatigue, headache, worsening neurologic symptoms, injury to the brain, radiation necrosis as possible sequela I radiosurgery. She will meet with neurosurgery in the near future to discuss this further; a neurosurgeon will participate in their case.  She is  enthusiastic about this plan.  Consent form was signed and placed in her chart.  CT simulation will take place after her MRI and treatment soon thereafter.  I plan to deliver 18 Gy in 1 fraction to the metastasis.  We discussed the option of increasing her dose of dexamethasone.  She does not feel that her neurologic symptoms are severe enough to raise the dose and she fears the side effects that she has a history of experiencing with dexamethasone in the past.  We will continue to monitor on her current dose.  On date of service, in total, I spent 60 minutes on this encounter. Patient was seen in person.  __________________________________________   Eppie Gibson, MD  This document serves as a record of services personally performed by Eppie Gibson, MD. It was created on his behalf by Clerance Lav, a trained medical scribe. The creation of this record is based on the scribe's personal observations and the provider's statements to them. This document has been checked and approved by the attending provider.

## 2020-06-06 ENCOUNTER — Inpatient Hospital Stay (HOSPITAL_COMMUNITY): Payer: Medicare Other

## 2020-06-06 ENCOUNTER — Encounter (HOSPITAL_COMMUNITY): Payer: Self-pay | Admitting: Hematology

## 2020-06-06 ENCOUNTER — Inpatient Hospital Stay (HOSPITAL_BASED_OUTPATIENT_CLINIC_OR_DEPARTMENT_OTHER): Payer: Medicare Other | Admitting: Hematology

## 2020-06-06 ENCOUNTER — Other Ambulatory Visit (HOSPITAL_COMMUNITY): Payer: Self-pay | Admitting: Hematology and Oncology

## 2020-06-06 ENCOUNTER — Other Ambulatory Visit: Payer: Self-pay

## 2020-06-06 VITALS — BP 105/60 | HR 79 | Temp 96.9°F | Resp 18

## 2020-06-06 VITALS — BP 91/63 | HR 95 | Temp 97.1°F | Resp 18 | Wt 94.2 lb

## 2020-06-06 DIAGNOSIS — C3491 Malignant neoplasm of unspecified part of right bronchus or lung: Secondary | ICD-10-CM

## 2020-06-06 DIAGNOSIS — Z5111 Encounter for antineoplastic chemotherapy: Secondary | ICD-10-CM | POA: Diagnosis not present

## 2020-06-06 LAB — CBC WITH DIFFERENTIAL/PLATELET
Abs Immature Granulocytes: 0.03 10*3/uL (ref 0.00–0.07)
Basophils Absolute: 0 10*3/uL (ref 0.0–0.1)
Basophils Relative: 0 %
Eosinophils Absolute: 0.2 10*3/uL (ref 0.0–0.5)
Eosinophils Relative: 2 %
HCT: 41.1 % (ref 36.0–46.0)
Hemoglobin: 13 g/dL (ref 12.0–15.0)
Immature Granulocytes: 0 %
Lymphocytes Relative: 11 %
Lymphs Abs: 1.2 10*3/uL (ref 0.7–4.0)
MCH: 28.4 pg (ref 26.0–34.0)
MCHC: 31.6 g/dL (ref 30.0–36.0)
MCV: 89.7 fL (ref 80.0–100.0)
Monocytes Absolute: 0.5 10*3/uL (ref 0.1–1.0)
Monocytes Relative: 5 %
Neutro Abs: 9.3 10*3/uL — ABNORMAL HIGH (ref 1.7–7.7)
Neutrophils Relative %: 82 %
Platelets: 533 10*3/uL — ABNORMAL HIGH (ref 150–400)
RBC: 4.58 MIL/uL (ref 3.87–5.11)
RDW: 15.9 % — ABNORMAL HIGH (ref 11.5–15.5)
WBC: 11.3 10*3/uL — ABNORMAL HIGH (ref 4.0–10.5)
nRBC: 0 % (ref 0.0–0.2)

## 2020-06-06 LAB — COMPREHENSIVE METABOLIC PANEL
ALT: 14 U/L (ref 0–44)
AST: 21 U/L (ref 15–41)
Albumin: 3.5 g/dL (ref 3.5–5.0)
Alkaline Phosphatase: 55 U/L (ref 38–126)
Anion gap: 10 (ref 5–15)
BUN: 19 mg/dL (ref 8–23)
CO2: 26 mmol/L (ref 22–32)
Calcium: 10 mg/dL (ref 8.9–10.3)
Chloride: 101 mmol/L (ref 98–111)
Creatinine, Ser: 0.97 mg/dL (ref 0.44–1.00)
GFR, Estimated: >60 mL/min (ref >60)
Glucose, Bld: 171 mg/dL — ABNORMAL HIGH (ref 70–99)
Potassium: 3.5 mmol/L (ref 3.5–5.1)
Sodium: 137 mmol/L (ref 135–145)
Total Bilirubin: 0.6 mg/dL (ref 0.3–1.2)
Total Protein: 6.9 g/dL (ref 6.5–8.1)

## 2020-06-06 LAB — MAGNESIUM: Magnesium: 1.9 mg/dL (ref 1.7–2.4)

## 2020-06-06 MED ORDER — ONDANSETRON HCL 4 MG/2ML IJ SOLN: 8.0000 mg | Freq: Once | INTRAMUSCULAR | Status: DC

## 2020-06-06 MED ORDER — SODIUM CHLORIDE 0.9 % IV SOLN
Freq: Once | INTRAVENOUS | Status: AC
  Administered 2020-06-06: 8 mg via INTRAVENOUS
  Filled 2020-06-06: qty 4

## 2020-06-06 MED ORDER — HEPARIN SOD (PORK) LOCK FLUSH 100 UNIT/ML IV SOLN
500.0000 [IU] | Freq: Once | INTRAVENOUS | Status: AC | PRN
  Administered 2020-06-06: 500 [IU]

## 2020-06-06 MED ORDER — SODIUM CHLORIDE 0.9% FLUSH
10.0000 mL | INTRAVENOUS | Status: DC | PRN
  Administered 2020-06-06: 10 mL

## 2020-06-06 MED ORDER — OXYCODONE HCL 5 MG PO TABS: 5.0000 mg | ORAL_TABLET | Freq: Three times a day (TID) | ORAL | 0 refills | Status: DC

## 2020-06-06 MED ORDER — SODIUM CHLORIDE 0.9 % IV SOLN: Freq: Once | INTRAVENOUS | Status: AC

## 2020-06-06 MED ORDER — TOPOTECAN HCL CHEMO INJECTION 4 MG
1.0000 mg/m2 | Freq: Once | INTRAVENOUS | Status: AC
  Administered 2020-06-06: 1.4 mg via INTRAVENOUS
  Filled 2020-06-06: qty 1.4

## 2020-06-06 NOTE — Patient Instructions (Signed)
[facility] Discharge Instructions  You were seen and examined today by Dr. Katragadda. Please follow up as scheduled.    Thank you for choosing [facility] at [facility] to provide your oncology and hematology care.  To afford each patient quality time with our provider, please arrive at least 15 minutes before your scheduled appointment time.   If you have a lab appointment with the Cancer Center please come in thru the Main Entrance and check in at the main information desk.  You need to re-schedule your appointment should you arrive 10 or more minutes late.  We strive to give you quality time with our providers, and arriving late affects you and other patients whose appointments are after yours.  Also, if you no show three or more times for appointments you may be dismissed from the clinic at the providers discretion.     Again, thank you for choosing [facility].  Our hope is that these requests will decrease the amount of time that you wait before being seen by our physicians.       _____________________________________________________________  Should you have questions after your visit to [facility], please contact our office at [phone] and follow the prompts.  Our office hours are 8:00 a.m. and 4:30 p.m. Monday - Friday.  Please note that voicemails left after 4:00 p.m. may not be returned until the following business day.  We are closed weekends and major holidays.  You do have access to a nurse 24-7, just call the main number to the clinic [phone] and do not press any options, hold on the line and a nurse will answer the phone.    For prescription refill requests, have your pharmacy contact our office and allow 72 hours.    Due to Covid, you will need to wear a mask upon entering the hospital. If you do not have a mask, a mask will be given to you at the Main Entrance upon arrival. For  doctor visits, patients may have 1 support person age 18 or older with them. For treatment visits, patients can not have anyone with them due to social distancing guidelines and our immunocompromised population.      

## 2020-06-06 NOTE — Progress Notes (Signed)
Patient was assessed by Dr. Delton Coombes and labs have been reviewed.  Patient is okay to proceed with treatment today. Pending lab results.Dose reduced 1 mg /1 unit sq. Primary RN and pharmacy aware.

## 2020-06-06 NOTE — Patient Instructions (Signed)
[facility] Discharge Instructions for Patients Receiving Chemotherapy  Today you received the following chemotherapy agents   To help prevent nausea and vomiting after your treatment, we encourage you to take your nausea medication   If you develop nausea and vomiting that is not controlled by your nausea medication, call the clinic.   BELOW ARE SYMPTOMS THAT SHOULD BE REPORTED IMMEDIATELY:  *FEVER GREATER THAN 100.5 F  *CHILLS WITH OR WITHOUT FEVER  NAUSEA AND VOMITING THAT IS NOT CONTROLLED WITH YOUR NAUSEA MEDICATION  *UNUSUAL SHORTNESS OF BREATH  *UNUSUAL BRUISING OR BLEEDING  TENDERNESS IN MOUTH AND THROAT WITH OR WITHOUT PRESENCE OF ULCERS  *URINARY PROBLEMS  *BOWEL PROBLEMS  UNUSUAL RASH Items with * indicate a potential emergency and should be followed up as soon as possible.  Feel free to call the clinic should you have any questions or concerns. The clinic phone number is [phone].  Please show the CHEMO ALERT CARD at check-in to the Emergency Department and triage nurse.   

## 2020-06-06 NOTE — Progress Notes (Signed)
Patient presents today for D1,C1 Topotecan infusion.  Labs pending.  Vital signs within parameters for treatment.  Patient has no new complaints since last visit.  Labs within parameters for treatment.  Consent obtained and on file.    Treatment given today per MD orders.  Tolerated infusion without adverse affects.  Vital signs stable.  No complaints at this time.  Discharge from clinic via wheelchair in stable condition.  Alert and oriented X 3.  Follow up with [facility] as scheduled.

## 2020-06-07 ENCOUNTER — Other Ambulatory Visit: Payer: Self-pay

## 2020-06-07 ENCOUNTER — Other Ambulatory Visit (HOSPITAL_COMMUNITY): Payer: Self-pay | Admitting: *Deleted

## 2020-06-07 ENCOUNTER — Inpatient Hospital Stay (HOSPITAL_COMMUNITY): Payer: Medicare Other

## 2020-06-07 VITALS — BP 98/57 | HR 83 | Temp 97.3°F | Resp 18

## 2020-06-07 DIAGNOSIS — C3491 Malignant neoplasm of unspecified part of right bronchus or lung: Secondary | ICD-10-CM

## 2020-06-07 DIAGNOSIS — Z5111 Encounter for antineoplastic chemotherapy: Secondary | ICD-10-CM | POA: Diagnosis not present

## 2020-06-07 MED ORDER — HEPARIN SOD (PORK) LOCK FLUSH 100 UNIT/ML IV SOLN
500.0000 [IU] | Freq: Once | INTRAVENOUS | Status: AC | PRN
  Administered 2020-06-07: 500 [IU]

## 2020-06-07 MED ORDER — SODIUM CHLORIDE 0.9 % IV SOLN
1.0000 mg/m2 | Freq: Once | INTRAVENOUS | Status: AC
  Administered 2020-06-07: 1.4 mg via INTRAVENOUS
  Filled 2020-06-07: qty 1.4

## 2020-06-07 MED ORDER — SODIUM CHLORIDE 0.9 % IV SOLN
Freq: Once | INTRAVENOUS | Status: AC
  Administered 2020-06-07: 8 mg via INTRAVENOUS
  Filled 2020-06-07: qty 4

## 2020-06-07 MED ORDER — SODIUM CHLORIDE 0.9% FLUSH
10.0000 mL | INTRAVENOUS | Status: DC | PRN
  Administered 2020-06-07: 10 mL

## 2020-06-07 MED ORDER — SODIUM CHLORIDE 0.9 % IV SOLN: Freq: Once | INTRAVENOUS | Status: AC

## 2020-06-07 MED ORDER — DEXAMETHASONE 2 MG PO TABS: 2.0000 mg | ORAL_TABLET | Freq: Every day | ORAL | 3 refills | Status: DC

## 2020-06-07 NOTE — Progress Notes (Signed)
Patient presents for day 2 Topotecan infusion.  Vital signs within parameters for treatment.  No new complaints since last visit.  Treatment given today per MD orders.  Tolerated infusion without adverse affects.  Vital signs stable.  No complaints at this time.  Discharge from clinic via wheelchair in stable condition.  Alert and oriented X 3.  Follow up with [facility] as scheduled.

## 2020-06-07 NOTE — Progress Notes (Signed)
06/07/2020 1125,  24 hour call back during day 2 infusion.  Patient is doing well and has no new issues since first Topotecan infusion.

## 2020-06-07 NOTE — Patient Instructions (Signed)

## 2020-06-08 ENCOUNTER — Inpatient Hospital Stay (HOSPITAL_COMMUNITY): Payer: Medicare Other

## 2020-06-08 ENCOUNTER — Encounter (HOSPITAL_COMMUNITY): Payer: Self-pay

## 2020-06-08 ENCOUNTER — Other Ambulatory Visit: Payer: Medicare Other

## 2020-06-08 VITALS — BP 119/80 | HR 70 | Temp 97.5°F | Resp 18

## 2020-06-08 DIAGNOSIS — Z5111 Encounter for antineoplastic chemotherapy: Secondary | ICD-10-CM | POA: Diagnosis not present

## 2020-06-08 DIAGNOSIS — C3491 Malignant neoplasm of unspecified part of right bronchus or lung: Secondary | ICD-10-CM

## 2020-06-08 MED ORDER — SODIUM CHLORIDE 0.9 % IV SOLN: Freq: Once | INTRAVENOUS | Status: AC

## 2020-06-08 MED ORDER — SODIUM CHLORIDE 0.9% FLUSH: 10.0000 mL | INTRAVENOUS | Status: DC | PRN

## 2020-06-08 MED ORDER — SODIUM CHLORIDE 0.9 % IV SOLN
Freq: Once | INTRAVENOUS | Status: AC
  Administered 2020-06-08: 8 mg via INTRAVENOUS
  Filled 2020-06-08: qty 4

## 2020-06-08 MED ORDER — HEPARIN SOD (PORK) LOCK FLUSH 100 UNIT/ML IV SOLN
500.0000 [IU] | Freq: Once | INTRAVENOUS | Status: AC | PRN
  Administered 2020-06-08: 500 [IU]

## 2020-06-08 MED ORDER — SODIUM CHLORIDE 0.9 % IV SOLN
1.0000 mg/m2 | Freq: Once | INTRAVENOUS | Status: AC
  Administered 2020-06-08: 1.4 mg via INTRAVENOUS
  Filled 2020-06-08: qty 1.4

## 2020-06-08 NOTE — Progress Notes (Signed)
Pt here for D3C1 of topotecan.  Vital signs WNL for treatment.  Pt is complaining of nausea and less energy.  Pt is getting zofran IV today and knows how to take her nausea medication at home.   Tolerated treatment well today without incidence.  Discharged in stable condition via wheelchair.  Vital signs stable prior to discharge.

## 2020-06-08 NOTE — Patient Instructions (Signed)
[facility] Discharge Instructions for Patients Receiving Chemotherapy  Today you received the following chemotherapy agents   To help prevent nausea and vomiting after your treatment, we encourage you to take your nausea medication   If you develop nausea and vomiting that is not controlled by your nausea medication, call the clinic.   BELOW ARE SYMPTOMS THAT SHOULD BE REPORTED IMMEDIATELY:  *FEVER GREATER THAN 100.5 F  *CHILLS WITH OR WITHOUT FEVER  NAUSEA AND VOMITING THAT IS NOT CONTROLLED WITH YOUR NAUSEA MEDICATION  *UNUSUAL SHORTNESS OF BREATH  *UNUSUAL BRUISING OR BLEEDING  TENDERNESS IN MOUTH AND THROAT WITH OR WITHOUT PRESENCE OF ULCERS  *URINARY PROBLEMS  *BOWEL PROBLEMS  UNUSUAL RASH Items with * indicate a potential emergency and should be followed up as soon as possible.  Feel free to call the clinic should you have any questions or concerns. The clinic phone number is [phone].  Please show the CHEMO ALERT CARD at check-in to the Emergency Department and triage nurse.   

## 2020-06-09 ENCOUNTER — Other Ambulatory Visit: Payer: Medicare Other

## 2020-06-09 ENCOUNTER — Inpatient Hospital Stay (HOSPITAL_COMMUNITY): Payer: Medicare Other

## 2020-06-09 ENCOUNTER — Other Ambulatory Visit: Payer: Self-pay

## 2020-06-09 VITALS — BP 92/51 | HR 74 | Temp 96.8°F | Resp 18

## 2020-06-09 DIAGNOSIS — Z5111 Encounter for antineoplastic chemotherapy: Secondary | ICD-10-CM | POA: Diagnosis not present

## 2020-06-09 DIAGNOSIS — C3491 Malignant neoplasm of unspecified part of right bronchus or lung: Secondary | ICD-10-CM

## 2020-06-09 MED ORDER — SODIUM CHLORIDE 0.9 % IV SOLN
Freq: Once | INTRAVENOUS | Status: AC
  Administered 2020-06-09: 8 mg via INTRAVENOUS
  Filled 2020-06-09: qty 4

## 2020-06-09 MED ORDER — TOPOTECAN HCL CHEMO INJECTION 4 MG
1.0000 mg/m2 | Freq: Once | INTRAVENOUS | Status: AC
  Administered 2020-06-09: 1.4 mg via INTRAVENOUS
  Filled 2020-06-09: qty 1.4

## 2020-06-09 MED ORDER — SODIUM CHLORIDE 0.9% FLUSH
10.0000 mL | INTRAVENOUS | Status: DC | PRN
  Administered 2020-06-09: 10 mL

## 2020-06-09 MED ORDER — SODIUM CHLORIDE 0.9 % IV SOLN: Freq: Once | INTRAVENOUS | Status: AC

## 2020-06-09 MED ORDER — HEPARIN SOD (PORK) LOCK FLUSH 100 UNIT/ML IV SOLN
500.0000 [IU] | Freq: Once | INTRAVENOUS | Status: AC | PRN
  Administered 2020-06-09: 500 [IU]

## 2020-06-09 NOTE — Progress Notes (Signed)
Ashley [name] presents today for D4C1 Topotecan. Pt denies any new changes or symptoms over night. Lab results and vitals have been reviewed and are stable and within parameters for treatment. Proceeding with treatment today as planned.  Infusions tolerated without incident or complaint. VSS upon completion of treatment. Port flushed and left accessed per protocol, see MAR and IV flowsheet for details. Discharged in satisfactory condition with follow up instructions.

## 2020-06-09 NOTE — Patient Instructions (Signed)
[facility] Discharge Instructions for Patients Receiving Chemotherapy   Beginning January 23rd 2017 lab work for the [facility] will be done in the  Main lab at [facility] on 1st floor. If you have a lab appointment with the [facility] please come in thru the  Main Entrance and check in at the main information desk   Today you received the following chemotherapy agents Topotecan  To help prevent nausea and vomiting after your treatment, we encourage you to take your nausea medication   If you develop nausea and vomiting, or diarrhea that is not controlled by your medication, call the clinic.  The clinic phone number is [phone]. Office hours are Monday-Friday 8:30am-5:00pm.  BELOW ARE SYMPTOMS THAT SHOULD BE REPORTED IMMEDIATELY:  *FEVER GREATER THAN 101.0 F  *CHILLS WITH OR WITHOUT FEVER  NAUSEA AND VOMITING THAT IS NOT CONTROLLED WITH YOUR NAUSEA MEDICATION  *UNUSUAL SHORTNESS OF BREATH  *UNUSUAL BRUISING OR BLEEDING  TENDERNESS IN MOUTH AND THROAT WITH OR WITHOUT PRESENCE OF ULCERS  *URINARY PROBLEMS  *BOWEL PROBLEMS  UNUSUAL RASH Items with * indicate a potential emergency and should be followed up as soon as possible. If you have an emergency after office hours please contact your primary care physician or go to the nearest emergency department.  Please call the clinic during office hours if you have any questions or concerns.   You may also contact the Patient Navigator at [phone] should you have any questions or need assistance in obtaining follow up care.      Resources For Cancer Patients and their Caregivers ? American Cancer Society: Can assist with transportation, wigs, general needs, runs Look Good Feel Better.        289 603 9388 ? Cancer Care: Provides financial assistance, online support groups, medication/co-pay assistance.  1-800-813-HOPE 985-140-5373) ? [place] Assists [place]  cancer patients and their families through emotional , educational and financial support.  (463) 402-9073 ? [place] DSS Where to apply for food stamps, Medicaid and utility assistance. (757)504-3459 ? [facility]: Transportation to medical appointments. 580-076-7692 ? Social Security Administration: May apply for disability if have a Stage IV cancer. (915)523-9974 854-730-8585 ? [employer], Disability and Transit Services: Assists with nutrition, care and transit needs. 9496142700

## 2020-06-10 ENCOUNTER — Inpatient Hospital Stay (HOSPITAL_COMMUNITY): Payer: Medicare Other

## 2020-06-10 ENCOUNTER — Other Ambulatory Visit: Payer: Self-pay

## 2020-06-10 VITALS — BP 108/57 | HR 75 | Temp 96.6°F | Resp 18

## 2020-06-10 DIAGNOSIS — C3491 Malignant neoplasm of unspecified part of right bronchus or lung: Secondary | ICD-10-CM

## 2020-06-10 DIAGNOSIS — Z5111 Encounter for antineoplastic chemotherapy: Secondary | ICD-10-CM | POA: Diagnosis not present

## 2020-06-10 MED ORDER — SODIUM CHLORIDE 0.9 % IV SOLN
1.0000 mg/m2 | Freq: Once | INTRAVENOUS | Status: AC
  Administered 2020-06-10: 1.4 mg via INTRAVENOUS
  Filled 2020-06-10: qty 1.4

## 2020-06-10 MED ORDER — SODIUM CHLORIDE 0.9% FLUSH
10.0000 mL | INTRAVENOUS | Status: DC | PRN
  Administered 2020-06-10: 10 mL

## 2020-06-10 MED ORDER — SODIUM CHLORIDE 0.9 % IV SOLN: Freq: Once | INTRAVENOUS | Status: AC

## 2020-06-10 MED ORDER — SODIUM CHLORIDE 0.9 % IV SOLN
Freq: Once | INTRAVENOUS | Status: AC
  Administered 2020-06-10: 8 mg via INTRAVENOUS
  Filled 2020-06-10: qty 4

## 2020-06-10 MED ORDER — HEPARIN SOD (PORK) LOCK FLUSH 100 UNIT/ML IV SOLN
500.0000 [IU] | Freq: Once | INTRAVENOUS | Status: AC | PRN
  Administered 2020-06-10: 500 [IU]

## 2020-06-10 NOTE — Progress Notes (Signed)
Patient presents for Day 5 Topotecan infusion.  Vital signs WNL.  No complaints at this time.  Treatment given today per MD orders.  Tolerated infusion without adverse affects.  Vital signs stable.  No complaints at this time.  Discharge from clinic via wheelchair in stable condition.  Alert and oriented X 3.  Follow up with [facility] as scheduled.

## 2020-06-10 NOTE — Patient Instructions (Signed)
[facility] Discharge Instructions for Patients Receiving Chemotherapy  Today you received the following chemotherapy agents   To help prevent nausea and vomiting after your treatment, we encourage you to take your nausea medication   If you develop nausea and vomiting that is not controlled by your nausea medication, call the clinic.   BELOW ARE SYMPTOMS THAT SHOULD BE REPORTED IMMEDIATELY:  *FEVER GREATER THAN 100.5 F  *CHILLS WITH OR WITHOUT FEVER  NAUSEA AND VOMITING THAT IS NOT CONTROLLED WITH YOUR NAUSEA MEDICATION  *UNUSUAL SHORTNESS OF BREATH  *UNUSUAL BRUISING OR BLEEDING  TENDERNESS IN MOUTH AND THROAT WITH OR WITHOUT PRESENCE OF ULCERS  *URINARY PROBLEMS  *BOWEL PROBLEMS  UNUSUAL RASH Items with * indicate a potential emergency and should be followed up as soon as possible.  Feel free to call the clinic should you have any questions or concerns. The clinic phone number is [phone].  Please show the CHEMO ALERT CARD at check-in to the Emergency Department and triage nurse.   

## 2020-06-13 ENCOUNTER — Ambulatory Visit
Admission: RE | Admit: 2020-06-13 | Discharge: 2020-06-13 | Disposition: A | Discharge status: Home / Self Care | Payer: Medicare Other | Source: Ambulatory Visit | Attending: Radiation Oncology | Admitting: Radiation Oncology

## 2020-06-13 ENCOUNTER — Other Ambulatory Visit: Payer: Self-pay | Admitting: Radiation Therapy

## 2020-06-13 ENCOUNTER — Other Ambulatory Visit: Payer: Self-pay

## 2020-06-13 VITALS — BP 133/85 | HR 74 | Temp 97.5°F | Resp 18

## 2020-06-13 DIAGNOSIS — C3411 Malignant neoplasm of upper lobe, right bronchus or lung: Secondary | ICD-10-CM | POA: Insufficient documentation

## 2020-06-13 DIAGNOSIS — C7931 Secondary malignant neoplasm of brain: Secondary | ICD-10-CM

## 2020-06-13 MED ORDER — HEPARIN SOD (PORK) LOCK FLUSH 100 UNIT/ML IV SOLN
500.0000 [IU] | Freq: Once | INTRAVENOUS | Status: AC
  Administered 2020-06-13: 500 [IU] via INTRAVENOUS

## 2020-06-13 MED ORDER — SODIUM CHLORIDE 0.9% FLUSH
10.0000 mL | Freq: Once | INTRAVENOUS | Status: AC
  Administered 2020-06-13: 10 mL via INTRAVENOUS

## 2020-06-13 MED ORDER — SODIUM CHLORIDE 0.9% FLUSH: 10.0000 mL | INTRAVENOUS | Status: DC | PRN

## 2020-06-13 MED ORDER — GADOBENATE DIMEGLUMINE 529 MG/ML IV SOLN
8.0000 mL | Freq: Once | INTRAVENOUS | Status: AC | PRN
  Administered 2020-06-13: 8 mL via INTRAVENOUS

## 2020-06-13 NOTE — Progress Notes (Signed)
Pt coming from the cancer center with port accessed for MRI IV contrast. Orders placed to flush and de-access after completion of the scan.    Mont Dutton R.T.(R)(T) Radiation Special Procedures Navigator

## 2020-06-13 NOTE — Addendum Note (Signed)
Encounter addended by: Zola Button, RN on: 06/13/2020 11:13 AM  Actions taken: MAR administration accepted

## 2020-06-13 NOTE — Progress Notes (Signed)
Has armband been applied?  Yes.    Does patient have an allergy to IV contrast dye?: No.   Has patient ever received premedication for IV contrast dye?: No.   Does patient take metformin?: No.  Date of lab work: June 06, 2020 BUN: 19 CR: 0.97  IV site: subclavian left, condition patent and no redness (will be deaccessed in imaging department after MRI this afternoon)  Has IV site been added to flowsheet?  Yes.    Vitals:   06/13/20 1050  BP: 133/85  Pulse: 74  Resp: 18  Temp: (!) 97.5 F (36.4 C)  SpO2: 99%

## 2020-06-15 DIAGNOSIS — C7931 Secondary malignant neoplasm of brain: Secondary | ICD-10-CM | POA: Insufficient documentation

## 2020-06-20 ENCOUNTER — Ambulatory Visit
Admission: RE | Admit: 2020-06-20 | Discharge: 2020-06-20 | Disposition: A | Discharge status: Home / Self Care | Payer: Medicare Other | Source: Ambulatory Visit | Attending: Radiation Oncology | Admitting: Radiation Oncology

## 2020-06-22 ENCOUNTER — Ambulatory Visit
Admission: RE | Admit: 2020-06-22 | Discharge: 2020-06-22 | Disposition: A | Discharge status: Home / Self Care | Payer: Medicare Other | Source: Ambulatory Visit | Attending: Radiation Oncology | Admitting: Radiation Oncology

## 2020-06-22 VITALS — BP 113/74 | HR 70 | Temp 96.7°F | Resp 18

## 2020-06-22 DIAGNOSIS — C7931 Secondary malignant neoplasm of brain: Secondary | ICD-10-CM

## 2020-06-22 NOTE — Progress Notes (Signed)
Nurse monitoring complete status post 1 of 3 SRS treatments. Patient without complaints. Patient denies new or worsening neurologic symptoms. Vitals stable. Instructed patient to avoid strenuous activity for the next 24 hours. Instructed patient to not miss any of her decadron doses. Instructed patient to call [phone] with needs related to treatment after hours or over the weekend. Patient verbalized understanding and agreement. Patient assisted out of clinic via wheelchair to daughter's vehicle without incident  Vitals:   06/22/20 1600  BP: 113/74  Pulse: 70  Resp: 18  Temp: (!) 96.7 F (35.9 C)  SpO2: 99%

## 2020-06-24 ENCOUNTER — Other Ambulatory Visit: Payer: Self-pay

## 2020-06-24 ENCOUNTER — Ambulatory Visit
Admission: RE | Admit: 2020-06-24 | Discharge: 2020-06-24 | Disposition: A | Discharge status: Home / Self Care | Payer: Medicare Other | Source: Ambulatory Visit | Attending: Radiation Oncology | Admitting: Radiation Oncology

## 2020-06-24 ENCOUNTER — Ambulatory Visit (HOSPITAL_COMMUNITY): Payer: Medicare Other

## 2020-06-24 VITALS — BP 100/68 | HR 82 | Temp 96.7°F | Resp 20

## 2020-06-24 DIAGNOSIS — C7931 Secondary malignant neoplasm of brain: Secondary | ICD-10-CM

## 2020-06-24 NOTE — Progress Notes (Signed)
  Radiation Oncology         [phone]) [phone] ________________________________  Name: Ashley [name] MRN: [id number]  Date: 06/22/2020  DOB: 1951/02/20  Stereotactic Treatment Procedure Note  SPECIAL TREATMENT PROCEDURE  Outpatient    ICD-10-CM   1. Brain metastasis ([place])  C79.31     3D TREATMENT PLANNING AND DOSIMETRY:  The patient's radiation plan was reviewed and approved by neurosurgery and radiation oncology prior to treatment.  It showed 3-dimensional radiation distributions overlaid onto the planning CT/MRI image set.  The [facility] for the target structures as well as the organs at risk were reviewed. The documentation of the 3D plan and dosimetry are filed in the radiation oncology EMR.  NARRATIVE:  Ashley [name] was brought to the TrueBeam stereotactic radiation treatment machine and placed supine on the CT couch. The head frame was applied, and the patient was set up for stereotactic radiosurgery.  Neurosurgery was present for the set-up and delivery  SIMULATION VERIFICATION:  In the couch zero-angle position, the patient underwent Exactrac imaging using the Brainlab system with orthogonal KV images.  These were carefully aligned and repeated to confirm treatment position for each of the isocenters.  The Exactrac snap film verification was repeated at each couch angle.  SPECIAL TREATMENT PROCEDURE: [name] received stereotactic radiosurgery to the following targets:   9Gy delivered to brain metastasis with SRS/IMRT technique using 6 MV FFF photons.  This constitutes a special treatment procedure due to the ablative dose delivered and the technical nature of treatment.  This highly technical modality of treatment ensures that the ablative dose is centered on the patient's tumor while sparing normal tissues from excessive dose and risk of detrimental effects.  STEREOTACTIC TREATMENT MANAGEMENT:  Following delivery, the patient was transported to nursing in stable condition and monitored for  possible acute effects.  Vital signs were recorded BP 113/74 (BP Location: Right Arm, Patient Position: Sitting)   Pulse 70   Temp (!) 96.7 F (35.9 C) (Temporal)   Resp 18   SpO2 99% . The patient tolerated treatment without significant acute effects, and was discharged to home in stable condition.    PLAN: Follow-up in 2-3 days for next fraction. ________________________________   Eppie Gibson, MD

## 2020-06-24 NOTE — Progress Notes (Signed)
  Radiation Oncology         (336) [phone] ________________________________  Name: Ashley [name] MRN: [id number]  Date: 06/22/2020  DOB: 10/28/1950  Stereotactic Treatment Procedure Note  SPECIAL TREATMENT PROCEDURE  Outpatient    ICD-10-CM   1. Brain metastasis ([place])  C79.31     3D TREATMENT PLANNING AND DOSIMETRY:  The patient's radiation plan was reviewed and approved by neurosurgery and radiation oncology prior to treatment.  It showed 3-dimensional radiation distributions overlaid onto the planning CT/MRI image set.  The [facility] for the target structures as well as the organs at risk were reviewed. The documentation of the 3D plan and dosimetry are filed in the radiation oncology EMR.  NARRATIVE:  Ashley [name] was brought to the TrueBeam stereotactic radiation treatment machine and placed supine on the CT couch. The head frame was applied, and the patient was set up for stereotactic radiosurgery.  Neurosurgery was present for the set-up and delivery  SIMULATION VERIFICATION:  In the couch zero-angle position, the patient underwent Exactrac imaging using the Brainlab system with orthogonal KV images.  These were carefully aligned and repeated to confirm treatment position for each of the isocenters.  The Exactrac snap film verification was repeated at each couch angle.  SPECIAL TREATMENT PROCEDURE: Ashley [name] received stereotactic radiosurgery to the following targets:   9Gy delivered to brain metastasis with SRS/IMRT technique using 6 MV FFF photons.  This constitutes a special treatment procedure due to the ablative dose delivered and the technical nature of treatment.  This highly technical modality of treatment ensures that the ablative dose is centered on the patient's tumor while sparing normal tissues from excessive dose and risk of detrimental effects.  STEREOTACTIC TREATMENT MANAGEMENT:  Following delivery, the patient was transported to nursing in stable condition and monitored for  possible acute effects.  Vital signs were recorded  Vitals:   06/24/20 1315  BP: 100/68  Pulse: 82  Resp: 20  Temp: (!) 96.7 F (35.9 C)  SpO2: 100%   She still has headaches that wake her at night and some nausea, both stable during SRS. She is taking Dexamethasone 2mg  QAM. Instructed to take 2mg  BID through the weekend.    The patient tolerated treatment without significant acute effects, and was discharged to home in stable condition.    PLAN: Follow-up in 2-3 days for next fraction. ________________________________   Eppie Gibson, MD

## 2020-06-24 NOTE — Progress Notes (Signed)
Nutrition Follow-up:  Patient with lung cancer.  Noted progression per MD note following Feb PET scan.  Patient receiving SRS radation and will complete this on 3/14.  Will start topotecan on 3/14.    Spoke with patient via phone. Patient reports that her appetite is good.  "Those pills are helping me."  "I use to not get hungry until about 3pm now I wake up hungry."  Reports having [place] biscuit this am. Trying to eat 3 meals per day and snacks between.  Denies nausea, some fatigue.      Medications: remeron and dexamethasone  Labs: reviewed  Anthropometrics:   Weight per chart is 94 lb 3.2 oz on 2/21.  Patient reports weighed on home scales this am and 102 lb 4 oz.    95 lb on 1/20   NUTRITION DIAGNOSIS: Inadequate oral intake improving   INTERVENTION:  Continue appetite stimulants  Encouraged patient to continue eating high calorie, high protein foods     MONITORING, EVALUATION, GOAL: weight trends, intake   NEXT VISIT: Monday, April 4 during infusion  Davon Abdelaziz B. Zenia Resides, [place], [place] Registered Dietitian [phone] (mobile)

## 2020-06-24 NOTE — Progress Notes (Signed)
Nurse monitoring complete status post 2 of 3 SRS treatments. Patient denies new or worsening neurologic symptoms. She does report nausea, blurry vision, and a mild headache (assessed in room by Dr. Isidore Moos to see if changes in steroid dose warranted). Vitals stable. Instructed patient to avoid strenuous activity for the next 24 hours. Instructed patient to not miss any of her decadron doses. Instructed patient to call [phone] with needs related to treatment after hours or over the weekend. Patient verbalized understanding. Assisted out of clinic via wheelchair with daughter's assistance without incident  Vitals:   06/24/20 1315  BP: 100/68  Pulse: 82  Resp: 20  Temp: (!) 96.7 F (35.9 C)  SpO2: 100%

## 2020-06-27 ENCOUNTER — Ambulatory Visit
Admission: RE | Admit: 2020-06-27 | Discharge: 2020-06-27 | Disposition: A | Discharge status: Home / Self Care | Payer: Medicare Other | Source: Ambulatory Visit | Attending: Radiation Oncology | Admitting: Radiation Oncology

## 2020-06-27 ENCOUNTER — Inpatient Hospital Stay (HOSPITAL_BASED_OUTPATIENT_CLINIC_OR_DEPARTMENT_OTHER): Payer: Medicare Other | Admitting: Hematology

## 2020-06-27 ENCOUNTER — Encounter: Payer: Self-pay | Admitting: Radiation Oncology

## 2020-06-27 ENCOUNTER — Encounter (HOSPITAL_COMMUNITY): Payer: Self-pay | Admitting: Hematology

## 2020-06-27 ENCOUNTER — Other Ambulatory Visit: Payer: Self-pay

## 2020-06-27 ENCOUNTER — Inpatient Hospital Stay (HOSPITAL_COMMUNITY): Payer: Medicare Other

## 2020-06-27 ENCOUNTER — Inpatient Hospital Stay (HOSPITAL_COMMUNITY): Payer: Medicare Other | Attending: Hematology

## 2020-06-27 ENCOUNTER — Other Ambulatory Visit: Payer: Self-pay | Admitting: Radiation Oncology

## 2020-06-27 ENCOUNTER — Other Ambulatory Visit (HOSPITAL_COMMUNITY): Payer: Self-pay

## 2020-06-27 VITALS — BP 114/73 | HR 75 | Temp 97.2°F | Resp 18 | Wt 99.4 lb

## 2020-06-27 VITALS — BP 119/69 | HR 84 | Temp 97.2°F | Resp 18

## 2020-06-27 VITALS — BP 104/69 | HR 72 | Temp 96.8°F | Resp 18

## 2020-06-27 DIAGNOSIS — C7931 Secondary malignant neoplasm of brain: Secondary | ICD-10-CM | POA: Diagnosis not present

## 2020-06-27 DIAGNOSIS — Z5111 Encounter for antineoplastic chemotherapy: Secondary | ICD-10-CM | POA: Insufficient documentation

## 2020-06-27 DIAGNOSIS — Z90710 Acquired absence of both cervix and uterus: Secondary | ICD-10-CM | POA: Insufficient documentation

## 2020-06-27 DIAGNOSIS — Z90722 Acquired absence of ovaries, bilateral: Secondary | ICD-10-CM | POA: Insufficient documentation

## 2020-06-27 DIAGNOSIS — Z79899 Other long term (current) drug therapy: Secondary | ICD-10-CM | POA: Insufficient documentation

## 2020-06-27 DIAGNOSIS — C3411 Malignant neoplasm of upper lobe, right bronchus or lung: Secondary | ICD-10-CM | POA: Insufficient documentation

## 2020-06-27 DIAGNOSIS — Z8249 Family history of ischemic heart disease and other diseases of the circulatory system: Secondary | ICD-10-CM | POA: Insufficient documentation

## 2020-06-27 DIAGNOSIS — C3491 Malignant neoplasm of unspecified part of right bronchus or lung: Secondary | ICD-10-CM

## 2020-06-27 DIAGNOSIS — Z85820 Personal history of malignant melanoma of skin: Secondary | ICD-10-CM | POA: Insufficient documentation

## 2020-06-27 DIAGNOSIS — Z87891 Personal history of nicotine dependence: Secondary | ICD-10-CM | POA: Insufficient documentation

## 2020-06-27 DIAGNOSIS — Z923 Personal history of irradiation: Secondary | ICD-10-CM | POA: Insufficient documentation

## 2020-06-27 DIAGNOSIS — Z902 Acquired absence of lung [part of]: Secondary | ICD-10-CM | POA: Insufficient documentation

## 2020-06-27 DIAGNOSIS — Z853 Personal history of malignant neoplasm of breast: Secondary | ICD-10-CM | POA: Insufficient documentation

## 2020-06-27 DIAGNOSIS — E876 Hypokalemia: Secondary | ICD-10-CM | POA: Insufficient documentation

## 2020-06-27 DIAGNOSIS — I10 Essential (primary) hypertension: Secondary | ICD-10-CM | POA: Insufficient documentation

## 2020-06-27 DIAGNOSIS — D539 Nutritional anemia, unspecified: Secondary | ICD-10-CM | POA: Insufficient documentation

## 2020-06-27 DIAGNOSIS — Z9012 Acquired absence of left breast and nipple: Secondary | ICD-10-CM | POA: Insufficient documentation

## 2020-06-27 DIAGNOSIS — M8580 Other specified disorders of bone density and structure, unspecified site: Secondary | ICD-10-CM | POA: Insufficient documentation

## 2020-06-27 DIAGNOSIS — Z801 Family history of malignant neoplasm of trachea, bronchus and lung: Secondary | ICD-10-CM | POA: Insufficient documentation

## 2020-06-27 DIAGNOSIS — Z7982 Long term (current) use of aspirin: Secondary | ICD-10-CM | POA: Insufficient documentation

## 2020-06-27 DIAGNOSIS — D473 Essential (hemorrhagic) thrombocythemia: Secondary | ICD-10-CM | POA: Insufficient documentation

## 2020-06-27 DIAGNOSIS — Z9079 Acquired absence of other genital organ(s): Secondary | ICD-10-CM | POA: Insufficient documentation

## 2020-06-27 DIAGNOSIS — Z9221 Personal history of antineoplastic chemotherapy: Secondary | ICD-10-CM | POA: Insufficient documentation

## 2020-06-27 LAB — CBC WITH DIFFERENTIAL/PLATELET
Abs Immature Granulocytes: 0.05 10*3/uL (ref 0.00–0.07)
Basophils Absolute: 0 10*3/uL (ref 0.0–0.1)
Basophils Relative: 1 %
Eosinophils Absolute: 0.2 10*3/uL (ref 0.0–0.5)
Eosinophils Relative: 3 %
HCT: 35.2 % — ABNORMAL LOW (ref 36.0–46.0)
Hemoglobin: 11.1 g/dL — ABNORMAL LOW (ref 12.0–15.0)
Immature Granulocytes: 1 %
Lymphocytes Relative: 19 %
Lymphs Abs: 1.3 10*3/uL (ref 0.7–4.0)
MCH: 28.8 pg (ref 26.0–34.0)
MCHC: 31.5 g/dL (ref 30.0–36.0)
MCV: 91.2 fL (ref 80.0–100.0)
Monocytes Absolute: 0.8 10*3/uL (ref 0.1–1.0)
Monocytes Relative: 11 %
Neutro Abs: 4.5 10*3/uL (ref 1.7–7.7)
Neutrophils Relative %: 65 %
Platelets: 697 10*3/uL — ABNORMAL HIGH (ref 150–400)
RBC: 3.86 MIL/uL — ABNORMAL LOW (ref 3.87–5.11)
RDW: 16.7 % — ABNORMAL HIGH (ref 11.5–15.5)
WBC: 6.9 10*3/uL (ref 4.0–10.5)
nRBC: 0 % (ref 0.0–0.2)

## 2020-06-27 LAB — COMPREHENSIVE METABOLIC PANEL
ALT: 19 U/L (ref 0–44)
AST: 22 U/L (ref 15–41)
Albumin: 3.5 g/dL (ref 3.5–5.0)
Alkaline Phosphatase: 59 U/L (ref 38–126)
Anion gap: 9 (ref 5–15)
BUN: 20 mg/dL (ref 8–23)
CO2: 25 mmol/L (ref 22–32)
Calcium: 8.7 mg/dL — ABNORMAL LOW (ref 8.9–10.3)
Chloride: 105 mmol/L (ref 98–111)
Creatinine, Ser: 0.92 mg/dL (ref 0.44–1.00)
GFR, Estimated: >60 mL/min (ref >60)
Glucose, Bld: 95 mg/dL (ref 70–99)
Potassium: 4 mmol/L (ref 3.5–5.1)
Sodium: 139 mmol/L (ref 135–145)
Total Bilirubin: 0.3 mg/dL (ref 0.3–1.2)
Total Protein: 6.8 g/dL (ref 6.5–8.1)

## 2020-06-27 LAB — MAGNESIUM: Magnesium: 1.9 mg/dL (ref 1.7–2.4)

## 2020-06-27 MED ORDER — HEPARIN SOD (PORK) LOCK FLUSH 100 UNIT/ML IV SOLN
500.0000 [IU] | Freq: Once | INTRAVENOUS | Status: AC
  Administered 2020-06-27: 500 [IU] via INTRAVENOUS

## 2020-06-27 MED ORDER — SODIUM CHLORIDE 0.9 % IV SOLN: Freq: Once | INTRAVENOUS | Status: AC

## 2020-06-27 MED ORDER — TOPOTECAN HCL CHEMO INJECTION 4 MG
1.2000 mg/m2 | Freq: Once | INTRAVENOUS | Status: AC
  Administered 2020-06-27: 1.7 mg via INTRAVENOUS
  Filled 2020-06-27: qty 1.7

## 2020-06-27 MED ORDER — SODIUM CHLORIDE 0.9% FLUSH
10.0000 mL | INTRAVENOUS | Status: DC | PRN
  Administered 2020-06-27: 10 mL

## 2020-06-27 MED ORDER — HEPARIN SOD (PORK) LOCK FLUSH 100 UNIT/ML IV SOLN
500.0000 [IU] | Freq: Once | INTRAVENOUS | Status: AC | PRN
  Administered 2020-06-27: 500 [IU]

## 2020-06-27 MED ORDER — SODIUM CHLORIDE 0.9 % IV SOLN
Freq: Once | INTRAVENOUS | Status: AC
  Administered 2020-06-27: 8 mg via INTRAVENOUS
  Filled 2020-06-27: qty 4

## 2020-06-27 NOTE — Patient Instructions (Signed)
[facility]  Discharge Instructions:   _______________________________________________________________  Thank you for choosing [facility] at [facility] to provide your oncology and hematology care.  To afford each patient quality time with our providers, please arrive at least 15 minutes before your scheduled appointment.  You need to re-schedule your appointment if you arrive 10 or more minutes late.  We strive to give you quality time with our providers, and arriving late affects you and other patients whose appointments are after yours.  Also, if you no show three or more times for appointments you may be dismissed from the clinic.  Again, thank you for choosing [facility]ospital. Our hope is that these requests will allow you access to exceptional care and in a timely manner. _______________________________________________________________  If you have questions after your visit, please contact our office at [phone] between the hours of 8:30 a.m. and 5:00 p.m. Voicemails left after 4:30 p.m. will not be returned until the following business day. _______________________________________________________________  For prescription refill requests, have your pharmacy contact our office. _______________________________________________________________  Recommendations made by the consultant and any test results will be sent to your referring physician. _______________________________________________________________ 

## 2020-06-27 NOTE — Progress Notes (Signed)
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 Right upper lobectomy on 12/30/2018. 2.Radiation to whole brain 25Gy in 10 fractions from 01/28/2019 through 02/10/2019. 3. Carboplatin and etoposide x 4 cycles from 03/09/2019 through 05/11/2019. 4. Lurbinectedin x 7 cycles from 12/15/2019 to 05/05/2020. 5. SBRT to brain metastases in 3 fractions from 06/22/2020 to 06/27/2020.  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 The 3 liver lesions are not hypermetabolic. The radiologist suspects they may be subtly present on prior CT chest from 10/2013, to further reassuring that these are likely benign lesions.   Melanoma ([place])  07/09/2013 Initial Biopsy   Initial biopsy L upper thigh/buttocks melanoma   07/20/2013 Surgery   Excision L lateral buttock/upper thigh melanoma, clear margins   07/20/2013 Pathology Results   Breslow depth 1.02 mm, pT2a    Small cell lung carcinoma, right ([place])  11/27/2018 Initial Diagnosis   Small cell lung carcinoma, right ([place])   03/09/2019 - 05/15/2019 Chemotherapy   The patient had palonosetron (ALOXI) injection 0.25 mg, 0.25 mg, Intravenous,  Once, 4 of 4 cycles Administration:  0.25 mg (03/09/2019), 0.25 mg (03/30/2019), 0.25 mg (04/20/2019), 0.25 mg (05/11/2019) pegfilgrastim (NEULASTA ONPRO KIT) injection 6 mg, 6 mg, Subcutaneous, Once, 1 of 1 cycle Administration: 6 mg (03/11/2019) pegfilgrastim-jmdb (FULPHILA) injection 6 mg, 6 mg, Subcutaneous,  Once, 3 of 3 cycles Administration: 6 mg (04/03/2019), 6 mg (04/24/2019), 6 mg (05/15/2019) CARBOplatin (PARAPLATIN) 330 mg in sodium chloride 0.9 % 250 mL chemo infusion, 330 mg (100 % of original dose 325.5 mg), Intravenous,  Once, 4 of 4 cycles Dose modification:   (original dose 325.5 mg, Cycle 1),   (original dose 325.5 mg, Cycle 2),   (original dose 309 mg, Cycle 3),   (original dose 325.5 mg, Cycle 4) Administration: 330 mg (03/09/2019), 330 mg (03/30/2019), 310 mg (04/20/2019), 330 mg (05/11/2019) etoposide (VEPESID) 150 mg in sodium chloride 0.9 % 500 mL chemo infusion, 100 mg/m2 = 150 mg, Intravenous,  Once, 4 of 4 cycles Administration: 150 mg (03/09/2019), 150 mg (03/10/2019), 150 mg (03/11/2019), 150 mg (03/30/2019), 150 mg (03/31/2019), 150 mg (04/01/2019), 150 mg (04/20/2019), 150 mg (04/21/2019), 150 mg (04/22/2019), 150 mg (05/11/2019), 150 mg (05/12/2019), 150 mg (05/13/2019) fosaprepitant (EMEND) 150 mg, dexamethasone (DECADRON) 12 mg in sodium chloride 0.9 % 145 mL IVPB, , Intravenous,  Once, 4 of 4 cycles Administration:  (03/09/2019),  (03/30/2019),  (04/20/2019),  (05/11/2019)  for chemotherapy treatment.    03/09/2019 Cancer Staging   Staging form: Lung, AJCC 8th Edition - Clinical: Stage IIIA (cT3, cN1, cM0) - Signed by Derek Jack, MD on 03/09/2019   12/15/2019 - 05/05/2020 Chemotherapy  06/06/2020 -  Chemotherapy    Patient is on Treatment Plan: LUNG SMALL CELL TOPOTECAN D1-5 Q21D        CANCER STAGING: Cancer Staging Small cell lung carcinoma, right (HCC) Staging form: Lung, AJCC 8th Edition - Clinical: Stage IIIA (cT3, cN1, cM0) - Signed by Derek Jack, MD on  03/09/2019   INTERVAL HISTORY:  [name], a 70 y.o. female, returns for routine follow-up and consideration for next cycle of chemotherapy. Tacia was last seen on 06/06/2020.  Due for cycle #2 of topotecan today.   Today she is accompanied by her daughter. Overall, she tells me she has been feeling okay. She tolerated the previous treatment well and notes that she had nausea, but it was well-controlled with Zofran. She is still feeling tired since she just finished radiation. Her appetite is staying stable. She complains of having intermittent chest wall pain starting retrosternally which spreads across the chest and radiates up to the jaw and back to the scapulae which becomes severe and then eases, which she attributes to radiation. She is taking Decadron 10 mg and is doing a taper for radiology.   Overall, she feels ready for next cycle of chemo today.    REVIEW OF SYSTEMS:  Review of Systems  Constitutional: Positive for appetite change (50%) and fatigue (50%).  Respiratory: Positive for shortness of breath (worsening).   Cardiovascular: Positive for chest pain (pain across chest up to sternum and to scapulae w/ movement).  Gastrointestinal: Positive for constipation (on Miralax) and nausea (post-Tx).  All other systems reviewed and are negative.   PAST MEDICAL/SURGICAL HISTORY:  Past Medical History:  Diagnosis Date  . 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 Anginal pain ([place])   . Arthritis   . Ascending aortic aneurysm ([place])   . Cancer [facility]) 1995   breast, left, mastectomy/chemo  . Chest pain    Possibly cardiac. No evidence of ischemia/injury based upon normal troponin I. Chest discomfort could be tachycardia induced supply demand mismatch.   . CHF (congestive heart failure) ([place]) 11/17/2015   after surgery   . Colon adenomas   . Coronary artery disease   . DJD (degenerative joint disease)   . Dyspnea    with exertion  . Emphysema of lung ([place]) 11/17/2015   . Essential hypertension, benign   . GERD (gastroesophageal reflux disease)   . History of hiatal hernia   . Hyperlipidemia   . Hypertension   . Melanoma ([place]) 01/09/2016  . Osteopenia   . Palpitations   . Pernicious anemia 03/06/2016  . Pernicious anemia   . Pure hypercholesterolemia   . Raynaud's disease   . Thrombocythemia, essential ([place]) 01/09/2016  . Thrombocytosis    Idiopathic  . Vitamin D deficiency    Past Surgical History:  Procedure Laterality Date  . ABDOMINAL HYSTERECTOMY    . ANTERIOR AND POSTERIOR REPAIR N/A 12/09/2014   Procedure: ANTERIOR (CYSTOCELE) AND POSTERIOR REPAIR (RECTOCELE);  Surgeon: Bjorn Loser, MD;  Location: [facility];  Service: Urology;  Laterality: N/A;  . ANTERIOR CERVICAL DECOMPRESSION/DISCECTOMY FUSION 4 LEVELS Right 10/03/2016   Procedure: ANTERIOR CERVICAL DECOMPRESSION FUSION, CERVICAL 4-5, CERVICAL 5-6, CERVICAL 6-7, CERVICAL 7 TO THORACIC 1 WITH INSTRUMENTATION AND ALLOGRAFT;  Surgeon: Phylliss Bob, MD;  Location: [facility];  Service: Orthopedics;  Laterality: Right;  ANTERIOR CERVICAL DECOMPRESSION FUSION, CERVICAL 4-5, CERVICAL 5-6, CERVICAL 6-7, CERVICAL 7 TO THORACIC 1 WITH INSTRUMENTATION AND ALLOGRAFT; REQUEST 4 HO  . ANTERIOR LAT LUMBAR FUSION Left 11/16/2015  Procedure: LEFT SIDED LATERAL INTERBODY FUSION, LUMBAR 2-3, LUMBAR 3-4, LUMBAR 4-5 WITH INSTRUMENTATION;  Surgeon: Phylliss Bob, MD;  Location: [facility];  Service: Orthopedics;  Laterality: Left;  LEFT SIDED LATEARL INTERBODY FUSION, LUMBAR 2-3, LUMBAR 3-4, LUMBAR 4-5 WITH INSTRUMENTATION   . APPENDECTOMY    . BACK SURGERY    . BONE MARROW ASPIRATION  07/2012  . BONE MARROW BIOPSY  07/2012  . BREAST SURGERY    . CARDIAC CATHETERIZATION    . CARDIAC CATHETERIZATION N/A 01/20/2016   Procedure: Left Heart Cath and Coronary Angiography;  Surgeon: Burnell Blanks, MD;  Location: [facility];  Service: Cardiovascular;  Laterality: N/A;  . COLONOSCOPY  11/29/2010   Procedure:  COLONOSCOPY;  Surgeon: Rogene Houston, MD;  [facility];  Service: Endoscopy;  Laterality: N/A;  . COLONOSCOPY N/A 02/18/2014   Procedure: COLONOSCOPY;  Surgeon: Rogene Houston, MD;  [facility];  Service: Endoscopy;  Laterality: N/A;  1030  . COLONOSCOPY N/A 02/25/2017   Procedure: COLONOSCOPY;  Surgeon: Rogene Houston, MD;  [facility];  Service: Endoscopy;  Laterality: N/A;  10:55  . CYSTOSCOPY N/A 12/09/2014   Procedure: CYSTOSCOPY;  Surgeon: Bjorn Loser, MD;  Location: [facility];  Service: Urology;  Laterality: N/A;  . ESOPHAGEAL DILATION N/A 02/25/2017   Procedure: ESOPHAGEAL DILATION;  Surgeon: Rogene Houston, MD;  [facility];  Service: Endoscopy;  Laterality: N/A;  . ESOPHAGOGASTRODUODENOSCOPY N/A 02/25/2017   Procedure: ESOPHAGOGASTRODUODENOSCOPY (EGD);  Surgeon: Rogene Houston, MD;  [facility];  Service: Endoscopy;  Laterality: N/A;  . IR GENERIC HISTORICAL  01/11/2016   IR RADIOLOGY PERIPHERAL GUIDED IV START 01/11/2016 Saverio Danker, PA-C [facility]  . IR GENERIC HISTORICAL  01/11/2016   IR US GUIDE VASC ACCESS RIGHT 01/11/2016 Saverio Danker, PA-C [facility]  . LYMPH NODE DISSECTION Right 12/30/2018   Procedure: Lymph Node Dissection;  Surgeon: Lajuana Matte, MD;  Location: [facility];  Service: Thoracic;  Laterality: Right;  . MASTECTOMY     left  . OVARIAN CYST SURGERY     x2  . POLYPECTOMY  02/25/2017   Procedure: POLYPECTOMY;  Surgeon: Rogene Houston, MD;  [facility];  Service: Endoscopy;;  . PORTACATH PLACEMENT Left 02/16/2019   Procedure: INSERTION PORT-A-CATH (CATHETER  LEFT SUBCLAVIAN);  Surgeon: Aviva Signs, MD;  Location: [facility];  Service: General;  Laterality: Left;  . SALPINGOOPHORECTOMY Bilateral 12/09/2014   Procedure: SALPINGO OOPHORECTOMY;  Surgeon: Servando Salina, MD;  Location: [facility];  Service: Gynecology;  Laterality: Bilateral;  . TUBAL LIGATION    . VAGINAL  HYSTERECTOMY N/A 12/09/2014   Procedure: HYSTERECTOMY VAGINAL;  Surgeon: Servando Salina, MD;  Location: [facility];  Service: Gynecology;  Laterality: N/A;  . VIDEO ASSISTED THORACOSCOPY (VATS)/ LOBECTOMY Right 12/30/2018   Procedure: VIDEO ASSISTED THORACOSCOPY (VATS)/RIGHT LOWER LOBE WEDGE RESECTION, RIGHT UPPER LOBECTOMY;  Surgeon: Lajuana Matte, MD;  Location: [facility];  Service: Thoracic;  Laterality: Right;  Marland Kitchen VIDEO BRONCHOSCOPY N/A 12/30/2018   Procedure: VIDEO BRONCHOSCOPY;  Surgeon: Lajuana Matte, MD;  Location: [facility];  Service: Thoracic;  Laterality: N/A;    SOCIAL HISTORY:  Social History   Socioeconomic History  . Marital status: Divorced    Spouse name: Not on file  . Number of children: 2  . Years of education: Not on file  . Highest education level: Not on file  Occupational History  . Not on file  Tobacco Use  .  Smoking status: Former Smoker    Packs/day: 0.50    Years: 50.00    Pack years: 25.00    Types: Cigarettes    Quit date: 11/25/2018    Years since quitting: 1.5  . Smokeless tobacco: Never Used  Vaping Use  . Vaping Use: Never used  Substance and Sexual Activity  . Alcohol use: Yes    Comment: occasional  . Drug use: No  . Sexual activity: Not Currently    Birth control/protection: Post-menopausal  Other Topics Concern  . Not on file  Social History Narrative  . Not on file   Social Determinants of Health   Financial Resource Strain: Low Risk   . Difficulty of Paying Living Expenses: Not hard at all  Food Insecurity: No Food Insecurity  . Worried About Charity fundraiser in the Last Year: Never true  . Ran Out of Food in the Last Year: Never true  Transportation Needs: No Transportation Needs  . Lack of Transportation (Medical): No  . Lack of Transportation (Non-Medical): No  Physical Activity: Insufficiently Active  . Days of Exercise per Week: 4 days  . Minutes of Exercise per Session: 30 min  Stress: No Stress Concern Present   . Feeling of Stress : Not at all  Social Connections: Moderately Isolated  . Frequency of Communication with Friends and Family: More than three times a week  . Frequency of Social Gatherings with Friends and Family: More than three times a week  . Attends Religious Services: More than 4 times per year  . Active Member of Clubs or Organizations: No  . Attends Archivist Meetings: Never  . Marital Status: Divorced  Human resources officer Violence: Not At Risk  . Fear of Current or Ex-Partner: No  . Emotionally Abused: No  . Physically Abused: No  . Sexually Abused: No    FAMILY HISTORY:  Family History  Problem Relation Age of Onset  . Hypertension Mother   . Heart failure Mother   . Congestive Heart Failure Mother   . COPD Mother   . Pernicious anemia Mother   . Cancer Mother        lung  . Hypertension Father   . CAD Father   . Heart attack Father   . Hypertension Sister   . Cancer Other   . Celiac disease Other     CURRENT MEDICATIONS:  Current Outpatient Medications  Medication Sig Dispense Refill  . albuterol (PROVENTIL HFA;VENTOLIN HFA) 108 (90 Base) MCG/ACT inhaler Inhale 2 puffs into the lungs every 6 (six) hours as needed for wheezing or shortness of breath. 1 Inhaler 2  . albuterol (PROVENTIL) (2.5 MG/3ML) 0.083% nebulizer solution Take 2.5 mg by nebulization every 6 (six) hours.    . ALPRAZolam (XANAX) 0.5 MG tablet Take one tablet prior to MRIs and/or radiation oncology procedures. 6 tablet 0  . Calcium Carb-Cholecalciferol 600-800 MG-UNIT TABS Take 1 tablet by mouth 2 (two) times daily.    . Cholecalciferol (VITAMIN D-3) 1000 units CAPS Take 1,000 Units daily by mouth.     . clonazePAM (KLONOPIN) 0.5 MG tablet TAKE ONE TABLET BY MOUTH AT BEDTIME. 90 tablet 4  . cyanocobalamin (,VITAMIN B-12,) 1000 MCG/ML injection INJECT 1 ML INTO THE MUSCLE ONCE MONTHLY AS DIRECTED. (Patient taking differently: Inject 1,000 mcg into the muscle every 30 (thirty) days.) 1 mL  5  . dexamethasone (DECADRON) 2 MG tablet Take 1 tablet (2 mg total) by mouth daily. 30 tablet 3  . diclofenac  Sodium (VOLTAREN) 1 % GEL Apply 2 grams to hands    . ergocalciferol (VITAMIN D2) 1.25 MG (50000 UT) capsule Take by mouth.    . furosemide (LASIX) 40 MG tablet 1/2 in Am and 1 tablet in PM    . gabapentin (NEURONTIN) 600 MG tablet Take 1 tablet (600 mg total) by mouth daily. 30 tablet 6  . guaiFENesin (MUCINEX) 600 MG 12 hr tablet Take 1 tablet (600 mg total) by mouth 2 (two) times daily as needed for cough or to loosen phlegm.    . ibandronate (BONIVA) 150 MG tablet Take 150 mg by mouth every 30 (thirty) days.     Marland Kitchen ibuprofen (ADVIL) 800 MG tablet Take 1 tablet (800 mg total) by mouth every 8 (eight) hours as needed for moderate pain. 30 tablet 6  . ipratropium (ATROVENT) 0.02 % nebulizer solution Take by nebulization.    Marland Kitchen ipratropium-albuterol (DUONEB) 0.5-2.5 (3) MG/3ML SOLN Take 3 mLs by nebulization every 6 (six) hours as needed (shortness of breath). 360 mL 0  . lidocaine-prilocaine (EMLA) cream Apply a small amount to port a cath site and cover with plastic wrap 1 hour prior to chemotherapy appointments 30 g 3  . magic mouthwash w/lidocaine SOLN 10 cc swab and swallow    . Meclizine HCl 25 MG CHEW 1 tablet    . meloxicam (MOBIC) 15 MG tablet 1 tablet    . methocarbamol (ROBAXIN) 500 MG tablet Take 500 mg by mouth every 6 (six) hours as needed for muscle spasms.     . metoCLOPramide (REGLAN) 5 MG tablet 1 tablet before meals    . mirtazapine (REMERON) 15 MG tablet TAKE (1) TABLET BY MOUTH AT BEDTIME. 30 tablet 0  . nitroGLYCERIN (NITRODUR - DOSED IN MG/24 HR) 0.1 mg/hr patch Place 0.1 mg onto the skin daily.    . ondansetron (ZOFRAN ODT) 8 MG disintegrating tablet Take 1 tablet (8 mg total) by mouth every 8 (eight) hours as needed for nausea or vomiting. 60 tablet 2  . oxyCODONE (OXY IR/ROXICODONE) 5 MG immediate release tablet Take 1 tablet (5 mg total) by mouth every 8 (eight)  hours. 90 tablet 0  . OXYGEN Inhale 3 L into the lungs daily.    . pantoprazole (PROTONIX) 40 MG tablet TAKE ONE TABLET BY MOUTH TWICE DAILY. 60 tablet 0  . polyethylene glycol (MIRALAX / GLYCOLAX) 17 g packet Take 17 g by mouth daily as needed for mild constipation.    . potassium chloride SA (KLOR-CON) 20 MEQ tablet Take 1 tablet (20 mEq total) by mouth 3 (three) times daily. (Patient taking differently: Take 20 mEq by mouth daily.) 60 tablet 2  . prochlorperazine (COMPAZINE) 10 MG tablet 1 tablet as needed    . rizatriptan (MAXALT) 10 MG tablet Take 10 mg by mouth 2 (two) times daily. 1-2 times daily May repeat in 2 hours if needed    . rosuvastatin (CRESTOR) 40 MG tablet Take 40 mg by mouth at bedtime.    . sucralfate (CARAFATE) 1 g tablet Take 1 g by mouth 3 (three) times daily.    . traMADol (ULTRAM) 50 MG tablet Take 50 mg by mouth every 6 (six) hours as needed.    . triamcinolone cream (KENALOG) 0.1 %     . aspirin EC 81 MG tablet Take 81 mg by mouth daily.     No current facility-administered medications for this visit.   Facility-Administered Medications Ordered in Other Visits  Medication Dose Route Frequency Provider  Last Rate Last Admin  . sodium chloride flush (NS) 0.9 % injection 10 mL  10 mL Intravenous PRN Lockamy, Randi L, NP-C   10 mL at 12/22/19 1240    ALLERGIES:  Allergies  Allergen Reactions  . Penicillins Hives and Rash    Did it involve swelling of the face/tongue/throat, SOB, or low BP? No Did it involve sudden or severe rash/hives, skin peeling, or any reaction on the inside of your mouth or nose? No Did you need to seek medical attention at a hospital or doctor's office? No When did it last happen?5-10 year If all above answers are "NO", may proceed with cephalosporin use.      PHYSICAL EXAM:  Performance status (ECOG): 1 - Symptomatic but completely ambulatory  Vitals:   06/27/20 1222  BP: 114/73  Pulse: 75  Resp: 18  Temp: (!) 97.2 F (36.2  C)   Wt Readings from Last 3 Encounters:  06/27/20 99 lb 6.4 oz (45.1 kg)  06/06/20 94 lb 3.2 oz (42.7 kg)  06/03/20 99 lb 2 oz (45 kg)   Physical Exam Vitals reviewed.  Constitutional:      Appearance: Normal appearance.  Cardiovascular:     Rate and Rhythm: Normal rate and regular rhythm.     Pulses: Normal pulses.     Heart sounds: Normal heart sounds.  Pulmonary:     Effort: Pulmonary effort is normal.     Breath sounds: Normal breath sounds.  Chest:     Comments: Port-a-Cath in L chest Musculoskeletal:     Right lower leg: No edema.     Left lower leg: No edema.  Neurological:     General: No focal deficit present.     Mental Status: She is alert and oriented to person, place, and time.  Psychiatric:        Mood and Affect: Mood normal.        Behavior: Behavior normal.     LABORATORY DATA:  I have reviewed the labs as listed.  CBC Latest Ref Rng & Units 06/27/2020 06/06/2020 05/31/2020  WBC 4.0 - 10.5 K/uL 6.9 11.3(H) 10.4  Hemoglobin 12.0 - 15.0 g/dL 11.1(L) 13.0 12.4  Hematocrit 36.0 - 46.0 % 35.2(L) 41.1 39.8  Platelets 150 - 400 K/uL 697(H) 533(H) 554(H)   CMP Latest Ref Rng & Units 06/27/2020 06/06/2020 05/31/2020  Glucose 70 - 99 mg/dL 95 171(H) 170(H)  BUN 8 - 23 mg/dL _0 Creatinine 0.44 - 1.00 mg/dL 0.92 0.97 0.89  Sodium 135 - 145 mmol/L 139 137 137  Potassium 3.5 - 5.1 mmol/L 4.0 3.5 3.3(L)  Chloride 98 - 111 mmol/L 105 101 104  CO2 22 - 32 mmol/L _1 Calcium 8.9 - 10.3 mg/dL 8.7(L) 10.0 9.6  Total Protein 6.5 - 8.1 g/dL 6.8 6.9 6.6  Total Bilirubin 0.3 - 1.2 mg/dL 0.3 0.6 0.3  Alkaline Phos 38 - 126 U/L 59 55 58  AST 15 - 41 U/L _2 ALT 0 - 44 U/L _3 DIAGNOSTIC IMAGING:  I have independently reviewed the scans and discussed with the patient. MR Brain W Wo Contrast  Result Date: 06/14/2020 CLINICAL DATA:  Treatment planning. Staging. Lung cancer with brain Mets. EXAM: MRI HEAD WITHOUT AND WITH CONTRAST TECHNIQUE:  Multiplanar, multiecho pulse sequences of the brain and surrounding structures were obtained without and with intravenous contrast. CONTRAST:  73m MULTIHANCE GADOBENATE DIMEGLUMINE 529 MG/ML IV SOLN COMPARISON:  May 24, 2020. FINDINGS: Brain: Similar versus minimally decreased size of the dominant enhancing lesion along the atrium of the right lateral ventricle, measuring 2.1 x 0.7 x 2.0 cm (previously 2.1 x 0.8 by 2.1 cm). Subependymal extension of enhancement along the right occipital horn may be minimally increased in bulk (series 11, image 62). Slightly increased surrounding T2/FLAIR hyperintense edema within the white matter. No substantial mass effect. No new enhancing lesions identified. Similar additional scattered T2/FLAIR hyperintensities within the white matter, most likely related to chronic microvascular ischemic disease. No acute infarct, acute hemorrhage, hydrocephalus, or extra-axial fluid collection. Vascular: Major arterial flow voids are maintained at the skull base. Skull and upper cervical spine: Similar partially imaged marrow signal change in the C3 vertebral body, possibly degenerative. Sinuses/Orbits: Visualized sinuses are clear.  Unremarkable orbits. Other: Right greater than left mastoid effusions. IMPRESSION: 1. Similar versus slightly decreased size of the dominant enhancing lesion along the atrium of the right lateral ventricle; however, subependymal extension of enhancement along the right occipital horn appears minimally increased in bulk with slightly increased surrounding edema. Recommend attention on follow up. 2. No new lesions identified. 3. Similar partially imaged marrow signal change in the C3 vertebral body, possibly degenerative. 4. Right greater than left mastoid effusions. Electronically Signed   By: Margaretha Sheffield MD   On: 06/14/2020 09:02     ASSESSMENT:  1.Extensive stage small cell lung cancer: -Right upper lobectomy on 12/30/2018, pathology-4.2 cm small  cell lung cancer, invading visceral pleura, 1/3 lymph nodes positive, LVI positive, metastatic carcinoma in 211 or lymph nodes, right chest wall biopsy consistent with small cell carcinoma. -30 Gy of radiation in 10 fractions from 01/28/2019 through 02/10/2019. -4 cycles of carboplatin and etoposide from 03/09/2019 through 05/11/2019. -PCI completed on 07/31/2019. -We reviewed MRI of the brain on 09/15/2019 with no evidence of brain meta stasis. Concern for hypointense appearance at the C3 vertebral body and left articular process. -CT chest on 09/15/2019 shows numerous new small solid irregular pulmonary nodules scattered throughout both lungs, largest 7 mm on the right lower lobe. Tiny loculated anterior right pleural effusion decreased. Stable mild subcarinal adenopathy. Bilateral stable adrenal adenomas. -MRI of the C-spine on 10/13/2019 showed marrow edema and enhancement involving C3 vertebral body and left articular process without discrete bone lesion. This was thought to be degenerative. -PET scan on 09/28/2019 shows bilateral lung nodules, below PET resolution. Small right-sided nodular densities demonstrating low-level hypermetabolism. Mild hypermetabolic corresponding to a normal-sized subcarinal lymph node. Multifocal right pleural hypermetabolism in the setting of pleural thickening and prior right upper lobectomy, indeterminate. No hypermetabolic extrathoracic disease. -PET scan on 11/24/2019 shows progressive increased soft tissue within the right lung along the suture margins, positive right paratracheal lymph node, small subcapsular focus of increased radiotracer uptake overlying the anterolateral right hepatic lobe concerning for tumor. -PET scan on 05/26/2020 with progression with bulky hypermetabolic subcarinal and right hilar lymphadenopathy. Hypermetabolic lymph nodes in the right paratracheal and precarinal stations.   PLAN:  1.Extensive stage small cell lung cancer: -She has  tolerated first cycle of topotecan reasonably well. -She had mild decrease in energy levels and mild nausea without any vomiting. -Reviewed her labs today which showed normal LFTs and creatinine.  White count and platelet count was normal. -Will increase topotecan to 1.2 mg/m2 during cycle 2 today.  We will plan to see her back in 3 weeks for follow-up.  We will plan to repeat scans after 3 cycles.  2. Essential thrombocytosis: -Hydrea on hold since  chemotherapy started.  Platelet count today 697.  3. Macrocytic anemia: -Hemoglobin today is 11.1 with MCV of 91.  4. Weight loss: -Continue dexamethasone in the mornings and Remeron daily.  5. Mid back and sternal pain: -Continue oxycodone 5 mg 1 tablet in the morning, 1 tablet at noon and 2 tablets at bedtime and alternate with tramadol.  6. Hypokalemia: -Continue potassium 20 mEq daily.  Potassium today is 4.  7. Brain metastasis: -She has completed 3/3 SRS treatments today. -She is on dexamethasone taper and is taking 10 mg today.   Orders placed this encounter:  No orders of the defined types were placed in this encounter.    Derek Jack, MD [place] [phone]   I, Milinda Antis, am acting as a scribe for Dr. Sanda Linger.  I, Derek Jack MD, have reviewed the above documentation for accuracy and completeness, and I agree with the above.

## 2020-06-27 NOTE — Progress Notes (Signed)
Patient tolerated chemotherapy with no complaints voiced.  Side effects with management reviewed with understanding verbalized.  Port site clean and dry with no bruising or swelling noted at site.  Good blood return noted before and after administration of chemotherapy.  Dressing intact.   Patient left in satisfactory condition with VSS and no s/s of distress noted.

## 2020-06-27 NOTE — Progress Notes (Signed)
Nurse monitoring complete status post 3 of 3 SRS treatments. Patient denies new or worsening neurologic symptoms. Vitals stable. Instructed patient to avoid strenuous activity for the next 24 hours. Dr. Isidore Moos assessed patient in exam room and provided steroid taper instructions. Instructed patient to call [phone] with needs related to treatment after hours or over the weekend. Patient verbalized understanding. Assisted out of clinic via wheelchair with daughter's assistance without incident  Vitals:   06/27/20 1020  BP: 104/69  Pulse: 72  Resp: 18  Temp: (!) 96.8 F (36 C)  SpO2: 99%

## 2020-06-27 NOTE — Progress Notes (Signed)
Patient was assessed by Dr. Delton Coombes and labs have been reviewed.  Patient is okay to proceed with treatment today dose increase 1.2 mg per meter square. Primary RN and pharmacy aware.

## 2020-06-27 NOTE — Progress Notes (Signed)
Patients port flushed without difficulty.  Good blood return noted with no bruising or swelling noted at site.  Stable during access and lab draw.

## 2020-06-27 NOTE — Progress Notes (Signed)
Patient presents today for Topotecan infusion.  Vital signs within parameters for treatment.  Labs pending.  Patient complains that she had had  intermitted chest pain since starting Topotecan treatment.  It goes away with rest usually in five minutes or less.  Patient has no other complains at this time.  Labs within parameters for treatment.  Message received from [place] patient okay for treatment.

## 2020-06-27 NOTE — Patient Instructions (Signed)
[place] at [facility] Discharge Instructions  You were seen today by Dr. Delton Coombes. He went over your recent results. You received your treatment today; continue getting your treatment daily for the rest of the week. Dr. Delton Coombes will see you back in 3 weeks for labs and follow up.   Thank you for choosing [place] at [facility] to provide your oncology and hematology care.  To afford each patient quality time with our provider, please arrive at least 15 minutes before your scheduled appointment time.   If you have a lab appointment with the [facility] please come in thru the Main Entrance and check in at the main information desk  You need to re-schedule your appointment should you arrive 10 or more minutes late.  We strive to give you quality time with our providers, and arriving late affects you and other patients whose appointments are after yours.  Also, if you no show three or more times for appointments you may be dismissed from the clinic at the providers discretion.     Again, thank you for choosing [facility].  Our hope is that these requests will decrease the amount of time that you wait before being seen by our physicians.       _____________________________________________________________  Should you have questions after your visit to [facility], please contact our office at [phone] between the hours of 8:00 a.m. and 4:30 p.m.  Voicemails left after 4:00 p.m. will not be returned until the following business day.  For prescription refill requests, have your pharmacy contact our office and allow 72 hours.    Cancer Center Support Programs:   > Cancer Support Group  2nd Tuesday of the month 1pm-2pm, Journey Room

## 2020-06-28 ENCOUNTER — Inpatient Hospital Stay (HOSPITAL_COMMUNITY): Payer: Medicare Other

## 2020-06-28 ENCOUNTER — Other Ambulatory Visit (HOSPITAL_COMMUNITY): Payer: Self-pay | Admitting: *Deleted

## 2020-06-28 VITALS — BP 95/60 | HR 98 | Temp 97.3°F | Resp 18

## 2020-06-28 DIAGNOSIS — Z923 Personal history of irradiation: Secondary | ICD-10-CM | POA: Diagnosis not present

## 2020-06-28 DIAGNOSIS — C7931 Secondary malignant neoplasm of brain: Secondary | ICD-10-CM | POA: Diagnosis not present

## 2020-06-28 DIAGNOSIS — M8580 Other specified disorders of bone density and structure, unspecified site: Secondary | ICD-10-CM | POA: Diagnosis not present

## 2020-06-28 DIAGNOSIS — Z90722 Acquired absence of ovaries, bilateral: Secondary | ICD-10-CM | POA: Insufficient documentation

## 2020-06-28 DIAGNOSIS — C3411 Malignant neoplasm of upper lobe, right bronchus or lung: Secondary | ICD-10-CM | POA: Diagnosis present

## 2020-06-28 DIAGNOSIS — Z5111 Encounter for antineoplastic chemotherapy: Secondary | ICD-10-CM | POA: Insufficient documentation

## 2020-06-28 DIAGNOSIS — Z9221 Personal history of antineoplastic chemotherapy: Secondary | ICD-10-CM | POA: Insufficient documentation

## 2020-06-28 DIAGNOSIS — D473 Essential (hemorrhagic) thrombocythemia: Secondary | ICD-10-CM | POA: Diagnosis not present

## 2020-06-28 DIAGNOSIS — Z8249 Family history of ischemic heart disease and other diseases of the circulatory system: Secondary | ICD-10-CM | POA: Diagnosis not present

## 2020-06-28 DIAGNOSIS — Z90710 Acquired absence of both cervix and uterus: Secondary | ICD-10-CM | POA: Diagnosis not present

## 2020-06-28 DIAGNOSIS — Z902 Acquired absence of lung [part of]: Secondary | ICD-10-CM | POA: Diagnosis not present

## 2020-06-28 DIAGNOSIS — Z853 Personal history of malignant neoplasm of breast: Secondary | ICD-10-CM | POA: Diagnosis not present

## 2020-06-28 DIAGNOSIS — Z87891 Personal history of nicotine dependence: Secondary | ICD-10-CM | POA: Insufficient documentation

## 2020-06-28 DIAGNOSIS — Z85820 Personal history of malignant melanoma of skin: Secondary | ICD-10-CM | POA: Insufficient documentation

## 2020-06-28 DIAGNOSIS — Z79899 Other long term (current) drug therapy: Secondary | ICD-10-CM | POA: Diagnosis not present

## 2020-06-28 DIAGNOSIS — I10 Essential (primary) hypertension: Secondary | ICD-10-CM | POA: Insufficient documentation

## 2020-06-28 DIAGNOSIS — C3491 Malignant neoplasm of unspecified part of right bronchus or lung: Secondary | ICD-10-CM

## 2020-06-28 DIAGNOSIS — Z7982 Long term (current) use of aspirin: Secondary | ICD-10-CM | POA: Diagnosis not present

## 2020-06-28 DIAGNOSIS — D539 Nutritional anemia, unspecified: Secondary | ICD-10-CM | POA: Insufficient documentation

## 2020-06-28 DIAGNOSIS — Z801 Family history of malignant neoplasm of trachea, bronchus and lung: Secondary | ICD-10-CM | POA: Diagnosis not present

## 2020-06-28 DIAGNOSIS — Z9079 Acquired absence of other genital organ(s): Secondary | ICD-10-CM | POA: Diagnosis not present

## 2020-06-28 DIAGNOSIS — Z9012 Acquired absence of left breast and nipple: Secondary | ICD-10-CM | POA: Diagnosis not present

## 2020-06-28 DIAGNOSIS — E876 Hypokalemia: Secondary | ICD-10-CM | POA: Diagnosis not present

## 2020-06-28 MED ORDER — SODIUM CHLORIDE 0.9 % IV SOLN: Freq: Once | INTRAVENOUS | Status: AC

## 2020-06-28 MED ORDER — TOPOTECAN HCL CHEMO INJECTION 4 MG
1.2000 mg/m2 | Freq: Once | INTRAVENOUS | Status: AC
  Administered 2020-06-28: 1.7 mg via INTRAVENOUS
  Filled 2020-06-28: qty 1.7

## 2020-06-28 MED ORDER — HEPARIN SOD (PORK) LOCK FLUSH 100 UNIT/ML IV SOLN
500.0000 [IU] | Freq: Once | INTRAVENOUS | Status: AC | PRN
Start: 2020-06-28 — End: 2020-06-28
  Administered 2020-06-28: 500 [IU]

## 2020-06-28 MED ORDER — SODIUM CHLORIDE 0.9% FLUSH
10.0000 mL | INTRAVENOUS | Status: DC | PRN
  Administered 2020-06-28: 10 mL

## 2020-06-28 MED ORDER — SODIUM CHLORIDE 0.9 % IV SOLN
Freq: Once | INTRAVENOUS | Status: AC
  Administered 2020-06-28: 8 mg via INTRAVENOUS
  Filled 2020-06-28: qty 4

## 2020-06-28 NOTE — Patient Instructions (Signed)
Topotecan infusion What is this medicine? TOPOTECAN (TOE poe TEE kan) is a chemotherapy drug. It is used to treat lung cancer, ovarian cancer, and cervical cancer. This medicine may be used for other purposes; ask your health care provider or pharmacist if you have questions. COMMON BRAND NAME(S): Hycamtin What should I tell my health care provider before I take this medicine? They need to know if you have any of these conditions:  immune system problems  infection (especially a virus infection such as chickenpox, cold sores, or herpes)  kidney disease  low blood counts, like low white cell, platelet, or red cell counts  lung or breathing disease, like asthma  scarring or thickening of the lungs  an unusual or allergic reaction to topotecan, other medicines, foods, dyes, or preservatives  pregnant or trying to get pregnant  breast-feeding How should I use this medicine? This medicine is for infusion into a vein. It is usually given by a health care professional in a hospital or clinic setting. Talk to your pediatrician regarding the use of this medicine in children. Special care may be needed. Overdosage: If you think you have taken too much of this medicine contact a poison control center or emergency room at once. NOTE: This medicine is only for you. Do not share this medicine with others. What if I miss a dose? It is important not to miss your dose. Call your doctor or health care professional if you are unable to keep an appointment. What may interact with this medicine?  amiodarone  azithromycin  captopril  carvedilol  certain medications for fungal infections like ketoconazole and itraconazole  clarithromycin  conivaptan  cyclosporine  dronedarone  eltrombopag  erythromycin  felodipine  grapefruit juice  lopinavir  quercetin  quinidine  ranolazine  ritonavir  ticagrelor  verapamil This list may not describe all possible interactions. Give  your health care provider a list of all the medicines, herbs, non-prescription drugs, or dietary supplements you use. Also tell them if you smoke, drink alcohol, or use illegal drugs. Some items may interact with your medicine. What should I watch for while using this medicine? This drug may make you feel generally unwell. This is not uncommon, as chemotherapy can affect healthy cells as well as cancer cells. Report any side effects. Continue your course of treatment even though you feel ill unless your doctor tells you to stop. Call your doctor or health care professional for advice if you get a fever, chills or sore throat, or other symptoms of a cold or flu. Do not treat yourself. This drug decreases your body's ability to fight infections. Try to avoid being around people who are sick. This medicine may increase your risk to bruise or bleed. Call your doctor or health care professional if you notice any unusual bleeding. Be careful brushing and flossing your teeth or using a toothpick because you may get an infection or bleed more easily. If you have any dental work done, tell your dentist you are receiving this medicine. Avoid taking products that contain aspirin, acetaminophen, ibuprofen, naproxen, or ketoprofen unless instructed by your doctor. These medicines may hide a fever. Do not become pregnant while taking this medicine or for 6 months after stopping it. Women should inform their doctor if they wish to become pregnant or think they might be pregnant. Men should not father a child while taking this medicine and for 3 months after stopping it. There is a potential for serious side effects to an unborn child.  Talk to your health care professional or pharmacist for more information. Do not breast-feed an infant while taking this medicine. In females, this medicine may make it more difficult to get pregnant. This medicine has also caused reduced sperm counts in some men. This may interfere with the  ability to father a child. You should talk to your doctor or health care professional if you are concerned about your fertility. What side effects may I notice from receiving this medicine? Side effects that you should report to your doctor or health care professional as soon as possible:  allergic reactions like skin rash, itching or hives, swelling of the face, lips, or tongue  breathing difficulties  diarrhea  signs of decreased platelets or bleeding - bruising, pinpoint red spots on the skin, black, tarry stools, blood in the urine  signs of decreased red blood cells - unusually weak or tired, feeling faint or lightheaded, falls  signs and symptoms of infection like fever or chills; cough; sore throat; pain or trouble passing urine Side effects that usually do not require medical attention (report to your doctor or health care professional if they continue or are bothersome):  hair loss  headache  loss of appetite  nausea, vomiting  stomach pain This list may not describe all possible side effects. Call your doctor for medical advice about side effects. You may report side effects to FDA at 1-800-FDA-1088. Where should I keep my medicine? Keep out of the reach of children. This drug is usually given in a hospital or clinic and will not be stored at home. In rare cases, this medicine may be given at home. If you are using this medicine at home, you will be instructed on how to store this medicine. Throw away any unused medicine after the expiration date on the label. NOTE: This sheet is a summary. It may not cover all possible information. If you have questions about this medicine, talk to your doctor, pharmacist, or health care provider.  2021 Elsevier/Gold Standard (2016-12-27 11:35:12)

## 2020-06-28 NOTE — Progress Notes (Signed)
Patient presents for day two Topotecan infusion.  Vital signs within parameters for treatment.  Patient has no new complaints since last visit.  Topotecan infusion given today per MD orders.  Stable during infusion without adverse affects.  Vital signs stable.  No complaints at this time.  Discharge from clinic via wheelchair in stable condition.  Alert and oriented X 3.  Follow up with [facility] as scheduled.

## 2020-06-29 ENCOUNTER — Inpatient Hospital Stay (HOSPITAL_COMMUNITY): Payer: Medicare Other

## 2020-06-29 ENCOUNTER — Other Ambulatory Visit: Payer: Self-pay

## 2020-06-29 VITALS — BP 103/62 | HR 77 | Temp 97.0°F | Resp 18

## 2020-06-29 DIAGNOSIS — Z5111 Encounter for antineoplastic chemotherapy: Secondary | ICD-10-CM | POA: Diagnosis not present

## 2020-06-29 DIAGNOSIS — C3491 Malignant neoplasm of unspecified part of right bronchus or lung: Secondary | ICD-10-CM

## 2020-06-29 MED ORDER — HEPARIN SOD (PORK) LOCK FLUSH 100 UNIT/ML IV SOLN: 500.0000 [IU] | Freq: Once | INTRAVENOUS | Status: DC | PRN

## 2020-06-29 MED ORDER — SODIUM CHLORIDE 0.9 % IV SOLN
1.2000 mg/m2 | Freq: Once | INTRAVENOUS | Status: AC
  Administered 2020-06-29: 1.7 mg via INTRAVENOUS
  Filled 2020-06-29: qty 1.7

## 2020-06-29 MED ORDER — SODIUM CHLORIDE 0.9% FLUSH: 10.0000 mL | INTRAVENOUS | Status: DC | PRN

## 2020-06-29 MED ORDER — DEXAMETHASONE 2 MG PO TABS: 2.0000 mg | ORAL_TABLET | Freq: Every day | ORAL | 3 refills | Status: DC

## 2020-06-29 MED ORDER — ONDANSETRON HCL 4 MG/2ML IJ SOLN
8.0000 mg | Freq: Once | INTRAMUSCULAR | Status: AC
  Administered 2020-06-29: 8 mg via INTRAVENOUS
  Filled 2020-06-29: qty 4

## 2020-06-29 MED ORDER — SODIUM CHLORIDE 0.9 % IV SOLN: Freq: Once | INTRAVENOUS | Status: DC

## 2020-06-29 MED ORDER — SODIUM CHLORIDE 0.9 % IV SOLN: Freq: Once | INTRAVENOUS | Status: AC

## 2020-06-29 NOTE — Progress Notes (Addendum)
  Radiation Oncology         [phone] ________________________________  Name: Ashley [name] MRN: [id number]  Date: 06/27/2020  DOB: 07-02-1950  Stereotactic Treatment Procedure Note  SPECIAL TREATMENT PROCEDURE  Outpatient    ICD-10-CM   1. Brain metastasis ([place])  C79.31     3D TREATMENT PLANNING AND DOSIMETRY:  The patient's radiation plan was reviewed and approved by neurosurgery and radiation oncology prior to treatment.  It showed 3-dimensional radiation distributions overlaid onto the planning CT/MRI image set.  [facility] for the target structures as well as the organs at risk were reviewed. The documentation of the 3D plan and dosimetry are filed in the radiation oncology EMR.  NARRATIVE:  Ashley [name] was brought to the TrueBeam stereotactic radiation treatment machine and placed supine on the CT couch. The head frame was applied, and the patient was set up for stereotactic radiosurgery.  Neurosurgery was present for the set-up and delivery  SIMULATION VERIFICATION:  In the couch zero-angle position, the patient underwent Exactrac imaging using the Brainlab system with orthogonal KV images.  These were carefully aligned and repeated to confirm treatment position for each of the isocenters.  The Exactrac snap film verification was repeated at each couch angle.  SPECIAL TREATMENT PROCEDURE: Ashley [name] received stereotactic radiosurgery to the following targets:   9Gy delivered to brain metastasis with SRS/IMRT technique using 6 MV FFF photons.  This constitutes a special treatment procedure due to the ablative dose delivered and the technical nature of treatment.  This highly technical modality of treatment ensures that the ablative dose is centered on the patient's tumor while sparing normal tissues from excessive dose and risk of detrimental effects.  STEREOTACTIC TREATMENT MANAGEMENT:  Following delivery, the patient was transported to nursing in stable condition and monitored for  possible acute effects.  Vital signs were recorded  Vitals:   06/27/20 1020  BP: 104/69  Pulse: 72  Resp: 18  Temp: (!) 96.8 F (36 C)  SpO2: 99%   The patient tolerated treatment without significant acute effects, and was discharged to home in stable condition.    PLAN: Follow-up in 1 month She still has significant headaches despite taking 2mg  dexamethasone twice a day.  She was given instructions on how to escalate her dexamethasone dose temporarily, as well as a taper plan.  See below:  ________________________________   Eppie Gibson, MD

## 2020-06-29 NOTE — Patient Instructions (Signed)
Topotecan infusion What is this medicine? TOPOTECAN (TOE poe TEE kan) is a chemotherapy drug. It is used to treat lung cancer, ovarian cancer, and cervical cancer. This medicine may be used for other purposes; ask your health care provider or pharmacist if you have questions. COMMON BRAND NAME(S): Hycamtin What should I tell my health care provider before I take this medicine? They need to know if you have any of these conditions:  immune system problems  infection (especially a virus infection such as chickenpox, cold sores, or herpes)  kidney disease  low blood counts, like low white cell, platelet, or red cell counts  lung or breathing disease, like asthma  scarring or thickening of the lungs  an unusual or allergic reaction to topotecan, other medicines, foods, dyes, or preservatives  pregnant or trying to get pregnant  breast-feeding How should I use this medicine? This medicine is for infusion into a vein. It is usually given by a health care professional in a hospital or clinic setting. Talk to your pediatrician regarding the use of this medicine in children. Special care may be needed. Overdosage: If you think you have taken too much of this medicine contact a poison control center or emergency room at once. NOTE: This medicine is only for you. Do not share this medicine with others. What if I miss a dose? It is important not to miss your dose. Call your doctor or health care professional if you are unable to keep an appointment. What may interact with this medicine?  amiodarone  azithromycin  captopril  carvedilol  certain medications for fungal infections like ketoconazole and itraconazole  clarithromycin  conivaptan  cyclosporine  dronedarone  eltrombopag  erythromycin  felodipine  grapefruit juice  lopinavir  quercetin  quinidine  ranolazine  ritonavir  ticagrelor  verapamil This list may not describe all possible interactions. Give  your health care provider a list of all the medicines, herbs, non-prescription drugs, or dietary supplements you use. Also tell them if you smoke, drink alcohol, or use illegal drugs. Some items may interact with your medicine. What should I watch for while using this medicine? This drug may make you feel generally unwell. This is not uncommon, as chemotherapy can affect healthy cells as well as cancer cells. Report any side effects. Continue your course of treatment even though you feel ill unless your doctor tells you to stop. Call your doctor or health care professional for advice if you get a fever, chills or sore throat, or other symptoms of a cold or flu. Do not treat yourself. This drug decreases your body's ability to fight infections. Try to avoid being around people who are sick. This medicine may increase your risk to bruise or bleed. Call your doctor or health care professional if you notice any unusual bleeding. Be careful brushing and flossing your teeth or using a toothpick because you may get an infection or bleed more easily. If you have any dental work done, tell your dentist you are receiving this medicine. Avoid taking products that contain aspirin, acetaminophen, ibuprofen, naproxen, or ketoprofen unless instructed by your doctor. These medicines may hide a fever. Do not become pregnant while taking this medicine or for 6 months after stopping it. Women should inform their doctor if they wish to become pregnant or think they might be pregnant. Men should not father a child while taking this medicine and for 3 months after stopping it. There is a potential for serious side effects to an unborn child.  Talk to your health care professional or pharmacist for more information. Do not breast-feed an infant while taking this medicine. In females, this medicine may make it more difficult to get pregnant. This medicine has also caused reduced sperm counts in some men. This may interfere with the  ability to father a child. You should talk to your doctor or health care professional if you are concerned about your fertility. What side effects may I notice from receiving this medicine? Side effects that you should report to your doctor or health care professional as soon as possible:  allergic reactions like skin rash, itching or hives, swelling of the face, lips, or tongue  breathing difficulties  diarrhea  signs of decreased platelets or bleeding - bruising, pinpoint red spots on the skin, black, tarry stools, blood in the urine  signs of decreased red blood cells - unusually weak or tired, feeling faint or lightheaded, falls  signs and symptoms of infection like fever or chills; cough; sore throat; pain or trouble passing urine Side effects that usually do not require medical attention (report to your doctor or health care professional if they continue or are bothersome):  hair loss  headache  loss of appetite  nausea, vomiting  stomach pain This list may not describe all possible side effects. Call your doctor for medical advice about side effects. You may report side effects to FDA at 1-800-FDA-1088. Where should I keep my medicine? Keep out of the reach of children. This drug is usually given in a hospital or clinic and will not be stored at home. In rare cases, this medicine may be given at home. If you are using this medicine at home, you will be instructed on how to store this medicine. Throw away any unused medicine after the expiration date on the label. NOTE: This sheet is a summary. It may not cover all possible information. If you have questions about this medicine, talk to your doctor, pharmacist, or health care provider.  2021 Elsevier/Gold Standard (2016-12-27 11:35:12)

## 2020-06-29 NOTE — Progress Notes (Signed)
Patient presents today for Day 3 Topotecan.  Vital signs within parameters for treatment.  Patient has no new complaints since last visit.  Topotecan infusion given today per MD orders.  Stable during infusion without adverse affects.  Vital signs stable.  No complaints at this time.  Discharge from clinic via wheelchair in stable condition.  Alert and oriented X 3.  Follow up with [facility] as scheduled.

## 2020-06-30 ENCOUNTER — Inpatient Hospital Stay (HOSPITAL_COMMUNITY): Payer: Medicare Other

## 2020-06-30 VITALS — BP 93/56 | HR 74 | Temp 96.9°F | Resp 18

## 2020-06-30 DIAGNOSIS — Z5111 Encounter for antineoplastic chemotherapy: Secondary | ICD-10-CM | POA: Diagnosis not present

## 2020-06-30 DIAGNOSIS — C3491 Malignant neoplasm of unspecified part of right bronchus or lung: Secondary | ICD-10-CM

## 2020-06-30 MED ORDER — SODIUM CHLORIDE 0.9 % IV SOLN: Freq: Once | INTRAVENOUS | Status: AC

## 2020-06-30 MED ORDER — SODIUM CHLORIDE 0.9 % IV SOLN
Freq: Once | INTRAVENOUS | Status: AC
  Administered 2020-06-30: 8 mg via INTRAVENOUS
  Filled 2020-06-30: qty 4

## 2020-06-30 MED ORDER — HEPARIN SOD (PORK) LOCK FLUSH 100 UNIT/ML IV SOLN
500.0000 [IU] | Freq: Once | INTRAVENOUS | Status: AC | PRN
Start: 2020-06-30 — End: 2020-06-30
  Administered 2020-06-30: 500 [IU]

## 2020-06-30 MED ORDER — SODIUM CHLORIDE 0.9% FLUSH
10.0000 mL | INTRAVENOUS | Status: DC | PRN
  Administered 2020-06-30: 10 mL

## 2020-06-30 MED ORDER — TOPOTECAN HCL CHEMO INJECTION 4 MG
1.2000 mg/m2 | Freq: Once | INTRAVENOUS | Status: AC
  Administered 2020-06-30: 1.7 mg via INTRAVENOUS
  Filled 2020-06-30: qty 1.7

## 2020-06-30 NOTE — Progress Notes (Signed)
Patient presents for Day 4 Topotecan.  Vital signs within parameters for treatment.  No new complaints since last visit.  Topotecan infusion given today per MD orders.  Stable during infusion without adverse affects.  Vital signs stable.  No complaints at this time.  Discharge from clinic via wheelchair in stable condition.  Alert and oriented X 3.  Follow up with [facility] as scheduled.

## 2020-06-30 NOTE — Patient Instructions (Signed)
Topotecan infusion What is this medicine? TOPOTECAN (TOE poe TEE kan) is a chemotherapy drug. It is used to treat lung cancer, ovarian cancer, and cervical cancer. This medicine may be used for other purposes; ask your health care provider or pharmacist if you have questions. COMMON BRAND NAME(S): Hycamtin What should I tell my health care provider before I take this medicine? They need to know if you have any of these conditions:  immune system problems  infection (especially a virus infection such as chickenpox, cold sores, or herpes)  kidney disease  low blood counts, like low white cell, platelet, or red cell counts  lung or breathing disease, like asthma  scarring or thickening of the lungs  an unusual or allergic reaction to topotecan, other medicines, foods, dyes, or preservatives  pregnant or trying to get pregnant  breast-feeding How should I use this medicine? This medicine is for infusion into a vein. It is usually given by a health care professional in a hospital or clinic setting. Talk to your pediatrician regarding the use of this medicine in children. Special care may be needed. Overdosage: If you think you have taken too much of this medicine contact a poison control center or emergency room at once. NOTE: This medicine is only for you. Do not share this medicine with others. What if I miss a dose? It is important not to miss your dose. Call your doctor or health care professional if you are unable to keep an appointment. What may interact with this medicine?  amiodarone  azithromycin  captopril  carvedilol  certain medications for fungal infections like ketoconazole and itraconazole  clarithromycin  conivaptan  cyclosporine  dronedarone  eltrombopag  erythromycin  felodipine  grapefruit juice  lopinavir  quercetin  quinidine  ranolazine  ritonavir  ticagrelor  verapamil This list may not describe all possible interactions. Give  your health care provider a list of all the medicines, herbs, non-prescription drugs, or dietary supplements you use. Also tell them if you smoke, drink alcohol, or use illegal drugs. Some items may interact with your medicine. What should I watch for while using this medicine? This drug may make you feel generally unwell. This is not uncommon, as chemotherapy can affect healthy cells as well as cancer cells. Report any side effects. Continue your course of treatment even though you feel ill unless your doctor tells you to stop. Call your doctor or health care professional for advice if you get a fever, chills or sore throat, or other symptoms of a cold or flu. Do not treat yourself. This drug decreases your body's ability to fight infections. Try to avoid being around people who are sick. This medicine may increase your risk to bruise or bleed. Call your doctor or health care professional if you notice any unusual bleeding. Be careful brushing and flossing your teeth or using a toothpick because you may get an infection or bleed more easily. If you have any dental work done, tell your dentist you are receiving this medicine. Avoid taking products that contain aspirin, acetaminophen, ibuprofen, naproxen, or ketoprofen unless instructed by your doctor. These medicines may hide a fever. Do not become pregnant while taking this medicine or for 6 months after stopping it. Women should inform their doctor if they wish to become pregnant or think they might be pregnant. Men should not father a child while taking this medicine and for 3 months after stopping it. There is a potential for serious side effects to an unborn child.  Talk to your health care professional or pharmacist for more information. Do not breast-feed an infant while taking this medicine. In females, this medicine may make it more difficult to get pregnant. This medicine has also caused reduced sperm counts in some men. This may interfere with the  ability to father a child. You should talk to your doctor or health care professional if you are concerned about your fertility. What side effects may I notice from receiving this medicine? Side effects that you should report to your doctor or health care professional as soon as possible:  allergic reactions like skin rash, itching or hives, swelling of the face, lips, or tongue  breathing difficulties  diarrhea  signs of decreased platelets or bleeding - bruising, pinpoint red spots on the skin, black, tarry stools, blood in the urine  signs of decreased red blood cells - unusually weak or tired, feeling faint or lightheaded, falls  signs and symptoms of infection like fever or chills; cough; sore throat; pain or trouble passing urine Side effects that usually do not require medical attention (report to your doctor or health care professional if they continue or are bothersome):  hair loss  headache  loss of appetite  nausea, vomiting  stomach pain This list may not describe all possible side effects. Call your doctor for medical advice about side effects. You may report side effects to FDA at 1-800-FDA-1088. Where should I keep my medicine? Keep out of the reach of children. This drug is usually given in a hospital or clinic and will not be stored at home. In rare cases, this medicine may be given at home. If you are using this medicine at home, you will be instructed on how to store this medicine. Throw away any unused medicine after the expiration date on the label. NOTE: This sheet is a summary. It may not cover all possible information. If you have questions about this medicine, talk to your doctor, pharmacist, or health care provider.  2021 Elsevier/Gold Standard (2016-12-27 11:35:12)

## 2020-07-01 ENCOUNTER — Inpatient Hospital Stay (HOSPITAL_COMMUNITY): Payer: Medicare Other

## 2020-07-01 ENCOUNTER — Other Ambulatory Visit: Payer: Self-pay

## 2020-07-01 VITALS — BP 106/60 | HR 70 | Temp 97.0°F | Resp 18

## 2020-07-01 DIAGNOSIS — C3491 Malignant neoplasm of unspecified part of right bronchus or lung: Secondary | ICD-10-CM

## 2020-07-01 DIAGNOSIS — Z5111 Encounter for antineoplastic chemotherapy: Secondary | ICD-10-CM | POA: Diagnosis not present

## 2020-07-01 MED ORDER — TOPOTECAN HCL CHEMO INJECTION 4 MG
1.2000 mg/m2 | Freq: Once | INTRAVENOUS | Status: AC
  Administered 2020-07-01: 1.7 mg via INTRAVENOUS
  Filled 2020-07-01: qty 1.7

## 2020-07-01 MED ORDER — SODIUM CHLORIDE 0.9 % IV SOLN
Freq: Once | INTRAVENOUS | Status: AC
  Administered 2020-07-01: 8 mg via INTRAVENOUS
  Filled 2020-07-01: qty 4

## 2020-07-01 MED ORDER — HEPARIN SOD (PORK) LOCK FLUSH 100 UNIT/ML IV SOLN
500.0000 [IU] | Freq: Once | INTRAVENOUS | Status: AC | PRN
  Administered 2020-07-01: 500 [IU]

## 2020-07-01 MED ORDER — SODIUM CHLORIDE 0.9% FLUSH
10.0000 mL | Freq: Once | INTRAVENOUS | Status: AC
  Administered 2020-07-01: 10 mL via INTRAVENOUS

## 2020-07-01 MED ORDER — SODIUM CHLORIDE 0.9 % IV SOLN: Freq: Once | INTRAVENOUS | Status: AC

## 2020-07-01 NOTE — Progress Notes (Signed)
Patient presents for Day 5 Topotecan.  Vital signs within parameters for treatment.  No new complaints since last visit.  Topotecan infusion given today per MD orders.  Stable during infusion without adverse affects.  Vital signs stable.  No complaints at this time.  Discharge from clinic via wheelchair in stable condition.  Alert and oriented X 3.  Follow up with [facility] as scheduled.

## 2020-07-01 NOTE — Patient Instructions (Signed)
[facility] Discharge Instructions for Patients Receiving Chemotherapy  Today you received the following chemotherapy agents   To help prevent nausea and vomiting after your treatment, we encourage you to take your nausea medication   If you develop nausea and vomiting that is not controlled by your nausea medication, call the clinic.   BELOW ARE SYMPTOMS THAT SHOULD BE REPORTED IMMEDIATELY:  *FEVER GREATER THAN 100.5 F  *CHILLS WITH OR WITHOUT FEVER  NAUSEA AND VOMITING THAT IS NOT CONTROLLED WITH YOUR NAUSEA MEDICATION  *UNUSUAL SHORTNESS OF BREATH  *UNUSUAL BRUISING OR BLEEDING  TENDERNESS IN MOUTH AND THROAT WITH OR WITHOUT PRESENCE OF ULCERS  *URINARY PROBLEMS  *BOWEL PROBLEMS  UNUSUAL RASH Items with * indicate a potential emergency and should be followed up as soon as possible.  Feel free to call the clinic should you have any questions or concerns. The clinic phone number is [phone].  Please show the CHEMO ALERT CARD at check-in to the Emergency Department and triage nurse.   

## 2020-07-12 ENCOUNTER — Other Ambulatory Visit (HOSPITAL_COMMUNITY): Payer: Self-pay

## 2020-07-12 DIAGNOSIS — C3491 Malignant neoplasm of unspecified part of right bronchus or lung: Secondary | ICD-10-CM

## 2020-07-12 MED ORDER — DEXAMETHASONE 2 MG PO TABS: 2.0000 mg | ORAL_TABLET | Freq: Every day | ORAL | 3 refills | Status: DC

## 2020-07-18 ENCOUNTER — Inpatient Hospital Stay (HOSPITAL_COMMUNITY): Payer: Medicare Other | Attending: Hematology | Admitting: Dietician

## 2020-07-18 ENCOUNTER — Inpatient Hospital Stay (HOSPITAL_COMMUNITY): Payer: Medicare Other

## 2020-07-18 ENCOUNTER — Inpatient Hospital Stay (HOSPITAL_BASED_OUTPATIENT_CLINIC_OR_DEPARTMENT_OTHER): Payer: Medicare Other | Admitting: Hematology

## 2020-07-18 ENCOUNTER — Encounter (HOSPITAL_COMMUNITY): Payer: Self-pay | Admitting: Hematology

## 2020-07-18 ENCOUNTER — Other Ambulatory Visit (HOSPITAL_COMMUNITY): Payer: Self-pay | Admitting: Surgery

## 2020-07-18 ENCOUNTER — Other Ambulatory Visit: Payer: Self-pay

## 2020-07-18 VITALS — BP 126/79 | HR 74 | Temp 96.8°F | Resp 16 | Wt 103.6 lb

## 2020-07-18 VITALS — BP 127/65 | HR 71

## 2020-07-18 DIAGNOSIS — Z902 Acquired absence of lung [part of]: Secondary | ICD-10-CM | POA: Insufficient documentation

## 2020-07-18 DIAGNOSIS — Z853 Personal history of malignant neoplasm of breast: Secondary | ICD-10-CM | POA: Diagnosis not present

## 2020-07-18 DIAGNOSIS — C3491 Malignant neoplasm of unspecified part of right bronchus or lung: Secondary | ICD-10-CM

## 2020-07-18 DIAGNOSIS — Z87891 Personal history of nicotine dependence: Secondary | ICD-10-CM | POA: Insufficient documentation

## 2020-07-18 DIAGNOSIS — Z5111 Encounter for antineoplastic chemotherapy: Secondary | ICD-10-CM | POA: Insufficient documentation

## 2020-07-18 DIAGNOSIS — C7989 Secondary malignant neoplasm of other specified sites: Secondary | ICD-10-CM | POA: Insufficient documentation

## 2020-07-18 DIAGNOSIS — D649 Anemia, unspecified: Secondary | ICD-10-CM | POA: Diagnosis not present

## 2020-07-18 DIAGNOSIS — Z85820 Personal history of malignant melanoma of skin: Secondary | ICD-10-CM | POA: Insufficient documentation

## 2020-07-18 DIAGNOSIS — Z7982 Long term (current) use of aspirin: Secondary | ICD-10-CM | POA: Diagnosis not present

## 2020-07-18 DIAGNOSIS — G893 Neoplasm related pain (acute) (chronic): Secondary | ICD-10-CM | POA: Insufficient documentation

## 2020-07-18 DIAGNOSIS — Z9012 Acquired absence of left breast and nipple: Secondary | ICD-10-CM | POA: Insufficient documentation

## 2020-07-18 DIAGNOSIS — D473 Essential (hemorrhagic) thrombocythemia: Secondary | ICD-10-CM | POA: Diagnosis not present

## 2020-07-18 DIAGNOSIS — C7931 Secondary malignant neoplasm of brain: Secondary | ICD-10-CM | POA: Insufficient documentation

## 2020-07-18 DIAGNOSIS — E876 Hypokalemia: Secondary | ICD-10-CM | POA: Diagnosis not present

## 2020-07-18 DIAGNOSIS — Z79899 Other long term (current) drug therapy: Secondary | ICD-10-CM | POA: Insufficient documentation

## 2020-07-18 DIAGNOSIS — Z90710 Acquired absence of both cervix and uterus: Secondary | ICD-10-CM | POA: Diagnosis not present

## 2020-07-18 DIAGNOSIS — C3411 Malignant neoplasm of upper lobe, right bronchus or lung: Secondary | ICD-10-CM | POA: Insufficient documentation

## 2020-07-18 LAB — COMPREHENSIVE METABOLIC PANEL
ALT: 19 U/L (ref 0–44)
AST: 24 U/L (ref 15–41)
Albumin: 3.4 g/dL — ABNORMAL LOW (ref 3.5–5.0)
Alkaline Phosphatase: 46 U/L (ref 38–126)
Anion gap: 10 (ref 5–15)
BUN: 22 mg/dL (ref 8–23)
CO2: 30 mmol/L (ref 22–32)
Calcium: 8.1 mg/dL — ABNORMAL LOW (ref 8.9–10.3)
Chloride: 97 mmol/L — ABNORMAL LOW (ref 98–111)
Creatinine, Ser: 1.3 mg/dL — ABNORMAL HIGH (ref 0.44–1.00)
GFR, Estimated: 44 mL/min — ABNORMAL LOW (ref >60)
Glucose, Bld: 151 mg/dL — ABNORMAL HIGH (ref 70–99)
Potassium: 2.8 mmol/L — ABNORMAL LOW (ref 3.5–5.1)
Sodium: 137 mmol/L (ref 135–145)
Total Bilirubin: 0.5 mg/dL (ref 0.3–1.2)
Total Protein: 6.3 g/dL — ABNORMAL LOW (ref 6.5–8.1)

## 2020-07-18 LAB — CBC WITH DIFFERENTIAL/PLATELET
Abs Immature Granulocytes: 0.22 10*3/uL — ABNORMAL HIGH (ref 0.00–0.07)
Basophils Absolute: 0 10*3/uL (ref 0.0–0.1)
Basophils Relative: 0 %
Eosinophils Absolute: 0.1 10*3/uL (ref 0.0–0.5)
Eosinophils Relative: 1 %
HCT: 32.4 % — ABNORMAL LOW (ref 36.0–46.0)
Hemoglobin: 10.1 g/dL — ABNORMAL LOW (ref 12.0–15.0)
Immature Granulocytes: 2 %
Lymphocytes Relative: 12 %
Lymphs Abs: 1.2 10*3/uL (ref 0.7–4.0)
MCH: 28.9 pg (ref 26.0–34.0)
MCHC: 31.2 g/dL (ref 30.0–36.0)
MCV: 92.8 fL (ref 80.0–100.0)
Monocytes Absolute: 0.9 10*3/uL (ref 0.1–1.0)
Monocytes Relative: 9 %
Neutro Abs: 7.6 10*3/uL (ref 1.7–7.7)
Neutrophils Relative %: 76 %
Platelets: 474 10*3/uL — ABNORMAL HIGH (ref 150–400)
RBC: 3.49 MIL/uL — ABNORMAL LOW (ref 3.87–5.11)
RDW: 18.3 % — ABNORMAL HIGH (ref 11.5–15.5)
WBC: 10 10*3/uL (ref 4.0–10.5)
nRBC: 0.3 % — ABNORMAL HIGH (ref 0.0–0.2)

## 2020-07-18 LAB — MAGNESIUM: Magnesium: 2.8 mg/dL — ABNORMAL HIGH (ref 1.7–2.4)

## 2020-07-18 MED ORDER — DEXAMETHASONE 2 MG PO TABS: 2.0000 mg | ORAL_TABLET | Freq: Every day | ORAL | 3 refills | Status: AC

## 2020-07-18 MED ORDER — ONDANSETRON HCL 40 MG/20ML IJ SOLN
Freq: Once | INTRAMUSCULAR | Status: AC
  Administered 2020-07-18: 8 mg via INTRAVENOUS
  Filled 2020-07-18: qty 4

## 2020-07-18 MED ORDER — SODIUM CHLORIDE 0.9 % IV SOLN: INTRAVENOUS | Status: DC

## 2020-07-18 MED ORDER — OXYCODONE HCL 5 MG PO TABS: 5.0000 mg | ORAL_TABLET | Freq: Three times a day (TID) | ORAL | 0 refills | Status: DC

## 2020-07-18 MED ORDER — POTASSIUM CHLORIDE CRYS ER 20 MEQ PO TBCR
40.0000 meq | EXTENDED_RELEASE_TABLET | Freq: Once | ORAL | Status: AC
  Administered 2020-07-18: 40 meq via ORAL
  Filled 2020-07-18: qty 2

## 2020-07-18 MED ORDER — SODIUM CHLORIDE 0.9% FLUSH
10.0000 mL | INTRAVENOUS | Status: DC | PRN
Start: 2020-07-18 — End: 2020-07-18
  Administered 2020-07-18: 10 mL

## 2020-07-18 MED ORDER — TOPOTECAN HCL CHEMO INJECTION 4 MG
1.2000 mg/m2 | Freq: Once | INTRAVENOUS | Status: AC
  Administered 2020-07-18: 1.7 mg via INTRAVENOUS
  Filled 2020-07-18: qty 1.7

## 2020-07-18 MED ORDER — SODIUM CHLORIDE 0.9 % IV SOLN
Freq: Once | INTRAVENOUS | Status: AC
Start: 2020-07-18 — End: 2020-07-18

## 2020-07-18 MED ORDER — MAGIC MOUTHWASH W/LIDOCAINE: ORAL | 1 refills | Status: DC

## 2020-07-18 MED ORDER — POTASSIUM CHLORIDE 10 MEQ/100ML IV SOLN
10.0000 meq | INTRAVENOUS | Status: AC
  Administered 2020-07-18 (×2): 10 meq via INTRAVENOUS
  Filled 2020-07-18 (×2): qty 100

## 2020-07-18 MED ORDER — ONDANSETRON 8 MG PO TBDP: 8.0000 mg | ORAL_TABLET | Freq: Three times a day (TID) | ORAL | 2 refills | Status: DC | PRN

## 2020-07-18 MED ORDER — HEPARIN SOD (PORK) LOCK FLUSH 100 UNIT/ML IV SOLN
500.0000 [IU] | Freq: Once | INTRAVENOUS | Status: AC | PRN
  Administered 2020-07-18: 500 [IU]

## 2020-07-18 NOTE — Progress Notes (Signed)
Ashley [name] presents today for D1C3 Topotecan. Pt denies any new changes or symptoms since last treatment. Lab results and vitals have been reviewed and are stable and within parameters for treatment. Patient has been assessed by Dr. Delton Coombes who has approved proceeding with treatment today as planned. Orders also received to infuse 500cc NS bolus for Cr 1.3 and 37mEq PO K and 82mEq IV K for K of 2.8.  Infusions tolerated without incident or complaint. VSS upon completion of treatment. Port flushed and left accessed per protocol, see MAR and IV flowsheet for details. Discharged in satisfactory condition with follow up instructions.

## 2020-07-18 NOTE — Progress Notes (Signed)
Patient was assessed by Dr. Delton Coombes and labs have been reviewed.  Patient is okay to proceed with treatment today. Dr. Delton Coombes is ordering 512ml normal saline and Potassium 20 IV and 40 meq by mouth today. Primary RN and pharmacy aware.

## 2020-07-18 NOTE — Patient Instructions (Signed)
[facility] Discharge Instructions for Patients Receiving Chemotherapy   Beginning January 23rd 2017 lab work for the [facility] will be done in the  Main lab at [facility] on 1st floor. If you have a lab appointment with the [facility] please come in thru the  Main Entrance and check in at the main information desk   Today you received the following chemotherapy agents Topotecan  To help prevent nausea and vomiting after your treatment, we encourage you to take your nausea medication    If you develop nausea and vomiting, or diarrhea that is not controlled by your medication, call the clinic.  The clinic phone number is [phone]. Office hours are Monday-Friday 8:30am-5:00pm.  BELOW ARE SYMPTOMS THAT SHOULD BE REPORTED IMMEDIATELY:  *FEVER GREATER THAN 101.0 F  *CHILLS WITH OR WITHOUT FEVER  NAUSEA AND VOMITING THAT IS NOT CONTROLLED WITH YOUR NAUSEA MEDICATION  *UNUSUAL SHORTNESS OF BREATH  *UNUSUAL BRUISING OR BLEEDING  TENDERNESS IN MOUTH AND THROAT WITH OR WITHOUT PRESENCE OF ULCERS  *URINARY PROBLEMS  *BOWEL PROBLEMS  UNUSUAL RASH Items with * indicate a potential emergency and should be followed up as soon as possible. If you have an emergency after office hours please contact your primary care physician or go to the nearest emergency department.  Please call the clinic during office hours if you have any questions or concerns.   You may also contact the Patient Navigator at [phone] should you have any questions or need assistance in obtaining follow up care.      Resources For Cancer Patients and their Caregivers ? American Cancer Society: Can assist with transportation, wigs, general needs, runs Look Good Feel Better.        8077310650 ? Cancer Care: Provides financial assistance, online support groups, medication/co-pay assistance.  1-800-813-HOPE 315-253-8874) ? [facility] Assists [place]  cancer patients and their families through emotional , educational and financial support.  714-190-7842 ? [place] DSS Where to apply for food stamps, Medicaid and utility assistance. 904-835-5706 ? [facility]: Transportation to medical appointments. 514-498-8288 ? Social Security Administration: May apply for disability if have a Stage IV cancer. 347 112 6126 435-201-5351 ? [employer], Disability and Transit Services: Assists with nutrition, care and transit needs. 718-674-4426

## 2020-07-18 NOTE — Progress Notes (Signed)
Ashley [name], [place] [address]   CLINIC:  Medical Oncology/Hematology  PCP:  [name], NP [address] / [place] [address] [phone]   REASON FOR VISIT:  Follow-up for right small cell lung cancer  PRIOR THERAPY:  1. Right upper lobectomy on 12/30/2018. 2.Radiation to whole brain 25Gy in 10 fractions from 01/28/2019 through 02/10/2019. 3. Carboplatin and etoposide x 4 cycles from 03/09/2019 through 05/11/2019. 4. Lurbinectedin x 7 cycles from 12/15/2019 to 05/05/2020. 5. SBRT to brain metastases in 3 fractions from 06/22/2020 to 06/27/2020.  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 The 3 liver lesions are not hypermetabolic. The radiologist suspects they may be subtly present on prior CT chest from 10/2013, to further reassuring that these are likely benign lesions.   Melanoma ([place])  07/09/2013 Initial Biopsy   Initial biopsy L upper thigh/buttocks melanoma   07/20/2013 Surgery   Excision L lateral buttock/upper thigh melanoma, clear margins   07/20/2013 Pathology Results   Breslow depth 1.02 mm, pT2a    Small cell lung carcinoma, right ([place])  11/27/2018 Initial Diagnosis   Small cell lung carcinoma, right ([place])   03/09/2019 - 05/15/2019 Chemotherapy   The patient had palonosetron (ALOXI) injection 0.25 mg, 0.25 mg, Intravenous,  Once, 4 of 4 cycles Administration:  0.25 mg (03/09/2019), 0.25 mg (03/30/2019), 0.25 mg (04/20/2019), 0.25 mg (05/11/2019) pegfilgrastim (NEULASTA ONPRO KIT) injection 6 mg, 6 mg, Subcutaneous, Once, 1 of 1 cycle Administration: 6 mg (03/11/2019) pegfilgrastim-jmdb (FULPHILA) injection 6 mg, 6 mg, Subcutaneous,  Once, 3 of 3 cycles Administration: 6 mg (04/03/2019), 6 mg (04/24/2019), 6 mg (05/15/2019) CARBOplatin (PARAPLATIN) 330 mg in sodium chloride 0.9 % 250 mL chemo infusion, 330 mg (100 % of original dose 325.5 mg), Intravenous,  Once, 4 of 4 cycles Dose modification:   (original dose 325.5 mg, Cycle 1),   (original dose 325.5 mg, Cycle 2),   (original dose 309 mg, Cycle 3),   (original dose 325.5 mg, Cycle 4) Administration: 330 mg (03/09/2019), 330 mg (03/30/2019), 310 mg (04/20/2019), 330 mg (05/11/2019) etoposide (VEPESID) 150 mg in sodium chloride 0.9 % 500 mL chemo infusion, 100 mg/m2 = 150 mg, Intravenous,  Once, 4 of 4 cycles Administration: 150 mg (03/09/2019), 150 mg (03/10/2019), 150 mg (03/11/2019), 150 mg (03/30/2019), 150 mg (03/31/2019), 150 mg (04/01/2019), 150 mg (04/20/2019), 150 mg (04/21/2019), 150 mg (04/22/2019), 150 mg (05/11/2019), 150 mg (05/12/2019), 150 mg (05/13/2019) fosaprepitant (EMEND) 150 mg, dexamethasone (DECADRON) 12 mg in sodium chloride 0.9 % 145 mL IVPB, , Intravenous,  Once, 4 of 4 cycles Administration:  (03/09/2019),  (03/30/2019),  (04/20/2019),  (05/11/2019)  for chemotherapy treatment.    03/09/2019 Cancer Staging   Staging form: Lung, AJCC 8th Edition - Clinical: Stage IIIA (cT3, cN1, cM0) - Signed by Derek Jack, MD on 03/09/2019   12/15/2019 - 05/05/2020 Chemotherapy  06/06/2020 -  Chemotherapy    Patient is on Treatment Plan: LUNG SMALL CELL TOPOTECAN D1-5 Q21D        CANCER STAGING: Cancer Staging Small cell lung carcinoma, right (HCC) Staging form: Lung, AJCC 8th Edition - Clinical: Stage IIIA (cT3, cN1, cM0) - Signed by Derek Jack, MD on  03/09/2019   INTERVAL HISTORY:  Ashley [name], a 70 y.o. female, returns for routine follow-up and consideration for next cycle of chemotherapy. Ashley [name] was last seen on 06/27/2020.  Due for cycle #3 of topotecan today.   Today she is accompanied by her daughter. Overall, she tells me she has been feeling okay. She tolerated the previous treatment well and notes having fatigue for 1 week following chemo. She is taking Zofran for nausea but is not throwing up. She stopped taking potassium because her mouth and tongue have been raw. She is not taking any diuretics and she is drinking plenty of water. Her hair has started falling out again. She denies having any new pains.  Overall, she feels ready for next cycle of chemo today.    REVIEW OF SYSTEMS:  Review of Systems  Constitutional: Positive for appetite change (25%) and fatigue (50%).  HENT:   Positive for sore throat (raw tongue).   Respiratory: Positive for shortness of breath (w/ exertion).   Cardiovascular: Positive for chest pain.  Gastrointestinal: Positive for nausea (on Zofran). Negative for vomiting.  Musculoskeletal: Positive for back pain (3/10 constant mid back pain). Negative for arthralgias.  Neurological: Positive for headaches and numbness (hands & feet).  Psychiatric/Behavioral: Positive for sleep disturbance.  All other systems reviewed and are negative.   PAST MEDICAL/SURGICAL HISTORY:  Past Medical History:  Diagnosis Date  . 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 Anginal pain ([place])   . Arthritis   . Ascending aortic aneurysm ([place])   . Cancer [facility]) 1995   breast, left, mastectomy/chemo  . Chest pain    Possibly cardiac. No evidence of ischemia/injury based upon normal troponin I. Chest discomfort could be tachycardia induced supply demand mismatch.   . CHF (congestive heart failure) ([place]) 11/17/2015   after surgery   . Colon adenomas   . Coronary artery disease   . DJD (degenerative joint  disease)   . Dyspnea    with exertion  . Emphysema of lung ([place]) 11/17/2015  . Essential hypertension, benign   . GERD (gastroesophageal reflux disease)   . History of hiatal hernia   . Hyperlipidemia   . Hypertension   . Melanoma ([place]) 01/09/2016  . Osteopenia   . Palpitations   . Pernicious anemia 03/06/2016  . Pernicious anemia   . Pure hypercholesterolemia   . Raynaud's disease   . Thrombocythemia, essential ([place]) 01/09/2016  . Thrombocytosis    Idiopathic  . Vitamin D deficiency    Past Surgical History:  Procedure Laterality Date  . ABDOMINAL HYSTERECTOMY    . ANTERIOR AND POSTERIOR REPAIR N/A 12/09/2014   Procedure: ANTERIOR (CYSTOCELE) AND POSTERIOR REPAIR (RECTOCELE);  Surgeon: Bjorn Loser, MD;  Location: [facility];  Service: Urology;  Laterality: N/A;  . ANTERIOR CERVICAL DECOMPRESSION/DISCECTOMY FUSION 4 LEVELS Right 10/03/2016   Procedure: ANTERIOR CERVICAL DECOMPRESSION FUSION, CERVICAL 4-5, CERVICAL 5-6, CERVICAL 6-7, CERVICAL 7 TO THORACIC 1 WITH INSTRUMENTATION AND ALLOGRAFT;  Surgeon: Phylliss Bob, MD;  Location: [facility];  Service: Orthopedics;  Laterality: Right;  ANTERIOR CERVICAL DECOMPRESSION FUSION, CERVICAL 4-5, CERVICAL 5-6, CERVICAL 6-7, CERVICAL 7 TO THORACIC 1 WITH INSTRUMENTATION AND ALLOGRAFT; REQUEST  4 HO  . ANTERIOR LAT LUMBAR FUSION Left 11/16/2015   Procedure: LEFT SIDED LATERAL INTERBODY FUSION, LUMBAR 2-3, LUMBAR 3-4, LUMBAR 4-5 WITH INSTRUMENTATION;  Surgeon: Phylliss Bob, MD;  Location: [facility];  Service: Orthopedics;  Laterality: Left;  LEFT SIDED LATEARL INTERBODY FUSION, LUMBAR 2-3, LUMBAR 3-4, LUMBAR 4-5 WITH INSTRUMENTATION   . APPENDECTOMY    . BACK SURGERY    . BONE MARROW ASPIRATION  07/2012  . BONE MARROW BIOPSY  07/2012  . BREAST SURGERY    . CARDIAC CATHETERIZATION    . CARDIAC CATHETERIZATION N/A 01/20/2016   Procedure: Left Heart Cath and Coronary Angiography;  Surgeon: Burnell Blanks, MD;  Location: [facility];  Service:  Cardiovascular;  Laterality: N/A;  . COLONOSCOPY  11/29/2010   Procedure: COLONOSCOPY;  Surgeon: Rogene [place], MD;  [facility];  Service: Endoscopy;  Laterality: N/A;  . COLONOSCOPY N/A 02/18/2014   Procedure: COLONOSCOPY;  Surgeon: Rogene [place], MD;  [facility];  Service: Endoscopy;  Laterality: N/A;  1030  . COLONOSCOPY N/A 02/25/2017   Procedure: COLONOSCOPY;  Surgeon: Rogene [place], MD;  [facility];  Service: Endoscopy;  Laterality: N/A;  10:55  . CYSTOSCOPY N/A 12/09/2014   Procedure: CYSTOSCOPY;  Surgeon: Bjorn Loser, MD;  Location: [facility];  Service: Urology;  Laterality: N/A;  . ESOPHAGEAL DILATION N/A 02/25/2017   Procedure: ESOPHAGEAL DILATION;  Surgeon: Rogene [place], MD;  [facility];  Service: Endoscopy;  Laterality: N/A;  . ESOPHAGOGASTRODUODENOSCOPY N/A 02/25/2017   Procedure: ESOPHAGOGASTRODUODENOSCOPY (EGD);  Surgeon: Rogene [place], MD;  [facility];  Service: Endoscopy;  Laterality: N/A;  . IR GENERIC HISTORICAL  01/11/2016   IR RADIOLOGY PERIPHERAL GUIDED IV START 01/11/2016 Saverio Danker, PA-C [facility]  . IR GENERIC HISTORICAL  01/11/2016   IR US GUIDE VASC ACCESS RIGHT 01/11/2016 Saverio Danker, PA-C [facility]  . LYMPH NODE DISSECTION Right 12/30/2018   Procedure: Lymph Node Dissection;  Surgeon: Lajuana Matte, MD;  Location: [facility];  Service: Thoracic;  Laterality: Right;  . MASTECTOMY     left  . OVARIAN CYST SURGERY     x2  . POLYPECTOMY  02/25/2017   Procedure: POLYPECTOMY;  Surgeon: Rogene [place], MD;  [facility];  Service: Endoscopy;;  . PORTACATH PLACEMENT Left 02/16/2019   Procedure: INSERTION PORT-A-CATH (CATHETER  LEFT SUBCLAVIAN);  Surgeon: Aviva Signs, MD;  Location: [facility];  Service: General;  Laterality: Left;  . SALPINGOOPHORECTOMY Bilateral 12/09/2014   Procedure: SALPINGO OOPHORECTOMY;  Surgeon: Servando Salina, MD;  Location: [facility];  Service:  Gynecology;  Laterality: Bilateral;  . TUBAL LIGATION    . VAGINAL HYSTERECTOMY N/A 12/09/2014   Procedure: HYSTERECTOMY VAGINAL;  Surgeon: Servando Salina, MD;  Location: [facility];  Service: Gynecology;  Laterality: N/A;  . VIDEO ASSISTED THORACOSCOPY (VATS)/ LOBECTOMY Right 12/30/2018   Procedure: VIDEO ASSISTED THORACOSCOPY (VATS)/RIGHT LOWER LOBE WEDGE RESECTION, RIGHT UPPER LOBECTOMY;  Surgeon: Lajuana Matte, MD;  Location: [facility];  Service: Thoracic;  Laterality: Right;  Marland Kitchen VIDEO BRONCHOSCOPY N/A 12/30/2018   Procedure: VIDEO BRONCHOSCOPY;  Surgeon: Lajuana Matte, MD;  [facility];  Service: Thoracic;  Laterality: N/A;    SOCIAL HISTORY:  Social History   Socioeconomic History  . Marital status: Divorced    Spouse name: Not on file  . Number of children: 2  . Years of education: Not on file  . Highest education level: Not on  file  Occupational History  . Not on file  Tobacco Use  . Smoking status: Former Smoker    Packs/day: 0.50    Years: 50.00    Pack years: 25.00    Types: Cigarettes    Quit date: 11/25/2018    Years since quitting: 1.6  . Smokeless tobacco: Never Used  Vaping Use  . Vaping Use: Never used  Substance and Sexual Activity  . Alcohol use: Yes    Comment: occasional  . Drug use: No  . Sexual activity: Not Currently    Birth control/protection: Post-menopausal  Other Topics Concern  . Not on file  Social History Narrative  . Not on file   Social Determinants of Health   Financial Resource Strain: Low Risk   . Difficulty of Paying Living Expenses: Not hard at all  Food Insecurity: No Food Insecurity  . Worried About Charity fundraiser in the Last Year: Never true  . Ran Out of Food in the Last Year: Never true  Transportation Needs: No Transportation Needs  . Lack of Transportation (Medical): No  . Lack of Transportation (Non-Medical): No  Physical Activity: Insufficiently Active  . Days of Exercise per Week: 4 days  . Minutes  of Exercise per Session: 30 min  Stress: No Stress Concern Present  . Feeling of Stress : Not at all  Social Connections: Moderately Isolated  . Frequency of Communication with Friends and Family: More than three times a week  . Frequency of Social Gatherings with Friends and Family: More than three times a week  . Attends Religious Services: More than 4 times per year  . Active Member of Clubs or Organizations: No  . Attends Archivist Meetings: Never  . Marital Status: Divorced  Human resources officer Violence: Not At Risk  . Fear of Current or Ex-Partner: No  . Emotionally Abused: No  . Physically Abused: No  . Sexually Abused: No    FAMILY HISTORY:  Family History  Problem Relation Age of Onset  . Hypertension Mother   . Heart failure Mother   . Congestive Heart Failure Mother   . COPD Mother   . Pernicious anemia Mother   . Cancer Mother        lung  . Hypertension Father   . CAD Father   . Heart attack Father   . Hypertension Sister   . Cancer Other   . Celiac disease Other     CURRENT MEDICATIONS:  Current Outpatient Medications  Medication Sig Dispense Refill  . albuterol (PROVENTIL HFA;VENTOLIN HFA) 108 (90 Base) MCG/ACT inhaler Inhale 2 puffs into the lungs every 6 (six) hours as needed for wheezing or shortness of breath. 1 Inhaler 2  . albuterol (PROVENTIL) (2.5 MG/3ML) 0.083% nebulizer solution Take 2.5 mg by nebulization every 6 (six) hours.    . ALPRAZolam (XANAX) 0.5 MG tablet Take one tablet prior to MRIs and/or radiation oncology procedures. 6 tablet 0  . aspirin EC 81 MG tablet Take 81 mg by mouth daily.    . Calcium Carb-Cholecalciferol 600-800 MG-UNIT TABS Take 1 tablet by mouth 2 (two) times daily.    . Cholecalciferol (VITAMIN D-3) 1000 units CAPS Take 1,000 Units daily by mouth.     . clonazePAM (KLONOPIN) 0.5 MG tablet TAKE ONE TABLET BY MOUTH AT BEDTIME. 90 tablet 4  . cyanocobalamin (,VITAMIN B-12,) 1000 MCG/ML injection INJECT 1 ML INTO THE  MUSCLE ONCE MONTHLY AS DIRECTED. (Patient taking differently: Inject 1,000 mcg into the muscle  every 30 (thirty) days.) 1 mL 5  . dexamethasone (DECADRON) 2 MG tablet Take 1 tablet (2 mg total) by mouth daily. 60 tablet 3  . diclofenac Sodium (VOLTAREN) 1 % GEL Apply 2 grams to hands    . ergocalciferol (VITAMIN D2) 1.25 MG (50000 UT) capsule Take by mouth.    . furosemide (LASIX) 40 MG tablet 1/2 in Am and 1 tablet in PM    . gabapentin (NEURONTIN) 600 MG tablet Take 1 tablet (600 mg total) by mouth daily. 30 tablet 6  . guaiFENesin (MUCINEX) 600 MG 12 hr tablet Take 1 tablet (600 mg total) by mouth 2 (two) times daily as needed for cough or to loosen phlegm.    . ibandronate (BONIVA) 150 MG tablet Take 150 mg by mouth every 30 (thirty) days.     Marland Kitchen ibuprofen (ADVIL) 800 MG tablet Take 1 tablet (800 mg total) by mouth every 8 (eight) hours as needed for moderate pain. 30 tablet 6  . ipratropium (ATROVENT) 0.02 % nebulizer solution Take by nebulization.    Marland Kitchen ipratropium-albuterol (DUONEB) 0.5-2.5 (3) MG/3ML SOLN Take 3 mLs by nebulization every 6 (six) hours as needed (shortness of breath). 360 mL 0  . lidocaine-prilocaine (EMLA) cream Apply a small amount to port a cath site and cover with plastic wrap 1 hour prior to chemotherapy appointments 30 g 3  . magic mouthwash w/lidocaine SOLN 10 cc swab and swallow (Patient not taking: Reported on 07/18/2020)    . methocarbamol (ROBAXIN) 500 MG tablet Take 500 mg by mouth every 6 (six) hours as needed for muscle spasms.     . metoCLOPramide (REGLAN) 5 MG tablet 1 tablet before meals    . mirtazapine (REMERON) 15 MG tablet TAKE (1) TABLET BY MOUTH AT BEDTIME. 30 tablet 0  . nitroGLYCERIN (NITRODUR - DOSED IN MG/24 HR) 0.1 mg/hr patch Place 0.1 mg onto the skin daily.    . ondansetron (ZOFRAN ODT) 8 MG disintegrating tablet Take 1 tablet (8 mg total) by mouth every 8 (eight) hours as needed for nausea or vomiting. 60 tablet 2  . oxyCODONE (OXY IR/ROXICODONE)  5 MG immediate release tablet Take 1 tablet (5 mg total) by mouth every 8 (eight) hours. 90 tablet 0  . OXYGEN Inhale 3 L into the lungs daily.    . pantoprazole (PROTONIX) 40 MG tablet TAKE ONE TABLET BY MOUTH TWICE DAILY. 60 tablet 0  . polyethylene glycol (MIRALAX / GLYCOLAX) 17 g packet Take 17 g by mouth daily as needed for mild constipation.    . potassium chloride SA (KLOR-CON) 20 MEQ tablet Take 1 tablet (20 mEq total) by mouth 3 (three) times daily. (Patient not taking: Reported on 07/18/2020) 60 tablet 2  . prochlorperazine (COMPAZINE) 10 MG tablet 1 tablet as needed    . rizatriptan (MAXALT) 10 MG tablet Take 10 mg by mouth 2 (two) times daily. 1-2 times daily May repeat in 2 hours if needed    . rosuvastatin (CRESTOR) 40 MG tablet Take 40 mg by mouth at bedtime.    . sucralfate (CARAFATE) 1 g tablet Take 1 g by mouth 3 (three) times daily.    . traMADol (ULTRAM) 50 MG tablet Take 50 mg by mouth every 6 (six) hours as needed.     No current facility-administered medications for this visit.   Facility-Administered Medications Ordered in Other Visits  Medication Dose Route Frequency Provider Last Rate Last Admin  . 0.9 %  sodium chloride infusion   Intravenous  Once Derek Jack, MD      . heparin lock flush 100 unit/mL  500 Units Intracatheter Once PRN Derek Jack, MD      . ondansetron [facility]) 8 mg in sodium chloride 0.9 % 50 mL IVPB   Intravenous Once Derek Jack, MD      . sodium chloride flush (NS) 0.9 % injection 10 mL  10 mL Intravenous PRN Lockamy, Randi L, NP-C   10 mL at 12/22/19 1240  . sodium chloride flush (NS) 0.9 % injection 10 mL  10 mL Intracatheter PRN Derek Jack, MD      . topotecan (HYCAMTIN) 1.7 mg in sodium chloride 0.9 % 100 mL chemo infusion  1.2 mg/m2 (Order-Specific) Intravenous Once Derek Jack, MD        ALLERGIES:  Allergies  Allergen Reactions  . Penicillins Hives and Rash    Did it involve swelling of the  face/tongue/throat, SOB, or low BP? No Did it involve sudden or severe rash/hives, skin peeling, or any reaction on the inside of your mouth or nose? No Did you need to seek medical attention at a hospital or doctor's office? No When did it last happen?5-10 year If all above answers are "NO", may proceed with cephalosporin use.      PHYSICAL EXAM:  Performance status (ECOG): 1 - Symptomatic but completely ambulatory  Vitals:   07/18/20 1212  BP: 126/79  Pulse: 74  Resp: 16  Temp: (!) 96.8 F (36 C)  SpO2: 92%   Wt Readings from Last 3 Encounters:  07/18/20 103 lb 9.6 oz (47 kg)  06/27/20 99 lb 6.4 oz (45.1 kg)  06/06/20 94 lb 3.2 oz (42.7 kg)   Physical Exam Vitals reviewed.  Constitutional:      Appearance: Normal appearance.  HENT:     Mouth/Throat:     Lips: No lesions.     Mouth: No injury.     Dentition: No gum lesions.     Tongue: No lesions.  Cardiovascular:     Rate and Rhythm: Normal rate and regular rhythm.     Pulses: Normal pulses.     Heart sounds: Normal heart sounds.  Pulmonary:     Effort: Pulmonary effort is normal.     Breath sounds: Normal breath sounds.  Chest:     Comments: Port-a-Cath in L chest Neurological:     General: No focal deficit present.     Mental Status: She is alert and oriented to person, place, and time.  Psychiatric:        Mood and Affect: Mood normal.        Behavior: Behavior normal.     LABORATORY DATA:  I have reviewed the labs as listed.  CBC Latest Ref Rng & Units 07/18/2020 06/27/2020 06/06/2020  WBC 4.0 - 10.5 K/uL 10.0 6.9 11.3(H)  Hemoglobin 12.0 - 15.0 g/dL 10.1(L) 11.1(L) 13.0  Hematocrit 36.0 - 46.0 % 32.4(L) 35.2(L) 41.1  Platelets 150 - 400 K/uL 474(H) 697(H) 533(H)   CMP Latest Ref Rng & Units 07/18/2020 06/27/2020 06/06/2020  Glucose 70 - 99 mg/dL 151(H) 95 171(H)  BUN 8 - 23 mg/dL $Remove'22 20 19  'qjMyZGm$ Creatinine 0.44 - 1.00 mg/dL 1.30(H) 0.92 0.97  Sodium 135 - 145 mmol/L 137 139 137  Potassium 3.5 - 5.1  mmol/L 2.8(L) 4.0 3.5  Chloride 98 - 111 mmol/L 97(L) 105 101  CO2 22 - 32 mmol/L $RemoveB'30 25 26  'ALMjgfGE$ Calcium 8.9 - 10.3 mg/dL 8.1(L) 8.7(L) 10.0  Total Protein 6.5 -  8.1 g/dL 6.3(L) 6.8 6.9  Total Bilirubin 0.3 - 1.2 mg/dL 0.5 0.3 0.6  Alkaline Phos 38 - 126 U/L 46 59 55  AST 15 - 41 U/L $Remo'24 22 21  'KEdOW$ ALT 0 - 44 U/L $Remo'19 19 14    'BwhCS$ DIAGNOSTIC IMAGING:  I have independently reviewed the scans and discussed with the patient. No results found.   ASSESSMENT:  1.Extensive stage small cell lung cancer: -Right upper lobectomy on 12/30/2018, pathology-4.2 cm small cell lung cancer, invading visceral pleura, 1/3 lymph nodes positive, LVI positive, metastatic carcinoma in 211 or lymph nodes, right chest wall biopsy consistent with small cell carcinoma. -30 Gy of radiation in 10 fractions from 01/28/2019 through 02/10/2019. -4 cycles of carboplatin and etoposide from 03/09/2019 through 05/11/2019. -PCI completed on 07/31/2019. -We reviewed MRI of the brain on 09/15/2019 with no evidence of brain meta stasis. Concern for hypointense appearance at the C3 vertebral body and left articular process. -CT chest on 09/15/2019 shows numerous new small solid irregular pulmonary nodules scattered throughout both lungs, largest 7 mm on the right lower lobe. Tiny loculated anterior right pleural effusion decreased. Stable mild subcarinal adenopathy. Bilateral stable adrenal adenomas. -MRI of the C-spine on 10/13/2019 showed marrow edema and enhancement involving C3 vertebral body and left articular process without discrete bone lesion. This was thought to be degenerative. -PET scan on 09/28/2019 shows bilateral lung nodules, below PET resolution. Small right-sided nodular densities demonstrating low-level hypermetabolism. Mild hypermetabolic corresponding to a normal-sized subcarinal lymph node. Multifocal right pleural hypermetabolism in the setting of pleural thickening and prior right upper lobectomy, indeterminate. No  hypermetabolic extrathoracic disease. -PET scan on 11/24/2019 shows progressive increased soft tissue within the right lung along the suture margins, positive right paratracheal lymph node, small subcapsular focus of increased radiotracer uptake overlying the anterolateral right hepatic lobe concerning for tumor. -PET scan on 05/26/2020 with progression with bulky hypermetabolic subcarinal and right hilar lymphadenopathy. Hypermetabolic lymph nodes in the right paratracheal and precarinal stations.   PLAN:  1.Extensive stage small cell lung cancer: -She has tolerated second cycle of topotecan reasonably well. -She had low energy levels for 1 week.  Denied any GI side effects. -Reviewed her labs today which showed white count and platelet count adequate.  LFTs are normal. -She will proceed with topotecan 1.2 mg/M2 cycle 3 today. -RTC 3 weeks for follow-up.  Plan to repeat PET scan prior to next visit to evaluate response.  2. Essential thrombocytosis: -Hydrea on hold since chemotherapy started.  Platelet count is 474.  3.  Normocytic anemia: -Hemoglobin today is 10.1 with MCV of 92.  4. Weight loss: -Continue dexamethasone in the mornings and Remeron daily.  5. Mid back and sternal pain: -Continue oxycodone alternating with tramadol.  Is well controlled.  6. Hypokalemia: -She stopped taking potassium at home few days ago. -Potassium today is 2.8.  She will receive IV potassium and oral potassium.  She will also resume potassium at home.  7. Brain metastasis: -She has completed 3 SRS treatments. -She is on dexamethasone taper 8 mg daily until end of this month.   Orders placed this encounter:  No orders of the defined types were placed in this encounter.    Derek Jack, MD [place] [phone]   I, Milinda Antis, am acting as a scribe for Dr. Sanda Linger.  I, Derek Jack MD, have reviewed the above documentation for  accuracy and completeness, and I agree with the above.

## 2020-07-18 NOTE — Progress Notes (Signed)
Nutrition Follow-up:  Patient with lung cancer receiving Topotecan and completed SRS radiation on 3/14.  Met with patient and daughter in clinic. Patient reports improved appetite and continues to take appetite stimulant. She has not eaten today, daughter reports going for stew at [place] after appointment. Patient has been eating soups, peanut butter crackers, ice cream, and drinking mostly water, recently started drinking some Pepsi. Yesterday patient had peanut butter crackers, spaghetti, 1/2 french dip sandwich, and a big piece of coconut cake. Patient asking for recipes today. She reports persistent nausea improved with zofran and is taking 30 minutes prior to meal times. Patient reports some constipation resolved with Miralax.   Medications: D3, Decadron, Lasix 40 mg, Gabapentin, Remeron, Zofran, Oxycodone, Miralax, Compazine. Klor-con  Labs: K 2.8, Glucose 151, Cr 1.30, Albumin 3.4  Anthropometrics: Weight 103 lb 9.6 oz today up 4 lbs from 99 lb 6.4 oz on 3/14  2/21- 94 lb 3.2 oz 1/20 - 95 lb 1.6 oz  NUTRITION DIAGNOSIS: Inadequate oral intake improving  INTERVENTION:  Continue appetite stimulants Encouraged patient to continue eating high calorie, high protein foods Handout with recipes provided RD contact information provided     MONITORING, EVALUATION, GOAL: weight trends, oral intake   NEXT VISIT: To be scheduled with next MD follow-up in clinic     

## 2020-07-18 NOTE — Patient Instructions (Signed)
[place] at [facility] Discharge Instructions  You were seen today by Dr. Delton Coombes. He went over your recent results. You received your treatment today; continue getting your daily treatment this week. Continue taking potassium three times daily. You will be scheduled to have a PET scan done before your next visit. Dr. Delton Coombes will see you back in 3 weeks for labs and follow up.   Thank you for choosing [place] at [facility] to provide your oncology and hematology care.  To afford each patient quality time with our provider, please arrive at least 15 minutes before your scheduled appointment time.   If you have a lab appointment with the [facility] please come in thru the Main Entrance and check in at the main information desk  You need to re-schedule your appointment should you arrive 10 or more minutes late.  We strive to give you quality time with our providers, and arriving late affects you and other patients whose appointments are after yours.  Also, if you no show three or more times for appointments you may be dismissed from the clinic at the providers discretion.     Again, thank you for choosing [facility].  Our hope is that these requests will decrease the amount of time that you wait before being seen by our physicians.       _____________________________________________________________  Should you have questions after your visit to [facility], please contact our office at [phone] between the hours of 8:00 a.m. and 4:30 p.m.  Voicemails left after 4:00 p.m. will not be returned until the following business day.  For prescription refill requests, have your pharmacy contact our office and allow 72 hours.    Cancer Center Support Programs:   > Cancer Support Group  2nd Tuesday of the month 1pm-2pm, Journey Room

## 2020-07-19 ENCOUNTER — Inpatient Hospital Stay (HOSPITAL_COMMUNITY): Payer: Medicare Other

## 2020-07-19 ENCOUNTER — Encounter (HOSPITAL_COMMUNITY): Payer: Self-pay

## 2020-07-19 VITALS — BP 112/59 | HR 81 | Temp 98.6°F | Resp 18

## 2020-07-19 DIAGNOSIS — Z5111 Encounter for antineoplastic chemotherapy: Secondary | ICD-10-CM | POA: Diagnosis not present

## 2020-07-19 DIAGNOSIS — C3491 Malignant neoplasm of unspecified part of right bronchus or lung: Secondary | ICD-10-CM

## 2020-07-19 MED ORDER — SODIUM CHLORIDE 0.9 % IV SOLN
Freq: Once | INTRAVENOUS | Status: AC
Start: 2020-07-19 — End: 2020-07-19

## 2020-07-19 MED ORDER — SODIUM CHLORIDE 0.9% FLUSH
10.0000 mL | INTRAVENOUS | Status: DC | PRN
  Administered 2020-07-19: 10 mL

## 2020-07-19 MED ORDER — TOPOTECAN HCL CHEMO INJECTION 4 MG
1.2000 mg/m2 | Freq: Once | INTRAVENOUS | Status: AC
  Administered 2020-07-19: 1.7 mg via INTRAVENOUS
  Filled 2020-07-19: qty 1.7

## 2020-07-19 MED ORDER — HEPARIN SOD (PORK) LOCK FLUSH 100 UNIT/ML IV SOLN
500.0000 [IU] | Freq: Once | INTRAVENOUS | Status: AC | PRN
  Administered 2020-07-19: 500 [IU]

## 2020-07-19 MED ORDER — ONDANSETRON HCL 40 MG/20ML IJ SOLN
Freq: Once | INTRAMUSCULAR | Status: AC
  Administered 2020-07-19: 8 mg via INTRAVENOUS
  Filled 2020-07-19: qty 4

## 2020-07-19 NOTE — Progress Notes (Signed)
Patient presents today for treatment. Vital signs are stable. MAR reviewed and updated. Patient denies any side effects from her last treatment. Patient has chronic back pain that she rates a 3/10 today. See MAR . Patient states, " I feel better today than I have in a while." Patient states the steroids she takes prior to treatment are keeping her up at night. Patient teaching performed.   Treatment given today per MD orders. Tolerated infusion without adverse affects. Vital signs stable. No complaints at this time. Discharged from clinic via wheel chair in stable condition. Alert and oriented x 3. F/U with [facility] as scheduled.

## 2020-07-19 NOTE — Patient Instructions (Signed)
[place] Discharge Instructions for Patients Receiving Chemotherapy  Today you received the following chemotherapy agent.   To help prevent nausea and vomiting after your treatment, we encourage you to take your nausea medication.   If you develop nausea and vomiting that is not controlled by your nausea medication, call the clinic.   BELOW ARE SYMPTOMS THAT SHOULD BE REPORTED IMMEDIATELY:  *FEVER GREATER THAN 100.5 F  *CHILLS WITH OR WITHOUT FEVER  NAUSEA AND VOMITING THAT IS NOT CONTROLLED WITH YOUR NAUSEA MEDICATION  *UNUSUAL SHORTNESS OF BREATH  *UNUSUAL BRUISING OR BLEEDING  TENDERNESS IN MOUTH AND THROAT WITH OR WITHOUT PRESENCE OF ULCERS  *URINARY PROBLEMS  *BOWEL PROBLEMS  UNUSUAL RASH Items with * indicate a potential emergency and should be followed up as soon as possible.  Feel free to call the clinic should you have any questions or concerns. The clinic phone number is [phone].  Please show the [place] at check-in to the Emergency Department and triage nurse.

## 2020-07-20 ENCOUNTER — Other Ambulatory Visit (HOSPITAL_COMMUNITY): Payer: Self-pay | Admitting: Hematology

## 2020-07-20 ENCOUNTER — Ambulatory Visit: Payer: Medicare Other | Admitting: Radiation Oncology

## 2020-07-20 ENCOUNTER — Other Ambulatory Visit (HOSPITAL_COMMUNITY): Payer: Self-pay | Admitting: *Deleted

## 2020-07-20 ENCOUNTER — Encounter (HOSPITAL_COMMUNITY): Payer: Self-pay

## 2020-07-20 ENCOUNTER — Inpatient Hospital Stay (HOSPITAL_COMMUNITY): Payer: Medicare Other

## 2020-07-20 ENCOUNTER — Other Ambulatory Visit: Payer: Self-pay

## 2020-07-20 VITALS — BP 96/56 | HR 84 | Temp 97.2°F | Resp 18

## 2020-07-20 DIAGNOSIS — C3491 Malignant neoplasm of unspecified part of right bronchus or lung: Secondary | ICD-10-CM

## 2020-07-20 DIAGNOSIS — E876 Hypokalemia: Secondary | ICD-10-CM

## 2020-07-20 DIAGNOSIS — Z5111 Encounter for antineoplastic chemotherapy: Secondary | ICD-10-CM | POA: Diagnosis not present

## 2020-07-20 MED ORDER — ONDANSETRON HCL 40 MG/20ML IJ SOLN
Freq: Once | INTRAMUSCULAR | Status: AC
  Administered 2020-07-20: 8 mg via INTRAVENOUS
  Filled 2020-07-20: qty 4

## 2020-07-20 MED ORDER — MAGIC MOUTHWASH W/LIDOCAINE: ORAL | 1 refills | Status: DC

## 2020-07-20 MED ORDER — SODIUM CHLORIDE 0.9 % IV SOLN
1.2000 mg/m2 | Freq: Once | INTRAVENOUS | Status: AC
  Administered 2020-07-20: 1.7 mg via INTRAVENOUS
  Filled 2020-07-20: qty 1.7

## 2020-07-20 MED ORDER — SODIUM CHLORIDE 0.9 % IV SOLN
Freq: Once | INTRAVENOUS | Status: AC
Start: 2020-07-20 — End: 2020-07-20

## 2020-07-20 MED ORDER — HEPARIN SOD (PORK) LOCK FLUSH 100 UNIT/ML IV SOLN
500.0000 [IU] | Freq: Once | INTRAVENOUS | Status: AC | PRN
  Administered 2020-07-20: 500 [IU]

## 2020-07-20 MED ORDER — MAGIC MOUTHWASH W/LIDOCAINE: ORAL | 1 refills | Status: AC

## 2020-07-20 MED ORDER — SODIUM CHLORIDE 0.9% FLUSH
10.0000 mL | INTRAVENOUS | Status: DC | PRN
  Administered 2020-07-20 (×2): 10 mL

## 2020-07-20 NOTE — Op Note (Signed)
Name: Ashley [name]    MRN: [id number]   Date: 06/20/2020    DOB: Aug 27, 1950   STEREOTACTIC RADIOSURGERY OPERATIVE NOTE  PRE-OPERATIVE DIAGNOSIS:  Metastatic Small Cell Lung Cancer  POST-OPERATIVE DIAGNOSIS:  Same  PROCEDURE:  Stereotactic Radiosurgery  SURGEON:  Elwin Sleight, DO  RADIATION ONCOLOGIST: Dr. Vickii Penna, MD  TECHNIQUE:  The patient underwent a radiation treatment planning session in the radiation oncology simulation suite under the care of the radiation oncology physician and physicist.  I participated closely in the radiation treatment planning afterwards. The patient underwent planning CT which was fused to 3T high resolution MRI with 1 mm axial slices.  These images were fused on the planning system.  We contoured the gross target volumes and subsequently expanded this to yield the Planning Target Volume. I actively participated in the planning process.  I helped to define and review the target contours and also the contours of the optic pathway, eyes, brainstem and selected nearby organs at risk.  All the dose constraints for critical structures were reviewed and compared to AAPM Task Group 101.  The prescription dose conformity was reviewed.  I approved the plan electronically.    Accordingly, Ashley [name]  was brought to the TrueBeam stereotactic radiation treatment linac and placed in the custom immobilization mask.  The patient was aligned according to the IR fiducial markers with BrainLab Exactrac, then orthogonal x-rays were used in ExacTrac with the 6DOF robotic table and the shifts were made to align the patient.  Ashley [name] received stereotactic radiosurgery to a prescription dose of 9Gy uneventfully.    The detailed description of the procedure is recorded in the radiation oncology procedure note.  I was present for the duration of the procedure.  DISPOSITION:   Following delivery, the patient was transported to nursing in stable condition and monitored for possible  acute effects to be discharged to home in stable condition with follow-up in one month.  Elwin Sleight, [facility]

## 2020-07-20 NOTE — Patient Instructions (Signed)
[place] Discharge Instructions for Patients Receiving Chemotherapy  Today you received the following chemotherapy agents Topotecan.   To help prevent nausea and vomiting after your treatment, we encourage you to take your nausea medication.   If you develop nausea and vomiting that is not controlled by your nausea medication, call the clinic.   BELOW ARE SYMPTOMS THAT SHOULD BE REPORTED IMMEDIATELY:  *FEVER GREATER THAN 100.5 F  *CHILLS WITH OR WITHOUT FEVER  NAUSEA AND VOMITING THAT IS NOT CONTROLLED WITH YOUR NAUSEA MEDICATION  *UNUSUAL SHORTNESS OF BREATH  *UNUSUAL BRUISING OR BLEEDING  TENDERNESS IN MOUTH AND THROAT WITH OR WITHOUT PRESENCE OF ULCERS  *URINARY PROBLEMS  *BOWEL PROBLEMS  UNUSUAL RASH Items with * indicate a potential emergency and should be followed up as soon as possible.  Feel free to call the clinic should you have any questions or concerns. The clinic phone number is [phone].  Please show the [place] at check-in to the Emergency Department and triage nurse.

## 2020-07-20 NOTE — Progress Notes (Signed)
Treatment given today per MD orders. Tolerated infusion without adverse affects. Vital signs stable. No complaints at this time. Discharged from clinic via wheel chair in stable condition. Alert and oriented x 3. F/U with [facility] as scheduled.

## 2020-07-21 ENCOUNTER — Encounter (HOSPITAL_COMMUNITY): Payer: Self-pay

## 2020-07-21 ENCOUNTER — Inpatient Hospital Stay (HOSPITAL_COMMUNITY): Payer: Medicare Other

## 2020-07-21 VITALS — BP 97/51 | HR 74 | Temp 97.0°F | Resp 18

## 2020-07-21 DIAGNOSIS — C3491 Malignant neoplasm of unspecified part of right bronchus or lung: Secondary | ICD-10-CM

## 2020-07-21 DIAGNOSIS — Z5111 Encounter for antineoplastic chemotherapy: Secondary | ICD-10-CM | POA: Diagnosis not present

## 2020-07-21 MED ORDER — HEPARIN SOD (PORK) LOCK FLUSH 100 UNIT/ML IV SOLN
500.0000 [IU] | Freq: Once | INTRAVENOUS | Status: AC | PRN
  Administered 2020-07-21: 500 [IU]

## 2020-07-21 MED ORDER — SODIUM CHLORIDE 0.9% FLUSH
10.0000 mL | INTRAVENOUS | Status: DC | PRN
  Administered 2020-07-21: 10 mL

## 2020-07-21 MED ORDER — TOPOTECAN HCL CHEMO INJECTION 4 MG
1.2000 mg/m2 | Freq: Once | INTRAVENOUS | Status: AC
  Administered 2020-07-21: 1.7 mg via INTRAVENOUS
  Filled 2020-07-21: qty 1.7

## 2020-07-21 MED ORDER — SODIUM CHLORIDE 0.9 % IV SOLN: Freq: Once | INTRAVENOUS | Status: AC

## 2020-07-21 MED ORDER — SODIUM CHLORIDE 0.9 % IV SOLN
Freq: Once | INTRAVENOUS | Status: AC
Start: 2020-07-21 — End: 2020-07-21
  Administered 2020-07-21: 8 mg via INTRAVENOUS
  Filled 2020-07-21: qty 4

## 2020-07-21 NOTE — Patient Instructions (Signed)
[place] Discharge Instructions for Patients Receiving Chemotherapy  Today you received the following chemotherapy agents Topotecan.   To help prevent nausea and vomiting after your treatment, we encourage you to take your nausea medication.    If you develop nausea and vomiting that is not controlled by your nausea medication, call the clinic.   BELOW ARE SYMPTOMS THAT SHOULD BE REPORTED IMMEDIATELY:  *FEVER GREATER THAN 100.5 F  *CHILLS WITH OR WITHOUT FEVER  NAUSEA AND VOMITING THAT IS NOT CONTROLLED WITH YOUR NAUSEA MEDICATION  *UNUSUAL SHORTNESS OF BREATH  *UNUSUAL BRUISING OR BLEEDING  TENDERNESS IN MOUTH AND THROAT WITH OR WITHOUT PRESENCE OF ULCERS  *URINARY PROBLEMS  *BOWEL PROBLEMS  UNUSUAL RASH Items with * indicate a potential emergency and should be followed up as soon as possible.  Feel free to call the clinic should you have any questions or concerns. The clinic phone number is [phone].  Please show the [place] at check-in to the Emergency Department and triage nurse.

## 2020-07-21 NOTE — Progress Notes (Signed)
Topotecan given today per MD orders. Tolerated infusion without adverse affects. Vital signs stable. No complaints at this time. Discharged from clinic via wheel chair in stable condition accompanied by daughter. Alert and oriented x 3. F/U with [facility] as scheduled.

## 2020-07-22 ENCOUNTER — Other Ambulatory Visit: Payer: Self-pay

## 2020-07-22 ENCOUNTER — Encounter (HOSPITAL_COMMUNITY): Payer: Self-pay

## 2020-07-22 ENCOUNTER — Inpatient Hospital Stay (HOSPITAL_COMMUNITY): Payer: Medicare Other

## 2020-07-22 VITALS — BP 105/55 | HR 70 | Temp 97.8°F | Resp 18

## 2020-07-22 DIAGNOSIS — C3491 Malignant neoplasm of unspecified part of right bronchus or lung: Secondary | ICD-10-CM

## 2020-07-22 DIAGNOSIS — Z5111 Encounter for antineoplastic chemotherapy: Secondary | ICD-10-CM | POA: Diagnosis not present

## 2020-07-22 MED ORDER — TOPOTECAN HCL CHEMO INJECTION 4 MG
1.2000 mg/m2 | Freq: Once | INTRAVENOUS | Status: AC
  Administered 2020-07-22: 1.7 mg via INTRAVENOUS
  Filled 2020-07-22: qty 1.7

## 2020-07-22 MED ORDER — SODIUM CHLORIDE 0.9 % IV SOLN
Freq: Once | INTRAVENOUS | Status: AC
  Administered 2020-07-22: 8 mg via INTRAVENOUS
  Filled 2020-07-22: qty 4

## 2020-07-22 MED ORDER — SODIUM CHLORIDE 0.9 % IV SOLN: Freq: Once | INTRAVENOUS | Status: AC

## 2020-07-22 MED ORDER — SODIUM CHLORIDE 0.9% FLUSH
10.0000 mL | INTRAVENOUS | Status: DC | PRN
  Administered 2020-07-22: 10 mL

## 2020-07-22 MED ORDER — HEPARIN SOD (PORK) LOCK FLUSH 100 UNIT/ML IV SOLN
500.0000 [IU] | Freq: Once | INTRAVENOUS | Status: AC | PRN
  Administered 2020-07-22: 500 [IU]

## 2020-07-22 NOTE — Progress Notes (Signed)
Patient presents today for D5 Topotecan. Vital signs stable. Patient denies any side effects from chemotherapy. Patient has no complaints today. MAR reviewed and updated.   Treatment given today per MD orders. Tolerated infusion without adverse affects. Vital signs stable. No complaints at this time. Discharged from via wheel chair in stable condition. Alert and oriented x 3. F/U with [facility] as scheduled.

## 2020-07-22 NOTE — Patient Instructions (Signed)
[facility] Discharge Instructions for Patients Receiving Chemotherapy  Today you received the following chemotherapy agents   To help prevent nausea and vomiting after your treatment, we encourage you to take your nausea medication   If you develop nausea and vomiting that is not controlled by your nausea medication, call the clinic.   BELOW ARE SYMPTOMS THAT SHOULD BE REPORTED IMMEDIATELY:  *FEVER GREATER THAN 100.5 F  *CHILLS WITH OR WITHOUT FEVER  NAUSEA AND VOMITING THAT IS NOT CONTROLLED WITH YOUR NAUSEA MEDICATION  *UNUSUAL SHORTNESS OF BREATH  *UNUSUAL BRUISING OR BLEEDING  TENDERNESS IN MOUTH AND THROAT WITH OR WITHOUT PRESENCE OF ULCERS  *URINARY PROBLEMS  *BOWEL PROBLEMS  UNUSUAL RASH Items with * indicate a potential emergency and should be followed up as soon as possible.  Feel free to call the clinic should you have any questions or concerns. The clinic phone number is [phone].  Please show the CHEMO ALERT CARD at check-in to the Emergency Department and triage nurse.   

## 2020-08-03 ENCOUNTER — Ambulatory Visit: Payer: Self-pay | Admitting: Radiation Oncology

## 2020-08-03 ENCOUNTER — Ambulatory Visit (HOSPITAL_COMMUNITY): Admission: RE | Admit: 2020-08-03 | Payer: Medicare Other | Source: Ambulatory Visit

## 2020-08-08 ENCOUNTER — Inpatient Hospital Stay (HOSPITAL_COMMUNITY): Payer: Medicare Other | Admitting: Dietician

## 2020-08-08 ENCOUNTER — Inpatient Hospital Stay (HOSPITAL_BASED_OUTPATIENT_CLINIC_OR_DEPARTMENT_OTHER): Payer: Medicare Other | Admitting: Hematology

## 2020-08-08 ENCOUNTER — Other Ambulatory Visit: Payer: Self-pay

## 2020-08-08 ENCOUNTER — Other Ambulatory Visit (HOSPITAL_COMMUNITY): Payer: Self-pay | Admitting: *Deleted

## 2020-08-08 ENCOUNTER — Inpatient Hospital Stay (HOSPITAL_COMMUNITY): Payer: Medicare Other

## 2020-08-08 ENCOUNTER — Telehealth: Payer: Self-pay | Admitting: Radiation Oncology

## 2020-08-08 VITALS — BP 94/61 | HR 81 | Temp 97.2°F | Resp 18

## 2020-08-08 VITALS — BP 131/81 | HR 76 | Temp 97.8°F | Resp 18

## 2020-08-08 DIAGNOSIS — C3491 Malignant neoplasm of unspecified part of right bronchus or lung: Secondary | ICD-10-CM

## 2020-08-08 DIAGNOSIS — E876 Hypokalemia: Secondary | ICD-10-CM

## 2020-08-08 DIAGNOSIS — C50912 Malignant neoplasm of unspecified site of left female breast: Secondary | ICD-10-CM

## 2020-08-08 DIAGNOSIS — Z5111 Encounter for antineoplastic chemotherapy: Secondary | ICD-10-CM | POA: Diagnosis not present

## 2020-08-08 LAB — MAGNESIUM: Magnesium: 2.2 mg/dL (ref 1.7–2.4)

## 2020-08-08 LAB — CBC WITH DIFFERENTIAL/PLATELET
Band Neutrophils: 3 %
Basophils Absolute: 0 10*3/uL (ref 0.0–0.1)
Basophils Relative: 0 %
Eosinophils Absolute: 0 10*3/uL (ref 0.0–0.5)
Eosinophils Relative: 0 %
HCT: 30.6 % — ABNORMAL LOW (ref 36.0–46.0)
Hemoglobin: 9.6 g/dL — ABNORMAL LOW (ref 12.0–15.0)
Lymphocytes Relative: 5 %
Lymphs Abs: 0.7 10*3/uL (ref 0.7–4.0)
MCH: 30.4 pg (ref 26.0–34.0)
MCHC: 31.4 g/dL (ref 30.0–36.0)
MCV: 96.8 fL (ref 80.0–100.0)
Metamyelocytes Relative: 2 %
Monocytes Absolute: 0.3 10*3/uL (ref 0.1–1.0)
Monocytes Relative: 2 %
Myelocytes: 2 %
Neutro Abs: 11.9 10*3/uL — ABNORMAL HIGH (ref 1.7–7.7)
Neutrophils Relative %: 86 %
Platelets: 477 10*3/uL — ABNORMAL HIGH (ref 150–400)
RBC: 3.16 MIL/uL — ABNORMAL LOW (ref 3.87–5.11)
RDW: 21.8 % — ABNORMAL HIGH (ref 11.5–15.5)
WBC: 13.4 10*3/uL — ABNORMAL HIGH (ref 4.0–10.5)
nRBC: 0.4 % — ABNORMAL HIGH (ref 0.0–0.2)

## 2020-08-08 LAB — COMPREHENSIVE METABOLIC PANEL
ALT: 22 U/L (ref 0–44)
AST: 27 U/L (ref 15–41)
Albumin: 3.5 g/dL (ref 3.5–5.0)
Alkaline Phosphatase: 45 U/L (ref 38–126)
Anion gap: 11 (ref 5–15)
BUN: 16 mg/dL (ref 8–23)
CO2: 26 mmol/L (ref 22–32)
Calcium: 8.7 mg/dL — ABNORMAL LOW (ref 8.9–10.3)
Chloride: 100 mmol/L (ref 98–111)
Creatinine, Ser: 1.25 mg/dL — ABNORMAL HIGH (ref 0.44–1.00)
GFR, Estimated: 46 mL/min — ABNORMAL LOW (ref >60)
Glucose, Bld: 174 mg/dL — ABNORMAL HIGH (ref 70–99)
Potassium: 3.7 mmol/L (ref 3.5–5.1)
Sodium: 137 mmol/L (ref 135–145)
Total Bilirubin: 0.5 mg/dL (ref 0.3–1.2)
Total Protein: 6.4 g/dL — ABNORMAL LOW (ref 6.5–8.1)

## 2020-08-08 MED ORDER — OXYCODONE HCL 5 MG PO TABS: 5.0000 mg | ORAL_TABLET | Freq: Three times a day (TID) | ORAL | 0 refills | Status: DC

## 2020-08-08 MED ORDER — CLONAZEPAM 0.5 MG PO TABS
0.5000 mg | ORAL_TABLET | Freq: Every day | ORAL | 4 refills | Status: AC
Start: 2020-08-08 — End: ?

## 2020-08-08 MED ORDER — LIDOCAINE-PRILOCAINE 2.5-2.5 % EX CREA: TOPICAL_CREAM | CUTANEOUS | 3 refills | Status: AC

## 2020-08-08 MED ORDER — SODIUM CHLORIDE 0.9 % IV SOLN: Freq: Once | INTRAVENOUS | Status: AC

## 2020-08-08 MED ORDER — HEPARIN SOD (PORK) LOCK FLUSH 100 UNIT/ML IV SOLN
500.0000 [IU] | Freq: Once | INTRAVENOUS | Status: AC | PRN
  Administered 2020-08-08: 500 [IU]

## 2020-08-08 MED ORDER — ONDANSETRON HCL 40 MG/20ML IJ SOLN
Freq: Once | INTRAMUSCULAR | Status: AC
  Administered 2020-08-08: 8 mg via INTRAVENOUS
  Filled 2020-08-08: qty 4

## 2020-08-08 MED ORDER — SODIUM CHLORIDE 0.9 % IV SOLN
1.2000 mg/m2 | Freq: Once | INTRAVENOUS | Status: AC
  Administered 2020-08-08: 1.7 mg via INTRAVENOUS
  Filled 2020-08-08: qty 1.7

## 2020-08-08 MED ORDER — SODIUM CHLORIDE 0.9% FLUSH
10.0000 mL | INTRAVENOUS | Status: DC | PRN
  Administered 2020-08-08: 10 mL

## 2020-08-08 MED ORDER — POTASSIUM CHLORIDE CRYS ER 20 MEQ PO TBCR: 20.0000 meq | EXTENDED_RELEASE_TABLET | Freq: Three times a day (TID) | ORAL | 2 refills | Status: AC

## 2020-08-08 NOTE — Telephone Encounter (Signed)
Pt daughter, [name], calling to change f/u with Dr. Isidore Moos for patient on 5/6 to an afternoon appt. I am needing to msg Dr. Isidore Moos first to check on afternoon appts since the next one is on 5/20 and would be too far out from scan date.

## 2020-08-08 NOTE — Patient Instructions (Signed)

## 2020-08-08 NOTE — Patient Instructions (Signed)
[place] at [facility] Discharge Instructions  You were seen today by Dr. Delton Coombes. He went over your recent results. You received your treatment today; continue getting your daily treatment. Keep your appointment on May 2nd to have your PET scan done. Your Klonopin dose will be increased to 0.75 mg at bedtime. Dr. Delton Coombes will see you back in 3 weeks for labs and follow up.   Thank you for choosing [place] at [facility] to provide your oncology and hematology care.  To afford each patient quality time with our provider, please arrive at least 15 minutes before your scheduled appointment time.   If you have a lab appointment with the [facility] please come in thru the Main Entrance and check in at the main information desk  You need to re-schedule your appointment should you arrive 10 or more minutes late.  We strive to give you quality time with our providers, and arriving late affects you and other patients whose appointments are after yours.  Also, if you no show three or more times for appointments you may be dismissed from the clinic at the providers discretion.     Again, thank you for choosing [facility].  Our hope is that these requests will decrease the amount of time that you wait before being seen by our physicians.       _____________________________________________________________  Should you have questions after your visit to [facility], please contact our office at [phone] between the hours of 8:00 a.m. and 4:30 p.m.  Voicemails left after 4:00 p.m. will not be returned until the following business day.  For prescription refill requests, have your pharmacy contact our office and allow 72 hours.    Cancer Center Support Programs:   > Cancer Support Group  2nd Tuesday of the month 1pm-2pm, Journey Room

## 2020-08-08 NOTE — Progress Notes (Signed)
Patient tolerated chemotherapy with no complaints voiced.  Side effects with management reviewed with understanding verbalized.  Port site clean and dry with no bruising or swelling noted at site.  Good blood return noted before and after administration of chemotherapy.  Dressing intact.   Patient left in satisfactory condition with VSS and no s/s of distress noted.

## 2020-08-08 NOTE — Progress Notes (Signed)
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 Right upper lobectomy on 12/30/2018. 2.Radiation to whole brain 25Gy in 10 fractions from 01/28/2019 through 02/10/2019. 3. Carboplatin and etoposide x 4 cycles from 03/09/2019 through 05/11/2019. 4. Lurbinectedin x 7 cycles from 12/15/2019 to 05/05/2020. 5. SBRT to brain metastases in 3 fractions from 06/22/2020 to 06/27/2020.  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 The 3 liver lesions are not hypermetabolic. The radiologist suspects they may be subtly present on prior CT chest from 10/2013, to further reassuring that these are likely benign lesions.   Melanoma ([facility])  07/09/2013 Initial Biopsy   Initial biopsy L upper thigh/buttocks melanoma   07/20/2013 Surgery   Excision L lateral buttock/upper thigh melanoma, clear margins   07/20/2013 Pathology Results   Breslow depth 1.02 mm, pT2a    Small cell lung carcinoma, right ([place])  11/27/2018 Initial Diagnosis   Small cell lung carcinoma, right ([facility])   03/09/2019 - 05/15/2019 Chemotherapy   The patient had palonosetron (ALOXI) injection 0.25 mg, 0.25 mg, Intravenous,  Once, 4 of 4 cycles Administration:  0.25 mg (03/09/2019), 0.25 mg (03/30/2019), 0.25 mg (04/20/2019), 0.25 mg (05/11/2019) pegfilgrastim (NEULASTA ONPRO KIT) injection 6 mg, 6 mg, Subcutaneous, Once, 1 of 1 cycle Administration: 6 mg (03/11/2019) pegfilgrastim-jmdb (FULPHILA) injection 6 mg, 6 mg, Subcutaneous,  Once, 3 of 3 cycles Administration: 6 mg (04/03/2019), 6 mg (04/24/2019), 6 mg (05/15/2019) CARBOplatin (PARAPLATIN) 330 mg in sodium chloride 0.9 % 250 mL chemo infusion, 330 mg (100 % of original dose 325.5 mg), Intravenous,  Once, 4 of 4 cycles Dose modification:   (original dose 325.5 mg, Cycle 1),   (original dose 325.5 mg, Cycle 2),   (original dose 309 mg, Cycle 3),   (original dose 325.5 mg, Cycle 4) Administration: 330 mg (03/09/2019), 330 mg (03/30/2019), 310 mg (04/20/2019), 330 mg (05/11/2019) etoposide (VEPESID) 150 mg in sodium chloride 0.9 % 500 mL chemo infusion, 100 mg/m2 = 150 mg, Intravenous,  Once, 4 of 4 cycles Administration: 150 mg (03/09/2019), 150 mg (03/10/2019), 150 mg (03/11/2019), 150 mg (03/30/2019), 150 mg (03/31/2019), 150 mg (04/01/2019), 150 mg (04/20/2019), 150 mg (04/21/2019), 150 mg (04/22/2019), 150 mg (05/11/2019), 150 mg (05/12/2019), 150 mg (05/13/2019) fosaprepitant (EMEND) 150 mg, dexamethasone (DECADRON) 12 mg in sodium chloride 0.9 % 145 mL IVPB, , Intravenous,  Once, 4 of 4 cycles Administration:  (03/09/2019),  (03/30/2019),  (04/20/2019),  (05/11/2019)  for chemotherapy treatment.    03/09/2019 Cancer Staging   Staging form: Lung, AJCC 8th Edition - Clinical: Stage IIIA (cT3, cN1, cM0) - Signed by Derek Jack, MD on 03/09/2019   12/15/2019 - 05/05/2020 Chemotherapy  06/06/2020 -  Chemotherapy    Patient is on Treatment Plan: LUNG SMALL CELL TOPOTECAN D1-5 Q21D        CANCER STAGING: Cancer Staging Small cell lung carcinoma, right (HCC) Staging form: Lung, AJCC 8th Edition - Clinical: Stage IIIA (cT3, cN1, cM0) - Signed by Derek Jack, MD on  03/09/2019   INTERVAL HISTORY:  Ms. RILLEY POULTER, a 70 y.o. female, returns for routine follow-up and consideration for next cycle of chemotherapy. Chelcie was last seen on 07/18/2020.  Due for cycle #4 of topotecan today.   Today she is accompanied by her daughter. Overall, she tells me she has been feeling okay. She tolerated the previous treatment well and reports having nausea for which she takes Zofran, but denies V/D. She complains of having muscle cramps in her hands and feet randomly during day and night, at times lasting the entire day. She is taking Decadron 4 mg QD and will finish tapering on 04/29; her appetite has been good but her sleep has been troubled. She reports that Decadron 2 mg was not affecting her sleep. She denies having any headaches or vision changes.   She is scheduled to have her PET scan done in [place] on 05/02.  Overall, she feels ready for next cycle of chemo today.    REVIEW OF SYSTEMS:  Review of Systems  Constitutional: Positive for appetite change (75%) and fatigue (25%).  Eyes: Negative for eye problems.  Cardiovascular: Positive for chest pain.  Musculoskeletal: Positive for myalgias (random cramping in hands & feet).  Neurological: Negative for headaches.  Psychiatric/Behavioral: Positive for sleep disturbance (d/t Decadron taper). The patient is nervous/anxious.   All other systems reviewed and are negative.   PAST MEDICAL/SURGICAL HISTORY:  Past Medical History:  Diagnosis Date  . 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 Anginal pain ([place])   . Arthritis   . Ascending aortic aneurysm ([facility])   . Cancer [facility]) 1995   breast, left, mastectomy/chemo  . Chest pain    Possibly cardiac. No evidence of ischemia/injury based upon normal troponin I. Chest discomfort could be tachycardia induced supply demand mismatch.   . CHF (congestive heart failure) ([facility]) 11/17/2015   after surgery   . Colon adenomas   . Coronary artery disease   . DJD  (degenerative joint disease)   . Dyspnea    with exertion  . Emphysema of lung ([facility]) 11/17/2015  . Essential hypertension, benign   . GERD (gastroesophageal reflux disease)   . History of hiatal hernia   . Hyperlipidemia   . Hypertension   . Melanoma ([facility]) 01/09/2016  . Osteopenia   . Palpitations   . Pernicious anemia 03/06/2016  . Pernicious anemia   . Pure hypercholesterolemia   . Raynaud's disease   . Thrombocythemia, essential ([facility]) 01/09/2016  . Thrombocytosis    Idiopathic  . Vitamin D deficiency    Past Surgical History:  Procedure Laterality Date  . ABDOMINAL HYSTERECTOMY    . ANTERIOR AND POSTERIOR REPAIR N/A 12/09/2014   Procedure: ANTERIOR (CYSTOCELE) AND POSTERIOR REPAIR (RECTOCELE);  Surgeon: Bjorn Loser, MD;  Location: [facility];  Service: Urology;  Laterality: N/A;  . ANTERIOR CERVICAL DECOMPRESSION/DISCECTOMY FUSION 4 LEVELS Right 10/03/2016   Procedure: ANTERIOR CERVICAL DECOMPRESSION FUSION, CERVICAL 4-5, CERVICAL 5-6, CERVICAL 6-7, CERVICAL 7 TO THORACIC 1 WITH INSTRUMENTATION AND ALLOGRAFT;  Surgeon: Phylliss Bob, MD;  Location: [facility];  Service: Orthopedics;  Laterality: Right;  ANTERIOR CERVICAL DECOMPRESSION FUSION, CERVICAL 4-5, CERVICAL 5-6, CERVICAL 6-7, CERVICAL  7 TO THORACIC 1 WITH INSTRUMENTATION AND ALLOGRAFT; REQUEST 4 HO  . ANTERIOR LAT LUMBAR FUSION Left 11/16/2015   Procedure: LEFT SIDED LATERAL INTERBODY FUSION, LUMBAR 2-3, LUMBAR 3-4, LUMBAR 4-5 WITH INSTRUMENTATION;  Surgeon: Phylliss Bob, MD;  Location: [facility];  Service: Orthopedics;  Laterality: Left;  LEFT SIDED LATEARL INTERBODY FUSION, LUMBAR 2-3, LUMBAR 3-4, LUMBAR 4-5 WITH INSTRUMENTATION   . APPENDECTOMY    . BACK SURGERY    . BONE MARROW ASPIRATION  07/2012  . BONE MARROW BIOPSY  07/2012  . BREAST SURGERY    . CARDIAC CATHETERIZATION    . CARDIAC CATHETERIZATION N/A 01/20/2016   Procedure: Left Heart Cath and Coronary Angiography;  Surgeon: Burnell Blanks, MD;  Location: [facility];  Service: Cardiovascular;  Laterality: N/A;  . COLONOSCOPY  11/29/2010   Procedure: COLONOSCOPY;  Surgeon: Rogene Houston, MD;  [facility];  Service: Endoscopy;  Laterality: N/A;  . COLONOSCOPY N/A 02/18/2014   Procedure: COLONOSCOPY;  Surgeon: Rogene Houston, MD;  [facility];  Service: Endoscopy;  Laterality: N/A;  1030  . COLONOSCOPY N/A 02/25/2017   Procedure: COLONOSCOPY;  Surgeon: Rogene Houston, MD;  [facility];  Service: Endoscopy;  Laterality: N/A;  10:55  . CYSTOSCOPY N/A 12/09/2014   Procedure: CYSTOSCOPY;  Surgeon: Bjorn Loser, MD;  Location: [facility];  Service: Urology;  Laterality: N/A;  . ESOPHAGEAL DILATION N/A 02/25/2017   Procedure: ESOPHAGEAL DILATION;  Surgeon: Rogene Houston, MD;  [facility];  Service: Endoscopy;  Laterality: N/A;  . ESOPHAGOGASTRODUODENOSCOPY N/A 02/25/2017   Procedure: ESOPHAGOGASTRODUODENOSCOPY (EGD);  Surgeon: Rogene Houston, MD;  [facility];  Service: Endoscopy;  Laterality: N/A;  . IR GENERIC HISTORICAL  01/11/2016   IR RADIOLOGY PERIPHERAL GUIDED IV START 01/11/2016 Saverio Danker, PA-C [facility]  . IR GENERIC HISTORICAL  01/11/2016   IR US GUIDE VASC ACCESS RIGHT 01/11/2016 Saverio Danker, PA-C [facility]  . LYMPH NODE DISSECTION Right 12/30/2018   Procedure: Lymph Node Dissection;  Surgeon: Lajuana Matte, MD;  Location: [facility];  Service: Thoracic;  Laterality: Right;  . MASTECTOMY     left  . OVARIAN CYST SURGERY     x2  . POLYPECTOMY  02/25/2017   Procedure: POLYPECTOMY;  Surgeon: Rogene Houston, MD;  [facility];  Service: Endoscopy;;  . PORTACATH PLACEMENT Left 02/16/2019   Procedure: INSERTION PORT-A-CATH (CATHETER  LEFT SUBCLAVIAN);  Surgeon: Aviva Signs, MD;  Location: [facility];  Service: General;  Laterality: Left;  . SALPINGOOPHORECTOMY Bilateral 12/09/2014   Procedure: SALPINGO OOPHORECTOMY;  Surgeon: Servando Salina, MD;  Location:  [facility];  Service: Gynecology;  Laterality: Bilateral;  . TUBAL LIGATION    . VAGINAL HYSTERECTOMY N/A 12/09/2014   Procedure: HYSTERECTOMY VAGINAL;  Surgeon: Servando Salina, MD;  Location: [facility];  Service: Gynecology;  Laterality: N/A;  . VIDEO ASSISTED THORACOSCOPY (VATS)/ LOBECTOMY Right 12/30/2018   Procedure: VIDEO ASSISTED THORACOSCOPY (VATS)/RIGHT LOWER LOBE WEDGE RESECTION, RIGHT UPPER LOBECTOMY;  Surgeon: Lajuana Matte, MD;  Location: [facility];  Service: Thoracic;  Laterality: Right;  Marland Kitchen VIDEO BRONCHOSCOPY N/A 12/30/2018   Procedure: VIDEO BRONCHOSCOPY;  Surgeon: Lajuana Matte, MD;  [facility];  Service: Thoracic;  Laterality: N/A;    SOCIAL HISTORY:  Social History   Socioeconomic History  . Marital status: Divorced    Spouse name: Not on file  . Number of children: 2  . Years of education: Not  on file  . Highest education level: Not on file  Occupational History  . Not on file  Tobacco Use  . Smoking status: Former Smoker    Packs/day: 0.50    Years: 50.00    Pack years: 25.00    Types: Cigarettes    Quit date: 11/25/2018    Years since quitting: 1.7  . Smokeless tobacco: Never Used  Vaping Use  . Vaping Use: Never used  Substance and Sexual Activity  . Alcohol use: Yes    Comment: occasional  . Drug use: No  . Sexual activity: Not Currently    Birth control/protection: Post-menopausal  Other Topics Concern  . Not on file  Social History Narrative  . Not on file   Social Determinants of Health   Financial Resource Strain: Low Risk   . Difficulty of Paying Living Expenses: Not hard at all  Food Insecurity: No Food Insecurity  . Worried About Charity fundraiser in the Last Year: Never true  . Ran Out of Food in the Last Year: Never true  Transportation Needs: No Transportation Needs  . Lack of Transportation (Medical): No  . Lack of Transportation (Non-Medical): No  Physical Activity: Insufficiently Active  . Days of Exercise per Week: 4  days  . Minutes of Exercise per Session: 30 min  Stress: No Stress Concern Present  . Feeling of Stress : Not at all  Social Connections: Moderately Isolated  . Frequency of Communication with Friends and Family: More than three times a week  . Frequency of Social Gatherings with Friends and Family: More than three times a week  . Attends Religious Services: More than 4 times per year  . Active Member of Clubs or Organizations: No  . Attends Archivist Meetings: Never  . Marital Status: Divorced  Human resources officer Violence: Not At Risk  . Fear of Current or Ex-Partner: No  . Emotionally Abused: No  . Physically Abused: No  . Sexually Abused: No    FAMILY HISTORY:  Family History  Problem Relation Age of Onset  . Hypertension Mother   . Heart failure Mother   . Congestive Heart Failure Mother   . COPD Mother   . Pernicious anemia Mother   . Cancer Mother        lung  . Hypertension Father   . CAD Father   . Heart attack Father   . Hypertension Sister   . Cancer Other   . Celiac disease Other     CURRENT MEDICATIONS:  Current Outpatient Medications  Medication Sig Dispense Refill  . albuterol (PROVENTIL HFA;VENTOLIN HFA) 108 (90 Base) MCG/ACT inhaler Inhale 2 puffs into the lungs every 6 (six) hours as needed for wheezing or shortness of breath. 1 Inhaler 2  . albuterol (PROVENTIL) (2.5 MG/3ML) 0.083% nebulizer solution Take 2.5 mg by nebulization every 6 (six) hours.    . ALPRAZolam (XANAX) 0.5 MG tablet Take one tablet prior to MRIs and/or radiation oncology procedures. 6 tablet 0  . Calcium Carb-Cholecalciferol 600-800 MG-UNIT TABS Take 1 tablet by mouth 2 (two) times daily.    . Cholecalciferol (VITAMIN D-3) 1000 units CAPS Take 1,000 Units daily by mouth.     . clonazePAM (KLONOPIN) 0.5 MG tablet TAKE ONE TABLET BY MOUTH AT BEDTIME. 90 tablet 4  . cyanocobalamin (,VITAMIN B-12,) 1000 MCG/ML injection INJECT 1 ML INTO THE MUSCLE ONCE MONTHLY AS DIRECTED.  (Patient taking differently: Inject 1,000 mcg into the muscle every 30 (thirty) days.) 1 mL  5  . dexamethasone (DECADRON) 2 MG tablet Take 1 tablet (2 mg total) by mouth daily. 60 tablet 3  . dexamethasone (DECADRON) 4 MG tablet Take by mouth.    . diclofenac Sodium (VOLTAREN) 1 % GEL Apply 2 grams to hands    . ergocalciferol (VITAMIN D2) 1.25 MG (50000 UT) capsule Take by mouth.    . furosemide (LASIX) 40 MG tablet 1/2 in Am and 1 tablet in PM    . gabapentin (NEURONTIN) 600 MG tablet Take 1 tablet (600 mg total) by mouth daily. 30 tablet 6  . guaiFENesin (MUCINEX) 600 MG 12 hr tablet Take 1 tablet (600 mg total) by mouth 2 (two) times daily as needed for cough or to loosen phlegm.    . ibandronate (BONIVA) 150 MG tablet Take 150 mg by mouth every 30 (thirty) days.     Marland Kitchen ibuprofen (ADVIL) 800 MG tablet Take 1 tablet (800 mg total) by mouth every 8 (eight) hours as needed for moderate pain. 30 tablet 6  . ipratropium (ATROVENT) 0.02 % nebulizer solution Take by nebulization.    Marland Kitchen ipratropium-albuterol (DUONEB) 0.5-2.5 (3) MG/3ML SOLN Take 3 mLs by nebulization every 6 (six) hours as needed (shortness of breath). 360 mL 0  . lidocaine-prilocaine (EMLA) cream Apply a small amount to port a cath site and cover with plastic wrap 1 hour prior to chemotherapy appointments 30 g 3  . magic mouthwash w/lidocaine SOLN 10 cc swab and swallow 240 mL 1  . meclizine (ANTIVERT) 12.5 MG tablet Take by mouth.    . methocarbamol (ROBAXIN) 500 MG tablet Take 500 mg by mouth every 6 (six) hours as needed for muscle spasms.     . metoCLOPramide (REGLAN) 5 MG tablet 1 tablet before meals    . mirtazapine (REMERON) 15 MG tablet TAKE (1) TABLET BY MOUTH AT BEDTIME. 30 tablet 0  . nitroGLYCERIN (NITRODUR - DOSED IN MG/24 HR) 0.1 mg/hr patch Place 0.1 mg onto the skin daily.    . ondansetron (ZOFRAN ODT) 8 MG disintegrating tablet Take 1 tablet (8 mg total) by mouth every 8 (eight) hours as needed for nausea or vomiting.  60 tablet 2  . oxyCODONE (OXY IR/ROXICODONE) 5 MG immediate release tablet Take 1 tablet (5 mg total) by mouth every 8 (eight) hours. 90 tablet 0  . OXYGEN Inhale 3 L into the lungs daily.    . pantoprazole (PROTONIX) 40 MG tablet TAKE ONE TABLET BY MOUTH TWICE DAILY. 60 tablet 0  . polyethylene glycol (MIRALAX / GLYCOLAX) 17 g packet Take 17 g by mouth daily as needed for mild constipation.    . potassium chloride SA (KLOR-CON) 20 MEQ tablet Take by mouth.    . potassium chloride SA (KLOR-CON) 20 MEQ tablet TAKE 1 TABLET BY MOUTH 3 TIMES DAILY 60 tablet 0  . prochlorperazine (COMPAZINE) 10 MG tablet 1 tablet as needed    . rizatriptan (MAXALT) 10 MG tablet Take 10 mg by mouth 2 (two) times daily. 1-2 times daily May repeat in 2 hours if needed    . rosuvastatin (CRESTOR) 40 MG tablet Take 40 mg by mouth at bedtime.    . sucralfate (CARAFATE) 1 g tablet Take 1 g by mouth 3 (three) times daily.    . traMADol (ULTRAM) 50 MG tablet Take 50 mg by mouth every 6 (six) hours as needed.    Marland Kitchen aspirin EC 81 MG tablet Take 81 mg by mouth daily.     No current facility-administered medications  for this visit.   Facility-Administered Medications Ordered in Other Visits  Medication Dose Route Frequency Provider Last Rate Last Admin  . sodium chloride flush (NS) 0.9 % injection 10 mL  10 mL Intravenous PRN Lockamy, Randi L, NP-C   10 mL at 12/22/19 1240    ALLERGIES:  Allergies  Allergen Reactions  . Penicillins Hives and Rash    Did it involve swelling of the face/tongue/throat, SOB, or low BP? No Did it involve sudden or severe rash/hives, skin peeling, or any reaction on the inside of your mouth or nose? No Did you need to seek medical attention at a hospital or doctor's office? No When did it last happen?5-10 year If all above answers are "NO", may proceed with cephalosporin use.      PHYSICAL EXAM:  Performance status (ECOG): 1 - Symptomatic but completely ambulatory  Vitals:    08/08/20 1001  BP: 131/81  Pulse: 76  Resp: 18  Temp: 97.8 F (36.6 C)  SpO2: 96%   Wt Readings from Last 3 Encounters:  08/08/20 104 lb 4.4 oz (47.3 kg)  07/18/20 103 lb 9.6 oz (47 kg)  06/27/20 99 lb 6.4 oz (45.1 kg)   Physical Exam Vitals reviewed.  Constitutional:      Appearance: Normal appearance.     Comments: In wheelchair  Cardiovascular:     Rate and Rhythm: Normal rate and regular rhythm.     Pulses: Normal pulses.     Heart sounds: Normal heart sounds.  Pulmonary:     Effort: Pulmonary effort is normal.     Breath sounds: Normal breath sounds.  Chest:     Comments: Port-a-Cath in L chest Abdominal:     Palpations: Abdomen is soft. There is no mass.     Tenderness: There is no abdominal tenderness.  Musculoskeletal:     Right lower leg: No edema.     Left lower leg: No edema.  Neurological:     General: No focal deficit present.     Mental Status: She is alert and oriented to person, place, and time.  Psychiatric:        Mood and Affect: Mood normal.        Behavior: Behavior normal.     LABORATORY DATA:  I have reviewed the labs as listed.  CBC Latest Ref Rng & Units 08/08/2020 07/18/2020 06/27/2020  WBC 4.0 - 10.5 K/uL 13.4(H) 10.0 6.9  Hemoglobin 12.0 - 15.0 g/dL 9.6(L) 10.1(L) 11.1(L)  Hematocrit 36.0 - 46.0 % 30.6(L) 32.4(L) 35.2(L)  Platelets 150 - 400 K/uL 477(H) 474(H) 697(H)   CMP Latest Ref Rng & Units 08/08/2020 07/18/2020 06/27/2020  Glucose 70 - 99 mg/dL 174(H) 151(H) 95  BUN 8 - 23 mg/dL $Remove'16 22 20  'MttktOm$ Creatinine 0.44 - 1.00 mg/dL 1.25(H) 1.30(H) 0.92  Sodium 135 - 145 mmol/L 137 137 139  Potassium 3.5 - 5.1 mmol/L 3.7 2.8(L) 4.0  Chloride 98 - 111 mmol/L 100 97(L) 105  CO2 22 - 32 mmol/L $RemoveB'26 30 25  'kJlOdeaX$ Calcium 8.9 - 10.3 mg/dL 8.7(L) 8.1(L) 8.7(L)  Total Protein 6.5 - 8.1 g/dL 6.4(L) 6.3(L) 6.8  Total Bilirubin 0.3 - 1.2 mg/dL 0.5 0.5 0.3  Alkaline Phos 38 - 126 U/L 45 46 59  AST 15 - 41 U/L $Remo'27 24 22  'AAFij$ ALT 0 - 44 U/L $Remo'22 19 19    'HbnxW$ DIAGNOSTIC  IMAGING:  I have independently reviewed the scans and discussed with the patient. No results found.   ASSESSMENT:  1.Extensive stage  small cell lung cancer: -Right upper lobectomy on 12/30/2018, pathology-4.2 cm small cell lung cancer, invading visceral pleura, 1/3 lymph nodes positive, LVI positive, metastatic carcinoma in 211 or lymph nodes, right chest wall biopsy consistent with small cell carcinoma. -30 Gy of radiation in 10 fractions from 01/28/2019 through 02/10/2019. -4 cycles of carboplatin and etoposide from 03/09/2019 through 05/11/2019. -PCI completed on 07/31/2019. -We reviewed MRI of the brain on 09/15/2019 with no evidence of brain meta stasis. Concern for hypointense appearance at the C3 vertebral body and left articular process. -CT chest on 09/15/2019 shows numerous new small solid irregular pulmonary nodules scattered throughout both lungs, largest 7 mm on the right lower lobe. Tiny loculated anterior right pleural effusion decreased. Stable mild subcarinal adenopathy. Bilateral stable adrenal adenomas. -MRI of the C-spine on 10/13/2019 showed marrow edema and enhancement involving C3 vertebral body and left articular process without discrete bone lesion. This was thought to be degenerative. -PET scan on 09/28/2019 shows bilateral lung nodules, below PET resolution. Small right-sided nodular densities demonstrating low-level hypermetabolism. Mild hypermetabolic corresponding to a normal-sized subcarinal lymph node. Multifocal right pleural hypermetabolism in the setting of pleural thickening and prior right upper lobectomy, indeterminate. No hypermetabolic extrathoracic disease. -PET scan on 11/24/2019 shows progressive increased soft tissue within the right lung along the suture margins, positive right paratracheal lymph node, small subcapsular focus of increased radiotracer uptake overlying the anterolateral right hepatic lobe concerning for tumor. -PET scan on 05/26/2020 with  progression with bulky hypermetabolic subcarinal and right hilar lymphadenopathy. Hypermetabolic lymph nodes in the right paratracheal and precarinal stations.   PLAN:  1.Extensive stage small cell lung cancer: -She did not experience any major side effects after last cycle of chemotherapy.  She had to reschedule her PET scan. - Reviewed her labs today which showed normal LFTs.  White count and platelets are adequate to proceed with next cycle. - Recommend cycle 4 topotecan 1.2 mg per metered squared for 5 days. - She will have PET scan done prior to next visit. - She reported cramping in hands, legs and feet, happens randomly and lasts few hours.  Electrolytes are normal.  Recommended mustard or vinegar.  2. Essential thrombocytosis: -Hydroxyurea on hold since chemotherapy started.  Platelet count is 477.  3.  Normocytic anemia: -Hemoglobin today is 9.6 with MCV of 96.  This is from myelosuppression.  4. Weight loss: -She will go back to dexamethasone 2 mg daily in the mornings after the taper dose is done.  Continue Remeron daily.  5. Mid back and sternal pain: -Continue oxycodone alternating with tramadol.  Rate is well controlled.  6. Hypokalemia: -Continue oral potassium supplements.  Potassium today is 3.7.  7. Brain metastasis: -She completed 3 SRS treatments. - She is on dexamethasone taper, 4 mg daily until 08/12/2020.  8.  Sleep problem: - Currently taking Klonopin 0.5 mg at bedtime.  She reports worsening sleep problems since dexamethasone dose was increased. - Will increase Klonopin to 1 and half tablets at bedtime as needed.   Orders placed this encounter:  No orders of the defined types were placed in this encounter.    Derek Jack, MD [facility] [phone]   I, Milinda Antis, am acting as a scribe for Dr. Sanda Linger.  I, Derek Jack MD, have reviewed the above documentation for accuracy and  completeness, and I agree with the above.

## 2020-08-08 NOTE — Progress Notes (Signed)
Nutrition Follow-up:  Patient with lung cancer receiving Topotecan and completed SRS radiation on 3/14.  Met with patient and daughter in infusion. She reports appetite continues to improve. Patient reports yesterday was the best day she has had with intake in a long time. She ate 2 "big bowls" of cream of chicken/corn soup with crackers, black eyed peas, small bag of Cheetos, 4 peanut butter crackers, 4 sugar wafers, 1/2 of French dip sandwich, 1/2 banana split, handful of caramel candies, drank a cup of hot chocolate, some pepsi, and 2 bottles of water. Patient has not eaten today, reports feeling hungry. Daughter is leaving to pick up stew and [place] from [place] for patient to eat during infusion. Patient denies constipation, diarrhea, altered taste. She continues to have nausea, reports Zofran is working well.    Medications: reviewed  Labs: Glucose 174, Cr 1.25  Anthropometrics: Weights continue to trend up, today pt weighed 104 lb 4.4 oz  4/4 - 103 lb 9.6 oz 3/14 - 99 lb 6.4 oz 2/21 - 94 lb 3.2 oz   NUTRITION DIAGNOSIS: Inadequate oral intake improved   INTERVENTION:  Continue appetite stimulants Continue Zofran prior to meals Encouraged continued high calorie, high protein foods    MONITORING, EVALUATION, GOAL: weight trends, intake   NEXT VISIT: Thursday May 19 in infusion

## 2020-08-08 NOTE — Progress Notes (Signed)
Patient has been assessed by Dr Delton Coombes.  Labs reviewed.  [place] for treatment next 5 days and follow up in 3 weeks.

## 2020-08-08 NOTE — Patient Instructions (Signed)
[facility]  Discharge Instructions: Thank you for choosing [facility] to provide your oncology and hematology care.  If you have a lab appointment with the [facility], please come in thru the Main Entrance and check in at the main information desk.  Wear comfortable clothing and clothing appropriate for easy access to any Portacath or PICC line.   We strive to give you quality time with your provider. You may need to reschedule your appointment if you arrive late (15 or more minutes).  Arriving late affects you and other patients whose appointments are after yours.  Also, if you miss three or more appointments without notifying the office, you may be dismissed from the clinic at the provider's discretion.      For prescription refill requests, have your pharmacy contact our office and allow 72 hours for refills to be completed.    Today you received the following chemotherapy agents Topotecan.   To help prevent nausea and vomiting after your treatment, we encourage you to take your nausea medication as directed.  BELOW ARE SYMPTOMS THAT SHOULD BE REPORTED IMMEDIATELY: . *FEVER GREATER THAN 100.4 F (38 C) OR HIGHER . *CHILLS OR SWEATING . *NAUSEA AND VOMITING THAT IS NOT CONTROLLED WITH YOUR NAUSEA MEDICATION . *UNUSUAL SHORTNESS OF BREATH . *UNUSUAL BRUISING OR BLEEDING . *URINARY PROBLEMS (pain or burning when urinating, or frequent urination) . *BOWEL PROBLEMS (unusual diarrhea, constipation, pain near the anus) . TENDERNESS IN MOUTH AND THROAT WITH OR WITHOUT PRESENCE OF ULCERS (sore throat, sores in mouth, or a toothache) . UNUSUAL RASH, SWELLING OR PAIN  . UNUSUAL VAGINAL DISCHARGE OR ITCHING   Items with * indicate a potential emergency and should be followed up as soon as possible or go to the Emergency Department if any problems should occur.  Please show the CHEMOTHERAPY ALERT CARD or IMMUNOTHERAPY ALERT CARD at check-in to the Emergency  Department and triage nurse.  Should you have questions after your visit or need to cancel or reschedule your appointment, please contact [facility] [phone]  and follow the prompts.  Office hours are 8:00 a.m. to 4:30 p.m. Monday - Friday. Please note that voicemails left after 4:00 p.m. may not be returned until the following business day.  We are closed weekends and major holidays. You have access to a nurse at all times for urgent questions. Please call the main number to the clinic [phone] and follow the prompts.  For any non-urgent questions, you may also contact your provider using MyChart. We now offer e-Visits for anyone 52 and older to request care online for non-urgent symptoms. For details visit [email or web address].   Also download the MyChart app! Go to the app store, search "MyChart", open the app, select [facility], and log in with your MyChart username and password.  Due to Covid, a mask is required upon entering the hospital/clinic. If you do not have a mask, one will be given to you upon arrival. For doctor visits, patients may have 1 support person aged 85 or older with them. For treatment visits, patients cannot have anyone with them due to current Covid guidelines and our immunocompromised population.

## 2020-08-09 ENCOUNTER — Encounter (HOSPITAL_COMMUNITY): Payer: Self-pay

## 2020-08-09 ENCOUNTER — Inpatient Hospital Stay (HOSPITAL_COMMUNITY): Payer: Medicare Other

## 2020-08-09 VITALS — BP 120/75 | HR 69 | Temp 96.6°F | Resp 16

## 2020-08-09 DIAGNOSIS — Z5111 Encounter for antineoplastic chemotherapy: Secondary | ICD-10-CM | POA: Diagnosis not present

## 2020-08-09 DIAGNOSIS — C3491 Malignant neoplasm of unspecified part of right bronchus or lung: Secondary | ICD-10-CM

## 2020-08-09 MED ORDER — SODIUM CHLORIDE 0.9% FLUSH
10.0000 mL | INTRAVENOUS | Status: DC | PRN
  Administered 2020-08-09: 10 mL

## 2020-08-09 MED ORDER — HEPARIN SOD (PORK) LOCK FLUSH 100 UNIT/ML IV SOLN
500.0000 [IU] | Freq: Once | INTRAVENOUS | Status: AC | PRN
  Administered 2020-08-09: 500 [IU]

## 2020-08-09 MED ORDER — SODIUM CHLORIDE 0.9 % IV SOLN
Freq: Once | INTRAVENOUS | Status: AC
Start: 2020-08-09 — End: 2020-08-09

## 2020-08-09 MED ORDER — TOPOTECAN HCL CHEMO INJECTION 4 MG
1.2000 mg/m2 | Freq: Once | INTRAVENOUS | Status: AC
  Administered 2020-08-09: 1.7 mg via INTRAVENOUS
  Filled 2020-08-09: qty 1.7

## 2020-08-09 MED ORDER — SODIUM CHLORIDE 0.9 % IV SOLN
Freq: Once | INTRAVENOUS | Status: AC
  Administered 2020-08-09: 8 mg via INTRAVENOUS
  Filled 2020-08-09: qty 8

## 2020-08-09 NOTE — Patient Instructions (Signed)
[facility]  Discharge Instructions: Thank you for choosing [facility] to provide your oncology and hematology care.  If you have a lab appointment with the [facility], please come in thru the Main Entrance and check in at the main information desk.  Wear comfortable clothing and clothing appropriate for easy access to any Portacath or PICC line.   We strive to give you quality time with your provider. You may need to reschedule your appointment if you arrive late (15 or more minutes).  Arriving late affects you and other patients whose appointments are after yours.  Also, if you miss three or more appointments without notifying the office, you may be dismissed from the clinic at the provider's discretion.      For prescription refill requests, have your pharmacy contact our office and allow 72 hours for refills to be completed.    Today you received the following chemotherapy and/or immunotherapy agents Topotecan.      To help prevent nausea and vomiting after your treatment, we encourage you to take your nausea medication as directed.  BELOW ARE SYMPTOMS THAT SHOULD BE REPORTED IMMEDIATELY: . *FEVER GREATER THAN 100.4 F (38 C) OR HIGHER . *CHILLS OR SWEATING . *NAUSEA AND VOMITING THAT IS NOT CONTROLLED WITH YOUR NAUSEA MEDICATION . *UNUSUAL SHORTNESS OF BREATH . *UNUSUAL BRUISING OR BLEEDING . *URINARY PROBLEMS (pain or burning when urinating, or frequent urination) . *BOWEL PROBLEMS (unusual diarrhea, constipation, pain near the anus) . TENDERNESS IN MOUTH AND THROAT WITH OR WITHOUT PRESENCE OF ULCERS (sore throat, sores in mouth, or a toothache) . UNUSUAL RASH, SWELLING OR PAIN  . UNUSUAL VAGINAL DISCHARGE OR ITCHING   Items with * indicate a potential emergency and should be followed up as soon as possible or go to the Emergency Department if any problems should occur.  Please show the CHEMOTHERAPY ALERT CARD or IMMUNOTHERAPY ALERT CARD at check-in to  the Emergency Department and triage nurse.  Should you have questions after your visit or need to cancel or reschedule your appointment, please contact [facility] [phone]  and follow the prompts.  Office hours are 8:00 a.m. to 4:30 p.m. Monday - Friday. Please note that voicemails left after 4:00 p.m. may not be returned until the following business day.  We are closed weekends and major holidays. You have access to a nurse at all times for urgent questions. Please call the main number to the clinic [phone] and follow the prompts.  For any non-urgent questions, you may also contact your provider using MyChart. We now offer e-Visits for anyone 72 and older to request care online for non-urgent symptoms. For details visit [email or web address].   Also download the MyChart app! Go to the app store, search "MyChart", open the app, select [facility], and log in with your MyChart username and password.  Due to Covid, a mask is required upon entering the hospital/clinic. If you do not have a mask, one will be given to you upon arrival. For doctor visits, patients may have 1 support person aged 9 or older with them. For treatment visits, patients cannot have anyone with them due to current Covid guidelines and our immunocompromised population.

## 2020-08-09 NOTE — Progress Notes (Signed)
Patient tolerated chemotherapy with no complaints voiced. Side effects with management reviewed understanding verbalized. Port site clean and dry with no bruising or swelling noted at site. Good blood return noted before and after administration of chemotherapy. Dressing reinforced. Patient left in satisfactory condition with VSS and no s/s of distress noted.

## 2020-08-10 ENCOUNTER — Inpatient Hospital Stay (HOSPITAL_COMMUNITY): Payer: Medicare Other

## 2020-08-10 ENCOUNTER — Encounter (HOSPITAL_COMMUNITY): Payer: Self-pay

## 2020-08-10 ENCOUNTER — Other Ambulatory Visit: Payer: Self-pay

## 2020-08-10 VITALS — BP 105/69 | HR 91 | Temp 96.9°F | Resp 18

## 2020-08-10 DIAGNOSIS — Z5111 Encounter for antineoplastic chemotherapy: Secondary | ICD-10-CM | POA: Diagnosis not present

## 2020-08-10 DIAGNOSIS — C3491 Malignant neoplasm of unspecified part of right bronchus or lung: Secondary | ICD-10-CM

## 2020-08-10 MED ORDER — SODIUM CHLORIDE 0.9% FLUSH
10.0000 mL | INTRAVENOUS | Status: DC | PRN
  Administered 2020-08-10: 10 mL

## 2020-08-10 MED ORDER — TOPOTECAN HCL CHEMO INJECTION 4 MG
1.2000 mg/m2 | Freq: Once | INTRAVENOUS | Status: AC
  Administered 2020-08-10: 1.7 mg via INTRAVENOUS
  Filled 2020-08-10: qty 1.7

## 2020-08-10 MED ORDER — HEPARIN SOD (PORK) LOCK FLUSH 100 UNIT/ML IV SOLN
500.0000 [IU] | Freq: Once | INTRAVENOUS | Status: AC | PRN
  Administered 2020-08-10: 500 [IU]

## 2020-08-10 MED ORDER — SODIUM CHLORIDE 0.9 % IV SOLN
Freq: Once | INTRAVENOUS | Status: AC
Start: 2020-08-10 — End: 2020-08-10

## 2020-08-10 MED ORDER — ONDANSETRON HCL 40 MG/20ML IJ SOLN
Freq: Once | INTRAMUSCULAR | Status: AC
  Administered 2020-08-10: 8 mg via INTRAVENOUS
  Filled 2020-08-10: qty 8

## 2020-08-10 NOTE — Patient Instructions (Signed)
[facility]  Discharge Instructions: Thank you for choosing [facility] to provide your oncology and hematology care.  If you have a lab appointment with the [facility], please come in thru the Main Entrance and check in at the main information desk.  Wear comfortable clothing and clothing appropriate for easy access to any Portacath or PICC line.   We strive to give you quality time with your provider. You may need to reschedule your appointment if you arrive late (15 or more minutes).  Arriving late affects you and other patients whose appointments are after yours.  Also, if you miss three or more appointments without notifying the office, you may be dismissed from the clinic at the provider's discretion.      For prescription refill requests, have your pharmacy contact our office and allow 72 hours for refills to be completed.    Today you received the following chemotherapy and/or immunotherapy agents: Topotecan      To help prevent nausea and vomiting after your treatment, we encourage you to take your nausea medication as directed.  BELOW ARE SYMPTOMS THAT SHOULD BE REPORTED IMMEDIATELY: . *FEVER GREATER THAN 100.4 F (38 C) OR HIGHER . *CHILLS OR SWEATING . *NAUSEA AND VOMITING THAT IS NOT CONTROLLED WITH YOUR NAUSEA MEDICATION . *UNUSUAL SHORTNESS OF BREATH . *UNUSUAL BRUISING OR BLEEDING . *URINARY PROBLEMS (pain or burning when urinating, or frequent urination) . *BOWEL PROBLEMS (unusual diarrhea, constipation, pain near the anus) . TENDERNESS IN MOUTH AND THROAT WITH OR WITHOUT PRESENCE OF ULCERS (sore throat, sores in mouth, or a toothache) . UNUSUAL RASH, SWELLING OR PAIN  . UNUSUAL VAGINAL DISCHARGE OR ITCHING   Items with * indicate a potential emergency and should be followed up as soon as possible or go to the Emergency Department if any problems should occur.  Please show the CHEMOTHERAPY ALERT CARD or IMMUNOTHERAPY ALERT CARD at check-in to  the Emergency Department and triage nurse.  Should you have questions after your visit or need to cancel or reschedule your appointment, please contact [facility] [phone]  and follow the prompts.  Office hours are 8:00 a.m. to 4:30 p.m. Monday - Friday. Please note that voicemails left after 4:00 p.m. may not be returned until the following business day.  We are closed weekends and major holidays. You have access to a nurse at all times for urgent questions. Please call the main number to the clinic [phone] and follow the prompts.  For any non-urgent questions, you may also contact your provider using MyChart. We now offer e-Visits for anyone 72 and older to request care online for non-urgent symptoms. For details visit [email or web address].   Also download the MyChart app! Go to the app store, search "MyChart", open the app, select [facility], and log in with your MyChart username and password.  Due to Covid, a mask is required upon entering the hospital/clinic. If you do not have a mask, one will be given to you upon arrival. For doctor visits, patients may have 1 support person aged 72 or older with them. For treatment visits, patients cannot have anyone with them due to current Covid guidelines and our immunocompromised population.

## 2020-08-10 NOTE — Progress Notes (Signed)
Patient tolerated chemotherapy with no complaints voiced. Side effects with management reviewed understanding verbalized. Port site clean and dry with no bruising or swelling noted at site. Good blood return noted before and after administration of chemotherapy. Patient left in satisfactory condition with VSS and no s/s of distress noted.

## 2020-08-11 ENCOUNTER — Inpatient Hospital Stay (HOSPITAL_COMMUNITY): Payer: Medicare Other

## 2020-08-11 ENCOUNTER — Encounter (HOSPITAL_COMMUNITY): Payer: Self-pay

## 2020-08-11 VITALS — BP 105/58 | HR 70 | Temp 97.6°F | Resp 16

## 2020-08-11 DIAGNOSIS — Z5111 Encounter for antineoplastic chemotherapy: Secondary | ICD-10-CM | POA: Diagnosis not present

## 2020-08-11 DIAGNOSIS — C3491 Malignant neoplasm of unspecified part of right bronchus or lung: Secondary | ICD-10-CM

## 2020-08-11 MED ORDER — SODIUM CHLORIDE 0.9% FLUSH
10.0000 mL | INTRAVENOUS | Status: DC | PRN
  Administered 2020-08-11: 10 mL

## 2020-08-11 MED ORDER — ONDANSETRON HCL 40 MG/20ML IJ SOLN
8.0000 mg | Freq: Once | INTRAMUSCULAR | Status: AC
  Administered 2020-08-11: 8 mg via INTRAVENOUS
  Filled 2020-08-11: qty 8

## 2020-08-11 MED ORDER — HEPARIN SOD (PORK) LOCK FLUSH 100 UNIT/ML IV SOLN
500.0000 [IU] | Freq: Once | INTRAVENOUS | Status: AC | PRN
  Administered 2020-08-11: 500 [IU]

## 2020-08-11 MED ORDER — SODIUM CHLORIDE 0.9 % IV SOLN
Freq: Once | INTRAVENOUS | Status: AC
Start: 2020-08-11 — End: 2020-08-11

## 2020-08-11 MED ORDER — TOPOTECAN HCL CHEMO INJECTION 4 MG
1.2000 mg/m2 | Freq: Once | INTRAVENOUS | Status: AC
  Administered 2020-08-11: 1.7 mg via INTRAVENOUS
  Filled 2020-08-11: qty 1.7

## 2020-08-11 NOTE — Patient Instructions (Signed)
Ashley [name]  Discharge Instructions: Thank you for choosing [facility] to provide your oncology and hematology care.  If you have a lab appointment with the [facility], please come in thru the Main Entrance and check in at the main information desk.  Wear comfortable clothing and clothing appropriate for easy access to any Portacath or PICC line.   We strive to give you quality time with your provider. You may need to reschedule your appointment if you arrive late (15 or more minutes).  Arriving late affects you and other patients whose appointments are after yours.  Also, if you miss three or more appointments without notifying the office, you may be dismissed from the clinic at the provider's discretion.      For prescription refill requests, have your pharmacy contact our office and allow 72 hours for refills to be completed.    Today you received the following chemotherapy and/or immunotherapy agents: Topotecan. Return as scheduled.   To help prevent nausea and vomiting after your treatment, we encourage you to take your nausea medication as directed.  BELOW ARE SYMPTOMS THAT SHOULD BE REPORTED IMMEDIATELY: . *FEVER GREATER THAN 100.4 F (38 C) OR HIGHER . *CHILLS OR SWEATING . *NAUSEA AND VOMITING THAT IS NOT CONTROLLED WITH YOUR NAUSEA MEDICATION . *UNUSUAL SHORTNESS OF BREATH . *UNUSUAL BRUISING OR BLEEDING . *URINARY PROBLEMS (pain or burning when urinating, or frequent urination) . *BOWEL PROBLEMS (unusual diarrhea, constipation, pain near the anus) . TENDERNESS IN MOUTH AND THROAT WITH OR WITHOUT PRESENCE OF ULCERS (sore throat, sores in mouth, or a toothache) . UNUSUAL RASH, SWELLING OR PAIN  . UNUSUAL VAGINAL DISCHARGE OR ITCHING   Items with * indicate a potential emergency and should be followed up as soon as possible or go to the Emergency Department if any problems should occur.  Please show the CHEMOTHERAPY ALERT CARD or IMMUNOTHERAPY ALERT  CARD at check-in to the Emergency Department and triage nurse.  Should you have questions after your visit or need to cancel or reschedule your appointment, please contact [facility] [phone]  and follow the prompts.  Office hours are 8:00 a.m. to 4:30 p.m. Monday - Friday. Please note that voicemails left after 4:00 p.m. may not be returned until the following business day.  We are closed weekends and major holidays. You have access to a nurse at all times for urgent questions. Please call the main number to the clinic [phone] and follow the prompts.  For any non-urgent questions, you may also contact your provider using MyChart. We now offer e-Visits for anyone 40 and older to request care online for non-urgent symptoms. For details visit [email or web address].   Also download the MyChart app! Go to the app store, search "MyChart", open the app, select [facility], and log in with your MyChart username and password.  Due to Covid, a mask is required upon entering the hospital/clinic. If you do not have a mask, one will be given to you upon arrival. For doctor visits, patients may have 1 support person aged 51 or older with them. For treatment visits, patients cannot have anyone with them due to current Covid guidelines and our immunocompromised population.

## 2020-08-11 NOTE — Progress Notes (Signed)
Patient tolerated Topotecan infusion with no complaints voiced. Side effects with management reviewed understanding verbalized. Port site clean and dry with no bruising or swelling noted at site. Good blood return noted before and after administration of chemotherapy. Patient left in satisfactory condition with VSS and no s/s of distress noted.

## 2020-08-12 ENCOUNTER — Other Ambulatory Visit: Payer: Self-pay

## 2020-08-12 ENCOUNTER — Inpatient Hospital Stay (HOSPITAL_COMMUNITY): Payer: Medicare Other

## 2020-08-12 ENCOUNTER — Encounter (HOSPITAL_COMMUNITY): Payer: Self-pay

## 2020-08-12 VITALS — BP 107/75 | HR 74 | Temp 97.4°F | Resp 18

## 2020-08-12 DIAGNOSIS — Z5111 Encounter for antineoplastic chemotherapy: Secondary | ICD-10-CM | POA: Diagnosis not present

## 2020-08-12 DIAGNOSIS — C3491 Malignant neoplasm of unspecified part of right bronchus or lung: Secondary | ICD-10-CM

## 2020-08-12 MED ORDER — TOPOTECAN HCL CHEMO INJECTION 4 MG
1.2000 mg/m2 | Freq: Once | INTRAVENOUS | Status: AC
  Administered 2020-08-12: 1.7 mg via INTRAVENOUS
  Filled 2020-08-12: qty 1.7

## 2020-08-12 MED ORDER — HEPARIN SOD (PORK) LOCK FLUSH 100 UNIT/ML IV SOLN
500.0000 [IU] | Freq: Once | INTRAVENOUS | Status: AC | PRN
  Administered 2020-08-12: 500 [IU]

## 2020-08-12 MED ORDER — SODIUM CHLORIDE 0.9 % IV SOLN: Freq: Once | INTRAVENOUS | Status: AC

## 2020-08-12 MED ORDER — SODIUM CHLORIDE 0.9 % IV SOLN
8.0000 mg | Freq: Once | INTRAVENOUS | Status: AC
  Administered 2020-08-12: 8 mg via INTRAVENOUS
  Filled 2020-08-12: qty 8

## 2020-08-12 MED ORDER — SODIUM CHLORIDE 0.9% FLUSH
10.0000 mL | INTRAVENOUS | Status: DC | PRN
  Administered 2020-08-12: 10 mL

## 2020-08-12 NOTE — Progress Notes (Signed)
Topotecan given today per MD orders. Tolerated infusion without adverse affects. Vital signs stable. No complaints at this time. Discharged from clinic ambulatory in stable condition. Alert and oriented x 3. F/U with [facility] as scheduled.

## 2020-08-12 NOTE — Patient Instructions (Signed)
[facility]  Discharge Instructions: Thank you for choosing [facility] to provide your oncology and hematology care.  If you have a lab appointment with the [facility], please come in thru the Main Entrance and check in at the main information desk.  Wear comfortable clothing and clothing appropriate for easy access to any Portacath or PICC line.   We strive to give you quality time with your provider. You may need to reschedule your appointment if you arrive late (15 or more minutes).  Arriving late affects you and other patients whose appointments are after yours.  Also, if you miss three or more appointments without notifying the office, you may be dismissed from the clinic at the provider's discretion.      For prescription refill requests, have your pharmacy contact our office and allow 72 hours for refills to be completed.    Today you received the following chemotherapy and/or immunotherapy agents Topotecan.    To help prevent nausea and vomiting after your treatment, we encourage you to take your nausea medication as directed.  BELOW ARE SYMPTOMS THAT SHOULD BE REPORTED IMMEDIATELY: . *FEVER GREATER THAN 100.4 F (38 C) OR HIGHER . *CHILLS OR SWEATING . *NAUSEA AND VOMITING THAT IS NOT CONTROLLED WITH YOUR NAUSEA MEDICATION . *UNUSUAL SHORTNESS OF BREATH . *UNUSUAL BRUISING OR BLEEDING . *URINARY PROBLEMS (pain or burning when urinating, or frequent urination) . *BOWEL PROBLEMS (unusual diarrhea, constipation, pain near the anus) . TENDERNESS IN MOUTH AND THROAT WITH OR WITHOUT PRESENCE OF ULCERS (sore throat, sores in mouth, or a toothache) . UNUSUAL RASH, SWELLING OR PAIN  . UNUSUAL VAGINAL DISCHARGE OR ITCHING   Items with * indicate a potential emergency and should be followed up as soon as possible or go to the Emergency Department if any problems should occur.  Please show the CHEMOTHERAPY ALERT CARD or IMMUNOTHERAPY ALERT CARD at check-in to  the Emergency Department and triage nurse.  Should you have questions after your visit or need to cancel or reschedule your appointment, please contact [facility] [phone]  and follow the prompts.  Office hours are 8:00 a.m. to 4:30 p.m. Monday - Friday. Please note that voicemails left after 4:00 p.m. may not be returned until the following business day.  We are closed weekends and major holidays. You have access to a nurse at all times for urgent questions. Please call the main number to the clinic [phone] and follow the prompts.  For any non-urgent questions, you may also contact your provider using MyChart. We now offer e-Visits for anyone 41 and older to request care online for non-urgent symptoms. For details visit [email or web address].   Also download the MyChart app! Go to the app store, search "MyChart", open the app, select [facility], and log in with your MyChart username and password.  Due to Covid, a mask is required upon entering the hospital/clinic. If you do not have a mask, one will be given to you upon arrival. For doctor visits, patients may have 1 support person aged 53 or older with them. For treatment visits, patients cannot have anyone with them due to current Covid guidelines and our immunocompromised population.

## 2020-08-15 ENCOUNTER — Other Ambulatory Visit: Payer: Self-pay

## 2020-08-15 ENCOUNTER — Ambulatory Visit (HOSPITAL_COMMUNITY)
Admission: RE | Admit: 2020-08-15 | Discharge: 2020-08-15 | Disposition: A | Discharge status: Home / Self Care | Payer: Medicare Other | Source: Ambulatory Visit | Attending: Hematology | Admitting: Hematology

## 2020-08-15 DIAGNOSIS — Z79899 Other long term (current) drug therapy: Secondary | ICD-10-CM | POA: Diagnosis not present

## 2020-08-15 DIAGNOSIS — Z9012 Acquired absence of left breast and nipple: Secondary | ICD-10-CM | POA: Insufficient documentation

## 2020-08-15 DIAGNOSIS — C3491 Malignant neoplasm of unspecified part of right bronchus or lung: Secondary | ICD-10-CM | POA: Diagnosis present

## 2020-08-15 DIAGNOSIS — Z902 Acquired absence of lung [part of]: Secondary | ICD-10-CM | POA: Insufficient documentation

## 2020-08-15 LAB — GLUCOSE, CAPILLARY: Glucose-Capillary: 96 mg/dL (ref 70–99)

## 2020-08-15 MED ORDER — FLUDEOXYGLUCOSE F - 18 (FDG) INJECTION
5.4000 | Freq: Once | INTRAVENOUS | Status: AC | PRN
  Administered 2020-08-15: 5.38 via INTRAVENOUS

## 2020-08-18 NOTE — Progress Notes (Signed)
  Patient Name: Ashley [name] MRN: [id number] DOB: 09-Aug-1950 Referring Physician: Derek Jack Date of Service: 06/27/2020 [facility], [place]                                                        End Of Treatment Note  Diagnoses: C79.31-Secondary malignant neoplasm of brain  Cancer Staging: STAGE IV  Intent: Palliative  Radiation Treatment Dates: 06/22/2020 through 06/27/2020 Site Technique Total Dose (Gy) Dose per Fx (Gy) Completed Fx Beam Energies  Brain: Brain_SRS IMRT 27/27 9 3/3 6XFFF   Narrative: The patient tolerated radiation therapy relatively well.   Plan: The patient will follow-up with radiation oncology in 34mo. Dexamethasone increased to 4mg  BID at completion of SRS due to persistent HAs. Written Steroid taper provided to patient and family.  -----------------------------------  Eppie Gibson, MD

## 2020-08-19 ENCOUNTER — Ambulatory Visit
Admission: RE | Admit: 2020-08-19 | Discharge: 2020-08-19 | Disposition: A | Discharge status: Home / Self Care | Payer: Medicare Other | Source: Ambulatory Visit | Attending: Radiation Oncology | Admitting: Radiation Oncology

## 2020-08-19 ENCOUNTER — Other Ambulatory Visit: Payer: Self-pay

## 2020-08-19 VITALS — BP 102/68 | HR 79 | Temp 96.8°F | Resp 18 | Ht 62.0 in | Wt 103.1 lb

## 2020-08-19 DIAGNOSIS — R252 Cramp and spasm: Secondary | ICD-10-CM | POA: Insufficient documentation

## 2020-08-19 DIAGNOSIS — D3501 Benign neoplasm of right adrenal gland: Secondary | ICD-10-CM | POA: Insufficient documentation

## 2020-08-19 DIAGNOSIS — Z79899 Other long term (current) drug therapy: Secondary | ICD-10-CM | POA: Insufficient documentation

## 2020-08-19 DIAGNOSIS — J432 Centrilobular emphysema: Secondary | ICD-10-CM | POA: Diagnosis not present

## 2020-08-19 DIAGNOSIS — Z7952 Long term (current) use of systemic steroids: Secondary | ICD-10-CM | POA: Diagnosis not present

## 2020-08-19 DIAGNOSIS — N281 Cyst of kidney, acquired: Secondary | ICD-10-CM | POA: Insufficient documentation

## 2020-08-19 DIAGNOSIS — C7931 Secondary malignant neoplasm of brain: Secondary | ICD-10-CM | POA: Insufficient documentation

## 2020-08-19 DIAGNOSIS — Z7982 Long term (current) use of aspirin: Secondary | ICD-10-CM | POA: Insufficient documentation

## 2020-08-19 DIAGNOSIS — C3411 Malignant neoplasm of upper lobe, right bronchus or lung: Secondary | ICD-10-CM | POA: Insufficient documentation

## 2020-08-19 NOTE — Progress Notes (Signed)
Ashley [name] presents today for follow-up after completing [place] treatment to her brain on 06/27/2020  Dose of Decadron, if applicable: Finished taper but has resumed daily dose of 2mg  PO daily  Recent neurologic symptoms, if any:   Seizures: None  Headaches: Reports occasional discomfort, but states overall they have improved. States when she does feel one starting, her rizatriptan helps alleviate quickly  Nausea: Yes--managing with ondansetron. Appetite is still decreased but she states she feels hungry more often  Dizziness/ataxia: Yes--reports she feels wobbly and unsteady when she stands. Daughter states her legs often "stop working" mid-stride, and patient has to readjust to get moving again  Difficulty with hand coordination: Slight tremor to right hand  Focal numbness/weakness: Yes--reports her hands often feel like "little bee stings" and her over all strength/stamina has greatly decreased  Visual deficits/changes: Reports ongoing blurry vision  Confusion/Memory deficits: Reports that both long term and short term memory have declined  Additional Complaints / other details: Continues to struggle with fatigue. She states she often has difficult sleeping because she suffers from leg and foot cramps during the night.   Wt Readings from Last 3 Encounters:  08/19/20 103 lb 2 oz (46.8 kg)  08/08/20 104 lb 4.4 oz (47.3 kg)  07/18/20 103 lb 9.6 oz (47 kg)   Vitals:   08/19/20 1105  BP: 102/68  Pulse: 79  Resp: 18  Temp: (!) 96.8 F (36 C)  SpO2: 100%

## 2020-08-22 ENCOUNTER — Encounter: Payer: Self-pay | Admitting: Radiation Oncology

## 2020-08-22 NOTE — Progress Notes (Signed)
Radiation Oncology         [phone] ________________________________  Name: Ashley [name] MRN: [id number]  Date: 08/19/2020  DOB: Apr 04, 1951  Follow-Up Visit Note  Outpatient  CC: Ashley Rival, NP  Ashley Jack, MD  Diagnosis and Prior Radiotherapy:    ICD-10-CM   1. Metastasis to brain [facility])  C79.31   2. Brain metastasis ([place])  C79.31     CHIEF COMPLAINT: Here for follow-up and surveillance of brain cancer  Narrative:  The patient returns today for routine follow-up.   Ashley [name] presents today for follow-up after completing [place] treatment to her brain on 06/27/2020  Dose of Decadron, if applicable: Finished taper but has resumed daily dose of 2mg  PO daily  Recent neurologic symptoms, if any:   Seizures: None  Headaches: Reports occasional discomfort, but states overall they have improved. States when she does feel one starting, her rizatriptan helps alleviate quickly  Nausea: Yes--managing with ondansetron. Appetite is still decreased but she states she feels hungry more often  Dizziness/ataxia: Yes--reports she feels wobbly and unsteady when she stands. Daughter states her legs often "stop working" mid-stride, and patient has to readjust to get moving again  Difficulty with hand coordination: Slight tremor to right hand  Focal numbness/weakness: Yes--reports her hands often feel like "little bee stings" and her over all strength/stamina has greatly decreased  Visual deficits/changes: Reports ongoing blurry vision  Confusion/Memory deficits: Reports that both long term and short term memory have declined  Additional Complaints / other details: Continues to struggle with fatigue. She states she often has difficult sleeping because she suffers from leg and foot cramps during the night.   Wt Readings from Last 3 Encounters:  08/19/20 103 lb 2 oz (46.8 kg)  08/08/20 104 lb 4.4 oz (47.3 kg)  07/18/20 103 lb 9.6 oz (47 kg)   Vitals:   08/19/20 1105  BP:  102/68  Pulse: 79  Resp: 18  Temp: (!) 96.8 F (36 C)  SpO2: 100%                                  ALLERGIES:  is allergic to penicillins.  Meds: Current Outpatient Medications  Medication Sig Dispense Refill  . albuterol (PROVENTIL HFA;VENTOLIN HFA) 108 (90 Base) MCG/ACT inhaler Inhale 2 puffs into the lungs every 6 (six) hours as needed for wheezing or shortness of breath. 1 Inhaler 2  . albuterol (PROVENTIL) (2.5 MG/3ML) 0.083% nebulizer solution Take 2.5 mg by nebulization every 6 (six) hours.    . ALPRAZolam (XANAX) 0.5 MG tablet Take one tablet prior to MRIs and/or radiation oncology procedures. 6 tablet 0  . aspirin EC 81 MG tablet Take 81 mg by mouth daily.    . Calcium Carb-Cholecalciferol 600-800 MG-UNIT TABS Take 1 tablet by mouth 2 (two) times daily.    . Cholecalciferol (VITAMIN D-3) 1000 units CAPS Take 1,000 Units daily by mouth.     . clonazePAM (KLONOPIN) 0.5 MG tablet Take 1 tablet (0.5 mg total) by mouth at bedtime. Take 1 and half tablets by mouth at bedtime. 45 tablet 4  . cyanocobalamin (,VITAMIN B-12,) 1000 MCG/ML injection INJECT 1 ML INTO THE MUSCLE ONCE MONTHLY AS DIRECTED. (Patient taking differently: Inject 1,000 mcg into the muscle every 30 (thirty) days.) 1 mL 5  . dexamethasone (DECADRON) 2 MG tablet Take 1 tablet (2 mg total) by mouth daily. 60 tablet 3  . dexamethasone (DECADRON)  4 MG tablet Take by mouth.    . diclofenac Sodium (VOLTAREN) 1 % GEL Apply 2 grams to hands    . ergocalciferol (VITAMIN D2) 1.25 MG (50000 UT) capsule Take by mouth.    . furosemide (LASIX) 40 MG tablet 1/2 in Am and 1 tablet in PM    . gabapentin (NEURONTIN) 600 MG tablet Take 1 tablet (600 mg total) by mouth daily. 30 tablet 6  . guaiFENesin (MUCINEX) 600 MG 12 hr tablet Take 1 tablet (600 mg total) by mouth 2 (two) times daily as needed for cough or to loosen phlegm.    . ibandronate (BONIVA) 150 MG tablet Take 150 mg by mouth every 30 (thirty) days.     Marland Kitchen ibuprofen (ADVIL)  800 MG tablet Take 1 tablet (800 mg total) by mouth every 8 (eight) hours as needed for moderate pain. 30 tablet 6  . ipratropium (ATROVENT) 0.02 % nebulizer solution Take by nebulization.    Marland Kitchen ipratropium-albuterol (DUONEB) 0.5-2.5 (3) MG/3ML SOLN Take 3 mLs by nebulization every 6 (six) hours as needed (shortness of breath). 360 mL 0  . lidocaine-prilocaine (EMLA) cream Apply a small amount to port a cath site and cover with plastic wrap 1 hour prior to chemotherapy appointments 30 g 3  . magic mouthwash w/lidocaine SOLN 10 cc swab and swallow 240 mL 1  . meclizine (ANTIVERT) 12.5 MG tablet Take by mouth.    . methocarbamol (ROBAXIN) 500 MG tablet Take 500 mg by mouth every 6 (six) hours as needed for muscle spasms.     . metoCLOPramide (REGLAN) 5 MG tablet 1 tablet before meals    . mirtazapine (REMERON) 15 MG tablet TAKE (1) TABLET BY MOUTH AT BEDTIME. 30 tablet 0  . nitroGLYCERIN (NITRODUR - DOSED IN MG/24 HR) 0.1 mg/hr patch Place 0.1 mg onto the skin daily.    . ondansetron (ZOFRAN ODT) 8 MG disintegrating tablet Take 1 tablet (8 mg total) by mouth every 8 (eight) hours as needed for nausea or vomiting. 60 tablet 2  . oxyCODONE (OXY IR/ROXICODONE) 5 MG immediate release tablet Take 1 tablet (5 mg total) by mouth every 8 (eight) hours. 90 tablet 0  . OXYGEN Inhale 3 L into the lungs daily.    . pantoprazole (PROTONIX) 40 MG tablet TAKE ONE TABLET BY MOUTH TWICE DAILY. 60 tablet 0  . polyethylene glycol (MIRALAX / GLYCOLAX) 17 g packet Take 17 g by mouth daily as needed for mild constipation.    . potassium chloride SA (KLOR-CON) 20 MEQ tablet Take 1 tablet (20 mEq total) by mouth 3 (three) times daily. 90 tablet 2  . prochlorperazine (COMPAZINE) 10 MG tablet 1 tablet as needed    . rizatriptan (MAXALT) 10 MG tablet Take 10 mg by mouth 2 (two) times daily. 1-2 times daily May repeat in 2 hours if needed    . rosuvastatin (CRESTOR) 40 MG tablet Take 40 mg by mouth at bedtime.    . sucralfate  (CARAFATE) 1 g tablet Take 1 g by mouth 3 (three) times daily.    . traMADol (ULTRAM) 50 MG tablet Take 50 mg by mouth every 6 (six) hours as needed.     No current facility-administered medications for this encounter.   Facility-Administered Medications Ordered in Other Encounters  Medication Dose Route Frequency Provider Last Rate Last Admin  . sodium chloride flush (NS) 0.9 % injection 10 mL  10 mL Intravenous PRN Lockamy, Randi L, NP-C   10 mL at 12/22/19 1240  Physical Findings: The patient is in no acute distress. Patient is alert and oriented.  height is 5\' 2"  (1.575 m) and weight is 103 lb 2 oz (46.8 kg). Her temporal temperature is 96.8 F (36 C) (abnormal). Her blood pressure is 102/68 and her pulse is 79. Her respiration is 18 and oxygen saturation is 100%. .    Sitting in a wheelchair.  No oral thrush.  Extraocular movements are intact.   Decreased strength in all extremities with muscle wasting but no obvious focalities. No cranial nerve deficits noted.  KPS = 50  100 - Normal; no complaints; no evidence of disease. 90   - Able to carry on normal activity; minor signs or symptoms of disease. 80   - Normal activity with effort; some signs or symptoms of disease. 75   - Cares for self; unable to carry on normal activity or to do active work. 60   - Requires occasional assistance, but is able to care for most of his personal needs. 50   - Requires considerable assistance and frequent medical care. 61   - Disabled; requires special care and assistance. 69   - Severely disabled; hospital admission is indicated although death not imminent. 56   - Very sick; hospital admission necessary; active supportive treatment necessary. 10   - Moribund; fatal processes progressing rapidly. 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 EXAM: NUCLEAR MEDICINE PET SKULL BASE TO THIGH TECHNIQUE: 5.4 mCi F-18 FDG was injected intravenously. Full-ring PET imaging was performed from the skull base to thigh after the radiotracer. CT data was obtained and used for attenuation correction and anatomic localization. Fasting blood glucose: 96 mg/dl COMPARISON:  PET-CT dated 05/26/2020 FINDINGS: Mediastinal blood pool activity: SUV max 2.9 Liver activity: SUV max NA NECK: No hypermetabolic cervical lymphadenopathy. Incidental CT findings: none CHEST: 2.0 cm short axis subcarinal node (series 4/image 70), max SUV 5.2, previously 2.7 cm with max SUV 8.7. 13 mm short axis right perihilar node (series 4/image 32), max SUV 6.8, previously 18 mm with max SUV 8.9. Status post right upper lobectomy. Prior hypermetabolism in the right suprahilar region along the staple line has resolved. Scarring with subpleural reticulation in the right lung. Moderate centrilobular and paraseptal emphysematous changes. No suspicious pulmonary nodules. Left mastectomy with reconstruction and left axillary lymph node dissection. Left chest port terminates in the lower SVC. Incidental CT findings: Atherosclerotic calcifications of the aortic arch. Hypodense blood pool relative to myocardium, suggesting anemia. Mild coronary atherosclerosis of the LAD and left circumflex. Mild anterior pericardial fluid. ABDOMEN/PELVIS: No abnormal hypermetabolism in the liver, spleen, pancreas, or adrenal glands. No hypermetabolic abdominopelvic lymphadenopathy. Incidental CT findings: Stable 2.6 cm right adrenal adenoma. Bilateral  renal cysts. Atherosclerotic calcifications the abdominal aorta and branch vessels. Status post hysterectomy with suspected pelvic floor laxity. SKELETON: No focal hypermetabolic activity to suggest skeletal metastasis. Incidental CT findings: Cervical and lumbar spine fixation hardware.  Degenerative changes of the visualized thoracolumbar spine. IMPRESSION: Status post right upper lobectomy. Mediastinal and right perihilar nodal metastases, improved. Status post left mastectomy with left axillary lymph node dissection. Additional stable ancillary findings as above. Electronically Signed   By: Julian Hy M.D.   On: 08/16/2020 11:09    Impression/Plan:  Overall she is doing relatively well but she does have persistent neurologic issues, largely related to balance and ambulation difficulty.  Her headaches have improved.  She does note a long and short-term decline in memory.  Manuela Schwartz, our navigator, will arrange a brain MRI in early to mid June and follow-up with neuro oncologist Dr. Mickeal Skinner soon thereafter.    I wrote out a continued steroid taper.  She has been taking 2 mg daily of dexamethasone.  Starting on May 14 she will taper down to 2 mg every other day and then on June 4 she will stop  Our team will look into whether home health can provide PT and OT for her.  She has continued issues with balance.  On date of service, in total, I spent 25 minutes on this encounter. Patient was seen in person.  _____________________________________   Eppie Gibson, MD

## 2020-08-23 ENCOUNTER — Other Ambulatory Visit: Payer: Self-pay | Admitting: Radiation Therapy

## 2020-08-23 ENCOUNTER — Other Ambulatory Visit: Payer: Self-pay

## 2020-08-23 DIAGNOSIS — C7931 Secondary malignant neoplasm of brain: Secondary | ICD-10-CM

## 2020-08-23 DIAGNOSIS — C7949 Secondary malignant neoplasm of other parts of nervous system: Secondary | ICD-10-CM

## 2020-08-23 NOTE — Progress Notes (Signed)
Orders placed for port access the day of brain MRI at GI. (6/10)   Mont Dutton R.T.(R)(T) Radiation Special Procedures Navigator

## 2020-08-27 NOTE — Progress Notes (Signed)
Ashley [name], [place] [address]   CLINIC:  Medical Oncology/Hematology  PCP:  [name], NP [address] / [place] [address] [phone]   REASON FOR VISIT:  Follow-up for right small cell lung cancer  PRIOR THERAPY:  1. Right upper lobectomy on 12/30/2018. 2.Radiation to whole brain 25Gy in 10 fractions from 01/28/2019 through 02/10/2019. 3. Carboplatin and etoposide x 4 cycles from 03/09/2019 through 05/11/2019. 4. Lurbinectedin x 7 cycles from 12/15/2019 to 05/05/2020. 5. SBRT to brain metastases in 3 fractions from 06/22/2020 to 06/27/2020.  NGS Results: not done  CURRENT THERAPY: Topotecan D1-5 every 3 weeks  BRIEF ONCOLOGIC HISTORY:  Oncology History  Adenocarcinoma of left breast ([facility])  09/29/1993 - 05/14/1994 Chemotherapy    AC Q 3 weeks X 6 cycles   01/02/1994 Surgery   Left modified radical mastectomy   01/02/1994 Pathology Results   ER-, PR - with a single positive LN found in the L axillae, Stage II disease   07/22/2015 Imaging   Bone Scan, No metastatic pattern uptake, degenerative changes in the lumbar spine with dextroscoliosis   05/25/2016 PET scan   No findings of active malignancy in the neck, chest, abdomen, or pelvis. The 3 liver lesions are not hypermetabolic. The radiologist suspects they may be subtly present on prior CT chest from 10/2013, to further reassuring that these are likely benign lesions.   Melanoma ([place])  07/09/2013 Initial Biopsy   Initial biopsy L upper thigh/buttocks melanoma   07/20/2013 Surgery   Excision L lateral buttock/upper thigh melanoma, clear margins   07/20/2013 Pathology Results   Breslow depth 1.02 mm, pT2a    Small cell lung carcinoma, right ([facility])  11/27/2018 Initial Diagnosis   Small cell lung carcinoma, right ([facility])   03/09/2019 - 05/15/2019 Chemotherapy   The patient had palonosetron (ALOXI) injection 0.25 mg, 0.25 mg, Intravenous,  Once, 4 of 4 cycles Administration:  0.25 mg (03/09/2019), 0.25 mg (03/30/2019), 0.25 mg (04/20/2019), 0.25 mg (05/11/2019) pegfilgrastim (NEULASTA ONPRO KIT) injection 6 mg, 6 mg, Subcutaneous, Once, 1 of 1 cycle Administration: 6 mg (03/11/2019) pegfilgrastim-jmdb (FULPHILA) injection 6 mg, 6 mg, Subcutaneous,  Once, 3 of 3 cycles Administration: 6 mg (04/03/2019), 6 mg (04/24/2019), 6 mg (05/15/2019) CARBOplatin (PARAPLATIN) 330 mg in sodium chloride 0.9 % 250 mL chemo infusion, 330 mg (100 % of original dose 325.5 mg), Intravenous,  Once, 4 of 4 cycles Dose modification:   (original dose 325.5 mg, Cycle 1),   (original dose 325.5 mg, Cycle 2),   (original dose 309 mg, Cycle 3),   (original dose 325.5 mg, Cycle 4) Administration: 330 mg (03/09/2019), 330 mg (03/30/2019), 310 mg (04/20/2019), 330 mg (05/11/2019) etoposide (VEPESID) 150 mg in sodium chloride 0.9 % 500 mL chemo infusion, 100 mg/m2 = 150 mg, Intravenous,  Once, 4 of 4 cycles Administration: 150 mg (03/09/2019), 150 mg (03/10/2019), 150 mg (03/11/2019), 150 mg (03/30/2019), 150 mg (03/31/2019), 150 mg (04/01/2019), 150 mg (04/20/2019), 150 mg (04/21/2019), 150 mg (04/22/2019), 150 mg (05/11/2019), 150 mg (05/12/2019), 150 mg (05/13/2019) fosaprepitant (EMEND) 150 mg, dexamethasone (DECADRON) 12 mg in sodium chloride 0.9 % 145 mL IVPB, , Intravenous,  Once, 4 of 4 cycles Administration:  (03/09/2019),  (03/30/2019),  (04/20/2019),  (05/11/2019)  for chemotherapy treatment.    03/09/2019 Cancer Staging   Staging form: Lung, AJCC 8th Edition - Clinical: Stage IIIA (cT3, cN1, cM0) - Signed by Derek Jack, MD on 03/09/2019   12/15/2019 - 05/05/2020 Chemotherapy  06/06/2020 -  Chemotherapy    Patient is on Treatment Plan: LUNG SMALL CELL TOPOTECAN D1-5 Q21D        CANCER STAGING: Cancer Staging Small cell lung carcinoma, right (HCC) Staging form: Lung, AJCC 8th Edition - Clinical: Stage IIIA (cT3, cN1, cM0) - Signed by Derek Jack, MD on  03/09/2019   INTERVAL HISTORY:  Ashley [name], a 70 y.o. female, returns for routine follow-up and consideration for next cycle of chemotherapy. Darwin was last seen on 08/08/2020.  Due for cycle #5 of Topotecan D1-5 today.   Overall, she tells me she has been feeling pretty well. She denies any n/v/d/c or SOB. She reports mild CP occasionally (1-2 times weekly). She reports increased fatigue during chemo which was increased during the second week. She complains of severe balance issue while walking. The pain is controlled and medication has helped with sleeping w/o disturbance. She reports her appetite is fair, but improved. She denies bleeding in stools, urine, or nose. She is eating 3 meals daily.   Overall, she feels ready for next cycle of chemo today.   REVIEW OF SYSTEMS:  Review of Systems  Constitutional: Positive for appetite change (50%) and fatigue (25%).  HENT:   Negative for nosebleeds.   Respiratory: Positive for cough and shortness of breath.   Cardiovascular: Positive for chest pain.  Gastrointestinal: Positive for constipation and nausea. Negative for blood in stool.  Genitourinary: Negative for hematuria.   Musculoskeletal: Positive for back pain (6/10).  Neurological: Positive for dizziness and numbness.  All other systems reviewed and are negative.   PAST MEDICAL/SURGICAL HISTORY:  Past Medical History:  Diagnosis Date  . Adenocarcinoma of left breast ([facility]) 01/09/2016  . Anginal pain ([facility])   . Arthritis   . Ascending aortic aneurysm ([facility])   . Cancer [facility]) 1995   breast, left, mastectomy/chemo  . Chest pain    Possibly cardiac. No evidence of ischemia/injury based upon normal troponin I. Chest discomfort could be tachycardia induced supply demand mismatch.   . CHF (congestive heart failure) ([facility]) 11/17/2015   after surgery   . Colon adenomas   . Coronary artery disease   . DJD (degenerative joint disease)   . Dyspnea    with exertion  . Emphysema of lung  ([facility]) 11/17/2015  . Essential hypertension, benign   . GERD (gastroesophageal reflux disease)   . History of hiatal hernia   . Hyperlipidemia   . Hypertension   . Melanoma ([facility]) 01/09/2016  . Osteopenia   . Palpitations   . Pernicious anemia 03/06/2016  . Pernicious anemia   . Pure hypercholesterolemia   . Raynaud's disease   . Thrombocythemia, essential ([place]) 01/09/2016  . Thrombocytosis    Idiopathic  . Vitamin D deficiency    Past Surgical History:  Procedure Laterality Date  . ABDOMINAL HYSTERECTOMY    . ANTERIOR AND POSTERIOR REPAIR N/A 12/09/2014   Procedure: ANTERIOR (CYSTOCELE) AND POSTERIOR REPAIR (RECTOCELE);  Surgeon: Bjorn Loser, MD;  Location: [facility];  Service: Urology;  Laterality: N/A;  . ANTERIOR CERVICAL DECOMPRESSION/DISCECTOMY FUSION 4 LEVELS Right 10/03/2016   Procedure: ANTERIOR CERVICAL DECOMPRESSION FUSION, CERVICAL 4-5, CERVICAL 5-6, CERVICAL 6-7, CERVICAL 7 TO THORACIC 1 WITH INSTRUMENTATION AND ALLOGRAFT;  Surgeon: Phylliss Bob, MD;  Location: [facility];  Service: Orthopedics;  Laterality: Right;  ANTERIOR CERVICAL DECOMPRESSION FUSION, CERVICAL 4-5, CERVICAL 5-6, CERVICAL 6-7, CERVICAL 7 TO THORACIC 1 WITH INSTRUMENTATION AND ALLOGRAFT; REQUEST 4 HO  . ANTERIOR LAT LUMBAR FUSION Left 11/16/2015  Procedure: LEFT SIDED LATERAL INTERBODY FUSION, LUMBAR 2-3, LUMBAR 3-4, LUMBAR 4-5 WITH INSTRUMENTATION;  Surgeon: Phylliss Bob, MD;  Location: [facility];  Service: Orthopedics;  Laterality: Left;  LEFT SIDED LATEARL INTERBODY FUSION, LUMBAR 2-3, LUMBAR 3-4, LUMBAR 4-5 WITH INSTRUMENTATION   . APPENDECTOMY    . BACK SURGERY    . BONE MARROW ASPIRATION  07/2012  . BONE MARROW BIOPSY  07/2012  . BREAST SURGERY    . CARDIAC CATHETERIZATION    . CARDIAC CATHETERIZATION N/A 01/20/2016   Procedure: Left Heart Cath and Coronary Angiography;  Surgeon: Burnell Blanks, MD;  Location: [facility];  Service: Cardiovascular;  Laterality: N/A;  . COLONOSCOPY  11/29/2010    Procedure: COLONOSCOPY;  Surgeon: Rogene Houston, MD;  [facility];  Service: Endoscopy;  Laterality: N/A;  . COLONOSCOPY N/A 02/18/2014   Procedure: COLONOSCOPY;  Surgeon: Rogene Houston, MD;  [facility];  Service: Endoscopy;  Laterality: N/A;  1030  . COLONOSCOPY N/A 02/25/2017   Procedure: COLONOSCOPY;  Surgeon: Rogene Houston, MD;  [facility];  Service: Endoscopy;  Laterality: N/A;  10:55  . CYSTOSCOPY N/A 12/09/2014   Procedure: CYSTOSCOPY;  Surgeon: Bjorn Loser, MD;  Location: [facility];  Service: Urology;  Laterality: N/A;  . ESOPHAGEAL DILATION N/A 02/25/2017   Procedure: ESOPHAGEAL DILATION;  Surgeon: Rogene Houston, MD;  [facility];  Service: Endoscopy;  Laterality: N/A;  . ESOPHAGOGASTRODUODENOSCOPY N/A 02/25/2017   Procedure: ESOPHAGOGASTRODUODENOSCOPY (EGD);  Surgeon: Rogene Houston, MD;  [facility];  Service: Endoscopy;  Laterality: N/A;  . IR GENERIC HISTORICAL  01/11/2016   IR RADIOLOGY PERIPHERAL GUIDED IV START 01/11/2016 Saverio Danker, PA-C [facility]  . IR GENERIC HISTORICAL  01/11/2016   IR US GUIDE VASC ACCESS RIGHT 01/11/2016 Saverio Danker, PA-C [facility]  . LYMPH NODE DISSECTION Right 12/30/2018   Procedure: Lymph Node Dissection;  Surgeon: Lajuana Matte, MD;  Location: [facility];  Service: Thoracic;  Laterality: Right;  . MASTECTOMY     left  . OVARIAN CYST SURGERY     x2  . POLYPECTOMY  02/25/2017   Procedure: POLYPECTOMY;  Surgeon: Rogene Houston, MD;  [facility];  Service: Endoscopy;;  . PORTACATH PLACEMENT Left 02/16/2019   Procedure: INSERTION PORT-A-CATH (CATHETER  LEFT SUBCLAVIAN);  Surgeon: Aviva Signs, MD;  Location: [facility];  Service: General;  Laterality: Left;  . SALPINGOOPHORECTOMY Bilateral 12/09/2014   Procedure: SALPINGO OOPHORECTOMY;  Surgeon: Servando Salina, MD;  Location: [facility];  Service: Gynecology;  Laterality: Bilateral;  . TUBAL LIGATION    .  VAGINAL HYSTERECTOMY N/A 12/09/2014   Procedure: HYSTERECTOMY VAGINAL;  Surgeon: Servando Salina, MD;  Location: [facility];  Service: Gynecology;  Laterality: N/A;  . VIDEO ASSISTED THORACOSCOPY (VATS)/ LOBECTOMY Right 12/30/2018   Procedure: VIDEO ASSISTED THORACOSCOPY (VATS)/RIGHT LOWER LOBE WEDGE RESECTION, RIGHT UPPER LOBECTOMY;  Surgeon: Lajuana Matte, MD;  Location: [facility];  Service: Thoracic;  Laterality: Right;  Marland Kitchen VIDEO BRONCHOSCOPY N/A 12/30/2018   Procedure: VIDEO BRONCHOSCOPY;  Surgeon: Lajuana Matte, MD;  Location[facility];  Service: Thoracic;  Laterality: N/A;    SOCIAL HISTORY:  Social History   Socioeconomic History  . Marital status: Divorced    Spouse name: Not on file  . Number of children: 2  . Years of education: Not on file  . Highest education level: Not on file  Occupational History  . Not on file  Tobacco Use  .  Smoking status: Former Smoker    Packs/day: 0.50    Years: 50.00    Pack years: 25.00    Types: Cigarettes    Quit date: 11/25/2018    Years since quitting: 1.7  . Smokeless tobacco: Never Used  Vaping Use  . Vaping Use: Never used  Substance and Sexual Activity  . Alcohol use: Yes    Comment: occasional  . Drug use: No  . Sexual activity: Not Currently    Birth control/protection: Post-menopausal  Other Topics Concern  . Not on file  Social History Narrative  . Not on file   Social Determinants of Health   Financial Resource Strain: Low Risk   . Difficulty of Paying Living Expenses: Not hard at all  Food Insecurity: No Food Insecurity  . Worried About Charity fundraiser in the Last Year: Never true  . Ran Out of Food in the Last Year: Never true  Transportation Needs: No Transportation Needs  . Lack of Transportation (Medical): No  . Lack of Transportation (Non-Medical): No  Physical Activity: Insufficiently Active  . Days of Exercise per Week: 4 days  . Minutes of Exercise per Session: 30 min  Stress: No Stress Concern  Present  . Feeling of Stress : Not at all  Social Connections: Moderately Isolated  . Frequency of Communication with Friends and Family: More than three times a week  . Frequency of Social Gatherings with Friends and Family: More than three times a week  . Attends Religious Services: More than 4 times per year  . Active Member of Clubs or Organizations: No  . Attends Archivist Meetings: Never  . Marital Status: Divorced  Human resources officer Violence: Not At Risk  . Fear of Current or Ex-Partner: No  . Emotionally Abused: No  . Physically Abused: No  . Sexually Abused: No    FAMILY HISTORY:  Family History  Problem Relation Age of Onset  . Hypertension Mother   . Heart failure Mother   . Congestive Heart Failure Mother   . COPD Mother   . Pernicious anemia Mother   . Cancer Mother        lung  . Hypertension Father   . CAD Father   . Heart attack Father   . Hypertension Sister   . Cancer Other   . Celiac disease Other     CURRENT MEDICATIONS:  Current Outpatient Medications  Medication Sig Dispense Refill  . albuterol (PROVENTIL HFA;VENTOLIN HFA) 108 (90 Base) MCG/ACT inhaler Inhale 2 puffs into the lungs every 6 (six) hours as needed for wheezing or shortness of breath. 1 Inhaler 2  . albuterol (PROVENTIL) (2.5 MG/3ML) 0.083% nebulizer solution Take 2.5 mg by nebulization every 6 (six) hours.    . ALPRAZolam (XANAX) 0.5 MG tablet Take one tablet prior to MRIs and/or radiation oncology procedures. 6 tablet 0  . aspirin EC 81 MG tablet Take 81 mg by mouth daily.    . Calcium Carb-Cholecalciferol 600-800 MG-UNIT TABS Take 1 tablet by mouth 2 (two) times daily.    . Cholecalciferol (VITAMIN D-3) 1000 units CAPS Take 1,000 Units daily by mouth.     . clonazePAM (KLONOPIN) 0.5 MG tablet Take 1 tablet (0.5 mg total) by mouth at bedtime. Take 1 and half tablets by mouth at bedtime. 45 tablet 4  . cyanocobalamin (,VITAMIN B-12,) 1000 MCG/ML injection INJECT 1 ML INTO THE  MUSCLE ONCE MONTHLY AS DIRECTED. (Patient taking differently: Inject 1,000 mcg into the muscle every 30 (  thirty) days.) 1 mL 5  . dexamethasone (DECADRON) 2 MG tablet Take 1 tablet (2 mg total) by mouth daily. 60 tablet 3  . dexamethasone (DECADRON) 4 MG tablet Take by mouth.    . diclofenac Sodium (VOLTAREN) 1 % GEL Apply 2 grams to hands    . ergocalciferol (VITAMIN D2) 1.25 MG (50000 UT) capsule Take by mouth.    . furosemide (LASIX) 40 MG tablet 1/2 in Am and 1 tablet in PM    . gabapentin (NEURONTIN) 600 MG tablet Take 1 tablet (600 mg total) by mouth daily. 30 tablet 6  . guaiFENesin (MUCINEX) 600 MG 12 hr tablet Take 1 tablet (600 mg total) by mouth 2 (two) times daily as needed for cough or to loosen phlegm.    . ibandronate (BONIVA) 150 MG tablet Take 150 mg by mouth every 30 (thirty) days.     Marland Kitchen ibuprofen (ADVIL) 800 MG tablet Take 1 tablet (800 mg total) by mouth every 8 (eight) hours as needed for moderate pain. 30 tablet 6  . ipratropium (ATROVENT) 0.02 % nebulizer solution Take by nebulization.    Marland Kitchen ipratropium-albuterol (DUONEB) 0.5-2.5 (3) MG/3ML SOLN Take 3 mLs by nebulization every 6 (six) hours as needed (shortness of breath). 360 mL 0  . lidocaine-prilocaine (EMLA) cream Apply a small amount to port a cath site and cover with plastic wrap 1 hour prior to chemotherapy appointments 30 g 3  . magic mouthwash w/lidocaine SOLN 10 cc swab and swallow 240 mL 1  . meclizine (ANTIVERT) 12.5 MG tablet Take by mouth.    . methocarbamol (ROBAXIN) 500 MG tablet Take 500 mg by mouth every 6 (six) hours as needed for muscle spasms.     . metoCLOPramide (REGLAN) 5 MG tablet 1 tablet before meals    . mirtazapine (REMERON) 15 MG tablet TAKE (1) TABLET BY MOUTH AT BEDTIME. 30 tablet 0  . nitroGLYCERIN (NITRODUR - DOSED IN MG/24 HR) 0.1 mg/hr patch Place 0.1 mg onto the skin daily.    . ondansetron (ZOFRAN ODT) 8 MG disintegrating tablet Take 1 tablet (8 mg total) by mouth every 8 (eight) hours  as needed for nausea or vomiting. 60 tablet 2  . oxyCODONE (OXY IR/ROXICODONE) 5 MG immediate release tablet Take 1 tablet (5 mg total) by mouth every 8 (eight) hours. 90 tablet 0  . OXYGEN Inhale 3 L into the lungs daily.    . pantoprazole (PROTONIX) 40 MG tablet TAKE ONE TABLET BY MOUTH TWICE DAILY. 60 tablet 0  . polyethylene glycol (MIRALAX / GLYCOLAX) 17 g packet Take 17 g by mouth daily as needed for mild constipation.    . potassium chloride SA (KLOR-CON) 20 MEQ tablet Take 1 tablet (20 mEq total) by mouth 3 (three) times daily. 90 tablet 2  . prochlorperazine (COMPAZINE) 10 MG tablet 1 tablet as needed    . rizatriptan (MAXALT) 10 MG tablet Take 10 mg by mouth 2 (two) times daily. 1-2 times daily May repeat in 2 hours if needed    . rosuvastatin (CRESTOR) 40 MG tablet Take 40 mg by mouth at bedtime.    . sucralfate (CARAFATE) 1 g tablet Take 1 g by mouth 3 (three) times daily.    . traMADol (ULTRAM) 50 MG tablet Take 50 mg by mouth every 6 (six) hours as needed.     No current facility-administered medications for this visit.   Facility-Administered Medications Ordered in Other Visits  Medication Dose Route Frequency Provider Last Rate Last Admin  .  sodium chloride flush (NS) 0.9 % injection 10 mL  10 mL Intravenous PRN Lockamy, Randi L, NP-C   10 mL at 12/22/19 1240    ALLERGIES:  Allergies  Allergen Reactions  . Penicillins Hives and Rash    Did it involve swelling of the face/tongue/throat, SOB, or low BP? No Did it involve sudden or severe rash/hives, skin peeling, or any reaction on the inside of your mouth or nose? No Did you need to seek medical attention at a hospital or doctor's office? No When did it last happen?5-10 year If all above answers are "NO", may proceed with cephalosporin use.      PHYSICAL EXAM:  Performance status (ECOG): 1 - Symptomatic but completely ambulatory  There were no vitals filed for this visit. Wt Readings from Last 3 Encounters:   08/19/20 103 lb 2 oz (46.8 kg)  08/08/20 104 lb 4.4 oz (47.3 kg)  07/18/20 103 lb 9.6 oz (47 kg)   Physical Exam Vitals reviewed.  Constitutional:      Appearance: Normal appearance.  Cardiovascular:     Rate and Rhythm: Normal rate and regular rhythm.     Pulses: Normal pulses.     Heart sounds: Normal heart sounds.  Pulmonary:     Effort: Pulmonary effort is normal.     Breath sounds: Normal breath sounds.  Abdominal:     Palpations: Abdomen is soft. There is no hepatomegaly, splenomegaly or mass.     Tenderness: There is no abdominal tenderness.  Musculoskeletal:     Right lower leg: No edema.     Left lower leg: No edema.  Neurological:     General: No focal deficit present.     Mental Status: She is alert and oriented to person, place, and time.  Psychiatric:        Mood and Affect: Mood normal.        Behavior: Behavior normal.     LABORATORY DATA:  I have reviewed the labs as listed.  CBC Latest Ref Rng & Units 08/08/2020 07/18/2020 06/27/2020  WBC 4.0 - 10.5 K/uL 13.4(H) 10.0 6.9  Hemoglobin 12.0 - 15.0 g/dL 9.6(L) 10.1(L) 11.1(L)  Hematocrit 36.0 - 46.0 % 30.6(L) 32.4(L) 35.2(L)  Platelets 150 - 400 K/uL 477(H) 474(H) 697(H)   CMP Latest Ref Rng & Units 08/08/2020 07/18/2020 06/27/2020  Glucose 70 - 99 mg/dL 174(H) 151(H) 95  BUN 8 - 23 mg/dL '16 22 20  ' Creatinine 0.44 - 1.00 mg/dL 1.25(H) 1.30(H) 0.92  Sodium 135 - 145 mmol/L 137 137 139  Potassium 3.5 - 5.1 mmol/L 3.7 2.8(L) 4.0  Chloride 98 - 111 mmol/L 100 97(L) 105  CO2 22 - 32 mmol/L '26 30 25  ' Calcium 8.9 - 10.3 mg/dL 8.7(L) 8.1(L) 8.7(L)  Total Protein 6.5 - 8.1 g/dL 6.4(L) 6.3(L) 6.8  Total Bilirubin 0.3 - 1.2 mg/dL 0.5 0.5 0.3  Alkaline Phos 38 - 126 U/L 45 46 59  AST 15 - 41 U/L '27 24 22  ' ALT 0 - 44 U/L '22 19 19    ' DIAGNOSTIC IMAGING:  I have independently reviewed the scans and discussed with the patient. NM PET Image Restag (PS) Skull Base To Thigh  Result Date: 08/16/2020 CLINICAL DATA:   Subsequent treatment strategy for small cell lung cancer. EXAM: NUCLEAR MEDICINE PET SKULL BASE TO THIGH TECHNIQUE: 5.4 mCi F-18 FDG was injected intravenously. Full-ring PET imaging was performed from the skull base to thigh after the radiotracer. CT data was obtained and used for attenuation correction  and anatomic localization. Fasting blood glucose: 96 mg/dl COMPARISON:  PET-CT dated 05/26/2020 FINDINGS: Mediastinal blood pool activity: SUV max 2.9 Liver activity: SUV max NA NECK: No hypermetabolic cervical lymphadenopathy. Incidental CT findings: none CHEST: 2.0 cm short axis subcarinal node (series 4/image 70), max SUV 5.2, previously 2.7 cm with max SUV 8.7. 13 mm short axis right perihilar node (series 4/image 32), max SUV 6.8, previously 18 mm with max SUV 8.9. Status post right upper lobectomy. Prior hypermetabolism in the right suprahilar region along the staple line has resolved. Scarring with subpleural reticulation in the right lung. Moderate centrilobular and paraseptal emphysematous changes. No suspicious pulmonary nodules. Left mastectomy with reconstruction and left axillary lymph node dissection. Left chest port terminates in the lower SVC. Incidental CT findings: Atherosclerotic calcifications of the aortic arch. Hypodense blood pool relative to myocardium, suggesting anemia. Mild coronary atherosclerosis of the LAD and left circumflex. Mild anterior pericardial fluid. ABDOMEN/PELVIS: No abnormal hypermetabolism in the liver, spleen, pancreas, or adrenal glands. No hypermetabolic abdominopelvic lymphadenopathy. Incidental CT findings: Stable 2.6 cm right adrenal adenoma. Bilateral renal cysts. Atherosclerotic calcifications the abdominal aorta and branch vessels. Status post hysterectomy with suspected pelvic floor laxity. SKELETON: No focal hypermetabolic activity to suggest skeletal metastasis. Incidental CT findings: Cervical and lumbar spine fixation hardware. Degenerative changes of the  visualized thoracolumbar spine. IMPRESSION: Status post right upper lobectomy. Mediastinal and right perihilar nodal metastases, improved. Status post left mastectomy with left axillary lymph node dissection. Additional stable ancillary findings as above. Electronically Signed   By: Julian Hy M.D.   On: 08/16/2020 11:09     ASSESSMENT:  1.Extensive stage small cell lung cancer: -Right upper lobectomy on 12/30/2018, pathology-4.2 cm small cell lung cancer, invading visceral pleura, 1/3 lymph nodes positive, LVI positive, metastatic carcinoma in 211 or lymph nodes, right chest wall biopsy consistent with small cell carcinoma. -30 Gy of radiation in 10 fractions from 01/28/2019 through 02/10/2019. -4 cycles of carboplatin and etoposide from 03/09/2019 through 05/11/2019. -PCI completed on 07/31/2019. -We reviewed MRI of the brain on 09/15/2019 with no evidence of brain meta stasis. Concern for hypointense appearance at the C3 vertebral body and left articular process. -CT chest on 09/15/2019 shows numerous new small solid irregular pulmonary nodules scattered throughout both lungs, largest 7 mm on the right lower lobe. Tiny loculated anterior right pleural effusion decreased. Stable mild subcarinal adenopathy. Bilateral stable adrenal adenomas. -MRI of the C-spine on 10/13/2019 showed marrow edema and enhancement involving C3 vertebral body and left articular process without discrete bone lesion. This was thought to be degenerative. -PET scan on 09/28/2019 shows bilateral lung nodules, below PET resolution. Small right-sided nodular densities demonstrating low-level hypermetabolism. Mild hypermetabolic corresponding to a normal-sized subcarinal lymph node. Multifocal right pleural hypermetabolism in the setting of pleural thickening and prior right upper lobectomy, indeterminate. No hypermetabolic extrathoracic disease. -PET scan on 11/24/2019 shows progressive increased soft tissue within the  right lung along the suture margins, positive right paratracheal lymph node, small subcapsular focus of increased radiotracer uptake overlying the anterolateral right hepatic lobe concerning for tumor. -PET scan on 05/26/2020 with progression with bulky hypermetabolic subcarinal and right hilar lymphadenopathy. Hypermetabolic lymph nodes in the right paratracheal and precarinal stations.   PLAN:  1.Extensive stage small cell lung cancer: -Reviewed PET scan results from 08/15/2020 which showed subcarinal lymph node improved to 2 cm, from 2.7 cm previously.  13 mm right perihilar node, previously 18 mm. - Reviewed her labs today which showed normal LFTs.  Creatinine  is 1.19.  CBC shows normal white count and platelet count but with anemia of hemoglobin 8.9. - She will proceed with her treatment today at 1.2 mg per metered square topotecan. - RTC 3 weeks for follow-up.  2. Essential thrombocytosis: -Hydroxyurea on hold since chemotherapy started.  Platelet count today is 542.  We will closely monitor.  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  This is from myelosuppression.  We will closely monitor.  4. Weight loss: -Continue dexamethasone 2 mg in the mornings.  Continue Remeron daily.  5. Mid back and sternal pain: -Continue oxycodone alternating with tramadol.  Rate well controlled.  6. Hypokalemia: -Continue oral potassium supplements.  Potassium today 3.6.  7. Brain metastasis: -She has completed 3 SRS treatments. - Continue dexamethasone 2 mg daily.  8.  Sleep problem: - Continue Klonopin 1 and half tablets at bedtime as needed.   Orders placed this encounter:  No orders of the defined types were placed in this encounter.    Derek Jack, MD [place] [phone]   I, Thana Ates, am acting as a scribe for Dr. Derek Jack.  I, Derek Jack MD, have reviewed the above documentation for accuracy  and completeness, and I agree with the above.

## 2020-08-29 ENCOUNTER — Inpatient Hospital Stay (HOSPITAL_COMMUNITY): Payer: Medicare Other | Attending: Hematology

## 2020-08-29 ENCOUNTER — Other Ambulatory Visit: Payer: Self-pay

## 2020-08-29 ENCOUNTER — Ambulatory Visit (HOSPITAL_COMMUNITY): Payer: Medicare Other | Admitting: Hematology

## 2020-08-29 ENCOUNTER — Inpatient Hospital Stay (HOSPITAL_COMMUNITY): Payer: Medicare Other

## 2020-08-29 ENCOUNTER — Ambulatory Visit (HOSPITAL_COMMUNITY): Payer: Medicare Other

## 2020-08-29 ENCOUNTER — Other Ambulatory Visit (HOSPITAL_COMMUNITY): Payer: Medicare Other

## 2020-08-29 ENCOUNTER — Inpatient Hospital Stay (HOSPITAL_BASED_OUTPATIENT_CLINIC_OR_DEPARTMENT_OTHER): Payer: Medicare Other | Admitting: Hematology

## 2020-08-29 VITALS — BP 118/95 | HR 87 | Temp 96.8°F | Resp 17 | Wt 99.4 lb

## 2020-08-29 DIAGNOSIS — Z171 Estrogen receptor negative status [ER-]: Secondary | ICD-10-CM | POA: Diagnosis not present

## 2020-08-29 DIAGNOSIS — C7931 Secondary malignant neoplasm of brain: Secondary | ICD-10-CM | POA: Insufficient documentation

## 2020-08-29 DIAGNOSIS — Z923 Personal history of irradiation: Secondary | ICD-10-CM | POA: Insufficient documentation

## 2020-08-29 DIAGNOSIS — C3411 Malignant neoplasm of upper lobe, right bronchus or lung: Secondary | ICD-10-CM | POA: Insufficient documentation

## 2020-08-29 DIAGNOSIS — I7300 Raynaud's syndrome without gangrene: Secondary | ICD-10-CM | POA: Insufficient documentation

## 2020-08-29 DIAGNOSIS — J439 Emphysema, unspecified: Secondary | ICD-10-CM | POA: Diagnosis not present

## 2020-08-29 DIAGNOSIS — C7989 Secondary malignant neoplasm of other specified sites: Secondary | ICD-10-CM | POA: Diagnosis present

## 2020-08-29 DIAGNOSIS — Z79899 Other long term (current) drug therapy: Secondary | ICD-10-CM | POA: Insufficient documentation

## 2020-08-29 DIAGNOSIS — I209 Angina pectoris, unspecified: Secondary | ICD-10-CM | POA: Diagnosis not present

## 2020-08-29 DIAGNOSIS — E7800 Pure hypercholesterolemia, unspecified: Secondary | ICD-10-CM | POA: Insufficient documentation

## 2020-08-29 DIAGNOSIS — Z7982 Long term (current) use of aspirin: Secondary | ICD-10-CM | POA: Insufficient documentation

## 2020-08-29 DIAGNOSIS — I10 Essential (primary) hypertension: Secondary | ICD-10-CM | POA: Insufficient documentation

## 2020-08-29 DIAGNOSIS — I2510 Atherosclerotic heart disease of native coronary artery without angina pectoris: Secondary | ICD-10-CM | POA: Insufficient documentation

## 2020-08-29 DIAGNOSIS — R2689 Other abnormalities of gait and mobility: Secondary | ICD-10-CM | POA: Insufficient documentation

## 2020-08-29 DIAGNOSIS — R5383 Other fatigue: Secondary | ICD-10-CM | POA: Diagnosis not present

## 2020-08-29 DIAGNOSIS — I712 Thoracic aortic aneurysm, without rupture: Secondary | ICD-10-CM | POA: Diagnosis not present

## 2020-08-29 DIAGNOSIS — D649 Anemia, unspecified: Secondary | ICD-10-CM | POA: Diagnosis not present

## 2020-08-29 DIAGNOSIS — M1990 Unspecified osteoarthritis, unspecified site: Secondary | ICD-10-CM | POA: Diagnosis not present

## 2020-08-29 DIAGNOSIS — Z79891 Long term (current) use of opiate analgesic: Secondary | ICD-10-CM | POA: Insufficient documentation

## 2020-08-29 DIAGNOSIS — D473 Essential (hemorrhagic) thrombocythemia: Secondary | ICD-10-CM | POA: Insufficient documentation

## 2020-08-29 DIAGNOSIS — C3491 Malignant neoplasm of unspecified part of right bronchus or lung: Secondary | ICD-10-CM

## 2020-08-29 DIAGNOSIS — R634 Abnormal weight loss: Secondary | ICD-10-CM | POA: Insufficient documentation

## 2020-08-29 DIAGNOSIS — D531 Other megaloblastic anemias, not elsewhere classified: Secondary | ICD-10-CM | POA: Diagnosis not present

## 2020-08-29 DIAGNOSIS — Z9012 Acquired absence of left breast and nipple: Secondary | ICD-10-CM | POA: Insufficient documentation

## 2020-08-29 DIAGNOSIS — Z85820 Personal history of malignant melanoma of skin: Secondary | ICD-10-CM | POA: Diagnosis not present

## 2020-08-29 DIAGNOSIS — E559 Vitamin D deficiency, unspecified: Secondary | ICD-10-CM | POA: Insufficient documentation

## 2020-08-29 DIAGNOSIS — Z5111 Encounter for antineoplastic chemotherapy: Secondary | ICD-10-CM | POA: Diagnosis present

## 2020-08-29 DIAGNOSIS — E876 Hypokalemia: Secondary | ICD-10-CM | POA: Insufficient documentation

## 2020-08-29 DIAGNOSIS — I509 Heart failure, unspecified: Secondary | ICD-10-CM | POA: Insufficient documentation

## 2020-08-29 DIAGNOSIS — Z87891 Personal history of nicotine dependence: Secondary | ICD-10-CM | POA: Insufficient documentation

## 2020-08-29 DIAGNOSIS — G893 Neoplasm related pain (acute) (chronic): Secondary | ICD-10-CM | POA: Insufficient documentation

## 2020-08-29 LAB — MAGNESIUM: Magnesium: 2 mg/dL (ref 1.7–2.4)

## 2020-08-29 LAB — CBC WITH DIFFERENTIAL/PLATELET
Band Neutrophils: 6 %
Basophils Absolute: 0 10*3/uL (ref 0.0–0.1)
Basophils Relative: 0 %
Eosinophils Absolute: 0 10*3/uL (ref 0.0–0.5)
Eosinophils Relative: 0 %
HCT: 28.9 % — ABNORMAL LOW (ref 36.0–46.0)
Hemoglobin: 8.9 g/dL — ABNORMAL LOW (ref 12.0–15.0)
Lymphocytes Relative: 20 %
Lymphs Abs: 2.4 10*3/uL (ref 0.7–4.0)
MCH: 31 pg (ref 26.0–34.0)
MCHC: 30.8 g/dL (ref 30.0–36.0)
MCV: 100.7 fL — ABNORMAL HIGH (ref 80.0–100.0)
Metamyelocytes Relative: 3 %
Monocytes Absolute: 1.6 10*3/uL — ABNORMAL HIGH (ref 0.1–1.0)
Monocytes Relative: 13 %
Myelocytes: 1 %
Neutro Abs: 7.6 10*3/uL (ref 1.7–7.7)
Neutrophils Relative %: 57 %
Platelets: 542 10*3/uL — ABNORMAL HIGH (ref 150–400)
RBC: 2.87 MIL/uL — ABNORMAL LOW (ref 3.87–5.11)
RDW: 23.3 % — ABNORMAL HIGH (ref 11.5–15.5)
WBC: 12.1 10*3/uL — ABNORMAL HIGH (ref 4.0–10.5)
nRBC: 1.2 % — ABNORMAL HIGH (ref 0.0–0.2)

## 2020-08-29 LAB — COMPREHENSIVE METABOLIC PANEL
ALT: 12 U/L (ref 0–44)
AST: 18 U/L (ref 15–41)
Albumin: 3.3 g/dL — ABNORMAL LOW (ref 3.5–5.0)
Alkaline Phosphatase: 54 U/L (ref 38–126)
Anion gap: 4 — ABNORMAL LOW (ref 5–15)
BUN: 19 mg/dL (ref 8–23)
CO2: 27 mmol/L (ref 22–32)
Calcium: 8.5 mg/dL — ABNORMAL LOW (ref 8.9–10.3)
Chloride: 110 mmol/L (ref 98–111)
Creatinine, Ser: 1.19 mg/dL — ABNORMAL HIGH (ref 0.44–1.00)
GFR, Estimated: 49 mL/min — ABNORMAL LOW (ref >60)
Glucose, Bld: 106 mg/dL — ABNORMAL HIGH (ref 70–99)
Potassium: 3.6 mmol/L (ref 3.5–5.1)
Sodium: 141 mmol/L (ref 135–145)
Total Bilirubin: 0.3 mg/dL (ref 0.3–1.2)
Total Protein: 6.4 g/dL — ABNORMAL LOW (ref 6.5–8.1)

## 2020-08-29 MED ORDER — ONDANSETRON HCL 40 MG/20ML IJ SOLN
Freq: Once | INTRAMUSCULAR | Status: AC
  Administered 2020-08-29: 8 mg via INTRAVENOUS
  Filled 2020-08-29: qty 8

## 2020-08-29 MED ORDER — HEPARIN SOD (PORK) LOCK FLUSH 100 UNIT/ML IV SOLN
500.0000 [IU] | Freq: Once | INTRAVENOUS | Status: AC | PRN
  Administered 2020-08-29: 500 [IU]

## 2020-08-29 MED ORDER — SODIUM CHLORIDE 0.9% FLUSH
10.0000 mL | INTRAVENOUS | Status: DC | PRN
  Administered 2020-08-29: 10 mL

## 2020-08-29 MED ORDER — TOPOTECAN HCL CHEMO INJECTION 4 MG
1.2000 mg/m2 | Freq: Once | INTRAVENOUS | Status: AC
  Administered 2020-08-29: 1.7 mg via INTRAVENOUS
  Filled 2020-08-29: qty 1.7

## 2020-08-29 MED ORDER — SODIUM CHLORIDE 0.9 % IV SOLN: Freq: Once | INTRAVENOUS | Status: AC

## 2020-08-29 NOTE — Progress Notes (Signed)
Patient assessed and labs reviewed by Dr Delton Coombes.  No distress ntoed.  Okay for treatment today.

## 2020-08-29 NOTE — Patient Instructions (Signed)
[place] at [facility] Discharge Instructions  You were seen today by Dr. Delton Coombes. He went over your recent results. You received treatment today. Dr. Delton Coombes will see you back in 3 weeks for labs and follow up.   Thank you for choosing [place] at [facility] to provide your oncology and hematology care.  To afford each patient quality time with our provider, please arrive at least 15 minutes before your scheduled appointment time.   If you have a lab appointment with the [place] please come in thru the Main Entrance and check in at the main information desk  You need to re-schedule your appointment should you arrive 10 or more minutes late.  We strive to give you quality time with our providers, and arriving late affects you and other patients whose appointments are after yours.  Also, if you no show three or more times for appointments you may be dismissed from the clinic at the providers discretion.     Again, thank you for choosing [facility].  Our hope is that these requests will decrease the amount of time that you wait before being seen by our physicians.       _____________________________________________________________  Should you have questions after your visit to [facility], please contact our office at [phone] between the hours of 8:00 a.m. and 4:30 p.m.  Voicemails left after 4:00 p.m. will not be returned until the following business day.  For prescription refill requests, have your pharmacy contact our office and allow 72 hours.    Cancer Center Support Programs:   > Cancer Support Group  2nd Tuesday of the month 1pm-2pm, Journey Room

## 2020-08-29 NOTE — Progress Notes (Signed)
Pt here for day 1 topotecan.  Okay for treatment today per Dr Raliegh Ip.  Tolerated treatment well today without incidence. AVS reviewed.  Vital signs stable prior to discharge.  Discharged in stable condition via wheelchair with daughter.  Stable during and after infusion.

## 2020-08-29 NOTE — Patient Instructions (Signed)
[place]  Discharge Instructions: Thank you for choosing [place] to provide your oncology and hematology care.  If you have a lab appointment with the [facility], please come in thru the Main Entrance and check in at the main information desk.  Wear comfortable clothing and clothing appropriate for easy access to any Portacath or PICC line.   We strive to give you quality time with your provider. You may need to reschedule your appointment if you arrive late (15 or more minutes).  Arriving late affects you and other patients whose appointments are after yours.  Also, if you miss three or more appointments without notifying the office, you may be dismissed from the clinic at the provider's discretion.      For prescription refill requests, have your pharmacy contact our office and allow 72 hours for refills to be completed.    Today you received the following chemotherapy and/or immunotherapy agents topotecan   To help prevent nausea and vomiting after your treatment, we encourage you to take your nausea medication as directed.  BELOW ARE SYMPTOMS THAT SHOULD BE REPORTED IMMEDIATELY: . *FEVER GREATER THAN 100.4 F (38 C) OR HIGHER . *CHILLS OR SWEATING . *NAUSEA AND VOMITING THAT IS NOT CONTROLLED WITH YOUR NAUSEA MEDICATION . *UNUSUAL SHORTNESS OF BREATH . *UNUSUAL BRUISING OR BLEEDING . *URINARY PROBLEMS (pain or burning when urinating, or frequent urination) . *BOWEL PROBLEMS (unusual diarrhea, constipation, pain near the anus) . TENDERNESS IN MOUTH AND THROAT WITH OR WITHOUT PRESENCE OF ULCERS (sore throat, sores in mouth, or a toothache) . UNUSUAL RASH, SWELLING OR PAIN  . UNUSUAL VAGINAL DISCHARGE OR ITCHING   Items with * indicate a potential emergency and should be followed up as soon as possible or go to the Emergency Department if any problems should occur.  Please show the CHEMOTHERAPY ALERT CARD or IMMUNOTHERAPY ALERT CARD at check-in to the  Emergency Department and triage nurse.  Should you have questions after your visit or need to cancel or reschedule your appointment, please contact [facility] [phone]  and follow the prompts.  Office hours are 8:00 a.m. to 4:30 p.m. Monday - Friday. Please note that voicemails left after 4:00 p.m. may not be returned until the following business day.  We are closed weekends and major holidays. You have access to a nurse at all times for urgent questions. Please call the main number to the clinic [phone] and follow the prompts.  For any non-urgent questions, you may also contact your provider using MyChart. We now offer e-Visits for anyone 57 and older to request care online for non-urgent symptoms. For details visit [email or web address].   Also download the MyChart app! Go to the app store, search "MyChart", open the app, select [place], and log in with your MyChart username and password.  Due to Covid, a mask is required upon entering the hospital/clinic. If you do not have a mask, one will be given to you upon arrival. For doctor visits, patients may have 1 support person aged 57 or older with them. For treatment visits, patients cannot have anyone with them due to current Covid guidelines and our immunocompromised population.

## 2020-08-30 ENCOUNTER — Inpatient Hospital Stay (HOSPITAL_COMMUNITY): Payer: Medicare Other

## 2020-08-30 ENCOUNTER — Encounter (HOSPITAL_COMMUNITY): Payer: Self-pay

## 2020-08-30 VITALS — BP 110/65 | HR 70 | Temp 98.5°F | Resp 17

## 2020-08-30 DIAGNOSIS — Z5111 Encounter for antineoplastic chemotherapy: Secondary | ICD-10-CM | POA: Diagnosis not present

## 2020-08-30 DIAGNOSIS — C3491 Malignant neoplasm of unspecified part of right bronchus or lung: Secondary | ICD-10-CM

## 2020-08-30 MED ORDER — TOPOTECAN HCL CHEMO INJECTION 4 MG
1.2000 mg/m2 | Freq: Once | INTRAVENOUS | Status: AC
  Administered 2020-08-30: 1.7 mg via INTRAVENOUS
  Filled 2020-08-30: qty 1.7

## 2020-08-30 MED ORDER — SODIUM CHLORIDE 0.9% FLUSH
10.0000 mL | INTRAVENOUS | Status: DC | PRN
  Administered 2020-08-30 (×2): 10 mL

## 2020-08-30 MED ORDER — HEPARIN SOD (PORK) LOCK FLUSH 100 UNIT/ML IV SOLN
500.0000 [IU] | Freq: Once | INTRAVENOUS | Status: AC | PRN
  Administered 2020-08-30: 500 [IU]

## 2020-08-30 MED ORDER — SODIUM CHLORIDE 0.9 % IV SOLN
Freq: Once | INTRAVENOUS | Status: AC
  Administered 2020-08-30: 8 mg via INTRAVENOUS
  Filled 2020-08-30: qty 8

## 2020-08-30 MED ORDER — SODIUM CHLORIDE 0.9 % IV SOLN: Freq: Once | INTRAVENOUS | Status: AC

## 2020-08-30 NOTE — Progress Notes (Signed)
Pt here for day 2 of topotecan.  Tolerated treatment well today without incidence.  Stable during and after treatment.  Vital signs stable prior to discharge.  AVS reviewed.  Discharged in stable condition via wheelchair with daughter.

## 2020-08-30 NOTE — Patient Instructions (Signed)
[place]  Discharge Instructions: Thank you for choosing [place] to provide your oncology and hematology care.  If you have a lab appointment with the [facility], please come in thru the Main Entrance and check in at the main information desk.  Wear comfortable clothing and clothing appropriate for easy access to any Portacath or PICC line.   We strive to give you quality time with your provider. You may need to reschedule your appointment if you arrive late (15 or more minutes).  Arriving late affects you and other patients whose appointments are after yours.  Also, if you miss three or more appointments without notifying the office, you may be dismissed from the clinic at the provider's discretion.      For prescription refill requests, have your pharmacy contact our office and allow 72 hours for refills to be completed.    Today you received the following chemotherapy and/or immunotherapy agents topotecan day 2.     To help prevent nausea and vomiting after your treatment, we encourage you to take your nausea medication as directed.  BELOW ARE SYMPTOMS THAT SHOULD BE REPORTED IMMEDIATELY: . *FEVER GREATER THAN 100.4 F (38 C) OR HIGHER . *CHILLS OR SWEATING . *NAUSEA AND VOMITING THAT IS NOT CONTROLLED WITH YOUR NAUSEA MEDICATION . *UNUSUAL SHORTNESS OF BREATH . *UNUSUAL BRUISING OR BLEEDING . *URINARY PROBLEMS (pain or burning when urinating, or frequent urination) . *BOWEL PROBLEMS (unusual diarrhea, constipation, pain near the anus) . TENDERNESS IN MOUTH AND THROAT WITH OR WITHOUT PRESENCE OF ULCERS (sore throat, sores in mouth, or a toothache) . UNUSUAL RASH, SWELLING OR PAIN  . UNUSUAL VAGINAL DISCHARGE OR ITCHING   Items with * indicate a potential emergency and should be followed up as soon as possible or go to the Emergency Department if any problems should occur.  Please show the CHEMOTHERAPY ALERT CARD or IMMUNOTHERAPY ALERT CARD at  check-in to the Emergency Department and triage nurse.  Should you have questions after your visit or need to cancel or reschedule your appointment, please contact [facility] [phone]  and follow the prompts.  Office hours are 8:00 a.m. to 4:30 p.m. Monday - Friday. Please note that voicemails left after 4:00 p.m. may not be returned until the following business day.  We are closed weekends and major holidays. You have access to a nurse at all times for urgent questions. Please call the main number to the clinic [phone] and follow the prompts.  For any non-urgent questions, you may also contact your provider using MyChart. We now offer e-Visits for anyone 82 and older to request care online for non-urgent symptoms. For details visit [email or web address].   Also download the MyChart app! Go to the app store, search "MyChart", open the app, select [place], and log in with your MyChart username and password.  Due to Covid, a mask is required upon entering the hospital/clinic. If you do not have a mask, one will be given to you upon arrival. For doctor visits, patients may have 1 support person aged 72 or older with them. For treatment visits, patients cannot have anyone with them due to current Covid guidelines and our immunocompromised population.

## 2020-08-31 ENCOUNTER — Inpatient Hospital Stay (HOSPITAL_COMMUNITY): Payer: Medicare Other

## 2020-08-31 ENCOUNTER — Encounter (HOSPITAL_COMMUNITY): Payer: Self-pay

## 2020-08-31 ENCOUNTER — Other Ambulatory Visit: Payer: Self-pay

## 2020-08-31 VITALS — BP 103/64 | HR 62 | Temp 96.9°F | Resp 18

## 2020-08-31 DIAGNOSIS — Z5111 Encounter for antineoplastic chemotherapy: Secondary | ICD-10-CM | POA: Diagnosis not present

## 2020-08-31 DIAGNOSIS — C3491 Malignant neoplasm of unspecified part of right bronchus or lung: Secondary | ICD-10-CM

## 2020-08-31 MED ORDER — HEPARIN SOD (PORK) LOCK FLUSH 100 UNIT/ML IV SOLN
500.0000 [IU] | Freq: Once | INTRAVENOUS | Status: AC | PRN
  Administered 2020-08-31: 500 [IU]

## 2020-08-31 MED ORDER — SODIUM CHLORIDE 0.9 % IV SOLN
8.0000 mg | Freq: Once | INTRAVENOUS | Status: AC
Start: 2020-08-31 — End: 2020-08-31
  Administered 2020-08-31: 8 mg via INTRAVENOUS
  Filled 2020-08-31: qty 8

## 2020-08-31 MED ORDER — SODIUM CHLORIDE 0.9% FLUSH
10.0000 mL | INTRAVENOUS | Status: DC | PRN
  Administered 2020-08-31: 10 mL

## 2020-08-31 MED ORDER — SODIUM CHLORIDE 0.9 % IV SOLN: Freq: Once | INTRAVENOUS | Status: DC

## 2020-08-31 MED ORDER — SODIUM CHLORIDE 0.9 % IV SOLN
8.0000 mg | Freq: Once | INTRAVENOUS | Status: DC
  Filled 2020-08-31: qty 4

## 2020-08-31 MED ORDER — SODIUM CHLORIDE 0.9 % IV SOLN: Freq: Once | INTRAVENOUS | Status: AC

## 2020-08-31 MED ORDER — TOPOTECAN HCL CHEMO INJECTION 4 MG
1.2000 mg/m2 | Freq: Once | INTRAVENOUS | Status: AC
  Administered 2020-08-31: 1.7 mg via INTRAVENOUS
  Filled 2020-08-31: qty 1.7

## 2020-08-31 NOTE — Progress Notes (Signed)
Treatment given today per MD orders. Tolerated infusion without adverse affects. Vital signs stable. No complaints at this time. Discharged from clinic via wheel chair in stable condition. Alert and oriented x 3. F/U with [facility] as scheduled.

## 2020-08-31 NOTE — Patient Instructions (Signed)
[place]  Discharge Instructions: Thank you for choosing [place] to provide your oncology and hematology care.  If you have a lab appointment with the [facility], please come in thru the Main Entrance and check in at the main information desk.  Wear comfortable clothing and clothing appropriate for easy access to any Portacath or PICC line.   We strive to give you quality time with your provider. You may need to reschedule your appointment if you arrive late (15 or more minutes).  Arriving late affects you and other patients whose appointments are after yours.  Also, if you miss three or more appointments without notifying the office, you may be dismissed from the clinic at the provider's discretion.      For prescription refill requests, have your pharmacy contact our office and allow 72 hours for refills to be completed.  Today you received the following chemotherapy and/or immunotherapy agents Topotecan.     To help prevent nausea and vomiting after your treatment, we encourage you to take your nausea medication as directed.  BELOW ARE SYMPTOMS THAT SHOULD BE REPORTED IMMEDIATELY: . *FEVER GREATER THAN 100.4 F (38 C) OR HIGHER . *CHILLS OR SWEATING . *NAUSEA AND VOMITING THAT IS NOT CONTROLLED WITH YOUR NAUSEA MEDICATION . *UNUSUAL SHORTNESS OF BREATH . *UNUSUAL BRUISING OR BLEEDING . *URINARY PROBLEMS (pain or burning when urinating, or frequent urination) . *BOWEL PROBLEMS (unusual diarrhea, constipation, pain near the anus) . TENDERNESS IN MOUTH AND THROAT WITH OR WITHOUT PRESENCE OF ULCERS (sore throat, sores in mouth, or a toothache) . UNUSUAL RASH, SWELLING OR PAIN  . UNUSUAL VAGINAL DISCHARGE OR ITCHING   Items with * indicate a potential emergency and should be followed up as soon as possible or go to the Emergency Department if any problems should occur.  Please show the CHEMOTHERAPY ALERT CARD or IMMUNOTHERAPY ALERT CARD at check-in to the  Emergency Department and triage nurse.  Should you have questions after your visit or need to cancel or reschedule your appointment, please contact [facility] [phone]  and follow the prompts.  Office hours are 8:00 a.m. to 4:30 p.m. Monday - Friday. Please note that voicemails left after 4:00 p.m. may not be returned until the following business day.  We are closed weekends and major holidays. You have access to a nurse at all times for urgent questions. Please call the main number to the clinic [phone] and follow the prompts.  For any non-urgent questions, you may also contact your provider using MyChart. We now offer e-Visits for anyone 51 and older to request care online for non-urgent symptoms. For details visit [email or web address].   Also download the MyChart app! Go to the app store, search "MyChart", open the app, select [place], and log in with your MyChart username and password.  Due to Covid, a mask is required upon entering the hospital/clinic. If you do not have a mask, one will be given to you upon arrival. For doctor visits, patients may have 1 support person aged 74 or older with them. For treatment visits, patients cannot have anyone with them due to current Covid guidelines and our immunocompromised population.

## 2020-09-01 ENCOUNTER — Inpatient Hospital Stay (HOSPITAL_COMMUNITY): Payer: Medicare Other | Admitting: Dietician

## 2020-09-01 ENCOUNTER — Encounter (HOSPITAL_COMMUNITY): Payer: Self-pay

## 2020-09-01 ENCOUNTER — Inpatient Hospital Stay (HOSPITAL_COMMUNITY): Payer: Medicare Other

## 2020-09-01 VITALS — BP 106/58 | HR 63 | Temp 96.3°F | Resp 18

## 2020-09-01 DIAGNOSIS — C3491 Malignant neoplasm of unspecified part of right bronchus or lung: Secondary | ICD-10-CM

## 2020-09-01 DIAGNOSIS — Z5111 Encounter for antineoplastic chemotherapy: Secondary | ICD-10-CM | POA: Diagnosis not present

## 2020-09-01 MED ORDER — SODIUM CHLORIDE 0.9% FLUSH
10.0000 mL | INTRAVENOUS | Status: DC | PRN
  Administered 2020-09-01: 10 mL

## 2020-09-01 MED ORDER — SODIUM CHLORIDE 0.9 % IV SOLN
8.0000 mg | Freq: Once | INTRAVENOUS | Status: AC
Start: 2020-09-01 — End: 2020-09-01
  Administered 2020-09-01: 8 mg via INTRAVENOUS
  Filled 2020-09-01: qty 8

## 2020-09-01 MED ORDER — SODIUM CHLORIDE 0.9 % IV SOLN: Freq: Once | INTRAVENOUS | Status: AC

## 2020-09-01 MED ORDER — SODIUM CHLORIDE 0.9 % IV SOLN: 8.0000 mg | Freq: Once | INTRAVENOUS | Status: DC

## 2020-09-01 MED ORDER — HEPARIN SOD (PORK) LOCK FLUSH 100 UNIT/ML IV SOLN
500.0000 [IU] | Freq: Once | INTRAVENOUS | Status: AC | PRN
  Administered 2020-09-01: 500 [IU]

## 2020-09-01 MED ORDER — TOPOTECAN HCL CHEMO INJECTION 4 MG
1.2000 mg/m2 | Freq: Once | INTRAVENOUS | Status: AC
  Administered 2020-09-01: 1.7 mg via INTRAVENOUS
  Filled 2020-09-01: qty 1.7

## 2020-09-01 MED ORDER — SODIUM CHLORIDE 0.9 % IV SOLN: INTRAVENOUS | Status: DC

## 2020-09-01 NOTE — Progress Notes (Signed)
Nutrition Follow-up:  Patient with lung cancer. She is receiving Topotecan and completed SRS radiation on 3/14  Met with patient and daughter in infusion. Patient eating bites of lunch during visit (mashed potatoes with gravy, greens, baked apples, roll) She is unable to eat anymore due to feeling nauseas despite IV Zofran and daughter removed food container from bedside. She reports feeling nauseas over the past few days and not eating much. She had a few bites of a banana split yesterday afternoon, and "not very much" of her spaghetti last night for dinner. Patient reports taking zofran, this continues to work well for her. Daughter continues to offer frequent meals and snacks.   Medications: reviewed  Labs: 5/16 Glucose 106, Cr 1.19, Albumin 3.3  Anthropometrics: Weight 99 lb 6.4 oz on 5/16 decreased 4 lbs from 103 lb 2 oz on 5/6  4/25 - 104 lb 4.4 oz 4/4 - 103 lb 9.6 oz 3/14 - 99 lb 6.4 oz 2/21 - 94 lb 3.2 oz   NUTRITION DIAGNOSIS: Inadequate oral intake ongoing   INTERVENTION:  Encouraged cold foods, room temperature foods when feeling nauseas Encouraged small frequent meals, trying to eat every 2-3 hours Reviewed high calorie, high protein snacks Continue zofran as prescribed Continue appetite stimulant    MONITORING, EVALUATION, GOAL: weight trends, intake   NEXT VISIT: Monday June 6

## 2020-09-01 NOTE — Patient Instructions (Signed)
[place]  Discharge Instructions: Thank you for choosing [place] to provide your oncology and hematology care.  If you have a lab appointment with the [facility], please come in thru the Main Entrance and check in at the main information desk.  Wear comfortable clothing and clothing appropriate for easy access to any Portacath or PICC line.   We strive to give you quality time with your provider. You may need to reschedule your appointment if you arrive late (15 or more minutes).  Arriving late affects you and other patients whose appointments are after yours.  Also, if you miss three or more appointments without notifying the office, you may be dismissed from the clinic at the provider's discretion.      For prescription refill requests, have your pharmacy contact our office and allow 72 hours for refills to be completed.    Today you received the following chemotherapy and/or immunotherapy agents Topotecan.   To help prevent nausea and vomiting after your treatment, we encourage you to take your nausea medication as directed.  BELOW ARE SYMPTOMS THAT SHOULD BE REPORTED IMMEDIATELY: . *FEVER GREATER THAN 100.4 F (38 C) OR HIGHER . *CHILLS OR SWEATING . *NAUSEA AND VOMITING THAT IS NOT CONTROLLED WITH YOUR NAUSEA MEDICATION . *UNUSUAL SHORTNESS OF BREATH . *UNUSUAL BRUISING OR BLEEDING . *URINARY PROBLEMS (pain or burning when urinating, or frequent urination) . *BOWEL PROBLEMS (unusual diarrhea, constipation, pain near the anus) . TENDERNESS IN MOUTH AND THROAT WITH OR WITHOUT PRESENCE OF ULCERS (sore throat, sores in mouth, or a toothache) . UNUSUAL RASH, SWELLING OR PAIN  . UNUSUAL VAGINAL DISCHARGE OR ITCHING   Items with * indicate a potential emergency and should be followed up as soon as possible or go to the Emergency Department if any problems should occur.  Please show the CHEMOTHERAPY ALERT CARD or IMMUNOTHERAPY ALERT CARD at check-in to  the Emergency Department and triage nurse.  Should you have questions after your visit or need to cancel or reschedule your appointment, please contact [facility] [phone]  and follow the prompts.  Office hours are 8:00 a.m. to 4:30 p.m. Monday - Friday. Please note that voicemails left after 4:00 p.m. may not be returned until the following business day.  We are closed weekends and major holidays. You have access to a nurse at all times for urgent questions. Please call the main number to the clinic [phone] and follow the prompts.  For any non-urgent questions, you may also contact your provider using MyChart. We now offer e-Visits for anyone 42 and older to request care online for non-urgent symptoms. For details visit [email or web address].   Also download the MyChart app! Go to the app store, search "MyChart", open the app, select [place], and log in with your MyChart username and password.  Due to Covid, a mask is required upon entering the hospital/clinic. If you do not have a mask, one will be given to you upon arrival. For doctor visits, patients may have 1 support person aged 56 or older with them. For treatment visits, patients cannot have anyone with them due to current Covid guidelines and our immunocompromised population.

## 2020-09-01 NOTE — Progress Notes (Signed)
Late entry:   Patient here for treatment today, patient's daughter concerned mother is sleeping more, not eating as well, not drinking much. Will notify MD. Will give NS bolus of 500 mls over 25min per MD order.   Instructed Daughter to give her mother the other nausea meds alternating with zofran. Also to let us know if this does not help

## 2020-09-01 NOTE — Progress Notes (Signed)
Patient tolerated chemotherapy with no complaints voiced. Side effects with management reviewed understanding verbalized. Port site clean and dry with no bruising or swelling noted at site. Good blood return noted before and after administration of chemotherapy.  Patient left in satisfactory condition with VSS and no s/s of distress noted.

## 2020-09-02 ENCOUNTER — Other Ambulatory Visit: Payer: Self-pay

## 2020-09-02 ENCOUNTER — Ambulatory Visit (HOSPITAL_COMMUNITY): Payer: Medicare Other

## 2020-09-02 ENCOUNTER — Inpatient Hospital Stay (HOSPITAL_COMMUNITY): Payer: Medicare Other

## 2020-09-02 ENCOUNTER — Other Ambulatory Visit (HOSPITAL_COMMUNITY): Payer: Self-pay | Admitting: Hematology

## 2020-09-02 VITALS — BP 112/62 | HR 76 | Temp 98.2°F | Resp 18

## 2020-09-02 DIAGNOSIS — C3491 Malignant neoplasm of unspecified part of right bronchus or lung: Secondary | ICD-10-CM

## 2020-09-02 DIAGNOSIS — Z5111 Encounter for antineoplastic chemotherapy: Secondary | ICD-10-CM | POA: Diagnosis not present

## 2020-09-02 MED ORDER — TOPOTECAN HCL CHEMO INJECTION 4 MG
1.2000 mg/m2 | Freq: Once | INTRAVENOUS | Status: AC
  Administered 2020-09-02: 1.7 mg via INTRAVENOUS
  Filled 2020-09-02: qty 1.7

## 2020-09-02 MED ORDER — SODIUM CHLORIDE 0.9 % IV SOLN
Freq: Once | INTRAVENOUS | Status: AC
Start: 2020-09-02 — End: 2020-09-02

## 2020-09-02 MED ORDER — HEPARIN SOD (PORK) LOCK FLUSH 100 UNIT/ML IV SOLN
500.0000 [IU] | Freq: Once | INTRAVENOUS | Status: AC | PRN
  Administered 2020-09-02: 500 [IU]

## 2020-09-02 MED ORDER — SODIUM CHLORIDE 0.9% FLUSH: 10.0000 mL | INTRAVENOUS | Status: DC | PRN

## 2020-09-02 MED ORDER — SODIUM CHLORIDE 0.9 % IV SOLN: INTRAVENOUS | Status: DC

## 2020-09-02 MED ORDER — SODIUM CHLORIDE 0.9 % IV SOLN
8.0000 mg | Freq: Once | INTRAVENOUS | Status: AC
  Administered 2020-09-02: 8 mg via INTRAVENOUS
  Filled 2020-09-02: qty 8

## 2020-09-02 MED ORDER — PROCHLORPERAZINE MALEATE 10 MG PO TABS
ORAL_TABLET | ORAL | 2 refills | Status: DC
Start: 2020-09-02 — End: 2020-09-26

## 2020-09-02 NOTE — Progress Notes (Signed)
Treatment given per orders. Patient tolerated it well without problems. Vitals stable and discharged home from clinic via wheelchair Follow up as scheduled.  

## 2020-09-02 NOTE — Patient Instructions (Signed)
[place]  Discharge Instructions: Thank you for choosing [place] to provide your oncology and hematology care.  If you have a lab appointment with the [place] [place], please come in thru the Main Entrance and check in at the main information desk.  Wear comfortable clothing and clothing appropriate for easy access to any Portacath or PICC line.   We strive to give you quality time with your provider. You may need to reschedule your appointment if you arrive late (15 or more minutes).  Arriving late affects you and other patients whose appointments are after yours.  Also, if you miss three or more appointments without notifying the office, you may be dismissed from the clinic at the provider's discretion.      For prescription refill requests, have your pharmacy contact our office and allow 72 hours for refills to be completed.    Today you received the following chemotherapy    To help prevent nausea and vomiting after your treatment, we encourage you to take your nausea medication as directed.  BELOW ARE SYMPTOMS THAT SHOULD BE REPORTED IMMEDIATELY: . *FEVER GREATER THAN 100.4 F (38 C) OR HIGHER . *CHILLS OR SWEATING . *NAUSEA AND VOMITING THAT IS NOT CONTROLLED WITH YOUR NAUSEA MEDICATION . *UNUSUAL SHORTNESS OF BREATH . *UNUSUAL BRUISING OR BLEEDING . *URINARY PROBLEMS (pain or burning when urinating, or frequent urination) . *BOWEL PROBLEMS (unusual diarrhea, constipation, pain near the anus) . TENDERNESS IN MOUTH AND THROAT WITH OR WITHOUT PRESENCE OF ULCERS (sore throat, sores in mouth, or a toothache) . UNUSUAL RASH, SWELLING OR PAIN  . UNUSUAL VAGINAL DISCHARGE OR ITCHING   Items with * indicate a potential emergency and should be followed up as soon as possible or go to the Emergency Department if any problems should occur.  Please show the CHEMOTHERAPY ALERT CARD or IMMUNOTHERAPY ALERT CARD at check-in to the Emergency Department and triage  nurse.  Should you have questions after your visit or need to cancel or reschedule your appointment, please contact [facility] [phone]  and follow the prompts.  Office hours are 8:00 a.m. to 4:30 p.m. Monday - Friday. Please note that voicemails left after 4:00 p.m. may not be returned until the following business day.  We are closed weekends and major holidays. You have access to a nurse at all times for urgent questions. Please call the main number to the clinic [phone] and follow the prompts.  For any non-urgent questions, you may also contact your provider using MyChart. We now offer e-Visits for anyone 72 and older to request care online for non-urgent symptoms. For details visit [email or web address].   Also download the MyChart app! Go to the app store, search "MyChart", open the app, select [place], and log in with your MyChart username and password.  Due to Covid, a mask is required upon entering the hospital/clinic. If you do not have a mask, one will be given to you upon arrival. For doctor visits, patients may have 1 support person aged 56 or older with them. For treatment visits, patients cannot have anyone with them due to current Covid guidelines and our immunocompromised population.

## 2020-09-05 ENCOUNTER — Telehealth: Payer: Self-pay

## 2020-09-05 NOTE — Telephone Encounter (Signed)
Received VM from [place] with [facility], requesting verbal orders for PT/OT sessions as recommended by therapist's evaluation.   Returned Stacy's call and provided verbal confirmation per Dr. Isidore Moos. Before hanging up, Marzetta Board stated she received a "level 2 alert" regarding patient's metoclopramide and prochlorperazine prescriptions, stating "one reduces the efficacy of the other". Informed her I would make patient's medical oncologist aware in case they wanted to change orders. No other needs identified at this time.

## 2020-09-06 ENCOUNTER — Other Ambulatory Visit: Payer: Self-pay

## 2020-09-06 ENCOUNTER — Encounter (HOSPITAL_COMMUNITY): Payer: Self-pay | Admitting: *Deleted

## 2020-09-06 ENCOUNTER — Emergency Department (HOSPITAL_COMMUNITY): Payer: Medicare Other

## 2020-09-06 ENCOUNTER — Inpatient Hospital Stay (HOSPITAL_COMMUNITY)
Admission: EM | Admit: 2020-09-06 | Discharge: 2020-09-09 | DRG: 871 | Disposition: A | Discharge status: Home Health | Payer: Medicare Other | Attending: Family Medicine | Admitting: Family Medicine

## 2020-09-06 ENCOUNTER — Inpatient Hospital Stay (HOSPITAL_COMMUNITY): Payer: Medicare Other

## 2020-09-06 DIAGNOSIS — E876 Hypokalemia: Secondary | ICD-10-CM | POA: Diagnosis present

## 2020-09-06 DIAGNOSIS — Z20822 Contact with and (suspected) exposure to covid-19: Secondary | ICD-10-CM | POA: Diagnosis present

## 2020-09-06 DIAGNOSIS — E785 Hyperlipidemia, unspecified: Secondary | ICD-10-CM | POA: Diagnosis present

## 2020-09-06 DIAGNOSIS — I7300 Raynaud's syndrome without gangrene: Secondary | ICD-10-CM | POA: Diagnosis present

## 2020-09-06 DIAGNOSIS — I2510 Atherosclerotic heart disease of native coronary artery without angina pectoris: Secondary | ICD-10-CM | POA: Diagnosis present

## 2020-09-06 DIAGNOSIS — E861 Hypovolemia: Secondary | ICD-10-CM | POA: Diagnosis present

## 2020-09-06 DIAGNOSIS — Z85820 Personal history of malignant melanoma of skin: Secondary | ICD-10-CM

## 2020-09-06 DIAGNOSIS — I130 Hypertensive heart and chronic kidney disease with heart failure and stage 1 through stage 4 chronic kidney disease, or unspecified chronic kidney disease: Secondary | ICD-10-CM | POA: Diagnosis present

## 2020-09-06 DIAGNOSIS — N179 Acute kidney failure, unspecified: Secondary | ICD-10-CM | POA: Diagnosis present

## 2020-09-06 DIAGNOSIS — Z853 Personal history of malignant neoplasm of breast: Secondary | ICD-10-CM

## 2020-09-06 DIAGNOSIS — T451X5A Adverse effect of antineoplastic and immunosuppressive drugs, initial encounter: Secondary | ICD-10-CM | POA: Diagnosis present

## 2020-09-06 DIAGNOSIS — J189 Pneumonia, unspecified organism: Secondary | ICD-10-CM | POA: Diagnosis present

## 2020-09-06 DIAGNOSIS — C3491 Malignant neoplasm of unspecified part of right bronchus or lung: Secondary | ICD-10-CM

## 2020-09-06 DIAGNOSIS — C7931 Secondary malignant neoplasm of brain: Secondary | ICD-10-CM | POA: Diagnosis present

## 2020-09-06 DIAGNOSIS — R54 Age-related physical debility: Secondary | ICD-10-CM | POA: Diagnosis present

## 2020-09-06 DIAGNOSIS — E872 Acidosis: Secondary | ICD-10-CM | POA: Diagnosis present

## 2020-09-06 DIAGNOSIS — K219 Gastro-esophageal reflux disease without esophagitis: Secondary | ICD-10-CM | POA: Diagnosis present

## 2020-09-06 DIAGNOSIS — I509 Heart failure, unspecified: Secondary | ICD-10-CM | POA: Diagnosis present

## 2020-09-06 DIAGNOSIS — Z9012 Acquired absence of left breast and nipple: Secondary | ICD-10-CM

## 2020-09-06 DIAGNOSIS — E559 Vitamin D deficiency, unspecified: Secondary | ICD-10-CM | POA: Diagnosis present

## 2020-09-06 DIAGNOSIS — Z87891 Personal history of nicotine dependence: Secondary | ICD-10-CM

## 2020-09-06 DIAGNOSIS — Z7189 Other specified counseling: Secondary | ICD-10-CM | POA: Diagnosis not present

## 2020-09-06 DIAGNOSIS — C3490 Malignant neoplasm of unspecified part of unspecified bronchus or lung: Secondary | ICD-10-CM | POA: Diagnosis present

## 2020-09-06 DIAGNOSIS — R5081 Fever presenting with conditions classified elsewhere: Secondary | ICD-10-CM

## 2020-09-06 DIAGNOSIS — G9341 Metabolic encephalopathy: Secondary | ICD-10-CM | POA: Diagnosis present

## 2020-09-06 DIAGNOSIS — D6481 Anemia due to antineoplastic chemotherapy: Secondary | ICD-10-CM | POA: Diagnosis present

## 2020-09-06 DIAGNOSIS — A419 Sepsis, unspecified organism: Secondary | ICD-10-CM | POA: Diagnosis present

## 2020-09-06 DIAGNOSIS — J439 Emphysema, unspecified: Secondary | ICD-10-CM | POA: Diagnosis present

## 2020-09-06 DIAGNOSIS — D709 Neutropenia, unspecified: Secondary | ICD-10-CM | POA: Diagnosis present

## 2020-09-06 DIAGNOSIS — Z8249 Family history of ischemic heart disease and other diseases of the circulatory system: Secondary | ICD-10-CM | POA: Diagnosis not present

## 2020-09-06 DIAGNOSIS — E7800 Pure hypercholesterolemia, unspecified: Secondary | ICD-10-CM | POA: Diagnosis present

## 2020-09-06 DIAGNOSIS — E860 Dehydration: Secondary | ICD-10-CM | POA: Diagnosis present

## 2020-09-06 DIAGNOSIS — Z515 Encounter for palliative care: Secondary | ICD-10-CM | POA: Diagnosis not present

## 2020-09-06 DIAGNOSIS — Z981 Arthrodesis status: Secondary | ICD-10-CM

## 2020-09-06 DIAGNOSIS — Z7982 Long term (current) use of aspirin: Secondary | ICD-10-CM

## 2020-09-06 DIAGNOSIS — N189 Chronic kidney disease, unspecified: Secondary | ICD-10-CM | POA: Diagnosis present

## 2020-09-06 DIAGNOSIS — Z825 Family history of asthma and other chronic lower respiratory diseases: Secondary | ICD-10-CM

## 2020-09-06 DIAGNOSIS — Z880 Allergy status to penicillin: Secondary | ICD-10-CM

## 2020-09-06 DIAGNOSIS — Z79899 Other long term (current) drug therapy: Secondary | ICD-10-CM

## 2020-09-06 LAB — COMPREHENSIVE METABOLIC PANEL
ALT: 14 U/L (ref 0–44)
ALT: 13 U/L (ref 0–44)
AST: 18 U/L (ref 15–41)
AST: 19 U/L (ref 15–41)
Albumin: 3.5 g/dL (ref 3.5–5.0)
Albumin: 3.3 g/dL — ABNORMAL LOW (ref 3.5–5.0)
Alkaline Phosphatase: 48 U/L (ref 38–126)
Alkaline Phosphatase: 46 U/L (ref 38–126)
Anion gap: 10 (ref 5–15)
Anion gap: 9 (ref 5–15)
BUN: 51 mg/dL — ABNORMAL HIGH (ref 8–23)
BUN: 52 mg/dL — ABNORMAL HIGH (ref 8–23)
CO2: 24 mmol/L (ref 22–32)
CO2: 24 mmol/L (ref 22–32)
Calcium: 8.6 mg/dL — ABNORMAL LOW (ref 8.9–10.3)
Calcium: 8.4 mg/dL — ABNORMAL LOW (ref 8.9–10.3)
Chloride: 102 mmol/L (ref 98–111)
Chloride: 104 mmol/L (ref 98–111)
Creatinine, Ser: 2.01 mg/dL — ABNORMAL HIGH (ref 0.44–1.00)
Creatinine, Ser: 2.05 mg/dL — ABNORMAL HIGH (ref 0.44–1.00)
GFR, Estimated: 26 mL/min — ABNORMAL LOW (ref >60)
GFR, Estimated: 25 mL/min — ABNORMAL LOW (ref >60)
Glucose, Bld: 132 mg/dL — ABNORMAL HIGH (ref 70–99)
Glucose, Bld: 124 mg/dL — ABNORMAL HIGH (ref 70–99)
Potassium: 3.4 mmol/L — ABNORMAL LOW (ref 3.5–5.1)
Potassium: 3.3 mmol/L — ABNORMAL LOW (ref 3.5–5.1)
Sodium: 136 mmol/L (ref 135–145)
Sodium: 137 mmol/L (ref 135–145)
Total Bilirubin: 0.8 mg/dL (ref 0.3–1.2)
Total Bilirubin: 0.5 mg/dL (ref 0.3–1.2)
Total Protein: 6.5 g/dL (ref 6.5–8.1)
Total Protein: 6.3 g/dL — ABNORMAL LOW (ref 6.5–8.1)

## 2020-09-06 LAB — RAPID URINE DRUG SCREEN, HOSP PERFORMED
Amphetamines: NOT DETECTED
Barbiturates: NOT DETECTED
Benzodiazepines: NOT DETECTED
Cocaine: NOT DETECTED
Opiates: POSITIVE — AB
Tetrahydrocannabinol: NOT DETECTED

## 2020-09-06 LAB — CBC WITH DIFFERENTIAL/PLATELET
Basophils Absolute: 0 10*3/uL (ref 0.0–0.1)
Basophils Absolute: 0 10*3/uL (ref 0.0–0.1)
Basophils Relative: 0 %
Basophils Relative: 0 %
Eosinophils Absolute: 0 10*3/uL (ref 0.0–0.5)
Eosinophils Absolute: 0 10*3/uL (ref 0.0–0.5)
Eosinophils Relative: 0 %
Eosinophils Relative: 0 %
HCT: 25.5 % — ABNORMAL LOW (ref 36.0–46.0)
HCT: 23.4 % — ABNORMAL LOW (ref 36.0–46.0)
Hemoglobin: 8 g/dL — ABNORMAL LOW (ref 12.0–15.0)
Hemoglobin: 7.3 g/dL — ABNORMAL LOW (ref 12.0–15.0)
Lymphocytes Relative: 88 %
Lymphocytes Relative: 56 %
Lymphs Abs: 0.9 10*3/uL (ref 0.7–4.0)
Lymphs Abs: 0.2 10*3/uL — ABNORMAL LOW (ref 0.7–4.0)
MCH: 30.9 pg (ref 26.0–34.0)
MCH: 30.7 pg (ref 26.0–34.0)
MCHC: 31.4 g/dL (ref 30.0–36.0)
MCHC: 31.2 g/dL (ref 30.0–36.0)
MCV: 98.5 fL (ref 80.0–100.0)
MCV: 98.3 fL (ref 80.0–100.0)
Monocytes Absolute: 0 10*3/uL — ABNORMAL LOW (ref 0.1–1.0)
Monocytes Absolute: 0 10*3/uL — ABNORMAL LOW (ref 0.1–1.0)
Monocytes Relative: 0 %
Monocytes Relative: 0 %
Neutro Abs: 0.1 10*3/uL — CL (ref 1.7–7.7)
Neutro Abs: 0.1 10*3/uL — CL (ref 1.7–7.7)
Neutrophils Relative %: 12 %
Neutrophils Relative %: 44 %
Platelets: 419 10*3/uL — ABNORMAL HIGH (ref 150–400)
Platelets: 320 10*3/uL (ref 150–400)
RBC: 2.59 MIL/uL — ABNORMAL LOW (ref 3.87–5.11)
RBC: 2.38 MIL/uL — ABNORMAL LOW (ref 3.87–5.11)
RDW: 21.2 % — ABNORMAL HIGH (ref 11.5–15.5)
RDW: 21.1 % — ABNORMAL HIGH (ref 11.5–15.5)
WBC: 1 10*3/uL — CL (ref 4.0–10.5)
WBC: 0.3 10*3/uL — CL (ref 4.0–10.5)
nRBC: 0 % (ref 0.0–0.2)
nRBC: 0 % (ref 0.0–0.2)

## 2020-09-06 LAB — URINALYSIS, ROUTINE W REFLEX MICROSCOPIC
Bilirubin Urine: NEGATIVE
Glucose, UA: NEGATIVE mg/dL
Hgb urine dipstick: NEGATIVE
Ketones, ur: NEGATIVE mg/dL
Leukocytes,Ua: NEGATIVE
Nitrite: NEGATIVE
Protein, ur: 30 mg/dL — AB
Specific Gravity, Urine: 1.019 (ref 1.005–1.030)
pH: 5 (ref 5.0–8.0)

## 2020-09-06 LAB — AMMONIA: Ammonia: 11 umol/L (ref 9–35)

## 2020-09-06 LAB — APTT: aPTT: 43 seconds — ABNORMAL HIGH (ref 24–36)

## 2020-09-06 LAB — LACTIC ACID, PLASMA
Lactic Acid, Venous: 2.7 mmol/L (ref 0.5–1.9)
Lactic Acid, Venous: 2.7 mmol/L (ref 0.5–1.9)

## 2020-09-06 LAB — RESP PANEL BY RT-PCR (FLU A&B, COVID) ARPGX2
Influenza A by PCR: NEGATIVE
Influenza B by PCR: NEGATIVE
SARS Coronavirus 2 by RT PCR: NEGATIVE

## 2020-09-06 LAB — MAGNESIUM: Magnesium: 1.8 mg/dL (ref 1.7–2.4)

## 2020-09-06 LAB — PROTIME-INR
INR: 1.2 (ref 0.8–1.2)
Prothrombin Time: 14.8 seconds (ref 11.4–15.2)

## 2020-09-06 MED ORDER — SODIUM CHLORIDE 0.9 % IV SOLN
1.0000 g | Freq: Three times a day (TID) | INTRAVENOUS | Status: DC
  Administered 2020-09-06 – 2020-09-07 (×2): 1 g via INTRAVENOUS
  Filled 2020-09-06: qty 1

## 2020-09-06 MED ORDER — SUCRALFATE 1 G PO TABS
1.0000 g | ORAL_TABLET | Freq: Three times a day (TID) | ORAL | Status: DC
  Administered 2020-09-07 – 2020-09-09 (×7): 1 g via ORAL
  Filled 2020-09-06 (×7): qty 1

## 2020-09-06 MED ORDER — GABAPENTIN 600 MG PO TABS
600.0000 mg | ORAL_TABLET | Freq: Every day | ORAL | Status: DC
  Filled 2020-09-06 (×4): qty 1

## 2020-09-06 MED ORDER — LACTATED RINGERS IV SOLN: INTRAVENOUS | Status: AC

## 2020-09-06 MED ORDER — LEVOFLOXACIN IN D5W 750 MG/150ML IV SOLN
750.0000 mg | Freq: Once | INTRAVENOUS | Status: DC
  Filled 2020-09-06: qty 150

## 2020-09-06 MED ORDER — MECLIZINE HCL 12.5 MG PO TABS: 12.5000 mg | ORAL_TABLET | Freq: Two times a day (BID) | ORAL | Status: DC | PRN

## 2020-09-06 MED ORDER — SODIUM CHLORIDE 0.9 % IV BOLUS
1300.0000 mL | Freq: Once | INTRAVENOUS | Status: AC
  Administered 2020-09-06: 1300 mL via INTRAVENOUS

## 2020-09-06 MED ORDER — VANCOMYCIN HCL IN DEXTROSE 1-5 GM/200ML-% IV SOLN
1000.0000 mg | Freq: Once | INTRAVENOUS | Status: AC
  Administered 2020-09-06: 1000 mg via INTRAVENOUS
  Filled 2020-09-06: qty 200

## 2020-09-06 MED ORDER — MIRTAZAPINE 15 MG PO TABS
15.0000 mg | ORAL_TABLET | Freq: Every day | ORAL | Status: DC
  Administered 2020-09-07 – 2020-09-08 (×2): 15 mg via ORAL
  Filled 2020-09-06 (×2): qty 1

## 2020-09-06 MED ORDER — TRAMADOL HCL 50 MG PO TABS: 50.0000 mg | ORAL_TABLET | Freq: Four times a day (QID) | ORAL | Status: DC | PRN

## 2020-09-06 MED ORDER — POLYETHYLENE GLYCOL 3350 17 G PO PACK: 17.0000 g | PACK | Freq: Every day | ORAL | Status: DC | PRN

## 2020-09-06 MED ORDER — SODIUM CHLORIDE 0.9 % IV SOLN
1.0000 g | Freq: Two times a day (BID) | INTRAVENOUS | Status: DC
  Administered 2020-09-07 – 2020-09-09 (×4): 1 g via INTRAVENOUS
  Filled 2020-09-06 (×5): qty 1

## 2020-09-06 MED ORDER — ACETAMINOPHEN 325 MG PO TABS
650.0000 mg | ORAL_TABLET | Freq: Four times a day (QID) | ORAL | Status: DC | PRN
Start: 2020-09-06 — End: 2020-09-09

## 2020-09-06 MED ORDER — SODIUM CHLORIDE 0.9 % IV SOLN
2.0000 g | Freq: Once | INTRAVENOUS | Status: DC
  Filled 2020-09-06: qty 2

## 2020-09-06 MED ORDER — ROSUVASTATIN CALCIUM 20 MG PO TABS
40.0000 mg | ORAL_TABLET | Freq: Every day | ORAL | Status: DC
  Administered 2020-09-07 – 2020-09-08 (×2): 40 mg via ORAL
  Filled 2020-09-06 (×2): qty 2

## 2020-09-06 MED ORDER — METHOCARBAMOL 500 MG PO TABS
500.0000 mg | ORAL_TABLET | Freq: Four times a day (QID) | ORAL | Status: DC | PRN
  Administered 2020-09-07 – 2020-09-08 (×3): 500 mg via ORAL
  Filled 2020-09-06 (×3): qty 1

## 2020-09-06 MED ORDER — GUAIFENESIN ER 600 MG PO TB12: 600.0000 mg | ORAL_TABLET | Freq: Two times a day (BID) | ORAL | Status: DC | PRN

## 2020-09-06 MED ORDER — SODIUM CHLORIDE 0.9 % IV BOLUS
1000.0000 mL | Freq: Once | INTRAVENOUS | Status: AC
  Administered 2020-09-06: 1000 mL via INTRAVENOUS

## 2020-09-06 MED ORDER — OXYCODONE HCL 5 MG PO TABS
5.0000 mg | ORAL_TABLET | ORAL | Status: DC | PRN
  Administered 2020-09-07 – 2020-09-08 (×5): 5 mg via ORAL
  Filled 2020-09-06 (×5): qty 1

## 2020-09-06 MED ORDER — IPRATROPIUM-ALBUTEROL 0.5-2.5 (3) MG/3ML IN SOLN: 3.0000 mL | Freq: Four times a day (QID) | RESPIRATORY_TRACT | Status: DC | PRN

## 2020-09-06 MED ORDER — POTASSIUM CHLORIDE 10 MEQ/100ML IV SOLN
10.0000 meq | INTRAVENOUS | Status: AC
  Administered 2020-09-06 – 2020-09-07 (×3): 10 meq via INTRAVENOUS
  Filled 2020-09-06 (×3): qty 100

## 2020-09-06 MED ORDER — HYDROCORTISONE NA SUCCINATE PF 100 MG IJ SOLR
50.0000 mg | Freq: Four times a day (QID) | INTRAMUSCULAR | Status: DC
  Administered 2020-09-06 – 2020-09-09 (×11): 50 mg via INTRAVENOUS
  Filled 2020-09-06 (×11): qty 2

## 2020-09-06 MED ORDER — ALPRAZOLAM 0.25 MG PO TABS
0.2500 mg | ORAL_TABLET | Freq: Two times a day (BID) | ORAL | Status: DC | PRN
  Administered 2020-09-08: 0.25 mg via ORAL
  Filled 2020-09-06: qty 1

## 2020-09-06 MED ORDER — HEPARIN SODIUM (PORCINE) 5000 UNIT/ML IJ SOLN
5000.0000 [IU] | Freq: Three times a day (TID) | INTRAMUSCULAR | Status: DC
  Filled 2020-09-06 (×2): qty 1

## 2020-09-06 MED ORDER — ACETAMINOPHEN 650 MG RE SUPP: 650.0000 mg | Freq: Four times a day (QID) | RECTAL | Status: DC | PRN

## 2020-09-06 MED ORDER — ASPIRIN EC 81 MG PO TBEC
81.0000 mg | DELAYED_RELEASE_TABLET | Freq: Every day | ORAL | Status: DC
  Administered 2020-09-07 – 2020-09-09 (×3): 81 mg via ORAL
  Filled 2020-09-06 (×3): qty 1

## 2020-09-06 MED ORDER — VANCOMYCIN HCL 500 MG/100ML IV SOLN
500.0000 mg | INTRAVENOUS | Status: DC
  Filled 2020-09-06: qty 100

## 2020-09-06 MED ORDER — ACETAMINOPHEN 325 MG PO TABS
650.0000 mg | ORAL_TABLET | Freq: Once | ORAL | Status: AC
  Administered 2020-09-06: 650 mg via ORAL
  Filled 2020-09-06: qty 2

## 2020-09-06 MED ORDER — ALBUTEROL SULFATE (2.5 MG/3ML) 0.083% IN NEBU
2.5000 mg | INHALATION_SOLUTION | Freq: Four times a day (QID) | RESPIRATORY_TRACT | Status: DC
  Administered 2020-09-07: 2.5 mg via RESPIRATORY_TRACT
  Filled 2020-09-06: qty 3

## 2020-09-06 NOTE — ED Provider Notes (Signed)
[facility] Provider Note   CSN: [id number] Arrival date & time: 09/06/20  1439     History Chief Complaint  Patient presents with  . Altered Mental Status    Ashley [name] is a 70 y.o. female.  HPI   This patient is a 70 year old female, she has a history of adenocarcinoma of the breast as well as metastatic disease of the lung, non-small cell lung cancer.  According to the daughter who is the primary historian due to the patient's altered mental status she had been her normal self yesterday but woke this morning with some hallucinations altered mental status confusion and not wanting to eat or drink.  This is very unusual.  There is no fevers, she last had an infusion on May 20, she presented today for another infusion for her cancer but was found to be confused altered and abnormal in appearance that she was sent to the emergency department.  Cancer: Right upper lobectomy December 30, 2018 Radiation to the whole brain October 2020 through October 2020 Carboplatin and etoposide 4 cycles ending January 2021 Lurbinectedin 7 cycles ending January 2022 SBRT to brain metastases in 3 fractions between March 9 and June 27, 2020  Last PET scan was performed Aug 16, 2020, showed improved mediastinal and right perihilar nodes, no hypermetabolic activity to suggest skeletal metastasis  Brain MRI performed June 14, 2020 showed decreased size of dominant enhancing lesion on the atrium of the right lateral ventricle, subependymal extension of enhancement along the right occipital horn which appeared minimally increased in bulk with slightly increased surrounding edema at that time.  The patient is not able to give me any information.  There is no report of fevers, the family member does report having infections which cause altered mental status in the past  Past Medical History:  Diagnosis Date  . Adenocarcinoma of left breast ([place]) 01/09/2016  . Anginal pain ([place])   .  Arthritis   . Ascending aortic aneurysm ([place])   . Cancer [facility]) 1995   breast, left, mastectomy/chemo  . Chest pain    Possibly cardiac. No evidence of ischemia/injury based upon normal troponin I. Chest discomfort could be tachycardia induced supply demand mismatch.   . CHF (congestive heart failure) ([place]) 11/17/2015   after surgery   . Colon adenomas   . Coronary artery disease   . DJD (degenerative joint disease)   . Dyspnea    with exertion  . Emphysema of lung ([place]) 11/17/2015  . Essential hypertension, benign   . GERD (gastroesophageal reflux disease)   . History of hiatal hernia   . Hyperlipidemia   . Hypertension   . Melanoma ([place]) 01/09/2016  . Osteopenia   . Palpitations   . Pernicious anemia 03/06/2016  . Pernicious anemia   . Pure hypercholesterolemia   . Raynaud's disease   . Thrombocythemia, essential ([place]) 01/09/2016  . Thrombocytosis    Idiopathic  . Vitamin D deficiency     Patient Active Problem List   Diagnosis Date Noted  . Brain metastasis ([place]) 06/03/2020  . Abdominal aortic aneurysm without rupture ([place]) 05/05/2020  . Acquired absence of both cervix and uterus 05/05/2020  . Acute renal failure ([place]) 05/05/2020  . Allergic contact dermatitis due to plants, except food 05/05/2020  . Benign hypertension 05/05/2020  . Body mass index (BMI) 19.9 or less, adult 05/05/2020  . Chronic pain 05/05/2020  . Chronic pancreatitis ([place]) 05/05/2020  . Disorder of intestine 05/05/2020  . Fatigue 05/05/2020  .  Liver disease 05/05/2020  . History of left mastectomy 05/05/2020  . Hyperglycemia 05/05/2020  . Left flank pain 05/05/2020  . Macrocytic anemia 05/05/2020  . Malignant neoplasm of female breast ([place]) 05/05/2020  . Mixed hyperlipidemia 05/05/2020  . Osteopenia 05/05/2020  . Peripheral edema 05/05/2020  . Status post lobectomy of lung 05/05/2020  . Upper abdominal pain 05/05/2020  . Vitamin D deficiency 05/05/2020  . Weight loss 05/05/2020  .  Gastroesophageal reflux disease 05/05/2020  . Hearing loss 12/28/2019  . History of external beam radiation therapy 12/28/2019  . Goals of care, counseling/discussion 11/30/2019  . Small vessel coronary artery disease 06/23/2019  . CKD (chronic kidney disease) 03/09/2019  . Nodule of upper lobe of right lung 12/30/2018  . 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 Pneumothorax after biopsy 11/21/2018  . History of breast cancer 11/21/2018  . 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 Hiatal hernia 02/25/2017  . Tubular adenoma of colon 02/25/2017  . Abnormal CT of the chest 02/18/2017  . Esophageal dysphagia 02/18/2017  . Pernicious anemia 03/06/2016  . Dyspnea   . Non-ischemic cardiomyopathy ([place])   . Adenocarcinoma of left breast ([place]) 01/09/2016  . Melanoma ([place]) 01/09/2016  . Thrombocythemia, essential ([place]) 01/09/2016  . Chronic obstructive pulmonary disease with (acute) exacerbation ([place]) 11/17/2015  . Panlobular emphysema ([place]) 11/17/2015  . CHF exacerbation ([place]) 11/17/2015  . Radiculopathy 11/16/2015  . Acute right-sided low back pain with sciatica 07/19/2015  . Multiple pulmonary nodules 01/18/2015  . Hypokalemia 01/18/2015  . Status post vaginal hysterectomy 12/09/2014  . BRCA negative 07/21/2014    Past Surgical History:  Procedure Laterality Date  . ABDOMINAL HYSTERECTOMY    . ANTERIOR AND POSTERIOR REPAIR N/A 12/09/2014   Procedure: ANTERIOR (CYSTOCELE) AND POSTERIOR REPAIR (RECTOCELE);  Surgeon: Bjorn Loser, MD;  Location: [facility];  Service: Urology;  Laterality: N/A;  . ANTERIOR CERVICAL DECOMPRESSION/DISCECTOMY FUSION 4 LEVELS Right 10/03/2016   Procedure: ANTERIOR CERVICAL DECOMPRESSION FUSION, CERVICAL 4-5, CERVICAL 5-6, CERVICAL 6-7, CERVICAL 7 TO THORACIC 1 WITH INSTRUMENTATION AND ALLOGRAFT;  Surgeon: Phylliss Bob, MD;  Location: [facility];  Service: Orthopedics;  Laterality: Right;  ANTERIOR CERVICAL DECOMPRESSION FUSION,  CERVICAL 4-5, CERVICAL 5-6, CERVICAL 6-7, CERVICAL 7 TO THORACIC 1 WITH INSTRUMENTATION AND ALLOGRAFT; REQUEST 4 HO  . ANTERIOR LAT LUMBAR FUSION Left 11/16/2015   Procedure: LEFT SIDED LATERAL INTERBODY FUSION, LUMBAR 2-3, LUMBAR 3-4, LUMBAR 4-5 WITH INSTRUMENTATION;  Surgeon: Phylliss Bob, MD;  Location: [facility];  Service: Orthopedics;  Laterality: Left;  LEFT SIDED LATEARL INTERBODY FUSION, LUMBAR 2-3, LUMBAR 3-4, LUMBAR 4-5 WITH INSTRUMENTATION   . APPENDECTOMY    . BACK SURGERY    . BONE MARROW ASPIRATION  07/2012  . BONE MARROW BIOPSY  07/2012  . BREAST SURGERY    . CARDIAC CATHETERIZATION    . CARDIAC CATHETERIZATION N/A 01/20/2016   Procedure: Left Heart Cath and Coronary Angiography;  Surgeon: Burnell Blanks, MD;  Location: [facility];  Service: Cardiovascular;  Laterality: N/A;  . COLONOSCOPY  11/29/2010   Procedure: COLONOSCOPY;  Surgeon: Rogene Houston, MD;  [facility];  Service: Endoscopy;  Laterality: N/A;  . COLONOSCOPY N/A 02/18/2014   Procedure: COLONOSCOPY;  Surgeon: Rogene Houston, MD;  [facility];  Service: Endoscopy;  Laterality: N/A;  1030  . COLONOSCOPY N/A 02/25/2017   Procedure: COLONOSCOPY;  Surgeon: Rogene Houston, MD;  [facility];  Service: Endoscopy;  Laterality: N/A;  10:55  .  CYSTOSCOPY N/A 12/09/2014   Procedure: CYSTOSCOPY;  Surgeon: Bjorn Loser, MD;  Location: [facility];  Service: Urology;  Laterality: N/A;  . ESOPHAGEAL DILATION N/A 02/25/2017   Procedure: ESOPHAGEAL DILATION;  Surgeon: Rogene Houston, MD;  [facility];  Service: Endoscopy;  Laterality: N/A;  . ESOPHAGOGASTRODUODENOSCOPY N/A 02/25/2017   Procedure: ESOPHAGOGASTRODUODENOSCOPY (EGD);  Surgeon: Rogene Houston, MD;  [facility];  Service: Endoscopy;  Laterality: N/A;  . IR GENERIC HISTORICAL  01/11/2016   IR RADIOLOGY PERIPHERAL GUIDED IV START 01/11/2016 Saverio Danker, PA-C [facility]  . IR GENERIC HISTORICAL   01/11/2016   IR US GUIDE VASC ACCESS RIGHT 01/11/2016 Saverio Danker, PA-C [facility]  . LYMPH NODE DISSECTION Right 12/30/2018   Procedure: Lymph Node Dissection;  Surgeon: Lajuana Matte, MD;  Location: [facility];  Service: Thoracic;  Laterality: Right;  . MASTECTOMY     left  . OVARIAN CYST SURGERY     x2  . POLYPECTOMY  02/25/2017   Procedure: POLYPECTOMY;  Surgeon: Rogene Houston, MD;  [facility];  Service: Endoscopy;;  . PORTACATH PLACEMENT Left 02/16/2019   Procedure: INSERTION PORT-A-CATH (CATHETER  LEFT SUBCLAVIAN);  Surgeon: Aviva Signs, MD;  Location: [facility];  Service: General;  Laterality: Left;  . SALPINGOOPHORECTOMY Bilateral 12/09/2014   Procedure: SALPINGO OOPHORECTOMY;  Surgeon: Servando Salina, MD;  Location: [facility];  Service: Gynecology;  Laterality: Bilateral;  . TUBAL LIGATION    . VAGINAL HYSTERECTOMY N/A 12/09/2014   Procedure: HYSTERECTOMY VAGINAL;  Surgeon: Servando Salina, MD;  Location: [facility];  Service: Gynecology;  Laterality: N/A;  . VIDEO ASSISTED THORACOSCOPY (VATS)/ LOBECTOMY Right 12/30/2018   Procedure: VIDEO ASSISTED THORACOSCOPY (VATS)/RIGHT LOWER LOBE WEDGE RESECTION, RIGHT UPPER LOBECTOMY;  Surgeon: Lajuana Matte, MD;  Location: [facility];  Service: Thoracic;  Laterality: Right;  Marland Kitchen VIDEO BRONCHOSCOPY N/A 12/30/2018   Procedure: VIDEO BRONCHOSCOPY;  Surgeon: Lajuana Matte, MD;  Location[facility];  Service: Thoracic;  Laterality: N/A;     OB History   No obstetric history on file.     Family History  Problem Relation Age of Onset  . Hypertension Mother   . Heart failure Mother   . Congestive Heart Failure Mother   . COPD Mother   . Pernicious anemia Mother   . Cancer Mother        lung  . Hypertension Father   . CAD Father   . Heart attack Father   . Hypertension Sister   . Cancer Other   . Celiac disease Other     Social History   Tobacco Use  . Smoking status: Former Smoker    Packs/day: 0.50    Years:  50.00    Pack years: 25.00    Types: Cigarettes    Quit date: 11/25/2018    Years since quitting: 1.7  . Smokeless tobacco: Never Used  Vaping Use  . Vaping Use: Never used  Substance Use Topics  . Alcohol use: Yes    Comment: occasional  . Drug use: No    Home Medications Prior to Admission medications   Medication Sig Start Date End Date Taking? Authorizing Provider  albuterol (PROVENTIL HFA;VENTOLIN HFA) 108 (90 Base) MCG/ACT inhaler Inhale 2 puffs into the lungs every 6 (six) hours as needed for wheezing or shortness of breath. 05/01/17   Mannam, Hart Robinsons, MD  albuterol (PROVENTIL) (2.5 MG/3ML) 0.083% nebulizer solution Take 2.5 mg by nebulization every 6 (six) hours. 12/01/19   [provider]  ALPRAZolam (XANAX) 0.5 MG tablet Take one tablet prior to MRIs and/or radiation oncology procedures. 06/03/20   Eppie Gibson, MD  aspirin EC 81 MG tablet Take 81 mg by mouth daily.    [provider]  Calcium Carb-Cholecalciferol 600-800 MG-UNIT TABS Take 1 tablet by mouth 2 (two) times daily.    [provider]  Cholecalciferol (VITAMIN D-3) 1000 units CAPS Take 1,000 Units daily by mouth.     [provider]  clonazePAM (KLONOPIN) 0.5 MG tablet Take 1 tablet (0.5 mg total) by mouth at bedtime. Take 1 and half tablets by mouth at bedtime. 08/08/20   Derek Jack, MD  cyanocobalamin (,VITAMIN B-12,) 1000 MCG/ML injection INJECT 1 ML INTO THE MUSCLE ONCE MONTHLY AS DIRECTED. Patient taking differently: Inject 1,000 mcg into the muscle every 30 (thirty) days. 09/26/16   Baird Cancer, PA-C  dexamethasone (DECADRON) 2 MG tablet Take 1 tablet (2 mg total) by mouth daily. 07/18/20   Derek Jack, MD  dexamethasone (DECADRON) 4 MG tablet Take by mouth. 07/20/20   [provider]  diclofenac Sodium (VOLTAREN) 1 % GEL Apply 2 grams to hands 02/24/18   [provider]  ergocalciferol (VITAMIN D2) 1.25 MG (50000 UT) capsule Take by mouth.     [provider]  furosemide (LASIX) 40 MG tablet 1/2 in Am and 1 tablet in PM    [provider]  gabapentin (NEURONTIN) 600 MG tablet Take 1 tablet (600 mg total) by mouth daily. 09/21/19   Derek Jack, MD  guaiFENesin (MUCINEX) 600 MG 12 hr tablet Take 1 tablet (600 mg total) by mouth 2 (two) times daily as needed for cough or to loosen phlegm. 01/07/19   Nani Skillern, PA-C  ibandronate (BONIVA) 150 MG tablet Take 150 mg by mouth every 30 (thirty) days.  07/14/12   [provider]  ibuprofen (ADVIL) 800 MG tablet Take 1 tablet (800 mg total) by mouth every 8 (eight) hours as needed for moderate pain. 05/05/20   Derek Jack, MD  ipratropium (ATROVENT) 0.02 % nebulizer solution Take by nebulization. 11/24/19   [provider]  ipratropium-albuterol (DUONEB) 0.5-2.5 (3) MG/3ML SOLN Take 3 mLs by nebulization every 6 (six) hours as needed (shortness of breath). 11/21/15   Arrien, Jimmy Picket, MD  lidocaine-prilocaine (EMLA) cream Apply a small amount to port a cath site and cover with plastic wrap 1 hour prior to chemotherapy appointments 08/08/20   Derek Jack, MD  magic mouthwash w/lidocaine SOLN 10 cc swab and swallow 07/20/20   Derek Jack, MD  meclizine (ANTIVERT) 12.5 MG tablet Take by mouth.    [provider]  methocarbamol (ROBAXIN) 500 MG tablet Take 500 mg by mouth every 6 (six) hours as needed for muscle spasms.     [provider]  metoCLOPramide (REGLAN) 5 MG tablet 1 tablet before meals 02/24/18   [provider]  mirtazapine (REMERON) 15 MG tablet TAKE (1) TABLET BY MOUTH AT BEDTIME. 09/02/20   Derek Jack, MD  nitroGLYCERIN (NITRODUR - DOSED IN MG/24 HR) 0.1 mg/hr patch Place 0.1 mg onto the skin daily. 06/23/19   [provider]  ondansetron (ZOFRAN ODT) 8 MG disintegrating tablet Take 1 tablet (8 mg total) by mouth every 8 (eight) hours as needed for nausea or  vomiting. 07/18/20   Derek Jack, MD  oxyCODONE (OXY IR/ROXICODONE) 5 MG immediate release tablet Take 1 tablet (5 mg total) by mouth every 8 (eight) hours. 08/08/20   Delton Coombes,  Dirk Dress, MD  OXYGEN Inhale 3 L into the lungs daily.    [provider]  pantoprazole (PROTONIX) 40 MG tablet TAKE ONE TABLET BY MOUTH TWICE DAILY. 01/01/20   Lockamy, Randi L, NP-C  polyethylene glycol (MIRALAX / GLYCOLAX) 17 g packet Take 17 g by mouth daily as needed for mild constipation.    [provider]  potassium chloride SA (KLOR-CON) 20 MEQ tablet Take 1 tablet (20 mEq total) by mouth 3 (three) times daily. 08/08/20   Derek Jack, MD  prochlorperazine (COMPAZINE) 10 MG tablet 1 tablet as needed 09/02/20   Derek Jack, MD  rizatriptan (MAXALT) 10 MG tablet Take 10 mg by mouth 2 (two) times daily. 1-2 times daily May repeat in 2 hours if needed    [provider]  rosuvastatin (CRESTOR) 40 MG tablet Take 40 mg by mouth at bedtime.    [provider]  sucralfate (CARAFATE) 1 g tablet Take 1 g by mouth 3 (three) times daily. 04/14/20   [provider]  traMADol (ULTRAM) 50 MG tablet Take 50 mg by mouth every 6 (six) hours as needed. 04/21/19   [provider]    Allergies    Penicillins  Review of Systems   Review of Systems  Unable to perform ROS: Mental status change    Physical Exam Updated Vital Signs Ht 1.575 m ('5\' 2"' )   Wt 44.9 kg   BMI 18.11 kg/m   Physical Exam Vitals and nursing note reviewed.  Constitutional:      General: She is not in acute distress.    Appearance: She is well-developed. She is ill-appearing.     Comments: Somnolent but arousable  HENT:     Head: Normocephalic and atraumatic.     Nose: Nose normal. No congestion or rhinorrhea.     Mouth/Throat:     Mouth: Mucous membranes are dry.     Pharynx: No oropharyngeal exudate.  Eyes:     General: No scleral icterus.       Right eye: No discharge.         Left eye: No discharge.     Conjunctiva/sclera: Conjunctivae normal.     Pupils: Pupils are equal, round, and reactive to light.  Neck:     Thyroid: No thyromegaly.     Vascular: No JVD.  Cardiovascular:     Rate and Rhythm: Normal rate and regular rhythm.     Heart sounds: Normal heart sounds. No murmur heard. No friction rub. No gallop.   Pulmonary:     Effort: Pulmonary effort is normal. No respiratory distress.     Breath sounds: Normal breath sounds. No wheezing or rales.     Comments: Slightly abnormal lung sounds in the right upper lobe, otherwise no increased work of breathing or distress Abdominal:     General: Bowel sounds are normal. There is no distension.     Palpations: Abdomen is soft. There is no mass.     Tenderness: There is no abdominal tenderness.  Musculoskeletal:        General: No tenderness. Normal range of motion.     Cervical back: Normal range of motion and neck supple.     Right lower leg: No edema.     Left lower leg: No edema.  Lymphadenopathy:     Cervical: No cervical adenopathy.  Skin:    General: Skin is warm and dry.     Coloration: Skin is not jaundiced or pale.     Findings:  No erythema or rash.  Neurological:     Coordination: Coordination normal.     Comments: The patient is able to open her eyes transiently to voice, she will assist in sitting up in the bed with minimal difficulty, she has difficulty staying awake, she will not execute normal neurologic function such as squeezing my hands bilaterally but will lift both arms and attempt to touch my hands both sides, her answers are simple yes and knows and do not always make sense with the question asked.  She has a tremor mostly on the right side, the family member states this is chronic     ED Results / Procedures / Treatments   Labs (all labs ordered are listed, but only abnormal results are displayed) Labs Reviewed - No data to display  EKG None  Radiology No results  found.  Procedures .Critical Care Performed by: Noemi Chapel, MD Authorized by: Noemi Chapel, MD   Critical care provider statement:    Critical care time (minutes):  35   Critical care time was exclusive of:  Separately billable procedures and treating other patients and teaching time   Critical care was necessary to treat or prevent imminent or life-threatening deterioration of the following conditions:  Sepsis   Critical care was time spent personally by me on the following activities:  Blood draw for specimens, development of treatment plan with patient or surrogate, discussions with consultants, evaluation of patient's response to treatment, examination of patient, obtaining history from patient or surrogate, ordering and performing treatments and interventions, ordering and review of laboratory studies, ordering and review of radiographic studies, pulse oximetry, re-evaluation of patient's condition and review of old charts     Medications Ordered in ED Medications  lactated ringers infusion (has no administration in time range)  levofloxacin (LEVAQUIN) IVPB 750 mg (has no administration in time range)    ED Course  I have reviewed the triage vital signs and the nursing notes.  Pertinent labs & imaging results that were available during my care of the patient were reviewed by me and considered in my medical decision making (see chart for details).  Clinical Course as of 09/07/20 1817  Tue Sep 06, 2020  1538 I have personally viewed the chest x-ray, there does appear to be a torturous aorta, she does appear to have some infiltrate on the lungs at the left base, most recent x-ray to compare to was Aug 21, 2019 [BM]  1539 Rectal temperature 101.1, she is tachycardic to 118 and tachypneic at 26 breaths/min, will treat for neutropenic fever or sepsis.  Patient is critically ill [BM]    Clinical Course User Index [BM] Noemi Chapel, MD   MDM Rules/Calculators/A&P                           It is difficult to tell whether this patient has an infection and has tremor and altered mental status or whether this is related to some other cause.  We will check electrolytes ammonia CT scan of the brain given the known history of brain metastasis, will check for infection including cultures lactic acid urinalysis and a chest x-ray.  The patient has reasonable pulses, she does not appear to be hypotensive nor does she appear to be tachycardic, she is dry in the mouth, IV fluids will be given.  According to the patient's daughter she is a full code at this time, she does not have DNR paperwork.  The  patient is not able to make that decision by herself thus I am relying on the patient's reported history to her daughter.  The patient is critically ill with altered mental status, possible sepsis, she is immunocompromise  Hypotensive - given 30cc/kg fluids Cultures and alctic acid obtained Pt is neutropenic with fever Admitted to hospitalilst - critically ill.   Final Clinical Impression(s) / ED Diagnoses Final diagnoses:  Sepsis, due to unspecified organism, unspecified whether acute organ dysfunction present [facility])  Neutropenic fever ([place])      Noemi Chapel, MD 09/07/20 1819

## 2020-09-06 NOTE — ED Triage Notes (Signed)
Patient arrives from cancer center, c/o confusion, hallucinations, sleeping more than usual.

## 2020-09-06 NOTE — Telephone Encounter (Signed)
Made patient aware via My Chart.  Unable to reach by phone.

## 2020-09-06 NOTE — H&P (Signed)
TRH H&P    Patient Demographics:    Ashley [name], is a 70 y.o. female  MRN: [id number]  DOB - 03-25-1951  Admit Date - 09/06/2020  Referring MD/NP/PA: [name]  Outpatient Primary MD for the patient is [name], NP  Patient coming from: Home  Chief complaint- Altered mental status   HPI:    Ashley [name]  is a 70 y.o. female, with history of vitamin D deficiency, non-small cell lung cancer on chemotherapy, hypertension, hyperlipidemia, GERD, degenerative joint disease, coronary artery disease, CHF, and more presents the ED with a chief complaint of altered mental status.  Unfortunately patient is not able to provide any history, but daughter at bedside reports that patient has been overly fatigued and confused at home.  Daughter reports that patient did have chemotherapy last week, she gets chemotherapy every 3 weeks.  The week after her chemo treatment, it is normal for her to be fatigued.  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  Daughter feels like her confusion has been worse and her fatigue has been worse than what is her normal.  She reports that patient is normally able to recognize family, but sometimes has trouble with word finding and memory.  She is aware of these issues and it becomes very frustrating for her.  Last night patient had an episode where she did not recognize one of her daughters, but it seemed to improve, that happened again this morning.  Patient was not frustrated during the visit as she did not realize she was confused like she normally does.  Daughter called Dr. Raliegh Ip who recommended going to the ER per daughter report.  Daughter does report that she has had episodes of confusion like this in the past when she has had an infection.  She also reports that the patient has not been eating or drinking normally.  When she is on her Remeron, her  appetite seems much improved.  Somehow she had not been taking her Remeron for about a week.  Daughter reports that at least 3 days or patient can even take 1 bite of food.  Daughter reports the patient has not had any increase in her wheeze or cough.  Oxygen saturations are reading low in the room, daughter reports that patient has Raynaud's and O2 sats are usually not accurate with pulse ox.  Daughter reports that patient has been dehydrated due to the poor p.o. intake, and received fluid boluses last Thursday and Friday with her chemo.  As far as daughter knows patient has not complained of any pains or symptoms that are a change from her normal.  Patient is a former smoker, does not drink alcohol, does not use illicit drugs.  Patient is vaccinated for COVID.  According to daughter patient would like to remain full code, but palliative care consult may be warranted  In the ED Temperature as high as 101.1, heart rate 90-118, respiratory rate 12-26, blood pressure trending down at admission, currently 82/59, satting at 95% Leukopenia with  a white blood cell count of 0.3, hemoglobin 7.3, hematocrit 23.4, but platelets within normal at 320 Chemistry panel reveals hypokalemia at 3.3, and an AKI with a creatinine of 2.01.  8 days ago creatinine was 1.19 CT head recommends MRI to better characterize metastatic lesion that was seen previously, but not well visualized on this image Chest x-ray shows left lung base opacities EKG shows sinus tachycardia 111, QTc 487 1.3 L bolus in ED, 1 L bolus at admission, continue LR at 150 MLS per hour Tylenol given for fever Pharmacy consulted for dosing of vancomycin and Merrem UA and UC pending Blood culture pending Admission requested for further evaluation and treatment of neutropenic fever    Review of systems:    Unfortunately review of systems could not be obtained secondary to patient's altered mental status    Past History of the following :    Past  Medical History:  Diagnosis Date  . 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 Anginal pain ([place])   . Arthritis   . Ascending aortic aneurysm ([place])   . Cancer [facility]) 1995   breast, left, mastectomy/chemo  . Chest pain    Possibly cardiac. No evidence of ischemia/injury based upon normal troponin I. Chest discomfort could be tachycardia induced supply demand mismatch.   . CHF (congestive heart failure) ([place]) 11/17/2015   after surgery   . Colon adenomas   . Coronary artery disease   . DJD (degenerative joint disease)   . Dyspnea    with exertion  . Emphysema of lung ([place]) 11/17/2015  . Essential hypertension, benign   . GERD (gastroesophageal reflux disease)   . History of hiatal hernia   . Hyperlipidemia   . Hypertension   . Melanoma ([place]) 01/09/2016  . Osteopenia   . Palpitations   . Pernicious anemia 03/06/2016  . Pernicious anemia   . Pure hypercholesterolemia   . Raynaud's disease   . Thrombocythemia, essential ([place]) 01/09/2016  . Thrombocytosis    Idiopathic  . Vitamin D deficiency       Past Surgical History:  Procedure Laterality Date  . ABDOMINAL HYSTERECTOMY    . ANTERIOR AND POSTERIOR REPAIR N/A 12/09/2014   Procedure: ANTERIOR (CYSTOCELE) AND POSTERIOR REPAIR (RECTOCELE);  Surgeon: Bjorn Loser, MD;  Location: [facility];  Service: Urology;  Laterality: N/A;  . ANTERIOR CERVICAL DECOMPRESSION/DISCECTOMY FUSION 4 LEVELS Right 10/03/2016   Procedure: ANTERIOR CERVICAL DECOMPRESSION FUSION, CERVICAL 4-5, CERVICAL 5-6, CERVICAL 6-7, CERVICAL 7 TO THORACIC 1 WITH INSTRUMENTATION AND ALLOGRAFT;  Surgeon: Phylliss Bob, MD;  Location: [facility];  Service: Orthopedics;  Laterality: Right;  ANTERIOR CERVICAL DECOMPRESSION FUSION, CERVICAL 4-5, CERVICAL 5-6, CERVICAL 6-7, CERVICAL 7 TO THORACIC 1 WITH INSTRUMENTATION AND ALLOGRAFT; REQUEST 4 HO  . ANTERIOR LAT LUMBAR FUSION Left 11/16/2015   Procedure: LEFT SIDED LATERAL INTERBODY FUSION, LUMBAR 2-3, LUMBAR 3-4, LUMBAR 4-5 WITH  INSTRUMENTATION;  Surgeon: Phylliss Bob, MD;  Location: [facility];  Service: Orthopedics;  Laterality: Left;  LEFT SIDED LATEARL INTERBODY FUSION, LUMBAR 2-3, LUMBAR 3-4, LUMBAR 4-5 WITH INSTRUMENTATION   . APPENDECTOMY    . BACK SURGERY    . BONE MARROW ASPIRATION  07/2012  . BONE MARROW BIOPSY  07/2012  . BREAST SURGERY    . CARDIAC CATHETERIZATION    . CARDIAC CATHETERIZATION N/A 01/20/2016   Procedure: Left Heart Cath and Coronary Angiography;  Surgeon: Burnell Blanks, MD;  Location: [facility];  Service: Cardiovascular;  Laterality: N/A;  . COLONOSCOPY  11/29/2010  Procedure: COLONOSCOPY;  Surgeon: Rogene Houston, MD;  [facility];  Service: Endoscopy;  Laterality: N/A;  . COLONOSCOPY N/A 02/18/2014   Procedure: COLONOSCOPY;  Surgeon: Rogene Houston, MD;  [facility];  Service: Endoscopy;  Laterality: N/A;  1030  . COLONOSCOPY N/A 02/25/2017   Procedure: COLONOSCOPY;  Surgeon: Rogene Houston, MD;  [facility];  Service: Endoscopy;  Laterality: N/A;  10:55  . CYSTOSCOPY N/A 12/09/2014   Procedure: CYSTOSCOPY;  Surgeon: Bjorn Loser, MD;  Location: [facility];  Service: Urology;  Laterality: N/A;  . ESOPHAGEAL DILATION N/A 02/25/2017   Procedure: ESOPHAGEAL DILATION;  Surgeon: Rogene Houston, MD;  [facility];  Service: Endoscopy;  Laterality: N/A;  . ESOPHAGOGASTRODUODENOSCOPY N/A 02/25/2017   Procedure: ESOPHAGOGASTRODUODENOSCOPY (EGD);  Surgeon: Rogene Houston, MD;  [facility];  Service: Endoscopy;  Laterality: N/A;  . IR GENERIC HISTORICAL  01/11/2016   IR RADIOLOGY PERIPHERAL GUIDED IV START 01/11/2016 Saverio Danker, PA-C [facility]  . IR GENERIC HISTORICAL  01/11/2016   IR US GUIDE VASC ACCESS RIGHT 01/11/2016 Saverio Danker, PA-C [facility]  . LYMPH NODE DISSECTION Right 12/30/2018   Procedure: Lymph Node Dissection;  Surgeon: Lajuana Matte, MD;  Location: [facility];  Service: Thoracic;  Laterality:  Right;  . MASTECTOMY     left  . OVARIAN CYST SURGERY     x2  . POLYPECTOMY  02/25/2017   Procedure: POLYPECTOMY;  Surgeon: Rogene Houston, MD;  [facility];  Service: Endoscopy;;  . PORTACATH PLACEMENT Left 02/16/2019   Procedure: INSERTION PORT-A-CATH (CATHETER  LEFT SUBCLAVIAN);  Surgeon: Aviva Signs, MD;  Location[facility];  Service: General;  Laterality: Left;  . SALPINGOOPHORECTOMY Bilateral 12/09/2014   Procedure: SALPINGO OOPHORECTOMY;  Surgeon: Servando Salina, MD;  Location: [facility];  Service: Gynecology;  Laterality: Bilateral;  . TUBAL LIGATION    . VAGINAL HYSTERECTOMY N/A 12/09/2014   Procedure: HYSTERECTOMY VAGINAL;  Surgeon: Servando Salina, MD;  Location: [facility];  Service: Gynecology;  Laterality: N/A;  . VIDEO ASSISTED THORACOSCOPY (VATS)/ LOBECTOMY Right 12/30/2018   Procedure: VIDEO ASSISTED THORACOSCOPY (VATS)/RIGHT LOWER LOBE WEDGE RESECTION, RIGHT UPPER LOBECTOMY;  Surgeon: Lajuana Matte, MD;  Location: [facility];  Service: Thoracic;  Laterality: Right;  Marland Kitchen VIDEO BRONCHOSCOPY N/A 12/30/2018   Procedure: VIDEO BRONCHOSCOPY;  Surgeon: Lajuana Matte, MD;  [facility];  Service: Thoracic;  Laterality: N/A;      Social History:      Social History   Tobacco Use  . Smoking status: Former Smoker    Packs/day: 0.50    Years: 50.00    Pack years: 25.00    Types: Cigarettes    Quit date: 11/25/2018    Years since quitting: 1.7  . Smokeless tobacco: Never Used  Substance Use Topics  . Alcohol use: Yes    Comment: occasional       Family History :     Family History  Problem Relation Age of Onset  . Hypertension Mother   . Heart failure Mother   . Congestive Heart Failure Mother   . COPD Mother   . Pernicious anemia Mother   . Cancer Mother        lung  . Hypertension Father   . CAD Father   . Heart attack Father   . Hypertension Sister   . Cancer Other   . Celiac disease Other      Home Medications:  Prior to  Admission medications   Medication Sig Start Date End Date Taking? Authorizing Provider  albuterol (PROVENTIL HFA;VENTOLIN HFA) 108 (90 Base) MCG/ACT inhaler Inhale 2 puffs into the lungs every 6 (six) hours as needed for wheezing or shortness of breath. 05/01/17   Mannam, Hart Robinsons, MD  albuterol (PROVENTIL) (2.5 MG/3ML) 0.083% nebulizer solution Take 2.5 mg by nebulization every 6 (six) hours. 12/01/19   [provider]  ALPRAZolam Duanne Moron) 0.5 MG tablet Take one tablet prior to MRIs and/or radiation oncology procedures. 06/03/20   Eppie Gibson, MD  aspirin EC 81 MG tablet Take 81 mg by mouth daily.    [provider]  Calcium Carb-Cholecalciferol 600-800 MG-UNIT TABS Take 1 tablet by mouth 2 (two) times daily.    [provider]  Cholecalciferol (VITAMIN D-3) 1000 units CAPS Take 1,000 Units daily by mouth.     [provider]  clonazePAM (KLONOPIN) 0.5 MG tablet Take 1 tablet (0.5 mg total) by mouth at bedtime. Take 1 and half tablets by mouth at bedtime. 08/08/20   Derek Jack, MD  cyanocobalamin (,VITAMIN B-12,) 1000 MCG/ML injection INJECT 1 ML INTO THE MUSCLE ONCE MONTHLY AS DIRECTED. Patient taking differently: Inject 1,000 mcg into the muscle every 30 (thirty) days. 09/26/16   Baird Cancer, PA-C  dexamethasone (DECADRON) 2 MG tablet Take 1 tablet (2 mg total) by mouth daily. 07/18/20   Derek Jack, MD  dexamethasone (DECADRON) 4 MG tablet Take by mouth. 07/20/20   [provider]  diclofenac Sodium (VOLTAREN) 1 % GEL Apply 2 grams to hands 02/24/18   [provider]  ergocalciferol (VITAMIN D2) 1.25 MG (50000 UT) capsule Take by mouth.    [provider]  furosemide (LASIX) 40 MG tablet 1/2 in Am and 1 tablet in PM    [provider]  gabapentin (NEURONTIN) 600 MG tablet Take 1 tablet (600 mg total) by mouth daily. 09/21/19   Derek Jack, MD  guaiFENesin (MUCINEX) 600 MG 12 hr tablet Take 1 tablet  (600 mg total) by mouth 2 (two) times daily as needed for cough or to loosen phlegm. 01/07/19   Nani Skillern, PA-C  ibandronate (BONIVA) 150 MG tablet Take 150 mg by mouth every 30 (thirty) days.  07/14/12   [provider]  ibuprofen (ADVIL) 800 MG tablet Take 1 tablet (800 mg total) by mouth every 8 (eight) hours as needed for moderate pain. 05/05/20   Derek Jack, MD  ipratropium (ATROVENT) 0.02 % nebulizer solution Take by nebulization. 11/24/19   [provider]  ipratropium-albuterol (DUONEB) 0.5-2.5 (3) MG/3ML SOLN Take 3 mLs by nebulization every 6 (six) hours as needed (shortness of breath). 11/21/15   Arrien, Jimmy Picket, MD  lidocaine-prilocaine (EMLA) cream Apply a small amount to port a cath site and cover with plastic wrap 1 hour prior to chemotherapy appointments 08/08/20   Derek Jack, MD  magic mouthwash w/lidocaine SOLN 10 cc swab and swallow 07/20/20   Derek Jack, MD  meclizine (ANTIVERT) 12.5 MG tablet Take by mouth.    [provider]  methocarbamol (ROBAXIN) 500 MG tablet Take 500 mg by mouth every 6 (six) hours as needed for muscle spasms.     [provider]  metoCLOPramide (REGLAN) 5 MG tablet 1 tablet before meals 02/24/18   [provider]  mirtazapine (REMERON) 15 MG tablet TAKE (1) TABLET BY MOUTH AT BEDTIME. 09/02/20   Derek Jack, MD  nitroGLYCERIN (NITRODUR - DOSED IN MG/24 HR) 0.1 mg/hr patch  Place 0.1 mg onto the skin daily. 06/23/19   [provider]  ondansetron (ZOFRAN ODT) 8 MG disintegrating tablet Take 1 tablet (8 mg total) by mouth every 8 (eight) hours as needed for nausea or vomiting. 07/18/20   Derek Jack, MD  oxyCODONE (OXY IR/ROXICODONE) 5 MG immediate release tablet Take 1 tablet (5 mg total) by mouth every 8 (eight) hours. 08/08/20   Derek Jack, MD  OXYGEN Inhale 3 L into the lungs daily.    [provider]  pantoprazole (PROTONIX) 40 MG  tablet TAKE ONE TABLET BY MOUTH TWICE DAILY. 01/01/20   Lockamy, Randi L, NP-C  polyethylene glycol (MIRALAX / GLYCOLAX) 17 g packet Take 17 g by mouth daily as needed for mild constipation.    [provider]  potassium chloride SA (KLOR-CON) 20 MEQ tablet Take 1 tablet (20 mEq total) by mouth 3 (three) times daily. 08/08/20   Derek Jack, MD  prochlorperazine (COMPAZINE) 10 MG tablet 1 tablet as needed 09/02/20   Derek Jack, MD  rizatriptan (MAXALT) 10 MG tablet Take 10 mg by mouth 2 (two) times daily. 1-2 times daily May repeat in 2 hours if needed    [provider]  rosuvastatin (CRESTOR) 40 MG tablet Take 40 mg by mouth at bedtime.    [provider]  sucralfate (CARAFATE) 1 g tablet Take 1 g by mouth 3 (three) times daily. 04/14/20   [provider]  traMADol (ULTRAM) 50 MG tablet Take 50 mg by mouth every 6 (six) hours as needed. 04/21/19   [provider]     Allergies:     Allergies  Allergen Reactions  . Penicillins Hives and Rash    Did it involve swelling of the face/tongue/throat, SOB, or low BP? No Did it involve sudden or severe rash/hives, skin peeling, or any reaction on the inside of your mouth or nose? No Did you need to seek medical attention at a hospital or doctor's office? No When did it last happen?5-10 year If all above answers are "NO", may proceed with cephalosporin use.       Physical Exam:   Vitals  Blood pressure (!) 75/54, pulse 84, temperature 98.8 F (37.1 C), temperature source Oral, resp. rate 12, height '5\' 2"'  (1.575 m), weight 44.9 kg, SpO2 94 %.  1.  General: Patient lying supine in bed, asleep, no acute distress  2. Psychiatric: Patient is currently nonverbal  3. Neurologic: Face is symmetric, patient opens eyes to light touch, patient intermittently following commands, patient moves all 4 extremities, patient drowsy, patient nonverbal so orientation cannot be assessed  4.  HEENMT:  Head is atraumatic, normocephalic, pupils are reactive to light, neck is cachectic, trachea is midline, mucous membranes are mildly dry  5. Respiratory : Bilateral rhonchi, wheezing greater on left than right, no crackles, no cyanosis, clubbing present, weak cough observed  6. Cardiovascular : Heart rate is normal at the time of my exam, no murmurs rubs or gallops, no peripheral edema, peripheral pulses palpated  7. Gastrointestinal:  Abdomen is soft, nondistended, nontender to palpation, bowel sounds present  8. Skin:  Skin is dry, warm, no acute lesion on limited exam  9.Musculoskeletal:  No calf tenderness, no acute deformity, peripheral pulses palpated    Data Review:    CBC Recent Labs  Lab 09/06/20 1424 09/06/20 1625  WBC 1.0* 0.3*  HGB 8.0* 7.3*  HCT 25.5* 23.4*  PLT 419* 320  MCV 98.5 98.3  MCH 30.9 30.7  MCHC  31.4 31.2  RDW 21.2* 21.1*  LYMPHSABS 0.9 0.2*  MONOABS 0.0* 0.0*  EOSABS 0.0 0.0  BASOSABS 0.0 0.0   ------------------------------------------------------------------------------------------------------------------  Results for orders placed or performed during the hospital encounter of 09/06/20 (from the past 48 hour(s))  Lactic acid, plasma     Status: Abnormal   Collection Time: 09/06/20  4:25 PM  Result Value Ref Range   Lactic Acid, Venous 2.7 (HH) 0.5 - 1.9 mmol/L    Comment: CRITICAL RESULT CALLED TO, READ BACK BY AND VERIFIED WITH: CRAWFORD,H ON 09/06/20 AT 1725 BY LOY,C Performed at [facility], [address] [place], [place] [address]   Comprehensive metabolic panel     Status: Abnormal   Collection Time: 09/06/20  4:25 PM  Result Value Ref Range   Sodium 137 135 - 145 mmol/L   Potassium 3.3 (L) 3.5 - 5.1 mmol/L   Chloride 104 98 - 111 mmol/L   CO2 24 22 - 32 mmol/L   Glucose, Bld 124 (H) 70 - 99 mg/dL    Comment: Glucose reference range applies only to samples taken after fasting for at least 8 hours.   BUN 52 (H) 8 - 23  mg/dL   Creatinine, Ser 2.05 (H) 0.44 - 1.00 mg/dL   Calcium 8.4 (L) 8.9 - 10.3 mg/dL   Total Protein 6.3 (L) 6.5 - 8.1 g/dL   Albumin 3.3 (L) 3.5 - 5.0 g/dL   AST 19 15 - 41 U/L   ALT 13 0 - 44 U/L   Alkaline Phosphatase 46 38 - 126 U/L   Total Bilirubin 0.5 0.3 - 1.2 mg/dL   GFR, Estimated 25 (L) >60 mL/min    Comment: (NOTE) Calculated using the CKD-EPI Creatinine Equation (2021)    Anion gap 9 5 - 15    Comment: Performed at [facility], [address], [place], [place] [address]  CBC WITH DIFFERENTIAL     Status: Abnormal   Collection Time: 09/06/20  4:25 PM  Result Value Ref Range   WBC 0.3 (LL) 4.0 - 10.5 K/uL    Comment: This critical result has verified and been called to [facility] CRAWFORD by Charlie Pitter on 05 24 2022 at 1727, and has been read back.    RBC 2.38 (L) 3.87 - 5.11 MIL/uL   Hemoglobin 7.3 (L) 12.0 - 15.0 g/dL   HCT 23.4 (L) 36.0 - 46.0 %   MCV 98.3 80.0 - 100.0 fL   MCH 30.7 26.0 - 34.0 pg   MCHC 31.2 30.0 - 36.0 g/dL   RDW 21.1 (H) 11.5 - 15.5 %   Platelets 320 150 - 400 K/uL   nRBC 0.0 0.0 - 0.2 %   Neutrophils Relative % 44 %   Neutro Abs 0.1 (LL) 1.7 - 7.7 K/uL    Comment: This critical result has verified and been called to [facility] CRAWFORD by Charlie Pitter on 05 24 2022 at 1727, and has been read back.    Lymphocytes Relative 56 %   Lymphs Abs 0.2 (L) 0.7 - 4.0 K/uL   Monocytes Relative 0 %   Monocytes Absolute 0.0 (L) 0.1 - 1.0 K/uL   Eosinophils Relative 0 %   Eosinophils Absolute 0.0 0.0 - 0.5 K/uL   Basophils Relative 0 %   Basophils Absolute 0.0 0.0 - 0.1 K/uL   Reactive, Benign Lymphocytes PRESENT     Comment: Performed at [facility], [address] [place], [place] [address]  Protime-INR     Status: None  Collection Time: 09/06/20  4:25 PM  Result Value Ref Range   Prothrombin Time 14.8 11.4 - 15.2 seconds   INR 1.2 0.8 - 1.2    Comment: (NOTE) INR goal varies based on device and disease states. Performed at [facility],  [address], [place], [place] [address]   APTT     Status: Abnormal   Collection Time: 09/06/20  4:25 PM  Result Value Ref Range   aPTT 43 (H) 24 - 36 seconds    Comment:        IF BASELINE aPTT IS ELEVATED, SUGGEST PATIENT RISK ASSESSMENT BE USED TO DETERMINE APPROPRIATE ANTICOAGULANT THERAPY. Performed at [facility], [address], [place], [place] [address]   Blood culture (routine single)     Status: None (Preliminary result)   Collection Time: 09/06/20  4:25 PM   Specimen: BLOOD LEFT ARM  Result Value Ref Range   Specimen Description BLOOD LEFT ARM    Special Requests      BOTTLES DRAWN AEROBIC AND ANAEROBIC Blood Culture adequate volume Performed at [facility], [address], [place], [place] [address]    Culture PENDING    Report Status PENDING   Ammonia     Status: None   Collection Time: 09/06/20  4:25 PM  Result Value Ref Range   Ammonia 11 9 - 35 umol/L    Comment: Performed at [facility], [address], [place], [place] [address]  Urinalysis, Routine w reflex microscopic Urine, Catheterized     Status: Abnormal   Collection Time: 09/06/20  5:26 PM  Result Value Ref Range   Color, Urine AMBER (A) YELLOW    Comment: BIOCHEMICALS MAY BE AFFECTED BY COLOR   APPearance HAZY (A) CLEAR   Specific Gravity, Urine 1.019 1.005 - 1.030   pH 5.0 5.0 - 8.0   Glucose, UA NEGATIVE NEGATIVE mg/dL   Hgb urine dipstick NEGATIVE NEGATIVE   Bilirubin Urine NEGATIVE NEGATIVE   Ketones, ur NEGATIVE NEGATIVE mg/dL   Protein, ur 30 (A) NEGATIVE mg/dL   Nitrite NEGATIVE NEGATIVE   Leukocytes,Ua NEGATIVE NEGATIVE   RBC / HPF 0-5 0 - 5 RBC/hpf   WBC, UA 0-5 0 - 5 WBC/hpf   Bacteria, UA RARE (A) NONE SEEN   Squamous Epithelial / LPF 0-5 0 - 5   Mucus PRESENT    Hyaline Casts, UA PRESENT     Comment: Performed at [facility], [address], [place], [place] [address]  Rapid urine drug screen (hospital performed)     Status: Abnormal   Collection Time: 09/06/20  5:26 PM  Result  Value Ref Range   Opiates POSITIVE (A) NONE DETECTED   Cocaine NONE DETECTED NONE DETECTED   Benzodiazepines NONE DETECTED NONE DETECTED   Amphetamines NONE DETECTED NONE DETECTED   Tetrahydrocannabinol NONE DETECTED NONE DETECTED   Barbiturates NONE DETECTED NONE DETECTED    Comment: (NOTE) DRUG SCREEN FOR MEDICAL PURPOSES ONLY.  IF CONFIRMATION IS NEEDED FOR ANY PURPOSE, NOTIFY LAB WITHIN 5 DAYS.  LOWEST DETECTABLE LIMITS FOR URINE DRUG SCREEN Drug Class                     Cutoff (ng/mL) Amphetamine and metabolites    1000 Barbiturate and metabolites    200 Benzodiazepine                 130 Tricyclics and metabolites     300 Opiates and metabolites        300 Cocaine  and metabolites        300 THC                            50 Performed at [facility], [address] [place], [place] [address]   Lactic acid, plasma     Status: Abnormal   Collection Time: 09/06/20  6:15 PM  Result Value Ref Range   Lactic Acid, Venous 2.7 (HH) 0.5 - 1.9 mmol/L    Comment: CRITICAL VALUE NOTED.  VALUE IS CONSISTENT WITH PREVIOUSLY REPORTED AND CALLED VALUE. Performed at [facility], [address], [place], [place] [address]   Resp Panel by RT-PCR (Flu A&B, Covid) Nasopharyngeal Swab     Status: None   Collection Time: 09/06/20  6:44 PM   Specimen: Nasopharyngeal Swab; Nasopharyngeal(NP) swabs in vial transport medium  Result Value Ref Range   SARS Coronavirus 2 by RT PCR NEGATIVE NEGATIVE    Comment: (NOTE) SARS-CoV-2 target nucleic acids are NOT DETECTED.  The SARS-CoV-2 RNA is generally detectable in upper respiratory specimens during the acute phase of infection. The lowest concentration of SARS-CoV-2 viral copies this assay can detect is 138 copies/mL. A negative result does not preclude SARS-Cov-2 infection and should not be used as the sole basis for treatment or other patient management decisions. A negative result may occur with  improper specimen collection/handling,  submission of specimen other than nasopharyngeal swab, presence of viral mutation(s) within the areas targeted by this assay, and inadequate number of viral copies(<138 copies/mL). A negative result must be combined with clinical observations, patient history, and epidemiological information. The expected result is Negative.  Fact Sheet for Patients:  EntrepreneurPulse.com.au  Fact Sheet for Healthcare Providers:  IncredibleEmployment.be  This test is no t yet approved or cleared by the [place] FDA and  has been authorized for detection and/or diagnosis of SARS-CoV-2 by FDA under an Emergency Use Authorization (EUA). This EUA will remain  in effect (meaning this test can be used) for the duration of the COVID-19 declaration under Section 564(b)(1) of the Act, 21 U.S.C.section 360bbb-3(b)(1), unless the authorization is terminated  or revoked sooner.       Influenza A by PCR NEGATIVE NEGATIVE   Influenza B by PCR NEGATIVE NEGATIVE    Comment: (NOTE) The Xpert Xpress SARS-CoV-2/FLU/RSV plus assay is intended as an aid in the diagnosis of influenza from Nasopharyngeal swab specimens and should not be used as a sole basis for treatment. Nasal washings and aspirates are unacceptable for Xpert Xpress SARS-CoV-2/FLU/RSV testing.  Fact Sheet for Patients: EntrepreneurPulse.com.au  Fact Sheet for Healthcare Providers: IncredibleEmployment.be  This test is not yet approved or cleared by the [place] FDA and has been authorized for detection and/or diagnosis of SARS-CoV-2 by FDA under an Emergency Use Authorization (EUA). This EUA will remain in effect (meaning this test can be used) for the duration of the COVID-19 declaration under Section 564(b)(1) of the Act, 21 U.S.C. section 360bbb-3(b)(1), unless the authorization is terminated or revoked.  Performed at [facility], [address],  [place], [place] [address]     Chemistries  Recent Labs  Lab 09/06/20 1424 09/06/20 1625  NA 136 137  K 3.4* 3.3*  CL 102 104  CO2 24 24  GLUCOSE 132* 124*  BUN 51* 52*  CREATININE 2.01* 2.05*  CALCIUM 8.6* 8.4*  MG 1.8  --   AST 18 19  ALT 14 13  ALKPHOS 48 46  BILITOT  0.8 0.5   ------------------------------------------------------------------------------------------------------------------  ------------------------------------------------------------------------------------------------------------------ GFR: Estimated Creatinine Clearance: 17.6 mL/min (A) (by C-G formula based on SCr of 2.05 mg/dL (H)). Liver Function Tests: Recent Labs  Lab 09/06/20 1424 09/06/20 1625  AST 18 19  ALT 14 13  ALKPHOS 48 46  BILITOT 0.8 0.5  PROT 6.5 6.3*  ALBUMIN 3.5 3.3*   No results for input(s): LIPASE, AMYLASE in the last 168 hours. Recent Labs  Lab 09/06/20 1625  AMMONIA 11   Coagulation Profile: Recent Labs  Lab 09/06/20 1625  INR 1.2   Cardiac Enzymes: No results for input(s): CKTOTAL, CKMB, CKMBINDEX, TROPONINI in the last 168 hours. BNP (last 3 results) No results for input(s): PROBNP in the last 8760 hours. HbA1C: No results for input(s): HGBA1C in the last 72 hours. CBG: No results for input(s): GLUCAP in the last 168 hours. Lipid Profile: No results for input(s): CHOL, HDL, LDLCALC, TRIG, CHOLHDL, LDLDIRECT in the last 72 hours. Thyroid Function Tests: No results for input(s): TSH, T4TOTAL, FREET4, T3FREE, THYROIDAB in the last 72 hours. Anemia Panel: No results for input(s): VITAMINB12, FOLATE, FERRITIN, TIBC, IRON, RETICCTPCT in the last 72 hours.  --------------------------------------------------------------------------------------------------------------- Urine analysis:    Component Value Date/Time   COLORURINE AMBER (A) 09/06/2020 1726   APPEARANCEUR HAZY (A) 09/06/2020 1726   LABSPEC 1.019 09/06/2020 1726   PHURINE 5.0 09/06/2020 1726    GLUCOSEU NEGATIVE 09/06/2020 1726   HGBUR NEGATIVE 09/06/2020 1726   BILIRUBINUR NEGATIVE 09/06/2020 1726   KETONESUR NEGATIVE 09/06/2020 1726   PROTEINUR 30 (A) 09/06/2020 1726   NITRITE NEGATIVE 09/06/2020 1726   LEUKOCYTESUR NEGATIVE 09/06/2020 1726      Imaging Results:    CT Head Wo Contrast  Result Date: 09/06/2020 CLINICAL DATA:  History of small cell lung cancer. Brain metastases. EXAM: CT HEAD WITHOUT CONTRAST TECHNIQUE: Contiguous axial images were obtained from the base of the skull through the vertex without intravenous contrast. COMPARISON:  October 01, 2017.  June 13, 2020. FINDINGS: Brain: Mild chronic ischemic white matter disease is noted. Metastatic lesion seen adjacent to the atrium of the right lateral ventricle on prior MRI is not well visualized on this unenhanced study. No definite mass effect or midline shift is noted. No hemorrhage or acute infarction is noted. Ventricular size is within normal limits. Vascular: No hyperdense vessel or unexpected calcification. Skull: Normal. Negative for fracture or focal lesion. Sinuses/Orbits: No acute finding. Other: Fluid is noted in the right mastoid air cells. IMPRESSION: Metastatic lesion seen adjacent to the atrium of right lateral ventricle on prior MRI of June 13, 2020 is not well visualized on this unenhanced study. Repeat MRI is recommended for evaluation of this lesion. No other significant intracranial abnormality is noted. Electronically Signed   By: Marijo Conception M.D.   On: 09/06/2020 16:23   DG Chest Port 1 View  Result Date: 09/06/2020 CLINICAL DATA:  Questionable sepsis - evaluate for abnormality Confusion. EXAM: PORTABLE CHEST 1 VIEW COMPARISON:  Radiograph 08/21/2019.  CT 09/15/2019 FINDINGS: Accessed left chest port in place. Postsurgical volume loss in the right hemithorax with right tracheal deviation, unchanged. Upper normal heart size with aortic atherosclerosis and tortuosity. There is scarring in the  periphery of the right mid lung, as well as apical pleuroparenchymal thickening. Patchy airspace opacities at the left lung base are new from prior exam. No pneumothorax or pleural effusion. Bones under mineralized. Surgical and lumbar fusion hardware. IMPRESSION: 1. Patchy airspace opacities at the left lung base, new from prior exam,  suspicious for pneumonia. Recommend radiologic follow-up. 2. Stable postsurgical volume loss and scarring in the right hemithorax. Electronically Signed   By: Keith Rake M.D.   On: 09/06/2020 15:53    My personal review of EKG: Rhythm sinus tach, Rate 111 /min, QTc 487 ,no Acute ST changes   Assessment & Plan:    Principal Problem:   Neutropenic fever (HCC) Active Problems:   Acute renal failure (HCC)   Hypokalemia   Sepsis ([place])   Community acquired pneumonia   Acute metabolic encephalopathy   Anemia associated with chemotherapy   1. Neutropenic fever 1. Fever of 101.1, white blood cell count 0.3 2. White blood cell count 2 hours prior to presentation to ER was 1.0, white blood cell count 8 days ago was 12.1 3. Patient started on broad-spectrum antibiotics, Vanco and Merrem 4. Chest x-ray shows left lung base pneumonia 5. Urinalysis and urine culture pending 6. Blood culture pending 7. Admit to stepdown 2. Sepsis secondary to community-acquired pneumonia 1. Temperature 101.1, tachycardic at 118, tachypneic at 26, hypertensive with a systolic blood pressure of 82, neutropenic with a white blood cell count of 0.3 2. Endorgan damage with an AKI  and the lactic acidosis of 2.5 3. 30 mL/kg bolus in the ED 4. Additional 1 L bolus on admission 5. Continue IV fluid and 150 mL/h 6. Blood cultures, urine culture, urinalysis pending 7. Chest x-ray shows pneumonia as above 8. Started on broad-spectrum antibiotics with vancomycin and Merrem 9. Sepsis order set utilized 10. Monitor on stepdown 3. Community-acquired pneumonia 1. See plan above 4. Acute  metabolic encephalopathy 1. CT head recommends repeat MRI as previously seen metastatic lesion adjacent to the atrium of the right lateral ventricle is not well visualized 2. MRI in the a.m. 3. Metabolic encephalopathy likely might be factorial -brain mets resulting in occasional confusion at baseline, polypharmacy with multiple pain medications, infection, hypotension 4. Continue to treat infection, use pain meds only as needed 5. AKI 1. Secondary to hypovolemia/prerenal 2. Boluses as above continue IV hydration 3. Recheck in a.m. 6. Hypokalemia 1. IV potassium 2. Recheck in a.m. 7. Anemia 1. Hemoglobin dropped from 8.9 8 days ago to 7.3 2. Likely secondary to chemotherapy 3. Daughter reports no overt signs of bleeding 4. FOBT pending 5. Repeat H&H at 2 AM and trend CBC in the a.m. 8.    DVT Prophylaxis-   Heparin- SCDs   AM Labs Ordered, also please review Full Orders  Family Communication: Admission, patients condition and plan of care including tests being ordered have been discussed with the patient and daughter who indicate understanding and agree with the plan and Code Status.  Code Status: Full -will likely need palliative consult  Admission status: Inpatient :The appropriate admission status for this patient is INPATIENT. Inpatient status is judged to be reasonable and necessary in order to provide the required intensity of service to ensure the patient's safety. The patient's presenting symptoms, physical exam findings, and initial radiographic and laboratory data in the context of their chronic comorbidities is felt to place them at high risk for further clinical deterioration. Furthermore, it is not anticipated that the patient will be medically stable for discharge from the hospital within 2 midnights of admission. The following factors support the admission status of inpatient.     The patient's presenting symptoms include altered mental status. The worrisome physical  exam findings include fever, tachycardia, tachypnea, hypotension The initial radiographic and laboratory data are worrisome because of possible pneumonia in  the left lung base The chronic co-morbidities include CHF, GERD, non-small cell lung cancer, hypertension, hyperlipidemia       * I certify that at the point of admission it is my clinical judgment that the patient will require inpatient hospital care spanning beyond 2 midnights from the point of admission due to high intensity of service, high risk for further deterioration and high frequency of surveillance required.*  Time spent in minutes : [place]

## 2020-09-06 NOTE — Progress Notes (Signed)
To treatment room for hydration.  Patient presented confused, SOB, and agitated.  Family at side.  Port accessed with labs drawn from port.  Dr. Delton Coombes in room for assessment with verbal order to go to the emergency room.  Report called and taken to room 6 per charge nurse.

## 2020-09-06 NOTE — ED Notes (Signed)
Attempted in and out on pt no urine return.

## 2020-09-06 NOTE — Telephone Encounter (Signed)
Do you happen to have Stacey's call back number?

## 2020-09-06 NOTE — Progress Notes (Signed)
Pharmacy Antibiotic Note  Ashley [name] is a 70 y.o. female admitted on 09/06/2020 with febrile neutropenia.  Pharmacy has been consulted for Vancomycin and merrem dosing.  Plan: Vancomycin 1gm IV loading dose, then 500mg  IV q36h for AUC 540 Merrem 1gm IV q12h F/U cxs and clinical progress Monitor V/S, labs and levels as indicated  Height: 5\' 2"  (157.5 cm) Weight: 44.9 kg (99 lb) IBW/kg (Calculated) : 50.1  Temp (24hrs), Avg:99.1 F (37.3 C), Min:97.1 F (36.2 C), Max:101.1 F (38.4 C)  Recent Labs  Lab 09/06/20 1424  WBC 1.0*  CREATININE 2.01*    Estimated Creatinine Clearance: 17.9 mL/min (A) (by C-G formula based on SCr of 2.01 mg/dL (H)).    Allergies  Allergen Reactions  . Penicillins Hives and Rash    Did it involve swelling of the face/tongue/throat, SOB, or low BP? No Did it involve sudden or severe rash/hives, skin peeling, or any reaction on the inside of your mouth or nose? No Did you need to seek medical attention at a hospital or doctor's office? No When did it last happen?5-10 year If all above answers are "NO", may proceed with cephalosporin use.      Antimicrobials this admission: Vancomycin 5/24 >> Merrem 5/24 >>   Dose adjustments this admission: prn  Microbiology results: 5/24  BCx: pending 5/24 UCx: pending  MRSA PCR:   Thank you for allowing pharmacy to be a part of this patient's care.  Isac Sarna, BS [place], [place] Clinical Pharmacist Pager [phone] 09/06/2020 4:11 PM

## 2020-09-07 ENCOUNTER — Inpatient Hospital Stay (HOSPITAL_COMMUNITY): Payer: Medicare Other

## 2020-09-07 ENCOUNTER — Encounter (HOSPITAL_COMMUNITY): Payer: Self-pay | Admitting: Family Medicine

## 2020-09-07 DIAGNOSIS — Z7189 Other specified counseling: Secondary | ICD-10-CM | POA: Diagnosis not present

## 2020-09-07 DIAGNOSIS — D709 Neutropenia, unspecified: Secondary | ICD-10-CM | POA: Diagnosis not present

## 2020-09-07 DIAGNOSIS — Z515 Encounter for palliative care: Secondary | ICD-10-CM

## 2020-09-07 DIAGNOSIS — T451X5A Adverse effect of antineoplastic and immunosuppressive drugs, initial encounter: Secondary | ICD-10-CM

## 2020-09-07 DIAGNOSIS — D6481 Anemia due to antineoplastic chemotherapy: Secondary | ICD-10-CM | POA: Diagnosis not present

## 2020-09-07 DIAGNOSIS — R5081 Fever presenting with conditions classified elsewhere: Secondary | ICD-10-CM | POA: Diagnosis not present

## 2020-09-07 LAB — CBC WITH DIFFERENTIAL/PLATELET
Abs Immature Granulocytes: 0.03 10*3/uL (ref 0.00–0.07)
Basophils Absolute: 0 10*3/uL (ref 0.0–0.1)
Basophils Absolute: 0 10*3/uL (ref 0.0–0.1)
Basophils Relative: 0 %
Basophils Relative: 1 %
Eosinophils Absolute: 0 10*3/uL (ref 0.0–0.5)
Eosinophils Absolute: 0 10*3/uL (ref 0.0–0.5)
Eosinophils Relative: 0 %
Eosinophils Relative: 0 %
HCT: 18.1 % — ABNORMAL LOW (ref 36.0–46.0)
HCT: 21.4 % — ABNORMAL LOW (ref 36.0–46.0)
Hemoglobin: 5.7 g/dL — CL (ref 12.0–15.0)
Hemoglobin: 6.7 g/dL — CL (ref 12.0–15.0)
Immature Granulocytes: 3 %
Lymphocytes Relative: 41 %
Lymphocytes Relative: 39 %
Lymphs Abs: 0.2 10*3/uL — ABNORMAL LOW (ref 0.7–4.0)
Lymphs Abs: 0.4 10*3/uL — ABNORMAL LOW (ref 0.7–4.0)
MCH: 31.1 pg (ref 26.0–34.0)
MCH: 31.3 pg (ref 26.0–34.0)
MCHC: 31.5 g/dL (ref 30.0–36.0)
MCHC: 31.3 g/dL (ref 30.0–36.0)
MCV: 98.9 fL (ref 80.0–100.0)
MCV: 100 fL (ref 80.0–100.0)
Monocytes Absolute: 0 10*3/uL — ABNORMAL LOW (ref 0.1–1.0)
Monocytes Absolute: 0.1 10*3/uL (ref 0.1–1.0)
Monocytes Relative: 4 %
Monocytes Relative: 5 %
Neutro Abs: 0.3 10*3/uL — CL (ref 1.7–7.7)
Neutro Abs: 0.5 10*3/uL — ABNORMAL LOW (ref 1.7–7.7)
Neutrophils Relative %: 55 %
Neutrophils Relative %: 52 %
Platelets: 186 10*3/uL (ref 150–400)
Platelets: 239 10*3/uL (ref 150–400)
RBC: 1.83 MIL/uL — ABNORMAL LOW (ref 3.87–5.11)
RBC: 2.14 MIL/uL — ABNORMAL LOW (ref 3.87–5.11)
RDW: 20.9 % — ABNORMAL HIGH (ref 11.5–15.5)
RDW: 21.1 % — ABNORMAL HIGH (ref 11.5–15.5)
WBC: 0.5 10*3/uL — CL (ref 4.0–10.5)
WBC: 1 10*3/uL — CL (ref 4.0–10.5)
nRBC: 0 % (ref 0.0–0.2)
nRBC: 0 /100 WBC
nRBC: 0 % (ref 0.0–0.2)

## 2020-09-07 LAB — PROTIME-INR
INR: 1.4 — ABNORMAL HIGH (ref 0.8–1.2)
INR: 1.3 — ABNORMAL HIGH (ref 0.8–1.2)
Prothrombin Time: 16.9 seconds — ABNORMAL HIGH (ref 11.4–15.2)
Prothrombin Time: 16.3 seconds — ABNORMAL HIGH (ref 11.4–15.2)

## 2020-09-07 LAB — HEMOGLOBIN AND HEMATOCRIT, BLOOD
HCT: 18.5 % — ABNORMAL LOW (ref 36.0–46.0)
HCT: 28.8 % — ABNORMAL LOW (ref 36.0–46.0)
Hemoglobin: 5.8 g/dL — CL (ref 12.0–15.0)
Hemoglobin: 9.6 g/dL — ABNORMAL LOW (ref 12.0–15.0)

## 2020-09-07 LAB — MAGNESIUM: Magnesium: 1.6 mg/dL — ABNORMAL LOW (ref 1.7–2.4)

## 2020-09-07 LAB — COMPREHENSIVE METABOLIC PANEL
ALT: 11 U/L (ref 0–44)
AST: 12 U/L — ABNORMAL LOW (ref 15–41)
Albumin: 2.5 g/dL — ABNORMAL LOW (ref 3.5–5.0)
Alkaline Phosphatase: 37 U/L — ABNORMAL LOW (ref 38–126)
Anion gap: 5 (ref 5–15)
BUN: 38 mg/dL — ABNORMAL HIGH (ref 8–23)
CO2: 23 mmol/L (ref 22–32)
Calcium: 7.6 mg/dL — ABNORMAL LOW (ref 8.9–10.3)
Chloride: 108 mmol/L (ref 98–111)
Creatinine, Ser: 1.21 mg/dL — ABNORMAL HIGH (ref 0.44–1.00)
GFR, Estimated: 48 mL/min — ABNORMAL LOW (ref >60)
Glucose, Bld: 115 mg/dL — ABNORMAL HIGH (ref 70–99)
Potassium: 3.8 mmol/L (ref 3.5–5.1)
Sodium: 136 mmol/L (ref 135–145)
Total Bilirubin: 0.5 mg/dL (ref 0.3–1.2)
Total Protein: 5 g/dL — ABNORMAL LOW (ref 6.5–8.1)

## 2020-09-07 LAB — CORTISOL-AM, BLOOD: Cortisol - AM: 34.7 ug/dL — ABNORMAL HIGH (ref 6.7–22.6)

## 2020-09-07 LAB — LACTIC ACID, PLASMA
Lactic Acid, Venous: 1.5 mmol/L (ref 0.5–1.9)
Lactic Acid, Venous: 1.8 mmol/L (ref 0.5–1.9)

## 2020-09-07 LAB — APTT: aPTT: 35 seconds (ref 24–36)

## 2020-09-07 LAB — PREPARE RBC (CROSSMATCH)

## 2020-09-07 LAB — OCCULT BLOOD X 1 CARD TO LAB, STOOL: Fecal Occult Bld: NEGATIVE

## 2020-09-07 LAB — PROCALCITONIN: Procalcitonin: 2.54 ng/mL

## 2020-09-07 MED ORDER — SODIUM CHLORIDE 0.9% IV SOLUTION: Freq: Once | INTRAVENOUS | Status: DC

## 2020-09-07 MED ORDER — GABAPENTIN 300 MG PO CAPS
600.0000 mg | ORAL_CAPSULE | Freq: Every day | ORAL | Status: DC
  Administered 2020-09-07 – 2020-09-09 (×3): 600 mg via ORAL
  Filled 2020-09-07 (×3): qty 2

## 2020-09-07 MED ORDER — VANCOMYCIN HCL 750 MG/150ML IV SOLN
750.0000 mg | INTRAVENOUS | Status: DC
  Administered 2020-09-08: 750 mg via INTRAVENOUS
  Filled 2020-09-07: qty 150

## 2020-09-07 MED ORDER — CHLORHEXIDINE GLUCONATE CLOTH 2 % EX PADS
6.0000 | MEDICATED_PAD | Freq: Every day | CUTANEOUS | Status: DC
  Administered 2020-09-07 – 2020-09-09 (×3): 6 via TOPICAL

## 2020-09-07 MED ORDER — ALBUTEROL SULFATE (2.5 MG/3ML) 0.083% IN NEBU
2.5000 mg | INHALATION_SOLUTION | Freq: Three times a day (TID) | RESPIRATORY_TRACT | Status: DC
  Administered 2020-09-07 – 2020-09-09 (×6): 2.5 mg via RESPIRATORY_TRACT
  Filled 2020-09-07 (×8): qty 3

## 2020-09-07 MED ORDER — LACTATED RINGERS IV BOLUS (SEPSIS)
1000.0000 mL | Freq: Once | INTRAVENOUS | Status: AC
  Administered 2020-09-07: 1000 mL via INTRAVENOUS

## 2020-09-07 MED ORDER — LACTATED RINGERS IV BOLUS (SEPSIS)
500.0000 mL | Freq: Once | INTRAVENOUS | Status: AC
  Administered 2020-09-07: 500 mL via INTRAVENOUS

## 2020-09-07 NOTE — ED Notes (Signed)
Dr. Josephine Cables was paged and informed that daughter wanted to be made aware of patient's plan of care. Daughter also stated that she didn't feel that patient needed 2 units of blood, because, "After her chemo, her levels will drop and then after a few days, her levels will normalize again. I don't feel that she needs 2 units of blood." Provider informed that patient's family would like to speak with him.

## 2020-09-07 NOTE — ED Notes (Signed)
Pt to MRI

## 2020-09-07 NOTE — ED Notes (Addendum)
This RN came into patient room and patient's daughter was giving her an orange color beverage and stated that she was going to give her pain and nausea medications. I explained to daughter that at this time patient is NPO per MD and that patient was not to take medications from home, daughter expressed concerns over patient being in pain and nauseated, I let the patient's daughter know that I would inform the provider, however the patient's BP is very low and it may not be appropriate at this time to give narcotics due to the medications effect on further lowering BP and patient's altered mental status, daughter expressed frustration, I let her know that I would inform MD, I sent Dr. [name] a chat message of daughter's concern over pain and nausea medications, tylenol ordered at this time, will continue to monitor.

## 2020-09-07 NOTE — Progress Notes (Signed)
Patient was admitted earlier in the shift yesterday (5/24) due to neutropenic fever and sepsis secondary to CAP.  Hemoglobin on admission was 7.3, this was repeated and it has dropped to 5.8. FOBT was negative Daughter at bedside states that patient's hemoglobin usually drop after chemotherapy and that it usually bounces back.  She does not want any blood transfusion at this time.

## 2020-09-07 NOTE — ED Notes (Signed)
Per Dr. Josephine Cables, blood transfusion is being cancelled at this time.

## 2020-09-07 NOTE — ED Notes (Signed)
Critical Result: WBC 0.5 HGB 5.7 Reported to Dr. Roger Shelter.

## 2020-09-07 NOTE — Consult Note (Signed)
Consultation Note Date: 09/07/2020   Patient Name: Ashley [name]  DOB: 1950-12-22  MRN: [id number]  Age / Sex: 70 y.o., female  PCP: [name], NP Referring Physician: Deatra James, MD  Reason for Consultation: Establishing goals of care and Psychosocial/spiritual support  HPI/Patient Profile: 70 y.o. 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  [name] had chemotherapy last week, receives chemo every 3 weeks, is seen by Dr. Raliegh Ip at [facility] cancer center.  Clinical Assessment and Goals of Care: I have reviewed medical records including EPIC notes, labs and imaging, received report from RN, assessed the patient.  Ashley [name], Ashley [name], is lying quietly on the stretcher in the ED.  She greets me making and keeping eye contact.  She appears acutely/chronically ill and quite frail.  She is alert and oriented, able to make her basic needs known.  Her daughter, [name], has stepped away for something to eat. I introduced Palliative Medicine as specialized medical care for people living with serious illness. It focuses on providing relief from the symptoms and stress of a serious illness. The goal is to improve quality of life for both the patient and the family.  We talked about her acute health concerns, pneumonia, low hemoglobin, and the treatment plan.  I encouraged her to rest and let the medicine help her.  She denies questions or concerns at this time.  We talked about time for outcomes.  I ask about pain and Ashley [name] tells me that her pain is  "better".  She takes oxycodone IR 5 mg by mouth 3 times a day for her home regimen.  She has oxycodone IR 5 mg every 4 hours as needed ordered.  I encouraged her, "ask and you shall receive".  I share that I will follow-up with her when she is transferred to the floor. We discussed the importance of continued conversation with family and the medical providers regarding overall plan of care and treatment options, ensuring decisions are within the context of the patient's values and GOCs.    Questions and concerns were addressed.  Patient was encouraged to reach out with questions or concerns.  PMT will continue to support holistically.  Conference with attending, bedside nursing staff related to patient condition, needs, goals of care.   HCPOA   NEXT OF KIN - Ashley [name] tells me that she has 2 daughters, [name] and Amy who share responsibility for decision-making if Ashley [name] is unable.    SUMMARY OF RECOMMENDATIONS   Continue to treat the treatable Time for outcomes At this point, continuing with chemotherapy as offered PMT to continue goals of care discussion/CODE STATUS discussion when family present   Code Status/Advance Care Planning:  Full code -not discussed with patient as  she is alone.  Symptom Management:   Per hospitalist, no additional needs at this time.  Palliative Prophylaxis:   Frequent Pain Assessment  Additional Recommendations (Limitations, Scope, Preferences):  Full Scope Treatment  Psycho-social/Spiritual:   Desire for further Chaplaincy support:no  Additional Recommendations: Caregiving  Support/Resources and Education on Hospice  Prognosis:   Unable to determine, based on outcomes.  Guarded at this point  Discharge Planning: To be determined, based on outcomes.      Primary Diagnoses: Present on Admission: . Neutropenic fever ([place]) . Acute renal failure ([place]) . Sepsis ([place]) . Community acquired pneumonia . Acute metabolic encephalopathy .  Hypokalemia . Anemia associated with chemotherapy   I have reviewed the medical record, interviewed the patient and family, and examined the patient. The following aspects are pertinent.  Past Medical History:  Diagnosis Date  . Adenocarcinoma of left breast ([place]) 01/09/2016  . Anginal pain ([place])   . Arthritis   . Ascending aortic aneurysm ([place])   . Cancer [facility]) 1995   breast, left, mastectomy/chemo  . Chest pain    Possibly cardiac. No evidence of ischemia/injury based upon normal troponin I. Chest discomfort could be tachycardia induced supply demand mismatch.   . CHF (congestive heart failure) ([place]) 11/17/2015   after surgery   . Colon adenomas   . Coronary artery disease   . DJD (degenerative joint disease)   . Dyspnea    with exertion  . Emphysema of lung ([place]) 11/17/2015  . Essential hypertension, benign   . GERD (gastroesophageal reflux disease)   . History of hiatal hernia   . Hyperlipidemia   . Hypertension   . Melanoma ([place]) 01/09/2016  . Osteopenia   . Palpitations   . Pernicious anemia 03/06/2016  . Pernicious anemia   . Pure hypercholesterolemia   . Raynaud's disease   . 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 Thrombocytosis    Idiopathic  . Vitamin D deficiency    Social History   Socioeconomic History  . Marital status: Divorced    Spouse name: Not on file  . Number of children: 2  . Years of education: Not on file  . Highest education level: Not on file  Occupational History  . Not on file  Tobacco Use  . Smoking status: Former Smoker    Packs/day: 0.50    Years: 50.00    Pack years: 25.00    Types: Cigarettes    Quit date: 11/25/2018    Years since quitting: 1.7  . Smokeless tobacco: Never Used  Vaping Use  . Vaping Use: Never used  Substance and Sexual Activity  . Alcohol use: Yes    Comment: occasional  . Drug use: No  . Sexual activity: Not Currently    Birth control/protection: Post-menopausal  Other Topics Concern  . Not on  file  Social History Narrative  . Not on file   Social Determinants of Health   Financial Resource Strain: Low Risk   . Difficulty of Paying Living Expenses: Not hard at all  Food Insecurity: No Food Insecurity  . Worried About Charity fundraiser in the Last Year: Never true  . Ran Out of Food in the Last Year: Never true  Transportation Needs: No Transportation Needs  . Lack of Transportation (Medical): No  . Lack of Transportation (Non-Medical): No  Physical Activity: Insufficiently Active  . Days of Exercise per Week: 4 days  . Minutes of Exercise per Session: 30 min  Stress: No Stress Concern Present  .  Feeling of Stress : Not at all  Social Connections: Moderately Isolated  . Frequency of Communication with Friends and Family: More than three times a week  . Frequency of Social Gatherings with Friends and Family: More than three times a week  . Attends Religious Services: More than 4 times per year  . Active Member of Clubs or Organizations: No  . Attends Archivist Meetings: Never  . Marital Status: Divorced   Family History  Problem Relation Age of Onset  . Hypertension Mother   . Heart failure Mother   . Congestive Heart Failure Mother   . COPD Mother   . Pernicious anemia Mother   . Cancer Mother        lung  . Hypertension Father   . CAD Father   . Heart attack Father   . Hypertension Sister   . Cancer Other   . Celiac disease Other    Scheduled Meds: . sodium chloride   Intravenous Once  . albuterol  2.5 mg Nebulization TID  . aspirin EC  81 mg Oral Daily  . gabapentin  600 mg Oral Daily  . heparin  5,000 Units Subcutaneous Q8H  . hydrocortisone sod succinate (SOLU-CORTEF) inj  50 mg Intravenous Q6H  . mirtazapine  15 mg Oral QHS  . rosuvastatin  40 mg Oral QHS  . sucralfate  1 g Oral TID   Continuous Infusions: . meropenem (MERREM) IV    . [START ON 09/08/2020] vancomycin     PRN Meds:.acetaminophen **OR** acetaminophen, ALPRAZolam,  guaiFENesin, ipratropium-albuterol, meclizine, methocarbamol, oxyCODONE, polyethylene glycol, traMADol Medications Prior to Admission:  Prior to Admission medications   Medication Sig Start Date End Date Taking? Authorizing Provider  albuterol (PROVENTIL HFA;VENTOLIN HFA) 108 (90 Base) MCG/ACT inhaler Inhale 2 puffs into the lungs every 6 (six) hours as needed for wheezing or shortness of breath. 05/01/17  Yes Mannam, Praveen, MD  albuterol (PROVENTIL) (2.5 MG/3ML) 0.083% nebulizer solution Take 2.5 mg by nebulization every 6 (six) hours as needed for wheezing or shortness of breath. 12/01/19  Yes [provider]  ALPRAZolam Duanne Moron) 0.5 MG tablet Take one tablet prior to MRIs and/or radiation oncology procedures. 06/03/20  Yes Eppie Gibson, MD  Calcium Carb-Cholecalciferol 600-800 MG-UNIT TABS Take 1 tablet by mouth 2 (two) times daily.   Yes [provider]  Cholecalciferol (VITAMIN D-3) 1000 units CAPS Take 1,000 Units daily by mouth.    Yes [provider]  clonazePAM (KLONOPIN) 0.5 MG tablet Take 1 tablet (0.5 mg total) by mouth at bedtime. Take 1 and half tablets by mouth at bedtime. 08/08/20  Yes Derek Jack, MD  cyanocobalamin (,VITAMIN B-12,) 1000 MCG/ML injection INJECT 1 ML INTO THE MUSCLE ONCE MONTHLY AS DIRECTED. Patient taking differently: Inject 1,000 mcg into the muscle every 30 (thirty) days. 09/26/16  Yes Kefalas, Manon Hilding, PA-C  dexamethasone (DECADRON) 2 MG tablet Take 1 tablet (2 mg total) by mouth daily. Patient taking differently: Take 2 mg by mouth every other day. 07/18/20  Yes Derek Jack, MD  diclofenac Sodium (VOLTAREN) 1 % GEL Apply 2 g topically 4 (four) times daily as needed (pain). 02/24/18  Yes [provider]  gabapentin (NEURONTIN) 600 MG tablet Take 1 tablet (600 mg total) by mouth daily. 09/21/19  Yes Derek Jack, MD  guaiFENesin (MUCINEX) 600 MG 12 hr tablet Take 1 tablet (600 mg total) by mouth 2 (two) times  daily as needed for cough or to loosen phlegm. Patient taking differently: Take 600  mg by mouth 2 (two) times daily. 01/07/19  Yes Lars Pinks M, PA-C  ibandronate (BONIVA) 150 MG tablet Take 150 mg by mouth every 30 (thirty) days.  07/14/12  Yes [provider]  ibuprofen (ADVIL) 800 MG tablet Take 1 tablet (800 mg total) by mouth every 8 (eight) hours as needed for moderate pain. 05/05/20  Yes Derek Jack, MD  ipratropium (ATROVENT) 0.02 % nebulizer solution Take 0.5 mg by nebulization every 6 (six) hours as needed for wheezing or shortness of breath. 11/24/19  Yes [provider]  ipratropium-albuterol (DUONEB) 0.5-2.5 (3) MG/3ML SOLN Take 3 mLs by nebulization every 6 (six) hours as needed (shortness of breath). 11/21/15  Yes Arrien, Jimmy Picket, MD  lidocaine-prilocaine (EMLA) cream Apply a small amount to port a cath site and cover with plastic wrap 1 hour prior to chemotherapy appointments 08/08/20  Yes Derek Jack, MD  magic mouthwash w/lidocaine SOLN 10 cc swab and swallow Patient taking differently: Take 10 mLs by mouth daily as needed for mouth pain. swab and swallow 07/20/20  Yes Derek Jack, MD  methocarbamol (ROBAXIN) 500 MG tablet Take 500 mg by mouth daily.   Yes [provider]  mirtazapine (REMERON) 15 MG tablet TAKE (1) TABLET BY MOUTH AT BEDTIME. Patient taking differently: Take 15 mg by mouth at bedtime. 09/02/20  Yes Derek Jack, MD  nitroGLYCERIN (NITRODUR - DOSED IN MG/24 HR) 0.1 mg/hr patch Place 0.1 mg onto the skin daily. 06/23/19  Yes [provider]  ondansetron (ZOFRAN ODT) 8 MG disintegrating tablet Take 1 tablet (8 mg total) by mouth every 8 (eight) hours as needed for nausea or vomiting. Patient taking differently: Take 8 mg by mouth daily. In Day 07/18/20  Yes Derek Jack, MD  oxyCODONE (OXY IR/ROXICODONE) 5 MG immediate release tablet Take 1 tablet (5 mg total) by mouth every 8 (eight)  hours. 08/08/20  Yes Derek Jack, MD  OXYGEN Inhale 3 L into the lungs daily as needed (SOB).   Yes [provider]  pantoprazole (PROTONIX) 40 MG tablet TAKE ONE TABLET BY MOUTH TWICE DAILY. Patient taking differently: Take 40 mg by mouth 2 (two) times daily. 01/01/20  Yes Lockamy, Randi L, NP-C  polyethylene glycol (MIRALAX / GLYCOLAX) 17 g packet Take 17 g by mouth daily as needed for mild constipation.   Yes [provider]  potassium chloride SA (KLOR-CON) 20 MEQ tablet Take 1 tablet (20 mEq total) by mouth 3 (three) times daily. 08/08/20  Yes Derek Jack, MD  prochlorperazine (COMPAZINE) 10 MG tablet 1 tablet as needed Patient taking differently: Take 10 mg by mouth daily. At night 09/02/20  Yes Derek Jack, MD  rizatriptan (MAXALT) 10 MG tablet Take 10 mg by mouth 2 (two) times daily. 1-2 times daily May repeat in 2 hours if needed   Yes [provider]  rosuvastatin (CRESTOR) 40 MG tablet Take 40 mg by mouth at bedtime.   Yes [provider]  sucralfate (CARAFATE) 1 g tablet Take 1 g by mouth 3 (three) times daily as needed (upset stomach). 04/14/20  Yes [provider]  traMADol (ULTRAM) 50 MG tablet Take 50 mg by mouth daily. 04/21/19  Yes [provider]  aspirin EC 81 MG tablet Take 81 mg by mouth daily.    [provider]   Allergies  Allergen Reactions  . Penicillins Hives and Rash    Did it involve swelling of the face/tongue/throat, SOB, or low BP? No Did it involve sudden or  severe rash/hives, skin peeling, or any reaction on the inside of your mouth or nose? No Did you need to seek medical attention at a hospital or doctor's office? No When did it last happen?5-10 year If all above answers are "NO", may proceed with cephalosporin use.     Review of Systems  Unable to perform ROS: Other    Physical Exam Vitals and nursing note reviewed.  Constitutional:      General: She is not  in acute distress.    Appearance: She is ill-appearing.  HENT:     Mouth/Throat:     Mouth: Mucous membranes are dry.  Cardiovascular:     Rate and Rhythm: Normal rate.  Pulmonary:     Effort: Pulmonary effort is normal. No respiratory distress.  Abdominal:     General: Abdomen is flat.  Skin:    General: Skin is warm and dry.     Coloration: Skin is pale.  Neurological:     Mental Status: She is alert and oriented to person, place, and time.  Psychiatric:        Behavior: Behavior normal.     Vital Signs: BP 123/82   Pulse 62   Temp 97.9 F (36.6 C) (Oral)   Resp 18   Ht 5\' 2"  (1.575 m)   Wt 44.9 kg   SpO2 100%   BMI 18.11 kg/m      Pain Score: 0-No pain   SpO2: SpO2: 100 % O2 Device:SpO2: 100 % O2 Flow Rate: .O2 Flow Rate (L/min): 3 L/min  IO: Intake/output summary:   Intake/Output Summary (Last 24 hours) at 09/07/2020 1415 Last data filed at 09/07/2020 1223 Gross per 24 hour  Intake 3542 ml  Output --  Net 3542 ml    LBM:   Baseline Weight: Weight: 44.9 kg Most recent weight: Weight: 44.9 kg     Palliative Assessment/Data:   Flowsheet Rows   Flowsheet Row Most Recent Value  Intake Tab   Referral Department Hospitalist  Unit at Time of Referral Cardiac/Telemetry Unit  Palliative Care Primary Diagnosis Cancer  Date Notified 09/07/20  Palliative Care Type New Palliative care  Reason for referral Clarify Goals of Care  Date of Admission 09/06/20  Date first seen by Palliative Care 09/07/20  # of days Palliative referral response time 0 Day(s)  # of days IP prior to Palliative referral 1  Clinical Assessment   Palliative Performance Scale Score 40%  Pain Max last 24 hours Not able to report  Pain Min Last 24 hours Not able to report  Dyspnea Max Last 24 Hours Not able to report  Dyspnea Min Last 24 hours Not able to report  Psychosocial & Spiritual Assessment   Palliative Care Outcomes       Time In: 1330  Time Out: 1420 Time Total: 50  minutes  Greater than 50%  of this time was spent counseling and coordinating care related to the above assessment and plan.  Signed by: Drue Novel, NP   Please contact Palliative Medicine Team phone at [phone] for questions and concerns.  For individual provider: See Shea Evans

## 2020-09-07 NOTE — ED Notes (Signed)
Pt wheeled to bathroom. Pt able to walk from wheelchair with standby assist. Pt appears to be in NAD.

## 2020-09-07 NOTE — Progress Notes (Signed)
PROGRESS NOTE    Patient: Ashley [name]                            PCP: [name], NP                    DOB: 1950/10/08            DOA: 09/06/2020 [id number]:[id number]             DOS: 09/07/2020, 7:01 AM   LOS: 1 day   Date of Service: The patient was seen and examined on 09/07/2020  Subjective:   The patient was seen and examined this morning. T-max 101.1, this morning 97.9, respiratory rate still at 23, blood pressure stable at 124/97 Satting 97% on room air Remain confused, pleasant Otherwise no issues overnight .  Brief Narrative:   Ashley [name]  is a 70 y.o. [name], with history of vitamin D deficiency, non-small cell lung cancer on chemotherapy, hypertension, hyperlipidemia, GERD, degenerative joint disease, coronary artery disease, CHF, and more presents the ED with a chief complaint of altered mental status.    Daughter at bedside reports that patient has been overly fatigued and confused at home.  Daughter reports that patient did have chemotherapy last week, she gets chemotherapy every 3 weeks.  The week after her chemo treatment, it is normal for her to be fatigued.  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  Daughter feels like her confusion has been worse and her fatigue has been worse than what is her normal.  She reports that patient is normally able to recognize family, but sometimes has trouble with word finding and memory.    Daughter called Dr. Raliegh Ip who recommended going to the ER per daughter report.  Daughter does report that she has had episodes of confusion like this in the past when she has had an infection.   She also reports that the patient has not been eating or drinking normally.    When she is on her Remeron, her appetite seems much improved.  Daughter reports that at least 3 days or patient can even take 1 bite of food.    Oxygen saturations are reading low in the room, daughter reports that patient has  Raynaud's and O2 sats are usually not accurate with pulse ox.  Daughter reports that patient has been dehydrated due to the poor p.o. intake, and received fluid boluses last Thursday and Friday with her chemo.  As far as daughter knows patient has not complained of any pains or symptoms that are a change from her normal.  Patient is a former smoker, does not drink alcohol, does not use illicit drugs.  Patient is vaccinated for COVID.   Full CODE  In the ED T max 101.1, HR 90-118, RR 12-26, BP trending down at admission, currently 82/59, satting at 95% Leukopenia with a white blood cell count of 0.3, hemoglobin 7.3, hematocrit 23.4, but platelets within normal at 320 Hypokalemia at 3.3, and an AKI with a creatinine of 2.01.  8 days ago creatinine was 1.19 CT head recommends MRI to better characterize metastatic lesion that was seen previously, but not well visualized on this image Chest x-ray shows left lung base opacities EKG shows sinus tachycardia 111, QTc 487 1.3 L bolus in ED, 1 L bolus at admission, continue LR at 150 MLS per hour Tylenol given for fever Pharmacy  consulted for dosing of vancomycin and Merrem UA and UC pending Blood culture pending Admission requested for further evaluation and treatment of neutropenic fever, SEPSIS      Assessment & Plan:   Principal Problem:   Neutropenic fever ([place]) Active Problems:   Acute renal failure ([facility])   Hypokalemia   Sepsis ([place])   Community acquired pneumonia   Acute metabolic encephalopathy   Anemia associated with chemotherapy     1. Sepsis - POA with - Neutropenic fever 1. Tmax 101.1, white blood cell count 0.3, remains tachypneic with a respiratory rate of 23, tachycardia has improved..  From 118-76 now, temp 97.9 this morning 2. White blood cell count 2 hours prior to presentation to ER was 1.0, white blood cell count 8 days ago was 12.1 3. Chest x-ray shows left lung base pneumonia 4. Continue broad-spectrum  antibiotics vancomycin, meropenem 5. Blood culture >> 6. Urine culture>>> 7. We will continue to monitor the patient closely on telemetry  2. Sepsis secondary to community-acquired pneumonia 1. Temp improved to 97.9, remained tachypneic with respiratory rate of 23, tachycardia improved from 118-76 now, blood pressure stable at 124/97 2. Lactic acidosis, 2.5, 2.7, 2.7 3. WBC count 0.5, 1.0, 4. Endorgan damage with an AKI  and the lactic acidosis of 2.5,  5. Status post IV fluid resuscitation aggressive per sepsis protocol, continue maintenance fluid  6. Blood cultures, urine culture >>>>  7. Chest x-ray shows pneumonia as above 8. Started on broad-spectrum antibiotics with vancomycin and Merrem 9. Management per sepsis physiology  3. Community-acquired pneumonia - See plan above  4. Acute metabolic encephalopathy 1. Multifactorial likely due to acute infection, sepsis 2. CT head recommends repeat MRI as previously seen metastatic lesion adjacent to the atrium of the right lateral ventricle is not well visualized 3. MRA head -inconclusive 4. Metabolic encephalopathy likely might be factorial -brain mets resulting in occasional confusion at baseline, polypharmacy with multiple pain medications, infection, hypotension 5. Continue to treat infection, use pain meds only as needed 5. AKI 1. Secondary to hypovolemia/prerenal 2. Boluses as above continue IV hydration 3. Creatinine 2.01, 2.05, 1.21 today  6. Hypokalemia 1. IV potassium 2. Potassium 3.4, 3.3, 3.8 today 7. Acute on chronic anemia 1. Hemoglobin dropped  8.0, 7.3 >>> 5.7 2. Likely secondary to chemotherapy 3. FOBT negative 4. Symptomatic anemia, hemoglobin 5.8, 5.7, 6.7.Marland KitchenMarland Kitchen Per patient and her daughter patient's become anemic postchemotherapy now that she is symptomatic with tachypnea, tachycardia, on BP admission 75/54 S/p IVF resuscitation.... We will transfuse with 2 units of PRBC 5. Pros and cons of blood transfusion was  discussed with the patient and her daughter--consent to proceed with transfusion was given.  ----------------------------------------------------------------------------------------------------------------------------------------------- Nutritional status:  The patient's BMI is: Body mass index is 18.11 kg/m. I agree with the assessment and plan as outlined    --------------------------------------------------------------------------------------------------------------------------------------- Cultures; Blood Cultures x 2 >> NGT Urine Culture  >>> NGT  Sputum Culture >> NGT   Antimicrobials: 09/06/2020 meropenem/vancomycin >>    Consultants: Palliative, oncology   ---------------------------------------------------------------------------------------------------------------------------------------  DVT prophylaxis:  SCD/Compression stockings Code Status:   Code Status: Full Code  Family Communication:   Attempt will be made to update daily The above findings and plan of care has been discussed with patient (and family)  in detail,  they expressed understanding and agreement of above. -Advance care planning has been discussed.   Admission status:   Status is: Inpatient  Remains inpatient appropriate because:Inpatient level of care appropriate due to severity of illness  Dispo: The patient is from: Home              Anticipated d/c is to: TOB...? Home               Patient currently is not medically stable to d/c.   Difficult to place patient No      Level of care: Stepdown   Procedures:   No admission procedures for hospital encounter.     Antimicrobials:  Anti-infectives (From admission, onward)   Start     Dose/Rate Route Frequency Ordered Stop   09/07/20 2200  vancomycin (VANCOREADY) IVPB 500 mg/100 mL        500 mg 100 mL/hr over 60 Minutes Intravenous Every 36 hours 09/06/20 1610     09/07/20 0400  meropenem (MERREM) 1 g in sodium chloride 0.9 %  100 mL IVPB        1 g 200 mL/hr over 30 Minutes Intravenous Every 12 hours 09/06/20 1610     09/06/20 1615  meropenem (MERREM) 1 g in sodium chloride 0.9 % 100 mL IVPB        1 g 200 mL/hr over 30 Minutes Intravenous Every 8 hours 09/06/20 1606     09/06/20 1600  vancomycin (VANCOCIN) IVPB 1000 mg/200 mL premix        1,000 mg 200 mL/hr over 60 Minutes Intravenous  Once 09/06/20 1545 09/06/20 1757   09/06/20 1545  aztreonam (AZACTAM) 2 g in sodium chloride 0.9 % 100 mL IVPB  Status:  Discontinued        2 g 200 mL/hr over 30 Minutes Intravenous  Once 09/06/20 1540 09/06/20 1601   09/06/20 1500  levofloxacin (LEVAQUIN) IVPB 750 mg  Status:  Discontinued        750 mg 100 mL/hr over 90 Minutes Intravenous  Once 09/06/20 1458 09/06/20 1606       Medication:  . albuterol  2.5 mg Nebulization TID  . aspirin EC  81 mg Oral Daily  . gabapentin  600 mg Oral Daily  . heparin  5,000 Units Subcutaneous Q8H  . hydrocortisone sod succinate (SOLU-CORTEF) inj  50 mg Intravenous Q6H  . mirtazapine  15 mg Oral QHS  . rosuvastatin  40 mg Oral QHS  . sucralfate  1 g Oral TID    acetaminophen **OR** acetaminophen, ALPRAZolam, guaiFENesin, ipratropium-albuterol, meclizine, methocarbamol, oxyCODONE, polyethylene glycol, traMADol   Objective:   Vitals:   09/07/20 0330 09/07/20 0430 09/07/20 0530 09/07/20 0630  BP: 95/65 (!) 85/61 104/63 102/68  Pulse: 68 65 (!) 58 64  Resp: 16 13 18 16   Temp:      TempSrc:      SpO2: 100% 97% 97% 100%  Weight:      Height:        Intake/Output Summary (Last 24 hours) at 09/07/2020 0701 Last data filed at 09/06/2020 1757 Gross per 24 hour  Intake 200 ml  Output --  Net 200 ml   Filed Weights   09/06/20 1452  Weight: 44.9 kg     Examination:   Physical Exam  Constitution: Awake alert, following commands, confused, pleasant Psychiatric: Confused, poor insight HEENT: Normocephalic, PERRL, otherwise with in Normal limits  Chest:Chest  symmetric Cardio vascular:  S1/S2, RRR, No murmure, No Rubs or Gallops  pulmonary: Clear to auscultation bilaterally, respirations unlabored, negative wheezes / crackles Abdomen: Soft, non-tender, non-distended, bowel sounds,no masses, no organomegaly Muscular skeletal: Generalized weaknesses, Limited exam - in bed, able to move all 4 extremities, Normal  strength,  Neuro: CNII-XII intact. , normal motor and sensation, reflexes intact  Extremities: No pitting edema lower extremities, +2 pulses  Skin: Dry, warm to touch, negative for any Rashes, No open wounds Wounds: per nursing documentation    ------------------------------------------------------------------------------------------------------------------------------------------    LABs:  CBC Latest Ref Rng & Units 09/07/2020 09/07/2020 09/06/2020  WBC 4.0 - 10.5 K/uL 0.5(LL) - 0.3(LL)  Hemoglobin 12.0 - 15.0 g/dL 5.7(LL) 5.8(LL) 7.3(L)  Hematocrit 36.0 - 46.0 % 18.1(L) 18.5(L) 23.4(L)  Platelets 150 - 400 K/uL 186 - 320   CMP Latest Ref Rng & Units 09/07/2020 09/06/2020 09/06/2020  Glucose 70 - 99 mg/dL 115(H) 124(H) 132(H)  BUN 8 - 23 mg/dL 38(H) 52(H) 51(H)  Creatinine 0.44 - 1.00 mg/dL 1.21(H) 2.05(H) 2.01(H)  Sodium 135 - 145 mmol/L 136 137 136  Potassium 3.5 - 5.1 mmol/L 3.8 3.3(L) 3.4(L)  Chloride 98 - 111 mmol/L 108 104 102  CO2 22 - 32 mmol/L 23 24 24   Calcium 8.9 - 10.3 mg/dL 7.6(L) 8.4(L) 8.6(L)  Total Protein 6.5 - 8.1 g/dL 5.0(L) 6.3(L) 6.5  Total Bilirubin 0.3 - 1.2 mg/dL 0.5 0.5 0.8  Alkaline Phos 38 - 126 U/L 37(L) 46 48  AST 15 - 41 U/L 12(L) 19 18  ALT 0 - 44 U/L 11 13 14        Micro Results Recent Results (from the past 240 hour(s))  Blood culture (routine single)     Status: None (Preliminary result)   Collection Time: 09/06/20  4:25 PM   Specimen: BLOOD LEFT ARM  Result Value Ref Range Status   Specimen Description BLOOD LEFT ARM  Final   Special Requests   Final    BOTTLES DRAWN AEROBIC AND ANAEROBIC  Blood Culture adequate volume Performed at [facility], [address], [place], [place] [address]    Culture PENDING  Incomplete   Report Status PENDING  Incomplete  Resp Panel by RT-PCR (Flu A&B, Covid) Nasopharyngeal Swab     Status: None   Collection Time: 09/06/20  6:44 PM   Specimen: Nasopharyngeal Swab; Nasopharyngeal(NP) swabs in vial transport medium  Result Value Ref Range Status   SARS Coronavirus 2 by RT PCR NEGATIVE NEGATIVE Final    Comment: (NOTE) SARS-CoV-2 target nucleic acids are NOT DETECTED.  The SARS-CoV-2 RNA is generally detectable in upper respiratory specimens during the acute phase of infection. The lowest concentration of SARS-CoV-2 viral copies this assay can detect is 138 copies/mL. A negative result does not preclude SARS-Cov-2 infection and should not be used as the sole basis for treatment or other patient management decisions. A negative result may occur with  improper specimen collection/handling, submission of specimen other than nasopharyngeal swab, presence of viral mutation(s) within the areas targeted by this assay, and inadequate number of viral copies(<138 copies/mL). A negative result must be combined with clinical observations, patient history, and epidemiological information. The expected result is Negative.  Fact Sheet for Patients:  EntrepreneurPulse.com.au  Fact Sheet for Healthcare Providers:  IncredibleEmployment.be  This test is no t yet approved or cleared by the [place] FDA and  has been authorized for detection and/or diagnosis of SARS-CoV-2 by FDA under an Emergency Use Authorization (EUA). This EUA will remain  in effect (meaning this test can be used) for the duration of the COVID-19 declaration under Section 564(b)(1) of the Act, 21 U.S.C.section 360bbb-3(b)(1), unless the authorization is terminated  or revoked sooner.       Influenza A by PCR NEGATIVE NEGATIVE Final  Influenza B by PCR NEGATIVE NEGATIVE Final    Comment: (NOTE) The Xpert Xpress SARS-CoV-2/FLU/RSV plus assay is intended as an aid in the diagnosis of influenza from Nasopharyngeal swab specimens and should not be used as a sole basis for treatment. Nasal washings and aspirates are unacceptable for Xpert Xpress SARS-CoV-2/FLU/RSV testing.  Fact Sheet for Patients: EntrepreneurPulse.com.au  Fact Sheet for Healthcare Providers: IncredibleEmployment.be  This test is not yet approved or cleared by the [place] FDA and has been authorized for detection and/or diagnosis of SARS-CoV-2 by FDA under an Emergency Use Authorization (EUA). This EUA will remain in effect (meaning this test can be used) for the duration of the COVID-19 declaration under Section 564(b)(1) of the Act, 21 U.S.C. section 360bbb-3(b)(1), unless the authorization is terminated or revoked.  Performed at [facility], [address] [place], [place] [address]     Radiology Reports CT Head Wo Contrast  Result Date: 09/06/2020 CLINICAL DATA:  History of small cell lung cancer. Brain metastases. EXAM: CT HEAD WITHOUT CONTRAST TECHNIQUE: Contiguous axial images were obtained from the base of the skull through the vertex without intravenous contrast. COMPARISON:  October 01, 2017.  June 13, 2020. FINDINGS: Brain: Mild chronic ischemic white matter disease is noted. Metastatic lesion seen adjacent to the atrium of the right lateral ventricle on prior MRI is not well visualized on this unenhanced study. No definite mass effect or midline shift is noted. No hemorrhage or acute infarction is noted. Ventricular size is within normal limits. Vascular: No hyperdense vessel or unexpected calcification. Skull: Normal. Negative for fracture or focal lesion. Sinuses/Orbits: No acute finding. Other: Fluid is noted in the right mastoid air cells. IMPRESSION: Metastatic lesion seen adjacent to the atrium  of right lateral ventricle on prior MRI of June 13, 2020 is not well visualized on this unenhanced study. Repeat MRI is recommended for evaluation of this lesion. No other significant intracranial abnormality is noted. Electronically Signed   By: Marijo Conception M.D.   On: 09/06/2020 16:23   NM PET Image Restag (PS) Skull Base To Thigh  Result Date: 08/16/2020 CLINICAL DATA:  Subsequent treatment strategy for small cell lung cancer. EXAM: NUCLEAR MEDICINE PET SKULL BASE TO THIGH TECHNIQUE: 5.4 mCi F-18 FDG was injected intravenously. Full-ring PET imaging was performed from the skull base to thigh after the radiotracer. CT data was obtained and used for attenuation correction and anatomic localization. Fasting blood glucose: 96 mg/dl COMPARISON:  PET-CT dated 05/26/2020 FINDINGS: Mediastinal blood pool activity: SUV max 2.9 Liver activity: SUV max NA NECK: No hypermetabolic cervical lymphadenopathy. Incidental CT findings: none CHEST: 2.0 cm short axis subcarinal node (series 4/image 70), max SUV 5.2, previously 2.7 cm with max SUV 8.7. 13 mm short axis right perihilar node (series 4/image 32), max SUV 6.8, previously 18 mm with max SUV 8.9. Status post right upper lobectomy. Prior hypermetabolism in the right suprahilar region along the staple line has resolved. Scarring with subpleural reticulation in the right lung. Moderate centrilobular and paraseptal emphysematous changes. No suspicious pulmonary nodules. Left mastectomy with reconstruction and left axillary lymph node dissection. Left chest port terminates in the lower SVC. Incidental CT findings: Atherosclerotic calcifications of the aortic arch. Hypodense blood pool relative to myocardium, suggesting anemia. Mild coronary atherosclerosis of the LAD and left circumflex. Mild anterior pericardial fluid. ABDOMEN/PELVIS: No abnormal hypermetabolism in the liver, spleen, pancreas, or adrenal glands. No hypermetabolic abdominopelvic lymphadenopathy.  Incidental CT findings: Stable 2.6 cm right adrenal adenoma. Bilateral renal cysts.  Atherosclerotic calcifications the abdominal aorta and branch vessels. Status post hysterectomy with suspected pelvic floor laxity. SKELETON: No focal hypermetabolic activity to suggest skeletal metastasis. Incidental CT findings: Cervical and lumbar spine fixation hardware. Degenerative changes of the visualized thoracolumbar spine. IMPRESSION: Status post right upper lobectomy. Mediastinal and right perihilar nodal metastases, improved. Status post left mastectomy with left axillary lymph node dissection. Additional stable ancillary findings as above. Electronically Signed   By: Julian Hy M.D.   On: 08/16/2020 11:09   DG Chest Port 1 View  Result Date: 09/06/2020 CLINICAL DATA:  Questionable sepsis - evaluate for abnormality Confusion. EXAM: PORTABLE CHEST 1 VIEW COMPARISON:  Radiograph 08/21/2019.  CT 09/15/2019 FINDINGS: Accessed left chest port in place. Postsurgical volume loss in the right hemithorax with right tracheal deviation, unchanged. Upper normal heart size with aortic atherosclerosis and tortuosity. There is scarring in the periphery of the right mid lung, as well as apical pleuroparenchymal thickening. Patchy airspace opacities at the left lung base are new from prior exam. No pneumothorax or pleural effusion. Bones under mineralized. Surgical and lumbar fusion hardware. IMPRESSION: 1. Patchy airspace opacities at the left lung base, new from prior exam, suspicious for pneumonia. Recommend radiologic follow-up. 2. Stable postsurgical volume loss and scarring in the right hemithorax. Electronically Signed   By: Keith Rake M.D.   On: 09/06/2020 15:53    SIGNED: Deatra James, MD, FHM. [facility],  Pager (please use [email or web address] to page/text) Please use Epic Secure Chat for non-urgent communication (7AM-7PM)  If 7PM-7AM, please contact night-coverage [email or web address], 09/07/2020, 7:01  AM

## 2020-09-08 DIAGNOSIS — Z515 Encounter for palliative care: Secondary | ICD-10-CM | POA: Diagnosis not present

## 2020-09-08 DIAGNOSIS — D709 Neutropenia, unspecified: Secondary | ICD-10-CM | POA: Diagnosis not present

## 2020-09-08 DIAGNOSIS — R5081 Fever presenting with conditions classified elsewhere: Secondary | ICD-10-CM | POA: Diagnosis not present

## 2020-09-08 DIAGNOSIS — J189 Pneumonia, unspecified organism: Secondary | ICD-10-CM

## 2020-09-08 DIAGNOSIS — Z7189 Other specified counseling: Secondary | ICD-10-CM | POA: Diagnosis not present

## 2020-09-08 LAB — TYPE AND SCREEN
ABO/RH(D): AB POS
Antibody Screen: NEGATIVE
Unit division: 0
Unit division: 0

## 2020-09-08 LAB — BPAM RBC
Blood Product Expiration Date: 202206232359
Blood Product Expiration Date: 202206242359
ISSUE DATE / TIME: 202205250913
ISSUE DATE / TIME: 202205251309
Unit Type and Rh: 6200
Unit Type and Rh: 6200

## 2020-09-08 LAB — PATHOLOGIST SMEAR REVIEW

## 2020-09-08 LAB — URINE CULTURE: Culture: NO GROWTH

## 2020-09-08 MED ORDER — TBO-FILGRASTIM 300 MCG/0.5ML ~~LOC~~ SOSY
300.0000 ug | PREFILLED_SYRINGE | Freq: Once | SUBCUTANEOUS | Status: DC
  Filled 2020-09-08: qty 0.5

## 2020-09-08 MED ORDER — CLONAZEPAM 0.5 MG PO TABS
0.5000 mg | ORAL_TABLET | Freq: Every day | ORAL | Status: DC
  Administered 2020-09-08: 0.5 mg via ORAL
  Filled 2020-09-08: qty 1

## 2020-09-08 NOTE — Progress Notes (Addendum)
Palliative:   Mrs. Willbanks is lying quietly in bed. She appears acutely/chrnically ill and frail. She greets me making and somewhat keeping eye contact.  She is alert and oriented, but has periods of confusion.  Her daughter, [name], is at bedside.  Daughter Amy is present via phone.  We talk in detail about her acute health concerns, pneumonia.  We talked about selected labs and the treatment plan.  We talked about diet and nutrition, Remeron.  We talked about the importance of staying mobile.  At this point patient and family shared that she would go home, they would not consider rehab.  Family is able to provide 24/7 care, and seems very knowledgeable about Mrs. Cobbs treatment plan and disease process.  Their goal is to follow-up with oncology as directed.  Conference with attending, bedside nursing staff, transition of care team related to patient condition, needs, goals of care, disposition.  Plan:  Continue to treat treatable, Full code/scope.  Home with [place] if needed. Oncology outpatient.   68 minutes  Quinn Axe, NP Palliative Medicine Team  Team phone [phone] Greater than 50% of this time was spent counseling and coordinating care related to the above assessment and plan.

## 2020-09-08 NOTE — TOC Initial Note (Signed)
Transition of Care [facility]) - Initial/Assessment Note    Patient Details  Name: Ashley [name] MRN: [id number] Date of Birth: 1950-11-04  Transition [facility] Care [facility]) CM/SW Contact:    Natasha Bence, LCSW Phone Number: 09/08/2020, 12:21 PM  Clinical Narrative:                 Patient is a 69 year old female admitted for Neutropic fever. CSW observed patient's high readmission risks score. CSW conducted readmission risk assessment. CSW also conducted initial assessment. Patient lives at home and receives regular assistance with her ADL's from family members. Patient is not independent at baseline due to nature of cancer treatments. Patient's daughter reported that patient is active with HH, but was not able to recall which agency. CSW searched patient in [place]. PING reflected that patient is active with [facility]. TOC to follow.   Expected Discharge Plan: [place] Barriers to Discharge: Continued Medical Work up   Patient Goals and CMS Choice Patient states their goals for this hospitalization and ongoing recovery are:: Return home with [facility] CMS Medicare.gov Compare Post Acute Care list provided to:: Patient Choice offered to / list presented to : Patient  Expected Discharge Plan and Services Expected Discharge Plan: [place] Choice: [place] arrangements for the past 2 months: Single Family Home                                      Prior Living Arrangements/Services Living arrangements for the past 2 months: Single Family Home Lives with:: Self,Adult Children Patient language and need for interpreter reviewed:: No Do you feel safe going back to the place where you live?: Yes      Need for Family Participation in Patient Care: Yes (Comment) Care giver support system in place?: Yes (comment) Current home services: Home PT,Home RN Criminal Activity/Legal Involvement Pertinent to Current Situation/Hospitalization: No  - Comment as needed  Activities of Daily Living Home Assistive Devices/Equipment: Eyeglasses,Dentures (specify type) ADL Screening (condition at time of admission) Patient's cognitive ability adequate to safely complete daily activities?: Yes Is the patient deaf or have difficulty hearing?: No Does the patient have difficulty seeing, even when wearing glasses/contacts?: No Does the patient have difficulty concentrating, remembering, or making decisions?: Yes Patient able to express need for assistance with ADLs?: Yes Does the patient have difficulty dressing or bathing?: No Independently performs ADLs?: Yes (appropriate for developmental age) Does the patient have difficulty walking or climbing stairs?: Yes Weakness of Legs: Both Weakness of Arms/Hands: None  Permission Sought/Granted Permission sought to share information with : Facility Art therapist granted to share information with : Yes, Verbal Permission Granted  Share Information with NAME: Donnal Debar  Permission granted to share info w AGENCY: [facility]  Permission granted to share info w Relationship: Daughter  Permission granted to share info w Contact Information: [phone]  Emotional Assessment       Orientation: : Oriented to Self Alcohol / Substance Use: Not Applicable Psych Involvement: No (comment)  Admission diagnosis:  Neutropenic fever ([place]) [D70.9, R50.81] Sepsis ([place]) [A41.9] Sepsis, due to unspecified organism, unspecified whether acute organ dysfunction present [facility]) [A41.9] Patient Active Problem List   Diagnosis Date Noted  . Neutropenic fever ([place]) 09/06/2020  . Sepsis ([place]) 09/06/2020  . Community acquired pneumonia 09/06/2020  . Acute metabolic encephalopathy 30/94/0768  .  Anemia associated with chemotherapy 09/06/2020  . Brain metastasis ([place]) 06/03/2020  . Abdominal aortic aneurysm without rupture ([place]) 05/05/2020  . Acquired absence of both cervix and uterus 05/05/2020   . Acute renal failure ([place]) 05/05/2020  . Allergic contact dermatitis due to plants, except food 05/05/2020  . Benign hypertension 05/05/2020  . Body mass index (BMI) 19.9 or less, adult 05/05/2020  . Chronic pain 05/05/2020  . Chronic pancreatitis ([place]) 05/05/2020  . Disorder of intestine 05/05/2020  . Fatigue 05/05/2020  . Liver disease 05/05/2020  . History of left mastectomy 05/05/2020  . Hyperglycemia 05/05/2020  . Left flank pain 05/05/2020  . Macrocytic anemia 05/05/2020  . Malignant neoplasm of female breast ([place]) 05/05/2020  . Mixed hyperlipidemia 05/05/2020  . Osteopenia 05/05/2020  . Peripheral edema 05/05/2020  . Status post lobectomy of lung 05/05/2020  . Upper abdominal pain 05/05/2020  . Vitamin D deficiency 05/05/2020  . Weight loss 05/05/2020  . Gastroesophageal reflux disease 05/05/2020  . Hearing loss 12/28/2019  . History of external beam radiation therapy 12/28/2019  . Goals of care, counseling/discussion 11/30/2019  . Small vessel coronary artery disease 06/23/2019  . CKD (chronic kidney disease) 03/09/2019  . Nodule of upper lobe of right lung 12/30/2018  . 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 Pneumothorax after biopsy 11/21/2018  . History of breast cancer 11/21/2018  . 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 Hiatal hernia 02/25/2017  . Tubular adenoma of colon 02/25/2017  . Abnormal CT of the chest 02/18/2017  . Esophageal dysphagia 02/18/2017  . Pernicious anemia 03/06/2016  . Dyspnea   . Non-ischemic cardiomyopathy ([place])   . 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 Melanoma ([place]) 01/09/2016  . Thrombocythemia, essential ([name]) 01/09/2016  . Chronic obstructive pulmonary disease with (acute) exacerbation ([place]) 11/17/2015  . Panlobular emphysema ([place]) 11/17/2015  . CHF exacerbation ([place]) 11/17/2015  . Radiculopathy 11/16/2015  . Acute right-sided low back pain with sciatica 07/19/2015  .  Multiple pulmonary nodules 01/18/2015  . Hypokalemia 01/18/2015  . Status post vaginal hysterectomy 12/09/2014  . BRCA negative 07/21/2014   PCP:  [name], NP Pharmacy:   [facility] [place] [address] Phone: [phone] Fax: [phone]     Social Determinants of Health (SDOH) Interventions    Readmission Risk Interventions Readmission Risk Prevention Plan 09/08/2020  Medication Review ([place]) Complete  PCP or Specialist appointment within 3-5 days of discharge Complete  HRI or [place] Complete  SW Recovery Care/Counseling Consult Complete  [place] Not Applicable  Some recent data might be hidden

## 2020-09-08 NOTE — Progress Notes (Signed)
PROGRESS NOTE    Patient: Ashley [name]                            PCP: [name], NP                    DOB: [birth year]            DOA: 09/06/2020 JSH:[id number]             DOS: 09/08/2020, 12:14 PM   LOS: 2 days   Date of Service: The patient was seen and examined on 09/08/2020  Subjective:   The patient was seen and examined this morning, remained stable much more awake alert, participating in exam, still complaining of generalized weaknesses. Hemodynamically stable this morning: Afebrile normotensive Hemoglobin has improved 5.7, 6.7, 9.6 this a.m. (tolerated 2U PRBC transfusion yesterday)    Brief Narrative:   Ashley [name]  is a 69 y.o. female, with history of vitamin D deficiency, non-small cell lung cancer on chemotherapy, hypertension, hyperlipidemia, GERD, degenerative joint disease, coronary artery disease, CHF, and more presents the ED with a chief complaint of altered mental status.    Daughter at bedside reports that patient has been overly fatigued and confused at home.  Daughter reports that patient did have chemotherapy last week, she gets chemotherapy every 3 weeks.  The week after her chemo treatment, it is normal for her to be fatigued.  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  Daughter feels like her confusion has been worse and her fatigue has been worse than what is her normal.  She reports that patient is normally able to recognize family, but sometimes has trouble with word finding and memory.    Daughter called Dr. Raliegh Ip who recommended going to the ER per daughter report.  Daughter does report that she has had episodes of confusion like this in the past when she has had an infection.   She also reports that the patient has not been eating or drinking normally.    When she is on her Remeron, her appetite seems much improved.  Daughter reports that at least 3 days or patient can even take 1 bite of food.     Oxygen saturations are reading low in the room, daughter reports that patient has Raynaud's and O2 sats are usually not accurate with pulse ox.  Daughter reports that patient has been dehydrated due to the poor p.o. intake, and received fluid boluses last Thursday and Friday with her chemo.  As far as daughter knows patient has not complained of any pains or symptoms that are a change from her normal.  Patient is a former smoker, does not drink alcohol, does not use illicit drugs.  Patient is vaccinated for COVID.   Full CODE  In the ED T max 101.1, HR 90-118, RR 12-26, BP trending down at admission, currently 82/59, satting at 95% Leukopenia with a white blood cell count of 0.3, hemoglobin 7.3, hematocrit 23.4, but platelets within normal at 320 Hypokalemia at 3.3, and an AKI with a creatinine of 2.01.  8 days ago creatinine was 1.19 CT head recommends MRI to better characterize metastatic lesion that was seen previously, but not well visualized on this image Chest x-ray shows left lung base opacities EKG shows sinus tachycardia 111, QTc 487 1.3 L bolus in ED, 1 L bolus at admission, continue LR at 150 MLS  per hour Tylenol given for fever Pharmacy consulted for dosing of vancomycin and Merrem UA and UC pending Blood culture pending Admission requested for further evaluation and treatment of neutropenic fever, SEPSIS      Assessment & Plan:   Principal Problem:   Neutropenic fever ([place]) Active Problems:   Acute renal failure ([place])   Hypokalemia   Sepsis ([place])   Community acquired pneumonia   Acute metabolic encephalopathy   Anemia associated with chemotherapy     1. Sepsis - POA with - Neutropenic fever 1. Much improved sepsis physiology this a.m., afebrile, normotensive 2. On admission : Met sepsis criteria (Tmax 101.1, WBC 0.3, RR 23, HR 118 3. White blood cell count 2 hours prior to presentation to ER was 1.0, white blood cell count 8 days ago was 12.1 4. Chest  x-ray shows left lung base pneumonia 5. Continue broad-spectrum antibiotics vancomycin, meropenem 6. Blood culture >> no growth to date 7. Urine culture>>> no growth to date we will continue to monitor the patient closely on telemetry  2. Sepsis secondary to community-acquired pneumonia 1. Hemodynamic stable now, 2. Lactic acidosis, 2.5, 2.7, 2.7>>> 1.5, 1.8 3. WBC count 0.5, 1.0 >> 1.0 4. Endorgan damage with an AKI  and the lactic acidosis >> improved 5. Status post IV fluid resuscitation aggressive per sepsis protocol, continue maintenance fluid  6. Blood cultures, urine culture >>>> no growth to date 7. Chest x-ray shows pneumonia as above 8. Started on broad-spectrum antibiotics with vancomycin and Merrem 9. Management per sepsis physiology  3. Community-acquired pneumonia - See plan above  4. Acute metabolic encephalopathy 1. Mentation has improved back to baseline 2. Multifactorial likely due to acute infection, sepsis 3. CT head recommends repeat MRI as previously seen metastatic lesion adjacent to the atrium of the right lateral ventricle is not well visualized 4. MRA head -inconclusive 5. Metabolic encephalopathy likely might be factorial -brain mets resulting in occasional confusion at baseline, polypharmacy with multiple pain medications, infection, hypotension 6. Continue to treat infection, use pain meds only as needed 5. AKI 1. Secondary to hypovolemia/prerenal 2. Boluses as above continue IV hydration 3. Creatinine 2.01, 2.05, 1.21  6. Hypokalemia 1. IV potassium 2. Potassium 3.4, 3.3, 3.8 today 7. Acute on chronic anemia 1. Hemoglobin dropped  8.0, 7.3 >>> 5.7 >>> 9.6 2. Likely secondary to chemotherapy 3. FOBT negative 4. Symptomatic anemia, status posttransfusion with 2U PRBC on 09/08/2020 5. Pros and cons of blood transfusion was discussed with the patient and her daughter--consent to proceed with transfusion was  given.  ----------------------------------------------------------------------------------------------------------------------------------------------- Nutritional status:  The patient's BMI is: Body mass index is 18.11 kg/m. I agree with the assessment and plan as outlined    --------------------------------------------------------------------------------------------------------------------------------------- Cultures; Blood Cultures x 2 >> NGT Urine Culture  >>> NGT  Sputum Culture >> NGT   Antimicrobials: 09/06/2020 meropenem/vancomycin >>    Consultants: Palliative, oncology   ---------------------------------------------------------------------------------------------------------------------------------------  DVT prophylaxis:  SCD/Compression stockings Code Status:   Code Status: Full Code  Family Communication:   Attempt will be made to update daily The above findings and plan of care has been discussed with patient (and family)  in detail,  they expressed understanding and agreement of above. -Advance care planning has been discussed.   Admission status:   Status is: Inpatient  Remains inpatient appropriate because:Inpatient level of care appropriate due to severity of illness   Dispo: The patient is from: Home              Anticipated d/c  is to: TOB...? Home               Patient currently is not medically stable to d/c.   Difficult to place patient No      Level of care: Telemetry   Procedures:   No admission procedures for hospital encounter.     Antimicrobials:  Anti-infectives (From admission, onward)   Start     Dose/Rate Route Frequency Ordered Stop   09/08/20 1600  vancomycin (VANCOREADY) IVPB 750 mg/150 mL        750 mg 150 mL/hr over 60 Minutes Intravenous Every 48 hours 09/07/20 0854     09/07/20 2200  vancomycin (VANCOREADY) IVPB 500 mg/100 mL  Status:  Discontinued        500 mg 100 mL/hr over 60 Minutes Intravenous Every 36  hours 09/06/20 1610 09/07/20 0854   09/07/20 0400  meropenem (MERREM) 1 g in sodium chloride 0.9 % 100 mL IVPB        1 g 200 mL/hr over 30 Minutes Intravenous Every 12 hours 09/06/20 1610     09/06/20 1615  meropenem (MERREM) 1 g in sodium chloride 0.9 % 100 mL IVPB  Status:  Discontinued        1 g 200 mL/hr over 30 Minutes Intravenous Every 8 hours 09/06/20 1606 09/07/20 0846   09/06/20 1600  vancomycin (VANCOCIN) IVPB 1000 mg/200 mL premix        1,000 mg 200 mL/hr over 60 Minutes Intravenous  Once 09/06/20 1545 09/06/20 1757   09/06/20 1545  aztreonam (AZACTAM) 2 g in sodium chloride 0.9 % 100 mL IVPB  Status:  Discontinued        2 g 200 mL/hr over 30 Minutes Intravenous  Once 09/06/20 1540 09/06/20 1601   09/06/20 1500  levofloxacin (LEVAQUIN) IVPB 750 mg  Status:  Discontinued        750 mg 100 mL/hr over 90 Minutes Intravenous  Once 09/06/20 1458 09/06/20 1606       Medication:  . sodium chloride   Intravenous Once  . albuterol  2.5 mg Nebulization TID  . aspirin EC  81 mg Oral Daily  . Chlorhexidine Gluconate Cloth  6 each Topical Daily  . gabapentin  600 mg Oral Daily  . heparin  5,000 Units Subcutaneous Q8H  . hydrocortisone sod succinate (SOLU-CORTEF) inj  50 mg Intravenous Q6H  . mirtazapine  15 mg Oral QHS  . rosuvastatin  40 mg Oral QHS  . sucralfate  1 g Oral TID    acetaminophen **OR** acetaminophen, ALPRAZolam, guaiFENesin, ipratropium-albuterol, meclizine, methocarbamol, oxyCODONE, polyethylene glycol, traMADol   Objective:   Vitals:   09/08/20 0023 09/08/20 0142 09/08/20 0416 09/08/20 0717  BP: 132/82 136/85 (!) 138/95   Pulse: 66 68 (!) 56   Resp: _0 Temp: 98.4 F (36.9 C) 98.4 F (36.9 C) 97.6 F (36.4 C)   TempSrc: Oral Oral Oral   SpO2: 99% 94% 100% 98%  Weight:      Height:        Intake/Output Summary (Last 24 hours) at 09/08/2020 1214 Last data filed at 09/08/2020 0346 Gross per 24 hour  Intake 754.06 ml  Output --  Net  754.06 ml   Filed Weights   09/06/20 1452  Weight: 44.9 kg     Examination:      Physical Exam:   General:  Alert, oriented, cooperative, no distress;   HEENT:  Normocephalic, PERRL, otherwise with in Normal limits  Neuro:  CNII-XII intact. , normal motor and sensation, reflexes intact   Lungs:   Clear to auscultation BL, Respirations unlabored, no wheezes / crackles  Cardio:    S1/S2, RRR, No murmure, No Rubs or Gallops   Abdomen:   Soft, non-tender, bowel sounds active all four quadrants,  no guarding or peritoneal signs.  Muscular skeletal:   Generalized weaknesses Limited exam - in bed, able to move all 4 extremities, Normal strength,  2+ pulses,  symmetric, No pitting edema  Skin:  Dry, warm to touch, negative for any Rashes,  Wounds: Please see nursing documentation            LABs:  CBC Latest Ref Rng & Units 09/07/2020 09/07/2020 09/07/2020  WBC 4.0 - 10.5 K/uL - 1.0(LL) 0.5(LL)  Hemoglobin 12.0 - 15.0 g/dL 9.6(L) 6.7(LL) 5.7(LL)  Hematocrit 36.0 - 46.0 % 28.8(L) 21.4(L) 18.1(L)  Platelets 150 - 400 K/uL - 239 186   CMP Latest Ref Rng & Units 09/07/2020 09/06/2020 09/06/2020  Glucose 70 - 99 mg/dL 115(H) 124(H) 132(H)  BUN 8 - 23 mg/dL 38(H) 52(H) 51(H)  Creatinine 0.44 - 1.00 mg/dL 1.21(H) 2.05(H) 2.01(H)  Sodium 135 - 145 mmol/L 136 137 136  Potassium 3.5 - 5.1 mmol/L 3.8 3.3(L) 3.4(L)  Chloride 98 - 111 mmol/L 108 104 102  CO2 22 - 32 mmol/L _0 Calcium 8.9 - 10.3 mg/dL 7.6(L) 8.4(L) 8.6(L)  Total Protein 6.5 - 8.1 g/dL 5.0(L) 6.3(L) 6.5  Total Bilirubin 0.3 - 1.2 mg/dL 0.5 0.5 0.8  Alkaline Phos 38 - 126 U/L 37(L) 46 48  AST 15 - 41 U/L 12(L) 19 18  ALT 0 - 44 U/L _1 Micro Results Recent Results (from the past 240 hour(s))  Blood culture (routine single)     Status: None (Preliminary result)   Collection Time: 09/06/20  4:25 PM   Specimen: BLOOD LEFT ARM  Result Value Ref Range Status   Specimen Description BLOOD LEFT ARM  Final    Special Requests   Final    BOTTLES DRAWN AEROBIC AND ANAEROBIC Blood Culture adequate volume   Culture   Final    NO GROWTH 2 DAYS Performed at [facility], [address], [place], [place] [address]    Report Status PENDING  Incomplete  Urine culture     Status: None   Collection Time: 09/06/20  5:26 PM   Specimen: In/Out Cath Urine  Result Value Ref Range Status   Specimen Description   Final    IN/OUT CATH URINE Performed at [facility], [address], [place], [place] [address]    Special Requests   Final    Immunocompromised Performed at [facility], [address] [place], [place] [address]    Culture   Final    NO GROWTH Performed at [facility], [place] [address] [place],  [address]    Report Status 09/08/2020 FINAL  Final  Resp Panel by RT-PCR (Flu A&B, Covid) Nasopharyngeal Swab     Status: None   Collection Time: 09/06/20  6:44 PM   Specimen: Nasopharyngeal Swab; Nasopharyngeal(NP) swabs in vial transport medium  Result Value Ref Range Status   SARS Coronavirus 2 by RT PCR NEGATIVE NEGATIVE Final    Comment: (NOTE) SARS-CoV-2 target nucleic acids are NOT DETECTED.  The SARS-CoV-2 RNA is generally detectable in upper respiratory specimens during the acute phase of infection. The lowest concentration of SARS-CoV-2 viral copies this  assay can detect is 138 copies/mL. A negative result does not preclude SARS-Cov-2 infection and should not be used as the sole basis for treatment or other patient management decisions. A negative result may occur with  improper specimen collection/handling, submission of specimen other than nasopharyngeal swab, presence of viral mutation(s) within the areas targeted by this assay, and inadequate number of viral copies(<138 copies/mL). A negative result must be combined with clinical observations, patient history, and epidemiological information. The expected result is Negative.  Fact Sheet for Patients:   EntrepreneurPulse.com.au  Fact Sheet for Healthcare Providers:  IncredibleEmployment.be  This test is no t yet approved or cleared by the [place] FDA and  has been authorized for detection and/or diagnosis of SARS-CoV-2 by FDA under an Emergency Use Authorization (EUA). This EUA will remain  in effect (meaning this test can be used) for the duration of the COVID-19 declaration under Section 564(b)(1) of the Act, 21 U.S.C.section 360bbb-3(b)(1), unless the authorization is terminated  or revoked sooner.       Influenza A by PCR NEGATIVE NEGATIVE Final   Influenza B by PCR NEGATIVE NEGATIVE Final    Comment: (NOTE) The Xpert Xpress SARS-CoV-2/FLU/RSV plus assay is intended as an aid in the diagnosis of influenza from Nasopharyngeal swab specimens and should not be used as a sole basis for treatment. Nasal washings and aspirates are unacceptable for Xpert Xpress SARS-CoV-2/FLU/RSV testing.  Fact Sheet for Patients: EntrepreneurPulse.com.au  Fact Sheet for Healthcare Providers: IncredibleEmployment.be  This test is not yet approved or cleared by the [place] FDA and has been authorized for detection and/or diagnosis of SARS-CoV-2 by FDA under an Emergency Use Authorization (EUA). This EUA will remain in effect (meaning this test can be used) for the duration of the COVID-19 declaration under Section 564(b)(1) of the Act, 21 U.S.C. section 360bbb-3(b)(1), unless the authorization is terminated or revoked.  Performed at [facility], [address] [place], [place] [address]     Radiology Reports CT Head Wo Contrast  Result Date: 09/06/2020 CLINICAL DATA:  History of small cell lung cancer. Brain metastases. EXAM: CT HEAD WITHOUT CONTRAST TECHNIQUE: Contiguous axial images were obtained from the base of the skull through the vertex without intravenous contrast. COMPARISON:  October 01, 2017.   June 13, 2020. FINDINGS: Brain: Mild chronic ischemic white matter disease is noted. Metastatic lesion seen adjacent to the atrium of the right lateral ventricle on prior MRI is not well visualized on this unenhanced study. No definite mass effect or midline shift is noted. No hemorrhage or acute infarction is noted. Ventricular size is within normal limits. Vascular: No hyperdense vessel or unexpected calcification. Skull: Normal. Negative for fracture or focal lesion. Sinuses/Orbits: No acute finding. Other: Fluid is noted in the right mastoid air cells. IMPRESSION: Metastatic lesion seen adjacent to the atrium of right lateral ventricle on prior MRI of June 13, 2020 is not well visualized on this unenhanced study. Repeat MRI is recommended for evaluation of this lesion. No other significant intracranial abnormality is noted. Electronically Signed   By: Marijo Conception M.D.   On: 09/06/2020 16:23   MR ANGIO HEAD WO CONTRAST  Result Date: 09/07/2020 CLINICAL DATA:  Metastatic cancer. EXAM: MRA HEAD WITHOUT CONTRAST TECHNIQUE: Angiographic images of the Circle of Willis were acquired using MRA technique without intravenous contrast. COMPARISON:  MRI head same day.  CT head yesterday. FINDINGS: Anterior circulation: Both internal carotid arteries are patent through the skull base and siphon regions. The anterior and  middle cerebral vessels show flow without evidence of proximal stenosis, aneurysm or vascular malformation. Posterior circulation: Both vertebral arteries are patent to the basilar. The right is dominant. No basilar stenosis. Posterior circulation branch vessels show flow. Anatomic variants: None significant. Other: None IMPRESSION: Some motion degradation, but otherwise negative intracranial MR angiography of the large and medium sized vessels. Was the intention to order a contrast enhanced brain MRI for evaluation of the presumed intracranial metastatic lesions? Electronically Signed   By:  Nelson Chimes M.D.   On: 09/07/2020 12:14   MR BRAIN WO CONTRAST  Result Date: 09/07/2020 CLINICAL DATA:  Altered mental status. Confusion. Metastatic lung cancer. EXAM: MRI HEAD WITHOUT CONTRAST TECHNIQUE: Multiplanar, multiecho pulse sequences of the brain and surrounding structures were obtained without intravenous contrast. COMPARISON:  Head CT 09/06/2020.  MRI 06/13/2020. FINDINGS: Brain: This examination was ordered without contrast, which hinders evaluation of metastatic brain disease. However, in this case, I think that small foci of restricted diffusion likely represent what would be enhancing brain lesions in will be enumerated below. No focal abnormality affects the brainstem or cerebellum. Previously seen treated lesion adjacent to the occipital horn of the right lateral ventricle with some regional edema has involuted considerably, consistent with successful treatment. Newly seen foci of restricted diffusion which are felt more likely represent metastatic lesions than multiple embolic infarctions are present as follows. Three punctate foci in the left occipital lobe diffusion images 15 through 17. 3 mm focus along the lateral margin of the occipital horn of the left lateral ventricle diffusion image 20. Punctate focus along the lateral margin of the occipital horn of the right lateral ventricle, possibly just above the previously treated lesion, image 23. 5 x 6 mm focus adjacent to the medial margin of the atrium of the left lateral ventricle image 25. 5 mm focus within the left anterior superior temporal lobe with mild regional edema, image 26. Other small foci along the append mole margins of the lateral ventricle in numerous locations, most notable at the posterior body of the left lateral ventricle image 30. 3 mm focus in the white matter of the left posterior frontal lobe image 33. We may, in this case, be dealing with a combination of new hematogenous metastatic lesions as well as potential  ependymal spread of disease due to the previous ependymal involvement in the occipital horn of the right lateral ventricle. At some point, postcontrast imaging will help evaluate these lesions more completely. Vascular: Major vessels at the base of the brain show flow. Skull and upper cervical spine: No calvarial lesion. Previous C3 findings not well evaluated. Sinuses/Orbits: Paranasal sinuses are clear. Orbits are negative. Small left mastoid effusion extensive right mastoid effusion as seen previously. Other: None IMPRESSION: This study was ordered and performed without contrast. Favorable evolutionary changes of the previously treated lesion adjacent to the occipital horn of the right lateral ventricle. Resolution of edema in that area. New foci of restricted diffusion that I think represent metastatic lesions more likely than embolic infarctions, though there could always be a combination of both processes. These include parenchymal foci in the left occipital lobe, left anterior superior temporal lobe and left posterior frontal white matter. 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 At some point, postcontrast imaging would be useful to better understand the findings. Electronically Signed   By: Nelson Chimes M.D.   On: 09/07/2020 08:07   NM PET Image  Restag (PS) Skull Base To Thigh  Result Date: 08/16/2020 CLINICAL DATA:  Subsequent treatment strategy for small cell lung cancer. EXAM: NUCLEAR MEDICINE PET SKULL BASE TO THIGH TECHNIQUE: 5.4 mCi F-18 FDG was injected intravenously. Full-ring PET imaging was performed from the skull base to thigh after the radiotracer. CT data was obtained and used for attenuation correction and anatomic localization. Fasting blood glucose: 96 mg/dl COMPARISON:  PET-CT dated 05/26/2020 FINDINGS: Mediastinal blood pool activity: SUV max 2.9 Liver activity: SUV max NA NECK:  No hypermetabolic cervical lymphadenopathy. Incidental CT findings: none CHEST: 2.0 cm short axis subcarinal node (series 4/image 70), max SUV 5.2, previously 2.7 cm with max SUV 8.7. 13 mm short axis right perihilar node (series 4/image 32), max SUV 6.8, previously 18 mm with max SUV 8.9. Status post right upper lobectomy. Prior hypermetabolism in the right suprahilar region along the staple line has resolved. Scarring with subpleural reticulation in the right lung. Moderate centrilobular and paraseptal emphysematous changes. No suspicious pulmonary nodules. Left mastectomy with reconstruction and left axillary lymph node dissection. Left chest port terminates in the lower SVC. Incidental CT findings: Atherosclerotic calcifications of the aortic arch. Hypodense blood pool relative to myocardium, suggesting anemia. Mild coronary atherosclerosis of the LAD and left circumflex. Mild anterior pericardial fluid. ABDOMEN/PELVIS: No abnormal hypermetabolism in the liver, spleen, pancreas, or adrenal glands. No hypermetabolic abdominopelvic lymphadenopathy. Incidental CT findings: Stable 2.6 cm right adrenal adenoma. Bilateral renal cysts. Atherosclerotic calcifications the abdominal aorta and branch vessels. Status post hysterectomy with suspected pelvic floor laxity. SKELETON: No focal hypermetabolic activity to suggest skeletal metastasis. Incidental CT findings: Cervical and lumbar spine fixation hardware. Degenerative changes of the visualized thoracolumbar spine. IMPRESSION: Status post right upper lobectomy. Mediastinal and right perihilar nodal metastases, improved. Status post left mastectomy with left axillary lymph node dissection. Additional stable ancillary findings as above. Electronically Signed   By: Julian Hy M.D.   On: 08/16/2020 11:09   DG Chest Port 1 View  Result Date: 09/06/2020 CLINICAL DATA:  Questionable sepsis - evaluate for abnormality Confusion. EXAM: PORTABLE CHEST 1 VIEW  COMPARISON:  Radiograph 08/21/2019.  CT 09/15/2019 FINDINGS: Accessed left chest port in place. Postsurgical volume loss in the right hemithorax with right tracheal deviation, unchanged. Upper normal heart size with aortic atherosclerosis and tortuosity. There is scarring in the periphery of the right mid lung, as well as apical pleuroparenchymal thickening. Patchy airspace opacities at the left lung base are new from prior exam. No pneumothorax or pleural effusion. Bones under mineralized. Surgical and lumbar fusion hardware. IMPRESSION: 1. Patchy airspace opacities at the left lung base, new from prior exam, suspicious for pneumonia. Recommend radiologic follow-up. 2. Stable postsurgical volume loss and scarring in the right hemithorax. Electronically Signed   By: Keith Rake M.D.   On: 09/06/2020 15:53    SIGNED: Deatra James, MD, FHM. [facility],  Pager (please use [email or web address] to page/text) Please use Epic Secure Chat for non-urgent communication (7AM-7PM)  If 7PM-7AM, please contact night-coverage [email or web address], 09/08/2020, 12:14 PM

## 2020-09-09 ENCOUNTER — Encounter: Payer: Self-pay | Admitting: Family Medicine

## 2020-09-09 DIAGNOSIS — D709 Neutropenia, unspecified: Secondary | ICD-10-CM | POA: Diagnosis not present

## 2020-09-09 DIAGNOSIS — R5081 Fever presenting with conditions classified elsewhere: Secondary | ICD-10-CM | POA: Diagnosis not present

## 2020-09-09 LAB — CBC WITH DIFFERENTIAL/PLATELET
Band Neutrophils: 16 %
Basophils Absolute: 0 10*3/uL (ref 0.0–0.1)
Basophils Relative: 0 %
Eosinophils Absolute: 0 10*3/uL (ref 0.0–0.5)
Eosinophils Relative: 0 %
HCT: 29.3 % — ABNORMAL LOW (ref 36.0–46.0)
Hemoglobin: 10 g/dL — ABNORMAL LOW (ref 12.0–15.0)
Lymphocytes Relative: 28 %
Lymphs Abs: 0.4 10*3/uL — ABNORMAL LOW (ref 0.7–4.0)
MCH: 31.4 pg (ref 26.0–34.0)
MCHC: 34.1 g/dL (ref 30.0–36.0)
MCV: 92.1 fL (ref 80.0–100.0)
Metamyelocytes Relative: 3 %
Monocytes Absolute: 0 10*3/uL — ABNORMAL LOW (ref 0.1–1.0)
Monocytes Relative: 1 %
Myelocytes: 1 %
Neutro Abs: 0.9 10*3/uL — ABNORMAL LOW (ref 1.7–7.7)
Neutrophils Relative %: 51 %
Platelets: 143 10*3/uL — ABNORMAL LOW (ref 150–400)
RBC: 3.18 MIL/uL — ABNORMAL LOW (ref 3.87–5.11)
RDW: 18.6 % — ABNORMAL HIGH (ref 11.5–15.5)
WBC: 1.3 10*3/uL — CL (ref 4.0–10.5)
nRBC: 0 % (ref 0.0–0.2)

## 2020-09-09 LAB — COMPREHENSIVE METABOLIC PANEL
ALT: 12 U/L (ref 0–44)
AST: 13 U/L — ABNORMAL LOW (ref 15–41)
Albumin: 2.2 g/dL — ABNORMAL LOW (ref 3.5–5.0)
Alkaline Phosphatase: 39 U/L (ref 38–126)
Anion gap: 5 (ref 5–15)
BUN: 23 mg/dL (ref 8–23)
CO2: 22 mmol/L (ref 22–32)
Calcium: 8.2 mg/dL — ABNORMAL LOW (ref 8.9–10.3)
Chloride: 110 mmol/L (ref 98–111)
Creatinine, Ser: 0.73 mg/dL (ref 0.44–1.00)
GFR, Estimated: >60 mL/min (ref >60)
Glucose, Bld: 131 mg/dL — ABNORMAL HIGH (ref 70–99)
Potassium: 3.1 mmol/L — ABNORMAL LOW (ref 3.5–5.1)
Sodium: 137 mmol/L (ref 135–145)
Total Bilirubin: 0.4 mg/dL (ref 0.3–1.2)
Total Protein: 4.8 g/dL — ABNORMAL LOW (ref 6.5–8.1)

## 2020-09-09 MED ORDER — POTASSIUM CHLORIDE CRYS ER 20 MEQ PO TBCR
40.0000 meq | EXTENDED_RELEASE_TABLET | Freq: Once | ORAL | Status: AC
  Administered 2020-09-09: 40 meq via ORAL
  Filled 2020-09-09: qty 2

## 2020-09-09 MED ORDER — LEVOFLOXACIN 750 MG PO TABS: 750.0000 mg | ORAL_TABLET | Freq: Every day | ORAL | 0 refills | Status: AC

## 2020-09-09 MED ORDER — HEPARIN SOD (PORK) LOCK FLUSH 100 UNIT/ML IV SOLN
500.0000 [IU] | Freq: Once | INTRAVENOUS | Status: AC
  Administered 2020-09-09: 500 [IU] via INTRAVENOUS
  Filled 2020-09-09: qty 5

## 2020-09-09 NOTE — Plan of Care (Signed)

## 2020-09-09 NOTE — Progress Notes (Signed)
Patient discharged today at 1345 transported home via daughter. Discharge paperwork given to daughter and patient. Daughter verbalized understanding. Patient belongings sent with daughter. Patient stable upon leaving.

## 2020-09-09 NOTE — TOC Transition Note (Signed)
Transition of Care [facility]) - CM/SW Discharge Note   Patient Details  Name: Ashley [name] MRN: [id number] Date of Birth: 1951-01-29  Transition of Care [facility]) CM/SW Contact:  Natasha Bence, LCSW Phone Number: 09/09/2020, 2:45 PM   Clinical Narrative:     CSW notified of patient's readiness for discharge. CSW contacted [facility] MD reported that patient is okay to discharge with [facility] orders for PT, RN and OT only. CSW notified Meg for verbal. TOC signing off.    Final next level of care: [place] Barriers to Discharge: Barriers Resolved   Patient Goals and CMS Choice Patient states their goals for this hospitalization and ongoing recovery are:: [facility] home with [facility] CMS Medicare.gov Compare Post Acute Care list provided to:: Patient Choice offered to / list presented to : Patient  Discharge Placement                    Patient and family notified of of transfer: 09/09/20  Discharge Plan and Services     Post Acute Care Choice: [place]: RN,PT,OT [place] Agency: [facility]        Social Determinants of Health (SDOH) Interventions     Readmission Risk Interventions Readmission Risk Prevention Plan 09/08/2020  Medication Review ([place]) Complete  PCP or Specialist appointment within 3-5 days of discharge Complete  HRI or [place] Complete  SW Recovery Care/Counseling Consult Complete  [address] Not Applicable  Some recent data might be hidden

## 2020-09-09 NOTE — Care Management Important Message (Signed)
Important Message  Patient Details  Name: Ashley [name] MRN: [id number] Date of Birth: 11-01-1950   Medicare Important Message Given:  Yes     Tommy Medal 09/09/2020, 12:03 PM

## 2020-09-09 NOTE — Discharge Summary (Signed)
Physician Discharge Summary [facility]    Patient: Ashley [name]                   Admit date: 09/06/2020   DOB: Dec 06, 1950             Discharge date:09/09/2020/11:38 AM OMV:[id number]                          PCP: Ashley Rival, NP  Disposition: HOME with Home Health   Recommendations for Outpatient Follow-up:   . Follow up: in 1 week with oncologist . Follow with PCP in 1-2 weeks  Discharge Condition: Stable   Code Status:   Code Status: Full Code  Diet recommendation: Regular healthy diet   Discharge Diagnoses:    Principal Problem:   Neutropenic fever ([place]) Active Problems:   Acute renal failure ([place])   Hypokalemia   Sepsis ([place])   Community acquired pneumonia   Acute metabolic encephalopathy   Anemia associated with chemotherapy   History of Present Illness/ Hospital Course Ashley [name] Summary:    Ashley [name] a70 y.o.female,with history of vitamin D deficiency, non-small cell lung cancer on chemotherapy, hypertension, hyperlipidemia, GERD, degenerative joint disease, coronary artery disease, CHF, and more presents the ED with a chief complaint of altered mental status.   Daughter at bedside reports that patient has been overly fatigued and confused at home. Daughter reports that patient did have chemotherapy last week, she gets chemotherapy every 3 weeks. The week after her chemo treatment, it is normal for her to be fatigued. 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 Daughter feels like her confusion has been worse and her fatigue has been worse than what is her normal. She reports that patient is normally able to recognize family, but sometimes has trouble with word finding and memory.   Daughter called Dr. Raliegh Ip who recommended going to the ER per daughter report. Daughter does report that she has had episodes of confusion like this in the past when she has had an infection.  She also reports  that the patient has not been eating or drinking normally.   When she is on her Remeron, her appetite seems much improved.  Daughter reports that at least 3 days or patient can even take 1 bite of food.   Oxygen saturations are reading low in the room, daughter reports that patient has Raynaud's and O2 sats are usually not accurate with pulse ox. Daughter reports that patient has been dehydrated due to the poor p.o. intake, and received fluid boluses last Thursday and Friday with her chemo. As far as daughter knows patient has not complained of any pains or symptoms that are a change from her normal.  Patient is a former smoker, does not drink alcohol, does not use illicit drugs.  Patient is vaccinated for COVID. Full CODE  In the ED T max 101.1, HR 90-118, RR 12-26, BP trending down at admission, currently 82/59, satting at 95% Leukopenia with a white blood cell count of 0.3, hemoglobin 7.3, hematocrit 23.4, but platelets within normal at 320 Hypokalemia at 3.3, and an AKI with a creatinine of 2.01. 8 days ago creatinine was 1.19 CT head recommends MRI to better characterize metastatic lesion that was seen previously, but not well visualized on this image Chest x-ray shows left lung base opacities EKG shows sinus tachycardia 111, QTc 487 1.3 L bolus in ED, 1 L bolus  at admission, continue LR at 150 MLS per hour Tylenol given for fever Pharmacy consulted for dosing of vancomycin and Merrem UA and UC pending Blood culture pending Admission requested for further evaluation and treatment of neutropenic fever, SEPSIS     1. Sepsis - POA with - Neutropenic fever 1. Resolved sepsis physiology-much improved  2. Afebrile normotensive 3. On admission : Met sepsis criteria (Tmax 101.1, WBC 0.3, RR 23, HR 118 4. White blood cell count 2 hours prior to presentation to ER was 1.0, white blood cell count 8 days ago was 12.1 5. Chest x-ray shows left lung base pneumonia 6. Continue  broad-spectrum antibiotics vancomycin, meropenem >>> discontinued today 09/09/2020... We will initiate p.o. Levaquin 09/09/2020 7. Blood culture >> no growth to date 8. Urine culture>>> no growth to date we will continue to monitor the patient closely on telemetry  2. Sepsis secondary to community-acquired pneumonia 1. Hemodynamic stable now, 2. Lactic acidosis, 2.5, 2.7, 2.7>>> 1.5, 1.8,  3. WBC count 0.5, 1.0 >> 1.0, 1.3 4. Endorgan damage with an AKI and the lactic acidosis >> improved 5. Status post IV fluid resuscitation aggressive per sepsis protocol, continue maintenance fluid  6. Blood cultures, urine culture >>>> no growth to date 7. Chest x-ray shows pneumonia as above 8. Started on broad-spectrum antibiotics with vancomycin and Merrem >> will be switched to p.o. antibiotics of Levaquin  3. Community-acquired pneumonia - See plan above  4. Acute metabolic encephalopathy 1. Improved mentation back to baseline 2. Multifactorial likely due to acute infection, sepsis 3. CT head recommends repeat MRI as previously seen metastatic lesion adjacent to the atrium of the right lateral ventricle is not well visualized 4. MRA head -inconclusive 5. Metabolic encephalopathy likely might be factorial-brain mets resulting in occasional confusion at baseline, polypharmacy with multiple pain medications, infection, hypotension 6. Continue to treat infection, use pain meds only as needed 5. AKI 1. Secondary to hypovolemia/prerenal 2. Boluses as above continue IV hydration 3. Creatinine 2.01, 2.05, 1.21, 0.73 6. Hypokalemia 1. IV potassium 2. Potassium 3.4, 3.3, 3.8  7. Acute on chronic anemia/neutropenia 1. Hemoglobin dropped  8.0, 7.3 >>> 5.7 >>> 9.6 2. WBC 1.0, 1.3 3. Oncologist recommending Granix- patient patient's daughter has refused the medication as has culture severe bone pain in the past 4. Likely secondary to chemotherapy 5. FOBT negative 6. Symptomatic anemia, status  posttransfusion with 2U PRBC on 09/08/2020 7. Pros and cons of blood transfusion was discussed with the patient and her daughter--consent to proceed with transfusion was given.  -------------------------------------------------------------------------------------------------------------------------------------- Nutritional status:  The patient's BMI is: Body mass index is 18.11 kg/m. I agree with the assessment and plan as outlined  Continue nutrition supplements as recommended  --------------------------------------------------------------------------------------------------------------------------------------- Cultures; Blood Cultures x 2 >> NGT Urine Culture  >>> NGT  Sputum Culture >> NGT   Antimicrobials: 09/06/2020 meropenem/vancomycin >>  09/09/2020 09/10/2020 p.o. Levaquin   Consultants: Palliative, oncology   -------------------------------------------------------------------------------------------------------------------------------------- Code Status:   Code Status: Full Code Palliative care was consulted, extensive discussion with daughter and patient  Family Communication:  Patient daughter updated at bedside The above findings and plan of care has been discussed with patient (daughter)  in detail,  they expressed understanding and agreement of above. -Advance care planning has been discussed.    Disposition  -Home     Risk of unplanned readmission Score:  High   Discharge Instructions:   Discharge Instructions    Activity as tolerated - No restrictions   Complete by: As directed  Call MD for:  difficulty breathing, headache or visual disturbances   Complete by: As directed    Call MD for:  extreme fatigue   Complete by: As directed    Call MD for:  persistant dizziness or light-headedness   Complete by: As directed    Call MD for:  persistant nausea and vomiting   Complete by: As directed    Call MD for:  temperature >100.4   Complete by:  As directed    Diet - low sodium heart healthy   Complete by: As directed    Discharge instructions   Complete by: As directed    Please continue current medications including current recommended antibiotics. Follow with oncologist closely   Increase activity slowly   Complete by: As directed        Medication List    TAKE these medications   albuterol 108 (90 Base) MCG/ACT inhaler Commonly known as: VENTOLIN HFA Inhale 2 puffs into the lungs every 6 (six) hours as needed for wheezing or shortness of breath.   albuterol (2.5 MG/3ML) 0.083% nebulizer solution Commonly known as: PROVENTIL Take 2.5 mg by nebulization every 6 (six) hours as needed for wheezing or shortness of breath.   ALPRAZolam 0.5 MG tablet Commonly known as: XANAX Take one tablet prior to MRIs and/or radiation oncology procedures.   aspirin EC 81 MG tablet Take 81 mg by mouth daily.   Calcium Carb-Cholecalciferol 600-800 MG-UNIT Tabs Take 1 tablet by mouth 2 (two) times daily.   clonazePAM 0.5 MG tablet Commonly known as: KLONOPIN Take 1 tablet (0.5 mg total) by mouth at bedtime. Take 1 and half tablets by mouth at bedtime.   cyanocobalamin 1000 MCG/ML injection Commonly known as: (VITAMIN B-12) INJECT 1 ML INTO THE MUSCLE ONCE MONTHLY AS DIRECTED. What changed: See the new instructions.   dexamethasone 2 MG tablet Commonly known as: DECADRON Take 1 tablet (2 mg total) by mouth daily. What changed: when to take this   diclofenac Sodium 1 % Gel Commonly known as: VOLTAREN Apply 2 g topically 4 (four) times daily as needed (pain).   gabapentin 600 MG tablet Commonly known as: NEURONTIN Take 1 tablet (600 mg total) by mouth daily.   guaiFENesin 600 MG 12 hr tablet Commonly known as: MUCINEX Take 1 tablet (600 mg total) by mouth 2 (two) times daily as needed for cough or to loosen phlegm. What changed: when to take this   ibandronate 150 MG tablet Commonly known as: BONIVA Take 150 mg by  mouth every 30 (thirty) days.   ibuprofen 800 MG tablet Commonly known as: ADVIL Take 1 tablet (800 mg total) by mouth every 8 (eight) hours as needed for moderate pain.   ipratropium 0.02 % nebulizer solution Commonly known as: ATROVENT Take 0.5 mg by nebulization every 6 (six) hours as needed for wheezing or shortness of breath.   ipratropium-albuterol 0.5-2.5 (3) MG/3ML Soln Commonly known as: DUONEB Take 3 mLs by nebulization every 6 (six) hours as needed (shortness of breath).   levofloxacin 750 MG tablet Commonly known as: Levaquin Take 1 tablet (750 mg total) by mouth daily for 7 days.   lidocaine-prilocaine cream Commonly known as: EMLA Apply a small amount to port a cath site and cover with plastic wrap 1 hour prior to chemotherapy appointments   magic mouthwash w/lidocaine Soln 10 cc swab and swallow What changed:   how much to take  how to take this  when to take this  reasons to take  this  additional instructions   methocarbamol 500 MG tablet Commonly known as: ROBAXIN Take 500 mg by mouth daily.   mirtazapine 15 MG tablet Commonly known as: REMERON TAKE (1) TABLET BY MOUTH AT BEDTIME. What changed: See the new instructions.   nitroGLYCERIN 0.1 mg/hr patch Commonly known as: NITRODUR - Dosed in mg/24 hr Place 0.1 mg onto the skin daily.   ondansetron 8 MG disintegrating tablet Commonly known as: Zofran ODT Take 1 tablet (8 mg total) by mouth every 8 (eight) hours as needed for nausea or vomiting. What changed:   when to take this  additional instructions   oxyCODONE 5 MG immediate release tablet Commonly known as: Oxy IR/ROXICODONE Take 1 tablet (5 mg total) by mouth every 8 (eight) hours.   OXYGEN Inhale 3 L into the lungs daily as needed (SOB).   pantoprazole 40 MG tablet Commonly known as: PROTONIX TAKE ONE TABLET BY MOUTH TWICE DAILY.   polyethylene glycol 17 g packet Commonly known as: MIRALAX / GLYCOLAX Take 17 g by mouth daily  as needed for mild constipation.   potassium chloride SA 20 MEQ tablet Commonly known as: KLOR-CON Take 1 tablet (20 mEq total) by mouth 3 (three) times daily.   prochlorperazine 10 MG tablet Commonly known as: COMPAZINE 1 tablet as needed What changed:   how much to take  how to take this  when to take this  additional instructions   rizatriptan 10 MG tablet Commonly known as: MAXALT Take 10 mg by mouth 2 (two) times daily. 1-2 times daily May repeat in 2 hours if needed   rosuvastatin 40 MG tablet Commonly known as: CRESTOR Take 40 mg by mouth at bedtime.   sucralfate 1 g tablet Commonly known as: CARAFATE Take 1 g by mouth 3 (three) times daily as needed (upset stomach).   traMADol 50 MG tablet Commonly known as: ULTRAM Take 50 mg by mouth daily.   Vitamin D-3 25 MCG (1000 UT) Caps Take 1,000 Units daily by mouth.       Follow-up Information    [facility] Follow up.   Specialty: Home Health Services Why: Follow up with Contact information: [address] [place] [address] [phone]              Allergies  Allergen Reactions  . Penicillins Hives and Rash    Did it involve swelling of the face/tongue/throat, SOB, or low BP? No Did it involve sudden or severe rash/hives, skin peeling, or any reaction on the inside of your mouth or nose? No Did you need to seek medical attention at a hospital or doctor's office? No When did it last happen?5-10 year If all above answers are "NO", may proceed with cephalosporin use.       Procedures /Studies:   CT Head Wo Contrast  Result Date: 09/06/2020 CLINICAL DATA:  History of small cell lung cancer. Brain metastases. EXAM: CT HEAD WITHOUT CONTRAST TECHNIQUE: Contiguous axial images were obtained from the base of the skull through the vertex without intravenous contrast. COMPARISON:  October 01, 2017.  June 13, 2020. FINDINGS: Brain: Mild chronic ischemic white matter disease is  noted. Metastatic lesion seen adjacent to the atrium of the right lateral ventricle on prior MRI is not well visualized on this unenhanced study. No definite mass effect or midline shift is noted. No hemorrhage or acute infarction is noted. Ventricular size is within normal limits. Vascular: No hyperdense vessel or unexpected calcification. Skull: Normal. Negative for fracture  or focal lesion. Sinuses/Orbits: No acute finding. Other: Fluid is noted in the right mastoid air cells. IMPRESSION: Metastatic lesion seen adjacent to the atrium of right lateral ventricle on prior MRI of June 13, 2020 is not well visualized on this unenhanced study. Repeat MRI is recommended for evaluation of this lesion. No other significant intracranial abnormality is noted. Electronically Signed   By: Marijo Conception M.D.   On: 09/06/2020 16:23   MR ANGIO HEAD WO CONTRAST  Result Date: 09/07/2020 CLINICAL DATA:  Metastatic cancer. EXAM: MRA HEAD WITHOUT CONTRAST TECHNIQUE: Angiographic images of the Circle of Willis were acquired using MRA technique without intravenous contrast. COMPARISON:  MRI head same day.  CT head yesterday. FINDINGS: Anterior circulation: Both internal carotid arteries are patent through the skull base and siphon regions. The anterior and middle cerebral vessels show flow without evidence of proximal stenosis, aneurysm or vascular malformation. Posterior circulation: Both vertebral arteries are patent to the basilar. The right is dominant. No basilar stenosis. Posterior circulation branch vessels show flow. Anatomic variants: None significant. Other: None IMPRESSION: Some motion degradation, but otherwise negative intracranial MR angiography of the large and medium sized vessels. Was the intention to order a contrast enhanced brain MRI for evaluation of the presumed intracranial metastatic lesions? Electronically Signed   By: Nelson Chimes M.D.   On: 09/07/2020 12:14   MR BRAIN WO CONTRAST  Result Date:  09/07/2020 CLINICAL DATA:  Altered mental status. Confusion. Metastatic lung cancer. EXAM: MRI HEAD WITHOUT CONTRAST TECHNIQUE: Multiplanar, multiecho pulse sequences of the brain and surrounding structures were obtained without intravenous contrast. COMPARISON:  Head CT 09/06/2020.  MRI 06/13/2020. FINDINGS: Brain: This examination was ordered without contrast, which hinders evaluation of metastatic brain disease. However, in this case, I think that small foci of restricted diffusion likely represent what would be enhancing brain lesions in will be enumerated below. No focal abnormality affects the brainstem or cerebellum. Previously seen treated lesion adjacent to the occipital horn of the right lateral ventricle with some regional edema has involuted considerably, consistent with successful treatment. Newly seen foci of restricted diffusion which are felt more likely represent metastatic lesions than multiple embolic infarctions are present as follows. Three punctate foci in the left occipital lobe diffusion images 15 through 17. 3 mm focus along the lateral margin of the occipital horn of the left lateral ventricle diffusion image 20. Punctate focus along the lateral margin of the occipital horn of the right lateral ventricle, possibly just above the previously treated lesion, image 23. 5 x 6 mm focus adjacent to the medial margin of the atrium of the left lateral ventricle image 25. 5 mm focus within the left anterior superior temporal lobe with mild regional edema, image 26. Other small foci along the append mole margins of the lateral ventricle in numerous locations, most notable at the posterior body of the left lateral ventricle image 30. 3 mm focus in the white matter of the left posterior frontal lobe image 33. We may, in this case, be dealing with a combination of new hematogenous metastatic lesions as well as potential ependymal spread of disease due to the previous ependymal involvement in the  occipital horn of the right lateral ventricle. At some point, postcontrast imaging will help evaluate these lesions more completely. Vascular: Major vessels at the base of the brain show flow. Skull and upper cervical spine: No calvarial lesion. Previous C3 findings not well evaluated. Sinuses/Orbits: Paranasal sinuses are clear. Orbits are negative. Small left mastoid  effusion extensive right mastoid effusion as seen previously. Other: None IMPRESSION: This study was ordered and performed without contrast. Favorable evolutionary changes of the previously treated lesion adjacent to the occipital horn of the right lateral ventricle. Resolution of edema in that area. New foci of restricted diffusion that I think represent metastatic lesions more likely than embolic infarctions, though there could always be a combination of both processes. These include parenchymal foci in the left occipital lobe, left anterior superior temporal lobe and left posterior frontal white matter. 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 At some point, postcontrast imaging would be useful to better understand the findings. Electronically Signed   By: Nelson Chimes M.D.   On: 09/07/2020 08:07   NM PET Image Restag (PS) Skull Base To Thigh  Result Date: 08/16/2020 CLINICAL DATA:  Subsequent treatment strategy for small cell lung cancer. EXAM: NUCLEAR MEDICINE PET SKULL BASE TO THIGH TECHNIQUE: 5.4 mCi F-18 FDG was injected intravenously. Full-ring PET imaging was performed from the skull base to thigh after the radiotracer. CT data was obtained and used for attenuation correction and anatomic localization. Fasting blood glucose: 96 mg/dl COMPARISON:  PET-CT dated 05/26/2020 FINDINGS: Mediastinal blood pool activity: SUV max 2.9 Liver activity: SUV max NA NECK: No hypermetabolic cervical lymphadenopathy. Incidental CT findings: none  CHEST: 2.0 cm short axis subcarinal node (series 4/image 70), max SUV 5.2, previously 2.7 cm with max SUV 8.7. 13 mm short axis right perihilar node (series 4/image 32), max SUV 6.8, previously 18 mm with max SUV 8.9. Status post right upper lobectomy. Prior hypermetabolism in the right suprahilar region along the staple line has resolved. Scarring with subpleural reticulation in the right lung. Moderate centrilobular and paraseptal emphysematous changes. No suspicious pulmonary nodules. Left mastectomy with reconstruction and left axillary lymph node dissection. Left chest port terminates in the lower SVC. Incidental CT findings: Atherosclerotic calcifications of the aortic arch. Hypodense blood pool relative to myocardium, suggesting anemia. Mild coronary atherosclerosis of the LAD and left circumflex. Mild anterior pericardial fluid. ABDOMEN/PELVIS: No abnormal hypermetabolism in the liver, spleen, pancreas, or adrenal glands. No hypermetabolic abdominopelvic lymphadenopathy. Incidental CT findings: Stable 2.6 cm right adrenal adenoma. Bilateral renal cysts. Atherosclerotic calcifications the abdominal aorta and branch vessels. Status post hysterectomy with suspected pelvic floor laxity. SKELETON: No focal hypermetabolic activity to suggest skeletal metastasis. Incidental CT findings: Cervical and lumbar spine fixation hardware. Degenerative changes of the visualized thoracolumbar spine. IMPRESSION: Status post right upper lobectomy. Mediastinal and right perihilar nodal metastases, improved. Status post left mastectomy with left axillary lymph node dissection. Additional stable ancillary findings as above. Electronically Signed   By: Julian Hy M.D.   On: 08/16/2020 11:09   DG Chest Port 1 View  Result Date: 09/06/2020 CLINICAL DATA:  Questionable sepsis - evaluate for abnormality Confusion. EXAM: PORTABLE CHEST 1 VIEW COMPARISON:  Radiograph 08/21/2019.  CT 09/15/2019 FINDINGS: Accessed left chest  port in place. Postsurgical volume loss in the right hemithorax with right tracheal deviation, unchanged. Upper normal heart size with aortic atherosclerosis and tortuosity. There is scarring in the periphery of the right mid lung, as well as apical pleuroparenchymal thickening. Patchy airspace opacities at the left lung base are new from prior exam. No pneumothorax or pleural effusion. Bones under mineralized. Surgical and lumbar fusion hardware. IMPRESSION: 1. Patchy airspace opacities at the left lung base, new from prior exam, suspicious for pneumonia. Recommend radiologic follow-up. 2. Stable  postsurgical volume loss and scarring in the right hemithorax. Electronically Signed   By: Keith Rake M.D.   On: 09/06/2020 15:53    Subjective:   Patient was seen and examined 09/09/2020, 11:38 AM Patient stable today. No acute distress.  No issues overnight Stable for discharge.  Discharge Exam:    Vitals:   09/08/20 1400 09/08/20 1937 09/08/20 2023 09/09/20 0254  BP: 138/82 (!) 141/98  112/86  Pulse: 68 64  60  Resp: _0 Temp: 97.8 F (36.6 C) 98 F (36.7 C)  98.6 F (37 C)  TempSrc: Oral   Oral  SpO2: 97% 95% 98% 96%  Weight:      Height:        General: Pt lying comfortably in bed & appears in no obvious distress. Cardiovascular: S1 & S2 heard, RRR, S1/S2 +. No murmurs, rubs, gallops or clicks. No JVD or pedal edema. Respiratory: Clear to auscultation without wheezing, rhonchi or crackles. No increased work of breathing. Abdominal:  Non-distended, non-tender & soft. No organomegaly or masses appreciated. Normal bowel sounds heard. CNS: Alert and oriented. No focal deficits. Extremities: no edema, no cyanosis      The results of significant diagnostics from this hospitalization (including imaging, microbiology, ancillary and laboratory) are listed below for reference.      Microbiology:   Recent Results (from the past 240 hour(s))  Blood culture (routine single)      Status: None (Preliminary result)   Collection Time: 09/06/20  4:25 PM   Specimen: BLOOD LEFT ARM  Result Value Ref Range Status   Specimen Description BLOOD LEFT ARM  Final   Special Requests   Final    BOTTLES DRAWN AEROBIC AND ANAEROBIC Blood Culture adequate volume   Culture   Final    NO GROWTH 3 DAYS Performed at [facility], [address] [place], [place] [address]    Report Status PENDING  Incomplete  Urine culture     Status: None   Collection Time: 09/06/20  5:26 PM   Specimen: In/Out Cath Urine  Result Value Ref Range Status   Specimen Description   Final    IN/OUT CATH URINE Performed at [facility], [address] [place], [place] [address]    Special Requests   Final    Immunocompromised Performed at [facility], [address] [place], [place] [address]    Culture   Final    NO GROWTH Performed at [facility], [place] [address], [place],  [address]    Report Status 09/08/2020 FINAL  Final  Resp Panel by RT-PCR (Flu A&B, Covid) Nasopharyngeal Swab     Status: None   Collection Time: 09/06/20  6:44 PM   Specimen: Nasopharyngeal Swab; Nasopharyngeal(NP) swabs in vial transport medium  Result Value Ref Range Status   SARS Coronavirus 2 by RT PCR NEGATIVE NEGATIVE Final    Comment: (NOTE) SARS-CoV-2 target nucleic acids are NOT DETECTED.  The SARS-CoV-2 RNA is generally detectable in upper respiratory specimens during the acute phase of infection. The lowest concentration of SARS-CoV-2 viral copies this assay can detect is 138 copies/mL. A negative result does not preclude SARS-Cov-2 infection and should not be used as the sole basis for treatment or other patient management decisions. A negative result may occur with  improper specimen collection/handling, submission of specimen other than nasopharyngeal swab, presence of viral mutation(s) within the areas targeted by this assay, and inadequate number of viral copies(<138 copies/mL). A  negative  result must be combined with clinical observations, patient history, and epidemiological information. The expected result is Negative.  Fact Sheet for Patients:  EntrepreneurPulse.com.au  Fact Sheet for Healthcare Providers:  IncredibleEmployment.be  This test is no t yet approved or cleared by the [place] FDA and  has been authorized for detection and/or diagnosis of SARS-CoV-2 by FDA under an Emergency Use Authorization (EUA). This EUA will remain  in effect (meaning this test can be used) for the duration of the COVID-19 declaration under Section 564(b)(1) of the Act, 21 U.S.C.section 360bbb-3(b)(1), unless the authorization is terminated  or revoked sooner.       Influenza A by PCR NEGATIVE NEGATIVE Final   Influenza B by PCR NEGATIVE NEGATIVE Final    Comment: (NOTE) The Xpert Xpress SARS-CoV-2/FLU/RSV plus assay is intended as an aid in the diagnosis of influenza from Nasopharyngeal swab specimens and should not be used as a sole basis for treatment. Nasal washings and aspirates are unacceptable for Xpert Xpress SARS-CoV-2/FLU/RSV testing.  Fact Sheet for Patients: EntrepreneurPulse.com.au  Fact Sheet for Healthcare Providers: IncredibleEmployment.be  This test is not yet approved or cleared by the [place] FDA and has been authorized for detection and/or diagnosis of SARS-CoV-2 by FDA under an Emergency Use Authorization (EUA). This EUA will remain in effect (meaning this test can be used) for the duration of the COVID-19 declaration under Section 564(b)(1) of the Act, 21 U.S.C. section 360bbb-3(b)(1), unless the authorization is terminated or revoked.  Performed at [facility], [address], [place], [place] [address]      Labs:   CBC: Recent Labs  Lab 09/06/20 1424 09/06/20 1625 09/07/20 0233 09/07/20 0356 09/07/20 0812 09/07/20 2221 09/09/20 0504  WBC 1.0*  0.3*  --  0.5* 1.0*  --  1.3*  NEUTROABS 0.1* 0.1*  --  0.3* 0.5*  --  0.9*  HGB 8.0* 7.3* 5.8* 5.7* 6.7* 9.6* 10.0*  HCT 25.5* 23.4* 18.5* 18.1* 21.4* 28.8* 29.3*  MCV 98.5 98.3  --  98.9 100.0  --  92.1  PLT 419* 320  --  186 239  --  790*   Basic Metabolic Panel: Recent Labs  Lab 09/06/20 1424 09/06/20 1625 09/07/20 0356 09/09/20 0504  NA 136 137 136 137  K 3.4* 3.3* 3.8 3.1*  CL 102 104 108 110  CO2 _0 GLUCOSE 132* 124* 115* 131*  BUN 51* 52* 38* 23  CREATININE 2.01* 2.05* 1.21* 0.73  CALCIUM 8.6* 8.4* 7.6* 8.2*  MG 1.8  --  1.6*  --    Liver Function Tests: Recent Labs  Lab 09/06/20 1424 09/06/20 1625 09/07/20 0356 09/09/20 0504  AST 18 19 12* 13*  ALT _1 ALKPHOS 48 46 37* 39  BILITOT 0.8 0.5 0.5 0.4  PROT 6.5 6.3* 5.0* 4.8*  ALBUMIN 3.5 3.3* 2.5* 2.2*   BNP (last 3 results) No results for input(s): BNP in the last 8760 hours. Cardiac Enzymes: No results for input(s): CKTOTAL, CKMB, CKMBINDEX, TROPONINI in the last 168 hours. CBG: No results for input(s): GLUCAP in the last 168 hours. Hgb A1c No results for input(s): HGBA1C in the last 72 hours. Lipid Profile No results for input(s): CHOL, HDL, LDLCALC, TRIG, CHOLHDL, LDLDIRECT in the last 72 hours. Thyroid function studies No results for input(s): TSH, T4TOTAL, T3FREE, THYROIDAB in the last 72 hours.  Invalid input(s): FREET3 Anemia work up No results for input(s): VITAMINB12, FOLATE, FERRITIN, TIBC, IRON, RETICCTPCT in the last 63  hours. Urinalysis    Component Value Date/Time   COLORURINE AMBER (A) 09/06/2020 1726   APPEARANCEUR HAZY (A) 09/06/2020 1726   LABSPEC 1.019 09/06/2020 1726   PHURINE 5.0 09/06/2020 1726   GLUCOSEU NEGATIVE 09/06/2020 1726   HGBUR NEGATIVE 09/06/2020 1726   BILIRUBINUR NEGATIVE 09/06/2020 1726   KETONESUR NEGATIVE 09/06/2020 1726   PROTEINUR 30 (A) 09/06/2020 1726   NITRITE NEGATIVE 09/06/2020 1726   LEUKOCYTESUR NEGATIVE 09/06/2020 1726          Time coordinating discharge: Over 45 minutes  SIGNED: Deatra James, MD, FACP, [facility]. [facility],  Please use [email or web address] to Page If 7PM-7AM, please contact night-coverage [email or web address] Ota [facility] 09/09/2020, 11:38 AM

## 2020-09-12 LAB — CULTURE, BLOOD (SINGLE)
Culture: NO GROWTH
Special Requests: ADEQUATE

## 2020-09-15 ENCOUNTER — Ambulatory Visit
Admission: RE | Admit: 2020-09-15 | Discharge: 2020-09-15 | Disposition: A | Discharge status: Home / Self Care | Payer: Medicare Other | Source: Ambulatory Visit | Attending: Radiation Oncology | Admitting: Radiation Oncology

## 2020-09-15 ENCOUNTER — Other Ambulatory Visit: Payer: Self-pay

## 2020-09-15 ENCOUNTER — Encounter (HOSPITAL_COMMUNITY): Payer: Self-pay

## 2020-09-15 DIAGNOSIS — C7949 Secondary malignant neoplasm of other parts of nervous system: Secondary | ICD-10-CM

## 2020-09-15 MED ORDER — GADOBENATE DIMEGLUMINE 529 MG/ML IV SOLN
9.0000 mL | Freq: Once | INTRAVENOUS | Status: AC | PRN
  Administered 2020-09-15: 9 mL via INTRAVENOUS

## 2020-09-15 MED ORDER — HEPARIN SOD (PORK) LOCK FLUSH 100 UNIT/ML IV SOLN
500.0000 [IU] | Freq: Once | INTRAVENOUS | Status: AC
  Administered 2020-09-15: 500 [IU] via INTRAVENOUS

## 2020-09-15 MED ORDER — SODIUM CHLORIDE 0.9% FLUSH
10.0000 mL | INTRAVENOUS | Status: DC | PRN
  Administered 2020-09-15: 10 mL via INTRAVENOUS

## 2020-09-15 NOTE — Progress Notes (Signed)
Call received from patient's daughter at around noon stating that the patient has unilateral left arm swelling that has progressively gotten worse. She reports that the left arm is weeping. Declines taking patient to Emergency Dept. No provider available in the clinic today, this was explained to the patient's daughter. Encouraged her to reach out to PCP or take the patient to nearest Urgent Care for workup.

## 2020-09-19 ENCOUNTER — Inpatient Hospital Stay: Payer: Medicare Other | Attending: Radiation Oncology

## 2020-09-19 ENCOUNTER — Other Ambulatory Visit (HOSPITAL_COMMUNITY): Payer: Medicare Other

## 2020-09-19 ENCOUNTER — Ambulatory Visit (HOSPITAL_COMMUNITY): Payer: Medicare Other

## 2020-09-19 ENCOUNTER — Ambulatory Visit (HOSPITAL_COMMUNITY): Payer: Medicare Other | Admitting: Hematology

## 2020-09-19 ENCOUNTER — Inpatient Hospital Stay (HOSPITAL_BASED_OUTPATIENT_CLINIC_OR_DEPARTMENT_OTHER): Payer: Medicare Other | Admitting: Internal Medicine

## 2020-09-19 ENCOUNTER — Ambulatory Visit (HOSPITAL_COMMUNITY): Payer: Medicare Other | Admitting: Dietician

## 2020-09-19 DIAGNOSIS — C7931 Secondary malignant neoplasm of brain: Secondary | ICD-10-CM

## 2020-09-19 NOTE — Progress Notes (Signed)
I connected with Ashley [name] on 09/19/20 at  2:30 PM EDT by telephone visit and verified that I am speaking with the correct person using two identifiers.  I discussed the limitations, risks, security and privacy concerns of performing an evaluation and management service by telemedicine and the availability of in-person appointments. I also discussed with the patient that there may be a patient responsible charge related to this service. The patient expressed understanding and agreed to proceed.  Other persons participating in the visit and their role in the encounter:  daughter  Patient's location:  Home  Provider's location:  Office  Chief Complaint:  Brain metastasis [facility])  History of Present Ilness: EMELEE RODOCKER describes difficulties with balance, walking.  This has not led to any falls, but independence of gait has been declining in recent weeks.  Otherwise denies any new neurologic deficits, she remains mainly functionally independent.  Continues on topotecan with Dr. Delton Coombes.   Observations: Language intact with regards to comprehension, expression  Imaging:  [facility] Interpretation: I have personally reviewed the CNS images as listed.  My interpretation, in the context of the patient's clinical presentation, is progressive disease  CT Head Wo Contrast  Result Date: 09/06/2020 CLINICAL DATA:  History of small cell lung cancer. Brain metastases. EXAM: CT HEAD WITHOUT CONTRAST TECHNIQUE: Contiguous axial images were obtained from the base of the skull through the vertex without intravenous contrast. COMPARISON:  October 01, 2017.  June 13, 2020. FINDINGS: Brain: Mild chronic ischemic white matter disease is noted. Metastatic lesion seen adjacent to the atrium of the right lateral ventricle on prior MRI is not well visualized on this unenhanced study. No definite mass effect or midline shift is noted. No hemorrhage or acute infarction is noted. Ventricular size is within normal limits.  Vascular: No hyperdense vessel or unexpected calcification. Skull: Normal. Negative for fracture or focal lesion. Sinuses/Orbits: No acute finding. Other: Fluid is noted in the right mastoid air cells. IMPRESSION: Metastatic lesion seen adjacent to the atrium of right lateral ventricle on prior MRI of June 13, 2020 is not well visualized on this unenhanced study. Repeat MRI is recommended for evaluation of this lesion. No other significant intracranial abnormality is noted. Electronically Signed   By: Marijo Conception M.D.   On: 09/06/2020 16:23   MR ANGIO HEAD WO CONTRAST  Result Date: 09/07/2020 CLINICAL DATA:  Metastatic cancer. EXAM: MRA HEAD WITHOUT CONTRAST TECHNIQUE: Angiographic images of the Circle of Willis were acquired using MRA technique without intravenous contrast. COMPARISON:  MRI head same day.  CT head yesterday. FINDINGS: Anterior circulation: Both internal carotid arteries are patent through the skull base and siphon regions. The anterior and middle cerebral vessels show flow without evidence of proximal stenosis, aneurysm or vascular malformation. Posterior circulation: Both vertebral arteries are patent to the basilar. The right is dominant. No basilar stenosis. Posterior circulation branch vessels show flow. Anatomic variants: None significant. Other: None IMPRESSION: Some motion degradation, but otherwise negative intracranial MR angiography of the large and medium sized vessels. Was the intention to order a contrast enhanced brain MRI for evaluation of the presumed intracranial metastatic lesions? Electronically Signed   By: Nelson Chimes M.D.   On: 09/07/2020 12:14   MR BRAIN WO CONTRAST  Result Date: 09/07/2020 CLINICAL DATA:  Altered mental status. Confusion. Metastatic lung cancer. EXAM: MRI HEAD WITHOUT CONTRAST TECHNIQUE: Multiplanar, multiecho pulse sequences of the brain and surrounding structures were obtained without intravenous contrast. COMPARISON:  Head CT 09/06/2020.  MRI 06/13/2020. FINDINGS: Brain: This examination was ordered without contrast, which hinders evaluation of metastatic brain disease. However, in this case, I think that small foci of restricted diffusion likely represent what would be enhancing brain lesions in will be enumerated below. No focal abnormality affects the brainstem or cerebellum. Previously seen treated lesion adjacent to the occipital horn of the right lateral ventricle with some regional edema has involuted considerably, consistent with successful treatment. Newly seen foci of restricted diffusion which are felt more likely represent metastatic lesions than multiple embolic infarctions are present as follows. Three punctate foci in the left occipital lobe diffusion images 15 through 17. 3 mm focus along the lateral margin of the occipital horn of the left lateral ventricle diffusion image 20. Punctate focus along the lateral margin of the occipital horn of the right lateral ventricle, possibly just above the previously treated lesion, image 23. 5 x 6 mm focus adjacent to the medial margin of the atrium of the left lateral ventricle image 25. 5 mm focus within the left anterior superior temporal lobe with mild regional edema, image 26. Other small foci along the append mole margins of the lateral ventricle in numerous locations, most notable at the posterior body of the left lateral ventricle image 30. 3 mm focus in the white matter of the left posterior frontal lobe image 33. We may, in this case, be dealing with a combination of new hematogenous metastatic lesions as well as potential ependymal spread of disease due to the previous ependymal involvement in the occipital horn of the right lateral ventricle. At some point, postcontrast imaging will help evaluate these lesions more completely. Vascular: Major vessels at the base of the brain show flow. Skull and upper cervical spine: No calvarial lesion. Previous C3 findings not well evaluated.  Sinuses/Orbits: Paranasal sinuses are clear. Orbits are negative. Small left mastoid effusion extensive right mastoid effusion as seen previously. Other: None IMPRESSION: This study was ordered and performed without contrast. Favorable evolutionary changes of the previously treated lesion adjacent to the occipital horn of the right lateral ventricle. Resolution of edema in that area. New foci of restricted diffusion that I think represent metastatic lesions more likely than embolic infarctions, though there could always be a combination of both processes. These include parenchymal foci in the left occipital lobe, left anterior superior temporal lobe and left posterior frontal white matter. 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 At some point, postcontrast imaging would be useful to better understand the findings. Electronically Signed   By: Nelson Chimes M.D.   On: 09/07/2020 08:07   MR Brain W Wo Contrast  Result Date: 09/15/2020 CLINICAL DATA:  Brain/CNS neoplasm, surveillance. EXAM: MRI HEAD WITHOUT AND WITH CONTRAST TECHNIQUE: Multiplanar, multiecho pulse sequences of the brain and surrounding structures were obtained without and with intravenous contrast. CONTRAST:  73mL MULTIHANCE GADOBENATE DIMEGLUMINE 529 MG/ML IV SOLN COMPARISON:  MRI without contrast on Sep 07, 2020 and MRI with contrast on June 13, 2020. FINDINGS: Brain: Numerous new small areas of subependymal enhancement along the lateral ventricles bilaterally. For example, there are multiple nodular areas along the posterior aspect of the left lateral ventricle inferiorly (for example see series 12, image 65), along the inferior/anterolateral ventricles (see series 12, image 87), more superiorly on the right (series 12, image 96), along the medial aspect of the atrium of the left (series 12, image 84), and along the superior right  lateral ventricle (series 12, image 108). The treated lesion along the occipital horn of the right lateral ventricle is decreased in size with linear subependymal enhancement seen (series 12, image 96). New enhancing cortical lesions in the left insula (5 mm on series 12, image 77), the left temporal lobe (8 mm on series 12, image 94), and in the high right parasagittal frontal lobe (series 12, image 117). Tiny foci of enhancement in the left occipital lobe in the region of previously seen restricted diffusion (series 12, images 49, 54) and tiny focus of restricted diffusion in the high left frontal white matter (series 3, image 70) without clear enhancement. Send clear fever represent small early metastases or infarcts. Recommend continued attention on short interval follow-up. Similar atrophy with ex vacuo ventricular dilation. No hydrocephalus. No substantial mass effect. No extra-axial fluid collection. Vascular: Major arterial flow voids are maintained at the skull base. Skull and upper cervical spine: Normal marrow signal. Sinuses/Orbits: Clear sinuses. Other: Large right and small left mastoid effusions IMPRESSION: 1. In comparison to prior contrast MRI from May 24, 2020, numerous new small subependymal areas of enhancement along the lateral ventricles bilaterally and enhancing metastases in the left insular, left temporal, and right frontal cortex, compatible with disease progression and ependymal spread. The treated lesion along the occipital horn of the right lateral ventricle is decreased in size. 2. Tiny foci of enhancement in the left occipital lobe in the region of previously seen restricted diffusion on recent May 25th MRI and similar tiny focus of restricted diffusion in the high left frontal white matter without clear enhancement. Given their small size, it is difficult to determine if these are additional small metastases versus evolving infarcts. Recommend continued attention on short interval  follow-up. 3. Large right and small left mastoid effusions. Electronically Signed   By: Margaretha Sheffield MD   On: 09/15/2020 14:07   DG Chest Port 1 View  Result Date: 09/06/2020 CLINICAL DATA:  Questionable sepsis - evaluate for abnormality Confusion. EXAM: PORTABLE CHEST 1 VIEW COMPARISON:  Radiograph 08/21/2019.  CT 09/15/2019 FINDINGS: Accessed left chest port in place. Postsurgical volume loss in the right hemithorax with right tracheal deviation, unchanged. Upper normal heart size with aortic atherosclerosis and tortuosity. There is scarring in the periphery of the right mid lung, as well as apical pleuroparenchymal thickening. Patchy airspace opacities at the left lung base are new from prior exam. No pneumothorax or pleural effusion. Bones under mineralized. Surgical and lumbar fusion hardware. IMPRESSION: 1. Patchy airspace opacities at the left lung base, new from prior exam, suspicious for pneumonia. Recommend radiologic follow-up. 2. Stable postsurgical volume loss and scarring in the right hemithorax. Electronically Signed   By: Keith Rake M.D.   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  She is ~1 year from PCI and several months removed from R temporal radiosurgery.  She understands this condition is not curable and may behave aggressively.  We discussed two options moving forward: -WBRT with hippocampal sparing, which could prolong life but would compromise quality of life given RT pretreatment burden -Referral to hospice  She will take time over the next day or two to discuss with her family and get back to [place] on a preferred pathway forward.  Follow Up Instructions: RTC as needed or   I discussed the assessment and treatment plan with  the patient.  The patient was provided an  opportunity to ask questions and all were answered.  The patient agreed with the plan and demonstrated understanding of the instructions.    The patient was advised to call back or seek an in-person evaluation if the symptoms worsen or if the condition fails to improve as anticipated.  I provided 5-10 minutes of non-face-to-face time during this enocunter.  Ventura Sellers, MD   I provided 25 minutes of non face-to-face telephone visit time during this encounter, and > 50% was spent counseling as documented under my assessment & plan.

## 2020-09-19 NOTE — Progress Notes (Signed)
Nutrition  Patient did not answer for scheduled nutrition follow-up via telephone. Voice mailbox was full, unable to leave message. Received return phone call from patient this afternoon. She reports doing alright since returning home from recent hospital admission (5/24-5/27) due to sepsis PNA. She reports ongoing poor appetite, she is taking mirtazapine. Patient continues to take zofran for nausea, she had to take 2 this morning. Patient ate some oatmeal for breakfast, she has not eaten anything else today at 3:00 PM. Daughter reports patient's mouth is "on fire" and having metallic taste. Daughter reports patient has magic mouthwash.   Medications: reviewed  Labs: reviewed  Anthropometrics: Last weight 99 lb on 5/24 stable with 99 lb 6.4 oz on 5/16 decreased from 103 lb 2 oz on 5/6  4/25- 104 lb 4.4 oz 4/4 - 103 lb 9.6 oz 3/14 - 99 lb 6.4 oz    NUTRITION DIAGNOSIS: Inadequate oral intake ongoing   INTERVENTION:  Discussed strategies for sore mouth, encouraged room temperature/cold foods, drinking through straw, using baking soda/mouth rinse several times/day, recipe given - will mail handout Discussed strategies for altered taste, will mail handout  Encouraged pt to drink high calorie shakes, recipes have been provided Encouraged eating soft moist high protein foods, will mail handout    MONITORING, EVALUATION, GOAL: weight trends, intake   NEXT VISIT: Thursday June 16 in infusion

## 2020-09-20 ENCOUNTER — Other Ambulatory Visit (HOSPITAL_COMMUNITY): Payer: Self-pay | Admitting: Hematology

## 2020-09-20 ENCOUNTER — Ambulatory Visit (HOSPITAL_COMMUNITY): Payer: Medicare Other

## 2020-09-20 DIAGNOSIS — C3491 Malignant neoplasm of unspecified part of right bronchus or lung: Secondary | ICD-10-CM

## 2020-09-20 MED ORDER — OXYCODONE HCL 5 MG PO TABS: 5.0000 mg | ORAL_TABLET | Freq: Three times a day (TID) | ORAL | 0 refills | Status: DC

## 2020-09-21 ENCOUNTER — Ambulatory Visit (HOSPITAL_COMMUNITY): Payer: Medicare Other

## 2020-09-22 ENCOUNTER — Other Ambulatory Visit: Payer: Medicare Other

## 2020-09-22 ENCOUNTER — Ambulatory Visit (HOSPITAL_COMMUNITY): Payer: Medicare Other

## 2020-09-22 NOTE — Progress Notes (Signed)
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 Right upper lobectomy on 12/30/2018. 2. Radiation to whole brain 25 Gy in 10 fractions from 01/28/2019 through 02/10/2019. 3. Carboplatin and etoposide x 4 cycles from 03/09/2019 through 05/11/2019. 4. Lurbinectedin x 7 cycles from 12/15/2019 to 05/05/2020. 5. SBRT to brain metastases in 3 fractions from 06/22/2020 to 06/27/2020.  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 The 3 liver lesions are not hypermetabolic. The radiologist suspects they may be subtly present on prior CT chest from 10/2013, to further reassuring that these are likely benign lesions.    Melanoma ([place])  07/09/2013 Initial Biopsy   Initial biopsy L upper thigh/buttocks melanoma    07/20/2013 Surgery   Excision L lateral buttock/upper thigh melanoma, clear margins    07/20/2013 Pathology Results   Breslow depth 1.02 mm, pT2a     Small cell lung carcinoma, right ([place])  11/27/2018 Initial Diagnosis   Small cell lung carcinoma, right ([facility])    03/09/2019 - 05/15/2019 Chemotherapy   The patient had palonosetron (ALOXI) injection 0.25 mg, 0.25 mg, Intravenous,  Once, 4 of 4  cycles Administration: 0.25 mg (03/09/2019), 0.25 mg (03/30/2019), 0.25 mg (04/20/2019), 0.25 mg (05/11/2019) pegfilgrastim (NEULASTA ONPRO KIT) injection 6 mg, 6 mg, Subcutaneous, Once, 1 of 1 cycle Administration: 6 mg (03/11/2019) pegfilgrastim-jmdb (FULPHILA) injection 6 mg, 6 mg, Subcutaneous,  Once, 3 of 3 cycles Administration: 6 mg (04/03/2019), 6 mg (04/24/2019), 6 mg (05/15/2019) CARBOplatin (PARAPLATIN) 330 mg in sodium chloride 0.9 % 250 mL chemo infusion, 330 mg (100 % of original dose 325.5 mg), Intravenous,  Once, 4 of 4 cycles Dose modification:   (original dose 325.5 mg, Cycle 1),   (original dose 325.5 mg, Cycle 2),   (original dose 309 mg, Cycle 3),   (original dose 325.5 mg, Cycle 4) Administration: 330 mg (03/09/2019), 330 mg (03/30/2019), 310 mg (04/20/2019), 330 mg (05/11/2019) etoposide (VEPESID) 150 mg in sodium chloride 0.9 % 500 mL chemo infusion, 100 mg/m2 = 150 mg, Intravenous,  Once, 4 of 4 cycles Administration: 150 mg (03/09/2019), 150 mg (03/10/2019), 150 mg (03/11/2019), 150 mg (03/30/2019), 150 mg (03/31/2019), 150 mg (04/01/2019), 150 mg (04/20/2019), 150 mg (04/21/2019), 150 mg (04/22/2019), 150 mg (05/11/2019), 150 mg (05/12/2019), 150 mg (05/13/2019) fosaprepitant (EMEND) 150 mg, dexamethasone (DECADRON) 12 mg in sodium chloride 0.9 % 145 mL IVPB, , Intravenous,  Once, 4 of 4 cycles Administration:  (03/09/2019),  (03/30/2019),  (04/20/2019),  (05/11/2019)   for chemotherapy treatment.     03/09/2019 Cancer Staging   Staging form: Lung, AJCC 8th Edition - Clinical: Stage IIIA (cT3, cN1, cM0) - Signed by  Derek Jack, MD on 03/09/2019    12/15/2019 - 05/05/2020 Chemotherapy          06/06/2020 -  Chemotherapy    Patient is on Treatment Plan: LUNG SMALL CELL TOPOTECAN D1-5 Q21D         CANCER STAGING: Cancer Staging Small cell lung carcinoma, right ([facility]) Staging form: Lung, AJCC 8th Edition - Clinical: Stage IIIA (cT3, cN1, cM0) - Signed by Derek Jack, MD on 03/09/2019   INTERVAL HISTORY:  Ashley [name], a 70 y.o. female, returns for routine follow-up and consideration for next cycle of chemotherapy. Ashley [name] was last seen on 08/29/2020.  Due for cycle #6 of Topotecan  today.   Overall, she tells me she has been feeling okay. She reports increased fatigue and loss of appetite. She has been sleeping for the majority of the day as of last Friday. This fatigue is different and worsened from her baseline. She has increased levels of confusion, depression, and worsening hand-eye coordination. He nausea has improved since taking Compazine. Oxycodone has helped with her pain but is less effective as she has not been eating very much; she has been taking 1-2 pills every 6 hours. Neither stopping the nitroglycerine patch nor Compazine has helped with the fatigue. She denies SOB, but report mild cough in the morning.   REVIEW OF SYSTEMS:  Review of Systems  Constitutional:  Positive for appetite change (0%) and fatigue (depleted).  HENT:   Positive for mouth sores and trouble swallowing.   Eyes:  Positive for eye problems (poor hand-eye coordination).  Respiratory:  Positive for cough (mild). Negative for shortness of breath.   Cardiovascular:  Positive for leg swelling (ankles).  Gastrointestinal:  Positive for constipation and nausea.  Psychiatric/Behavioral:  Positive for confusion.   All other systems reviewed and are negative.  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 No evidence of ischemia/injury based upon normal troponin I. Chest discomfort could be tachycardia induced supply demand mismatch.    CHF (congestive heart failure) ([facility]) 11/17/2015   after surgery    Colon adenomas    Coronary  artery disease    DJD (degenerative joint disease)    Dyspnea    with exertion   Emphysema of lung ([facility]) 11/17/2015   Essential hypertension, benign    GERD (gastroesophageal reflux disease)    History of hiatal hernia    Hyperlipidemia    Hypertension    Melanoma ([facility]) 01/09/2016   Osteopenia    Palpitations    Pernicious anemia 03/06/2016   Pernicious anemia    Pure hypercholesterolemia    Raynaud's disease    Thrombocythemia, essential ([facility]) 01/09/2016   Thrombocytosis    Idiopathic   Vitamin D deficiency    Past Surgical History:  Procedure Laterality Date   ABDOMINAL HYSTERECTOMY     ANTERIOR AND POSTERIOR REPAIR N/A 12/09/2014   Procedure: ANTERIOR (CYSTOCELE) AND POSTERIOR REPAIR (RECTOCELE);  Surgeon: Bjorn Loser, MD;  Location: [facility];  Service: Urology;  Laterality: N/A;   ANTERIOR CERVICAL DECOMPRESSION/DISCECTOMY FUSION 4 LEVELS Right 10/03/2016   Procedure: ANTERIOR CERVICAL DECOMPRESSION FUSION, CERVICAL 4-5, CERVICAL 5-6, CERVICAL 6-7, CERVICAL 7 TO THORACIC 1 WITH INSTRUMENTATION AND ALLOGRAFT;  Surgeon: Phylliss Bob, MD;  Location:  [facility];  Service: Orthopedics;  Laterality: Right;  ANTERIOR CERVICAL DECOMPRESSION FUSION, CERVICAL 4-5, CERVICAL 5-6, CERVICAL 6-7, CERVICAL 7 TO THORACIC 1 WITH INSTRUMENTATION AND ALLOGRAFT; REQUEST 4 HO   ANTERIOR LAT LUMBAR FUSION Left 11/16/2015   Procedure: LEFT SIDED LATERAL INTERBODY FUSION, LUMBAR 2-3, LUMBAR 3-4, LUMBAR 4-5 WITH INSTRUMENTATION;  Surgeon: Phylliss Bob, MD;  Location: [facility];  Service: Orthopedics;  Laterality: Left;  LEFT SIDED LATEARL INTERBODY FUSION, LUMBAR 2-3, LUMBAR 3-4, LUMBAR 4-5 WITH INSTRUMENTATION    APPENDECTOMY     BACK SURGERY     BONE MARROW ASPIRATION  07/2012   BONE MARROW BIOPSY  07/2012   BREAST SURGERY     CARDIAC CATHETERIZATION     CARDIAC CATHETERIZATION N/A 01/20/2016   Procedure: Left Heart Cath and Coronary Angiography;  Surgeon: Burnell Blanks, MD;  Location: [facility];  Service: Cardiovascular;  Laterality: N/A;   COLONOSCOPY  11/29/2010   Procedure: COLONOSCOPY;  Surgeon: Rogene Houston, MD;  [facility];  Service: Endoscopy;  Laterality: N/A;   COLONOSCOPY N/A 02/18/2014   Procedure: COLONOSCOPY;  Surgeon: Rogene Houston, MD;  [facility];  Service: Endoscopy;  Laterality: N/A;  1030   COLONOSCOPY N/A 02/25/2017   Procedure: COLONOSCOPY;  Surgeon: Rogene Houston, MD;  [facility];  Service: Endoscopy;  Laterality: N/A;  10:55   CYSTOSCOPY N/A 12/09/2014   Procedure: CYSTOSCOPY;  Surgeon: Bjorn Loser, MD;  Location: [facility];  Service: Urology;  Laterality: N/A;   ESOPHAGEAL DILATION N/A 02/25/2017   Procedure: ESOPHAGEAL DILATION;  Surgeon: Rogene Houston, MD;  [facility];  Service: Endoscopy;  Laterality: N/A;   ESOPHAGOGASTRODUODENOSCOPY N/A 02/25/2017   Procedure: ESOPHAGOGASTRODUODENOSCOPY (EGD);  Surgeon: Rogene Houston, MD;  [facility];  Service: Endoscopy;  Laterality: N/A;   IR GENERIC HISTORICAL  01/11/2016   IR RADIOLOGY PERIPHERAL GUIDED IV START 01/11/2016 Saverio Danker, PA-C [facility]   IR GENERIC HISTORICAL  01/11/2016   IR US GUIDE VASC ACCESS RIGHT 01/11/2016 Saverio Danker, PA-C [facility]   LYMPH NODE DISSECTION Right 12/30/2018   Procedure: Lymph Node Dissection;  Surgeon: Lajuana Matte, MD;  Location: [facility];  Service: Thoracic;  Laterality: Right;   MASTECTOMY     left   OVARIAN CYST SURGERY     x2   POLYPECTOMY  02/25/2017   Procedure: POLYPECTOMY;  Surgeon: Rogene Houston, MD;  [facility];  Service: Endoscopy;;   PORTACATH PLACEMENT Left 02/16/2019   Procedure: INSERTION PORT-A-CATH (CATHETER  LEFT SUBCLAVIAN);  Surgeon: Aviva Signs, MD;  Location: [facility];  Service: General;  Laterality: Left;   SALPINGOOPHORECTOMY Bilateral 12/09/2014   Procedure: SALPINGO OOPHORECTOMY;  Surgeon: Servando Salina, MD;  Location: [facility];  Service:  Gynecology;  Laterality: Bilateral;   TUBAL LIGATION     VAGINAL HYSTERECTOMY N/A 12/09/2014   Procedure: HYSTERECTOMY VAGINAL;  Surgeon: Servando Salina, MD;  Location: [facility];  Service: Gynecology;  Laterality: N/A;   VIDEO ASSISTED THORACOSCOPY (VATS)/ LOBECTOMY Right 12/30/2018   Procedure: VIDEO ASSISTED THORACOSCOPY (VATS)/RIGHT LOWER LOBE WEDGE RESECTION, RIGHT UPPER LOBECTOMY;  Surgeon: Lajuana Matte, MD;  Location: [facility];  Service: Thoracic;  Laterality: Right;   VIDEO BRONCHOSCOPY N/A 12/30/2018   Procedure: VIDEO BRONCHOSCOPY;  Surgeon: Lajuana Matte, MD;  [facility];  Service: Thoracic;  Laterality: N/A;    SOCIAL HISTORY:  Social History   Socioeconomic History   Marital status: Divorced  Spouse name: Not on file   Number of children: 2   Years of education: Not on file   Highest education level: Not on file  Occupational History   Not on file  Tobacco Use   Smoking status: Former    Packs/day: 0.50    Years: 50.00    Pack years: 25.00    Types: Cigarettes    Quit date: 11/25/2018    Years since quitting: 1.8   Smokeless tobacco: Never  Vaping Use   Vaping Use: Never used  Substance and Sexual Activity   Alcohol use: Yes    Comment: occasional   Drug use: No   Sexual activity: Not Currently    Birth control/protection: Post-menopausal  Other Topics Concern   Not on file  Social History Narrative   Not on file   Social Determinants of Health   Financial Resource Strain: Low Risk    Difficulty of Paying Living Expenses: Not hard at all  Food Insecurity: No Food Insecurity   Worried About Charity fundraiser in the Last Year: Never true   [place] in the Last Year: Never true  Transportation Needs: No Transportation Needs   Lack of Transportation (Medical): No   Lack of Transportation (Non-Medical): No  Physical Activity: Insufficiently Active   Days of Exercise per Week: 4 days   Minutes of Exercise per Session: 30 min   Stress: No Stress Concern Present   Feeling of Stress : Not at all  Social Connections: Moderately Isolated   Frequency of Communication with Friends and Family: More than three times a week   Frequency of Social Gatherings with Friends and Family: More than three times a week   Attends Religious Services: More than 4 times per year   Active Member of [employer] or Organizations: No   Attends Music therapist: Never   Marital Status: Divorced  Human resources officer Violence: Not At Risk   Fear of Current or Ex-Partner: No   Emotionally Abused: No   Physically Abused: No   Sexually Abused: No    FAMILY HISTORY:  Family History  Problem Relation Age of Onset   Hypertension Mother    Heart failure Mother    Congestive Heart Failure Mother    COPD Mother    Pernicious anemia Mother    Cancer Mother        lung   Hypertension Father    CAD Father    Heart attack Father    Hypertension Sister    Cancer Other    Celiac disease Other     CURRENT MEDICATIONS:  Current Outpatient Medications  Medication Sig Dispense Refill   albuterol (PROVENTIL HFA;VENTOLIN HFA) 108 (90 Base) MCG/ACT inhaler Inhale 2 puffs into the lungs every 6 (six) hours as needed for wheezing or shortness of breath. 1 Inhaler 2   albuterol (PROVENTIL) (2.5 MG/3ML) 0.083% nebulizer solution Take 2.5 mg by nebulization every 6 (six) hours as needed for wheezing or shortness of breath.     ALPRAZolam (XANAX) 0.5 MG tablet Take one tablet prior to MRIs and/or radiation oncology procedures. 6 tablet 0   aspirin EC 81 MG tablet Take 81 mg by mouth daily.     Calcium Carb-Cholecalciferol 600-800 MG-UNIT TABS Take 1 tablet by mouth 2 (two) times daily.     Cholecalciferol (VITAMIN D-3) 1000 units CAPS Take 1,000 Units daily by mouth.      clonazePAM (KLONOPIN) 0.5 MG tablet Take 1 tablet (0.5 mg total)  by mouth at bedtime. Take 1 and half tablets by mouth at bedtime. 45 tablet 4   cyanocobalamin (,VITAMIN B-12,)  1000 MCG/ML injection INJECT 1 ML INTO THE MUSCLE ONCE MONTHLY AS DIRECTED. (Patient taking differently: Inject 1,000 mcg into the muscle every 30 (thirty) days.) 1 mL 5   dexamethasone (DECADRON) 2 MG tablet Take 1 tablet (2 mg total) by mouth daily. (Patient taking differently: Take 2 mg by mouth every other day.) 60 tablet 3   diclofenac Sodium (VOLTAREN) 1 % GEL Apply 2 g topically 4 (four) times daily as needed (pain).     gabapentin (NEURONTIN) 600 MG tablet Take 1 tablet (600 mg total) by mouth daily. 30 tablet 6   guaiFENesin (MUCINEX) 600 MG 12 hr tablet Take 1 tablet (600 mg total) by mouth 2 (two) times daily as needed for cough or to loosen phlegm. (Patient taking differently: Take 600 mg by mouth 2 (two) times daily.)     ibandronate (BONIVA) 150 MG tablet Take 150 mg by mouth every 30 (thirty) days.      ibuprofen (ADVIL) 800 MG tablet Take 1 tablet (800 mg total) by mouth every 8 (eight) hours as needed for moderate pain. 30 tablet 6   ipratropium (ATROVENT) 0.02 % nebulizer solution Take 0.5 mg by nebulization every 6 (six) hours as needed for wheezing or shortness of breath.     ipratropium-albuterol (DUONEB) 0.5-2.5 (3) MG/3ML SOLN Take 3 mLs by nebulization every 6 (six) hours as needed (shortness of breath). 360 mL 0   lidocaine-prilocaine (EMLA) cream Apply a small amount to port a cath site and cover with plastic wrap 1 hour prior to chemotherapy appointments 30 g 3   magic mouthwash w/lidocaine SOLN 10 cc swab and swallow (Patient taking differently: Take 10 mLs by mouth daily as needed for mouth pain. swab and swallow) 240 mL 1   methocarbamol (ROBAXIN) 500 MG tablet Take 500 mg by mouth daily.     mirtazapine (REMERON) 15 MG tablet TAKE (1) TABLET BY MOUTH AT BEDTIME. (Patient taking differently: Take 15 mg by mouth at bedtime.) 30 tablet 5   nitroGLYCERIN (NITRODUR - DOSED IN MG/24 HR) 0.1 mg/hr patch Place 0.1 mg onto the skin daily.     ondansetron (ZOFRAN-ODT) 8 MG  disintegrating tablet TAKE (1) TABLET BY MOUTH EVERY EIGHT HOURS AS NEEDED FOR NAUSEA OR VOMITING 60 tablet 0   oxyCODONE (OXY IR/ROXICODONE) 5 MG immediate release tablet Take 1 tablet (5 mg total) by mouth every 8 (eight) hours. 15 tablet 0   OXYGEN Inhale 3 L into the lungs daily as needed (SOB).     pantoprazole (PROTONIX) 40 MG tablet TAKE ONE TABLET BY MOUTH TWICE DAILY. (Patient taking differently: Take 40 mg by mouth 2 (two) times daily.) 60 tablet 0   polyethylene glycol (MIRALAX / GLYCOLAX) 17 g packet Take 17 g by mouth daily as needed for mild constipation.     potassium chloride SA (KLOR-CON) 20 MEQ tablet Take 1 tablet (20 mEq total) by mouth 3 (three) times daily. 90 tablet 2   prochlorperazine (COMPAZINE) 10 MG tablet 1 tablet as needed (Patient taking differently: Take 10 mg by mouth daily. At night) 30 tablet 2   rizatriptan (MAXALT) 10 MG tablet Take 10 mg by mouth 2 (two) times daily. 1-2 times daily May repeat in 2 hours if needed     rosuvastatin (CRESTOR) 40 MG tablet Take 40 mg by mouth at bedtime.     sucralfate (CARAFATE) 1  g tablet Take 1 g by mouth 3 (three) times daily as needed (upset stomach).     traMADol (ULTRAM) 50 MG tablet Take 50 mg by mouth daily.     No current facility-administered medications for this visit.   Facility-Administered Medications Ordered in Other Visits  Medication Dose Route Frequency Provider Last Rate Last Admin   sodium chloride flush (NS) 0.9 % injection 10 mL  10 mL Intravenous PRN Lockamy, Randi L, NP-C   10 mL at 12/22/19 1240    ALLERGIES:  Allergies  Allergen Reactions   Penicillins Hives and Rash    Did it involve swelling of the face/tongue/throat, SOB, or low BP? No Did it involve sudden or severe rash/hives, skin peeling, or any reaction on the inside of your mouth or nose? No Did you need to seek medical attention at a hospital or doctor's office? No When did it last happen?      5-10 year If all above answers are "NO",  may proceed with cephalosporin use.      PHYSICAL EXAM:  Performance status (ECOG): 1 - Symptomatic but completely ambulatory  There were no vitals filed for this visit. Wt Readings from Last 3 Encounters:  09/06/20 99 lb (44.9 kg)  08/29/20 99 lb 6.4 oz (45.1 kg)  08/19/20 103 lb 2 oz (46.8 kg)   Physical Exam Vitals reviewed.  Constitutional:      Appearance: Normal appearance.  HENT:     Mouth/Throat:     Mouth: Mucous membranes are moist. No oral lesions.     Dentition: No gum lesions.     Tongue: No lesions.     Comments: redness Cardiovascular:     Rate and Rhythm: Normal rate and regular rhythm.     Pulses: Normal pulses.     Heart sounds: Normal heart sounds.  Pulmonary:     Effort: Pulmonary effort is normal.     Breath sounds: Normal breath sounds.  Neurological:     General: No focal deficit present.     Mental Status: She is alert and oriented to person, place, and time.  Psychiatric:        Mood and Affect: Mood normal.        Behavior: Behavior normal.    LABORATORY DATA:  I have reviewed the labs as listed.  CBC Latest Ref Rng & Units 09/09/2020 09/07/2020 09/07/2020  WBC 4.0 - 10.5 K/uL 1.3(LL) - 1.0(LL)  Hemoglobin 12.0 - 15.0 g/dL 10.0(L) 9.6(L) 6.7(LL)  Hematocrit 36.0 - 46.0 % 29.3(L) 28.8(L) 21.4(L)  Platelets 150 - 400 K/uL 143(L) - 239   CMP Latest Ref Rng & Units 09/09/2020 09/07/2020 09/06/2020  Glucose 70 - 99 mg/dL 131(H) 115(H) 124(H)  BUN 8 - 23 mg/dL 23 38(H) 52(H)  Creatinine 0.44 - 1.00 mg/dL 0.73 1.21(H) 2.05(H)  Sodium 135 - 145 mmol/L 137 136 137  Potassium 3.5 - 5.1 mmol/L 3.1(L) 3.8 3.3(L)  Chloride 98 - 111 mmol/L 110 108 104  CO2 22 - 32 mmol/L _0 Calcium 8.9 - 10.3 mg/dL 8.2(L) 7.6(L) 8.4(L)  Total Protein 6.5 - 8.1 g/dL 4.8(L) 5.0(L) 6.3(L)  Total Bilirubin 0.3 - 1.2 mg/dL 0.4 0.5 0.5  Alkaline Phos 38 - 126 U/L 39 37(L) 46  AST 15 - 41 U/L 13(L) 12(L) 19  ALT 0 - 44 U/L _1 DIAGNOSTIC IMAGING:  I have  independently reviewed the scans and discussed with the patient. CT Head Wo Contrast  Result Date:  09/06/2020 CLINICAL DATA:  History of small cell lung cancer. Brain metastases. EXAM: CT HEAD WITHOUT CONTRAST TECHNIQUE: Contiguous axial images were obtained from the base of the skull through the vertex without intravenous contrast. COMPARISON:  October 01, 2017.  June 13, 2020. FINDINGS: Brain: Mild chronic ischemic white matter disease is noted. Metastatic lesion seen adjacent to the atrium of the right lateral ventricle on prior MRI is not well visualized on this unenhanced study. No definite mass effect or midline shift is noted. No hemorrhage or acute infarction is noted. Ventricular size is within normal limits. Vascular: No hyperdense vessel or unexpected calcification. Skull: Normal. Negative for fracture or focal lesion. Sinuses/Orbits: No acute finding. Other: Fluid is noted in the right mastoid air cells. IMPRESSION: Metastatic lesion seen adjacent to the atrium of right lateral ventricle on prior MRI of June 13, 2020 is not well visualized on this unenhanced study. Repeat MRI is recommended for evaluation of this lesion. No other significant intracranial abnormality is noted. Electronically Signed   By: Marijo Conception M.D.   On: 09/06/2020 16:23   MR ANGIO HEAD WO CONTRAST  Result Date: 09/07/2020 CLINICAL DATA:  Metastatic cancer. EXAM: MRA HEAD WITHOUT CONTRAST TECHNIQUE: Angiographic images of the Circle of Willis were acquired using MRA technique without intravenous contrast. COMPARISON:  MRI head same day.  CT head yesterday. FINDINGS: Anterior circulation: Both internal carotid arteries are patent through the skull base and siphon regions. The anterior and middle cerebral vessels show flow without evidence of proximal stenosis, aneurysm or vascular malformation. Posterior circulation: Both vertebral arteries are patent to the basilar. The right is dominant. No basilar stenosis.  Posterior circulation branch vessels show flow. Anatomic variants: None significant. Other: None IMPRESSION: Some motion degradation, but otherwise negative intracranial MR angiography of the large and medium sized vessels. Was the intention to order a contrast enhanced brain MRI for evaluation of the presumed intracranial metastatic lesions? Electronically Signed   By: Nelson Chimes M.D.   On: 09/07/2020 12:14   MR BRAIN WO CONTRAST  Result Date: 09/07/2020 CLINICAL DATA:  Altered mental status. Confusion. Metastatic lung cancer. EXAM: MRI HEAD WITHOUT CONTRAST TECHNIQUE: Multiplanar, multiecho pulse sequences of the brain and surrounding structures were obtained without intravenous contrast. COMPARISON:  Head CT 09/06/2020.  MRI 06/13/2020. FINDINGS: Brain: This examination was ordered without contrast, which hinders evaluation of metastatic brain disease. However, in this case, I think that small foci of restricted diffusion likely represent what would be enhancing brain lesions in will be enumerated below. No focal abnormality affects the brainstem or cerebellum. Previously seen treated lesion adjacent to the occipital horn of the right lateral ventricle with some regional edema has involuted considerably, consistent with successful treatment. Newly seen foci of restricted diffusion which are felt more likely represent metastatic lesions than multiple embolic infarctions are present as follows. Three punctate foci in the left occipital lobe diffusion images 15 through 17. 3 mm focus along the lateral margin of the occipital horn of the left lateral ventricle diffusion image 20. Punctate focus along the lateral margin of the occipital horn of the right lateral ventricle, possibly just above the previously treated lesion, image 23. 5 x 6 mm focus adjacent to the medial margin of the atrium of the left lateral ventricle image 25. 5 mm focus within the left anterior superior temporal lobe with mild regional  edema, image 26. Other small foci along the append mole margins of the lateral ventricle in numerous locations, most notable at  the posterior body of the left lateral ventricle image 30. 3 mm focus in the white matter of the left posterior frontal lobe image 33. We may, in this case, be dealing with a combination of new hematogenous metastatic lesions as well as potential ependymal spread of disease due to the previous ependymal involvement in the occipital horn of the right lateral ventricle. At some point, postcontrast imaging will help evaluate these lesions more completely. Vascular: Major vessels at the base of the brain show flow. Skull and upper cervical spine: No calvarial lesion. Previous C3 findings not well evaluated. Sinuses/Orbits: Paranasal sinuses are clear. Orbits are negative. Small left mastoid effusion extensive right mastoid effusion as seen previously. Other: None IMPRESSION: This study was ordered and performed without contrast. Favorable evolutionary changes of the previously treated lesion adjacent to the occipital horn of the right lateral ventricle. Resolution of edema in that area. New foci of restricted diffusion that I think represent metastatic lesions more likely than embolic infarctions, though there could always be a combination of both processes. These include parenchymal foci in the left occipital lobe, left anterior superior temporal lobe and left posterior frontal white matter. 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 At some point, postcontrast imaging would be useful to better understand the findings. Electronically Signed   By: Nelson Chimes M.D.   On: 09/07/2020 08:07   MR Brain W Wo Contrast  Result Date: 09/15/2020 CLINICAL DATA:  Brain/CNS neoplasm, surveillance. EXAM: MRI HEAD WITHOUT AND WITH CONTRAST TECHNIQUE: Multiplanar, multiecho pulse sequences of the  brain and surrounding structures were obtained without and with intravenous contrast. CONTRAST:  36m MULTIHANCE GADOBENATE DIMEGLUMINE 529 MG/ML IV SOLN COMPARISON:  MRI without contrast on Sep 07, 2020 and MRI with contrast on June 13, 2020. FINDINGS: Brain: Numerous new small areas of subependymal enhancement along the lateral ventricles bilaterally. For example, there are multiple nodular areas along the posterior aspect of the left lateral ventricle inferiorly (for example see series 12, image 65), along the inferior/anterolateral ventricles (see series 12, image 87), more superiorly on the right (series 12, image 96), along the medial aspect of the atrium of the left (series 12, image 84), and along the superior right lateral ventricle (series 12, image 108). The treated lesion along the occipital horn of the right lateral ventricle is decreased in size with linear subependymal enhancement seen (series 12, image 96). New enhancing cortical lesions in the left insula (5 mm on series 12, image 77), the left temporal lobe (8 mm on series 12, image 94), and in the high right parasagittal frontal lobe (series 12, image 117). Tiny foci of enhancement in the left occipital lobe in the region of previously seen restricted diffusion (series 12, images 49, 54) and tiny focus of restricted diffusion in the high left frontal white matter (series 3, image 70) without clear enhancement. Send clear fever represent small early metastases or infarcts. Recommend continued attention on short interval follow-up. Similar atrophy with ex vacuo ventricular dilation. No hydrocephalus. No substantial mass effect. No extra-axial fluid collection. Vascular: Major arterial flow voids are maintained at the skull base. Skull and upper cervical spine: Normal marrow signal. Sinuses/Orbits: Clear sinuses. Other: Large right and small left mastoid effusions IMPRESSION: 1. In comparison to prior contrast MRI from May 24, 2020, numerous  new small subependymal areas of enhancement along the lateral ventricles bilaterally and enhancing metastases in the left insular, left temporal,  and right frontal cortex, compatible with disease progression and ependymal spread. The treated lesion along the occipital horn of the right lateral ventricle is decreased in size. 2. Tiny foci of enhancement in the left occipital lobe in the region of previously seen restricted diffusion on recent May 25th MRI and similar tiny focus of restricted diffusion in the high left frontal white matter without clear enhancement. Given their small size, it is difficult to determine if these are additional small metastases versus evolving infarcts. Recommend continued attention on short interval follow-up. 3. Large right and small left mastoid effusions. Electronically Signed   By: Margaretha Sheffield MD   On: 09/15/2020 14:07   DG Chest Port 1 View  Result Date: 09/06/2020 CLINICAL DATA:  Questionable sepsis - evaluate for abnormality Confusion. EXAM: PORTABLE CHEST 1 VIEW COMPARISON:  Radiograph 08/21/2019.  CT 09/15/2019 FINDINGS: Accessed left chest port in place. Postsurgical volume loss in the right hemithorax with right tracheal deviation, unchanged. Upper normal heart size with aortic atherosclerosis and tortuosity. There is scarring in the periphery of the right mid lung, as well as apical pleuroparenchymal thickening. Patchy airspace opacities at the left lung base are new from prior exam. No pneumothorax or pleural effusion. Bones under mineralized. Surgical and lumbar fusion hardware. IMPRESSION: 1. Patchy airspace opacities at the left lung base, new from prior exam, suspicious for pneumonia. Recommend radiologic follow-up. 2. Stable postsurgical volume loss and scarring in the right hemithorax. Electronically Signed   By: Keith Rake M.D.   On: 09/06/2020 15:53     ASSESSMENT:  1.  Extensive stage small cell lung cancer: -Right upper lobectomy on 12/30/2018,  pathology-4.2 cm small cell lung cancer, invading visceral pleura, 1/3 lymph nodes positive, LVI positive, metastatic carcinoma in 211 or lymph nodes, right chest wall biopsy consistent with small cell carcinoma. -30 Gy of radiation in 10 fractions from 01/28/2019 through 02/10/2019. -4 cycles of carboplatin and etoposide from 03/09/2019 through 05/11/2019. -PCI completed on 07/31/2019. -We reviewed MRI of the brain on 09/15/2019 with no evidence of brain meta stasis.  Concern for hypointense appearance at the C3 vertebral body and left articular process. -CT chest on 09/15/2019 shows numerous new small solid irregular pulmonary nodules scattered throughout both lungs, largest 7 mm on the right lower lobe.  Tiny loculated anterior right pleural effusion decreased.  Stable mild subcarinal adenopathy.  Bilateral stable adrenal adenomas. -MRI of the C-spine on 10/13/2019 showed marrow edema and enhancement involving C3 vertebral body and left articular process without discrete bone lesion.  This was thought to be degenerative. -PET scan on 09/28/2019 shows bilateral lung nodules, below PET resolution.  Small right-sided nodular densities demonstrating low-level hypermetabolism.  Mild hypermetabolic corresponding to a normal-sized subcarinal lymph node.  Multifocal right pleural hypermetabolism in the setting of pleural thickening and prior right upper lobectomy, indeterminate.  No hypermetabolic extrathoracic disease. -PET scan on 11/24/2019 shows progressive increased soft tissue within the right lung along the suture margins, positive right paratracheal lymph node, small subcapsular focus of increased radiotracer uptake overlying the anterolateral right hepatic lobe concerning for tumor. -PET scan on 05/26/2020 with progression with bulky hypermetabolic subcarinal and right hilar lymphadenopathy.  Hypermetabolic lymph nodes in the right paratracheal and precarinal stations. - PET scan on 08/15/2020 showed subcarinal  lymph node improved to 2 cm from 2.7 cm.  13 mm right perihilar node, previously 18 mm.  No new findings.   PLAN:  1.  Extensive stage small cell lung cancer: - She was recently  hospitalized with neutropenic fever. - Today she is feeling low because of recent MRI findings. - She has some erythema and thrush on the tongue.  We will treat her with Diflucan for 5 days. - She will receive IV fluids.  I have reviewed her labs today which showed normal LFTs.  CBC was grossly normal. - We will hold off on treatment today.  We will plan to restart treatment next week if she feels better.   2.  Essential thrombocytosis: - Hydroxyurea on hold since chemotherapy started.  Platelet count is 661.   3.  Normocytic anemia: - Hemoglobin today improved to 12.3.   4.  Weight loss: - She will continue dexamethasone 2 mg in the mornings and Remeron daily.   5.  Mid back and sternal pain: - We will increase oxycodone to 5 mg 1 to 2 tablets daily every 6 hours as needed.   6.  Hypokalemia: - Potassium is 3.6.  Continue oral potassium supplements.   7.  Brain metastasis: - MRI of the brain on 09/15/2020 showed new lesions. - I have reviewed images with the patient and her daughter. - She was seen by Dr. Mickeal Skinner who recommended whole brain RT versus palliative care. - Patient is not ready for either at this time.  I have told her that systemic therapy is unlikely to help with brain lesions. - We plan to repeat MRI in 2 months from the last.   8.  Sleep problem: - Continue Klonopin as needed.  9.  Depression: - She has been depressed since her visit with Dr. Mickeal Skinner due to the findings on MRI. - We will start her on Cymbalta 20 mg daily.   Orders placed this encounter:  No orders of the defined types were placed in this encounter.    Derek Jack, MD [place] [phone]   I, Thana Ates, am acting as a scribe for Dr. Derek Jack.  I, Derek Jack MD,  have reviewed the above documentation for accuracy and completeness, and I agree with the above.

## 2020-09-23 ENCOUNTER — Other Ambulatory Visit (HOSPITAL_COMMUNITY): Payer: Self-pay | Admitting: *Deleted

## 2020-09-23 ENCOUNTER — Ambulatory Visit (HOSPITAL_COMMUNITY): Payer: Medicare Other

## 2020-09-23 ENCOUNTER — Other Ambulatory Visit: Payer: Medicare Other

## 2020-09-23 DIAGNOSIS — C3491 Malignant neoplasm of unspecified part of right bronchus or lung: Secondary | ICD-10-CM

## 2020-09-23 NOTE — Telephone Encounter (Signed)
Duplicate

## 2020-09-26 ENCOUNTER — Inpatient Hospital Stay (HOSPITAL_BASED_OUTPATIENT_CLINIC_OR_DEPARTMENT_OTHER): Payer: Medicare Other | Admitting: Hematology

## 2020-09-26 ENCOUNTER — Inpatient Hospital Stay (HOSPITAL_COMMUNITY): Payer: Medicare Other | Attending: Hematology

## 2020-09-26 ENCOUNTER — Ambulatory Visit: Payer: Medicare Other | Admitting: Internal Medicine

## 2020-09-26 ENCOUNTER — Inpatient Hospital Stay (HOSPITAL_COMMUNITY): Payer: Medicare Other

## 2020-09-26 ENCOUNTER — Other Ambulatory Visit (HOSPITAL_COMMUNITY): Payer: Self-pay | Admitting: *Deleted

## 2020-09-26 ENCOUNTER — Encounter (HOSPITAL_COMMUNITY): Payer: Self-pay | Admitting: Hematology

## 2020-09-26 ENCOUNTER — Other Ambulatory Visit: Payer: Self-pay

## 2020-09-26 VITALS — BP 128/90 | HR 66 | Temp 96.9°F | Resp 16

## 2020-09-26 VITALS — BP 133/88 | HR 84 | Temp 97.0°F | Resp 18 | Wt 93.0 lb

## 2020-09-26 DIAGNOSIS — Z836 Family history of other diseases of the respiratory system: Secondary | ICD-10-CM | POA: Diagnosis not present

## 2020-09-26 DIAGNOSIS — R110 Nausea: Secondary | ICD-10-CM | POA: Insufficient documentation

## 2020-09-26 DIAGNOSIS — Z801 Family history of malignant neoplasm of trachea, bronchus and lung: Secondary | ICD-10-CM | POA: Diagnosis not present

## 2020-09-26 DIAGNOSIS — Z85820 Personal history of malignant melanoma of skin: Secondary | ICD-10-CM | POA: Diagnosis not present

## 2020-09-26 DIAGNOSIS — Z87891 Personal history of nicotine dependence: Secondary | ICD-10-CM | POA: Diagnosis not present

## 2020-09-26 DIAGNOSIS — C7931 Secondary malignant neoplasm of brain: Secondary | ICD-10-CM | POA: Diagnosis not present

## 2020-09-26 DIAGNOSIS — R410 Disorientation, unspecified: Secondary | ICD-10-CM | POA: Diagnosis not present

## 2020-09-26 DIAGNOSIS — R630 Anorexia: Secondary | ICD-10-CM | POA: Diagnosis not present

## 2020-09-26 DIAGNOSIS — D531 Other megaloblastic anemias, not elsewhere classified: Secondary | ICD-10-CM

## 2020-09-26 DIAGNOSIS — Z853 Personal history of malignant neoplasm of breast: Secondary | ICD-10-CM | POA: Insufficient documentation

## 2020-09-26 DIAGNOSIS — M7989 Other specified soft tissue disorders: Secondary | ICD-10-CM | POA: Insufficient documentation

## 2020-09-26 DIAGNOSIS — C3491 Malignant neoplasm of unspecified part of right bronchus or lung: Secondary | ICD-10-CM | POA: Diagnosis not present

## 2020-09-26 DIAGNOSIS — Z79899 Other long term (current) drug therapy: Secondary | ICD-10-CM | POA: Diagnosis not present

## 2020-09-26 DIAGNOSIS — R059 Cough, unspecified: Secondary | ICD-10-CM | POA: Diagnosis not present

## 2020-09-26 DIAGNOSIS — Z8249 Family history of ischemic heart disease and other diseases of the circulatory system: Secondary | ICD-10-CM | POA: Insufficient documentation

## 2020-09-26 DIAGNOSIS — C3411 Malignant neoplasm of upper lobe, right bronchus or lung: Secondary | ICD-10-CM | POA: Insufficient documentation

## 2020-09-26 DIAGNOSIS — E876 Hypokalemia: Secondary | ICD-10-CM | POA: Diagnosis not present

## 2020-09-26 DIAGNOSIS — K5900 Constipation, unspecified: Secondary | ICD-10-CM | POA: Diagnosis not present

## 2020-09-26 DIAGNOSIS — Z8379 Family history of other diseases of the digestive system: Secondary | ICD-10-CM | POA: Insufficient documentation

## 2020-09-26 DIAGNOSIS — R5383 Other fatigue: Secondary | ICD-10-CM | POA: Insufficient documentation

## 2020-09-26 DIAGNOSIS — D473 Essential (hemorrhagic) thrombocythemia: Secondary | ICD-10-CM | POA: Diagnosis not present

## 2020-09-26 DIAGNOSIS — D649 Anemia, unspecified: Secondary | ICD-10-CM | POA: Diagnosis not present

## 2020-09-26 LAB — VITAMIN B12: Vitamin B-12: 890 pg/mL (ref 180–914)

## 2020-09-26 LAB — CBC WITH DIFFERENTIAL/PLATELET
Abs Immature Granulocytes: 0.19 10*3/uL — ABNORMAL HIGH (ref 0.00–0.07)
Basophils Absolute: 0.1 10*3/uL (ref 0.0–0.1)
Basophils Relative: 1 %
Eosinophils Absolute: 0 10*3/uL (ref 0.0–0.5)
Eosinophils Relative: 0 %
HCT: 38.6 % (ref 36.0–46.0)
Hemoglobin: 12.3 g/dL (ref 12.0–15.0)
Immature Granulocytes: 2 %
Lymphocytes Relative: 14 %
Lymphs Abs: 1.7 10*3/uL (ref 0.7–4.0)
MCH: 30.5 pg (ref 26.0–34.0)
MCHC: 31.9 g/dL (ref 30.0–36.0)
MCV: 95.8 fL (ref 80.0–100.0)
Monocytes Absolute: 1.5 10*3/uL — ABNORMAL HIGH (ref 0.1–1.0)
Monocytes Relative: 13 %
Neutro Abs: 8.2 10*3/uL — ABNORMAL HIGH (ref 1.7–7.7)
Neutrophils Relative %: 70 %
Platelets: 661 10*3/uL — ABNORMAL HIGH (ref 150–400)
RBC: 4.03 MIL/uL (ref 3.87–5.11)
RDW: 17.5 % — ABNORMAL HIGH (ref 11.5–15.5)
WBC: 11.7 10*3/uL — ABNORMAL HIGH (ref 4.0–10.5)
nRBC: 0 % (ref 0.0–0.2)

## 2020-09-26 LAB — COMPREHENSIVE METABOLIC PANEL
ALT: 9 U/L (ref 0–44)
AST: 17 U/L (ref 15–41)
Albumin: 3 g/dL — ABNORMAL LOW (ref 3.5–5.0)
Alkaline Phosphatase: 72 U/L (ref 38–126)
Anion gap: 6 (ref 5–15)
BUN: 12 mg/dL (ref 8–23)
CO2: 28 mmol/L (ref 22–32)
Calcium: 8.8 mg/dL — ABNORMAL LOW (ref 8.9–10.3)
Chloride: 103 mmol/L (ref 98–111)
Creatinine, Ser: 0.87 mg/dL (ref 0.44–1.00)
GFR, Estimated: >60 mL/min (ref >60)
Glucose, Bld: 119 mg/dL — ABNORMAL HIGH (ref 70–99)
Potassium: 3.6 mmol/L (ref 3.5–5.1)
Sodium: 137 mmol/L (ref 135–145)
Total Bilirubin: 0.6 mg/dL (ref 0.3–1.2)
Total Protein: 6.4 g/dL — ABNORMAL LOW (ref 6.5–8.1)

## 2020-09-26 LAB — IRON AND TIBC
Iron: 55 ug/dL (ref 28–170)
Saturation Ratios: 26 % (ref 10.4–31.8)
TIBC: 208 ug/dL — ABNORMAL LOW (ref 250–450)
UIBC: 153 ug/dL

## 2020-09-26 LAB — FERRITIN: Ferritin: 497 ng/mL — ABNORMAL HIGH (ref 11–307)

## 2020-09-26 LAB — MAGNESIUM: Magnesium: 1.8 mg/dL (ref 1.7–2.4)

## 2020-09-26 LAB — FOLATE: Folate: 8.9 ng/mL (ref >5.9)

## 2020-09-26 MED ORDER — GABAPENTIN 300 MG PO CAPS: 600.0000 mg | ORAL_CAPSULE | Freq: Every day | ORAL | 3 refills | Status: AC

## 2020-09-26 MED ORDER — DULOXETINE HCL 20 MG PO CPEP: 20.0000 mg | ORAL_CAPSULE | Freq: Every day | ORAL | 3 refills | Status: AC

## 2020-09-26 MED ORDER — OXYCODONE HCL 5 MG PO TABS: 5.0000 mg | ORAL_TABLET | ORAL | 0 refills | Status: DC | PRN

## 2020-09-26 MED ORDER — HEPARIN SOD (PORK) LOCK FLUSH 100 UNIT/ML IV SOLN
500.0000 [IU] | Freq: Once | INTRAVENOUS | Status: AC
  Administered 2020-09-26: 500 [IU] via INTRAVENOUS

## 2020-09-26 MED ORDER — SODIUM CHLORIDE 0.9% FLUSH
10.0000 mL | Freq: Once | INTRAVENOUS | Status: AC
  Administered 2020-09-26: 10 mL via INTRAVENOUS

## 2020-09-26 MED ORDER — FLUCONAZOLE 100 MG PO TABS: 100.0000 mg | ORAL_TABLET | Freq: Every day | ORAL | 0 refills | Status: AC

## 2020-09-26 MED ORDER — LORATADINE 10 MG PO TABS
ORAL_TABLET | ORAL | Status: AC
  Filled 2020-09-26: qty 1

## 2020-09-26 MED ORDER — OXYCODONE HCL 5 MG PO TABS: 5.0000 mg | ORAL_TABLET | Freq: Four times a day (QID) | ORAL | 0 refills | Status: AC | PRN

## 2020-09-26 MED ORDER — PROCHLORPERAZINE MALEATE 10 MG PO TABS: ORAL_TABLET | ORAL | 3 refills | Status: AC

## 2020-09-26 MED ORDER — ACETAMINOPHEN 325 MG PO TABS
ORAL_TABLET | ORAL | Status: AC
  Filled 2020-09-26: qty 2

## 2020-09-26 MED ORDER — SODIUM CHLORIDE 0.9 % IV SOLN: Freq: Once | INTRAVENOUS | Status: AC

## 2020-09-26 NOTE — Patient Instructions (Addendum)
[place] at [facility] Discharge Instructions  You were seen today by Dr. Delton Coombes. He went over your recent results. Your treatment was held today. You will be scheduled for an MRI of your brain in 2 months. Dr. Delton Coombes will see you back in 1 week for labs and follow up.   Thank you for choosing [place] at [facility] to provide your oncology and hematology care.  To afford each patient quality time with our provider, please arrive at least 15 minutes before your scheduled appointment time.   If you have a lab appointment with the [facility] please come in thru the Main Entrance and check in at the main information desk  You need to re-schedule your appointment should you arrive 10 or more minutes late.  We strive to give you quality time with our providers, and arriving late affects you and other patients whose appointments are after yours.  Also, if you no show three or more times for appointments you may be dismissed from the clinic at the providers discretion.     Again, thank you for choosing [facility].  Our hope is that these requests will decrease the amount of time that you wait before being seen by our physicians.       _____________________________________________________________  Should you have questions after your visit to [facility], please contact our office at [phone] between the hours of 8:00 a.m. and 4:30 p.m.  Voicemails left after 4:00 p.m. will not be returned until the following business day.  For prescription refill requests, have your pharmacy contact our office and allow 72 hours.    Cancer Center Support Programs:   > Cancer Support Group  2nd Tuesday of the month 1pm-2pm, Journey Room

## 2020-09-26 NOTE — Progress Notes (Signed)
Patient tolerated hydration with no complaints voiced.  Port site clean and dry with good blood return noted before and after hydration.  No bruising or swelling noted with port.  Band aid applied.  VSS with discharge and left ambulatory with no s/s of distress noted.

## 2020-09-26 NOTE — Patient Instructions (Signed)
[place]  Discharge Instructions: Thank you for choosing [place] to provide your oncology and hematology care.  If you have a lab appointment with the [place] [facility], please come in thru the Main Entrance and check in at the main information desk.  Wear comfortable clothing and clothing appropriate for easy access to any Portacath or PICC line.   We strive to give you quality time with your provider. You may need to reschedule your appointment if you arrive late (15 or more minutes).  Arriving late affects you and other patients whose appointments are after yours.  Also, if you miss three or more appointments without notifying the office, you may be dismissed from the clinic at the provider's discretion.      For prescription refill requests, have your pharmacy contact our office and allow 72 hours for refills to be completed.    Today you received hydration today.    To help prevent nausea and vomiting after your treatment, we encourage you to take your nausea medication as directed.  BELOW ARE SYMPTOMS THAT SHOULD BE REPORTED IMMEDIATELY: *FEVER GREATER THAN 100.4 F (38 C) OR HIGHER *CHILLS OR SWEATING *NAUSEA AND VOMITING THAT IS NOT CONTROLLED WITH YOUR NAUSEA MEDICATION *UNUSUAL SHORTNESS OF BREATH *UNUSUAL BRUISING OR BLEEDING *URINARY PROBLEMS (pain or burning when urinating, or frequent urination) *BOWEL PROBLEMS (unusual diarrhea, constipation, pain near the anus) TENDERNESS IN MOUTH AND THROAT WITH OR WITHOUT PRESENCE OF ULCERS (sore throat, sores in mouth, or a toothache) UNUSUAL RASH, SWELLING OR PAIN  UNUSUAL VAGINAL DISCHARGE OR ITCHING   Items with * indicate a potential emergency and should be followed up as soon as possible or go to the Emergency Department if any problems should occur.  Please show the CHEMOTHERAPY ALERT CARD or IMMUNOTHERAPY ALERT CARD at check-in to the Emergency Department and triage nurse.  Should you have  questions after your visit or need to cancel or reschedule your appointment, please contact [facility] [phone]  and follow the prompts.  Office hours are 8:00 a.m. to 4:30 p.m. Monday - Friday. Please note that voicemails left after 4:00 p.m. may not be returned until the following business day.  We are closed weekends and major holidays. You have access to a nurse at all times for urgent questions. Please call the main number to the clinic [phone] and follow the prompts.  For any non-urgent questions, you may also contact your provider using MyChart. We now offer e-Visits for anyone 26 and older to request care online for non-urgent symptoms. For details visit [email or web address].   Also download the MyChart app! Go to the app store, search "MyChart", open the app, select [place], and log in with your MyChart username and password.  Due to Covid, a mask is required upon entering the hospital/clinic. If you do not have a mask, one will be given to you upon arrival. For doctor visits, patients may have 1 support person aged 64 or older with them. For treatment visits, patients cannot have anyone with them due to current Covid guidelines and our immunocompromised population.

## 2020-09-26 NOTE — Progress Notes (Signed)
Patient has been assessed, vital signs and labs have been reviewed by Dr. Delton Coombes. ANC, Creatinine, LFTs, and Platelets are within treatment parameters per Dr. Delton Coombes. No treatment today.  NS 1 liter over two hours today and reschedule treatment for next week  Primary RN and pharmacy aware.

## 2020-09-27 ENCOUNTER — Ambulatory Visit (HOSPITAL_COMMUNITY): Payer: Medicare Other

## 2020-09-28 ENCOUNTER — Ambulatory Visit (HOSPITAL_COMMUNITY): Payer: Medicare Other

## 2020-09-29 ENCOUNTER — Encounter (HOSPITAL_COMMUNITY): Payer: Medicare Other | Admitting: Dietician

## 2020-09-29 ENCOUNTER — Ambulatory Visit (HOSPITAL_COMMUNITY): Payer: Medicare Other

## 2020-09-30 ENCOUNTER — Ambulatory Visit (HOSPITAL_COMMUNITY): Payer: Medicare Other

## 2020-10-03 ENCOUNTER — Inpatient Hospital Stay (HOSPITAL_COMMUNITY): Payer: Medicare Other

## 2020-10-03 ENCOUNTER — Other Ambulatory Visit (HOSPITAL_COMMUNITY): Payer: Self-pay

## 2020-10-03 ENCOUNTER — Other Ambulatory Visit: Payer: Self-pay

## 2020-10-03 ENCOUNTER — Ambulatory Visit (HOSPITAL_COMMUNITY): Payer: Medicare Other | Admitting: Hematology

## 2020-10-03 VITALS — BP 130/87 | HR 78 | Temp 97.2°F | Resp 18 | Wt 87.7 lb

## 2020-10-03 DIAGNOSIS — C3491 Malignant neoplasm of unspecified part of right bronchus or lung: Secondary | ICD-10-CM

## 2020-10-03 DIAGNOSIS — D510 Vitamin B12 deficiency anemia due to intrinsic factor deficiency: Secondary | ICD-10-CM

## 2020-10-03 DIAGNOSIS — C3411 Malignant neoplasm of upper lobe, right bronchus or lung: Secondary | ICD-10-CM | POA: Diagnosis not present

## 2020-10-03 DIAGNOSIS — N1831 Chronic kidney disease, stage 3a: Secondary | ICD-10-CM

## 2020-10-03 LAB — COMPREHENSIVE METABOLIC PANEL
ALT: 6 U/L (ref 0–44)
AST: 18 U/L (ref 15–41)
Albumin: 3 g/dL — ABNORMAL LOW (ref 3.5–5.0)
Alkaline Phosphatase: 75 U/L (ref 38–126)
Anion gap: 11 (ref 5–15)
BUN: 11 mg/dL (ref 8–23)
CO2: 25 mmol/L (ref 22–32)
Calcium: 9.2 mg/dL (ref 8.9–10.3)
Chloride: 99 mmol/L (ref 98–111)
Creatinine, Ser: 0.96 mg/dL (ref 0.44–1.00)
GFR, Estimated: >60 mL/min (ref >60)
Glucose, Bld: 111 mg/dL — ABNORMAL HIGH (ref 70–99)
Potassium: 3.1 mmol/L — ABNORMAL LOW (ref 3.5–5.1)
Sodium: 135 mmol/L (ref 135–145)
Total Bilirubin: 0.7 mg/dL (ref 0.3–1.2)
Total Protein: 6.5 g/dL (ref 6.5–8.1)

## 2020-10-03 LAB — CBC WITH DIFFERENTIAL/PLATELET
Abs Immature Granulocytes: 0.14 10*3/uL — ABNORMAL HIGH (ref 0.00–0.07)
Basophils Absolute: 0.1 10*3/uL (ref 0.0–0.1)
Basophils Relative: 1 %
Eosinophils Absolute: 0.1 10*3/uL (ref 0.0–0.5)
Eosinophils Relative: 1 %
HCT: 39.8 % (ref 36.0–46.0)
Hemoglobin: 12.6 g/dL (ref 12.0–15.0)
Immature Granulocytes: 1 %
Lymphocytes Relative: 16 %
Lymphs Abs: 2.8 10*3/uL (ref 0.7–4.0)
MCH: 30.3 pg (ref 26.0–34.0)
MCHC: 31.7 g/dL (ref 30.0–36.0)
MCV: 95.7 fL (ref 80.0–100.0)
Monocytes Absolute: 1.8 10*3/uL — ABNORMAL HIGH (ref 0.1–1.0)
Monocytes Relative: 10 %
Neutro Abs: 12.8 10*3/uL — ABNORMAL HIGH (ref 1.7–7.7)
Neutrophils Relative %: 71 %
Platelets: 665 10*3/uL — ABNORMAL HIGH (ref 150–400)
RBC: 4.16 MIL/uL (ref 3.87–5.11)
RDW: 16.3 % — ABNORMAL HIGH (ref 11.5–15.5)
WBC: 17.7 10*3/uL — ABNORMAL HIGH (ref 4.0–10.5)
nRBC: 0 % (ref 0.0–0.2)

## 2020-10-03 LAB — MAGNESIUM: Magnesium: 1.8 mg/dL (ref 1.7–2.4)

## 2020-10-03 MED ORDER — SODIUM CHLORIDE 0.9% FLUSH
10.0000 mL | Freq: Once | INTRAVENOUS | Status: AC | PRN
  Administered 2020-10-03: 10 mL

## 2020-10-03 MED ORDER — HEPARIN SOD (PORK) LOCK FLUSH 100 UNIT/ML IV SOLN
500.0000 [IU] | Freq: Once | INTRAVENOUS | Status: AC | PRN
  Administered 2020-10-03: 500 [IU]

## 2020-10-03 MED ORDER — MISC. DEVICES MISC: 0 refills | Status: AC

## 2020-10-03 MED ORDER — POTASSIUM CHLORIDE IN NACL 20-0.9 MEQ/L-% IV SOLN
Freq: Once | INTRAVENOUS | Status: AC
Start: 2020-10-03 — End: 2020-10-03
  Filled 2020-10-03: qty 1000

## 2020-10-03 NOTE — Progress Notes (Signed)
Labs reviewed with MD today. Will hold treatment this week per MD. Will give Hydration fluids per MD and will have her come back this week if needed for fluids.

## 2020-10-03 NOTE — Progress Notes (Signed)
Patients port flushed without difficulty.  Good blood return noted with no bruising or swelling noted at site.  Stable during access and blood draw.  Patient to remain accessed for possible treatment.

## 2020-10-03 NOTE — Patient Instructions (Signed)
[facility]  Discharge Instructions: Thank you for choosing [facility] to provide your oncology and hematology care.  If you have a lab appointment with the Cancer Center, please come in thru the Main Entrance and check in at the main information desk.  Wear comfortable clothing and clothing appropriate for easy access to any Portacath or PICC line.   We strive to give you quality time with your provider. You may need to reschedule your appointment if you arrive late (15 or more minutes).  Arriving late affects you and other patients whose appointments are after yours.  Also, if you miss three or more appointments without notifying the office, you may be dismissed from the clinic at the provider's discretion.      For prescription refill requests, have your pharmacy contact our office and allow 72 hours for refills to be completed.        To help prevent nausea and vomiting after your treatment, we encourage you to take your nausea medication as directed.  BELOW ARE SYMPTOMS THAT SHOULD BE REPORTED IMMEDIATELY: *FEVER GREATER THAN 100.4 F (38 C) OR HIGHER *CHILLS OR SWEATING *NAUSEA AND VOMITING THAT IS NOT CONTROLLED WITH YOUR NAUSEA MEDICATION *UNUSUAL SHORTNESS OF BREATH *UNUSUAL BRUISING OR BLEEDING *URINARY PROBLEMS (pain or burning when urinating, or frequent urination) *BOWEL PROBLEMS (unusual diarrhea, constipation, pain near the anus) TENDERNESS IN MOUTH AND THROAT WITH OR WITHOUT PRESENCE OF ULCERS (sore throat, sores in mouth, or a toothache) UNUSUAL RASH, SWELLING OR PAIN  UNUSUAL VAGINAL DISCHARGE OR ITCHING   Items with * indicate a potential emergency and should be followed up as soon as possible or go to the Emergency Department if any problems should occur.  Please show the CHEMOTHERAPY ALERT CARD or IMMUNOTHERAPY ALERT CARD at check-in to the Emergency Department and triage nurse.  Should you have questions after your visit or need to cancel  or reschedule your appointment, please contact [facility] [phone]  and follow the prompts.  Office hours are 8:00 a.m. to 4:30 p.m. Monday - Friday. Please note that voicemails left after 4:00 p.m. may not be returned until the following business day.  We are closed weekends and major holidays. You have access to a nurse at all times for urgent questions. Please call the main number to the clinic [phone] and follow the prompts.  For any non-urgent questions, you may also contact your provider using MyChart. We now offer e-Visits for anyone 18 and older to request care online for non-urgent symptoms. For details visit [email or web address].   Also download the MyChart app! Go to the app store, search "MyChart", open the app, select [facility], and log in with your MyChart username and password.  Due to Covid, a mask is required upon entering the hospital/clinic. If you do not have a mask, one will be given to you upon arrival. For doctor visits, patients may have 1 support person aged 18 or older with them. For treatment visits, patients cannot have anyone with them due to current Covid guidelines and our immunocompromised population.  

## 2020-10-04 ENCOUNTER — Inpatient Hospital Stay (HOSPITAL_COMMUNITY): Payer: Medicare Other

## 2020-10-05 ENCOUNTER — Encounter (HOSPITAL_COMMUNITY): Payer: Self-pay

## 2020-10-05 ENCOUNTER — Inpatient Hospital Stay (HOSPITAL_COMMUNITY): Payer: Medicare Other

## 2020-10-05 ENCOUNTER — Encounter (HOSPITAL_COMMUNITY): Payer: Self-pay | Admitting: Lab

## 2020-10-05 ENCOUNTER — Other Ambulatory Visit: Payer: Self-pay

## 2020-10-05 VITALS — BP 109/50 | HR 101 | Temp 96.8°F | Resp 16

## 2020-10-05 DIAGNOSIS — N1831 Chronic kidney disease, stage 3a: Secondary | ICD-10-CM

## 2020-10-05 DIAGNOSIS — C3411 Malignant neoplasm of upper lobe, right bronchus or lung: Secondary | ICD-10-CM | POA: Diagnosis not present

## 2020-10-05 DIAGNOSIS — D510 Vitamin B12 deficiency anemia due to intrinsic factor deficiency: Secondary | ICD-10-CM

## 2020-10-05 DIAGNOSIS — C3491 Malignant neoplasm of unspecified part of right bronchus or lung: Secondary | ICD-10-CM

## 2020-10-05 MED ORDER — HEPARIN SOD (PORK) LOCK FLUSH 100 UNIT/ML IV SOLN
500.0000 [IU] | Freq: Once | INTRAVENOUS | Status: AC | PRN
  Administered 2020-10-05: 500 [IU]

## 2020-10-05 MED ORDER — POTASSIUM CHLORIDE IN NACL 20-0.9 MEQ/L-% IV SOLN
Freq: Once | INTRAVENOUS | Status: AC
Start: 2020-10-05 — End: 2020-10-05
  Filled 2020-10-05: qty 1000

## 2020-10-05 MED ORDER — SODIUM CHLORIDE 0.9% FLUSH
10.0000 mL | Freq: Once | INTRAVENOUS | Status: AC | PRN
  Administered 2020-10-05: 10 mL

## 2020-10-05 NOTE — Progress Notes (Signed)
Pt tolerated fluids well today without incidence.  Discharged in stable condition via wheelchair.  Vital signs stable.  AVS reviewed.  Stable during and after infusion.

## 2020-10-05 NOTE — Progress Notes (Unsigned)
Referral to [facility].  Records faxed on 6/22

## 2020-10-05 NOTE — Patient Instructions (Signed)
[place]  Discharge Instructions: Thank you for choosing [place] to provide your oncology and hematology care.  If you have a lab appointment with the [facility], please come in thru the Main Entrance and check in at the main information desk.  Wear comfortable clothing and clothing appropriate for easy access to any Portacath or PICC line.   We strive to give you quality time with your provider. You may need to reschedule your appointment if you arrive late (15 or more minutes).  Arriving late affects you and other patients whose appointments are after yours.  Also, if you miss three or more appointments without notifying the office, you may be dismissed from the clinic at the provider's discretion.      For prescription refill requests, have your pharmacy contact our office and allow 72 hours for refills to be completed.    Today you received fluids today.   To help prevent nausea and vomiting after your treatment, we encourage you to take your nausea medication as directed.  BELOW ARE SYMPTOMS THAT SHOULD BE REPORTED IMMEDIATELY: *FEVER GREATER THAN 100.4 F (38 C) OR HIGHER *CHILLS OR SWEATING *NAUSEA AND VOMITING THAT IS NOT CONTROLLED WITH YOUR NAUSEA MEDICATION *UNUSUAL SHORTNESS OF BREATH *UNUSUAL BRUISING OR BLEEDING *URINARY PROBLEMS (pain or burning when urinating, or frequent urination) *BOWEL PROBLEMS (unusual diarrhea, constipation, pain near the anus) TENDERNESS IN MOUTH AND THROAT WITH OR WITHOUT PRESENCE OF ULCERS (sore throat, sores in mouth, or a toothache) UNUSUAL RASH, SWELLING OR PAIN  UNUSUAL VAGINAL DISCHARGE OR ITCHING   Items with * indicate a potential emergency and should be followed up as soon as possible or go to the Emergency Department if any problems should occur.  Please show the CHEMOTHERAPY ALERT CARD or IMMUNOTHERAPY ALERT CARD at check-in to the Emergency Department and triage nurse.  Should you have questions  after your visit or need to cancel or reschedule your appointment, please contact [facility] [phone]  and follow the prompts.  Office hours are 8:00 a.m. to 4:30 p.m. Monday - Friday. Please note that voicemails left after 4:00 p.m. may not be returned until the following business day.  We are closed weekends and major holidays. You have access to a nurse at all times for urgent questions. Please call the main number to the clinic [phone] and follow the prompts.  For any non-urgent questions, you may also contact your provider using MyChart. We now offer e-Visits for anyone 33 and older to request care online for non-urgent symptoms. For details visit [email or web address].   Also download the MyChart app! Go to the app store, search "MyChart", open the app, select [facility], and log in with your MyChart username and password.  Due to Covid, a mask is required upon entering the hospital/clinic. If you do not have a mask, one will be given to you upon arrival. For doctor visits, patients may have 1 support person aged 68 or older with them. For treatment visits, patients cannot have anyone with them due to current Covid guidelines and our immunocompromised population.

## 2020-10-06 ENCOUNTER — Encounter (HOSPITAL_COMMUNITY): Payer: Medicare Other | Admitting: Dietician

## 2020-10-06 ENCOUNTER — Inpatient Hospital Stay (HOSPITAL_COMMUNITY): Payer: Medicare Other

## 2020-10-07 ENCOUNTER — Inpatient Hospital Stay (HOSPITAL_COMMUNITY): Payer: Medicare Other

## 2020-10-10 ENCOUNTER — Ambulatory Visit (HOSPITAL_COMMUNITY): Payer: Medicare Other | Admitting: Hematology

## 2020-10-10 ENCOUNTER — Ambulatory Visit (HOSPITAL_COMMUNITY): Payer: Medicare Other

## 2020-10-10 ENCOUNTER — Other Ambulatory Visit (HOSPITAL_COMMUNITY): Payer: Medicare Other

## 2020-10-11 ENCOUNTER — Ambulatory Visit (HOSPITAL_COMMUNITY): Payer: Medicare Other

## 2020-10-12 ENCOUNTER — Ambulatory Visit (HOSPITAL_COMMUNITY): Payer: Medicare Other

## 2020-10-13 ENCOUNTER — Ambulatory Visit (HOSPITAL_COMMUNITY): Payer: Medicare Other

## 2020-10-14 ENCOUNTER — Ambulatory Visit (HOSPITAL_COMMUNITY): Payer: Medicare Other

## 2020-10-14 DEATH — deceased

## 2020-10-24 ENCOUNTER — Ambulatory Visit (HOSPITAL_COMMUNITY): Payer: Medicare Other | Admitting: Hematology

## 2020-10-24 ENCOUNTER — Ambulatory Visit (HOSPITAL_COMMUNITY): Payer: Medicare Other

## 2020-10-24 ENCOUNTER — Other Ambulatory Visit (HOSPITAL_COMMUNITY): Payer: Medicare Other

## 2020-10-25 ENCOUNTER — Ambulatory Visit (HOSPITAL_COMMUNITY): Payer: Medicare Other

## 2020-10-26 ENCOUNTER — Ambulatory Visit (HOSPITAL_COMMUNITY): Payer: Medicare Other

## 2020-10-27 ENCOUNTER — Ambulatory Visit (HOSPITAL_COMMUNITY): Payer: Medicare Other

## 2020-10-28 ENCOUNTER — Ambulatory Visit (HOSPITAL_COMMUNITY): Payer: Medicare Other

## 2020-11-07 ENCOUNTER — Encounter: Payer: Self-pay | Admitting: Radiation Therapy

## 2021-03-11 IMAGING — MR MRI HEAD WITHOUT AND WITH CONTRAST
10 of 13 series · 31 of 48 positions shown · IV contrast (gadavist)
Comparison: Head CT 10/01/2017, PET/CT 10/24/2018

CLINICAL DATA: Malignant neoplasm of lung, unspecified laterality,
unspecified part of lung.

EXAM:
MRI HEAD WITHOUT AND WITH CONTRAST
TECHNIQUE: Multiplanar, multiecho pulse sequences of the brain and surrounding
structures were obtained without and with intravenous contrast.
CONTRAST:  4 mL Gadavist

[Series 2: T1 · sagittal · 5.0mm · 0.41mm/px · 2 of 20 slices shown (1 of 2)]
[im 1/20]
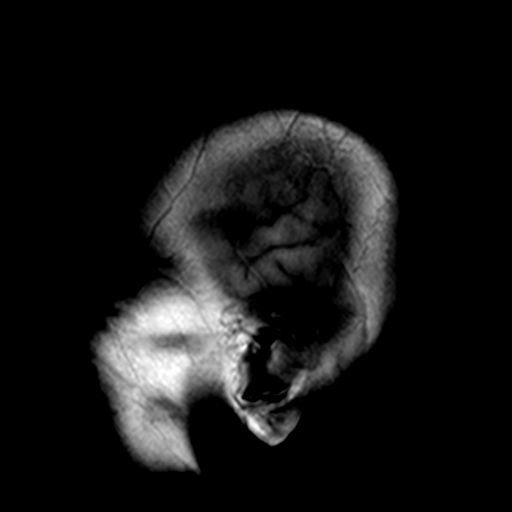
[im 20/20]
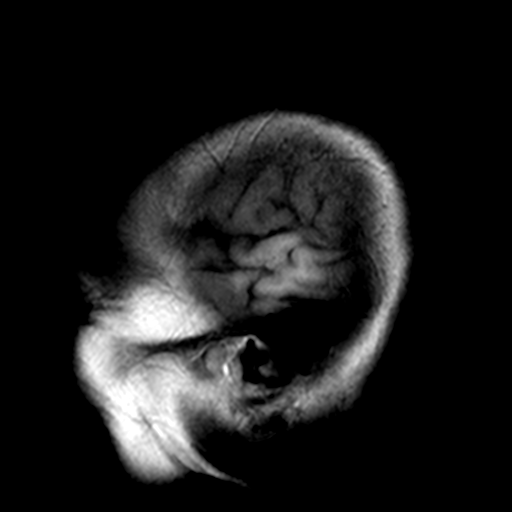

[Series 3: DWI · axial · 3.0mm · 0.72mm/px · z∈[-52,+110]mm · 5 of 55 slices shown (1 of 2)]
[im 1/55]
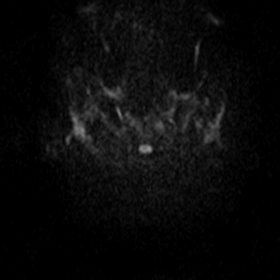
[im 14/55]
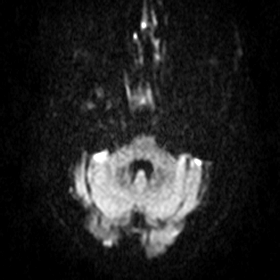
[im 28/55]
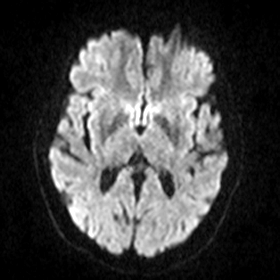
[im 41/55]
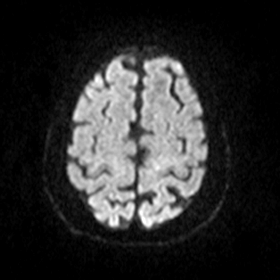
[im 55/55]
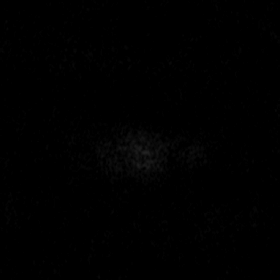

[Series 5: DWI · coronal · 5.0mm · 0.49mm/px · 3 of 34 slices shown (2 of 2)]
[im 1/34]
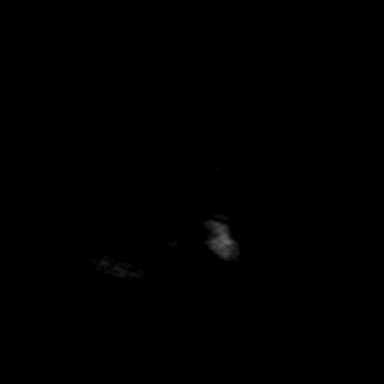
[im 17/34]
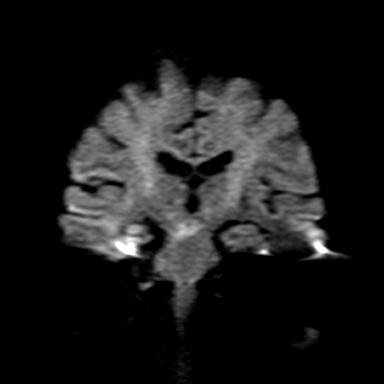
[im 34/34]
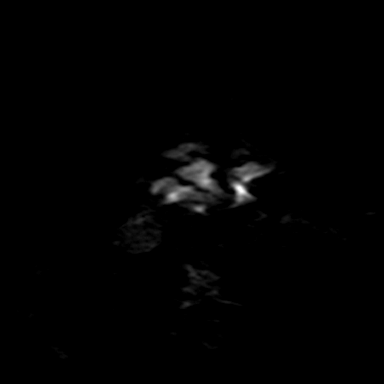

[Series 7: T2 · axial · 5.0mm · 0.70mm/px · z∈[-43,+100]mm · 2 of 23 slices shown (1 of 3)]
[im 1/23]
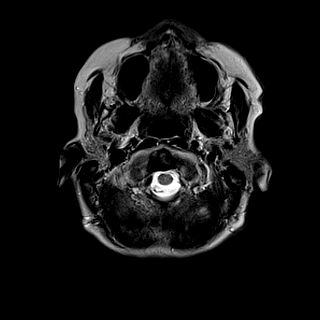
[im 23/23]
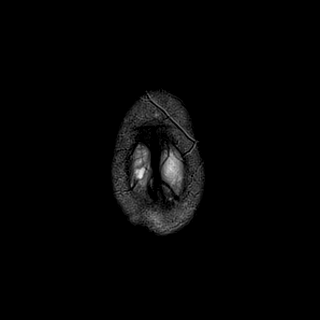

[Series 8: T2 · axial · 5.0mm · 0.42mm/px · z∈[-36,+94]mm · 2 of 21 slices shown (2 of 3)]
[im 1/21]
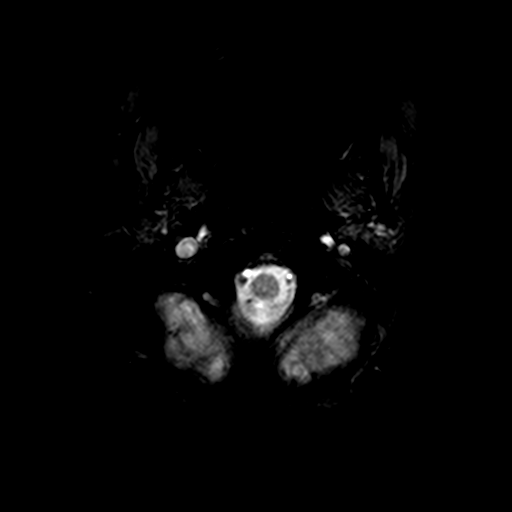
[im 21/21]
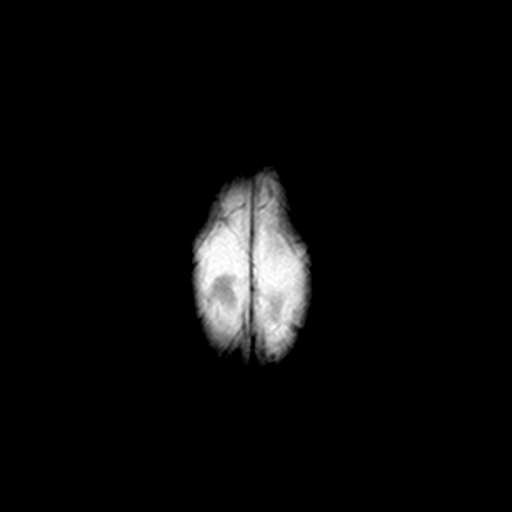

[Series 9: FLAIR · axial · 3.0mm · 0.79mm/px · z∈[-40,+98]mm · 4 of 47 slices shown]
[im 1/47]
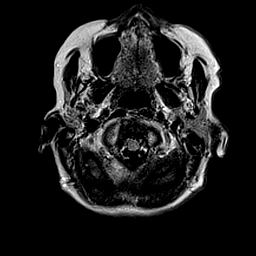
[im 16/47]
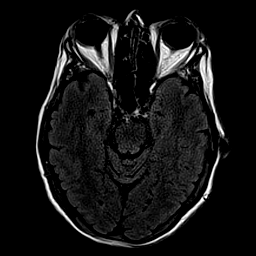
[im 31/47]
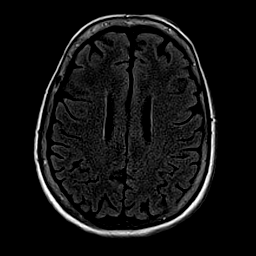
[im 47/47]
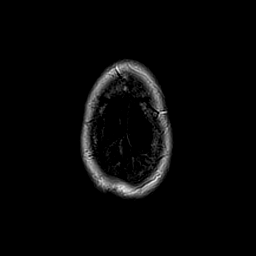

[Series 10: T1 · axial · 2.0mm · 0.41mm/px · 1 of 82 slices shown (2 of 2)]
[im 1/82]
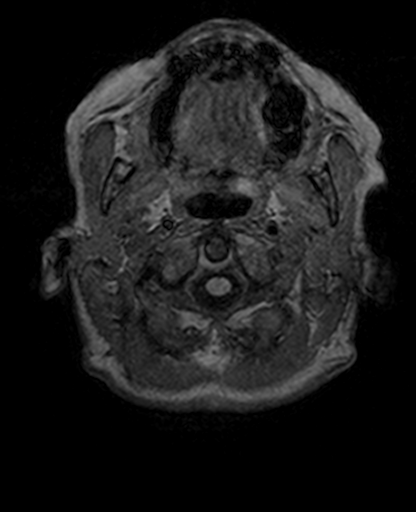

[Series 11: T2 · coronal · 5.0mm · 0.64mm/px · 3 of 28 slices shown (3 of 3)]
[im 1/28]
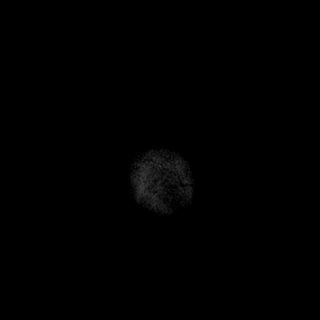
[im 14/28]
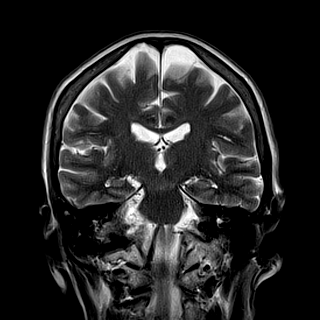
[im 28/28]
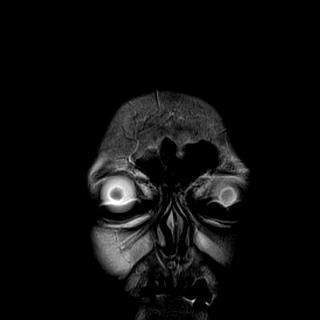

[Series 12: T1 post-contrast · axial · 2.0mm · 0.43mm/px · z∈[-50,+108]mm · 7 of 80 slices shown (1 of 2)]
[im 1/80]
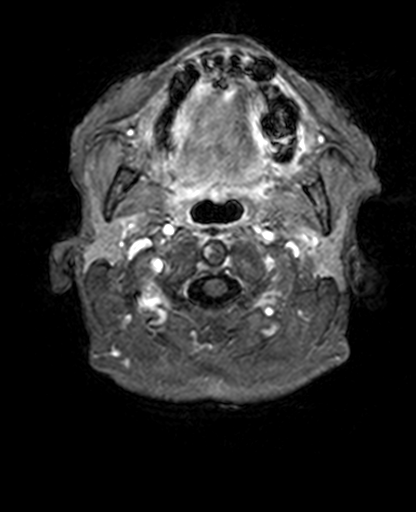
[im 14/80]
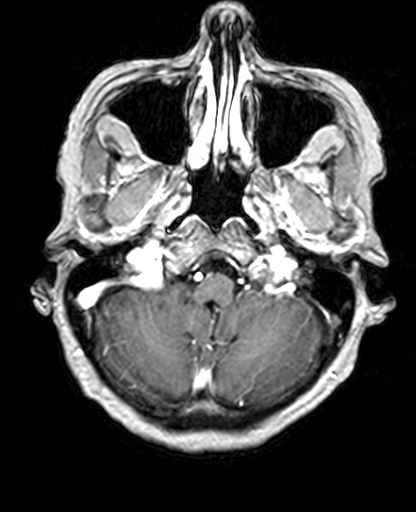
[im 27/80]
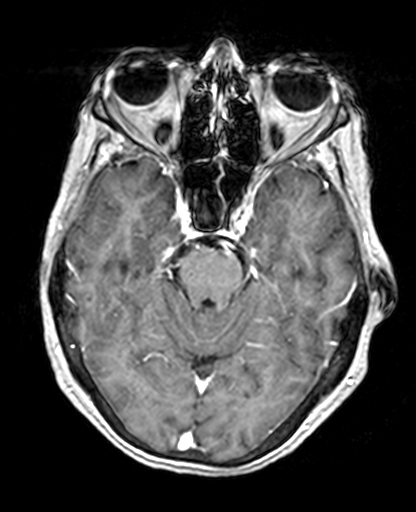
[im 40/80]
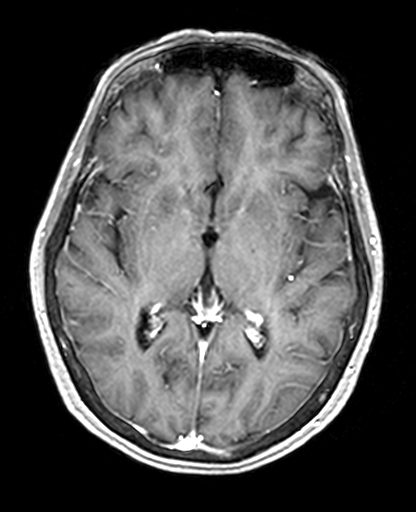
[im 53/80]
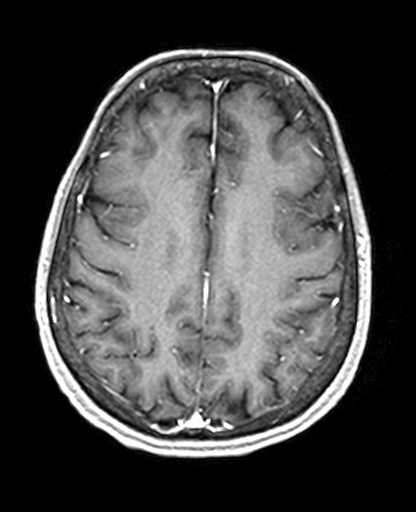
[im 66/80]
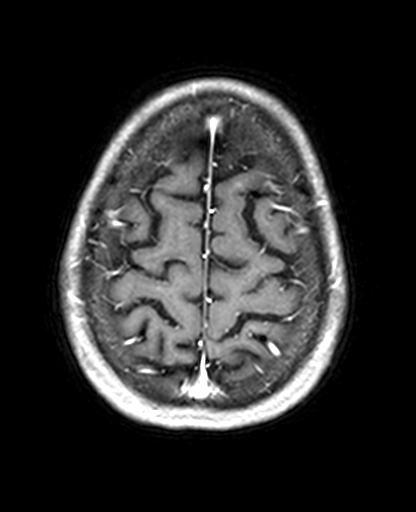
[im 80/80]
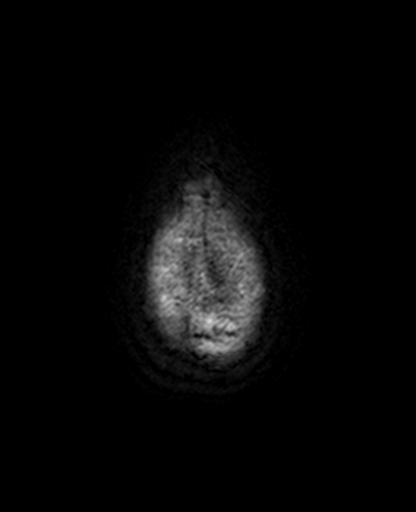

[Series 13: T1 post-contrast · coronal · 5.0mm · 0.40mm/px · 2 of 24 slices shown (2 of 2)]
[im 1/24]
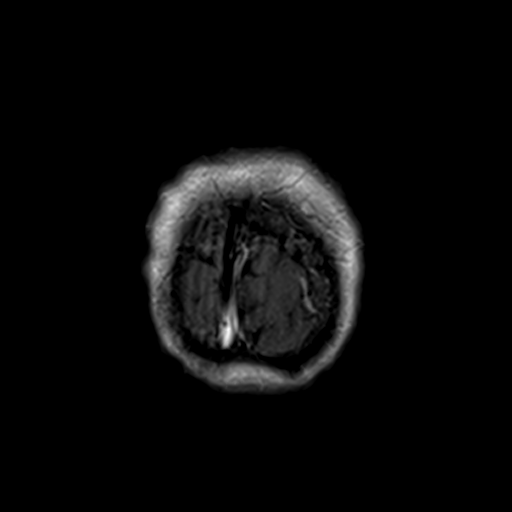
[im 24/24]
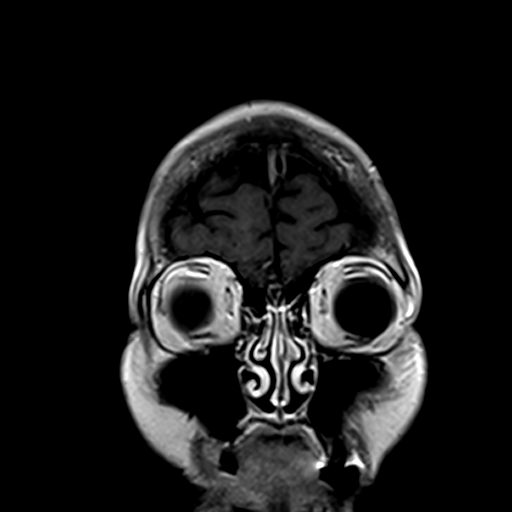

[31 of 48 positions shown; findings below may reference images not displayed]

FINDINGS: Brain: No enhancing intracranial lesions are demonstrated to suggest
intracranial metastatic disease. No evidence of acute infarct or
intracranial mass. No midline shift or extra-axial collection. No
evidence of chronic hemorrhage. There are few small scattered foci
of T2/FLAIR hyperintensity within the cerebral white matter which
are nonspecific, but consistent with chronic small vessel ischemic
disease. Cerebral volume is age appropriate.

Vascular: Flow voids maintained within the proximal large vessels.
Diminutive left sigmoid sinus.

Skull and upper cervical spine: Apparent T1 hypointensity within the
partially imaged C3 vertebra may reflect susceptibility artifact
from cervical spinal fusion hardware immediately beneath this level
(series 2, image 11). Otherwise unremarkable.

Sinuses/Orbits: Imaged globes and orbits are unremarkable. Trace
ethmoid sinus mucosal thickening. Small amount of fluid within the
bilateral mastoid air cells.
IMPRESSION: - No evidence of intracranial metastatic disease.

- A few small scattered signal changes within the cerebral white
matter are nonspecific, but consistent with chronic small vessel
ischemic disease.

- Apparent signal abnormality within the partially imaged C3
vertebra, although this may be related to susceptibility artifact
from spinal fusion hardware immediately beneath this level.
